# Patient Record
Sex: Male | Born: 1965 | Race: White | Hispanic: No | Marital: Married | Smoking: Never smoker
Health system: Southern US, Community
[De-identification: ages and names within clinical notes are randomized; demographics above are authoritative.]

## PROBLEM LIST (undated history)

## (undated) DIAGNOSIS — I5032 Chronic diastolic (congestive) heart failure: Secondary | ICD-10-CM

## (undated) DIAGNOSIS — E785 Hyperlipidemia, unspecified: Secondary | ICD-10-CM

## (undated) DIAGNOSIS — G629 Polyneuropathy, unspecified: Secondary | ICD-10-CM

## (undated) DIAGNOSIS — G4730 Sleep apnea, unspecified: Secondary | ICD-10-CM

## (undated) DIAGNOSIS — K7031 Alcoholic cirrhosis of liver with ascites: Principal | ICD-10-CM

## (undated) DIAGNOSIS — D649 Anemia, unspecified: Secondary | ICD-10-CM

## (undated) DIAGNOSIS — G4733 Obstructive sleep apnea (adult) (pediatric): Secondary | ICD-10-CM

## (undated) DIAGNOSIS — E119 Type 2 diabetes mellitus without complications: Secondary | ICD-10-CM

## (undated) DIAGNOSIS — T7840XA Allergy, unspecified, initial encounter: Secondary | ICD-10-CM

## (undated) DIAGNOSIS — D696 Thrombocytopenia, unspecified: Secondary | ICD-10-CM

## (undated) DIAGNOSIS — Z9989 Dependence on other enabling machines and devices: Secondary | ICD-10-CM

## (undated) DIAGNOSIS — N183 Chronic kidney disease, stage 3 (moderate): Secondary | ICD-10-CM

## (undated) DIAGNOSIS — L03116 Cellulitis of left lower limb: Secondary | ICD-10-CM

## (undated) DIAGNOSIS — F1020 Alcohol dependence, uncomplicated: Secondary | ICD-10-CM

## (undated) DIAGNOSIS — I10 Essential (primary) hypertension: Secondary | ICD-10-CM

## (undated) DIAGNOSIS — K219 Gastro-esophageal reflux disease without esophagitis: Secondary | ICD-10-CM

## (undated) HISTORY — DX: Thrombocytopenia, unspecified: D69.6

## (undated) HISTORY — DX: Allergy, unspecified, initial encounter: T78.40XA

## (undated) HISTORY — DX: Polyneuropathy, unspecified: G62.9

## (undated) HISTORY — DX: Dependence on other enabling machines and devices: Z99.89

## (undated) HISTORY — DX: Hyperlipidemia, unspecified: E78.5

## (undated) HISTORY — DX: Sleep apnea, unspecified: G47.30

## (undated) HISTORY — DX: Cellulitis of left lower limb: L03.116

## (undated) HISTORY — DX: Obstructive sleep apnea (adult) (pediatric): G47.33

## (undated) HISTORY — DX: Gastro-esophageal reflux disease without esophagitis: K21.9

## (undated) HISTORY — DX: Alcohol dependence, uncomplicated: F10.20

## (undated) HISTORY — DX: Chronic kidney disease, stage 3 (moderate): N18.3

## (undated) HISTORY — DX: Chronic diastolic (congestive) heart failure: I50.32

## (undated) HISTORY — DX: Essential (primary) hypertension: I10

## (undated) HISTORY — DX: Alcoholic cirrhosis of liver with ascites: K70.31

---

## 1995-06-28 HISTORY — PX: FINGER AMPUTATION: SHX636

## 1997-11-13 ENCOUNTER — Emergency Department (HOSPITAL_COMMUNITY): Admission: EM | Admit: 1997-11-13 | Discharge: 1997-11-13 | Payer: Self-pay | Admitting: Emergency Medicine

## 2003-08-26 ENCOUNTER — Encounter: Payer: Self-pay | Admitting: Internal Medicine

## 2004-08-18 ENCOUNTER — Ambulatory Visit: Payer: Self-pay | Admitting: Internal Medicine

## 2004-09-13 ENCOUNTER — Ambulatory Visit: Payer: Self-pay | Admitting: Internal Medicine

## 2004-12-14 ENCOUNTER — Ambulatory Visit: Payer: Self-pay | Admitting: Internal Medicine

## 2005-08-03 ENCOUNTER — Ambulatory Visit: Payer: Self-pay | Admitting: Internal Medicine

## 2006-01-16 ENCOUNTER — Ambulatory Visit: Payer: Self-pay | Admitting: Family Medicine

## 2006-02-20 ENCOUNTER — Ambulatory Visit: Payer: Self-pay | Admitting: Internal Medicine

## 2006-03-20 ENCOUNTER — Ambulatory Visit: Payer: Self-pay | Admitting: Internal Medicine

## 2006-06-22 ENCOUNTER — Ambulatory Visit: Payer: Self-pay | Admitting: Family Medicine

## 2006-07-19 ENCOUNTER — Ambulatory Visit: Payer: Self-pay | Admitting: Internal Medicine

## 2006-07-19 LAB — CONVERTED CEMR LAB
CO2: 30 meq/L (ref 19–32)
Calcium: 9.7 mg/dL (ref 8.4–10.5)
Chloride: 104 meq/L (ref 96–112)
Cholesterol: 272 mg/dL (ref 0–200)
Direct LDL: 174.4 mg/dL
GFR calc Af Amer: 87 mL/min
Glucose, Bld: 105 mg/dL — ABNORMAL HIGH (ref 70–99)
VLDL: 45 mg/dL — ABNORMAL HIGH (ref 0–40)

## 2006-08-06 ENCOUNTER — Emergency Department (HOSPITAL_COMMUNITY): Admission: EM | Admit: 2006-08-06 | Discharge: 2006-08-06 | Payer: Self-pay | Admitting: Emergency Medicine

## 2006-08-07 ENCOUNTER — Ambulatory Visit: Payer: Self-pay | Admitting: Internal Medicine

## 2006-09-19 ENCOUNTER — Ambulatory Visit: Payer: Self-pay | Admitting: Family Medicine

## 2006-10-03 ENCOUNTER — Ambulatory Visit: Payer: Self-pay | Admitting: Family Medicine

## 2006-10-12 ENCOUNTER — Ambulatory Visit: Payer: Self-pay | Admitting: Internal Medicine

## 2006-10-12 LAB — CONVERTED CEMR LAB
Albumin: 3.3 g/dL — ABNORMAL LOW (ref 3.5–5.2)
BUN: 11 mg/dL (ref 6–23)
Creatinine, Ser: 1.1 mg/dL (ref 0.4–1.5)
GFR calc Af Amer: 95 mL/min
GFR calc non Af Amer: 79 mL/min
Phosphorus: 3.2 mg/dL (ref 2.3–4.6)

## 2007-02-15 DIAGNOSIS — E1169 Type 2 diabetes mellitus with other specified complication: Secondary | ICD-10-CM | POA: Insufficient documentation

## 2007-02-15 DIAGNOSIS — J309 Allergic rhinitis, unspecified: Secondary | ICD-10-CM

## 2007-02-15 DIAGNOSIS — K219 Gastro-esophageal reflux disease without esophagitis: Secondary | ICD-10-CM | POA: Insufficient documentation

## 2007-02-15 DIAGNOSIS — E785 Hyperlipidemia, unspecified: Secondary | ICD-10-CM

## 2007-05-01 ENCOUNTER — Ambulatory Visit: Payer: Self-pay | Admitting: Internal Medicine

## 2007-05-01 DIAGNOSIS — F341 Dysthymic disorder: Secondary | ICD-10-CM

## 2007-05-01 DIAGNOSIS — L919 Hypertrophic disorder of the skin, unspecified: Secondary | ICD-10-CM

## 2007-05-01 DIAGNOSIS — L909 Atrophic disorder of skin, unspecified: Secondary | ICD-10-CM | POA: Insufficient documentation

## 2007-05-03 LAB — CONVERTED CEMR LAB
Albumin: 3.7 g/dL (ref 3.5–5.2)
Basophils Absolute: 0 10*3/uL (ref 0.0–0.1)
Cholesterol: 261 mg/dL (ref 0–200)
Eosinophils Absolute: 0.3 10*3/uL (ref 0.0–0.6)
GFR calc Af Amer: 95 mL/min
GFR calc non Af Amer: 79 mL/min
Glucose, Bld: 104 mg/dL — ABNORMAL HIGH (ref 70–99)
Hemoglobin: 15 g/dL (ref 13.0–17.0)
Lymphocytes Relative: 21.1 % (ref 12.0–46.0)
MCHC: 35.2 g/dL (ref 30.0–36.0)
MCV: 91.3 fL (ref 78.0–100.0)
Monocytes Absolute: 0.5 10*3/uL (ref 0.2–0.7)
Monocytes Relative: 8.9 % (ref 3.0–11.0)
Neutro Abs: 3.2 10*3/uL (ref 1.4–7.7)
Neutrophils Relative %: 64.1 % (ref 43.0–77.0)
Phosphorus: 3 mg/dL (ref 2.3–4.6)
Potassium: 5.1 meq/L (ref 3.5–5.1)
RDW: 12.1 % (ref 11.5–14.6)
Sodium: 138 meq/L (ref 135–145)
Total CHOL/HDL Ratio: 5.8

## 2007-07-31 ENCOUNTER — Ambulatory Visit: Payer: Self-pay | Admitting: Internal Medicine

## 2007-07-31 DIAGNOSIS — R740 Nonspecific elevation of levels of transaminase and lactic acid dehydrogenase [LDH]: Secondary | ICD-10-CM

## 2007-08-02 LAB — CONVERTED CEMR LAB
ALT: 94 units/L — ABNORMAL HIGH (ref 0–53)
AST: 57 units/L — ABNORMAL HIGH (ref 0–37)
BUN: 15 mg/dL (ref 6–23)
Bilirubin, Direct: 0.1 mg/dL (ref 0.0–0.3)
CO2: 27 meq/L (ref 19–32)
Creatinine, Ser: 1.3 mg/dL (ref 0.4–1.5)
GFR calc non Af Amer: 65 mL/min
HCV Ab: NEGATIVE
Hepatitis B Surface Ag: NEGATIVE
Phosphorus: 2.7 mg/dL (ref 2.3–4.6)
Potassium: 4.7 meq/L (ref 3.5–5.1)
Sodium: 137 meq/L (ref 135–145)
Total Bilirubin: 0.7 mg/dL (ref 0.3–1.2)

## 2007-10-22 ENCOUNTER — Ambulatory Visit: Payer: Self-pay | Admitting: Family Medicine

## 2008-04-09 ENCOUNTER — Ambulatory Visit: Payer: Self-pay | Admitting: Family Medicine

## 2008-04-09 DIAGNOSIS — F1010 Alcohol abuse, uncomplicated: Secondary | ICD-10-CM | POA: Insufficient documentation

## 2008-04-10 LAB — CONVERTED CEMR LAB
ALT: 98 units/L — ABNORMAL HIGH (ref 0–53)
AST: 61 units/L — ABNORMAL HIGH (ref 0–37)
Bilirubin, Direct: 0.2 mg/dL (ref 0.0–0.3)
CO2: 27 meq/L (ref 19–32)
Calcium: 9.3 mg/dL (ref 8.4–10.5)
Chloride: 104 meq/L (ref 96–112)
Glucose, Bld: 107 mg/dL — ABNORMAL HIGH (ref 70–99)
Sodium: 138 meq/L (ref 135–145)
Total Bilirubin: 0.8 mg/dL (ref 0.3–1.2)
Total Protein: 7.7 g/dL (ref 6.0–8.3)

## 2008-04-28 ENCOUNTER — Ambulatory Visit: Payer: Self-pay | Admitting: Internal Medicine

## 2008-07-24 ENCOUNTER — Telehealth: Payer: Self-pay | Admitting: Internal Medicine

## 2008-08-22 ENCOUNTER — Telehealth: Payer: Self-pay | Admitting: Internal Medicine

## 2008-08-29 ENCOUNTER — Ambulatory Visit: Payer: Self-pay | Admitting: Internal Medicine

## 2008-08-29 DIAGNOSIS — G479 Sleep disorder, unspecified: Secondary | ICD-10-CM | POA: Insufficient documentation

## 2008-09-15 ENCOUNTER — Telehealth: Payer: Self-pay | Admitting: Internal Medicine

## 2008-09-22 ENCOUNTER — Telehealth: Payer: Self-pay | Admitting: Internal Medicine

## 2008-09-22 ENCOUNTER — Ambulatory Visit: Payer: Self-pay | Admitting: Internal Medicine

## 2008-09-22 DIAGNOSIS — R600 Localized edema: Secondary | ICD-10-CM | POA: Insufficient documentation

## 2008-09-24 LAB — CONVERTED CEMR LAB
AST: 49 units/L — ABNORMAL HIGH (ref 0–37)
Albumin: 3.8 g/dL (ref 3.5–5.2)
Alkaline Phosphatase: 70 units/L (ref 39–117)
BUN: 13 mg/dL (ref 6–23)
Basophils Absolute: 0 10*3/uL (ref 0.0–0.1)
Bilirubin, Direct: 0.1 mg/dL (ref 0.0–0.3)
Calcium: 9.2 mg/dL (ref 8.4–10.5)
Creatinine, Ser: 1.2 mg/dL (ref 0.4–1.5)
Eosinophils Relative: 3.5 % (ref 0.0–5.0)
GFR calc non Af Amer: 70.56 mL/min (ref >60)
Glucose, Bld: 99 mg/dL (ref 70–99)
Lymphocytes Relative: 22.6 % (ref 12.0–46.0)
Lymphs Abs: 1.4 10*3/uL (ref 0.7–4.0)
Monocytes Relative: 8.1 % (ref 3.0–12.0)
Neutrophils Relative %: 65.4 % (ref 43.0–77.0)
Platelets: 122 10*3/uL — ABNORMAL LOW (ref 150.0–400.0)
RDW: 12.3 % (ref 11.5–14.6)
TSH: 1.28 microintl units/mL (ref 0.35–5.50)
Total Bilirubin: 0.7 mg/dL (ref 0.3–1.2)
WBC: 6.2 10*3/uL (ref 4.5–10.5)

## 2008-11-25 ENCOUNTER — Ambulatory Visit: Payer: Self-pay | Admitting: Internal Medicine

## 2008-11-25 DIAGNOSIS — M542 Cervicalgia: Secondary | ICD-10-CM

## 2008-11-25 DIAGNOSIS — F4389 Other reactions to severe stress: Secondary | ICD-10-CM | POA: Insufficient documentation

## 2008-11-25 DIAGNOSIS — F438 Other reactions to severe stress: Secondary | ICD-10-CM

## 2008-11-26 LAB — CONVERTED CEMR LAB
BUN: 9 mg/dL (ref 6–23)
CO2: 30 meq/L (ref 19–32)
Calcium: 9 mg/dL (ref 8.4–10.5)
Chloride: 106 meq/L (ref 96–112)

## 2009-05-26 ENCOUNTER — Encounter: Payer: Self-pay | Admitting: Internal Medicine

## 2009-05-27 ENCOUNTER — Ambulatory Visit: Payer: Self-pay | Admitting: Internal Medicine

## 2009-05-27 DIAGNOSIS — R079 Chest pain, unspecified: Secondary | ICD-10-CM

## 2009-05-27 DIAGNOSIS — G589 Mononeuropathy, unspecified: Secondary | ICD-10-CM | POA: Insufficient documentation

## 2009-05-27 LAB — CONVERTED CEMR LAB
Albumin: 3.9 g/dL (ref 3.5–5.2)
BUN: 11 mg/dL (ref 6–23)
Basophils Relative: 0.9 % (ref 0.0–3.0)
Calcium: 9.4 mg/dL (ref 8.4–10.5)
Chloride: 101 meq/L (ref 96–112)
Cholesterol: 194 mg/dL (ref 0–200)
Eosinophils Absolute: 0.2 10*3/uL (ref 0.0–0.7)
Hemoglobin: 15.6 g/dL (ref 13.0–17.0)
Lymphs Abs: 1.2 10*3/uL (ref 0.7–4.0)
MCHC: 34.5 g/dL (ref 30.0–36.0)
MCV: 92.2 fL (ref 78.0–100.0)
Monocytes Absolute: 0.4 10*3/uL (ref 0.1–1.0)
Neutro Abs: 4.2 10*3/uL (ref 1.4–7.7)
Potassium: 3.7 meq/L (ref 3.5–5.1)
Prolactin: 7.3 ng/mL
RBC: 4.9 M/uL (ref 4.22–5.81)
TSH: 1.1 microintl units/mL (ref 0.35–5.50)
Total Protein: 8.6 g/dL — ABNORMAL HIGH (ref 6.0–8.3)
Triglycerides: 108 mg/dL (ref 0.0–149.0)
VLDL: 21.6 mg/dL (ref 0.0–40.0)
Vitamin B-12: 303 pg/mL (ref 211–911)

## 2009-05-29 ENCOUNTER — Encounter: Admission: RE | Admit: 2009-05-29 | Discharge: 2009-05-29 | Payer: Self-pay | Admitting: Internal Medicine

## 2009-09-14 ENCOUNTER — Ambulatory Visit: Payer: Self-pay | Admitting: Internal Medicine

## 2009-10-02 ENCOUNTER — Ambulatory Visit: Payer: Self-pay | Admitting: Internal Medicine

## 2009-10-02 DIAGNOSIS — J019 Acute sinusitis, unspecified: Secondary | ICD-10-CM

## 2009-10-06 ENCOUNTER — Telehealth: Payer: Self-pay | Admitting: Internal Medicine

## 2009-10-13 ENCOUNTER — Encounter: Payer: Self-pay | Admitting: Internal Medicine

## 2010-03-24 ENCOUNTER — Ambulatory Visit: Payer: Self-pay | Admitting: Internal Medicine

## 2010-03-26 LAB — CONVERTED CEMR LAB
CO2: 32 meq/L (ref 19–32)
Chloride: 96 meq/L (ref 96–112)
Creatinine, Ser: 1.1 mg/dL (ref 0.4–1.5)
GFR calc non Af Amer: 79.12 mL/min (ref >60)
Potassium: 3.2 meq/L — ABNORMAL LOW (ref 3.5–5.1)
Sodium: 138 meq/L (ref 135–145)

## 2010-04-05 ENCOUNTER — Ambulatory Visit: Payer: Self-pay | Admitting: Internal Medicine

## 2010-04-06 LAB — CONVERTED CEMR LAB: Potassium: 3.4 meq/L — ABNORMAL LOW (ref 3.5–5.1)

## 2010-04-30 ENCOUNTER — Ambulatory Visit: Payer: Self-pay | Admitting: Internal Medicine

## 2010-05-03 LAB — CONVERTED CEMR LAB
BUN: 9 mg/dL (ref 6–23)
Chloride: 102 meq/L (ref 96–112)
GFR calc non Af Amer: 98.87 mL/min (ref >60)
Phosphorus: 3.2 mg/dL (ref 2.3–4.6)
Potassium: 3.6 meq/L (ref 3.5–5.1)

## 2010-07-27 NOTE — Assessment & Plan Note (Signed)
Summary: EAR PAIN/NT   Vital Signs:  Patient profile:   45 year old male Weight:      244 pounds Temp:     98.4 degrees F oral BP sitting:   122 / 80  (left arm) Cuff size:   large  Vitals Entered By: Mervin Hack CMA Duncan Dull) (October 02, 2009 1:09 PM) CC: right ear pain   History of Present Illness: Started with right ear ache yesterday upon awakening all day pounding yesterday Took advil all day  some easing last night Okay if he lied on that side Lots of popping and cracking hearing is off  Whole right side face is congested no fever slight chills and aches yesterday  No swimming Chronic sinus problems nose is constantly congested no sig PND or cough thick congestion  No history of pollen sensitivity but always stopped up  Allergies: 1)  Maxzide-25 (Triamterene-Hctz) 2)  Lisinopril (Lisinopril)  Past History:  Past medical, surgical, family and social histories (including risk factors) reviewed for relevance to current acute and chronic problems.  Past Medical History: Reviewed history from 04/09/2008 and no changes required. Allergic rhinitis GERD Hyperlipidemia Hypertension Heavy alcohol use, 6-12 beer daily  Family History: Reviewed history from 11/25/2008 and no changes required. Father: HTN, CAD (MI) Mother: Died CVA Siblings:  Mat. Aunt deceased, cancer- ? lung cancer ETOH Abuse - extended family No CAD or DM  Social History: Reviewed history from 04/09/2008 and no changes required. Marital Status: Married Children: 2 Occupation: Public affairs consultant Never Smoked Dips Alcohol use-heavy  Review of Systems       No vomiting or diarrhea  Physical Exam  General:  alert.  NAD Head:  no frontal or maxillary tenderness Ears:  L ear normal.   Right TM is opaque--mild injection along medial border Nose:  marked inflammation on both sides Mouth:  no erythema and no exudates.   Neck:  supple, no masses, and no cervical lymphadenopathy.     Lungs:  normal respiratory effort and normal breath sounds.     Impression & Recommendations:  Problem # 1:  SINUSITIS - ACUTE-NOS (ICD-461.9) Assessment New  with secondary OM will treat with augmentin continue the advil  His updated medication list for this problem includes:    Amoxicillin-pot Clavulanate 875-125 Mg Tabs (Amoxicillin-pot clavulanate) .Marland Kitchen... 1 tab by mouth two times a day after eating for sinus and ear infection  Complete Medication List: 1)  Lorazepam 0.5 Mg Tabs (Lorazepam) .... Take 1/2-1 tablet by mouth twice a day 2)  Metoprolol Tartrate 25 Mg Tabs (Metoprolol tartrate) .Marland Kitchen.. 1 two times a day 3)  Amlodipine Besylate 10 Mg Tabs (Amlodipine besylate) .Marland Kitchen.. 1 tab daily 4)  Doxazosin Mesylate 4 Mg Tabs (Doxazosin mesylate) .Marland Kitchen.. 1 tab daily. 5)  Furosemide 80 Mg Tabs (Furosemide) .... 1/2 - 1 daily as needed for leg swelling 6)  Gabapentin 300 Mg Caps (Gabapentin) .Marland Kitchen.. 1-2 caps at bedtime to help leg burning and sleep 7)  Amoxicillin-pot Clavulanate 875-125 Mg Tabs (Amoxicillin-pot clavulanate) .Marland Kitchen.. 1 tab by mouth two times a day after eating for sinus and ear infection  Patient Instructions: 1)  Please keep regular appt 2)  call if not improving by next week Prescriptions: AMOXICILLIN-POT CLAVULANATE 875-125 MG TABS (AMOXICILLIN-POT CLAVULANATE) 1 tab by mouth two times a day after eating for sinus and ear infection  #20 x 0   Entered and Authorized by:   Cindee Salt MD   Signed by:   Cindee Salt  MD on 10/02/2009   Method used:   Electronically to        [facility] [phone]* (retail)       [address]       [place], [place]  [address]       Ph: [phone]       Fax: [phone]   RxID:   [id number]   Current Allergies (reviewed today): MAXZIDE-25 (TRIAMTERENE-HCTZ) LISINOPRIL (LISINOPRIL)

## 2010-07-27 NOTE — Consult Note (Signed)
Summary: [facility]  [facility]   Imported By: Lanelle Bal 10/20/2009 09:05:15  _____________________________________________________________________  External Attachment:    Type:   Image     Comment:   External Document  Appended Document: [facility] serous OM conservative Rx with 1 month follow up

## 2010-07-27 NOTE — Assessment & Plan Note (Signed)
Summary: F/U DLO   Vital Signs:  Patient profile:   45 year old male Weight:      248 pounds BMI:     38.98 O2 Sat:      92 % on Room air Temp:     98 degrees F oral Pulse rate:   92 / minute Pulse rhythm:   regular BP sitting:   130 / 80  (left arm) Cuff size:   large  Vitals Entered By: Mervin Hack CMA Duncan Dull) (September 14, 2009 12:07 PM)  O2 Flow:  Room air CC: trouble sleeping and breathing   History of Present Illness: Still having swelling--more on left than right takes the fluid pill daiy but cut the 80mg  in half feels dehydrated--never sweats wears compression socks when he knows he will be on them all day often gets foot pain when he wears them for any extended time  Not sleeping well--initates fine  ~10PM then awake at 2AM and tosses and turns Has cut back on alcohol Gets fatigued after a couple of hours during the day  trying to exercise as much as his heel allows him Fine in AM Pain comes on after a couple of hours  Leg pain has not been that troubling so not taking the gabapentin regularly  spring allergies have been worsening yearly very stopped up now  feels it in his ears  Allergies: 1)  Maxzide-25 (Triamterene-Hctz) 2)  Lisinopril (Lisinopril)  Past History:  Past medical, surgical, family and social histories (including risk factors) reviewed for relevance to current acute and chronic problems.  Past Medical History: Reviewed history from 04/09/2008 and no changes required. Allergic rhinitis GERD Hyperlipidemia Hypertension Heavy alcohol use, 6-12 beer daily  Family History: Reviewed history from 11/25/2008 and no changes required. Father: HTN, CAD (MI) Mother: Died CVA Siblings:  Mat. Aunt deceased, cancer- ? lung cancer ETOH Abuse - extended family No CAD or DM  Social History: Reviewed history from 04/09/2008 and no changes required. 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   Head:  no sinus tenderness Ears:  R ear normal and L ear normal.   Nose:  mild pale congestion Mouth:  no erythema and no exudates.   Neck:  supple, no masses, no thyromegaly, no carotid bruits, and no cervical lymphadenopathy.   Lungs:  normal respiratory effort, normal breath sounds, no crackles, and no wheezes.   Heart:  normal rate, regular rhythm, no murmur, and no gallop.   Extremities:  3+ tense edema on left 2+ on right Psych:  normally interactive, good eye contact, not anxious appearing, and not depressed appearing.     Impression & Recommendations:  Problem # 1:  NEUROPATHY (ICD-355.9) Assessment Improved discussed that he should use the gabapentin regularly to see if it helps with sleep  Problem # 2:  EDEMA (ICD-782.3) Assessment: Deteriorated weight up some needs to change to salt substitute may need the full lasix daily  His updated medication list for this problem includes:    Furosemide 80 Mg Tabs (Furosemide) .Marland Kitchen... 1/2 - 1 daily as needed for leg swelling  Problem # 3:  ALLERGIC RHINITIS (ICD-477.9) Assessment: Unchanged discussed OTC meds  Complete Medication List: 1)  Lorazepam 0.5 Mg Tabs (Lorazepam) .... Take 1/2-1 tablet by mouth twice a day 2)  Metoprolol Tartrate 25 Mg Tabs (Metoprolol tartrate) .Marland Kitchen.. 1 two times a day 3)  Amlodipine Besylate 10  Mg Tabs (Amlodipine besylate) .Marland Kitchen.. 1 tab daily 4)  Doxazosin Mesylate 4 Mg Tabs (Doxazosin mesylate) .Marland Kitchen.. 1 tab daily. 5)  Furosemide 80 Mg Tabs (Furosemide) .... 1/2 - 1 daily as needed for leg swelling 6)  Gabapentin 300 Mg Caps (Gabapentin) .Marland Kitchen.. 1-2 caps at bedtime to help leg burning and sleep  Patient Instructions: 1)  Please schedule a follow-up appointment in 6 months .  2)  Please try the gabapentin regularly at night to help sleep 3)  Please get rid of regular salt and change to salt substitute (no-salt) 4)   You probably need the full furosemide daily 5)  Please try loratadine 10mg  1-2 daily or cetirizine 10mg  daily for allergy symptoms  Current Allergies (reviewed today): MAXZIDE-25 (TRIAMTERENE-HCTZ) LISINOPRIL (LISINOPRIL)

## 2010-07-27 NOTE — Progress Notes (Signed)
Summary: ear is not much better  Phone Note Call from Patient Call back at [phone]   Caller: Patient Call For: Colton Salt MD Summary of Call: Pt was seen on 4/8 for an ear infection.  He has been taking abx.  Still completly stopped up on right side, outer ear is hurting- pain is worse at night.   Constant ticking sound in ear.  He was told to call if not better.  Uses [facility] [address]. Initial call taken by: Lowella Petties CMA,  October 06, 2009 9:59 AM  Follow-up for Phone Call        already on pretty powerful medication  I think it would be best to have an ENT specialist take a look Follow-up by: Colton Salt MD,  October 06, 2009 12:09 PM  Additional Follow-up for Phone Call Additional follow up Details #1::        [facility] with Odie Sera. on 10/13/2009 at 10:20am. [name]  October 07, 2009 3:33 PM  Additional Follow-up by: [name],  October 07, 2009 3:33 PM

## 2010-07-27 NOTE — Assessment & Plan Note (Signed)
Summary: 8:00 APPT 6 MONTH FOLLOW UPR/BH   Vital Signs:  Patient profile:   45 year old male Weight:      248 pounds Temp:     98.4 degrees F oral Pulse rate:   76 / minute Pulse rhythm:   regular BP sitting:   148 / 90  (left arm) Cuff size:   large  Vitals Entered By: Mervin Hack CMA Duncan Dull) (March 24, 2010 8:12 AM) CC: 6 month follow-up, Depression   History of Present Illness: Doing okay "hanging in there"  Saw ENT --serous OM finally did get better  Still with leg swelling but seems some better Notes dark discoloration  Still has pain in toes gabapentin seemed to help him sleep for only  ~3 hours Only using 300mg  though Remus Loffler keeps him asleep but afraid to use it daily---uses wife's 10mg  occ  Doesn't generally check BP  Niece may check generally  ~140s/90 Having lots of stress now Occ gets symptoms  when wlaking---noted DOE if he walked too fast does okay at a lower rate  Mood has been better of late   Allergies: 1)  Maxzide-25 (Triamterene-Hctz) 2)  Lisinopril (Lisinopril)  Past History:  Past medical, surgical, family and social histories (including risk factors) reviewed for relevance to current acute and chronic problems.  Past Medical History: Allergic rhinitis GERD Hyperlipidemia Hypertension Neuropathy  Family History: Reviewed history from 11/25/2008 and no changes required. Father: HTN, CAD (MI) Mother: Died CVA Siblings:  Mat. Aunt deceased, cancer- ? lung cancer ETOH Abuse - extended family No CAD or DM  Social History: Reviewed history from 04/09/2008 and no changes required. 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 Persists weight up a few pounds  Physical Exam  General:  alert and normal appearance.   Neck:  supple, no masses, no  thyromegaly, no carotid bruits, and no cervical lymphadenopathy.   Lungs:  normal respiratory effort, no intercostal retractions, no accessory muscle use, and normal breath sounds.   Heart:  normal rate, regular rhythm, no murmur, and no gallop.   Abdomen:  soft and non-tender.   Msk:  no joint tenderness and no joint swelling.   Extremities:  thick calves without pitting Skin:  venous stasis changes to knees bilaterally   ~3x3cm spongy mass consistent with lipoma on upper medial left thigh Psych:  normally interactive, good eye contact, not anxious appearing, and not depressed appearing.     Impression & Recommendations:  Problem # 1:  NEUROPATHY (ICD-355.9) Assessment Unchanged ongoing asked him to increase the gabapentin to 2-3 at bedtime try a multivitamin again  Problem # 2:  EDEMA (ICD-782.3) Assessment: Unchanged  does okay with the diuretic daily will check labs  His updated medication list for this problem includes:    Furosemide 80 Mg Tabs (Furosemide) .Marland Kitchen... 1/2 - 1 daily as needed for leg swelling  Orders: TLB-Renal Function Panel (80069-RENAL) Venipuncture (25366)  Problem # 3:  HYPERTENSION (ICD-401.9) Assessment: Unchanged up some today generally acceptable levels no changes  His updated medication list for this problem includes:    Metoprolol Tartrate 25 Mg Tabs (Metoprolol tartrate) .Marland Kitchen... 1 two times a day    Amlodipine Besylate 10 Mg Tabs (Amlodipine besylate) .Marland Kitchen... 1 tab daily    Doxazosin Mesylate 4 Mg Tabs (Doxazosin mesylate) .Marland Kitchen... 1 tab  daily.    Furosemide 80 Mg Tabs (Furosemide) .Marland Kitchen... 1/2 - 1 daily as needed for leg swelling  BP today: 148/90 Prior BP: 122/80 (10/02/2009)  Labs Reviewed: K+: 3.7 (05/27/2009) Creat: : 1.1 (05/27/2009)   Chol: 194 (05/27/2009)   HDL: 49.20 (05/27/2009)   LDL: 123 (05/27/2009)   TG: 108.0 (05/27/2009)  Problem # 4:  DYSTHYMIA (ICD-300.4) Assessment: Improved doing better with mood  Complete Medication  List: 1)  Lorazepam 0.5 Mg Tabs (Lorazepam) .... Take 1/2-1 tablet by mouth twice a day 2)  Metoprolol Tartrate 25 Mg Tabs (Metoprolol tartrate) .Marland Kitchen.. 1 two times a day 3)  Amlodipine Besylate 10 Mg Tabs (Amlodipine besylate) .Marland Kitchen.. 1 tab daily 4)  Doxazosin Mesylate 4 Mg Tabs (Doxazosin mesylate) .Marland Kitchen.. 1 tab daily. 5)  Furosemide 80 Mg Tabs (Furosemide) .... 1/2 - 1 daily as needed for leg swelling 6)  Gabapentin 300 Mg Caps (Gabapentin) .... 2-3  caps at bedtime to help leg burning and sleep  Patient Instructions: 1)  Please increase to 2 or even 3 gabapentin at bedtime to help legs 2)  Please schedule a follow-up appointment in 6 months for physical  Current Allergies (reviewed today): MAXZIDE-25 (TRIAMTERENE-HCTZ) LISINOPRIL (LISINOPRIL)  Appended Document: 8:00 APPT 6 MONTH FOLLOW UPR/BH    Clinical Lists Changes  Orders: Added new Service order of Flu Vaccine 62yrs + [phone]) - Signed Added new Service order of Admin 1st Vaccine (60454) - Signed Added new Service order of Admin 1st Vaccine [facility]) [phone]) - Signed Observations: Added new observation of FLU VAX#1VIS: 01/19/10 version given March 24, 2010. (03/24/2010 8:54) Added new observation of FLU VAXLOT: [id number] (03/24/2010 8:54) Added new observation of FLU VAX EXP: 12/25/2010 (03/24/2010 8:54) Added new observation of FLU VAXBY: DeShannon Smith CMA (AAMA) (03/24/2010 8:54) Added new observation of FLU VAXRTE: IM (03/24/2010 8:54) Added new observation of FLU VAX DSE: 0.5 ml (03/24/2010 8:54) Added new observation of FLU VAXMFR: GlaxoSmith[employer] (03/24/2010 8:54) Added new observation of FLU VAX SITE: left deltoid (03/24/2010 8:54) Added new observation of FLU VAX: Fluvax 3+ (03/24/2010 8:54)       Influenza Vaccine    Vaccine Type: Fluvax 3+    Site: left deltoid    Mfr: GlaxoSmithKline    Dose: 0.5 ml    Route: IM    Given by: Mervin Hack CMA (AAMA)    Exp. Date: 12/25/2010    Lot #: [id number]     VIS given: 01/19/10 version given March 24, 2010.  Flu Vaccine Consent Questions    Do you have a history of severe allergic reactions to this vaccine? no    Any prior history of allergic reactions to egg and/or gelatin? no    Do you have a sensitivity to the preservative Thimersol? no    Do you have a past history of Guillan-Barre Syndrome? no    Do you currently have an acute febrile illness? no    Have you ever had a severe reaction to latex? no    Vaccine information given and explained to patient? yes

## 2010-08-05 ENCOUNTER — Encounter: Payer: Self-pay | Admitting: Internal Medicine

## 2010-09-21 ENCOUNTER — Other Ambulatory Visit: Payer: Self-pay | Admitting: Internal Medicine

## 2010-09-22 ENCOUNTER — Ambulatory Visit (INDEPENDENT_AMBULATORY_CARE_PROVIDER_SITE_OTHER): Payer: Self-pay | Admitting: Internal Medicine

## 2010-09-22 ENCOUNTER — Encounter: Payer: Self-pay | Admitting: Internal Medicine

## 2010-09-22 VITALS — BP 122/78 | HR 71 | Temp 98.6°F | Ht 67.5 in | Wt 255.0 lb

## 2010-09-22 DIAGNOSIS — E785 Hyperlipidemia, unspecified: Secondary | ICD-10-CM

## 2010-09-22 DIAGNOSIS — R609 Edema, unspecified: Secondary | ICD-10-CM

## 2010-09-22 DIAGNOSIS — K219 Gastro-esophageal reflux disease without esophagitis: Secondary | ICD-10-CM

## 2010-09-22 DIAGNOSIS — B356 Tinea cruris: Secondary | ICD-10-CM

## 2010-09-22 DIAGNOSIS — F341 Dysthymic disorder: Secondary | ICD-10-CM

## 2010-09-22 DIAGNOSIS — I10 Essential (primary) hypertension: Secondary | ICD-10-CM

## 2010-09-22 DIAGNOSIS — G589 Mononeuropathy, unspecified: Secondary | ICD-10-CM

## 2010-09-22 DIAGNOSIS — Z0000 Encounter for general adult medical examination without abnormal findings: Secondary | ICD-10-CM

## 2010-09-22 LAB — LDL CHOLESTEROL, DIRECT: Direct LDL: 161.6 mg/dL

## 2010-09-22 LAB — HEPATIC FUNCTION PANEL
ALT: 51 U/L (ref 0–53)
Bilirubin, Direct: 0.2 mg/dL (ref 0.0–0.3)
Total Bilirubin: 0.7 mg/dL (ref 0.3–1.2)

## 2010-09-22 LAB — CBC WITH DIFFERENTIAL/PLATELET
Basophils Relative: 0.5 % (ref 0.0–3.0)
Eosinophils Relative: 4.5 % (ref 0.0–5.0)
HCT: 43 % (ref 39.0–52.0)
Hemoglobin: 14.9 g/dL (ref 13.0–17.0)
Lymphs Abs: 1.2 10*3/uL (ref 0.7–4.0)
MCV: 92.7 fl (ref 78.0–100.0)
Monocytes Absolute: 0.5 10*3/uL (ref 0.1–1.0)
Neutro Abs: 4 10*3/uL (ref 1.4–7.7)
RBC: 4.65 Mil/uL (ref 4.22–5.81)
WBC: 5.9 10*3/uL (ref 4.5–10.5)

## 2010-09-22 LAB — BASIC METABOLIC PANEL
Chloride: 98 mEq/L (ref 96–112)
Creatinine, Ser: 1.1 mg/dL (ref 0.4–1.5)

## 2010-09-22 LAB — LIPID PANEL: Total CHOL/HDL Ratio: 5

## 2010-09-22 MED ORDER — KETOCONAZOLE-HYDROCORTISONE 2 & 1 % (CREAM) EX KIT: 1.0000 g | PACK | Freq: Two times a day (BID) | CUTANEOUS | Status: DC

## 2010-09-22 MED ORDER — AMLODIPINE BESYLATE 5 MG PO TABS: 5.0000 mg | ORAL_TABLET | Freq: Every day | ORAL | Status: DC

## 2010-09-22 NOTE — Patient Instructions (Addendum)
Okay to try melatonin at bedtime to help sleep. Make sure you don't have alcohol within 3-4 hours of trying to go to sleep. Please cut the amlodipine in half and only take 5mg . When you run out of the 10mg --start the new prescription Please take ranitidine nightly to prevent heartburn

## 2010-09-22 NOTE — Progress Notes (Signed)
Subjective:    Patient ID: Colton Porter, male    DOB: [birth year], 45 y.o.   MRN: [id number]  HPI DOing fair Still stays tired all the time--nothing has really changed Sleep is disjointed Has cut alcohol down to 4-6 beers daily Has tried to eat better--frustrated that weight is up  Leg swelling persists despite daily lasix Variable voiding No ulcers Has cut back on salt  Past Medical History  Diagnosis Date  . Allergy   . GERD (gastroesophageal reflux disease)   . Hyperlipidemia   . Hypertension   . Neuropathy     No past surgical history on file.  Family History  Problem Relation Age of Onset  . Stroke Mother   . Hypertension Father   . Heart disease Father   . Cancer Maternal Aunt     Lung    History   Social History  . Marital Status: Married    Spouse Name: N/A    Number of Children: 2  . Years of Education: N/A   Occupational History  . [employer]    Social History Main Topics  . Smoking status: Never Smoker   . Smokeless tobacco: Current User    Types: Chew  . Alcohol Use: Yes  . Drug Use: Not on file  . Sexually Active: Not on file   Other Topics Concern  . Not on file   Social History Narrative   DipsHeavy alcohol        Review of Systems  Constitutional: Positive for fatigue and unexpected weight change. Negative for appetite change.       Has been cutting down on the dip Weight up and he doesn't understand that  HENT: Positive for congestion, rhinorrhea, dental problem, postnasal drip and tinnitus. Negative for hearing loss.        Using loratadine prn  Rare tinnitus Uses ear protection in shop Needs some dental work  Eyes: Positive for itching. Negative for visual disturbance.       Watery eyes also No vision loss Just needs weak reading glasses  Respiratory: Positive for choking. Negative for shortness of breath.        Choked on candy in car last week Having sig pleuritic pain along right flank since then Some  little tickle cough  Cardiovascular: Positive for leg swelling. Negative for chest pain and palpitations.  Gastrointestinal: Negative for abdominal pain, constipation and blood in stool.       Chronic indigestion--takes tums most days--often at 3AM  Genitourinary: Positive for decreased urine volume and difficulty urinating. Negative for urgency.       Libido down Some ED--"scared" of viagra and those meds Variable urine output  Musculoskeletal: Positive for back pain and arthralgias.  Skin: Positive for rash.       No suspicious lesions Still with itching and rash in groin--just about the only place he sweats--does try powder. Lotrimin helped some  Neurological: Positive for numbness and headaches. Negative for dizziness, syncope and weakness.       Headaches now with the pollen Inconsistent with gabapentin--uses prn but doesn't help sleep. No sig burning pain now  Psychiatric/Behavioral: Positive for sleep disturbance and dysphoric mood. The patient is not nervous/anxious.        Occ down mood--relates some to ED Libido is down also No persistent depression Lorazepam didn't seem to help sleep in the past---did help panic attack about a year ago when he took 2       Objective:  Physical Exam  Constitutional: He appears well-developed and well-nourished. No distress.  HENT:  Right Ear: External ear normal.  Left Ear: External ear normal.  Mouth/Throat: Oropharynx is clear and moist. No oropharyngeal exudate.       TMs normal  Eyes: Conjunctivae and EOM are normal. Pupils are equal, round, and reactive to light. Right eye exhibits no discharge. Left eye exhibits no discharge.       Fundi benign  Neck: Normal range of motion. Neck supple. No thyromegaly present.  Cardiovascular: Normal rate, regular rhythm, normal heart sounds and intact distal pulses.  Exam reveals no gallop.   No murmur heard. Pulmonary/Chest: Effort normal and breath sounds normal. No respiratory distress. He  has no wheezes. He has no rales.  Abdominal: Soft. He exhibits no distension and no mass. There is no tenderness.  Musculoskeletal: Normal range of motion. He exhibits edema. He exhibits no tenderness.       Thick calves without pitting  Lymphadenopathy:    He has no cervical adenopathy.    He has axillary adenopathy.  Neurological: He is alert. No cranial nerve deficit.       Normal strength  Skin: Skin is warm. No erythema.       Venous stasis changes in left foot and both calves Hypertrophic skin area on upper left thigh--from rubbing of scrotum?  Psychiatric: He has a normal mood and affect. Judgment and thought content normal.          Assessment & Plan:

## 2010-09-23 ENCOUNTER — Telehealth: Payer: Self-pay | Admitting: *Deleted

## 2010-09-23 NOTE — Telephone Encounter (Signed)
Results put on phone tree

## 2010-09-23 NOTE — Telephone Encounter (Signed)
Message copied by Mervin Hack on Thu Sep 23, 2010  8:20 AM ------      Message from: Tillman Abide      Created: Wed Sep 22, 2010  9:06 PM       Please leave message on phone tree that his labs are fine      I accidentally signed without annotating and I couldn't get it back (I think it was his)            Chol moderately up with total of 234 and LDL or bad chol of 161      Blood count, blood sugar, liver, kidney and thyroid all normal            Convert this into a note for out records

## 2011-03-27 ENCOUNTER — Other Ambulatory Visit: Payer: Self-pay | Admitting: Internal Medicine

## 2011-03-29 ENCOUNTER — Ambulatory Visit: Payer: Self-pay | Admitting: Internal Medicine

## 2011-04-20 ENCOUNTER — Ambulatory Visit: Payer: Self-pay | Admitting: Internal Medicine

## 2011-04-26 ENCOUNTER — Encounter: Payer: Self-pay | Admitting: Internal Medicine

## 2011-04-26 ENCOUNTER — Ambulatory Visit (INDEPENDENT_AMBULATORY_CARE_PROVIDER_SITE_OTHER): Payer: Self-pay | Admitting: Internal Medicine

## 2011-04-26 DIAGNOSIS — I10 Essential (primary) hypertension: Secondary | ICD-10-CM

## 2011-04-26 DIAGNOSIS — G479 Sleep disorder, unspecified: Secondary | ICD-10-CM

## 2011-04-26 MED ORDER — KETOCONAZOLE-HYDROCORTISONE 2 & 1 % (CREAM) EX KIT: 1.0000 g | PACK | Freq: Two times a day (BID) | CUTANEOUS | Status: DC

## 2011-04-26 MED ORDER — ZOLPIDEM TARTRATE 10 MG PO TABS: 10.0000 mg | ORAL_TABLET | Freq: Every evening | ORAL | Status: AC | PRN

## 2011-04-26 NOTE — Assessment & Plan Note (Signed)
BP Readings from Last 3 Encounters:  04/26/11 130/80  09/22/10 122/78  03/24/10 148/90   Still okay Will try off the amlodipine Lasix prn only

## 2011-04-26 NOTE — Assessment & Plan Note (Signed)
Will give small dose of zolpidem

## 2011-04-26 NOTE — Progress Notes (Signed)
Subjective:    Patient ID: Colton Porter, male    DOB: 1966-01-31, 45 y.o.   MRN: [id number]  HPI Doing well in general  Groin rash got worse Actually more inflamed--cortisone cream OTC really helped Never tried the ketoconazole cream but did use lotrimin  Feels the edema is better on lower dose of amlodipine Still uses the furosemide--"it makes me feel lighter in the morning" Doesn't check BP No headaches except occ sinus pressure No chest pain Some DOE with walking---trying to increase how much he does  Current Outpatient Prescriptions on File Prior to Visit  Medication Sig Dispense Refill  . amLODipine (NORVASC) 5 MG tablet Take 1 tablet (5 mg total) by mouth daily.  30 tablet  11  . doxazosin (CARDURA) 4 MG tablet Take 4 mg by mouth daily.        . furosemide (LASIX) 80 MG tablet TAKE ONE TABLET BY MOUTH EVERY DAY AS NEEDED FOR LEG SWELLING  90 tablet  0  . gabapentin (NEURONTIN) 300 MG capsule Take 2-3 tablets at bedtime to help with leg burning and sleep       . LORazepam (ATIVAN) 0.5 MG tablet Take 1/2 to 1 tablet by mouth twice a day       . metoprolol (LOPRESSOR) 25 MG tablet Take 25 mg by mouth 2 (two) times daily.        . potassium chloride (KLOR-CON) 20 MEQ packet Take 20 mEq by mouth daily.        . ranitidine (ZANTAC) 150 MG tablet Take 150 mg by mouth at bedtime.          Allergies  Allergen Reactions  . Hydrochlorothiazide W/Triamterene     REACTION: dizzy, nausea  . Lisinopril     REACTION: cough, decreased libido    Past Medical History  Diagnosis Date  . Allergy   . GERD (gastroesophageal reflux disease)   . Hyperlipidemia   . Hypertension   . Neuropathy     No past surgical history on file.  Family History  Problem Relation Age of Onset  . Stroke Mother   . Hypertension Father   . Heart disease Father   . Cancer Maternal Aunt     Lung    History   Social History  . Marital Status: Married    Spouse Name: N/A    Number of Children:  2  . Years of Education: N/A   Occupational History  . [employer]    Social History Main Topics  . Smoking status: Never Smoker   . Smokeless tobacco: Current User    Types: Chew   Comment: has been trying to cut back  . Alcohol Use: Yes  . Drug Use: Not on file  . Sexually Active: Not on file   Other Topics Concern  . Not on file   Social History Narrative   DipsHeavy alcohol   Review of Systems Sweats less than in past in general Has cut down on beer Sleeps terrible still---melatonin didn't seem to work. Only thing that has helped is ambien    Objective:   Physical Exam  Constitutional: He appears well-developed and well-nourished. No distress.  Neck: Normal range of motion. No thyromegaly present.  Cardiovascular: Normal rate, regular rhythm and normal heart sounds.  Exam reveals no gallop.   No murmur heard. Pulmonary/Chest: Effort normal and breath sounds normal. No respiratory distress. He has no wheezes. He has no rales.  Musculoskeletal: He exhibits no edema and no  tenderness.  Lymphadenopathy:    He has no cervical adenopathy.  Psychiatric: He has a normal mood and affect. His behavior is normal. Judgment and thought content normal.          Assessment & Plan:

## 2011-04-26 NOTE — Patient Instructions (Signed)
Please stop the amlodipine Try to use the furosemide only if you have increased leg swelling Use the zolpidem Remus Loffler) sparingly

## 2011-05-29 ENCOUNTER — Other Ambulatory Visit: Payer: Self-pay | Admitting: Internal Medicine

## 2011-06-11 ENCOUNTER — Other Ambulatory Visit: Payer: Self-pay | Admitting: Internal Medicine

## 2011-10-14 ENCOUNTER — Other Ambulatory Visit: Payer: Self-pay | Admitting: Internal Medicine

## 2011-11-16 ENCOUNTER — Ambulatory Visit (INDEPENDENT_AMBULATORY_CARE_PROVIDER_SITE_OTHER): Payer: Self-pay | Admitting: Internal Medicine

## 2011-11-16 ENCOUNTER — Encounter: Payer: Self-pay | Admitting: Internal Medicine

## 2011-11-16 VITALS — BP 138/88 | HR 79 | Temp 98.4°F | Ht 67.0 in | Wt 248.0 lb

## 2011-11-16 DIAGNOSIS — Z6841 Body Mass Index (BMI) 40.0 and over, adult: Secondary | ICD-10-CM | POA: Insufficient documentation

## 2011-11-16 DIAGNOSIS — Z0000 Encounter for general adult medical examination without abnormal findings: Secondary | ICD-10-CM

## 2011-11-16 DIAGNOSIS — E785 Hyperlipidemia, unspecified: Secondary | ICD-10-CM

## 2011-11-16 DIAGNOSIS — E669 Obesity, unspecified: Secondary | ICD-10-CM

## 2011-11-16 DIAGNOSIS — I10 Essential (primary) hypertension: Secondary | ICD-10-CM

## 2011-11-16 LAB — BASIC METABOLIC PANEL
BUN: 8 mg/dL (ref 6–23)
CO2: 25 mEq/L (ref 19–32)
Calcium: 8.9 mg/dL (ref 8.4–10.5)
GFR: 91.05 mL/min (ref >60.00)
Glucose, Bld: 94 mg/dL (ref 70–99)

## 2011-11-16 LAB — CBC WITH DIFFERENTIAL/PLATELET
Basophils Absolute: 0 10*3/uL (ref 0.0–0.1)
Eosinophils Absolute: 0.1 10*3/uL (ref 0.0–0.7)
HCT: 42.4 % (ref 39.0–52.0)
Lymphocytes Relative: 23.6 % (ref 12.0–46.0)
Lymphs Abs: 1 10*3/uL (ref 0.7–4.0)
MCHC: 33.5 g/dL (ref 30.0–36.0)
Monocytes Relative: 8.7 % (ref 3.0–12.0)
Platelets: 66 10*3/uL — ABNORMAL LOW (ref 150.0–400.0)
RDW: 13 % (ref 11.5–14.6)

## 2011-11-16 LAB — TSH: TSH: 1.08 u[IU]/mL (ref 0.35–5.50)

## 2011-11-16 LAB — HEPATIC FUNCTION PANEL
Albumin: 3.5 g/dL (ref 3.5–5.2)
Alkaline Phosphatase: 79 U/L (ref 39–117)

## 2011-11-16 LAB — LIPID PANEL
HDL: 47.3 mg/dL (ref >39.00)
Triglycerides: 242 mg/dL — ABNORMAL HIGH (ref 0.0–149.0)

## 2011-11-16 MED ORDER — DOXAZOSIN MESYLATE 4 MG PO TABS: 4.0000 mg | ORAL_TABLET | Freq: Every day | ORAL | Status: DC

## 2011-11-16 NOTE — Assessment & Plan Note (Signed)
Will recheck Discussed lifestyle measures No meds now after discussion

## 2011-11-16 NOTE — Progress Notes (Signed)
Subjective:    Patient ID: Colton Porter, male    DOB: June 26, 1966, 46 y.o.   MRN: [id number]  HPI Here for physical Feels pretty good No new concerns  Doesn't check BP Feels well though  No regular exercise--does walk at times physically active at [facility] it has been sparse  Has cut back on beers---now about 6 per day Never drives then No black outs  Current Outpatient Prescriptions on File Prior to Visit  Medication Sig Dispense Refill  . metoprolol tartrate (LOPRESSOR) 25 MG tablet TAKE ONE TABLET BY MOUTH TWICE DAILY  60 tablet  11  . DISCONTD: doxazosin (CARDURA) 4 MG tablet TAKE 1 TABLET BY MOUTH EVERY DAY  30 tablet  2    Allergies  Allergen Reactions  . Hydrochlorothiazide W-Triamterene     REACTION: dizzy, nausea  . Lisinopril     REACTION: cough, decreased libido    Past Medical History  Diagnosis Date  . Allergy   . GERD (gastroesophageal reflux disease)   . Hyperlipidemia   . Hypertension   . Neuropathy     No past surgical history on file.  Family History  Problem Relation Age of Onset  . Stroke Mother   . Hypertension Father   . Heart disease Father   . Cancer Maternal Aunt     Lung    History   Social History  . Marital Status: Married    Spouse Name: N/A    Number of Children: 2  . Years of Education: N/A   Occupational History  . [employer]    Social History Main Topics  . Smoking status: Never Smoker   . Smokeless tobacco: Current User    Types: Chew   Comment: has been trying to cut back  . Alcohol Use: Yes  . Drug Use: Not on file  . Sexually Active: Not on file   Other Topics Concern  . Not on file   Social History Narrative   DipsHeavy alcohol   Review of Systems  Constitutional: Negative for fatigue and unexpected weight change.       Has cut back on dipping Wears seat belt  HENT: Negative for hearing loss, congestion, rhinorrhea, dental problem and tinnitus.        Doesn't go to dentist Does  wear hearing protection  Eyes: Negative for visual disturbance.       No diplopia or unilateral vision loss Needs reading glasses  Respiratory: Positive for cough. Negative for chest tightness and shortness of breath.        Tickle cough in throat---sore throat Pollen and maybe a cold Has restarted the loratadine  Cardiovascular: Negative for chest pain, palpitations and leg swelling.       Hasn't needed the furosemide  Gastrointestinal: Positive for anal bleeding. Negative for nausea, vomiting, abdominal pain, constipation and blood in stool.       Occ heartburn Occ red blood from hemorrhoid  Genitourinary: Positive for difficulty urinating. Negative for urgency.       Occ dribbling No sex--no problem (mostly)  Musculoskeletal: Positive for arthralgias. Negative for back pain and joint swelling.       Early finger knots Some variable pain  Skin: Positive for rash. Negative for color change.       Still has groin rash--uses OTC cortisone and it helps  Neurological: Positive for weakness and numbness. Negative for dizziness, syncope, light-headedness and headaches.       Numbness in forearm bilaterally---variable Pain by ulnar notches  Some weakness associated with the numbness  Hematological: Negative for adenopathy. Bruises/bleeds easily.  Psychiatric/Behavioral: Positive for sleep disturbance. Negative for dysphoric mood. The patient is not nervous/anxious.        Sleep is still variable---has avoided the zolpidem Is sleeping 8 hours now Occ sleep pressure in car--but better overall  Still has work and Sport and exercise psychologist Family stressors (son with DWI,etc)       Objective:   Physical Exam  Constitutional: He is oriented to person, place, and time. He appears well-developed and well-nourished. No distress.  HENT:  Head: Normocephalic and atraumatic.  Right Ear: External ear normal.  Left Ear: External ear normal.  Mouth/Throat: Oropharynx is clear and moist. No oropharyngeal  exudate.  Eyes: Conjunctivae and EOM are normal. Pupils are equal, round, and reactive to light.  Neck: Normal range of motion. Neck supple. No thyromegaly present.  Cardiovascular: Normal rate, regular rhythm, normal heart sounds and intact distal pulses.  Exam reveals no gallop.   No murmur heard. Pulmonary/Chest: Effort normal and breath sounds normal. No respiratory distress. He has no wheezes. He has no rales.  Abdominal: Soft. There is no tenderness.  Musculoskeletal: He exhibits no edema and no tenderness.  Lymphadenopathy:    He has no cervical adenopathy.  Neurological: He is alert and oriented to person, place, and time.  Skin: No rash noted. No erythema.  Psychiatric: He has a normal mood and affect. His behavior is normal.          Assessment & Plan:

## 2011-11-16 NOTE — Assessment & Plan Note (Signed)
Still not in shape Discussed stopping dipping Has cut back on beer Discussed fitness

## 2011-11-16 NOTE — Assessment & Plan Note (Signed)
Discussed better eating and exercise Handout given

## 2011-11-16 NOTE — Assessment & Plan Note (Signed)
BP Readings from Last 3 Encounters:  11/16/11 138/88  04/26/11 130/80  09/22/10 122/78   Reasonable control Check labs No changes

## 2011-11-17 ENCOUNTER — Encounter: Payer: Self-pay | Admitting: *Deleted

## 2011-11-25 ENCOUNTER — Other Ambulatory Visit: Payer: Self-pay | Admitting: Internal Medicine

## 2011-11-25 DIAGNOSIS — R7989 Other specified abnormal findings of blood chemistry: Secondary | ICD-10-CM

## 2011-12-01 ENCOUNTER — Other Ambulatory Visit (INDEPENDENT_AMBULATORY_CARE_PROVIDER_SITE_OTHER): Payer: Self-pay

## 2011-12-01 DIAGNOSIS — R7989 Other specified abnormal findings of blood chemistry: Secondary | ICD-10-CM

## 2011-12-01 LAB — CBC WITH DIFFERENTIAL/PLATELET
Basophils Relative: 0.9 % (ref 0.0–3.0)
Eosinophils Absolute: 0.2 10*3/uL (ref 0.0–0.7)
Eosinophils Relative: 3.9 % (ref 0.0–5.0)
Hemoglobin: 14.1 g/dL (ref 13.0–17.0)
Lymphocytes Relative: 25.8 % (ref 12.0–46.0)
MCHC: 34 g/dL (ref 30.0–36.0)
Monocytes Relative: 8.9 % (ref 3.0–12.0)
Neutro Abs: 2.9 10*3/uL (ref 1.4–7.7)
Neutrophils Relative %: 60.5 % (ref 43.0–77.0)
RBC: 4.42 Mil/uL (ref 4.22–5.81)
WBC: 4.9 10*3/uL (ref 4.5–10.5)

## 2011-12-15 ENCOUNTER — Other Ambulatory Visit: Payer: Self-pay | Admitting: Internal Medicine

## 2011-12-15 DIAGNOSIS — D696 Thrombocytopenia, unspecified: Secondary | ICD-10-CM

## 2011-12-15 HISTORY — DX: Thrombocytopenia, unspecified: D69.6

## 2011-12-19 ENCOUNTER — Telehealth: Payer: Self-pay | Admitting: Oncology

## 2011-12-19 NOTE — Telephone Encounter (Signed)
called pt lmovmto rtn call to scheduled appt with our office

## 2011-12-20 ENCOUNTER — Telehealth: Payer: Self-pay | Admitting: Oncology

## 2011-12-20 NOTE — Telephone Encounter (Signed)
called pt and scheduled appt for 07/03. faxed over letter to Dr. Alphonsus Sias with appt d/t

## 2011-12-21 ENCOUNTER — Telehealth: Payer: Self-pay | Admitting: Oncology

## 2011-12-21 NOTE — Telephone Encounter (Signed)
Referred by Dr. Tillman Abide Dx- Thrombocytopenia

## 2011-12-26 ENCOUNTER — Other Ambulatory Visit: Payer: Self-pay | Admitting: Oncology

## 2011-12-26 DIAGNOSIS — D696 Thrombocytopenia, unspecified: Secondary | ICD-10-CM

## 2011-12-28 ENCOUNTER — Other Ambulatory Visit (HOSPITAL_BASED_OUTPATIENT_CLINIC_OR_DEPARTMENT_OTHER): Payer: Self-pay | Admitting: Lab

## 2011-12-28 ENCOUNTER — Telehealth: Payer: Self-pay | Admitting: Oncology

## 2011-12-28 ENCOUNTER — Ambulatory Visit: Payer: Self-pay

## 2011-12-28 ENCOUNTER — Ambulatory Visit (HOSPITAL_BASED_OUTPATIENT_CLINIC_OR_DEPARTMENT_OTHER): Payer: Self-pay | Admitting: Oncology

## 2011-12-28 VITALS — BP 136/90 | HR 75 | Temp 97.8°F | Ht 67.0 in | Wt 246.9 lb

## 2011-12-28 DIAGNOSIS — D696 Thrombocytopenia, unspecified: Secondary | ICD-10-CM

## 2011-12-28 LAB — COMPREHENSIVE METABOLIC PANEL
ALT: 35 U/L (ref 0–53)
AST: 42 U/L — ABNORMAL HIGH (ref 0–37)
Albumin: 3.7 g/dL (ref 3.5–5.2)
Alkaline Phosphatase: 85 U/L (ref 39–117)
BUN: 8 mg/dL (ref 6–23)
Calcium: 9 mg/dL (ref 8.4–10.5)
Chloride: 104 mEq/L (ref 96–112)
Creatinine, Ser: 0.91 mg/dL (ref 0.50–1.35)
Potassium: 4.3 mEq/L (ref 3.5–5.3)

## 2011-12-28 NOTE — Progress Notes (Signed)
Note dictated

## 2011-12-28 NOTE — Telephone Encounter (Signed)
Gave pt appt for July 2013 U/S and MD visit , npo after midnight

## 2011-12-28 NOTE — Progress Notes (Signed)
CC:   Colton Schwalbe, MD  REASON FOR CONSULTATION:  Thrombocytopenia.  HISTORY OF PRESENT ILLNESS:  Colton Porter is a pleasant 46 year old gentleman currently of [place], [place].  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  This is a gentleman with significant past medical history of obesity, hyperlipidemia and hypertension.  He was noted to have had worsening thrombocytopenia recently.  His most recent CBC done at his primary care physician in June 2013 showed his platelet counts was 63,000.  His hemoglobins was 14, white cell count 4.9.  Previously his platelet counts were 66,000 four weeks ago.  A year ago they were 104. Four years ago they were 130,000.  He really had no abnormalities in his white cells or red cells noted before.  He had normal smear.  In the past he has been and continues to be a heavy alcohol user and he had been recommended multiple times to stop or reduce his drinking.  He has had an abdominal ultrasound in the past which showed slight splenomegaly and some mild fatty liver changes.  Clinically he reported some occasional epistaxis in the last week, but did not report any easy bruisability.  He did not report any hemoptysis.  He did not report any hematemesis.  He did not report any hematochezia.  He did not report any melena.  He continues to perform most activities of daily living without any hindrance or decline.  He had not had any changes in his activity level.  He had not had any energy decline.  He has continued to work full time.  REVIEW OF SYSTEMS:  He does not report any headaches, blurry vision, double vision.  He did not report any motor or sensory neuropathy.  He does not report any alteration in mental status.  He did not report any psychiatric issues, depression.  He did not report any fever, chills, sweats.  He did not report any cough, hemoptysis, hematemesis.  He  did not report any nausea or vomiting.  He did not report any abdominal pain.  No early satiety.  No change in his bowel habits.  No musculoskeletal complaints.  No arthralgias, myalgias.  No bleeding or clotting tendencies.  The rest of the review of systems is unremarkable.  PAST MEDICAL HISTORY:  Significant for hypertension, hyperlipidemia, obesity, GERD and neuropathy.  MEDICATION:  He is on Lopressor and Cardura.  ALLERGIES:  He is allergic to hydrochlorothiazide with triamterene and lisinopril.  SOCIAL HISTORY:  He is married.  He has 2 children.  He uses smokeless tobacco.  Drinks 12 beers a day.  FAMILY HISTORY:  His mother died of COPD complications.  She had lung cancer.  His father also had lung cancer, but had surgery alive at this point.  He has had coronary disease.  He has 3 siblings, none of whom have any bleeding disorders.  PHYSICAL EXAMINATION:  Alert, awake gentleman appeared in no active distress today.  Blood pressure 136/90, pulse is 70, respirations 20, weight 246 pounds.  Head is normocephalic, atraumatic.  Pupils equal, round, reactive to light.  Oral mucosa moist and pink.  Neck:  Supple without adenopathy.  Heart:  Regular rate and rhythm.  S1, S2.  Lungs: Clear to auscultation without rhonchi, wheeze or dullness to percussion. Abdomen:  Soft, nontender.  No hepatosplenomegaly.  Extremities:  No clubbing, cyanosis, or edema.  Neurologic:  Intact motor, sensory and deep tendon reflexes.  Skin:  No rashes or lesions.  LABORATORY DATA:  Hemoglobin 14, white cell count 4.2, platelet count 65,000.  This is essentially unchanged from 12/01/2011.  Differential is completely normal.  Peripheral smear was personally reviewed today and showed slightly enlarged platelets, but no platelet clumping that I could appreciate.  I could not see any red cell abnormalities.  I did not see any schistocytosis or red cell fragmentation.  I did not see any nucleated red  cells or spherocytes.  The white cells appeared normal. There is no evidence of any dysplasia.  I did not see any evidence of any immature white cells.  ASSESSMENT AND PLAN:  This is a pleasant 46 year old gentleman with the following issues. 1. Thrombocytopenia.  The differential diagnosis discussed today with     Colton Porter which would include medication related     thrombocytopenia versus a drug related such as alcohol.  I think     the most likely etiology in him would be alcohol related.  He has     had some liver changes and liver function abnormalities already.     He has been heavy drinker for quite a while now.  He did have an     ultrasound as mentioned in 2010 which showed mild a splenomegaly     and fatty infiltration of the liver indicating already alcohol     damage.  Other etiologies include autoimmune disorders,     myeloproliferative disorder that can cause splenomegaly which     should cause thrombocytosis rather thrombocytopenia.  Primary     splenic lymphoma would be extremely unlikely at this point given     his age.  Again I doubt there is a myelodysplastic syndrome,     leukemia or lymphoma at this point.  However for completeness, I     will repeat his comprehensive metabolic panel as well as an     abdominal ultrasound and will ask him to come back and visit after     these are completed.  I will give my final recommendation at that     time.  For the time being, it is safe to say that cutting down on     his alcohol as much as possible would be beneficial, not only for     his platelets but overall health, so will start with that     recommendation and proceed from there.  He does not need any     platelet transfusion.  He does not need any steroid trial at this     time. 2. History of alcohol consumption and alcohol abuse.  Again I     reiterated for him the importance to cut down as much as possible     as he might inflict more liver damage as well as more  hematological     problems down the line.    ______________________________ Benjiman Core, M.D. FNS/MEDQ  D:  12/28/2011  T:  12/28/2011  Job:  [id number]

## 2011-12-30 ENCOUNTER — Encounter: Payer: Self-pay | Admitting: Oncology

## 2011-12-30 NOTE — Progress Notes (Signed)
New patient , patient has no insurance, gave patient EPP application patient will not qualify for medicaid.

## 2012-01-03 ENCOUNTER — Ambulatory Visit (HOSPITAL_COMMUNITY)
Admission: RE | Admit: 2012-01-03 | Discharge: 2012-01-03 | Disposition: A | Discharge status: Home / Self Care | Payer: Self-pay | Source: Ambulatory Visit | Attending: Oncology | Admitting: Oncology

## 2012-01-03 DIAGNOSIS — K7689 Other specified diseases of liver: Secondary | ICD-10-CM | POA: Insufficient documentation

## 2012-01-03 DIAGNOSIS — R161 Splenomegaly, not elsewhere classified: Secondary | ICD-10-CM | POA: Insufficient documentation

## 2012-01-03 DIAGNOSIS — D696 Thrombocytopenia, unspecified: Secondary | ICD-10-CM | POA: Insufficient documentation

## 2012-01-13 ENCOUNTER — Ambulatory Visit: Payer: Self-pay | Admitting: Oncology

## 2012-01-19 ENCOUNTER — Telehealth: Payer: Self-pay | Admitting: Oncology

## 2012-01-19 NOTE — Telephone Encounter (Signed)
Returned pt's call re r/s 7/19 appt. lmonvm for pt re new appt for 8/2 @ 12 pm.

## 2012-01-27 ENCOUNTER — Ambulatory Visit (HOSPITAL_BASED_OUTPATIENT_CLINIC_OR_DEPARTMENT_OTHER): Payer: Self-pay | Admitting: Oncology

## 2012-01-27 ENCOUNTER — Telehealth: Payer: Self-pay | Admitting: Oncology

## 2012-01-27 VITALS — BP 135/84 | HR 85 | Temp 97.9°F | Resp 20 | Ht 67.0 in | Wt 244.1 lb

## 2012-01-27 DIAGNOSIS — D696 Thrombocytopenia, unspecified: Secondary | ICD-10-CM

## 2012-01-27 DIAGNOSIS — F1020 Alcohol dependence, uncomplicated: Secondary | ICD-10-CM

## 2012-01-27 DIAGNOSIS — K769 Liver disease, unspecified: Secondary | ICD-10-CM

## 2012-01-27 NOTE — Telephone Encounter (Signed)
Gave pt appt calendar appt for February 2014 lab and Md

## 2012-01-27 NOTE — Progress Notes (Signed)
Hematology and Oncology Follow Up Visit  HERSEL MCMEEN [id number] 07/14/1965 46 y.o. 01/27/2012 12:14 PM    Principle Diagnosis: 46 year old with thrombocytopenia. The etiology related to liver disease and alcohol consumption. Less likely related to a hematological disorder. Diagnosed in 12/2011.    Interim History:  Mr. Highfill presents today for a follow up visit. He was seen back on 7/3 for the evaluation of thrombocytopenia with platelet count of  65 K. Clinically he  did not report any hemoptysis. He did not report any hematemesis. He did not report any hematochezia. He did not report any  melena. He continues to perform most activities of daily living without any hindrance or decline. He had not had any changes in his activity  evel. He had not had any energy decline. He has continued to work full time. He still drinks close to 6 beers a day.   Medications: I have reviewed the patient's current medications. Current outpatient prescriptions:doxazosin (CARDURA) 4 MG tablet, Take 1 tablet (4 mg total) by mouth at bedtime., Disp: 90 tablet, Rfl: 3;  metoprolol tartrate (LOPRESSOR) 25 MG tablet, TAKE ONE TABLET BY MOUTH TWICE DAILY, Disp: 60 tablet, Rfl: 11  Allergies:  Allergies  Allergen Reactions  . Hydrochlorothiazide W-Triamterene     REACTION: dizzy, nausea  . Lisinopril     REACTION: cough, decreased libido    Past Medical History, Surgical history, Social history, and Family History were reviewed and updated.  Review of Systems: Constitutional:  Negative for fever, chills, night sweats, anorexia, weight loss, pain. Cardiovascular: no chest pain or dyspnea on exertion Respiratory: negative Neurological: negative Dermatological: negative ENT: negative Skin: Negative. Gastrointestinal: negative Genito-Urinary: negative Hematological and Lymphatic: negative Breast: negative Musculoskeletal: negative Remaining ROS negative. Physical Exam: Blood pressure 135/84, pulse  85, temperature 97.9 F (36.6 C), temperature source Oral, resp. rate 20, height 5\' 7"  (1.702 m), weight 244 lb 1.6 oz (110.723 kg). ECOG: 0 General appearance: alert Head: Normocephalic, without obvious abnormality, atraumatic Neck: no adenopathy, no carotid bruit, no JVD, supple, symmetrical, trachea midline and thyroid not enlarged, symmetric, no tenderness/mass/nodules Lymph nodes: Cervical, supraclavicular, and axillary nodes normal. Heart:regular rate and rhythm, S1, S2 normal, no murmur, click, rub or gallop Lung:chest clear, no wheezing, rales, normal symmetric air entry Abdomin: soft, non-tender, without masses or organomegaly EXT:no erythema, induration, or nodules   Lab Results: Lab Results  Component Value Date   WBC 4.2 12/28/2011   HGB 14.4 12/28/2011   HCT 41.9 12/28/2011   MCV 92.5 12/28/2011   PLT 65* 12/28/2011     Chemistry      Component Value Date/Time   NA 139 12/28/2011 1036   K 4.3 12/28/2011 1036   CL 104 12/28/2011 1036   CO2 23 12/28/2011 1036   BUN 8 12/28/2011 1036   CREATININE 0.91 12/28/2011 1036      Component Value Date/Time   CALCIUM 9.0 12/28/2011 1036   ALKPHOS 85 12/28/2011 1036   AST 42* 12/28/2011 1036   ALT 35 12/28/2011 1036   BILITOT 0.5 12/28/2011 1036       Radiological Studies: Clinical Data: Thrombocytopenia. Evaluate liver and splenic size.  COMPLETE ABDOMINAL ULTRASOUND  Comparison: 05/29/2009  Findings:  Gallbladder: Normal, without wall thickening, stone, or  pericholecystic fluid. Sonographic Murphy's sign was not elicited.  Common bile duct: Normal, 5 mm.  Liver: Mild hepatomegaly, 18.4 cm cranial caudal. Increased  echogenicity. Area of relative hypoechogenicity adjacent the  gallbladder measures 2.0 cm and is favored to  represent sparing.  IVC: Negative  Pancreas: Poorly visualized due to overlying bowel gas.  Spleen: Progressive splenomegaly. The spleen measures 14.3 x 5.8  x 16.9 cm; 740 ml splenic volume.  Right Kidney: 12.9 cm. No  hydronephrosis.  Left Kidney: 13.5 cm. No hydronephrosis.  Abdominal aorta: Nonaneurysmal without ascites.  IMPRESSION:  1. Progressive moderate splenomegaly.  2. Similar mild hepatomegaly.  3. Hepatic steatosis with sparing adjacent the gallbladder.    Impression and Plan: This is a pleasant 46 year old gentleman with the  following issues.  1. Thrombocytopenia. The differential diagnosis includealcohol related. He has  had some liver changes and liver function abnormalities already. His ultrasound as mentioned in 2010 which showed mild a splenomegaly, repeat U/S discussed today and continued to show hepatomegaly as well.  Other etiologies include autoimmune disorders, myeloproliferative primary splenic lymphoma would be extremely unlikely at this point given his age. I doubt there is a myelodysplastic syndrome, leukemia or lymphoma at this point. For now, we will continue observation. Follow up in 6 months.   2. History of alcohol consumption and alcohol abuse. I reiterated for him the importance to cut down as much as possible  as he might inflict more liver damage as well as more hematological problems down the line.    Eli Hose, MD 8/2/201312:14 PM

## 2012-03-30 ENCOUNTER — Telehealth: Payer: Self-pay | Admitting: Internal Medicine

## 2012-03-30 NOTE — Telephone Encounter (Signed)
Caller: [name]/Patient; Patient Name: Colton Porter; PCP: Tillman Abide [facility]); Best Callback Phone Number: [phone].  Pt. had BP taken at pharmacy:  162/100.  Pt.then took a second dose of metoprolol, as advised by his pharmacist,  and took BP 1 hour after; and it came down to 130/80.  Pt. had dizziness during the high BP reading, and numbness on the R side of pt.s face.  Within 1 1/2 hours, the numbness  And dizziness subsided.  Pt. calling to see what he should do if this happens again.  All symptoms have subsided since BP is 130/80.  Triaged per Hypertension, Diagnosed, and emergent sx. r/o per guidelines.  Disposition Home care for: Single elevated blood pressure reading and has questions.   Disposition upgraded to Nursing Judement:  See Provider within 1 week.  Appoinment scheduled for Wed. 04/04/12 at 08:00 with Dr. Alphonsus Sias.  Pt. given home care instructions.  When assessing risk factors for HTN, pt. noted to be drinking 6 drinks daily.  Pt. is also obese.  He expressed no desire in behavioral changes at this time.  CAN/db.

## 2012-03-31 NOTE — Telephone Encounter (Signed)
Please call him If he has recurrent symptoms, we will increase his regular dose of the metoprolol  Remind him that BP will go up after drinking alcohol He should try to limit to no more than 3-4 drinks per day

## 2012-04-02 NOTE — Telephone Encounter (Signed)
Left message on VM, pt has appt scheduled for Wednesday 04/04/12

## 2012-04-04 ENCOUNTER — Ambulatory Visit: Payer: Self-pay | Admitting: Internal Medicine

## 2012-05-18 ENCOUNTER — Ambulatory Visit: Payer: Self-pay | Admitting: Internal Medicine

## 2012-05-18 ENCOUNTER — Other Ambulatory Visit: Payer: Self-pay | Admitting: *Deleted

## 2012-05-18 DIAGNOSIS — Z0289 Encounter for other administrative examinations: Secondary | ICD-10-CM

## 2012-05-18 MED ORDER — METOPROLOL TARTRATE 25 MG PO TABS: ORAL_TABLET | ORAL | Status: DC

## 2012-07-11 ENCOUNTER — Ambulatory Visit (INDEPENDENT_AMBULATORY_CARE_PROVIDER_SITE_OTHER): Payer: Self-pay | Admitting: Internal Medicine

## 2012-07-11 ENCOUNTER — Encounter: Payer: Self-pay | Admitting: Internal Medicine

## 2012-07-11 VITALS — BP 128/80 | HR 77 | Temp 98.6°F | Wt 256.0 lb

## 2012-07-11 DIAGNOSIS — J019 Acute sinusitis, unspecified: Secondary | ICD-10-CM

## 2012-07-11 DIAGNOSIS — G4733 Obstructive sleep apnea (adult) (pediatric): Secondary | ICD-10-CM

## 2012-07-11 DIAGNOSIS — F1021 Alcohol dependence, in remission: Secondary | ICD-10-CM | POA: Insufficient documentation

## 2012-07-11 DIAGNOSIS — F1020 Alcohol dependence, uncomplicated: Secondary | ICD-10-CM

## 2012-07-11 HISTORY — DX: Alcohol dependence, uncomplicated: F10.20

## 2012-07-11 HISTORY — DX: Obstructive sleep apnea (adult) (pediatric): G47.33

## 2012-07-11 MED ORDER — FLUTICASONE PROPIONATE 50 MCG/ACT NA SUSP: 2.0000 | Freq: Every day | NASAL | Status: DC

## 2012-07-11 MED ORDER — METOPROLOL TARTRATE 25 MG PO TABS: ORAL_TABLET | ORAL | Status: DC

## 2012-07-11 MED ORDER — AMOXICILLIN 500 MG PO TABS: 1000.0000 mg | ORAL_TABLET | Freq: Two times a day (BID) | ORAL | Status: DC

## 2012-07-11 MED ORDER — DOXAZOSIN MESYLATE 4 MG PO TABS: 4.0000 mg | ORAL_TABLET | Freq: Every day | ORAL | Status: DC

## 2012-07-11 NOTE — Progress Notes (Signed)
Subjective:    Patient ID: Colton Porter, male    DOB: Nov 14, 1965, 47 y.o.   MRN: [id number]  HPI Here with sister  Has had problems with his right ear for a couple of months Frontal pressure Some nasal congestion--not bad now Toothache 2 weeks ago--increased pressure then. Took leftover vicodin and this helped Some degree of allergies Hasn't used OTC meds for fear of his BP response  Has been concerned about his BP Came here a couple of months ago and was told he needed appt---got him very upset Got checked at pharmacy (see phone note)  No fever Son with flu now No obvious PND Not much cough  Sister concerned about his liver Due to his drinking Wonders about getting liver tests rechecked  Ongoing depression Still grieving mom's death from 6 years ago Self medicates with alcohol  Ongoing painful edema in legs Some DOE Has sleep apnea----wife notices he stops breathing  Current Outpatient Prescriptions on File Prior to Visit  Medication Sig Dispense Refill  . doxazosin (CARDURA) 4 MG tablet Take 1 tablet (4 mg total) by mouth at bedtime.  90 tablet  3  . metoprolol tartrate (LOPRESSOR) 25 MG tablet TAKE ONE TABLET BY MOUTH TWICE DAILY  180 tablet  3    Allergies  Allergen Reactions  . Hydrochlorothiazide W-Triamterene     REACTION: dizzy, nausea  . Lisinopril     REACTION: cough, decreased libido    Past Medical History  Diagnosis Date  . Allergy   . GERD (gastroesophageal reflux disease)   . Hyperlipidemia   . Hypertension   . Neuropathy     No past surgical history on file.  Family History  Problem Relation Age of Onset  . Stroke Mother   . Hypertension Father   . Heart disease Father   . Cancer Maternal Aunt     Lung    History   Social History  . Marital Status: Married    Spouse Name: N/A    Number of Children: 2  . Years of Education: N/A   Occupational History  . [employer]    Social History Main Topics  . Smoking  status: Never Smoker   . Smokeless tobacco: Current User    Types: Chew     Comment: has been trying to cut back  . Alcohol Use: Yes  . Drug Use: Not on file  . Sexually Active: Not on file   Other Topics Concern  . Not on file   Social History Narrative   DipsHeavy alcohol   Review of Systems Occ blurry vision---feels it is pressure in frontal area. Does okay with reading glasses Strong FH of aortic aneurysm and heart problems    Objective:   Physical Exam  Constitutional: He appears well-developed. No distress.  HENT:  Mouth/Throat: Oropharynx is clear and moist. No oropharyngeal exudate.       TMs normal Moderate nasal inflammation esp on right. Some thick mucus there  Neck: Normal range of motion. Neck supple. No thyromegaly present.  Cardiovascular: Normal rate, regular rhythm, normal heart sounds and intact distal pulses.  Exam reveals no gallop.   No murmur heard. Pulmonary/Chest: Effort normal and breath sounds normal. No respiratory distress. He has no wheezes. He has no rales.  Abdominal: Soft. There is no tenderness.  Musculoskeletal:       2-3+ tense edema  Lymphadenopathy:    He has no cervical adenopathy.  Psychiatric: He has a normal mood and affect. His  behavior is normal.          Assessment & Plan:

## 2012-07-11 NOTE — Assessment & Plan Note (Signed)
Fairly classic history No money now for testing---will apply for reduced payment program.  Will refer for sleep eval when he is ready

## 2012-07-11 NOTE — Assessment & Plan Note (Signed)
He doesn't accept this as a big concern Recommended AA---he is not interested

## 2012-07-11 NOTE — Assessment & Plan Note (Signed)
Not clear if this is infection but has purulent material in right nose Will give amoxil in case Fluticasone Try loratadine

## 2012-07-11 NOTE — Patient Instructions (Signed)
You can also try loratadine (claritin)10mg  --- 1-2 daily

## 2012-07-31 ENCOUNTER — Ambulatory Visit: Payer: Self-pay | Admitting: Oncology

## 2012-08-02 ENCOUNTER — Other Ambulatory Visit (HOSPITAL_BASED_OUTPATIENT_CLINIC_OR_DEPARTMENT_OTHER): Payer: Self-pay | Admitting: Lab

## 2012-08-02 ENCOUNTER — Telehealth: Payer: Self-pay | Admitting: Oncology

## 2012-08-02 ENCOUNTER — Ambulatory Visit (HOSPITAL_BASED_OUTPATIENT_CLINIC_OR_DEPARTMENT_OTHER): Payer: Self-pay | Admitting: Oncology

## 2012-08-02 VITALS — BP 125/76 | HR 77 | Temp 97.2°F | Resp 22 | Ht 67.0 in | Wt 260.4 lb

## 2012-08-02 DIAGNOSIS — D696 Thrombocytopenia, unspecified: Secondary | ICD-10-CM

## 2012-08-02 DIAGNOSIS — K7689 Other specified diseases of liver: Secondary | ICD-10-CM

## 2012-08-02 DIAGNOSIS — F1010 Alcohol abuse, uncomplicated: Secondary | ICD-10-CM

## 2012-08-02 DIAGNOSIS — R160 Hepatomegaly, not elsewhere classified: Secondary | ICD-10-CM

## 2012-08-02 LAB — COMPREHENSIVE METABOLIC PANEL (CC13)
AST: 40 U/L — ABNORMAL HIGH (ref 5–34)
Albumin: 2.9 g/dL — ABNORMAL LOW (ref 3.5–5.0)
BUN: 6.3 mg/dL — ABNORMAL LOW (ref 7.0–26.0)
CO2: 24 mEq/L (ref 22–29)
Calcium: 8.7 mg/dL (ref 8.4–10.4)
Chloride: 105 mEq/L (ref 98–107)
Creatinine: 0.9 mg/dL (ref 0.7–1.3)
Glucose: 100 mg/dl — ABNORMAL HIGH (ref 70–99)
Potassium: 4.2 mEq/L (ref 3.5–5.1)

## 2012-08-02 LAB — CBC WITH DIFFERENTIAL/PLATELET
Basophils Absolute: 0 10*3/uL (ref 0.0–0.1)
Eosinophils Absolute: 0.1 10*3/uL (ref 0.0–0.5)
HCT: 40.3 % (ref 38.4–49.9)
HGB: 14.1 g/dL (ref 13.0–17.1)
MCH: 32.3 pg (ref 27.2–33.4)
MONO#: 0.3 10*3/uL (ref 0.1–0.9)
NEUT#: 1.9 10*3/uL (ref 1.5–6.5)
NEUT%: 56.5 % (ref 39.0–75.0)
RDW: 13.2 % (ref 11.0–14.6)
WBC: 3.3 10*3/uL — ABNORMAL LOW (ref 4.0–10.3)
lymph#: 1 10*3/uL (ref 0.9–3.3)

## 2012-08-02 NOTE — Progress Notes (Signed)
Hematology and Oncology Follow Up Visit  Colton Porter [id number] 1966-05-05 46 y.o. 08/02/2012 9:20 AM    Principle Diagnosis: 48 year old with thrombocytopenia. The etiology related to liver disease and alcohol consumption. Less likely related to a hematological disorder. Diagnosed in 12/2011.    Interim History:  Colton Porter presents today for a follow up visit. He was seen back on 12/2011 for the evaluation of thrombocytopenia with platelet count of 65 K. Clinically he  did not report any hemoptysis. He did not report any hematemesis. He did not report any hematochezia. He did not report any melena. He continues to perform most activities of daily living without any hindrance or decline. He had not had any changes in his activity evel. He had not had any energy decline. He has continued to work full time. He still drinks close to 6 beers a day. He reported some excessive bleeding after being cut at time.   Medications: I have reviewed the patient's current medications. Current outpatient prescriptions:amoxicillin (AMOXIL) 500 MG tablet, Take 2 tablets (1,000 mg total) by mouth 2 (two) times daily., Disp: 40 tablet, Rfl: 0;  doxazosin (CARDURA) 4 MG tablet, Take 1 tablet (4 mg total) by mouth at bedtime., Disp: 90 tablet, Rfl: 3;  fluticasone (FLONASE) 50 MCG/ACT nasal spray, Place 2 sprays into the nose daily. In each nostril, Disp: 16 g, Rfl: 12 metoprolol tartrate (LOPRESSOR) 25 MG tablet, TAKE ONE TABLET BY MOUTH TWICE DAILY, Disp: 180 tablet, Rfl: 3  Allergies:  Allergies  Allergen Reactions  . Hydrochlorothiazide W-Triamterene     REACTION: dizzy, nausea  . Lisinopril     REACTION: cough, decreased libido    Past Medical History, Surgical history, Social history, and Family History were reviewed and updated.  Review of Systems: Constitutional:  Negative for fever, chills, night sweats, anorexia, weight loss, pain. Cardiovascular: no chest pain or dyspnea on exertion Respiratory:  negative Neurological: negative Dermatological: negative ENT: negative Skin: Negative. Gastrointestinal: negative Genito-Urinary: negative Hematological and Lymphatic: negative Breast: negative Musculoskeletal: negative Remaining ROS negative. Physical Exam: Blood pressure 125/76, pulse 77, temperature 97.2 F (36.2 C), temperature source Oral, resp. rate 22, height 5\' 7"  (1.702 m), weight 260 lb 6.4 oz (118.117 kg). ECOG: 0 General appearance: alert Head: Normocephalic, without obvious abnormality, atraumatic Neck: no adenopathy, no carotid bruit, no JVD, supple, symmetrical, trachea midline and thyroid not enlarged, symmetric, no tenderness/mass/nodules Lymph nodes: Cervical, supraclavicular, and axillary nodes normal. Heart:regular rate and rhythm, S1, S2 normal, no murmur, click, rub or gallop Lung:chest clear, no wheezing, rales, normal symmetric air entry Abdomin: soft, non-tender, without masses or organomegaly EXT:no erythema, induration, or nodules   Lab Results: Lab Results  Component Value Date   WBC 3.3* 08/02/2012   HGB 14.1 08/02/2012   HCT 40.3 08/02/2012   MCV 92.3 08/02/2012   PLT 58* 08/02/2012     Chemistry      Component Value Date/Time   NA 139 12/28/2011 1036   K 4.3 12/28/2011 1036   CL 104 12/28/2011 1036   CO2 23 12/28/2011 1036   BUN 8 12/28/2011 1036   CREATININE 0.91 12/28/2011 1036      Component Value Date/Time   CALCIUM 9.0 12/28/2011 1036   ALKPHOS 85 12/28/2011 1036   AST 42* 12/28/2011 1036   ALT 35 12/28/2011 1036   BILITOT 0.5 12/28/2011 1036     U/S on 01/03/2012  IMPRESSION:  1. Progressive moderate splenomegaly.  2. Similar mild hepatomegaly.  3. Hepatic steatosis with  sparing adjacent the gallbladder.    Impression and Plan: This is a pleasant 47 year old gentleman with the  following issues.  1. Thrombocytopenia. The differential diagnosis includealcohol related. He has had some liver changes and liver function abnormalities already. His  ultrasound as mentioned in 2010 which showed mild a splenomegaly, repeat U/S  continued to show hepatomegaly as well.  Other etiologies include autoimmune disorders, myeloproliferative primary splenic lymphoma would be extremely unlikely at this point given his age.I doubt there is a myelodysplastic syndrome, leukemia or lymphoma at this point. For now, we will continue observation. Follow up in 6 months.   2. History of alcohol consumption and alcohol abuse. I reiterated for him the importance to cut down as much as possible  as he might inflict more liver damage as well as more hematological problems down the line.    [facility], MD 2/6/20149:20 AM

## 2012-08-02 NOTE — Telephone Encounter (Signed)
Gave pt appt for lab and MD August 2014 °

## 2012-08-11 ENCOUNTER — Other Ambulatory Visit: Payer: Self-pay

## 2012-10-09 ENCOUNTER — Ambulatory Visit: Payer: Self-pay | Admitting: Internal Medicine

## 2012-10-09 DIAGNOSIS — Z0289 Encounter for other administrative examinations: Secondary | ICD-10-CM

## 2012-11-23 ENCOUNTER — Other Ambulatory Visit: Payer: Self-pay | Admitting: Internal Medicine

## 2012-11-23 MED ORDER — DOXAZOSIN MESYLATE 4 MG PO TABS: 4.0000 mg | ORAL_TABLET | Freq: Every day | ORAL | Status: DC

## 2013-01-03 ENCOUNTER — Telehealth: Payer: Self-pay | Admitting: Oncology

## 2013-01-03 NOTE — Telephone Encounter (Signed)
, °

## 2013-01-30 ENCOUNTER — Other Ambulatory Visit: Payer: Self-pay | Admitting: Lab

## 2013-01-30 ENCOUNTER — Ambulatory Visit: Payer: Self-pay | Admitting: Oncology

## 2013-05-02 ENCOUNTER — Other Ambulatory Visit: Payer: Self-pay

## 2013-05-24 ENCOUNTER — Encounter: Payer: Self-pay | Admitting: Family Medicine

## 2013-05-24 ENCOUNTER — Ambulatory Visit: Payer: Self-pay | Admitting: Family Medicine

## 2013-05-24 ENCOUNTER — Ambulatory Visit (INDEPENDENT_AMBULATORY_CARE_PROVIDER_SITE_OTHER): Payer: BC Managed Care – PPO | Admitting: Family Medicine

## 2013-05-24 VITALS — BP 130/78 | HR 78 | Temp 98.4°F | Ht 67.0 in | Wt 266.2 lb

## 2013-05-24 DIAGNOSIS — I10 Essential (primary) hypertension: Secondary | ICD-10-CM

## 2013-05-24 DIAGNOSIS — F1020 Alcohol dependence, uncomplicated: Secondary | ICD-10-CM

## 2013-05-24 DIAGNOSIS — G4733 Obstructive sleep apnea (adult) (pediatric): Secondary | ICD-10-CM

## 2013-05-24 DIAGNOSIS — Z8249 Family history of ischemic heart disease and other diseases of the circulatory system: Secondary | ICD-10-CM

## 2013-05-24 DIAGNOSIS — R079 Chest pain, unspecified: Secondary | ICD-10-CM

## 2013-05-24 DIAGNOSIS — E669 Obesity, unspecified: Secondary | ICD-10-CM

## 2013-05-24 DIAGNOSIS — R609 Edema, unspecified: Secondary | ICD-10-CM

## 2013-05-24 DIAGNOSIS — R0602 Shortness of breath: Secondary | ICD-10-CM

## 2013-05-24 DIAGNOSIS — E785 Hyperlipidemia, unspecified: Secondary | ICD-10-CM

## 2013-05-24 LAB — HEPATIC FUNCTION PANEL
ALT: 26 U/L (ref 0–53)
AST: 42 U/L — ABNORMAL HIGH (ref 0–37)
Albumin: 3.4 g/dL — ABNORMAL LOW (ref 3.5–5.2)
Bilirubin, Direct: 0.3 mg/dL (ref 0.0–0.3)
Total Protein: 7.7 g/dL (ref 6.0–8.3)

## 2013-05-24 LAB — BASIC METABOLIC PANEL WITH GFR
BUN: 7 mg/dL (ref 6–23)
CO2: 28 meq/L (ref 19–32)
Calcium: 8.8 mg/dL (ref 8.4–10.5)
Chloride: 101 meq/L (ref 96–112)
Creat: 0.83 mg/dL (ref 0.50–1.35)
Glucose, Bld: 92 mg/dL (ref 70–99)
Potassium: 4 meq/L (ref 3.5–5.3)
Sodium: 136 meq/L (ref 135–145)

## 2013-05-24 NOTE — Patient Instructions (Signed)
REFERRAL: GO THE THE FRONT ROOM AT THE ENTRANCE OF OUR CLINIC, NEAR CHECK IN. ASK FOR LINDA SHE WILL HELP YOU SET UP YOUR REFERRAL. DATE: TIME:   SLEEP MEDICINE DOCTORS / SLEEP STUDY CARDIOLOGY REFERRAL GO TO THE LAB TODAY

## 2013-05-24 NOTE — Progress Notes (Signed)
Pre-visit discussion using our clinic review tool. No additional management support is needed unless otherwise documented below in the visit note.  

## 2013-05-24 NOTE — Progress Notes (Signed)
Date:  05/24/2013   Name:  Colton Porter   DOB:  24-Oct-1965   MRN:  [id number] Gender: male Age: 47 y.o.  Primary Physician:  Tillman Abide, MD   Chief Complaint: Leg Swelling   Subjective:   History of Present Illness:  Colton Porter is a 47 y.o. pleasant patient who presents with the following:  The patient comes in today for an acute office visit, but he is accompanied by his wife and his sister, and they are markedly concerned for some issues that have been ongoing now for months. They are concerned about him, and would like to have some of them evaluated as soon as possible.  The patient has a BMI of 41, and he has had multiple episodes witnessed by his wife of stopping breathing while he is laying down and in the middle of sleep. He has never had a sleep study before, but it appears as if this is been discussed with him before. The other night night - he stopped breathing  He does feel tired and poorly much of the time. He has multiple relatives who have sleep apnea.  He also has a parent who died from an abdominal aortic aneurysm, and he has multiple siblings and other family members who have aneurysms. To his knowledge he has never been checked for aneurysm, and it does not appear so upon my breakdown of the chart.  Edema: He has had some intermittent leg edema now for years. This does not bother him all that much, but his wife and sister are very worried about it. His wife has severe congestive heart failure, and has some questions whether or not he could potentially have heart failure. He does have multiple members of the family who have had cardiac disease, and he has a BMI of 41.  Has shortness of breath, chest pain. He also has complaints and there was very worried about him having chest pain shortness of breath intermittently now for 6 months. He is minimally able to walk and ambulate without having significant shortness of breath. He does also get intermittently  some chest pain as well. He has no active chest pain currently.  Larey Seat a couple of weeks ago.  His left shoulder is hurting somewhat  Cardiac risk factors: No tob No drugs. Never cocaine. Never amphetamines. 10-18 beers / day Father, aortic valve replacement Father, aortic aneurym Maternal grandparents, aneurysm Paternal GM, stroke, CAD Brother, 26 with MI  HTN BP Readings from Last 3 Encounters:  05/24/13 130/78  08/02/12 125/76  07/11/12 128/80  Compliant with meds  Lipids:    Component Value Date/Time   CHOL 206* 11/16/2011 0923   TRIG 242.0* 11/16/2011 0923   HDL 47.30 11/16/2011 0923   LDLDIRECT 115.5 11/16/2011 0923   VLDL 48.4* 11/16/2011 0923   CHOLHDL 4 11/16/2011 0923   non-fasting today  The patient is also a chronic alcoholic and has been drinking from to 12 beers a day up to case of beer a day for least the last several years. He has had some abnormalities in his LFTs previously. For my review the chart, it appears his AST has only been mildly elevated thus far. He started to drink excessively when his mother passed away.   Patient Active Problem List   Diagnosis Date Noted  . Acute sinusitis 07/11/2012  . Obstructive sleep apnea 07/11/2012  . Alcohol dependence 07/11/2012  . Thrombocytopenia 12/15/2011  . Obesity 11/16/2011  . Routine general medical examination at a  health care facility 09/22/2010  . NEUROPATHY 05/27/2009  . EDEMA 09/22/2008  . UNSPECIFIED SLEEP DISTURBANCE 08/29/2008  . HYPERLIPIDEMIA 02/15/2007  . HYPERTENSION 02/15/2007  . ALLERGIC RHINITIS 02/15/2007  . GERD 02/15/2007    Past Medical History  Diagnosis Date  . Allergy   . GERD (gastroesophageal reflux disease)   . Hyperlipidemia   . Hypertension   . Neuropathy     No past surgical history on file.  History   Social History  . Marital Status: Married    Spouse Name: N/A    Number of Children: 2  . Years of Education: N/A   Occupational History  . [employer]      no work lately   Social History Main Topics  . Smoking status: Never Smoker   . Smokeless tobacco: Current User    Types: Chew     Comment: has been trying to cut back  . Alcohol Use: Yes  . Drug Use: Not on file  . Sexual Activity: Not on file   Other Topics Concern  . Not on file   Social History Narrative   Dips   Heavy alcohol    Family History  Problem Relation Age of Onset  . Stroke Mother   . Hypertension Father   . Heart disease Father   . Cancer Maternal Aunt     Lung    Allergies  Allergen Reactions  . Hydrochlorothiazide W-Triamterene     REACTION: dizzy, nausea  . Lisinopril     REACTION: cough, decreased libido    Medication list has been reviewed and updated.  Review of Systems: Multiple problems are detailed above including chest pain, shortness of breath, edema, chronic alcoholism, gasping for breath during sleep, fatigue, dyspnea on exertion. No other neurological symptoms. The patient is not having active chest pain. Otherwise, the pertinent positives and negatives are listed above and in the HPI, otherwise a full review of systems has been reviewed and is negative unless noted positive.   Objective:   Physical Examination: BP 130/78  Pulse 78  Temp(Src) 98.4 F (36.9 C) (Oral)  Ht 5\' 7"  (1.702 m)  Wt 266 lb 4 oz (120.77 kg)  BMI 41.69 kg/m2  Ideal Body Weight: Weight in (lb) to have BMI = 25: 159.3   GEN: WDWN, NAD, Non-toxic, A & O x 3 HEENT: Atraumatic, Normocephalic. Neck supple. No masses, No LAD. Ears and Nose: No external deformity. CV: RRR, No M/G/R. No JVD. No thrill. No extra heart sounds. PULM: CTA B, no wheezes, crackles, rhonchi. No retractions. No resp. distress. No accessory muscle use. EXTR: No c/c/e NEURO Normal gait.  PSYCH: Normally interactive. Conversant. Not depressed or anxious appearing.  Calm demeanor.   Laboratory and Imaging Data: Results for orders placed in visit on 08/02/12  CBC WITH DIFFERENTIAL       Result Value Range   WBC 3.3 (*) 4.0 - 10.3 10e3/uL   NEUT# 1.9  1.5 - 6.5 10e3/uL   HGB 14.1  13.0 - 17.1 g/dL   HCT 16.1  09.6 - 04.5 %   Platelets 58 (*) 140 - 400 10e3/uL   MCV 92.3  79.3 - 98.0 fL   MCH 32.3  27.2 - 33.4 pg   MCHC 34.9  32.0 - 36.0 g/dL   RBC 4.09  8.11 - 9.14 10e6/uL   RDW 13.2  11.0 - 14.6 %   lymph# 1.0  0.9 - 3.3 10e3/uL   MONO# 0.3  0.1 - 0.9 10e3/uL  Eosinophils Absolute 0.1  0.0 - 0.5 10e3/uL   Basophils Absolute 0.0  0.0 - 0.1 10e3/uL   NEUT% 56.5  39.0 - 75.0 %   LYMPH% 30.0  14.0 - 49.0 %   MONO% 8.1  0.0 - 14.0 %   EOS% 4.3  0.0 - 7.0 %   BASO% 1.1  0.0 - 2.0 %  COMPREHENSIVE METABOLIC PANEL (CC13)      Result Value Range   Sodium 138  136 - 145 mEq/L   Potassium 4.2  3.5 - 5.1 mEq/L   Chloride 105  98 - 107 mEq/L   CO2 24  22 - 29 mEq/L   Glucose 100 (*) 70 - 99 mg/dl   BUN 6.3 (*) 7.0 - 21.3 mg/dL   Creatinine 0.9  0.7 - 1.3 mg/dL   Total Bilirubin 0.86  0.20 - 1.20 mg/dL   Alkaline Phosphatase 113  40 - 150 U/L   AST 40 (*) 5 - 34 U/L   ALT 29  0 - 55 U/L   Total Protein 7.8  6.4 - 8.3 g/dL   Albumin 2.9 (*) 3.5 - 5.0 g/dL   Calcium 8.7  8.4 - 57.8 mg/dL   thrombocytopenia is noted chronically followed by hematology oncology.  Assessment & Plan:    Shortness of breath - Plan: Ambulatory referral to Cardiology, Hepatic function panel, Basic metabolic panel, Brain natriuretic peptide, CANCELED: Brain natriuretic peptide Shortness of breath could represent underlying congestive heart failure, ischemia, or less likely pulmonary disease given that the patient has never had any sort of tobacco exposure. I am going to consult cardiology for their opinion on whether the patient needs a stress test, echocardiogram, abdominal aortic aneurysm screening, or all of the above. I appreciate their help in this case. I will check a BNP today.  Chest pain - Plan: Ambulatory referral to Cardiology, EKG 12-Lead, Hepatic function panel, Basic metabolic  panel, Brain natriuretic peptide  EKG: Normal sinus rhythm. Normal axis, normal R wave progression, No acute ST elevation or depression.  The patient's EKG appears normal, but as above potential ischemia as a source of chest pain is reasonable and they are in a patient with multiple cardiac risk factors.  Obstructive sleep apnea - Plan: Ambulatory referral to Pulmonology, Hepatic function panel, Basic metabolic panel, Brain natriuretic peptide History and body habitus is highly suggestive of sleep apnea. I appreciate sleep medicine input.  Edema - Plan: Ambulatory referral to Cardiology, Hepatic function panel, Basic metabolic panel, Brain natriuretic peptide, CANCELED: Basic metabolic panel, CANCELED: Hepatic function panel  Alcohol dependence - Plan: Hepatic function panel, Basic metabolic panel, Brain natriuretic peptide, CANCELED: Hepatic function panel The patient is pre-contemplative, and he has not in a place where he is willing to consider discontinuing alcohol.  Obesity, morbid.  HYPERTENSION, currently relatively stable  HYPERLIPIDEMIA, nonfasting.  Family history of abdominal aortic aneurysm  >50 minutes spent in face to face time with patient, >50% spent in counselling or coordination of care: Spent in discussing and evaluating these multiple conditions, discussing with the patient's wife and his sister and explaining to the best of my ability our plan of care and all disease states that could potentially be affected. Would also discussed the risk of not treating potential cardiac disease and sleep apnea.  Patient Instructions  REFERRAL: GO THE THE FRONT ROOM AT THE ENTRANCE OF OUR CLINIC, NEAR CHECK IN. ASK FOR LINDA SHE WILL HELP YOU SET UP YOUR REFERRAL. DATE: TIME:   SLEEP MEDICINE DOCTORS /  SLEEP STUDY CARDIOLOGY REFERRAL GO TO THE LAB TODAY   Orders Today:  Orders Placed This Encounter  Procedures  . Hepatic function panel  . Basic metabolic panel  . Brain  natriuretic peptide  . Ambulatory referral to Pulmonology  . Ambulatory referral to Cardiology  . EKG 12-Lead    New medications, updates to list, dose adjustments: Meds ordered this encounter  Medications  . fluticasone (FLONASE) 50 MCG/ACT nasal spray    Sig: Place 2 sprays into the nose as needed.    Signed,  Elpidio Galea. Aryiana Klinkner, MD, CAQ Sports Medicine  [facility] [address] [place] [address] Phone: [phone] Fax: [phone]  Updated Complete Medication List:   Medication List       This list is accurate as of: 05/24/13  5:41 PM.  Always use your most recent med list.               doxazosin 4 MG tablet  Commonly known as:  CARDURA  Take 1 tablet (4 mg total) by mouth at bedtime.     fluticasone 50 MCG/ACT nasal spray  Commonly known as:  FLONASE  Place 2 sprays into the nose as needed.     metoprolol tartrate 25 MG tablet  Commonly known as:  LOPRESSOR  TAKE ONE TABLET BY MOUTH TWICE DAILY

## 2013-05-25 LAB — BRAIN NATRIURETIC PEPTIDE: Brain Natriuretic Peptide: 66.6 pg/mL (ref 0.0–100.0)

## 2013-05-28 ENCOUNTER — Other Ambulatory Visit: Payer: Self-pay | Admitting: Internal Medicine

## 2013-05-28 ENCOUNTER — Encounter: Payer: Self-pay | Admitting: Internal Medicine

## 2013-05-29 NOTE — Telephone Encounter (Signed)
Pt request refill metoprolol to [facility]; Lyla Son said she will schedule CPX in Feb 2015. Pt advised refill done and transferred to schedule CPX.

## 2013-06-04 ENCOUNTER — Ambulatory Visit
Admission: RE | Admit: 2013-06-04 | Discharge: 2013-06-04 | Disposition: A | Discharge status: Home / Self Care | Payer: BC Managed Care – PPO | Source: Ambulatory Visit | Attending: Cardiology | Admitting: Cardiology

## 2013-06-04 ENCOUNTER — Encounter: Payer: Self-pay | Admitting: Cardiology

## 2013-06-04 ENCOUNTER — Encounter: Payer: Self-pay | Admitting: *Deleted

## 2013-06-04 ENCOUNTER — Ambulatory Visit (INDEPENDENT_AMBULATORY_CARE_PROVIDER_SITE_OTHER): Payer: BC Managed Care – PPO | Admitting: Cardiology

## 2013-06-04 ENCOUNTER — Other Ambulatory Visit: Payer: Self-pay | Admitting: Internal Medicine

## 2013-06-04 VITALS — BP 142/78 | HR 83 | Ht 67.0 in | Wt 267.0 lb

## 2013-06-04 DIAGNOSIS — R079 Chest pain, unspecified: Secondary | ICD-10-CM

## 2013-06-04 DIAGNOSIS — I10 Essential (primary) hypertension: Secondary | ICD-10-CM

## 2013-06-04 DIAGNOSIS — R0989 Other specified symptoms and signs involving the circulatory and respiratory systems: Secondary | ICD-10-CM

## 2013-06-04 DIAGNOSIS — I5031 Acute diastolic (congestive) heart failure: Secondary | ICD-10-CM

## 2013-06-04 LAB — BASIC METABOLIC PANEL
BUN: 7 mg/dL (ref 6–23)
CO2: 25 mEq/L (ref 19–32)
Calcium: 9 mg/dL (ref 8.4–10.5)
Chloride: 100 mEq/L (ref 96–112)
Creatinine, Ser: 0.9 mg/dL (ref 0.4–1.5)
GFR: 96.25 mL/min (ref >60.00)
Glucose, Bld: 121 mg/dL — ABNORMAL HIGH (ref 70–99)
Potassium: 3.5 mEq/L (ref 3.5–5.1)
Sodium: 133 mEq/L — ABNORMAL LOW (ref 135–145)

## 2013-06-04 LAB — CBC WITH DIFFERENTIAL/PLATELET
Basophils Absolute: 0 10*3/uL (ref 0.0–0.1)
Basophils Relative: 0.8 % (ref 0.0–3.0)
Eosinophils Absolute: 0.1 10*3/uL (ref 0.0–0.7)
Eosinophils Relative: 3 % (ref 0.0–5.0)
HCT: 40.8 % (ref 39.0–52.0)
Hemoglobin: 14.2 g/dL (ref 13.0–17.0)
Lymphocytes Relative: 23.4 % (ref 12.0–46.0)
Lymphs Abs: 0.9 10*3/uL (ref 0.7–4.0)
MCHC: 34.9 g/dL (ref 30.0–36.0)
MCV: 94.9 fl (ref 78.0–100.0)
Monocytes Absolute: 0.3 10*3/uL (ref 0.1–1.0)
Monocytes Relative: 9.5 % (ref 3.0–12.0)
Neutro Abs: 2.3 10*3/uL (ref 1.4–7.7)
Neutrophils Relative %: 63.3 % (ref 43.0–77.0)
Platelets: 55 10*3/uL — ABNORMAL LOW (ref 150.0–400.0)
RBC: 4.3 Mil/uL (ref 4.22–5.81)
RDW: 13.6 % (ref 11.5–14.6)
WBC: 3.6 10*3/uL — ABNORMAL LOW (ref 4.5–10.5)

## 2013-06-04 MED ORDER — METOPROLOL TARTRATE 50 MG PO TABS: 50.0000 mg | ORAL_TABLET | Freq: Two times a day (BID) | ORAL | Status: DC

## 2013-06-04 MED ORDER — FUROSEMIDE 40 MG PO TABS: 40.0000 mg | ORAL_TABLET | Freq: Every day | ORAL | Status: DC

## 2013-06-04 NOTE — Progress Notes (Signed)
Patient ID: THIERRY DOBOSZ, male   DOB: 08-22-1965, 47 y.o.   MRN: [id number]     Patient Name: CASON LUFFMAN Date of Encounter: 06/04/2013  Primary Care Provider:  Tillman Abide, MD Primary Cardiologist:  Tobias Alexander, M.D., [facility]  Problem List   Past Medical History  Diagnosis Date  . Allergy   . GERD (gastroesophageal reflux disease)   . Hyperlipidemia   . Hypertension   . Neuropathy    No past surgical history on file.  Allergies  Allergies  Allergen Reactions  . Hydrochlorothiazide W-Triamterene     REACTION: dizzy, nausea  . Lisinopril     REACTION: cough, decreased libido   HPI  47 year old male with history of hyperlipidemia, obesity, hypertension, significant drinking history who was recently seen by his PCP for complains of exertional SOB and chest pain. The patient describes DOE after walking 40-50 feet that is followed by chest tightness with radiation to his left arm. After he stops the symptoms resolve within 5 minutes. He denies resting chest pain.  The patient states that he also developed lower extremity edema. In the past he was prescribed Lasix with some improvement but was taken of it and he is not sure why. He has never smoked. He drinks 12-18 beers a day chronically and has known elevation of liver enzymes.  He also states that he snores and has significant sleep apnea at night. He is scheduled for a sleep study in January.  He denies any syncope.  His brother had myocardial infarction at age of 79. Multiple family members died of ruptured AAA. He has never been screened.  Home Medications  Prior to Admission medications   Medication Sig Start Date End Date Taking? Authorizing Provider  doxazosin (CARDURA) 4 MG tablet TAKE 1 TABLET (4 MG TOTAL) BY MOUTH AT BEDTIME. 06/04/13  Yes Karie Schwalbe, MD  fluticasone (FLONASE) 50 MCG/ACT nasal spray Place 2 sprays into the nose as needed. 07/11/12  Yes Karie Schwalbe, MD  metoprolol tartrate  (LOPRESSOR) 25 MG tablet TAKE ONE TABLET BY MOUTH TWICE DAILY 07/11/12  Yes Karie Schwalbe, MD    Family History  Family History  Problem Relation Age of Onset  . Stroke Mother   . Hypertension Father   . Heart disease Father   . Cancer Maternal Aunt     Lung    Social History  History   Social History  . Marital Status: Married    Spouse Name: N/A    Number of Children: 2  . Years of Education: N/A   Occupational History  . [employer]     no work lately   Social History Main Topics  . Smoking status: Never Smoker   . Smokeless tobacco: Current User    Types: Chew     Comment: has been trying to cut back  . Alcohol Use: Yes  . Drug Use: Not on file  . Sexual Activity: Not on file   Other Topics Concern  . Not on file   Social History Narrative   Dips   Heavy alcohol     Review of Systems, as per HPI, otherwise negative General:  No chills, fever, night sweats or weight changes.  Cardiovascular:  No chest pain, dyspnea on exertion, edema, orthopnea, palpitations, paroxysmal nocturnal dyspnea. Dermatological: No rash, lesions/masses Respiratory: No cough, dyspnea Urologic: No hematuria, dysuria Abdominal:   No nausea, vomiting, diarrhea, bright red blood per rectum, melena, or hematemesis Neurologic:  No visual  changes, wkns, changes in mental status. All other systems reviewed and are otherwise negative except as noted above.  Physical Exam  Blood pressure 142/78, pulse 83, height 5\' 7"  (1.702 m), weight 267 lb (121.11 kg).  General: Pleasant, NAD, obese Psych: Normal affect. Neuro: Alert and oriented X 3. Moves all extremities spontaneously. HEENT: Normal  Neck: Supple without bruits or JVD. Lungs:  Resp regular and unlabored, CTA. Heart: RRR no s3, s4, or murmurs. Abdomen: Soft, non-tender, non-distended, BS + x 4.  Extremities: No clubbing, cyanosis, non-pitting edema up to the knees. DP/PT/Radials 2+ and equal bilaterally.  Labs:  No  results found for this basename: CKTOTAL, CKMB, TROPONINI,  in the last 72 hours Lab Results  Component Value Date   WBC 3.3* 08/02/2012   HGB 14.1 08/02/2012   HCT 40.3 08/02/2012   MCV 92.3 08/02/2012   PLT 58* 08/02/2012   No results found for this basename: NA, K, CL, CO2, BUN, CREATININE, CALCIUM, LABALBU, PROT, BILITOT, ALKPHOS, ALT, AST, GLUCOSE,  in the last 168 hours Lab Results  Component Value Date   CHOL 206* 11/16/2011   HDL 47.30 11/16/2011   LDLCALC 123* 05/27/2009   TRIG 242.0* 11/16/2011   Accessory Clinical Findings  echocardiogram  ECG - normal sinus rhythm, 83 beats per minute, left posterior fascicular block, T-wave abnormalities with possible inferior ischemia. Abnormal EKG.   Assessment & Plan  A 47 year old male with  1. Typical exertional chest pain - abnormal ECG suggesting possible inferior ischemia, multiple risk factors, including obesity, HLP, HTN. He is considered a hight risk and we will schedule him for a left cardiac cath on Monday 06/10/13.  2. Heart failure - NYHA II-III, no echocardiogram, unknown etiology, based on his symptoms and abnormal ECG it might be ischemic, however considering significant etoh abuse (12-18 beers a day) a non-ischemic etio should be considered. We will schedule him for a right sided cath in addition to the left sided cath. We will start him on Lasix 40 mg po daily, he is advised to hold a day before his cath  3. Hypertension - intolerant to ACEI, currently on Metoprolol 25 mg po BID, we will increase to 50 mg PO BID  4. Hyperlipidemia - high TAG, possibly secondary to etoh abuse, elevated AST, we will consider Zetia, omega 3 acids   5. OSA - a sleep study scheduled for January 2015  6. Etoh abuse - conselled  7. Obesity - we will refer him to a cardiac rehab once he recovers from acute CHF  8. Family h/o rusptured AAA in multiple family members - we will oredr AAA [name]   9. Possible TOS - upper extremity numbness when  elevated, we will order UE arterial US at the next visit after the cath  Follow up in 2 weeks  Lars Masson, MD, [facility] 06/04/2013, 12:17 PM

## 2013-06-04 NOTE — Patient Instructions (Addendum)
Your physician has requested that you have a cardiac catheterization. Cardiac catheterization is used to diagnose and/or treat various heart conditions. Doctors may recommend this procedure for a number of different reasons. The most common reason is to evaluate chest pain. Chest pain can be a symptom of coronary artery disease (CAD), and cardiac catheterization can show whether plaque is narrowing or blocking your heart's arteries. This procedure is also used to evaluate the valves, as well as measure the blood flow and oxygen levels in different parts of your heart. For further information please visit [email or web address] Please follow instruction sheet, as given.  Your physician has requested that you have an abdominal aorta duplex. During this test, an ultrasound is used to evaluate the aorta. Allow 30 minutes for this exam. Do not eat after midnight the day before and avoid carbonated beverages  Your physician recommends that you schedule a follow-up appointment in: 2 WEEKS AFTER CATH  START LASIX 40 MG DAILY  START METOPROLOL 50 MG TWICE A DAY  HOLD LASIX 12/13 & 12/14

## 2013-06-05 ENCOUNTER — Ambulatory Visit: Payer: BC Managed Care – PPO

## 2013-06-05 DIAGNOSIS — I10 Essential (primary) hypertension: Secondary | ICD-10-CM

## 2013-06-05 DIAGNOSIS — R079 Chest pain, unspecified: Secondary | ICD-10-CM

## 2013-06-05 DIAGNOSIS — I5031 Acute diastolic (congestive) heart failure: Secondary | ICD-10-CM

## 2013-06-06 LAB — PROTIME-INR
INR: 1.4 (ref <1.50)
Prothrombin Time: 16.9 seconds — ABNORMAL HIGH (ref 11.6–15.2)

## 2013-06-10 ENCOUNTER — Encounter (HOSPITAL_COMMUNITY)
Admission: RE | Disposition: A | Discharge status: Home / Self Care | Payer: Self-pay | Source: Ambulatory Visit | Attending: Cardiovascular Disease

## 2013-06-10 ENCOUNTER — Ambulatory Visit (HOSPITAL_COMMUNITY)
Admission: RE | Admit: 2013-06-10 | Discharge: 2013-06-10 | Disposition: A | Discharge status: Home / Self Care | Payer: BC Managed Care – PPO | Source: Ambulatory Visit | Attending: Cardiovascular Disease | Admitting: Cardiovascular Disease

## 2013-06-10 DIAGNOSIS — G4733 Obstructive sleep apnea (adult) (pediatric): Secondary | ICD-10-CM | POA: Insufficient documentation

## 2013-06-10 DIAGNOSIS — I10 Essential (primary) hypertension: Secondary | ICD-10-CM | POA: Insufficient documentation

## 2013-06-10 DIAGNOSIS — R0602 Shortness of breath: Secondary | ICD-10-CM

## 2013-06-10 DIAGNOSIS — G589 Mononeuropathy, unspecified: Secondary | ICD-10-CM | POA: Insufficient documentation

## 2013-06-10 DIAGNOSIS — R9431 Abnormal electrocardiogram [ECG] [EKG]: Secondary | ICD-10-CM | POA: Insufficient documentation

## 2013-06-10 DIAGNOSIS — Z79899 Other long term (current) drug therapy: Secondary | ICD-10-CM | POA: Insufficient documentation

## 2013-06-10 DIAGNOSIS — F1010 Alcohol abuse, uncomplicated: Secondary | ICD-10-CM | POA: Insufficient documentation

## 2013-06-10 DIAGNOSIS — R079 Chest pain, unspecified: Secondary | ICD-10-CM | POA: Insufficient documentation

## 2013-06-10 DIAGNOSIS — E785 Hyperlipidemia, unspecified: Secondary | ICD-10-CM | POA: Insufficient documentation

## 2013-06-10 DIAGNOSIS — Z8249 Family history of ischemic heart disease and other diseases of the circulatory system: Secondary | ICD-10-CM | POA: Insufficient documentation

## 2013-06-10 DIAGNOSIS — K219 Gastro-esophageal reflux disease without esophagitis: Secondary | ICD-10-CM | POA: Insufficient documentation

## 2013-06-10 HISTORY — PX: LEFT AND RIGHT HEART CATHETERIZATION WITH CORONARY ANGIOGRAM: SHX5449

## 2013-06-10 LAB — POCT I-STAT 3, ART BLOOD GAS (G3+)
Acid-Base Excess: 4 mmol/L — ABNORMAL HIGH (ref 0.0–2.0)
Bicarbonate: 24.5 mEq/L — ABNORMAL HIGH (ref 20.0–24.0)
TCO2: 25 mmol/L (ref 0–100)
pO2, Arterial: 143 mmHg — ABNORMAL HIGH (ref 80.0–100.0)

## 2013-06-10 SURGERY — LEFT AND RIGHT HEART CATHETERIZATION WITH CORONARY ANGIOGRAM
Anesthesia: LOCAL

## 2013-06-10 MED ORDER — LIDOCAINE HCL (PF) 1 % IJ SOLN
INTRAMUSCULAR | Status: AC
  Filled 2013-06-10: qty 30

## 2013-06-10 MED ORDER — ASPIRIN 81 MG PO CHEW: 324.0000 mg | CHEWABLE_TABLET | ORAL | Status: DC

## 2013-06-10 MED ORDER — FENTANYL CITRATE 0.05 MG/ML IJ SOLN
INTRAMUSCULAR | Status: AC
  Filled 2013-06-10: qty 2

## 2013-06-10 MED ORDER — MIDAZOLAM HCL 2 MG/2ML IJ SOLN
INTRAMUSCULAR | Status: AC
  Filled 2013-06-10: qty 2

## 2013-06-10 MED ORDER — SODIUM CHLORIDE 0.9 % IV SOLN: 250.0000 mL | INTRAVENOUS | Status: DC | PRN

## 2013-06-10 MED ORDER — SODIUM CHLORIDE 0.9 % IV SOLN: INTRAVENOUS | Status: DC

## 2013-06-10 MED ORDER — VERAPAMIL HCL 2.5 MG/ML IV SOLN
INTRAVENOUS | Status: AC
  Filled 2013-06-10: qty 2

## 2013-06-10 MED ORDER — ONDANSETRON HCL 4 MG/2ML IJ SOLN: 4.0000 mg | Freq: Four times a day (QID) | INTRAMUSCULAR | Status: DC | PRN

## 2013-06-10 MED ORDER — NITROGLYCERIN 0.2 MG/ML ON CALL CATH LAB
INTRAVENOUS | Status: AC
  Filled 2013-06-10: qty 1

## 2013-06-10 MED ORDER — HEPARIN (PORCINE) IN NACL 2-0.9 UNIT/ML-% IJ SOLN
INTRAMUSCULAR | Status: AC
  Filled 2013-06-10: qty 1500

## 2013-06-10 MED ORDER — HEPARIN SODIUM (PORCINE) 1000 UNIT/ML IJ SOLN
INTRAMUSCULAR | Status: AC
  Filled 2013-06-10: qty 1

## 2013-06-10 MED ORDER — SODIUM CHLORIDE 0.9 % IJ SOLN: 3.0000 mL | Freq: Two times a day (BID) | INTRAMUSCULAR | Status: DC

## 2013-06-10 MED ORDER — SODIUM CHLORIDE 0.9 % IV SOLN: 1.0000 mL/kg/h | INTRAVENOUS | Status: DC

## 2013-06-10 MED ORDER — ACETAMINOPHEN 325 MG PO TABS: 650.0000 mg | ORAL_TABLET | ORAL | Status: DC | PRN

## 2013-06-10 MED ORDER — SODIUM CHLORIDE 0.9 % IJ SOLN: 3.0000 mL | INTRAMUSCULAR | Status: DC | PRN

## 2013-06-10 NOTE — Progress Notes (Signed)
Discharge instruction given per MD order.  Pt and CG able to verbalize understanding.  Pt to car via wheelchair. 

## 2013-06-10 NOTE — H&P (View-Only) (Signed)
Patient ID: Colton Porter, male   DOB: 07/18/1965, 47 y.o.   MRN: [id number]     Patient Name: Colton Porter Date of Encounter: 06/04/2013  Primary Care Provider:  Richard Letvak, MD Primary Cardiologist:  Ruari Duggan, M.D., FACC  Problem List   Past Medical History  Diagnosis Date  . Allergy   . GERD (gastroesophageal reflux disease)   . Hyperlipidemia   . Hypertension   . Neuropathy    No past surgical history on file.  Allergies  Allergies  Allergen Reactions  . Hydrochlorothiazide W-Triamterene     REACTION: dizzy, nausea  . Lisinopril     REACTION: cough, decreased libido   HPI  47-year-old male with history of hyperlipidemia, obesity, hypertension, significant drinking history who was recently seen by his PCP for complains of exertional SOB and chest pain. The patient describes DOE after walking 40-50 feet that is followed by chest tightness with radiation to his left arm. After he stops the symptoms resolve within 5 minutes. He denies resting chest pain.  The patient states that he also developed lower extremity edema. In the past he was prescribed Lasix with some improvement but was taken of it and he is not sure why. He has never smoked. He drinks 12-18 beers a day chronically and has known elevation of liver enzymes.  He also states that he snores and has significant sleep apnea at night. He is scheduled for a sleep study in January.  He denies any syncope.  His brother had myocardial infarction at age of 55. Multiple family members died of ruptured AAA. He has never been screened.  Home Medications  Prior to Admission medications   Medication Sig Start Date End Date Taking? Authorizing Provider  doxazosin (CARDURA) 4 MG tablet TAKE 1 TABLET (4 MG TOTAL) BY MOUTH AT BEDTIME. 06/04/13  Yes Richard I Letvak, MD  fluticasone (FLONASE) 50 MCG/ACT nasal spray Place 2 sprays into the nose as needed. 07/11/12  Yes Richard I Letvak, MD  metoprolol tartrate  (LOPRESSOR) 25 MG tablet TAKE ONE TABLET BY MOUTH TWICE DAILY 07/11/12  Yes Richard I Letvak, MD    Family History  Family History  Problem Relation Age of Onset  . Stroke Mother   . Hypertension Father   . Heart disease Father   . Cancer Maternal Aunt     Lung    Social History  History   Social History  . Marital Status: Married    Spouse Name: N/A    Number of Children: 2  . Years of Education: N/A   Occupational History  . [employer]     no work lately   Social History Main Topics  . Smoking status: Never Smoker   . Smokeless tobacco: Current User    Types: Chew     Comment: has been trying to cut back  . Alcohol Use: Yes  . Drug Use: Not on file  . Sexual Activity: Not on file   Other Topics Concern  . Not on file   Social History Narrative   Dips   Heavy alcohol     Review of Systems, as per HPI, otherwise negative General:  No chills, fever, night sweats or weight changes.  Cardiovascular:  No chest pain, dyspnea on exertion, edema, orthopnea, palpitations, paroxysmal nocturnal dyspnea. Dermatological: No rash, lesions/masses Respiratory: No cough, dyspnea Urologic: No hematuria, dysuria Abdominal:   No nausea, vomiting, diarrhea, bright red blood per rectum, melena, or hematemesis Neurologic:  No visual   changes, wkns, changes in mental status. All other systems reviewed and are otherwise negative except as noted above.  Physical Exam  Blood pressure 142/78, pulse 83, height 5' 7" (1.702 m), weight 267 lb (121.11 kg).  General: Pleasant, NAD, obese Psych: Normal affect. Neuro: Alert and oriented X 3. Moves all extremities spontaneously. HEENT: Normal  Neck: Supple without bruits or JVD. Lungs:  Resp regular and unlabored, CTA. Heart: RRR no s3, s4, or murmurs. Abdomen: Soft, non-tender, non-distended, BS + x 4.  Extremities: No clubbing, cyanosis, non-pitting edema up to the knees. DP/PT/Radials 2+ and equal bilaterally.  Labs:  No  results found for this basename: CKTOTAL, CKMB, TROPONINI,  in the last 72 hours Lab Results  Component Value Date   WBC 3.3* 08/02/2012   HGB 14.1 08/02/2012   HCT 40.3 08/02/2012   MCV 92.3 08/02/2012   PLT 58* 08/02/2012   No results found for this basename: NA, K, CL, CO2, BUN, CREATININE, CALCIUM, LABALBU, PROT, BILITOT, ALKPHOS, ALT, AST, GLUCOSE,  in the last 168 hours Lab Results  Component Value Date   CHOL 206* 11/16/2011   HDL 47.30 11/16/2011   LDLCALC 123* 05/27/2009   TRIG 242.0* 11/16/2011   Accessory Clinical Findings  echocardiogram  ECG - normal sinus rhythm, 83 beats per minute, left posterior fascicular block, T-wave abnormalities with possible inferior ischemia. Abnormal EKG.   Assessment & Plan  A 47 year old male with  1. Typical exertional chest pain - abnormal ECG suggesting possible inferior ischemia, multiple risk factors, including obesity, HLP, HTN. He is considered a hight risk and we will schedule him for a left cardiac cath on Monday 06/10/13.  2. Heart failure - NYHA II-III, no echocardiogram, unknown etiology, based on his symptoms and abnormal ECG it might be ischemic, however considering significant etoh abuse (12-18 beers a day) a non-ischemic etio should be considered. We will schedule him for a right sided cath in addition to the left sided cath. We will start him on Lasix 40 mg po daily, he is advised to hold a day before his cath  3. Hypertension - intolerant to ACEI, currently on Metoprolol 25 mg po BID, we will increase to 50 mg PO BID  4. Hyperlipidemia - high TAG, possibly secondary to etoh abuse, elevated AST, we will consider Zetia, omega 3 acids   5. OSA - a sleep study scheduled for January 2015  6. Etoh abuse - conselled  7. Obesity - we will refer him to a cardiac rehab once he recovers from acute CHF  8. Family h/o rusptured AAA in multiple family members - we will oredr AAA US   9. Possible TOS - upper extremity numbness when  elevated, we will order UE arterial US at the next visit after the cath  Follow up in 2 weeks  Jaxsun Ciampi, H, MD, FACC 06/04/2013, 12:17 PM     

## 2013-06-10 NOTE — Progress Notes (Signed)
Received form cath procedure alert and able to tolerate fluids.  Pt denies any discomfort at this time.

## 2013-06-10 NOTE — CV Procedure (Signed)
   Cardiac Catheterization Procedure Note  Name: Colton Porter MRN: [id number] DOB: 04-21-1966  Procedure: Right Heart Cath, Left Heart Cath, Selective Coronary Angiography, LV angiography  Indication: Shortness of breath, exertional chest pain. 47 year-old male with heavy Etoh presenting with above symptoms. Referred for cardiac cath with abnormal EKG and high pretest probablility.   Procedural Details: The right wrist was prepped, draped, and anesthetized with 1% lidocaine. Using the modified Seldinger technique a 5/6 French sheath was placed in the right radial artery. The right antecubital area was prepped and draped. A 5 Fr sheath was placed in the antecubital vein after changing out from an antecubital IV. A Swan-Ganz catheter was used for the right heart catheterization. Standard protocol was followed for recording of right heart pressures and sampling of oxygen saturations. Fick cardiac output was calculated. Standard Judkins catheters were used for selective coronary angiography and left ventriculography. There were no immediate procedural complications. The patient was transferred to the post catheterization recovery area for further monitoring.  Procedural Findings: Hemodynamics RA a wave 14, v wave 13, mean 13 RV 34/15 PA 37/24 mean 30 PCWP a wave 23 v wave 25, mean 22 LV 131/26 AO 131/79 mean 104  Oxygen saturations: PA not recorded AO 100  Cardiac Output (Fick)   Cardiac Index (Fick)    Coronary angiography: Coronary dominance: right  Left mainstem: Widely patent.   Left anterior descending (LAD): Reaches the LV apex. The LAD gives off 2 diagonals without significant disease.   Left circumflex (LCx): Patent. Intermediate and 2 OM branches have no significant stenosis.   Right coronary artery (RCA): Dominant vessel. Divides into PDA and PLA branches. No obstructive disease.  Left ventriculography: Left ventricular systolic function is normal, LVEF is estimated at  55-65%, there is no significant mitral regurgitation   Final Conclusions:   1. Normal LV function with elevated LVEDP 2. Normal coronary arteries 3. Elevated right heart pressures suspect secondary to diastolic dysfunction (transpulmonic gradient less than 10 mm Hg)  Recommendations: lifestyle modification, medical therapy for CHF  Tonny Bollman 06/10/2013, 2:19 PM

## 2013-06-10 NOTE — Interval H&P Note (Signed)
History and Physical Interval Note:  06/10/2013 1:10 PM  Colton Porter  has presented today for surgery, with the diagnosis of cp  The various methods of treatment have been discussed with the patient and family. After consideration of risks, benefits and other options for treatment, the patient has consented to  Procedure(s): LEFT AND RIGHT HEART CATHETERIZATION WITH CORONARY ANGIOGRAM (N/A) as a surgical intervention .  The patient's history has been reviewed, patient examined, no change in status, stable for surgery.  I have reviewed the patient's chart and labs.  Questions were answered to the patient's satisfaction.    Cath Lab Visit (complete for each Cath Lab visit)  Clinical Evaluation Leading to the Procedure:   ACS: no  Non-ACS:    Anginal Classification: CCS III  Anti-ischemic medical therapy: Minimal Therapy (1 class of medications)  Non-Invasive Test Results: No non-invasive testing performed  Prior CABG: No previous CABG       Tonny Bollman

## 2013-06-14 ENCOUNTER — Ambulatory Visit (HOSPITAL_COMMUNITY): Payer: BC Managed Care – PPO | Attending: Cardiology

## 2013-06-14 ENCOUNTER — Encounter: Payer: Self-pay | Admitting: Cardiology

## 2013-06-14 DIAGNOSIS — I2510 Atherosclerotic heart disease of native coronary artery without angina pectoris: Secondary | ICD-10-CM | POA: Insufficient documentation

## 2013-06-14 DIAGNOSIS — R0989 Other specified symptoms and signs involving the circulatory and respiratory systems: Secondary | ICD-10-CM | POA: Insufficient documentation

## 2013-06-14 DIAGNOSIS — I10 Essential (primary) hypertension: Secondary | ICD-10-CM | POA: Insufficient documentation

## 2013-06-14 DIAGNOSIS — E785 Hyperlipidemia, unspecified: Secondary | ICD-10-CM | POA: Insufficient documentation

## 2013-06-14 DIAGNOSIS — F1010 Alcohol abuse, uncomplicated: Secondary | ICD-10-CM | POA: Insufficient documentation

## 2013-06-14 DIAGNOSIS — Z8249 Family history of ischemic heart disease and other diseases of the circulatory system: Secondary | ICD-10-CM | POA: Insufficient documentation

## 2013-06-17 ENCOUNTER — Telehealth: Payer: Self-pay | Admitting: *Deleted

## 2013-06-17 NOTE — Telephone Encounter (Signed)
lmtcb for results, number provided

## 2013-06-25 ENCOUNTER — Other Ambulatory Visit: Payer: Self-pay

## 2013-06-25 ENCOUNTER — Encounter: Payer: Self-pay | Admitting: Cardiology

## 2013-06-25 ENCOUNTER — Ambulatory Visit (INDEPENDENT_AMBULATORY_CARE_PROVIDER_SITE_OTHER): Payer: BC Managed Care – PPO | Admitting: Cardiology

## 2013-06-25 VITALS — BP 117/77 | HR 70 | Ht 67.0 in | Wt 260.8 lb

## 2013-06-25 DIAGNOSIS — I10 Essential (primary) hypertension: Secondary | ICD-10-CM

## 2013-06-25 DIAGNOSIS — I5031 Acute diastolic (congestive) heart failure: Secondary | ICD-10-CM

## 2013-06-25 DIAGNOSIS — E785 Hyperlipidemia, unspecified: Secondary | ICD-10-CM

## 2013-06-25 LAB — COMPREHENSIVE METABOLIC PANEL
ALT: 35 U/L (ref 0–53)
AST: 46 U/L — ABNORMAL HIGH (ref 0–37)
Albumin: 3 g/dL — ABNORMAL LOW (ref 3.5–5.2)
Alkaline Phosphatase: 77 U/L (ref 39–117)
BUN: 5 mg/dL — ABNORMAL LOW (ref 6–23)
CO2: 29 mEq/L (ref 19–32)
Calcium: 8.5 mg/dL (ref 8.4–10.5)
Chloride: 103 mEq/L (ref 96–112)
Creatinine, Ser: 1 mg/dL (ref 0.4–1.5)
GFR: 86.2 mL/min (ref >60.00)
Glucose, Bld: 90 mg/dL (ref 70–99)
Potassium: 3.3 mEq/L — ABNORMAL LOW (ref 3.5–5.1)
Sodium: 139 mEq/L (ref 135–145)
Total Bilirubin: 0.7 mg/dL (ref 0.3–1.2)
Total Protein: 7.5 g/dL (ref 6.0–8.3)

## 2013-06-25 MED ORDER — POTASSIUM CHLORIDE ER 10 MEQ PO TBCR: 10.0000 meq | EXTENDED_RELEASE_TABLET | Freq: Every day | ORAL | Status: DC

## 2013-06-25 NOTE — Patient Instructions (Signed)
Lab Work today  ( cmp )   Your physician recommends that you schedule a follow-up appointment in: 2 weeks

## 2013-06-25 NOTE — Progress Notes (Signed)
Patient ID: Colton Porter, male   DOB: [birth year], 47 y.o.   MRN: [id number]     Patient Name: Colton Porter Date of Encounter: 06/25/2013  Primary Care Provider:  Tillman Abide, MD Primary Cardiologist:  Tobias Alexander, M.D., [facility]  Problem List   Past Medical History  Diagnosis Date  . Allergy   . GERD (gastroesophageal reflux disease)   . Hyperlipidemia   . Hypertension   . Neuropathy    No past surgical history on file.  Allergies  Allergies  Allergen Reactions  . Hydrochlorothiazide W-Triamterene     REACTION: dizzy, nausea  . Lisinopril     REACTION: cough, decreased libido   HPI  47 year old male with history of hyperlipidemia, obesity, hypertension, significant drinking history who was recently seen by his PCP for complains of exertional SOB and chest pain. The patient describes DOE after walking 40-50 feet that is followed by chest tightness with radiation to his left arm. After he stops the symptoms resolve within 5 minutes. He denies resting chest pain.  The patient states that he also developed lower extremity edema. In the past he was prescribed Lasix with some improvement but was taken of it and he is not sure why. He has never smoked. He drinks 12-18 beers a day chronically and has known elevation of liver enzymes.  He also states that he snores and has significant sleep apnea at night. He is scheduled for a sleep study in January.  He denies any syncope.  His brother had myocardial infarction at age of 80. Multiple family members died of ruptured AAA. He has never been screened.  This is a 2 week follow up. Significant improvement in LE edema, still feels SOB. He has had a weak of whole body pain, productive cough.   Home Medications  Prior to Admission medications   Medication Sig Start Date End Date Taking? Authorizing Provider  doxazosin (CARDURA) 4 MG tablet TAKE 1 TABLET (4 MG TOTAL) BY MOUTH AT BEDTIME. 06/04/13  Yes Karie Schwalbe, MD    fluticasone (FLONASE) 50 MCG/ACT nasal spray Place 2 sprays into the nose as needed. 07/11/12  Yes Karie Schwalbe, MD  metoprolol tartrate (LOPRESSOR) 25 MG tablet TAKE ONE TABLET BY MOUTH TWICE DAILY 07/11/12  Yes Karie Schwalbe, MD    Family History  Family History  Problem Relation Age of Onset  . Stroke Mother   . Hypertension Father   . Heart disease Father   . Cancer Maternal Aunt     Lung    Social History  History   Social History  . Marital Status: Married    Spouse Name: N/A    Number of Children: 2  . Years of Education: N/A   Occupational History  . [employer]     no work lately   Social History Main Topics  . Smoking status: Never Smoker   . Smokeless tobacco: Current User    Types: Chew     Comment: has been trying to cut back  . Alcohol Use: Yes  . Drug Use: Not on file  . Sexual Activity: Not on file   Other Topics Concern  . Not on file   Social History Narrative   Dips   Heavy alcohol     Review of Systems, as per HPI, otherwise negative General:  No chills, fever, night sweats or weight changes.  Cardiovascular:  No chest pain, dyspnea on exertion, edema, orthopnea, palpitations, paroxysmal nocturnal dyspnea. Dermatological: No  rash, lesions/masses Respiratory: No cough, dyspnea Urologic: No hematuria, dysuria Abdominal:   No nausea, vomiting, diarrhea, bright red blood per rectum, melena, or hematemesis Neurologic:  No visual changes, wkns, changes in mental status. All other systems reviewed and are otherwise negative except as noted above.  Physical Exam  Blood pressure 117/77, pulse 70, height 5\' 7"  (1.702 m), weight 260 lb 12.8 oz (118.298 kg).  General: Pleasant, NAD, obese Psych: Normal affect. Neuro: Alert and oriented X 3. Moves all extremities spontaneously. HEENT: Normal  Neck: Supple without bruits or JVD. Lungs:  Resp regular and unlabored, wheezes Heart: RRR no s3, s4, or murmurs. Abdomen: Soft, non-tender,  non-distended, BS + x 4.  Extremities: No clubbing, cyanosis, non-pitting edema at the ankles. DP/PT/Radials 2+ and equal bilaterally.  Labs:  No results found for this basename: CKTOTAL, CKMB, TROPONINI,  in the last 72 hours Lab Results  Component Value Date   WBC 3.6* 06/04/2013   HGB 14.2 06/04/2013   HCT 40.8 06/04/2013   MCV 94.9 06/04/2013   PLT 55.0* 06/04/2013   No results found for this basename: NA, K, CL, CO2, BUN, CREATININE, CALCIUM, LABALBU, PROT, BILITOT, ALKPHOS, ALT, AST, GLUCOSE,  in the last 168 hours Lab Results  Component Value Date   CHOL 206* 11/16/2011   HDL 47.30 11/16/2011   LDLCALC 123* 05/27/2009   TRIG 242.0* 11/16/2011   Accessory Clinical Findings  echocardiogram  ECG - normal sinus rhythm, 83 beats per minute, left posterior fascicular block, T-wave abnormalities with possible inferior ischemia. Abnormal EKG.  Left and right cardiac cath: 06/10/13 Final Conclusions:  1. Normal LV function with elevated LVEDP  2. Normal coronary arteries  3. Elevated right heart pressures suspect secondary to diastolic dysfunction (transpulmonic gradient less than 10 mm Hg)  Recommendations: lifestyle modification, medical therapy for CHF  Tonny Bollman  06/10/2013, 2:19 PM    Assessment & Plan  A 46 year old male with  1. Typical exertional chest pain - abnormal ECG suggesting possible inferior ischemia, multiple risk factors, including obesity, HLP, HTN. He was considered a hight risk and he underwent a cath that was normal. LVEF normal, but elevated LVEDP.  2. Chronic diastolic CHF - NYHA II-III, non-ischemic, preserved LVEF, elevated LVEDP.  Improvement in LE edema with Lasix but still SOB, however currently sick with URI.  We will continue Lasix 40 mg po daily, check CMP today.   3. Hypertension - controlled after increasing Metoprolol to 50 mg po bid, intolerant to ACEI,   4. Hyperlipidemia - high TAG, possibly secondary to etoh abuse, elevated AST, we  will consider Zetia, omega 3 acids   5. OSA - a sleep study scheduled for January 2015  6. Etoh abuse - counselled  7. Obesity - we will refer him to a cardiac rehab once he recovers from acute CHF  8. Family h/o ruptured AAA in multiple family members - [name] negative  9. Possible TOS - upper extremity numbness when elevated, we will order UE arterial US at the next visit   Follow up in 2 months  Lars Masson, MD, [facility] 06/25/2013, 8:17 AM

## 2013-06-25 NOTE — Addendum Note (Signed)
Addended by: Tonita Phoenix on: 06/25/2013 08:31 AM   Modules accepted: Orders

## 2013-06-26 ENCOUNTER — Telehealth: Payer: Self-pay

## 2013-06-26 MED ORDER — BENZONATATE 200 MG PO CAPS: 200.0000 mg | ORAL_CAPSULE | Freq: Three times a day (TID) | ORAL | Status: DC | PRN

## 2013-06-26 NOTE — Telephone Encounter (Signed)
Spoke with pt's sister and advised results

## 2013-06-26 NOTE — Telephone Encounter (Signed)
Okay to send Rx for benzonatate 200mg  tid prn #30 x 0

## 2013-06-26 NOTE — Telephone Encounter (Signed)
Ann pts sister said pt has prod cough with ? Color of phlegm; pt has coughed for 8 days, pt has pain in lt side near ribs; pt is not sure if cracked ribs or pulled muscle. Pt has chest congestion, some wheezing. Pt is not taking any cough med. No fever,SOB or CP. Pt is able to be up and moving around. Ann request cough med until 06/28/13 appt with Dr Ermalene Searing. [facility]. Ann request cb.

## 2013-06-28 ENCOUNTER — Encounter: Payer: Self-pay | Admitting: Family Medicine

## 2013-06-28 ENCOUNTER — Ambulatory Visit (INDEPENDENT_AMBULATORY_CARE_PROVIDER_SITE_OTHER): Payer: BC Managed Care – PPO | Admitting: Family Medicine

## 2013-06-28 VITALS — BP 114/70 | HR 80 | Temp 98.5°F | Ht 67.0 in | Wt 258.2 lb

## 2013-06-28 DIAGNOSIS — B9789 Other viral agents as the cause of diseases classified elsewhere: Principal | ICD-10-CM

## 2013-06-28 DIAGNOSIS — J069 Acute upper respiratory infection, unspecified: Secondary | ICD-10-CM | POA: Insufficient documentation

## 2013-06-28 MED ORDER — GUAIFENESIN-CODEINE 100-10 MG/5ML PO SYRP: 5.0000 mL | ORAL_SOLUTION | Freq: Every evening | ORAL | Status: DC | PRN

## 2013-06-28 NOTE — Patient Instructions (Addendum)
Rest, fluids. Can use mucinex DM during day... No decongestant. Can use cough suppressant at night as needed. Do not use with alcohol. Expect continued daily improvement over the next week. Call if new fever, shortness of breath.

## 2013-06-28 NOTE — Progress Notes (Signed)
   Subjective:    Patient ID: Colton Porter, male    DOB: 03-24-1966, 48 y.o.   MRN: [id number]  Cough This is a new problem. The current episode started 1 to 4 weeks ago. The problem has been gradually worsening. The cough is productive of sputum. Associated symptoms include ear congestion, ear pain, a fever, myalgias, nasal congestion and wheezing. Pertinent negatives include no heartburn, postnasal drip, sore throat or shortness of breath. Associated symptoms comments: Right ear pain off and on  initial fever 102-103 now resolved Streaks of blood in mucus. Risk factors: nonsmoker. Treatments tried: tessalon perles, advil. The treatment provided moderate relief. There is no history of asthma, bronchiectasis, bronchitis, COPD, emphysema, environmental allergies or pneumonia.    Left sided abdominal pain with coughing.Marland Kitchen Resolved now   Review of Systems  Constitutional: Positive for fever.  HENT: Positive for ear pain. Negative for postnasal drip and sore throat.   Respiratory: Positive for cough and wheezing. Negative for shortness of breath.   Gastrointestinal: Negative for heartburn.  Musculoskeletal: Positive for myalgias.  Allergic/Immunologic: Negative for environmental allergies.       Objective:   Physical Exam  Constitutional: Vital signs are normal. He appears well-developed and well-nourished.  Non-toxic appearance. He does not appear ill. No distress.  HENT:  Head: Normocephalic and atraumatic.  Right Ear: Hearing, tympanic membrane, external ear and ear canal normal. No tenderness. No foreign bodies. Tympanic membrane is not retracted and not bulging.  Left Ear: Hearing, tympanic membrane, external ear and ear canal normal. No tenderness. No foreign bodies. Tympanic membrane is not retracted and not bulging.  Nose: Nose normal. No mucosal edema or rhinorrhea. Right sinus exhibits no maxillary sinus tenderness and no frontal sinus tenderness. Left sinus exhibits no  maxillary sinus tenderness and no frontal sinus tenderness.  Mouth/Throat: Uvula is midline, oropharynx is clear and moist and mucous membranes are normal. Normal dentition. No dental caries. No oropharyngeal exudate or tonsillar abscesses.  Eyes: Conjunctivae, EOM and lids are normal. Pupils are equal, round, and reactive to light. Lids are everted and swept, no foreign bodies found.  Neck: Trachea normal, normal range of motion and phonation normal. Neck supple. Carotid bruit is not present. No mass and no thyromegaly present.  Cardiovascular: Normal rate, regular rhythm, S1 normal, S2 normal, normal heart sounds, intact distal pulses and normal pulses.  Exam reveals no gallop.   No murmur heard. Pulmonary/Chest: Effort normal and breath sounds normal. No respiratory distress. He has no wheezes. He has no rhonchi. He has no rales.  Abdominal: Soft. Normal appearance and bowel sounds are normal. There is no hepatosplenomegaly. There is no tenderness. There is no rebound, no guarding and no CVA tenderness. No hernia.  Neurological: He is alert. He has normal reflexes.  Skin: Skin is warm, dry and intact. No rash noted.  Psychiatric: He has a normal mood and affect. His speech is normal and behavior is normal. Judgment normal.          Assessment & Plan:

## 2013-06-28 NOTE — Progress Notes (Signed)
Pre-visit discussion using our clinic review tool. No additional management support is needed unless otherwise documented below in the visit note.  

## 2013-06-28 NOTE — Assessment & Plan Note (Signed)
May have been the flu, now resolving. Symptomatic care.

## 2013-07-04 ENCOUNTER — Telehealth: Payer: Self-pay | Admitting: Cardiology

## 2013-07-04 NOTE — Telephone Encounter (Signed)
Patient notified of test results - negative for abdominal aneurysm, per Dr. Meda Coffee. Patient verbalized understanding and denies current questions or concerns.

## 2013-07-04 NOTE — Telephone Encounter (Signed)
New Problem:  Pt states he is returning a call to the nurse. Pt would like a call back.

## 2013-07-04 NOTE — Progress Notes (Signed)
Quick Note:  Patient notified of test results - negative for abdominal aneurysm, per Dr. Meda Coffee. Patient verbalized understanding and denies current questions or concerns. ______

## 2013-07-05 ENCOUNTER — Ambulatory Visit (INDEPENDENT_AMBULATORY_CARE_PROVIDER_SITE_OTHER): Payer: BC Managed Care – PPO | Admitting: Pulmonary Disease

## 2013-07-05 ENCOUNTER — Encounter: Payer: Self-pay | Admitting: Pulmonary Disease

## 2013-07-05 VITALS — BP 138/84 | HR 69 | Temp 97.8°F | Ht 67.0 in | Wt 259.2 lb

## 2013-07-05 DIAGNOSIS — G4733 Obstructive sleep apnea (adult) (pediatric): Secondary | ICD-10-CM

## 2013-07-05 NOTE — Assessment & Plan Note (Signed)
The patient's history is very suggestive of obstructive sleep apnea. I have had a long discussion with him about the pathophysiology of sleep apnea, including its impact to his quality of life and cardiovascular health. I think he would benefit from a sleep study for diagnosis. The patient has great difficulty sleeping away from home, yet I am concerned about doing a home sleep test because of our inability to judge total sleep time. After a long discussion, the patient would like to try home sleep testing first, and if it is nondiagnostic, he is willing to have a formal sleep study with a sedative hypnotic onboard.

## 2013-07-05 NOTE — Progress Notes (Signed)
Subjective:    Patient ID: Colton Porter, male    DOB: [birth year], 48 y.o.   MRN: [id number]  HPI The patient is a 48 year old male who I've been asked to see for possible obstructive sleep apnea. He has had a long-standing issue with fragmented sleep, a lot of which is attributable to his history of alcoholism. He has since cut way back on his alcohol intake, and continues to have significant issues. He currently has been noted to have loud snoring, as well as witnessed apneas during the night. He also describes occasional gasping episodes. He has frequent awakenings during the night, and sometimes can be prolonged.  The patient is not rested in the mornings upon arising, and does note significant inappropriate daytime sleepiness with inactivity. He will frequently go home at midday to take a nap. He denies any issues in the evenings watching television or movies, but does have some sleep pressure driving longer distances. He tells me that his weight is a 40 pounds over the last 2 years, and his Epworth score today is 7.   Sleep Questionnaire What time do you typically go to bed?( Between what hours) 10-11pm 10-11pm at 1500 on 07/05/13 by Virl Cagey, CMA How long does it take you to fall asleep? 10-2mins 10-63mins at 1500 on 07/05/13 by Virl Cagey, CMA How many times during the night do you wake up? 3 3 at 1500 on 07/05/13 by Virl Cagey, CMA What time do you get out of bed to start your day? 0700 0700 at 1500 on 07/05/13 by Virl Cagey, CMA Do you drive or operate heavy machinery in your occupation? No No at 1500 on 07/05/13 by Virl Cagey, CMA How much has your weight changed (up or down) over the past two years? (In pounds) 40 lb (18.144 kg) 40 lb (18.144 kg) at 1500 on 07/05/13 by Virl Cagey, CMA Have you ever had a sleep study before? No No at 1500 on 07/05/13 by Virl Cagey, CMA Do you currently use CPAP? No No at 1500 on 07/05/13 by Virl Cagey, CMA Do you wear oxygen at any time? No No at 1500 on 07/05/13 by Virl Cagey, CMA   Review of Systems  Constitutional: Positive for appetite change and unexpected weight change. Negative for fever.  HENT: Positive for congestion. Negative for dental problem, ear pain, nosebleeds, postnasal drip, rhinorrhea, sinus pressure, sneezing, sore throat and trouble swallowing.   Eyes: Negative for redness and itching.  Respiratory: Positive for cough and shortness of breath. Negative for chest tightness and wheezing.   Cardiovascular: Negative for palpitations and leg swelling.  Gastrointestinal: Positive for abdominal pain. Negative for nausea and vomiting.  Genitourinary: Negative for dysuria.  Musculoskeletal: Positive for joint swelling.  Skin: Negative for rash.  Neurological: Negative for headaches.  Hematological: Does not bruise/bleed easily.  Psychiatric/Behavioral: Negative for dysphoric mood. The patient is not nervous/anxious.        Objective:   Physical Exam Constitutional:  Obese male, no acute distress  HENT:  Nares patent without discharge, septal deviation to the left with crusting.   Oropharynx without exudate, palate and uvula are thick and elongated.   Eyes:  Perrla, eomi, no scleral icterus  Neck:  No JVD, no TMG  Cardiovascular:  Normal rate, regular rhythm, no rubs or gallops.  No murmurs        Intact distal pulses  Pulmonary :  Normal breath sounds, no stridor or  respiratory distress   No rales, rhonchi, or wheezing  Abdominal:  Soft, nondistended, bowel sounds present.  No tenderness noted.   Musculoskeletal:  mild lower extremity edema noted.  Lymph Nodes:  No cervical lymphadenopathy noted  Skin:  No cyanosis noted  Neurologic:  Appears sleepy, but appropriate, moves all 4 extremities without obvious deficit.         Assessment & Plan:

## 2013-07-05 NOTE — Patient Instructions (Signed)
Will schedule for home sleep testing once you return from vacation.  Just call to let us know you are back and we can arrange.  Work on weight reduction.

## 2013-07-09 ENCOUNTER — Encounter: Payer: Self-pay | Admitting: *Deleted

## 2013-07-15 ENCOUNTER — Encounter: Payer: Self-pay | Admitting: Cardiology

## 2013-07-15 ENCOUNTER — Ambulatory Visit (INDEPENDENT_AMBULATORY_CARE_PROVIDER_SITE_OTHER): Payer: BC Managed Care – PPO | Admitting: Cardiology

## 2013-07-15 VITALS — BP 132/74 | HR 74 | Ht 67.0 in | Wt 257.0 lb

## 2013-07-15 DIAGNOSIS — I10 Essential (primary) hypertension: Secondary | ICD-10-CM

## 2013-07-15 DIAGNOSIS — I5031 Acute diastolic (congestive) heart failure: Secondary | ICD-10-CM

## 2013-07-15 LAB — BASIC METABOLIC PANEL
BUN: 6 mg/dL (ref 6–23)
CO2: 27 mEq/L (ref 19–32)
Calcium: 8.9 mg/dL (ref 8.4–10.5)
Chloride: 103 mEq/L (ref 96–112)
Creatinine, Ser: 0.8 mg/dL (ref 0.4–1.5)
GFR: 108.64 mL/min (ref >60.00)
Glucose, Bld: 109 mg/dL — ABNORMAL HIGH (ref 70–99)
Potassium: 3.8 mEq/L (ref 3.5–5.1)
Sodium: 137 mEq/L (ref 135–145)

## 2013-07-15 NOTE — Progress Notes (Signed)
Patient ID: Colton Porter, male   DOB: [birth year], 48 y.o.   MRN: [id number]     Patient Name: Colton Porter Date of Encounter: 07/15/2013  Primary Care Provider:  Viviana Simpler, MD Primary Cardiologist:  Ena Dawley, M.D., [facility]  Problem List   Past Medical History  Diagnosis Date  . Allergy   . GERD (gastroesophageal reflux disease)   . Hyperlipidemia   . Hypertension   . Neuropathy    No past surgical history on file.  Allergies  Allergies  Allergen Reactions  . Hydrochlorothiazide W-Triamterene     REACTION: dizzy, nausea  . Lisinopril     REACTION: cough, decreased libido   HPI  48 year old male with history of hyperlipidemia, obesity, hypertension, significant drinking history who was recently seen by his PCP for complains of exertional SOB and chest pain. The patient describes DOE after walking 40-50 feet that is followed by chest tightness with radiation to his left arm. After he stops the symptoms resolve within 5 minutes. He denies resting chest pain.  The patient states that he also developed lower extremity edema. In the past he was prescribed Lasix with some improvement but was taken of it and he is not sure why. He has never smoked. He drinks 12-18 beers a day chronically and has known elevation of liver enzymes.  He also states that he snores and has significant sleep apnea at night. He is scheduled for a sleep study in January.  He denies any syncope.  His brother had myocardial infarction at age of 30. Multiple family members died of ruptured AAA. He has never been screened.  This is a 1 month follow up. Significant improvement in LE edema and SOB, but feels fatigued, no energy. He states that he gained a lot of weight in the last few years and has no motivation to exercise.  Home Medications  Prior to Admission medications   Medication Sig Start Date End Date Taking? Authorizing Provider  doxazosin (CARDURA) 4 MG tablet TAKE 1 TABLET (4 MG TOTAL)  BY MOUTH AT BEDTIME. 06/04/13  Yes Venia Carbon, MD  fluticasone (FLONASE) 50 MCG/ACT nasal spray Place 2 sprays into the nose as needed. 07/11/12  Yes Venia Carbon, MD  metoprolol tartrate (LOPRESSOR) 25 MG tablet TAKE ONE TABLET BY MOUTH TWICE DAILY 07/11/12  Yes Venia Carbon, MD    Family History  Family History  Problem Relation Age of Onset  . Stroke Mother   . Hypertension Father   . Heart disease Father   . Lung cancer Maternal Aunt   . Emphysema Mother   . Emphysema Father     Social History  History   Social History  . Marital Status: Married    Spouse Name: N/A    Number of Children: 2  . Years of Education: N/A   Occupational History  . [employer]     no work lately   Social History Main Topics  . Smoking status: Never Smoker   . Smokeless tobacco: Current User    Types: Chew  . Alcohol Use: Yes     Comment: 6-10drinks daily  . Drug Use: No  . Sexual Activity: Not on file   Other Topics Concern  . Not on file   Social History Narrative   Dips   Heavy alcohol     Review of Systems, as per HPI, otherwise negative General:  No chills, fever, night sweats or weight changes.  Cardiovascular:  No chest  pain, dyspnea on exertion, edema, orthopnea, palpitations, paroxysmal nocturnal dyspnea. Dermatological: No rash, lesions/masses Respiratory: No cough, dyspnea Urologic: No hematuria, dysuria Abdominal:   No nausea, vomiting, diarrhea, bright red blood per rectum, melena, or hematemesis Neurologic:  No visual changes, wkns, changes in mental status. All other systems reviewed and are otherwise negative except as noted above.  Physical Exam  Blood pressure 132/74, pulse 74, height 5\' 7"  (1.702 m), weight 257 lb (116.574 kg).  General: Pleasant, NAD, obese Psych: Normal affect. Neuro: Alert and oriented X 3. Moves all extremities spontaneously. HEENT: Normal  Neck: Supple without bruits or JVD. Lungs:  Resp regular and unlabored,  wheezes Heart: RRR no s3, s4, or murmurs. Abdomen: Soft, non-tender, non-distended, BS + x 4.  Extremities: No clubbing, cyanosis, non-pitting edema at the ankles. DP/PT/Radials 2+ and equal bilaterally.  Labs:  No results found for this basename: CKTOTAL, CKMB, TROPONINI,  in the last 72 hours Lab Results  Component Value Date   WBC 3.6* 06/04/2013   HGB 14.2 06/04/2013   HCT 40.8 06/04/2013   MCV 94.9 06/04/2013   PLT 55.0* 06/04/2013   No results found for this basename: NA, K, CL, CO2, BUN, CREATININE, CALCIUM, LABALBU, PROT, BILITOT, ALKPHOS, ALT, AST, GLUCOSE,  in the last 168 hours Lab Results  Component Value Date   CHOL 206* 11/16/2011   HDL 47.30 11/16/2011   LDLCALC 123* 05/27/2009   TRIG 242.0* 11/16/2011   Accessory Clinical Findings  echocardiogram  ECG - normal sinus rhythm, 83 beats per minute, left posterior fascicular block, T-wave abnormalities with possible inferior ischemia. Abnormal EKG.  Left and right cardiac cath: 06/10/13 Final Conclusions:  1. Normal LV function with elevated LVEDP  2. Normal coronary arteries  3. Elevated right heart pressures suspect secondary to diastolic dysfunction (transpulmonic gradient less than 10 mm Hg)  Recommendations: lifestyle modification, medical therapy for CHF  Sherren Mocha  06/10/2013, 2:19 PM   Assessment & Plan  A 48 year old male with  1. Exertional chest pain - abnormal ECG suggesting possible inferior ischemia, multiple risk factors, including obesity, HLP, HTN. He was considered a hight risk and he underwent a cath that was normal. LVEF normal, but elevated LVEDP.  2. Chronic diastolic CHF - NYHA II-III, non-ischemic, preserved LVEF, elevated LVEDP.  Improvement in LE edema and SOB with Lasix. Now euvolemic. We will continue Lasix 40 mg po daily, check CMP today. Low K at the last visit.  3. Hypertension - controlled after increasing Metoprolol to 50 mg po bid, intolerant to ACEI,   4. Hyperlipidemia -  high TAG, possibly secondary to etoh abuse, elevated AST, we will consider Zetia, omega 3 acids   5. OSA - a sleep study scheduled for January 2015  6. Etoh abuse - counselled  7. Obesity - we will refer him to a cardiac rehab today.  72. Family h/o ruptured AAA in multiple family members - [name] negative  9. Possible TOS - upper extremity numbness when elevated, we will order UE arterial US at the next visit   Follow up in 3 months  Dorothy Spark, MD, [facility] 07/15/2013, 8:34 AM

## 2013-07-15 NOTE — Addendum Note (Signed)
Addended by: Nyoka Lint on: 07/15/2013 09:12 AM   Modules accepted: Orders

## 2013-07-15 NOTE — Patient Instructions (Signed)
Lab work today  Paramedic )    Your physician wants you to follow-up in: 3 months You will receive a reminder letter in the mail two months in advance. If you don't receive a letter, please call our office to schedule the follow-up appointment.    Cardiac Rehab will call you

## 2013-07-15 NOTE — Addendum Note (Signed)
Addended by: Karalyne Nusser on: 07/15/2013 09:12 AM   Modules accepted: Orders  

## 2013-07-22 ENCOUNTER — Encounter: Payer: Self-pay | Admitting: Gastroenterology

## 2013-08-16 ENCOUNTER — Ambulatory Visit (INDEPENDENT_AMBULATORY_CARE_PROVIDER_SITE_OTHER): Payer: BC Managed Care – PPO | Admitting: Gastroenterology

## 2013-08-16 ENCOUNTER — Encounter: Payer: Self-pay | Admitting: Gastroenterology

## 2013-08-16 VITALS — BP 132/80 | HR 80 | Ht 67.0 in | Wt 263.5 lb

## 2013-08-16 DIAGNOSIS — K625 Hemorrhage of anus and rectum: Secondary | ICD-10-CM

## 2013-08-16 MED ORDER — MOVIPREP 100 G PO SOLR: 1.0000 | Freq: Once | ORAL | Status: DC

## 2013-08-16 NOTE — Progress Notes (Signed)
HPI: This is a   very pleasant 48 year old man whom I am meeting for the first time today.  Sent here by his wife for rectal bleeding.  Every time he wipes, red blood.  Feels little 'tear' at his bottom. Has anal discomfort when sitting.  Has been going on for a year.  No trouble with constipation or diarrhea.  Has 2-3 BMs per day.  Never had colonoscopy.  No colon cancer in his family that he knows.  Review of other labs; imaging:  Drinks about 12 beers per day; doesn't consider himself an alcoholic.  He is not interested in discussing potential liver disease. I offered but he prefers to focus on his chief complaint which is rectal bleeding and anal discomfort.  7/13 [name] splenomegaly, hepatomegaly 2014 LFTs several draws; AST elevated, ALT normal, Alb slightly low, INR 1.4 2014 platelets low (50-70 range)   Review of systems: Pertinent positive and negative review of systems were noted in the above HPI section. Complete review of systems was performed and was otherwise normal.    Past Medical History  Diagnosis Date  . Allergy   . GERD (gastroesophageal reflux disease)   . Hyperlipidemia   . Hypertension   . Neuropathy     Past Surgical History  Procedure Laterality Date  . Hand surgery  1997    Current Outpatient Prescriptions  Medication Sig Dispense Refill  . benzonatate (TESSALON) 200 MG capsule Take 1 capsule (200 mg total) by mouth 3 (three) times daily as needed for cough.  30 capsule  0  . doxazosin (CARDURA) 4 MG tablet Take 4 mg by mouth at bedtime.      . fluticasone (FLONASE) 50 MCG/ACT nasal spray Place 2 sprays into the nose as needed.      Marland Kitchen guaiFENesin-codeine (ROBITUSSIN AC) 100-10 MG/5ML syrup Take 5-10 mLs by mouth at bedtime as needed for cough.  180 mL  0  . ibuprofen (ADVIL,MOTRIN) 200 MG tablet Take 400 mg by mouth every 6 (six) hours as needed for moderate pain.      . metoprolol (LOPRESSOR) 50 MG tablet Take 1 tablet (50 mg total) by mouth 2  (two) times daily.  60 tablet  6  . potassium chloride (K-DUR) 10 MEQ tablet Take 1 tablet (10 mEq total) by mouth daily.  30 tablet  6  . furosemide (LASIX) 40 MG tablet Take 1 tablet (40 mg total) by mouth daily.  30 tablet  6   No current facility-administered medications for this visit.    Allergies as of 08/16/2013 - Review Complete 08/16/2013  Allergen Reaction Noted  . Hydrochlorothiazide w-triamterene  08/07/2006  . Lisinopril  08/07/2006    Family History  Problem Relation Age of Onset  . Stroke Mother   . Hypertension Father   . Heart disease Father   . Lung cancer Maternal Aunt   . Emphysema Mother   . Emphysema Father     History   Social History  . Marital Status: Married    Spouse Name: N/A    Number of Children: 2  . Years of Education: N/A   Occupational History  . [employer]     no work lately   Social History Main Topics  . Smoking status: Never Smoker   . Smokeless tobacco: Current User    Types: Chew  . Alcohol Use: Yes     Comment: 6-10drinks daily  . Drug Use: No  . Sexual Activity: Not on file  Other Topics Concern  . Not on file   Social History Narrative   Dips   Heavy alcohol       Physical Exam: BP 132/80  Pulse 80  Ht 5\' 7"  (1.702 m)  Wt 263 lb 8 oz (119.523 kg)  BMI 41.26 kg/m2 Constitutional: generally well-appearing Psychiatric: alert and oriented x3 Eyes: extraocular movements intact Mouth: oral pharynx moist, no lesions Neck: supple no lymphadenopathy Cardiovascular: heart regular rate and rhythm Lungs: clear to auscultation bilaterally Abdomen: soft, nontender, nondistended, no obvious ascites, no peritoneal signs, normal bowel sounds Extremities: no lower extremity edema bilaterally Skin: no lesions on visible extremities  rectal examination: Small external anal hemorrhoids, internal examination palpated no masses, no clear fissure disease, no obvious rectal masses, stool is brown and not checked for  Hemoccult   Assessment and plan: 48 y.o. male with  intermittent rectal bleeding  He is not anemic based on lab tests within the past month or 2. I suspect his intermittent rectal bleeding is hemorrhoidal but I did recommend that we proceed with colonoscopy at his soonest convenience to exclude other causes such as neoplasm, inflammation. He drinks about 12 beers per day and I explained to him that most would consider that he is an alcoholic. He was not interested in discussing potential liver disease with me. I did explain to him that I think he drinks too much and that it is damaging his liver based on my review of his liver tests and that he may already have cirrhosis. I offered my professional systems on this but he declined. He really wants to just focus on the rectal bleeding. We will therefore set him up for colonoscopy.

## 2013-08-16 NOTE — Patient Instructions (Addendum)
You probably drink too much alcohol and your liver is most likely damaged because of this.  You need to cut back significantly or stop completely.  There is a very good chance that you will develop cirrhosis of the liver if you haven't already. If you are interested in any testing of your liver, please let Dr. Ardis Hughs know. You will be set up for a colonoscopy for rectal bleeding.

## 2013-08-19 ENCOUNTER — Encounter: Payer: Self-pay | Admitting: Gastroenterology

## 2013-08-19 DIAGNOSIS — G4733 Obstructive sleep apnea (adult) (pediatric): Secondary | ICD-10-CM

## 2013-08-21 ENCOUNTER — Encounter: Payer: Self-pay | Admitting: Pulmonary Disease

## 2013-08-21 ENCOUNTER — Telehealth: Payer: Self-pay | Admitting: Pulmonary Disease

## 2013-08-21 DIAGNOSIS — G4733 Obstructive sleep apnea (adult) (pediatric): Secondary | ICD-10-CM

## 2013-08-21 NOTE — Telephone Encounter (Signed)
Pt needs ov to review his recent sleep study 

## 2013-08-21 NOTE — Telephone Encounter (Signed)
Scheduled for appt with [facility] 08/23/13 at 1130

## 2013-08-23 ENCOUNTER — Ambulatory Visit (INDEPENDENT_AMBULATORY_CARE_PROVIDER_SITE_OTHER): Payer: BC Managed Care – PPO | Admitting: Pulmonary Disease

## 2013-08-23 ENCOUNTER — Encounter: Payer: Self-pay | Admitting: Pulmonary Disease

## 2013-08-23 VITALS — BP 130/78 | HR 79 | Temp 98.1°F | Ht 67.0 in | Wt 267.0 lb

## 2013-08-23 DIAGNOSIS — G4733 Obstructive sleep apnea (adult) (pediatric): Secondary | ICD-10-CM

## 2013-08-23 NOTE — Progress Notes (Signed)
   Subjective:    Patient ID: Colton Porter, male    DOB: 17-Jan-1966, 48 y.o.   MRN: [id number]  HPI The patient comes in today for followup of his recent home sleep test. He was found to have an AHI of 39 events per hour with oxygen desaturation as low as 78%. I have reviewed the study with him in detail, and answered all of his questions.   Review of Systems  Constitutional: Negative for fever and unexpected weight change.  HENT: Negative for congestion, dental problem, ear pain, nosebleeds, postnasal drip, rhinorrhea, sinus pressure, sneezing, sore throat and trouble swallowing.   Eyes: Negative for redness and itching.  Respiratory: Negative for cough, chest tightness, shortness of breath and wheezing.   Cardiovascular: Negative for palpitations and leg swelling.  Gastrointestinal: Negative for nausea and vomiting.  Genitourinary: Negative for dysuria.  Musculoskeletal: Negative for joint swelling.  Skin: Negative for rash.  Neurological: Negative for headaches.  Hematological: Does not bruise/bleed easily.  Psychiatric/Behavioral: Negative for dysphoric mood. The patient is not nervous/anxious.        Objective:   Physical Exam Obese male in no acute distress Nose without purulence or discharge noted Neck without lymphadenopathy or thyromegaly Lower extremities with mild edema, no cyanosis Alert and oriented, moves all 4 extremities.       Assessment & Plan:

## 2013-08-23 NOTE — Patient Instructions (Signed)
Will start on cpap with moderate pressure level.  Please call if having issues with tolerance. Work on weight loss followup with me in 8 weeks.

## 2013-08-23 NOTE — Assessment & Plan Note (Signed)
The patient has severe obstructive sleep apnea by his recent sleep study, and I have recommended a trial of CPAP while he is working on weight loss. The patient is agreeable to this approach. I will set the patient up on cpap at a moderate pressure level to allow for desensitization, and will troubleshoot the device over the next 4-6weeks if needed.  The pt is to call me if having issues with tolerance.  Will then optimize the pressure once patient is able to wear cpap on a consistent basis.

## 2013-08-25 HISTORY — PX: COLONOSCOPY: SHX174

## 2013-09-02 ENCOUNTER — Telehealth: Payer: Self-pay | Admitting: Pulmonary Disease

## 2013-09-02 NOTE — Telephone Encounter (Signed)
From Melissa: Tried to call you back but the Triage line just keeps ringing, probably because it's lunchtime. We have this pt's order and will call him today. Thanks    lmtcb x1

## 2013-09-02 NOTE — Telephone Encounter (Signed)
Order was sent [facility] 08/23/13.  i called melissa and she will call us back after she looks into this.

## 2013-09-02 NOTE — Telephone Encounter (Signed)
Pt returned call.  Colton Porter ° °

## 2013-09-02 NOTE — Telephone Encounter (Signed)
I called and made pt aware. He reports they did call him. Nothing further needed

## 2013-09-03 ENCOUNTER — Encounter: Payer: Self-pay | Admitting: Gastroenterology

## 2013-09-03 ENCOUNTER — Ambulatory Visit (AMBULATORY_SURGERY_CENTER): Payer: BC Managed Care – PPO | Admitting: Gastroenterology

## 2013-09-03 VITALS — BP 128/74 | HR 64 | Temp 97.3°F | Resp 19 | Ht 67.0 in | Wt 263.0 lb

## 2013-09-03 DIAGNOSIS — K644 Residual hemorrhoidal skin tags: Secondary | ICD-10-CM

## 2013-09-03 DIAGNOSIS — K625 Hemorrhage of anus and rectum: Secondary | ICD-10-CM

## 2013-09-03 DIAGNOSIS — D126 Benign neoplasm of colon, unspecified: Secondary | ICD-10-CM

## 2013-09-03 MED ORDER — SODIUM CHLORIDE 0.9 % IV SOLN: 500.0000 mL | INTRAVENOUS | Status: DC

## 2013-09-03 NOTE — Progress Notes (Signed)
A/ox3 pleased with MAC, report to Karen RN 

## 2013-09-03 NOTE — Patient Instructions (Signed)
YOU HAD AN ENDOSCOPIC PROCEDURE TODAY AT THE [facility]: Refer to the procedure report that was given to you for any specific questions about what was found during the examination.  If the procedure report does not answer your questions, please call your gastroenterologist to clarify.  If you requested that your care partner not be given the details of your procedure findings, then the procedure report has been included in a sealed envelope for you to review at your convenience later.  YOU SHOULD EXPECT: Some feelings of bloating in the abdomen. Passage of more gas than usual.  Walking can help get rid of the air that was put into your GI tract during the procedure and reduce the bloating. If you had a lower endoscopy (such as a colonoscopy or flexible sigmoidoscopy) you may notice spotting of blood in your stool or on the toilet paper. If you underwent a bowel prep for your procedure, then you may not have a normal bowel movement for a few days.  DIET: Your first meal following the procedure should be a light meal and then it is ok to progress to your normal diet.  A half-sandwich or bowl of soup is an example of a good first meal.  Heavy or fried foods are harder to digest and may make you feel nauseous or bloated.  Likewise meals heavy in dairy and vegetables can cause extra gas to form and this can also increase the bloating.  Drink plenty of fluids but you should avoid alcoholic beverages for 24 hours.  ACTIVITY: Your care partner should take you home directly after the procedure.  You should plan to take it easy, moving slowly for the rest of the day.  You can resume normal activity the day after the procedure however you should NOT DRIVE or use heavy machinery for 24 hours (because of the sedation medicines used during the test).    SYMPTOMS TO REPORT IMMEDIATELY: A gastroenterologist can be reached at any hour.  During normal business hours, 8:30 AM to 5:00 PM Monday through Friday,  call [phone].  After hours and on weekends, please call the GI answering service at [phone] who will take a message and have the physician on call contact you.   Following lower endoscopy (colonoscopy or flexible sigmoidoscopy):  Excessive amounts of blood in the stool  Significant tenderness or worsening of abdominal pains  Swelling of the abdomen that is new, acute  Fever of 100F or higher    FOLLOW UP: If any biopsies were taken you will be contacted by phone or by letter within the next 1-3 weeks.  Call your gastroenterologist if you have not heard about the biopsies in 3 weeks.  Our staff will call the home number listed on your records the next business day following your procedure to check on you and address any questions or concerns that you may have at that time regarding the information given to you following your procedure. This is a courtesy call and so if there is no answer at the home number and we have not heard from you through the emergency physician on call, we will assume that you have returned to your regular daily activities without incident.  SIGNATURES/CONFIDENTIALITY: You and/or your care partner have signed paperwork which will be entered into your electronic medical record.  These signatures attest to the fact that that the information above on your After Visit Summary has been reviewed and is understood.  Full responsibility of the confidentiality   of this discharge information lies with you and/or your care-partner.     

## 2013-09-03 NOTE — Op Note (Signed)
[place]  [address] [place], [address]   COLONOSCOPY PROCEDURE REPORT  PATIENT: Colton Porter, Colton Porter  MR#: [id number] BIRTHDATE: 12/19/1965 , 64  yrs. old GENDER: Male ENDOSCOPIST: Milus Banister, MD REFERRED AY:[id number] Silvio Pate, M.D. PROCEDURE DATE:  09/03/2013 PROCEDURE:   Colonoscopy with snare polypectomy First Screening Colonoscopy - Avg.  risk and is 50 yrs.  old or older - No.  Prior Negative Screening - Now for repeat screening. N/A  History of Adenoma - Now for follow-up colonoscopy & has been > or = to 3 yrs.  N/A  Polyps Removed Today? Yes. ASA CLASS:   Class II INDICATIONS:minor intermittent rectal bleeding. MEDICATIONS: MAC sedation, administered by CRNA and Propofol (Diprivan) 320 mg IV  DESCRIPTION OF PROCEDURE:   After the risks benefits and alternatives of the procedure were thoroughly explained, informed consent was obtained.  A digital rectal exam revealed no abnormalities of the rectum.   The [id number]  endoscope was introduced through the anus and advanced to the cecum, which was identified by both the appendix and ileocecal valve. No adverse events experienced.   The quality of the prep was excellent.  The instrument was then slowly withdrawn as the colon was fully examined.  COLON FINDINGS: Three polyps were found, removed and sent to pathology.  These were all sessile, 3-96mm across, located in cecum, sigmoid and rectum, removed with cold snare.  There were internal and external hemorrhoids.  The examination was otherwise normal. Retroflexed views revealed no abnormalities. The time to cecum=5 minutes 36 seconds.  Withdrawal time=9 minutes 50 seconds.  The scope was withdrawn and the procedure completed. COMPLICATIONS: There were no complications.  ENDOSCOPIC IMPRESSION: Three polyps were found, removed and sent to pathology. There were small internal and external hemorrhoids.  These are the likely source of your minor  intermittent rectal bleeding. The examination was otherwise normal.  RECOMMENDATIONS: If the polyp(s) removed today are proven to be adenomatous (pre-cancerous) polyps, you will need a repeat colonoscopy in 3-5 years.  Otherwise you should continue to follow colorectal cancer screening guidelines for "routine risk" patients with colonoscopy in 10 years.  You will receive a letter within 1-2 weeks with the results of your biopsy as well as final recommendations.  Please call my office if you have not received a letter after 3 weeks.   eSigned:  Milus Banister, MD 09/03/2013 10:17 AM

## 2013-09-03 NOTE — Progress Notes (Signed)
Called to room to assist during endoscopic procedure.  Patient ID and intended procedure confirmed with present staff. Received instructions for my participation in the procedure from the performing physician.  

## 2013-09-04 ENCOUNTER — Telehealth: Payer: Self-pay

## 2013-09-04 NOTE — Telephone Encounter (Signed)
  Follow up Call-  Call back number 09/03/2013  Post procedure Call Back phone  # [phone]  Permission to leave phone message Yes     Patient questions:  Do you have a fever, pain , or abdominal swelling? no Pain Score  0 *  Have you tolerated food without any problems? yes  Have you been able to return to your normal activities? yes  Do you have any questions about your discharge instructions: Diet   no Medications  no Follow up visit  no  Do you have questions or concerns about your Care? no  Actions: * If pain score is 4 or above: No action needed, pain <4.

## 2013-09-11 ENCOUNTER — Encounter: Payer: Self-pay | Admitting: Gastroenterology

## 2013-09-14 ENCOUNTER — Other Ambulatory Visit: Payer: Self-pay | Admitting: Internal Medicine

## 2013-10-14 ENCOUNTER — Other Ambulatory Visit: Payer: Self-pay | Admitting: Internal Medicine

## 2013-10-15 ENCOUNTER — Other Ambulatory Visit: Payer: Self-pay | Admitting: *Deleted

## 2013-10-17 ENCOUNTER — Ambulatory Visit (INDEPENDENT_AMBULATORY_CARE_PROVIDER_SITE_OTHER): Payer: BC Managed Care – PPO | Admitting: Cardiology

## 2013-10-17 ENCOUNTER — Other Ambulatory Visit: Payer: Self-pay | Admitting: Internal Medicine

## 2013-10-17 ENCOUNTER — Encounter: Payer: Self-pay | Admitting: Cardiology

## 2013-10-17 VITALS — BP 142/79 | HR 72 | Ht 67.0 in | Wt 262.0 lb

## 2013-10-17 DIAGNOSIS — E785 Hyperlipidemia, unspecified: Secondary | ICD-10-CM

## 2013-10-17 DIAGNOSIS — R609 Edema, unspecified: Secondary | ICD-10-CM

## 2013-10-17 DIAGNOSIS — I10 Essential (primary) hypertension: Secondary | ICD-10-CM

## 2013-10-17 DIAGNOSIS — I5031 Acute diastolic (congestive) heart failure: Secondary | ICD-10-CM

## 2013-10-17 LAB — HEMOGLOBIN A1C: Hgb A1c MFr Bld: 5.5 % (ref 4.6–6.5)

## 2013-10-17 MED ORDER — AMLODIPINE BESYLATE 5 MG PO TABS: 5.0000 mg | ORAL_TABLET | Freq: Every day | ORAL | Status: DC

## 2013-10-17 MED ORDER — PREGABALIN 50 MG PO CAPS: 50.0000 mg | ORAL_CAPSULE | Freq: Two times a day (BID) | ORAL | Status: DC

## 2013-10-17 NOTE — Progress Notes (Signed)
Patient ID: JI FELDNER, male   DOB: 1966/02/21, 48 y.o.   MRN: [id number]    Patient Name: Colton Porter Date of Encounter: 10/17/2013  Primary Care Provider:  Viviana Simpler, MD Primary Cardiologist:  Ena Dawley, M.D., [facility]  Problem List   Past Medical History  Diagnosis Date  . Allergy   . GERD (gastroesophageal reflux disease)   . Hyperlipidemia   . Hypertension   . Neuropathy    Past Surgical History  Procedure Laterality Date  . Hand surgery  1997    Allergies  Allergies  Allergen Reactions  . Hydrochlorothiazide W-Triamterene     REACTION: dizzy, nausea  . Lisinopril     REACTION: cough, decreased libido   HPI  48 year old male with history of hyperlipidemia, obesity, hypertension, significant drinking history who was recently seen by his PCP for complains of exertional SOB and chest pain. The patient describes DOE after walking 40-50 feet that is followed by chest tightness with radiation to his left arm. After he stops the symptoms resolve within 5 minutes. He denies resting chest pain.  The patient states that he also developed lower extremity edema. In the past he was prescribed Lasix with some improvement but was taken of it and he is not sure why. He has never smoked. He drinks 12-18 beers a day chronically and has known elevation of liver enzymes.  He also states that he snores and has significant sleep apnea at night. He is scheduled for a sleep study in January.  He denies any syncope.  His brother had myocardial infarction at age of 65. Multiple family members died of ruptured AAA. He has never been screened.  07/15/2013 - Significant improvement in LE edema and SOB, but feels fatigued, no energy. He states that he gained a lot of weight in the last few years and has no motivation to exercise.  10/17/2013 - LE edema stays the same, but he feels significant fatigue. SOB on and off, no chest pain. Complains of paresthesias.  Home  Medications  Prior to Admission medications   Medication Sig Start Date End Date Taking? Authorizing Provider  doxazosin (CARDURA) 4 MG tablet TAKE 1 TABLET (4 MG TOTAL) BY MOUTH AT BEDTIME. 06/04/13  Yes Venia Carbon, MD  fluticasone (FLONASE) 50 MCG/ACT nasal spray Place 2 sprays into the nose as needed. 07/11/12  Yes Venia Carbon, MD  metoprolol tartrate (LOPRESSOR) 25 MG tablet TAKE ONE TABLET BY MOUTH TWICE DAILY 07/11/12  Yes Venia Carbon, MD    Family History  Family History  Problem Relation Age of Onset  . Stroke Mother   . Hypertension Father   . Heart disease Father   . Lung cancer Maternal Aunt   . Emphysema Mother   . Emphysema Father     Social History  History   Social History  . Marital Status: Married    Spouse Name: N/A    Number of Children: 2  . Years of Education: N/A   Occupational History  . [employer]     no work lately   Social History Main Topics  . Smoking status: Never Smoker   . Smokeless tobacco: Current User    Types: Chew  . Alcohol Use: Yes     Comment: 6-10drinks daily  . Drug Use: No  . Sexual Activity: Not on file   Other Topics Concern  . Not on file   Social History Narrative   Dips   Heavy alcohol  Review of Systems, as per HPI, otherwise negative General:  No chills, fever, night sweats or weight changes.  Cardiovascular:  No chest pain, dyspnea on exertion, edema, orthopnea, palpitations, paroxysmal nocturnal dyspnea. Dermatological: No rash, lesions/masses Respiratory: No cough, dyspnea Urologic: No hematuria, dysuria Abdominal:   No nausea, vomiting, diarrhea, bright red blood per rectum, melena, or hematemesis Neurologic:  No visual changes, wkns, changes in mental status. All other systems reviewed and are otherwise negative except as noted above.  Physical Exam  Blood pressure 142/79, pulse 72, height 5\' 7"  (1.702 m), weight 262 lb (118.842 kg).  General: Pleasant, NAD, obese Psych:  Normal affect. Neuro: Alert and oriented X 3. Moves all extremities spontaneously. HEENT: Normal  Neck: Supple without bruits or JVD. Lungs:  Resp regular and unlabored, wheezes Heart: RRR no s3, s4, or murmurs. Abdomen: Soft, non-tender, non-distended, BS + x 4.  Extremities: No clubbing, cyanosis, non-pitting edema at the ankles. DP/PT/Radials 2+ and equal bilaterally.  Labs:  No results found for this basename: CKTOTAL, CKMB, TROPONINI,  in the last 72 hours Lab Results  Component Value Date   WBC 3.6* 06/04/2013   HGB 14.2 06/04/2013   HCT 40.8 06/04/2013   MCV 94.9 06/04/2013   PLT 55.0* 06/04/2013   No results found for this basename: NA, K, CL, CO2, BUN, CREATININE, CALCIUM, LABALBU, PROT, BILITOT, ALKPHOS, ALT, AST, GLUCOSE,  in the last 168 hours Lab Results  Component Value Date   CHOL 206* 11/16/2011   HDL 47.30 11/16/2011   LDLCALC 123* 05/27/2009   TRIG 242.0* 11/16/2011   Accessory Clinical Findings  echocardiogram  ECG - normal sinus rhythm, 83 beats per minute, left posterior fascicular block, T-wave abnormalities with possible inferior ischemia. Abnormal EKG.  Left and right cardiac cath: 06/10/13 Final Conclusions:  1. Normal LV function with elevated LVEDP  2. Normal coronary arteries  3. Elevated right heart pressures suspect secondary to diastolic dysfunction (transpulmonic gradient less than 10 mm Hg)  Recommendations: lifestyle modification, medical therapy for CHF  Sherren Mocha  06/10/2013, 2:19 PM   Assessment & Plan  A 48 year old male with  1. Exertional chest pain - abnormal ECG suggesting possible inferior ischemia, multiple risk factors, including obesity, HLP, HTN. He was considered a hight risk and he underwent a cath that was normal. LVEF normal, but elevated LVEDP.  2. Chronic diastolic CHF - NYHA II-III, non-ischemic, preserved LVEF, elevated LVEDP.  Improvement in LE edema and SOB with Lasix. Now euvolemic. We will continue Lasix 40 mg po  daily, stop metoprolol, start amlodipine 5 mg PO QD.   3. Hypertension - discontinue Metoprolol to 50 mg po bid (fatigue), start amlodipine 5 mg po daily, intolerant to ACEI,   4. Hyperlipidemia - high TAG, possibly secondary to etoh abuse, elevated AST, we will consider Zetia, omega 3 acids   5. OSA - started on CPAP in January 2015  6. Etoh abuse - counselled  7. Obesity - we will refer him to a cardiac rehab today.  37. Family h/o ruptured AAA in multiple family members - [name] negative  9. Possible TOS - upper extremity numbness when elevated, we will order UE arterial US at the next visit   10. Neuropathic pain - IGT - we will check HbA1c, prescribe Lyrica 50 mg po BID  Follow up in 2 months  Dorothy Spark, MD, [facility] 10/17/2013, 8:44 AM

## 2013-10-17 NOTE — Patient Instructions (Signed)
STOP TAKING METOPROLOL  START TAKING AMLODIPINE 5 MG DAILY  START TAKING LYRICA 50 MG 2 TIMES DAILY  Your physician recommends that you schedule a follow-up appointment in: [place]  Your physician recommends that you return for lab work in: TODAY (HEMOGLOBIN A1C)

## 2013-10-18 ENCOUNTER — Telehealth: Payer: Self-pay | Admitting: Internal Medicine

## 2013-10-18 ENCOUNTER — Encounter: Payer: Self-pay | Admitting: Pulmonary Disease

## 2013-10-18 ENCOUNTER — Ambulatory Visit (INDEPENDENT_AMBULATORY_CARE_PROVIDER_SITE_OTHER): Payer: BC Managed Care – PPO | Admitting: Pulmonary Disease

## 2013-10-18 VITALS — BP 140/80 | HR 73 | Temp 98.0°F | Ht 67.0 in | Wt 263.0 lb

## 2013-10-18 DIAGNOSIS — G4733 Obstructive sleep apnea (adult) (pediatric): Secondary | ICD-10-CM

## 2013-10-18 NOTE — Progress Notes (Signed)
   Subjective:    Patient ID: Colton Porter, male    DOB: 06-18-1966, 48 y.o.   MRN: [id number]  HPI The patient comes in today for followup of his known obstructive sleep apnea. He is wearing CPAP compliantly, but has been pulling the mask off during the night on some nights. He has issues with leaking on some nights, but it does well with others. His download shows excellent compliance, but he is having some breakthrough events. He has clearly seen a difference on the days that he wears the machine all night and day versus not. He feels this greatly helps his alertness.     Review of Systems  Constitutional: Negative for fever and unexpected weight change.  HENT: Negative for congestion, dental problem, ear pain, nosebleeds, postnasal drip, rhinorrhea, sinus pressure, sneezing, sore throat and trouble swallowing.   Eyes: Negative for redness and itching.  Respiratory: Negative for cough, chest tightness, shortness of breath and wheezing.   Cardiovascular: Negative for palpitations and leg swelling.  Gastrointestinal: Negative for nausea and vomiting.  Genitourinary: Negative for dysuria.  Musculoskeletal: Negative for joint swelling.  Skin: Negative for rash.  Neurological: Negative for headaches.  Hematological: Does not bruise/bleed easily.  Psychiatric/Behavioral: Negative for dysphoric mood. The patient is not nervous/anxious.        Objective:   Physical Exam Overweight male in no acute distress Nose without purulence or discharge noted No skin breakdown or pressure necrosis from the CPAP Neck without lymphadenopathy or thyromegaly Lower extremities without edema, no cyanosis Alert and oriented, moves all 4 extremities.       Assessment & Plan:

## 2013-10-18 NOTE — Patient Instructions (Signed)
Will have your machine put on auto setting.  Let us know if you do not do well with this. Keep working with current mask, but if you continue to have issues, can send you to the sleep center for a mask fitting. Work on weight loss followup with me again in 74mos.

## 2013-10-18 NOTE — Assessment & Plan Note (Signed)
The patient is doing well overall with CPAP by his download, but he is having issues with his mask fit at times and also pulling the mask off during the night. I suspect part of this is related to the mask fit, and part due to breakthrough events. I will have his CPAP machine changed over to the automatic setting, and I've asked him to call us if he continues to have issues with mask leaking so that we can arrange for a fitting at the sleep Center. Finally, I have encouraged him to work aggressively on weight loss.

## 2013-10-18 NOTE — Telephone Encounter (Signed)
Relevant patient education assigned to patient using Emmi. ° °

## 2013-10-21 ENCOUNTER — Telehealth (HOSPITAL_COMMUNITY): Payer: Self-pay | Admitting: Cardiac Rehabilitation

## 2013-10-21 ENCOUNTER — Telehealth: Payer: Self-pay | Admitting: Cardiology

## 2013-10-21 NOTE — Telephone Encounter (Signed)
Sister wants to be called back ASAP! He is to high strung and risk to just be monitoring his BP and HR. She said it is still high. Please advise.

## 2013-10-21 NOTE — Telephone Encounter (Signed)
Spoke with sister about Dr Thera Flake continued recommendations for pt to continue drinking plenty of fluids, stop drinking alcohol, log BP and HR for 1 week and call nurse with results. Also advised per Dr. Meda Coffee to avoid caffeine. Dr Meda Coffee did ok for pt to take Metoprolol 25 mg BID, even though pt as reported it makes him fatigued. Recommended per Dr Meda Coffee that this be a last resort.   Patients Sister did mention that pt still drinks alcohol daily. Patients sister did mention that patient has anxiety.  Pt verbalized understanding and pleased with follow up.

## 2013-10-21 NOTE — Telephone Encounter (Signed)
Multiple phone attempts and letter mailed to patient to enroll in cardiac rehab with no response.  Dr. Meda Coffee made aware

## 2013-10-21 NOTE — Telephone Encounter (Signed)
Spoke with pt with Dr  Meda Coffee recommendation to drink plenty of fluids and avoid caffeine. Continue monitoring HR and BP. Dr. Meda Coffee advised if start experiencing SOB or chest pain to go to ER.   Pt verbalized understanding.

## 2013-10-21 NOTE — Telephone Encounter (Signed)
Message copied by Nuala Alpha on Mon Oct 21, 2013 12:07 PM ------      Message from: Dorothy Spark      Created: Fri Oct 18, 2013  6:13 PM       No evidence of diabetes, would you let him know?      Thank you,      K ------

## 2013-10-21 NOTE — Telephone Encounter (Signed)
Spoke with sister about concern and advised her that Dr Meda Coffee is aware of her concerns. Dr Meda Coffee advised that she wants no medication changes at this time. For pt to continue with current treatment regimen, continue monitoring heart rate  and BP. Also Dr Meda Coffee advises pt to drink plenty of fluids and continue holding off on drinking too much caffeine.   Sister reports pt current HR-102 BP-160/90  Will follow up with Dr Meda Coffee

## 2013-10-21 NOTE — Telephone Encounter (Signed)
Called pt to inform him of his normal Hgb A 1C results per Dr Meda Coffee.   Pt states he tried calling the office this a.m to tell say he was experiencing a slight elevation in HR.   Pt states that he took HR- 99-108. No c/o SOB, or chest pain.   Told pt I will inform Dr Meda Coffee and call him back with her recommendations.

## 2013-10-21 NOTE — Telephone Encounter (Signed)
Patients sister called in, he is having heart palpitations. He started a new med on Friday. He also has a lot of aniexty. BP is high 180/80. Please call and advise.

## 2013-10-21 NOTE — Telephone Encounter (Signed)
Message copied by Nuala Alpha on Mon Oct 21, 2013 12:20 PM ------      Message from: Dorothy Spark      Created: Fri Oct 18, 2013  6:13 PM       No evidence of diabetes, would you let him know?      Thank you,      K ------

## 2013-10-25 ENCOUNTER — Telehealth: Payer: Self-pay | Admitting: Cardiology

## 2013-10-25 MED ORDER — CARVEDILOL 12.5 MG PO TABS: 12.5000 mg | ORAL_TABLET | Freq: Two times a day (BID) | ORAL | Status: DC

## 2013-10-25 NOTE — Telephone Encounter (Signed)
Left message for patient to call back  

## 2013-10-25 NOTE — Telephone Encounter (Signed)
New order per Dr Meda Coffee for pt to start Carvedilol 12.5 mg po BID and discontinue Amlodipine.  Tried calling pt and lmtcb. Contacted Sister Lelon Frohlich who initially called and notified her of this new med. Sister Lelon Frohlich verbalized understanding and pleased with the follow up.  Med sent to pharmacy. Sister aware of this and states she will inform brother of this.

## 2013-10-25 NOTE — Telephone Encounter (Signed)
Will forward this to Dr Meda Coffee for further review

## 2013-10-25 NOTE — Telephone Encounter (Signed)
Received call from Ethelda Chick, patient's sister, who called to report that patient's HR remains around 100; states patient was instructed to call and report per Dr. Meda Coffee. Sister reports patient's BP readings:  172/76, 147/87, 160/90, 156/92 HR ranges from 94 to 107 I advised patient's sister that I will review findings with Dr. Meda Coffee, who is in the office today, and will call her back.  Ann verbalized understanding and agreement.

## 2013-10-25 NOTE — Telephone Encounter (Signed)
°  Patient's sister called in BP readings and latest update. This morning BP was 156/92 & heart rate 100. Please call and advise.

## 2013-10-25 NOTE — Telephone Encounter (Signed)
Routing message to Winifred Olive, LPN, Dr. Francesca Oman primary nurse after discussion with her to do so

## 2013-11-01 ENCOUNTER — Encounter: Payer: Self-pay | Admitting: Internal Medicine

## 2013-11-01 ENCOUNTER — Ambulatory Visit (INDEPENDENT_AMBULATORY_CARE_PROVIDER_SITE_OTHER): Payer: BC Managed Care – PPO | Admitting: Internal Medicine

## 2013-11-01 VITALS — BP 120/70 | HR 80 | Temp 98.3°F | Ht 67.0 in | Wt 259.0 lb

## 2013-11-01 DIAGNOSIS — I10 Essential (primary) hypertension: Secondary | ICD-10-CM

## 2013-11-01 DIAGNOSIS — Z0000 Encounter for general adult medical examination without abnormal findings: Secondary | ICD-10-CM

## 2013-11-01 DIAGNOSIS — E785 Hyperlipidemia, unspecified: Secondary | ICD-10-CM

## 2013-11-01 DIAGNOSIS — G589 Mononeuropathy, unspecified: Secondary | ICD-10-CM

## 2013-11-01 DIAGNOSIS — I5032 Chronic diastolic (congestive) heart failure: Secondary | ICD-10-CM

## 2013-11-01 DIAGNOSIS — I5030 Unspecified diastolic (congestive) heart failure: Secondary | ICD-10-CM | POA: Insufficient documentation

## 2013-11-01 HISTORY — DX: Chronic diastolic (congestive) heart failure: I50.32

## 2013-11-01 LAB — COMPREHENSIVE METABOLIC PANEL
ALK PHOS: 96 U/L (ref 39–117)
ALT: 29 U/L (ref 0–53)
AST: 56 U/L — ABNORMAL HIGH (ref 0–37)
Albumin: 3.2 g/dL — ABNORMAL LOW (ref 3.5–5.2)
BILIRUBIN TOTAL: 1.5 mg/dL — AB (ref 0.2–1.2)
BUN: 8 mg/dL (ref 6–23)
CO2: 27 mEq/L (ref 19–32)
Calcium: 8.4 mg/dL (ref 8.4–10.5)
Chloride: 102 mEq/L (ref 96–112)
Creat: 0.77 mg/dL (ref 0.50–1.35)
Glucose, Bld: 84 mg/dL (ref 70–99)
POTASSIUM: 3.8 meq/L (ref 3.5–5.3)
SODIUM: 138 meq/L (ref 135–145)
TOTAL PROTEIN: 7.5 g/dL (ref 6.0–8.3)

## 2013-11-01 LAB — CBC WITH DIFFERENTIAL/PLATELET
Basophils Absolute: 0 10*3/uL (ref 0.0–0.1)
Basophils Relative: 1 % (ref 0–1)
EOS ABS: 0.2 10*3/uL (ref 0.0–0.7)
EOS PCT: 4 % (ref 0–5)
HCT: 39.1 % (ref 39.0–52.0)
HEMOGLOBIN: 13.9 g/dL (ref 13.0–17.0)
LYMPHS ABS: 1 10*3/uL (ref 0.7–4.0)
Lymphocytes Relative: 24 % (ref 12–46)
MCH: 33.3 pg (ref 26.0–34.0)
MCHC: 35.5 g/dL (ref 30.0–36.0)
MCV: 93.5 fL (ref 78.0–100.0)
MONOS PCT: 13 % — AB (ref 3–12)
Monocytes Absolute: 0.5 10*3/uL (ref 0.1–1.0)
Neutro Abs: 2.4 10*3/uL (ref 1.7–7.7)
Neutrophils Relative %: 58 % (ref 43–77)
Platelets: 55 10*3/uL — ABNORMAL LOW (ref 150–400)
RBC: 4.18 MIL/uL — ABNORMAL LOW (ref 4.22–5.81)
RDW: 14 % (ref 11.5–15.5)
WBC: 4.1 10*3/uL (ref 4.0–10.5)

## 2013-11-01 LAB — LIPID PANEL
CHOL/HDL RATIO: 5.5 ratio
CHOLESTEROL: 209 mg/dL — AB (ref 0–200)
HDL: 38 mg/dL — ABNORMAL LOW (ref >39)
LDL Cholesterol: 137 mg/dL — ABNORMAL HIGH (ref 0–99)
Triglycerides: 172 mg/dL — ABNORMAL HIGH (ref <150)
VLDL: 34 mg/dL (ref 0–40)

## 2013-11-01 NOTE — Assessment & Plan Note (Signed)
BP Readings from Last 3 Encounters:  11/01/13 120/70  10/18/13 140/80  10/17/13 142/79   Good control now

## 2013-11-01 NOTE — Assessment & Plan Note (Signed)
Doing better on beta blocker Will check labs Needs to work on weight loss, etc

## 2013-11-01 NOTE — Progress Notes (Signed)
Pre visit review using our clinic review tool, if applicable. No additional management support is needed unless otherwise documented below in the visit note. 

## 2013-11-01 NOTE — Patient Instructions (Addendum)
Please call 1-800 QUIT NOW for help to stop the chewing tobacco.  Exercise to Lose Weight Exercise and a healthy diet may help you lose weight. Your doctor may suggest specific exercises. EXERCISE IDEAS AND TIPS  Choose low-cost things you enjoy doing, such as walking, bicycling, or exercising to workout videos.  Take stairs instead of the elevator.  Walk during your lunch break.  Park your car further away from work or school.  Go to a gym or an exercise class.  Start with 5 to 10 minutes of exercise each day. Build up to 30 minutes of exercise 4 to 6 days a week.  Wear shoes with good support and comfortable clothes.  Stretch before and after working out.  Work out until you breathe harder and your heart beats faster.  Drink extra water when you exercise.  Do not do so much that you hurt yourself, feel dizzy, or get very short of breath. Exercises that burn about 150 calories:  Running 1  miles in 15 minutes.  Playing volleyball for 45 to 60 minutes.  Washing and waxing a car for 45 to 60 minutes.  Playing touch football for 45 minutes.  Walking 1  miles in 35 minutes.  Pushing a stroller 1  miles in 30 minutes.  Playing basketball for 30 minutes.  Raking leaves for 30 minutes.  Bicycling 5 miles in 30 minutes.  Walking 2 miles in 30 minutes.  Dancing for 30 minutes.  Shoveling snow for 15 minutes.  Swimming laps for 20 minutes.  Walking up stairs for 15 minutes.  Bicycling 4 miles in 15 minutes.  Gardening for 30 to 45 minutes.  Jumping rope for 15 minutes.  Washing windows or floors for 45 to 60 minutes. Document Released: 07/16/2010 Document Revised: 09/05/2011 Document Reviewed: 07/16/2010 [facility] Patient Information 2014 [place], [place]. DASH Diet The DASH diet stands for "Dietary Approaches to Stop Hypertension." It is a healthy eating plan that has been shown to reduce high blood pressure (hypertension) in as little as 14 days, while  also possibly providing other significant health benefits. These other health benefits include reducing the risk of breast cancer after menopause and reducing the risk of type 2 diabetes, heart disease, colon cancer, and stroke. Health benefits also include weight loss and slowing kidney failure in patients with chronic kidney disease.  DIET GUIDELINES  Limit salt (sodium). Your diet should contain less than 1500 mg of sodium daily.  Limit refined or processed carbohydrates. Your diet should include mostly whole grains. Desserts and added sugars should be used sparingly.  Include small amounts of heart-healthy fats. These types of fats include nuts, oils, and tub margarine. Limit saturated and trans fats. These fats have been shown to be harmful in the body. CHOOSING FOODS  The following food groups are based on a 2000 calorie diet. See your Registered Dietitian for individual calorie needs. Grains and Grain Products (6 to 8 servings daily)  Eat More Often: Whole-wheat bread, brown rice, whole-grain or wheat pasta, quinoa, popcorn without added fat or salt (air popped).  Eat Less Often: White bread, white pasta, white rice, cornbread. Vegetables (4 to 5 servings daily)  Eat More Often: Fresh, frozen, and canned vegetables. Vegetables may be raw, steamed, roasted, or grilled with a minimal amount of fat.  Eat Less Often/Avoid: Creamed or fried vegetables. Vegetables in a cheese sauce. Fruit (4 to 5 servings daily)  Eat More Often: All fresh, canned (in natural juice), or frozen fruits. Dried fruits without  added sugar. One hundred percent fruit juice ( cup [237 mL] daily).  Eat Less Often: Dried fruits with added sugar. Canned fruit in light or heavy syrup. [employer], Fish, and Poultry (2 servings or less daily. One serving is 3 to 4 oz [85-114 g]).  Eat More Often: Ninety percent or leaner ground beef, tenderloin, sirloin. Round cuts of beef, chicken breast, [place] breast. All fish.  Grill, bake, or broil your meat. Nothing should be fried.  Eat Less Often/Avoid: Fatty cuts of meat, [place], or chicken leg, thigh, or wing. Fried cuts of meat or fish. Dairy (2 to 3 servings)  Eat More Often: Low-fat or fat-free milk, low-fat plain or light yogurt, reduced-fat or part-skim cheese.  Eat Less Often/Avoid: Milk (whole, 2%).Whole milk yogurt. Full-fat cheeses. Nuts, Seeds, and Legumes (4 to 5 servings per week)  Eat More Often: All without added salt.  Eat Less Often/Avoid: Salted nuts and seeds, canned beans with added salt. Fats and Sweets (limited)  Eat More Often: Vegetable oils, tub margarines without trans fats, sugar-free gelatin. Mayonnaise and salad dressings.  Eat Less Often/Avoid: Coconut oils, palm oils, butter, stick margarine, cream, half and half, cookies, candy, pie. FOR MORE INFORMATION The Dash Diet Eating Plan: www.dashdiet.org Document Released: 06/02/2011 Document Revised: 09/05/2011 Document Reviewed: 06/02/2011 [facility] Patient Information 2014 [place], [place].

## 2013-11-01 NOTE — Assessment & Plan Note (Signed)
Has cut back on dip and alcohol Discussed stopping tobacco completely Lifestyle for exercise and eating

## 2013-11-01 NOTE — Progress Notes (Signed)
Subjective:    Patient ID: Colton Porter, male    DOB: [birth year], 48 y.o.   MRN: [id number]  HPI Here for physical Didn't really understand diastolic dysfunction Has vastly cut back on his drinking--- now only 3 days per week. (6 beers per day) Discussed fitness Has cut back on chewing tobacco--discussed stopping altogether  Hard to lose weight Did have heart racing on the amlodipine---changed to carvedilol 4 days ago Seems to be doing well with this  Still has leg pain lyrica does help Intermittent swelling still-- especially with prolonged standing  Really happy with the CPAP No daytime somnolence Now used to wearing the mask  Current Outpatient Prescriptions on File Prior to Visit  Medication Sig Dispense Refill  . carvedilol (COREG) 12.5 MG tablet Take 1 tablet (12.5 mg total) by mouth 2 (two) times daily.  180 tablet  3  . doxazosin (CARDURA) 4 MG tablet TAKE 1 TABLET (4 MG TOTAL) BY MOUTH AT BEDTIME.  90 tablet  0  . fluticasone (FLONASE) 50 MCG/ACT nasal spray Place 2 sprays into the nose as needed.      . furosemide (LASIX) 40 MG tablet Take 1 tablet (40 mg total) by mouth daily.  30 tablet  6  . potassium chloride (K-DUR) 10 MEQ tablet Take 1 tablet (10 mEq total) by mouth daily.  30 tablet  6  . pregabalin (LYRICA) 50 MG capsule Take 1 capsule (50 mg total) by mouth 2 (two) times daily.  90 capsule  3   No current facility-administered medications on file prior to visit.    Allergies  Allergen Reactions  . Hydrochlorothiazide W-Triamterene     REACTION: dizzy, nausea  . Lisinopril     REACTION: cough, decreased libido    Past Medical History  Diagnosis Date  . Allergy   . GERD (gastroesophageal reflux disease)   . Hyperlipidemia   . Hypertension   . Neuropathy     Past Surgical History  Procedure Laterality Date  . Hand surgery  1997    Family History  Problem Relation Age of Onset  . Stroke Mother   . Hypertension Father   . Heart disease  Father   . Lung cancer Maternal Aunt   . Emphysema Mother   . Emphysema Father      Review of Systems  Constitutional: Negative for fatigue and unexpected weight change.       Wears seat belt  HENT: Positive for hearing loss. Negative for dental problem and tinnitus.        Overdue for dentist  Eyes: Negative for visual disturbance.       No diplopia or unilateral vision loss  Respiratory: Positive for chest tightness and shortness of breath. Negative for cough.   Cardiovascular: Positive for chest pain and leg swelling. Negative for palpitations.       Some tightness across chest--?gas  Gastrointestinal: Negative for nausea, vomiting, abdominal pain, constipation and blood in stool.       No recent heartburn Has hemorrhoids--getting evaluated for Rx  Endocrine: Positive for heat intolerance. Negative for cold intolerance.  Genitourinary: Negative for urgency, frequency and difficulty urinating.       Ongoing ED  Musculoskeletal: Positive for arthralgias. Negative for back pain and joint swelling.  Skin: Negative for rash.       No suspicious lesions--long standing dark spot on left arm though (seborrheic keratosis)  Allergic/Immunologic: Positive for environmental allergies. Negative for immunocompromised state.       Uses  OTC loratadine with success  Neurological: Positive for dizziness, numbness and headaches. Negative for syncope, weakness and light-headedness.       Some sinus headaches Positional numbness in arms  Hematological: Negative for adenopathy. Bruises/bleeds easily.  Psychiatric/Behavioral: Negative for sleep disturbance and dysphoric mood. The patient is nervous/anxious.        Feels his anxiety is better on the new meds       Objective:   Physical Exam  Constitutional: He is oriented to person, place, and time. He appears well-developed and well-nourished. No distress.  HENT:  Head: Normocephalic and atraumatic.  Right Ear: External ear normal.  Left Ear:  External ear normal.  Mouth/Throat: Oropharynx is clear and moist. No oropharyngeal exudate.  Eyes: Conjunctivae and EOM are normal. Pupils are equal, round, and reactive to light.  Neck: Normal range of motion. Neck supple. No thyromegaly present.  Cardiovascular: Normal rate, regular rhythm, normal heart sounds and intact distal pulses.  Exam reveals no gallop.   No murmur heard. Pulmonary/Chest: Effort normal and breath sounds normal. No respiratory distress. He has no wheezes. He has no rales.  Abdominal: Soft. There is no tenderness.  Musculoskeletal: He exhibits edema. He exhibits no tenderness.  2+ non pitting calf and foot edema  Lymphadenopathy:    He has no cervical adenopathy.  Neurological: He is alert and oriented to person, place, and time.  Skin: No rash noted. No erythema.  Psychiatric: He has a normal mood and affect. His behavior is normal.          Assessment & Plan:

## 2013-11-01 NOTE — Assessment & Plan Note (Signed)
Cath clear Will hold off on meds

## 2013-11-01 NOTE — Assessment & Plan Note (Signed)
Legs better on lyrica

## 2013-11-02 LAB — TSH: TSH: 0.692 u[IU]/mL (ref 0.350–4.500)

## 2013-11-02 LAB — T4, FREE: FREE T4: 0.91 ng/dL (ref 0.80–1.80)

## 2013-12-23 ENCOUNTER — Encounter: Payer: Self-pay | Admitting: Cardiology

## 2013-12-23 ENCOUNTER — Ambulatory Visit (INDEPENDENT_AMBULATORY_CARE_PROVIDER_SITE_OTHER): Payer: BC Managed Care – PPO | Admitting: Cardiology

## 2013-12-23 VITALS — BP 144/86 | HR 80 | Ht 67.0 in | Wt 257.0 lb

## 2013-12-23 DIAGNOSIS — I10 Essential (primary) hypertension: Secondary | ICD-10-CM

## 2013-12-23 DIAGNOSIS — I5032 Chronic diastolic (congestive) heart failure: Secondary | ICD-10-CM

## 2013-12-23 DIAGNOSIS — E785 Hyperlipidemia, unspecified: Secondary | ICD-10-CM

## 2013-12-23 MED ORDER — CARVEDILOL 12.5 MG PO TABS: ORAL_TABLET | ORAL | Status: DC

## 2013-12-23 NOTE — Patient Instructions (Signed)
Your physician has recommended you make the following change in your medication:   START TAKING COREG (CARVEDILOL) 12.5 MG (TAKE 1 1/2 TABLETS TWICE DAILY)  STOP TAKING LYRICA  Your physician wants you to follow-up in: [place] will receive a reminder letter in the mail two months in advance. If you don't receive a letter, please call our office to schedule the follow-up appointment.

## 2013-12-23 NOTE — Progress Notes (Signed)
Patient ID: SHJON LIZARRAGA, male   DOB: October 04, 1965, 48 y.o.   MRN: [id number]    Patient Name: Colton Porter Date of Encounter: 12/23/2013  Primary Care Provider:  Viviana Simpler, MD Primary Cardiologist:  Ena Dawley, M.D., [facility]  Problem List   Past Medical History  Diagnosis Date  . Allergy   . GERD (gastroesophageal reflux disease)   . Hyperlipidemia   . Hypertension   . Neuropathy    Past Surgical History  Procedure Laterality Date  . Hand surgery  1997   Allergies  Allergies  Allergen Reactions  . Hydrochlorothiazide W-Triamterene     REACTION: dizzy, nausea  . Lisinopril     REACTION: cough, decreased libido   HPI  48 year old male with history of hyperlipidemia, obesity, hypertension, significant drinking history who was recently seen by his PCP for complains of exertional SOB and chest pain. The patient describes DOE after walking 40-50 feet that is followed by chest tightness with radiation to his left arm. After he stops the symptoms resolve within 5 minutes. He denies resting chest pain.  The patient states that he also developed lower extremity edema. In the past he was prescribed Lasix with some improvement but was taken of it and he is not sure why. He has never smoked. He drinks 12-18 beers a day chronically and has known elevation of liver enzymes.  He also states that he snores and has significant sleep apnea at night. He is scheduled for a sleep study in January.  He denies any syncope.  His brother had myocardial infarction at age of 15. Multiple family members died of ruptured AAA. He has never been screened.  07/15/2013 - Significant improvement in LE edema and SOB, but feels fatigued, no energy. He states that he gained a lot of weight in the last few years and has no motivation to exercise.  10/17/2013 - LE edema stays the same, but he feels significant fatigue. SOB on and off, no chest pain. Complains of paresthesias.  12/23/2013 - since the  last visit patient kept calling with multiple complaints mainly palpitations and fatigue. He is amlodipine was discontinued and he was started on carvedilol 12.5 mg twice a day. He states that his symptoms have improved however he still feels some fatigue. He is working full-time on for himself so he divides his work him into morning and late night shift.  Home Medications  Prior to Admission medications   Medication Sig Start Date End Date Taking? Authorizing Provider  doxazosin (CARDURA) 4 MG tablet TAKE 1 TABLET (4 MG TOTAL) BY MOUTH AT BEDTIME. 06/04/13  Yes Venia Carbon, MD  fluticasone (FLONASE) 50 MCG/ACT nasal spray Place 2 sprays into the nose as needed. 07/11/12  Yes Venia Carbon, MD  metoprolol tartrate (LOPRESSOR) 25 MG tablet TAKE ONE TABLET BY MOUTH TWICE DAILY 07/11/12  Yes Venia Carbon, MD    Family History  Family History  Problem Relation Age of Onset  . Stroke Mother   . Hypertension Father   . Heart disease Father   . Lung cancer Maternal Aunt   . Emphysema Mother   . Emphysema Father     Social History  History   Social History  . Marital Status: Married    Spouse Name: N/A    Number of Children: 2  . Years of Education: N/A   Occupational History  . [employer]     no work lately   Social History Main Topics  .  Smoking status: Never Smoker   . Smokeless tobacco: Current User    Types: Chew  . Alcohol Use: Yes     Comment: 6-10drinks daily  . Drug Use: No  . Sexual Activity: Not on file   Other Topics Concern  . Not on file   Social History Narrative         Review of Systems, as per HPI, otherwise negative General:  No chills, fever, night sweats or weight changes.  Cardiovascular:  No chest pain, dyspnea on exertion, edema, orthopnea, palpitations, paroxysmal nocturnal dyspnea. Dermatological: No rash, lesions/masses Respiratory: No cough, dyspnea Urologic: No hematuria, dysuria Abdominal:   No nausea, vomiting,  diarrhea, bright red blood per rectum, melena, or hematemesis Neurologic:  No visual changes, wkns, changes in mental status. All other systems reviewed and are otherwise negative except as noted above.  Physical Exam  There were no vitals taken for this visit.  General: Pleasant, NAD, obese Psych: Normal affect. Neuro: Alert and oriented X 3. Moves all extremities spontaneously. HEENT: Normal  Neck: Supple without bruits or JVD. Lungs:  Resp regular and unlabored, wheezes Heart: RRR no s3, s4, or murmurs. Abdomen: Soft, non-tender, non-distended, BS + x 4.  Extremities: No clubbing, cyanosis, non-pitting edema at the ankles. DP/PT/Radials 2+ and equal bilaterally.  Labs:  No results found for this basename: CKTOTAL, CKMB, TROPONINI,  in the last 72 hours Lab Results  Component Value Date   WBC 4.1 11/01/2013   HGB 13.9 11/01/2013   HCT 39.1 11/01/2013   MCV 93.5 11/01/2013   PLT 55* 11/01/2013   No results found for this basename: NA, K, CL, CO2, BUN, CREATININE, CALCIUM, LABALBU, PROT, BILITOT, ALKPHOS, ALT, AST, GLUCOSE,  in the last 168 hours Lab Results  Component Value Date   CHOL 209* 11/01/2013   HDL 38* 11/01/2013   LDLCALC 137* 11/01/2013   TRIG 172* 11/01/2013   Accessory Clinical Findings  Echocardiogram -   ECG - normal sinus rhythm, 83 beats per minute, left posterior fascicular block, T-wave abnormalities with possible inferior ischemia. Abnormal EKG.  Left and right cardiac cath: 06/10/13 Final Conclusions:  1. Normal LV function with elevated LVEDP  2. Normal coronary arteries  3. Elevated right heart pressures suspect secondary to diastolic dysfunction (transpulmonic gradient less than 10 mm Hg)  Recommendations: lifestyle modification, medical therapy for CHF  Sherren Mocha  06/10/2013, 2:19 PM     Assessment & Plan  A 48 year old male with  1. Exertional chest pain - abnormal ECG suggesting possible inferior ischemia, multiple risk factors, including  obesity, HLP, HTN. He was considered a hight risk and he underwent a cath that was normal. LVEF normal, but elevated LVEDP.  2. Chronic diastolic CHF - NYHA II non-ischemic, preserved LVEF, elevated LVEDP. The patient is able to work full time with breaks. His major problem was fatigue and shortness of breath. He discontinued metoprolol and started him on amlodipine. Since the last visit in our office, the patient kept calling palpitations. We discontinued amlodipine and started carvedilol 12.5 mg twice a day.His symptoms improved with Carvedilol, but he still feels shortness of breath and fatigue after working for about 4 hours. We will up titrate carvedilol to 18.75 twice a day  Improvement in LE edema and SOB with Lasix. Now euvolemic. We will continue Lasix 40 mg po daily, normal electrolytes and creatinine when checked last months.   3. Hypertension - we will increase carvedilol to 18.75 twice a day  4. Hyperlipidemia -  high TAG, possibly secondary to etoh abuse, elevated AST, we will consider Zetia, omega 3 acids   5. OSA - started on CPAP in January 2015  6. Etoh abuse - counselled  7. Obesity - we will refer him to a cardiac rehab today.  25. Family h/o ruptured AAA in multiple family members - [name] negative  9. Neuropathic pain - IGT - normal HbA1c, pain improved on its own.  Follow up in 6 months with comprehensive metabolic profile.  Dorothy Spark, MD, [facility] 12/23/2013, 7:53 AM

## 2014-02-05 ENCOUNTER — Other Ambulatory Visit: Payer: Self-pay

## 2014-02-05 MED ORDER — FUROSEMIDE 40 MG PO TABS: 40.0000 mg | ORAL_TABLET | Freq: Every day | ORAL | Status: DC

## 2014-02-07 ENCOUNTER — Telehealth: Payer: Self-pay

## 2014-02-07 NOTE — Telephone Encounter (Signed)
Pt request refill on furosemide; advised pt to contact [facility] appears Dr Meda Coffee approved refill on furosemide 02/05/14. Pt voiced understanding.

## 2014-03-22 ENCOUNTER — Other Ambulatory Visit: Payer: Self-pay | Admitting: Internal Medicine

## 2014-03-31 ENCOUNTER — Encounter (HOSPITAL_COMMUNITY): Payer: Self-pay | Admitting: Emergency Medicine

## 2014-03-31 ENCOUNTER — Emergency Department (HOSPITAL_COMMUNITY): Payer: BC Managed Care – PPO

## 2014-03-31 ENCOUNTER — Emergency Department (HOSPITAL_COMMUNITY)
Admission: EM | Admit: 2014-03-31 | Discharge: 2014-03-31 | Disposition: A | Discharge status: Home / Self Care | Payer: BC Managed Care – PPO | Attending: Emergency Medicine | Admitting: Emergency Medicine

## 2014-03-31 DIAGNOSIS — D696 Thrombocytopenia, unspecified: Secondary | ICD-10-CM | POA: Diagnosis not present

## 2014-03-31 DIAGNOSIS — Z8639 Personal history of other endocrine, nutritional and metabolic disease: Secondary | ICD-10-CM | POA: Insufficient documentation

## 2014-03-31 DIAGNOSIS — R011 Cardiac murmur, unspecified: Secondary | ICD-10-CM | POA: Diagnosis not present

## 2014-03-31 DIAGNOSIS — R110 Nausea: Secondary | ICD-10-CM | POA: Diagnosis not present

## 2014-03-31 DIAGNOSIS — Z791 Long term (current) use of non-steroidal anti-inflammatories (NSAID): Secondary | ICD-10-CM | POA: Diagnosis not present

## 2014-03-31 DIAGNOSIS — Z79899 Other long term (current) drug therapy: Secondary | ICD-10-CM | POA: Insufficient documentation

## 2014-03-31 DIAGNOSIS — I509 Heart failure, unspecified: Secondary | ICD-10-CM | POA: Diagnosis not present

## 2014-03-31 DIAGNOSIS — I10 Essential (primary) hypertension: Secondary | ICD-10-CM | POA: Diagnosis not present

## 2014-03-31 DIAGNOSIS — Z8719 Personal history of other diseases of the digestive system: Secondary | ICD-10-CM | POA: Insufficient documentation

## 2014-03-31 DIAGNOSIS — G629 Polyneuropathy, unspecified: Secondary | ICD-10-CM | POA: Diagnosis not present

## 2014-03-31 DIAGNOSIS — R079 Chest pain, unspecified: Secondary | ICD-10-CM | POA: Insufficient documentation

## 2014-03-31 LAB — CBC WITH DIFFERENTIAL/PLATELET
BASOS PCT: 1 % (ref 0–1)
Basophils Absolute: 0 10*3/uL (ref 0.0–0.1)
EOS ABS: 0.1 10*3/uL (ref 0.0–0.7)
Eosinophils Relative: 3 % (ref 0–5)
HCT: 39.6 % (ref 39.0–52.0)
Hemoglobin: 14.1 g/dL (ref 13.0–17.0)
LYMPHS ABS: 0.8 10*3/uL (ref 0.7–4.0)
Lymphocytes Relative: 25 % (ref 12–46)
MCH: 34.4 pg — AB (ref 26.0–34.0)
MCHC: 35.6 g/dL (ref 30.0–36.0)
MCV: 96.6 fL (ref 78.0–100.0)
Monocytes Absolute: 0.3 10*3/uL (ref 0.1–1.0)
Monocytes Relative: 10 % (ref 3–12)
NEUTROS ABS: 1.9 10*3/uL (ref 1.7–7.7)
NEUTROS PCT: 61 % (ref 43–77)
PLATELETS: 39 10*3/uL — AB (ref 150–400)
RBC: 4.1 MIL/uL — ABNORMAL LOW (ref 4.22–5.81)
RDW: 13.7 % (ref 11.5–15.5)
WBC: 3 10*3/uL — ABNORMAL LOW (ref 4.0–10.5)

## 2014-03-31 LAB — BASIC METABOLIC PANEL
Anion gap: 13 (ref 5–15)
BUN: 9 mg/dL (ref 6–23)
CHLORIDE: 103 meq/L (ref 96–112)
CO2: 24 mEq/L (ref 19–32)
Calcium: 8.7 mg/dL (ref 8.4–10.5)
Creatinine, Ser: 0.76 mg/dL (ref 0.50–1.35)
GFR calc non Af Amer: >90 mL/min (ref >90)
GLUCOSE: 129 mg/dL — AB (ref 70–99)
POTASSIUM: 3.8 meq/L (ref 3.7–5.3)
SODIUM: 140 meq/L (ref 137–147)

## 2014-03-31 LAB — I-STAT TROPONIN, ED: Troponin i, poc: 0.01 ng/mL (ref 0.00–0.08)

## 2014-03-31 MED ORDER — ASPIRIN 325 MG PO TABS
325.0000 mg | ORAL_TABLET | Freq: Every day | ORAL | Status: DC
  Administered 2014-03-31: 325 mg via ORAL
  Filled 2014-03-31: qty 1

## 2014-03-31 MED ORDER — OMEPRAZOLE 20 MG PO CPDR: 20.0000 mg | DELAYED_RELEASE_CAPSULE | Freq: Every day | ORAL | Status: DC

## 2014-03-31 MED ORDER — GI COCKTAIL ~~LOC~~
30.0000 mL | Freq: Once | ORAL | Status: AC
  Administered 2014-03-31: 30 mL via ORAL
  Filled 2014-03-31: qty 30

## 2014-03-31 NOTE — ED Provider Notes (Signed)
CSN: [id number]     Arrival date & time 03/31/14  1056 History   First MD Initiated Contact with Patient 03/31/14 1101     Chief Complaint  Patient presents with  . Chest Pain   Patient is a 48 y.o. male presenting with chest pain.  Chest Pain Associated symptoms: fatigue, nausea and shortness of breath   Associated symptoms: no abdominal pain, no cough, no dizziness, no fever, no headache, no palpitations, not vomiting and no weakness     Patient is a 48 y.o. Male who presents with chest pain.  Patient states that he has been having intermittent burning substernal chest pains x 1 week.  Patient states that initially this chest pain radiated to bilateral shoulder blades and down the left arm.  Since that time he has had no shoulder involvment it has just become a burning substernal pain. Initially pain was a 7/10 pain at this time it is a 2/10 pain.  He endorses mild shortness of breath, some nausea, with no evidence of vomiting.  He does have a history of alcohol abute and continues to drink 24 beers per week.  He is a non-smoker.  Patient does have a history of HTN, HL, Diastolic CHF, with a left and right heart cath performed in 12/14 which revealed normal CAD and normal EF.  Patient is followed by Dr. Meda Coffee.  Patient does have a family history of ruptured AAA and MIs with both grandfathers having MIs in their early 78.     Past Medical History  Diagnosis Date  . Allergy   . GERD (gastroesophageal reflux disease)   . Hyperlipidemia   . Hypertension   . Neuropathy    Past Surgical History  Procedure Laterality Date  . Hand surgery  1997   Family History  Problem Relation Age of Onset  . Stroke Mother   . Hypertension Father   . Heart disease Father   . Lung cancer Maternal Aunt   . Emphysema Mother   . Emphysema Father    History  Substance Use Topics  . Smoking status: Never Smoker   . Smokeless tobacco: Current User    Types: Chew  . Alcohol Use: Yes     Comment:  6-10drinks daily    Review of Systems  Constitutional: Positive for fatigue. Negative for fever and chills.  Respiratory: Positive for shortness of breath. Negative for cough, chest tightness and wheezing.   Cardiovascular: Positive for chest pain. Negative for palpitations and leg swelling.  Gastrointestinal: Positive for nausea. Negative for vomiting, abdominal pain, diarrhea and constipation.  Genitourinary: Negative for dysuria, urgency, frequency, hematuria and difficulty urinating.  Neurological: Negative for dizziness, syncope, weakness, light-headedness and headaches.  All other systems reviewed and are negative.     Allergies  Hydrochlorothiazide w-triamterene and Lisinopril  Home Medications   Prior to Admission medications   Medication Sig Start Date End Date Taking? Authorizing Provider  carvedilol (COREG) 12.5 MG tablet Take 18.75 mg by mouth 2 (two) times daily with a meal.   Yes Historical Provider, MD  doxazosin (CARDURA) 4 MG tablet Take 4 mg by mouth daily.   Yes Historical Provider, MD  furosemide (LASIX) 40 MG tablet Take 1 tablet (40 mg total) by mouth daily. 02/05/14  Yes Dorothy Spark, MD  ibuprofen (ADVIL,MOTRIN) 200 MG tablet Take 400 mg by mouth every 6 (six) hours as needed for moderate pain.   Yes Historical Provider, MD  potassium chloride (K-DUR) 10 MEQ tablet Take 1 tablet (  10 mEq total) by mouth daily. 06/25/13  Yes Dorothy Spark, MD  Tetrahydrozoline HCl (VISINE OP) Place 1 drop into both eyes daily.   Yes Historical Provider, MD   BP 130/74  Pulse 63  Temp(Src) 98.1 F (36.7 C)  Resp 19  SpO2 95% Physical Exam  Nursing note and vitals reviewed. Constitutional: He is oriented to person, place, and time. He appears well-developed and well-nourished. No distress.  HENT:  Head: Normocephalic and atraumatic.  Mouth/Throat: Oropharynx is clear and moist. No oropharyngeal exudate.  Eyes: Conjunctivae and EOM are normal. Pupils are equal,  round, and reactive to light. No scleral icterus.  Neck: Normal range of motion. Neck supple. No JVD present. No thyromegaly present.  Cardiovascular: Normal rate, regular rhythm and intact distal pulses.  Exam reveals no gallop and no friction rub.   Murmur (soft 1/6 systolic murmur) heard. Pulmonary/Chest: Effort normal and breath sounds normal. No respiratory distress. He has no wheezes. He has no rales. He exhibits no tenderness.  Abdominal: Soft. Bowel sounds are normal. He exhibits no distension and no mass. There is no tenderness. There is no rebound and no guarding.  Musculoskeletal: Normal range of motion.  Lymphadenopathy:    He has no cervical adenopathy.  Neurological: He is alert and oriented to person, place, and time. He has normal strength. No cranial nerve deficit or sensory deficit. Coordination normal.  Skin: Skin is warm and dry. He is not diaphoretic.  Psychiatric: He has a normal mood and affect. His behavior is normal. Judgment and thought content normal.    ED Course  Procedures (including critical care time) Labs Review Labs Reviewed  CBC WITH DIFFERENTIAL - Abnormal; Notable for the following:    WBC 3.0 (*)    RBC 4.10 (*)    MCH 34.4 (*)    Platelets 39 (*)    All other components within normal limits  BASIC METABOLIC PANEL - Abnormal; Notable for the following:    Glucose, Bld 129 (*)    All other components within normal limits  I-STAT TROPOININ, ED    Imaging Review No results found.   EKG Interpretation   Date/Time:  Monday March 31 2014 11:02:57 EDT Ventricular Rate:  72 PR Interval:  194 QRS Duration: 86 QT Interval:  404 QTC Calculation: 442 R Axis:   52 Text Interpretation:  Normal sinus rhythm Normal ECG Confirmed by DELOS   MD, DOUGLAS ([id number]) on 03/31/2014 2:24:52 PM      MDM   Final diagnoses:  Chest pain, unspecified chest pain type  Chronic congestive heart failure, unspecified congestive heart failure type  Essential  hypertension  Thrombocytopenia   Patient is a 48 y.o. Male who presents to the ED with chest pain and shortness of breath.  Physical exam reveals a non-toxic male in no apparent distress with normal vitals.  Exam unremarkable.  Patient treated here with GI cocktail and ASA with complete relief of symptoms.  History is only mildly suspicious for ACS.  CBC reveals chronic thrombocytopenia which is mildly decreased from baseline.  BMP unremarkable.  Istat troponin is negative.  CXR is negative for PNA, pneumothoracies, or mediasteinal widening.  Chart review reveals normal heart EF and CAD in 12/14.  Patient is completely asymptomatic at this time.  Will discharge the patient home with close follow-up with his cardiologist Dr. Meda Coffee.  Patient to return for worsening chest pain, shortness of breath or any other concerning symptoms. Patient is stable for discharge and states understanding and  agreement with the above plan.  Patient was discussed with Dr. Stark Jock who agrees with the above plan and discharge.  Will cover patient with PPI in the event that this is GERD.     Cherylann Parr, PA-C 03/31/14 1439

## 2014-03-31 NOTE — ED Notes (Signed)
Pt ambulated to restroom. 

## 2014-03-31 NOTE — ED Notes (Signed)
Pt presents to ED with chest pain x 5 days; sometimes radiates to left shoulder and left arm. Father had heart attack at 71. Pt states he always has weakness and numbness in his extremities and it is not different since pain started. Pt states some nausea today but denies vomiting, diarrhea. Has had some sweating, but mostly in hands and feet. Pt drinks approx 1 case of beer per week; no smoking hx - does chew tobacco. NAD noted.

## 2014-03-31 NOTE — Discharge Instructions (Signed)
Chest Pain (Nonspecific) °It is often hard to give a specific diagnosis for the cause of chest pain. There is always a chance that your pain could be related to something serious, such as a heart attack or a blood clot in the lungs. You need to follow up with your health care provider for further evaluation. °CAUSES  °· Heartburn. °· Pneumonia or bronchitis. °· Anxiety or stress. °· Inflammation around your heart (pericarditis) or lung (pleuritis or pleurisy). °· A blood clot in the lung. °· A collapsed lung (pneumothorax). It can develop suddenly on its own (spontaneous pneumothorax) or from trauma to the chest. °· Shingles infection (herpes zoster virus). °The chest wall is composed of bones, muscles, and cartilage. Any of these can be the source of the pain. °· The bones can be bruised by injury. °· The muscles or cartilage can be strained by coughing or overwork. °· The cartilage can be affected by inflammation and become sore (costochondritis). °DIAGNOSIS  °Lab tests or other studies may be needed to find the cause of your pain. Your health care provider may have you take a test called an ambulatory electrocardiogram (ECG). An ECG records your heartbeat patterns over a 24-hour period. You may also have other tests, such as: °· Transthoracic echocardiogram (TTE). During echocardiography, sound waves are used to evaluate how blood flows through your heart. °· Transesophageal echocardiogram (TEE). °· Cardiac monitoring. This allows your health care provider to monitor your heart rate and rhythm in real time. °· Holter monitor. This is a portable device that records your heartbeat and can help diagnose heart arrhythmias. It allows your health care provider to track your heart activity for several days, if needed. °· Stress tests by exercise or by giving medicine that makes the heart beat faster. °TREATMENT  °· Treatment depends on what may be causing your chest pain. Treatment may include: °¨ Acid blockers for  heartburn. °¨ Anti-inflammatory medicine. °¨ Pain medicine for inflammatory conditions. °¨ Antibiotics if an infection is present. °· You may be advised to change lifestyle habits. This includes stopping smoking and avoiding alcohol, caffeine, and chocolate. °· You may be advised to keep your head raised (elevated) when sleeping. This reduces the chance of acid going backward from your stomach into your esophagus. °Most of the time, nonspecific chest pain will improve within 2-3 days with rest and mild pain medicine.  °HOME CARE INSTRUCTIONS  °· If antibiotics were prescribed, take them as directed. Finish them even if you start to feel better. °· For the next few days, avoid physical activities that bring on chest pain. Continue physical activities as directed. °· Do not use any tobacco products, including cigarettes, chewing tobacco, or electronic cigarettes. °· Avoid drinking alcohol. °· Only take medicine as directed by your health care provider. °· Follow your health care provider's suggestions for further testing if your chest pain does not go away. °· Keep any follow-up appointments you made. If you do not go to an appointment, you could develop lasting (chronic) problems with pain. If there is any problem keeping an appointment, call to reschedule. °SEEK MEDICAL CARE IF:  °· Your chest pain does not go away, even after treatment. °· You have a rash with blisters on your chest. °· You have a fever. °SEEK IMMEDIATE MEDICAL CARE IF:  °· You have increased chest pain or pain that spreads to your arm, neck, jaw, back, or abdomen. °· You have shortness of breath. °· You have an increasing cough, or you cough   up blood. °· You have severe back or abdominal pain. °· You feel nauseous or vomit. °· You have severe weakness. °· You faint. °· You have chills. °This is an emergency. Do not wait to see if the pain will go away. Get medical help at once. Call your local emergency services (911 in [place] Do not drive  yourself to the hospital. °MAKE SURE YOU:  °· Understand these instructions. °· Will watch your condition. °· Will get help right away if you are not doing well or get worse. °Document Released: 03/23/2005 Document Revised: 06/18/2013 Document Reviewed: 01/17/2008 °[employer]® Patient Information ©2015 [employer]. This information is not intended to replace advice given to you by your health care provider. Make sure you discuss any questions you have with your health care provider. °Gastroesophageal Reflux Disease, Adult °Gastroesophageal reflux disease (GERD) happens when acid from your stomach flows up into the esophagus. When acid comes in contact with the esophagus, the acid causes soreness (inflammation) in the esophagus. Over time, GERD may create small holes (ulcers) in the lining of the esophagus. °CAUSES  °· Increased body weight. This puts pressure on the stomach, making acid rise from the stomach into the esophagus. °· Smoking. This increases acid production in the stomach. °· Drinking alcohol. This causes decreased pressure in the lower esophageal sphincter (valve or ring of muscle between the esophagus and stomach), allowing acid from the stomach into the esophagus. °· Late evening meals and a full stomach. This increases pressure and acid production in the stomach. °· A malformed lower esophageal sphincter. °Sometimes, no cause is found. °SYMPTOMS  °· Burning pain in the lower part of the mid-chest behind the breastbone and in the mid-stomach area. This may occur twice a week or more often. °· Trouble swallowing. °· Sore throat. °· Dry cough. °· Asthma-like symptoms including chest tightness, shortness of breath, or wheezing. °DIAGNOSIS  °Your caregiver may be able to diagnose GERD based on your symptoms. In some cases, X-rays and other tests may be done to check for complications or to check the condition of your stomach and esophagus. °TREATMENT  °Your caregiver may recommend over-the-counter or  prescription medicines to help decrease acid production. Ask your caregiver before starting or adding any new medicines.  °HOME CARE INSTRUCTIONS  °· Change the factors that you can control. Ask your caregiver for guidance concerning weight loss, quitting smoking, and alcohol consumption. °· Avoid foods and drinks that make your symptoms worse, such as: °¨ Caffeine or alcoholic drinks. °¨ Chocolate. °¨ Peppermint or mint flavorings. °¨ Garlic and onions. °¨ Spicy foods. °¨ Citrus fruits, such as oranges, lemons, or limes. °¨ Tomato-based foods such as sauce, chili, salsa, and pizza. °¨ Fried and fatty foods. °· Avoid lying down for the 3 hours prior to your bedtime or prior to taking a nap. °· Eat small, frequent meals instead of large meals. °· Wear loose-fitting clothing. Do not wear anything tight around your waist that causes pressure on your stomach. °· Raise the head of your bed 6 to 8 inches with wood blocks to help you sleep. Extra pillows will not help. °· Only take over-the-counter or prescription medicines for pain, discomfort, or fever as directed by your caregiver. °· Do not take aspirin, ibuprofen, or other nonsteroidal anti-inflammatory drugs (NSAIDs). °SEEK IMMEDIATE MEDICAL CARE IF:  °· You have pain in your arms, neck, jaw, teeth, or back. °· Your pain increases or changes in intensity or duration. °· You develop nausea, vomiting, or sweating (diaphoresis). °·   You develop shortness of breath, or you faint. °· Your vomit is green, yellow, black, or looks like coffee grounds or blood. °· Your stool is red, bloody, or black. °These symptoms could be signs of other problems, such as heart disease, gastric bleeding, or esophageal bleeding. °MAKE SURE YOU:  °· Understand these instructions. °· Will watch your condition. °· Will get help right away if you are not doing well or get worse. °Document Released: 03/23/2005 Document Revised: 09/05/2011 Document Reviewed: 12/31/2010 °[employer]® Patient  Information ©2015 [employer]. This information is not intended to replace advice given to you by your health care provider. Make sure you discuss any questions you have with your health care provider. ° °

## 2014-03-31 NOTE — ED Notes (Signed)
Pt sts central chest tightness x a few days. sts bloated feeling after he eats. Denies cough, fever, sts sometimes the pain radiated into her shoulder.

## 2014-03-31 NOTE — ED Provider Notes (Signed)
Medical screening examination/treatment/procedure(s) were performed by non-physician practitioner and as supervising physician I was immediately available for consultation/collaboration.   EKG Interpretation   Date/Time:  Monday March 31 2014 11:02:57 EDT Ventricular Rate:  72 PR Interval:  194 QRS Duration: 86 QT Interval:  404 QTC Calculation: 442 R Axis:   52 Text Interpretation:  Normal sinus rhythm Normal ECG Confirmed by Beau Fanny   MD, Miguel Medal ([id number]) on 03/31/2014 2:24:52 PM       Veryl Speak, MD 03/31/14 1643

## 2014-04-21 ENCOUNTER — Ambulatory Visit: Payer: BC Managed Care – PPO | Admitting: Pulmonary Disease

## 2014-04-30 ENCOUNTER — Other Ambulatory Visit: Payer: Self-pay | Admitting: Internal Medicine

## 2014-05-25 ENCOUNTER — Other Ambulatory Visit: Payer: Self-pay | Admitting: Internal Medicine

## 2014-05-27 ENCOUNTER — Ambulatory Visit: Payer: BC Managed Care – PPO | Admitting: Pulmonary Disease

## 2014-06-02 ENCOUNTER — Telehealth: Payer: Self-pay | Admitting: *Deleted

## 2014-06-02 ENCOUNTER — Other Ambulatory Visit: Payer: Self-pay | Admitting: *Deleted

## 2014-06-02 MED ORDER — POTASSIUM CHLORIDE ER 10 MEQ PO TBCR: 10.0000 meq | EXTENDED_RELEASE_TABLET | Freq: Every day | ORAL | Status: DC

## 2014-06-02 NOTE — Telephone Encounter (Signed)
Yes he should take it and we can refill it. KN

## 2014-06-02 NOTE — Telephone Encounter (Signed)
[facility] requests klor con refill for patient. It is on the snapshot, but not on the office visit notes. Should he still be taking this? Please advise. Thanks, MI

## 2014-06-05 ENCOUNTER — Encounter (HOSPITAL_COMMUNITY): Payer: Self-pay | Admitting: Cardiovascular Disease

## 2014-06-11 ENCOUNTER — Ambulatory Visit: Payer: BC Managed Care – PPO | Admitting: Pulmonary Disease

## 2014-06-30 ENCOUNTER — Other Ambulatory Visit: Payer: Self-pay | Admitting: Internal Medicine

## 2014-07-08 ENCOUNTER — Other Ambulatory Visit: Payer: Self-pay | Admitting: Cardiology

## 2014-07-09 ENCOUNTER — Ambulatory Visit: Payer: BC Managed Care – PPO | Admitting: Cardiology

## 2014-07-22 ENCOUNTER — Ambulatory Visit (INDEPENDENT_AMBULATORY_CARE_PROVIDER_SITE_OTHER): Payer: BLUE CROSS/BLUE SHIELD | Admitting: Cardiology

## 2014-07-22 ENCOUNTER — Encounter: Payer: Self-pay | Admitting: Cardiology

## 2014-07-22 VITALS — BP 120/60 | HR 78 | Ht 67.0 in | Wt 260.0 lb

## 2014-07-22 DIAGNOSIS — I5032 Chronic diastolic (congestive) heart failure: Secondary | ICD-10-CM

## 2014-07-22 DIAGNOSIS — M792 Neuralgia and neuritis, unspecified: Secondary | ICD-10-CM

## 2014-07-22 DIAGNOSIS — I10 Essential (primary) hypertension: Secondary | ICD-10-CM

## 2014-07-22 DIAGNOSIS — I5031 Acute diastolic (congestive) heart failure: Secondary | ICD-10-CM

## 2014-07-22 MED ORDER — POTASSIUM CHLORIDE CRYS ER 20 MEQ PO TBCR
20.0000 meq | EXTENDED_RELEASE_TABLET | Freq: Every day | ORAL | Status: DC
Start: 2014-07-22 — End: 2014-09-10

## 2014-07-22 MED ORDER — GABAPENTIN 300 MG PO CAPS: 300.0000 mg | ORAL_CAPSULE | Freq: Three times a day (TID) | ORAL | Status: DC

## 2014-07-22 MED ORDER — FUROSEMIDE 40 MG PO TABS: 40.0000 mg | ORAL_TABLET | Freq: Two times a day (BID) | ORAL | Status: DC

## 2014-07-22 NOTE — Progress Notes (Signed)
Patient ID: HAYDON DORRIS, male   DOB: 02/14/1966, 49 y.o.   MRN: [id number]    Patient Name: Colton Porter Date of Encounter: 07/22/2014  Primary Care Provider:  Viviana Simpler, MD Primary Cardiologist:  Ena Dawley, M.D., [facility]  Problem List   Past Medical History  Diagnosis Date  . Allergy   . GERD (gastroesophageal reflux disease)   . Hyperlipidemia   . Hypertension   . Neuropathy    Past Surgical History  Procedure Laterality Date  . Hand surgery  1997  . Left and right heart catheterization with coronary angiogram N/A 06/10/2013    Procedure: LEFT AND RIGHT HEART CATHETERIZATION WITH CORONARY ANGIOGRAM;  Surgeon: Blane Ohara, MD;  Location: [facility] CATH LAB;  Service: Cardiovascular;  Laterality: N/A;   Allergies  Allergies  Allergen Reactions  . Hydrochlorothiazide W-Triamterene Other (See Comments)    REACTION: dizzy, nausea  . Lisinopril Other (See Comments)    REACTION: cough, decreased libido   HPI  49 year old male with history of hyperlipidemia, obesity, hypertension, significant drinking history who was recently seen by his PCP for complains of exertional SOB and chest pain. The patient describes DOE after walking 40-50 feet that is followed by chest tightness with radiation to his left arm. After he stops the symptoms resolve within 5 minutes. He denies resting chest pain.  The patient states that he also developed lower extremity edema. In the past he was prescribed Lasix with some improvement but was taken of it and he is not sure why. He has never smoked. He drinks 12-18 beers a day chronically and has known elevation of liver enzymes.  He also states that he snores and has significant sleep apnea at night. He is scheduled for a sleep study in January.  He denies any syncope.  His brother had myocardial infarction at age of 35. Multiple family members died of ruptured AAA. He has never been screened.  07/15/2013 - Significant improvement in LE edema  and SOB, but feels fatigued, no energy. He states that he gained a lot of weight in the last few years and has no motivation to exercise.  10/17/2013 - LE edema stays the same, but he feels significant fatigue. SOB on and off, no chest pain. Complains of paresthesias.  12/23/2013 - since the last visit patient kept calling with multiple complaints mainly palpitations and fatigue. He is amlodipine was discontinued and he was started on carvedilol 12.5 mg twice a day. He states that his symptoms have improved however he still feels some fatigue. He is working full-time on for himself so he divides his work him into morning and late night shift.  07/22/2014 - the patient is coming after 6 months, he states that he has good days and bad days, he complains about tingling and pain in his both legs. He denies any chest pain or SOB at rest. He does have DOE on mild exertion. He feels out of energy and doesn't exercise because of his leg pain. He hasn't lost any weight. He continues to have LLE and feels that they are getting worse.  Home Medications  Prior to Admission medications   Medication Sig Start Date End Date Taking? Authorizing Provider  doxazosin (CARDURA) 4 MG tablet TAKE 1 TABLET (4 MG TOTAL) BY MOUTH AT BEDTIME. 06/04/13  Yes Venia Carbon, MD  fluticasone (FLONASE) 50 MCG/ACT nasal spray Place 2 sprays into the nose as needed. 07/11/12  Yes Venia Carbon, MD  metoprolol tartrate (LOPRESSOR) 25  MG tablet TAKE ONE TABLET BY MOUTH TWICE DAILY 07/11/12  Yes Venia Carbon, MD    Family History  Family History  Problem Relation Age of Onset  . Stroke Mother   . Hypertension Father   . Heart disease Father   . Lung cancer Maternal Aunt   . Emphysema Mother   . Emphysema Father   . Heart attack Neg Hx   . Stroke Paternal Grandmother     Social History  History   Social History  . Marital Status: Married    Spouse Name: N/A    Number of Children: 2  . Years of Education: N/A     Occupational History  . [employer]     no work lately   Social History Main Topics  . Smoking status: Never Smoker   . Smokeless tobacco: Current User    Types: Chew  . Alcohol Use: Yes     Comment: 6-10drinks daily  . Drug Use: No  . Sexual Activity: Not on file   Other Topics Concern  . Not on file   Social History Narrative         Review of Systems, as per HPI, otherwise negative General:  No chills, fever, night sweats or weight changes.  Cardiovascular:  No chest pain, dyspnea on exertion, edema, orthopnea, palpitations, paroxysmal nocturnal dyspnea. Dermatological: No rash, lesions/masses Respiratory: No cough, dyspnea Urologic: No hematuria, dysuria Abdominal:   No nausea, vomiting, diarrhea, bright red blood per rectum, melena, or hematemesis Neurologic:  No visual changes, wkns, changes in mental status. All other systems reviewed and are otherwise negative except as noted above.  Physical Exam  Blood pressure 120/60, pulse 78, height 5\' 7"  (1.702 m), weight 260 lb (117.935 kg).  General: Pleasant, NAD, obese Psych: Normal affect. Neuro: Alert and oriented X 3. Moves all extremities spontaneously. HEENT: Normal  Neck: Supple without bruits or JVD. Lungs:  Resp regular and unlabored, wheezes Heart: RRR no s3, s4, or murmurs. Abdomen: Soft, non-tender, non-distended, BS + x 4.  Extremities: No clubbing, cyanosis, non-pitting edema at the ankles. DP/PT/Radials 2+ and equal bilaterally.  Labs:  No results for input(s): CKTOTAL, CKMB, TROPONINI in the last 72 hours. Lab Results  Component Value Date   WBC 3.0* 03/31/2014   HGB 14.1 03/31/2014   HCT 39.6 03/31/2014   MCV 96.6 03/31/2014   PLT 39* 03/31/2014   No results for input(s): NA, K, CL, CO2, BUN, CREATININE, CALCIUM, PROT, BILITOT, ALKPHOS, ALT, AST, GLUCOSE in the last 168 hours.  Invalid input(s): LABALBU Lab Results  Component Value Date   CHOL 209* 11/01/2013   HDL 38* 11/01/2013    LDLCALC 137* 11/01/2013   TRIG 172* 11/01/2013   Accessory Clinical Findings  Echocardiogram -   ECG - normal sinus rhythm, 83 beats per minute, left posterior fascicular block, T-wave abnormalities with possible inferior ischemia. Abnormal EKG.  Left and right cardiac cath: 06/10/13 Final Conclusions:  1. Normal LV function with elevated LVEDP  2. Normal coronary arteries  3. Elevated right heart pressures suspect secondary to diastolic dysfunction (transpulmonic gradient less than 10 mm Hg)  Recommendations: lifestyle modification, medical therapy for CHF  Sherren Mocha  06/10/2013, 2:19 PM     Assessment & Plan  A 49 year old male with  1. Exertional chest pain - abnormal ECG suggesting possible inferior ischemia, multiple risk factors, including obesity, HLP, HTN. He was considered a hight risk and he underwent a cath that was normal. LVEF  normal, but elevated LVEDP.  2. Chronic diastolic CHF - NYHA II non-ischemic, preserved LVEF, elevated LVEDP. The patient is able to work full time with breaks. His major problem was fatigue and shortness of breath. He discontinued metoprolol and started him on amlodipine. Since the last visit in our office, the patient kept calling palpitations. We discontinued amlodipine and started carvedilol 12.5 mg twice a day.His symptoms improved with Carvedilol, but he still feels shortness of breath and fatigue after working for about 4 hours. We will up titrate carvedilol to 18.75 twice a day  We will increase Lasix to 40 mg po BID and KCl to 20 mEq PO daily.  3. Hypertension - we will increase carvedilol to 25 mg PO twice a day  4. Hyperlipidemia - high TAG, possibly secondary to etoh abuse, elevated AST, we will consider Zetia, omega 3 acids   5. OSA - started on CPAP in January 2015  6. Etoh abuse - counselled  7. Obesity - we will refer him to a cardiac rehab today.  86. Family h/o ruptured AAA in multiple family members - [name]  negative  9. Neuropathic pain - IGT - normal HbA1c, we will start gabapentin 300 mg po TID, good pulses.  Follow up in 3 months with comprehensive metabolic profile.  Dorothy Spark, MD, [facility] 07/22/2014, 9:58 AM

## 2014-07-22 NOTE — Patient Instructions (Signed)
Your physician has recommended you make the following change in your medication:   START TAKING GABAPENTIN 300 MG THREE TIMES DAILY  INCREASE YOUR LASIX TO 40 MG TWICE DAILY---TAKE ONE IN THE MORNING AND ONE IN THE EVENING  START TAKING POTASSIUM CHLORIDE 20 mEq ONCE DAILY    Your physician recommends that you return for lab work in: [place] [facility]   Your physician recommends that you schedule a follow-up appointment in: [place]

## 2014-07-28 DIAGNOSIS — K7030 Alcoholic cirrhosis of liver without ascites: Secondary | ICD-10-CM | POA: Insufficient documentation

## 2014-07-28 DIAGNOSIS — Z8719 Personal history of other diseases of the digestive system: Secondary | ICD-10-CM | POA: Insufficient documentation

## 2014-07-28 DIAGNOSIS — K7031 Alcoholic cirrhosis of liver with ascites: Secondary | ICD-10-CM

## 2014-07-28 HISTORY — DX: Alcoholic cirrhosis of liver with ascites: K70.31

## 2014-08-05 ENCOUNTER — Ambulatory Visit: Payer: Self-pay | Admitting: Cardiology

## 2014-08-10 ENCOUNTER — Other Ambulatory Visit: Payer: Self-pay | Admitting: Cardiology

## 2014-08-26 ENCOUNTER — Telehealth: Payer: Self-pay | Admitting: Cardiology

## 2014-08-26 ENCOUNTER — Telehealth: Payer: Self-pay | Admitting: Internal Medicine

## 2014-08-26 NOTE — Telephone Encounter (Signed)
Pts sister calling to inform Dr Meda Coffee that the pt has been extremely non-compliant with his health and is currently experiencing LEE and neuropathy pain.  Pts sister states she is unsure if the pt is being compliant with his diet, decreasing his etoh intake, and taking his meds as prescribed.  Pts sister states his sodium intake is not strict as well.  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  Pts sister states he was non-compliant with calling back cardiac rehab as ordered by Dr Meda Coffee.  Sister states he ignored multiple phone calls and letters sent by Cardiac rehab for setting up new pt orientation.  Sister states she got his PCP to work him in for an OV this week for further eval of LEE, and neuropathy pain.  Sister states the pt is in NOD, with no cp, sob, cough, wheezing, palpitations, dizziness, pre-syncopal or syncopal issues at this time.  Sister states that when the pt see's his PCP, she will further follow-up with our office on diagnosis.  Informed the sister that I will route this message to Dr Meda Coffee for further review and follow-up with any recommendations.  Advised the sister to have the pt take all meds prescribed, reduce alcohol intake, reduce sodium intake, be compliant with a healthy diet, drink water, weigh himself every morning, and take the gabapentin for the neuropathy pain.  Sister verbalized understanding and agrees with this plan.  Sister gracious for all the assistance provided.

## 2014-08-26 NOTE — Telephone Encounter (Signed)
I agree with everything that you have recommended and he should follow with cardiac rehab. Thank you, KN

## 2014-08-26 NOTE — Telephone Encounter (Signed)
ok for switch

## 2014-08-26 NOTE — Telephone Encounter (Signed)
That is okay with me 

## 2014-08-26 NOTE — Telephone Encounter (Signed)
Patient's sister,Ann,called and said her brother would like to switch from [facility] to [place].  Patient's legs are huge. Lelon Frohlich thinks it's from his CHF and since Dr.G.treats patient's father for CHF he'd like to switch to Dr.Gutierrez.

## 2014-08-26 NOTE — Telephone Encounter (Signed)
°  Pt's sister called in and is concerned. Pt is having lots of leg swelling and numbness in his toes. Needs advise on what needs to be done. Please call and advise.

## 2014-08-27 ENCOUNTER — Encounter: Payer: Self-pay | Admitting: Family Medicine

## 2014-08-27 ENCOUNTER — Telehealth: Payer: Self-pay | Admitting: Radiology

## 2014-08-27 ENCOUNTER — Ambulatory Visit (INDEPENDENT_AMBULATORY_CARE_PROVIDER_SITE_OTHER): Payer: BLUE CROSS/BLUE SHIELD | Admitting: Family Medicine

## 2014-08-27 VITALS — BP 136/82 | HR 72 | Temp 98.0°F | Wt 277.5 lb

## 2014-08-27 DIAGNOSIS — R609 Edema, unspecified: Secondary | ICD-10-CM

## 2014-08-27 DIAGNOSIS — F10288 Alcohol dependence with other alcohol-induced disorder: Secondary | ICD-10-CM

## 2014-08-27 DIAGNOSIS — R600 Localized edema: Secondary | ICD-10-CM

## 2014-08-27 DIAGNOSIS — I5032 Chronic diastolic (congestive) heart failure: Secondary | ICD-10-CM

## 2014-08-27 DIAGNOSIS — K7031 Alcoholic cirrhosis of liver with ascites: Secondary | ICD-10-CM

## 2014-08-27 DIAGNOSIS — D696 Thrombocytopenia, unspecified: Secondary | ICD-10-CM

## 2014-08-27 DIAGNOSIS — I10 Essential (primary) hypertension: Secondary | ICD-10-CM

## 2014-08-27 LAB — CBC WITH DIFFERENTIAL/PLATELET
BASOS ABS: 0 10*3/uL (ref 0.0–0.1)
Basophils Relative: 0.9 % (ref 0.0–3.0)
Eosinophils Absolute: 0.1 10*3/uL (ref 0.0–0.7)
Eosinophils Relative: 4 % (ref 0.0–5.0)
HCT: 36.3 % — ABNORMAL LOW (ref 39.0–52.0)
Hemoglobin: 12.7 g/dL — ABNORMAL LOW (ref 13.0–17.0)
LYMPHS PCT: 24 % (ref 12.0–46.0)
Lymphs Abs: 0.6 10*3/uL — ABNORMAL LOW (ref 0.7–4.0)
MCHC: 35 g/dL (ref 30.0–36.0)
MCV: 100.8 fl — ABNORMAL HIGH (ref 78.0–100.0)
MONOS PCT: 8 % (ref 3.0–12.0)
Monocytes Absolute: 0.2 10*3/uL (ref 0.1–1.0)
NEUTROS PCT: 63.1 % (ref 43.0–77.0)
Neutro Abs: 1.6 10*3/uL (ref 1.4–7.7)
Platelets: 33 10*3/uL — CL (ref 150.0–400.0)
RBC: 3.6 Mil/uL — AB (ref 4.22–5.81)
RDW: 14.5 % (ref 11.5–15.5)
WBC: 2.6 10*3/uL — ABNORMAL LOW (ref 4.0–10.5)

## 2014-08-27 LAB — COMPREHENSIVE METABOLIC PANEL
ALT: 25 U/L (ref 0–53)
AST: 60 U/L — ABNORMAL HIGH (ref 0–37)
Albumin: 2.3 g/dL — ABNORMAL LOW (ref 3.5–5.2)
Alkaline Phosphatase: 128 U/L — ABNORMAL HIGH (ref 39–117)
BUN: 10 mg/dL (ref 6–23)
CO2: 27 mEq/L (ref 19–32)
Calcium: 8.2 mg/dL — ABNORMAL LOW (ref 8.4–10.5)
Chloride: 102 mEq/L (ref 96–112)
Creatinine, Ser: 0.81 mg/dL (ref 0.40–1.50)
GFR: 108.12 mL/min (ref >60.00)
GLUCOSE: 92 mg/dL (ref 70–99)
POTASSIUM: 3.4 meq/L — AB (ref 3.5–5.1)
Sodium: 134 mEq/L — ABNORMAL LOW (ref 135–145)
Total Bilirubin: 2.9 mg/dL — ABNORMAL HIGH (ref 0.2–1.2)
Total Protein: 8.2 g/dL (ref 6.0–8.3)

## 2014-08-27 LAB — MICROALBUMIN / CREATININE URINE RATIO
Creatinine,U: 22.7 mg/dL
Microalb Creat Ratio: 3.1 mg/g (ref 0.0–30.0)

## 2014-08-27 LAB — TSH: TSH: 1.13 u[IU]/mL (ref 0.35–4.50)

## 2014-08-27 NOTE — Progress Notes (Signed)
Pre visit review using our clinic review tool, if applicable. No additional management support is needed unless otherwise documented below in the visit note. 

## 2014-08-27 NOTE — Telephone Encounter (Signed)
[facility] called a critical lab result, PLT count - 33,000. Results sent to Dr Loyce Dys discussed with Dr Lorelei Pont.

## 2014-08-27 NOTE — Progress Notes (Signed)
BP 136/82 mmHg  Pulse 72  Temp(Src) 98 F (36.7 C) (Oral)  Wt 277 lb 8 oz (125.873 kg)  SpO2 95%   CC: leg swelling  Subjective:    Patient ID: Colton Porter, male    DOB: 1966/05/17, 49 y.o.   MRN: [id number]  HPI: Colton Porter is a 49 y.o. male presenting on 08/27/2014 for Leg Swelling   Presents with sister Colton Porter. Prior patient of Dr Colton Porter presents to transfer care with concern for leg swelling. Chronic issue with leg swelling but acutely worse over last 4 days. This has led to increased pain in toes when walking. Recently lasix increased to 40mg  bid 06/2014 by cardiology. This has not helped. Has gained 17 lbs in last 1 month. Chronic dyspnea on exertion.   Denies fevers, cough. No significant palpitations.  Currently endorses 6 beers per day but sister and wife endorse he drinks more than this. Started drinking more after mother's death years ago.  He has dx of GERD, HLD, HTN and neuropathy as well as OSA on CPAP. He has chronic diastolic heart failure followed by Dr Colton Porter. He also has listed alcohol dependence and previously drank 12-18 beers a day but no diagnosis of liver disease. He has been followed by heme for thrombocytopenia but has not been seen in 2 years. Denies any active bleeding or abdominal pain. No confusion.   Catheterization 05/2013 showing normal LV function with elevated LVEDP, normal CA, elevated R heart pressures 2/2 diastolic dysfunction.   Saw GI Dr Colton Porter 07/2013 who warned him of alcoholic cirrhosis but pt continued drinking.  Last liver US 1.5 yrs ago: 7/13 [name] splenomegaly, hepatomegaly 2014 LFTs several draws; AST elevated, ALT normal, Alb slightly low, INR 1.4 2014 platelets low (50-70 range  Relevant past medical, surgical, family and social history reviewed and updated as indicated. Interim medical history since our last visit reviewed. Allergies and medications reviewed and updated. Current Outpatient Prescriptions on File Prior to Visit    Medication Sig  . carvedilol (COREG) 12.5 MG tablet Take 18.75 mg by mouth 2 (two) times daily with a meal.  . doxazosin (CARDURA) 4 MG tablet Take 4 mg by mouth daily.  . furosemide (LASIX) 40 MG tablet Take 1 tablet (40 mg total) by mouth 2 (two) times daily. Take one in the morning and one in the evening  . gabapentin (NEURONTIN) 300 MG capsule Take 1 capsule (300 mg total) by mouth 3 (three) times daily.  Marland Kitchen ibuprofen (ADVIL,MOTRIN) 200 MG tablet Take 400 mg by mouth every 6 (six) hours as needed for moderate pain.  Marland Kitchen omeprazole (PRILOSEC) 20 MG capsule Take 20 mg by mouth daily.  . potassium chloride SA (K-DUR,KLOR-CON) 20 MEQ tablet Take 1 tablet (20 mEq total) by mouth daily.  . Tetrahydrozoline HCl (VISINE OP) Place 1 drop into both eyes daily.   No current facility-administered medications on file prior to visit.    Review of Systems Per HPI unless specifically indicated above     Objective:    BP 136/82 mmHg  Pulse 72  Temp(Src) 98 F (36.7 C) (Oral)  Wt 277 lb 8 oz (125.873 kg)  SpO2 95%  Wt Readings from Last 3 Encounters:  08/27/14 277 lb 8 oz (125.873 kg)  07/22/14 260 lb (117.935 kg)  12/23/13 257 lb (116.574 kg)    Physical Exam  Constitutional: He appears well-developed and well-nourished. No distress.  HENT:  Mouth/Throat: Oropharynx is clear and moist. No oropharyngeal exudate.  Cardiovascular:  Normal rate, regular rhythm, normal heart sounds and intact distal pulses.   No murmur heard. Pulmonary/Chest: Effort normal and breath sounds normal. No respiratory distress. He has no wheezes. He has no rales.  Abdominal: Soft. Bowel sounds are normal. He exhibits distension. He exhibits no mass. There is hepatosplenomegaly. There is no tenderness. There is no rigidity, no rebound, no guarding and negative Murphy's sign.  Musculoskeletal: He exhibits edema.  1+ pitting, marked nonpitting pedal edema bilateral  Skin: Skin is warm and dry. No rash noted.  Nursing  note and vitals reviewed.      Assessment & Plan:   Problem List Items Addressed This Visit    Thrombocytopenia    Again concern for liver disease - recheck today.      Relevant Orders   TSH (Completed)   CBC with Differential/Platelet (Completed)   US Abdomen Complete   Pedal edema    ?related to ascites and low protein. Mildly pitting. Doubt coming from [facility].      Essential hypertension    bp stable on current regimen of cardura coreg and lasix.      Relevant Orders   Microalbumin / creatinine urine ratio (Completed)   Chronic diastolic heart failure    Doubt weight gain/leg swelling predominantly cardiac in nature. Lasix recently increased to 40mg  bid. Discussed possible addition of spironolactone.      Relevant Orders   TSH (Completed)   Alcoholic cirrhosis of liver with ascites - Primary    Previously elevated LFTs and thrombocytopenia with marked weight gain and worsening pedal edema over last several days. Concern for alcoholic cirrhosis with ascites. Will check labs and update abdominal ultrasound. Discussed importance of tapering off alcohol, discussed slowly decrease by 1-2 beers weekly. If labwork concerning for cirrhosis, will recommend <2gm sodium restricted diet, consider spironolactone and also refer back to GI.       Relevant Orders   Comprehensive metabolic panel (Completed)   CBC with Differential/Platelet (Completed)   Lactate Dehydrogenase (Completed)   US Abdomen Complete   Alcohol dependence    Large portio of visit spent discussing concern for liver damage from excessive drinking. Pt endorses 6 beers, sister thinks he drinks more. I advised he start tapering off all alcohol for benefit of his liver. Pt expressed understanding. See above.          Follow up plan: Return in about 1 month (around 09/27/2014), or if symptoms worsen or fail to improve, for follow up visit.

## 2014-08-27 NOTE — Patient Instructions (Addendum)
Slowly cut back and ultimately stop drinking.  I've worried about your liver function. I think this is not coming from heart.  Blood work today. Then we will contact you with plan. We will schedule abdominal ultrasound - pass by Marion's office for ultrasound.  Return as needed or in 1 month for follow up.

## 2014-08-27 NOTE — Telephone Encounter (Signed)
Noted. H/o chronic thrombocytopenia, last 36k. See results note.

## 2014-08-27 NOTE — Telephone Encounter (Signed)
[facility] called a critical lab result. PLT - 33,000

## 2014-08-28 ENCOUNTER — Other Ambulatory Visit: Payer: BLUE CROSS/BLUE SHIELD

## 2014-08-28 ENCOUNTER — Other Ambulatory Visit: Payer: Self-pay | Admitting: Family Medicine

## 2014-08-28 ENCOUNTER — Encounter: Payer: Self-pay | Admitting: Family Medicine

## 2014-08-28 DIAGNOSIS — K7031 Alcoholic cirrhosis of liver with ascites: Secondary | ICD-10-CM

## 2014-08-28 LAB — LACTATE DEHYDROGENASE: LDH: 211 U/L (ref 94–250)

## 2014-08-28 MED ORDER — ALPRAZOLAM 0.5 MG PO TABS: 0.5000 mg | ORAL_TABLET | Freq: Two times a day (BID) | ORAL | Status: DC | PRN

## 2014-08-28 MED ORDER — SPIRONOLACTONE 25 MG PO TABS: 25.0000 mg | ORAL_TABLET | Freq: Every day | ORAL | Status: DC

## 2014-08-28 NOTE — Assessment & Plan Note (Signed)
Large portio of visit spent discussing concern for liver damage from excessive drinking. Pt endorses 6 beers, sister thinks he drinks more. I advised he start tapering off all alcohol for benefit of his liver. Pt expressed understanding. See above.

## 2014-08-28 NOTE — Telephone Encounter (Signed)
Rx called in as directed.   

## 2014-08-28 NOTE — Assessment & Plan Note (Signed)
Again concern for liver disease - recheck today.

## 2014-08-28 NOTE — Assessment & Plan Note (Addendum)
Previously elevated LFTs and thrombocytopenia with marked weight gain and worsening pedal edema over last several days. Concern for alcoholic cirrhosis with ascites. Will check labs and update abdominal ultrasound. Discussed importance of tapering off alcohol, discussed slowly decrease by 1-2 beers weekly. If labwork concerning for cirrhosis, will recommend <2gm sodium restricted diet, consider spironolactone and also refer back to GI.

## 2014-08-28 NOTE — Assessment & Plan Note (Signed)
?  related to ascites and low protein. Mildly pitting. Doubt coming from [facility].

## 2014-08-28 NOTE — Telephone Encounter (Signed)
Called patient. plz phone in alprazolam.

## 2014-08-28 NOTE — Assessment & Plan Note (Signed)
bp stable on current regimen of cardura coreg and lasix.

## 2014-08-28 NOTE — Assessment & Plan Note (Signed)
Doubt weight gain/leg swelling predominantly cardiac in nature. Lasix recently increased to 40mg  bid. Discussed possible addition of spironolactone.

## 2014-08-29 ENCOUNTER — Telehealth: Payer: Self-pay

## 2014-08-29 NOTE — Telephone Encounter (Signed)
Pt left v/m requesting cb; pt wants to know if should continue taking furosemide since starting the spironolactone.Please advise.

## 2014-08-29 NOTE — Telephone Encounter (Signed)
Patient notified and lab appt scheduled.  

## 2014-08-29 NOTE — Telephone Encounter (Signed)
Yes lets continue lasix. Recommend come in mid next week for labwork.

## 2014-09-02 ENCOUNTER — Other Ambulatory Visit: Payer: Self-pay | Admitting: Family Medicine

## 2014-09-02 DIAGNOSIS — R799 Abnormal finding of blood chemistry, unspecified: Secondary | ICD-10-CM

## 2014-09-03 ENCOUNTER — Ambulatory Visit
Admission: RE | Admit: 2014-09-03 | Discharge: 2014-09-03 | Disposition: A | Discharge status: Home / Self Care | Payer: BLUE CROSS/BLUE SHIELD | Source: Ambulatory Visit | Attending: Family Medicine | Admitting: Family Medicine

## 2014-09-03 ENCOUNTER — Other Ambulatory Visit (INDEPENDENT_AMBULATORY_CARE_PROVIDER_SITE_OTHER): Payer: BLUE CROSS/BLUE SHIELD

## 2014-09-03 DIAGNOSIS — K7031 Alcoholic cirrhosis of liver with ascites: Secondary | ICD-10-CM

## 2014-09-03 DIAGNOSIS — R799 Abnormal finding of blood chemistry, unspecified: Secondary | ICD-10-CM

## 2014-09-03 DIAGNOSIS — D696 Thrombocytopenia, unspecified: Secondary | ICD-10-CM

## 2014-09-03 LAB — POTASSIUM: Potassium: 3.6 mEq/L (ref 3.5–5.1)

## 2014-09-04 ENCOUNTER — Telehealth: Payer: Self-pay | Admitting: Family Medicine

## 2014-09-04 ENCOUNTER — Other Ambulatory Visit: Payer: Self-pay | Admitting: Family Medicine

## 2014-09-04 DIAGNOSIS — I5031 Acute diastolic (congestive) heart failure: Secondary | ICD-10-CM

## 2014-09-04 DIAGNOSIS — K7031 Alcoholic cirrhosis of liver with ascites: Secondary | ICD-10-CM

## 2014-09-04 DIAGNOSIS — I10 Essential (primary) hypertension: Secondary | ICD-10-CM

## 2014-09-04 DIAGNOSIS — I5032 Chronic diastolic (congestive) heart failure: Secondary | ICD-10-CM

## 2014-09-04 MED ORDER — SPIRONOLACTONE 25 MG PO TABS: 25.0000 mg | ORAL_TABLET | Freq: Two times a day (BID) | ORAL | Status: DC

## 2014-09-04 MED ORDER — FUROSEMIDE 40 MG PO TABS
40.0000 mg | ORAL_TABLET | Freq: Every day | ORAL | Status: DC
Start: 2014-09-04 — End: 2014-11-29

## 2014-09-04 NOTE — Telephone Encounter (Signed)
Spoke with Ann

## 2014-09-04 NOTE — Telephone Encounter (Signed)
Please call patient's sister about test results.

## 2014-09-10 ENCOUNTER — Ambulatory Visit (INDEPENDENT_AMBULATORY_CARE_PROVIDER_SITE_OTHER): Payer: BLUE CROSS/BLUE SHIELD | Admitting: Family Medicine

## 2014-09-10 ENCOUNTER — Other Ambulatory Visit (INDEPENDENT_AMBULATORY_CARE_PROVIDER_SITE_OTHER): Payer: BLUE CROSS/BLUE SHIELD

## 2014-09-10 ENCOUNTER — Encounter: Payer: Self-pay | Admitting: Family Medicine

## 2014-09-10 ENCOUNTER — Encounter: Payer: Self-pay | Admitting: *Deleted

## 2014-09-10 VITALS — BP 132/78 | HR 80 | Temp 98.2°F | Wt 266.5 lb

## 2014-09-10 DIAGNOSIS — F10288 Alcohol dependence with other alcohol-induced disorder: Secondary | ICD-10-CM

## 2014-09-10 DIAGNOSIS — D619 Aplastic anemia, unspecified: Secondary | ICD-10-CM

## 2014-09-10 DIAGNOSIS — K7031 Alcoholic cirrhosis of liver with ascites: Secondary | ICD-10-CM

## 2014-09-10 DIAGNOSIS — R600 Localized edema: Secondary | ICD-10-CM

## 2014-09-10 DIAGNOSIS — D696 Thrombocytopenia, unspecified: Secondary | ICD-10-CM

## 2014-09-10 DIAGNOSIS — D61818 Other pancytopenia: Secondary | ICD-10-CM | POA: Insufficient documentation

## 2014-09-10 LAB — COMPREHENSIVE METABOLIC PANEL
ALT: 27 U/L (ref 0–53)
AST: 60 U/L — AB (ref 0–37)
Albumin: 2.2 g/dL — ABNORMAL LOW (ref 3.5–5.2)
Alkaline Phosphatase: 149 U/L — ABNORMAL HIGH (ref 39–117)
BUN: 8 mg/dL (ref 6–23)
CALCIUM: 8.5 mg/dL (ref 8.4–10.5)
CHLORIDE: 104 meq/L (ref 96–112)
CO2: 30 meq/L (ref 19–32)
Creatinine, Ser: 0.83 mg/dL (ref 0.40–1.50)
GFR: 105.1 mL/min (ref >60.00)
Glucose, Bld: 100 mg/dL — ABNORMAL HIGH (ref 70–99)
POTASSIUM: 4.2 meq/L (ref 3.5–5.1)
Sodium: 135 mEq/L (ref 135–145)
Total Bilirubin: 2.4 mg/dL — ABNORMAL HIGH (ref 0.2–1.2)
Total Protein: 7.9 g/dL (ref 6.0–8.3)

## 2014-09-10 LAB — BASIC METABOLIC PANEL
BUN: 9 mg/dL (ref 6–23)
CO2: 30 meq/L (ref 19–32)
Calcium: 8.6 mg/dL (ref 8.4–10.5)
Chloride: 104 mEq/L (ref 96–112)
Creatinine, Ser: 0.82 mg/dL (ref 0.40–1.50)
GFR: 106.58 mL/min (ref >60.00)
Glucose, Bld: 100 mg/dL — ABNORMAL HIGH (ref 70–99)
Potassium: 4.2 mEq/L (ref 3.5–5.1)
SODIUM: 135 meq/L (ref 135–145)

## 2014-09-10 MED ORDER — SPIRONOLACTONE 50 MG PO TABS: 50.0000 mg | ORAL_TABLET | Freq: Two times a day (BID) | ORAL | Status: DC

## 2014-09-10 NOTE — Assessment & Plan Note (Signed)
Denies easy bruising/bleeding.

## 2014-09-10 NOTE — Assessment & Plan Note (Signed)
Reviewed abd [name] results with patient and sister. Check CMP today. Slowly increase spironolactone to goal 50mg  bid and continue lasix 40mg  daily. Stop Kdur. Pass by Marion's office to schedule GI appointment.

## 2014-09-10 NOTE — Assessment & Plan Note (Signed)
Continue to increase spironolactone as tolerated. Continue lasix 40mg  daily.

## 2014-09-10 NOTE — Progress Notes (Signed)
Pre visit review using our clinic review tool, if applicable. No additional management support is needed unless otherwise documented below in the visit note. 

## 2014-09-10 NOTE — Patient Instructions (Addendum)
Pass by Colton Porter's office to schedule appointment with GI.  Slowly increase spironolactone. I want you to take 50mg  in am and 25mg  in pm for 1 week then increase to 50mg  twice daily. Stop potassium pill.  Labwork today then return in 1 week for repeat labwork. Return to see me in 1 month for follow up

## 2014-09-10 NOTE — Progress Notes (Signed)
BP 132/78 mmHg  Pulse 80  Temp(Src) 98.2 F (36.8 C) (Oral)  Wt 266 lb 8 oz (120.884 kg)   CC: f/u labs and ultrasound  Subjective:    Patient ID: Colton Porter, male    DOB: 10-08-1965, 49 y.o.   MRN: [id number]  HPI: Colton Porter is a 49 y.o. male presenting on 09/10/2014 for Follow-up   Presents with sister today.  Recently lost father who was also my patient. Not currently taking alprazolam.  Newly dx alcoholic cirrhosis with splenomegaly, thrombocytopenia (actually pancytopenia), and hypoalbuminemia. We recently started spironolactone and have been slowly increasing as tolerated. Due for labwork today.   Persistent dependent leg swelling that worsens as day goes on. Denies fevers/chils. Endorses abd discomfort described as indigestion/bloating. Compliant with omeprazole 20mg  daily.  Works [employer] - [place] working. Having trouble with leg swelling and pain.   Relevant past medical, surgical, family and social history reviewed and updated as indicated. Interim medical history since our last visit reviewed. Allergies and medications reviewed and updated. Current Outpatient Prescriptions on File Prior to Visit  Medication Sig  . carvedilol (COREG) 12.5 MG tablet Take 18.75 mg by mouth 2 (two) times daily with a meal.  . doxazosin (CARDURA) 4 MG tablet Take 4 mg by mouth daily.  . furosemide (LASIX) 40 MG tablet Take 1 tablet (40 mg total) by mouth daily.  Marland Kitchen gabapentin (NEURONTIN) 300 MG capsule Take 1 capsule (300 mg total) by mouth 3 (three) times daily.  Marland Kitchen ibuprofen (ADVIL,MOTRIN) 200 MG tablet Take 400 mg by mouth every 6 (six) hours as needed for moderate pain.  Marland Kitchen omeprazole (PRILOSEC) 20 MG capsule Take 20 mg by mouth daily.  Marland Kitchen ALPRAZolam (XANAX) 0.5 MG tablet Take 1 tablet (0.5 mg total) by mouth 2 (two) times daily as needed for anxiety. (Patient not taking: Reported on 09/10/2014)  . Tetrahydrozoline HCl (VISINE OP) Place 1 drop into both eyes daily.   No  current facility-administered medications on file prior to visit.    Review of Systems Per HPI unless specifically indicated above     Objective:    BP 132/78 mmHg  Pulse 80  Temp(Src) 98.2 F (36.8 C) (Oral)  Wt 266 lb 8 oz (120.884 kg)  Wt Readings from Last 3 Encounters:  09/10/14 266 lb 8 oz (120.884 kg)  08/27/14 277 lb 8 oz (125.873 kg)  07/22/14 260 lb (117.935 kg)    Physical Exam  Constitutional: He appears well-developed and well-nourished. No distress.  HENT:  Mouth/Throat: Oropharynx is clear and moist. No oropharyngeal exudate.  Cardiovascular: Normal rate, regular rhythm, normal heart sounds and intact distal pulses.   No murmur heard. Pulmonary/Chest: Effort normal and breath sounds normal. No respiratory distress. He has no wheezes. He has no rales.  Abdominal: Soft. Bowel sounds are normal. He exhibits distension. He exhibits no mass. There is hepatosplenomegaly (not markedly appreciated today due to body habitus). There is no tenderness. There is no rigidity, no rebound, no guarding and negative Murphy's sign.  obese  Musculoskeletal: He exhibits edema (tr pitting, marked nonpitting edema BLE).  Skin: Skin is warm and dry. No rash noted.  Nursing note and vitals reviewed.  Results for orders placed or performed in visit on 09/03/14  Potassium  Result Value Ref Range   Potassium 3.6 3.5 - 5.1 mEq/L      Assessment & Plan:   Problem List Items Addressed This Visit    Thrombocytopenia    Denies easy bruising/bleeding.  Pedal edema    Continue to increase spironolactone as tolerated. Continue lasix 40mg  daily.      Pancytopenia, acquired    Merits close monitoring.      Alcoholic cirrhosis of liver with ascites - Primary    Reviewed abd [name] results with patient and sister. Check CMP today. Slowly increase spironolactone to goal 50mg  bid and continue lasix 40mg  daily. Stop Kdur. Pass by [facility]'s office to schedule GI appointment.        Relevant Orders   Comprehensive metabolic panel   Alcohol dependence    Continues to slowly cut down on alcohol intake - down to 4 beers/day. Motivated to continue cutting down.          Follow up plan: Return in about 1 month (around 10/11/2014), or if symptoms worsen or fail to improve, for follow up visit.

## 2014-09-10 NOTE — Assessment & Plan Note (Signed)
Merits close monitoring. 

## 2014-09-10 NOTE — Assessment & Plan Note (Signed)
Continues to slowly cut down on alcohol intake - down to 4 beers/day. Motivated to continue cutting down.

## 2014-09-13 ENCOUNTER — Other Ambulatory Visit: Payer: Self-pay | Admitting: Family Medicine

## 2014-09-13 DIAGNOSIS — I10 Essential (primary) hypertension: Secondary | ICD-10-CM

## 2014-09-13 DIAGNOSIS — K7031 Alcoholic cirrhosis of liver with ascites: Secondary | ICD-10-CM

## 2014-09-13 DIAGNOSIS — D61818 Other pancytopenia: Secondary | ICD-10-CM

## 2014-09-16 ENCOUNTER — Encounter: Payer: Self-pay | Admitting: Physician Assistant

## 2014-09-16 ENCOUNTER — Ambulatory Visit (INDEPENDENT_AMBULATORY_CARE_PROVIDER_SITE_OTHER): Payer: BLUE CROSS/BLUE SHIELD | Admitting: Physician Assistant

## 2014-09-16 ENCOUNTER — Other Ambulatory Visit (INDEPENDENT_AMBULATORY_CARE_PROVIDER_SITE_OTHER): Payer: BLUE CROSS/BLUE SHIELD

## 2014-09-16 VITALS — BP 136/74 | HR 84 | Ht 66.75 in | Wt 263.0 lb

## 2014-09-16 DIAGNOSIS — K7030 Alcoholic cirrhosis of liver without ascites: Secondary | ICD-10-CM

## 2014-09-16 DIAGNOSIS — R161 Splenomegaly, not elsewhere classified: Secondary | ICD-10-CM

## 2014-09-16 DIAGNOSIS — R609 Edema, unspecified: Secondary | ICD-10-CM

## 2014-09-16 DIAGNOSIS — D696 Thrombocytopenia, unspecified: Secondary | ICD-10-CM

## 2014-09-16 LAB — HEPATITIS B SURFACE ANTIBODY,QUALITATIVE: Hep B S Ab: NEGATIVE

## 2014-09-16 LAB — HEPATITIS C ANTIBODY: HCV AB: NEGATIVE

## 2014-09-16 LAB — HEPATITIS B SURFACE ANTIGEN: Hepatitis B Surface Ag: NEGATIVE

## 2014-09-16 LAB — AMMONIA: Ammonia: 59 umol/L — ABNORMAL HIGH (ref 11–35)

## 2014-09-16 NOTE — Progress Notes (Signed)
I agree with the above note, plan. He needs to stop drinking.

## 2014-09-16 NOTE — Progress Notes (Signed)
Patient ID: Colton Porter, male   DOB: 09/11/1965, 49 y.o.   MRN: [id number]   Subjective:    Patient ID: Colton Porter, male    DOB: [birth year], 49 y.o.   MRN: [id number]  HPI Colton Porter is a 49 year old white male known to Dr. Ardis Hughs from colonoscopy done in March 2015. He had 3 small polyps removed at that time all of which were hyperplastic and he is slated for 10 year interval follow-up. He was also noted to have small internal Hemorrhoids. At this time he is being referred back by Dr. [name] for evaluation of new diagnosis of cirrhosis. Patient had an upper abdominal ultrasound done on 09/13/2014 revealing a cirrhotic liver and recanalized umbilical vein as well as splenomegaly was no ascites noted. Most recent labs from 09/10/2014 showed albumin of 2.7 alkaline phosphatase 149 AST of 60 MALT 27 platelet count of 33,000 and MCV of 100 , PT  pending. Patient had recently become establish with Dr. [name] and had gone in for evaluation of peripheral edema. He has been started on Aldactone 25 mg twice daily, and lasix 40 mg daily- and says that the fluid in his legs is significantly improved he is down about 15 pounds. Patient does have significant history of daily EtOH use/abuse. He says he can drink up to 20 beers per day. He was asked to cut down after his diagnosis and now is drinking 4-6 beers per day. He has been doing this for several years, his sister says heavy daily use over the past 62 or 9 years since there mother died. She is working but says he is significantly fatigued and is having a hard time standing due to discomfort in his legs.  Review of Systems Pertinent positive and negative review of systems were noted in the above HPI section.  All other review of systems was otherwise negative.  Outpatient Encounter Prescriptions as of 09/16/2014  Medication Sig  . ALPRAZolam (XANAX) 0.5 MG tablet Take 1 tablet (0.5 mg total) by mouth 2 (two) times daily as needed for anxiety.  .  carvedilol (COREG) 12.5 MG tablet Take 18.75 mg by mouth 2 (two) times daily with a meal.  . doxazosin (CARDURA) 4 MG tablet Take 4 mg by mouth daily.  . furosemide (LASIX) 40 MG tablet Take 1 tablet (40 mg total) by mouth daily.  Marland Kitchen gabapentin (NEURONTIN) 300 MG capsule Take 1 capsule (300 mg total) by mouth 3 (three) times daily.  Marland Kitchen ibuprofen (ADVIL,MOTRIN) 200 MG tablet Take 400 mg by mouth every 6 (six) hours as needed for moderate pain.  Marland Kitchen omeprazole (PRILOSEC) 20 MG capsule Take 20 mg by mouth daily.  Marland Kitchen spironolactone (ALDACTONE) 50 MG tablet Take 1 tablet (50 mg total) by mouth 2 (two) times daily.  . Tetrahydrozoline HCl (VISINE OP) Place 1 drop into both eyes daily.   Allergies  Allergen Reactions  . Hydrochlorothiazide W-Triamterene Other (See Comments)    REACTION: dizzy, nausea  . Lisinopril Other (See Comments)    REACTION: cough, decreased libido   Patient Active Problem List   Diagnosis Date Noted  . Pancytopenia, acquired 09/10/2014  . Alcoholic cirrhosis of liver with ascites 07/28/2014  . Chronic diastolic heart failure 87/86/7672  . Essential hypertension 06/04/2013  . OSA on CPAP 07/11/2012  . Alcohol dependence 07/11/2012  . Thrombocytopenia 12/15/2011  . Obesity 11/16/2011  . Routine general medical examination at a health care facility 09/22/2010  . NEUROPATHY 05/27/2009  . Pedal edema 09/22/2008  . HYPERLIPIDEMIA  02/15/2007  . ALLERGIC RHINITIS 02/15/2007  . GERD 02/15/2007   History   Social History  . Marital Status: Married    Spouse Name: N/A  . Number of Children: 2  . Years of Education: N/A   Occupational History  . [employer]     no work lately   Social History Main Topics  . Smoking status: Never Smoker   . Smokeless tobacco: Current User    Types: Chew  . Alcohol Use: 0.0 oz/week    0 Standard drinks or equivalent per week     Comment: 4-6 drinks daily  . Drug Use: No  . Sexual Activity: Not on file   Other Topics Concern   . Not on file   Social History Narrative        Colton Porter's family history includes Emphysema in his father and mother; Heart disease in his father; Hypertension in his father; Lung cancer in his maternal aunt; Stroke in his mother and paternal grandmother. There is no history of Heart attack.      Objective:    Filed Vitals:   09/16/14 0836  BP: 136/74  Pulse: 84    Physical Exam  well-developed obese white male in no acute distress accompanied by his sister . Blood pressure 136/74 pulse 84 height 5 foot 6 weight 263, BMI 42.4. HEENT; nontraumatic normocephalic EOMI PERRLA sclera anicteric, Supple ;no JVD, Cardiovascular; regular rate and rhythm with S1-S2 no murmur or gallop, Pulmonary ;clear bilaterally, Abdomen ;obese soft no appreciable fluid wave spleen is enlarged and palpable at least 3 for a rest below the left costal margin nontender liver nonpalpable, he does have some bluish discoloration around the umbilicus, Rectal; exam not done, Extremities ;2+ edema to the knees bilaterally, Positive palmar erythema, Neuropsych; mood and affect appropriate no asterixis       Assessment & Plan:   #1 49 yo WM alcoholic with new dx of decompensated Cirrhosis with fluid overload, elevated LFT's likely secondary to mild ETOH hepatitis, and splenomegaly Severe thrombocytopenia-plts  33,000.. This has been and issue over the past couple of years  ? All cirrhosis related. #2 Obesity  #3 HTN #4 OSA #5 hyperplastic polyps  Plan; Continue same dose of diuretics-lasix 40 mg daily, and Aldactone is to change to 50 mg BID Start 2 gm sodium diet Stop ETOH!! This was discussed at length- he needs to discontinue altogether now. Check AFP, PT ,Hepatitis serologies,ammonia Schedule for EGD to assess for varices- with Dr Lyndee Leo discussed in detail with pt and he is agreeable to proceed. I had an honest discussion with pt about advanced cirrhosis, significantly shortened life  span,especially with continued ETOH consumption, potential future need for liver transplantation, and need for substance abuse counseling. Will refer to behavioral Health to initiate counseling   Amy Genia Harold PA-C 09/16/2014   Cc: Ria Bush, MD

## 2014-09-16 NOTE — Patient Instructions (Addendum)
Please go to the basement level to have your labs drawn.  Try to stay on a 2 gram sodium diet. We have given you a number to Behavior Health to call for an appointment. Call [phone]. Dr Orion Crook office.   You have been scheduled for an endoscopy. Please follow written instructions given to you at your visit today. If you use inhalers (even only as needed), please bring them with you on the day of your procedure. Your physician has requested that you go to www.startemmi.com and enter the access code given to you at your visit today. This web site gives a general overview about your procedure. However, you should still follow specific instructions given to you by our office regarding your preparation for the procedure.  We made you an appointment to follow up with Dr. Ardis Hughs, 11-11-2014 at 10:30 am .

## 2014-09-17 ENCOUNTER — Other Ambulatory Visit: Payer: BLUE CROSS/BLUE SHIELD

## 2014-09-17 LAB — AFP TUMOR MARKER: AFP TUMOR MARKER: 7.5 ng/mL — AB (ref <6.1)

## 2014-09-25 ENCOUNTER — Other Ambulatory Visit: Payer: Self-pay

## 2014-09-25 MED ORDER — RIFAXIMIN 550 MG PO TABS: 550.0000 mg | ORAL_TABLET | Freq: Two times a day (BID) | ORAL | Status: DC

## 2014-09-26 HISTORY — PX: ESOPHAGOGASTRODUODENOSCOPY: SHX1529

## 2014-10-01 ENCOUNTER — Encounter: Payer: Self-pay | Admitting: Family Medicine

## 2014-10-01 ENCOUNTER — Ambulatory Visit (INDEPENDENT_AMBULATORY_CARE_PROVIDER_SITE_OTHER): Payer: BLUE CROSS/BLUE SHIELD | Admitting: Family Medicine

## 2014-10-01 ENCOUNTER — Encounter: Payer: Self-pay | Admitting: Gastroenterology

## 2014-10-01 VITALS — BP 126/68 | HR 80 | Temp 98.3°F | Wt 268.5 lb

## 2014-10-01 DIAGNOSIS — R600 Localized edema: Secondary | ICD-10-CM | POA: Diagnosis not present

## 2014-10-01 DIAGNOSIS — F10288 Alcohol dependence with other alcohol-induced disorder: Secondary | ICD-10-CM | POA: Diagnosis not present

## 2014-10-01 DIAGNOSIS — K648 Other hemorrhoids: Secondary | ICD-10-CM

## 2014-10-01 DIAGNOSIS — I10 Essential (primary) hypertension: Secondary | ICD-10-CM

## 2014-10-01 DIAGNOSIS — K7031 Alcoholic cirrhosis of liver with ascites: Secondary | ICD-10-CM | POA: Diagnosis not present

## 2014-10-01 DIAGNOSIS — N529 Male erectile dysfunction, unspecified: Secondary | ICD-10-CM

## 2014-10-01 LAB — BASIC METABOLIC PANEL
BUN: 8 mg/dL (ref 6–23)
CHLORIDE: 102 meq/L (ref 96–112)
CO2: 24 mEq/L (ref 19–32)
Calcium: 8.3 mg/dL — ABNORMAL LOW (ref 8.4–10.5)
Creatinine, Ser: 0.85 mg/dL (ref 0.40–1.50)
GFR: 102.23 mL/min (ref >60.00)
Glucose, Bld: 95 mg/dL (ref 70–99)
POTASSIUM: 3.5 meq/L (ref 3.5–5.1)
Sodium: 132 mEq/L — ABNORMAL LOW (ref 135–145)

## 2014-10-01 MED ORDER — HYDROCORTISONE 2.5 % RE CREA: 1.0000 "application " | TOPICAL_CREAM | Freq: Two times a day (BID) | RECTAL | Status: DC

## 2014-10-01 MED ORDER — SILDENAFIL CITRATE 100 MG PO TABS: 50.0000 mg | ORAL_TABLET | Freq: Every day | ORAL | Status: DC | PRN

## 2014-10-01 NOTE — Assessment & Plan Note (Signed)
Continue to encourage complete cessation.

## 2014-10-01 NOTE — Progress Notes (Signed)
BP 126/68 mmHg  Pulse 80  Temp(Src) 98.3 F (36.8 C) (Oral)  Wt 268 lb 8 oz (121.791 kg)   CC: 3 wk f/u visit  Subjective:    Patient ID: Colton Porter, male    DOB: 02/15/1966, 49 y.o.   MRN: [id number]  HPI: [name] is a 49 y.o. male presenting on 10/01/2014 for Follow-up   Recent dx alcoholic cirrhosis with splenomegaly, thrombocytopenia (actually pancytopenia), and hypoalbuminemia.   Saw GI - pending EGD to eval for varices. They also emphasized need to quit smoking, also discussed possible liver transplant with patient, referred to beh health for substance abuse counseling. Now on spironolactone 50mg  bid and lasix 40mg  daily. Started on xifaxan 550mg  bid.   Works [employer] - [employer] working. Having trouble with leg swelling and pain.   Feeling well yesterday and today.  Has been doing well with neuropathies even off gabapentin. EtOH - down to 0-3 beers/day.   Requests handicap placard for R heel bone spur/plantar fasciitis flare.   Increased salads and vegetables.   Hemorrhoid acting up - ongoing for last 2 years.  Trouble obtaining and keeping erection - requests trial of viagra.   Relevant past medical, surgical, family and social history reviewed and updated as indicated. Interim medical history since our last visit reviewed. Allergies and medications reviewed and updated. Current Outpatient Prescriptions on File Prior to Visit  Medication Sig  . carvedilol (COREG) 12.5 MG tablet Take 18.75 mg by mouth 2 (two) times daily with a meal.  . doxazosin (CARDURA) 4 MG tablet Take 4 mg by mouth daily.  . furosemide (LASIX) 40 MG tablet Take 1 tablet (40 mg total) by mouth daily.  Marland Kitchen ibuprofen (ADVIL,MOTRIN) 200 MG tablet Take 400 mg by mouth every 6 (six) hours as needed for moderate pain.  Marland Kitchen omeprazole (PRILOSEC) 20 MG capsule Take 20 mg by mouth daily.  . rifaximin (XIFAXAN) 550 MG TABS tablet Take 1 tablet (550 mg total) by mouth 2 (two) times daily.  Marland Kitchen  spironolactone (ALDACTONE) 50 MG tablet Take 1 tablet (50 mg total) by mouth 2 (two) times daily.  . Tetrahydrozoline HCl (VISINE OP) Place 1 drop into both eyes daily.  Marland Kitchen ALPRAZolam (XANAX) 0.5 MG tablet Take 1 tablet (0.5 mg total) by mouth 2 (two) times daily as needed for anxiety. (Patient not taking: Reported on 10/01/2014)  . gabapentin (NEURONTIN) 300 MG capsule Take 1 capsule (300 mg total) by mouth 3 (three) times daily. (Patient not taking: Reported on 10/01/2014)   No current facility-administered medications on file prior to visit.    Review of Systems Per HPI unless specifically indicated above     Objective:    BP 126/68 mmHg  Pulse 80  Temp(Src) 98.3 F (36.8 C) (Oral)  Wt 268 lb 8 oz (121.791 kg)  Wt Readings from Last 3 Encounters:  10/01/14 268 lb 8 oz (121.791 kg)  09/16/14 263 lb (119.296 kg)  09/10/14 266 lb 8 oz (120.884 kg)    Physical Exam  Constitutional: He appears well-developed and well-nourished. No distress.  HENT:  Mouth/Throat: Oropharynx is clear and moist. No oropharyngeal exudate.  Cardiovascular: Normal rate, regular rhythm, normal heart sounds and intact distal pulses.   No murmur heard. Pulmonary/Chest: Effort normal and breath sounds normal. No respiratory distress. He has no wheezes. He has no rales.  Genitourinary: Rectal exam shows no external hemorrhoid and no fissure.  No hemorrhoid noted.  Musculoskeletal: He exhibits edema (tr pedal edema, marked nonpitting  edema).  Skin: Skin is warm and dry. No rash noted.  Psychiatric: He has a normal mood and affect.  Nursing note and vitals reviewed.  Results for orders placed or performed in visit on 09/16/14  AFP tumor marker  Result Value Ref Range   AFP-Tumor Marker 7.5 (H) <6.1 ng/mL  Hepatitis B surface antibody  Result Value Ref Range   Hep B S Ab NEG NEGATIVE  Hepatitis B surface antigen  Result Value Ref Range   Hepatitis B Surface Ag NEGATIVE NEGATIVE  Hepatitis C antibody    Result Value Ref Range   HCV Ab NEGATIVE NEGATIVE  Ammonia  Result Value Ref Range   Ammonia 59 (H) 11 - 35 umol/L      Assessment & Plan:   Problem List Items Addressed This Visit    Pedal edema    Continue meds. Check Cr and K today.      Internal hemorrhoid    Exam normal today. Start anusol hc cream prn.      Relevant Medications   sildenafil (VIAGRA) tablet   Essential hypertension    Chronic, stable. Continue regimen.      Relevant Medications   sildenafil (VIAGRA) tablet   Erectile dysfunction    Trial viagra. Discussed common side effects.      Alcoholic cirrhosis of liver with ascites - Primary    Continue spironolactone and lasix at current doses. xifaxan recently started. F/u with GI upcoming EGD. Appreciate their care of patient.      Relevant Orders   Basic metabolic panel   Alcohol dependence    Continue to encourage complete cessation.          Follow up plan: Return in about 3 months (around 12/31/2014), or as needed, for follow up visit.

## 2014-10-01 NOTE — Progress Notes (Signed)
Pre visit review using our clinic review tool, if applicable. No additional management support is needed unless otherwise documented below in the visit note. 

## 2014-10-01 NOTE — Assessment & Plan Note (Signed)
Continue meds. Check Cr and K today.

## 2014-10-01 NOTE — Assessment & Plan Note (Signed)
Exam normal today. Start anusol hc cream prn.

## 2014-10-01 NOTE — Assessment & Plan Note (Signed)
Trial viagra. Discussed common side effects.

## 2014-10-01 NOTE — Assessment & Plan Note (Signed)
Chronic, stable. Continue regimen. 

## 2014-10-01 NOTE — Patient Instructions (Addendum)
You are doing well today.  Keep appointment with GI.  Ok to use gabapentin only as needed.  Blood work one more time.  Return as needed or in 3 months for follow up.  Good job with quitting alcohol - goal completely off alcohol.

## 2014-10-01 NOTE — Assessment & Plan Note (Signed)
Continue spironolactone and lasix at current doses. xifaxan recently started. F/u with GI upcoming EGD. Appreciate their care of patient.

## 2014-10-13 ENCOUNTER — Ambulatory Visit (AMBULATORY_SURGERY_CENTER): Payer: BLUE CROSS/BLUE SHIELD | Admitting: Gastroenterology

## 2014-10-13 ENCOUNTER — Encounter: Payer: Self-pay | Admitting: Gastroenterology

## 2014-10-13 VITALS — BP 126/95 | HR 66 | Temp 98.0°F | Resp 21

## 2014-10-13 DIAGNOSIS — K7031 Alcoholic cirrhosis of liver with ascites: Secondary | ICD-10-CM

## 2014-10-13 DIAGNOSIS — K3189 Other diseases of stomach and duodenum: Secondary | ICD-10-CM

## 2014-10-13 DIAGNOSIS — K766 Portal hypertension: Secondary | ICD-10-CM

## 2014-10-13 MED ORDER — SODIUM CHLORIDE 0.9 % IV SOLN: 500.0000 mL | INTRAVENOUS | Status: DC

## 2014-10-13 NOTE — Op Note (Signed)
[place]  [address]. [place], [id number]   ENDOSCOPY PROCEDURE REPORT  PATIENT: Colton Porter, Colton Porter  MR#: [id number] BIRTHDATE: [birth year] , 89  yrs. old GENDER: male ENDOSCOPIST: Milus Banister, MD PROCEDURE DATE:  10/13/2014 PROCEDURE:  EGD, screening ASA CLASS:     Class III INDICATIONS:  screening for varices. MEDICATIONS: Monitored anesthesia care and Propofol 200 mg IV TOPICAL ANESTHETIC: none  DESCRIPTION OF PROCEDURE: After the risks benefits and alternatives of the procedure were thoroughly explained, informed consent was obtained.  The LB [id number] endoscope was introduced through the mouth and advanced to the second portion of the duodenum , Without limitations.  The instrument was slowly withdrawn as the mucosa was fully examined.  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  The examination was otherwise normal; no esophageal or gastric varices. Retroflexed views revealed no abnormalities.     The scope was then withdrawn from the patient and the procedure completed.  COMPLICATIONS: There were no immediate complications.  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  The examination was otherwise normal; no esophageal or gastric varices   RECOMMENDATIONS: My office will get in touch to schedule return office visit in 6-8 weeks to discuss your cirrhosis, adjust diuretics if needed.  You will also have labs the day prior (cbc, cmet, inr).  Continue trying to cut back, stop drinking alcohol.   eSigned:  Milus Banister, MD 10/13/2014 9:20 AM   cc: Ria Bush, MD

## 2014-10-13 NOTE — Patient Instructions (Signed)
YOU HAD AN ENDOSCOPIC PROCEDURE TODAY AT [facility]:   Refer to the procedure report that was given to you for any specific questions about what was found during the examination.  If the procedure report does not answer your questions, please call your gastroenterologist to clarify.  If you requested that your care partner not be given the details of your procedure findings, then the procedure report has been included in a sealed envelope for you to review at your convenience later.  YOU SHOULD EXPECT: Some feelings of bloating in the abdomen. Passage of more gas than usual.  Walking can help get rid of the air that was put into your GI tract during the procedure and reduce the bloating. If you had a lower endoscopy (such as a colonoscopy or flexible sigmoidoscopy) you may notice spotting of blood in your stool or on the toilet paper. If you underwent a bowel prep for your procedure, you may not have a normal bowel movement for a few days.  Please Note:  You might notice some irritation and congestion in your nose or some drainage.  This is from the oxygen used during your procedure.  There is no need for concern and it should clear up in a day or so.  SYMPTOMS TO REPORT IMMEDIATELY:    Following upper endoscopy (EGD)  Vomiting of blood or coffee ground material  New chest pain or pain under the shoulder blades  Painful or persistently difficult swallowing  New shortness of breath  Fever of 100F or higher  Black, tarry-looking stools  For urgent or emergent issues, a gastroenterologist can be reached at any hour by calling [phone].   DIET: Your first meal following the procedure should be a small meal and then it is ok to progress to your normal diet. Heavy or fried foods are harder to digest and may make you feel nauseous or bloated.  Likewise, meals heavy in dairy and vegetables can increase bloating.  Drink plenty of fluids but you should avoid alcoholic beverages  for 24 hours.  ACTIVITY:  You should plan to take it easy for the rest of today and you should NOT DRIVE or use heavy machinery until tomorrow (because of the sedation medicines used during the test).    FOLLOW UP: Our staff will call the number listed on your records the next business day following your procedure to check on you and address any questions or concerns that you may have regarding the information given to you following your procedure. If we do not reach you, we will leave a message.  However, if you are feeling well and you are not experiencing any problems, there is no need to return our call.  We will assume that you have returned to your regular daily activities without incident.  If any biopsies were taken you will be contacted by phone or by letter within the next 1-3 weeks.  Please call us at [phone] if you have not heard about the biopsies in 3 weeks.    SIGNATURES/CONFIDENTIALITY: You and/or your care partner have signed paperwork which will be entered into your electronic medical record.  These signatures attest to the fact that that the information above on your After Visit Summary has been reviewed and is understood.  Full responsibility of the confidentiality of this discharge information lies with you and/or your care-partner.  See Dr. In office as scheduled by 3rd floor staff.

## 2014-10-13 NOTE — Progress Notes (Signed)
To recovery, report to Hodges, RN, VSS 

## 2014-10-14 ENCOUNTER — Telehealth: Payer: Self-pay | Admitting: *Deleted

## 2014-10-14 NOTE — Telephone Encounter (Signed)
Message left

## 2014-10-15 ENCOUNTER — Ambulatory Visit: Payer: BLUE CROSS/BLUE SHIELD | Admitting: Family Medicine

## 2014-10-17 ENCOUNTER — Other Ambulatory Visit: Payer: Self-pay

## 2014-10-17 DIAGNOSIS — K7460 Unspecified cirrhosis of liver: Secondary | ICD-10-CM

## 2014-10-21 NOTE — Progress Notes (Signed)
Patient ID: Colton Porter, male   DOB: 1966-04-28, 49 y.o.   MRN: [id number]    Patient Name: Colton Porter Date of Encounter: 10/22/2014  Primary Care Provider:  Ria Bush, MD Primary Cardiologist:  Ena Dawley, M.D., [facility]  Problem List   Past Medical History  Diagnosis Date  . Allergy   . GERD (gastroesophageal reflux disease)   . Hyperlipidemia   . Hypertension   . Neuropathy   . Thrombocytopenia 12/15/2011  . Chronic diastolic heart failure 11/26/7033  . Alcohol dependence 07/11/2012  . Alcoholic cirrhosis of liver with ascites 07/2014   Past Surgical History  Procedure Laterality Date  . Hand surgery  1997  . Left and right heart catheterization with coronary angiogram N/A 06/10/2013    Procedure: LEFT AND RIGHT HEART CATHETERIZATION WITH CORONARY ANGIOGRAM;  Surgeon: Blane Ohara, MD;  Location: [facility] CATH LAB;  Service: Cardiovascular;  Laterality: N/A;  . Colonoscopy  08/2013    hyperplastic polyps, hemorrhoids Ardis Hughs)   Allergies  Allergies  Allergen Reactions  . Hydrochlorothiazide W-Triamterene Other (See Comments)    REACTION: dizzy, nausea  . Lisinopril Other (See Comments)    REACTION: cough, decreased libido   CC: DOE, LE edema  HPI  49 year old male with history of hyperlipidemia, obesity, hypertension, significant drinking history who was recently seen by his PCP for complains of exertional SOB and chest pain. The patient describes DOE after walking 40-50 feet that is followed by chest tightness with radiation to his left arm. After he stops the symptoms resolve within 5 minutes. He denies resting chest pain.  The patient states that he also developed lower extremity edema. In the past he was prescribed Lasix with some improvement but was taken of it and he is not sure why. He has never smoked. He drinks 12-18 beers a day chronically and has known elevation of liver enzymes.  He also states that he snores and has significant sleep apnea at  night. He is scheduled for a sleep study in January.  He denies any syncope.  His brother had myocardial infarction at age of 107. Multiple family members died of ruptured AAA. He has never been screened.  07/15/2013 - Significant improvement in LE edema and SOB, but feels fatigued, no energy. He states that he gained a lot of weight in the last few years and has no motivation to exercise.  10/17/2013 - LE edema stays the same, but he feels significant fatigue. SOB on and off, no chest pain. Complains of paresthesias.  12/23/2013 - since the last visit patient kept calling with multiple complaints mainly palpitations and fatigue. He is amlodipine was discontinued and he was started on carvedilol 12.5 mg twice a day. He states that his symptoms have improved however he still feels some fatigue. He is working full-time on for himself so he divides his work him into morning and late night shift.  07/22/2014 - the patient is coming after 6 months, he states that he has good days and bad days, he complains about tingling and pain in his both legs. He denies any chest pain or SOB at rest. He does have DOE on mild exertion. He feels out of energy and doesn't exercise because of his leg pain. He hasn't lost any weight. He continues to have LLE and feels that they are getting worse.  10/22/2014 - the patient quit drinking alcohol and feels significantly better, he has lost 4 lbs within 1 week. 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 No orthopnea, PND, improved LE edema. No palpitations or syncope.  Home Medications  Prior to Admission medications   Medication Sig Start Date End Date Taking? Authorizing Provider  doxazosin (CARDURA) 4 MG tablet TAKE 1 TABLET (4 MG TOTAL) BY MOUTH AT BEDTIME. 06/04/13  Yes Venia Carbon, MD  fluticasone (FLONASE) 50 MCG/ACT nasal spray Place 2 sprays into the nose as needed. 07/11/12  Yes Venia Carbon, MD  metoprolol  tartrate (LOPRESSOR) 25 MG tablet TAKE ONE TABLET BY MOUTH TWICE DAILY 07/11/12  Yes Venia Carbon, MD    Family History  Family History  Problem Relation Age of Onset  . Stroke Mother   . Hypertension Father   . Heart disease Father   . Lung cancer Maternal Aunt   . Emphysema Mother   . Emphysema Father   . Heart attack Neg Hx   . Stroke Paternal Grandmother     Social History  History   Social History  . Marital Status: Married    Spouse Name: N/A  . Number of Children: 2  . Years of Education: N/A   Occupational History  . [employer]     no work lately   Social History Main Topics  . Smoking status: Never Smoker   . Smokeless tobacco: Current User    Types: Chew  . Alcohol Use: 0.0 oz/week    0 Standard drinks or equivalent per week     Comment: 4-6 drinks daily  . Drug Use: No  . Sexual Activity: Not on file   Other Topics Concern  . Not on file   Social History Narrative         Review of Systems, as per HPI, otherwise negative General:  No chills, fever, night sweats or weight changes.  Cardiovascular:  No chest pain, dyspnea on exertion, edema, orthopnea, palpitations, paroxysmal nocturnal dyspnea. Dermatological: No rash, lesions/masses Respiratory: No cough, dyspnea Urologic: No hematuria, dysuria Abdominal:   No nausea, vomiting, diarrhea, bright red blood per rectum, melena, or hematemesis Neurologic:  No visual changes, wkns, changes in mental status. All other systems reviewed and are otherwise negative except as noted above.  Physical Exam  Blood pressure 118/64, pulse 66, height 5' 6.75" (1.695 m), weight 268 lb (121.564 kg), SpO2 94 %.  General: Pleasant, NAD, obese Psych: Normal affect. Neuro: Alert and oriented X 3. Moves all extremities spontaneously. HEENT: Normal  Neck: Supple without bruits or JVD. Lungs:  Resp regular and unlabored, wheezes Heart: RRR no s3, s4, or murmurs. Abdomen: Soft, non-tender, non-distended,  BS + x 4.  Extremities: No clubbing, cyanosis, non-pitting edema at the ankles. DP/PT/Radials 2+ and equal bilaterally.  Labs:  No results for input(s): CKTOTAL, CKMB, TROPONINI in the last 72 hours. Lab Results  Component Value Date   WBC 2.6* 08/27/2014   HGB 12.7* 08/27/2014   HCT 36.3* 08/27/2014   MCV 100.8* 08/27/2014   PLT 33.0 Repeated and verified X2.* 08/27/2014   No results for input(s): NA, K, CL, CO2, BUN, CREATININE, CALCIUM, PROT, BILITOT, ALKPHOS, ALT, AST, GLUCOSE in the last 168 hours.  Invalid input(s): LABALBU Lab Results  Component Value Date   CHOL 209* 11/01/2013   HDL 38* 11/01/2013   LDLCALC 137* 11/01/2013   TRIG 172* 11/01/2013   Accessory Clinical Findings  Echocardiogram - none  ECG - normal sinus rhythm, 83 beats per minute, left posterior fascicular block, T-wave abnormalities with possible inferior ischemia. Abnormal EKG.  Left and  right cardiac cath: 06/10/13 Final Conclusions:  1. Normal LV function with elevated LVEDP  2. Normal coronary arteries  3. Elevated right heart pressures suspect secondary to diastolic dysfunction (transpulmonic gradient less than 10 mm Hg)  Recommendations: lifestyle modification, medical therapy for CHF  Sherren Mocha  06/10/2013, 2:19 PM   Assessment & Plan  A 49 year old male with  1. Exertional chest pain - abnormal ECG suggesting possible inferior ischemia, multiple risk factors, including obesity, HLP, HTN. He was considered a hight risk and he underwent a cath that was normal. LVEF normal, but elevated LVEDP.  2. Chronic diastolic CHF - NYHA II non-ischemic, preserved LVEF, elevated LVEDP. The patient is able to work full time with breaks. His major problem was fatigue and shortness of breath. He discontinued metoprolol and started him on amlodipine. Since the last visit in our office, the patient kept calling palpitations. We discontinued amlodipine and started carvedilol 12.5 mg twice a day.His  symptoms improved with Carvedilol, but he still feels shortness of breath and fatigue after working for about 4 hours.   He has lost weight but still higher than at the last visit, clear lungs, chronic LE edema, we will continue the same dose of lasix and spironolactone.   Encouraged to exercise, stay away from etoh, limit salt intake.   3. Hypertension - controlled   4. Hyperlipidemia - high TAG, possibly secondary to etoh abuse, elevated AST, we will consider Zetia, omega 3 acids   5. OSA - started on CPAP in January 2015  6. Etoh abuse - finally quit  7. Obesity - didn't go to cardiac rehab, advised on exercise.   83. Family h/o ruptured AAA in multiple family members - [name] negative  9. Neuropathic pain - IGT - normal HbA1c, we will start gabapentin 300 mg po TID, good pulses.  Follow up in 6 months with comprehensive metabolic profile.  Dorothy Spark, MD, [facility] 10/22/2014, 9:17 AM

## 2014-10-22 ENCOUNTER — Other Ambulatory Visit (INDEPENDENT_AMBULATORY_CARE_PROVIDER_SITE_OTHER): Payer: BLUE CROSS/BLUE SHIELD | Admitting: *Deleted

## 2014-10-22 ENCOUNTER — Ambulatory Visit (INDEPENDENT_AMBULATORY_CARE_PROVIDER_SITE_OTHER): Payer: BLUE CROSS/BLUE SHIELD | Admitting: Cardiology

## 2014-10-22 ENCOUNTER — Encounter: Payer: Self-pay | Admitting: Cardiology

## 2014-10-22 VITALS — BP 118/64 | HR 66 | Ht 66.75 in | Wt 268.0 lb

## 2014-10-22 DIAGNOSIS — I5031 Acute diastolic (congestive) heart failure: Secondary | ICD-10-CM

## 2014-10-22 DIAGNOSIS — I5032 Chronic diastolic (congestive) heart failure: Secondary | ICD-10-CM | POA: Diagnosis not present

## 2014-10-22 DIAGNOSIS — I10 Essential (primary) hypertension: Secondary | ICD-10-CM | POA: Diagnosis not present

## 2014-10-22 DIAGNOSIS — R0609 Other forms of dyspnea: Secondary | ICD-10-CM | POA: Diagnosis not present

## 2014-10-22 DIAGNOSIS — R0600 Dyspnea, unspecified: Secondary | ICD-10-CM

## 2014-10-22 LAB — BASIC METABOLIC PANEL
BUN: 12 mg/dL (ref 6–23)
CO2: 27 mEq/L (ref 19–32)
Calcium: 8.3 mg/dL — ABNORMAL LOW (ref 8.4–10.5)
Chloride: 104 mEq/L (ref 96–112)
Creatinine, Ser: 1.34 mg/dL (ref 0.40–1.50)
GFR: 60.44 mL/min (ref >60.00)
Glucose, Bld: 91 mg/dL (ref 70–99)
Potassium: 3.5 mEq/L (ref 3.5–5.1)
Sodium: 134 mEq/L — ABNORMAL LOW (ref 135–145)

## 2014-10-22 NOTE — Patient Instructions (Signed)
Your physician recommends that you continue on your current medications as directed. Please refer to the Current Medication list given to you today.     Your physician wants you to follow-up in: 6 MONTHS WITH DR NELSON You will receive a reminder letter in the mail two months in advance. If you don't receive a letter, please call our office to schedule the follow-up appointment.  

## 2014-11-06 ENCOUNTER — Encounter: Payer: Self-pay | Admitting: Family Medicine

## 2014-11-06 NOTE — Telephone Encounter (Signed)
Please see Mychart message from patient.  

## 2014-11-09 NOTE — Telephone Encounter (Signed)
Plz notify letter for jury duty prepared and ready (in chart).

## 2014-11-11 ENCOUNTER — Encounter: Payer: BC Managed Care – PPO | Admitting: Internal Medicine

## 2014-11-11 ENCOUNTER — Ambulatory Visit: Payer: BLUE CROSS/BLUE SHIELD | Admitting: Gastroenterology

## 2014-11-14 ENCOUNTER — Telehealth: Payer: Self-pay

## 2014-11-14 NOTE — Telephone Encounter (Signed)
Pt left v/m; pt is very sleepy and tired during the day; pt wonders if symptoms could be related to pts medications. pt works in [employer] working and uses Administrator, sports.pt request cb. Pt last seen 10/01/14 and has 3 month f/u appt 12/31/14.

## 2014-11-15 NOTE — Telephone Encounter (Signed)
He's not on any med that should cause fatigue.  If worsening issue, would probably suggest office eval - may need rpt labwork to eval for other causes of fatigue and recheck kidney function.

## 2014-11-17 NOTE — Telephone Encounter (Signed)
Appointment scheduled 11/18/14.

## 2014-11-18 ENCOUNTER — Telehealth: Payer: Self-pay | Admitting: *Deleted

## 2014-11-18 ENCOUNTER — Encounter: Payer: Self-pay | Admitting: Family Medicine

## 2014-11-18 ENCOUNTER — Ambulatory Visit (INDEPENDENT_AMBULATORY_CARE_PROVIDER_SITE_OTHER): Payer: BLUE CROSS/BLUE SHIELD | Admitting: Family Medicine

## 2014-11-18 VITALS — BP 122/62 | HR 71 | Temp 98.1°F | Wt 271.0 lb

## 2014-11-18 DIAGNOSIS — R1011 Right upper quadrant pain: Secondary | ICD-10-CM

## 2014-11-18 DIAGNOSIS — R5383 Other fatigue: Secondary | ICD-10-CM | POA: Insufficient documentation

## 2014-11-18 LAB — HEPATIC FUNCTION PANEL
ALBUMIN: 2.1 g/dL — AB (ref 3.5–5.2)
ALT: 24 U/L (ref 0–53)
AST: 51 U/L — ABNORMAL HIGH (ref 0–37)
Alkaline Phosphatase: 203 U/L — ABNORMAL HIGH (ref 39–117)
Bilirubin, Direct: 0.7 mg/dL — ABNORMAL HIGH (ref 0.0–0.3)
TOTAL PROTEIN: 7.2 g/dL (ref 6.0–8.3)
Total Bilirubin: 2 mg/dL — ABNORMAL HIGH (ref 0.2–1.2)

## 2014-11-18 LAB — VITAMIN D 25 HYDROXY (VIT D DEFICIENCY, FRACTURES): VITD: 29.34 ng/mL — ABNORMAL LOW (ref 30.00–100.00)

## 2014-11-18 LAB — BASIC METABOLIC PANEL
BUN: 9 mg/dL (ref 6–23)
CALCIUM: 8.2 mg/dL — AB (ref 8.4–10.5)
CO2: 28 meq/L (ref 19–32)
Chloride: 104 mEq/L (ref 96–112)
Creatinine, Ser: 0.92 mg/dL (ref 0.40–1.50)
GFR: 93.26 mL/min (ref >60.00)
Glucose, Bld: 91 mg/dL (ref 70–99)
POTASSIUM: 3.6 meq/L (ref 3.5–5.1)
Sodium: 134 mEq/L — ABNORMAL LOW (ref 135–145)

## 2014-11-18 LAB — CBC WITH DIFFERENTIAL/PLATELET
BASOS PCT: 0.9 % (ref 0.0–3.0)
Basophils Absolute: 0 10*3/uL (ref 0.0–0.1)
EOS PCT: 4.2 % (ref 0.0–5.0)
Eosinophils Absolute: 0.1 10*3/uL (ref 0.0–0.7)
HEMATOCRIT: 34.5 % — AB (ref 39.0–52.0)
HEMOGLOBIN: 12.1 g/dL — AB (ref 13.0–17.0)
LYMPHS ABS: 0.8 10*3/uL (ref 0.7–4.0)
Lymphocytes Relative: 30.5 % (ref 12.0–46.0)
MCHC: 34.9 g/dL (ref 30.0–36.0)
MCV: 100.7 fl — AB (ref 78.0–100.0)
Monocytes Absolute: 0.2 10*3/uL (ref 0.1–1.0)
Monocytes Relative: 8.1 % (ref 3.0–12.0)
NEUTROS PCT: 56.3 % (ref 43.0–77.0)
Neutro Abs: 1.4 10*3/uL (ref 1.4–7.7)
Platelets: 38 10*3/uL — CL (ref 150.0–400.0)
RBC: 3.42 Mil/uL — ABNORMAL LOW (ref 4.22–5.81)
RDW: 15.9 % — ABNORMAL HIGH (ref 11.5–15.5)
WBC: 2.6 10*3/uL — ABNORMAL LOW (ref 4.0–10.5)

## 2014-11-18 LAB — VITAMIN B12: Vitamin B-12: 734 pg/mL (ref 211–911)

## 2014-11-18 NOTE — Progress Notes (Signed)
Pre visit review using our clinic review tool, if applicable. No additional management support is needed unless otherwise documented below in the visit note. 

## 2014-11-18 NOTE — Telephone Encounter (Signed)
Critical lab- platelet count 38.0- Dr. Darnell Level verbally informed.

## 2014-11-18 NOTE — Assessment & Plan Note (Signed)
Worse since starting xifaxan. Check for reversible causes of fatigue today (CBC, B12, TSH, vit D, CMP). If unrevealing eval, rec discuss xifaxan with GI.

## 2014-11-18 NOTE — Progress Notes (Signed)
BP 122/62 mmHg  Pulse 71  Temp(Src) 98.1 F (36.7 C) (Oral)  Wt 271 lb (122.925 kg)  SpO2 95%   CC: fatigue  Subjective:    Patient ID: [name], male    DOB: April 02, 1966, 49 y.o.   MRN: [id number]  HPI: HELMUT HENNON is a 49 y.o. male presenting on 11/18/2014 for Fatigue   Known alcoholic cirrhosis with splenomegaly, thrombocytopenia (actually pancytopenia), and hypoalbuminemia. Endorses 1 mo h/o fatigue worse since starting xifaxan, and fatigue is listed as a possible side effect (12%). Describes daytime somnolence, fatigue. Some nausea and dizziness. 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 Known OSA and compliant with CPAP. Also endorses R sided pain.   Here for eval for reversible causes of fatigue.   Relevant past medical, surgical, family and social history reviewed and updated as indicated. Interim medical history since our last visit reviewed. Allergies and medications reviewed and updated. Current Outpatient Prescriptions on File Prior to Visit  Medication Sig  . ALPRAZolam (XANAX) 0.5 MG tablet Take 1 tablet (0.5 mg total) by mouth 2 (two) times daily as needed for anxiety.  . carvedilol (COREG) 12.5 MG tablet Take 18.75 mg by mouth 2 (two) times daily with a meal.  . doxazosin (CARDURA) 4 MG tablet Take 4 mg by mouth daily.  . furosemide (LASIX) 40 MG tablet Take 1 tablet (40 mg total) by mouth daily.  Marland Kitchen gabapentin (NEURONTIN) 300 MG capsule Take 1 capsule (300 mg total) by mouth 3 (three) times daily.  . hydrocortisone (ANUSOL-HC) 2.5 % rectal cream Place 1 application rectally 2 (two) times daily.  Marland Kitchen ibuprofen (ADVIL,MOTRIN) 200 MG tablet Take 400 mg by mouth every 6 (six) hours as needed for moderate pain.  Marland Kitchen omeprazole (PRILOSEC) 20 MG capsule Take 20 mg by mouth daily.  . rifaximin (XIFAXAN) 550 MG TABS tablet Take 1 tablet (550 mg total) by mouth 2 (two) times daily.  . sildenafil (VIAGRA) 100 MG tablet Take 0.5-1 tablets (50-100 mg total) by mouth  daily as needed for erectile dysfunction.  Marland Kitchen spironolactone (ALDACTONE) 50 MG tablet Take 1 tablet (50 mg total) by mouth 2 (two) times daily.  . Tetrahydrozoline HCl (VISINE OP) Place 1 drop into both eyes daily.   No current facility-administered medications on file prior to visit.    Review of Systems Per HPI unless specifically indicated above     Objective:    BP 122/62 mmHg  Pulse 71  Temp(Src) 98.1 F (36.7 C) (Oral)  Wt 271 lb (122.925 kg)  SpO2 95%  Wt Readings from Last 3 Encounters:  11/18/14 271 lb (122.925 kg)  10/22/14 268 lb (121.564 kg)  10/01/14 268 lb 8 oz (121.791 kg)    Physical Exam  Constitutional: He appears well-developed and well-nourished. No distress.  HENT:  Mouth/Throat: Oropharynx is clear and moist. No oropharyngeal exudate.  Cardiovascular: Normal rate, regular rhythm, normal heart sounds and intact distal pulses.   No murmur heard. Pulmonary/Chest: Effort normal and breath sounds normal. No respiratory distress. He has no wheezes. He has no rales.  Abdominal: Soft. Bowel sounds are normal. He exhibits distension. He exhibits no mass. There is no hepatosplenomegaly. There is no tenderness. There is no rebound, no guarding and no CVA tenderness.  Musculoskeletal: He exhibits no edema (nonpitting).  Skin: Skin is warm and dry. No rash noted.  Psychiatric: He has a normal mood and affect.  Nursing note and vitals reviewed.  Lab Results  Component Value Date  TSH 1.13 08/27/2014       Assessment & Plan:   Problem List Items Addressed This Visit    Other fatigue - Primary    Worse since starting xifaxan. Check for reversible causes of fatigue today (CBC, B12, TSH, vit D, CMP). If unrevealing eval, rec discuss xifaxan with GI.      Relevant Orders   Basic metabolic panel   Vitamin P18   Vit D  25 hydroxy (rtn osteoporosis monitoring)    Other Visit Diagnoses    Abdominal pain, right upper quadrant        Relevant Orders    CBC with  Differential/Platelet    Hepatic function panel        Follow up plan: Return if symptoms worsen or fail to improve.

## 2014-11-18 NOTE — Telephone Encounter (Signed)
Noted. Chronic.  

## 2014-11-18 NOTE — Patient Instructions (Signed)
Blood work today for reversible causes of fatigue. We will call you with results, but may be due to xifaxan.

## 2014-11-21 ENCOUNTER — Emergency Department (HOSPITAL_COMMUNITY): Payer: BLUE CROSS/BLUE SHIELD

## 2014-11-21 ENCOUNTER — Telehealth: Payer: Self-pay | Admitting: Family Medicine

## 2014-11-21 ENCOUNTER — Encounter (HOSPITAL_COMMUNITY): Payer: Self-pay | Admitting: *Deleted

## 2014-11-21 ENCOUNTER — Encounter: Payer: Self-pay | Admitting: Family Medicine

## 2014-11-21 ENCOUNTER — Ambulatory Visit: Payer: Self-pay | Admitting: Family Medicine

## 2014-11-21 ENCOUNTER — Encounter: Payer: Self-pay | Admitting: Primary Care

## 2014-11-21 ENCOUNTER — Ambulatory Visit (INDEPENDENT_AMBULATORY_CARE_PROVIDER_SITE_OTHER): Payer: BLUE CROSS/BLUE SHIELD | Admitting: Primary Care

## 2014-11-21 ENCOUNTER — Inpatient Hospital Stay (HOSPITAL_COMMUNITY)
Admission: EM | Admit: 2014-11-21 | Discharge: 2014-11-29 | DRG: 315 | Disposition: A | Discharge status: Home / Self Care | Payer: BLUE CROSS/BLUE SHIELD | Attending: Internal Medicine | Admitting: Internal Medicine

## 2014-11-21 VITALS — BP 96/58 | HR 88 | Temp 99.3°F

## 2014-11-21 DIAGNOSIS — G629 Polyneuropathy, unspecified: Secondary | ICD-10-CM | POA: Diagnosis present

## 2014-11-21 DIAGNOSIS — K219 Gastro-esophageal reflux disease without esophagitis: Secondary | ICD-10-CM | POA: Diagnosis present

## 2014-11-21 DIAGNOSIS — E869 Volume depletion, unspecified: Secondary | ICD-10-CM | POA: Diagnosis present

## 2014-11-21 DIAGNOSIS — I10 Essential (primary) hypertension: Secondary | ICD-10-CM | POA: Diagnosis present

## 2014-11-21 DIAGNOSIS — F1020 Alcohol dependence, uncomplicated: Secondary | ICD-10-CM | POA: Diagnosis present

## 2014-11-21 DIAGNOSIS — F17220 Nicotine dependence, chewing tobacco, uncomplicated: Secondary | ICD-10-CM | POA: Diagnosis present

## 2014-11-21 DIAGNOSIS — R509 Fever, unspecified: Secondary | ICD-10-CM

## 2014-11-21 DIAGNOSIS — K7290 Hepatic failure, unspecified without coma: Secondary | ICD-10-CM | POA: Diagnosis present

## 2014-11-21 DIAGNOSIS — D61818 Other pancytopenia: Secondary | ICD-10-CM | POA: Diagnosis present

## 2014-11-21 DIAGNOSIS — K7030 Alcoholic cirrhosis of liver without ascites: Secondary | ICD-10-CM | POA: Diagnosis present

## 2014-11-21 DIAGNOSIS — R161 Splenomegaly, not elsewhere classified: Secondary | ICD-10-CM | POA: Diagnosis present

## 2014-11-21 DIAGNOSIS — I5042 Chronic combined systolic (congestive) and diastolic (congestive) heart failure: Secondary | ICD-10-CM | POA: Diagnosis present

## 2014-11-21 DIAGNOSIS — I5032 Chronic diastolic (congestive) heart failure: Secondary | ICD-10-CM | POA: Diagnosis not present

## 2014-11-21 DIAGNOSIS — N390 Urinary tract infection, site not specified: Secondary | ICD-10-CM | POA: Diagnosis present

## 2014-11-21 DIAGNOSIS — Z888 Allergy status to other drugs, medicaments and biological substances status: Secondary | ICD-10-CM

## 2014-11-21 DIAGNOSIS — I9589 Other hypotension: Secondary | ICD-10-CM | POA: Diagnosis not present

## 2014-11-21 DIAGNOSIS — N179 Acute kidney failure, unspecified: Secondary | ICD-10-CM | POA: Diagnosis present

## 2014-11-21 DIAGNOSIS — K7682 Hepatic encephalopathy: Secondary | ICD-10-CM | POA: Diagnosis present

## 2014-11-21 DIAGNOSIS — D619 Aplastic anemia, unspecified: Secondary | ICD-10-CM | POA: Diagnosis not present

## 2014-11-21 DIAGNOSIS — R6510 Systemic inflammatory response syndrome (SIRS) of non-infectious origin without acute organ dysfunction: Secondary | ICD-10-CM | POA: Diagnosis present

## 2014-11-21 DIAGNOSIS — R5383 Other fatigue: Secondary | ICD-10-CM

## 2014-11-21 DIAGNOSIS — Z6841 Body Mass Index (BMI) 40.0 and over, adult: Secondary | ICD-10-CM

## 2014-11-21 DIAGNOSIS — A419 Sepsis, unspecified organism: Secondary | ICD-10-CM | POA: Insufficient documentation

## 2014-11-21 DIAGNOSIS — L03115 Cellulitis of right lower limb: Secondary | ICD-10-CM

## 2014-11-21 DIAGNOSIS — I509 Heart failure, unspecified: Secondary | ICD-10-CM | POA: Diagnosis not present

## 2014-11-21 DIAGNOSIS — I5030 Unspecified diastolic (congestive) heart failure: Secondary | ICD-10-CM | POA: Diagnosis present

## 2014-11-21 DIAGNOSIS — I959 Hypotension, unspecified: Secondary | ICD-10-CM | POA: Diagnosis present

## 2014-11-21 DIAGNOSIS — E876 Hypokalemia: Secondary | ICD-10-CM | POA: Diagnosis present

## 2014-11-21 DIAGNOSIS — Z8719 Personal history of other diseases of the digestive system: Secondary | ICD-10-CM | POA: Diagnosis present

## 2014-11-21 DIAGNOSIS — E871 Hypo-osmolality and hyponatremia: Secondary | ICD-10-CM | POA: Diagnosis present

## 2014-11-21 DIAGNOSIS — K7031 Alcoholic cirrhosis of liver with ascites: Secondary | ICD-10-CM | POA: Diagnosis present

## 2014-11-21 DIAGNOSIS — M791 Myalgia: Secondary | ICD-10-CM | POA: Diagnosis present

## 2014-11-21 DIAGNOSIS — L03119 Cellulitis of unspecified part of limb: Secondary | ICD-10-CM | POA: Diagnosis present

## 2014-11-21 LAB — CBC WITH DIFFERENTIAL/PLATELET
BAND NEUTROPHILS: 0 % (ref 0–10)
BASOS ABS: 0 10*3/uL (ref 0.0–0.1)
Basophils Relative: 0 % (ref 0–1)
Blasts: 0 %
Eosinophils Absolute: 0.1 10*3/uL (ref 0.0–0.7)
Eosinophils Relative: 1 % (ref 0–5)
HCT: 33.2 % — ABNORMAL LOW (ref 39.0–52.0)
HEMOGLOBIN: 11.9 g/dL — AB (ref 13.0–17.0)
LYMPHS ABS: 0.5 10*3/uL — AB (ref 0.7–4.0)
Lymphocytes Relative: 7 % — ABNORMAL LOW (ref 12–46)
MCH: 35.1 pg — AB (ref 26.0–34.0)
MCHC: 35.8 g/dL (ref 30.0–36.0)
MCV: 97.9 fL (ref 78.0–100.0)
MONO ABS: 0.6 10*3/uL (ref 0.1–1.0)
MYELOCYTES: 0 %
Metamyelocytes Relative: 0 %
Monocytes Relative: 9 % (ref 3–12)
NEUTROS ABS: 5.5 10*3/uL (ref 1.7–7.7)
Neutrophils Relative %: 83 % — ABNORMAL HIGH (ref 43–77)
PROMYELOCYTES ABS: 0 %
Platelets: 33 10*3/uL — ABNORMAL LOW (ref 150–400)
RBC: 3.39 MIL/uL — ABNORMAL LOW (ref 4.22–5.81)
RDW: 15.1 % (ref 11.5–15.5)
WBC: 6.7 10*3/uL (ref 4.0–10.5)
nRBC: 0 /100 WBC

## 2014-11-21 LAB — URINALYSIS, ROUTINE W REFLEX MICROSCOPIC
Glucose, UA: NEGATIVE mg/dL
Ketones, ur: 15 mg/dL — AB
NITRITE: POSITIVE — AB
PH: 5 (ref 5.0–8.0)
PROTEIN: NEGATIVE mg/dL
Specific Gravity, Urine: 1.016 (ref 1.005–1.030)
Urobilinogen, UA: 1 mg/dL (ref 0.0–1.0)

## 2014-11-21 LAB — URINE MICROSCOPIC-ADD ON

## 2014-11-21 LAB — COMPREHENSIVE METABOLIC PANEL
ALT: 25 U/L (ref 17–63)
AST: 55 U/L — AB (ref 15–41)
Albumin: 1.8 g/dL — ABNORMAL LOW (ref 3.5–5.0)
Alkaline Phosphatase: 104 U/L (ref 38–126)
Anion gap: 8 (ref 5–15)
BUN: 12 mg/dL (ref 6–20)
CHLORIDE: 102 mmol/L (ref 101–111)
CO2: 23 mmol/L (ref 22–32)
Calcium: 8 mg/dL — ABNORMAL LOW (ref 8.9–10.3)
Creatinine, Ser: 1.32 mg/dL — ABNORMAL HIGH (ref 0.61–1.24)
GFR calc Af Amer: >60 mL/min (ref >60)
Glucose, Bld: 102 mg/dL — ABNORMAL HIGH (ref 65–99)
POTASSIUM: 3.7 mmol/L (ref 3.5–5.1)
Sodium: 133 mmol/L — ABNORMAL LOW (ref 135–145)
Total Bilirubin: 3.9 mg/dL — ABNORMAL HIGH (ref 0.3–1.2)
Total Protein: 6.9 g/dL (ref 6.5–8.1)

## 2014-11-21 LAB — CREATININE, URINE, RANDOM: CREATININE, URINE: 132.08 mg/dL

## 2014-11-21 LAB — I-STAT CG4 LACTIC ACID, ED: Lactic Acid, Venous: 1.97 mmol/L (ref 0.5–2.0)

## 2014-11-21 LAB — SODIUM, URINE, RANDOM

## 2014-11-21 MED ORDER — IBUPROFEN 400 MG PO TABS
ORAL_TABLET | ORAL | Status: AC
  Filled 2014-11-21: qty 1

## 2014-11-21 MED ORDER — SODIUM CHLORIDE 0.9 % IV SOLN
INTRAVENOUS | Status: DC
  Administered 2014-11-21 – 2014-11-23 (×2): via INTRAVENOUS

## 2014-11-21 MED ORDER — PANTOPRAZOLE SODIUM 40 MG PO TBEC
40.0000 mg | DELAYED_RELEASE_TABLET | Freq: Every day | ORAL | Status: DC
  Administered 2014-11-22 – 2014-11-29 (×8): 40 mg via ORAL
  Filled 2014-11-21 (×8): qty 1

## 2014-11-21 MED ORDER — ALPRAZOLAM 0.5 MG PO TABS
0.5000 mg | ORAL_TABLET | Freq: Two times a day (BID) | ORAL | Status: DC | PRN
  Administered 2014-11-21 – 2014-11-29 (×6): 0.5 mg via ORAL
  Filled 2014-11-21 (×6): qty 1

## 2014-11-21 MED ORDER — SODIUM CHLORIDE 0.9 % IV BOLUS (SEPSIS)
1000.0000 mL | Freq: Once | INTRAVENOUS | Status: AC
  Administered 2014-11-21: 1000 mL via INTRAVENOUS

## 2014-11-21 MED ORDER — RIFAXIMIN 550 MG PO TABS
550.0000 mg | ORAL_TABLET | Freq: Two times a day (BID) | ORAL | Status: DC
  Administered 2014-11-21 – 2014-11-23 (×4): 550 mg via ORAL
  Filled 2014-11-21 (×7): qty 1

## 2014-11-21 MED ORDER — DEXTROSE 5 % IV SOLN
2.0000 g | INTRAVENOUS | Status: DC
  Administered 2014-11-21 – 2014-11-22 (×2): 2 g via INTRAVENOUS
  Filled 2014-11-21 (×3): qty 2

## 2014-11-21 MED ORDER — IBUPROFEN 400 MG PO TABS
400.0000 mg | ORAL_TABLET | Freq: Once | ORAL | Status: AC
  Administered 2014-11-21: 400 mg via ORAL

## 2014-11-21 MED ORDER — SODIUM CHLORIDE 0.9 % IJ SOLN
3.0000 mL | Freq: Two times a day (BID) | INTRAMUSCULAR | Status: DC
  Administered 2014-11-21 – 2014-11-26 (×7): 3 mL via INTRAVENOUS

## 2014-11-21 MED ORDER — VANCOMYCIN HCL 10 G IV SOLR
2000.0000 mg | Freq: Once | INTRAVENOUS | Status: AC
  Administered 2014-11-21: 2000 mg via INTRAVENOUS
  Filled 2014-11-21: qty 2000

## 2014-11-21 MED ORDER — VANCOMYCIN HCL IN DEXTROSE 1-5 GM/200ML-% IV SOLN
1000.0000 mg | Freq: Two times a day (BID) | INTRAVENOUS | Status: DC
  Administered 2014-11-22 – 2014-11-23 (×3): 1000 mg via INTRAVENOUS
  Filled 2014-11-21 (×4): qty 200

## 2014-11-21 MED ORDER — ONDANSETRON HCL 4 MG PO TABS: 4.0000 mg | ORAL_TABLET | Freq: Four times a day (QID) | ORAL | Status: DC | PRN

## 2014-11-21 MED ORDER — IBUPROFEN 200 MG PO TABS
400.0000 mg | ORAL_TABLET | Freq: Four times a day (QID) | ORAL | Status: DC | PRN
  Administered 2014-11-21 – 2014-11-22 (×3): 400 mg via ORAL
  Filled 2014-11-21 (×3): qty 2

## 2014-11-21 MED ORDER — VANCOMYCIN HCL IN DEXTROSE 1-5 GM/200ML-% IV SOLN: 1000.0000 mg | Freq: Once | INTRAVENOUS | Status: DC

## 2014-11-21 MED ORDER — HEPARIN SODIUM (PORCINE) 5000 UNIT/ML IJ SOLN
5000.0000 [IU] | Freq: Three times a day (TID) | INTRAMUSCULAR | Status: DC
  Administered 2014-11-21: 5000 [IU] via SUBCUTANEOUS
  Filled 2014-11-21 (×3): qty 1

## 2014-11-21 MED ORDER — ONDANSETRON HCL 4 MG/2ML IJ SOLN: 4.0000 mg | Freq: Four times a day (QID) | INTRAMUSCULAR | Status: DC | PRN

## 2014-11-21 MED ORDER — CEFTRIAXONE SODIUM IN DEXTROSE 20 MG/ML IV SOLN: 1.0000 g | INTRAVENOUS | Status: DC

## 2014-11-21 NOTE — Progress Notes (Signed)
ANTIBIOTIC CONSULT NOTE - INITIAL  Pharmacy Consult:  Vancomycin / Rocephin Indication:  Cellulitis + UTI   Allergies  Allergen Reactions  . Hydrochlorothiazide W-Triamterene Other (See Comments)    REACTION: dizzy, nausea  . Lisinopril Other (See Comments)    REACTION: cough, decreased libido    Patient Measurements: Height: 5\' 7"  (170.2 cm) Weight: 271 lb (122.925 kg) IBW/kg (Calculated) : 66.1  Vital Signs: Temp: 99.1 F (37.3 C) (05/27 1828) Temp Source: Rectal (05/27 1828) BP: 101/40 mmHg (05/27 1830) Pulse Rate: 81 (05/27 1830)  Labs:  Recent Labs  11/21/14 1430  WBC 6.7  HGB 11.9*  PLT 33*  CREATININE 1.32*   Estimated Creatinine Clearance: 86 mL/min (by C-G formula based on Cr of 1.32). No results for input(s): VANCOTROUGH, VANCOPEAK, VANCORANDOM, GENTTROUGH, GENTPEAK, GENTRANDOM, TOBRATROUGH, TOBRAPEAK, TOBRARND, AMIKACINPEAK, AMIKACINTROU, AMIKACIN in the last 72 hours.   Microbiology: No results found for this or any previous visit (from the past 720 hour(s)).  Medical History: Past Medical History  Diagnosis Date  . Allergy   . GERD (gastroesophageal reflux disease)   . Hyperlipidemia   . Hypertension   . Neuropathy   . Thrombocytopenia 12/15/2011  . Chronic diastolic heart failure 09/01/9022  . Alcohol dependence 07/11/2012  . Alcoholic cirrhosis of liver with ascites 07/2014  . OSA on CPAP 07/11/2012    HST 07/2013:  AHI 39/hr.        Assessment: 71 YOM presented with myalgias and rigors.  Pharmacy consulted to initiate vancomycin and ceftriaxone for LE cellulitis and UTI.  Noted patient with AKI.   Goal of Therapy:  Vancomycin trough level 15-20 mcg/ml   Plan:  - Vanc 2gm IV x 1, then 1gm IV Q12H - CTX 2gm IV Q24H - Monitor renal fxn, clinical course, vanc trough as indicated    Schneur Crowson D. Mina Marble, PharmD, BCPS Pager:  [phone] 11/21/2014, 8:01 PM

## 2014-11-21 NOTE — Telephone Encounter (Signed)
[facility]  Patient Name: Colton Porter  DOB: Jan 16, 1966    Initial Comment Caller states c/o flu symptoms   Nurse Assessment  Nurse: Wynetta Emery, RN, Baker Janus Date/Time Eilene Ghazi Time): 11/21/2014 9:54:04 AM  Confirm and document reason for call. If symptomatic, describe symptoms. ---[name] has body aches, joints hurt, headache cough, toes and fingers are numb and nauseated onset yesterday  Has the patient traveled out of the country within the last 30 days? ---No  Does the patient require triage? ---Yes  Related visit to physician within the last 2 weeks? ---No  Does the PT have any chronic conditions? (i.e. diabetes, asthma, etc.) ---No     Guidelines    Guideline Title Affirmed Question Affirmed Notes  Influenza - Seasonal [1] Probable influenza (fever) with no complications AND [0] NOT HIGH RISK (all triage questions negative)    Final Disposition User   See Physician within 4 Hours (or PCP triage) Wynetta Emery, RN, Baker Janus    Comments  11-21-2014 400pm Amy [place] c/o flu like s/sx

## 2014-11-21 NOTE — ED Notes (Addendum)
Pt reports he has been "hurting all the time and staying cold," pt reports he has cirrhosis and was started on a new medicine for about 1 month. C/o generalized swelling, headaches, and pain in joints and muscles but denies any chest pain or sob. Pt alert and oriented, nad.

## 2014-11-21 NOTE — Telephone Encounter (Signed)
[facility]  Patient Name: Colton Porter  Gender: Male  DOB: 1966/01/11   Age: 49 Y 2 M 7 D  Return Phone Number: [phone] (Primary)  Address:   City/State/Zip: [place]    Client [facility] - Client  Client Site [facility]  Contact Type Call  Call Type Triage / Clinical  Caller Name Suanne Marker  Relationship To Patient Spouse  Return Phone Number [phone] (Primary)  Chief Complaint POISON - swallowed foreign body, pills or other dangerous substance  Initial Comment Caller states her husband has flu like symptoms, he has some medication added to him, may have been taking to much, he also has been bit by something. Shaking and out of it.  [place] Not Listed transfered to office he is worse and not wanting towait till appointment time.   PreDisposition Call Doctor   Nurse Assessment  Nurse: Buck Mam, RN, Trish Date/Time Eilene Ghazi Time): 11/21/2014 12:16:13 PM  Confirm and document reason for call. If symptomatic, describe symptoms. ---Wife is calling for patient and caller states her husband has flu like symptoms, he has some medication added to him, may have been taking to much, he also has been bit by something. Shaking and out of it. headache body aches. Hard to walk. ziflaxin for liver siralactone. may have taken to much for over a month.  Has the patient traveled out of the country within the last 30 days? ---No  Does the patient require triage? ---Yes  Related visit to physician within the last 2 weeks? ---Yes   was there this week for blood work  Does the PT have any chronic conditions? (i.e. diabetes, asthma, etc.) ---Yes  List chronic conditions. ---cirrhosis     Guidelines      Guideline Title Affirmed Question Affirmed Notes Nurse Date/Time (Eastern Time)  Poisoning Patient sounds very sick or weak to the triager  Buck Mam,  Therapist, sports, Trish 11/21/2014 12:21:11 PM   Disp. Time Eilene Ghazi Time) Disposition Final User   11/21/2014 12:13:52 PM Send to Urgent Queue  Baruch Goldmann    11/21/2014 12:29:19 PM Go to ED Now Yes Buck Mam, RN, Particia Lather Understands: Yes  Disagree/Comply: Disagree  Disagree/Comply Reason: Disagree with instructions   Care Advice Given Per Guideline      GO TO ED NOW: You need to be seen in the Emergency Department. Go to the ER at ___________ [place] now. Drive carefully. CARE ADVICE given per Poisoning (Adult) guideline.   After Care Instructions Given     Call Event Type User Date / Time Description        Referrals  GO TO FACILITY REFUSED

## 2014-11-21 NOTE — Telephone Encounter (Signed)
Gilmore Laroche nurse with team health said pt called back and pt has been shaking and feeling worse and Gilmore Laroche advised pt should go to ED but pt refuses to go to ED but will come for appt sooner than 4 PM at [facility]. Spoke with pt;  Back and joints are hurting badly; pt was shaking last night but when got warm the shaking stopped and pt is not shaking now. No CP,SOB or dizziness. Pt scheduled to see Allie Bossier NP today at 1 pm. If condition changes or worsens prior to appt pt will go to UC>

## 2014-11-21 NOTE — Telephone Encounter (Signed)
See phone note when team health transferred call to [facility] and pt has appt today with Allie Bossier NP at [facility].

## 2014-11-21 NOTE — Telephone Encounter (Signed)
Pt has appt 11/21/14 at 4 PM with Dr Diona Browner.

## 2014-11-21 NOTE — ED Provider Notes (Signed)
CSN: [id number]     Arrival date & time 11/21/14  1417 History   First MD Initiated Contact with Patient 11/21/14 1549     Chief Complaint  Patient presents with  . Hypotension  . Fever     (Consider location/radiation/quality/duration/timing/severity/associated sxs/prior Treatment) HPI  Pt is a 49yo male with hx of HTN, neuropathy, thrombocytopenia, chronic diastolic heart failure, alcohol dependence, alcoholic cirrhosis of liver with ascites, sent to ED from his PCP for further evaluation of worsening myalgias, with associated fever and hypotension.  Per note from PCP, pt has been having myalgias and chills for several months but suddenly worsened last night around 7PM when family notes he became very fatigued and had some confusion.  Per pt, he was started on Xifaxan 1 month ago for his cirrhosis and states that is when his myalgias started.  Pt also reports bilateral leg edema for several years, uncontrolled despite being on 2 "fluid pills," denies CP or SOB. Denies cough, congestion, abdominal pain, n/v/d. Denies known sick contacts or recent travel. Pt does question whether he was bit by an insect about 1 week ago, noting a rash to his Right lower back that has been itching and painful.  Denies any other rashes. Denies known tick bites.    Past Medical History  Diagnosis Date  . Allergy   . GERD (gastroesophageal reflux disease)   . Hyperlipidemia   . Hypertension   . Neuropathy   . Thrombocytopenia 12/15/2011  . Chronic diastolic heart failure 07/02/6061  . Alcohol dependence 07/11/2012  . Alcoholic cirrhosis of liver with ascites 07/2014  . OSA on CPAP 07/11/2012    HST 07/2013:  AHI 39/hr.     Past Surgical History  Procedure Laterality Date  . Hand surgery  1997  . Left and right heart catheterization with coronary angiogram N/A 06/10/2013    Procedure: LEFT AND RIGHT HEART CATHETERIZATION WITH CORONARY ANGIOGRAM;  Surgeon: Blane Ohara, MD;  Location: [facility] CATH LAB;  Service:  Cardiovascular;  Laterality: N/A;  . Colonoscopy  08/2013    hyperplastic polyps, hemorrhoids Ardis Hughs)  . 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 Stroke Mother   . Hypertension Father   . Heart disease Father   . Lung cancer Maternal Aunt   . Emphysema Mother   . Emphysema Father   . Heart attack Neg Hx   . Stroke Paternal Grandmother    History  Substance Use Topics  . Smoking status: Never Smoker   . Smokeless tobacco: Current User    Types: Chew  . Alcohol Use: 0.0 oz/week    0 Standard drinks or equivalent per week     Comment: 4-6 drinks daily    Review of Systems  Constitutional: Positive for fever, chills and fatigue. Negative for diaphoresis, appetite change and unexpected weight change.  Respiratory: Negative for cough and shortness of breath.   Cardiovascular: Positive for leg swelling (bilateral, chronic). Negative for chest pain and palpitations.  Gastrointestinal: Negative for nausea, vomiting, abdominal pain and diarrhea.  Genitourinary: Positive for flank pain (Right flank).  Musculoskeletal: Positive for myalgias, back pain and arthralgias.  Skin: Positive for rash. Negative for wound.  All other systems reviewed and are negative.     Allergies  Hydrochlorothiazide w-triamterene and Lisinopril  Home Medications   Prior to Admission medications   Medication Sig Start Date End Date Taking? Authorizing Provider  ALPRAZolam Duanne Moron) 0.5  MG tablet Take 1 tablet (0.5 mg total) by mouth 2 (two) times daily as needed for anxiety. 08/28/14  Yes Ria Bush, MD  carvedilol (COREG) 12.5 MG tablet Take 18.75 mg by mouth 2 (two) times daily with a meal.   Yes Historical Provider, MD  doxazosin (CARDURA) 4 MG tablet Take 4 mg by mouth daily.   Yes Historical Provider, MD  furosemide (LASIX) 40 MG tablet Take 1 tablet (40 mg total) by mouth daily. 09/04/14  Yes Ria Bush, MD  gabapentin (NEURONTIN) 300 MG capsule Take 1 capsule (300 mg total) by mouth 3 (three) times daily. 07/22/14  Yes Dorothy Spark, MD  ibuprofen (ADVIL,MOTRIN) 200 MG tablet Take 400 mg by mouth every 6 (six) hours as needed for moderate pain.   Yes Historical Provider, MD  omeprazole (PRILOSEC) 20 MG capsule Take 20 mg by mouth daily. 06/30/14  Yes Historical Provider, MD  rifaximin (XIFAXAN) 550 MG TABS tablet Take 1 tablet (550 mg total) by mouth 2 (two) times daily. 09/25/14  Yes Inda Castle, MD  sildenafil (VIAGRA) 100 MG tablet Take 0.5-1 tablets (50-100 mg total) by mouth daily as needed for erectile dysfunction. 10/01/14  Yes Ria Bush, MD  spironolactone (ALDACTONE) 50 MG tablet Take 1 tablet (50 mg total) by mouth 2 (two) times daily. 09/10/14  Yes Ria Bush, MD  Tetrahydrozoline HCl (VISINE OP) Place 1 drop into both eyes daily.   Yes Historical Provider, MD   BP 107/35 mmHg  Pulse 77  Temp(Src) 98.4 F (36.9 C) (Oral)  Resp 25  Ht 5\' 7"  (1.702 m)  Wt 270 lb 15.1 oz (122.9 kg)  BMI 42.43 kg/m2  SpO2 93% Physical Exam  Constitutional: He is oriented to person, place, and time. He appears well-developed and well-nourished.  HENT:  Head: Normocephalic and atraumatic.  Eyes: Conjunctivae are normal. No scleral icterus.  Neck: Normal range of motion. Neck supple.  Cardiovascular: Normal rate, regular rhythm and normal heart sounds.   Pulmonary/Chest: Effort normal and breath sounds normal. No respiratory distress. He has no wheezes. He has no rales. He exhibits no tenderness.  Abdominal: Soft. Bowel sounds are normal. He exhibits no distension and no mass. There is no tenderness. There is no rebound and no guarding.  Musculoskeletal: Normal range of motion. He exhibits edema and tenderness.  2-3+ pitting edema bilaterally up to tibial tuberosity. No calf tenderness.  Neurological: He is alert and oriented to person, place, and time. No cranial nerve  deficit.  Alert to person, place, and time.   Skin: Skin is warm and dry. Rash noted. There is erythema.     4cm area of erythema and drying skin lesion. No induration or fluctuance. No bleeding or discharge. Mild tenderness.  Distribution of rash c/w healing shingles.   Psychiatric: He has a normal mood and affect. His behavior is normal.  Nursing note and vitals reviewed.   ED Course  Procedures (including critical care time) Labs Review Labs Reviewed  CBC WITH DIFFERENTIAL/PLATELET - Abnormal; Notable for the following:    RBC 3.39 (*)    Hemoglobin 11.9 (*)    HCT 33.2 (*)    MCH 35.1 (*)    Platelets 33 (*)    Neutrophils Relative % 83 (*)    Lymphocytes Relative 7 (*)    Lymphs Abs 0.5 (*)    All other components within normal limits  COMPREHENSIVE METABOLIC PANEL - Abnormal; Notable for the following:    Sodium 133 (*)  Glucose, Bld 102 (*)    Creatinine, Ser 1.32 (*)    Calcium 8.0 (*)    Albumin 1.8 (*)    AST 55 (*)    Total Bilirubin 3.9 (*)    All other components within normal limits  URINALYSIS, ROUTINE W REFLEX MICROSCOPIC (NOT AT [facility]) - Abnormal; Notable for the following:    Color, Urine ORANGE (*)    APPearance TURBID (*)    Hgb urine dipstick LARGE (*)    Bilirubin Urine SMALL (*)    Ketones, ur 15 (*)    Nitrite POSITIVE (*)    Leukocytes, UA SMALL (*)    All other components within normal limits  URINE MICROSCOPIC-ADD ON - Abnormal; Notable for the following:    Bacteria, UA MANY (*)    Casts HYALINE CASTS (*)    All other components within normal limits  URINE CULTURE  CULTURE, BLOOD (ROUTINE X 2)  CULTURE, BLOOD (ROUTINE X 2)  URINE CULTURE  SODIUM, URINE, RANDOM  CREATININE, URINE, RANDOM  CBC  COMPREHENSIVE METABOLIC PANEL  I-STAT CG4 LACTIC ACID, ED    Imaging Review Dg Chest 2 View  11/21/2014   CLINICAL DATA:  Body aches for 2 days with fever. Chronic dry cough and shortness of breath with sinus congestion.  EXAM: CHEST  2 VIEW   COMPARISON:  03/31/2014 and 06/04/2013.  FINDINGS: The heart size and mediastinal contours are normal. The lungs are clear. There is no pleural effusion or pneumothorax. No acute osseous findings are identified.  IMPRESSION: Stable chest.  No active cardiopulmonary process.   Electronically Signed   By: Richardean Sale M.D.   On: 11/21/2014 15:14     EKG Interpretation None      MDM   Final diagnoses:  UTI (lower urinary tract infection)    Pt is a 49yo male sent to ED by his PCP for further evaluation for sudden worsening of myalgias with associated new onset fever and hypotension.  Reports of pt being on a new medication, Xifaxan, for 1 month for cirrhosis.  However, pt also became fatigued and confused last night with fever, likely not related to new medication.  Will workup for sepsis as Temp 101.9, BP 96/58.  Pt's normal systolic BP is 951O-841Y.  Pt does have hx of diastolic CHF, will be cautious with IV fluids as pt already has pedal edema. 1L IV fluids started.  CXR: unremakable Labs: slight increased in Cr, otherwise c/w previous. WBC-6.7, Lactic acid WNL-1.97 Blood culture and urine culture sent  6:03 PM UA significant for UTI with positive nitrites, only obvious source of infection to account for pt's fever and hypotension at this time. Discussed pt with Dr. Ralene Bathe who also examined pt, recommends having pt admitted to medicine due to pt's PMH of cirrhosis and vitals.  BP is low for pt.  2nd Liter of NS ordered.  IV Rocephin also ordered.   Pt admitted to med-surg bed as he is stable at this time.  Admitting physician will be Dr. Tyrell Antonio.    Noland Fordyce, PA-C 11/21/14 2149  Quintella Reichert, MD 11/21/14 [phone]

## 2014-11-21 NOTE — Telephone Encounter (Signed)
Noted  

## 2014-11-21 NOTE — Progress Notes (Signed)
Pre visit review using our clinic review tool, if applicable. No additional management support is needed unless otherwise documented below in the visit note. 

## 2014-11-21 NOTE — H&P (Signed)
[facility] History and Physical  RIKI GEHRING JXB:[id number] DOB: 03/23/1966 DOA: 11/21/2014  Referring physician: Dr. Ayesha Rumpf PCP: Ria Bush, MD   Chief Complaint: myalgias and rigors  HPI: Colton Porter is a 49 y.o. male with past medical history of hypertension, neuropathy, thrombocytopenia and cirrhosis due to alcohol abuse, chronic systolic heart failure that comes to the ED after being referred by his PCP. The patient is a poor historian and is always giving vague answers, his wife who is at bedside is also given vague and confusing answers. He relates he saw his primary care doctor on Monday started him on rifaximin and went back on the day of admission due to myalgias and chills which has been progressively getting worse. At the PCPs office he had a as per patient fever and hypotension. He relates bilateral leg swelling, no sick contacts, no nausea no diarrhea.  In the ED: He was found to be mildly hypotensive got 2 L of normal saline and his blood pressure came up to 101/40, he was mildly hyponatremic with a new rising creatinine 1.3 (baseline creatinine less than 1), his lactic acid was 1.7, and his white count was 6.7 (previous blood cell count were always less than 4).  Review of Systems:  Constitutional:  No weight loss, night sweats. HEENT:  No headaches, Difficulty swallowing,Tooth/dental problems,Sore throat,  No sneezing, itching, ear ache, nasal congestion, post nasal drip,  Cardio-vascular:  No chest pain, Orthopnea, PND, swelling in lower extremities, anasarca, dizziness, palpitations  GI:  No heartburn, indigestion, abdominal pain, nausea, vomiting, diarrhea, change in bowel habits, loss of appetite  Resp:  No shortness of breath with exertion or at rest. No excess mucus, no productive cough, No non-productive cough, No coughing up of blood.No change in color of mucus.No wheezing.No chest wall deformity  Skin:  no rash or lesions.  GU:  no  dysuria, change in color of urine, no urgency or frequency. No flank pain.  Musculoskeletal:  No joint pain or swelling. No decreased range of motion. No back pain.  Psych:  No change in mood or affect. No depression or anxiety. No memory loss.   Past Medical History  Diagnosis Date  . Allergy   . GERD (gastroesophageal reflux disease)   . Hyperlipidemia   . Hypertension   . Neuropathy   . Thrombocytopenia 12/15/2011  . Chronic diastolic heart failure 06/30/863  . Alcohol dependence 07/11/2012  . Alcoholic cirrhosis of liver with ascites 07/2014  . OSA on CPAP 07/11/2012    HST 07/2013:  AHI 39/hr.     Past Surgical History  Procedure Laterality Date  . Hand surgery  1997  . Left and right heart catheterization with coronary angiogram N/A 06/10/2013    Procedure: LEFT AND RIGHT HEART CATHETERIZATION WITH CORONARY ANGIOGRAM;  Surgeon: Blane Ohara, MD;  Location: [facility] CATH LAB;  Service: Cardiovascular;  Laterality: N/A;  . Colonoscopy  08/2013    hyperplastic polyps, hemorrhoids Ardis Hughs)  . 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 His smokeless tobacco use includes Chew. He reports that he drinks alcohol. He reports that he does not use illicit drugs.  Allergies  Allergen Reactions  . Hydrochlorothiazide W-Triamterene Other (See Comments)    REACTION: dizzy, nausea  . Lisinopril Other (See Comments)    REACTION: cough, decreased libido    Family History  Problem Relation Age of Onset  . Stroke Mother   .  Hypertension Father   . Heart disease Father   . Lung cancer Maternal Aunt   . Emphysema Mother   . Emphysema Father   . Heart attack Neg Hx   . Stroke Paternal Grandmother      Prior to Admission medications   Medication Sig Start Date End Date Taking? Authorizing Provider  ALPRAZolam Duanne Moron) 0.5 MG tablet Take 1 tablet (0.5 mg total) by mouth 2 (two) times daily as needed  for anxiety. 08/28/14   Ria Bush, MD  carvedilol (COREG) 12.5 MG tablet Take 18.75 mg by mouth 2 (two) times daily with a meal.    Historical Provider, MD  doxazosin (CARDURA) 4 MG tablet Take 4 mg by mouth daily.    Historical Provider, MD  furosemide (LASIX) 40 MG tablet Take 1 tablet (40 mg total) by mouth daily. 09/04/14   Ria Bush, MD  gabapentin (NEURONTIN) 300 MG capsule Take 1 capsule (300 mg total) by mouth 3 (three) times daily. 07/22/14   Dorothy Spark, MD  hydrocortisone (ANUSOL-HC) 2.5 % rectal cream Place 1 application rectally 2 (two) times daily. 10/01/14   Ria Bush, MD  ibuprofen (ADVIL,MOTRIN) 200 MG tablet Take 400 mg by mouth every 6 (six) hours as needed for moderate pain.    Historical Provider, MD  omeprazole (PRILOSEC) 20 MG capsule Take 20 mg by mouth daily. 06/30/14   Historical Provider, MD  rifaximin (XIFAXAN) 550 MG TABS tablet Take 1 tablet (550 mg total) by mouth 2 (two) times daily. 09/25/14   Inda Castle, MD  sildenafil (VIAGRA) 100 MG tablet Take 0.5-1 tablets (50-100 mg total) by mouth daily as needed for erectile dysfunction. 10/01/14   Ria Bush, MD  spironolactone (ALDACTONE) 50 MG tablet Take 1 tablet (50 mg total) by mouth 2 (two) times daily. 09/10/14   Ria Bush, MD  Tetrahydrozoline HCl (VISINE OP) Place 1 drop into both eyes daily.    Historical Provider, MD   Physical Exam: Filed Vitals:   11/21/14 1800 11/21/14 1815 11/21/14 1828 11/21/14 1830  BP: 105/44 104/45  101/40  Pulse: 82 80  81  Temp:   99.1 F (37.3 C)   TempSrc:   Rectal   Resp:      Height:      Weight:      SpO2: 95% 94%  98%    Wt Readings from Last 3 Encounters:  11/21/14 122.925 kg (271 lb)  11/18/14 122.925 kg (271 lb)  10/22/14 121.564 kg (268 lb)    General:  Appears calm and comfortable Eyes: PERRL, normal lids, irises & conjunctiva ENT: grossly normal hearing, lips & tongue Neck: no LAD, masses or thyromegaly Cardiovascular: RRR,  no m/r/g. No LE edema. Telemetry: SR, no arrhythmias  Respiratory: CTA bilaterally, no w/r/r. Normal respiratory effort. Abdomen: soft, , mildly distended. Skin: erythema of the left foot, not tender to palpation but want to touch. Musculoskeletal: grossly normal tone BUE/BLE Psychiatric: grossly normal mood and affect, speech fluent and appropriate Neurologic: grossly non-focal.          Labs on Admission:  Basic Metabolic Panel:  Recent Labs Lab 11/18/14 1005 11/21/14 1430  NA 134* 133*  K 3.6 3.7  CL 104 102  CO2 28 23  GLUCOSE 91 102*  BUN 9 12  CREATININE 0.92 1.32*  CALCIUM 8.2* 8.0*   Liver Function Tests:  Recent Labs Lab 11/18/14 1005 11/21/14 1430  AST 51* 55*  ALT 24 25  ALKPHOS 203* 104  BILITOT 2.0* 3.9*  PROT 7.2 6.9  ALBUMIN 2.1* 1.8*   No results for input(s): LIPASE, AMYLASE in the last 168 hours. No results for input(s): AMMONIA in the last 168 hours. CBC:  Recent Labs Lab 11/18/14 1005 11/21/14 1430  WBC 2.6* 6.7  NEUTROABS 1.4 5.5  HGB 12.1* 11.9*  HCT 34.5* 33.2*  MCV 100.7* 97.9  PLT 38.0 Repeated and verified X2.* 33*   Cardiac Enzymes: No results for input(s): CKTOTAL, CKMB, CKMBINDEX, TROPONINI in the last 168 hours.  BNP (last 3 results) No results for input(s): BNP in the last 8760 hours.  ProBNP (last 3 results) No results for input(s): PROBNP in the last 8760 hours.  CBG: No results for input(s): GLUCAP in the last 168 hours.  Radiological Exams on Admission: Dg Chest 2 View  11/21/2014   CLINICAL DATA:  Body aches for 2 days with fever. Chronic dry cough and shortness of breath with sinus congestion.  EXAM: CHEST  2 VIEW  COMPARISON:  03/31/2014 and 06/04/2013.  FINDINGS: The heart size and mediastinal contours are normal. The lungs are clear. There is no pleural effusion or pneumothorax. No acute osseous findings are identified.  IMPRESSION: Stable chest.  No active cardiopulmonary process.   Electronically Signed    By: Richardean Sale M.D.   On: 11/21/2014 15:14    EKG: Independently reviewed. CXR  Assessment/Plan  Hypotension/SIRS: - Patient is a poor historian, I think he is unreliable when asked about his liver disease he relates "they say is due to alcohol I doubted". - Unclear etiology, usually has leukopenia and now is what blood cell count is 6, I am concerned about infectious etiology either  UTI, or Lower extremity cellulitis, the ED has already giving him 2 L of normal saline and will continue IV hydration. - We'll start him on vancomycin and continue Rocephin. The ED has already ordered blood cultures, will send for urine cultures. - There are no lower respiratory tract infections. Chest x-ray is clean. - Continue IV fluids.  Acute kidney injury: Most likely prerenal in etiology, will continue IV fluids placed a Foley strict I's and O's. Check urinary sodium.  Hyponatremia: Unclear etiology K urinary sodium and urinary creatinine. Continue IV hydration hold diuretics.  Essential hypertension: - Due to his low blood pressure will hold antihypertensive medications.  Chronic diastolic heart failure - Hold Lasix and spironolactone due to his hypotension. - Place a Foley monitor strict I's and O's. Radial 2 L of normal saline in the emergency room.  Alcoholic cirrhosis of liver with ascites - His AST and ALT are split 2:1.  Chronic Pancytopenia, acquired: - Likely due to alcohol abuse. Continue to monitor.  Normocytic anemia: Most likely due to alcohol abuse. He denies any melanotic stools. No overt signs of bleeding.   Code Status: full DVT Prophylaxis:heparin Family Communication: wife Disposition Plan: inpatient  Time spent: 24 min  Charlynne Cousins [facility] Pager [phone]

## 2014-11-21 NOTE — ED Notes (Addendum)
Pt was sent here by [place] for eval of hypotension and fever. Pt reports generalized body aches as well. Pt reports increased swelling in legs and is more tired than normal.

## 2014-11-21 NOTE — Progress Notes (Signed)
Subjective:    Patient ID: Colton Porter, male    DOB: December 15, 1965, 49 y.o.   MRN: [id number]  HPI  Colton Porter is a 49 year old male who presents today with a chief complaint of chills, headaches, and fatigue for several months. He reports joint and muscle aches chronically but worsened since last night at 7pm with pain to his shoulders, knees, abdomen.  He's been acting confused, fatigued and drowsy since last night at 7pm, and continues to feel drowsy with hypotension and low grade fever today. He did take an Ambien last night around 9pm.  He was evaluated on 5/24 by PCP for fatigue which was determined to possibly be from a new medication he's been taking, Xifaxan.  Muscle spasms (9%) and arthralgias (5%) are side effects of Xifaxan. BMP from 5/24 mostly unremarkable. Sodium 134, potassium 3.6. Platelets low, chronically low with improvement.  His presentation today has changed acutely from his presentation on Tuesday as he is is less interactive and more fatigued.  Review of Systems  Constitutional: Positive for fever and chills.  HENT: Negative for rhinorrhea.   Respiratory: Negative for cough and shortness of breath.   Cardiovascular: Negative for chest pain.  Gastrointestinal: Positive for abdominal pain.  Musculoskeletal: Positive for myalgias and arthralgias.  Skin: Positive for rash.  Neurological: Positive for dizziness, light-headedness and headaches.       Past Medical History  Diagnosis Date  . Allergy   . GERD (gastroesophageal reflux disease)   . Hyperlipidemia   . Hypertension   . Neuropathy   . Thrombocytopenia 12/15/2011  . Chronic diastolic heart failure 3/0/8657  . Alcohol dependence 07/11/2012  . Alcoholic cirrhosis of liver with ascites 07/2014  . OSA on CPAP 07/11/2012    HST 07/2013:  AHI 39/hr.      History   Social History  . Marital Status: Married    Spouse Name: N/A  . Number of Children: 2  . Years of Education: N/A   Occupational History    . [employer]     no work lately   Social History Main Topics  . Smoking status: Never Smoker   . Smokeless tobacco: Current User    Types: Chew  . Alcohol Use: 0.0 oz/week    0 Standard drinks or equivalent per week     Comment: 4-6 drinks daily  . Drug Use: No  . Sexual Activity: Not on file   Other Topics Concern  . Not on file   Social History Narrative        Past Surgical History  Procedure Laterality Date  . Hand surgery  1997  . Left and right heart catheterization with coronary angiogram N/A 06/10/2013    Procedure: LEFT AND RIGHT HEART CATHETERIZATION WITH CORONARY ANGIOGRAM;  Surgeon: Blane Ohara, MD;  Location: [facility] CATH LAB;  Service: Cardiovascular;  Laterality: N/A;  . Colonoscopy  08/2013    hyperplastic polyps, hemorrhoids Ardis Hughs)  . 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 Stroke Mother   . Hypertension Father   . Heart disease Father   . Lung cancer Maternal Aunt   . Emphysema Mother   . Emphysema Father   . Heart attack Neg Hx   . Stroke Paternal Grandmother     Allergies  Allergen Reactions  . Hydrochlorothiazide W-Triamterene Other (See Comments)    REACTION: dizzy, nausea  . Lisinopril  Other (See Comments)    REACTION: cough, decreased libido    Current Outpatient Prescriptions on File Prior to Visit  Medication Sig Dispense Refill  . ALPRAZolam (XANAX) 0.5 MG tablet Take 1 tablet (0.5 mg total) by mouth 2 (two) times daily as needed for anxiety. 30 tablet 0  . carvedilol (COREG) 12.5 MG tablet Take 18.75 mg by mouth 2 (two) times daily with a meal.    . doxazosin (CARDURA) 4 MG tablet Take 4 mg by mouth daily.    . furosemide (LASIX) 40 MG tablet Take 1 tablet (40 mg total) by mouth daily. 90 tablet 3  . gabapentin (NEURONTIN) 300 MG capsule Take 1 capsule (300 mg total) by mouth 3 (three) times daily. 120 capsule 3  .  hydrocortisone (ANUSOL-HC) 2.5 % rectal cream Place 1 application rectally 2 (two) times daily. 30 g 0  . ibuprofen (ADVIL,MOTRIN) 200 MG tablet Take 400 mg by mouth every 6 (six) hours as needed for moderate pain.    Marland Kitchen omeprazole (PRILOSEC) 20 MG capsule Take 20 mg by mouth daily.  3  . rifaximin (XIFAXAN) 550 MG TABS tablet Take 1 tablet (550 mg total) by mouth 2 (two) times daily. 60 tablet 3  . sildenafil (VIAGRA) 100 MG tablet Take 0.5-1 tablets (50-100 mg total) by mouth daily as needed for erectile dysfunction. 9 tablet 3  . spironolactone (ALDACTONE) 50 MG tablet Take 1 tablet (50 mg total) by mouth 2 (two) times daily. 60 tablet 3  . Tetrahydrozoline HCl (VISINE OP) Place 1 drop into both eyes daily.     No current facility-administered medications on file prior to visit.    BP 96/58 mmHg  Pulse 88  Temp(Src) 99.3 F (37.4 C) (Oral)  Wt   SpO2 91%    Objective:   Physical Exam  Constitutional: He has a sickly appearance. He appears ill.  Neck: Neck supple.  Cardiovascular: Normal rate and regular rhythm.   Pulmonary/Chest: Effort normal and breath sounds normal. He has no wheezes.  Abdominal: Soft. Bowel sounds are normal. There is tenderness.  Lymphadenopathy:    He has no cervical adenopathy.  Neurological: He is alert.  Believes it's Thursday. Knows his DOB, name, and current president.  Skin: Skin is warm. Rash noted.  Small rash noted to right lower posterior trunk. Itches, no pain. Do not suspect shingles. No blistering or crusting. Appears to be from insect bite.          Assessment & Plan:  Generalized fatigue:  Appears acutely ill. Significant change since evaluation Tuesday 5/24 per patient's PCP. Due to decrease in blood pressure, low grade fever, and presentation, he is to go to the emergency department at [place] immediately.  His family agrees and will take him now. Report called to Jinny Blossom, RN at [facility] ER.

## 2014-11-21 NOTE — ED Notes (Signed)
Patient given urinal, attempting to provide urine sample at bedside.

## 2014-11-21 NOTE — Patient Instructions (Signed)
Go to the emergency department. There's been an acute change in his status since Tuesday. His blood pressure is lower and he's running a low grade fever. Due to his medical history he may require IV fluids for re-hydration. It was nice meeting you!

## 2014-11-22 DIAGNOSIS — N390 Urinary tract infection, site not specified: Secondary | ICD-10-CM

## 2014-11-22 DIAGNOSIS — D619 Aplastic anemia, unspecified: Secondary | ICD-10-CM

## 2014-11-22 LAB — COMPREHENSIVE METABOLIC PANEL
ALK PHOS: 90 U/L (ref 38–126)
ALT: 23 U/L (ref 17–63)
AST: 47 U/L — AB (ref 15–41)
Albumin: 1.6 g/dL — ABNORMAL LOW (ref 3.5–5.0)
Anion gap: 5 (ref 5–15)
BUN: 15 mg/dL (ref 6–20)
CO2: 23 mmol/L (ref 22–32)
Calcium: 7.7 mg/dL — ABNORMAL LOW (ref 8.9–10.3)
Chloride: 106 mmol/L (ref 101–111)
Creatinine, Ser: 1.2 mg/dL (ref 0.61–1.24)
GFR calc Af Amer: >60 mL/min (ref >60)
GFR calc non Af Amer: >60 mL/min (ref >60)
GLUCOSE: 119 mg/dL — AB (ref 65–99)
POTASSIUM: 3.5 mmol/L (ref 3.5–5.1)
Sodium: 134 mmol/L — ABNORMAL LOW (ref 135–145)
Total Bilirubin: 2.6 mg/dL — ABNORMAL HIGH (ref 0.3–1.2)
Total Protein: 6.5 g/dL (ref 6.5–8.1)

## 2014-11-22 LAB — CBC
HCT: 30.1 % — ABNORMAL LOW (ref 39.0–52.0)
Hemoglobin: 10.8 g/dL — ABNORMAL LOW (ref 13.0–17.0)
MCH: 35 pg — AB (ref 26.0–34.0)
MCHC: 35.9 g/dL (ref 30.0–36.0)
MCV: 97.4 fL (ref 78.0–100.0)
Platelets: 30 10*3/uL — ABNORMAL LOW (ref 150–400)
RBC: 3.09 MIL/uL — ABNORMAL LOW (ref 4.22–5.81)
RDW: 15.2 % (ref 11.5–15.5)
WBC: 5.7 10*3/uL (ref 4.0–10.5)

## 2014-11-22 LAB — AMMONIA: AMMONIA: 46 umol/L — AB (ref 9–35)

## 2014-11-22 LAB — MRSA PCR SCREENING: MRSA by PCR: NEGATIVE

## 2014-11-22 LAB — CK: CK TOTAL: 70 U/L (ref 49–397)

## 2014-11-22 MED ORDER — PNEUMOCOCCAL VAC POLYVALENT 25 MCG/0.5ML IJ INJ
0.5000 mL | INJECTION | INTRAMUSCULAR | Status: DC
  Filled 2014-11-22: qty 0.5

## 2014-11-22 NOTE — Progress Notes (Signed)
RN consulted Pharmacist about pt Plt of 30. Recommended to hold AM hep SQ dose, encourage pt to ambulate and to consult rounding team during the day. RN will hold the 0600 hep SQ dose and alert day RN to update rounding/primary MDs.   Alessandra Grout, RN

## 2014-11-22 NOTE — Progress Notes (Signed)
[place] TEAM 1 - Stepdown/ICU TEAM Progress Note  Colton Porter [id number] DOB: December 05, 1965 DOA: 11/21/2014 PCP: Ria Bush, MD  Admit HPI / Brief Narrative: 49 y.o. male with history of hypertension, neuropathy, thrombocytopenia and cirrhosis due to alcohol abuse, and chronic systolic heart failure who came to the ED after being referred by his PCP.  His primary care doctor earlier in the week started him on rifaximin.  He went back to the PCP's office the day of admission due to myalgias and chills which had been progressively getting worse. At the PCPs office he had a fever and was hypotensive.   In the ED he was found to be mildly hypotensive got 2 L of normal saline and his blood pressure came up to 101/40, he was mildly hyponatremic with a new rising creatinine 1.3 (baseline creatinine less than 1), his lactic acid was 1.7, and his white count was 6.7.  HPI/Subjective: The patient is alert and interactive.  He is frustrated that he is in the hospital and feels that he should be at home.  He presently denies fevers chills nausea vomiting shortness of breath or abdominal pain.  He states his primary complaint has been "generalized aching and weakness" for many weeks now.  Assessment/Plan:  SIRS v/s Sepsis  UA is equivocal - follow culture - hemodynamically stabilizing with simple volume resuscitation - check CK to rule out rhabdomyolysis  Hypotension Blood pressure has improved with volume resuscitation  Acute kidney injury Creatinine is improving with volume resuscitation  Hyponatremia  Sodium is stable - follow trend   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 + thrombocytopenia / anemia Blood counts are stable - abdominal ultrasound March 2016 confirm cirrhotic architecture of liver with no focal hepatic abnormality as well as enlarged spleen - review of serial ultrasounds dating back to 2010 document progression from fatty liver to cirrhosis  with splenomegaly - the patient informs me he has been entirely free of alcohol for 36+ days  Chronic diastolic congestive heart failure No evidence of acute decompensation presently  Obesity - Body mass index is 42.43 kg/(m^2).  Code Status: FULL Family Communication: Spoke with wife and sister at bedside at length Disposition Plan: SDU  Consultants: none  Procedures: none  Antibiotics: Ceftriaxone 5/27 > Vanc 5/27 >  DVT prophylaxis: SQ heparin   Objective: Blood pressure 111/60, pulse 70, temperature 97.5 F (36.4 C), temperature source Oral, resp. rate 20, height 5\' 7"  (1.702 m), weight 122.9 kg (270 lb 15.1 oz), SpO2 98 %.  Intake/Output Summary (Last 24 hours) at 11/22/14 1014 Last data filed at 11/22/14 0400  Gross per 24 hour  Intake 2832.5 ml  Output    350 ml  Net 2482.5 ml   Exam: General: No acute respiratory distress Lungs: Clear to auscultation bilaterally without wheezes or crackles Cardiovascular: Regular rate and rhythm without murmur gallop or rub normal S1 and S2 Abdomen: Nontender, protuberant but stable in size per patient, soft, bowel sounds positive, no rebound, no ascites, no appreciable mass Extremities: No significant cyanosis, clubbing;  2+ chronic appearing edema bilateral lower extremities  Data Reviewed: Basic Metabolic Panel:  Recent Labs Lab 11/18/14 1005 11/21/14 1430 11/22/14 0246  NA 134* 133* 134*  K 3.6 3.7 3.5  CL 104 102 106  CO2 28 23 23   GLUCOSE 91 102* 119*  BUN 9 12 15   CREATININE 0.92 1.32* 1.20  CALCIUM 8.2* 8.0* 7.7*    CBC:  Recent Labs Lab 11/18/14 1005 11/21/14  1430 11/22/14 0246  WBC 2.6* 6.7 5.7  NEUTROABS 1.4 5.5  --   HGB 12.1* 11.9* 10.8*  HCT 34.5* 33.2* 30.1*  MCV 100.7* 97.9 97.4  PLT 38.0 Repeated and verified X2.* 33* 30*    Liver Function Tests:  Recent Labs Lab 11/18/14 1005 11/21/14 1430 11/22/14 0246  AST 51* 55* 47*  ALT 24 25 23   ALKPHOS 203* 104 90  BILITOT 2.0* 3.9*  2.6*  PROT 7.2 6.9 6.5  ALBUMIN 2.1* 1.8* 1.6*   No results for input(s): LIPASE, AMYLASE in the last 168 hours. No results for input(s): AMMONIA in the last 168 hours.  Coags: No results for input(s): INR in the last 168 hours.  Invalid input(s): PT No results for input(s): APTT in the last 168 hours.  Cardiac Enzymes: No results for input(s): CKTOTAL, CKMB, CKMBINDEX, TROPONINI in the last 168 hours.  CBG: No results for input(s): GLUCAP in the last 168 hours.  Recent Results (from the past 240 hour(s))  MRSA PCR Screening     Status: None   Collection Time: 11/21/14 10:32 PM  Result Value Ref Range Status   MRSA by PCR NEGATIVE NEGATIVE Final    Comment:        The GeneXpert MRSA Assay (FDA approved for NASAL specimens only), is one component of a comprehensive MRSA colonization surveillance program. It is not intended to diagnose MRSA infection nor to guide or monitor treatment for MRSA infections.      Studies:   Recent x-ray studies have been reviewed in detail by the Attending Physician  Scheduled Meds:  Scheduled Meds: . cefTRIAXone (ROCEPHIN)  IV  2 g Intravenous Q24H  . heparin  5,000 Units Subcutaneous 3 times per day  . pantoprazole  40 mg Oral Daily  . [START ON 11/23/2014] pneumococcal 23 valent vaccine  0.5 mL Intramuscular Tomorrow-1000  . rifaximin  550 mg Oral BID  . sodium chloride  3 mL Intravenous Q12H  . vancomycin  1,000 mg Intravenous Q12H    Time spent on care of this patient: 35 mins   MCCLUNG,JEFFREY T , MD   [facility] Office  [phone] Pager - Text Page per Shea Evans as per below:  On-Call/Text Page:      [email or web address]      password [email or web address]  If 7PM-7AM, please contact night-coverage [email or web address] Password [email or web address] 11/22/2014, 10:14 AM   LOS: 1 day

## 2014-11-23 LAB — CBC
HCT: 29.7 % — ABNORMAL LOW (ref 39.0–52.0)
Hemoglobin: 10.5 g/dL — ABNORMAL LOW (ref 13.0–17.0)
MCH: 34.7 pg — ABNORMAL HIGH (ref 26.0–34.0)
MCHC: 35.4 g/dL (ref 30.0–36.0)
MCV: 98 fL (ref 78.0–100.0)
PLATELETS: 30 10*3/uL — AB (ref 150–400)
RBC: 3.03 MIL/uL — AB (ref 4.22–5.81)
RDW: 15.4 % (ref 11.5–15.5)
WBC: 3.7 10*3/uL — AB (ref 4.0–10.5)

## 2014-11-23 LAB — COMPREHENSIVE METABOLIC PANEL
ALT: 23 U/L (ref 17–63)
ANION GAP: 3 — AB (ref 5–15)
AST: 46 U/L — ABNORMAL HIGH (ref 15–41)
Albumin: 1.6 g/dL — ABNORMAL LOW (ref 3.5–5.0)
Alkaline Phosphatase: 85 U/L (ref 38–126)
BUN: 16 mg/dL (ref 6–20)
CO2: 24 mmol/L (ref 22–32)
Calcium: 7.7 mg/dL — ABNORMAL LOW (ref 8.9–10.3)
Chloride: 106 mmol/L (ref 101–111)
Creatinine, Ser: 1.5 mg/dL — ABNORMAL HIGH (ref 0.61–1.24)
GFR, EST NON AFRICAN AMERICAN: 53 mL/min — AB (ref >60)
Glucose, Bld: 100 mg/dL — ABNORMAL HIGH (ref 65–99)
Potassium: 3.2 mmol/L — ABNORMAL LOW (ref 3.5–5.1)
Sodium: 133 mmol/L — ABNORMAL LOW (ref 135–145)
Total Bilirubin: 2.2 mg/dL — ABNORMAL HIGH (ref 0.3–1.2)
Total Protein: 6.2 g/dL — ABNORMAL LOW (ref 6.5–8.1)

## 2014-11-23 LAB — URINE CULTURE
CULTURE: NO GROWTH
Colony Count: 2000
Colony Count: NO GROWTH

## 2014-11-23 LAB — AMMONIA: Ammonia: 62 umol/L — ABNORMAL HIGH (ref 9–35)

## 2014-11-23 LAB — PROTIME-INR
INR: 1.77 — ABNORMAL HIGH (ref 0.00–1.49)
Prothrombin Time: 20.6 seconds — ABNORMAL HIGH (ref 11.6–15.2)

## 2014-11-23 LAB — HIV ANTIBODY (ROUTINE TESTING W REFLEX): HIV Screen 4th Generation wRfx: NONREACTIVE

## 2014-11-23 MED ORDER — LACTULOSE 10 GM/15ML PO SOLN
30.0000 g | Freq: Three times a day (TID) | ORAL | Status: DC
  Administered 2014-11-23 – 2014-11-25 (×6): 30 g via ORAL
  Filled 2014-11-23 (×8): qty 45

## 2014-11-23 MED ORDER — TRAMADOL HCL 50 MG PO TABS
50.0000 mg | ORAL_TABLET | Freq: Once | ORAL | Status: AC
Start: 2014-11-23 — End: 2014-11-23
  Administered 2014-11-23: 50 mg via ORAL
  Filled 2014-11-23: qty 1

## 2014-11-23 MED ORDER — PHYTONADIONE 5 MG PO TABS
10.0000 mg | ORAL_TABLET | Freq: Every day | ORAL | Status: DC
Start: 2014-11-23 — End: 2014-11-29
  Administered 2014-11-23 – 2014-11-29 (×7): 10 mg via ORAL
  Filled 2014-11-23 (×7): qty 2

## 2014-11-23 NOTE — Progress Notes (Signed)
[place] TEAM 1 - Stepdown/ICU TEAM Progress Note  ZAYLIN RUNCO [id number] DOB: Jul 24, 1965 DOA: 11/21/2014 PCP: Ria Bush, MD  Admit HPI / Brief Narrative: 49 y.o. male with history of hypertension, neuropathy, thrombocytopenia and cirrhosis due to alcohol abuse, and chronic systolic heart failure who came to the ED after being referred by his PCP.  His primary care doctor earlier in the week started him on rifaximin.  He went back to the PCP's office the day of admission due to myalgias and chills which had been progressively getting worse. At the PCPs office he had a fever and was hypotensive.   In the ED he was found to be mildly hypotensive got 2 L of normal saline and his blood pressure came up to 101/40, he was mildly hyponatremic with a rising creatinine 1.3 (baseline creatinine less than 1), his lactic acid was 1.7, and his white count was 6.7.  HPI/Subjective: I had an extensive/extended discussion with the patient and his wife at the bedside concerning his present clinical situation and his meld score of 20 as well as the implications of this.  The family has noted intermittent confusion throughout the day with a 3-4 hour period today just after his rifaximin dosing during which he was essentially in a stupor.  He denies chest pain nausea vomiting  Assessment/Plan:  SIRS v/s Sepsis  UA is equivocal - urine culture without significant growth - CK normal - blood cultures unrevealing - no clinical signs or x-ray signs to suggest pneumonia - discontinue antibiotics and follow clinically  Hypotension Blood pressure improved transiently with volume resuscitation but remains quite marginal at present - patient is third spacing resuscitated volume with recurring hypotension - we will not use diuretic at this time due to his renal injury but will discontinue volume resuscitation and follow  Acute kidney injury Creatinine is now climbing - we are in a tough position with  intravascular volume depletion but total body volume overload - this appears to primarily be in oncotic issue - slow IV fluid, avoid potential nephrotoxic toxins, and follow renal function in serial fashion   Hyponatremia  Sodium is stable - follow trend   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 + thrombocytopenia / anemia Patient remains pancytopenic - abdominal ultrasound March 2016 confirmed cirrhotic architecture of liver with no focal hepatic abnormality as well as enlarged spleen - review of serial ultrasounds dating back to 2010 document progression from fatty liver to cirrhosis with splenomegaly - the patient informs me he has been entirely free of alcohol for 36+ days - MELD score presently 20 indicating 50-75% 3 month mortality - I discussed the serious nature of his current illness with him and his wife in frank clear terms - I will dose the patient with vitamin K - I will change to lactulose and attempt to better control his hepatic encephalopathy - I will consult his GI team to see him in the next 24-48 hours  Chronic diastolic congestive heart failure Present volume overload appears to be oncotic/cirrhotic in nature and not due to CHF - follow  Obesity - Body mass index is 42.43 kg/(m^2).  Code Status: FULL Family Communication: Spoke with wife at bedside at length Disposition Plan: SDU  Consultants: none  Procedures: none  Antibiotics: Ceftriaxone 5/27 > 5/29 Vanc 5/27 > 5/29  DVT prophylaxis: SCDs  Objective: Blood pressure 107/54, pulse 64, temperature 97.8 F (36.6 C), temperature source Oral, resp. rate 18, height 5\' 7"  (1.702 m), weight  122.9 kg (270 lb 15.1 oz), SpO2 96 %.  Intake/Output Summary (Last 24 hours) at 11/23/14 1517 Last data filed at 11/23/14 1458  Gross per 24 hour  Intake   1446 ml  Output   1750 ml  Net   -304 ml   Exam: General: No acute respiratory distress - presently alert and conversant Lungs: Clear to  auscultation bilaterally without wheezes or crackles Cardiovascular: Regular rate and rhythm without murmur gallop or rub normal S1 and S2 Abdomen: Nontender, protuberant but stable in size, soft, bowel sounds positive, no rebound, no ascites, no appreciable mass Extremities: No significant cyanosis, clubbing;  3+ bilateral lower extremities  Data Reviewed: Basic Metabolic Panel:  Recent Labs Lab 11/18/14 1005 11/21/14 1430 11/22/14 0246 11/23/14 0339  NA 134* 133* 134* 133*  K 3.6 3.7 3.5 3.2*  CL 104 102 106 106  CO2 28 23 23 24   GLUCOSE 91 102* 119* 100*  BUN 9 12 15 16   CREATININE 0.92 1.32* 1.20 1.50*  CALCIUM 8.2* 8.0* 7.7* 7.7*    CBC:  Recent Labs Lab 11/18/14 1005 11/21/14 1430 11/22/14 0246 11/23/14 0339  WBC 2.6* 6.7 5.7 3.7*  NEUTROABS 1.4 5.5  --   --   HGB 12.1* 11.9* 10.8* 10.5*  HCT 34.5* 33.2* 30.1* 29.7*  MCV 100.7* 97.9 97.4 98.0  PLT 38.0 Repeated and verified X2.* 33* 30* 30*    Liver Function Tests:  Recent Labs Lab 11/18/14 1005 11/21/14 1430 11/22/14 0246 11/23/14 0339  AST 51* 55* 47* 46*  ALT 24 25 23 23   ALKPHOS 203* 104 90 85  BILITOT 2.0* 3.9* 2.6* 2.2*  PROT 7.2 6.9 6.5 6.2*  ALBUMIN 2.1* 1.8* 1.6* 1.6*    Recent Labs Lab 11/22/14 1140 11/23/14 0339  AMMONIA 46* 62*   Coags:  Recent Labs Lab 11/23/14 0339  INR 1.77*   Cardiac Enzymes:  Recent Labs Lab 11/22/14 1140  CKTOTAL 70    Recent Results (from the past 240 hour(s))  Blood culture (routine x 2)     Status: None (Preliminary result)   Collection Time: 11/21/14  2:30 PM  Result Value Ref Range Status   Specimen Description BLOOD ARM RIGHT  Final   Special Requests BOTTLES DRAWN AEROBIC ONLY 2CC  Final   Culture   Final           BLOOD CULTURE RECEIVED NO GROWTH TO DATE CULTURE WILL BE HELD FOR 5 DAYS BEFORE ISSUING A FINAL NEGATIVE REPORT Performed at [facility]    Report Status PENDING  Incomplete  Blood culture (routine x 2)      Status: None (Preliminary result)   Collection Time: 11/21/14  4:04 PM  Result Value Ref Range Status   Specimen Description BLOOD LEFT ARM  Final   Special Requests BOTTLES DRAWN AEROBIC AND ANAEROBIC 5CC  Final   Culture   Final           BLOOD CULTURE RECEIVED NO GROWTH TO DATE CULTURE WILL BE HELD FOR 5 DAYS BEFORE ISSUING A FINAL NEGATIVE REPORT Performed at [facility]    Report Status PENDING  Incomplete  Urine culture     Status: None   Collection Time: 11/21/14  4:55 PM  Result Value Ref Range Status   Specimen Description URINE, RANDOM  Final   Special Requests NONE  Final   Colony Count   Final    2,000 COLONIES/ML Performed at [facility]   Final  INSIGNIFICANT GROWTH Performed at [facility]    Report Status 11/23/2014 FINAL  Final  Urine culture     Status: None   Collection Time: 11/21/14  9:33 PM  Result Value Ref Range Status   Specimen Description URINE, RANDOM  Final   Special Requests NONE  Final   Colony Count NO GROWTH Performed at [facility]   Final   Culture NO GROWTH Performed at [facility]   Final   Report Status 11/23/2014 FINAL  Final  MRSA PCR Screening     Status: None   Collection Time: 11/21/14 10:32 PM  Result Value Ref Range Status   MRSA by PCR NEGATIVE NEGATIVE Final    Comment:        The GeneXpert MRSA Assay (FDA approved for NASAL specimens only), is one component of a comprehensive MRSA colonization surveillance program. It is not intended to diagnose MRSA infection nor to guide or monitor treatment for MRSA infections.      Studies:   Recent x-ray studies have been reviewed in detail by the Attending Physician  Scheduled Meds:  Scheduled Meds: . cefTRIAXone (ROCEPHIN)  IV  2 g Intravenous Q24H  . pantoprazole  40 mg Oral Daily  . pneumococcal 23 valent vaccine  0.5 mL Intramuscular Tomorrow-1000  . rifaximin  550 mg Oral BID  . sodium chloride  3 mL  Intravenous Q12H  . vancomycin  1,000 mg Intravenous Q12H    Time spent on care of this patient: 35 mins   MCCLUNG,JEFFREY T , MD   [facility] Office  [phone] Pager - Text Page per Shea Evans as per below:  On-Call/Text Page:      [email or web address]      password [email or web address]  If 7PM-7AM, please contact night-coverage [email or web address] Password [email or web address] 11/23/2014, 3:17 PM   LOS: 2 days

## 2014-11-24 LAB — COMPREHENSIVE METABOLIC PANEL
ALT: 22 U/L (ref 17–63)
AST: 47 U/L — ABNORMAL HIGH (ref 15–41)
Albumin: 1.7 g/dL — ABNORMAL LOW (ref 3.5–5.0)
Alkaline Phosphatase: 74 U/L (ref 38–126)
Anion gap: 5 (ref 5–15)
BUN: 15 mg/dL (ref 6–20)
CALCIUM: 8 mg/dL — AB (ref 8.9–10.3)
CO2: 23 mmol/L (ref 22–32)
Chloride: 107 mmol/L (ref 101–111)
Creatinine, Ser: 1.36 mg/dL — ABNORMAL HIGH (ref 0.61–1.24)
GFR calc Af Amer: >60 mL/min (ref >60)
GFR calc non Af Amer: >60 mL/min (ref >60)
Glucose, Bld: 90 mg/dL (ref 65–99)
POTASSIUM: 3.8 mmol/L (ref 3.5–5.1)
SODIUM: 135 mmol/L (ref 135–145)
Total Bilirubin: 2.4 mg/dL — ABNORMAL HIGH (ref 0.3–1.2)
Total Protein: 6.5 g/dL (ref 6.5–8.1)

## 2014-11-24 LAB — CBC
HCT: 30.1 % — ABNORMAL LOW (ref 39.0–52.0)
HEMOGLOBIN: 10.7 g/dL — AB (ref 13.0–17.0)
MCH: 34.7 pg — ABNORMAL HIGH (ref 26.0–34.0)
MCHC: 35.5 g/dL (ref 30.0–36.0)
MCV: 97.7 fL (ref 78.0–100.0)
Platelets: 39 10*3/uL — ABNORMAL LOW (ref 150–400)
RBC: 3.08 MIL/uL — ABNORMAL LOW (ref 4.22–5.81)
RDW: 15.3 % (ref 11.5–15.5)
WBC: 3.2 10*3/uL — ABNORMAL LOW (ref 4.0–10.5)

## 2014-11-24 LAB — PROTIME-INR
INR: 1.71 — ABNORMAL HIGH (ref 0.00–1.49)
PROTHROMBIN TIME: 20.1 s — AB (ref 11.6–15.2)

## 2014-11-24 LAB — AMMONIA: Ammonia: 40 umol/L — ABNORMAL HIGH (ref 9–35)

## 2014-11-24 NOTE — Progress Notes (Signed)
Utilization Review Completed.Donne Anon T5/30/2016

## 2014-11-24 NOTE — Progress Notes (Signed)
[facility] TEAM 1 - Stepdown/ICU TEAM Progress Note  Colton Porter LYY:[id number] DOB: 1965/11/05 DOA: 11/21/2014 PCP: Ria Bush, MD  Admit HPI / Brief Narrative: 49 y.o. male with history of hypertension, neuropathy, thrombocytopenia and cirrhosis due to alcohol abuse, and chronic systolic heart failure who came to the ED after being referred by his PCP.  His primary care doctor earlier in the week started him on rifaximin.  He went back to the PCP's office the day of admission due to myalgias and chills which had been progressively getting worse. At the PCPs office he had a fever and was hypotensive.   In the ED he was found to be mildly hypotensive got 2 L of normal saline and his blood pressure came up to 101/40, he was mildly hyponatremic with a rising creatinine 1.3 (baseline creatinine less than 1), his lactic acid was 1.7, and his white count was 6.7.  HPI/Subjective: The patient is much more alert today and reports not having any confusion over the last 12-14 hours.  He denies chest pain shortness of breath nausea or vomiting.  I have clarified to the patient that his meld score of 20 is actually reflective of a 20% 3 month mortality, and not in the 50% I quoted him yesterday when I was mistakenly looking at the post-TIPS data.  Assessment/Plan:  SIRS v/s Sepsis  UA is equivocal - urine culture without significant growth - CK normal - blood cultures unrevealing - no clinical signs or x-ray signs to suggest pneumonia - discontinue antibiotics and follow clinically - suspect his SIRS picture is due rather to intravascular volume depletion due to low oncotic pressure  Hypotension Blood pressure has improved and appears to be stabilizing at this time - patient has third spaced resuscitated volume - we will not use diuretic at this time due to his renal injury but have discontinued volume resuscitation - if blood pressure remains stable tomorrow we'll attempt to resume  diuretic  Acute kidney injury Creatinine appears to be stabilizing for now - we are in a tough position with intravascular volume depletion but total body volume overload - this appears to primarily be an oncotic issue - avoid potential nephrotoxic toxins, and follow renal function in serial fashion   Hyponatremia  Sodium has improved - follow trend  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 + thrombocytopenia / anemia Patient remains pancytopenic - abdominal ultrasound March 2016 confirmed cirrhotic architecture of liver with no focal hepatic abnormality as well as enlarged spleen - review of serial ultrasounds dating back to 2010 document progression from fatty liver to cirrhosis with splenomegaly - the patient informs me he has been entirely free of alcohol for 36+ days - MELD score presently 20 indicating 20% 3 month mortality - I discussed the serious nature of his current illness with him and his wife in frank clear terms again today - continue to dose with vitamin K but thus far no impact on INR - changed to lactulose to better control his hepatic encephalopathy and with concern from patient/family that it was directly contributing to confusion based upon peaking of his encephalopathy consistently 30 minutes within his dose - we will ask GI to check in on him tomorrow to assure there are no other acute measures we should be considering - I have counseled the patient and his wife extensively as to the absolute need for regular ongoing follow-up with his GI physician in the outpatient setting to monitor the progression of his  disease - he is anxious to become established with the transplant center but unfortunately at this time he is just over a month 3 of alcohol and I doubt he would be a candidate for referral though we will leave this answer to GI  Chronic diastolic congestive heart failure Present volume overload appears to be oncotic/cirrhotic in nature and not due to CHF -  follow  Obesity - Body mass index is 42.43 kg/(m^2).  Code Status: FULL Family Communication: Spoke with wife at bedside at length Disposition Plan: SDU  Consultants: none  Procedures: none  Antibiotics: Ceftriaxone 5/27 > 5/29 Vanc 5/27 > 5/29  DVT prophylaxis: SCDs  Objective: Blood pressure 115/53, pulse 72, temperature 98.1 F (36.7 C), temperature source Oral, resp. rate 19, height 5\' 7"  (1.702 m), weight 122.9 kg (270 lb 15.1 oz), SpO2 95 %.  Intake/Output Summary (Last 24 hours) at 11/24/14 1116 Last data filed at 11/24/14 0949  Gross per 24 hour  Intake    871 ml  Output    550 ml  Net    321 ml   Exam: General: No acute respiratory distress - more alert and conversant today  Lungs: Clear to auscultation bilaterally without wheezes / crackles Cardiovascular: Regular rate and rhythm without murmur gallop or rub normal S1 and S2 Abdomen: Nontender, protuberant but stable in size, soft, bowel sounds positive, no rebound, no ascites, no appreciable mass Extremities: No significant cyanosis, clubbing;  3+ bilateral lower extremity edema   Data Reviewed: Basic Metabolic Panel:  Recent Labs Lab 11/18/14 1005 11/21/14 1430 11/22/14 0246 11/23/14 0339 11/24/14 0245  NA 134* 133* 134* 133* 135  K 3.6 3.7 3.5 3.2* 3.8  CL 104 102 106 106 107  CO2 28 23 23 24 23   GLUCOSE 91 102* 119* 100* 90  BUN 9 12 15 16 15   CREATININE 0.92 1.32* 1.20 1.50* 1.36*  CALCIUM 8.2* 8.0* 7.7* 7.7* 8.0*    CBC:  Recent Labs Lab 11/18/14 1005 11/21/14 1430 11/22/14 0246 11/23/14 0339 11/24/14 0245  WBC 2.6* 6.7 5.7 3.7* 3.2*  NEUTROABS 1.4 5.5  --   --   --   HGB 12.1* 11.9* 10.8* 10.5* 10.7*  HCT 34.5* 33.2* 30.1* 29.7* 30.1*  MCV 100.7* 97.9 97.4 98.0 97.7  PLT 38.0 Repeated and verified X2.* 33* 30* 30* 39*    Liver Function Tests:  Recent Labs Lab 11/18/14 1005 11/21/14 1430 11/22/14 0246 11/23/14 0339 11/24/14 0245  AST 51* 55* 47* 46* 47*  ALT 24 25  23 23 22   ALKPHOS 203* 104 90 85 74  BILITOT 2.0* 3.9* 2.6* 2.2* 2.4*  PROT 7.2 6.9 6.5 6.2* 6.5  ALBUMIN 2.1* 1.8* 1.6* 1.6* 1.7*    Recent Labs Lab 11/22/14 1140 11/23/14 0339 11/24/14 0245  AMMONIA 46* 62* 40*   Coags:  Recent Labs Lab 11/23/14 0339 11/24/14 0245  INR 1.77* 1.71*   Cardiac Enzymes:  Recent Labs Lab 11/22/14 1140  CKTOTAL 70    Recent Results (from the past 240 hour(s))  Blood culture (routine x 2)     Status: None (Preliminary result)   Collection Time: 11/21/14  2:30 PM  Result Value Ref Range Status   Specimen Description BLOOD ARM RIGHT  Final   Special Requests BOTTLES DRAWN AEROBIC ONLY 2CC  Final   Culture   Final           BLOOD CULTURE RECEIVED NO GROWTH TO DATE CULTURE WILL BE HELD FOR 5 DAYS BEFORE ISSUING A FINAL  NEGATIVE REPORT Performed at [facility]    Report Status PENDING  Incomplete  Blood culture (routine x 2)     Status: None (Preliminary result)   Collection Time: 11/21/14  4:04 PM  Result Value Ref Range Status   Specimen Description BLOOD LEFT ARM  Final   Special Requests BOTTLES DRAWN AEROBIC AND ANAEROBIC 5CC  Final   Culture   Final           BLOOD CULTURE RECEIVED NO GROWTH TO DATE CULTURE WILL BE HELD FOR 5 DAYS BEFORE ISSUING A FINAL NEGATIVE REPORT Performed at [facility]    Report Status PENDING  Incomplete  Urine culture     Status: None   Collection Time: 11/21/14  4:55 PM  Result Value Ref Range Status   Specimen Description URINE, RANDOM  Final   Special Requests NONE  Final   Colony Count   Final    2,000 COLONIES/ML Performed at [facility]    Culture   Final    INSIGNIFICANT GROWTH Performed at [facility]    Report Status 11/23/2014 FINAL  Final  Urine culture     Status: None   Collection Time: 11/21/14  9:33 PM  Result Value Ref Range Status   Specimen Description URINE, RANDOM  Final   Special Requests NONE  Final   Colony Count NO GROWTH Performed  at [facility]   Final   Culture NO GROWTH Performed at [facility]   Final   Report Status 11/23/2014 FINAL  Final  MRSA PCR Screening     Status: None   Collection Time: 11/21/14 10:32 PM  Result Value Ref Range Status   MRSA by PCR NEGATIVE NEGATIVE Final    Comment:        The GeneXpert MRSA Assay (FDA approved for NASAL specimens only), is one component of a comprehensive MRSA colonization surveillance program. It is not intended to diagnose MRSA infection nor to guide or monitor treatment for MRSA infections.      Studies:   Recent x-ray studies have been reviewed in detail by the Attending Physician  Scheduled Meds:  Scheduled Meds: . lactulose  30 g Oral TID  . pantoprazole  40 mg Oral Daily  . phytonadione  10 mg Oral Daily  . pneumococcal 23 valent vaccine  0.5 mL Intramuscular Tomorrow-1000  . sodium chloride  3 mL Intravenous Q12H    Time spent on care of this patient: 35 mins   Reeshemah Nazaryan T , MD   [facility] Office  [phone] Pager - Text Page per Shea Evans as per below:  On-Call/Text Page:      [email or web address]      password [email or web address]  If 7PM-7AM, please contact night-coverage [email or web address] Password [email or web address] 11/24/2014, 11:16 AM   LOS: 3 days

## 2014-11-25 ENCOUNTER — Encounter: Payer: Self-pay | Admitting: Family Medicine

## 2014-11-25 DIAGNOSIS — I10 Essential (primary) hypertension: Secondary | ICD-10-CM

## 2014-11-25 DIAGNOSIS — K7031 Alcoholic cirrhosis of liver with ascites: Secondary | ICD-10-CM

## 2014-11-25 DIAGNOSIS — E871 Hypo-osmolality and hyponatremia: Secondary | ICD-10-CM

## 2014-11-25 DIAGNOSIS — I5032 Chronic diastolic (congestive) heart failure: Secondary | ICD-10-CM

## 2014-11-25 DIAGNOSIS — A419 Sepsis, unspecified organism: Secondary | ICD-10-CM

## 2014-11-25 DIAGNOSIS — I9589 Other hypotension: Secondary | ICD-10-CM

## 2014-11-25 DIAGNOSIS — N179 Acute kidney failure, unspecified: Secondary | ICD-10-CM | POA: Diagnosis present

## 2014-11-25 LAB — COMPREHENSIVE METABOLIC PANEL
ALK PHOS: 80 U/L (ref 38–126)
ALT: 24 U/L (ref 17–63)
AST: 52 U/L — AB (ref 15–41)
Albumin: 1.9 g/dL — ABNORMAL LOW (ref 3.5–5.0)
Anion gap: 7 (ref 5–15)
BILIRUBIN TOTAL: 3.1 mg/dL — AB (ref 0.3–1.2)
BUN: 15 mg/dL (ref 6–20)
CHLORIDE: 105 mmol/L (ref 101–111)
CO2: 22 mmol/L (ref 22–32)
CREATININE: 1.36 mg/dL — AB (ref 0.61–1.24)
Calcium: 8.3 mg/dL — ABNORMAL LOW (ref 8.9–10.3)
GFR calc Af Amer: >60 mL/min (ref >60)
Glucose, Bld: 80 mg/dL (ref 65–99)
Potassium: 3.9 mmol/L (ref 3.5–5.1)
Sodium: 134 mmol/L — ABNORMAL LOW (ref 135–145)
Total Protein: 7.1 g/dL (ref 6.5–8.1)

## 2014-11-25 LAB — CBC
HCT: 32.8 % — ABNORMAL LOW (ref 39.0–52.0)
HEMOGLOBIN: 11.3 g/dL — AB (ref 13.0–17.0)
MCH: 33.9 pg (ref 26.0–34.0)
MCHC: 34.5 g/dL (ref 30.0–36.0)
MCV: 98.5 fL (ref 78.0–100.0)
PLATELETS: 49 10*3/uL — AB (ref 150–400)
RBC: 3.33 MIL/uL — AB (ref 4.22–5.81)
RDW: 15.2 % (ref 11.5–15.5)
WBC: 3.6 10*3/uL — ABNORMAL LOW (ref 4.0–10.5)

## 2014-11-25 LAB — PROTIME-INR
INR: 1.57 — ABNORMAL HIGH (ref 0.00–1.49)
PROTHROMBIN TIME: 18.8 s — AB (ref 11.6–15.2)

## 2014-11-25 MED ORDER — LACTULOSE 10 GM/15ML PO SOLN
30.0000 g | Freq: Every day | ORAL | Status: DC
  Administered 2014-11-26 – 2014-11-27 (×2): 30 g via ORAL
  Filled 2014-11-25 (×2): qty 45

## 2014-11-25 MED ORDER — FUROSEMIDE 10 MG/ML IJ SOLN
40.0000 mg | Freq: Every day | INTRAMUSCULAR | Status: DC
  Administered 2014-11-25 – 2014-11-26 (×2): 40 mg via INTRAVENOUS
  Filled 2014-11-25 (×2): qty 4

## 2014-11-25 MED ORDER — FUROSEMIDE 10 MG/ML IJ SOLN: 40.0000 mg | Freq: Every day | INTRAMUSCULAR | Status: DC

## 2014-11-25 MED ORDER — LACTULOSE 10 GM/15ML PO SOLN
30.0000 g | Freq: Three times a day (TID) | ORAL | Status: DC
  Administered 2014-11-25: 30 g via ORAL
  Filled 2014-11-25 (×2): qty 45

## 2014-11-25 MED ORDER — FUROSEMIDE 10 MG/ML IJ SOLN: 20.0000 mg | Freq: Every day | INTRAMUSCULAR | Status: DC

## 2014-11-25 NOTE — Progress Notes (Signed)
Paged and spoke with Dr. Sherral Hammers r/t lactulose admin.  "Pt" states that he "no longer has to take lactulose bec his ammonia levels are fine."  Dr. Sherral Hammers states that nursing continue to admin lactulose.

## 2014-11-25 NOTE — Clinical Documentation Improvement (Signed)
Current chart reflects "Obesity - body mass index is 42.43."  Please document in your progress notes if the patient's obesity can be further specified since BMI is over 40.   Possible Clinical Conditions: -Morbid Obesity -Obesity as currently documented in chart only -Other (please specify) -Unable to determine at present.    Thank you, Mateo Flow, RN [phone] Clinical Documentation Specialist

## 2014-11-25 NOTE — Progress Notes (Signed)
[facility] TEAM 1 - Stepdown/ICU TEAM Progress Note  Colton Porter NLG:[id number] DOB: Sep 15, 1965 DOA: 11/21/2014 PCP: Ria Bush, MD  Admit HPI / Brief Narrative: Colton Porter is a 50 y.o. WM PMHx hypertension, neuropathy, thrombocytopenia and cirrhosis due to alcohol abuse, chronic systolic heart failure   Comes to the ED after being referred by his PCP. The patient is a poor historian and is always giving vague answers, his wife who is at bedside is also given vague and confusing answers. He relates he saw his primary care doctor on Monday started him on rifaximin and went back on the day of admission due to myalgias and chills which has been progressively getting worse. At the PCPs office he had a as per patient fever and hypotension. He relates bilateral leg swelling, no sick contacts, no nausea no diarrhea.  In the ED: He was found to be mildly hypotensive got 2 L of normal saline and his blood pressure came up to 101/40, he was mildly hyponatremic with a new rising creatinine 1.3 (baseline creatinine less than 1), his lactic acid was 1.7, and his white count was 6.7 (previous blood cell count were always less than 4).  HPI/Subjective: 5/31  A/O 4, per wife patient's cognition back to baseline. Patient complains of ongoing headache since admission, was not treated with Tylenol or NSAID secondary to liver failure and acute renal failure..  Assessment/Plan: SIRS v/s Sepsis  -UA is equivocal - urine culture without significant growth - CK normal - blood cultures unrevealing - no clinical signs or x-ray signs to suggest pneumonia - discontinue antibiotics and follow clinically  -Patient currently does not meet guidelines for SIRS/sepsis.  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 + thrombocytopenia / anemia -Patient remains pancytopenic - abdominal ultrasound March 2016 confirmed cirrhotic architecture of liver with no focal hepatic abnormality as well as enlarged spleen - review of serial ultrasounds dating back to 2010 document progression from fatty liver to cirrhosis with splenomegaly - the patient informs me he has been entirely free of alcohol for 36+ days - MELD score presently 20 indicating 20% 3 month mortality - I discussed the serious nature of his current illness with him and his wife in frank clear terms again today - continue to dose with vitamin K but thus far no impact on INR - Decrease lactulose 30 gm daily -Follow-up with GI as outpatient; may discuss establishing care with transplant center at that time.  Chronic diastolic congestive heart failure -Present volume appears to be oncotic/cirrhotic in nature and not due to CHF, however diagnosis of diastolic CHF was from cardiac catheterization 06/10/2013. Considering patient's alcoholism and significant volume overload obtain echocardiogram.  Obesity - Body mass index is 42.43 kg/(m^2).    Code Status: FULL Family Communication: no family present at time of exam Disposition Plan: Discharge in next 24-72 hours    Consultants: NA  Procedure/Significant Events:    Culture 5/27 blood right/left arm NGTD 5/27 urine 2 insignificant growth 5/27 MRSA by PCR negative  Antibiotics: Ceftriaxone 5/27 > 5/29 Vanc 5/27 > 5/29   DVT prophylaxis: SCD  Devices    LINES / TUBES:      Continuous Infusions:   Objective: VITAL SIGNS: Temp: 97.9 F (36.6 C) (05/31 2023) Temp Source: Oral (05/31 2023) BP: 128/65 mmHg (05/31 2023) Pulse Rate: 77 (05/31  2023) SPO2; FIO2:   Intake/Output Summary (Last 24 hours) at 11/25/14 2054 Last data filed at 11/25/14 2000  Gross per 24 hour  Intake   1218 ml  Output   1125 ml  Net     93 ml     Exam: General:   A/O 4, NAD, No acute respiratory distress Eyes: Positive headache, negative eye pain, double vision, negative retinal hemorrhage ENT: Negative Runny nose, negative ear pain, negative tinnitus, negative gingival bleeding, Neck:  Negative scars, masses, torticollis, lymphadenopathy, JVD Lungs: Clear to auscultation bilaterally without wheezes or crackles Cardiovascular: Regular rate and rhythm without murmur gallop or rub normal S1 and S2 Abdomen: negative abdominal pain, negative dysphagia, Nontender, distended (negative ascites),  bowel sounds positive, no rebound, no ascites, no appreciable mass Extremities: No significant cyanosis, clubbing, positive edema bilateral lower extremities 2-3+ to hips Psychiatric:  Negative depression, negative anxiety, negative fatigue, negative mania  Neurologic:  Cranial nerves II through XII intact, tongue/uvula midline, all extremities muscle strength 5/5, sensation intact negative dysarthria, negative expressive aphasia, negative receptive aphasia.     Data Reviewed: Basic Metabolic Panel:  Recent Labs Lab 11/21/14 1430 11/22/14 0246 11/23/14 0339 11/24/14 0245 11/25/14 0400  NA 133* 134* 133* 135 134*  K 3.7 3.5 3.2* 3.8 3.9  CL 102 106 106 107 105  CO2 23 23 24 23 22   GLUCOSE 102* 119* 100* 90 80  BUN 12 15 16 15 15   CREATININE 1.32* 1.20 1.50* 1.36* 1.36*  CALCIUM 8.0* 7.7* 7.7* 8.0* 8.3*   Liver Function Tests:  Recent Labs Lab 11/21/14 1430 11/22/14 0246 11/23/14 0339 11/24/14 0245 11/25/14 0400  AST 55* 47* 46* 47* 52*  ALT 25 23 23 22 24   ALKPHOS 104 90 85 74 80  BILITOT 3.9* 2.6* 2.2* 2.4* 3.1*  PROT 6.9 6.5 6.2* 6.5 7.1  ALBUMIN 1.8* 1.6* 1.6* 1.7* 1.9*   No results for input(s): LIPASE, AMYLASE in the last 168  hours.  Recent Labs Lab 11/22/14 1140 11/23/14 0339 11/24/14 0245  AMMONIA 46* 62* 40*   CBC:  Recent Labs Lab 11/21/14 1430 11/22/14 0246 11/23/14 0339 11/24/14 0245 11/25/14 0400  WBC 6.7 5.7 3.7* 3.2* 3.6*  NEUTROABS 5.5  --   --   --   --   HGB 11.9* 10.8* 10.5* 10.7* 11.3*  HCT 33.2* 30.1* 29.7* 30.1* 32.8*  MCV 97.9 97.4 98.0 97.7 98.5  PLT 33* 30* 30* 39* 49*   Cardiac Enzymes:  Recent Labs Lab 11/22/14 1140  CKTOTAL 70   BNP (last 3 results) No results for input(s): BNP in the last 8760 hours.  ProBNP (last 3 results) No results for input(s): PROBNP in the last 8760 hours.  CBG: No results for input(s): GLUCAP in the last 168 hours.  Recent Results (from the past 240 hour(s))  Blood culture (routine x 2)     Status: None (Preliminary result)   Collection Time: 11/21/14  2:30 PM  Result Value Ref Range Status   Specimen Description BLOOD ARM RIGHT  Final   Special Requests BOTTLES DRAWN AEROBIC ONLY 2CC  Final   Culture   Final           BLOOD CULTURE RECEIVED NO GROWTH TO DATE CULTURE WILL BE HELD FOR  5 DAYS BEFORE ISSUING A FINAL NEGATIVE REPORT Performed at [facility]    Report Status PENDING  Incomplete  Blood culture (routine x 2)     Status: None (Preliminary result)   Collection Time: 11/21/14  4:04 PM  Result Value Ref Range Status   Specimen Description BLOOD LEFT ARM  Final   Special Requests BOTTLES DRAWN AEROBIC AND ANAEROBIC 5CC  Final   Culture   Final           BLOOD CULTURE RECEIVED NO GROWTH TO DATE CULTURE WILL BE HELD FOR 5 DAYS BEFORE ISSUING A FINAL NEGATIVE REPORT Performed at [facility]    Report Status PENDING  Incomplete  Urine culture     Status: None   Collection Time: 11/21/14  4:55 PM  Result Value Ref Range Status   Specimen Description URINE, RANDOM  Final   Special Requests NONE  Final   Colony Count   Final    2,000 COLONIES/ML Performed at [facility]    Culture   Final     INSIGNIFICANT GROWTH Performed at [facility]    Report Status 11/23/2014 FINAL  Final  Urine culture     Status: None   Collection Time: 11/21/14  9:33 PM  Result Value Ref Range Status   Specimen Description URINE, RANDOM  Final   Special Requests NONE  Final   Colony Count NO GROWTH Performed at [facility]   Final   Culture NO GROWTH Performed at [facility]   Final   Report Status 11/23/2014 FINAL  Final  MRSA PCR Screening     Status: None   Collection Time: 11/21/14 10:32 PM  Result Value Ref Range Status   MRSA by PCR NEGATIVE NEGATIVE Final    Comment:        The GeneXpert MRSA Assay (FDA approved for NASAL specimens only), is one component of a comprehensive MRSA colonization surveillance program. It is not intended to diagnose MRSA infection nor to guide or monitor treatment for MRSA infections.      Studies:  Recent x-ray studies have been reviewed in detail by the Attending Physician  Scheduled Meds:  Scheduled Meds: . furosemide  40 mg Intravenous Daily  . [START ON 11/26/2014] lactulose  30 g Oral Daily  . pantoprazole  40 mg Oral Daily  . phytonadione  10 mg Oral Daily  . pneumococcal 23 valent vaccine  0.5 mL Intramuscular Tomorrow-1000  . sodium chloride  3 mL Intravenous Q12H    Time spent on care of this patient: 40 mins   WOODS, Geraldo Docker , MD  [facility] Office  [phone] Pager [phone]  On-Call/Text Page:      [email or web address]      password [email or web address]  If 7PM-7AM, please contact night-coverage [email or web address] Password [email or web address] 11/25/2014, 8:54 PM   LOS: 4 days   Care during the described time interval was provided by me .  I have reviewed this patient's available data, including medical history, events of note, physical examination, and all test results as part of my evaluation. I have personally reviewed and interpreted all radiology studies.   Dia Crawford, MD [phone] Pager

## 2014-11-26 ENCOUNTER — Ambulatory Visit (HOSPITAL_COMMUNITY): Payer: BLUE CROSS/BLUE SHIELD

## 2014-11-26 DIAGNOSIS — I509 Heart failure, unspecified: Secondary | ICD-10-CM

## 2014-11-26 LAB — COMPREHENSIVE METABOLIC PANEL
ALT: 22 U/L (ref 17–63)
AST: 49 U/L — AB (ref 15–41)
Albumin: 1.7 g/dL — ABNORMAL LOW (ref 3.5–5.0)
Alkaline Phosphatase: 69 U/L (ref 38–126)
Anion gap: 4 — ABNORMAL LOW (ref 5–15)
BUN: 12 mg/dL (ref 6–20)
CO2: 28 mmol/L (ref 22–32)
Calcium: 8.2 mg/dL — ABNORMAL LOW (ref 8.9–10.3)
Chloride: 102 mmol/L (ref 101–111)
Creatinine, Ser: 1.3 mg/dL — ABNORMAL HIGH (ref 0.61–1.24)
GFR calc non Af Amer: >60 mL/min (ref >60)
Glucose, Bld: 94 mg/dL (ref 65–99)
Potassium: 3.8 mmol/L (ref 3.5–5.1)
Sodium: 134 mmol/L — ABNORMAL LOW (ref 135–145)
TOTAL PROTEIN: 6.1 g/dL — AB (ref 6.5–8.1)
Total Bilirubin: 2.7 mg/dL — ABNORMAL HIGH (ref 0.3–1.2)

## 2014-11-26 LAB — CBC
HCT: 28.8 % — ABNORMAL LOW (ref 39.0–52.0)
HEMOGLOBIN: 10.2 g/dL — AB (ref 13.0–17.0)
MCH: 34.6 pg — ABNORMAL HIGH (ref 26.0–34.0)
MCHC: 35.4 g/dL (ref 30.0–36.0)
MCV: 97.6 fL (ref 78.0–100.0)
Platelets: 40 10*3/uL — ABNORMAL LOW (ref 150–400)
RBC: 2.95 MIL/uL — ABNORMAL LOW (ref 4.22–5.81)
RDW: 14.7 % (ref 11.5–15.5)
WBC: 3.4 10*3/uL — ABNORMAL LOW (ref 4.0–10.5)

## 2014-11-26 LAB — AMMONIA: Ammonia: 22 umol/L (ref 9–35)

## 2014-11-26 LAB — PROTIME-INR
INR: 1.6 — ABNORMAL HIGH (ref 0.00–1.49)
PROTHROMBIN TIME: 19.1 s — AB (ref 11.6–15.2)

## 2014-11-26 MED ORDER — SPIRONOLACTONE 50 MG PO TABS
50.0000 mg | ORAL_TABLET | Freq: Two times a day (BID) | ORAL | Status: DC
  Administered 2014-11-26 – 2014-11-29 (×6): 50 mg via ORAL
  Filled 2014-11-26 (×9): qty 1

## 2014-11-26 MED ORDER — FUROSEMIDE 10 MG/ML IJ SOLN
40.0000 mg | Freq: Two times a day (BID) | INTRAMUSCULAR | Status: DC
  Administered 2014-11-26 – 2014-11-29 (×6): 40 mg via INTRAVENOUS
  Filled 2014-11-26 (×8): qty 4

## 2014-11-26 MED ORDER — FUROSEMIDE 40 MG PO TABS: 40.0000 mg | ORAL_TABLET | Freq: Every day | ORAL | Status: DC

## 2014-11-26 NOTE — Progress Notes (Signed)
  Echocardiogram 2D Echocardiogram has been performed.  Jennette Dubin 11/26/2014, 1:37 PM

## 2014-11-26 NOTE — Progress Notes (Signed)
Cardiac monitor discontinued, CCMD notified.

## 2014-11-26 NOTE — Progress Notes (Signed)
[facility] TEAM 1 - Stepdown/ICU TEAM Progress Note  Colton Porter IEP:[id number] DOB: 1966-03-14 DOA: 11/21/2014 PCP: Ria Bush, MD  Admit HPI / Brief Narrative: 49 y.o. male with history of hypertension, neuropathy, thrombocytopenia and cirrhosis due to alcohol abuse, and chronic systolic heart failure who came to the ED after being referred by his PCP.  His primary care doctor earlier in the week started him on rifaximin.  He went back to the PCP's office the day of admission due to myalgias and chills which had been progressively getting worse. At the PCPs office he had a fever and was hypotensive.   In the ED he was found to be mildly hypotensive got 2 L of normal saline and his blood pressure came up to 101/40, he was mildly hyponatremic with a rising creatinine 1.3 (baseline creatinine less than 1), his lactic acid was 1.7, and his white count was 6.7.  HPI/Subjective: The patient is not confused today.  He denies chest pain shortness of breath fevers chills nausea vomiting or abdominal pain.  He continues to be bothered by lower extremity edema.  Assessment/Plan:  SIRS UA is equivocal - urine culture without significant growth - CK normal - blood cultures unrevealing - no clinical signs or x-ray signs to suggest pneumonia - discontinue antibiotics and follow clinically - suspect his SIRS picture is due rather to intravascular volume depletion due to low oncotic pressure  Hypotension Blood pressure appears to have stabilized at this time - patient has third spaced resuscitated volume - presently tolerating attempts at diuresis - continue same and follow  Acute kidney injury Creatinine appears to be stabilizing for now - we are in a tough position with intravascular volume depletion but total body volume overload - this appears to primarily be an oncotic issue - increase attempts at diuresis and follow renal function  Hyponatremia  Sodium has improved - follow trend - resume  Aldactone  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 + thrombocytopenia / anemia Patient remains pancytopenic - abdominal ultrasound March 2016 confirmed cirrhotic architecture of liver with no focal hepatic abnormality as well as enlarged spleen - review of serial ultrasounds dating back to 2010 document progression from fatty liver to cirrhosis with splenomegaly - the patient informs me he has been entirely free of alcohol for 36+ days - MELD score presently 20 indicating 20% 3 month mortality - I discussed the serious nature of his current illness with him and his wife in frank clear terms yet again today - continue to dose with vitamin K but thus far no impact on INR - changed to lactulose to better control his hepatic encephalopathy and with concern from patient/family that it was directly contributing to confusion based upon peaking of his encephalopathy consistently 30 minutes within his dose - agree given stability at this time that outpatient early GI follow-up is reasonable - I have counseled the patient and his wife extensively as to the absolute need for regular ongoing follow-up with his GI physician in the outpatient setting to monitor the progression of his disease - he is anxious to become established with the transplant center but unfortunately at this time he is just over a month off alcohol and I doubt he would be a candidate for referral though we will leave this answer to GI  Chronic diastolic congestive heart failure Present volume overload appears to be oncotic/cirrhotic in nature and not due to CHF - follow with diuresis as discussed above  Morbid Obesity -  Body mass index is 41.91 kg/(m^2).  Code Status: FULL Family Communication: Spoke with wife and son at bedside at length Disposition Plan: med bed - continue attempts to diurese - follow-up ammonia - follow-up renal function - possible discharge next 24-48 hours  Consultants: none  Procedures: 6/1 TTE  > EF 60-65% - no WMA   Antibiotics: Ceftriaxone 5/27 > 5/29 Vanc 5/27 > 5/29  DVT prophylaxis: SCDs  Objective: Blood pressure 113/63, pulse 77, temperature 98 F (36.7 C), temperature source Oral, resp. rate 18, height 5\' 7"  (1.702 m), weight 121.4 kg (267 lb 10.2 oz), SpO2 94 %.  Intake/Output Summary (Last 24 hours) at 11/26/14 1152 Last data filed at 11/26/14 0254  Gross per 24 hour  Intake   1813 ml  Output   1875 ml  Net    -62 ml   Exam: General: No acute respiratory distress - alert and conversant Lungs: Clear to auscultation bilaterally without wheezes / crackles Cardiovascular: Regular rate and rhythm without murmur gallop or rub  Abdomen: Nontender, protuberant but stable in size, soft, bowel sounds positive, no rebound, no ascites, no appreciable mass Extremities: No significant cyanosis, clubbing;  2+ bilateral lower extremity edema w/ 3+ pedal edema  Data Reviewed: Basic Metabolic Panel:  Recent Labs Lab 11/22/14 0246 11/23/14 0339 11/24/14 0245 11/25/14 0400 11/26/14 0515  NA 134* 133* 135 134* 134*  K 3.5 3.2* 3.8 3.9 3.8  CL 106 106 107 105 102  CO2 23 24 23 22 28   GLUCOSE 119* 100* 90 80 94  BUN 15 16 15 15 12   CREATININE 1.20 1.50* 1.36* 1.36* 1.30*  CALCIUM 7.7* 7.7* 8.0* 8.3* 8.2*    CBC:  Recent Labs Lab 11/21/14 1430 11/22/14 0246 11/23/14 0339 11/24/14 0245 11/25/14 0400 11/26/14 0515  WBC 6.7 5.7 3.7* 3.2* 3.6* 3.4*  NEUTROABS 5.5  --   --   --   --   --   HGB 11.9* 10.8* 10.5* 10.7* 11.3* 10.2*  HCT 33.2* 30.1* 29.7* 30.1* 32.8* 28.8*  MCV 97.9 97.4 98.0 97.7 98.5 97.6  PLT 33* 30* 30* 39* 49* 40*    Liver Function Tests:  Recent Labs Lab 11/22/14 0246 11/23/14 0339 11/24/14 0245 11/25/14 0400 11/26/14 0515  AST 47* 46* 47* 52* 49*  ALT 23 23 22 24 22   ALKPHOS 90 85 74 80 69  BILITOT 2.6* 2.2* 2.4* 3.1* 2.7*  PROT 6.5 6.2* 6.5 7.1 6.1*  ALBUMIN 1.6* 1.6* 1.7* 1.9* 1.7*    Recent Labs Lab 11/22/14 1140  11/23/14 0339 11/24/14 0245 11/26/14 0515  AMMONIA 46* 62* 40* 22   Coags:  Recent Labs Lab 11/23/14 0339 11/24/14 0245 11/25/14 0400 11/26/14 0515  INR 1.77* 1.71* 1.57* 1.60*   Cardiac Enzymes:  Recent Labs Lab 11/22/14 1140  CKTOTAL 70    Recent Results (from the past 240 hour(s))  Blood culture (routine x 2)     Status: None (Preliminary result)   Collection Time: 11/21/14  2:30 PM  Result Value Ref Range Status   Specimen Description BLOOD ARM RIGHT  Final   Special Requests BOTTLES DRAWN AEROBIC ONLY 2CC  Final   Culture   Final           BLOOD CULTURE RECEIVED NO GROWTH TO DATE CULTURE WILL BE HELD FOR 5 DAYS BEFORE ISSUING A FINAL NEGATIVE REPORT Performed at [facility]    Report Status PENDING  Incomplete  Blood culture (routine x 2)     Status: None (Preliminary  result)   Collection Time: 11/21/14  4:04 PM  Result Value Ref Range Status   Specimen Description BLOOD LEFT ARM  Final   Special Requests BOTTLES DRAWN AEROBIC AND ANAEROBIC 5CC  Final   Culture   Final           BLOOD CULTURE RECEIVED NO GROWTH TO DATE CULTURE WILL BE HELD FOR 5 DAYS BEFORE ISSUING A FINAL NEGATIVE REPORT Performed at [facility]    Report Status PENDING  Incomplete  Urine culture     Status: None   Collection Time: 11/21/14  4:55 PM  Result Value Ref Range Status   Specimen Description URINE, RANDOM  Final   Special Requests NONE  Final   Colony Count   Final    2,000 COLONIES/ML Performed at [facility]    Culture   Final    INSIGNIFICANT GROWTH Performed at [facility]    Report Status 11/23/2014 FINAL  Final  Urine culture     Status: None   Collection Time: 11/21/14  9:33 PM  Result Value Ref Range Status   Specimen Description URINE, RANDOM  Final   Special Requests NONE  Final   Colony Count NO GROWTH Performed at [facility]   Final   Culture NO GROWTH Performed at [facility]   Final   Report  Status 11/23/2014 FINAL  Final  MRSA PCR Screening     Status: None   Collection Time: 11/21/14 10:32 PM  Result Value Ref Range Status   MRSA by PCR NEGATIVE NEGATIVE Final    Comment:        The GeneXpert MRSA Assay (FDA approved for NASAL specimens only), is one component of a comprehensive MRSA colonization surveillance program. It is not intended to diagnose MRSA infection nor to guide or monitor treatment for MRSA infections.      Studies:   Recent x-ray studies have been reviewed in detail by the Attending Physician  Scheduled Meds:  Scheduled Meds: . furosemide  40 mg Intravenous Daily  . lactulose  30 g Oral Daily  . pantoprazole  40 mg Oral Daily  . phytonadione  10 mg Oral Daily  . pneumococcal 23 valent vaccine  0.5 mL Intramuscular Tomorrow-1000  . sodium chloride  3 mL Intravenous Q12H    Time spent on care of this patient: 35 mins   Tiffinie Caillier T , MD   [facility] Office  [phone] Pager - Text Page per Shea Evans as per below:  On-Call/Text Page:      [email or web address]      password [email or web address]  If 7PM-7AM, please contact night-coverage [email or web address] Password [email or web address] 11/26/2014, 11:52 AM   LOS: 5 days

## 2014-11-26 NOTE — Telephone Encounter (Signed)
See email. Pt has to go though his job to get copy of these forms to bring to [name] right?

## 2014-11-27 ENCOUNTER — Telehealth: Payer: Self-pay | Admitting: Family Medicine

## 2014-11-27 DIAGNOSIS — E876 Hypokalemia: Secondary | ICD-10-CM | POA: Diagnosis present

## 2014-11-27 DIAGNOSIS — R7989 Other specified abnormal findings of blood chemistry: Secondary | ICD-10-CM

## 2014-11-27 DIAGNOSIS — K7682 Hepatic encephalopathy: Secondary | ICD-10-CM | POA: Diagnosis present

## 2014-11-27 DIAGNOSIS — K7290 Hepatic failure, unspecified without coma: Secondary | ICD-10-CM | POA: Diagnosis present

## 2014-11-27 LAB — COMPREHENSIVE METABOLIC PANEL
ALT: 22 U/L (ref 17–63)
AST: 52 U/L — ABNORMAL HIGH (ref 15–41)
Albumin: 1.8 g/dL — ABNORMAL LOW (ref 3.5–5.0)
Alkaline Phosphatase: 90 U/L (ref 38–126)
Anion gap: 7 (ref 5–15)
BUN: 13 mg/dL (ref 6–20)
CO2: 31 mmol/L (ref 22–32)
Calcium: 8.5 mg/dL — ABNORMAL LOW (ref 8.9–10.3)
Chloride: 97 mmol/L — ABNORMAL LOW (ref 101–111)
Creatinine, Ser: 1.33 mg/dL — ABNORMAL HIGH (ref 0.61–1.24)
GFR calc non Af Amer: >60 mL/min (ref >60)
GLUCOSE: 84 mg/dL (ref 65–99)
Potassium: 3.4 mmol/L — ABNORMAL LOW (ref 3.5–5.1)
SODIUM: 135 mmol/L (ref 135–145)
Total Bilirubin: 2.5 mg/dL — ABNORMAL HIGH (ref 0.3–1.2)
Total Protein: 7 g/dL (ref 6.5–8.1)

## 2014-11-27 LAB — CBC
HEMATOCRIT: 31 % — AB (ref 39.0–52.0)
Hemoglobin: 10.9 g/dL — ABNORMAL LOW (ref 13.0–17.0)
MCH: 34.2 pg — ABNORMAL HIGH (ref 26.0–34.0)
MCHC: 35.2 g/dL (ref 30.0–36.0)
MCV: 97.2 fL (ref 78.0–100.0)
Platelets: 42 10*3/uL — ABNORMAL LOW (ref 150–400)
RBC: 3.19 MIL/uL — ABNORMAL LOW (ref 4.22–5.81)
RDW: 14.6 % (ref 11.5–15.5)
WBC: 3.5 10*3/uL — ABNORMAL LOW (ref 4.0–10.5)

## 2014-11-27 LAB — POTASSIUM: POTASSIUM: 4 mmol/L (ref 3.5–5.1)

## 2014-11-27 LAB — PROTIME-INR
INR: 1.7 — AB (ref 0.00–1.49)
Prothrombin Time: 20 seconds — ABNORMAL HIGH (ref 11.6–15.2)

## 2014-11-27 LAB — MAGNESIUM: Magnesium: 1.2 mg/dL — ABNORMAL LOW (ref 1.7–2.4)

## 2014-11-27 LAB — AMMONIA: Ammonia: 104 umol/L — ABNORMAL HIGH (ref 9–35)

## 2014-11-27 MED ORDER — LACTULOSE 10 GM/15ML PO SOLN
30.0000 g | Freq: Three times a day (TID) | ORAL | Status: DC
  Filled 2014-11-27 (×2): qty 45

## 2014-11-27 MED ORDER — LACTULOSE 10 GM/15ML PO SOLN
30.0000 g | Freq: Two times a day (BID) | ORAL | Status: DC
  Administered 2014-11-27 – 2014-11-29 (×4): 30 g via ORAL
  Filled 2014-11-27 (×5): qty 45

## 2014-11-27 MED ORDER — MAGNESIUM SULFATE 2 GM/50ML IV SOLN
2.0000 g | Freq: Once | INTRAVENOUS | Status: AC
  Administered 2014-11-27: 2 g via INTRAVENOUS
  Filled 2014-11-27: qty 50

## 2014-11-27 NOTE — Telephone Encounter (Signed)
Patient's sister called to find out if the handicapped placard can be changed from temporary to permanent.  Patient's at [facility] with liver failure.

## 2014-11-27 NOTE — Progress Notes (Addendum)
[facility] TEAM 1 - Stepdown/ICU TEAM Progress Note  Colton Porter [id number] DOB: May 19, 1966 DOA: 11/21/2014 PCP: Ria Bush, MD  Admit HPI / Brief Narrative: Colton Porter is a 49 y.o. WM PMHx hypertension, neuropathy, thrombocytopenia and cirrhosis due to alcohol abuse, chronic systolic heart failure   Comes to the ED after being referred by his PCP. The patient is a poor historian and is always giving vague answers, his wife who is at bedside is also given vague and confusing answers. He relates he saw his primary care doctor on Monday started him on rifaximin and went back on the day of admission due to myalgias and chills which has been progressively getting worse. At the PCPs office he had a as per patient fever and hypotension. He relates bilateral leg swelling, no sick contacts, no nausea no diarrhea.  In the ED: He was found to be mildly hypotensive got 2 L of normal saline and his blood pressure came up to 101/40, he was mildly hyponatremic with a new rising creatinine 1.3 (baseline creatinine less than 1), his lactic acid was 1.7, and his white count was 6.7 (previous blood cell count were always less than 4).  HPI/Subjective: 6/2  A/O 4, NAD, other than the patient across the hall Him up overnight.  Assessment/Plan: SIRS v/s Sepsis  -UA is equivocal - urine culture without significant growth - CK normal - blood cultures unrevealing - no clinical signs or x-ray signs to suggest pneumonia - discontinue antibiotics and follow clinically  -Patient currently does not meet guidelines for SIRS/sepsis.  Hypotension -Blood pressure stable -Continue gentle diuresis,Continue Lasix; 40 mg BID -Strict in and out; since admission + 1.5 L  Acute kidney injury (baseline Cr ~0.8)  -Creatinine appears to be stable. 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 + thrombocytopenia / anemia/ -Patient remains pancytopenic - abdominal ultrasound March 2016 confirmed cirrhotic architecture of liver with no focal hepatic abnormality as well as enlarged spleen - review of serial ultrasounds dating back to 2010 document progression from fatty liver to cirrhosis with splenomegaly - the patient informs me he has been entirely free of alcohol for 36+ days - MELD score presently 20 indicating 20% 3 month mortality - I discussed the serious nature of his current illness with him and his wife in frank clear terms again today - continue to dose with vitamin K but thus far no impact on INR -Awaiting PT/OT recommendation -Follow-up with GI as outpatient; may discuss establishing care with transplant center at that time.  Increased ammonia level -Ammonia has trended up to 102, Increase lactulose 30 gm BID  Chronic diastolic congestive heart failure -Present volume appears to be oncotic/cirrhotic in nature and not due to CHF, however diagnosis of diastolic CHF was from cardiac catheterization 06/10/2013. Considering patient's alcoholism and significant volume overload obtain echocardiogram. -Echocardiogram showed patient to have retained EF and only mildly dilated left/right atrium  Obesity - Body mass index is 42.43 kg/(m^2).     Code Status: FULL Family Communication: Spoke with wife over the phone.  Disposition Plan: Discharge in next 24-72 hours    Consultants: NA  Procedure/Significant Events: 6/1 echocardiogram;- LVEF=60% to 65%. -  Left/ Right atrium: mildly dilated.    Culture 5/27 blood right/left arm NGTD 5/27 urine 2 insignificant growth 5/27 MRSA by PCR negative  Antibiotics: Ceftriaxone 5/27 >  5/29 Vanc 5/27 > 5/29   DVT prophylaxis: SCD   Devices    LINES / TUBES:      Continuous Infusions:   Objective: VITAL SIGNS: Temp: 97.6 F (36.4 C) (06/02 1524) Temp Source: Oral (06/02 1524) BP: 119/63 mmHg (06/02 1524) Pulse Rate: 78 (06/02 1524) SPO2; FIO2:   Intake/Output Summary (Last 24 hours) at 11/27/14 1953 Last data filed at 11/27/14 1747  Gross per 24 hour  Intake   1060 ml  Output   3701 ml  Net  -2641 ml     Exam: General:   A/O 4, NAD, No acute respiratory distress Eyes: Positive headache, negative eye pain, double vision, negative retinal hemorrhage ENT: Negative Runny nose, negative ear pain, negative tinnitus, negative gingival bleeding, Neck:  Negative scars, masses, torticollis, lymphadenopathy, JVD Lungs: Clear to auscultation bilaterally without wheezes or crackles Cardiovascular: Regular rate and rhythm without murmur gallop or rub normal S1 and S2 Abdomen: negative abdominal pain, negative dysphagia, Nontender, distended (negative ascites),  bowel sounds positive, no rebound, no ascites, no appreciable mass Extremities: No significant cyanosis, clubbing, positive edema bilateral lower extremities 2-3+ to hips Psychiatric:  Negative depression, negative anxiety, negative fatigue, negative mania  Neurologic:  Cranial nerves II through XII intact, tongue/uvula midline, all extremities muscle strength 5/5, sensation intact negative dysarthria, negative expressive aphasia, negative receptive aphasia.     Data Reviewed: Basic Metabolic Panel:  Recent Labs Lab 11/23/14 0339 11/24/14 0245 11/25/14 0400 11/26/14 0515 11/27/14 0553 11/27/14 1534  NA 133* 135 134* 134* 135  --   K 3.2* 3.8 3.9 3.8 3.4* 4.0  CL 106 107 105 102 97*  --   CO2 24 23 22 28 31   --   GLUCOSE 100* 90 80 94 84  --   BUN 16 15 15 12 13   --   CREATININE 1.50* 1.36* 1.36* 1.30* 1.33*  --   CALCIUM 7.7* 8.0* 8.3* 8.2* 8.5*  --   MG  --   --   --   --   --  1.2*    Liver Function Tests:  Recent Labs Lab 11/23/14 0339 11/24/14 0245 11/25/14 0400 11/26/14 0515 11/27/14 0553  AST 46* 47* 52* 49* 52*  ALT 23 22 24 22 22   ALKPHOS 85 74 80 69 90  BILITOT 2.2* 2.4* 3.1* 2.7* 2.5*  PROT 6.2* 6.5 7.1 6.1* 7.0  ALBUMIN 1.6* 1.7* 1.9* 1.7* 1.8*   No results for input(s): LIPASE, AMYLASE in the last 168 hours.  Recent Labs Lab 11/22/14 1140 11/23/14 0339 11/24/14 0245 11/26/14 0515 11/27/14 0553  AMMONIA 46* 62* 40* 22 104*   CBC:  Recent Labs Lab 11/21/14 1430  11/23/14 0339 11/24/14 0245 11/25/14 0400 11/26/14 0515 11/27/14 0553  WBC 6.7  < > 3.7* 3.2* 3.6* 3.4* 3.5*  NEUTROABS 5.5  --   --   --   --   --   --   HGB 11.9*  < > 10.5* 10.7* 11.3* 10.2* 10.9*  HCT 33.2*  < > 29.7* 30.1* 32.8* 28.8* 31.0*  MCV 97.9  < > 98.0 97.7 98.5 97.6 97.2  PLT 33*  < > 30* 39* 49* 40* 42*  < > = values in this interval not displayed. Cardiac Enzymes:  Recent Labs Lab 11/22/14 1140  CKTOTAL 70   BNP (last  3 results) No results for input(s): BNP in the last 8760 hours.  ProBNP (last 3 results) No results for input(s): PROBNP in the last 8760 hours.  CBG: No results for input(s): GLUCAP in the last 168 hours.  Recent Results (from the past 240 hour(s))  Blood culture (routine x 2)     Status: None (Preliminary result)   Collection Time: 11/21/14  2:30 PM  Result Value Ref Range Status   Specimen Description BLOOD ARM RIGHT  Final   Special Requests BOTTLES DRAWN AEROBIC ONLY 2CC  Final   Culture   Final           BLOOD CULTURE RECEIVED NO GROWTH TO DATE CULTURE WILL BE HELD FOR 5 DAYS BEFORE ISSUING A FINAL NEGATIVE REPORT Performed at [facility]    Report Status PENDING  Incomplete  Blood culture (routine x 2)     Status: None (Preliminary result)   Collection Time: 11/21/14  4:04 PM  Result Value Ref Range Status   Specimen Description BLOOD LEFT ARM  Final   Special Requests BOTTLES DRAWN AEROBIC AND ANAEROBIC 5CC   Final   Culture   Final           BLOOD CULTURE RECEIVED NO GROWTH TO DATE CULTURE WILL BE HELD FOR 5 DAYS BEFORE ISSUING A FINAL NEGATIVE REPORT Performed at [facility]    Report Status PENDING  Incomplete  Urine culture     Status: None   Collection Time: 11/21/14  4:55 PM  Result Value Ref Range Status   Specimen Description URINE, RANDOM  Final   Special Requests NONE  Final   Colony Count   Final    2,000 COLONIES/ML Performed at [facility]    Culture   Final    INSIGNIFICANT GROWTH Performed at [facility]    Report Status 11/23/2014 FINAL  Final  Urine culture     Status: None   Collection Time: 11/21/14  9:33 PM  Result Value Ref Range Status   Specimen Description URINE, RANDOM  Final   Special Requests NONE  Final   Colony Count NO GROWTH Performed at [facility]   Final   Culture NO GROWTH Performed at [facility]   Final   Report Status 11/23/2014 FINAL  Final  MRSA PCR Screening     Status: None   Collection Time: 11/21/14 10:32 PM  Result Value Ref Range Status   MRSA by PCR NEGATIVE NEGATIVE Final    Comment:        The GeneXpert MRSA Assay (FDA approved for NASAL specimens only), is one component of a comprehensive MRSA colonization surveillance program. It is not intended to diagnose MRSA infection nor to guide or monitor treatment for MRSA infections.      Studies:  Recent x-ray studies have been reviewed in detail by the Attending Physician  Scheduled Meds:  Scheduled Meds: . furosemide  40 mg Intravenous Q12H  . lactulose  30 g Oral BID  . pantoprazole  40 mg Oral Daily  . phytonadione  10 mg Oral Daily  . pneumococcal 23 valent vaccine  0.5 mL Intramuscular Tomorrow-1000  . spironolactone  50 mg Oral BID    Time spent on care of this patient: 40 mins   WOODS, Geraldo Docker , MD  [facility] Office  [phone] Pager - [phone]  On-Call/Text Page:      [email or web address]       password [email or web address]  If 7PM-7AM, please  contact night-coverage [email or web address] Password [email or web address] 11/27/2014, 7:53 PM   LOS: 6 days   Care during the described time interval was provided by me .  I have reviewed this patient's available data, including medical history, events of note, physical examination, and all test results as part of my evaluation. I have personally reviewed and interpreted all radiology studies.   Dia Crawford, MD [phone] Pager

## 2014-11-27 NOTE — Telephone Encounter (Signed)
That is correct. If he doesn't have STD through his work, then he will need to just file with the government. Either way, he will be responsible for getting the forms to you and it isn't a quick process unfortunately with the government.

## 2014-11-27 NOTE — Telephone Encounter (Signed)
Ok to do this 

## 2014-11-27 NOTE — Telephone Encounter (Signed)
In your IN box

## 2014-11-27 NOTE — Evaluation (Signed)
Physical Therapy Evaluation Patient Details Name: Colton Porter MRN: [id number] DOB: 17-Jul-1965 Today's Date: 11/27/2014   History of Present Illness  Patient is a 49 yo male admitted 11/21/14 with myalgias, rigors. Patient with SIRS, total body fluid overload, hypotension.  PMH:  HTN, neuropathy, cirrhosis due to ETOH abuse, CHF, OSA with CPAP, Obesity  Clinical Impression  Patient is independent with all mobility and gait.  Good balance with gait.  No acute PT needs identified - PT will sign off.  Encouraged ambulation in hallway with family or nursing.    Follow Up Recommendations No PT follow up;Supervision - Intermittent    Equipment Recommendations  None recommended by PT    Recommendations for Other Services       Precautions / Restrictions Precautions Precautions: None Restrictions Weight Bearing Restrictions: No      Mobility  Bed Mobility Overal bed mobility: Independent                Transfers Overall transfer level: Independent Equipment used: None                Ambulation/Gait Ambulation/Gait assistance: Independent Ambulation Distance (Feet): 200 Feet Assistive device: None Gait Pattern/deviations: WFL(Within Functional Limits)   Gait velocity interpretation: at or above normal speed for age/gender General Gait Details: Patient with good gait pattern, speed, and balance  Stairs            Wheelchair Mobility    Modified Rankin (Stroke Patients Only)       Balance                 Single Leg Stance - Right Leg: 2 Single Leg Stance - Left Leg: 2     Rhomberg - Eyes Opened: 30 [place]berg - Eyes Closed: 30 High level balance activites: Direction changes;Turns;Sudden stops;Head turns (Stepping over obstacles) High Level Balance Comments: No loss of balance with high level balance activities             Pertinent Vitals/Pain Pain Assessment: 0-10 Pain Score: 4  Pain Location: Rt heel with weightbearing (heel  spur) Pain Descriptors / Indicators: Sore Pain Intervention(s): Monitored during session;Repositioned    Home Living Family/patient expects to be discharged to:: Private residence Living Arrangements: Spouse/significant other Available Help at Discharge: Family;Available 24 hours/day Type of Home: House Home Access: Stairs to enter Entrance Stairs-Rails: None Entrance Stairs-Number of Steps: 1 Home Layout: One level Home Equipment: None      Prior Function Level of Independence: Independent         Comments: Owns own Chartered certified accountant        Extremity/Trunk Assessment   Upper Extremity Assessment: Overall WFL for tasks assessed           Lower Extremity Assessment: Overall WFL for tasks assessed      Cervical / Trunk Assessment: Normal  Communication   Communication: No difficulties  Cognition Arousal/Alertness: Awake/alert Behavior During Therapy: WFL for tasks assessed/performed Overall Cognitive Status: Within Functional Limits for tasks assessed                      General Comments      Exercises        Assessment/Plan    PT Assessment Patent does not need any further PT services  PT Diagnosis Abnormality of gait   PT Problem List    PT Treatment Interventions     PT Goals (Current goals can be found in  the Care Plan section) Acute Rehab PT Goals PT Goal Formulation: All assessment and education complete, DC therapy    Frequency     Barriers to discharge        Co-evaluation               End of Session   Activity Tolerance: Patient tolerated treatment well Patient left: in bed;with call bell/phone within reach;with family/visitor present Nurse Communication: Mobility status (Encouraged ambulation in hallway)         Time: 1855-1906 PT Time Calculation (min) (ACUTE ONLY): 11 min   Charges:   PT Evaluation $Initial PT Evaluation Tier I: 1 Procedure     PT G CodesDespina Pole 2014/12/12, 8:18 PM Carita Pian. Sanjuana Kava, [place] Pager [phone]

## 2014-11-28 LAB — CBC
HCT: 30.4 % — ABNORMAL LOW (ref 39.0–52.0)
Hemoglobin: 10.8 g/dL — ABNORMAL LOW (ref 13.0–17.0)
MCH: 34.5 pg — AB (ref 26.0–34.0)
MCHC: 35.5 g/dL (ref 30.0–36.0)
MCV: 97.1 fL (ref 78.0–100.0)
Platelets: 47 10*3/uL — ABNORMAL LOW (ref 150–400)
RBC: 3.13 MIL/uL — AB (ref 4.22–5.81)
RDW: 14.5 % (ref 11.5–15.5)
WBC: 3.2 10*3/uL — AB (ref 4.0–10.5)

## 2014-11-28 LAB — COMPREHENSIVE METABOLIC PANEL
ALBUMIN: 1.8 g/dL — AB (ref 3.5–5.0)
ALK PHOS: 77 U/L (ref 38–126)
ALT: 21 U/L (ref 17–63)
AST: 49 U/L — AB (ref 15–41)
Anion gap: 8 (ref 5–15)
BUN: 9 mg/dL (ref 6–20)
CO2: 31 mmol/L (ref 22–32)
CREATININE: 1.35 mg/dL — AB (ref 0.61–1.24)
Calcium: 8.2 mg/dL — ABNORMAL LOW (ref 8.9–10.3)
Chloride: 96 mmol/L — ABNORMAL LOW (ref 101–111)
GFR calc Af Amer: >60 mL/min (ref >60)
GFR calc non Af Amer: >60 mL/min (ref >60)
GLUCOSE: 92 mg/dL (ref 65–99)
Potassium: 3.3 mmol/L — ABNORMAL LOW (ref 3.5–5.1)
Sodium: 135 mmol/L (ref 135–145)
TOTAL PROTEIN: 7 g/dL (ref 6.5–8.1)
Total Bilirubin: 2.2 mg/dL — ABNORMAL HIGH (ref 0.3–1.2)

## 2014-11-28 LAB — CULTURE, BLOOD (ROUTINE X 2)
Culture: NO GROWTH
Culture: NO GROWTH

## 2014-11-28 LAB — POTASSIUM
Potassium: 3.3 mmol/L — ABNORMAL LOW (ref 3.5–5.1)
Potassium: 3.9 mmol/L (ref 3.5–5.1)

## 2014-11-28 LAB — AMMONIA: Ammonia: 40 umol/L — ABNORMAL HIGH (ref 9–35)

## 2014-11-28 LAB — MAGNESIUM
MAGNESIUM: 1.5 mg/dL — AB (ref 1.7–2.4)
Magnesium: 1.5 mg/dL — ABNORMAL LOW (ref 1.7–2.4)
Magnesium: 1.5 mg/dL — ABNORMAL LOW (ref 1.7–2.4)

## 2014-11-28 MED ORDER — POTASSIUM CHLORIDE CRYS ER 20 MEQ PO TBCR
50.0000 meq | EXTENDED_RELEASE_TABLET | Freq: Once | ORAL | Status: AC
  Administered 2014-11-28: 50 meq via ORAL
  Filled 2014-11-28: qty 1

## 2014-11-28 MED ORDER — POTASSIUM CHLORIDE CRYS ER 20 MEQ PO TBCR
40.0000 meq | EXTENDED_RELEASE_TABLET | Freq: Every day | ORAL | Status: DC
  Filled 2014-11-28: qty 2

## 2014-11-28 MED ORDER — MAGNESIUM SULFATE 50 % IJ SOLN
3.0000 g | Freq: Once | INTRAVENOUS | Status: AC
  Administered 2014-11-28: 3 g via INTRAVENOUS
  Filled 2014-11-28: qty 6

## 2014-11-28 MED ORDER — MAGNESIUM OXIDE 400 (241.3 MG) MG PO TABS
600.0000 mg | ORAL_TABLET | Freq: Every day | ORAL | Status: DC
  Filled 2014-11-28: qty 1.5

## 2014-11-28 MED ORDER — MAGNESIUM OXIDE 400 (241.3 MG) MG PO TABS
600.0000 mg | ORAL_TABLET | Freq: Once | ORAL | Status: AC
  Administered 2014-11-28: 600 mg via ORAL
  Filled 2014-11-28: qty 1.5

## 2014-11-28 NOTE — Telephone Encounter (Signed)
Patient notified via Mychart and form placed up front for pick up.

## 2014-11-28 NOTE — Telephone Encounter (Signed)
Signed and in Kim's box. 

## 2014-11-28 NOTE — Progress Notes (Signed)
[name] - Stepdown/ICU TEAM Progress Note  Colton Porter JOA:[id number] DOB: [birth year] DOA: 11/21/2014 PCP: Ria Bush, MD  Admit HPI / Brief Narrative: Colton Porter is a 49 y.o. WM PMHx hypertension, neuropathy, thrombocytopenia and cirrhosis due to alcohol abuse, chronic systolic heart failure   Comes to the ED after being referred by his PCP. The patient is a poor historian and is always giving vague answers, his wife who is at bedside is also given vague and confusing answers. He relates he saw his primary care doctor on Monday started him on rifaximin and went back on the day of admission due to myalgias and chills which has been progressively getting worse. At the PCPs office he had a as per patient fever and hypotension. He relates bilateral leg swelling, no sick contacts, no nausea no diarrhea.  In the ED: He was found to be mildly hypotensive got 2 L of normal saline and his blood pressure came up to 101/40, he was mildly hyponatremic with a new rising creatinine 1.3 (baseline creatinine less than 1), his lactic acid was 1.7, and his white count was 6.7 (previous blood cell count were always less than 4).  HPI/Subjective: 6/3  A/O 4, NAD  Assessment/Plan: SIRS v/s Sepsis  -UA is equivocal - urine culture without significant growth - CK normal - blood cultures unrevealing - no clinical signs or x-ray signs to suggest pneumonia - discontinue antibiotics and follow clinically  -Patient currently does not meet guidelines for SIRS/sepsis.  Hypotension -Blood pressure stable -Continue gentle diuresis,Continue Lasix; 40 mg BID -Strict in and out; since admission + 1.4 L  Acute kidney injury (baseline Cr ~0.8)  -Creatinine appears to be stable. 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 + thrombocytopenia / anemia/ -Patient remains pancytopenic - abdominal ultrasound March 2016 confirmed cirrhotic architecture of liver with no focal hepatic abnormality as well as enlarged spleen - review of serial ultrasounds dating back to 2010 document progression from fatty liver to cirrhosis with splenomegaly - the patient informs me he has been entirely free of alcohol for 36+ days - MELD score presently 20 indicating 20% 3 month mortality - I discussed the serious nature of his current illness with him and his wife in frank clear terms again today - continue to dose with vitamin K but thus far no impact on INR -Awaiting PT/OT recommendation -Follow-up with GI as outpatient; may discuss establishing care with transplant center at that time.  Increased ammonia level -Ammonia has trended up to 102, Increase lactulose 30 gm BID  Chronic diastolic congestive heart failure -Present volume appears to be oncotic/cirrhotic in nature and not due to CHF, however diagnosis of diastolic CHF was from cardiac catheterization 06/10/2013. Considering patient's alcoholism and significant volume overload obtain echocardiogram. -Echocardiogram showed patient to have retained EF and only mildly dilated left/right atrium  Obesity - Body mass index is 42.43 kg/(m^2).     Code Status: FULL Family Communication: Spoke with wife and Daughter at Bedside  Disposition Plan: Discharge in Am    Consultants: NA  Procedure/Significant Events: 6/1 echocardiogram;- LVEF=60% to 65%. -  Left/ Right atrium: mildly dilated.    Culture 5/27 blood right/left arm NGTD 5/27 urine 2 insignificant growth 5/27 MRSA by PCR negative  Antibiotics: Ceftriaxone 5/27 > 5/29 Vanc 5/27 > 5/29   DVT  prophylaxis: SCD   Devices    LINES / TUBES:      Continuous Infusions:   Objective: VITAL SIGNS: Temp: 98.7 F (37.1 C) (06/03 1427) Temp Source: Oral (06/03 1427) BP: 124/52 mmHg (06/03 1427) Pulse Rate: 84 (06/03 1427) SPO2; FIO2:   Intake/Output Summary (Last 24 hours) at 11/28/14 1858 Last data filed at 11/28/14 1700  Gross per 24 hour  Intake   1250 ml  Output   2550 ml  Net  -1300 ml     Exam: General:   A/O 4, NAD, No acute respiratory distress Eyes: Positive headache, negative eye pain, double vision, negative retinal hemorrhage ENT: Negative Runny nose, negative ear pain, negative tinnitus, negative gingival bleeding, Neck:  Negative scars, masses, torticollis, lymphadenopathy, JVD Lungs: Clear to auscultation bilaterally without wheezes or crackles Cardiovascular: Regular rate and rhythm without murmur gallop or rub normal S1 and S2 Abdomen: negative abdominal pain, negative dysphagia, Nontender, distended (negative ascites),  bowel sounds positive, no rebound, no ascites, no appreciable mass Extremities: No significant cyanosis, clubbing, positive edema bilateral lower extremities 2-3+ to hips Psychiatric:  Negative depression, negative anxiety, negative fatigue, negative mania  Neurologic:  Cranial nerves II through XII intact, tongue/uvula midline, all extremities muscle strength 5/5, sensation intact negative dysarthria, negative expressive aphasia, negative receptive aphasia.     Data Reviewed: Basic Metabolic Panel:  Recent Labs Lab 11/24/14 0245 11/25/14 0400 11/26/14 0515 11/27/14 0553 11/27/14 1534 11/28/14 0522 11/28/14 1318  NA 135 134* 134* 135  --  135  --   K 3.8 3.9 3.8 3.4* 4.0 3.3* 3.3*  CL 107 105 102 97*  --  96*  --   CO2 23 22 28 31   --  31  --   GLUCOSE 90 80 94 84  --  92  --   BUN 15 15 12 13   --  9  --   CREATININE 1.36* 1.36* 1.30* 1.33*  --  1.35*  --   CALCIUM 8.0* 8.3* 8.2* 8.5*  --  8.2*  --   MG  --   --    --   --  1.2* 1.5* 1.5*   Liver Function Tests:  Recent Labs Lab 11/24/14 0245 11/25/14 0400 11/26/14 0515 11/27/14 0553 11/28/14 0522  AST 47* 52* 49* 52* 49*  ALT 22 24 22 22 21   ALKPHOS 74 80 69 90 77  BILITOT 2.4* 3.1* 2.7* 2.5* 2.2*  PROT 6.5 7.1 6.1* 7.0 7.0  ALBUMIN 1.7* 1.9* 1.7* 1.8* 1.8*   No results for input(s): LIPASE, AMYLASE in the last 168 hours.  Recent Labs Lab 11/23/14 0339 11/24/14 0245 11/26/14 0515 11/27/14 0553 11/28/14 0523  AMMONIA 62* 40* 22 104* 40*   CBC:  Recent Labs Lab 11/24/14 0245 11/25/14 0400 11/26/14 0515 11/27/14 0553 11/28/14 0522  WBC 3.2* 3.6* 3.4* 3.5* 3.2*  HGB 10.7* 11.3* 10.2* 10.9* 10.8*  HCT 30.1* 32.8* 28.8* 31.0* 30.4*  MCV 97.7 98.5 97.6 97.2 97.1  PLT 39* 49* 40* 42* 47*   Cardiac Enzymes:  Recent Labs Lab 11/22/14 1140  CKTOTAL 70   BNP (last 3 results) No results for input(s): BNP in the last 8760 hours.  ProBNP (last 3 results) No results for input(s): PROBNP in the last 8760 hours.  CBG:  No results for input(s): GLUCAP in the last 168 hours.  Recent Results (from the past 240 hour(s))  Blood culture (routine x 2)     Status: None   Collection Time: 11/21/14  2:30 PM  Result Value Ref Range Status   Specimen Description BLOOD ARM RIGHT  Final   Special Requests BOTTLES DRAWN AEROBIC ONLY 2CC  Final   Culture   Final    NO GROWTH 5 DAYS Performed at [facility]    Report Status 11/28/2014 FINAL  Final  Blood culture (routine x 2)     Status: None   Collection Time: 11/21/14  4:04 PM  Result Value Ref Range Status   Specimen Description BLOOD LEFT ARM  Final   Special Requests BOTTLES DRAWN AEROBIC AND ANAEROBIC 5CC  Final   Culture   Final    NO GROWTH 5 DAYS Performed at [facility]    Report Status 11/28/2014 FINAL  Final  Urine culture     Status: None   Collection Time: 11/21/14  4:55 PM  Result Value Ref Range Status   Specimen Description URINE, RANDOM  Final    Special Requests NONE  Final   Colony Count   Final    2,000 COLONIES/ML Performed at [facility]    Culture   Final    INSIGNIFICANT GROWTH Performed at [facility]    Report Status 11/23/2014 FINAL  Final  Urine culture     Status: None   Collection Time: 11/21/14  9:33 PM  Result Value Ref Range Status   Specimen Description URINE, RANDOM  Final   Special Requests NONE  Final   Colony Count NO GROWTH Performed at [facility]   Final   Culture NO GROWTH Performed at [facility]   Final   Report Status 11/23/2014 FINAL  Final  MRSA PCR Screening     Status: None   Collection Time: 11/21/14 10:32 PM  Result Value Ref Range Status   MRSA by PCR NEGATIVE NEGATIVE Final    Comment:        The GeneXpert MRSA Assay (FDA approved for NASAL specimens only), is one component of a comprehensive MRSA colonization surveillance program. It is not intended to diagnose MRSA infection nor to guide or monitor treatment for MRSA infections.      Studies:  Recent x-ray studies have been reviewed in detail by the Attending Physician  Scheduled Meds:  Scheduled Meds: . furosemide  40 mg Intravenous Q12H  . lactulose  30 g Oral BID  . pantoprazole  40 mg Oral Daily  . phytonadione  10 mg Oral Daily  . pneumococcal 23 valent vaccine  0.5 mL Intramuscular Tomorrow-1000  . spironolactone  50 mg Oral BID    Time spent on care of this patient: 40 mins   WOODS, Geraldo Docker , MD  [facility] Office  [phone] Pager - [phone]  On-Call/Text Page:      [email or web address]      password [email or web address]  If 7PM-7AM, please contact night-coverage [email or web address] Password [email or web address] 11/28/2014, 6:58 PM   LOS: 7 days   Care during the described time interval was provided by me .  I have reviewed this patient's available data, including medical history, events of note, physical examination, and all test results as part of my evaluation. I have personally  reviewed and interpreted all radiology studies.   Dia Crawford, MD [phone] Pager

## 2014-11-28 NOTE — Progress Notes (Signed)
OT Cancellation Note  Patient Details Name: Colton Porter MRN: [id number] DOB: Dec 20, 1965   Cancelled Treatment:    Reason Eval/Treat Not Completed: OT screened, no needs identified, will sign off.  Pt reports he is independent with ADLs and wife confirms.    Darlina Rumpf [place], OTR/L [phone]  11/28/2014, 2:15 PM

## 2014-11-29 LAB — CBC
HEMATOCRIT: 31.1 % — AB (ref 39.0–52.0)
Hemoglobin: 11 g/dL — ABNORMAL LOW (ref 13.0–17.0)
MCH: 34 pg (ref 26.0–34.0)
MCHC: 35.4 g/dL (ref 30.0–36.0)
MCV: 96 fL (ref 78.0–100.0)
PLATELETS: 54 10*3/uL — AB (ref 150–400)
RBC: 3.24 MIL/uL — ABNORMAL LOW (ref 4.22–5.81)
RDW: 14.3 % (ref 11.5–15.5)
WBC: 3.5 10*3/uL — ABNORMAL LOW (ref 4.0–10.5)

## 2014-11-29 LAB — MAGNESIUM
Magnesium: 1.8 mg/dL (ref 1.7–2.4)
Magnesium: 1.8 mg/dL (ref 1.7–2.4)

## 2014-11-29 LAB — COMPREHENSIVE METABOLIC PANEL
ALK PHOS: 79 U/L (ref 38–126)
ALT: 22 U/L (ref 17–63)
AST: 48 U/L — ABNORMAL HIGH (ref 15–41)
Albumin: 1.9 g/dL — ABNORMAL LOW (ref 3.5–5.0)
Anion gap: 8 (ref 5–15)
BUN: 10 mg/dL (ref 6–20)
CALCIUM: 8.2 mg/dL — AB (ref 8.9–10.3)
CHLORIDE: 95 mmol/L — AB (ref 101–111)
CO2: 32 mmol/L (ref 22–32)
CREATININE: 1.29 mg/dL — AB (ref 0.61–1.24)
GFR calc Af Amer: >60 mL/min (ref >60)
GFR calc non Af Amer: >60 mL/min (ref >60)
Glucose, Bld: 80 mg/dL (ref 65–99)
POTASSIUM: 3.1 mmol/L — AB (ref 3.5–5.1)
Sodium: 135 mmol/L (ref 135–145)
Total Bilirubin: 2.4 mg/dL — ABNORMAL HIGH (ref 0.3–1.2)
Total Protein: 7.3 g/dL (ref 6.5–8.1)

## 2014-11-29 LAB — POTASSIUM: POTASSIUM: 3.5 mmol/L (ref 3.5–5.1)

## 2014-11-29 LAB — AMMONIA: AMMONIA: 47 umol/L — AB (ref 9–35)

## 2014-11-29 MED ORDER — POTASSIUM CHLORIDE CRYS ER 20 MEQ PO TBCR
60.0000 meq | EXTENDED_RELEASE_TABLET | Freq: Every day | ORAL | Status: DC
  Administered 2014-11-29: 60 meq via ORAL
  Filled 2014-11-29: qty 3

## 2014-11-29 MED ORDER — MAGNESIUM OXIDE 400 (241.3 MG) MG PO TABS: 400.0000 mg | ORAL_TABLET | Freq: Two times a day (BID) | ORAL | Status: DC

## 2014-11-29 MED ORDER — LACTULOSE 10 GM/15ML PO SOLN
30.0000 g | Freq: Two times a day (BID) | ORAL | Status: DC
Start: 2014-11-29 — End: 2014-12-01

## 2014-11-29 MED ORDER — FUROSEMIDE 40 MG PO TABS: 40.0000 mg | ORAL_TABLET | Freq: Two times a day (BID) | ORAL | Status: DC

## 2014-11-29 MED ORDER — POTASSIUM CHLORIDE CRYS ER 20 MEQ PO TBCR: 40.0000 meq | EXTENDED_RELEASE_TABLET | Freq: Two times a day (BID) | ORAL | Status: DC

## 2014-11-29 MED ORDER — MAGNESIUM OXIDE 400 (241.3 MG) MG PO TABS
400.0000 mg | ORAL_TABLET | Freq: Two times a day (BID) | ORAL | Status: DC
  Administered 2014-11-29: 400 mg via ORAL
  Filled 2014-11-29 (×2): qty 1

## 2014-11-29 MED ORDER — PHYTONADIONE 5 MG PO TABS: 10.0000 mg | ORAL_TABLET | Freq: Every day | ORAL | Status: DC

## 2014-11-29 MED ORDER — POTASSIUM CHLORIDE CRYS ER 20 MEQ PO TBCR: 60.0000 meq | EXTENDED_RELEASE_TABLET | Freq: Every day | ORAL | Status: DC

## 2014-11-29 MED ORDER — POTASSIUM CHLORIDE 10 MEQ/100ML IV SOLN
10.0000 meq | INTRAVENOUS | Status: AC
  Administered 2014-11-29: 10 meq via INTRAVENOUS
  Filled 2014-11-29 (×4): qty 100

## 2014-11-29 NOTE — Discharge Summary (Signed)
Physician Discharge Summary  Colton Porter PFX:[id number] DOB: [birth year] DOA: 11/21/2014  PCP: Ria Bush, MD  Admit date: 11/21/2014 Discharge date: 11/29/2014  Time spent: 40 minutes  Recommendations for Outpatient Follow-up:   SIRS v/s Sepsis  -SIRS/sepsis physiology resolved  Hypotension -Blood pressure remains soft however patient asymptomatic  -Continue Lasix; 40 mg BID for his cirrhosis -Strict in and out; upon discharge patient -2.6L  -Follow-up with PCP within one week to titrate medication  Acute kidney injury (baseline Cr ~0.8)  -Creatinine appears to be stable. 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 + thrombocytopenia / anemia/ -Patient remains pancytopenic - abdominal ultrasound March 2016 confirmed cirrhotic architecture of liver with no focal hepatic abnormality as well as enlarged spleen - review of serial ultrasounds dating back to 2010 document progression from fatty liver to cirrhosis with splenomegaly - the patient informs me he has been entirely free of alcohol for 36+ days - MELD score presently 20 indicating 20% 3 month mortality - I discussed the serious nature of his current illness with him and his wife in frank clear terms again today - continue to dose with vitamin K but thus far no impact on INR -Follow-up with GI as outpatient; may discuss establishing care with transplant center at that time.  Increased ammonia level -Ammonia at discharge 47 umol/L  -Discharge on Lactulose 30 gm BID  Chronic diastolic congestive heart failure -Present volume appears to be oncotic/cirrhotic in nature and not due to CHF, however diagnosis of diastolic CHF was from cardiac  catheterization 06/10/2013.  -Echocardiogram showed patient to have retained EF and only mildly dilated left/right atrium    Discharge Diagnoses:  Active Problems:   Essential hypertension   Chronic diastolic heart failure   Alcoholic cirrhosis of liver with ascites   Pancytopenia, acquired   UTI (lower urinary tract infection)   Lower extremity cellulitis   Hypotension   SIRS (systemic inflammatory response syndrome)   Other specified hypotension   Acute kidney injury   Hyponatremia   Increased ammonia level   Hypomagnesemia   Hypokalemia   Discharge Condition: Stable  Diet recommendation: Heart healthy  Filed Weights   11/27/14 0445 11/28/14 0610 11/29/14 0606  Weight: 117.935 kg (260 lb) 115.622 kg (254 lb 14.4 oz) 113.445 kg (250 lb 1.6 oz)    History of present illness:  Colton Porter is a 49 y.o. WM PMHx hypertension, neuropathy, thrombocytopenia and cirrhosis due to alcohol abuse, chronic systolic heart failure   Comes to the ED after being referred by his PCP. The patient is a poor historian and is always giving vague answers, his wife who is at bedside is also given vague and confusing answers. He relates he saw his primary care doctor on Monday started him on rifaximin and went back on the day of admission due to myalgias and chills which has been progressively getting worse. At the PCPs office he had a as per patient fever and hypotension. He relates bilateral leg swelling, no sick contacts, no nausea no diarrhea.  In the ED: He was found to be mildly hypotensive got 2 L of normal saline and his blood pressure came up to 101/40, he was mildly hyponatremic with a new rising creatinine  1.3 (baseline creatinine less than 1), his lactic acid was 1.7, and his white count was 6.7 (previous blood cell count were always less than 4).  During this hospitalization patient was initially found to be neuropathic secondary to alcoholic cirrhosis of liver/liver failure. After  treating patients ammonia level patient's cognition improved to baseline. In addition patient's chronic diastolic CHF was confirmed by echocardiogram. Patient was started on a regimen of medication to control his ammonia level, and fluid buildup secondary to liver and heart failure. Patient and family were counseled extensively on need for close follow-up initially with his PCP to monitor electrolyte levels, as well as his ammonia level. Family and patient was counseled that he would need future paracentesis PRN with buildup of ascites.  Procedure/Significant Events: 6/1 echocardiogram;- LVEF=60% to 65%. - Left/ Right atrium: mildly dilated.    Culture 5/27 blood right/left arm NGTD 5/27 urine 2 insignificant growth 5/27 MRSA by PCR negative  Antibiotics: Ceftriaxone 5/27 > stopped 5/29 Vanc 5/27 > stopped 5/29   Discharge Exam: Filed Vitals:   11/28/14 2110 11/29/14 0606 11/29/14 1013 11/29/14 1358  BP: 112/55 114/52 135/72 108/58  Pulse: 81 78 83 77  Temp: 98.5 F (36.9 C) 97.8 F (36.6 C)  97.9 F (36.6 C)  TempSrc: Oral Oral  Oral  Resp: 18 18 18 20   Height:      Weight:  113.445 kg (250 lb 1.6 oz)    SpO2: 95% 98% 93% 93%    General: A/O 4, NAD, No acute respiratory distress Eyes: Positive headache, negative eye pain, double vision, negative retinal hemorrhage ENT: Negative Runny nose, negative ear pain, negative tinnitus, negative gingival bleeding, Neck: Negative scars, masses, torticollis, lymphadenopathy, JVD Lungs: Clear to auscultation bilaterally without wheezes or crackles Cardiovascular: Regular rate and rhythm without murmur gallop or rub normal S1 and S2 Abdomen: negative abdominal pain, negative dysphagia, Nontender, distended (negative ascites), bowel sounds positive, no rebound, no ascites, no appreciable mass Extremities: No significant cyanosis, clubbing, positive edema bilateral lower extremities 2-3+ to hips Psychiatric: Negative depression,  negative anxiety, negative fatigue, negative mania  Neurologic: Cranial nerves II through XII intact, tongue/uvula midline, all extremities muscle strength 5/5, sensation intact negative dysarthria, negative expressive aphasia, negative receptive aphasia.     Discharge Instructions     Medication List    ASK your doctor about these medications        ALPRAZolam 0.5 MG tablet  Commonly known as:  XANAX  Take 1 tablet (0.5 mg total) by mouth 2 (two) times daily as needed for anxiety.     carvedilol 12.5 MG tablet  Commonly known as:  COREG  Take 18.75 mg by mouth 2 (two) times daily with a meal.     doxazosin 4 MG tablet  Commonly known as:  CARDURA  Take 4 mg by mouth daily.     furosemide 40 MG tablet  Commonly known as:  LASIX  Take 1 tablet (40 mg total) by mouth daily.     gabapentin 300 MG capsule  Commonly known as:  NEURONTIN  Take 1 capsule (300 mg total) by mouth 3 (three) times daily.     ibuprofen 200 MG tablet  Commonly known as:  ADVIL,MOTRIN  Take 400 mg by mouth every 6 (six) hours as needed for moderate pain.     omeprazole 20 MG capsule  Commonly known as:  PRILOSEC  Take 20 mg by mouth daily.     rifaximin 550 MG Tabs tablet  Commonly known as:  XIFAXAN  Take 1 tablet (550 mg total) by mouth 2 (two) times daily.     sildenafil 100 MG tablet  Commonly known as:  VIAGRA  Take 0.5-1 tablets (50-100 mg total) by mouth daily as needed for erectile dysfunction.     spironolactone 50 MG tablet  Commonly known as:  ALDACTONE  Take 1 tablet (50 mg total) by mouth 2 (two) times daily.     VISINE OP  Place 1 drop into both eyes daily.       Allergies  Allergen Reactions  . Hydrochlorothiazide W-Triamterene Other (See Comments)    REACTION: dizzy, nausea  . Lisinopril Other (See Comments)    REACTION: cough, decreased libido       Follow-up Information    Follow up with Ria Bush, MD.   Specialty:  [facility]   Contact  information:   [place] [address] [phone]        The results of significant diagnostics from this hospitalization (including imaging, microbiology, ancillary and laboratory) are listed below for reference.    Significant Diagnostic Studies: Dg Chest 2 View  11/21/2014   CLINICAL DATA:  Body aches for 2 days with fever. Chronic dry cough and shortness of breath with sinus congestion.  EXAM: CHEST  2 VIEW  COMPARISON:  03/31/2014 and 06/04/2013.  FINDINGS: The heart size and mediastinal contours are normal. The lungs are clear. There is no pleural effusion or pneumothorax. No acute osseous findings are identified.  IMPRESSION: Stable chest.  No active cardiopulmonary process.   Electronically Signed   By: Richardean Sale M.D.   On: 11/21/2014 15:14    Microbiology: Recent Results (from the past 240 hour(s))  Blood culture (routine x 2)     Status: None   Collection Time: 11/21/14  2:30 PM  Result Value Ref Range Status   Specimen Description BLOOD ARM RIGHT  Final   Special Requests BOTTLES DRAWN AEROBIC ONLY 2CC  Final   Culture   Final    NO GROWTH 5 DAYS Performed at [facility]    Report Status 11/28/2014 FINAL  Final  Blood culture (routine x 2)     Status: None   Collection Time: 11/21/14  4:04 PM  Result Value Ref Range Status   Specimen Description BLOOD LEFT ARM  Final   Special Requests BOTTLES DRAWN AEROBIC AND ANAEROBIC 5CC  Final   Culture   Final    NO GROWTH 5 DAYS Performed at [facility]    Report Status 11/28/2014 FINAL  Final  Urine culture     Status: None   Collection Time: 11/21/14  4:55 PM  Result Value Ref Range Status   Specimen Description URINE, RANDOM  Final   Special Requests NONE  Final   Colony Count   Final    2,000 COLONIES/ML Performed at [facility]    Culture   Final    INSIGNIFICANT GROWTH Performed at [facility]    Report Status 11/23/2014 FINAL  Final  Urine  culture     Status: None   Collection Time: 11/21/14  9:33 PM  Result Value Ref Range Status   Specimen Description URINE, RANDOM  Final   Special Requests NONE  Final   Colony Count NO GROWTH Performed at [facility]   Final   Culture NO GROWTH Performed at [facility]   Final   Report Status 11/23/2014 FINAL  Final  MRSA PCR Screening  Status: None   Collection Time: 11/21/14 10:32 PM  Result Value Ref Range Status   MRSA by PCR NEGATIVE NEGATIVE Final    Comment:        The GeneXpert MRSA Assay (FDA approved for NASAL specimens only), is one component of a comprehensive MRSA colonization surveillance program. It is not intended to diagnose MRSA infection nor to guide or monitor treatment for MRSA infections.      Labs: Basic Metabolic Panel:  Recent Labs Lab 11/25/14 0400 11/26/14 0515 11/27/14 0553  11/28/14 0522 11/28/14 1318 11/28/14 1648 11/29/14 0352 11/29/14 1200  NA 134* 134* 135  --  135  --   --  135  --   K 3.9 3.8 3.4*  < > 3.3* 3.3* 3.9 3.1* 3.5  CL 105 102 97*  --  96*  --   --  95*  --   CO2 22 28 31   --  31  --   --  32  --   GLUCOSE 80 94 84  --  92  --   --  80  --   BUN 15 12 13   --  9  --   --  10  --   CREATININE 1.36* 1.30* 1.33*  --  1.35*  --   --  1.29*  --   CALCIUM 8.3* 8.2* 8.5*  --  8.2*  --   --  8.2*  --   MG  --   --   --   < > 1.5* 1.5* 1.5* 1.8 1.8  < > = values in this interval not displayed. Liver Function Tests:  Recent Labs Lab 11/25/14 0400 11/26/14 0515 11/27/14 0553 11/28/14 0522 11/29/14 0352  AST 52* 49* 52* 49* 48*  ALT 24 22 22 21 22   ALKPHOS 80 69 90 77 79  BILITOT 3.1* 2.7* 2.5* 2.2* 2.4*  PROT 7.1 6.1* 7.0 7.0 7.3  ALBUMIN 1.9* 1.7* 1.8* 1.8* 1.9*   No results for input(s): LIPASE, AMYLASE in the last 168 hours.  Recent Labs Lab 11/24/14 0245 11/26/14 0515 11/27/14 0553 11/28/14 0523 11/29/14 0352  AMMONIA 40* 22 104* 40* 47*   CBC:  Recent Labs Lab 11/25/14 0400  11/26/14 0515 11/27/14 0553 11/28/14 0522 11/29/14 0352  WBC 3.6* 3.4* 3.5* 3.2* 3.5*  HGB 11.3* 10.2* 10.9* 10.8* 11.0*  HCT 32.8* 28.8* 31.0* 30.4* 31.1*  MCV 98.5 97.6 97.2 97.1 96.0  PLT 49* 40* 42* 47* 54*   Cardiac Enzymes: No results for input(s): CKTOTAL, CKMB, CKMBINDEX, TROPONINI in the last 168 hours. BNP: BNP (last 3 results) No results for input(s): BNP in the last 8760 hours.  ProBNP (last 3 results) No results for input(s): PROBNP in the last 8760 hours.  CBG: No results for input(s): GLUCAP in the last 168 hours.     Signed:  Dia Crawford, MD [facility] [phone] pager

## 2014-11-29 NOTE — Progress Notes (Signed)
Patient alert oriented, no c/o pain or shortness of breath. D/C instruction explain and given to the patient, pt. Verbalized understanding. D/c patient per order.

## 2014-12-01 ENCOUNTER — Telehealth: Payer: Self-pay | Admitting: Family Medicine

## 2014-12-01 ENCOUNTER — Telehealth: Payer: Self-pay | Admitting: Gastroenterology

## 2014-12-01 MED ORDER — LACTULOSE 10 GM/15ML PO SOLN: 30.0000 g | Freq: Two times a day (BID) | ORAL | Status: DC

## 2014-12-01 NOTE — Telephone Encounter (Signed)
Pt called and stated that the PCP refilled the Lactulose and they will keep appt as scheduled

## 2014-12-01 NOTE — Addendum Note (Signed)
Addended by: Ria Bush on: 12/01/2014 02:08 PM   Modules accepted: Orders

## 2014-12-01 NOTE — Telephone Encounter (Signed)
plz schedule lab visit for tomorrow 

## 2014-12-01 NOTE — Telephone Encounter (Signed)
Can pt have a refill on lactulose?  Pt was discharged on 11/29/14 and has a follow up on 12/08/14 with Amy.  Should his appt be moved to this week?

## 2014-12-01 NOTE — Telephone Encounter (Signed)
Please see Mychart message.

## 2014-12-01 NOTE — Telephone Encounter (Signed)
plz call for hosp f/u visit Discharged on Saturday. Schedule f/u appt late this week if able Ensure he has f/u scheduled with GI.

## 2014-12-01 NOTE — Telephone Encounter (Signed)
Follow up scheduled. His appt with GI is in July. Does it need to be sooner?

## 2014-12-01 NOTE — Telephone Encounter (Signed)
No. Will see in f/u.

## 2014-12-02 ENCOUNTER — Ambulatory Visit: Payer: BLUE CROSS/BLUE SHIELD | Admitting: Family Medicine

## 2014-12-02 ENCOUNTER — Other Ambulatory Visit (INDEPENDENT_AMBULATORY_CARE_PROVIDER_SITE_OTHER): Payer: BLUE CROSS/BLUE SHIELD

## 2014-12-02 DIAGNOSIS — K7031 Alcoholic cirrhosis of liver with ascites: Secondary | ICD-10-CM | POA: Diagnosis not present

## 2014-12-02 LAB — BASIC METABOLIC PANEL
BUN: 14 mg/dL (ref 6–23)
CO2: 28 meq/L (ref 19–32)
CREATININE: 1.47 mg/dL (ref 0.40–1.50)
Calcium: 8.8 mg/dL (ref 8.4–10.5)
Chloride: 99 mEq/L (ref 96–112)
GFR: 54.29 mL/min — AB (ref >60.00)
Glucose, Bld: 95 mg/dL (ref 70–99)
POTASSIUM: 4.3 meq/L (ref 3.5–5.1)
SODIUM: 132 meq/L — AB (ref 135–145)

## 2014-12-02 LAB — AMMONIA: Ammonia: 46 umol/L (ref 16–53)

## 2014-12-02 LAB — MAGNESIUM: Magnesium: 1.6 mg/dL (ref 1.5–2.5)

## 2014-12-03 ENCOUNTER — Telehealth: Payer: Self-pay

## 2014-12-03 NOTE — Telephone Encounter (Signed)
Pt received call from [facility] but call was one min. Of silence. Pt wondered if was reminder about upcoming appt. Pt was advised has appt on 12/05/14 Fri at 12 noon with Dr Darnell Level and pt plans to keep appt.

## 2014-12-05 ENCOUNTER — Encounter: Payer: Self-pay | Admitting: Family Medicine

## 2014-12-05 ENCOUNTER — Ambulatory Visit (INDEPENDENT_AMBULATORY_CARE_PROVIDER_SITE_OTHER): Payer: BLUE CROSS/BLUE SHIELD | Admitting: Family Medicine

## 2014-12-05 VITALS — BP 124/74 | HR 92 | Temp 97.8°F | Wt 247.5 lb

## 2014-12-05 DIAGNOSIS — R600 Localized edema: Secondary | ICD-10-CM

## 2014-12-05 DIAGNOSIS — K7682 Hepatic encephalopathy: Secondary | ICD-10-CM

## 2014-12-05 DIAGNOSIS — N179 Acute kidney failure, unspecified: Secondary | ICD-10-CM

## 2014-12-05 DIAGNOSIS — K7031 Alcoholic cirrhosis of liver with ascites: Secondary | ICD-10-CM | POA: Diagnosis not present

## 2014-12-05 DIAGNOSIS — F1021 Alcohol dependence, in remission: Secondary | ICD-10-CM | POA: Diagnosis not present

## 2014-12-05 DIAGNOSIS — I10 Essential (primary) hypertension: Secondary | ICD-10-CM

## 2014-12-05 DIAGNOSIS — I5032 Chronic diastolic (congestive) heart failure: Secondary | ICD-10-CM | POA: Diagnosis not present

## 2014-12-05 DIAGNOSIS — K7290 Hepatic failure, unspecified without coma: Secondary | ICD-10-CM

## 2014-12-05 DIAGNOSIS — D61818 Other pancytopenia: Secondary | ICD-10-CM

## 2014-12-05 DIAGNOSIS — G4700 Insomnia, unspecified: Secondary | ICD-10-CM

## 2014-12-05 DIAGNOSIS — D619 Aplastic anemia, unspecified: Secondary | ICD-10-CM

## 2014-12-05 NOTE — Patient Instructions (Addendum)
Let's decrease lactulose to 17mL twice daily.  Decrease xanax use to only as needed for anxiety.  Let me know if worsening anxiety or stress for daily medication for mood.  Good to see you today, call us with questions. Keep appointment with GI.  Return in 1 month for follow up visit.

## 2014-12-05 NOTE — Progress Notes (Signed)
Pre visit review using our clinic review tool, if applicable. No additional management support is needed unless otherwise documented below in the visit note. 

## 2014-12-05 NOTE — Progress Notes (Signed)
BP 124/74 mmHg  Pulse 92  Temp(Src) 97.8 F (36.6 C) (Oral)  Wt 247 lb 8 oz (112.265 kg)   CC: hospital f/u visit  Subjective:    Patient ID: Colton Porter, male    DOB: [birth year], 49 y.o.   MRN: [id number]  HPI: Colton Porter is a 49 y.o. male presenting on 12/05/2014 for Follow-up   Samual presents today after hospitalization with sepsis/SIR. Aware of alcoholic cirrhosis diagnosis and guarded prognosis. Has f/u scheduled with GI next Monday. Wife brings FMLA forms to fill out so she can care for him and accompany him to office visits. He is applying for permanent disability 2/2 liver failure. Staying fatigued.  blcx NG UCx NG x2  Lactulose 30gm BID was started to keep ammonia levels controlled and prevent repeat hepatic encephalopathy. Having 5-6 loose stools per day. Compliant with meds including lasix and spironolactone, as well as potassium and magnesium. Interested in discussion of liver transplant.   Main concern today is persistent trouble sleeping - he has recently increased xanax use to nightly to help.   MELD score 23 today.   Admit date: 11/21/2014 Discharge date: 11/29/2014 F/u phone call: 12/01/2014  Discharge Diagnoses:  Active Problems:  Essential hypertension  Chronic diastolic heart failure  Alcoholic cirrhosis of liver with ascites  Pancytopenia, acquired  UTI (lower urinary tract infection)  Lower extremity cellulitis  Hypotension  SIRS (systemic inflammatory response syndrome)  Other specified hypotension  Acute kidney injury  Hyponatremia  Increased ammonia level  Hypomagnesemia  Hypokalemia  Relevant past medical, surgical, family and social history reviewed and updated as indicated. Interim medical history since our last visit reviewed. Allergies and medications reviewed and updated. Current Outpatient Prescriptions on File Prior to Visit  Medication Sig  . ALPRAZolam (XANAX) 0.5 MG tablet Take 1 tablet (0.5 mg total) by  mouth 2 (two) times daily as needed for anxiety.  . furosemide (LASIX) 40 MG tablet Take 1 tablet (40 mg total) by mouth 2 (two) times daily.  . magnesium oxide (MAG-OX) 400 (241.3 MG) MG tablet Take 1 tablet (400 mg total) by mouth 2 (two) times daily.  Marland Kitchen omeprazole (PRILOSEC) 20 MG capsule Take 20 mg by mouth daily.  . phytonadione (VITAMIN K) 5 MG tablet Take 2 tablets (10 mg total) by mouth daily.  . potassium chloride SA (K-DUR,KLOR-CON) 20 MEQ tablet Take 2 tablets (40 mEq total) by mouth 2 (two) times daily.  Marland Kitchen spironolactone (ALDACTONE) 50 MG tablet Take 1 tablet (50 mg total) by mouth 2 (two) times daily.  . Tetrahydrozoline HCl (VISINE OP) Place 1 drop into both eyes daily.  Marland Kitchen gabapentin (NEURONTIN) 300 MG capsule Take 1 capsule (300 mg total) by mouth 3 (three) times daily. (Patient not taking: Reported on 12/05/2014)  . ibuprofen (ADVIL,MOTRIN) 200 MG tablet Take 400 mg by mouth every 6 (six) hours as needed for moderate pain.  . sildenafil (VIAGRA) 100 MG tablet Take 0.5-1 tablets (50-100 mg total) by mouth daily as needed for erectile dysfunction. (Patient not taking: Reported on 12/05/2014)   No current facility-administered medications on file prior to visit.    Review of Systems Per HPI unless specifically indicated above     Objective:    BP 124/74 mmHg  Pulse 92  Temp(Src) 97.8 F (36.6 C) (Oral)  Wt 247 lb 8 oz (112.265 kg)  Wt Readings from Last 3 Encounters:  12/05/14 247 lb 8 oz (112.265 kg)  11/29/14 250 lb 1.6 oz (113.445 kg)  11/18/14 271 lb (122.925 kg)   Body mass index is 38.75 kg/(m^2).  Physical Exam  Constitutional: He appears well-developed and well-nourished. No distress.  Central obesity  HENT:  Mouth/Throat: Oropharynx is clear and moist. No oropharyngeal exudate.  Cardiovascular: Normal rate, regular rhythm, normal heart sounds and intact distal pulses.   No murmur heard. Pulmonary/Chest: Effort normal and breath sounds normal. No respiratory  distress. He has no wheezes. He has no rales.  Abdominal: Soft. Bowel sounds are normal. He exhibits no distension and no mass. There is no tenderness. There is no rebound and no guarding.  Mild distension without ascites  Musculoskeletal: He exhibits edema (nonpitting).  Nursing note and vitals reviewed.  Results for orders placed or performed in visit on 22/29/79  Basic metabolic panel  Result Value Ref Range   Sodium 132 (L) 135 - 145 mEq/L   Potassium 4.3 3.5 - 5.1 mEq/L   Chloride 99 96 - 112 mEq/L   CO2 28 19 - 32 mEq/L   Glucose, Bld 95 70 - 99 mg/dL   BUN 14 6 - 23 mg/dL   Creatinine, Ser 1.47 0.40 - 1.50 mg/dL   Calcium 8.8 8.4 - 10.5 mg/dL   GFR 54.29 (L) >60.00 mL/min  Magnesium  Result Value Ref Range   Magnesium 1.6 1.5 - 2.5 mg/dL  AMMONIA  Result Value Ref Range   Ammonia 46 16 - 53 umol/L      Assessment & Plan:   Problem List Items Addressed This Visit    Acute kidney injury    Latest Cr mildly impaired - will need to closely monitor this. Continue lasix and spironolactone.      Alcoholic cirrhosis of liver with ascites - Primary    Now off xifaxan - ?contributor to fatigue.  Lactulose started. Decrease to 20gm BID (59mL bid), pt and family aware of continued need to monitor mentation. Aware of serious nature of liver disease and endorses alcohol abstinence. Interested in discussion of liver transplant. STD not available. Pursuing permanent disability which I think is reasonable given MELD score and fatigue and liver function - discussed process, has already spoken with lawyer. Will appreciate GI input.      Chronic diastolic heart failure    Chronic, stable. Doubt currently active issue.      Essential hypertension    Some low blood pressures in hospital but today stable. S/p diuresis in hospital. Down 24 lbs      Hepatic encephalopathy    Now on lactulose. decrease to 20gm BID, monitor mentation.      History of alcohol dependence    Abstinent  for last 1+ month.      Hypomagnesemia    Continue magnesium oxide 400mg  BiD.      Insomnia    rec to decrease xanax use, discussed liver clearance. Last prescribed #30 on 09-03-2014 after father passed away. Discussed liver metabolism. Discussed liver metabolism of several sleeping aides, rec against any of these for now. Consider rozerem (first pass metabolism, urine clearance). Consider oxazepam if benzo required in the future. Discussed option of daily medication if anxiety/depression becoming an issue as this can affect ability to sleep.      Pancytopenia, acquired    Liver disease related. Continue to monitor.      Pedal edema    Multifactorial but predominantly liver disease related with hypoalbuminemia. Continue lasix and spironolactone.a          Follow up plan: Return in about 4 weeks (around 01/02/2015),  or as needed, for follow up visit.

## 2014-12-06 DIAGNOSIS — G4700 Insomnia, unspecified: Secondary | ICD-10-CM | POA: Insufficient documentation

## 2014-12-06 NOTE — Assessment & Plan Note (Addendum)
Now off xifaxan - ?contributor to fatigue.  Lactulose started. Decrease to 20gm BID (109mL bid), pt and family aware of continued need to monitor mentation. Aware of serious nature of liver disease and endorses alcohol abstinence. Interested in discussion of liver transplant. STD not available. Pursuing permanent disability which I think is reasonable given MELD score and fatigue and liver function - discussed process, has already spoken with lawyer. Will appreciate GI input.

## 2014-12-06 NOTE — Assessment & Plan Note (Signed)
Continue magnesium oxide 400mg  BiD.

## 2014-12-06 NOTE — Assessment & Plan Note (Signed)
Latest Cr mildly impaired - will need to closely monitor this. Continue lasix and spironolactone.

## 2014-12-06 NOTE — Assessment & Plan Note (Signed)
Abstinent for last 1+ month.

## 2014-12-06 NOTE — Assessment & Plan Note (Addendum)
rec to decrease xanax use, discussed liver clearance. Last prescribed #30 on 09/18/14 after father passed away. Discussed liver metabolism. Discussed liver metabolism of several sleeping aides, rec against any of these for now. Consider rozerem (first pass metabolism, urine clearance). Consider oxazepam if benzo required in the future. Discussed option of daily medication if anxiety/depression becoming an issue as this can affect ability to sleep.

## 2014-12-06 NOTE — Assessment & Plan Note (Signed)
Chronic, stable. Doubt currently active issue.

## 2014-12-06 NOTE — Assessment & Plan Note (Signed)
Some low blood pressures in hospital but today stable. S/p diuresis in hospital. Down 24 lbs

## 2014-12-06 NOTE — Assessment & Plan Note (Signed)
Multifactorial but predominantly liver disease related with hypoalbuminemia. Continue lasix and spironolactone.a

## 2014-12-06 NOTE — Assessment & Plan Note (Signed)
Liver disease related. Continue to monitor.

## 2014-12-06 NOTE — Assessment & Plan Note (Signed)
Now on lactulose. decrease to 20gm BID, monitor mentation.

## 2014-12-08 ENCOUNTER — Ambulatory Visit: Payer: BLUE CROSS/BLUE SHIELD | Admitting: Physician Assistant

## 2014-12-08 ENCOUNTER — Encounter: Payer: Self-pay | Admitting: Physician Assistant

## 2014-12-08 ENCOUNTER — Other Ambulatory Visit (INDEPENDENT_AMBULATORY_CARE_PROVIDER_SITE_OTHER): Payer: BLUE CROSS/BLUE SHIELD

## 2014-12-08 ENCOUNTER — Ambulatory Visit (INDEPENDENT_AMBULATORY_CARE_PROVIDER_SITE_OTHER): Payer: BLUE CROSS/BLUE SHIELD | Admitting: Physician Assistant

## 2014-12-08 VITALS — BP 130/62 | HR 84 | Ht 66.75 in | Wt 249.0 lb

## 2014-12-08 DIAGNOSIS — K7031 Alcoholic cirrhosis of liver with ascites: Secondary | ICD-10-CM

## 2014-12-08 DIAGNOSIS — R5383 Other fatigue: Secondary | ICD-10-CM

## 2014-12-08 DIAGNOSIS — K7290 Hepatic failure, unspecified without coma: Secondary | ICD-10-CM | POA: Diagnosis not present

## 2014-12-08 DIAGNOSIS — K7682 Hepatic encephalopathy: Secondary | ICD-10-CM

## 2014-12-08 LAB — BASIC METABOLIC PANEL
BUN: 22 mg/dL (ref 6–23)
CALCIUM: 9.6 mg/dL (ref 8.4–10.5)
CO2: 28 mEq/L (ref 19–32)
Chloride: 98 mEq/L (ref 96–112)
Creatinine, Ser: 1.68 mg/dL — ABNORMAL HIGH (ref 0.40–1.50)
GFR: 46.54 mL/min — AB (ref >60.00)
Glucose, Bld: 80 mg/dL (ref 70–99)
Potassium: 5.3 mEq/L — ABNORMAL HIGH (ref 3.5–5.1)
SODIUM: 128 meq/L — AB (ref 135–145)

## 2014-12-08 LAB — PROTIME-INR
INR: 1.5 ratio — ABNORMAL HIGH (ref 0.8–1.0)
Prothrombin Time: 16.6 s — ABNORMAL HIGH (ref 9.6–13.1)

## 2014-12-08 LAB — AMMONIA: AMMONIA: 34 umol/L (ref 11–35)

## 2014-12-08 NOTE — Patient Instructions (Signed)
Please go to the basement level to have your labs drawn.  Stay on a 2 gram sodium diet.   Stop Xanax, no sleeping medications. Continue Lasix 40 mg, take 2 tab each morning. Take Aldoctone 50 mg, take 2 tablets each morning.

## 2014-12-08 NOTE — Progress Notes (Signed)
Patient ID: Colton Porter, male   DOB: [birth year], 49 y.o.   MRN: [id number]   Subjective:    Patient ID: Colton Porter, male    DOB: [birth year], 49 y.o.   MRN: [id number]  HPI Colton Porter is a 49 year old white male known to Dr. Ardis Porter who was seen in our office last in March 2016 with a new diagnosis of cirrhosis. He had been referred by Dr. Danise Porter and had been started on Lasix and Aldactone. He had presented with peripheral edema and abdominal ultrasound was done revealing a cirrhotic liver splenomegaly noted that time no ascites. He had significant history of EtOH abuse over the past 10 years. He had been drinking at least 20 beers per day at the time of that office visit was down to 4-6 beers per day. He was advised to completely stop drinking any alcohol continued on diuretics. Had a hepatitis serologies were done and negative alpha-fetoprotein within normal limits he was scheduled for EGD with Dr. Ardis Porter to rule out varices this showed minimal portal gastropathy and no varices. He had a hospitalization 527 through 11/29/2014 at [facility] with confusion myalgias and questionable fever. Has also been noted to be hypotensive at the PCPs office. There was concerns for sepsis/servers however all infectious workup was negative. He was not seen by GI during his hospitalization. He did have an elevated ammonia level. He had been started on Xifaxan about a week prior to that hospitalization also for an elevated ammonia level and there was question whether he had reacted to the Xifaxan and this was discontinued. During his hospitalization he was treated with lactulose started, on lactulose. Issues during hospitalization were hypotension which corrected with volume acute kidney injury also corrected with volume no definite evidence for infection. He comes in today with his family for GI follow-up and they have multiple questions regarding his prognosis etc. He has also been unable to work due to ongoing  complaints of significant fatigue and is applying for disability. They're quite stressed about it because neither he or his wife are working at this time. His family is already asking about transplant possibilities as well. Patient's main complaint is significant fatigue. His family states that he does seem to be sleepy and lethargic at times. He had been using some Ambien on occasion and also Xanax on occasion. Apparently he had taken Ambien just prior to being hospitalized. He has not had any alcohol in about 6 weeks.  Review of Systems Pertinent positive and negative review of systems were noted in the above HPI section.  All other review of systems was otherwise negative.  Outpatient Encounter Prescriptions as of 12/08/2014  Medication Sig  . ALPRAZolam (XANAX) 0.5 MG tablet Take 1 tablet (0.5 mg total) by mouth 2 (two) times daily as needed for anxiety.  . furosemide (LASIX) 40 MG tablet Take 1 tablet (40 mg total) by mouth 2 (two) times daily.  Marland Kitchen ibuprofen (ADVIL,MOTRIN) 200 MG tablet Take 400 mg by mouth every 6 (six) hours as needed for moderate pain.  Marland Kitchen lactulose (CHRONULAC) 10 GM/15ML solution Take 30 mLs (20 g total) by mouth 2 (two) times daily.  . magnesium oxide (MAG-OX) 400 (241.3 MG) MG tablet Take 1 tablet (400 mg total) by mouth 2 (two) times daily.  Marland Kitchen omeprazole (PRILOSEC) 20 MG capsule Take 20 mg by mouth daily.  . phytonadione (VITAMIN K) 5 MG tablet Take 2 tablets (10 mg total) by mouth daily.  . potassium chloride SA (K-DUR,KLOR-CON) 20 MEQ  tablet Take 2 tablets (40 mEq total) by mouth 2 (two) times daily.  . sildenafil (VIAGRA) 100 MG tablet Take 0.5-1 tablets (50-100 mg total) by mouth daily as needed for erectile dysfunction.  Marland Kitchen spironolactone (ALDACTONE) 50 MG tablet Take 1 tablet (50 mg total) by mouth 2 (two) times daily.  . Tetrahydrozoline HCl (VISINE OP) Place 1 drop into both eyes daily.  . [DISCONTINUED] gabapentin (NEURONTIN) 300 MG capsule Take 1 capsule (300 mg  total) by mouth 3 (three) times daily.   No facility-administered encounter medications on file as of 12/08/2014.   Allergies  Allergen Reactions  . Hydrochlorothiazide W-Triamterene Other (See Comments)    REACTION: dizzy, nausea  . Lisinopril Other (See Comments)    REACTION: cough, decreased libido   Patient Active Problem List   Diagnosis Date Noted  . Insomnia 12/06/2014  . Hepatic encephalopathy 11/27/2014  . Hypomagnesemia   . Acute kidney injury   . Other fatigue 11/18/2014  . Internal hemorrhoid 10/01/2014  . Erectile dysfunction 10/01/2014  . Pancytopenia, acquired 09/10/2014  . Alcoholic cirrhosis of liver with ascites 07/28/2014  . Chronic diastolic heart failure 95/02/3266  . Essential hypertension 06/04/2013  . OSA on CPAP 07/11/2012  . History of alcohol dependence 07/11/2012  . Thrombocytopenia 12/15/2011  . Obesity, Class II, BMI 35-39.9, with comorbidity 11/16/2011  . Routine general medical examination at a health care facility 09/22/2010  . NEUROPATHY 05/27/2009  . Pedal edema 09/22/2008  . HYPERLIPIDEMIA 02/15/2007  . ALLERGIC RHINITIS 02/15/2007  . GERD 02/15/2007   History   Social History  . Marital Status: Married    Spouse Name: N/A  . Number of Children: 2  . Years of Education: N/A   Occupational History  . [employer]     no work lately   Social History Main Topics  . Smoking status: Never Smoker   . Smokeless tobacco: Current User    Types: Chew  . Alcohol Use: 0.0 oz/week    0 Standard drinks or equivalent per week     Comment: 4-6 drinks daily  . Drug Use: No  . Sexual Activity: No   Other Topics Concern  . Not on file   Social History Narrative        Colton Porter's family history includes Emphysema in his father and mother; Heart disease in his father; Hypertension in his father; Lung cancer in his maternal aunt; Stroke in his mother and paternal grandmother. There is no history of Heart attack.        Objective:    Filed Vitals:   12/08/14 1452  BP: 130/62  Pulse: 84    Physical Exam  well-developed white male in no acute distress, accompanied by his wife and sister blood pressure 130/62 pulse 84 height 5 foot 6 weight 249 down 15 pounds since last office visit March 2016. HEENT; nontraumatic normocephalic EOMI PERRLA sclera anicteric, Supple ;no JVD, Cardiovascular; regular rate and rhythm with S1-S2 no murmur or gallop, Pulmonary; clear bilaterally, Abdomen ;soft no appreciable fluid wave nontender no palpable mass or hepatosplenomegaly, Extremities; trace edema bilaterally to shins, Neuropsych; mood and affect appropriate no asterixis       Assessment & Plan:   #1 50 yo male with decompensated alcohol induced cirrhosis with peripheral edema , coagulopathy, and hepatic encephalopathy.  #2 recent admission with hypotension, AKI,? Sirs though no infection identified- pt confused on admit  C/w encephalopathy  #3 significant Fatigue- preventing pt from working - likely secondary to advanced cirrhosis #  4 OSA #5 hx CHF   Plan; long discussion with pt and family . MELD was 20 during hospital stay.  Agree with disability at this time  Advise that he needs to be abstinent from ETOH for 6 months prior to considering transplant eval, and should also have formal ETOH rehb/counselling  Leave off Xifaxan but not convinced this was cause of his sxs at time of admission  Continue Chronulac 30cc bid  Continue current doses of lasix 40 bid and aldactone 50 bid  Stop all Xanax and sleeping aids, avoid narcotics 2gm sodium diet   Check labs today   Will need regular GI follow up over next several months- appt with Dr Colton Porter in  3-4 weeks      Amy Genia Harold PA-C 12/08/2014   Cc: Ria Bush, MD

## 2014-12-08 NOTE — Progress Notes (Signed)
i agree with the above note, plan 

## 2014-12-11 ENCOUNTER — Other Ambulatory Visit: Payer: Self-pay

## 2014-12-11 ENCOUNTER — Telehealth: Payer: Self-pay | Admitting: Family Medicine

## 2014-12-11 DIAGNOSIS — K7460 Unspecified cirrhosis of liver: Secondary | ICD-10-CM

## 2014-12-11 NOTE — Telephone Encounter (Signed)
Wife's FMLA forms filled and in Kim's box.

## 2014-12-11 NOTE — Telephone Encounter (Signed)
Message left advising patient's wife and forms placed up front for pick up.

## 2014-12-15 ENCOUNTER — Telehealth: Payer: Self-pay | Admitting: Physician Assistant

## 2014-12-15 NOTE — Telephone Encounter (Signed)
Patient instructed.

## 2014-12-15 NOTE — Telephone Encounter (Signed)
Doc of the day This is a patient of Dr Ardis Hughs, last seen by Amy for a hospital follow up for hepatic encephalopathy and cirrhosis.  Patient reports going from 242# to 246#. He decreased his Lasix 40 mg BID and Aldactone 50 mg BID to one each every day. He admits to non-compliance with his diet over the weekend (2 gr Na diet). He feels bloated in the abdominal area. No extremity swelling. No SOB. Does he need to do anything different? He is scheduled for labs 12/18/14 to recheck kidney function and potassium.

## 2014-12-15 NOTE — Telephone Encounter (Signed)
Can increase Lasix to 40 mg twice a day while continuing Aldactone 50 mg daily.  He should weigh himself daily.  Call back in 3 days if weight continues to increase.

## 2014-12-18 ENCOUNTER — Other Ambulatory Visit (INDEPENDENT_AMBULATORY_CARE_PROVIDER_SITE_OTHER): Payer: BLUE CROSS/BLUE SHIELD

## 2014-12-18 DIAGNOSIS — K7460 Unspecified cirrhosis of liver: Secondary | ICD-10-CM

## 2014-12-18 LAB — BASIC METABOLIC PANEL
BUN: 17 mg/dL (ref 6–23)
CHLORIDE: 101 meq/L (ref 96–112)
CO2: 27 meq/L (ref 19–32)
CREATININE: 1.31 mg/dL (ref 0.40–1.50)
Calcium: 9 mg/dL (ref 8.4–10.5)
GFR: 62 mL/min (ref >60.00)
Glucose, Bld: 98 mg/dL (ref 70–99)
Potassium: 3.6 mEq/L (ref 3.5–5.1)
Sodium: 133 mEq/L — ABNORMAL LOW (ref 135–145)

## 2014-12-27 ENCOUNTER — Encounter (HOSPITAL_COMMUNITY): Payer: Self-pay | Admitting: *Deleted

## 2014-12-27 ENCOUNTER — Emergency Department (HOSPITAL_BASED_OUTPATIENT_CLINIC_OR_DEPARTMENT_OTHER)
Admit: 2014-12-27 | Discharge: 2014-12-27 | Disposition: A | Discharge status: Home / Self Care | Payer: BLUE CROSS/BLUE SHIELD

## 2014-12-27 ENCOUNTER — Emergency Department (HOSPITAL_COMMUNITY)
Admission: EM | Admit: 2014-12-27 | Discharge: 2014-12-27 | Disposition: A | Discharge status: Home / Self Care | Payer: BLUE CROSS/BLUE SHIELD | Attending: Emergency Medicine | Admitting: Emergency Medicine

## 2014-12-27 DIAGNOSIS — Z9981 Dependence on supplemental oxygen: Secondary | ICD-10-CM | POA: Insufficient documentation

## 2014-12-27 DIAGNOSIS — L03116 Cellulitis of left lower limb: Secondary | ICD-10-CM | POA: Insufficient documentation

## 2014-12-27 DIAGNOSIS — M79609 Pain in unspecified limb: Secondary | ICD-10-CM

## 2014-12-27 DIAGNOSIS — R51 Headache: Secondary | ICD-10-CM | POA: Diagnosis not present

## 2014-12-27 DIAGNOSIS — Z79899 Other long term (current) drug therapy: Secondary | ICD-10-CM | POA: Diagnosis not present

## 2014-12-27 DIAGNOSIS — Z862 Personal history of diseases of the blood and blood-forming organs and certain disorders involving the immune mechanism: Secondary | ICD-10-CM | POA: Diagnosis not present

## 2014-12-27 DIAGNOSIS — K219 Gastro-esophageal reflux disease without esophagitis: Secondary | ICD-10-CM | POA: Insufficient documentation

## 2014-12-27 DIAGNOSIS — G4733 Obstructive sleep apnea (adult) (pediatric): Secondary | ICD-10-CM | POA: Insufficient documentation

## 2014-12-27 DIAGNOSIS — E785 Hyperlipidemia, unspecified: Secondary | ICD-10-CM | POA: Diagnosis not present

## 2014-12-27 DIAGNOSIS — I10 Essential (primary) hypertension: Secondary | ICD-10-CM | POA: Diagnosis not present

## 2014-12-27 DIAGNOSIS — I5032 Chronic diastolic (congestive) heart failure: Secondary | ICD-10-CM | POA: Diagnosis not present

## 2014-12-27 DIAGNOSIS — M79605 Pain in left leg: Secondary | ICD-10-CM | POA: Diagnosis present

## 2014-12-27 LAB — COMPREHENSIVE METABOLIC PANEL
ALK PHOS: 143 U/L — AB (ref 38–126)
ALT: 31 U/L (ref 17–63)
ANION GAP: 8 (ref 5–15)
AST: 69 U/L — ABNORMAL HIGH (ref 15–41)
Albumin: 2.2 g/dL — ABNORMAL LOW (ref 3.5–5.0)
BUN: 15 mg/dL (ref 6–20)
CHLORIDE: 99 mmol/L — AB (ref 101–111)
CO2: 22 mmol/L (ref 22–32)
Calcium: 8.2 mg/dL — ABNORMAL LOW (ref 8.9–10.3)
Creatinine, Ser: 1.38 mg/dL — ABNORMAL HIGH (ref 0.61–1.24)
GFR calc Af Amer: >60 mL/min (ref >60)
GFR calc non Af Amer: 59 mL/min — ABNORMAL LOW (ref >60)
Glucose, Bld: 99 mg/dL (ref 65–99)
POTASSIUM: 4 mmol/L (ref 3.5–5.1)
Sodium: 129 mmol/L — ABNORMAL LOW (ref 135–145)
Total Bilirubin: 4.1 mg/dL — ABNORMAL HIGH (ref 0.3–1.2)
Total Protein: 7.8 g/dL (ref 6.5–8.1)

## 2014-12-27 LAB — CBC WITH DIFFERENTIAL/PLATELET
BASOS PCT: 0 % (ref 0–1)
Basophils Absolute: 0 10*3/uL (ref 0.0–0.1)
Eosinophils Absolute: 0.1 10*3/uL (ref 0.0–0.7)
Eosinophils Relative: 1 % (ref 0–5)
HCT: 32.7 % — ABNORMAL LOW (ref 39.0–52.0)
HEMOGLOBIN: 11.8 g/dL — AB (ref 13.0–17.0)
LYMPHS PCT: 4 % — AB (ref 12–46)
Lymphs Abs: 0.4 10*3/uL — ABNORMAL LOW (ref 0.7–4.0)
MCH: 34.6 pg — ABNORMAL HIGH (ref 26.0–34.0)
MCHC: 36.1 g/dL — AB (ref 30.0–36.0)
MCV: 95.9 fL (ref 78.0–100.0)
MONOS PCT: 8 % (ref 3–12)
Monocytes Absolute: 0.8 10*3/uL (ref 0.1–1.0)
NEUTROS PCT: 87 % — AB (ref 43–77)
Neutro Abs: 9.5 10*3/uL — ABNORMAL HIGH (ref 1.7–7.7)
Platelets: 44 10*3/uL — ABNORMAL LOW (ref 150–400)
RBC: 3.41 MIL/uL — ABNORMAL LOW (ref 4.22–5.81)
RDW: 15.3 % (ref 11.5–15.5)
WBC: 10.9 10*3/uL — AB (ref 4.0–10.5)

## 2014-12-27 LAB — BRAIN NATRIURETIC PEPTIDE: B Natriuretic Peptide: 192 pg/mL — ABNORMAL HIGH (ref 0.0–100.0)

## 2014-12-27 MED ORDER — RIZATRIPTAN BENZOATE 5 MG PO TABS: 5.0000 mg | ORAL_TABLET | ORAL | Status: DC | PRN

## 2014-12-27 MED ORDER — MORPHINE SULFATE 4 MG/ML IJ SOLN
6.0000 mg | Freq: Once | INTRAMUSCULAR | Status: AC
  Administered 2014-12-27: 6 mg via INTRAVENOUS
  Filled 2014-12-27: qty 2

## 2014-12-27 MED ORDER — CEFAZOLIN SODIUM 1-5 GM-% IV SOLN
1.0000 g | Freq: Once | INTRAVENOUS | Status: AC
  Administered 2014-12-27: 1 g via INTRAVENOUS
  Filled 2014-12-27: qty 50

## 2014-12-27 MED ORDER — CEPHALEXIN 500 MG PO CAPS: 500.0000 mg | ORAL_CAPSULE | Freq: Two times a day (BID) | ORAL | Status: DC

## 2014-12-27 MED ORDER — MORPHINE SULFATE 4 MG/ML IJ SOLN: 6.0000 mg | Freq: Once | INTRAMUSCULAR | Status: DC

## 2014-12-27 MED ORDER — SULFAMETHOXAZOLE-TRIMETHOPRIM 800-160 MG PO TABS: 1.0000 | ORAL_TABLET | Freq: Two times a day (BID) | ORAL | Status: DC

## 2014-12-27 MED ORDER — ONDANSETRON HCL 4 MG/2ML IJ SOLN: 4.0000 mg | Freq: Once | INTRAMUSCULAR | Status: DC

## 2014-12-27 NOTE — ED Notes (Addendum)
Pt's wife is expressing concerns about patient's care, labs that were drawn, and whether her husband will be sent home when he is not ready to go.  PA is aware and has discussed with family as well.  Wife is tearful in room at this time, texting family members, and requesting names of all people involved in pt's care.  This RN helped to answer questions as best as possible and asked if pt or wife would like provider to come back to room to discuss concerns.  Pt and family declined at this time.    Pt and family also made aware of decision to hold pain medicine at this time due to earlier "hallucinations" reported by wife.  Wife requested we look in to a medication known as "Maxalt".  PA made aware.

## 2014-12-27 NOTE — Discharge Instructions (Signed)
You have evidence of skin infection involving your left leg.  Please take antibiotics as prescribed for the full duration.  Follow up closely with your doctor for further care.  Return to ER if your condition worsen or if you have other concerns.  Cellulitis Cellulitis is an infection of the skin and the tissue beneath it. The infected area is usually red and tender. Cellulitis occurs most often in the arms and lower legs.  CAUSES  Cellulitis is caused by bacteria that enter the skin through cracks or cuts in the skin. The most common types of bacteria that cause cellulitis are staphylococci and streptococci. SIGNS AND SYMPTOMS   Redness and warmth.  Swelling.  Tenderness or pain.  Fever. DIAGNOSIS  Your health care provider can usually determine what is wrong based on a physical exam. Blood tests may also be done. TREATMENT  Treatment usually involves taking an antibiotic medicine. HOME CARE INSTRUCTIONS   Take your antibiotic medicine as directed by your health care provider. Finish the antibiotic even if you start to feel better.  Keep the infected arm or leg elevated to reduce swelling.  Apply a warm cloth to the affected area up to 4 times per day to relieve pain.  Take medicines only as directed by your health care provider.  Keep all follow-up visits as directed by your health care provider. SEEK MEDICAL CARE IF:   You notice red streaks coming from the infected area.  Your red area gets larger or turns dark in color.  Your bone or joint underneath the infected area becomes painful after the skin has healed.  Your infection returns in the same area or another area.  You notice a swollen bump in the infected area.  You develop new symptoms.  You have a fever. SEEK IMMEDIATE MEDICAL CARE IF:   You feel very sleepy.  You develop vomiting or diarrhea.  You have a general ill feeling (malaise) with muscle aches and pains. MAKE SURE YOU:   Understand these  instructions.  Will watch your condition.  Will get help right away if you are not doing well or get worse. Document Released: 03/23/2005 Document Revised: 10/28/2013 Document Reviewed: 08/29/2011 [facility] Patient Information 2015 [place], [place]. This information is not intended to replace advice given to you by your health care provider. Make sure you discuss any questions you have with your health care provider.

## 2014-12-27 NOTE — ED Provider Notes (Signed)
CSN: [id number]     Arrival date & time 12/27/14  1316 History   First MD Initiated Contact with Patient 12/27/14 1535     Chief Complaint  Patient presents with  . Leg Pain  . Headache     (Consider location/radiation/quality/duration/timing/severity/associated sxs/prior Treatment) HPI   49 year old male with history of alcohol-induced liver cirrhosis, chronic CHF, neuropathy, and hypertension presenting for evaluation of left leg pain. Patient complaining of pain to his left leg starting from the groin and radiates to his foot, onset earlier this morning. He first noticed pain when he was bending his leg. States pain is a sharp and throbbing sensation, persistent, worsening with movement. He also noticed that leg is more swollen than usual with redness and warmth to the lower leg. He weighed himself and noticed that he is 3 pounds heavier than yesterday. Now he also endorsed having chills. Endorse having a mild throbbing frontal headache along with pain. He denies any prior history of PE or DVT, no recent surgery, recent travel, chest pain, shortness of breath, hemoptysis, abdominal pain. Takes lactulose daily and does endorse regular loose stools. No complaint of fever. Patient has successfully quit alcohol use for more than 2 months.  Past Medical History  Diagnosis Date  . Allergy   . GERD (gastroesophageal reflux disease)   . Hyperlipidemia   . Hypertension   . Neuropathy   . Thrombocytopenia 12/15/2011  . Chronic diastolic heart failure 09/02/1015  . Alcohol dependence 07/11/2012  . Alcoholic cirrhosis of liver with ascites 07/2014  . OSA on CPAP 07/11/2012    HST 07/2013:  AHI 39/hr.     Past Surgical History  Procedure Laterality Date  . Hand surgery  1997  . Left and right heart catheterization with coronary angiogram N/A 06/10/2013    Procedure: LEFT AND RIGHT HEART CATHETERIZATION WITH CORONARY ANGIOGRAM;  Surgeon: Blane Ohara, MD;  Location: [facility] CATH LAB;  Service:  Cardiovascular;  Laterality: N/A;  . Colonoscopy  08/2013    hyperplastic polyps, hemorrhoids Ardis Hughs)  . 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 Stroke Mother   . Hypertension Father   . Heart disease Father   . Lung cancer Maternal Aunt   . Emphysema Mother   . Emphysema Father   . Heart attack Neg Hx   . Stroke Paternal Grandmother    History  Substance Use Topics  . Smoking status: Never Smoker   . Smokeless tobacco: Current User    Types: Chew  . Alcohol Use: 0.0 oz/week    0 Standard drinks or equivalent per week     Comment: 4-6 drinks daily - NO ETOH SINCE APRIL 2016    Review of Systems  All other systems reviewed and are negative.     Allergies  Hydrochlorothiazide w-triamterene and Lisinopril  Home Medications   Prior to Admission medications   Medication Sig Start Date End Date Taking? Authorizing Provider  ALPRAZolam Duanne Moron) 0.5 MG tablet Take 1 tablet (0.5 mg total) by mouth 2 (two) times daily as needed for anxiety. 08/28/14   Ria Bush, MD  furosemide (LASIX) 40 MG tablet Take 1 tablet (40 mg total) by mouth 2 (two) times daily. 11/29/14   Allie Bossier, MD  ibuprofen (ADVIL,MOTRIN) 200 MG tablet Take 400 mg by mouth every 6 (six) hours as needed for moderate pain.    Historical Provider, MD  lactulose ([place]) 10 GM/15ML  solution Take 30 mLs (20 g total) by mouth 2 (two) times daily. 12/05/14   Ria Bush, MD  magnesium oxide (MAG-OX) 400 (241.3 MG) MG tablet Take 1 tablet (400 mg total) by mouth 2 (two) times daily. 11/29/14   Allie Bossier, MD  omeprazole (PRILOSEC) 20 MG capsule Take 20 mg by mouth daily. 06/30/14   Historical Provider, MD  phytonadione (VITAMIN K) 5 MG tablet Take 2 tablets (10 mg total) by mouth daily. 11/29/14   Allie Bossier, MD  potassium chloride SA (K-DUR,KLOR-CON) 20 MEQ tablet Take 2 tablets (40 mEq total) by mouth 2 (two)  times daily. 11/29/14   Allie Bossier, MD  sildenafil (VIAGRA) 100 MG tablet Take 0.5-1 tablets (50-100 mg total) by mouth daily as needed for erectile dysfunction. 10/01/14   Ria Bush, MD  spironolactone (ALDACTONE) 50 MG tablet Take 1 tablet (50 mg total) by mouth 2 (two) times daily. 09/10/14   Ria Bush, MD  Tetrahydrozoline HCl (VISINE OP) Place 1 drop into both eyes daily.    Historical Provider, MD   BP 145/70 mmHg  Pulse 92  Temp(Src) 98.6 F (37 C) (Oral)  Resp 16  Ht 5\' 7"  (1.702 m)  Wt 251 lb (113.853 kg)  BMI 39.30 kg/m2  SpO2 97% Physical Exam  Constitutional: He is oriented to person, place, and time. He appears well-developed and well-nourished. No distress.  Ill-appearing Caucasian male, appears uncomfortable  HENT:  Head: Atraumatic.  Mouth/Throat: Oropharynx is clear and moist.  Eyes: Conjunctivae are normal.  Neck: Neck supple. No JVD present.  Cardiovascular: Normal rate and regular rhythm.   Pulmonary/Chest: Effort normal and breath sounds normal. He has no wheezes. He has no rales.  Abdominal: Soft. There is no tenderness.  Musculoskeletal: He exhibits tenderness (Left leg: Large lipoma noted to the medial groin that is mildly tender. Leg is mildly tender throughout with gentle palpation. Erythema noted to the anterior aspects of tib-fib region. 2+ pitting edema. Pedal pulses intact with brisk cap refill.).  Neurological: He is alert and oriented to person, place, and time.  Skin: No rash noted.  Psychiatric: He has a normal mood and affect.  Nursing note and vitals reviewed.   ED Course  Procedures (including critical care time)  Patient with history of CHF, liver cirrhosis here with worsening left leg pain and swelling. He has erythema noted to the anterior aspects of his left leg suggestive of cellulitis. Plan to obtain venous ULTRASOUND to rule out DVT, if negative, will treat for cellulitis. He is currently afebrile with stable normal vital  signs.  6:08 PM I'll also abnormal, it is at baseline as compared to prior labs. No significant leukocytosis. Vascular ultrasound study of the left lower extremity shows no evidence of DVT or SVT. Patient will be treated for cellulitis with Ancef via IV. Plan to discharge patient with Keflex and Bactrim. Patient is scheduled to see PCP in 5 days.  7:18 PM Pt's wife asked pt's ammonia level.  I explain that one was not obtain during this visit as pt is mentating appropriate and does not appears altered.  Pt answer questions appropriately.  Wife ask to if pt will be admitted.  At this time, pt is afebrile, VSS, labs are mostly at baseline.  He can tolerates PO, he has an appoint with his PCP in 4 days.  He is currently receiving ancef via IV and will be discharge with keflex/bactrim and close follow up.  Pt does not meet inpt  admission at this time.  He has evidence of mild renal insufficiency, has hx of it in the past.  He has CHF, i did give gentle hydration.  I encourage for pt to return to ER if condition worsen or if he has any other concerns.    Labs Review Labs Reviewed  CBC WITH DIFFERENTIAL/PLATELET - Abnormal; Notable for the following:    WBC 10.9 (*)    RBC 3.41 (*)    Hemoglobin 11.8 (*)    HCT 32.7 (*)    MCH 34.6 (*)    MCHC 36.1 (*)    Platelets 44 (*)    Neutrophils Relative % 87 (*)    Neutro Abs 9.5 (*)    Lymphocytes Relative 4 (*)    Lymphs Abs 0.4 (*)    All other components within normal limits  COMPREHENSIVE METABOLIC PANEL - Abnormal; Notable for the following:    Sodium 129 (*)    Chloride 99 (*)    Creatinine, Ser 1.38 (*)    Calcium 8.2 (*)    Albumin 2.2 (*)    AST 69 (*)    Alkaline Phosphatase 143 (*)    Total Bilirubin 4.1 (*)    GFR calc non Af Amer 59 (*)    All other components within normal limits  BRAIN NATRIURETIC PEPTIDE - Abnormal; Notable for the following:    B Natriuretic Peptide 192.0 (*)    All other components within normal limits     Imaging Review No results found.   Colton Porter, Colton Porter Male 1966-01-03 [id number]    Progress Notes by Iantha Fallen, RVS at 12/27/2014 4:42 PM    Author: Iantha Fallen, RVS Service: Vascular Lab Author Type: Cardiovascular Sonographer   Filed: 12/27/2014 4:42 PM Note Time: 12/27/2014 4:42 PM Status: Signed   Editor: Iantha Fallen, RVS (Cardiovascular Sonographer)     Expand All Collapse All   VASCULAR LAB PRELIMINARY PRELIMINARY PRELIMINARY PRELIMINARY  Left lower extremity venous Doppler completed.   Preliminary report: There is no obvious evidence of DVT or SVT in the left lower extremity.   KANADY, CANDACE, RVT 12/27/2014, 4:42 PM        EKG Interpretation None      MDM   Final diagnoses:  Cellulitis of left leg without foot    BP 123/56 mmHg  Pulse 99  Temp(Src) 98.6 F (37 C) (Oral)  Resp 16  Ht 5\' 7"  (1.702 m)  Wt 251 lb (113.853 kg)  BMI 39.30 kg/m2  SpO2 96%     Domenic Moras, PA-C 12/27/14 1921  Tanna Furry, MD 12/31/14 [phone]

## 2014-12-27 NOTE — ED Notes (Signed)
Danielle RN sts patient's wife found in the hallway earlier crying.  Pt's wife sts patient hallucinating his dead parents.  Will speak to provider about medication order.

## 2014-12-27 NOTE — Progress Notes (Signed)
VASCULAR LAB PRELIMINARY  PRELIMINARY  PRELIMINARY  PRELIMINARY  Left lower extremity venous Doppler completed.    Preliminary report:  There is no obvious evidence of DVT or SVT in the left lower extremity.    Nabila Albarracin, RVT 12/27/2014, 4:42 PM

## 2014-12-27 NOTE — ED Notes (Addendum)
Pt states L leg pain and headache.  Pain to medial thigh where he's had a lipoma for several years and pain to L lower leg.  2+ bil LE edema with redness to LLE.  Pt also c/o frontal headache he describes as sinus pressure.  Pt states he weighed himself and he's 3 lbs heavier than he was yesterday.

## 2014-12-28 ENCOUNTER — Other Ambulatory Visit: Payer: Self-pay | Admitting: Family Medicine

## 2014-12-30 ENCOUNTER — Encounter (HOSPITAL_COMMUNITY): Payer: Self-pay | Admitting: Emergency Medicine

## 2014-12-30 DIAGNOSIS — E785 Hyperlipidemia, unspecified: Secondary | ICD-10-CM | POA: Diagnosis present

## 2014-12-30 DIAGNOSIS — L03116 Cellulitis of left lower limb: Secondary | ICD-10-CM | POA: Diagnosis not present

## 2014-12-30 DIAGNOSIS — K7031 Alcoholic cirrhosis of liver with ascites: Secondary | ICD-10-CM | POA: Diagnosis present

## 2014-12-30 DIAGNOSIS — N182 Chronic kidney disease, stage 2 (mild): Secondary | ICD-10-CM | POA: Diagnosis present

## 2014-12-30 DIAGNOSIS — N179 Acute kidney failure, unspecified: Secondary | ICD-10-CM | POA: Diagnosis present

## 2014-12-30 DIAGNOSIS — Z8249 Family history of ischemic heart disease and other diseases of the circulatory system: Secondary | ICD-10-CM

## 2014-12-30 DIAGNOSIS — G629 Polyneuropathy, unspecified: Secondary | ICD-10-CM | POA: Diagnosis present

## 2014-12-30 DIAGNOSIS — Z6838 Body mass index (BMI) 38.0-38.9, adult: Secondary | ICD-10-CM

## 2014-12-30 DIAGNOSIS — I5032 Chronic diastolic (congestive) heart failure: Secondary | ICD-10-CM | POA: Diagnosis present

## 2014-12-30 DIAGNOSIS — F1021 Alcohol dependence, in remission: Secondary | ICD-10-CM | POA: Diagnosis present

## 2014-12-30 DIAGNOSIS — G4733 Obstructive sleep apnea (adult) (pediatric): Secondary | ICD-10-CM | POA: Diagnosis present

## 2014-12-30 DIAGNOSIS — Z888 Allergy status to other drugs, medicaments and biological substances status: Secondary | ICD-10-CM

## 2014-12-30 DIAGNOSIS — Z79899 Other long term (current) drug therapy: Secondary | ICD-10-CM

## 2014-12-30 DIAGNOSIS — D72819 Decreased white blood cell count, unspecified: Secondary | ICD-10-CM | POA: Diagnosis present

## 2014-12-30 DIAGNOSIS — K219 Gastro-esophageal reflux disease without esophagitis: Secondary | ICD-10-CM | POA: Diagnosis present

## 2014-12-30 DIAGNOSIS — A419 Sepsis, unspecified organism: Secondary | ICD-10-CM | POA: Diagnosis not present

## 2014-12-30 DIAGNOSIS — D696 Thrombocytopenia, unspecified: Secondary | ICD-10-CM | POA: Diagnosis present

## 2014-12-30 DIAGNOSIS — D649 Anemia, unspecified: Secondary | ICD-10-CM | POA: Diagnosis present

## 2014-12-30 DIAGNOSIS — F17220 Nicotine dependence, chewing tobacco, uncomplicated: Secondary | ICD-10-CM | POA: Diagnosis present

## 2014-12-30 DIAGNOSIS — Z886 Allergy status to analgesic agent status: Secondary | ICD-10-CM

## 2014-12-30 DIAGNOSIS — E669 Obesity, unspecified: Secondary | ICD-10-CM | POA: Diagnosis present

## 2014-12-30 DIAGNOSIS — J309 Allergic rhinitis, unspecified: Secondary | ICD-10-CM | POA: Diagnosis present

## 2014-12-30 DIAGNOSIS — I129 Hypertensive chronic kidney disease with stage 1 through stage 4 chronic kidney disease, or unspecified chronic kidney disease: Secondary | ICD-10-CM | POA: Diagnosis present

## 2014-12-30 DIAGNOSIS — Z86010 Personal history of colonic polyps: Secondary | ICD-10-CM

## 2014-12-30 LAB — CBC WITH DIFFERENTIAL/PLATELET
BASOS ABS: 0 10*3/uL (ref 0.0–0.1)
Basophils Relative: 1 % (ref 0–1)
Eosinophils Absolute: 0.3 10*3/uL (ref 0.0–0.7)
Eosinophils Relative: 5 % (ref 0–5)
HCT: 28.7 % — ABNORMAL LOW (ref 39.0–52.0)
HEMOGLOBIN: 10.4 g/dL — AB (ref 13.0–17.0)
Lymphocytes Relative: 22 % (ref 12–46)
Lymphs Abs: 1.1 10*3/uL (ref 0.7–4.0)
MCH: 34.3 pg — ABNORMAL HIGH (ref 26.0–34.0)
MCHC: 36.2 g/dL — ABNORMAL HIGH (ref 30.0–36.0)
MCV: 94.7 fL (ref 78.0–100.0)
Monocytes Absolute: 0.6 10*3/uL (ref 0.1–1.0)
Monocytes Relative: 11 % (ref 3–12)
NEUTROS ABS: 3.2 10*3/uL (ref 1.7–7.7)
Neutrophils Relative %: 62 % (ref 43–77)
PLATELETS: 52 10*3/uL — AB (ref 150–400)
RBC: 3.03 MIL/uL — ABNORMAL LOW (ref 4.22–5.81)
RDW: 15.5 % (ref 11.5–15.5)
WBC: 5.2 10*3/uL (ref 4.0–10.5)

## 2014-12-30 NOTE — ED Notes (Signed)
Pt. reports worsening left lower leg / left foot pain and swelling( cellulitis )  this week , seen here 12/27/2014 prescribed with Keflex and Septra DS with no improvement . Denies fever or chills.

## 2014-12-31 ENCOUNTER — Inpatient Hospital Stay (HOSPITAL_COMMUNITY)
Admission: EM | Admit: 2014-12-31 | Discharge: 2015-01-03 | DRG: 602 | Disposition: A | Discharge status: Home / Self Care | Payer: BLUE CROSS/BLUE SHIELD | Attending: Internal Medicine | Admitting: Internal Medicine

## 2014-12-31 ENCOUNTER — Ambulatory Visit: Payer: BLUE CROSS/BLUE SHIELD | Admitting: Family Medicine

## 2014-12-31 ENCOUNTER — Encounter (HOSPITAL_COMMUNITY): Payer: Self-pay

## 2014-12-31 DIAGNOSIS — D72819 Decreased white blood cell count, unspecified: Secondary | ICD-10-CM | POA: Diagnosis present

## 2014-12-31 DIAGNOSIS — N179 Acute kidney failure, unspecified: Secondary | ICD-10-CM | POA: Diagnosis present

## 2014-12-31 DIAGNOSIS — A419 Sepsis, unspecified organism: Secondary | ICD-10-CM

## 2014-12-31 DIAGNOSIS — Z8719 Personal history of other diseases of the digestive system: Secondary | ICD-10-CM | POA: Diagnosis present

## 2014-12-31 DIAGNOSIS — Z86010 Personal history of colonic polyps: Secondary | ICD-10-CM | POA: Diagnosis not present

## 2014-12-31 DIAGNOSIS — Z888 Allergy status to other drugs, medicaments and biological substances status: Secondary | ICD-10-CM | POA: Diagnosis not present

## 2014-12-31 DIAGNOSIS — F1021 Alcohol dependence, in remission: Secondary | ICD-10-CM | POA: Diagnosis present

## 2014-12-31 DIAGNOSIS — Z6838 Body mass index (BMI) 38.0-38.9, adult: Secondary | ICD-10-CM | POA: Diagnosis not present

## 2014-12-31 DIAGNOSIS — D696 Thrombocytopenia, unspecified: Secondary | ICD-10-CM | POA: Diagnosis present

## 2014-12-31 DIAGNOSIS — I5030 Unspecified diastolic (congestive) heart failure: Secondary | ICD-10-CM | POA: Diagnosis present

## 2014-12-31 DIAGNOSIS — D649 Anemia, unspecified: Secondary | ICD-10-CM | POA: Diagnosis present

## 2014-12-31 DIAGNOSIS — K7030 Alcoholic cirrhosis of liver without ascites: Secondary | ICD-10-CM | POA: Diagnosis present

## 2014-12-31 DIAGNOSIS — L03116 Cellulitis of left lower limb: Secondary | ICD-10-CM | POA: Diagnosis present

## 2014-12-31 DIAGNOSIS — E669 Obesity, unspecified: Secondary | ICD-10-CM | POA: Diagnosis present

## 2014-12-31 DIAGNOSIS — K7031 Alcoholic cirrhosis of liver with ascites: Secondary | ICD-10-CM

## 2014-12-31 DIAGNOSIS — N182 Chronic kidney disease, stage 2 (mild): Secondary | ICD-10-CM | POA: Diagnosis present

## 2014-12-31 DIAGNOSIS — Z886 Allergy status to analgesic agent status: Secondary | ICD-10-CM | POA: Diagnosis not present

## 2014-12-31 DIAGNOSIS — I10 Essential (primary) hypertension: Secondary | ICD-10-CM

## 2014-12-31 DIAGNOSIS — Z6841 Body Mass Index (BMI) 40.0 and over, adult: Secondary | ICD-10-CM

## 2014-12-31 DIAGNOSIS — I5032 Chronic diastolic (congestive) heart failure: Secondary | ICD-10-CM

## 2014-12-31 DIAGNOSIS — K219 Gastro-esophageal reflux disease without esophagitis: Secondary | ICD-10-CM | POA: Diagnosis present

## 2014-12-31 DIAGNOSIS — I129 Hypertensive chronic kidney disease with stage 1 through stage 4 chronic kidney disease, or unspecified chronic kidney disease: Secondary | ICD-10-CM | POA: Diagnosis present

## 2014-12-31 DIAGNOSIS — J309 Allergic rhinitis, unspecified: Secondary | ICD-10-CM | POA: Diagnosis present

## 2014-12-31 DIAGNOSIS — G629 Polyneuropathy, unspecified: Secondary | ICD-10-CM | POA: Diagnosis present

## 2014-12-31 DIAGNOSIS — E785 Hyperlipidemia, unspecified: Secondary | ICD-10-CM | POA: Diagnosis present

## 2014-12-31 DIAGNOSIS — F17220 Nicotine dependence, chewing tobacco, uncomplicated: Secondary | ICD-10-CM | POA: Diagnosis present

## 2014-12-31 DIAGNOSIS — G4733 Obstructive sleep apnea (adult) (pediatric): Secondary | ICD-10-CM | POA: Diagnosis present

## 2014-12-31 DIAGNOSIS — Z8249 Family history of ischemic heart disease and other diseases of the circulatory system: Secondary | ICD-10-CM | POA: Diagnosis not present

## 2014-12-31 DIAGNOSIS — Z79899 Other long term (current) drug therapy: Secondary | ICD-10-CM | POA: Diagnosis not present

## 2014-12-31 LAB — COMPREHENSIVE METABOLIC PANEL
ALK PHOS: 96 U/L (ref 38–126)
ALT: 23 U/L (ref 17–63)
AST: 51 U/L — ABNORMAL HIGH (ref 15–41)
Albumin: 2 g/dL — ABNORMAL LOW (ref 3.5–5.0)
Anion gap: 9 (ref 5–15)
BUN: 21 mg/dL — AB (ref 6–20)
CHLORIDE: 100 mmol/L — AB (ref 101–111)
CO2: 24 mmol/L (ref 22–32)
Calcium: 8.2 mg/dL — ABNORMAL LOW (ref 8.9–10.3)
Creatinine, Ser: 2.06 mg/dL — ABNORMAL HIGH (ref 0.61–1.24)
GFR calc Af Amer: 42 mL/min — ABNORMAL LOW (ref >60)
GFR calc non Af Amer: 36 mL/min — ABNORMAL LOW (ref >60)
GLUCOSE: 103 mg/dL — AB (ref 65–99)
Potassium: 3.3 mmol/L — ABNORMAL LOW (ref 3.5–5.1)
Sodium: 133 mmol/L — ABNORMAL LOW (ref 135–145)
Total Bilirubin: 2.7 mg/dL — ABNORMAL HIGH (ref 0.3–1.2)
Total Protein: 7.3 g/dL (ref 6.5–8.1)

## 2014-12-31 LAB — CBC
HCT: 26.3 % — ABNORMAL LOW (ref 39.0–52.0)
Hemoglobin: 9.4 g/dL — ABNORMAL LOW (ref 13.0–17.0)
MCH: 34.1 pg — AB (ref 26.0–34.0)
MCHC: 35.7 g/dL (ref 30.0–36.0)
MCV: 95.3 fL (ref 78.0–100.0)
Platelets: 49 10*3/uL — ABNORMAL LOW (ref 150–400)
RBC: 2.76 MIL/uL — ABNORMAL LOW (ref 4.22–5.81)
RDW: 15.6 % — ABNORMAL HIGH (ref 11.5–15.5)
WBC: 3.6 10*3/uL — ABNORMAL LOW (ref 4.0–10.5)

## 2014-12-31 LAB — BASIC METABOLIC PANEL
ANION GAP: 8 (ref 5–15)
BUN: 17 mg/dL (ref 6–20)
CHLORIDE: 103 mmol/L (ref 101–111)
CO2: 23 mmol/L (ref 22–32)
CREATININE: 1.81 mg/dL — AB (ref 0.61–1.24)
Calcium: 7.9 mg/dL — ABNORMAL LOW (ref 8.9–10.3)
GFR calc non Af Amer: 43 mL/min — ABNORMAL LOW (ref >60)
GFR, EST AFRICAN AMERICAN: 49 mL/min — AB (ref >60)
Glucose, Bld: 87 mg/dL (ref 65–99)
POTASSIUM: 3.1 mmol/L — AB (ref 3.5–5.1)
SODIUM: 134 mmol/L — AB (ref 135–145)

## 2014-12-31 MED ORDER — OXYCODONE HCL 5 MG PO TABS
5.0000 mg | ORAL_TABLET | ORAL | Status: DC | PRN
  Administered 2014-12-31 – 2015-01-01 (×3): 5 mg via ORAL
  Filled 2014-12-31 (×3): qty 1

## 2014-12-31 MED ORDER — VANCOMYCIN HCL IN DEXTROSE 1-5 GM/200ML-% IV SOLN: 1000.0000 mg | Freq: Once | INTRAVENOUS | Status: DC

## 2014-12-31 MED ORDER — SODIUM CHLORIDE 0.9 % IV SOLN: 250.0000 mL | INTRAVENOUS | Status: DC | PRN

## 2014-12-31 MED ORDER — VANCOMYCIN HCL 10 G IV SOLR
1250.0000 mg | INTRAVENOUS | Status: DC
  Administered 2015-01-01 – 2015-01-02 (×2): 1250 mg via INTRAVENOUS
  Filled 2014-12-31 (×2): qty 1250

## 2014-12-31 MED ORDER — ONDANSETRON HCL 4 MG/2ML IJ SOLN: 4.0000 mg | Freq: Four times a day (QID) | INTRAMUSCULAR | Status: DC | PRN

## 2014-12-31 MED ORDER — SODIUM CHLORIDE 0.9 % IJ SOLN: 3.0000 mL | INTRAMUSCULAR | Status: DC | PRN

## 2014-12-31 MED ORDER — ONDANSETRON HCL 4 MG PO TABS: 4.0000 mg | ORAL_TABLET | Freq: Four times a day (QID) | ORAL | Status: DC | PRN

## 2014-12-31 MED ORDER — LACTULOSE 10 GM/15ML PO SOLN: 20.0000 g | Freq: Two times a day (BID) | ORAL | Status: DC

## 2014-12-31 MED ORDER — SPIRONOLACTONE 50 MG PO TABS
50.0000 mg | ORAL_TABLET | Freq: Every day | ORAL | Status: DC
  Administered 2014-12-31: 50 mg via ORAL
  Filled 2014-12-31: qty 1

## 2014-12-31 MED ORDER — SODIUM CHLORIDE 0.9 % IJ SOLN
3.0000 mL | Freq: Two times a day (BID) | INTRAMUSCULAR | Status: DC
  Administered 2014-12-31 – 2015-01-02 (×5): 3 mL via INTRAVENOUS

## 2014-12-31 MED ORDER — FUROSEMIDE 40 MG PO TABS
40.0000 mg | ORAL_TABLET | Freq: Two times a day (BID) | ORAL | Status: DC
  Administered 2014-12-31: 40 mg via ORAL
  Filled 2014-12-31: qty 1

## 2014-12-31 MED ORDER — LACTULOSE 10 GM/15ML PO SOLN
30.0000 g | Freq: Two times a day (BID) | ORAL | Status: DC
  Administered 2014-12-31 – 2015-01-03 (×6): 30 g via ORAL
  Filled 2014-12-31 (×7): qty 45

## 2014-12-31 MED ORDER — VANCOMYCIN HCL 10 G IV SOLR
2500.0000 mg | Freq: Once | INTRAVENOUS | Status: AC
  Administered 2014-12-31: 2500 mg via INTRAVENOUS
  Filled 2014-12-31: qty 2500

## 2014-12-31 NOTE — ED Notes (Signed)
2 x RN attempts to start IV with no success; IV team consult placed

## 2014-12-31 NOTE — Progress Notes (Signed)
[facility] PROGRESS NOTE   Assessment/Plan: Left leg Cellulitis/SEPSIS: - started empirically on vanc 12/30/2014. Has defervesced, his leukocytosis now into the leukopenic range  AKI on chronic kidney disease stage II: - most likely prerenal, Lasix was held and Aldactone and he was started IV hydration. Baseline Creatinine 1.8-1.4. Will continue IV hydration.  Alcoholic cirrhosis of liver with ascites - No signs of withdrawal, resume spironolactone once euvolemic.  Leukopenia/thrombocytopenia/anemia: - due to ETOH abuse.  Chronic diastolic heart failure - Seems to be euvolemic.    Essential hypertension Pressure improved.    Obesity, Class II, BMI 35-39.9, with comorbidity     Code Status: full Family Communication: none  Disposition Plan: home in 24 hrs   Consultants:  none  Procedures:  none  Antibiotics:  vanc  HPI/Subjective: Relates his leg feel better  Objective: Filed Vitals:   12/31/14 0130 12/31/14 0200 12/31/14 0241 12/31/14 0452  BP: 123/59 127/58 142/56 103/35  Pulse: 78 77 82 76  Temp:   98.2 F (36.8 C) 98.4 F (36.9 C)  TempSrc:    Oral  Resp: 15 22 18 18   Height:      Weight:      SpO2: 99% 100% 100% 100%   No intake or output data in the 24 hours ending 12/31/14 1051 Filed Weights   12/30/14 2313  Weight: 113.399 kg (250 lb)    Exam:  General: Alert, awake, oriented x3, in no acute distress.  HEENT: No bruits, no goiter.  Heart: Regular rate and rhythm. Lungs: Good air movement, clear Abdomen: Soft, nontender, nondistended, positive bowel sounds.  Neuro: Grossly intact, nonfocal.   Data Reviewed: Basic Metabolic Panel:  Recent Labs Lab 12/27/14 1600 12/30/14 2318 12/31/14 0644  NA 129* 133* 134*  K 4.0 3.3* 3.1*  CL 99* 100* 103  CO2 22 24 23   GLUCOSE 99 103* 87  BUN 15 21* 17  CREATININE 1.38* 2.06* 1.81*  CALCIUM 8.2* 8.2* 7.9*   Liver Function Tests:  Recent Labs Lab 12/27/14 1600  12/30/14 2318  AST 69* 51*  ALT 31 23  ALKPHOS 143* 96  BILITOT 4.1* 2.7*  PROT 7.8 7.3  ALBUMIN 2.2* 2.0*   No results for input(s): LIPASE, AMYLASE in the last 168 hours. No results for input(s): AMMONIA in the last 168 hours. CBC:  Recent Labs Lab 12/27/14 1600 12/30/14 2318 12/31/14 0644  WBC 10.9* 5.2 3.6*  NEUTROABS 9.5* 3.2  --   HGB 11.8* 10.4* 9.4*  HCT 32.7* 28.7* 26.3*  MCV 95.9 94.7 95.3  PLT 44* 52* 49*   Cardiac Enzymes: No results for input(s): CKTOTAL, CKMB, CKMBINDEX, TROPONINI in the last 168 hours. BNP (last 3 results)  Recent Labs  12/27/14 1600  BNP 192.0*    ProBNP (last 3 results) No results for input(s): PROBNP in the last 8760 hours.  CBG: No results for input(s): GLUCAP in the last 168 hours.  No results found for this or any previous visit (from the past 240 hour(s)).   Studies: No results found.  Scheduled Meds: . furosemide  40 mg Oral BID  . lactulose  30 g Oral BID  . sodium chloride  3 mL Intravenous Q12H  . spironolactone  50 mg Oral Daily  . [START ON 01/01/2015] vancomycin  1,250 mg Intravenous Q24H   Continuous Infusions:   Time Spent: 25 min   Charlynne Cousins  [facility] Pager [phone]. If 7PM-7AM, please contact night-coverage at [email or web address], password [facility] 12/31/2014, 10:51 AM  LOS: 0 days

## 2014-12-31 NOTE — ED Notes (Signed)
MD at bedside. 

## 2014-12-31 NOTE — ED Provider Notes (Signed)
CSN: [id number]     Arrival date & time 12/30/14  2302 History  This chart was scribed for Merryl Hacker, MD by Randa Evens, ED Scribe. This patient was seen in room A01C/A01C and the patient's care was started at 12:16 AM.      Chief Complaint  Patient presents with  . Cellulitis   The history is provided by the patient. No language interpreter was used.   HPI Comments: Colton Porter is a 49 y.o. male who presents to the Emergency Department complaining of worsening cellulitis and leg swelling onset 4 days ago. Pt does report that the leg pain is improving. Pt also report chills and nausea. Pt was evaluated 4 days ago and was diagnosed with a cellulitis of the left leg. Pt states that he received IV antibiotics in the ED and prescribed Keflex and septra with no relief. Pt does report vomiting as soon as he got home 4 days ago.  Pt denies fever, or any other vomiting.   Of note, patient was seen and evaluated on Saturday. At that time had DVT study that was negative.   Past Medical History  Diagnosis Date  . Allergy   . GERD (gastroesophageal reflux disease)   . Hyperlipidemia   . Hypertension   . Neuropathy   . Thrombocytopenia 12/15/2011  . Chronic diastolic heart failure 4/0/9811  . Alcohol dependence 07/11/2012  . Alcoholic cirrhosis of liver with ascites 07/2014  . OSA on CPAP 07/11/2012    HST 07/2013:  AHI 39/hr.     Past Surgical History  Procedure Laterality Date  . Hand surgery  1997  . Left and right heart catheterization with coronary angiogram N/A 06/10/2013    Procedure: LEFT AND RIGHT HEART CATHETERIZATION WITH CORONARY ANGIOGRAM;  Surgeon: Blane Ohara, MD;  Location: [facility] CATH LAB;  Service: Cardiovascular;  Laterality: N/A;  . Colonoscopy  08/2013    hyperplastic polyps, hemorrhoids Ardis Hughs)  . 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 Stroke Mother   .  Hypertension Father   . Heart disease Father   . Lung cancer Maternal Aunt   . Emphysema Mother   . Emphysema Father   . Heart attack Neg Hx   . Stroke Paternal Grandmother    History  Substance Use Topics  . Smoking status: Never Smoker   . Smokeless tobacco: Current User    Types: Chew  . Alcohol Use: 0.0 oz/week    0 Standard drinks or equivalent per week     Comment: 4-6 drinks daily - NO ETOH SINCE APRIL 2016    Review of Systems  Constitutional: Positive for chills. Negative for fever.  Respiratory: Negative.  Negative for chest tightness and shortness of breath.   Cardiovascular: Negative.  Negative for chest pain.  Gastrointestinal: Positive for vomiting. Negative for abdominal pain.  Genitourinary: Negative.  Negative for dysuria.  Musculoskeletal: Negative for back pain.  Skin: Positive for color change. Negative for wound.  Neurological: Negative for headaches.  All other systems reviewed and are negative.     Allergies  Nsaids; Tylenol; Hydrochlorothiazide w-triamterene; and Lisinopril  Home Medications   Prior to Admission medications   Medication Sig Start Date End Date Taking? Authorizing Provider  ALPRAZolam Duanne Moron) 0.5 MG tablet Take 1 tablet (0.5 mg total) by mouth 2 (two) times daily as needed for anxiety. 08/28/14   Ria Bush, MD  cephALEXin [facility]) 500  MG capsule Take 1 capsule (500 mg total) by mouth 2 (two) times daily. 12/27/14   Domenic Moras, PA-C  furosemide (LASIX) 40 MG tablet Take 1 tablet (40 mg total) by mouth 2 (two) times daily. 11/29/14   Allie Bossier, MD  lactulose (CHRONULAC) 10 GM/15ML solution Take 30 g by mouth 2 (two) times daily.  12/05/14   Ria Bush, MD  magnesium oxide (MAG-OX) 400 (241.3 MG) MG tablet TAKE 1 TABLET (400 MG TOTAL) BY MOUTH 2 (TWO) TIMES DAILY. 12/30/14   Ria Bush, MD  MEPHYTON 5 MG tablet TAKE 2 TABLETS BY MOUTH EVERY DAY 12/30/14   Ria Bush, MD  omeprazole (PRILOSEC) 20 MG capsule Take 20 mg  by mouth daily. 06/30/14   Historical Provider, MD  potassium chloride SA (K-DUR,KLOR-CON) 20 MEQ tablet Take 2 tablets (40 mEq total) by mouth 2 (two) times daily. 11/29/14   Allie Bossier, MD  rizatriptan (MAXALT) 5 MG tablet Take 1 tablet (5 mg total) by mouth as needed for migraine. May repeat in 2 hours if needed 12/27/14   Domenic Moras, PA-C  spironolactone (ALDACTONE) 50 MG tablet Take 1 tablet (50 mg total) by mouth 2 (two) times daily. Patient taking differently: Take 50 mg by mouth daily.  09/10/14   Ria Bush, MD  sulfamethoxazole-trimethoprim (BACTRIM DS,SEPTRA DS) 800-160 MG per tablet Take 1 tablet by mouth 2 (two) times daily. 12/27/14 01/03/15  Domenic Moras, PA-C  Tetrahydrozoline HCl (VISINE OP) Place 1 drop into both eyes as needed (for dry eyes).     Historical Provider, MD   BP 137/63 mmHg  Pulse 86  Temp(Src) 98.4 F (36.9 C) (Oral)  Resp 18  Ht 5\' 7"  (1.702 m)  Wt 250 lb (113.399 kg)  BMI 39.15 kg/m2  SpO2 100%   Physical Exam  Constitutional: He is oriented to person, place, and time. No distress.  HENT:  Head: Normocephalic and atraumatic.  Cardiovascular: Normal rate, regular rhythm and normal heart sounds.   No murmur heard. Pulmonary/Chest: Effort normal and breath sounds normal. No respiratory distress. He has no wheezes.  Abdominal: Soft. Bowel sounds are normal. He exhibits distension. There is no tenderness. There is no rebound and no guarding.  Mildly distended but soft  Musculoskeletal:  3+ left lower extremity edema with erythema noted over anterior aspect of the lower leg, blanching erythema, blistering noted inferiorly, mild weeping, 1+ right lower extremity edema without overlying skin changes, pulses difficult to assess secondary to swelling  Neurological: He is alert and oriented to person, place, and time.  Skin: Skin is warm and dry.  Psychiatric: He has a normal mood and affect.  Nursing note and vitals reviewed.   ED Course  Procedures  (including critical care time) DIAGNOSTIC STUDIES: Oxygen Saturation is 96% on RA, adequate by my interpretation.    COORDINATION OF CARE: 12:29 AM-Discussed treatment plan with pt at bedside and pt agreed to plan.     Labs Review Labs Reviewed  CBC WITH DIFFERENTIAL/PLATELET - Abnormal; Notable for the following:    RBC 3.03 (*)    Hemoglobin 10.4 (*)    HCT 28.7 (*)    MCH 34.3 (*)    MCHC 36.2 (*)    Platelets 52 (*)    All other components within normal limits  COMPREHENSIVE METABOLIC PANEL - Abnormal; Notable for the following:    Sodium 133 (*)    Potassium 3.3 (*)    Chloride 100 (*)    Glucose, Bld 103 (*)  BUN 21 (*)    Creatinine, Ser 2.06 (*)    Calcium 8.2 (*)    Albumin 2.0 (*)    AST 51 (*)    Total Bilirubin 2.7 (*)    GFR calc non Af Amer 36 (*)    GFR calc Af Amer 42 (*)    All other components within normal limits  CULTURE, BLOOD (ROUTINE X 2)  CULTURE, BLOOD (ROUTINE X 2)    Imaging Review No results found.   EKG Interpretation None      MDM   Final diagnoses:  Cellulitis of left leg    Patient presents with worsening cellulitis of the left lower extremity. Nontoxic on exam. Afebrile.  Reports that his pain in his legs improved but the swelling and redness has worsened significantly. Reports that he is taking his antibiotics as directed. Lab work obtained from triage reviewed. Leukocytosis improved from Saturday and without a left shift. Patient does have evidence of acute on chronic kidney injury. Creatinine today is 2.0. Baseline is 1.4. Patient reports that he may not have been drinking as much as usual as he's had issues with volume overload in the past.  DVT study on Saturday was negative. Given worsening cellulitis, will obtain blood cultures and give IV vancomycin. Will admit to the hospitalist for further management. Patient is a Financial [employer] primary care patient.  I personally performed the services described in this documentation, which  was scribed in my presence. The recorded information has been reviewed and is accurate.      Merryl Hacker, MD 12/31/14 [phone]

## 2014-12-31 NOTE — Progress Notes (Signed)
ANTIBIOTIC CONSULT NOTE - INITIAL  Pharmacy Consult for vancomycin Indication: cellulitis  Allergies  Allergen Reactions  . Nsaids Other (See Comments)    cirrhosis  . Tylenol [Acetaminophen] Other (See Comments)    cirrhosis  . Hydrochlorothiazide W-Triamterene Other (See Comments)    REACTION: dizzy, nausea  . Lisinopril Other (See Comments)    REACTION: cough, decreased libido    Patient Measurements: Height: 5\' 7"  (170.2 cm) Weight: 250 lb (113.399 kg) IBW/kg (Calculated) : 66.1  Vital Signs: Temp: 98.2 F (36.8 C) (07/06 0241) Temp Source: Oral (07/05 2313) BP: 142/56 mmHg (07/06 0241) Pulse Rate: 82 (07/06 0241)  Labs:  Recent Labs  12/30/14 2318  WBC 5.2  HGB 10.4*  PLT 52*  CREATININE 2.06*   Estimated Creatinine Clearance: 52.7 mL/min (by C-G formula based on Cr of 2.06).  Medical History: Past Medical History  Diagnosis Date  . Allergy   . GERD (gastroesophageal reflux disease)   . Hyperlipidemia   . Hypertension   . Neuropathy   . Thrombocytopenia 12/15/2011  . Chronic diastolic heart failure 08/01/9561  . Alcohol dependence 07/11/2012  . Alcoholic cirrhosis of liver with ascites 07/2014  . OSA on CPAP 07/11/2012    HST 07/2013:  AHI 39/hr.      Medications:  Prescriptions prior to admission  Medication Sig Dispense Refill Last Dose  . cephALEXin (KEFLEX) 500 MG capsule Take 1 capsule (500 mg total) by mouth 2 (two) times daily. 40 capsule 0 12/30/2014 at 2045  . furosemide (LASIX) 40 MG tablet Take 1 tablet (40 mg total) by mouth 2 (two) times daily. 90 tablet 3 12/30/2014 at Unknown time  . lactulose (CHRONULAC) 10 GM/15ML solution Take 20 g by mouth 2 (two) times daily.    12/30/2014 at Unknown time  . magnesium oxide (MAG-OX) 400 (241.3 MG) MG tablet TAKE 1 TABLET (400 MG TOTAL) BY MOUTH 2 (TWO) TIMES DAILY. 60 tablet 3 12/30/2014 at Unknown time  . MEPHYTON 5 MG tablet TAKE 2 TABLETS BY MOUTH EVERY DAY 60 tablet 3 12/30/2014 at Unknown time  .  omeprazole (PRILOSEC) 20 MG capsule Take 20 mg by mouth daily.  3 12/30/2014 at Unknown time  . rizatriptan (MAXALT) 5 MG tablet Take 1 tablet (5 mg total) by mouth as needed for migraine. May repeat in 2 hours if needed 10 tablet 0 12/28/2014  . spironolactone (ALDACTONE) 50 MG tablet Take 1 tablet (50 mg total) by mouth 2 (two) times daily. (Patient taking differently: Take 50 mg by mouth daily. ) 60 tablet 3 12/30/2014 at Unknown time  . sulfamethoxazole-trimethoprim (BACTRIM DS,SEPTRA DS) 800-160 MG per tablet Take 1 tablet by mouth 2 (two) times daily. 14 tablet 0 12/30/2014 at 2045  . Tetrahydrozoline HCl (VISINE OP) Place 1 drop into both eyes as needed (for dry eyes).    over 30 days  . ALPRAZolam (XANAX) 0.5 MG tablet Take 1 tablet (0.5 mg total) by mouth 2 (two) times daily as needed for anxiety. (Patient not taking: Reported on 12/31/2014) 30 tablet 0 Not Taking at Unknown time  . potassium chloride SA (K-DUR,KLOR-CON) 20 MEQ tablet Take 2 tablets (40 mEq total) by mouth 2 (two) times daily. (Patient not taking: Reported on 12/31/2014) 60 tablet 0 Not Taking at Unknown time   Scheduled:  . furosemide  40 mg Oral BID  . lactulose  20 g Oral BID  . sodium chloride  3 mL Intravenous Q12H  . spironolactone  50 mg Oral Daily  . vancomycin  1,000 mg Intravenous Once    Assessment: 49yo male c/o LLE pain and swelling, had been Rx'd Keflex + Septra on 7/2 without improvement, to begin IV ABX for cellulitis.  Goal of Therapy:  Vancomycin trough level 10-15 mcg/ml  Plan:  ABX ordered but not given in ED; will give vancomycin 2500mg  IV x1 followed by 1250mg  IV Q24H and monitor CBC, Cx, levels prn.  Wynona Neat, PharmD, BCPS  12/31/2014,2:46 AM

## 2014-12-31 NOTE — ED Notes (Signed)
IV team at bedside 

## 2014-12-31 NOTE — H&P (Signed)
PCP:   Ria Bush, MD   Chief Complaint:  Left leg more swollen and red  HPI: 49 yo male h/o etoh cirrhosis (sober since October 10, 2014), chronic diastolic chf, osa comes in with worsening left leg swelling and redness.  No fevers.  Pt reports he came here sat, was diagnosed with cellulitis placed on oral keflex and bactrim, the leg has worsened since.  He had an ultrasound then also which was neg for dvt.  Pt denies any sob.  The redness is more and now starting to have some blistering, the leg is more swollen.  appetitie good.  Mentation normal.  Review of Systems:  Positive and negative as per HPI otherwise all other systems are negative  Past Medical History: Past Medical History  Diagnosis Date  . Allergy   . GERD (gastroesophageal reflux disease)   . Hyperlipidemia   . Hypertension   . Neuropathy   . Thrombocytopenia 12/15/2011  . Chronic diastolic heart failure 11/01/2618  . Alcohol dependence 07/11/2012  . Alcoholic cirrhosis of liver with ascites 07/2014  . OSA on CPAP 07/11/2012    HST 07/2013:  AHI 39/hr.     Past Surgical History  Procedure Laterality Date  . Hand surgery  1997  . Left and right heart catheterization with coronary angiogram N/Porter 06/10/2013    Procedure: LEFT AND RIGHT HEART CATHETERIZATION WITH CORONARY ANGIOGRAM;  Surgeon: Blane Ohara, MD;  Location: [facility] CATH LAB;  Service: Cardiovascular;  Laterality: N/Porter;  . Colonoscopy  08/2013    hyperplastic polyps, hemorrhoids Ardis Hughs)  . 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 Authorizing Provider  cephALEXin (KEFLEX) 500 MG capsule Take 1 capsule (500 mg total) by mouth 2 (two) times daily. 12/27/14  Yes Domenic Moras, PA-C  furosemide (LASIX) 40 MG tablet Take 1 tablet (40 mg total) by mouth 2 (two) times daily. 11/29/14  Yes Allie Bossier, MD  lactulose [facility]) 10  GM/15ML solution Take 20 g by mouth 2 (two) times daily.  12/05/14  Yes Ria Bush, MD  magnesium oxide (MAG-OX) 400 (241.3 MG) MG tablet TAKE 1 TABLET (400 MG TOTAL) BY MOUTH 2 (TWO) TIMES DAILY. 12/30/14  Yes Ria Bush, MD  MEPHYTON 5 MG tablet TAKE 2 TABLETS BY MOUTH EVERY DAY 12/30/14  Yes Ria Bush, MD  omeprazole (PRILOSEC) 20 MG capsule Take 20 mg by mouth daily. 06/30/14  Yes Historical Provider, MD  rizatriptan (MAXALT) 5 MG tablet Take 1 tablet (5 mg total) by mouth as needed for migraine. May repeat in 2 hours if needed 12/27/14  Yes Domenic Moras, PA-C  spironolactone (ALDACTONE) 50 MG tablet Take 1 tablet (50 mg total) by mouth 2 (two) times daily. Patient taking differently: Take 50 mg by mouth daily.  09/10/14  Yes Ria Bush, MD  sulfamethoxazole-trimethoprim (BACTRIM DS,SEPTRA DS) 800-160 MG per tablet Take 1 tablet by mouth 2 (two) times daily. 12/27/14 01/03/15 Yes Domenic Moras, PA-C  Tetrahydrozoline HCl (VISINE OP) Place 1 drop into both eyes as needed (for dry eyes).    Yes Historical Provider, MD  ALPRAZolam Duanne Moron) 0.5 MG tablet Take 1 tablet (0.5 mg total) by mouth 2 (two) times daily as needed for anxiety. Patient not taking: Reported on 12/31/2014 08/28/14   Ria Bush, MD  potassium chloride SA (K-DUR,KLOR-CON) 20 MEQ tablet Take 2 tablets (40 mEq total) by mouth 2 (two) times daily.  Patient not taking: Reported on 12/31/2014 11/29/14   Allie Bossier, MD    Allergies:   Allergies  Allergen Reactions  . Nsaids Other (See Comments)    cirrhosis  . Tylenol [Acetaminophen] Other (See Comments)    cirrhosis  . Hydrochlorothiazide W-Triamterene Other (See Comments)    REACTION: dizzy, nausea  . Lisinopril Other (See Comments)    REACTION: cough, decreased libido    Social History:  reports that he has never smoked. His smokeless tobacco use includes Chew. He reports that he drinks alcohol. He reports that he does not use illicit drugs.  Family  History: Family History  Problem Relation Age of Onset  . Stroke Mother   . Hypertension Father   . Heart disease Father   . Lung cancer Maternal Aunt   . Emphysema Mother   . Emphysema Father   . Heart attack Neg Hx   . Stroke Paternal Grandmother     Physical Exam: Filed Vitals:   12/30/14 2313 12/31/14 0024 12/31/14 0030 12/31/14 0100  BP: 111/67 131/60 137/63 123/55  Pulse: 89 86 86 79  Temp: 98.4 F (36.9 C)     TempSrc: Oral     Resp: 14 21 18 22   Height: 5\' 7"  (1.702 m)     Weight: 113.399 kg (250 lb)     SpO2: 96% 100% 100% 100%   General appearance: alert, cooperative and no distress Head: Normocephalic, without obvious abnormality, atraumatic Eyes: negative Nose: Nares normal. Septum midline. Mucosa normal. No drainage or sinus tenderness. Neck: no JVD and supple, symmetrical, trachea midline Lungs: clear to auscultation bilaterally Heart: regular rate and rhythm, S1, S2 normal, no murmur, click, rub or gallop Abdomen: soft, non-tender; bowel sounds normal; no masses,  no organomegaly Extremities: mild edema is rle significant swelling and eythema to lle c/w cellulitis Pulses: 2+ and symmetric Skin: Skin color, texture, turgor normal. No rashes or lesions   X as above noted to leg Neurologic: Grossly normal   Labs on Admission:   Recent Labs  12/30/14 2318  NA 133*  K 3.3*  CL 100*  CO2 24  GLUCOSE 103*  BUN 21*  CREATININE 2.06*  CALCIUM 8.2*    Recent Labs  12/30/14 2318  AST 51*  ALT 23  ALKPHOS 96  BILITOT 2.7*  PROT 7.3  ALBUMIN 2.0*    Recent Labs  12/30/14 2318  WBC 5.2  NEUTROABS 3.2  HGB 10.4*  HCT 28.7*  MCV 94.7  PLT 52*    Radiological Exams on Admission: No results found.  Case discussed with edp dr Dina Rich Old chart reviewed  Assessment/Plan  49 yo male h/o cirrhosis with lle cellulitis failed outpatient treatment with keflex/bactrim  Principal Problem:   Cellulitis-  Place on iv vancomycin.  Elevate leg.   Not septic.  US done on sat is prelim report for dvt, final report is still pending???  Will need to follow up on this final report.  Will not repeat at this time.  Active Problems:   Obesity, Class II, BMI 35-39.9, with comorbidity- noted   Essential hypertension- stable   Chronic diastolic heart failure-  stable   Alcoholic cirrhosis of liver with ascites- noted and compensated  Admit to medical bed.  Full code.  Master,Colton Porter 12/31/2014, 1:24 AM

## 2014-12-31 NOTE — ED Notes (Signed)
Admitting MD at bedside.

## 2015-01-01 LAB — BASIC METABOLIC PANEL
Anion gap: 5 (ref 5–15)
BUN: 18 mg/dL (ref 6–20)
CHLORIDE: 102 mmol/L (ref 101–111)
CO2: 26 mmol/L (ref 22–32)
Calcium: 8.5 mg/dL — ABNORMAL LOW (ref 8.9–10.3)
Creatinine, Ser: 1.86 mg/dL — ABNORMAL HIGH (ref 0.61–1.24)
GFR calc Af Amer: 48 mL/min — ABNORMAL LOW (ref >60)
GFR calc non Af Amer: 41 mL/min — ABNORMAL LOW (ref >60)
GLUCOSE: 92 mg/dL (ref 65–99)
Potassium: 3.9 mmol/L (ref 3.5–5.1)
Sodium: 133 mmol/L — ABNORMAL LOW (ref 135–145)

## 2015-01-01 MED ORDER — POTASSIUM CHLORIDE CRYS ER 20 MEQ PO TBCR
40.0000 meq | EXTENDED_RELEASE_TABLET | Freq: Two times a day (BID) | ORAL | Status: AC
  Administered 2015-01-01 (×2): 40 meq via ORAL
  Filled 2015-01-01 (×2): qty 2

## 2015-01-01 NOTE — Progress Notes (Signed)
[facility] PROGRESS NOTE   Assessment/Plan: Left leg Cellulitis/SEPSIS: - started empirically on vanc 12/30/2014. 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 Baseline Creatinine 1.8-1.4.  b-met is pending.  Alcoholic cirrhosis of liver with ascites - No signs of withdrawal, resume spironolactone once euvolemic.  Leukopenia/thrombocytopenia/anemia: - due to ETOH abuse.  Chronic diastolic heart failure - Seems to be euvolemic.  Essential hypertension Pressure improved.  Obesity, Class II, BMI 35-39.9, with comorbidity     Code Status: full Family Communication: none  Disposition Plan: home in am.   Consultants:  none  Procedures:  none  Antibiotics:  vanc  HPI/Subjective: Relates his leg feel better, leg less tender less erythema. Objective: Filed Vitals:   12/31/14 0452 12/31/14 1323 12/31/14 1940 01/01/15 0537  BP: 103/35 134/65 112/60 109/52  Pulse: 76 82 80 76  Temp: 98.4 F (36.9 C) 98.1 F (36.7 C) 99.1 F (37.3 C) 98.4 F (36.9 C)  TempSrc: Oral Oral Oral Oral  Resp: 18 18    Height:      Weight:    113.762 kg (250 lb 12.8 oz)  SpO2: 100% 99% 100% 94%    Intake/Output Summary (Last 24 hours) at 01/01/15 0948 Last data filed at 01/01/15 0352  Gross per 24 hour  Intake    650 ml  Output      0 ml  Net    650 ml   Filed Weights   12/30/14 2313 01/01/15 0537  Weight: 113.399 kg (250 lb) 113.762 kg (250 lb 12.8 oz)    Exam:  General: Alert, awake, oriented x3, in no acute distress.  HEENT: No bruits, no goiter.  Heart: Regular rate and rhythm. Lungs: Good air movement, clear Abdomen: Soft, nontender, nondistended, positive bowel sounds.    Data Reviewed: Basic Metabolic Panel:  Recent Labs Lab 12/27/14 1600 12/30/14 2318 12/31/14 0644  NA 129* 133* 134*  K 4.0 3.3* 3.1*  CL 99* 100* 103    CO2 $Re'22 24 23  'bqO$ GLUCOSE 99 103* 87  BUN 15 21* 17  CREATININE 1.38* 2.06* 1.81*  CALCIUM 8.2* 8.2* 7.9*   Liver Function Tests:  Recent Labs Lab 12/27/14 1600 12/30/14 2318  AST 69* 51*  ALT 31 23  ALKPHOS 143* 96  BILITOT 4.1* 2.7*  PROT 7.8 7.3  ALBUMIN 2.2* 2.0*   No results for input(s): LIPASE, AMYLASE in the last 168 hours. No results for input(s): AMMONIA in the last 168 hours. CBC:  Recent Labs Lab 12/27/14 1600 12/30/14 2318 12/31/14 0644  WBC 10.9* 5.2 3.6*  NEUTROABS 9.5* 3.2  --   HGB 11.8* 10.4* 9.4*  HCT 32.7* 28.7* 26.3*  MCV 95.9 94.7 95.3  PLT 44* 52* 49*   Cardiac Enzymes: No results for input(s): CKTOTAL, CKMB, CKMBINDEX, TROPONINI in the last 168 hours. BNP (last 3 results)  Recent Labs  12/27/14 1600  BNP 192.0*    ProBNP (last 3 results) No results for input(s): PROBNP in the last 8760 hours.  CBG: No results for input(s): GLUCAP in the last 168 hours.  No results found for this or any previous visit (from the past 240 hour(s)).   Studies: No results found.  Scheduled Meds: . lactulose  30 g Oral BID  . sodium chloride  3 mL Intravenous Q12H  . vancomycin  1,250 mg Intravenous Q24H   Continuous Infusions:   Time  Spent: 25 min   Charlynne Cousins  [facility] Pager [phone]. If 7PM-7AM, please contact night-coverage at [email or web address], password [facility] 01/01/2015, 9:48 AM  LOS: 1 day

## 2015-01-02 LAB — BASIC METABOLIC PANEL
Anion gap: 4 — ABNORMAL LOW (ref 5–15)
BUN: 17 mg/dL (ref 6–20)
CO2: 25 mmol/L (ref 22–32)
Calcium: 8.2 mg/dL — ABNORMAL LOW (ref 8.9–10.3)
Chloride: 103 mmol/L (ref 101–111)
Creatinine, Ser: 1.6 mg/dL — ABNORMAL HIGH (ref 0.61–1.24)
GFR, EST AFRICAN AMERICAN: 57 mL/min — AB (ref >60)
GFR, EST NON AFRICAN AMERICAN: 49 mL/min — AB (ref >60)
Glucose, Bld: 96 mg/dL (ref 65–99)
Potassium: 4.3 mmol/L (ref 3.5–5.1)
Sodium: 132 mmol/L — ABNORMAL LOW (ref 135–145)

## 2015-01-02 MED ORDER — SPIRONOLACTONE 50 MG PO TABS
50.0000 mg | ORAL_TABLET | Freq: Every day | ORAL | Status: DC
  Administered 2015-01-03: 50 mg via ORAL
  Filled 2015-01-02: qty 1

## 2015-01-02 MED ORDER — DOXYCYCLINE HYCLATE 100 MG PO TABS
100.0000 mg | ORAL_TABLET | Freq: Two times a day (BID) | ORAL | Status: DC
  Administered 2015-01-02 – 2015-01-03 (×3): 100 mg via ORAL
  Filled 2015-01-02 (×3): qty 1

## 2015-01-02 MED ORDER — FUROSEMIDE 40 MG PO TABS
40.0000 mg | ORAL_TABLET | Freq: Two times a day (BID) | ORAL | Status: DC
  Administered 2015-01-03: 40 mg via ORAL
  Filled 2015-01-02: qty 1

## 2015-01-02 NOTE — Progress Notes (Signed)
[facility] PROGRESS NOTE   Assessment/Plan: Left leg Cellulitis/SEPSIS: - started empirically on vanc 12/30/2014, de-esclate antibiotics treatment to doxy orally.  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 Creatinine at baseline1.8-1.4.   Alcoholic cirrhosis of liver with ascites - No signs of withdrawal, resume spironolactone in am.  Leukopenia/thrombocytopenia/anemia: - due to ETOH abuse.  Chronic diastolic heart failure - Seems to be euvolemic.  Essential hypertension Stable, monitor.  Obesity, Class II, BMI 35-39.9, with comorbidity     Code Status: full Family Communication: none  Disposition Plan: home in am.   Consultants:  none  Procedures:  none  Antibiotics:  vanc  HPI/Subjective: No complains wants to go home. Objective: Filed Vitals:   01/01/15 0537 01/01/15 1230 01/01/15 2047 01/02/15 0500  BP: 109/52 114/52 114/57 109/54  Pulse: 76 72 78 75  Temp: 98.4 F (36.9 C) 98.1 F (36.7 C) 98.4 F (36.9 C) 98.6 F (37 C)  TempSrc: Oral Oral Oral Oral  Resp:  18 16 18   Height:      Weight: 113.762 kg (250 lb 12.8 oz)   110.088 kg (242 lb 11.2 oz)  SpO2: 94% 100% 98% 100%    Intake/Output Summary (Last 24 hours) at 01/02/15 1023 Last data filed at 01/02/15 0843  Gross per 24 hour  Intake    880 ml  Output      0 ml  Net    880 ml   Filed Weights   12/30/14 2313 01/01/15 0537 01/02/15 0500  Weight: 113.399 kg (250 lb) 113.762 kg (250 lb 12.8 oz) 110.088 kg (242 lb 11.2 oz)    Exam:  General: Alert, awake, oriented x3, in no acute distress.  HEENT: No bruits, no goiter.  Heart: Regular rate and rhythm. Lungs: Good air movement, clear Abdomen: Soft, nontender, nondistended, positive bowel sounds.    Data Reviewed: Basic Metabolic Panel:  Recent Labs Lab 12/27/14 1600 12/30/14 2318 12/31/14 0644 01/01/15 1110  01/02/15 0454  NA 129* 133* 134* 133* 132*  K 4.0 3.3* 3.1* 3.9 4.3  CL 99* 100* 103 102 103  CO2 22 24 23 26 25   GLUCOSE 99 103* 87 92 96  BUN 15 21* 17 18 17   CREATININE 1.38* 2.06* 1.81* 1.86* 1.60*  CALCIUM 8.2* 8.2* 7.9* 8.5* 8.2*   Liver Function Tests:  Recent Labs Lab 12/27/14 1600 12/30/14 2318  AST 69* 51*  ALT 31 23  ALKPHOS 143* 96  BILITOT 4.1* 2.7*  PROT 7.8 7.3  ALBUMIN 2.2* 2.0*   No results for input(s): LIPASE, AMYLASE in the last 168 hours. No results for input(s): AMMONIA in the last 168 hours. CBC:  Recent Labs Lab 12/27/14 1600 12/30/14 2318 12/31/14 0644  WBC 10.9* 5.2 3.6*  NEUTROABS 9.5* 3.2  --   HGB 11.8* 10.4* 9.4*  HCT 32.7* 28.7* 26.3*  MCV 95.9 94.7 95.3  PLT 44* 52* 49*   Cardiac Enzymes: No results for input(s): CKTOTAL, CKMB, CKMBINDEX, TROPONINI in the last 168 hours. BNP (last 3 results)  Recent Labs  12/27/14 1600  BNP 192.0*    ProBNP (last 3 results) No results for input(s): PROBNP in the last 8760 hours.  CBG: No results for input(s): GLUCAP in the last 168 hours.  Recent Results (from the past 240 hour(s))  Blood culture (routine x 2)     Status: None (Preliminary result)   Collection Time:  12/31/14 12:51 AM  Result Value Ref Range Status   Specimen Description BLOOD RIGHT ANTECUBITAL  Final   Special Requests BOTTLES DRAWN AEROBIC AND ANAEROBIC 10CC EACH  Final   Culture NO GROWTH 1 DAY  Final   Report Status PENDING  Incomplete  Blood culture (routine x 2)     Status: None (Preliminary result)   Collection Time: 12/31/14 12:55 AM  Result Value Ref Range Status   Specimen Description BLOOD RIGHT HAND  Final   Special Requests BOTTLES DRAWN AEROBIC ONLY 10CC  Final   Culture NO GROWTH 1 DAY  Final   Report Status PENDING  Incomplete     Studies: No results found.  Scheduled Meds: . lactulose  30 g Oral BID  . sodium chloride  3 mL Intravenous Q12H  . vancomycin  1,250 mg Intravenous Q24H    Continuous Infusions:   Time Spent: 25 min   Colton Porter  [facility] Pager [phone]. If 7PM-7AM, please contact night-coverage at [email or web address], password [facility] 01/02/2015, 10:23 AM  LOS: 2 days

## 2015-01-03 MED ORDER — DOXYCYCLINE HYCLATE 100 MG PO TABS: 100.0000 mg | ORAL_TABLET | Freq: Two times a day (BID) | ORAL | Status: DC

## 2015-01-03 NOTE — Discharge Summary (Signed)
Physician Discharge Summary  Colton Porter LKG:[id number] DOB: 1965-11-04 DOA: 12/31/2014  PCP: Ria Bush, MD  Admit date: 12/31/2014 Discharge date: 01/03/2015  Time spent: 35 minutes  Recommendations for Outpatient Follow-up:  1. Follow up with PCP in 1 week  Discharge Diagnoses:  Principal Problem:   Cellulitis of left leg Active Problems:   Essential hypertension   Chronic diastolic heart failure   Alcoholic cirrhosis of liver with ascites   Obesity, Class II, BMI 35-39.9, with comorbidity   Sepsis   Discharge Condition: stable  Diet recommendation: low sodium  Filed Weights   12/30/14 2313 01/01/15 0537 01/02/15 0500  Weight: 113.399 kg (250 lb) 113.762 kg (250 lb 12.8 oz) 110.088 kg (242 lb 11.2 oz)    History of present illness:  49 yo male h/o etoh cirrhosis (sober since October 10, 2014), chronic diastolic chf, osa comes in with worsening left leg swelling and redness. No fevers. Pt reports he came here sat, was diagnosed with cellulitis placed on oral keflex and bactrim, the leg has worsened since. He had an ultrasound then also which was neg for dvt. Pt denies any sob. The redness is more and now starting to have some blistering, the leg is more swollen. appetitie good. Mentation normal.  Hospital Course:  Left leg Cellulitis/SEPSIS: - started empirically on vanc 12/30/2014. - became afebrile, change to doxy which he will cont as an outpatien.  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 Creatinine at baseline1.8-1.4.   Alcoholic cirrhosis of liver with ascites - No signs of withdrawal, resume spironolactone in am.  Leukopenia/thrombocytopenia/anemia: - due to ETOH abuse.  Chronic diastolic heart failure - Seems to be euvolemic.   Procedures:  none  Consultations:  none  Discharge Exam: Filed Vitals:   01/03/15 0516  BP: 101/55   Pulse: 72  Temp: 98.5 F (36.9 C)  Resp: 16    General: A&O x3 Cardiovascular: RRR Respiratory: good air movement CTA B/L  Discharge Instructions   Discharge Instructions    Diet - low sodium heart healthy    Complete by:  As directed      Increase activity slowly    Complete by:  As directed           Current Discharge Medication List    START taking these medications   Details  doxycycline (VIBRA-TABS) 100 MG tablet Take 1 tablet (100 mg total) by mouth every 12 (twelve) hours. Qty: 14 tablet, Refills: 0      CONTINUE these medications which have NOT CHANGED   Details  cephALEXin (KEFLEX) 500 MG capsule Take 1 capsule (500 mg total) by mouth 2 (two) times daily. Qty: 40 capsule, Refills: 0    furosemide (LASIX) 40 MG tablet Take 1 tablet (40 mg total) by mouth 2 (two) times daily. Qty: 90 tablet, Refills: 3    lactulose (CHRONULAC) 10 GM/15ML solution Take 30 g by mouth 2 (two) times daily.     magnesium oxide (MAG-OX) 400 (241.3 MG) MG tablet TAKE 1 TABLET (400 MG TOTAL) BY MOUTH 2 (TWO) TIMES DAILY. Qty: 60 tablet, Refills: 3    MEPHYTON 5 MG tablet TAKE 2 TABLETS BY MOUTH EVERY DAY Qty: 60 tablet, Refills: 3    omeprazole (PRILOSEC) 20 MG capsule Take 20 mg by mouth daily. Refills: 3    rizatriptan (MAXALT) 5 MG tablet Take 1 tablet (5 mg total)  by mouth as needed for migraine. May repeat in 2 hours if needed Qty: 10 tablet, Refills: 0    spironolactone (ALDACTONE) 50 MG tablet Take 1 tablet (50 mg total) by mouth 2 (two) times daily. Qty: 60 tablet, Refills: 3    Tetrahydrozoline HCl (VISINE OP) Place 1 drop into both eyes as needed (for dry eyes).     ALPRAZolam (XANAX) 0.5 MG tablet Take 1 tablet (0.5 mg total) by mouth 2 (two) times daily as needed for anxiety. Qty: 30 tablet, Refills: 0    potassium chloride SA (K-DUR,KLOR-CON) 20 MEQ tablet Take 2 tablets (40 mEq total) by mouth 2 (two) times daily. Qty: 60 tablet, Refills: 0      STOP  taking these medications     sulfamethoxazole-trimethoprim (BACTRIM DS,SEPTRA DS) 800-160 MG per tablet        Allergies  Allergen Reactions  . Nsaids Other (See Comments)    cirrhosis  . Tylenol [Acetaminophen] Other (See Comments)    cirrhosis  . Hydrochlorothiazide W-Triamterene Other (See Comments)    REACTION: dizzy, nausea  . Lisinopril Other (See Comments)    REACTION: cough, decreased libido      The results of significant diagnostics from this hospitalization (including imaging, microbiology, ancillary and laboratory) are listed below for reference.    Significant Diagnostic Studies: No results found.  Microbiology: Recent Results (from the past 240 hour(s))  Blood culture (routine x 2)     Status: None (Preliminary result)   Collection Time: 12/31/14 12:51 AM  Result Value Ref Range Status   Specimen Description BLOOD RIGHT ANTECUBITAL  Final   Special Requests BOTTLES DRAWN AEROBIC AND ANAEROBIC 10CC EACH  Final   Culture NO GROWTH 2 DAYS  Final   Report Status PENDING  Incomplete  Blood culture (routine x 2)     Status: None (Preliminary result)   Collection Time: 12/31/14 12:55 AM  Result Value Ref Range Status   Specimen Description BLOOD RIGHT HAND  Final   Special Requests BOTTLES DRAWN AEROBIC ONLY 10CC  Final   Culture NO GROWTH 2 DAYS  Final   Report Status PENDING  Incomplete     Labs: Basic Metabolic Panel:  Recent Labs Lab 12/27/14 1600 12/30/14 2318 12/31/14 0644 01/01/15 1110 01/02/15 0454  NA 129* 133* 134* 133* 132*  K 4.0 3.3* 3.1* 3.9 4.3  CL 99* 100* 103 102 103  CO2 22 24 23 26 25   GLUCOSE 99 103* 87 92 96  BUN 15 21* 17 18 17   CREATININE 1.38* 2.06* 1.81* 1.86* 1.60*  CALCIUM 8.2* 8.2* 7.9* 8.5* 8.2*   Liver Function Tests:  Recent Labs Lab 12/27/14 1600 12/30/14 2318  AST 69* 51*  ALT 31 23  ALKPHOS 143* 96  BILITOT 4.1* 2.7*  PROT 7.8 7.3  ALBUMIN 2.2* 2.0*   No results for input(s): LIPASE, AMYLASE in the  last 168 hours. No results for input(s): AMMONIA in the last 168 hours. CBC:  Recent Labs Lab 12/27/14 1600 12/30/14 2318 12/31/14 0644  WBC 10.9* 5.2 3.6*  NEUTROABS 9.5* 3.2  --   HGB 11.8* 10.4* 9.4*  HCT 32.7* 28.7* 26.3*  MCV 95.9 94.7 95.3  PLT 44* 52* 49*   Cardiac Enzymes: No results for input(s): CKTOTAL, CKMB, CKMBINDEX, TROPONINI in the last 168 hours. BNP: BNP (last 3 results)  Recent Labs  12/27/14 1600  BNP 192.0*    ProBNP (last 3 results) No results for input(s): PROBNP in the last 8760 hours.  CBG:  No results for input(s): GLUCAP in the last 168 hours.     Signed:  Charlynne Cousins  [facility] 01/03/2015, 10:19 AM

## 2015-01-05 ENCOUNTER — Telehealth: Payer: Self-pay | Admitting: Family Medicine

## 2015-01-05 LAB — CULTURE, BLOOD (ROUTINE X 2)
CULTURE: NO GROWTH
Culture: NO GROWTH

## 2015-01-06 NOTE — Telephone Encounter (Signed)
Patient discharged on 7/9 for cellulitis with sepsis.  plz call for hosp f/u phone call and schedule f/u appt in 1-2 week(s).  Thanks.

## 2015-01-07 ENCOUNTER — Ambulatory Visit (INDEPENDENT_AMBULATORY_CARE_PROVIDER_SITE_OTHER): Payer: BLUE CROSS/BLUE SHIELD | Admitting: Gastroenterology

## 2015-01-07 ENCOUNTER — Other Ambulatory Visit (INDEPENDENT_AMBULATORY_CARE_PROVIDER_SITE_OTHER): Payer: BLUE CROSS/BLUE SHIELD

## 2015-01-07 ENCOUNTER — Encounter: Payer: Self-pay | Admitting: Gastroenterology

## 2015-01-07 VITALS — BP 124/62 | HR 80 | Ht 66.75 in | Wt 251.0 lb

## 2015-01-07 DIAGNOSIS — K7469 Other cirrhosis of liver: Secondary | ICD-10-CM

## 2015-01-07 DIAGNOSIS — K7030 Alcoholic cirrhosis of liver without ascites: Secondary | ICD-10-CM

## 2015-01-07 LAB — COMPREHENSIVE METABOLIC PANEL
ALBUMIN: 2.1 g/dL — AB (ref 3.5–5.2)
ALT: 22 U/L (ref 0–53)
AST: 48 U/L — AB (ref 0–37)
Alkaline Phosphatase: 139 U/L — ABNORMAL HIGH (ref 39–117)
BILIRUBIN TOTAL: 2.1 mg/dL — AB (ref 0.2–1.2)
BUN: 15 mg/dL (ref 6–23)
CO2: 29 meq/L (ref 19–32)
CREATININE: 1.42 mg/dL (ref 0.40–1.50)
Calcium: 8.5 mg/dL (ref 8.4–10.5)
Chloride: 100 mEq/L (ref 96–112)
GFR: 56.48 mL/min — AB (ref >60.00)
Glucose, Bld: 126 mg/dL — ABNORMAL HIGH (ref 70–99)
POTASSIUM: 3.5 meq/L (ref 3.5–5.1)
Sodium: 136 mEq/L (ref 135–145)
TOTAL PROTEIN: 7.6 g/dL (ref 6.0–8.3)

## 2015-01-07 LAB — FERRITIN: Ferritin: 592.1 ng/mL — ABNORMAL HIGH (ref 22.0–322.0)

## 2015-01-07 LAB — IBC PANEL
Iron: 87 ug/dL (ref 42–165)
SATURATION RATIOS: 42 % (ref 20.0–50.0)
Transferrin: 148 mg/dL — ABNORMAL LOW (ref 212.0–360.0)

## 2015-01-07 LAB — HEPATITIS A ANTIBODY, TOTAL: Hep A Total Ab: NONREACTIVE

## 2015-01-07 MED ORDER — MAGNESIUM OXIDE 400 (241.3 MG) MG PO TABS: 400.0000 mg | ORAL_TABLET | Freq: Every day | ORAL | Status: DC

## 2015-01-07 NOTE — Patient Instructions (Addendum)
You have been given a separate informational sheet regarding your tobacco use, the importance of quitting and local resources to help you quit. You will have labs checked today in the basement lab.  Please head down after you check out with the front desk  (cmet, hepatitis A total A, total iron, ferritin, TIBC, ANA, AMA, anti smooth muscle antibody, alpha 1 antitrypsin, cerulloplasm) Referral to [facility] liver clinic for initial evaluation for liver transplant.  You will receive a call from [facility] with an appointment the phone number to call is [phone].  They are located at [address]  [address] Continue lactulose twice daily. Decrease your magnesium to once daily. Please return to see Dr. Ardis Hughs on 03/18/15 at 11 am.

## 2015-01-07 NOTE — Progress Notes (Signed)
Review of pertinent gastrointestinal problems: 1. Cirrhosis (likely etoh related from alcoholism, "as many as 20 beers per day"); diagnosed 08/2014 by [name].  INR 1.5, low platelets, + mild ascites; Hep B surface Ag neg, Hep B surface Ab neg, HCV Ab neg, HIV neg.  Imaging: [name] 3/16 showed cirrhosis, splenomegaly, no discrete liver lesions; no obvious ascites  AFP 08/2014 7.5 (slightly elevated)  EGD 09/2014 no esophageal or gastric varices, + mild portal gastropathy. 2. Routine risk for colon cancer: colonoscopy Dr. Ardis Hughs 08/2013 for minor rectal bleeding: 3 polyps (all HPs), hemorrhoids.  Recall colonoscopy at 10 year interval recommended.   HPI: This is a    very pleasant 49 year old man who is here with his wife today  Chief complaint is cirrhosis  Zero Etoh since April 18th.  He is committed to never resuming drinking. Down 13 pounds in 4 months.  Recently admitted with cellulitis, AKI  His biggest complaint is lower extremity swelling.  Past Medical History  Diagnosis Date  . Allergy   . GERD (gastroesophageal reflux disease)   . Hyperlipidemia   . Hypertension   . Neuropathy   . Thrombocytopenia 12/15/2011  . Chronic diastolic heart failure 10/30/9792  . Alcohol dependence 07/11/2012  . Alcoholic cirrhosis of liver with ascites 07/2014  . OSA on CPAP 07/11/2012    HST 07/2013:  AHI 39/hr.    . Cellulitis     leg    Past Surgical History  Procedure Laterality Date  . Hand surgery  1997  . Left and right heart catheterization with coronary angiogram N/A 06/10/2013    Procedure: LEFT AND RIGHT HEART CATHETERIZATION WITH CORONARY ANGIOGRAM;  Surgeon: Blane Ohara, MD;  Location: [facility] CATH LAB;  Service: Cardiovascular;  Laterality: N/A;  . Colonoscopy  08/2013    hyperplastic polyps, hemorrhoids Ardis Hughs)  . Esophagogastroduodenoscopy  09/2014    portal gastropathy without varices Ardis Hughs)    Current Outpatient Prescriptions  Medication Sig Dispense Refill  . doxycycline  (VIBRA-TABS) 100 MG tablet Take 1 tablet (100 mg total) by mouth every 12 (twelve) hours. 14 tablet 0  . furosemide (LASIX) 40 MG tablet Take 1 tablet (40 mg total) by mouth 2 (two) times daily. 90 tablet 3  . lactulose (CHRONULAC) 10 GM/15ML solution Take 30 g by mouth 2 (two) times daily.     . magnesium oxide (MAG-OX) 400 (241.3 MG) MG tablet TAKE 1 TABLET (400 MG TOTAL) BY MOUTH 2 (TWO) TIMES DAILY. 60 tablet 3  . MEPHYTON 5 MG tablet TAKE 2 TABLETS BY MOUTH EVERY DAY 60 tablet 3  . omeprazole (PRILOSEC) 20 MG capsule Take 20 mg by mouth daily.  3  . rizatriptan (MAXALT) 5 MG tablet Take 1 tablet (5 mg total) by mouth as needed for migraine. May repeat in 2 hours if needed 10 tablet 0  . spironolactone (ALDACTONE) 50 MG tablet Take 50 mg by mouth daily.    . Tetrahydrozoline HCl (VISINE OP) Place 1 drop into both eyes as needed (for dry eyes).      No current facility-administered medications for this visit.    Allergies as of 01/07/2015 - Review Complete 01/07/2015  Allergen Reaction Noted  . Nsaids Other (See Comments) 12/27/2014  . Tylenol [acetaminophen] Other (See Comments) 12/27/2014  . Hydrochlorothiazide w-triamterene Other (See Comments) 08/07/2006  . Lisinopril Other (See Comments) 08/07/2006    Family History  Problem Relation Age of Onset  . Stroke Mother   . Hypertension Father   . Heart disease Father   .  Lung cancer Maternal Aunt   . Emphysema Mother   . Emphysema Father   . Heart attack Neg Hx   . Stroke Paternal Grandmother     History   Social History  . Marital Status: Married    Spouse Name: N/A  . Number of Children: 2  . Years of Education: N/A   Occupational History  . [employer]     no work lately   Social History Main Topics  . Smoking status: Never Smoker   . Smokeless tobacco: Current User    Types: Chew  . Alcohol Use: 0.0 oz/week    0 Standard drinks or equivalent per week     Comment: 4-6 drinks daily - NO ETOH SINCE APRIL  2016  . Drug Use: No  . Sexual Activity: No   Other Topics Concern  . Not on file   Social History Narrative         Physical Exam: BP 124/62 mmHg  Pulse 80  Ht 5' 6.75" (1.695 m)  Wt 251 lb (113.853 kg)  BMI 39.63 kg/m2 Constitutional: generally well-appearing Psychiatric: alert and oriented x3 Abdomen: soft, nontender, nondistended, no obvious ascites, no peritoneal signs, normal bowel sounds 1-2+ pitting edema bilaterally with resolving cellulitis on left ankle  Assessment and plan: 49 y.o. male with alcohol related cirrhosis  She does have some mild disorientation at times sleep disturbance and so I do think he has mild encephalopathy. He will continue on lactulose twice daily. I recommended he cut back his magnesium oxide to one pill once daily since he is bothered by sometimes very loose stools at night. I do not want to cut back on his lactulose. His creatinine bumps to around 2 in this most recent hospitalization. Will recheck that today. He is bothered by lower extremity edema and not sure if this is just from his cirrhosis since he has no ascites on examination or ultrasound. He is on both  Aldactone and Lasix at relatively medium doses. I do not plan to adjust those just yet but may depending on what his kidney function is.  He needs further lab testing to exclude other potential causes of his cirrhosis, see the list below. He is also completely alcohol free for about 3 months now and we are going to work on referral to [facility] satellite clinic here in town for initial transplant evaluation. He'll get hepatitis A testing and we will begin immunization series for hepatitis A and B after seeing that result. He'll return to see me in 6-8 weeks and sooner if needed  Owens Loffler, MD [facility] 01/07/2015, 8:48 AM

## 2015-01-08 ENCOUNTER — Encounter: Payer: Self-pay | Admitting: Family Medicine

## 2015-01-08 ENCOUNTER — Ambulatory Visit (INDEPENDENT_AMBULATORY_CARE_PROVIDER_SITE_OTHER): Payer: BLUE CROSS/BLUE SHIELD | Admitting: Family Medicine

## 2015-01-08 VITALS — BP 110/70 | HR 84 | Temp 98.1°F | Wt 248.5 lb

## 2015-01-08 DIAGNOSIS — K7290 Hepatic failure, unspecified without coma: Secondary | ICD-10-CM | POA: Diagnosis not present

## 2015-01-08 DIAGNOSIS — R600 Localized edema: Secondary | ICD-10-CM

## 2015-01-08 DIAGNOSIS — L03116 Cellulitis of left lower limb: Secondary | ICD-10-CM | POA: Diagnosis not present

## 2015-01-08 DIAGNOSIS — N179 Acute kidney failure, unspecified: Secondary | ICD-10-CM

## 2015-01-08 DIAGNOSIS — R5383 Other fatigue: Secondary | ICD-10-CM

## 2015-01-08 DIAGNOSIS — K7682 Hepatic encephalopathy: Secondary | ICD-10-CM

## 2015-01-08 DIAGNOSIS — R140 Abdominal distension (gaseous): Secondary | ICD-10-CM

## 2015-01-08 DIAGNOSIS — K7031 Alcoholic cirrhosis of liver with ascites: Secondary | ICD-10-CM

## 2015-01-08 LAB — MITOCHONDRIAL ANTIBODIES: MITOCHONDRIAL M2 AB, IGG: 0.71 (ref <0.91)

## 2015-01-08 LAB — ANA: Anti Nuclear Antibody(ANA): NEGATIVE

## 2015-01-08 NOTE — Assessment & Plan Note (Signed)
Continue lactulose 20gm BID.

## 2015-01-08 NOTE — Progress Notes (Signed)
BP 110/70 mmHg  Pulse 84  Temp(Src) 98.1 F (36.7 C) (Oral)  Wt 248 lb 8 oz (112.719 kg)   CC: hosp f/u visit  Subjective:    Patient ID: Colton Porter, male    DOB: 01-08-1966, 49 y.o.   MRN: [id number]  HPI: Colton Porter is a 49 y.o. male presenting on 01/08/2015 for Follow-up   Recent hospitalization for L leg cellulitis. Treated initially with oral keflex and bactrim, but sxs worsened. [name] negative for DVT. Initially started on vanc, transitioned to oral doxy to complete 10d course. Also found to have acute kidney injury with Cr 1.8.   Also saw Dr Ardis Hughs yesterday, to be referred to tertiary center to discuss liver transplant.   Persistent pedal edema.   Admit date: 12/31/2014 Discharge date: 01/03/2015 F/u phone call: not performed  Recommendations for Outpatient Follow-up:  1. Follow up with PCP in 1 week  Discharge Diagnoses:  Principal Problem:  Cellulitis of left leg Active Problems:  Essential hypertension  Chronic diastolic heart failure  Alcoholic cirrhosis of liver with ascites  Obesity, Class II, BMI 35-39.9, with comorbidity  Sepsis  Relevant past medical, surgical, family and social history reviewed and updated as indicated. Interim medical history since our last visit reviewed. Allergies and medications reviewed and updated. Current Outpatient Prescriptions on File Prior to Visit  Medication Sig  . doxycycline (VIBRA-TABS) 100 MG tablet Take 1 tablet (100 mg total) by mouth every 12 (twelve) hours.  . furosemide (LASIX) 40 MG tablet Take 1 tablet (40 mg total) by mouth 2 (two) times daily.  Marland Kitchen lactulose (CHRONULAC) 10 GM/15ML solution Take 30 g by mouth 2 (two) times daily.   . magnesium oxide (MAG-OX) 400 (241.3 MG) MG tablet Take 1 tablet (400 mg total) by mouth daily.  . MEPHYTON 5 MG tablet TAKE 2 TABLETS BY MOUTH EVERY DAY  . omeprazole (PRILOSEC) 20 MG capsule Take 20 mg by mouth daily.  . rizatriptan (MAXALT) 5 MG tablet Take 1 tablet  (5 mg total) by mouth as needed for migraine. May repeat in 2 hours if needed  . spironolactone (ALDACTONE) 50 MG tablet Take 50 mg by mouth daily.  . Tetrahydrozoline HCl (VISINE OP) Place 1 drop into both eyes as needed (for dry eyes).    No current facility-administered medications on file prior to visit.    Review of Systems Per HPI unless specifically indicated above     Objective:    BP 110/70 mmHg  Pulse 84  Temp(Src) 98.1 F (36.7 C) (Oral)  Wt 248 lb 8 oz (112.719 kg)  Wt Readings from Last 3 Encounters:  01/08/15 248 lb 8 oz (112.719 kg)  01/07/15 251 lb (113.853 kg)  01/02/15 242 lb 11.2 oz (110.088 kg)    Physical Exam  Constitutional: He is oriented to person, place, and time. He appears well-developed and well-nourished. No distress.  HENT:  Mouth/Throat: Oropharynx is clear and moist. No oropharyngeal exudate.  Cardiovascular: Normal rate, regular rhythm, normal heart sounds and intact distal pulses.   No murmur heard. Pulmonary/Chest: Effort normal and breath sounds normal. No respiratory distress. He has no wheezes. He has no rales.  Musculoskeletal: He exhibits edema (marked nonpitting pedal edema).  Neurological: He is alert and oriented to person, place, and time.  Skin: Skin is warm and dry. No rash noted. There is erythema.  Erythema anterior L lower leg  Psychiatric: He has a normal mood and affect.  Nursing note and vitals reviewed.  Results for orders placed or performed in visit on 01/07/15  Hepatitis A antibody, total  Result Value Ref Range   Hep A Total Ab NON REACTIVE NON REACTIVE  ANA  Result Value Ref Range   Anit Nuclear Antibody(ANA) NEG NEGATIVE  Anti-smooth muscle antibody, IgG  Result Value Ref Range   Smooth Muscle Ab  <20 U  Alpha-1-antitrypsin  Result Value Ref Range   A-1 Antitrypsin, Ser    Mitochondrial antibodies  Result Value Ref Range   Mitochondrial M2 Ab, IgG  <0.91  Ceruloplasmin  Result Value Ref Range    Ceruloplasmin    IBC panel  Result Value Ref Range   Iron 87 42 - 165 ug/dL   Transferrin 148.0 (L) 212.0 - 360.0 mg/dL   Saturation Ratios 42.0 20.0 - 50.0 %  Ferritin  Result Value Ref Range   Ferritin 592.1 (H) 22.0 - 322.0 ng/mL  Comprehensive metabolic panel  Result Value Ref Range   Sodium 136 135 - 145 mEq/L   Potassium 3.5 3.5 - 5.1 mEq/L   Chloride 100 96 - 112 mEq/L   CO2 29 19 - 32 mEq/L   Glucose, Bld 126 (H) 70 - 99 mg/dL   BUN 15 6 - 23 mg/dL   Creatinine, Ser 1.42 0.40 - 1.50 mg/dL   Total Bilirubin 2.1 (H) 0.2 - 1.2 mg/dL   Alkaline Phosphatase 139 (H) 39 - 117 U/L   AST 48 (H) 0 - 37 U/L   ALT 22 0 - 53 U/L   Total Protein 7.6 6.0 - 8.3 g/dL   Albumin 2.1 (L) 3.5 - 5.2 g/dL   Calcium 8.5 8.4 - 10.5 mg/dL   GFR 56.48 (L) >60.00 mL/min      Assessment & Plan:  Return to work letter provided for wife.  Problem List Items Addressed This Visit    Acute kidney injury    Continues improving as of last Cr this week. Continue lasix 40mg  bid, spironolactone 50mg  once daily.      Alcoholic cirrhosis of liver with ascites    Has reached 90d sober period - has been referred to establish with transplant clinic.      Cellulitis of left leg - Primary    Slowly improving on doxy. Discussed anticipated course of resolution.      Gassiness    rec trial simethicone OTC.      Hepatic encephalopathy    Continue lactulose 20gm BID.      Hypomagnesemia    Mag ox decreased to 400mg  QDaily, may have improved diarrhea.      Other fatigue    Actually improving.      Pedal edema    Predominant concern for patient. Thought related to liver disease and hypoalbuminemia. Continue current lasix/spironolactone dose, consider returning to spironolactone 50mg  bid (but this was decreased during AKI in hospitalization)          Follow up plan: Return in about 6 weeks (around 02/19/2015), or as needed, for follow up visit.

## 2015-01-08 NOTE — Progress Notes (Signed)
Pre visit review using our clinic review tool, if applicable. No additional management support is needed unless otherwise documented below in the visit note. 

## 2015-01-08 NOTE — Patient Instructions (Addendum)
Finish doxycycline. Take gas X over the counter for symptomatic relief. Letter provided today. Return to see me in 6 weeks for follow up

## 2015-01-08 NOTE — Assessment & Plan Note (Signed)
Mag ox decreased to 400mg  QDaily, may have improved diarrhea.

## 2015-01-08 NOTE — Assessment & Plan Note (Signed)
Has reached 90d sober period - has been referred to establish with transplant clinic.

## 2015-01-08 NOTE — Telephone Encounter (Signed)
Follow up scheduled today (7/14).

## 2015-01-08 NOTE — Assessment & Plan Note (Addendum)
Predominant concern for patient. Thought related to liver disease and hypoalbuminemia. Continue current lasix/spironolactone dose, consider returning to spironolactone 50mg  bid (but this was decreased during AKI in hospitalization)

## 2015-01-08 NOTE — Assessment & Plan Note (Signed)
Continues improving as of last Cr this week. Continue lasix 40mg  bid, spironolactone 50mg  once daily.

## 2015-01-08 NOTE — Assessment & Plan Note (Signed)
Slowly improving on doxy. Discussed anticipated course of resolution.

## 2015-01-08 NOTE — Assessment & Plan Note (Addendum)
rec trial simethicone OTC.

## 2015-01-08 NOTE — Assessment & Plan Note (Signed)
Actually improving.

## 2015-01-09 LAB — CERULOPLASMIN: CERULOPLASMIN: 18 mg/dL (ref 18–36)

## 2015-01-09 LAB — ANTI-SMOOTH MUSCLE ANTIBODY, IGG: Smooth Muscle Ab: 14 U (ref <20)

## 2015-01-09 LAB — ALPHA-1-ANTITRYPSIN: A-1 Antitrypsin, Ser: 157 mg/dL (ref 83–199)

## 2015-01-12 ENCOUNTER — Other Ambulatory Visit: Payer: Self-pay

## 2015-01-12 DIAGNOSIS — K7030 Alcoholic cirrhosis of liver without ascites: Secondary | ICD-10-CM

## 2015-01-12 DIAGNOSIS — R945 Abnormal results of liver function studies: Secondary | ICD-10-CM

## 2015-01-12 DIAGNOSIS — R7989 Other specified abnormal findings of blood chemistry: Secondary | ICD-10-CM

## 2015-01-16 ENCOUNTER — Ambulatory Visit (INDEPENDENT_AMBULATORY_CARE_PROVIDER_SITE_OTHER): Payer: BLUE CROSS/BLUE SHIELD | Admitting: Gastroenterology

## 2015-01-16 DIAGNOSIS — Z23 Encounter for immunization: Secondary | ICD-10-CM

## 2015-01-16 DIAGNOSIS — K7030 Alcoholic cirrhosis of liver without ascites: Secondary | ICD-10-CM

## 2015-01-22 ENCOUNTER — Telehealth: Payer: Self-pay

## 2015-01-22 NOTE — Telephone Encounter (Signed)
Pt has appt with [facility] with Roosevelt Locks NP on 02/18/15 10:15 am  Pt has been notified

## 2015-01-23 ENCOUNTER — Ambulatory Visit (INDEPENDENT_AMBULATORY_CARE_PROVIDER_SITE_OTHER): Payer: BLUE CROSS/BLUE SHIELD | Admitting: Gastroenterology

## 2015-01-23 DIAGNOSIS — Z23 Encounter for immunization: Secondary | ICD-10-CM

## 2015-01-23 DIAGNOSIS — K7030 Alcoholic cirrhosis of liver without ascites: Secondary | ICD-10-CM

## 2015-02-02 ENCOUNTER — Other Ambulatory Visit: Payer: Self-pay | Admitting: Family Medicine

## 2015-02-06 ENCOUNTER — Ambulatory Visit (INDEPENDENT_AMBULATORY_CARE_PROVIDER_SITE_OTHER): Payer: BLUE CROSS/BLUE SHIELD | Admitting: Gastroenterology

## 2015-02-06 DIAGNOSIS — K7030 Alcoholic cirrhosis of liver without ascites: Secondary | ICD-10-CM

## 2015-02-06 DIAGNOSIS — Z23 Encounter for immunization: Secondary | ICD-10-CM

## 2015-02-10 ENCOUNTER — Ambulatory Visit: Payer: BLUE CROSS/BLUE SHIELD | Admitting: Gastroenterology

## 2015-02-19 ENCOUNTER — Ambulatory Visit (INDEPENDENT_AMBULATORY_CARE_PROVIDER_SITE_OTHER): Payer: BLUE CROSS/BLUE SHIELD | Admitting: Family Medicine

## 2015-02-19 ENCOUNTER — Encounter: Payer: Self-pay | Admitting: Family Medicine

## 2015-02-19 VITALS — BP 130/62 | HR 93 | Temp 98.2°F | Wt 247.0 lb

## 2015-02-19 DIAGNOSIS — D619 Aplastic anemia, unspecified: Secondary | ICD-10-CM

## 2015-02-19 DIAGNOSIS — R51 Headache: Secondary | ICD-10-CM

## 2015-02-19 DIAGNOSIS — F1021 Alcohol dependence, in remission: Secondary | ICD-10-CM | POA: Diagnosis not present

## 2015-02-19 DIAGNOSIS — I10 Essential (primary) hypertension: Secondary | ICD-10-CM

## 2015-02-19 DIAGNOSIS — R519 Headache, unspecified: Secondary | ICD-10-CM | POA: Insufficient documentation

## 2015-02-19 DIAGNOSIS — R600 Localized edema: Secondary | ICD-10-CM

## 2015-02-19 DIAGNOSIS — K7030 Alcoholic cirrhosis of liver without ascites: Secondary | ICD-10-CM | POA: Diagnosis not present

## 2015-02-19 DIAGNOSIS — K7290 Hepatic failure, unspecified without coma: Secondary | ICD-10-CM

## 2015-02-19 DIAGNOSIS — K7682 Hepatic encephalopathy: Secondary | ICD-10-CM

## 2015-02-19 DIAGNOSIS — D61818 Other pancytopenia: Secondary | ICD-10-CM

## 2015-02-19 MED ORDER — RIZATRIPTAN BENZOATE 5 MG PO TABS: 5.0000 mg | ORAL_TABLET | ORAL | Status: DC | PRN

## 2015-02-19 NOTE — Assessment & Plan Note (Signed)
Lactulose decreased to 30gm daily and pt tolerating well.

## 2015-02-19 NOTE — Progress Notes (Signed)
BP 130/62 mmHg  Pulse 93  Temp(Src) 98.2 F (36.8 C) (Oral)  Wt 247 lb (112.038 kg)  SpO2 96%   CC: 6 wk f/u visit  Subjective:    Patient ID: Colton Porter, male    DOB: October 11, 1965, 49 y.o.   MRN: [id number]  HPI: Colton Porter is a 49 y.o. male presenting on 02/19/2015 for Follow-up   Known alcoholic cirrhosis with splenomegaly, thrombocytopenia (actually pancytopenia), and hypoalbuminemia. OSA on CPAP. Since last seen here, saw Dr Ardis Hughs - mild encephalopathy due to mild disorientation - rec continue lactulose BID. Mag oxide decreased 2/2 diarrhea. Referred to [facility] to start transplant evaluation. Received Hep A/B series  Lab Results  Component Value Date   CREATININE 1.42 01/07/2015    Intermittent headaches with phonophobia but no nausea. No auras. Activity limiting headaches. Describes posterior dull ache with pressure. No neck stiffness. + sinus pressure with headaches. No recent headaches. Prescribed maxalt for headaches. This has helped. Told by liver clinic ok to take 2000 gm tylenol/day. Had labwork done yesterday.   Disability evaluation - approved for disability - start drawing 02/2015. Will be on disability for 2 yrs prior to receiving medicare.   Relevant past medical, surgical, family and social history reviewed and updated as indicated. Interim medical history since our last visit reviewed. Allergies and medications reviewed and updated. Current Outpatient Prescriptions on File Prior to Visit  Medication Sig  . furosemide (LASIX) 40 MG tablet Take 1 tablet (40 mg total) by mouth 2 (two) times daily.  Marland Kitchen lactulose (CHRONULAC) 10 GM/15ML solution Take 30 g by mouth daily.   . magnesium oxide (MAG-OX) 400 (241.3 MG) MG tablet Take 1 tablet (400 mg total) by mouth daily.  . MEPHYTON 5 MG tablet TAKE 2 TABLETS BY MOUTH EVERY DAY  . omeprazole (PRILOSEC) 20 MG capsule Take 20 mg by mouth daily.  . rizatriptan (MAXALT) 5 MG tablet Take 1 tablet (5 mg total) by mouth  as needed for migraine. May repeat in 2 hours if needed  . spironolactone (ALDACTONE) 50 MG tablet Take 50 mg by mouth daily.  . Tetrahydrozoline HCl (VISINE OP) Place 1 drop into both eyes as needed (for dry eyes).    No current facility-administered medications on file prior to visit.    Review of Systems Per HPI unless specifically indicated above     Objective:    BP 130/62 mmHg  Pulse 93  Temp(Src) 98.2 F (36.8 C) (Oral)  Wt 247 lb (112.038 kg)  SpO2 96%  Wt Readings from Last 3 Encounters:  02/19/15 247 lb (112.038 kg)  01/08/15 248 lb 8 oz (112.719 kg)  01/07/15 251 lb (113.853 kg)    Physical Exam  Constitutional: He appears well-developed and well-nourished. No distress.  HENT:  Mouth/Throat: Oropharynx is clear and moist. No oropharyngeal exudate.  Eyes: Conjunctivae and EOM are normal. Pupils are equal, round, and reactive to light. No scleral icterus.  Cardiovascular: Normal rate, regular rhythm and intact distal pulses.   Murmur (2/6 SEM at upper sternal border) heard. Pulmonary/Chest: Effort normal and breath sounds normal. No respiratory distress. He has no wheezes. He has no rales.  Musculoskeletal: He exhibits edema (nonpitting).  Skin: Skin is warm and dry. No rash noted.  Nursing note and vitals reviewed.  Results for orders placed or performed in visit on 01/07/15  Hepatitis A antibody, total  Result Value Ref Range   Hep A Total Ab NON REACTIVE NON REACTIVE  ANA  Result  Value Ref Range   Anit Nuclear Antibody(ANA) NEG NEGATIVE  Anti-smooth muscle antibody, IgG  Result Value Ref Range   Smooth Muscle Ab 14 <20 U  Alpha-1-antitrypsin  Result Value Ref Range   A-1 Antitrypsin, Ser 157 83 - 199 mg/dL  Mitochondrial antibodies  Result Value Ref Range   Mitochondrial M2 Ab, IgG 0.71 <0.91  Ceruloplasmin  Result Value Ref Range   Ceruloplasmin 18 18 - 36 mg/dL  IBC panel  Result Value Ref Range   Iron 87 42 - 165 ug/dL   Transferrin 148.0 (L)  212.0 - 360.0 mg/dL   Saturation Ratios 42.0 20.0 - 50.0 %  Ferritin  Result Value Ref Range   Ferritin 592.1 (H) 22.0 - 322.0 ng/mL  Comprehensive metabolic panel  Result Value Ref Range   Sodium 136 135 - 145 mEq/L   Potassium 3.5 3.5 - 5.1 mEq/L   Chloride 100 96 - 112 mEq/L   CO2 29 19 - 32 mEq/L   Glucose, Bld 126 (H) 70 - 99 mg/dL   BUN 15 6 - 23 mg/dL   Creatinine, Ser 1.42 0.40 - 1.50 mg/dL   Total Bilirubin 2.1 (H) 0.2 - 1.2 mg/dL   Alkaline Phosphatase 139 (H) 39 - 117 U/L   AST 48 (H) 0 - 37 U/L   ALT 22 0 - 53 U/L   Total Protein 7.6 6.0 - 8.3 g/dL   Albumin 2.1 (L) 3.5 - 5.2 g/dL   Calcium 8.5 8.4 - 10.5 mg/dL   GFR 56.48 (L) >60.00 mL/min      Assessment & Plan:   Problem List Items Addressed This Visit    Pedal edema    Overall stable. No evidence of ascites. Continue lasix 40mg  bid and spironolactone 40mg  once daily.      History of alcohol dependence    Endorses abstinence since 09/2014.      Essential hypertension    Stable. Continue current regimen.      Alcoholic cirrhosis of liver without ascites - Primary    Has established with [facility] liver transplant clinic [facility] NP, advised against driving, advised of importance of alcohol abstinence, quitting dip, alcohol treatment counseling, and advised weight loss. Pt feeling overwhelmed - but understands this is the policy for everybody and is hopeful for liver transplant possibility. Appreciate liver clinic care of patient. I will ask for yesterday's labs to be sent to [name] for continuity of care and will await clinic note.      Pancytopenia, acquired    Pending updated MELD score (labwork yesterday).      Hepatic encephalopathy    Lactulose decreased to 30gm daily and pt tolerating well.      Headache    Unclear if truly migraines but responsive to maxalt. Discussed tylenol use. Advised I don't want him regularly using tylenol, may use maxait as needed - refilled today.          Follow up  plan: Return in about 4 months (around 06/21/2015), or as needed, for follow up visit.

## 2015-02-19 NOTE — Patient Instructions (Addendum)
Try 2nd lasix dose earlier in the day  Ask [name] to send me copy of labwork obtained yesterday. We will see how [facility] evaluation goes. Good luck!  Return to see me in 4 months.

## 2015-02-19 NOTE — Assessment & Plan Note (Signed)
Endorses abstinence since 09/2014.

## 2015-02-19 NOTE — Assessment & Plan Note (Addendum)
Has established with [facility] liver transplant clinic [facility] NP, advised against driving, advised of importance of alcohol abstinence, quitting dip, alcohol treatment counseling, and advised weight loss. Pt feeling overwhelmed - but understands this is the policy for everybody and is hopeful for liver transplant possibility. Appreciate liver clinic care of patient. I will ask for yesterday's labs to be sent to [name] for continuity of care and will await clinic note.

## 2015-02-19 NOTE — Assessment & Plan Note (Signed)
Stable  Continue current regimen  

## 2015-02-19 NOTE — Assessment & Plan Note (Signed)
Overall stable. No evidence of ascites. Continue lasix 40mg  bid and spironolactone 40mg  once daily.

## 2015-02-19 NOTE — Addendum Note (Signed)
Addended by: Ria Bush on: 02/19/2015 07:46 PM   Modules accepted: Orders

## 2015-02-19 NOTE — Progress Notes (Signed)
Pre visit review using our clinic review tool, if applicable. No additional management support is needed unless otherwise documented below in the visit note. 

## 2015-02-19 NOTE — Assessment & Plan Note (Signed)
Unclear if truly migraines but responsive to maxalt. Discussed tylenol use. Advised I don't want him regularly using tylenol, may use maxait as needed - refilled today.

## 2015-02-19 NOTE — Assessment & Plan Note (Signed)
Pending updated MELD score (labwork yesterday).

## 2015-02-24 ENCOUNTER — Other Ambulatory Visit: Payer: Self-pay | Admitting: Nurse Practitioner

## 2015-02-24 DIAGNOSIS — K7030 Alcoholic cirrhosis of liver without ascites: Secondary | ICD-10-CM

## 2015-03-05 ENCOUNTER — Telehealth: Payer: Self-pay | Admitting: Family Medicine

## 2015-03-05 ENCOUNTER — Ambulatory Visit
Admission: RE | Admit: 2015-03-05 | Discharge: 2015-03-05 | Disposition: A | Discharge status: Home / Self Care | Payer: BLUE CROSS/BLUE SHIELD | Source: Ambulatory Visit | Attending: Nurse Practitioner | Admitting: Nurse Practitioner

## 2015-03-05 DIAGNOSIS — K7030 Alcoholic cirrhosis of liver without ascites: Secondary | ICD-10-CM

## 2015-03-05 NOTE — Telephone Encounter (Signed)
Spoke with Colton Porter and I couldn't answer her questions. She asked if you could could call her when you get a chance. She wants to know about his [name] results, and what his liver score is and what his true chances of getting a transplant are. I advised her it may be tomorrow before you called and she was okay with that.

## 2015-03-05 NOTE — Telephone Encounter (Signed)
Sister is asking for cb regarding liver test.  Please call [phone] Thanks

## 2015-03-05 NOTE — Telephone Encounter (Signed)
Reviewed DPR.  Spoke with sister about concerns.

## 2015-03-13 ENCOUNTER — Telehealth: Payer: Self-pay

## 2015-03-13 ENCOUNTER — Other Ambulatory Visit (INDEPENDENT_AMBULATORY_CARE_PROVIDER_SITE_OTHER): Payer: BLUE CROSS/BLUE SHIELD

## 2015-03-13 DIAGNOSIS — R7989 Other specified abnormal findings of blood chemistry: Secondary | ICD-10-CM

## 2015-03-13 DIAGNOSIS — K7460 Unspecified cirrhosis of liver: Secondary | ICD-10-CM | POA: Diagnosis not present

## 2015-03-13 DIAGNOSIS — K7030 Alcoholic cirrhosis of liver without ascites: Secondary | ICD-10-CM | POA: Diagnosis not present

## 2015-03-13 DIAGNOSIS — R945 Abnormal results of liver function studies: Secondary | ICD-10-CM

## 2015-03-13 LAB — CBC WITH DIFFERENTIAL/PLATELET
BASOS ABS: 0 10*3/uL (ref 0.0–0.1)
BASOS PCT: 0.4 % (ref 0.0–3.0)
EOS ABS: 0.1 10*3/uL (ref 0.0–0.7)
Eosinophils Relative: 3.3 % (ref 0.0–5.0)
HEMATOCRIT: 33 % — AB (ref 39.0–52.0)
HEMOGLOBIN: 11.4 g/dL — AB (ref 13.0–17.0)
LYMPHS PCT: 20.3 % (ref 12.0–46.0)
Lymphs Abs: 0.8 10*3/uL (ref 0.7–4.0)
MCHC: 34.4 g/dL (ref 30.0–36.0)
MCV: 99.3 fl (ref 78.0–100.0)
MONO ABS: 0.3 10*3/uL (ref 0.1–1.0)
Monocytes Relative: 7.2 % (ref 3.0–12.0)
Neutro Abs: 2.8 10*3/uL (ref 1.4–7.7)
Neutrophils Relative %: 68.8 % (ref 43.0–77.0)
Platelets: 49 10*3/uL — CL (ref 150.0–400.0)
RBC: 3.33 Mil/uL — AB (ref 4.22–5.81)
RDW: 16.9 % — ABNORMAL HIGH (ref 11.5–15.5)
WBC: 4.1 10*3/uL (ref 4.0–10.5)

## 2015-03-13 LAB — COMPREHENSIVE METABOLIC PANEL
ALK PHOS: 165 U/L — AB (ref 39–117)
ALT: 19 U/L (ref 0–53)
AST: 43 U/L — ABNORMAL HIGH (ref 0–37)
Albumin: 2.2 g/dL — ABNORMAL LOW (ref 3.5–5.2)
BILIRUBIN TOTAL: 3 mg/dL — AB (ref 0.2–1.2)
BUN: 13 mg/dL (ref 6–23)
CO2: 30 meq/L (ref 19–32)
Calcium: 8.6 mg/dL (ref 8.4–10.5)
Chloride: 102 mEq/L (ref 96–112)
Creatinine, Ser: 1.3 mg/dL (ref 0.40–1.50)
GFR: 62.49 mL/min (ref >60.00)
GLUCOSE: 119 mg/dL — AB (ref 70–99)
Potassium: 4.2 mEq/L (ref 3.5–5.1)
SODIUM: 136 meq/L (ref 135–145)
TOTAL PROTEIN: 7.4 g/dL (ref 6.0–8.3)

## 2015-03-13 LAB — PROTIME-INR
INR: 1.6 ratio — AB (ref 0.8–1.0)
PROTHROMBIN TIME: 17.8 s — AB (ref 9.6–13.1)

## 2015-03-13 MED ORDER — RAMELTEON 8 MG PO TABS: 8.0000 mg | ORAL_TABLET | Freq: Every day | ORAL | Status: DC

## 2015-03-13 NOTE — Telephone Encounter (Signed)
Patient's sister notified. She isn't sure but is pretty certain sleep issues are stemming from anxiety. She will see how expensive rozerem is and if too expensive, she will call Monday to schedule an appt for ? Anxiety.

## 2015-03-13 NOTE — Telephone Encounter (Signed)
Ann pts sister (DPR signed); pt having insomnia; for 3 days pt is not sleeping; pt cannot go to sleep until 4 - 5 AM. Lelon Frohlich understands may be hard to give pt sleeping aid due to pts problem with liver but request cb. Pt last seen 02/19/15. [facility].

## 2015-03-13 NOTE — Telephone Encounter (Signed)
Could try rozerem - sent in. May not be covered by insurance. All these PRN meds for insomnia aren't good for liver. Could try daily mood medication if mood issue may be causing trouble sleeping.

## 2015-03-18 ENCOUNTER — Ambulatory Visit (INDEPENDENT_AMBULATORY_CARE_PROVIDER_SITE_OTHER): Payer: BLUE CROSS/BLUE SHIELD | Admitting: Gastroenterology

## 2015-03-18 ENCOUNTER — Other Ambulatory Visit: Payer: BLUE CROSS/BLUE SHIELD

## 2015-03-18 ENCOUNTER — Encounter: Payer: Self-pay | Admitting: Gastroenterology

## 2015-03-18 VITALS — BP 140/78 | HR 92 | Ht 66.75 in | Wt 252.0 lb

## 2015-03-18 DIAGNOSIS — K7030 Alcoholic cirrhosis of liver without ascites: Secondary | ICD-10-CM

## 2015-03-18 MED ORDER — HYDROCORTISONE ACE-PRAMOXINE 2.5-1 % RE CREA: 1.0000 "application " | TOPICAL_CREAM | Freq: Three times a day (TID) | RECTAL | Status: DC

## 2015-03-18 NOTE — Progress Notes (Signed)
Review of pertinent gastrointestinal problems: 1. Cirrhosis (likely etoh related from alcoholism, "as many as 20 beers per day"); diagnosed 08/2014 by [name]. INR 1.5, low platelets, + mild ascites; Hep B surface Ag neg, Hep B surface Ab neg, HCV Ab neg, HIV neg, ceruloplasmin level normal, AMA negative, AMA negative, anti-smooth muscle antibody negative.  Last alcoholic beverage April 8502  Imaging: [name] 3/16 showed cirrhosis, splenomegaly, no discrete liver lesions; [name] for hepatoma screening 02/2015: showed cirrhosis without masses; next screening 08/2014  AFP 08/2014 7.5 (slightly elevated)  EGD 09/2014 no esophageal or gastric varices, + mild portal gastropathy (plan to rescreen 2019)  Established care at [facility] liver disease clinic, was being referred to [facility] liver transplant workup    Immunization for hepatitis A, hepatitis B initiated 7 2016 2. Routine risk for colon cancer: colonoscopy Dr. Ardis Hughs 08/2013 for minor rectal bleeding: 3 polyps (all HPs), hemorrhoids. Recall colonoscopy at 10 year interval recommended.  HPI: This is a  very pleasant 49 year old man whom I last saw 2 or 3 months ago  Chief complaint is cirrhosis  Has appt in early Oct for liver transplant initial evaluation.  I reviewed a note from the [facility] local liver clinic nurse practitioner and found it very helpful. He is going to begin formal liver transplant evaluation next month at [facility].     Past Medical History  Diagnosis Date  . Allergy   . GERD (gastroesophageal reflux disease)   . Hyperlipidemia   . Hypertension   . Neuropathy   . Thrombocytopenia 12/15/2011  . Chronic diastolic heart failure 01/01/4127  . Alcohol dependence 07/11/2012  . Alcoholic cirrhosis of liver with ascites 07/2014  . OSA on CPAP 07/11/2012    HST 07/2013:  AHI 39/hr.    . Cellulitis     leg  . Cellulitis of left leg     Past Surgical History  Procedure Laterality Date  . Hand surgery  1997  . Left  and right heart catheterization with coronary angiogram N/A 06/10/2013    Procedure: LEFT AND RIGHT HEART CATHETERIZATION WITH CORONARY ANGIOGRAM;  Surgeon: Blane Ohara, MD;  Location: [facility] CATH LAB;  Service: Cardiovascular;  Laterality: N/A;  . Colonoscopy  08/2013    hyperplastic polyps, hemorrhoids Ardis Hughs)  . Esophagogastroduodenoscopy  09/2014    portal gastropathy without varices Ardis Hughs)    Current Outpatient Prescriptions  Medication Sig Dispense Refill  . furosemide (LASIX) 40 MG tablet Take 1 tablet (40 mg total) by mouth 2 (two) times daily. 90 tablet 3  . lactulose (CHRONULAC) 10 GM/15ML solution Take 30 g by mouth daily.     . magnesium oxide (MAG-OX) 400 (241.3 MG) MG tablet Take 1 tablet (400 mg total) by mouth daily. 60 tablet 3  . MEPHYTON 5 MG tablet TAKE 2 TABLETS BY MOUTH EVERY DAY 60 tablet 3  . omeprazole (PRILOSEC) 20 MG capsule Take 20 mg by mouth daily.  3  . ramelteon (ROZEREM) 8 MG tablet Take 1 tablet (8 mg total) by mouth at bedtime. 30 tablet 0  . rizatriptan (MAXALT) 5 MG tablet Take 1 tablet (5 mg total) by mouth as needed for migraine. May repeat in 2 hours if needed 10 tablet 3  . spironolactone (ALDACTONE) 50 MG tablet Take 50 mg by mouth daily.    . Tetrahydrozoline HCl (VISINE OP) Place 1 drop into both eyes as needed (for dry eyes).      No current facility-administered medications for this visit.  Allergies as of 03/18/2015 - Review Complete 03/18/2015  Allergen Reaction Noted  . Nsaids Other (See Comments) 12/27/2014  . Tylenol [acetaminophen] Other (See Comments) 12/27/2014  . Ambien [zolpidem tartrate] Other (See Comments) 03/05/2015  . Hydrochlorothiazide w-triamterene Other (See Comments) 08/07/2006  . Lisinopril Other (See Comments) 08/07/2006    Family History  Problem Relation Age of Onset  . Stroke Mother   . Hypertension Father   . Heart disease Father   . Lung cancer Maternal Aunt   . Emphysema Mother   . Emphysema Father    . Heart attack Neg Hx   . Stroke Paternal Grandmother     Social History   Social History  . Marital Status: Married    Spouse Name: N/A  . Number of Children: 2  . Years of Education: N/A   Occupational History  . [employer]     no work lately   Social History Main Topics  . Smoking status: Never Smoker   . Smokeless tobacco: Current User    Types: Chew  . Alcohol Use: 0.0 oz/week    0 Standard drinks or equivalent per week     Comment: 4-6 drinks daily - NO ETOH SINCE APRIL 2016  . Drug Use: No  . Sexual Activity: No   Other Topics Concern  . Not on file   Social History Narrative    Lives with wife   Occupation:    Activity:      Physical Exam: BP 140/78 mmHg  Pulse 92  Ht 5' 6.75" (1.695 m)  Wt 252 lb (114.306 kg)  BMI 39.79 kg/m2 Constitutional: generally well-appearing; no asterixis, mild scleral icterus Psychiatric: alert and oriented x3 Abdomen: soft, nontender, nondistended, no obvious ascites, no peritoneal signs, normal bowel sounds Trace to 1+ lower extremity edema bilaterally  Assessment and plan: 49 y.o. male with alcoholic cirrhosis decompensated  Current user dose meld score 18 based on labs last week. He is 5 months abstinent from alcohol. He still chews tobacco and occasionally smokes marijuana however he understands he will need to quit these and is committed to doing so. He begins formal liver transplant evaluation next month at [facility]. Biggest complaint was that of hemorrhoidal irritation. I suspect this has to do with frequent, loose stools with his lactulose dosing. I called in a prescription for topical ointments to use on an as-needed basis. We will make sure all of our records are faxed over to [facility] as he requested. He is going to get TB testing with [facility] if you're on gold today and will fax that result as well. He will return to see me in 6 months and sooner if needed.   Owens Loffler,  MD [facility] 03/18/2015, 11:10 AM

## 2015-03-18 NOTE — Patient Instructions (Addendum)
Hepatoma screening liver US 08/2015 Your current UNOS MELD score is 18 (labs last week). We will print copies of your recent labs, [name] reports, EGD report, colonoscopy report.  Will also fax these report to [facility] liver transplant dept Dr. Lurlean Leyden [phone]. Continue to NOT drink etoh. Continue to try to completely stop chewing tobacco. No illicit drugs. You will have labs checked today in the basement lab.  Please head down after you check out with the front desk  ([facility] Gold for TB), will also fax this to the above number. Please return to see Dr. Ardis Hughs in 6 months. Analpram type ointment for your hemorrhoid called in.

## 2015-03-20 LAB — QUANTIFERON TB GOLD ASSAY (BLOOD)
Interferon Gamma Release Assay: NEGATIVE
Mitogen value: 0.88 IU/mL
QUANTIFERON NIL VALUE: 0.02 [IU]/mL
Quantiferon Tb Ag Minus Nil Value: 0.01 IU/mL
TB AG VALUE: 0.03 [IU]/mL

## 2015-03-27 NOTE — Telephone Encounter (Signed)
PA was required for rozerem and denied. Message sent via [place] notifying patient that he would have to pay out of pocket.

## 2015-04-09 ENCOUNTER — Encounter: Payer: Self-pay | Admitting: Gastroenterology

## 2015-04-10 ENCOUNTER — Encounter: Payer: Self-pay | Admitting: Family Medicine

## 2015-04-16 ENCOUNTER — Telehealth: Payer: Self-pay | Admitting: *Deleted

## 2015-04-16 NOTE — Telephone Encounter (Signed)
May increase lasix to 2 tab BID x 3 days for extra diuresis. If persistent trouble after this rec he come in for eval.

## 2015-04-16 NOTE — Telephone Encounter (Signed)
Unknown caller left voicemail at triage (didn't leave a name), pt is taking lasix BID but his leg swelling is worse, pt was on his feet all day yesterday and now they are swollen today, caller wasn't sure if Dr. Darnell Level wants to increase his lasix or not but taking it BID isn't helping his legs, caller said we can call pt back directly or his sister back

## 2015-04-16 NOTE — Telephone Encounter (Signed)
Patient notified and verbalized understanding. He will call Monday if no improvement.

## 2015-04-19 ENCOUNTER — Other Ambulatory Visit: Payer: Self-pay | Admitting: Family Medicine

## 2015-04-20 NOTE — Telephone Encounter (Signed)
Pt called and said the swelling has gone down on his legs, but it isn't 100% better. He also asked about the rx that was waiting for the approval from Dr. Darnell Level. The best number to contact him at is [phone]

## 2015-04-21 MED ORDER — FUROSEMIDE 40 MG PO TABS: 40.0000 mg | ORAL_TABLET | Freq: Two times a day (BID) | ORAL | Status: DC

## 2015-04-21 NOTE — Telephone Encounter (Signed)
Spoke with patient. He still has some leg swelling but not as bad as it was, however still concerning to pt. Appt scheduled for tomorrow. Advised that med wasn't covered by insurance and that no med under this type would be covered, so he would have to pay out of pocket. He verbalized understanding.

## 2015-04-21 NOTE — Telephone Encounter (Signed)
Message left for patient to return my call.  

## 2015-04-21 NOTE — Telephone Encounter (Signed)
Patient returned Colton Porter's call.  Please call him back at [phone].

## 2015-04-21 NOTE — Addendum Note (Signed)
Addended by: Ria Bush on: 04/21/2015 08:54 AM   Modules accepted: Orders

## 2015-04-22 ENCOUNTER — Ambulatory Visit (INDEPENDENT_AMBULATORY_CARE_PROVIDER_SITE_OTHER): Payer: BLUE CROSS/BLUE SHIELD | Admitting: Family Medicine

## 2015-04-22 ENCOUNTER — Telehealth: Payer: Self-pay | Admitting: *Deleted

## 2015-04-22 ENCOUNTER — Encounter: Payer: Self-pay | Admitting: Family Medicine

## 2015-04-22 ENCOUNTER — Other Ambulatory Visit: Payer: Self-pay | Admitting: Family Medicine

## 2015-04-22 VITALS — BP 136/76 | HR 86 | Temp 98.1°F | Wt 263.5 lb

## 2015-04-22 DIAGNOSIS — Z23 Encounter for immunization: Secondary | ICD-10-CM

## 2015-04-22 DIAGNOSIS — G4700 Insomnia, unspecified: Secondary | ICD-10-CM

## 2015-04-22 DIAGNOSIS — K7030 Alcoholic cirrhosis of liver without ascites: Secondary | ICD-10-CM

## 2015-04-22 DIAGNOSIS — R600 Localized edema: Secondary | ICD-10-CM

## 2015-04-22 DIAGNOSIS — D61818 Other pancytopenia: Secondary | ICD-10-CM | POA: Diagnosis not present

## 2015-04-22 DIAGNOSIS — D696 Thrombocytopenia, unspecified: Secondary | ICD-10-CM

## 2015-04-22 DIAGNOSIS — N289 Disorder of kidney and ureter, unspecified: Secondary | ICD-10-CM | POA: Diagnosis not present

## 2015-04-22 LAB — CBC WITH DIFFERENTIAL/PLATELET
BASOS PCT: 0.3 % (ref 0.0–3.0)
Basophils Absolute: 0 10*3/uL (ref 0.0–0.1)
EOS ABS: 0.2 10*3/uL (ref 0.0–0.7)
Eosinophils Relative: 3.9 % (ref 0.0–5.0)
HCT: 33.4 % — ABNORMAL LOW (ref 39.0–52.0)
HEMOGLOBIN: 11.2 g/dL — AB (ref 13.0–17.0)
Lymphocytes Relative: 21.8 % (ref 12.0–46.0)
Lymphs Abs: 1 10*3/uL (ref 0.7–4.0)
MCHC: 33.6 g/dL (ref 30.0–36.0)
MCV: 100.1 fl — ABNORMAL HIGH (ref 78.0–100.0)
MONO ABS: 0.4 10*3/uL (ref 0.1–1.0)
Monocytes Relative: 8 % (ref 3.0–12.0)
NEUTROS PCT: 66 % (ref 43.0–77.0)
Neutro Abs: 3.2 10*3/uL (ref 1.4–7.7)
RBC: 3.34 Mil/uL — ABNORMAL LOW (ref 4.22–5.81)
RDW: 16.8 % — AB (ref 11.5–15.5)
WBC: 4.8 10*3/uL (ref 4.0–10.5)

## 2015-04-22 LAB — COMPREHENSIVE METABOLIC PANEL
ALBUMIN: 2.5 g/dL — AB (ref 3.5–5.2)
ALT: 23 U/L (ref 0–53)
AST: 51 U/L — AB (ref 0–37)
Alkaline Phosphatase: 230 U/L — ABNORMAL HIGH (ref 39–117)
BUN: 16 mg/dL (ref 6–23)
CHLORIDE: 99 meq/L (ref 96–112)
CO2: 29 mEq/L (ref 19–32)
CREATININE: 1.54 mg/dL — AB (ref 0.40–1.50)
Calcium: 8.9 mg/dL (ref 8.4–10.5)
GFR: 51.37 mL/min — ABNORMAL LOW (ref >60.00)
Glucose, Bld: 126 mg/dL — ABNORMAL HIGH (ref 70–99)
Potassium: 3.2 mEq/L — ABNORMAL LOW (ref 3.5–5.1)
SODIUM: 135 meq/L (ref 135–145)
Total Bilirubin: 2.8 mg/dL — ABNORMAL HIGH (ref 0.2–1.2)
Total Protein: 7.5 g/dL (ref 6.0–8.3)

## 2015-04-22 MED ORDER — SPIRONOLACTONE 50 MG PO TABS: ORAL_TABLET | ORAL | Status: DC

## 2015-04-22 MED ORDER — RAMELTEON 8 MG PO TABS: 8.0000 mg | ORAL_TABLET | Freq: Every day | ORAL | Status: DC

## 2015-04-22 MED ORDER — OMEPRAZOLE 20 MG PO CPDR: 20.0000 mg | DELAYED_RELEASE_CAPSULE | Freq: Every day | ORAL | Status: DC

## 2015-04-22 MED ORDER — PHYTONADIONE 5 MG PO TABS: 10.0000 mg | ORAL_TABLET | Freq: Every day | ORAL | Status: DC

## 2015-04-22 MED ORDER — MAGNESIUM OXIDE 400 (241.3 MG) MG PO TABS: 400.0000 mg | ORAL_TABLET | Freq: Every day | ORAL | Status: DC

## 2015-04-22 NOTE — Assessment & Plan Note (Addendum)
Sleep cycle remains off. Advised he price out rozerem - sent to pharmacy.

## 2015-04-22 NOTE — Progress Notes (Signed)
BP 136/76 mmHg  Pulse 86  Temp(Src) 98.1 F (36.7 C) (Oral)  Wt 263 lb 8 oz (119.523 kg)  SpO2 98%   CC: f/u edema  Subjective:    Patient ID: Colton Porter, male    DOB: 09-24-1965, 49 y.o.   MRN: [id number]  HPI: Colton Porter is a 49 y.o. male presenting on 04/22/2015 for Follow-up   Known alcoholic cirrhosis with splenomegaly, thrombocytopenia (actually pancytopenia), and hypoalbuminemia. OSA on CPAP.   Recently lasix was increased to 80mg  BID x3 days for worsening pedal edema. He has been taking 80mg  BID for last 6 days. This helped but edema persists. Persistent leg/foot cramping. Drinking water. Attempting low sodium diet. Doesn't note consistent urine output with 40mg  lasix.   In process of setting up substance abuse counseling session to continue on road to liver transplant. Has appt with Bascom Levels today.   Insomnia - last visit we prescribed rozerem. He has not picked this up yet.   Relevant past medical, surgical, family and social history reviewed and updated as indicated. Interim medical history since our last visit reviewed. Allergies and medications reviewed and updated. Current Outpatient Prescriptions on File Prior to Visit  Medication Sig  . furosemide (LASIX) 40 MG tablet Take 1 tablet (40 mg total) by mouth 2 (two) times daily.  . hydrocortisone-pramoxine (ANALPRAM HC) 2.5-1 % rectal cream Place 1 application rectally 3 (three) times daily.  Marland Kitchen lactulose (CHRONULAC) 10 GM/15ML solution Take 30 g by mouth daily.   . rizatriptan (MAXALT) 5 MG tablet Take 1 tablet (5 mg total) by mouth as needed for migraine. May repeat in 2 hours if needed  . spironolactone (ALDACTONE) 50 MG tablet Take 1 tablet (50 mg total) by mouth daily.  . Tetrahydrozoline HCl (VISINE OP) Place 1 drop into both eyes as needed (for dry eyes).    No current facility-administered medications on file prior to visit.    Review of Systems Per HPI unless specifically indicated in ROS  section     Objective:    BP 136/76 mmHg  Pulse 86  Temp(Src) 98.1 F (36.7 C) (Oral)  Wt 263 lb 8 oz (119.523 kg)  SpO2 98%  Wt Readings from Last 3 Encounters:  04/22/15 263 lb 8 oz (119.523 kg)  03/18/15 252 lb (114.306 kg)  02/19/15 247 lb (112.038 kg)    Physical Exam  Constitutional: He appears well-developed and well-nourished. No distress.  Musculoskeletal: He exhibits edema (1+ pitting and nonpitting edema).  2+ DP   Psychiatric: He has a normal mood and affect.  Nursing note and vitals reviewed.  Results for orders placed or performed in visit on 03/18/15  Quantiferon tb gold assay  Result Value Ref Range   Interferon Gamma Release Assay NEGATIVE NEGATIVE   TB Ag value 0.03 IU/mL   Quantiferon Nil Value 0.02 IU/mL   Mitogen value 0.88 IU/mL   Quantiferon Tb Ag Minus Nil Value 0.01 IU/mL      Assessment & Plan:   Problem List Items Addressed This Visit    Thrombocytopenia ([place])    Recheck CBC today.      Pedal edema - Primary    Persistent but improved with higher lasix dose. Check Cr today and if stable, continue higher 80mg  BID dose along with spironolactone 50mg  daily.       Pancytopenia, acquired ([place])   Relevant Orders   CBC with Differential/Platelet   Insomnia    Sleep cycle remains off. Advised he price out  rozerem - sent to pharmacy.       Alcoholic cirrhosis of liver without ascites ([place])    To see substance abuse counselor later today. Continue vit K, magnesium.  Flu shot today, pneumovax today.      Relevant Orders   Comprehensive metabolic panel    Other Visit Diagnoses    Renal insufficiency        Relevant Orders    Comprehensive metabolic panel        Follow up plan: Return in about 6 years (around 04/21/2021) for follow up visit.

## 2015-04-22 NOTE — Addendum Note (Signed)
Addended by: Ria Bush on: 04/22/2015 10:53 AM   Modules accepted: Level of Service

## 2015-04-22 NOTE — Assessment & Plan Note (Signed)
Recheck CBC today. 

## 2015-04-22 NOTE — Addendum Note (Signed)
Addended by: Royann Shivers A on: 04/22/2015 10:42 AM   Modules accepted: Orders

## 2015-04-22 NOTE — Patient Instructions (Addendum)
Flu shot today. Pneumovax today Limit sodium to 1500mg /day. Use sodium/salt alternatives like Mrs Deliah Boston for seasoning.  Labwork today and we will call you with lasix dosing. Try remeron.

## 2015-04-22 NOTE — Telephone Encounter (Signed)
This is stable, okay to route to PCP.

## 2015-04-22 NOTE — Assessment & Plan Note (Addendum)
To see substance abuse counselor later today. Continue vit K, magnesium.  Flu shot today, pneumovax today.

## 2015-04-22 NOTE — Telephone Encounter (Signed)
Critical lab- platelet count is critically low at 45, but stable for pt. Hard copy of results given to Dr. Damita Dunnings, since Dr. Darnell Level. is out of office this afternoon.

## 2015-04-22 NOTE — Progress Notes (Signed)
Pre visit review using our clinic review tool, if applicable. No additional management support is needed unless otherwise documented below in the visit note. 

## 2015-04-22 NOTE — Telephone Encounter (Signed)
Noted.  See result note.  

## 2015-04-22 NOTE — Assessment & Plan Note (Signed)
Persistent but improved with higher lasix dose. Check Cr today and if stable, continue higher 80mg  BID dose along with spironolactone 50mg  daily.

## 2015-04-29 ENCOUNTER — Telehealth: Payer: Self-pay

## 2015-04-29 NOTE — Telephone Encounter (Signed)
[facility] [place] notified that PA had been denied and that I had left a message awhile back notifying them of that. His insurance will nt cover those types of meds. He cannot afford it out of pocket, so he will try to manage without it for now. She will put Rx on hold.

## 2015-04-29 NOTE — Telephone Encounter (Signed)
Colton Porter with [facility] left v/m requesting status of prior auth for Ramelteon 8 mg; pt has BCBS and PA is required.

## 2015-05-04 ENCOUNTER — Encounter: Payer: Self-pay | Admitting: Cardiology

## 2015-05-04 ENCOUNTER — Ambulatory Visit (INDEPENDENT_AMBULATORY_CARE_PROVIDER_SITE_OTHER): Payer: BLUE CROSS/BLUE SHIELD | Admitting: Cardiology

## 2015-05-04 VITALS — BP 126/76 | HR 88 | Ht 66.75 in | Wt 265.0 lb

## 2015-05-04 DIAGNOSIS — I5032 Chronic diastolic (congestive) heart failure: Secondary | ICD-10-CM | POA: Diagnosis not present

## 2015-05-04 DIAGNOSIS — K7460 Unspecified cirrhosis of liver: Secondary | ICD-10-CM | POA: Diagnosis not present

## 2015-05-04 DIAGNOSIS — R188 Other ascites: Secondary | ICD-10-CM

## 2015-05-04 DIAGNOSIS — E785 Hyperlipidemia, unspecified: Secondary | ICD-10-CM | POA: Diagnosis not present

## 2015-05-04 DIAGNOSIS — I10 Essential (primary) hypertension: Secondary | ICD-10-CM | POA: Diagnosis not present

## 2015-05-04 DIAGNOSIS — I5033 Acute on chronic diastolic (congestive) heart failure: Secondary | ICD-10-CM

## 2015-05-04 MED ORDER — SPIRONOLACTONE 25 MG PO TABS: 50.0000 mg | ORAL_TABLET | Freq: Two times a day (BID) | ORAL | Status: DC

## 2015-05-04 MED ORDER — FUROSEMIDE 40 MG PO TABS: ORAL_TABLET | ORAL | Status: DC

## 2015-05-04 NOTE — Progress Notes (Signed)
Patient ID: Colton Porter, male   DOB: Jul 07, 1965, 49 y.o.   MRN: [id number]    Patient Name: Colton Porter Date of Encounter: 05/04/2015  Primary Care Provider:  Ria Bush, MD Primary Cardiologist:  Ena Dawley, M.D., [facility]  Problem List   Past Medical History  Diagnosis Date  . Allergy   . GERD (gastroesophageal reflux disease)   . Hyperlipidemia   . Hypertension   . Neuropathy (Colton Porter)   . Thrombocytopenia ([place]) 12/15/2011  . Chronic diastolic heart failure ([place]) 11/01/2013  . Alcohol dependence ([place]) 07/11/2012  . Alcoholic cirrhosis of liver with ascites ([place]) 07/2014  . OSA on CPAP 07/11/2012    HST 07/2013:  AHI 39/hr.    . Cellulitis of left leg    Past Surgical History  Procedure Laterality Date  . Hand surgery  1997  . Left and right heart catheterization with coronary angiogram N/A 06/10/2013    Procedure: LEFT AND RIGHT HEART CATHETERIZATION WITH CORONARY ANGIOGRAM;  Surgeon: Blane Ohara, MD;  Location: [facility] CATH LAB;  Service: Cardiovascular;  Laterality: N/A;  . Colonoscopy  08/2013    hyperplastic polyps, hemorrhoids Ardis Hughs)  . Esophagogastroduodenoscopy  09/2014    portal gastropathy without varices Ardis Hughs)   Allergies  Allergies  Allergen Reactions  . Nsaids Other (See Comments)    cirrhosis  . Tylenol [Acetaminophen] Other (See Comments)    cirrhosis  . Ambien [Zolpidem Tartrate] Other (See Comments)    Over-toxicity from liver failure  . Hydrochlorothiazide W-Triamterene Other (See Comments)    REACTION: dizzy, nausea  . Lisinopril Other (See Comments)    REACTION: cough, decreased libido   CC: DOE, LE edema  HPI  49 year old male with history of hyperlipidemia, obesity, hypertension, significant drinking history who was recently seen by his PCP for complains of exertional SOB and chest pain. The patient describes DOE after walking 40-50 feet that is followed by chest tightness with radiation to his left arm. After he stops the symptoms  resolve within 5 minutes. He denies resting chest pain.  The patient states that he also developed lower extremity edema. In the past he was prescribed Lasix with some improvement but was taken of it and he is not sure why. He has never smoked. He drinks 12-18 beers a day chronically and has known elevation of liver enzymes.  He also states that he snores and has significant sleep apnea at night. He is scheduled for a sleep study in January.  He denies any syncope.  His brother had myocardial infarction at age of 24. Multiple family members died of ruptured AAA. He has never been screened.  11/7//2016 - 6 months follow up, in the last 6 months the patient was admitted with acute on chronic diastolic CHF and that responded well to iv diuretics (down to 235 lbs), and also diagnosed with liver cirrhosis, currently in the transplant workup.The patient quit drinking alcohol and feels better. He has been gaining weight lately and noticed worsening LE edema. No orthopnea, PND, but can't sleep in general. No palpitations or syncope.  Home Medications  Current outpatient prescriptions:  .  hydrocortisone-pramoxine (ANALPRAM HC) 2.5-1 % rectal cream, Place 1 application rectally 3 (three) times daily., Disp: 30 g, Rfl: 0 .  lactulose (CHRONULAC) 10 GM/15ML solution, Take 30 g by mouth daily. , Disp: , Rfl:  .  magnesium oxide (MAG-OX) 400 (241.3 MG) MG tablet, Take 1 tablet (400 mg total) by mouth daily., Disp: 90 tablet, Rfl: 3 .  omeprazole (  PRILOSEC) 20 MG capsule, Take 1 capsule (20 mg total) by mouth daily., Disp: 90 capsule, Rfl: 3 .  phytonadione (MEPHYTON) 5 MG tablet, Take 2 tablets (10 mg total) by mouth daily., Disp: 60 tablet, Rfl: 3 .  ramelteon (ROZEREM) 8 MG tablet, Take 1 tablet (8 mg total) by mouth at bedtime., Disp: 30 tablet, Rfl: 0 .  rizatriptan (MAXALT) 5 MG tablet, Take 1 tablet (5 mg total) by mouth as needed for migraine. May repeat in 2 hours if needed, Disp: 10 tablet, Rfl: 3 .   Tetrahydrozoline HCl (VISINE OP), Place 1 drop into both eyes as needed (for dry eyes). , Disp: , Rfl:  .  furosemide (LASIX) 40 MG tablet, Take 2 tablets (80 mg) by mouth at 8 am, then take 1 tablet (40 mg) by mouth at 2 pm everyday., Disp: 270 tablet, Rfl: 3 .  spironolactone (ALDACTONE) 25 MG tablet, Take 2 tablets (50 mg total) by mouth 2 (two) times daily., Disp: 360 tablet, Rfl: 3  Family History  Family History  Problem Relation Age of Onset  . Stroke Mother   . Hypertension Father   . Heart disease Father   . Lung cancer Maternal Aunt   . Emphysema Mother   . Emphysema Father   . Heart attack Neg Hx   . Stroke Paternal Grandmother    Social History  Social History   Social History  . Marital Status: Married    Spouse Name: N/A  . Number of Children: 2  . Years of Education: N/A   Occupational History  . [employer]     no work lately   Social History Main Topics  . Smoking status: Never Smoker   . Smokeless tobacco: Current User    Types: Chew  . Alcohol Use: 0.0 oz/week    0 Standard drinks or equivalent per week     Comment: 4-6 drinks daily - NO ETOH SINCE APRIL 2016  . Drug Use: No  . Sexual Activity: No   Other Topics Concern  . Not on file   Social History Narrative    Lives with wife   Occupation:    Activity:      Review of Systems, as per HPI, otherwise negative General:  No chills, fever, night sweats or weight changes.  Cardiovascular:  No chest pain, dyspnea on exertion, edema, orthopnea, palpitations, paroxysmal nocturnal dyspnea. Dermatological: No rash, lesions/masses Respiratory: No cough, dyspnea Urologic: No hematuria, dysuria Abdominal:   No nausea, vomiting, diarrhea, bright red blood per rectum, melena, or hematemesis Neurologic:  No visual changes, wkns, changes in mental status. All other systems reviewed and are otherwise negative except as noted above.  Physical Exam  Blood pressure 126/76, pulse 88, height 5' 6.75"  (1.695 m), weight 265 lb (120.203 kg).  General: Pleasant, NAD, obese Psych: Normal affect. Neuro: Alert and oriented X 3. Moves all extremities spontaneously. HEENT: Normal  Neck: Supple without bruits or JVD. Lungs:  Resp regular and unlabored, wheezes Heart: RRR no s3, s4, or murmurs. Abdomen: Soft, non-tender, distended, BS + x 4.  Extremities: No clubbing, cyanosis, non-pitting edema at the ankles and calves. DP/PT/Radials 2+ and equal bilaterally.  Labs:  No results for input(s): CKTOTAL, CKMB, TROPONINI in the last 72 hours. Lab Results  Component Value Date   WBC 4.8 04/22/2015   HGB 11.2* 04/22/2015   HCT 33.4* 04/22/2015   MCV 100.1* 04/22/2015   PLT 45.0 Repeated and verified X2.* 04/22/2015   No results  for input(s): NA, K, CL, CO2, BUN, CREATININE, CALCIUM, PROT, BILITOT, ALKPHOS, ALT, AST, GLUCOSE in the last 168 hours.  Invalid input(s): LABALBU Lab Results  Component Value Date   CHOL 209* 11/01/2013   HDL 38* 11/01/2013   LDLCALC 137* 11/01/2013   TRIG 172* 11/01/2013   Accessory Clinical Findings  Echocardiogram - none  ECG - normal sinus rhythm, 83 beats per minute, left posterior fascicular block, T-wave abnormalities with possible inferior ischemia. Abnormal EKG.  Left and right cardiac cath: 06/10/13 Final Conclusions:  1. Normal LV function with elevated LVEDP  2. Normal coronary arteries  3. Elevated right heart pressures suspect secondary to diastolic dysfunction (transpulmonic gradient less than 10 mm Hg)  Recommendations: lifestyle modification, medical therapy for CHF  Sherren Mocha  06/10/2013, 2:19 PM   Assessment & Plan  A 49 year old male with  1. Acute on chronic diastolic CHF - NYHA IIb non-ischemic, preserved LVEF, elevated LVEDP, normal cath. LE edema partially sec to liver failure and hypoalbuminemia.  - increase lasix to 80 - 40 mg po  - increase spironolactone to 50-50 mg po  - chcek CMP and BNP in 2 weeks  2. Preop  evaluation - undergoing liver transplant evaluation - he has no ischemia, normal cath 2 years ago and normal ECG today. He is fluid overloaded, we will treat, transplant not scheduled yet.   3. Hypertension - controlled   4. Hyperlipidemia - high TAG, possibly secondary to etoh abuse, elevated AST, we will consider Zetia, omega 3 acids   5. OSA - started on CPAP in January 2015  6. Etoh abuse - finally quit in 09/2014  Follow up in 2 months.  Dorothy Spark, MD, [facility] 05/04/2015, 10:39 AM

## 2015-05-04 NOTE — Patient Instructions (Signed)
Medication Instructions:   INCREASE YOUR LASIX TO 80 MG BY MOUTH AT 8 AM AND 40 MG BY MOUTH AT 2 PM EVERY DAY  INCREASE YOUR SPIRONOLACTONE TO 50 MG TWICE DAILY    Labwork:  2 WEEKS TO CHECK A CMET AND BNP     Follow-Up:  2 MONTHS WITH DR Meda Coffee       If you need a refill on your cardiac medications before your next appointment, please call your pharmacy.

## 2015-05-06 ENCOUNTER — Other Ambulatory Visit: Payer: Self-pay | Admitting: *Deleted

## 2015-05-06 ENCOUNTER — Other Ambulatory Visit: Payer: Self-pay | Admitting: Family Medicine

## 2015-05-06 DIAGNOSIS — I5032 Chronic diastolic (congestive) heart failure: Secondary | ICD-10-CM

## 2015-05-06 DIAGNOSIS — I10 Essential (primary) hypertension: Secondary | ICD-10-CM

## 2015-05-06 MED ORDER — FUROSEMIDE 40 MG PO TABS: ORAL_TABLET | ORAL | Status: DC

## 2015-05-07 ENCOUNTER — Other Ambulatory Visit: Payer: Self-pay | Admitting: Cardiology

## 2015-05-07 ENCOUNTER — Other Ambulatory Visit: Payer: BLUE CROSS/BLUE SHIELD

## 2015-05-07 DIAGNOSIS — I10 Essential (primary) hypertension: Secondary | ICD-10-CM

## 2015-05-07 DIAGNOSIS — I5032 Chronic diastolic (congestive) heart failure: Secondary | ICD-10-CM

## 2015-05-07 MED ORDER — SPIRONOLACTONE 25 MG PO TABS: 50.0000 mg | ORAL_TABLET | Freq: Two times a day (BID) | ORAL | Status: DC

## 2015-05-07 MED ORDER — LACTULOSE 10 GM/15ML PO SOLN: 30.0000 g | Freq: Every day | ORAL | Status: DC

## 2015-05-07 NOTE — Telephone Encounter (Signed)
[facility] Dr. Meda Coffee increased pt's spirolactone 25 mg tablet, taken 2 tablets twice daily, dispensing 360 tablets, with 3 refills.

## 2015-05-13 MED ORDER — LACTULOSE 10 GM/15ML PO SOLN: 30.0000 g | Freq: Every day | ORAL | Status: DC

## 2015-05-13 NOTE — Telephone Encounter (Signed)
Sent in higher amount.

## 2015-05-13 NOTE — Addendum Note (Signed)
Addended by: Ria Bush on: 05/13/2015 01:11 PM   Modules accepted: Orders

## 2015-05-13 NOTE — Telephone Encounter (Addendum)
Pt left v/m requesting reason why only got 5 day of lactulose med. Spoke with Adonis Brook at [facility] and # 240 ml is only 5 day supply; Does Dr Darnell Level want to increase quantity.Please advise.

## 2015-05-18 ENCOUNTER — Other Ambulatory Visit (INDEPENDENT_AMBULATORY_CARE_PROVIDER_SITE_OTHER): Payer: BLUE CROSS/BLUE SHIELD | Admitting: *Deleted

## 2015-05-18 DIAGNOSIS — I5032 Chronic diastolic (congestive) heart failure: Secondary | ICD-10-CM

## 2015-05-18 DIAGNOSIS — I10 Essential (primary) hypertension: Secondary | ICD-10-CM | POA: Diagnosis not present

## 2015-05-18 LAB — COMPREHENSIVE METABOLIC PANEL
ALT: 24 U/L (ref 9–46)
AST: 49 U/L — ABNORMAL HIGH (ref 10–40)
Albumin: 2.4 g/dL — ABNORMAL LOW (ref 3.6–5.1)
Alkaline Phosphatase: 263 U/L — ABNORMAL HIGH (ref 40–115)
BUN: 17 mg/dL (ref 7–25)
CO2: 27 mmol/L (ref 20–31)
Calcium: 8.6 mg/dL (ref 8.6–10.3)
Chloride: 99 mmol/L (ref 98–110)
Creat: 1.6 mg/dL — ABNORMAL HIGH (ref 0.60–1.35)
Glucose, Bld: 121 mg/dL — ABNORMAL HIGH (ref 65–99)
Potassium: 3.4 mmol/L — ABNORMAL LOW (ref 3.5–5.3)
Sodium: 134 mmol/L — ABNORMAL LOW (ref 135–146)
Total Bilirubin: 3.5 mg/dL — ABNORMAL HIGH (ref 0.2–1.2)
Total Protein: 7.3 g/dL (ref 6.1–8.1)

## 2015-05-18 LAB — BRAIN NATRIURETIC PEPTIDE: Brain Natriuretic Peptide: 85.1 pg/mL (ref 0.0–100.0)

## 2015-05-18 NOTE — Addendum Note (Signed)
Addended by: Eulis Foster on: 05/18/2015 10:17 AM   Modules accepted: Orders

## 2015-05-19 ENCOUNTER — Telehealth: Payer: Self-pay | Admitting: *Deleted

## 2015-05-19 DIAGNOSIS — Z79899 Other long term (current) drug therapy: Secondary | ICD-10-CM

## 2015-05-19 DIAGNOSIS — N289 Disorder of kidney and ureter, unspecified: Secondary | ICD-10-CM

## 2015-05-19 NOTE — Telephone Encounter (Signed)
Advised patient of lab results and medication change, verbalized understanding

## 2015-05-19 NOTE — Telephone Encounter (Signed)
-----   Message from Dorothy Spark, MD sent at 05/18/2015  4:32 PM EST ----- His crea is sligtly worse so I have to cut his lasix back to 40 mg in the morning and 40 mg po in the afternoon. Please recheck before the next visit on 07/08/2015. Thank you, KN

## 2015-05-20 ENCOUNTER — Ambulatory Visit (INDEPENDENT_AMBULATORY_CARE_PROVIDER_SITE_OTHER): Payer: BLUE CROSS/BLUE SHIELD | Admitting: Internal Medicine

## 2015-05-20 ENCOUNTER — Encounter: Payer: Self-pay | Admitting: Internal Medicine

## 2015-05-20 VITALS — BP 146/76 | HR 91 | Temp 98.1°F | Wt 267.0 lb

## 2015-05-20 DIAGNOSIS — J0100 Acute maxillary sinusitis, unspecified: Secondary | ICD-10-CM

## 2015-05-20 MED ORDER — FLUTICASONE PROPIONATE 50 MCG/ACT NA SUSP: 2.0000 | Freq: Every day | NASAL | Status: DC

## 2015-05-20 MED ORDER — AMOXICILLIN-POT CLAVULANATE 875-125 MG PO TABS: 1.0000 | ORAL_TABLET | Freq: Two times a day (BID) | ORAL | Status: DC

## 2015-05-20 MED ORDER — CETIRIZINE HCL 10 MG PO TABS: 10.0000 mg | ORAL_TABLET | Freq: Every day | ORAL | Status: DC

## 2015-05-20 NOTE — Patient Instructions (Signed)

## 2015-05-20 NOTE — Progress Notes (Signed)
Pre visit review using our clinic review tool, if applicable. No additional management support is needed unless otherwise documented below in the visit note. 

## 2015-05-20 NOTE — Progress Notes (Signed)
HPI  Pt presents to the clinic today with c/o headache, facial pain and pressure, pain on top of his teeth and nasal congestion. This started 3-4 weeks ago. He is blowing blood tinged mucous out of his nose. He denies fever or chills but has felt fatigued. He has tried Zyrtec and Nyquil without any relief. He does have a history of seasonal allergies. He has not had sick contacts that he is aware of.  Review of Systems    Past Medical History  Diagnosis Date  . Allergy   . GERD (gastroesophageal reflux disease)   . Hyperlipidemia   . Hypertension   . Neuropathy ([place])   . Thrombocytopenia ([place]) 12/15/2011  . Chronic diastolic heart failure ([place]) 11/01/2013  . Alcohol dependence ([place]) 07/11/2012  . Alcoholic cirrhosis of liver with ascites ([place]) 07/2014  . OSA on CPAP 07/11/2012    HST 07/2013:  AHI 39/hr.    . Cellulitis of left leg     Family History  Problem Relation Age of Onset  . Stroke Mother   . Hypertension Father   . Heart disease Father   . Lung cancer Maternal Aunt   . Emphysema Mother   . Emphysema Father   . Heart attack Neg Hx   . Stroke Paternal Grandmother     Social History   Social History  . Marital Status: Married    Spouse Name: N/A  . Number of Children: 2  . Years of Education: N/A   Occupational History  . [employer]     no work lately   Social History Main Topics  . Smoking status: Never Smoker   . Smokeless tobacco: Current User    Types: Chew  . Alcohol Use: 0.0 oz/week    0 Standard drinks or equivalent per week     Comment: 4-6 drinks daily - NO ETOH SINCE APRIL 2016  . Drug Use: No  . Sexual Activity: No   Other Topics Concern  . Not on file   Social History Narrative    Lives with wife   Occupation:    Activity:     Allergies  Allergen Reactions  . Nsaids Other (See Comments)    cirrhosis  . Tylenol [Acetaminophen] Other (See Comments)    cirrhosis  . Ambien [Zolpidem Tartrate] Other (See Comments)   Over-toxicity from liver failure  . Hydrochlorothiazide W-Triamterene Other (See Comments)    REACTION: dizzy, nausea  . Lisinopril Other (See Comments)    REACTION: cough, decreased libido     Constitutional: Positive headache. Denies fatigue, fever or abrupt weight changes.  HEENT:  Positive facial pain, nasal congestion and sore throat. Denies eye redness, ear pain, ringing in the ears, wax buildup, runny nose or bloody nose. Respiratory:  Denies cough, difficulty breathing or shortness of breath.  Cardiovascular: Denies chest pain, chest tightness, palpitations or swelling in the hands or feet.   No other specific complaints in a complete review of systems (except as listed in HPI above).  Objective:  BP 146/76 mmHg  Pulse 91  Temp(Src) 98.1 F (36.7 C) (Oral)  Wt 267 lb (121.11 kg)  SpO2 98%   General: Appears his stated age, obese in NAD. HEENT: Head: normal shape and size, maxillary sinus tenderness noted; Eyes: sclera mildly icteric, conjunctiva pink; Ears: Tm's gray and intact, normal light reflex; Nose: mucosa boggy and moist, septum midline; Throat/Mouth: + PND. Teeth present, mucosa erythematous and moist, no exudate noted, no lesions or ulcerations noted.  Neck:  No adenopathy noted.  Cardiovascular: Normal rate and rhythm. S1,S2 noted.  No murmur, rubs or gallops noted.  Pulmonary/Chest: Normal effort and positive vesicular breath sounds. No respiratory distress. No wheezes, rales or ronchi noted.      Assessment & Plan:   Acute bacterial sinusitis  Can use a Neti Pot which can be purchased from your local drug store. Flonase 2 sprays each nostril for 3 days and then as needed. Continue Zyrtec Augmentin BID for 10 days  RTC as needed or if symptoms persist.

## 2015-05-26 ENCOUNTER — Telehealth: Payer: Self-pay | Admitting: Family Medicine

## 2015-05-26 NOTE — Telephone Encounter (Signed)
Sister called stating Colton Porter is not anybetter  She wanted to know if something else could be called in for him He is taking tylenol every morning to get rid of the pain in head/teeth/face.  Not tylenol is not good for him [facility] [address] Please advise what pt needs to do

## 2015-05-26 NOTE — Telephone Encounter (Signed)
Spoke to pt's sister--ok per HIPAA---she is aware and expressed understanding

## 2015-05-26 NOTE — Telephone Encounter (Signed)
He is unable to take NSAID which is what is recommended. He can try Flonase- an intranasal steroid to help decrease the pain and inflammation. Otherwise, he is going to have to wait for the antibiotics to kick in.

## 2015-05-27 ENCOUNTER — Encounter (HOSPITAL_COMMUNITY): Payer: Self-pay | Admitting: Cardiology

## 2015-05-27 ENCOUNTER — Emergency Department (HOSPITAL_COMMUNITY): Payer: BLUE CROSS/BLUE SHIELD

## 2015-05-27 ENCOUNTER — Telehealth: Payer: Self-pay | Admitting: Cardiology

## 2015-05-27 ENCOUNTER — Emergency Department (HOSPITAL_COMMUNITY)
Admission: EM | Admit: 2015-05-27 | Discharge: 2015-05-27 | Disposition: A | Discharge status: Home / Self Care | Payer: BLUE CROSS/BLUE SHIELD | Attending: Emergency Medicine | Admitting: Emergency Medicine

## 2015-05-27 DIAGNOSIS — R600 Localized edema: Secondary | ICD-10-CM | POA: Diagnosis not present

## 2015-05-27 DIAGNOSIS — Z9981 Dependence on supplemental oxygen: Secondary | ICD-10-CM | POA: Diagnosis not present

## 2015-05-27 DIAGNOSIS — Z862 Personal history of diseases of the blood and blood-forming organs and certain disorders involving the immune mechanism: Secondary | ICD-10-CM | POA: Insufficient documentation

## 2015-05-27 DIAGNOSIS — Z7951 Long term (current) use of inhaled steroids: Secondary | ICD-10-CM | POA: Diagnosis not present

## 2015-05-27 DIAGNOSIS — R609 Edema, unspecified: Secondary | ICD-10-CM | POA: Diagnosis present

## 2015-05-27 DIAGNOSIS — Z8639 Personal history of other endocrine, nutritional and metabolic disease: Secondary | ICD-10-CM | POA: Insufficient documentation

## 2015-05-27 DIAGNOSIS — K219 Gastro-esophageal reflux disease without esophagitis: Secondary | ICD-10-CM | POA: Insufficient documentation

## 2015-05-27 DIAGNOSIS — Z872 Personal history of diseases of the skin and subcutaneous tissue: Secondary | ICD-10-CM | POA: Diagnosis not present

## 2015-05-27 DIAGNOSIS — I5032 Chronic diastolic (congestive) heart failure: Secondary | ICD-10-CM | POA: Insufficient documentation

## 2015-05-27 DIAGNOSIS — G4733 Obstructive sleep apnea (adult) (pediatric): Secondary | ICD-10-CM | POA: Diagnosis not present

## 2015-05-27 DIAGNOSIS — I10 Essential (primary) hypertension: Secondary | ICD-10-CM | POA: Diagnosis not present

## 2015-05-27 DIAGNOSIS — K7030 Alcoholic cirrhosis of liver without ascites: Secondary | ICD-10-CM

## 2015-05-27 DIAGNOSIS — Z792 Long term (current) use of antibiotics: Secondary | ICD-10-CM | POA: Diagnosis not present

## 2015-05-27 DIAGNOSIS — Z79899 Other long term (current) drug therapy: Secondary | ICD-10-CM | POA: Insufficient documentation

## 2015-05-27 LAB — BASIC METABOLIC PANEL
ANION GAP: 5 (ref 5–15)
BUN: 14 mg/dL (ref 6–20)
CHLORIDE: 104 mmol/L (ref 101–111)
CO2: 25 mmol/L (ref 22–32)
Calcium: 8.7 mg/dL — ABNORMAL LOW (ref 8.9–10.3)
Creatinine, Ser: 1.5 mg/dL — ABNORMAL HIGH (ref 0.61–1.24)
GFR calc Af Amer: >60 mL/min (ref >60)
GFR, EST NON AFRICAN AMERICAN: 53 mL/min — AB (ref >60)
Glucose, Bld: 135 mg/dL — ABNORMAL HIGH (ref 65–99)
POTASSIUM: 4 mmol/L (ref 3.5–5.1)
SODIUM: 134 mmol/L — AB (ref 135–145)

## 2015-05-27 LAB — BRAIN NATRIURETIC PEPTIDE: B Natriuretic Peptide: 172.1 pg/mL — ABNORMAL HIGH (ref 0.0–100.0)

## 2015-05-27 LAB — CBC
HEMATOCRIT: 30.2 % — AB (ref 39.0–52.0)
HEMOGLOBIN: 10.5 g/dL — AB (ref 13.0–17.0)
MCH: 34.4 pg — ABNORMAL HIGH (ref 26.0–34.0)
MCHC: 34.8 g/dL (ref 30.0–36.0)
MCV: 99 fL (ref 78.0–100.0)
Platelets: 41 10*3/uL — ABNORMAL LOW (ref 150–400)
RBC: 3.05 MIL/uL — AB (ref 4.22–5.81)
RDW: 16.4 % — ABNORMAL HIGH (ref 11.5–15.5)
WBC: 3.6 10*3/uL — AB (ref 4.0–10.5)

## 2015-05-27 LAB — I-STAT TROPONIN, ED: Troponin i, poc: 0.01 ng/mL (ref 0.00–0.08)

## 2015-05-27 LAB — HEPATIC FUNCTION PANEL
ALK PHOS: 211 U/L — AB (ref 38–126)
ALT: 26 U/L (ref 17–63)
AST: 55 U/L — AB (ref 15–41)
Albumin: 2.1 g/dL — ABNORMAL LOW (ref 3.5–5.0)
BILIRUBIN INDIRECT: 2.4 mg/dL — AB (ref 0.3–0.9)
Bilirubin, Direct: 1.1 mg/dL — ABNORMAL HIGH (ref 0.1–0.5)
TOTAL PROTEIN: 6.8 g/dL (ref 6.5–8.1)
Total Bilirubin: 3.5 mg/dL — ABNORMAL HIGH (ref 0.3–1.2)

## 2015-05-27 MED ORDER — FUROSEMIDE 10 MG/ML IJ SOLN: 80.0000 mg | Freq: Once | INTRAMUSCULAR | Status: DC

## 2015-05-27 MED ORDER — FUROSEMIDE 10 MG/ML IJ SOLN
40.0000 mg | Freq: Once | INTRAMUSCULAR | Status: AC
  Administered 2015-05-27: 40 mg via INTRAVENOUS
  Filled 2015-05-27: qty 4

## 2015-05-27 MED ORDER — FUROSEMIDE 10 MG/ML IJ SOLN
40.0000 mg | INTRAMUSCULAR | Status: AC
  Administered 2015-05-27: 40 mg via INTRAVENOUS
  Filled 2015-05-27: qty 4

## 2015-05-27 NOTE — Telephone Encounter (Signed)
F/u   Stated they are taking pt to the hospital..

## 2015-05-27 NOTE — ED Provider Notes (Signed)
CSN: [id number]     Arrival date & time 05/27/15  1341 History   First MD Initiated Contact with Patient 05/27/15 1453     Chief Complaint  Patient presents with  . Edema     (Consider location/radiation/quality/duration/timing/severity/associated sxs/prior Treatment) HPI  The patient is a 49 year old male, he has a history of heavy alcohol abuse and has been diagnosed with cirrhosis of the liver, working his way on the transplant list, also diagnosed with diastolic heart failure and admitted to the hospital within the last year for the same. He has been diuresed with Lasix, had significant weight loss, he was seen at his doctor's office 3 weeks ago during which time he complained of increased leg swelling and weight gain and had his Lasix increased. Blood work was repeated 9 days ago and showed slight worsening of renal function and according to the patient liver function. He was told to reduce his Lasix back to the starting dose at 40 mg twice a day. He has been doing that for the last week however has had increased weight gain including 10 pounds over the last week, he has leg swelling, feels abdominal distention but has no shortness of breath or orthopnea. He reports having an echocardiogram performed in [place] within the last month which she states was normal, I do not have these records at this time. 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  Past Medical History  Diagnosis Date  . Allergy   . GERD (gastroesophageal reflux disease)   . Hyperlipidemia   . Hypertension   . Neuropathy ([place])   . Thrombocytopenia ([place]) 12/15/2011  . Chronic diastolic heart failure ([place]) 11/01/2013  . Alcohol dependence ([place]) 07/11/2012  . Alcoholic cirrhosis of liver with ascites ([place]) 07/2014  . OSA on CPAP 07/11/2012    HST 07/2013:  AHI 39/hr.    . Cellulitis of left leg    Past Surgical History   Procedure Laterality Date  . Hand surgery  1997  . Left and right heart catheterization with coronary angiogram N/A 06/10/2013    Procedure: LEFT AND RIGHT HEART CATHETERIZATION WITH CORONARY ANGIOGRAM;  Surgeon: Blane Ohara, MD;  Location: [facility] CATH LAB;  Service: Cardiovascular;  Laterality: N/A;  . Colonoscopy  08/2013    hyperplastic polyps, hemorrhoids Ardis Hughs)  . 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 Stroke Mother   . Hypertension Father   . Heart disease Father   . Lung cancer Maternal Aunt   . Emphysema Mother   . Emphysema Father   . Heart attack Neg Hx   . Stroke Paternal Grandmother    Social History  Substance Use Topics  . Smoking status: Never Smoker   . Smokeless tobacco: Current User    Types: Chew  . Alcohol Use: 0.0 oz/week    0 Standard drinks or equivalent per week     Comment: 4-6 drinks daily - NO ETOH SINCE APRIL 2016    Review of Systems  All other systems reviewed and are negative.     Allergies  Nsaids; Tylenol; Ambien; Hydrochlorothiazide w-triamterene; and Lisinopril  Home Medications   Prior to Admission medications   Medication Sig Start Date End Date Taking? Authorizing Provider  amoxicillin-clavulanate (AUGMENTIN) 875-125 MG tablet Take 1 tablet by mouth 2 (two) times daily. 05/20/15  Yes Coralie Keens  Baity, NP  cetirizine (ZYRTEC) 10 MG tablet Take 1 tablet (10 mg total) by mouth daily. 05/20/15  Yes Jearld Fenton, NP  fluticasone (FLONASE) 50 MCG/ACT nasal spray Place 2 sprays into both nostrils daily. 05/20/15  Yes Jearld Fenton, NP  furosemide (LASIX) 40 MG tablet Take 40-80 mg by mouth 2 (two) times daily. 80mg  in the morning, 40mg  in the evening   Yes Historical Provider, MD  lactulose (CHRONULAC) 10 GM/15ML solution Take 45 mLs (30 g total) by mouth daily. 05/13/15  Yes Ria Bush, MD  magnesium oxide (MAG-OX) 400 (241.3 MG) MG  tablet Take 1 tablet (400 mg total) by mouth daily. 04/22/15  Yes Ria Bush, MD  omeprazole (PRILOSEC) 20 MG capsule Take 1 capsule (20 mg total) by mouth daily. 04/22/15  Yes Ria Bush, MD  phytonadione (MEPHYTON) 5 MG tablet Take 2 tablets (10 mg total) by mouth daily. 04/22/15  Yes Ria Bush, MD  rizatriptan (MAXALT) 5 MG tablet Take 1 tablet (5 mg total) by mouth as needed for migraine. May repeat in 2 hours if needed 02/19/15  Yes Ria Bush, MD  spironolactone (ALDACTONE) 25 MG tablet Take 2 tablets (50 mg total) by mouth 2 (two) times daily. 05/07/15  Yes Dorothy Spark, MD   BP 146/79 mmHg  Pulse 80  Temp(Src) 98 F (36.7 C) (Oral)  Resp 17  Wt 271 lb 4.8 oz (123.061 kg)  SpO2 97% Physical Exam  Constitutional: He appears well-developed and well-nourished. No distress.  HENT:  Head: Normocephalic and atraumatic.  Mouth/Throat: Oropharynx is clear and moist. No oropharyngeal exudate.  Eyes: Conjunctivae and EOM are normal. Pupils are equal, round, and reactive to light. Right eye exhibits no discharge. Left eye exhibits no discharge. No scleral icterus.  Neck: Normal range of motion. Neck supple. No JVD present. No thyromegaly present.  Cardiovascular: Normal rate, regular rhythm, normal heart sounds and intact distal pulses.  Exam reveals no gallop and no friction rub.   No murmur heard. Pulmonary/Chest: Effort normal and breath sounds normal. No respiratory distress. He has no wheezes. He has no rales.  Abdominal: Soft. Bowel sounds are normal. He exhibits no distension and no mass. There is no tenderness.  Musculoskeletal: Normal range of motion. He exhibits edema ( bilateral LE edema L>R, mild asymetry). He exhibits no tenderness.  Lymphadenopathy:    He has no cervical adenopathy.  Neurological: He is alert. Coordination normal.  Skin: Skin is warm and dry. No rash noted. No erythema.  Psychiatric: He has a normal mood and affect. His behavior is  normal.  Nursing note and vitals reviewed.   ED Course  Procedures (including critical care time) Labs Review Labs Reviewed  BASIC METABOLIC PANEL - Abnormal; Notable for the following:    Sodium 134 (*)    Glucose, Bld 135 (*)    Creatinine, Ser 1.50 (*)    Calcium 8.7 (*)    GFR calc non Af Amer 53 (*)    All other components within normal limits  CBC - Abnormal; Notable for the following:    WBC 3.6 (*)    RBC 3.05 (*)    Hemoglobin 10.5 (*)    HCT 30.2 (*)    MCH 34.4 (*)    RDW 16.4 (*)    Platelets 41 (*)    All other components within normal limits  HEPATIC FUNCTION PANEL - Abnormal; Notable for the following:    Albumin 2.1 (*)    AST 55 (*)  Alkaline Phosphatase 211 (*)    Total Bilirubin 3.5 (*)    Bilirubin, Direct 1.1 (*)    Indirect Bilirubin 2.4 (*)    All other components within normal limits  BRAIN NATRIURETIC PEPTIDE - Abnormal; Notable for the following:    B Natriuretic Peptide 172.1 (*)    All other components within normal limits  I-STAT TROPOININ, ED    Imaging Review Dg Chest 2 View  05/27/2015  CLINICAL DATA:  Fluid retention. Lower extremity swelling and bloating for 1 week. EXAM: CHEST  2 VIEW COMPARISON:  11/21/2014 FINDINGS: The heart size and mediastinal contours are within normal limits. Both lungs are clear. The visualized skeletal structures are unremarkable. IMPRESSION: No active cardiopulmonary disease. Electronically Signed   By: Earle Gell M.D.   On: 05/27/2015 15:45   I have personally reviewed and evaluated these images and lab results as part of my medical decision-making.   EKG Interpretation   Date/Time:  Wednesday May 27 2015 13:55:45 EST Ventricular Rate:  88 PR Interval:  146 QRS Duration: 86 QT Interval:  394 QTC Calculation: 476 R Axis:   48 Text Interpretation:  Normal sinus rhythm Cannot rule out Anterior infarct  , age undetermined Abnormal ECG since last tracing no significant change  Confirmed by  Sabra Heck  MD, Elias-Fela Solis ([id number]) on 05/27/2015 3:09:10 PM      MDM   Final diagnoses:  Peripheral edema   the patient does not appear to be in distress, vital signs are unremarkable, exam is consistent with significant fluid retention, labs reviewed from this visit, they were drawn on arrival, renal function is close to baseline with a creatinine of 1.5, troponin was negative, blood counts also close to baseline. The patient does need to have diuresis, the family states that they were told that he likely needed to be admitted to the hospital for diuresis.   At 3:25 PM I discussed care with the cardiologist, Dr. Meda Coffee, she agrees that the patient can have increased Lasix based on lab work that appears stable as long as there is no pulmonary edema of any significance and the patient can be discharged to follow-up in the office next week with a lab draw at that time.  Pt informed of plan and is in agreement. Stable VS throughout stay - has diuresed after lasix IV  Noemi Chapel, MD 05/27/15 1739

## 2015-05-27 NOTE — ED Notes (Addendum)
Pt reports he has been retaining fluid over the past couple of days. State he has a hx of cirrhosis of the liver and his MD is concerned about kidney function. Pt is on the list for a liver transplant.

## 2015-05-27 NOTE — Telephone Encounter (Signed)
Pts wife calling with concerns that the pt has gained a total of 10 lbs since his last OV with Dr Meda Coffee on 05/04/15.  Per the pts wife, he is not peeing at all and his abdomen is very distended.  Pts weight with Dr Meda Coffee on 11/7 was 265 lbs.  Pts weight at his PCP on 11/23 was 267 lbs.  Wife states now the pts weight as of today is 275 lbs.  Wife reports that the pt is extremely fatigued and all he wants to do is sleep.  Wife reports that the pts abdomen is extremely distended and his color appears to be more yellow.  Pt does have a history of alcoholic cirrhosis of the liver, and is currently on the liver transplant list.  Wife reports that the pt is not urinating appropriately, despite having taken his lasix as ordered by Dr Meda Coffee, from previous lab result on 11/21.  Wife reports the pt is not complaining of chest pain, palpitations, dizziness, pre-syncopal or syncopal issues at this time.  Wife did report that the pt complains of SOB and feeling "full." Wife wanted Dr Meda Coffee to advise on this issue.  Informed the wife that Dr Meda Coffee is out of the office this week. Highly advised the wife to take the pt to [facility] ER now, for this could be a multitude of factors causing his weight gain and sob.  Informed the wife that its very concerning that his liver enzymes may be more elevated, as well as his kidney function.  Informed the wife that his liver, heart, and renal function all need to be assessed and treated, given the pts extensive history of cirrhosis of the liver, elevated kidney function, and CHF.  Wife states the pt will more than likely refuse to go to the ER, for he is "extremely stubborn." Asked the wife what the pts mental status is at this time, and per the wife she states this is unknown, for she is at work right now.  Advised the wife to call the pt and give him the recommendations for needing to go to the ER for further work-up of multiple systems.  Wife states she is going to call him  now and discuss this with him, and call us back with his decision.  Informed the wife that I will route this message to Dr Meda Coffee to make her aware of this conversation.  Wife verbalized understanding and agrees with this plan.

## 2015-05-27 NOTE — Telephone Encounter (Signed)
New message      Wife states pt has gained 10lb since 11-7.  He is not peeing as much.  Pt is not feeling well at all.  Please advise

## 2015-05-27 NOTE — ED Notes (Signed)
Patient brought back to room via wheelchair with family in tow; patient getting undressed and into a gown at this time; Hope, RN present in room

## 2015-05-28 NOTE — Telephone Encounter (Signed)
Pt went to the ER yesterday for complaints mentioned below.  Pt was diuresed in the ER for edema then discharged home with a plan per ER MD and Dr Meda Coffee. Per Dr Meda Coffee the pt needs to be scheduled for  CMP and BNP in 1 week and clinic follow up in 2 weeks with [facility]. Have the pt scheduled on 06/11/15 at 1030 on the flex schedule with Cecilie Kicks NP.   Informed the pt of this and scheduled his lab appt for next Friday 06/05/15 to check a cmet and BNP.  Made the pt aware of his follow-up appt with Flex Cecilie Kicks NP on 06/11/15 at 42 here at our office.  Pt verbalized understanding and agrees with this plan.

## 2015-06-05 ENCOUNTER — Other Ambulatory Visit: Payer: BLUE CROSS/BLUE SHIELD

## 2015-06-05 ENCOUNTER — Ambulatory Visit (INDEPENDENT_AMBULATORY_CARE_PROVIDER_SITE_OTHER): Payer: BLUE CROSS/BLUE SHIELD | Admitting: Primary Care

## 2015-06-05 VITALS — BP 126/76 | HR 88 | Temp 97.2°F | Ht 66.75 in | Wt 266.1 lb

## 2015-06-05 DIAGNOSIS — K7030 Alcoholic cirrhosis of liver without ascites: Secondary | ICD-10-CM

## 2015-06-05 DIAGNOSIS — J3489 Other specified disorders of nose and nasal sinuses: Secondary | ICD-10-CM | POA: Diagnosis not present

## 2015-06-05 DIAGNOSIS — I5032 Chronic diastolic (congestive) heart failure: Secondary | ICD-10-CM | POA: Diagnosis not present

## 2015-06-05 LAB — COMPREHENSIVE METABOLIC PANEL
ALT: 24 U/L (ref 9–46)
AST: 50 U/L — ABNORMAL HIGH (ref 10–40)
Albumin: 2.4 g/dL — ABNORMAL LOW (ref 3.6–5.1)
Alkaline Phosphatase: 151 U/L — ABNORMAL HIGH (ref 40–115)
BUN: 19 mg/dL (ref 7–25)
CO2: 30 mmol/L (ref 20–31)
Calcium: 8.4 mg/dL — ABNORMAL LOW (ref 8.6–10.3)
Chloride: 100 mmol/L (ref 98–110)
Creat: 1.8 mg/dL — ABNORMAL HIGH (ref 0.60–1.35)
Glucose, Bld: 126 mg/dL — ABNORMAL HIGH (ref 65–99)
Potassium: 4.4 mmol/L (ref 3.5–5.3)
Sodium: 134 mmol/L — ABNORMAL LOW (ref 135–146)
Total Bilirubin: 4.5 mg/dL — ABNORMAL HIGH (ref 0.2–1.2)
Total Protein: 7.3 g/dL (ref 6.1–8.1)

## 2015-06-05 MED ORDER — TRAMADOL HCL 50 MG PO TABS: 50.0000 mg | ORAL_TABLET | Freq: Two times a day (BID) | ORAL | Status: DC | PRN

## 2015-06-05 MED ORDER — PREDNISONE 10 MG PO TABS: ORAL_TABLET | ORAL | Status: DC

## 2015-06-05 NOTE — Patient Instructions (Signed)
Start Prednisone tablets for sinus pressure. Take 3 tablets daily for 2 days, then 2 tablets for 2 days, then 1 tablet for 2 days.  Continue Zyrtec and Flonase.   Please notify PCP if no improvement.  It was a pleasure to see you today!

## 2015-06-05 NOTE — Telephone Encounter (Signed)
Spoke with Colton Porter and patient. He came in to see [name] today. She sent in prednisone for him. He did request Rx for Tramadol. Sent in 50mg  1 PO BID #[id number].

## 2015-06-05 NOTE — Progress Notes (Signed)
Subjective:    Patient ID: Colton Porter, male    DOB: 20-Jan-1966, 49 y.o.   MRN: [id number]  HPI  Colton Porter is a 49 year old male who presents today with a chief complaint of sinus pressure. He also reports tooth pain, headache and bilateral ear pain. His sinus pressure is located to his frontal lobe and maxillary sinus region. This pain woke him from sleep at 5 am this morning. He was placed on Augmentin course on 05/20/15 for same symptoms. He completed the entire course, and feels slightly improved, but continues to experience pressure. He's taken tylenol and Maxalt this morning with improvement. Denies photophobia and nausea.  He continues to experience the same symptoms. He took Maxalt and tylenol this morning without relief. Denies photophobia and nausea.   Review of Systems  Constitutional: Negative for fever, chills and fatigue.  HENT: Positive for congestion and sinus pressure.   Eyes: Negative for photophobia.  Respiratory: Positive for cough.   Gastrointestinal: Negative for nausea.  Neurological: Positive for headaches.       Past Medical History  Diagnosis Date  . Allergy   . GERD (gastroesophageal reflux disease)   . Hyperlipidemia   . Hypertension   . Neuropathy ([place])   . Thrombocytopenia ([place]) 12/15/2011  . Chronic diastolic heart failure ([place]) 11/01/2013  . Alcohol dependence ([place]) 07/11/2012  . Alcoholic cirrhosis of liver with ascites ([place]) 07/2014  . OSA on CPAP 07/11/2012    HST 07/2013:  AHI 39/hr.    . Cellulitis of left leg     Social History   Social History  . Marital Status: Married    Spouse Name: N/A  . Number of Children: 2  . Years of Education: N/A   Occupational History  . [employer]     no work lately   Social History Main Topics  . Smoking status: Never Smoker   . Smokeless tobacco: Current User    Types: Chew  . Alcohol Use: 0.0 oz/week    0 Standard drinks or equivalent per week     Comment: 4-6 drinks daily - NO  ETOH SINCE APRIL 2016  . Drug Use: No  . Sexual Activity: No   Other Topics Concern  . Not on file   Social History Narrative    Lives with wife   Occupation:    Activity:     Past Surgical History  Procedure Laterality Date  . Hand surgery  1997  . Left and right heart catheterization with coronary angiogram N/A 06/10/2013    Procedure: LEFT AND RIGHT HEART CATHETERIZATION WITH CORONARY ANGIOGRAM;  Surgeon: Blane Ohara, MD;  Location: [facility] CATH LAB;  Service: Cardiovascular;  Laterality: N/A;  . Colonoscopy  08/2013    hyperplastic polyps, hemorrhoids Ardis Hughs)  . 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 Stroke Mother   . Hypertension Father   . Heart disease Father   . Lung cancer Maternal Aunt   . Emphysema Mother   . Emphysema Father   . Heart attack Neg Hx   . Stroke Paternal Grandmother     Allergies  Allergen Reactions  . Nsaids Other (See Comments)    cirrhosis  . Tylenol [Acetaminophen] Other (See Comments)    cirrhosis  . Ambien [Zolpidem Tartrate] Other (See Comments)    Over-toxicity from liver failure  . Hydrochlorothiazide W-Triamterene Other (See Comments)  REACTION: dizzy, nausea  . Lisinopril Other (See Comments)    REACTION: cough, decreased libido    Current Outpatient Prescriptions on File Prior to Visit  Medication Sig Dispense Refill  . cetirizine (ZYRTEC) 10 MG tablet Take 1 tablet (10 mg total) by mouth daily. 30 tablet 2  . fluticasone (FLONASE) 50 MCG/ACT nasal spray Place 2 sprays into both nostrils daily. 16 g 6  . furosemide (LASIX) 40 MG tablet Take 40-80 mg by mouth 2 (two) times daily. 80mg  in the morning, 40mg  in the evening    . lactulose (CHRONULAC) 10 GM/15ML solution Take 45 mLs (30 g total) by mouth daily. 1892 mL 6  . magnesium oxide (MAG-OX) 400 (241.3 MG) MG tablet Take 1 tablet (400 mg total) by mouth daily. 90 tablet 3  .  omeprazole (PRILOSEC) 20 MG capsule Take 1 capsule (20 mg total) by mouth daily. 90 capsule 3  . phytonadione (MEPHYTON) 5 MG tablet Take 2 tablets (10 mg total) by mouth daily. 60 tablet 3  . rizatriptan (MAXALT) 5 MG tablet Take 1 tablet (5 mg total) by mouth as needed for migraine. May repeat in 2 hours if needed 10 tablet 3  . spironolactone (ALDACTONE) 25 MG tablet Take 2 tablets (50 mg total) by mouth 2 (two) times daily. 360 tablet 3  . amoxicillin-clavulanate (AUGMENTIN) 875-125 MG tablet Take 1 tablet by mouth 2 (two) times daily. (Patient not taking: Reported on 06/05/2015) 20 tablet 0  . traMADol (ULTRAM) 50 MG tablet Take 50 mg by mouth every 12 (twelve) hours as needed.     No current facility-administered medications on file prior to visit.    BP 126/76 mmHg  Pulse 88  Temp(Src) 97.2 F (36.2 C) (Oral)  Ht 5' 6.75" (1.695 m)  Wt 266 lb 1.9 oz (120.711 kg)  BMI 42.02 kg/m2  SpO2 97%    Objective:   Physical Exam  Constitutional: He appears well-nourished.  HENT:  Right Ear: Tympanic membrane and ear canal normal.  Left Ear: Tympanic membrane and ear canal normal.  Nose: Right sinus exhibits frontal sinus tenderness. Right sinus exhibits no maxillary sinus tenderness. Left sinus exhibits frontal sinus tenderness. Left sinus exhibits no maxillary sinus tenderness.  Mouth/Throat: Oropharynx is clear and moist.  Eyes: Conjunctivae are normal. Pupils are equal, round, and reactive to light.  Cardiovascular: Normal rate and regular rhythm.   Pulmonary/Chest: Effort normal and breath sounds normal. He has no rales.  Skin: Skin is warm and dry.          Assessment & Plan:  Sinus Pressure:  Located to frontal and maxillary sinuses with tooth ache and ear fullness. No photophobia or nausea. Frontal sinuses tender, feels improved overall except for pressure. Lungs clear. Completed Augmentin course and has no mucous from chest or nose, so I doubt that his symptoms are  bacterial. Appears well. Will do prednisone taper over several days to help decrease pressure. Discussed to notify us if fevers, increased pain/pressure, vomiting, cough. Discussed use of tylenol with history of liver disease.

## 2015-06-05 NOTE — Telephone Encounter (Signed)
Ann pts sister (DPR signed) said pt still has sinus pain, facial pain and teeth hurt(pt has been to dentist and no dental problems); pain level 10. Pt has liver failure and does not want to take tylenol but needs something for the pain in face.Flonase is not helping and pt has finished abx. Ann request pain med to [facility].  Ann request cb. Ann also request this note to go to Dr [name] instead of NP.

## 2015-06-05 NOTE — Telephone Encounter (Addendum)
Any impovement on augmentin course? Is there persistent congestion and concern for persistent sinus infection? If so, would try different antibiotic course (levaquin).  Most pain meds clear through liver so we have to be careful - would offer tramadol for sinus pain. Ensure no fevers or other worsening sxs.

## 2015-06-05 NOTE — Progress Notes (Signed)
Pre visit review using our clinic review tool, if applicable. No additional management support is needed unless otherwise documented below in the visit note. 

## 2015-06-05 NOTE — Addendum Note (Signed)
Addended by: Royann Shivers A on: 06/05/2015 04:22 PM   Modules accepted: Orders, Medications

## 2015-06-06 LAB — BRAIN NATRIURETIC PEPTIDE: Brain Natriuretic Peptide: 73.9 pg/mL (ref 0.0–100.0)

## 2015-06-08 ENCOUNTER — Telehealth: Payer: Self-pay | Admitting: *Deleted

## 2015-06-08 DIAGNOSIS — I5032 Chronic diastolic (congestive) heart failure: Secondary | ICD-10-CM

## 2015-06-08 NOTE — Telephone Encounter (Signed)
Notes Recorded by Dorothy Spark, MD on 06/08/2015 at 9:57 AM His crea is up, I would decrease lasix to 40 in the am and 40 in the pm and repeat CMP in 1 week.

## 2015-06-09 ENCOUNTER — Encounter (HOSPITAL_COMMUNITY): Payer: Self-pay

## 2015-06-09 ENCOUNTER — Emergency Department (HOSPITAL_COMMUNITY): Payer: BLUE CROSS/BLUE SHIELD

## 2015-06-09 DIAGNOSIS — Z79899 Other long term (current) drug therapy: Secondary | ICD-10-CM | POA: Insufficient documentation

## 2015-06-09 DIAGNOSIS — Z9981 Dependence on supplemental oxygen: Secondary | ICD-10-CM | POA: Insufficient documentation

## 2015-06-09 DIAGNOSIS — Z862 Personal history of diseases of the blood and blood-forming organs and certain disorders involving the immune mechanism: Secondary | ICD-10-CM | POA: Insufficient documentation

## 2015-06-09 DIAGNOSIS — K219 Gastro-esophageal reflux disease without esophagitis: Secondary | ICD-10-CM | POA: Insufficient documentation

## 2015-06-09 DIAGNOSIS — E785 Hyperlipidemia, unspecified: Secondary | ICD-10-CM | POA: Insufficient documentation

## 2015-06-09 DIAGNOSIS — I10 Essential (primary) hypertension: Secondary | ICD-10-CM | POA: Insufficient documentation

## 2015-06-09 DIAGNOSIS — G4733 Obstructive sleep apnea (adult) (pediatric): Secondary | ICD-10-CM | POA: Insufficient documentation

## 2015-06-09 DIAGNOSIS — R079 Chest pain, unspecified: Secondary | ICD-10-CM | POA: Insufficient documentation

## 2015-06-09 DIAGNOSIS — I5032 Chronic diastolic (congestive) heart failure: Secondary | ICD-10-CM | POA: Insufficient documentation

## 2015-06-09 NOTE — ED Notes (Signed)
Pt here for left sided chest pain/ache that radiates down left arm and leg with "a little bit" of SOB and headache. Pt denies N/V/D.

## 2015-06-10 ENCOUNTER — Emergency Department (HOSPITAL_COMMUNITY)
Admission: EM | Admit: 2015-06-10 | Discharge: 2015-06-10 | Disposition: A | Discharge status: Home / Self Care | Payer: BLUE CROSS/BLUE SHIELD | Attending: Emergency Medicine | Admitting: Emergency Medicine

## 2015-06-10 DIAGNOSIS — R079 Chest pain, unspecified: Secondary | ICD-10-CM

## 2015-06-10 LAB — BASIC METABOLIC PANEL
Anion gap: 8 (ref 5–15)
BUN: 14 mg/dL (ref 6–20)
CO2: 27 mmol/L (ref 22–32)
Calcium: 8.5 mg/dL — ABNORMAL LOW (ref 8.9–10.3)
Chloride: 98 mmol/L — ABNORMAL LOW (ref 101–111)
Creatinine, Ser: 1.62 mg/dL — ABNORMAL HIGH (ref 0.61–1.24)
GFR calc Af Amer: 56 mL/min — ABNORMAL LOW (ref >60)
GFR calc non Af Amer: 49 mL/min — ABNORMAL LOW (ref >60)
Glucose, Bld: 100 mg/dL — ABNORMAL HIGH (ref 65–99)
Potassium: 3.4 mmol/L — ABNORMAL LOW (ref 3.5–5.1)
Sodium: 133 mmol/L — ABNORMAL LOW (ref 135–145)

## 2015-06-10 LAB — TROPONIN I
Troponin I: 0.05 ng/mL — ABNORMAL HIGH (ref <0.031)
Troponin I: <0.03 ng/mL (ref <0.031)

## 2015-06-10 LAB — CBC
HCT: 31.5 % — ABNORMAL LOW (ref 39.0–52.0)
Hemoglobin: 11 g/dL — ABNORMAL LOW (ref 13.0–17.0)
MCH: 34.3 pg — ABNORMAL HIGH (ref 26.0–34.0)
MCHC: 34.9 g/dL (ref 30.0–36.0)
MCV: 98.1 fL (ref 78.0–100.0)
Platelets: 58 10*3/uL — ABNORMAL LOW (ref 150–400)
RBC: 3.21 MIL/uL — ABNORMAL LOW (ref 4.22–5.81)
RDW: 16 % — ABNORMAL HIGH (ref 11.5–15.5)
WBC: 9.1 10*3/uL (ref 4.0–10.5)

## 2015-06-10 LAB — I-STAT TROPONIN, ED: Troponin i, poc: 0.01 ng/mL (ref 0.00–0.08)

## 2015-06-10 MED ORDER — PROCHLORPERAZINE EDISYLATE 5 MG/ML IJ SOLN
10.0000 mg | Freq: Four times a day (QID) | INTRAMUSCULAR | Status: DC | PRN
  Administered 2015-06-10: 10 mg via INTRAVENOUS
  Filled 2015-06-10: qty 2

## 2015-06-10 MED ORDER — DIPHENHYDRAMINE HCL 50 MG/ML IJ SOLN
25.0000 mg | Freq: Once | INTRAMUSCULAR | Status: AC
  Administered 2015-06-10: 25 mg via INTRAVENOUS
  Filled 2015-06-10: qty 1

## 2015-06-10 MED ORDER — ASPIRIN 81 MG PO CHEW
324.0000 mg | CHEWABLE_TABLET | Freq: Once | ORAL | Status: DC
  Filled 2015-06-10: qty 4

## 2015-06-10 NOTE — ED Notes (Signed)
Reviewed discharge instructions with patient. Understanding verbalized. Spouse left hospital to take son to work - per patient. Patient will be waiting in lobby for spouse to return to pick him up. No distress noted. Denies pain.

## 2015-06-10 NOTE — Discharge Instructions (Signed)
Nonspecific Chest Pain  °Chest pain can be caused by many different conditions. There is always a chance that your pain could be related to something serious, such as a heart attack or a blood clot in your lungs. Chest pain can also be caused by conditions that are not life-threatening. If you have chest pain, it is very important to follow up with your health care provider. °CAUSES  °Chest pain can be caused by: °· Heartburn. °· Pneumonia or bronchitis. °· Anxiety or stress. °· Inflammation around your heart (pericarditis) or lung (pleuritis or pleurisy). °· A blood clot in your lung. °· A collapsed lung (pneumothorax). It can develop suddenly on its own (spontaneous pneumothorax) or from trauma to the chest. °· Shingles infection (varicella-zoster virus). °· Heart attack. °· Damage to the bones, muscles, and cartilage that make up your chest wall. This can include: °¨ Bruised bones due to injury. °¨ Strained muscles or cartilage due to frequent or repeated coughing or overwork. °¨ Fracture to one or more ribs. °¨ Sore cartilage due to inflammation (costochondritis). °RISK FACTORS  °Risk factors for chest pain may include: °· Activities that increase your risk for trauma or injury to your chest. °· Respiratory infections or conditions that cause frequent coughing. °· Medical conditions or overeating that can cause heartburn. °· Heart disease or family history of heart disease. °· Conditions or health behaviors that increase your risk of developing a blood clot. °· Having had chicken pox (varicella zoster). °SIGNS AND SYMPTOMS °Chest pain can feel like: °· Burning or tingling on the surface of your chest or deep in your chest. °· Crushing, pressure, aching, or squeezing pain. °· Dull or sharp pain that is worse when you move, cough, or take a deep breath. °· Pain that is also felt in your back, neck, shoulder, or arm, or pain that spreads to any of these areas. °Your chest pain may come and go, or it may stay  constant. °DIAGNOSIS °Lab tests or other studies may be needed to find the cause of your pain. Your health care provider may have you take a test called an ambulatory ECG (electrocardiogram). An ECG records your heartbeat patterns at the time the test is performed. You may also have other tests, such as: °· Transthoracic echocardiogram (TTE). During echocardiography, sound waves are used to create a picture of all of the heart structures and to look at how blood flows through your heart. °· Transesophageal echocardiogram (TEE). This is a more advanced imaging test that obtains images from inside your body. It allows your health care provider to see your heart in finer detail. °· Cardiac monitoring. This allows your health care provider to monitor your heart rate and rhythm in real time. °· Holter monitor. This is a portable device that records your heartbeat and can help to diagnose abnormal heartbeats. It allows your health care provider to track your heart activity for several days, if needed. °· Stress tests. These can be done through exercise or by taking medicine that makes your heart beat more quickly. °· Blood tests. °· Imaging tests. °TREATMENT  °Your treatment depends on what is causing your chest pain. Treatment may include: °· Medicines. These may include: °¨ Acid blockers for heartburn. °¨ Anti-inflammatory medicine. °¨ Pain medicine for inflammatory conditions. °¨ Antibiotic medicine, if an infection is present. °¨ Medicines to dissolve blood clots. °¨ Medicines to treat coronary artery disease. °· Supportive care for conditions that do not require medicines. This may include: °¨ Resting. °¨ Applying heat   or cold packs to injured areas. °¨ Limiting activities until pain decreases. °HOME CARE INSTRUCTIONS °· If you were prescribed an antibiotic medicine, finish it all even if you start to feel better. °· Avoid any activities that bring on chest pain. °· Do not use any tobacco products, including  cigarettes, chewing tobacco, or electronic cigarettes. If you need help quitting, ask your health care provider. °· Do not drink alcohol. °· Take medicines only as directed by your health care provider. °· Keep all follow-up visits as directed by your health care provider. This is important. This includes any further testing if your chest pain does not go away. °· If heartburn is the cause for your chest pain, you may be told to keep your head raised (elevated) while sleeping. This reduces the chance that acid will go from your stomach into your esophagus. °· Make lifestyle changes as directed by your health care provider. These may include: °¨ Getting regular exercise. Ask your health care provider to suggest some activities that are safe for you. °¨ Eating a heart-healthy diet. A registered dietitian can help you to learn healthy eating options. °¨ Maintaining a healthy weight. °¨ Managing diabetes, if necessary. °¨ Reducing stress. °SEEK MEDICAL CARE IF: °· Your chest pain does not go away after treatment. °· You have a rash with blisters on your chest. °· You have a fever. °SEEK IMMEDIATE MEDICAL CARE IF:  °· Your chest pain is worse. °· You have an increasing cough, or you cough up blood. °· You have severe abdominal pain. °· You have severe weakness. °· You faint. °· You have chills. °· You have sudden, unexplained chest discomfort. °· You have sudden, unexplained discomfort in your arms, back, neck, or jaw. °· You have shortness of breath at any time. °· You suddenly start to sweat, or your skin gets clammy. °· You feel nauseous or you vomit. °· You suddenly feel light-headed or dizzy. °· Your heart begins to beat quickly, or it feels like it is skipping beats. °These symptoms may represent a serious problem that is an emergency. Do not wait to see if the symptoms will go away. Get medical help right away. Call your local emergency services (911 in the [place] Do not drive yourself to the hospital. °  °This  information is not intended to replace advice given to you by your health care provider. Make sure you discuss any questions you have with your health care provider. °  °Document Released: 03/23/2005 Document Revised: 07/04/2014 Document Reviewed: 01/17/2014 °Elsevier Interactive Patient Education ©2016 [employer] ° °

## 2015-06-10 NOTE — ED Notes (Signed)
Dr. Wilson Singer at bedside at this time.

## 2015-06-11 ENCOUNTER — Encounter: Payer: Self-pay | Admitting: Physician Assistant

## 2015-06-11 ENCOUNTER — Ambulatory Visit (INDEPENDENT_AMBULATORY_CARE_PROVIDER_SITE_OTHER): Payer: BLUE CROSS/BLUE SHIELD | Admitting: Physician Assistant

## 2015-06-11 ENCOUNTER — Other Ambulatory Visit (INDEPENDENT_AMBULATORY_CARE_PROVIDER_SITE_OTHER): Payer: BLUE CROSS/BLUE SHIELD

## 2015-06-11 VITALS — BP 150/60 | HR 89 | Ht 66.5 in | Wt 264.0 lb

## 2015-06-11 DIAGNOSIS — I5032 Chronic diastolic (congestive) heart failure: Secondary | ICD-10-CM | POA: Diagnosis not present

## 2015-06-11 DIAGNOSIS — R0789 Other chest pain: Secondary | ICD-10-CM | POA: Diagnosis not present

## 2015-06-11 NOTE — Progress Notes (Signed)
Cardiology Office Note   Date:  06/11/2015   ID:  Colton Porter, DOB 1966-03-28, MRN [id number]  PCP:  Colton Bush, MD  Cardiologist:  Dr. Meda Porter   Chief Complaint  Patient presents with  . Follow-up    seen for Dr. Meda Porter, chronic diastolic heart failure  . Cirrhosis      History of Present Illness: Colton Porter is a 49 y.o. male who presents for post ED visit followup. He has PMH of hyperlipidemia, CKD stage III, obesity, OSA on CPAP since 06/2013, hypertension, chronic thrombocytopenia with platelet around 50, significant drinking history with subsequent alcoholic cirrhosis. He was recently seen by Dr. Meda Porter on 05/04/2015. He never smoked. He drinks 12-18 beers a day chronically and has no elevation of liver enzyme. He has family history of early CAD with his brother having first MI at age 46. He had left and right heart catheterization on 06/10/2013 which showed normal coronaries, elevated right heart pressure likely related to diastolic dysfunction.  He was admitted twice in June and July for sepsis and cellulitis of the leg. He was diagnosed with liver cirrhosis and currently under workup for liver transplant at [facility]. He says he is still not on the transplant list yet as he has to meet certain criteria and he is striving to meet them. He is still chewing tobacco, but has cut back. He has quit drinking since Apr 2016 and has been doing good to keep away from EtOH. He takes lactulose daily and has been dealing with diarrhea, but no recent AMS. The lower extremity edema was partially secondary to liver failure and hypoalbuminemia. On previous followup, his Lasix was increased to 80 in a.m. and 40 in p.m. Spironolactone was increased to 50 mg BID. However, his lasix was later cut back to 40mg  BID due to rising Cr. He was recently seen in the ED on 05/27/2015 for weight gain and LE swelling.  Lab was repeated with Cr 1.5 which is close to his baseline. ED physician discussed with  Dr. Meda Porter, who feels patient can have increased Lasix based on lab work. He was discharged to followup with cardiology as outpatient. He had a repeat BMET after discharge which showed his Cr was trending up to 1.8, he was again advised to cut lasix down to 40mg  BID. Prior to his followup, he presented back to the ED on 06/10/2015 with chest pain and aches radiating down the left arm. Initial trop was 0.05, but second trop was negative. Given negative cath in 2014 and atypical nature of chest discomfort, he was discharged.    He presents today for 2 week followup. His weight is doing well, average around 264-265. Back in May, his discharge weight from hospital is 230s. His LE edema is chronic and only trace amount. He does not have significant orthopnea or PND. He did have occasional L sided chest pain in the last few days for which he went to the ED on 12/14. But he says he has been lifting a lot of stuff on the left pain and the chest discomfort is noticeable when he raise his arm and pressing on the area.   Note: his sister was present today who is a Dr. Olin Porter patient. Her son was tested positive for genetic testing for arrhythmogenic right ventricular syndrome and wanted to know if it would benefit the patient to be tested as well. But patient denies any have passing out spells. He occasional has tachycardia with exertion, but no  dizziness.    Past Medical History  Diagnosis Date  . Allergy   . GERD (gastroesophageal reflux disease)   . Hyperlipidemia   . Hypertension   . Neuropathy ([place])   . Thrombocytopenia ([place]) 12/15/2011  . Chronic diastolic heart failure ([place]) 11/01/2013  . Alcohol dependence ([place]) 07/11/2012  . Alcoholic cirrhosis of liver with ascites ([place]) 07/2014  . OSA on CPAP 07/11/2012    HST 07/2013:  AHI 39/hr.    . Cellulitis of left leg     Past Surgical History  Procedure Laterality Date  . Hand surgery  1997  . Left and right heart catheterization with coronary angiogram  N/A 06/10/2013    Procedure: LEFT AND RIGHT HEART CATHETERIZATION WITH CORONARY ANGIOGRAM;  Surgeon: Colton Ohara, MD;  Location: [facility] CATH LAB;  Service: Cardiovascular;  Laterality: N/A;  . Colonoscopy  08/2013    hyperplastic polyps, hemorrhoids Ardis Hughs)  . 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 cetirizine (ZYRTEC) 10 MG tablet Take 1 tablet (10 mg total) by mouth daily. 30 tablet 2  . fluticasone (FLONASE) 50 MCG/ACT nasal spray Place 2 sprays into both nostrils daily. 16 g 6  . furosemide (LASIX) 40 MG tablet Take 1 tablet (40 mg total) by mouth 2 (two) times daily.    Marland Kitchen lactulose (CHRONULAC) 10 GM/15ML solution Take 45 mLs (30 g total) by mouth daily. 1892 mL 6  . magnesium oxide (MAG-OX) 400 (241.3 MG) MG tablet Take 1 tablet (400 mg total) by mouth daily. 90 tablet 3  . omeprazole (PRILOSEC) 20 MG capsule Take 1 capsule (20 mg total) by mouth daily. 90 capsule 3  . phytonadione (MEPHYTON) 5 MG tablet Take 2 tablets (10 mg total) by mouth daily. 60 tablet 3  . rizatriptan (MAXALT) 5 MG tablet Take 1 tablet (5 mg total) by mouth as needed for migraine. May repeat in 2 hours if needed 10 tablet 3  . spironolactone (ALDACTONE) 25 MG tablet Take 2 tablets (50 mg total) by mouth 2 (two) times daily. 360 tablet 3  . traMADol (ULTRAM) 50 MG tablet Take 1 tablet (50 mg total) by mouth every 12 (twelve) hours as needed. (Patient taking differently: Take 50 mg by mouth every 12 (twelve) hours as needed for moderate pain. ) 30 tablet 0   No current facility-administered medications for this visit.    Allergies:   Nsaids; Tylenol; Ambien; Hydrochlorothiazide w-triamterene; and Lisinopril    Social History:  The patient  reports that he has never smoked. His smokeless tobacco use includes Chew. He reports that he drinks alcohol. He reports that he uses illicit drugs.   Family  History:  The patient's family history includes Emphysema in his father and mother; Heart disease in his father; Hypertension in his father; Lung cancer in his maternal aunt; Stroke in his mother and paternal grandmother. There is no history of Heart attack.    ROS:  Please see the history of present illness.   Otherwise, review of systems are positive for L sided muscle ache. Trace edema.   All other systems are reviewed and negative.    PHYSICAL EXAM: VS:  BP 150/60 mmHg  Pulse 89  Ht 5' 6.5" (1.689 m)  Wt 264 lb (119.75 kg)  BMI 41.98 kg/m2 , BMI Body mass index is 41.98 kg/(m^2). GEN: Well nourished, well developed, in no acute distress HEENT: normal Neck: no JVD,  carotid bruits, or masses Cardiac: RRR; no murmurs, rubs, or gallops. +trace edema.  Respiratory:  clear to auscultation bilaterally, normal work of breathing GI: soft, nontender, nondistended, + BS MS: no deformity or atrophy Skin: warm and dry, no rash Neuro:  Strength and sensation are intact Psych: euthymic mood, full affect   EKG:  EKG is not ordered today as he had an EKG in the ED yesterday, still has TWI in lead III, unchanged compare to previous EKG   Recent Labs: 08/27/2014: TSH 1.13 12/02/2014: Magnesium 1.6 05/27/2015: B Natriuretic Peptide 172.1* 06/05/2015: ALT 24 06/10/2015: BUN 14; Creatinine, Ser 1.62*; Hemoglobin 11.0*; Platelets 58*; Potassium 3.4*; Sodium 133*    Lipid Panel    Component Value Date/Time   CHOL 209* 11/01/2013 1506   TRIG 172* 11/01/2013 1506   HDL 38* 11/01/2013 1506   CHOLHDL 5.5 11/01/2013 1506   VLDL 34 11/01/2013 1506   LDLCALC 137* 11/01/2013 1506   LDLDIRECT 115.5 11/16/2011 0923      Wt Readings from Last 3 Encounters:  06/11/15 264 lb (119.75 kg)  06/05/15 266 lb 1.9 oz (120.711 kg)  05/27/15 271 lb 4.8 oz (123.061 kg)      Other studies Reviewed: Additional studies/ records that were reviewed today include: Recent ED visit x 2, last cardiology note,  previous cath report. Review of the above records demonstrates: Normal coronaries on cath 2014, recently tried to increase lasix twice however had to go back to 40mg  BID due to worsening renal function.    ASSESSMENT AND PLAN:  1.  Chronic diastolic heart failure  - reportedly had normal echo at [facility] as pretransplant workup, will request record  - weight stable today. Trace edema in LE chronic, no JVD or rale on exam. Discussed HF prevention plan, instructed him to take additional 40mg  lasix if weight > 3 lbs overnight or 5 lbs in a single week. Otherwise, will continue current regimen  2. Left sided muscle ache, very atypical chest discomfort, worse with palpation and arm lifting, likely musculoskeletal: given negative cath in 2014, will not pursue further workup at this time. Reviewed EKG from yesterday with Dr. Angelena Form, no significant ischemic changes when compare to previous EKG noted  3. Hyperlipidemia: not on statin given cirhosis  4. CKD stage III: stable based on BMET yesterday, Cr increased with uptitration of lasix up to 80mg  in AM and 40mg  PM. Renal function improved after cutting lasix dose back down to 40mg  BID  5. Obesity  6. OSA on CPAP since 06/2013  7. Hypertension: BP 150/60, but previously 126/76. Will monitor for now  8. chronic thrombocytopenia with platelet around 50: stable  9. significant drinking history with subsequent alcoholic cirrhosis: undergoing workup for transplant at [facility], still not officially on the transplant list yet per patient. Wife handles most of the transplant correspondence. No EtOH since Apr 2016.    Current medicines are reviewed at length with the patient today.  The patient does not have concerns regarding medicines.  The following changes have been made:  no change  Labs/ tests ordered today include:  No orders of the defined types were placed in this encounter.     Disposition:   FU with Dr. Meda Porter in 2-3 months  Signed, Almyra Deforest  PA 06/11/2015 11:46 AM    [address], [place], [place]  [address] Phone: [phone]; Fax: [phone]

## 2015-06-11 NOTE — Addendum Note (Signed)
Addended by: Eulis Foster on: 06/11/2015 09:59 AM   Modules accepted: Orders

## 2015-06-11 NOTE — Patient Instructions (Addendum)
Medication Instructions:  Your physician recommends that you continue on your current medications as directed. Please refer to the Current Medication list given to you today.   Labwork: None ordered  Testing/Procedures: None ordered  Follow-Up: Your physician recommends that you schedule a follow-up appointment in: 2-3 months with Dr.Nelson   Any Other Special Instructions Will Be Listed Below (If Applicable). We will request your recent echo from [facility] in [place], [place]     If you need a refill on your cardiac medications before your next appointment, please call your pharmacy.  Heart Failure prevention plan:   1. Avoid salty diet 2. Limit total daily fluid intake to <2 Liter per day 3. Weigh yourself every morning, you can take additional dose of 40mg  lasix once if weight increase by more than 3 lbs overnight or 5 lbs in a single week   Continue to avoid alcohol and avoid marijuana. Followup with [facility] regarding timing of liver transplant

## 2015-06-15 ENCOUNTER — Encounter: Payer: Self-pay | Admitting: Family Medicine

## 2015-06-15 ENCOUNTER — Ambulatory Visit (INDEPENDENT_AMBULATORY_CARE_PROVIDER_SITE_OTHER): Payer: BLUE CROSS/BLUE SHIELD | Admitting: Family Medicine

## 2015-06-15 VITALS — BP 130/60 | HR 92 | Temp 98.1°F | Wt 267.2 lb

## 2015-06-15 DIAGNOSIS — F1021 Alcohol dependence, in remission: Secondary | ICD-10-CM

## 2015-06-15 DIAGNOSIS — R600 Localized edema: Secondary | ICD-10-CM | POA: Diagnosis not present

## 2015-06-15 DIAGNOSIS — I10 Essential (primary) hypertension: Secondary | ICD-10-CM

## 2015-06-15 DIAGNOSIS — G4733 Obstructive sleep apnea (adult) (pediatric): Secondary | ICD-10-CM | POA: Diagnosis not present

## 2015-06-15 DIAGNOSIS — J309 Allergic rhinitis, unspecified: Secondary | ICD-10-CM

## 2015-06-15 DIAGNOSIS — Z9989 Dependence on other enabling machines and devices: Secondary | ICD-10-CM

## 2015-06-15 DIAGNOSIS — K7030 Alcoholic cirrhosis of liver without ascites: Secondary | ICD-10-CM

## 2015-06-15 DIAGNOSIS — G4700 Insomnia, unspecified: Secondary | ICD-10-CM

## 2015-06-15 DIAGNOSIS — I5032 Chronic diastolic (congestive) heart failure: Secondary | ICD-10-CM

## 2015-06-15 MED ORDER — POTASSIUM CHLORIDE ER 10 MEQ PO TBCR: 10.0000 meq | EXTENDED_RELEASE_TABLET | Freq: Every day | ORAL | Status: DC

## 2015-06-15 NOTE — Assessment & Plan Note (Signed)
Considering going to substance abuse classes per counselor rec. Pt hesitant for this.

## 2015-06-15 NOTE — Assessment & Plan Note (Signed)
bp well controlled today.  

## 2015-06-15 NOTE — Assessment & Plan Note (Signed)
Continue current regimen. Continue f/u with transplant clinic.

## 2015-06-15 NOTE — Patient Instructions (Addendum)
Try out different strength compression stockings (prescrpition provided today). Use nasal saline after dust exposure, or take a shower when you come in from the workshop.  Start potassium 46mEq once daily. Continue other meds as up to now. Restart CPAP. May try melatonin 3-5mg  at night. Return to see me in 3-4 months for physical.

## 2015-06-15 NOTE — Progress Notes (Signed)
BP 130/60 mmHg  Pulse 92  Temp(Src) 98.1 F (36.7 C) (Oral)  Wt 267 lb 4 oz (121.224 kg)  SpO2 97%   CC: f/u visit  Subjective:    Patient ID: Colton Porter, male    DOB: 11/24/1965, 49 y.o.   MRN: [id number]  HPI: Colton Porter is a 49 y.o. male presenting on 06/15/2015 for Follow-up   Saw cardiology yesterday after eval at ER last week with chest pain.   Persistent sinus congestion after sinusitis - now on flonase and zyrtec. Treated with abx and steroid course.   Persistent pedal edema despite compression stockings. Elevation of legs at night time helps. Taking lasix 40mg  bid, plus spironolactone 50mg  bid. Doesn't notice regular diuresis despite this.   Liver cirrhosis - saw substance abuse counselor who recommended he go through 8-10 weeks of classes. Pt leary of this. Sees transplant center in [place].   Insomnia - persistent trouble. rozerem too expensive. Has not been using CPAP for OSA.   Relevant past medical, surgical, family and social history reviewed and updated as indicated. Interim medical history since our last visit reviewed. Allergies and medications reviewed and updated. Current Outpatient Prescriptions on File Prior to Visit  Medication Sig  . cetirizine (ZYRTEC) 10 MG tablet Take 1 tablet (10 mg total) by mouth daily.  . fluticasone (FLONASE) 50 MCG/ACT nasal spray Place 2 sprays into both nostrils daily.  . furosemide (LASIX) 40 MG tablet Take 1 tablet (40 mg total) by mouth 2 (two) times daily.  Marland Kitchen lactulose (CHRONULAC) 10 GM/15ML solution Take 45 mLs (30 g total) by mouth daily.  . magnesium oxide (MAG-OX) 400 (241.3 MG) MG tablet Take 1 tablet (400 mg total) by mouth daily.  Marland Kitchen omeprazole (PRILOSEC) 20 MG capsule Take 1 capsule (20 mg total) by mouth daily.  . phytonadione (MEPHYTON) 5 MG tablet Take 2 tablets (10 mg total) by mouth daily.  . rizatriptan (MAXALT) 5 MG tablet Take 1 tablet (5 mg total) by mouth as needed for migraine. May repeat in  2 hours if needed  . spironolactone (ALDACTONE) 25 MG tablet Take 2 tablets (50 mg total) by mouth 2 (two) times daily.  . traMADol (ULTRAM) 50 MG tablet Take 1 tablet (50 mg total) by mouth every 12 (twelve) hours as needed. (Patient taking differently: Take 50 mg by mouth every 12 (twelve) hours as needed for moderate pain. )   No current facility-administered medications on file prior to visit.    Review of Systems Per HPI unless specifically indicated in ROS section     Objective:    BP 130/60 mmHg  Pulse 92  Temp(Src) 98.1 F (36.7 C) (Oral)  Wt 267 lb 4 oz (121.224 kg)  SpO2 97%  Wt Readings from Last 3 Encounters:  06/15/15 267 lb 4 oz (121.224 kg)  06/11/15 264 lb (119.75 kg)  06/05/15 266 lb 1.9 oz (120.711 kg)    Physical Exam  Constitutional: He appears well-developed and well-nourished. No distress.  HENT:  Head: Normocephalic and atraumatic.  Nose: No mucosal edema or rhinorrhea.  Mouth/Throat: Oropharynx is clear and moist. No oropharyngeal exudate.  Dry nasal mucosa  Eyes: Conjunctivae are normal. Pupils are equal, round, and reactive to light.  Cardiovascular: Normal rate, regular rhythm and intact distal pulses.   Murmur heard. Pulmonary/Chest: Effort normal and breath sounds normal. No respiratory distress. He has no wheezes. He has no rales.  Musculoskeletal: He exhibits edema (tr pitting edema, more nonptiting edema).  Skin:  Skin is warm and dry. No rash noted.  Psychiatric: He has a normal mood and affect.  Nursing note and vitals reviewed.      Assessment & Plan:   Problem List Items Addressed This Visit    Pedal edema - Primary    Rx for compression stockings 20 or 30 mmHg provided to take to DME store. Continue lasix 40mg  bid + spironolactone 50mg  bid.      OSA on CPAP    Advised to restart CPAP. Per wife has not used for several months.      Insomnia    rec CPAP. May try melatonin 3-5mg  nightly.      History of alcohol dependence ([place])     Considering going to substance abuse classes per counselor rec. Pt hesitant for this.      Essential hypertension    bp well controlled today.      Chronic diastolic heart failure (HCC)    Seems euvolemic today. Appreciate cards care of patient.      Allergic rhinitis    Discussed continued flonase/zyrtec, add nasal saline and shower after allergen exposure.      Alcoholic cirrhosis of liver without ascites (HCC)    Continue current regimen. Continue f/u with transplant clinic.          Follow up plan: Return in about 4 months (around 10/14/2015), or as needed, for annual exam, prior fasting for blood work.

## 2015-06-15 NOTE — Assessment & Plan Note (Signed)
Seems euvolemic today. Appreciate cards care of patient.

## 2015-06-15 NOTE — Assessment & Plan Note (Addendum)
Advised to restart CPAP. Per wife has not used for several months.

## 2015-06-15 NOTE — Assessment & Plan Note (Signed)
rec CPAP. May try melatonin 3-5mg  nightly.

## 2015-06-15 NOTE — Assessment & Plan Note (Signed)
Discussed continued flonase/zyrtec, add nasal saline and shower after allergen exposure.

## 2015-06-15 NOTE — Assessment & Plan Note (Signed)
Rx for compression stockings 20 or 30 mmHg provided to take to DME store. Continue lasix 40mg  bid + spironolactone 50mg  bid.

## 2015-06-15 NOTE — Progress Notes (Signed)
Pre visit review using our clinic review tool, if applicable. No additional management support is needed unless otherwise documented below in the visit note. 

## 2015-06-18 ENCOUNTER — Encounter: Payer: Self-pay | Admitting: Family Medicine

## 2015-06-18 ENCOUNTER — Ambulatory Visit (INDEPENDENT_AMBULATORY_CARE_PROVIDER_SITE_OTHER): Payer: BLUE CROSS/BLUE SHIELD | Admitting: Family Medicine

## 2015-06-18 VITALS — BP 125/65 | HR 94 | Temp 98.3°F | Ht 66.5 in | Wt 268.8 lb

## 2015-06-18 DIAGNOSIS — K7030 Alcoholic cirrhosis of liver without ascites: Secondary | ICD-10-CM

## 2015-06-18 DIAGNOSIS — L03116 Cellulitis of left lower limb: Secondary | ICD-10-CM | POA: Diagnosis not present

## 2015-06-18 DIAGNOSIS — R600 Localized edema: Secondary | ICD-10-CM

## 2015-06-18 MED ORDER — CEFTRIAXONE SODIUM 1 G IJ SOLR
1.0000 g | Freq: Once | INTRAMUSCULAR | Status: AC
  Administered 2015-06-18: 1 g via INTRAMUSCULAR

## 2015-06-18 MED ORDER — DOXYCYCLINE HYCLATE 100 MG PO TABS: 100.0000 mg | ORAL_TABLET | Freq: Two times a day (BID) | ORAL | Status: DC

## 2015-06-18 NOTE — Assessment & Plan Note (Signed)
Continue current diuretics but hold compression hose until infection resolved.

## 2015-06-18 NOTE — Patient Instructions (Addendum)
Elevated legs above heart as able. Start doxycycline 100 mg twice daily. Call if not tolerating antibiotics, not keeping them down or fever. Schedule follow up appt  tommorow.

## 2015-06-18 NOTE — Assessment & Plan Note (Signed)
Treat with IM ceftriaxone 1 g today. Start doxy to cover for MRSA given frequent MD visits. Elevated legs, continue diuretics.  Close follow up tommorow, for possibly repeat ceftriaxone injection.

## 2015-06-18 NOTE — Progress Notes (Signed)
   Subjective:    Patient ID: Colton Porter, male    DOB: 06-17-1966, 49 y.o.   MRN: [id number]  HPI   49 year old male ptof Dr. Darnell Level with history of alcoholic cirrhosis, neuropathy, obesity, pancytopenia presents with burning in his left leg.  He is concerned he may have a cellulitis.   In last 2 days new onset redness, increase in swelling in left leg, burning in left anterior leg. Nausea, one episode vomiting in early. Has been able to drink liquids and food today.  No fever but chills.  Saw Dr.G last on 12/19: Persistent pedal edema despite compression stockings. Elevation of legs at night time helps. Taking lasix 40mg  bid, plus spironolactone 50mg  bid. Doesn't notice regular diuresis despite this.   He has gained 4 lbs in last few days. Wt Readings from Last 3 Encounters:  06/18/15 268 lb 12 oz (121.904 kg)  06/15/15 267 lb 4 oz (121.224 kg)  06/11/15 264 lb (119.75 kg)   Social History /Family History/Past Medical History reviewed and updated if needed.  Hx of cellulitis in left leg in 12/2014. Had 5 day hospital stay.  Review of Systems  Constitutional: Positive for fatigue. Negative for fever.  HENT: Negative for ear pain.   Eyes: Negative for pain.  Respiratory: Negative for cough and shortness of breath.   Cardiovascular: Negative for chest pain and leg swelling.  Gastrointestinal: Negative for abdominal pain.       Objective:   Physical Exam  Constitutional: Vital signs are normal. He appears well-developed and well-nourished.  Obesity, jaundiced, scleral icterus  HENT:  Head: Normocephalic.  Right Ear: Hearing normal.  Left Ear: Hearing normal.  Nose: Nose normal.  Mouth/Throat: Oropharynx is clear and moist and mucous membranes are normal.  Neck: Trachea normal. Carotid bruit is not present. No thyroid mass and no thyromegaly present.  Cardiovascular: Normal rate, regular rhythm and normal pulses.  Exam reveals no gallop, no distant heart sounds and no  friction rub.   No murmur heard. No peripheral edema  Pulmonary/Chest: Effort normal and breath sounds normal. No respiratory distress.  Abdominal: Normal appearance and bowel sounds are normal. He exhibits no fluid wave. There is no tenderness.  Central obesity  Skin: Skin is warm, dry and intact. No rash noted.     Erythema, warmth in left leg, bilateral pitting edema , left greater than right  Marked area of erythema with marker.  Psychiatric: He has a normal mood and affect. His speech is normal and behavior is normal. Thought content normal.          Assessment & Plan:

## 2015-06-18 NOTE — Progress Notes (Signed)
Pre visit review using our clinic review tool, if applicable. No additional management support is needed unless otherwise documented below in the visit note. 

## 2015-06-18 NOTE — Assessment & Plan Note (Signed)
No adjustment of antibiotics needed for liver issues.

## 2015-06-19 ENCOUNTER — Ambulatory Visit (INDEPENDENT_AMBULATORY_CARE_PROVIDER_SITE_OTHER): Payer: BLUE CROSS/BLUE SHIELD | Admitting: Family Medicine

## 2015-06-19 ENCOUNTER — Encounter: Payer: Self-pay | Admitting: Family Medicine

## 2015-06-19 VITALS — BP 128/76 | HR 99 | Temp 98.8°F | Wt 269.0 lb

## 2015-06-19 DIAGNOSIS — L03116 Cellulitis of left lower limb: Secondary | ICD-10-CM

## 2015-06-19 MED ORDER — CEFTRIAXONE SODIUM 1 G IJ SOLR
1.0000 g | Freq: Once | INTRAMUSCULAR | Status: AC
  Administered 2015-06-19: 1 g via INTRAMUSCULAR

## 2015-06-19 NOTE — Progress Notes (Signed)
   Subjective:    Patient ID: Colton Porter, male    DOB: 1965-09-11, 49 y.o.   MRN: [id number]  HPI    49 year old male pt with history of alcoholic cirrhosis presents for 1 day follow up  On left leg cellulitis.  Started on doxycycline yesterday and given ceftriaxone injection.  Today he reports   Slight change on left leg, improvement, decreased swelling , decreased pain.  No fever, N/V. Tolerating antibiotics well.  Review of Systems  Constitutional: Positive for fatigue. Negative for fever.  HENT: Negative for ear pain.   Eyes: Negative for pain.  Respiratory: Negative for cough and shortness of breath.   Cardiovascular: Negative for chest pain and leg swelling.  Gastrointestinal: Negative for abdominal pain.       Objective:   Physical Exam  Constitutional: Vital signs are normal. He appears well-developed and well-nourished.  Obesity, jaundiced, scleral icterus  HENT:  Head: Normocephalic.  Right Ear: Hearing normal.  Left Ear: Hearing normal.  Nose: Nose normal.  Mouth/Throat: Oropharynx is clear and moist and mucous membranes are normal.  Neck: Trachea normal. Carotid bruit is not present. No thyroid mass and no thyromegaly present.  Cardiovascular: Normal rate, regular rhythm and normal pulses.  Exam reveals no gallop, no distant heart sounds and no friction rub.   No murmur heard. Pulmonary/Chest: Effort normal and breath sounds normal. No respiratory distress.  Abdominal: Normal appearance and bowel sounds are normal. He exhibits no fluid wave. There is no tenderness.  Central obesity  Skin: Skin is warm, dry and intact. No rash noted.     Alight decreased in erythema,  Still warmth in left leg, bilateral pitting edema , left greater than right   No spread of erythema beyond area marked with marker.  Psychiatric: He has a normal mood and affect. His speech is normal and behavior is normal. Thought content normal.          Assessment & Plan:

## 2015-06-19 NOTE — Assessment & Plan Note (Signed)
Slight improvement.  Complete oral antibiotics. Will repeat IM rocephin 1 gram today. Pt to call if not continuing to improve.

## 2015-06-19 NOTE — Patient Instructions (Signed)
Continue oral antibiotics.  Elevated leg. Can apply cream to leg.

## 2015-06-23 NOTE — ED Provider Notes (Signed)
CSN: [id number]     Arrival date & time 06/09/15  2345 History   First MD Initiated Contact with Patient 06/10/15 0315     Chief Complaint  Patient presents with  . Chest Pain     (Consider location/radiation/quality/duration/timing/severity/associated sxs/prior Treatment) HPI   49 year old male with left-sided chest pain. Onset yesterday while at rest. Has been persistent since then. Patient states that the left side of his body hurts. L side of his chest, left upper extremity and left lower extremity. Mild headache. No shortness of breath. No diaphoresis. No nausea. Has not noticed any change in his symptoms with exertion. No unusual leg pain or swelling. No fevers or chills. No cough.  Past Medical History  Diagnosis Date  . Allergy   . GERD (gastroesophageal reflux disease)   . Hyperlipidemia   . Hypertension   . Neuropathy ([place])   . Thrombocytopenia ([place]) 12/15/2011  . Chronic diastolic heart failure ([place]) 11/01/2013  . Alcohol dependence ([place]) 07/11/2012  . Alcoholic cirrhosis of liver with ascites ([place]) 07/2014  . OSA on CPAP 07/11/2012    HST 07/2013:  AHI 39/hr.    . Cellulitis of left leg    Past Surgical History  Procedure Laterality Date  . Hand surgery  1997  . Left and right heart catheterization with coronary angiogram N/A 06/10/2013    Procedure: LEFT AND RIGHT HEART CATHETERIZATION WITH CORONARY ANGIOGRAM;  Surgeon: Blane Ohara, MD;  Location: [facility] CATH LAB;  Service: Cardiovascular;  Laterality: N/A;  . Colonoscopy  08/2013    hyperplastic polyps, hemorrhoids Ardis Hughs)  . 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 Stroke Mother   . Hypertension Father   . Heart disease Father   . Lung cancer Maternal Aunt   . Emphysema Mother   . Emphysema Father   . Heart attack Neg Hx   . Stroke Paternal Grandmother    Social History  Substance Use Topics  . Smoking status:  Never Smoker   . Smokeless tobacco: Current User    Types: Chew  . Alcohol Use: 0.0 oz/week    0 Standard drinks or equivalent per week     Comment: 4-6 drinks daily - NO ETOH SINCE APRIL 2016    Review of Systems  All systems reviewed and negative, other than as noted in HPI.   Allergies  Nsaids; Tylenol; Ambien; Hydrochlorothiazide w-triamterene; and Lisinopril  Home Medications   Prior to Admission medications   Medication Sig Start Date End Date Taking? Authorizing Provider  cetirizine (ZYRTEC) 10 MG tablet Take 1 tablet (10 mg total) by mouth daily. 05/20/15  Yes Jearld Fenton, NP  fluticasone (FLONASE) 50 MCG/ACT nasal spray Place 2 sprays into both nostrils daily. 05/20/15  Yes Jearld Fenton, NP  furosemide (LASIX) 40 MG tablet Take 1 tablet (40 mg total) by mouth 2 (two) times daily. 06/08/15  Yes Dorothy Spark, MD  lactulose (CHRONULAC) 10 GM/15ML solution Take 45 mLs (30 g total) by mouth daily. 05/13/15  Yes Ria Bush, MD  magnesium oxide (MAG-OX) 400 (241.3 MG) MG tablet Take 1 tablet (400 mg total) by mouth daily. 04/22/15  Yes Ria Bush, MD  omeprazole (PRILOSEC) 20 MG capsule Take 1 capsule (20 mg total) by mouth daily. 04/22/15  Yes Ria Bush, MD  phytonadione (MEPHYTON) 5 MG tablet Take 2 tablets (10 mg total) by mouth daily. 04/22/15  Yes Ria Bush, MD  rizatriptan (MAXALT) 5 MG tablet Take 1 tablet (5 mg total) by mouth as needed for migraine. May repeat in 2 hours if needed 02/19/15  Yes Ria Bush, MD  spironolactone (ALDACTONE) 25 MG tablet Take 2 tablets (50 mg total) by mouth 2 (two) times daily. 05/07/15  Yes Dorothy Spark, MD  traMADol (ULTRAM) 50 MG tablet Take 1 tablet (50 mg total) by mouth every 12 (twelve) hours as needed. Patient taking differently: Take 50 mg by mouth every 12 (twelve) hours as needed for moderate pain.  06/05/15  Yes Ria Bush, MD  doxycycline (VIBRA-TABS) 100 MG tablet Take 1 tablet  (100 mg total) by mouth 2 (two) times daily. 06/18/15   Jinny Sanders, MD  KLOR-CON M10 10 MEQ tablet  06/15/15   Historical Provider, MD  potassium chloride (K-DUR) 10 MEQ tablet Take 1 tablet (10 mEq total) by mouth daily. 06/15/15   Ria Bush, MD   BP 131/68 mmHg  Pulse 87  Temp(Src) 99.2 F (37.3 C) (Oral)  Resp 19  SpO2 90% Physical Exam  Constitutional: He appears well-developed and well-nourished. No distress.  HENT:  Head: Normocephalic and atraumatic.  Eyes: Conjunctivae are normal. Right eye exhibits no discharge. Left eye exhibits no discharge.  Neck: Neck supple.  Cardiovascular: Normal rate, regular rhythm and normal heart sounds.  Exam reveals no gallop and no friction rub.   No murmur heard. Pulmonary/Chest: Effort normal and breath sounds normal. No respiratory distress.  Abdominal: Soft. He exhibits no distension. There is no tenderness.  Musculoskeletal: He exhibits no edema or tenderness.  Neurological: He is alert.  Skin: Skin is warm and dry.  Psychiatric: He has a normal mood and affect. His behavior is normal. Thought content normal.  Nursing note and vitals reviewed.   ED Course  Procedures (including critical care time) Labs Review Labs Reviewed  BASIC METABOLIC PANEL - Abnormal; Notable for the following:    Sodium 133 (*)    Potassium 3.4 (*)    Chloride 98 (*)    Glucose, Bld 100 (*)    Creatinine, Ser 1.62 (*)    Calcium 8.5 (*)    GFR calc non Af Amer 49 (*)    GFR calc Af Amer 56 (*)    All other components within normal limits  CBC - Abnormal; Notable for the following:    RBC 3.21 (*)    Hemoglobin 11.0 (*)    HCT 31.5 (*)    MCH 34.3 (*)    RDW 16.0 (*)    Platelets 58 (*)    All other components within normal limits  TROPONIN I - Abnormal; Notable for the following:    Troponin I 0.05 (*)    All other components within normal limits  TROPONIN I  I-STAT TROPOININ, ED    Imaging Review No results found.   Dg Chest 2  View  06/10/2015  CLINICAL DATA:  Pain under the left breast, acute onset. Initial encounter. EXAM: CHEST  2 VIEW COMPARISON:  Chest radiograph performed 05/27/2015 FINDINGS: The lungs are well-aerated. Pulmonary vascularity is at the upper limits of normal. There is no evidence of focal opacification, pleural effusion or pneumothorax. The heart is normal in size; the mediastinal contour is within normal limits. No acute osseous abnormalities are seen. IMPRESSION: No acute cardiopulmonary process seen. Electronically Signed   By: Garald Balding M.D.   On: 06/10/2015 00:03   Dg Chest 2 View  05/27/2015  CLINICAL DATA:  Fluid retention. Lower  extremity swelling and bloating for 1 week. EXAM: CHEST  2 VIEW COMPARISON:  11/21/2014 FINDINGS: The heart size and mediastinal contours are within normal limits. Both lungs are clear. The visualized skeletal structures are unremarkable. IMPRESSION: No active cardiopulmonary disease. Electronically Signed   By: Earle Gell M.D.   On: 05/27/2015 15:45   I have personally reviewed and evaluated these images and lab results as part of my medical decision-making.   EKG Interpretation   Date/Time:  Tuesday June 09 2015 23:45:40 EST Ventricular Rate:  103 PR Interval:  146 QRS Duration: 84 QT Interval:  366 QTC Calculation: 479 R Axis:   62 Text Interpretation:  Sinus tachycardia T wave abnormality, consider  inferior ischemia Abnormal ECG Confirmed by Wilson Singer  MD, Kymoni Monday ([id number]) on  06/10/2015 3:15:33 AM      MDM   Final diagnoses:  Chest pain, unspecified chest pain type    49 year old male with chest pain. My impression is that this is atypical for ACS. Patient did have a minimally elevated troponin. Subsequent troponin 3 hours later was normal though. Thrombocytopenia but he Has a History of Alcohol Abuse and This Appears to Be Chronic.  Virgel Manifold, MD 06/23/15 [phone]

## 2015-07-06 ENCOUNTER — Other Ambulatory Visit: Payer: BLUE CROSS/BLUE SHIELD

## 2015-07-08 ENCOUNTER — Encounter: Payer: Self-pay | Admitting: Cardiology

## 2015-07-08 ENCOUNTER — Ambulatory Visit (INDEPENDENT_AMBULATORY_CARE_PROVIDER_SITE_OTHER): Payer: BLUE CROSS/BLUE SHIELD | Admitting: Cardiology

## 2015-07-08 ENCOUNTER — Ambulatory Visit: Payer: BLUE CROSS/BLUE SHIELD | Admitting: Cardiology

## 2015-07-08 VITALS — BP 128/56 | HR 90 | Ht 67.0 in | Wt 266.8 lb

## 2015-07-08 DIAGNOSIS — E785 Hyperlipidemia, unspecified: Secondary | ICD-10-CM

## 2015-07-08 DIAGNOSIS — K7031 Alcoholic cirrhosis of liver with ascites: Secondary | ICD-10-CM

## 2015-07-08 DIAGNOSIS — I5032 Chronic diastolic (congestive) heart failure: Secondary | ICD-10-CM | POA: Diagnosis not present

## 2015-07-08 DIAGNOSIS — I10 Essential (primary) hypertension: Secondary | ICD-10-CM

## 2015-07-08 NOTE — Progress Notes (Signed)
Patient ID: Colton Porter, male   DOB: [birth year], 50 y.o.   MRN: [id number]       Cardiology Office Note  Date:  07/08/2015   ID:  Colton Porter, DOB 1965/11/13, MRN [id number]  PCP:  Ria Bush, MD  Cardiologist:  Dr. Meda Coffee   Chief Complaint  Patient presents with  . Follow-up    hypertension      History of Present Illness: Colton Porter is a 50 y.o. male who presents for post ED visit followup. He has PMH of hyperlipidemia, CKD stage III, obesity, OSA on CPAP since 06/2013, hypertension, chronic thrombocytopenia with platelet around 50, significant drinking history with subsequent alcoholic cirrhosis. He was recently seen by Dr. Meda Coffee on 05/04/2015. He never smoked. He drinks 12-18 beers a day chronically and has no elevation of liver enzyme. He has family history of early CAD with his brother having first MI at age 63. He had left and right heart catheterization on 06/10/2013 which showed normal coronaries, elevated right heart pressure likely related to diastolic dysfunction.  He was admitted twice in June and July for sepsis and cellulitis of the leg. He was diagnosed with liver cirrhosis and currently under workup for liver transplant at [facility]. He says he is still not on the transplant list yet as he has to meet certain criteria and he is striving to meet them. He is still chewing tobacco, but has cut back. He has quit drinking since Apr 2016 and has been doing good to keep away from EtOH. He takes lactulose daily and has been dealing with diarrhea, but no recent AMS. The lower extremity edema was partially secondary to liver failure and hypoalbuminemia. On previous followup, his Lasix was increased to 80 in a.m. and 40 in p.m. Spironolactone was increased to 50 mg BID. However, his lasix was later cut back to 40mg  BID due to rising Cr. He was recently seen in the ED on 05/27/2015 for weight gain and LE swelling.  Lab was repeated with Cr 1.5 which is close to his baseline. ED  physician discussed with Dr. Meda Coffee, who feels patient can have increased Lasix based on lab work. He was discharged to followup with cardiology as outpatient. He had a repeat BMET after discharge which showed his Cr was trending up to 1.8, he was again advised to cut lasix down to 40mg  BID. Prior to his followup, he presented back to the ED on 06/10/2015 with chest pain and aches radiating down the left arm. Initial trop was 0.05, but second trop was negative. Given negative cath in 2014 and atypical nature of chest discomfort, he was discharged.    He presents today for 2 week followup. His weight is doing well, average around 264-265. Back in May, his discharge weight from hospital is 230s. His LE edema is chronic and only trace amount. He does not have significant orthopnea or PND. He did have occasional L sided chest pain in the last few days for which he went to the ED on 12/14. But he says he has been lifting a lot of stuff on the left pain and the chest discomfort is noticeable when he raise his arm and pressing on the area.   Note: his sister was present today who is a Dr. Olin Pia patient. Her son was tested positive for genetic testing for arrhythmogenic right ventricular syndrome and wanted to know if it would benefit the patient to be tested as well. But patient denies any have passing out  spells. He occasional has tachycardia with exertion, but no dizziness.   07/08/2015 - this is a 1 month follow-up, the patient is doing well from cardiac standpoints and has stable mild lower extremity edema that is worse at night, stable weight, his major concern is frustration from preliver transplant evaluation. He denies any palpitations or syncope. He has occasional paroxysmal nocturnal dyspnea but otherwise no orthopnea, no chest pain no significant dyspnea on exertion.   Past Medical History  Diagnosis Date  . Allergy   . GERD (gastroesophageal reflux disease)   . Hyperlipidemia   . Hypertension     . Neuropathy ([place])   . Thrombocytopenia ([place]) 12/15/2011  . Chronic diastolic heart failure ([place]) 11/01/2013  . Alcohol dependence ([place]) 07/11/2012  . Alcoholic cirrhosis of liver with ascites ([place]) 07/2014  . OSA on CPAP 07/11/2012    HST 07/2013:  AHI 39/hr.    . Cellulitis of left leg     Past Surgical History  Procedure Laterality Date  . Hand surgery  1997  . Left and right heart catheterization with coronary angiogram N/A 06/10/2013    Procedure: LEFT AND RIGHT HEART CATHETERIZATION WITH CORONARY ANGIOGRAM;  Surgeon: Blane Ohara, MD;  Location: [facility] CATH LAB;  Service: Cardiovascular;  Laterality: N/A;  . Colonoscopy  08/2013    hyperplastic polyps, hemorrhoids Ardis Hughs)  . 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 cetirizine (ZYRTEC) 10 MG tablet Take 1 tablet (10 mg total) by mouth daily. 30 tablet 2  . doxycycline (VIBRA-TABS) 100 MG tablet Take 1 tablet (100 mg total) by mouth 2 (two) times daily. 20 tablet 0  . fluticasone (FLONASE) 50 MCG/ACT nasal spray Place 2 sprays into both nostrils daily. 16 g 6  . furosemide (LASIX) 40 MG tablet Take 1 tablet (40 mg total) by mouth 2 (two) times daily.    Marland Kitchen lactulose (CHRONULAC) 10 GM/15ML solution Take 45 mLs (30 g total) by mouth daily. 1892 mL 6  . magnesium oxide (MAG-OX) 400 (241.3 MG) MG tablet Take 1 tablet (400 mg total) by mouth daily. 90 tablet 3  . omeprazole (PRILOSEC) 20 MG capsule Take 1 capsule (20 mg total) by mouth daily. 90 capsule 3  . phytonadione (MEPHYTON) 5 MG tablet Take 2 tablets (10 mg total) by mouth daily. 60 tablet 3  . potassium chloride (K-DUR) 10 MEQ tablet Take 1 tablet (10 mEq total) by mouth daily. 30 tablet 3  . rizatriptan (MAXALT) 5 MG tablet Take 5 mg by mouth as needed for migraine (may repeat in 2 hours if needed for migraine relief). May repeat in 2 hours if needed    .  spironolactone (ALDACTONE) 25 MG tablet Take 2 tablets (50 mg total) by mouth 2 (two) times daily. 360 tablet 3  . traMADol (ULTRAM) 50 MG tablet Take 50 mg by mouth every 12 (twelve) hours as needed for moderate pain.     No current facility-administered medications for this visit.    Allergies:   Nsaids; Tylenol; Ambien; Hydrochlorothiazide w-triamterene; and Lisinopril    Social History:  The patient  reports that he has never smoked. His smokeless tobacco use includes Chew. He reports that he drinks alcohol. He reports that he uses illicit drugs.   Family History:  The patient's family history includes Emphysema in his father and mother; Heart disease in his father; Hypertension in his father; Lung cancer in his  maternal aunt; Stroke in his mother and paternal grandmother. There is no history of Heart attack.    ROS:  Please see the history of present illness.   Otherwise, review of systems are positive for L sided muscle ache. Trace edema.   All other systems are reviewed and negative.    PHYSICAL EXAM: VS:  BP 128/56 mmHg  Pulse 90  Ht 5\' 7"  (1.702 m)  Wt 266 lb 12.8 oz (121.02 kg)  BMI 41.78 kg/m2 , BMI Body mass index is 41.78 kg/(m^2). GEN: Well nourished, well developed, in no acute distress, significant jaundice HEENT: normal Neck: no JVD, carotid bruits, or masses Cardiac: RRR; no murmurs, rubs, or gallops. Minimal edema. Bilaterally Respiratory:  clear to auscultation bilaterally, normal work of breathing GI: soft, nontender, distended, + BS MS: no deformity or atrophy Skin: warm and dry, no rash Neuro:  Strength and sensation are intact Psych: euthymic mood, full affect   EKG:  EKG is not ordered today as he had an EKG in the ED yesterday, still has TWI in lead III, unchanged compare to previous EKG   Recent Labs: 08/27/2014: TSH 1.13 12/02/2014: Magnesium 1.6 05/27/2015: B Natriuretic Peptide 172.1* 06/05/2015: ALT 24 06/10/2015: BUN 14; Creatinine, Ser 1.62*;  Hemoglobin 11.0*; Platelets 58*; Potassium 3.4*; Sodium 133*    Lipid Panel    Component Value Date/Time   CHOL 209* 11/01/2013 1506   TRIG 172* 11/01/2013 1506   HDL 38* 11/01/2013 1506   CHOLHDL 5.5 11/01/2013 1506   VLDL 34 11/01/2013 1506   LDLCALC 137* 11/01/2013 1506   LDLDIRECT 115.5 11/16/2011 0923      Wt Readings from Last 3 Encounters:  07/08/15 266 lb 12.8 oz (121.02 kg)  06/19/15 269 lb (122.018 kg)  06/18/15 268 lb 12 oz (121.904 kg)    TTE: 11/26/2014 - Left ventricle: The cavity size was normal. Wall thickness was normal. Systolic function was normal. The estimated ejection fraction was in the range of 60% to 65%. Wall motion was normal; there were no regional wall motion abnormalities. - Left atrium: The atrium was mildly dilated. - Right atrium: The atrium was mildly dilated.  Other studies Reviewed: Additional studies/ records that were reviewed today include: Recent ED visit x 2, last cardiology note, previous cath report. Review of the above records demonstrates: Normal coronaries on cath 2014, recently tried to increase lasix twice however had to go back to 40mg  BID due to worsening renal function.     ASSESSMENT AND PLAN:  1.  Chronic diastolic heart failure  - The patient has normal systolic and diastolic function, his fluid overload is most probably secondary to hypoalbuminemia associated with liver cirrhosis his volume status is stable and he appears euvolemic. We'll continue the same regimen with Lasix 40 mg twice a day and spironolactone 50 mg daily.  2. Left sided muscle ache, very atypical chest discomfort, worse with palpation and arm lifting, likely musculoskeletal: given negative cath in 2014, will not pursue further workup at this time. This has resolved since the last visit.   3. Hyperlipidemia: not on statin given cirhosis  4. CKD stage III: Stable we'll continue the same regimen with diuretics.   5. Obesity  6. OSA on CPAP since  06/2013  7. Hypertension: Controlled now.  8. Chronic thrombocytopenia with platelet around 50: stable  9. Significant drinking history with subsequent alcoholic cirrhosis: undergoing workup for transplant at [facility], still not officially on the transplant list yet per patient. Wife handles most of the  transplant correspondence. No EtOH since Apr 2016.   Follow-up in 3 months.   Signed, Dorothy Spark, MD  07/08/2015 11:15 AM    [address], [place], [place]  [address] Phone: [phone]; Fax: [phone]

## 2015-07-08 NOTE — Patient Instructions (Signed)
Your physician recommends that you continue on your current medications as directed. Please refer to the Current Medication list given to you today.    Your physician recommends that you schedule a follow-up appointment in: 3 MONTHS WITH DR NELSON  

## 2015-07-09 ENCOUNTER — Ambulatory Visit (INDEPENDENT_AMBULATORY_CARE_PROVIDER_SITE_OTHER): Payer: BLUE CROSS/BLUE SHIELD | Admitting: Family Medicine

## 2015-07-09 ENCOUNTER — Encounter: Payer: Self-pay | Admitting: Family Medicine

## 2015-07-09 VITALS — BP 130/66 | HR 96 | Temp 98.3°F | Wt 269.0 lb

## 2015-07-09 DIAGNOSIS — L03116 Cellulitis of left lower limb: Secondary | ICD-10-CM | POA: Diagnosis not present

## 2015-07-09 DIAGNOSIS — K7290 Hepatic failure, unspecified without coma: Secondary | ICD-10-CM

## 2015-07-09 DIAGNOSIS — F321 Major depressive disorder, single episode, moderate: Secondary | ICD-10-CM | POA: Insufficient documentation

## 2015-07-09 DIAGNOSIS — I5032 Chronic diastolic (congestive) heart failure: Secondary | ICD-10-CM | POA: Diagnosis not present

## 2015-07-09 DIAGNOSIS — K219 Gastro-esophageal reflux disease without esophagitis: Secondary | ICD-10-CM

## 2015-07-09 DIAGNOSIS — K7030 Alcoholic cirrhosis of liver without ascites: Secondary | ICD-10-CM | POA: Diagnosis not present

## 2015-07-09 DIAGNOSIS — R4589 Other symptoms and signs involving emotional state: Secondary | ICD-10-CM

## 2015-07-09 DIAGNOSIS — F329 Major depressive disorder, single episode, unspecified: Secondary | ICD-10-CM

## 2015-07-09 DIAGNOSIS — K7682 Hepatic encephalopathy: Secondary | ICD-10-CM

## 2015-07-09 LAB — COMPREHENSIVE METABOLIC PANEL
ALK PHOS: 221 U/L — AB (ref 39–117)
ALT: 30 U/L (ref 0–53)
AST: 53 U/L — AB (ref 0–37)
Albumin: 2.5 g/dL — ABNORMAL LOW (ref 3.5–5.2)
BILIRUBIN TOTAL: 4.4 mg/dL — AB (ref 0.2–1.2)
BUN: 19 mg/dL (ref 6–23)
CO2: 29 mEq/L (ref 19–32)
CREATININE: 1.98 mg/dL — AB (ref 0.40–1.50)
Calcium: 8.9 mg/dL (ref 8.4–10.5)
Chloride: 98 mEq/L (ref 96–112)
GFR: 38.4 mL/min — AB (ref >60.00)
GLUCOSE: 105 mg/dL — AB (ref 70–99)
Potassium: 4.8 mEq/L (ref 3.5–5.1)
SODIUM: 129 meq/L — AB (ref 135–145)
TOTAL PROTEIN: 7.3 g/dL (ref 6.0–8.3)

## 2015-07-09 LAB — CBC WITH DIFFERENTIAL/PLATELET
BASOS ABS: 0 10*3/uL (ref 0.0–0.1)
Basophils Relative: 0.4 % (ref 0.0–3.0)
EOS ABS: 0.2 10*3/uL (ref 0.0–0.7)
Eosinophils Relative: 2.6 % (ref 0.0–5.0)
HCT: 32.9 % — ABNORMAL LOW (ref 39.0–52.0)
Hemoglobin: 11.1 g/dL — ABNORMAL LOW (ref 13.0–17.0)
LYMPHS ABS: 0.8 10*3/uL (ref 0.7–4.0)
Lymphocytes Relative: 13.3 % (ref 12.0–46.0)
MCHC: 33.7 g/dL (ref 30.0–36.0)
MCV: 102.1 fl — ABNORMAL HIGH (ref 78.0–100.0)
MONO ABS: 0.6 10*3/uL (ref 0.1–1.0)
Monocytes Relative: 9.6 % (ref 3.0–12.0)
NEUTROS ABS: 4.3 10*3/uL (ref 1.4–7.7)
NEUTROS PCT: 74.1 % (ref 43.0–77.0)
RBC: 3.23 Mil/uL — AB (ref 4.22–5.81)
RDW: 16.7 % — ABNORMAL HIGH (ref 11.5–15.5)
WBC: 5.8 10*3/uL (ref 4.0–10.5)

## 2015-07-09 MED ORDER — CITALOPRAM HYDROBROMIDE 10 MG PO TABS: 5.0000 mg | ORAL_TABLET | Freq: Every day | ORAL | Status: DC

## 2015-07-09 MED ORDER — PANTOPRAZOLE SODIUM 20 MG PO TBEC: 20.0000 mg | DELAYED_RELEASE_TABLET | Freq: Every day | ORAL | Status: DC

## 2015-07-09 NOTE — Progress Notes (Addendum)
BP 130/66 mmHg  Pulse 96  Temp(Src) 98.3 F (36.8 C) (Oral)  Wt 269 lb (122.018 kg)   CC: f/u visit  Subjective:    Patient ID: Colton Porter, male    DOB: 05/04/1966, 50 y.o.   MRN: [id number]  HPI: Colton Porter is a 50 y.o. male presenting on 07/09/2015 for Follow-up   1 mo f/u visit - treated for LLE cellulitis last month with IM rocephin and doxy.   Requests ammonia level checked today - doesn't feel lactulose working as well as it prior was. Increased gassiness. Took a bit extra lactulose today which has helped. Goal is 3-4 stools/day. "A little loopy".   To start substance abuse counseling. Has f/u appt Feb 13th at [facility]. Gets down and depressed when he reads about liver disease on internet.  Relevant past medical, surgical, family and social history reviewed and updated as indicated. Interim medical history since our last visit reviewed. Allergies and medications reviewed and updated. Current Outpatient Prescriptions on File Prior to Visit  Medication Sig  . cetirizine (ZYRTEC) 10 MG tablet Take 1 tablet (10 mg total) by mouth daily.  . fluticasone (FLONASE) 50 MCG/ACT nasal spray Place 2 sprays into both nostrils daily.  . furosemide (LASIX) 40 MG tablet Take 1 tablet (40 mg total) by mouth 2 (two) times daily.  Marland Kitchen lactulose (CHRONULAC) 10 GM/15ML solution Take 45 mLs (30 g total) by mouth daily.  . magnesium oxide (MAG-OX) 400 (241.3 MG) MG tablet Take 1 tablet (400 mg total) by mouth daily.  . phytonadione (MEPHYTON) 5 MG tablet Take 2 tablets (10 mg total) by mouth daily.  . potassium chloride (K-DUR) 10 MEQ tablet Take 1 tablet (10 mEq total) by mouth daily.  . rizatriptan (MAXALT) 5 MG tablet Take 5 mg by mouth as needed for migraine (may repeat in 2 hours if needed for migraine relief). May repeat in 2 hours if needed  . spironolactone (ALDACTONE) 25 MG tablet Take 2 tablets (50 mg total) by mouth 2 (two) times daily.  . traMADol (ULTRAM) 50 MG tablet Take 50 mg  by mouth every 12 (twelve) hours as needed for moderate pain.   No current facility-administered medications on file prior to visit.    Review of Systems Per HPI unless specifically indicated in ROS section     Objective:    BP 130/66 mmHg  Pulse 96  Temp(Src) 98.3 F (36.8 C) (Oral)  Wt 269 lb (122.018 kg)  Wt Readings from Last 3 Encounters:  07/09/15 269 lb (122.018 kg)  07/08/15 266 lb 12.8 oz (121.02 kg)  06/19/15 269 lb (122.018 kg)   Body mass index is 42.12 kg/(m^2).  Physical Exam  Constitutional: He appears well-developed and well-nourished. No distress.  HENT:  Head: Normocephalic and atraumatic.  Mouth/Throat: Oropharynx is clear and moist.  Eyes: Conjunctivae and EOM are normal. Pupils are equal, round, and reactive to light. No scleral icterus.  Neck: Normal range of motion. Neck supple.  Cardiovascular: Normal rate, regular rhythm, normal heart sounds and intact distal pulses.   No murmur heard. Pulmonary/Chest: Effort normal and breath sounds normal. No respiratory distress. He has no wheezes. He has no rales.  Abdominal: Soft. Bowel sounds are normal. He exhibits no distension and no mass. There is no tenderness. There is no rebound and no guarding.  Musculoskeletal: He exhibits edema (tr).  Skin: Skin is warm and dry. No rash noted.  Psychiatric: He has a normal mood and affect.  Nursing  note and vitals reviewed.     Assessment & Plan:   Problem List Items Addressed This Visit    RESOLVED: Left leg cellulitis    This has resolved after treatment with doxy/ceftriaxone.      Hepatic encephalopathy (HCC)    Check ammonia.      GERD    Change omeprazole to pantoprazole due to possible interaction with celexa.      Relevant Medications   pantoprazole (PROTONIX) 20 MG tablet   Depressed mood    Endorses some depressed mood over liver disease. Interested in trial pharmacotherapy for this - will trial low dose celexa 5mg  daily. Reassess 1 mo       Chronic diastolic heart failure (HCC)    Stable period.       Alcoholic cirrhosis of liver without ascites (HCC) - Primary    Stable period. Continue current regimen and f/u with [facility]. Check labs today. Latest MELD score 18      Relevant Orders   Comprehensive metabolic panel   CBC with Differential/Platelet   AMMONIA       Follow up plan: Return if symptoms worsen or fail to improve, for follow up visit.

## 2015-07-09 NOTE — Assessment & Plan Note (Signed)
Stable period.  

## 2015-07-09 NOTE — Assessment & Plan Note (Signed)
Endorses some depressed mood over liver disease. Interested in trial pharmacotherapy for this - will trial low dose celexa 5mg  daily. Reassess 1 mo

## 2015-07-09 NOTE — Addendum Note (Signed)
Addended by: Ria Bush on: 07/09/2015 02:47 PM   Modules accepted: Level of Service

## 2015-07-09 NOTE — Assessment & Plan Note (Signed)
Change omeprazole to pantoprazole due to possible interaction with celexa.

## 2015-07-09 NOTE — Assessment & Plan Note (Signed)
This has resolved after treatment with doxy/ceftriaxone.

## 2015-07-09 NOTE — Assessment & Plan Note (Signed)
Check ammonia.

## 2015-07-09 NOTE — Progress Notes (Signed)
Pre visit review using our clinic review tool, if applicable. No additional management support is needed unless otherwise documented below in the visit note. 

## 2015-07-09 NOTE — Patient Instructions (Addendum)
I think you are doing well today.  Blood work today.  Try celexa 5mg  (1/2 tablet) daily for mood.  Stop omeprazole as it can interact with celexa. Start pantoprazole 20mg  once daily (sent to pharmacy). Keep next follow up appointment

## 2015-07-09 NOTE — Assessment & Plan Note (Addendum)
Stable period. Continue current regimen and f/u with [facility]. Check labs today. Latest MELD score 18

## 2015-07-10 LAB — AMMONIA: AMMONIA: 42 umol/L (ref 16–53)

## 2015-07-10 NOTE — Addendum Note (Signed)
Addended by: Freada Bergeron on: 07/10/2015 09:28 AM   Modules accepted: Orders

## 2015-07-13 ENCOUNTER — Other Ambulatory Visit: Payer: Self-pay | Admitting: Family Medicine

## 2015-07-13 DIAGNOSIS — I5032 Chronic diastolic (congestive) heart failure: Secondary | ICD-10-CM

## 2015-07-15 ENCOUNTER — Encounter: Payer: Self-pay | Admitting: Family Medicine

## 2015-07-16 MED ORDER — TRAZODONE HCL 50 MG PO TABS: 25.0000 mg | ORAL_TABLET | Freq: Every evening | ORAL | Status: DC | PRN

## 2015-07-24 ENCOUNTER — Telehealth: Payer: Self-pay

## 2015-07-24 MED ORDER — OMEPRAZOLE 40 MG PO CPDR: 40.0000 mg | DELAYED_RELEASE_CAPSULE | Freq: Every day | ORAL | Status: DC

## 2015-07-24 NOTE — Telephone Encounter (Signed)
Suanne Marker (DPR signed) left v/m; celexa was stopped and pt started taking trazodone; Suanne Marker wants to know if pt should take prilosec while taking trazodone. Pt is presently taking protonix but it is not as effective as prilosec and pt prefers to to take prilosec if will not interact with trazodone. Rhonda request cb.

## 2015-07-24 NOTE — Telephone Encounter (Signed)
Ok to switch back to prilosec - sent in.

## 2015-07-27 NOTE — Telephone Encounter (Signed)
Message left notifying patient's wife.

## 2015-08-05 ENCOUNTER — Telehealth: Payer: Self-pay

## 2015-08-05 DIAGNOSIS — I5032 Chronic diastolic (congestive) heart failure: Secondary | ICD-10-CM

## 2015-08-05 NOTE — Telephone Encounter (Signed)
Pt left /vm; since decreasing lasix pt having some wt gain(pt now weighs 3-5 lbs more and some swelling in legs;swelling goes down in legs at night while pt is sleeping. Presently taking lasix 40 mg once daily.  pt wants to know if should increase lasix. No SOB or CP. [facility]. Pt request cb on 08/06/15.

## 2015-08-06 NOTE — Telephone Encounter (Signed)
Pt notified of Dr. Synthia Innocent comment and verbalized understanding, pt will update [name] in the beginning of next week of the increase dose is helping or not

## 2015-08-06 NOTE — Telephone Encounter (Signed)
Let's increase lasix to 40mg  in am and 20mg  in early afternoon. Update [name] with effect. Ensure both doses cause increased urine output, if not may need to increase PM dose more.

## 2015-08-11 ENCOUNTER — Other Ambulatory Visit: Payer: Self-pay | Admitting: Internal Medicine

## 2015-08-11 ENCOUNTER — Other Ambulatory Visit: Payer: Self-pay | Admitting: Physician Assistant

## 2015-08-11 DIAGNOSIS — K7460 Unspecified cirrhosis of liver: Secondary | ICD-10-CM

## 2015-08-12 ENCOUNTER — Telehealth: Payer: Self-pay | Admitting: Family Medicine

## 2015-08-12 DIAGNOSIS — N289 Disorder of kidney and ureter, unspecified: Secondary | ICD-10-CM

## 2015-08-12 DIAGNOSIS — K7030 Alcoholic cirrhosis of liver without ascites: Secondary | ICD-10-CM

## 2015-08-12 NOTE — Telephone Encounter (Signed)
Pt called to let you know By added extra dosage of meds pt has lost 1 1/2 lbs since Friday

## 2015-08-13 ENCOUNTER — Ambulatory Visit: Payer: BLUE CROSS/BLUE SHIELD | Admitting: Cardiology

## 2015-08-16 NOTE — Addendum Note (Signed)
Addended by: Ria Bush on: 08/16/2015 11:01 PM   Modules accepted: Orders

## 2015-08-16 NOTE — Telephone Encounter (Addendum)
noted. Would continue current regimen. Would recommend come in this week to monitor kidneys on current regimen.

## 2015-08-17 NOTE — Telephone Encounter (Signed)
Patient notified.  Lab appointment scheduled for 2/21.

## 2015-08-18 ENCOUNTER — Other Ambulatory Visit (INDEPENDENT_AMBULATORY_CARE_PROVIDER_SITE_OTHER): Payer: BLUE CROSS/BLUE SHIELD

## 2015-08-18 DIAGNOSIS — K7030 Alcoholic cirrhosis of liver without ascites: Secondary | ICD-10-CM | POA: Diagnosis not present

## 2015-08-18 DIAGNOSIS — N289 Disorder of kidney and ureter, unspecified: Secondary | ICD-10-CM | POA: Diagnosis not present

## 2015-08-18 LAB — COMPREHENSIVE METABOLIC PANEL
ALBUMIN: 2.7 g/dL — AB (ref 3.5–5.2)
ALT: 26 U/L (ref 0–53)
AST: 49 U/L — AB (ref 0–37)
Alkaline Phosphatase: 174 U/L — ABNORMAL HIGH (ref 39–117)
BILIRUBIN TOTAL: 4 mg/dL — AB (ref 0.2–1.2)
BUN: 21 mg/dL (ref 6–23)
CHLORIDE: 104 meq/L (ref 96–112)
CO2: 25 meq/L (ref 19–32)
CREATININE: 1.59 mg/dL — AB (ref 0.40–1.50)
Calcium: 9.1 mg/dL (ref 8.4–10.5)
GFR: 49.45 mL/min — ABNORMAL LOW (ref >60.00)
Glucose, Bld: 97 mg/dL (ref 70–99)
Potassium: 4.9 mEq/L (ref 3.5–5.1)
SODIUM: 134 meq/L — AB (ref 135–145)
Total Protein: 7.4 g/dL (ref 6.0–8.3)

## 2015-08-20 ENCOUNTER — Ambulatory Visit
Admission: RE | Admit: 2015-08-20 | Discharge: 2015-08-20 | Disposition: A | Discharge status: Home / Self Care | Payer: BLUE CROSS/BLUE SHIELD | Source: Ambulatory Visit | Attending: Physician Assistant | Admitting: Physician Assistant

## 2015-08-20 DIAGNOSIS — K7460 Unspecified cirrhosis of liver: Secondary | ICD-10-CM

## 2015-09-05 ENCOUNTER — Other Ambulatory Visit: Payer: Self-pay | Admitting: Family Medicine

## 2015-09-05 DIAGNOSIS — I5032 Chronic diastolic (congestive) heart failure: Secondary | ICD-10-CM

## 2015-09-05 DIAGNOSIS — D696 Thrombocytopenia, unspecified: Secondary | ICD-10-CM

## 2015-09-05 DIAGNOSIS — E785 Hyperlipidemia, unspecified: Secondary | ICD-10-CM

## 2015-09-05 DIAGNOSIS — D61818 Other pancytopenia: Secondary | ICD-10-CM

## 2015-09-05 DIAGNOSIS — K7030 Alcoholic cirrhosis of liver without ascites: Secondary | ICD-10-CM

## 2015-09-07 ENCOUNTER — Other Ambulatory Visit (INDEPENDENT_AMBULATORY_CARE_PROVIDER_SITE_OTHER): Payer: BLUE CROSS/BLUE SHIELD

## 2015-09-07 DIAGNOSIS — E785 Hyperlipidemia, unspecified: Secondary | ICD-10-CM

## 2015-09-07 DIAGNOSIS — D696 Thrombocytopenia, unspecified: Secondary | ICD-10-CM | POA: Diagnosis not present

## 2015-09-07 DIAGNOSIS — K7030 Alcoholic cirrhosis of liver without ascites: Secondary | ICD-10-CM | POA: Diagnosis not present

## 2015-09-07 DIAGNOSIS — D61818 Other pancytopenia: Secondary | ICD-10-CM | POA: Diagnosis not present

## 2015-09-07 LAB — CBC WITH DIFFERENTIAL/PLATELET
BASOS ABS: 0 10*3/uL (ref 0.0–0.1)
Basophils Relative: 0.8 % (ref 0.0–3.0)
Eosinophils Absolute: 0.2 10*3/uL (ref 0.0–0.7)
Eosinophils Relative: 4.8 % (ref 0.0–5.0)
HCT: 29.3 % — ABNORMAL LOW (ref 39.0–52.0)
HEMOGLOBIN: 10.1 g/dL — AB (ref 13.0–17.0)
LYMPHS ABS: 0.8 10*3/uL (ref 0.7–4.0)
Lymphocytes Relative: 21.2 % (ref 12.0–46.0)
MCHC: 34.7 g/dL (ref 30.0–36.0)
MCV: 101.1 fl — AB (ref 78.0–100.0)
MONOS PCT: 7.9 % (ref 3.0–12.0)
Monocytes Absolute: 0.3 10*3/uL (ref 0.1–1.0)
NEUTROS PCT: 65.3 % (ref 43.0–77.0)
Neutro Abs: 2.4 10*3/uL (ref 1.4–7.7)
Platelets: 62 10*3/uL — ABNORMAL LOW (ref 150.0–400.0)
RBC: 2.89 Mil/uL — AB (ref 4.22–5.81)
RDW: 16.3 % — ABNORMAL HIGH (ref 11.5–15.5)
WBC: 3.7 10*3/uL — AB (ref 4.0–10.5)

## 2015-09-07 LAB — LIPID PANEL
CHOLESTEROL: 179 mg/dL (ref 0–200)
HDL: 37.1 mg/dL — AB (ref >39.00)
LDL Cholesterol: 116 mg/dL — ABNORMAL HIGH (ref 0–99)
NonHDL: 141.71
TRIGLYCERIDES: 130 mg/dL (ref 0.0–149.0)
Total CHOL/HDL Ratio: 5
VLDL: 26 mg/dL (ref 0.0–40.0)

## 2015-09-07 LAB — COMPREHENSIVE METABOLIC PANEL
ALBUMIN: 2.5 g/dL — AB (ref 3.5–5.2)
ALK PHOS: 169 U/L — AB (ref 39–117)
ALT: 29 U/L (ref 0–53)
AST: 51 U/L — ABNORMAL HIGH (ref 0–37)
BILIRUBIN TOTAL: 3.9 mg/dL — AB (ref 0.2–1.2)
BUN: 20 mg/dL (ref 6–23)
CALCIUM: 8.7 mg/dL (ref 8.4–10.5)
CO2: 24 mEq/L (ref 19–32)
Chloride: 104 mEq/L (ref 96–112)
Creatinine, Ser: 1.63 mg/dL — ABNORMAL HIGH (ref 0.40–1.50)
GFR: 48.04 mL/min — AB (ref >60.00)
GLUCOSE: 118 mg/dL — AB (ref 70–99)
POTASSIUM: 4.8 meq/L (ref 3.5–5.1)
Sodium: 133 mEq/L — ABNORMAL LOW (ref 135–145)
TOTAL PROTEIN: 7 g/dL (ref 6.0–8.3)

## 2015-09-07 LAB — PROTIME-INR
INR: 1.6 ratio — AB (ref 0.8–1.0)
Prothrombin Time: 17.1 s — ABNORMAL HIGH (ref 9.6–13.1)

## 2015-09-07 LAB — MAGNESIUM: MAGNESIUM: 1.8 mg/dL (ref 1.5–2.5)

## 2015-09-14 ENCOUNTER — Encounter: Payer: BLUE CROSS/BLUE SHIELD | Admitting: Family Medicine

## 2015-09-19 ENCOUNTER — Other Ambulatory Visit: Payer: Self-pay | Admitting: Family Medicine

## 2015-09-21 ENCOUNTER — Encounter: Payer: Self-pay | Admitting: Family Medicine

## 2015-09-21 ENCOUNTER — Ambulatory Visit (INDEPENDENT_AMBULATORY_CARE_PROVIDER_SITE_OTHER): Payer: BLUE CROSS/BLUE SHIELD | Admitting: Family Medicine

## 2015-09-21 VITALS — BP 124/68 | HR 76 | Temp 98.4°F | Wt 269.5 lb

## 2015-09-21 DIAGNOSIS — G4733 Obstructive sleep apnea (adult) (pediatric): Secondary | ICD-10-CM

## 2015-09-21 DIAGNOSIS — I10 Essential (primary) hypertension: Secondary | ICD-10-CM

## 2015-09-21 DIAGNOSIS — K7030 Alcoholic cirrhosis of liver without ascites: Secondary | ICD-10-CM

## 2015-09-21 DIAGNOSIS — E785 Hyperlipidemia, unspecified: Secondary | ICD-10-CM

## 2015-09-21 DIAGNOSIS — K7290 Hepatic failure, unspecified without coma: Secondary | ICD-10-CM

## 2015-09-21 DIAGNOSIS — D61818 Other pancytopenia: Secondary | ICD-10-CM

## 2015-09-21 DIAGNOSIS — R600 Localized edema: Secondary | ICD-10-CM | POA: Diagnosis not present

## 2015-09-21 DIAGNOSIS — Z6841 Body Mass Index (BMI) 40.0 and over, adult: Secondary | ICD-10-CM

## 2015-09-21 DIAGNOSIS — Z0000 Encounter for general adult medical examination without abnormal findings: Secondary | ICD-10-CM | POA: Diagnosis not present

## 2015-09-21 DIAGNOSIS — F1021 Alcohol dependence, in remission: Secondary | ICD-10-CM

## 2015-09-21 DIAGNOSIS — K7682 Hepatic encephalopathy: Secondary | ICD-10-CM

## 2015-09-21 DIAGNOSIS — I5032 Chronic diastolic (congestive) heart failure: Secondary | ICD-10-CM

## 2015-09-21 DIAGNOSIS — Z9989 Dependence on other enabling machines and devices: Secondary | ICD-10-CM

## 2015-09-21 DIAGNOSIS — D696 Thrombocytopenia, unspecified: Secondary | ICD-10-CM

## 2015-09-21 MED ORDER — POTASSIUM CHLORIDE ER 10 MEQ PO TBCR: 10.0000 meq | EXTENDED_RELEASE_TABLET | Freq: Every day | ORAL | Status: DC

## 2015-09-21 MED ORDER — PHYTONADIONE 5 MG PO TABS: 10.0000 mg | ORAL_TABLET | Freq: Every day | ORAL | Status: DC

## 2015-09-21 NOTE — Assessment & Plan Note (Signed)
Followed by cardiology 

## 2015-09-21 NOTE — Assessment & Plan Note (Signed)
Continue lasix and spironolactone

## 2015-09-21 NOTE — Assessment & Plan Note (Signed)
Reports compliance with CPAP.

## 2015-09-21 NOTE — Assessment & Plan Note (Signed)
Preventative protocols reviewed and updated unless pt declined. Discussed healthy diet and lifestyle.  

## 2015-09-21 NOTE — Patient Instructions (Addendum)
Decrease lactulose to 20gm daily (71mL).  Work on a low salt diet - look into salt alternatives (Mrs Deliah Boston).  Good to see you today. Call us with questions. Return in 3 months for follow up visit, prior for labs (nonfasting).   Health Maintenance, Male A healthy lifestyle and preventative care can promote health and wellness.  Maintain regular health, dental, and eye exams.  Eat a healthy diet. Foods like vegetables, fruits, whole grains, low-fat dairy products, and lean protein foods contain the nutrients you need and are low in calories. Decrease your intake of foods high in solid fats, added sugars, and salt. Get information about a proper diet from your health care provider, if necessary.  Regular physical exercise is one of the most important things you can do for your health. Most adults should get at least 150 minutes of moderate-intensity exercise (any activity that increases your heart rate and causes you to sweat) each week. In addition, most adults need muscle-strengthening exercises on 2 or more days a week.   Maintain a healthy weight. The body mass index (BMI) is a screening tool to identify possible weight problems. It provides an estimate of body fat based on height and weight. Your health care provider can find your BMI and can help you achieve or maintain a healthy weight. For males 20 years and older:  A BMI below 18.5 is considered underweight.  A BMI of 18.5 to 24.9 is normal.  A BMI of 25 to 29.9 is considered overweight.  A BMI of 30 and above is considered obese.  Maintain normal blood lipids and cholesterol by exercising and minimizing your intake of saturated fat. Eat a balanced diet with plenty of fruits and vegetables. Blood tests for lipids and cholesterol should begin at age 56 and be repeated every 5 years. If your lipid or cholesterol levels are high, you are over age 11, or you are at high risk for heart disease, you may need your cholesterol levels checked  more frequently.Ongoing high lipid and cholesterol levels should be treated with medicines if diet and exercise are not working.  If you smoke, find out from your health care provider how to quit. If you do not use tobacco, do not start.  Lung cancer screening is recommended for adults aged 21-80 years who are at high risk for developing lung cancer because of a history of smoking. A yearly low-dose CT scan of the lungs is recommended for people who have at least a 30-pack-year history of smoking and are current smokers or have quit within the past 15 years. A pack year of smoking is smoking an average of 1 pack of cigarettes a day for 1 year (for example, a 30-pack-year history of smoking could mean smoking 1 pack a day for 30 years or 2 packs a day for 15 years). Yearly screening should continue until the smoker has stopped smoking for at least 15 years. Yearly screening should be stopped for people who develop a health problem that would prevent them from having lung cancer treatment.  If you choose to drink alcohol, do not have more than 2 drinks per day. One drink is considered to be 12 oz (360 mL) of beer, 5 oz (150 mL) of wine, or 1.5 oz (45 mL) of liquor.  Avoid the use of street drugs. Do not share needles with anyone. Ask for help if you need support or instructions about stopping the use of drugs.  High blood pressure causes heart disease and  increases the risk of stroke. High blood pressure is more likely to develop in:  People who have blood pressure in the end of the normal range (100-139/85-89 mm Hg).  People who are overweight or obese.  People who are African American.  If you are 67-67 years of age, have your blood pressure checked every 3-5 years. If you are 65 years of age or older, have your blood pressure checked every year. You should have your blood pressure measured twice--once when you are at a hospital or clinic, and once when you are not at a hospital or clinic. Record  the average of the two measurements. To check your blood pressure when you are not at a hospital or clinic, you can use:  An automated blood pressure machine at a pharmacy.  A home blood pressure monitor.  If you are 80-63 years old, ask your health care provider if you should take aspirin to prevent heart disease.  Diabetes screening involves taking a blood sample to check your fasting blood sugar level. This should be done once every 3 years after age 51 if you are at a normal weight and without risk factors for diabetes. Testing should be considered at a younger age or be carried out more frequently if you are overweight and have at least 1 risk factor for diabetes.  Colorectal cancer can be detected and often prevented. Most routine colorectal cancer screening begins at the age of 22 and continues through age 94. However, your health care provider may recommend screening at an earlier age if you have risk factors for colon cancer. On a yearly basis, your health care provider may provide home test kits to check for hidden blood in the stool. A small camera at the end of a tube may be used to directly examine the colon (sigmoidoscopy or colonoscopy) to detect the earliest forms of colorectal cancer. Talk to your health care provider about this at age 15 when routine screening begins. A direct exam of the colon should be repeated every 5-10 years through age 34, unless early forms of precancerous polyps or small growths are found.  People who are at an increased risk for hepatitis B should be screened for this virus. You are considered at high risk for hepatitis B if:  You were born in a country where hepatitis B occurs often. Talk with your health care provider about which countries are considered high risk.  Your parents were born in a high-risk country and you have not received a shot to protect against hepatitis B (hepatitis B vaccine).  You have HIV or AIDS.  You use needles to inject  street drugs.  You live with, or have sex with, someone who has hepatitis B.  You are a man who has sex with other men (MSM).  You get hemodialysis treatment.  You take certain medicines for conditions like cancer, organ transplantation, and autoimmune conditions.  Hepatitis C blood testing is recommended for all people born from [birth year] through 1964 and any individual with known risk factors for hepatitis C.  Healthy men should no longer receive prostate-specific antigen (PSA) blood tests as part of routine cancer screening. Talk to your health care provider about prostate cancer screening.  Testicular cancer screening is not recommended for adolescents or adult males who have no symptoms. Screening includes self-exam, a health care provider exam, and other screening tests. Consult with your health care provider about any symptoms you have or any concerns you have about testicular cancer.  Practice safe sex. Use condoms and avoid high-risk sexual practices to reduce the spread of sexually transmitted infections (STIs).  You should be screened for STIs, including gonorrhea and chlamydia if:  You are sexually active and are younger than 24 years.  You are older than 24 years, and your health care provider tells you that you are at risk for this type of infection.  Your sexual activity has changed since you were last screened, and you are at an increased risk for chlamydia or gonorrhea. Ask your health care provider if you are at risk.  If you are at risk of being infected with HIV, it is recommended that you take a prescription medicine daily to prevent HIV infection. This is called pre-exposure prophylaxis (PrEP). You are considered at risk if:  You are a man who has sex with other men (MSM).  You are a heterosexual man who is sexually active with multiple partners.  You take drugs by injection.  You are sexually active with a partner who has HIV.  Talk with your health care provider  about whether you are at high risk of being infected with HIV. If you choose to begin PrEP, you should first be tested for HIV. You should then be tested every 3 months for as long as you are taking PrEP.  Use sunscreen. Apply sunscreen liberally and repeatedly throughout the day. You should seek shade when your shadow is shorter than you. Protect yourself by wearing long sleeves, pants, a wide-brimmed hat, and sunglasses year round whenever you are outdoors.  Tell your health care provider of new moles or changes in moles, especially if there is a change in shape or color. Also, tell your health care provider if a mole is larger than the size of a pencil eraser.  A one-time screening for abdominal aortic aneurysm (AAA) and surgical repair of large AAAs by ultrasound is recommended for men aged 23-75 years who are current or former smokers.  Stay current with your vaccines (immunizations).   This information is not intended to replace advice given to you by your health care provider. Make sure you discuss any questions you have with your health care provider.   Document Released: 12/10/2007 Document Revised: 07/04/2014 Document Reviewed: 11/08/2010 Elsevier Interactive Patient Education [employer].

## 2015-09-21 NOTE — Assessment & Plan Note (Signed)
Discussed healthy diet and lifestyle changes for sustainable weight loss. 

## 2015-09-21 NOTE — Assessment & Plan Note (Signed)
Last drink 09/2014.

## 2015-09-21 NOTE — Assessment & Plan Note (Signed)
Continues lactulose.  Endorses diarrhea 4-6 x/day therefore will decrease lactulose to 20gm daily.

## 2015-09-21 NOTE — Assessment & Plan Note (Signed)
Reviewed red flags to seek urgent care.

## 2015-09-21 NOTE — Assessment & Plan Note (Signed)
Reviewed labwork with patient.  MELD score 24.  Has 3 more weeks of substance abuse counseling sessions then will return to transplant center in [place].

## 2015-09-21 NOTE — Assessment & Plan Note (Signed)
Stable

## 2015-09-21 NOTE — Assessment & Plan Note (Signed)
Chronic, stable. Continue current regimen. 

## 2015-09-21 NOTE — Progress Notes (Signed)
Pre visit review using our clinic review tool, if applicable. No additional management support is needed unless otherwise documented below in the visit note. 

## 2015-09-21 NOTE — Progress Notes (Signed)
BP 124/68 mmHg  Pulse 76  Temp(Src) 98.4 F (36.9 C) (Oral)  Wt 269 lb 8 oz (122.244 kg)   CC: CPE  Subjective:    Patient ID: Colton Porter, male    DOB: 01/29/1966, 50 y.o.   MRN: [id number]  HPI: Colton Porter is a 50 y.o. male presenting on 09/21/2015 for Annual Exam   Currently completing substance abuse classes.  Occasional R sided abd discomfort over last 4-5 days.  Latest MELD 24. Stooling 4-6 x/day. Taking lactulose 30gm daily.  Compliant with CPAP for OSA.   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 No changing moles on skin.   Lives with wife Occupation: full disability after cirrhosis dx, some working  Activity: some yardwork Diet: good water, good fruit intake, lots of red meats  Relevant past medical, surgical, family and social history reviewed and updated as indicated. Interim medical history since our last visit reviewed. Allergies and medications reviewed and updated. Current Outpatient Prescriptions on File Prior to Visit  Medication Sig  . cetirizine (ZYRTEC) 10 MG tablet TAKE 1 TABLET BY MOUTH EVERY DAY  . fluticasone (FLONASE) 50 MCG/ACT nasal spray Place 2 sprays into both nostrils daily.  . furosemide (LASIX) 40 MG tablet Take 40mg  in am and 20mg  in PM  . magnesium oxide (MAG-OX) 400 (241.3 MG) MG tablet Take 1 tablet (400 mg total) by mouth daily.  Marland Kitchen omeprazole (PRILOSEC) 40 MG capsule Take 1 capsule (40 mg total) by mouth daily.  . rizatriptan (MAXALT) 5 MG tablet Take 5 mg by mouth as needed for migraine (may repeat in 2 hours if needed for migraine relief). May repeat in 2 hours if needed  . spironolactone (ALDACTONE) 25 MG tablet Take 2 tablets (50 mg total) by mouth 2 (two) times daily.  . traMADol  (ULTRAM) 50 MG tablet Take 50 mg by mouth every 12 (twelve) hours as needed for moderate pain.   No current facility-administered medications on file prior to visit.    Review of Systems  Constitutional: Negative for fever, chills, activity change, appetite change, fatigue and unexpected weight change.  HENT: Negative for hearing loss.   Eyes: Negative for visual disturbance.  Respiratory: Positive for shortness of breath. Negative for cough, chest tightness and wheezing.   Cardiovascular: Positive for chest pain and leg swelling. Negative for palpitations.  Gastrointestinal: Positive for abdominal pain and diarrhea. Negative for nausea, vomiting, constipation, blood in stool and abdominal distention.  Genitourinary: Negative for hematuria and difficulty urinating.  Musculoskeletal: Negative for myalgias, arthralgias and neck pain.  Skin: Negative for rash.  Neurological: Positive for headaches. Negative for dizziness, seizures and syncope.  Hematological: Negative for adenopathy. Bruises/bleeds easily.  Psychiatric/Behavioral: Negative for dysphoric mood. The patient is not nervous/anxious.    Per HPI unless specifically indicated in ROS section     Objective:    BP 124/68 mmHg  Pulse 76  Temp(Src) 98.4 F (36.9 C) (Oral)  Wt 269 lb 8 oz (122.244 kg)  Wt Readings from Last 3 Encounters:  09/21/15 269 lb 8 oz (122.244 kg)  07/09/15 269 lb (122.018 kg)  07/08/15 266 lb 12.8 oz (121.02 kg)   Body mass index is 42.2 kg/(m^2).  Physical Exam  Constitutional: He is oriented to person, place, and time. He appears well-developed and well-nourished. No distress.  HENT:  Head: Normocephalic and atraumatic.  Right Ear: Hearing, tympanic membrane, external ear and ear canal normal.  Left Ear: Hearing, tympanic membrane, external ear and ear canal normal.  Nose: Nose normal.  Mouth/Throat: Uvula is midline, oropharynx is clear and moist and mucous membranes are normal. No oropharyngeal  exudate, posterior oropharyngeal edema or posterior oropharyngeal erythema.  Eyes: Conjunctivae and EOM are normal. Pupils are equal, round, and reactive to light. Scleral icterus is present.  Neck: Normal range of motion. Neck supple. No thyromegaly present.  Cardiovascular: Normal rate, regular rhythm, normal heart sounds and intact distal pulses.   No murmur heard. Pulses:      Radial pulses are 2+ on the right side, and 2+ on the left side.  Pulmonary/Chest: Effort normal and breath sounds normal. No respiratory distress. He has no wheezes. He has no rales.  Abdominal: Soft. Normal appearance and bowel sounds are normal. He exhibits no distension, no fluid wave, no ascites (not appreciated) and no mass. There is no hepatosplenomegaly. There is no tenderness. There is no rigidity, no rebound, no guarding, no CVA tenderness and negative Murphy's sign.  obese  Musculoskeletal: Normal range of motion. He exhibits edema (1+ pitting bilaterally).  Lymphadenopathy:    He has no cervical adenopathy.  Neurological: He is alert and oriented to person, place, and time.  CN grossly intact, station and gait intact  Skin: Skin is warm and dry. No rash noted.  jaundiced  Psychiatric: He has a normal mood and affect. His behavior is normal. Judgment and thought content normal.  Nursing note and vitals reviewed.  Results for orders placed or performed in visit on 09/07/15  Lipid panel  Result Value Ref Range   Cholesterol 179 0 - 200 mg/dL   Triglycerides 130.0 0.0 - 149.0 mg/dL   HDL 37.10 (L) >39.00 mg/dL   VLDL 26.0 0.0 - 40.0 mg/dL   LDL Cholesterol 116 (H) 0 - 99 mg/dL   Total CHOL/HDL Ratio 5    NonHDL 141.71   Comprehensive metabolic panel  Result Value Ref Range   Sodium 133 (L) 135 - 145 mEq/L   Potassium 4.8 3.5 - 5.1 mEq/L   Chloride 104 96 - 112 mEq/L   CO2 24 19 - 32 mEq/L   Glucose, Bld 118 (H) 70 - 99 mg/dL   BUN 20 6 - 23 mg/dL   Creatinine, Ser 1.63 (H) 0.40 - 1.50 mg/dL    Total Bilirubin 3.9 (H) 0.2 - 1.2 mg/dL   Alkaline Phosphatase 169 (H) 39 - 117 U/L   AST 51 (H) 0 - 37 U/L   ALT 29 0 - 53 U/L   Total Protein 7.0 6.0 - 8.3 g/dL   Albumin 2.5 (L) 3.5 - 5.2 g/dL   Calcium 8.7 8.4 - 10.5 mg/dL   GFR 48.04 (L) >60.00 mL/min  CBC with Differential/Platelet  Result Value Ref Range   WBC 3.7 (L) 4.0 - 10.5 K/uL   RBC 2.89 (L) 4.22 - 5.81 Mil/uL   Hemoglobin 10.1 (L) 13.0 - 17.0 g/dL   HCT 29.3 (L) 39.0 - 52.0 %   MCV 101.1 (H) 78.0 - 100.0 fl   MCHC 34.7 30.0 - 36.0 g/dL   RDW 16.3 (H) 11.5 - 15.5 %   Platelets 62.0 (L) 150.0 - 400.0 K/uL   Neutrophils Relative % 65.3 43.0 - 77.0 %   Lymphocytes Relative 21.2 12.0 - 46.0 %   Monocytes Relative 7.9 3.0 - 12.0 %   Eosinophils Relative 4.8 0.0 -  5.0 %   Basophils Relative 0.8 0.0 - 3.0 %   Neutro Abs 2.4 1.4 - 7.7 K/uL   Lymphs Abs 0.8 0.7 - 4.0 K/uL   Monocytes Absolute 0.3 0.1 - 1.0 K/uL   Eosinophils Absolute 0.2 0.0 - 0.7 K/uL   Basophils Absolute 0.0 0.0 - 0.1 K/uL  Protime-INR  Result Value Ref Range   INR 1.6 (H) 0.8 - 1.0 ratio   Prothrombin Time 17.1 (H) 9.6 - 13.1 sec  Magnesium  Result Value Ref Range   Magnesium 1.8 1.5 - 2.5 mg/dL      Assessment & Plan:   Problem List Items Addressed This Visit    Thrombocytopenia ([place])    Stable.      Routine general medical examination at a health care facility - Primary    Preventative protocols reviewed and updated unless pt declined. Discussed healthy diet and lifestyle.       Pedal edema    Continue lasix and spironolactone      Pancytopenia, acquired ([place])    Reviewed red flags to seek urgent care.      OSA on CPAP    Reports compliance with CPAP      Morbid obesity with BMI of 40.0-44.9, adult (H[facility])    Discussed healthy diet and lifestyle changes for sustainable weight loss      HLD (hyperlipidemia)    Improving #s off meds. Continue to monitor.      History of alcohol dependence ([place])    Last drink 09/2014.       Hepatic encephalopathy ([facility])    Continues lactulose.  Endorses diarrhea 4-6 x/day therefore will decrease lactulose to 20gm daily.       Essential hypertension    Chronic, stable. Continue current regimen.      Chronic diastolic heart failure ([place])    Followed by cardiology.      Alcoholic cirrhosis of liver without ascites ([place])    Reviewed labwork with patient.  MELD score 24.  Has 3 more weeks of substance abuse counseling sessions then will return to transplant center in [place].          Follow up plan: Return in about 3 months (around 12/22/2015), or as needed, for follow up visit.

## 2015-09-21 NOTE — Assessment & Plan Note (Signed)
Improving #s off meds. Continue to monitor.

## 2015-10-13 ENCOUNTER — Telehealth: Payer: Self-pay | Admitting: Family Medicine

## 2015-10-13 ENCOUNTER — Encounter: Payer: Self-pay | Admitting: Family Medicine

## 2015-10-13 DIAGNOSIS — I5032 Chronic diastolic (congestive) heart failure: Secondary | ICD-10-CM

## 2015-10-13 MED ORDER — LACTULOSE 10 GM/15ML PO SOLN: 20.0000 g | Freq: Every day | ORAL | Status: DC

## 2015-10-13 MED ORDER — FUROSEMIDE 40 MG PO TABS: ORAL_TABLET | ORAL | Status: DC

## 2015-10-13 NOTE — Addendum Note (Signed)
Addended by: Royann Shivers A on: 10/13/2015 10:46 AM   Modules accepted: Orders

## 2015-10-14 ENCOUNTER — Telehealth: Payer: Self-pay | Admitting: Family Medicine

## 2015-10-14 ENCOUNTER — Ambulatory Visit: Payer: BLUE CROSS/BLUE SHIELD | Admitting: Primary Care

## 2015-10-14 NOTE — Telephone Encounter (Signed)
°  °  °     °    °  °  °   °   °   °   °   °   °   °   °   °  °     °     °     °    °    °   ° °  Patient Name: Colton Porter  DOB: 22-Nov-1965    Initial Comment Caller states her brother is having back pain, fever, chills, and he's on a Liver Transplant list.    Nurse Assessment  Nurse: Raphael Gibney, RN, Vanita Ingles Date/Time (Eastern Time): 10/14/2015 8:30:08 AM  Confirm and document reason for call. If symptomatic, describe symptoms. You must click the next button to save text entered. ---Caller states he has pain in his lower back. Temp 99.9 last night. Vomited x 1 last night that was green. Having flank pain on both sides. He is in the process of liver transplant list. Not urinating much. Having a lot of edema to his knees. Pain level 5-6 while laying down.  Has the patient traveled out of the country within the last 30 days? ---Not Applicable  Does the patient have any new or worsening symptoms? ---Yes  Will a triage be completed? ---Yes  Related visit to physician within the last 2 weeks? ---No  Does the PT have any chronic conditions? (i.e. diabetes, asthma, etc.) ---Yes  List chronic conditions. ---liver disease  Is this a behavioral health or substance abuse call? ---No     Guidelines    Guideline Title Affirmed Question Affirmed Notes  Flank Pain Vomiting    Final Disposition User   Go to ED Now (or PCP triage) Raphael Gibney, RN, [place]    Comments  Sister reports he has appt at 3:45 pm today but does not remember who it is with. Called back line and spoke to [place] and reported that pt is having flank pain with ER outcome (or PCP triage) and no appts are available within 4 hrs. Sister Lelon Frohlich wants to know about bringing in urine specimen prior to appt. Please call her back at [phone] and left her know about urine specimen.   Referrals  GO TO FACILITY REFUSED   Disagree/Comply: Comply

## 2015-10-14 NOTE — Telephone Encounter (Signed)
Noted. I would prefer to have a clean specimen obtained in our clinic later today during his appointment. Please have him drink water and not to urinate prior to his appointment today so we can obtain a sample.

## 2015-10-14 NOTE — Telephone Encounter (Signed)
Patient's sister,Ann, called to let Dr.Gutierrez know patient is being sent to [facility] to see the liver team.  They think he may be dehydrated.  Ann said the liver team should keep Dr.Gutierrez informed.

## 2015-10-14 NOTE — Telephone Encounter (Signed)
Pt has appt with Allie Bossier NP 10/14/15 at 3:45 pm. pts sister wants to know if should bring in urine specimen prior to appt; see TH note.

## 2015-10-14 NOTE — Telephone Encounter (Signed)
Pt notified as instructed and pt voiced understanding. 

## 2015-10-14 NOTE — Telephone Encounter (Signed)
PLEASE NOTE: All timestamps contained within this report are represented as [place] Standard Time. CONFIDENTIALTY NOTICE: This fax transmission is intended only for the addressee. It contains information that is legally privileged, confidential or otherwise protected from use or disclosure. If you are not the intended recipient, you are strictly prohibited from reviewing, disclosing, copying using or disseminating any of this information or taking any action in reliance on or regarding this information. If you have received this fax in error, please notify us immediately by telephone so that we can arrange for its return to [name]. Phone: [phone], Toll-Free: [phone], Fax: [phone] Page: 1 of 2 Call Id: [id number] [place] Patient Name: Colton Porter Gender: Male DOB: 08/03/1965 Age: 50 Y 71 D Return Phone Number: [phone] (Primary), [phone] (Secondary) Address: City/State/Zip: [place] - Client Client Site [place] Physician Ria Bush Contact Type Call Who Is Calling Patient / Member / Family / Caregiver Call Type Triage / Clinical Caller Name Lelon Frohlich Relationship To Patient Other relative Return Phone Number [phone] (Secondary) Chief Complaint Back Pain - General Reason for Call Symptomatic / Request for [place] states her brother is having back pain, fever, chills, and he's on a Liver Transplant list. Appointment Disposition EMR Appointment Attempted - Not Scheduled Info pasted into Epic Yes PreDisposition Call Doctor Translation No Nurse Assessment Nurse: Raphael Gibney, RN, Vanita Ingles Date/Time (Eastern Time): 10/14/2015 8:30:08 AM Confirm and document reason for call. If symptomatic, describe symptoms. You must click the next button to save text entered. ---Caller  states he has pain in his lower back. Temp 99.9 last night. Vomited x 1 last night that was green. Having flank pain on both sides. He is in the process of liver transplant list. Not urinating much. Having a lot of edema to his knees. Pain level 5-6 while laying down. Has the patient traveled out of the country within the last 30 days? ---Not Applicable Does the patient have any new or worsening symptoms? ---Yes Will a triage be completed? ---Yes Related visit to physician within the last 2 weeks? ---No Does the PT have any chronic conditions? (i.e. diabetes, asthma, etc.) ---Yes List chronic conditions. ---liver disease Is this a behavioral health or substance abuse call? ---No Guidelines Guideline Title Affirmed Question Affirmed Notes Nurse Date/Time Eilene Ghazi Time) Flank Pain Vomiting Raphael Gibney, RN, Vanita Ingles 10/14/2015 8:41:29 AM Disp. Time Eilene Ghazi Time) Disposition Final User PLEASE NOTE: All timestamps contained within this report are represented as [place] Standard Time. CONFIDENTIALTY NOTICE: This fax transmission is intended only for the addressee. It contains information that is legally privileged, confidential or otherwise protected from use or disclosure. If you are not the intended recipient, you are strictly prohibited from reviewing, disclosing, copying using or disseminating any of this information or taking any action in reliance on or regarding this information. If you have received this fax in error, please notify us immediately by telephone so that we can arrange for its return to [name]. Phone: [phone], Toll-Free: [phone], Fax: [phone] Page: 2 of 2 Call Id: [id number] 10/14/2015 8:51:07 AM Go to ED Now (or PCP triage) Yes Raphael Gibney, RN, Doreatha Lew Understands: Yes Disagree/Comply: Comply Care Advice Given Per Guideline GO TO ED NOW (OR PCP TRIAGE): DRIVING: Another adult should drive. CARE ADVICE given per Flank Pain (Adult) guideline. Comments User: Dannielle Burn, RN Date/Time Eilene Ghazi Time): 10/14/2015 8:55:52 AM  Sister reports he has appt at 3:45 pm today but does not remember who it is with. Called back line and spoke to Damar and reported that pt is having flank pain with ER outcome (or PCP triage) and no appts are available within 4 hrs. Sister Lelon Frohlich wants to know about bringing in urine specimen prior to appt. Please call her back at [phone] and left her know about urine specimen. Referrals GO TO FACILITY REFUSED

## 2015-10-14 NOTE — Telephone Encounter (Signed)
Noted. Thank.

## 2015-10-17 NOTE — Telephone Encounter (Addendum)
Received [facility] ER notice about pt seen for back pain and flu like sxs. Discharged home.  plz call for f/u ensure flu like sxs have resolved.

## 2015-10-20 NOTE — Telephone Encounter (Signed)
Left message to return call 

## 2015-10-20 NOTE — Telephone Encounter (Signed)
Message left for patient to return my call.  

## 2015-10-20 NOTE — Telephone Encounter (Signed)
Spoke with patient. He said he is feeling better from the flu stand point, but thinks he may be getting a sinus infection. He is going to give it a few days and see how he feels. If he feels bad towards the end of the week, he will schedule appt.

## 2015-10-22 ENCOUNTER — Ambulatory Visit (INDEPENDENT_AMBULATORY_CARE_PROVIDER_SITE_OTHER): Payer: BLUE CROSS/BLUE SHIELD | Admitting: Internal Medicine

## 2015-10-22 ENCOUNTER — Encounter: Payer: Self-pay | Admitting: Internal Medicine

## 2015-10-22 VITALS — BP 128/68 | HR 88 | Temp 98.2°F | Wt 274.0 lb

## 2015-10-22 DIAGNOSIS — N182 Chronic kidney disease, stage 2 (mild): Secondary | ICD-10-CM

## 2015-10-22 DIAGNOSIS — K7030 Alcoholic cirrhosis of liver without ascites: Secondary | ICD-10-CM | POA: Diagnosis not present

## 2015-10-22 DIAGNOSIS — R252 Cramp and spasm: Secondary | ICD-10-CM | POA: Diagnosis not present

## 2015-10-22 DIAGNOSIS — I5032 Chronic diastolic (congestive) heart failure: Secondary | ICD-10-CM | POA: Diagnosis not present

## 2015-10-22 DIAGNOSIS — J309 Allergic rhinitis, unspecified: Secondary | ICD-10-CM

## 2015-10-22 NOTE — Progress Notes (Signed)
HPI  Pt presents to the clinic today with c/o headache, facial pain and pressure, nasal congestion, ear fullness and teeth pain. This started 1 week ago. He is blowing clear mucous out of his nose. He has tried Ibuprofen with some relief. He does have a history of seasonal allergies, and occasionally takes Flonase and Claritin as needed, but not lately. He has not had sick contacts that he is aware of.  He also reports muscles cramps in his hands, calves and feet. This is has been intermittent over the last few months. It seems worse if he has been standing on his feet during the day. His symptoms seem worse at night. The pain keeps him up, and often he has to get up and walk to relieve the pain. He is on Lasix and Sprionolactone, but has been taking his Magnesium and Potassium supplement as prescribed. Labs from 08/2015 reviewed, normal potassium and magnesium.   He has concerns about significant weight gain. His wife reports he has gained 6 lbs over the last week. He denies chest tightness or shortness of breath. He has a history of diastolic CHF. He takes Lasix and Spironolactone, and reports it works better some days than others. He does not feel like he urinates enough. He does not weight daily, and he does not have any prn dose of his diuretic to take for excessive weight gain. He does try to consume a low salt diet. Echo from 11/2014 reviewed, EF 60-65%.  He also wants to discuss how he can get treatment for his alcoholic cirrhosis transferred from [place] to [facility]. He feels like they are making him jump through hoops in order to get on the transplant list. He is tired of driving back and forth to [place]. He reports he was referred there by Dr. Ardis Hughs.  Review of System    Past Medical History  Diagnosis Date  . Allergy   . GERD (gastroesophageal reflux disease)   . Hyperlipidemia   . Hypertension   . Neuropathy ([place])   . Thrombocytopenia ([place]) 12/15/2011  . Chronic diastolic heart  failure ([place]) 11/01/2013  . Alcohol dependence ([place]) 07/11/2012  . Alcoholic cirrhosis of liver with ascites ([place]) 07/2014  . OSA on CPAP 07/11/2012    HST 07/2013:  AHI 39/hr.    . Cellulitis of left leg     Family History  Problem Relation Age of Onset  . Stroke Mother   . Hypertension Father   . Heart disease Father   . Lung cancer Maternal Aunt   . Emphysema Mother   . Emphysema Father   . Heart attack Neg Hx   . Stroke Paternal Grandmother     Social History   Social History  . Marital Status: Married    Spouse Name: N/A  . Number of Children: 2  . Years of Education: N/A   Occupational History  . [employer]     no work lately   Social History Main Topics  . Smoking status: Never Smoker   . Smokeless tobacco: Current User    Types: Chew  . Alcohol Use: 0.0 oz/week    0 Standard drinks or equivalent per week     Comment: 4-6 drinks daily - NO ETOH SINCE APRIL 2016  . Drug Use: Yes     Comment: remote use of marijuana in the past, has since quit  . Sexual Activity: No   Other Topics Concern  . Not on file   Social History Narrative  Lives with wife   Occupation: full disability after cirrhosis dx, some working    Activity: some yardwork   Diet: good water, good fruit intake, lots of red meats    Allergies  Allergen Reactions  . Nsaids Other (See Comments)    cirrhosis  . Tylenol [Acetaminophen] Other (See Comments)    cirrhosis  . Ambien [Zolpidem Tartrate] Other (See Comments)    Over-toxicity from liver failure  . Hydrochlorothiazide W-Triamterene Other (See Comments)    REACTION: dizzy, nausea  . Lisinopril Other (See Comments)    REACTION: cough, decreased libido     Constitutional: Positive headache and weight gain. Denies fatigue, fever or abrupt weight changes.  HEENT:  Positive eye pain, facial pain, nasal congestion. Denies eye redness, ear pain, ringing in the ears, wax buildup, runny nose or sore throat. Respiratory:  Denies  cough, difficulty breathing or shortness of breath.  Cardiovascular: Pt reports swelling in his legs. Denies chest pain, chest tightness, palpitations or swelling in the hands.  MSK: Pt reports muscle cramps in hands. Denies joint pain or swelling.   No other specific complaints in a complete review of systems (except as listed in HPI above).  Objective:  BP 128/68 mmHg  Pulse 88  Temp(Src) 98.2 F (36.8 C) (Oral)  Wt 274 lb (124.286 kg)  SpO2 98%   General: Appears his stated age, obese in NAD. HEENT: Head: normal shape and size, no sinus tenderness noted; Eyes: sclera white, icteric, conjunctiva pink; Ears: Tm's gray and intact, normal light reflex; Nose: mucosa pink and moist, septum midline; Throat/Mouth: + PND. Teeth present, mucosa pink and moist, no exudate noted, no lesions or ulcerations noted.  Neck:  No adenopathy noted.  Cardiovascular: Normal rate and rhythm. S1,S2 noted.  No murmur, rubs or gallops noted. 2+ pitting edema BLE. Pulmonary/Chest: Normal effort and positive vesicular breath sounds. No respiratory distress. No wheezes, rales or ronchi noted.  MSK: Calves swollen but not tender, red or warm. No pain with palpaiton of the calves, no difficulty with gait. Strength 5/5 BLE. Neuro: Sensation intact to BLE.    Assessment & Plan:   Allergic Rhinitis  Can use a Neti Pot which can be purchased from your local drug store. Start taking your Flonase in the morning and Allegra at night S/S of sinus infection discussed  Muscle cramps:  He is drinking plenty of water His electrolytes were normal last check ? RLS- advised him to follow up with PCP to discuss this  CHF diastolic and CKD:  Echo reviewed Creatinine 1.63 Advised him to go ahead and take an extra dose of Lasix Consume a low salt diet Start daily weights Follow up with PCP- if extreme swelling persist  Alcoholic Cirrhosis of liver:  Does not appear to be encephalopathic He will continue Lactulose  as directed Advised him he would need to follow up with Dr. Ardis Hughs or PCP for new referral to [facility] hepatology   RTC as needed or if symptoms persist.

## 2015-10-22 NOTE — Patient Instructions (Signed)
Allergic Rhinitis Allergic rhinitis is when the mucous membranes in the nose respond to allergens. Allergens are particles in the air that cause your body to have an allergic reaction. This causes you to release allergic antibodies. Through a chain of events, these eventually cause you to release histamine into the blood stream. Although meant to protect the body, it is this release of histamine that causes your discomfort, such as frequent sneezing, congestion, and an itchy, runny nose.  CAUSES Seasonal allergic rhinitis (hay fever) is caused by pollen allergens that may come from grasses, trees, and weeds. Year-round allergic rhinitis (perennial allergic rhinitis) is caused by allergens such as house dust mites, pet dander, and mold spores. SYMPTOMS  Nasal stuffiness (congestion).  Itchy, runny nose with sneezing and tearing of the eyes. DIAGNOSIS Your health care provider can help you determine the allergen or allergens that trigger your symptoms. If you and your health care provider are unable to determine the allergen, skin or blood testing may be used. Your health care provider will diagnose your condition after taking your health history and performing a physical exam. Your health care provider may assess you for other related conditions, such as asthma, pink eye, or an ear infection. TREATMENT Allergic rhinitis does not have a cure, but it can be controlled by:  Medicines that block allergy symptoms. These may include allergy shots, nasal sprays, and oral antihistamines.  Avoiding the allergen. Hay fever may often be treated with antihistamines in pill or nasal spray forms. Antihistamines block the effects of histamine. There are over-the-counter medicines that may help with nasal congestion and swelling around the eyes. Check with your health care provider before taking or giving this medicine. If avoiding the allergen or the medicine prescribed do not work, there are many new medicines  your health care provider can prescribe. Stronger medicine may be used if initial measures are ineffective. Desensitizing injections can be used if medicine and avoidance does not work. Desensitization is when a patient is given ongoing shots until the body becomes less sensitive to the allergen. Make sure you follow up with your health care provider if problems continue. HOME CARE INSTRUCTIONS It is not possible to completely avoid allergens, but you can reduce your symptoms by taking steps to limit your exposure to them. It helps to know exactly what you are allergic to so that you can avoid your specific triggers. SEEK MEDICAL CARE IF:  You have a fever.  You develop a cough that does not stop easily (persistent).  You have shortness of breath.  You start wheezing.  Symptoms interfere with normal daily activities.   This information is not intended to replace advice given to you by your health care provider. Make sure you discuss any questions you have with your health care provider.   Document Released: 03/08/2001 Document Revised: 07/04/2014 Document Reviewed: 02/18/2013 Elsevier Interactive Patient Education 2016 Elsevier Inc.  

## 2015-10-22 NOTE — Progress Notes (Signed)
Pre visit review using our clinic review tool, if applicable. No additional management support is needed unless otherwise documented below in the visit note. 

## 2015-10-26 LAB — COMPREHENSIVE METABOLIC PANEL
ALK PHOS: 139 U/L
ALT: 46
AST: 69 U/L
Albumin: 2.5
BILIRUBIN TOTAL: 5.6 mg/dL
BUN: 27 mg/dL — AB (ref 4–21)
CREATININE: 1.94
Calcium: 9 mg/dL
GGT: 45 U/L (ref 18–76)
Glucose: 89
MAGNESIUM: 1.4
PROTEIN: 7.8
Potassium: 4.3 mmol/L
Sodium: 133

## 2015-10-26 LAB — AMYLASE: Amylase: 153

## 2015-10-26 LAB — AMMONIA: Ammonia: 33

## 2015-10-26 LAB — CBC
HGB: 10 g/dL
WBC: 5.5
platelet count: 40

## 2015-10-26 LAB — IRON,TIBC AND FERRITIN PANEL
Ferritin: 399
Iron: 229
TRANSFERRIN SATURATION: 86
TRANSFERRIN: 191

## 2015-10-26 LAB — THYROID PANEL
TSH: 0.912
Thyroxine (T4): 0.95

## 2015-10-26 LAB — POCT INR: INR: 1.8

## 2015-10-26 LAB — ALPHA FETO PROTEIN (PARALLEL TESTING): AFP MARKER: 8.1 ng/mL (ref ?–15.0)

## 2015-10-28 NOTE — Telephone Encounter (Signed)
Colton Porter with [facility] left v/m wanting to verify lactulose quantity of # 240 ml that was refilled on 10/13/15. Pt takes 30 ml daily.Please advise.

## 2015-10-29 MED ORDER — LACTULOSE 10 GM/15ML PO SOLN: 20.0000 g | Freq: Every day | ORAL | Status: DC

## 2015-10-29 NOTE — Telephone Encounter (Signed)
Sent in new quantity.  plz notify pt should be ready at pharmacy.

## 2015-10-29 NOTE — Addendum Note (Signed)
Addended by: Ria Bush on: 10/29/2015 02:22 PM   Modules accepted: Orders

## 2015-10-29 NOTE — Telephone Encounter (Signed)
Pt left v/m requesting cb with status of lactulose refill.

## 2015-11-03 ENCOUNTER — Ambulatory Visit (INDEPENDENT_AMBULATORY_CARE_PROVIDER_SITE_OTHER): Payer: BLUE CROSS/BLUE SHIELD | Admitting: Family Medicine

## 2015-11-03 ENCOUNTER — Encounter: Payer: Self-pay | Admitting: Family Medicine

## 2015-11-03 VITALS — BP 130/64 | HR 96 | Temp 98.1°F | Wt 275.5 lb

## 2015-11-03 DIAGNOSIS — K7682 Hepatic encephalopathy: Secondary | ICD-10-CM

## 2015-11-03 DIAGNOSIS — D61818 Other pancytopenia: Secondary | ICD-10-CM

## 2015-11-03 DIAGNOSIS — K7030 Alcoholic cirrhosis of liver without ascites: Secondary | ICD-10-CM | POA: Diagnosis not present

## 2015-11-03 DIAGNOSIS — D696 Thrombocytopenia, unspecified: Secondary | ICD-10-CM

## 2015-11-03 DIAGNOSIS — K7290 Hepatic failure, unspecified without coma: Secondary | ICD-10-CM

## 2015-11-03 DIAGNOSIS — F1021 Alcohol dependence, in remission: Secondary | ICD-10-CM

## 2015-11-03 DIAGNOSIS — N183 Chronic kidney disease, stage 3 unspecified: Secondary | ICD-10-CM

## 2015-11-03 DIAGNOSIS — R600 Localized edema: Secondary | ICD-10-CM

## 2015-11-03 NOTE — Progress Notes (Signed)
Pre visit review using our clinic review tool, if applicable. No additional management support is needed unless otherwise documented below in the visit note. 

## 2015-11-03 NOTE — Progress Notes (Signed)
BP 130/64 mmHg  Pulse 96  Temp(Src) 98.1 F (36.7 C) (Oral)  Wt 275 lb 8 oz (124.966 kg)   CC: f/u visit  Subjective:    Patient ID: Colton Porter, male    DOB: [birth year], 50 y.o.   MRN: [id number]  HPI: Colton Porter is a 50 y.o. male presenting on 11/03/2015 for Follow-up   Presents with sister today.  Seen 10/22/2015 by Rollene Fare with sinus congestion and worsening pedal edema - lasix increased x1. Ongoing sinus congestion stays stopped up. Using flonase in am and allergy pill in afternoons. Noticing more nose bleeds - recent plt function decreased to 40. INR 1.8.   Last saw [facility] liver transplant clinic on May 1st - met with financial department and dietician. Last saw provider April. Feels communication from [facility] is not as well as he'd like, requests referral to [facility] for second opinion on liver transplant.   Denies fevers/chills, abd pain, confusion. No AMS. Loose stools 4-5 times daily. Reports compliance with current meds.  Diagnosed with alcoholic liver disease 09/7423, abstinent from alcohol since then. Initially saw Dr Dalene Carrow GI, referred to [facility] transplant clinic. He did complete psychological evaluation as well as substance abuse counseling/classes.   Brings labwork from last week showing TBili jumped to 5.6, Cr 1.8, Alb 2.5, INR 1.8 correlating with MELD score of 34 (prior meld 20s)  Relevant past medical, surgical, family and social history reviewed and updated as indicated. Interim medical history since our last visit reviewed. Allergies and medications reviewed and updated. Current Outpatient Prescriptions on File Prior to Visit  Medication Sig  . fluticasone (FLONASE) 50 MCG/ACT nasal spray Place 2 sprays into both nostrils daily.  . furosemide (LASIX) 40 MG tablet Take '40mg'$  in am and '20mg'$  in PM  . lactulose (CHRONULAC) 10 GM/15ML solution Take 30 mLs (20 g total) by mouth daily.  Marland Kitchen omeprazole (PRILOSEC) 40 MG capsule Take 1  capsule (40 mg total) by mouth daily.  . phytonadione (MEPHYTON) 5 MG tablet Take 2 tablets (10 mg total) by mouth daily.  . potassium chloride (K-DUR) 10 MEQ tablet Take 1 tablet (10 mEq total) by mouth daily.  . rizatriptan (MAXALT) 5 MG tablet Take 5 mg by mouth as needed for migraine (may repeat in 2 hours if needed for migraine relief). May repeat in 2 hours if needed  . spironolactone (ALDACTONE) 25 MG tablet Take 2 tablets (50 mg total) by mouth 2 (two) times daily.  . traMADol (ULTRAM) 50 MG tablet Take 50 mg by mouth every 12 (twelve) hours as needed for moderate pain.  Marland Kitchen loratadine (CLARITIN) 10 MG tablet Take 10 mg by mouth daily. Reported on 11/03/2015   No current facility-administered medications on file prior to visit.   Past Medical History  Diagnosis Date  . Allergy   . GERD (gastroesophageal reflux disease)   . Hyperlipidemia   . Hypertension   . Neuropathy ([place])   . Thrombocytopenia ([place]) 12/15/2011  . Chronic diastolic heart failure ([place]) 11/01/2013  . Alcohol dependence ([place]) 07/11/2012  . Alcoholic cirrhosis of liver with ascites ([place]) 07/2014  . OSA on CPAP 07/11/2012    HST 07/2013:  AHI 39/hr.    . Cellulitis of left leg   . CKD (chronic kidney disease) stage 3, GFR 30-59 ml/min 11/04/2015   Past Surgical History  Procedure Laterality Date  . Hand surgery  1997  . Left and right heart catheterization with coronary angiogram N/A 06/10/2013  Procedure: LEFT AND RIGHT HEART CATHETERIZATION WITH CORONARY ANGIOGRAM;  Surgeon: Blane Ohara, MD;  Location[facility] CATH LAB;  Service: Cardiovascular;  Laterality: N/A;  . Colonoscopy  08/2013    hyperplastic polyps, hemorrhoids Ardis Hughs)  . 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 Stroke Mother   . Hypertension Father   . Heart disease Father   . Lung cancer Maternal Aunt   . Emphysema Mother   . Emphysema Father   . Heart  attack Neg Hx   . Stroke Paternal Grandmother     Social History  Substance Use Topics  . Smoking status: Never Smoker   . Smokeless tobacco: Current User    Types: Chew  . Alcohol Use: 0.0 oz/week    0 Standard drinks or equivalent per week     Comment: 4-6 drinks daily - NO ETOH SINCE APRIL 2016    Review of Systems Per HPI unless specifically indicated in ROS section     Objective:    BP 130/64 mmHg  Pulse 96  Temp(Src) 98.1 F (36.7 C) (Oral)  Wt 275 lb 8 oz (124.966 kg)  Wt Readings from Last 3 Encounters:  11/03/15 275 lb 8 oz (124.966 kg)  10/22/15 274 lb (124.286 kg)  09/21/15 269 lb 8 oz (122.244 kg)    Physical Exam  Constitutional: He appears well-developed and well-nourished. No distress.  HENT:  Mouth/Throat: Oropharynx is clear and moist. No oropharyngeal exudate.  Eyes: Scleral icterus is present.  Cardiovascular: Normal rate, regular rhythm, normal heart sounds and intact distal pulses.   No murmur heard. Pulmonary/Chest: Effort normal and breath sounds normal. No respiratory distress. He has no wheezes. He has no rales.  Abdominal: Soft. Bowel sounds are normal. He exhibits distension (mild). He exhibits no mass. There is no tenderness. There is no rebound and no guarding.  Musculoskeletal: He exhibits edema (2+ nonpitting edema).  Skin: Skin is warm and dry.  Marked jaundice  Nursing note and vitals reviewed.  Results for orders placed or performed in visit on 09/07/15  Lipid panel  Result Value Ref Range   Cholesterol 179 0 - 200 mg/dL   Triglycerides 130.0 0.0 - 149.0 mg/dL   HDL 37.10 (L) >39.00 mg/dL   VLDL 26.0 0.0 - 40.0 mg/dL   LDL Cholesterol 116 (H) 0 - 99 mg/dL   Total CHOL/HDL Ratio 5    NonHDL 141.71   Comprehensive metabolic panel  Result Value Ref Range   Sodium 133 (L) 135 - 145 mEq/L   Potassium 4.8 3.5 - 5.1 mEq/L   Chloride 104 96 - 112 mEq/L   CO2 24 19 - 32 mEq/L   Glucose, Bld 118 (H) 70 - 99 mg/dL   BUN 20 6 - 23 mg/dL    Creatinine, Ser 1.63 (H) 0.40 - 1.50 mg/dL   Total Bilirubin 3.9 (H) 0.2 - 1.2 mg/dL   Alkaline Phosphatase 169 (H) 39 - 117 U/L   AST 51 (H) 0 - 37 U/L   ALT 29 0 - 53 U/L   Total Protein 7.0 6.0 - 8.3 g/dL   Albumin 2.5 (L) 3.5 - 5.2 g/dL   Calcium 8.7 8.4 - 10.5 mg/dL   GFR 48.04 (L) >60.00 mL/min  CBC with Differential/Platelet  Result Value Ref Range   WBC 3.7 (L) 4.0 - 10.5 K/uL   RBC 2.89 (L) 4.22 - 5.81 Mil/uL   Hemoglobin 10.1 (  L) 13.0 - 17.0 g/dL   HCT 29.3 (L) 39.0 - 52.0 %   MCV 101.1 (H) 78.0 - 100.0 fl   MCHC 34.7 30.0 - 36.0 g/dL   RDW 16.3 (H) 11.5 - 15.5 %   Platelets 62.0 (L) 150.0 - 400.0 K/uL   Neutrophils Relative % 65.3 43.0 - 77.0 %   Lymphocytes Relative 21.2 12.0 - 46.0 %   Monocytes Relative 7.9 3.0 - 12.0 %   Eosinophils Relative 4.8 0.0 - 5.0 %   Basophils Relative 0.8 0.0 - 3.0 %   Neutro Abs 2.4 1.4 - 7.7 K/uL   Lymphs Abs 0.8 0.7 - 4.0 K/uL   Monocytes Absolute 0.3 0.1 - 1.0 K/uL   Eosinophils Absolute 0.2 0.0 - 0.7 K/uL   Basophils Absolute 0.0 0.0 - 0.1 K/uL  Protime-INR  Result Value Ref Range   INR 1.6 (H) 0.8 - 1.0 ratio   Prothrombin Time 17.1 (H) 9.6 - 13.1 sec  Magnesium  Result Value Ref Range   Magnesium 1.8 1.5 - 2.5 mg/dL      Assessment & Plan:   Problem List Items Addressed This Visit    Pedal edema    Continue lasix '40mg'$ /'20mg'$  bid, spironolactone '50mg'$  bid      Relevant Orders   Amb Referral to Hepatology   Thrombocytopenia ([place])   History of alcohol dependence ([place])    Abstinent since 09/2014.      Alcoholic cirrhosis of liver without ascites (H[facility]) - Primary    MELD score 24 --> 34 over the past month demonstrating marked progression of liver failure, unclear cause. Denies fever, abd pain. Asks for referral to academic center for second opinion (some family lives/works in [facility]) - will expedite referral and see where we can get patient seen sooner given progression of liver disease. Pt/sister agree with plan.       Relevant Orders   Amb Referral to Hepatology   Pancytopenia, acquired ([place])    WBC improved on recent labs to 5.5, Hgb stable at 10, plt dropped 50s --> 40s.       Relevant Orders   Amb Referral to Hepatology   Hepatic encephalopathy ([place])    No current signs of active encephalopathy      Hypomagnesemia    Increase mag ox to BID given low magnesium on recent labwork.      CKD (chronic kidney disease) stage 3, GFR 30-59 ml/min    Noted development of chronic kidney disease over the past 1 year.       Relevant Orders   Amb Referral to Hepatology       Follow up plan: Return in about 4 weeks (around 12/01/2015), or as needed, for follow up visit.  Ria Bush, MD

## 2015-11-03 NOTE — Patient Instructions (Addendum)
Liver disease has worsened.  Stop flonase if having nosebleeds.  Increase magnesium to 1 tablet twice daily.  Increase lasix to 1 tablet in am and 1/2 tablet at early afternoon.  We will call you tomorrow to schedule appointment with liver clinic at [place] or [place].  Return in 1 month for follow up. Call me with questions.

## 2015-11-04 ENCOUNTER — Encounter: Payer: Self-pay | Admitting: Family Medicine

## 2015-11-04 DIAGNOSIS — N183 Chronic kidney disease, stage 3 unspecified: Secondary | ICD-10-CM | POA: Insufficient documentation

## 2015-11-04 HISTORY — DX: Chronic kidney disease, stage 3 unspecified: N18.30

## 2015-11-04 NOTE — Assessment & Plan Note (Signed)
Abstinent since 09/2014. 

## 2015-11-04 NOTE — Assessment & Plan Note (Signed)
Increase mag ox to BID given low magnesium on recent labwork.

## 2015-11-04 NOTE — Assessment & Plan Note (Signed)
No current signs of active encephalopathy

## 2015-11-04 NOTE — Assessment & Plan Note (Signed)
WBC improved on recent labs to 5.5, Hgb stable at 10, plt dropped 50s --> 40s.

## 2015-11-04 NOTE — Assessment & Plan Note (Signed)
Noted development of chronic kidney disease over the past 1 year.

## 2015-11-04 NOTE — Assessment & Plan Note (Addendum)
MELD score 24 --> 34 over the past month demonstrating marked progression of liver failure, unclear cause. Denies fever, abd pain. Asks for referral to academic center for second opinion (some family lives/works in [place] [facility]) - will expedite referral and see where we can get patient seen sooner given progression of liver disease. Pt/sister agree with plan.

## 2015-11-04 NOTE — Assessment & Plan Note (Signed)
Continue lasix 40mg /20mg  bid, spironolactone 50mg  bid

## 2015-11-09 ENCOUNTER — Encounter: Payer: Self-pay | Admitting: Family Medicine

## 2015-11-10 ENCOUNTER — Telehealth: Payer: Self-pay | Admitting: Family Medicine

## 2015-11-10 NOTE — Telephone Encounter (Signed)
Erin,Liver Transplant,returned Kim's call.

## 2015-11-10 NOTE — Telephone Encounter (Signed)
E-mailed Erin.

## 2015-11-10 NOTE — Telephone Encounter (Signed)
Thanks for taking care of this.

## 2015-11-11 ENCOUNTER — Telehealth: Payer: Self-pay | Admitting: Family Medicine

## 2015-11-11 NOTE — Telephone Encounter (Signed)
fmla paperwork faxed over for pt spouse  IN dr Synthia Innocent IN BOX for review and signature

## 2015-11-13 ENCOUNTER — Other Ambulatory Visit: Payer: Self-pay | Admitting: Nurse Practitioner

## 2015-11-13 DIAGNOSIS — Z8719 Personal history of other diseases of the digestive system: Secondary | ICD-10-CM

## 2015-11-14 NOTE — Telephone Encounter (Signed)
FMLA filled and in Kim's box. Can we get an update on [facility] referral? If no upcoming appt I would like to touch base with [place] this week re: pt's progressing liver disease.  Cc:ing Maudie Mercury and [employer]

## 2015-11-16 ENCOUNTER — Ambulatory Visit (INDEPENDENT_AMBULATORY_CARE_PROVIDER_SITE_OTHER): Payer: BLUE CROSS/BLUE SHIELD | Admitting: Family Medicine

## 2015-11-16 ENCOUNTER — Telehealth: Payer: Self-pay | Admitting: Family Medicine

## 2015-11-16 ENCOUNTER — Encounter: Payer: Self-pay | Admitting: Family Medicine

## 2015-11-16 VITALS — BP 120/60 | HR 84 | Temp 98.2°F | Wt 284.2 lb

## 2015-11-16 DIAGNOSIS — R600 Localized edema: Secondary | ICD-10-CM | POA: Diagnosis not present

## 2015-11-16 DIAGNOSIS — K7030 Alcoholic cirrhosis of liver without ascites: Secondary | ICD-10-CM | POA: Diagnosis not present

## 2015-11-16 LAB — PROTIME-INR
INR: 1.7 ratio — ABNORMAL HIGH (ref 0.8–1.0)
Prothrombin Time: 18.4 s — ABNORMAL HIGH (ref 9.6–13.1)

## 2015-11-16 LAB — COMPREHENSIVE METABOLIC PANEL
ALBUMIN: 2.5 g/dL — AB (ref 3.5–5.2)
ALK PHOS: 181 U/L — AB (ref 39–117)
ALT: 32 U/L (ref 0–53)
AST: 50 U/L — AB (ref 0–37)
BILIRUBIN TOTAL: 4.2 mg/dL — AB (ref 0.2–1.2)
BUN: 23 mg/dL (ref 6–23)
CO2: 21 mEq/L (ref 19–32)
CREATININE: 1.67 mg/dL — AB (ref 0.40–1.50)
Calcium: 8.1 mg/dL — ABNORMAL LOW (ref 8.4–10.5)
Chloride: 103 mEq/L (ref 96–112)
GFR: 46.67 mL/min — ABNORMAL LOW (ref >60.00)
GLUCOSE: 118 mg/dL — AB (ref 70–99)
Potassium: 5.3 mEq/L — ABNORMAL HIGH (ref 3.5–5.1)
SODIUM: 127 meq/L — AB (ref 135–145)
TOTAL PROTEIN: 6.4 g/dL (ref 6.0–8.3)

## 2015-11-16 LAB — CK: CK TOTAL: 150 U/L (ref 7–232)

## 2015-11-16 NOTE — Telephone Encounter (Signed)
Completed paperwork (FMLA) given to Ramond Craver.

## 2015-11-16 NOTE — Patient Instructions (Addendum)
Labs today. Double up on lasix to 80mg  (4) in am and 40mg  (2) in pm for 3 days then update me with effect. After this we will decide on lasix (furosemide) dosing.   Continue spironolactone 50mg  twice daily.  Keep next appointment.

## 2015-11-16 NOTE — Progress Notes (Signed)
Pre visit review using our clinic review tool, if applicable. No additional management support is needed unless otherwise documented below in the visit note. 

## 2015-11-16 NOTE — Telephone Encounter (Signed)
plz schedule appt 12:45pm with me. Thanks.

## 2015-11-16 NOTE — Telephone Encounter (Signed)
Spoke to patient by telephone and was advised that can be at the office at 12:45.

## 2015-11-16 NOTE — Telephone Encounter (Signed)
I left early on Thursday and received vm from [place] at [facility]. Pt was seen by Dr. Marty Heck on Thursday May 18th in their [facility] office.

## 2015-11-16 NOTE — Telephone Encounter (Signed)
Faxed paperwork Rhonda aware Copy for file  Copy for scan Copy for pt

## 2015-11-16 NOTE — Assessment & Plan Note (Signed)
9lb weight gain over last 2 wks, endorses most over this past weekend after more active outdoors. No significant change in dyspnea and actually improved jaundice however. Will increase lasix to 80mg  /40mg  for next 3 days and pt will update me with effect. Check CMP, CPK, INR today to update meld score.

## 2015-11-16 NOTE — Progress Notes (Signed)
BP 120/60 mmHg  Pulse 84  Temp(Src) 98.2 F (36.8 C) (Oral)  Wt 284 lb 4 oz (128.935 kg)  SpO2 99%   CC: weight gain  Subjective:    Patient ID: Colton Porter, male    DOB: [birth year], 50 y.o.   MRN: [id number]  HPI: Colton Porter is a 50 y.o. male presenting on 11/16/2015 for Weight Gain   Over weekend noticing weight gain and worsening leg swelling. Some calf myalgias as well.   He was more active early this week - working in the yard on hot weather, increased water intake. May have overdone it  He has been regular with lasix 40/20, spironolactone 50mg  bid.   Overall stable dyspnea.   Saw Colton Colton Porter - told him he would have to start anew transplant evaluation - so wants to continue with Colton Porter (Colton Porter/Colton Porter).   Pending MRI with [facility] on Saturday. F/u appt with transplant clinic June 5th.   Relevant past medical, surgical, family and social history reviewed and updated as indicated. Interim medical history since our last visit reviewed. Allergies and medications reviewed and updated. Current Outpatient Prescriptions on File Prior to Visit  Medication Sig  . fluticasone (FLONASE) 50 MCG/ACT nasal spray Place 2 sprays into both nostrils daily.  . furosemide (LASIX) 40 MG tablet Take 40mg  in am and 20mg  in PM  . lactulose (CHRONULAC) 10 GM/15ML solution Take 30 mLs (20 g total) by mouth daily.  . magnesium oxide (MAG-OX) 400 (241.3 Mg) MG tablet Take 1 tablet (400 mg total) by mouth 2 (two) times daily.  Marland Kitchen omeprazole (PRILOSEC) 40 MG capsule Take 1 capsule (40 mg total) by mouth daily.  . phytonadione (MEPHYTON) 5 MG tablet Take 2 tablets (10 mg total) by mouth daily.  . potassium chloride (K-DUR) 10 MEQ tablet Take 1 tablet (10 mEq total) by mouth daily.  . rizatriptan (MAXALT) 5 MG tablet Take 5 mg by mouth as needed for migraine (may repeat in 2 hours if needed for migraine relief). May repeat in 2 hours if needed  . spironolactone (ALDACTONE) 25 MG tablet Take 2 tablets  (50 mg total) by mouth 2 (two) times daily.  . traMADol (ULTRAM) 50 MG tablet Take 50 mg by mouth every 12 (twelve) hours as needed for moderate pain.  Marland Kitchen loratadine (CLARITIN) 10 MG tablet Take 10 mg by mouth daily. Reported on 11/16/2015   No current facility-administered medications on file prior to visit.    Review of Systems Per HPI unless specifically indicated in ROS section     Objective:    BP 120/60 mmHg  Pulse 84  Temp(Src) 98.2 F (36.8 C) (Oral)  Wt 284 lb 4 oz (128.935 kg)  SpO2 99%  Wt Readings from Last 3 Encounters:  11/16/15 284 lb 4 oz (128.935 kg)  11/03/15 275 lb 8 oz (124.966 kg)  10/22/15 274 lb (124.286 kg)    Physical Exam  Constitutional: He appears well-developed and well-nourished. No distress.  HENT:  Head: Normocephalic and atraumatic.  Mouth/Throat: Oropharynx is clear and moist. No oropharyngeal exudate.  Cardiovascular: Normal rate, regular rhythm, normal heart sounds and intact distal pulses.   No murmur heard. Pulmonary/Chest: Effort normal and breath sounds normal. No respiratory distress. He has no wheezes. He has no rales.  Abdominal: Soft. Bowel sounds are normal. He exhibits distension. He exhibits no fluid wave and no mass. There is no hepatosplenomegaly. There is no tenderness. There is no rigidity, no rebound, no guarding, no CVA  tenderness and negative Murphy's sign.  Musculoskeletal: He exhibits edema (marked nonpitting with 1+ pitting to thighs).  Skin: Skin is warm and dry. No rash noted.  Mild jaundice but improved from last visit  Nursing note and vitals reviewed.  Results for orders placed or performed in visit on 09/07/15  Lipid panel  Result Value Ref Range   Cholesterol 179 0 - 200 mg/dL   Triglycerides 130.0 0.0 - 149.0 mg/dL   HDL 37.10 (L) >39.00 mg/dL   VLDL 26.0 0.0 - 40.0 mg/dL   LDL Cholesterol 116 (H) 0 - 99 mg/dL   Total CHOL/HDL Ratio 5    NonHDL 141.71   Comprehensive metabolic panel  Result Value Ref Range    Sodium 133 (L) 135 - 145 mEq/L   Potassium 4.8 3.5 - 5.1 mEq/L   Chloride 104 96 - 112 mEq/L   CO2 24 19 - 32 mEq/L   Glucose, Bld 118 (H) 70 - 99 mg/dL   BUN 20 6 - 23 mg/dL   Creatinine, Ser 1.63 (H) 0.40 - 1.50 mg/dL   Total Bilirubin 3.9 (H) 0.2 - 1.2 mg/dL   Alkaline Phosphatase 169 (H) 39 - 117 U/L   AST 51 (H) 0 - 37 U/L   ALT 29 0 - 53 U/L   Total Protein 7.0 6.0 - 8.3 g/dL   Albumin 2.5 (L) 3.5 - 5.2 g/dL   Calcium 8.7 8.4 - 10.5 mg/dL   GFR 48.04 (L) >60.00 mL/min  CBC with Differential/Platelet  Result Value Ref Range   WBC 3.7 (L) 4.0 - 10.5 K/uL   RBC 2.89 (L) 4.22 - 5.81 Mil/uL   Hemoglobin 10.1 (L) 13.0 - 17.0 g/dL   HCT 29.3 (L) 39.0 - 52.0 %   MCV 101.1 (H) 78.0 - 100.0 fl   MCHC 34.7 30.0 - 36.0 g/dL   RDW 16.3 (H) 11.5 - 15.5 %   Platelets 62.0 (L) 150.0 - 400.0 K/uL   Neutrophils Relative % 65.3 43.0 - 77.0 %   Lymphocytes Relative 21.2 12.0 - 46.0 %   Monocytes Relative 7.9 3.0 - 12.0 %   Eosinophils Relative 4.8 0.0 - 5.0 %   Basophils Relative 0.8 0.0 - 3.0 %   Neutro Abs 2.4 1.4 - 7.7 K/uL   Lymphs Abs 0.8 0.7 - 4.0 K/uL   Monocytes Absolute 0.3 0.1 - 1.0 K/uL   Eosinophils Absolute 0.2 0.0 - 0.7 K/uL   Basophils Absolute 0.0 0.0 - 0.1 K/uL  Protime-INR  Result Value Ref Range   INR 1.6 (H) 0.8 - 1.0 ratio   Prothrombin Time 17.1 (H) 9.6 - 13.1 sec  Magnesium  Result Value Ref Range   Magnesium 1.8 1.5 - 2.5 mg/dL      Assessment & Plan:   Problem List Items Addressed This Visit    Pedal edema    9lb weight gain over last 2 wks, endorses most over this past weekend after more active outdoors. No significant change in dyspnea and actually improved jaundice however. Will increase lasix to 80mg  /40mg  for next 3 days and pt will update me with effect. Check CMP, CPK, INR today to update meld score.       Alcoholic cirrhosis of liver without ascites ([place]) - Primary    Last visit noted marked progression in MELD score from 24->34. Update MELD  score with labs today.  Pending MRI and f/u with liver transplant clinic. Appreciate [facility] transplant team care of patient. Will forward OV and lab results  to [employer].       Relevant Orders   Comprehensive metabolic panel   CK   Protime-INR       Follow up plan: No Follow-up on file.  Ria Bush, MD

## 2015-11-16 NOTE — Telephone Encounter (Signed)
Appointment scheduled at 12:45.

## 2015-11-16 NOTE — Assessment & Plan Note (Signed)
Last visit noted marked progression in MELD score from 24->34. Update MELD score with labs today.  Pending MRI and f/u with liver transplant clinic. Appreciate [facility] transplant team care of patient. Will forward OV and lab results to [facility].

## 2015-11-16 NOTE — Telephone Encounter (Signed)
[facility] called - pt declined to go to the ED disposition - pt has gained weight, and has leg swelling, pt is in liver failure.  TH will be sending over report as well  Thanks

## 2015-11-16 NOTE — Telephone Encounter (Signed)
Patient Name: Colton Porter DOB: Sep 21, 1965 Initial Comment Caller states her brother is having liver failure, has gained weight, retaining fluid and short of breath, Nurse Assessment Nurse: Vallery Sa, RN, Tye [place] Date/Time (Eastern Time): 11/16/2015 8:46:14 AM Confirm and document reason for call. If symptomatic, describe symptoms. You must click the next button to save text entered. ---Caller states [name] developed shortness of breath yesterday. No blueness around. lips. Alert and responsive. he developed swelling of his lower legs several months ago that became worse yesterday. No chest pain. Has the patient traveled out of the country within the last 30 days? ---No Does the patient have any new or worsening symptoms? ---Yes Will a triage be completed? ---Yes Related visit to physician within the last 2 weeks? ---Yes Does the PT have any chronic conditions? (i.e. diabetes, asthma, etc.) ---Yes List chronic conditions. ---Liver Failure, CHF Is this a behavioral health or substance abuse call? ---No Guidelines Guideline Title Affirmed Question Affirmed Notes Breathing Difficulty Recent illness requiring prolonged bedrest (i.e., immobilization) Final Disposition User Go to ED Now Vallery Sa, RN, Tye [place] Comments Caller declined the Go to ER disposition and states that Dr. [name] advised them to call the office to have him seen there instead of going to the ER. Called the office backline and notified Melissa. Melissa states they will call them back. Ann notified. Referrals GO TO FACILITY REFUSED Disagree/Comply: Disagree Disagree/Comply Reason: Disagree with instructions

## 2015-11-19 ENCOUNTER — Other Ambulatory Visit: Payer: Self-pay | Admitting: Family Medicine

## 2015-11-19 DIAGNOSIS — I5032 Chronic diastolic (congestive) heart failure: Secondary | ICD-10-CM

## 2015-11-19 MED ORDER — FUROSEMIDE 80 MG PO TABS: 80.0000 mg | ORAL_TABLET | Freq: Every day | ORAL | Status: DC

## 2015-11-20 ENCOUNTER — Emergency Department (HOSPITAL_COMMUNITY): Payer: BLUE CROSS/BLUE SHIELD

## 2015-11-20 ENCOUNTER — Encounter (HOSPITAL_COMMUNITY): Payer: Self-pay | Admitting: Emergency Medicine

## 2015-11-20 ENCOUNTER — Telehealth: Payer: Self-pay | Admitting: Pulmonary Disease

## 2015-11-20 ENCOUNTER — Telehealth: Payer: Self-pay

## 2015-11-20 DIAGNOSIS — R188 Other ascites: Secondary | ICD-10-CM | POA: Diagnosis not present

## 2015-11-20 DIAGNOSIS — D696 Thrombocytopenia, unspecified: Secondary | ICD-10-CM | POA: Diagnosis not present

## 2015-11-20 DIAGNOSIS — N179 Acute kidney failure, unspecified: Secondary | ICD-10-CM | POA: Diagnosis not present

## 2015-11-20 DIAGNOSIS — N62 Hypertrophy of breast: Secondary | ICD-10-CM | POA: Diagnosis not present

## 2015-11-20 DIAGNOSIS — K7290 Hepatic failure, unspecified without coma: Secondary | ICD-10-CM | POA: Insufficient documentation

## 2015-11-20 DIAGNOSIS — F1020 Alcohol dependence, uncomplicated: Secondary | ICD-10-CM | POA: Insufficient documentation

## 2015-11-20 DIAGNOSIS — R0789 Other chest pain: Principal | ICD-10-CM | POA: Insufficient documentation

## 2015-11-20 DIAGNOSIS — E785 Hyperlipidemia, unspecified: Secondary | ICD-10-CM | POA: Insufficient documentation

## 2015-11-20 DIAGNOSIS — K7030 Alcoholic cirrhosis of liver without ascites: Secondary | ICD-10-CM | POA: Diagnosis not present

## 2015-11-20 DIAGNOSIS — I5032 Chronic diastolic (congestive) heart failure: Secondary | ICD-10-CM | POA: Diagnosis not present

## 2015-11-20 DIAGNOSIS — R739 Hyperglycemia, unspecified: Secondary | ICD-10-CM | POA: Diagnosis not present

## 2015-11-20 DIAGNOSIS — K7460 Unspecified cirrhosis of liver: Secondary | ICD-10-CM | POA: Diagnosis present

## 2015-11-20 DIAGNOSIS — I130 Hypertensive heart and chronic kidney disease with heart failure and stage 1 through stage 4 chronic kidney disease, or unspecified chronic kidney disease: Secondary | ICD-10-CM | POA: Insufficient documentation

## 2015-11-20 DIAGNOSIS — Z6841 Body Mass Index (BMI) 40.0 and over, adult: Secondary | ICD-10-CM | POA: Diagnosis not present

## 2015-11-20 DIAGNOSIS — G4733 Obstructive sleep apnea (adult) (pediatric): Secondary | ICD-10-CM | POA: Diagnosis not present

## 2015-11-20 DIAGNOSIS — N183 Chronic kidney disease, stage 3 (moderate): Secondary | ICD-10-CM | POA: Insufficient documentation

## 2015-11-20 DIAGNOSIS — K219 Gastro-esophageal reflux disease without esophagitis: Secondary | ICD-10-CM | POA: Insufficient documentation

## 2015-11-20 DIAGNOSIS — F40240 Claustrophobia: Secondary | ICD-10-CM | POA: Diagnosis not present

## 2015-11-20 LAB — CBC
HCT: 25.6 % — ABNORMAL LOW (ref 39.0–52.0)
Hemoglobin: 8.7 g/dL — ABNORMAL LOW (ref 13.0–17.0)
MCH: 33.9 pg (ref 26.0–34.0)
MCHC: 34 g/dL (ref 30.0–36.0)
MCV: 99.6 fL (ref 78.0–100.0)
PLATELETS: 44 10*3/uL — AB (ref 150–400)
RBC: 2.57 MIL/uL — ABNORMAL LOW (ref 4.22–5.81)
RDW: 15.5 % (ref 11.5–15.5)
WBC: 4.3 10*3/uL (ref 4.0–10.5)

## 2015-11-20 LAB — BASIC METABOLIC PANEL
Anion gap: 8 (ref 5–15)
BUN: 25 mg/dL — ABNORMAL HIGH (ref 6–20)
CALCIUM: 8.8 mg/dL — AB (ref 8.9–10.3)
CO2: 23 mmol/L (ref 22–32)
CREATININE: 2.12 mg/dL — AB (ref 0.61–1.24)
Chloride: 101 mmol/L (ref 101–111)
GFR calc non Af Amer: 35 mL/min — ABNORMAL LOW (ref >60)
GFR, EST AFRICAN AMERICAN: 40 mL/min — AB (ref >60)
GLUCOSE: 152 mg/dL — AB (ref 65–99)
Potassium: 4.4 mmol/L (ref 3.5–5.1)
Sodium: 132 mmol/L — ABNORMAL LOW (ref 135–145)

## 2015-11-20 LAB — I-STAT TROPONIN, ED: Troponin i, poc: 0.01 ng/mL (ref 0.00–0.08)

## 2015-11-20 MED ORDER — ONDANSETRON HCL 4 MG PO TABS: 4.0000 mg | ORAL_TABLET | Freq: Three times a day (TID) | ORAL | Status: DC | PRN

## 2015-11-20 MED ORDER — PROMETHAZINE HCL 25 MG PO TABS: 12.5000 mg | ORAL_TABLET | Freq: Two times a day (BID) | ORAL | Status: DC | PRN

## 2015-11-20 NOTE — ED Notes (Signed)
Pt here with CP starting this morning. Pt reports pain to left shoulder and diaphoresis. Pt is on the liver transplant list in [place] per wife. Pt denies cardiac history.

## 2015-11-20 NOTE — Telephone Encounter (Signed)
Received printed copy of e-mail that was sent to management @ 4:19pm today:  Subject: CPAP Therapy Message:  [facility] is requesting an updated/new script for my CPAP supplies.  Can you please have someone contact them with a new script?  I know it has been years since I have been there so if this is not approved can you please contact my PCP at [facility]?  I see him regularly.  His name is Dr. Ria Bush.  If you need to contact me, please contact my wife Esias Mctier at [phone] as I am battling encephalopathy and cannot remember anything.  She is on my HIPAA.  Thank you.    Pt last seen 4.24.15 by [facility], who is no longer in the office 1st available [place] is 7.31.17 w/ RA - would recommend sending request to PCP as RA would need to authorize sending of supplies before seeing pt E-mail sent to patient and note routed to pt's PCP

## 2015-11-20 NOTE — ED Notes (Signed)
EKG completed at 2248. Shown to Dr Dayna Barker.

## 2015-11-20 NOTE — Telephone Encounter (Signed)
Ann (DPR signed) left v/m that pt is in liver failure and this morning after eating breakfast pt became nauseated. Pt request med for nausea to [facility].pt last seen 11/16/15.

## 2015-11-20 NOTE — Telephone Encounter (Signed)
Phenergan sent in. 1/2-1 tab prn.

## 2015-11-21 ENCOUNTER — Observation Stay (HOSPITAL_COMMUNITY)
Admission: EM | Admit: 2015-11-21 | Discharge: 2015-11-23 | Disposition: A | Discharge status: Home / Self Care | Payer: BLUE CROSS/BLUE SHIELD | Attending: Internal Medicine | Admitting: Internal Medicine

## 2015-11-21 ENCOUNTER — Observation Stay (HOSPITAL_COMMUNITY): Payer: BLUE CROSS/BLUE SHIELD

## 2015-11-21 ENCOUNTER — Inpatient Hospital Stay
Admission: RE | Admit: 2015-11-21 | Discharge: 2015-11-21 | Disposition: A | Discharge status: Home / Self Care | Payer: BLUE CROSS/BLUE SHIELD | Source: Ambulatory Visit | Attending: Nurse Practitioner | Admitting: Nurse Practitioner

## 2015-11-21 ENCOUNTER — Encounter (HOSPITAL_COMMUNITY): Payer: Self-pay | Admitting: Internal Medicine

## 2015-11-21 DIAGNOSIS — K219 Gastro-esophageal reflux disease without esophagitis: Secondary | ICD-10-CM | POA: Diagnosis not present

## 2015-11-21 DIAGNOSIS — Z8719 Personal history of other diseases of the digestive system: Secondary | ICD-10-CM | POA: Diagnosis present

## 2015-11-21 DIAGNOSIS — K7030 Alcoholic cirrhosis of liver without ascites: Secondary | ICD-10-CM | POA: Diagnosis present

## 2015-11-21 DIAGNOSIS — F1021 Alcohol dependence, in remission: Secondary | ICD-10-CM

## 2015-11-21 DIAGNOSIS — R079 Chest pain, unspecified: Secondary | ICD-10-CM

## 2015-11-21 DIAGNOSIS — R0602 Shortness of breath: Secondary | ICD-10-CM

## 2015-11-21 DIAGNOSIS — D696 Thrombocytopenia, unspecified: Secondary | ICD-10-CM | POA: Diagnosis present

## 2015-11-21 DIAGNOSIS — R0789 Other chest pain: Secondary | ICD-10-CM | POA: Diagnosis not present

## 2015-11-21 DIAGNOSIS — D649 Anemia, unspecified: Secondary | ICD-10-CM

## 2015-11-21 DIAGNOSIS — I5030 Unspecified diastolic (congestive) heart failure: Secondary | ICD-10-CM | POA: Diagnosis present

## 2015-11-21 DIAGNOSIS — E785 Hyperlipidemia, unspecified: Secondary | ICD-10-CM | POA: Diagnosis present

## 2015-11-21 DIAGNOSIS — N62 Hypertrophy of breast: Secondary | ICD-10-CM | POA: Diagnosis not present

## 2015-11-21 DIAGNOSIS — I5032 Chronic diastolic (congestive) heart failure: Secondary | ICD-10-CM

## 2015-11-21 DIAGNOSIS — I10 Essential (primary) hypertension: Secondary | ICD-10-CM

## 2015-11-21 DIAGNOSIS — N183 Chronic kidney disease, stage 3 (moderate): Secondary | ICD-10-CM

## 2015-11-21 DIAGNOSIS — I209 Angina pectoris, unspecified: Secondary | ICD-10-CM

## 2015-11-21 DIAGNOSIS — Z6841 Body Mass Index (BMI) 40.0 and over, adult: Secondary | ICD-10-CM

## 2015-11-21 DIAGNOSIS — N179 Acute kidney failure, unspecified: Secondary | ICD-10-CM | POA: Diagnosis present

## 2015-11-21 DIAGNOSIS — E1169 Type 2 diabetes mellitus with other specified complication: Secondary | ICD-10-CM | POA: Diagnosis present

## 2015-11-21 DIAGNOSIS — K7460 Unspecified cirrhosis of liver: Secondary | ICD-10-CM

## 2015-11-21 DIAGNOSIS — G9340 Encephalopathy, unspecified: Secondary | ICD-10-CM | POA: Diagnosis present

## 2015-11-21 HISTORY — DX: Anemia, unspecified: D64.9

## 2015-11-21 LAB — CBC WITH DIFFERENTIAL/PLATELET
BASOS ABS: 0 10*3/uL (ref 0.0–0.1)
BASOS PCT: 1 %
Eosinophils Absolute: 0.1 10*3/uL (ref 0.0–0.7)
Eosinophils Relative: 3 %
HEMATOCRIT: 23.9 % — AB (ref 39.0–52.0)
Hemoglobin: 8.1 g/dL — ABNORMAL LOW (ref 13.0–17.0)
LYMPHS PCT: 30 %
Lymphs Abs: 1.2 10*3/uL (ref 0.7–4.0)
MCH: 33.8 pg (ref 26.0–34.0)
MCHC: 33.9 g/dL (ref 30.0–36.0)
MCV: 99.6 fL (ref 78.0–100.0)
MONO ABS: 0.4 10*3/uL (ref 0.1–1.0)
Monocytes Relative: 11 %
NEUTROS ABS: 2.1 10*3/uL (ref 1.7–7.7)
Neutrophils Relative %: 56 %
PLATELETS: 41 10*3/uL — AB (ref 150–400)
RBC: 2.4 MIL/uL — AB (ref 4.22–5.81)
RDW: 15.5 % (ref 11.5–15.5)
WBC: 3.8 10*3/uL — AB (ref 4.0–10.5)

## 2015-11-21 LAB — BASIC METABOLIC PANEL
ANION GAP: 10 (ref 5–15)
BUN: 29 mg/dL — ABNORMAL HIGH (ref 6–20)
CALCIUM: 8.9 mg/dL (ref 8.9–10.3)
CO2: 23 mmol/L (ref 22–32)
Chloride: 100 mmol/L — ABNORMAL LOW (ref 101–111)
Creatinine, Ser: 2.12 mg/dL — ABNORMAL HIGH (ref 0.61–1.24)
GFR, EST AFRICAN AMERICAN: 40 mL/min — AB (ref >60)
GFR, EST NON AFRICAN AMERICAN: 35 mL/min — AB (ref >60)
Glucose, Bld: 86 mg/dL (ref 65–99)
POTASSIUM: 4.8 mmol/L (ref 3.5–5.1)
Sodium: 133 mmol/L — ABNORMAL LOW (ref 135–145)

## 2015-11-21 LAB — TROPONIN I
TROPONIN I: 0.03 ng/mL (ref <0.031)
TROPONIN I: 0.03 ng/mL (ref <0.031)
Troponin I: 0.03 ng/mL (ref <0.031)

## 2015-11-21 LAB — AMMONIA: AMMONIA: 79 umol/L — AB (ref 9–35)

## 2015-11-21 LAB — PROTIME-INR
INR: 1.92 — ABNORMAL HIGH (ref 0.00–1.49)
PROTHROMBIN TIME: 21.9 s — AB (ref 11.6–15.2)

## 2015-11-21 LAB — GLUCOSE, CAPILLARY
GLUCOSE-CAPILLARY: 76 mg/dL (ref 65–99)
GLUCOSE-CAPILLARY: 194 mg/dL — AB (ref 65–99)

## 2015-11-21 LAB — BRAIN NATRIURETIC PEPTIDE: B Natriuretic Peptide: 60.3 pg/mL (ref 0.0–100.0)

## 2015-11-21 MED ORDER — MAGNESIUM OXIDE 400 (241.3 MG) MG PO TABS
400.0000 mg | ORAL_TABLET | Freq: Two times a day (BID) | ORAL | Status: DC
Start: 2015-11-21 — End: 2015-11-23
  Administered 2015-11-21 – 2015-11-23 (×5): 400 mg via ORAL
  Filled 2015-11-21 (×5): qty 1

## 2015-11-21 MED ORDER — RIFAXIMIN 550 MG PO TABS
550.0000 mg | ORAL_TABLET | Freq: Two times a day (BID) | ORAL | Status: DC
  Administered 2015-11-21 – 2015-11-23 (×5): 550 mg via ORAL
  Filled 2015-11-21 (×5): qty 1

## 2015-11-21 MED ORDER — ONDANSETRON HCL 4 MG/2ML IJ SOLN: 4.0000 mg | Freq: Four times a day (QID) | INTRAMUSCULAR | Status: DC | PRN

## 2015-11-21 MED ORDER — LORATADINE 10 MG PO TABS
10.0000 mg | ORAL_TABLET | Freq: Every day | ORAL | Status: DC
  Administered 2015-11-21 – 2015-11-23 (×3): 10 mg via ORAL
  Filled 2015-11-21 (×3): qty 1

## 2015-11-21 MED ORDER — SODIUM CHLORIDE 0.9 % IV SOLN: Freq: Once | INTRAVENOUS | Status: DC

## 2015-11-21 MED ORDER — PHYTONADIONE 5 MG PO TABS
10.0000 mg | ORAL_TABLET | Freq: Every day | ORAL | Status: DC
  Administered 2015-11-21 – 2015-11-23 (×3): 10 mg via ORAL
  Filled 2015-11-21 (×3): qty 2

## 2015-11-21 MED ORDER — LORAZEPAM 0.5 MG PO TABS
0.5000 mg | ORAL_TABLET | Freq: Once | ORAL | Status: DC | PRN
  Filled 2015-11-21: qty 1

## 2015-11-21 MED ORDER — PANTOPRAZOLE SODIUM 40 MG PO TBEC
40.0000 mg | DELAYED_RELEASE_TABLET | Freq: Every day | ORAL | Status: DC
  Administered 2015-11-21 – 2015-11-23 (×3): 40 mg via ORAL
  Filled 2015-11-21 (×3): qty 1

## 2015-11-21 MED ORDER — TRAZODONE HCL 50 MG PO TABS
25.0000 mg | ORAL_TABLET | Freq: Every evening | ORAL | Status: DC | PRN
  Administered 2015-11-22: 50 mg via ORAL
  Filled 2015-11-21: qty 1

## 2015-11-21 MED ORDER — SPIRONOLACTONE 50 MG PO TABS
50.0000 mg | ORAL_TABLET | Freq: Two times a day (BID) | ORAL | Status: DC
  Administered 2015-11-21 – 2015-11-23 (×5): 50 mg via ORAL
  Filled 2015-11-21 (×2): qty 1
  Filled 2015-11-21 (×2): qty 2
  Filled 2015-11-21: qty 1
  Filled 2015-11-21 (×2): qty 2
  Filled 2015-11-21 (×2): qty 1
  Filled 2015-11-21: qty 2

## 2015-11-21 MED ORDER — PROMETHAZINE HCL 12.5 MG PO TABS
6.2500 mg | ORAL_TABLET | Freq: Two times a day (BID) | ORAL | Status: DC | PRN
  Filled 2015-11-21: qty 1

## 2015-11-21 MED ORDER — NITROGLYCERIN 0.4 MG SL SUBL
0.4000 mg | SUBLINGUAL_TABLET | SUBLINGUAL | Status: DC | PRN
  Administered 2015-11-21: 0.4 mg via SUBLINGUAL
  Filled 2015-11-21 (×2): qty 1

## 2015-11-21 MED ORDER — LACTULOSE 10 GM/15ML PO SOLN
20.0000 g | Freq: Three times a day (TID) | ORAL | Status: DC
  Administered 2015-11-21 – 2015-11-23 (×6): 20 g via ORAL
  Filled 2015-11-21 (×6): qty 30

## 2015-11-21 MED ORDER — FUROSEMIDE 80 MG PO TABS
80.0000 mg | ORAL_TABLET | Freq: Every day | ORAL | Status: DC
  Administered 2015-11-21 – 2015-11-23 (×3): 80 mg via ORAL
  Filled 2015-11-21 (×3): qty 1

## 2015-11-21 MED ORDER — PROMETHAZINE HCL 25 MG PO TABS: 12.5000 mg | ORAL_TABLET | Freq: Two times a day (BID) | ORAL | Status: DC | PRN

## 2015-11-21 MED ORDER — LACTULOSE 10 GM/15ML PO SOLN: 20.0000 g | Freq: Every day | ORAL | Status: DC

## 2015-11-21 MED ORDER — ASPIRIN 81 MG PO CHEW
324.0000 mg | CHEWABLE_TABLET | Freq: Once | ORAL | Status: AC
Start: 2015-11-21 — End: 2015-11-21
  Administered 2015-11-21: 324 mg via ORAL
  Filled 2015-11-21: qty 4

## 2015-11-21 MED ORDER — GI COCKTAIL ~~LOC~~: 30.0000 mL | Freq: Four times a day (QID) | ORAL | Status: DC | PRN

## 2015-11-21 NOTE — Progress Notes (Signed)
Carryover received from Dr. Casilda Carls  Colton Porter is a 50 year old male w/ pmh of alcohol liver cirrhosis with ascites, CKD stage III, HTN, HLD, GERD; who presented with complaints of left-sided chest pain while gardening. Lab work reveals hbg drop 10-> 8.7, Cr increase 1.6->2.12.   Troponin negative 2.  CXR showing mild vascular congestion. Stool occult was reported to be negative  Admitting patient for chest pain rule out and further workup of new lab abnormalities.

## 2015-11-21 NOTE — ED Notes (Signed)
With patient's consent, left message with pt's liver transplant team - pt has not been placed on transplant list but is getting prepared to be placed on list. [phone]

## 2015-11-21 NOTE — H&P (Signed)
[facility] History and Physical  Colton Porter [id number] DOB: [birth year] DOA: 11/21/2015  Referring physician: Tamala Julian PCP: Ria Bush, MD   Chief Complaint: chest pain  HPI: Colton Porter is a 50 y.o. 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 Initial evaluation encouraging and patient admitted to complete rule out.  Information is obtained from the patient and the wife who is at the bedside. Patient reports intermittent left anterior chest pain most of yesterday. Describes the pain is sharp and throbbing that began while working in the garden. He reports the pain radiated to his left shoulder. Associated symptoms include headache nausea diaphoresis shortness of breath with exertion. He also reports he's had these symptoms before when exerting himself. Wife reports over the last 2 weeks she's had mild intermittent encephalopathy as well as increasing lower extremity edema. He was seen by PCP and transplant liaison NP who increased his Lasix and started him on Rifaximin respectively. Wife reports he hasn't had a chance to start Rifaximin. He also reports he's scheduled for an outpatient MRI of the abdomen today at 2:00 as ordered by transplant team. He denies fever chills visual disturbances syncope or near-syncope. Denies orthopnea. He denies abdominal pain dysuria hematuria frequency or urgency  In the emergency department he's afebrile hemodynamically stable and not hypoxic. At the time of admission he is chest pain-free   Review of Systems:  10 point review of systems complete and all systems are negative except as indicated in the history of present illness  Past Medical History  Diagnosis Date  . Allergy   . GERD  (gastroesophageal reflux disease)   . Hyperlipidemia   . Hypertension   . Neuropathy ([place])   . Thrombocytopenia ([place]) 12/15/2011  . Chronic diastolic heart failure ([place]) 11/01/2013  . Alcohol dependence ([place]) 07/11/2012  . Alcoholic cirrhosis of liver with ascites ([place]) 07/2014  . OSA on CPAP 07/11/2012    HST 07/2013:  AHI 39/hr.    . Cellulitis of left leg   . CKD (chronic kidney disease) stage 3, GFR 30-59 ml/min 11/04/2015  . Anemia    Past Surgical History  Procedure Laterality Date  . Hand surgery  1997  . Left and right heart catheterization with coronary angiogram N/A 06/10/2013    Procedure: LEFT AND RIGHT HEART CATHETERIZATION WITH CORONARY ANGIOGRAM;  Surgeon: Blane Ohara, MD;  Location: [facility] CATH LAB;  Service: Cardiovascular;  Laterality: N/A;  . Colonoscopy  08/2013    hyperplastic polyps, hemorrhoids Ardis Hughs)  . 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 He has quit using smokeless tobacco. His smokeless tobacco use included Chew. He reports that he uses illicit drugs. He reports that he does not drink alcohol. Lives at home with his wife he is on disability he's currently being worked up for liver transplant in [place]  . Nsaids Other (See Comments)    cirrhosis  . Tylenol [Acetaminophen] Other (See Comments)    cirrhosis  . Ambien [Zolpidem Tartrate] Other (See Comments)    Over-toxicity from liver failure  . Morphine And Related     Hallucinations.  . Hydrochlorothiazide W-Triamterene Other (See Comments)    REACTION: dizzy, nausea  .  Lisinopril Other (See Comments)    REACTION: cough, decreased libido    Family History  Problem Relation Age of Onset  . Stroke Mother   . Hypertension Father   . Heart disease Father   . Lung cancer Maternal Aunt   . Emphysema Mother   . Emphysema Father   . Heart attack Neg Hx   . Stroke Paternal  Grandmother      Prior to Admission medications   Medication Sig Start Date End Date Taking? Authorizing Provider  acetaminophen (TYLENOL) 500 MG tablet Take 500 mg by mouth every 6 (six) hours as needed for mild pain.   Yes Historical Provider, MD  furosemide (LASIX) 80 MG tablet Take 1 tablet (80 mg total) by mouth daily. 11/19/15  Yes Ria Bush, MD  lactulose (CHRONULAC) 10 GM/15ML solution Take 30 mLs (20 g total) by mouth daily. 10/29/15  Yes Ria Bush, MD  loratadine (CLARITIN) 10 MG tablet Take 10 mg by mouth daily. Reported on 11/16/2015   Yes Historical Provider, MD  magnesium oxide (MAG-OX) 400 (241.3 Mg) MG tablet Take 1 tablet (400 mg total) by mouth 2 (two) times daily. 11/03/15  Yes Ria Bush, MD  Multiple Vitamin (MULTIVITAMIN WITH MINERALS) TABS tablet Take 2 tablets by mouth every evening.   Yes Historical Provider, MD  omeprazole (PRILOSEC) 40 MG capsule Take 1 capsule (40 mg total) by mouth daily. 07/24/15  Yes Ria Bush, MD  phytonadione (MEPHYTON) 5 MG tablet Take 2 tablets (10 mg total) by mouth daily. 09/21/15  Yes Ria Bush, MD  promethazine (PHENERGAN) 25 MG tablet Take 0.5-1 tablets (12.5-25 mg total) by mouth 2 (two) times daily as needed for nausea or vomiting. 11/20/15  Yes Ria Bush, MD  rifaximin (XIFAXAN) 550 MG TABS tablet Take 550 mg by mouth 2 (two) times daily.   Yes Historical Provider, MD  rizatriptan (MAXALT) 5 MG tablet Take 5 mg by mouth as needed for migraine (may repeat in 2 hours if needed for migraine relief). May repeat in 2 hours if needed   Yes Historical Provider, MD  spironolactone (ALDACTONE) 25 MG tablet Take 2 tablets (50 mg total) by mouth 2 (two) times daily. 05/07/15  Yes Dorothy Spark, MD  traZODone (DESYREL) 50 MG tablet Take 25-50 mg by mouth at bedtime as needed for sleep.   Yes Historical Provider, MD   Physical Exam: Filed Vitals:   11/21/15 0400 11/21/15 0500 11/21/15 0600 11/21/15 0747  BP:  97/48 104/60 103/55 129/60  Pulse: 83 81 80 79  Temp:    98.4 F (36.9 C)  TempSrc:    Oral  Resp: 17 16 17 18   Height:    5\' 7"  (1.702 m)  Weight:    123.106 kg (271 lb 6.4 oz)  SpO2: 95% 100% 99% 100%    Wt Readings from Last 3 Encounters:  11/21/15 123.106 kg (271 lb 6.4 oz)  11/16/15 128.935 kg (284 lb 4 oz)  11/03/15 124.966 kg (275 lb 8 oz)    General:  Appears Somewhat lethargic jaundiced but comfortable Eyes: PERRL, normal lids, +scleral icterus ENT: grossly normal hearing, lips & tongue is membranes of his mouth are slightly pale somewhat dry Neck: no LAD, masses or thyromegaly Cardiovascular: RRR, +murmur. 2+ LE edema especially feet, up to knees. Non-tender. Increased breast tissue Respiratory: CTA bilaterally, no w/r/r. Normal respiratory effort. Abdomen: soft, ntnd Skin: no rash or induration seen on limited exam Musculoskeletal: grossly normal tone BUE/BLE, joints without swelling/erythema Psychiatric: grossly normal  mood and affect, speech fluent and appropriate Neurologic: grossly non-focal. Lightly lethargic but arouses to verbal stimuli responds to questions and commands appropriately oriented to self and place and time is somewhat slow to respond           Labs on Admission:  Basic Metabolic Panel:  Recent Labs Lab 11/16/15 1341 11/20/15 2204 11/21/15 0545  NA 127* 132* 133*  K 5.3* 4.4 4.8  CL 103 101 100*  CO2 21 23 23   GLUCOSE 118* 152* 86  BUN 23 25* 29*  CREATININE 1.67* 2.12* 2.12*  CALCIUM 8.1* 8.8* 8.9   Liver Function Tests:  Recent Labs Lab 11/16/15 1341  AST 50*  ALT 32  ALKPHOS 181*  BILITOT 4.2*  PROT 6.4  ALBUMIN 2.5*   No results for input(s): LIPASE, AMYLASE in the last 168 hours. No results for input(s): AMMONIA in the last 168 hours. CBC:  Recent Labs Lab 11/20/15 2204 11/21/15 0545  WBC 4.3 3.8*  NEUTROABS  --  2.1  HGB 8.7* 8.1*  HCT 25.6* 23.9*  MCV 99.6 99.6  PLT 44* 41*   Cardiac Enzymes:  Recent  Labs Lab 11/16/15 1341 11/21/15 0220 11/21/15 0545  CKTOTAL 150  --   --   TROPONINI  --  0.03 0.03    BNP (last 3 results)  Recent Labs  05/18/15 1017 05/27/15 1503 06/05/15 1411  BNP 85.1 172.1* 73.9    ProBNP (last 3 results) No results for input(s): PROBNP in the last 8760 hours.  CBG: No results for input(s): GLUCAP in the last 168 hours.  Radiological Exams on Admission: Dg Chest 2 View  11/20/2015  CLINICAL DATA:  Acute onset of left-sided chest pain and shortness of breath. Sluggishness. Initial encounter. EXAM: CHEST  2 VIEW COMPARISON:  Chest radiograph performed 06/09/2015 FINDINGS: The lungs are well-aerated. Mild vascular congestion is noted. Minimal bibasilar atelectasis is seen. There is no evidence of pleural effusion or pneumothorax. The heart is normal in size; the mediastinal contour is within normal limits. No acute osseous abnormalities are seen. IMPRESSION: Minimal bibasilar atelectasis noted.  Mild vascular congestion seen. Electronically Signed   By: Garald Balding M.D.   On: 11/20/2015 23:50    EKG: Independently reviewed  Assessment/Plan Principal Problem:   Chest pain Active Problems:   HLD (hyperlipidemia)   GERD   Morbid obesity with BMI of 40.0-44.9, adult (HCC)   Thrombocytopenia (HCC)   History of alcohol dependence ([place])   Essential hypertension   Chronic diastolic heart failure (HCC)   Alcoholic cirrhosis of liver without ascites (HCC)   Acute renal failure superimposed on stage 3 chronic kidney disease (HCC)   Hyperglycemia   Encephalopathy  #1. Chest pain. Some atypical and typical features. Heart score 5. History of chronic diastolic heart failure, hyperlipidemia, hypertension. Initial troponins negative 2. EKG pending. Chest x-ray without daily abnormalities mild vascular congestion. May be related to musculoskeletal strain after a day of gardening. Patient is scheduled to see cardiology in 2 days for stress echo in preparation  for liver transplant. -Admit to telemetry -Continue troponin cycle -Serial EKGs -Obtain lipid panel -Supportive therapy -Discharge includes afternoon once he rules out to avoid interruption in preliver transplant workup that is scheduled  #2. Acute renal failure superimposed on stage III chronic kidney disease. Creatinine on admission 2.2. Increase from 1.6 several days ago. Chart review indicates Lasix increased due to gradual worsening of lower extremity edema. -Monitor urine output -hold nephrotoxins as able -Monitor closely  #3. Chronic  diastolic heart failure. See #1. Echo done last year revealed an EF of 55%.  -Continue home Lasix -Monitor intake and output -Daily weights -Continue home meds -Patient has an appointment in 2 days for a stress echo to be done by cardiologist in [place]  #4. Encephalopathy. 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 Alcohol cirrhosis of liver without ascites. Recent bilirubin 4.2. Platelets 42 which is close to his baseline. Other labs reflect liver disease trending up slightly. Chart review indicates most recent meld score 28. Currently in the middle of preliver transplant workup. Scheduled for MRI outpatient 2:00 this afternoon. Has appointment for stress echo in 2 days. -Continue rifaximin and spironolactone -Obtain ammonia level -We will go ahead and get MRI of the abdomen as he will likely miss his appointment in the outpatient setting  #6. Thrombocytopenia. Platelets are 41. Chart review indicates this is close to his baseline. no Signs symptoms of active bleeding. INR 1.9 -Monitor  7. Hypertension. Control in the emergency department. Tends to be somewhat on the soft side. Home medications include Lasix. Spironolactone. - continue home meds -monitor    Code Status: full DVT Prophylaxis: Family  Communication: wife at bedside Disposition Plan: home hopefully today  Time spent: 68 minutes  [facility]

## 2015-11-21 NOTE — ED Provider Notes (Addendum)
CSN: [id number]     Arrival date & time 11/20/15  2243 History  By signing my name below, I, Stephania Fragmin, attest that this documentation has been prepared under the direction and in the presence of Varney Biles, MD. Electronically Signed: Stephania Fragmin, ED Scribe. 11/21/2015. 3:29 AM.    Chief Complaint  Patient presents with  . Chest Pain   The history is provided by the patient and a relative. No language interpreter was used.   HPI Comments: Colton Porter is a 50 y.o. male with a history of HLD, GERD, alcoholic cirrhosis of liver with ascites due to alcohol dependence, CKD stage 3, who presents to the Emergency Department complaining of sharp, throbbing, left-sided chest pain that began tonight while working in his garden, an exertional activity. He also complains of an associated headache, nausea, clamminess, and aching left shoulder pain. He denies any known trigger of his left shoulder pain; he denies any recent trauma, injury, or heavy lifting. Patient states he experiences the same symptoms, occurring about once a month, triggered only when he does something exertional. His wife states patient sees Dr. Adonis Huguenin at the [facility]. Patient's wife reports a history of cirrhosis due to EtOH consumption; she states he is not currently on a liver transplant list but is getting prepared to be placed on the list. She reports he is currently abstinent from EtOH consumption. She states patient has also been prescribed rifaximin for encephalopathy in the past week. Patient denies a history of smoking or cocaine use. He also denies a personal history of MI or DM. He reports he had a prior history of hypertension that is controlled, and has not had to take hypertension medications for the past year. He states his mother had a history of CHF, and his father had a history of emphysema. He denies numbness, tingling, melena, or hematochezia. Patient states he has recently lost 8 lbs of fluid after taking Lasix.  Patient reports a history of prior rectal bleeding but attributed these to hemorrhoids.  Per chart review, a colonoscopy on 09/03/13 showed that "Three polyps were found, removed and sent to pathology. There were small internal and external hemorrhoids. These are the likely source of... minor intermittent rectal bleeding." An endoscopy done on 10/13/14 showed that "There was mild pangastric Portal Gastropathy changes.The examination was otherwise normal; no esophageal or gastric varices."   Past Medical History  Diagnosis Date  . Allergy   . GERD (gastroesophageal reflux disease)   . Hyperlipidemia   . Hypertension   . Neuropathy ([place])   . Thrombocytopenia ([place]) 12/15/2011  . Chronic diastolic heart failure ([place]) 11/01/2013  . Alcohol dependence ([place]) 07/11/2012  . Alcoholic cirrhosis of liver with ascites ([place]) 07/2014  . OSA on CPAP 07/11/2012    HST 07/2013:  AHI 39/hr.    . Cellulitis of left leg   . CKD (chronic kidney disease) stage 3, GFR 30-59 ml/min 11/04/2015   Past Surgical History  Procedure Laterality Date  . Hand surgery  1997  . Left and right heart catheterization with coronary angiogram N/A 06/10/2013    Procedure: LEFT AND RIGHT HEART CATHETERIZATION WITH CORONARY ANGIOGRAM;  Surgeon: Blane Ohara, MD;  Location: [facility] CATH LAB;  Service: Cardiovascular;  Laterality: N/A;  . Colonoscopy  08/2013    hyperplastic polyps, hemorrhoids Ardis Hughs)  . 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 Stroke Mother   .  Hypertension Father   . Heart disease Father   . Lung cancer Maternal Aunt   . Emphysema Mother   . Emphysema Father   . Heart attack Neg Hx   . Stroke Paternal Grandmother    Social History  Substance Use Topics  . Smoking status: Never Smoker   . Smokeless tobacco: Former Systems developer    Types: Chew  . Alcohol Use: No     Comment: 4-6 drinks daily - NO ETOH SINCE APRIL 2016     Review of Systems  Cardiovascular: Positive for chest pain.  Gastrointestinal: Positive for nausea. Negative for blood in stool.  Musculoskeletal: Positive for arthralgias (left shoulder pain).  Neurological: Positive for headaches. Negative for numbness.  All other systems reviewed and are negative.   Allergies  Nsaids; Tylenol; Ambien; Morphine and related; Hydrochlorothiazide w-triamterene; and Lisinopril  Home Medications   Prior to Admission medications   Medication Sig Start Date End Date Taking? Authorizing Provider  acetaminophen (TYLENOL) 500 MG tablet Take 500 mg by mouth every 6 (six) hours as needed for mild pain.   Yes Historical Provider, MD  furosemide (LASIX) 80 MG tablet Take 1 tablet (80 mg total) by mouth daily. 11/19/15  Yes Ria Bush, MD  lactulose (CHRONULAC) 10 GM/15ML solution Take 30 mLs (20 g total) by mouth daily. 10/29/15  Yes Ria Bush, MD  loratadine (CLARITIN) 10 MG tablet Take 10 mg by mouth daily. Reported on 11/16/2015   Yes Historical Provider, MD  magnesium oxide (MAG-OX) 400 (241.3 Mg) MG tablet Take 1 tablet (400 mg total) by mouth 2 (two) times daily. 11/03/15  Yes Ria Bush, MD  Multiple Vitamin (MULTIVITAMIN WITH MINERALS) TABS tablet Take 2 tablets by mouth every evening.   Yes Historical Provider, MD  omeprazole (PRILOSEC) 40 MG capsule Take 1 capsule (40 mg total) by mouth daily. 07/24/15  Yes Ria Bush, MD  phytonadione (MEPHYTON) 5 MG tablet Take 2 tablets (10 mg total) by mouth daily. 09/21/15  Yes Ria Bush, MD  promethazine (PHENERGAN) 25 MG tablet Take 0.5-1 tablets (12.5-25 mg total) by mouth 2 (two) times daily as needed for nausea or vomiting. 11/20/15  Yes Ria Bush, MD  rifaximin (XIFAXAN) 550 MG TABS tablet Take 550 mg by mouth 2 (two) times daily.   Yes Historical Provider, MD  rizatriptan (MAXALT) 5 MG tablet Take 5 mg by mouth as needed for migraine (may repeat in 2 hours if needed for migraine  relief). May repeat in 2 hours if needed   Yes Historical Provider, MD  spironolactone (ALDACTONE) 25 MG tablet Take 2 tablets (50 mg total) by mouth 2 (two) times daily. 05/07/15  Yes Dorothy Spark, MD  traZODone (DESYREL) 50 MG tablet Take 25-50 mg by mouth at bedtime as needed for sleep.   Yes Historical Provider, MD   BP 102/54 mmHg  Pulse 80  Temp(Src) 98.4 F (36.9 C) (Oral)  Resp 21  SpO2 98% Physical Exam  Constitutional: He is oriented to person, place, and time. He appears well-developed and well-nourished. No distress.  HENT:  Head: Normocephalic and atraumatic.  Eyes: Conjunctivae and EOM are normal.  Neck: Neck supple. JVD present. No tracheal deviation present.  Cardiovascular: Normal rate.   Murmur heard. RRR, with a systolic murmur. Jugular vein is distended.  Pulmonary/Chest: Effort normal. No respiratory distress.  No appreciable chest wall tenderness.   Abdominal: Soft. There is no tenderness.  Abdomen is soft. No CVA tenderness to palpation.   Genitourinary:  No melena  on rectal exam. No large hemorrhoids.   Musculoskeletal: Normal range of motion.  Neurological: He is alert and oriented to person, place, and time.  Skin: Skin is warm and dry.  Psychiatric: He has a normal mood and affect. His behavior is normal.  Nursing note and vitals reviewed.   ED Course  Procedures (including critical care time)  DIAGNOSTIC STUDIES: Oxygen Saturation is 100% on RA, normal by my interpretation.    COORDINATION OF CARE: 1:54 AM - Discussed treatment plan with pt at bedside which includes administration of NTG. Pt verbalized understanding and agreed to plan.   3:28 AM - Chest pain-free post NTG. Ready for admission.  Labs Review Labs Reviewed  BASIC METABOLIC PANEL - Abnormal; Notable for the following:    Sodium 132 (*)    Glucose, Bld 152 (*)    BUN 25 (*)    Creatinine, Ser 2.12 (*)    Calcium 8.8 (*)    GFR calc non Af Amer 35 (*)    GFR calc Af Amer  40 (*)    All other components within normal limits  CBC - Abnormal; Notable for the following:    RBC 2.57 (*)    Hemoglobin 8.7 (*)    HCT 25.6 (*)    Platelets 44 (*)    All other components within normal limits  PROTIME-INR - Abnormal; Notable for the following:    Prothrombin Time 21.9 (*)    INR 1.92 (*)    All other components within normal limits  TROPONIN I  OCCULT BLOOD X 1 CARD TO LAB, STOOL  I-STAT TROPOININ, ED    Imaging Review Dg Chest 2 View  11/20/2015  CLINICAL DATA:  Acute onset of left-sided chest pain and shortness of breath. Sluggishness. Initial encounter. EXAM: CHEST  2 VIEW COMPARISON:  Chest radiograph performed 06/09/2015 FINDINGS: The lungs are well-aerated. Mild vascular congestion is noted. Minimal bibasilar atelectasis is seen. There is no evidence of pleural effusion or pneumothorax. The heart is normal in size; the mediastinal contour is within normal limits. No acute osseous abnormalities are seen. IMPRESSION: Minimal bibasilar atelectasis noted.  Mild vascular congestion seen. Electronically Signed   By: Garald Balding M.D.   On: 11/20/2015 23:50   I have personally reviewed and evaluated these images and lab results as part of my medical decision-making.   MDM   Final diagnoses:  Angina pectoris ([place])    Pt comes in with chest pain that is concerning of nature. He has HTN, HL, obesity. Also has liver cirrhosis. No cardiac hx, but the chest pain is concerning. We will get trop. EKG is reassuring. We will admit for ACS r/o.  Chest pain free post nitro.    Varney Biles, MD 11/21/15 [place], MD 11/21/15 [phone]

## 2015-11-22 ENCOUNTER — Observation Stay (HOSPITAL_COMMUNITY): Payer: BLUE CROSS/BLUE SHIELD

## 2015-11-22 ENCOUNTER — Observation Stay (HOSPITAL_BASED_OUTPATIENT_CLINIC_OR_DEPARTMENT_OTHER): Payer: BLUE CROSS/BLUE SHIELD

## 2015-11-22 ENCOUNTER — Other Ambulatory Visit: Payer: Self-pay

## 2015-11-22 DIAGNOSIS — R072 Precordial pain: Secondary | ICD-10-CM

## 2015-11-22 DIAGNOSIS — K7030 Alcoholic cirrhosis of liver without ascites: Secondary | ICD-10-CM

## 2015-11-22 DIAGNOSIS — R0602 Shortness of breath: Secondary | ICD-10-CM | POA: Diagnosis not present

## 2015-11-22 DIAGNOSIS — R0789 Other chest pain: Secondary | ICD-10-CM | POA: Diagnosis not present

## 2015-11-22 DIAGNOSIS — N183 Chronic kidney disease, stage 3 (moderate): Secondary | ICD-10-CM

## 2015-11-22 DIAGNOSIS — E785 Hyperlipidemia, unspecified: Secondary | ICD-10-CM | POA: Diagnosis not present

## 2015-11-22 DIAGNOSIS — N179 Acute kidney failure, unspecified: Secondary | ICD-10-CM | POA: Diagnosis not present

## 2015-11-22 DIAGNOSIS — K219 Gastro-esophageal reflux disease without esophagitis: Secondary | ICD-10-CM | POA: Diagnosis not present

## 2015-11-22 LAB — CBC
HEMATOCRIT: 24.3 % — AB (ref 39.0–52.0)
HEMOGLOBIN: 8.2 g/dL — AB (ref 13.0–17.0)
MCH: 33.9 pg (ref 26.0–34.0)
MCHC: 33.7 g/dL (ref 30.0–36.0)
MCV: 100.4 fL — ABNORMAL HIGH (ref 78.0–100.0)
PLATELETS: 35 10*3/uL — AB (ref 150–400)
RBC: 2.42 MIL/uL — ABNORMAL LOW (ref 4.22–5.81)
RDW: 15.4 % (ref 11.5–15.5)
WBC: 3.4 10*3/uL — ABNORMAL LOW (ref 4.0–10.5)

## 2015-11-22 LAB — GLUCOSE, CAPILLARY: Glucose-Capillary: 83 mg/dL (ref 65–99)

## 2015-11-22 LAB — D-DIMER, QUANTITATIVE (NOT AT ARMC): D DIMER QUANT: 5.33 ug{FEU}/mL — AB (ref 0.00–0.50)

## 2015-11-22 LAB — OCCULT BLOOD X 1 CARD TO LAB, STOOL: Fecal Occult Bld: POSITIVE — AB

## 2015-11-22 MED ORDER — TRAMADOL HCL 50 MG PO TABS
50.0000 mg | ORAL_TABLET | Freq: Four times a day (QID) | ORAL | Status: DC | PRN
  Administered 2015-11-22: 50 mg via ORAL
  Filled 2015-11-22: qty 1

## 2015-11-22 MED ORDER — TECHNETIUM TO 99M ALBUMIN AGGREGATED
4.2000 | Freq: Once | INTRAVENOUS | Status: AC | PRN
  Administered 2015-11-22: 4 via INTRAVENOUS

## 2015-11-22 MED ORDER — MORPHINE SULFATE (PF) 2 MG/ML IV SOLN: 1.0000 mg | Freq: Four times a day (QID) | INTRAVENOUS | Status: DC | PRN

## 2015-11-22 MED ORDER — TECHNETIUM TC 99M DIETHYLENETRIAME-PENTAACETIC ACID: 30.1000 | Freq: Once | INTRAVENOUS | Status: DC | PRN

## 2015-11-22 NOTE — Progress Notes (Signed)
[facility] PROGRESS NOTE  Colton Porter [id number] DOB: 22-Dec-1965 DOA: 11/21/2015   PCP: Ria Bush, MD     Assessment/Plan: Principal Problem:   Chest pain Active Problems:   HLD (hyperlipidemia)   GERD   Morbid obesity with BMI of 40.0-44.9, adult (HCC)   Thrombocytopenia (HCC)   History of alcohol dependence ([place])   Essential hypertension   Chronic diastolic heart failure (HCC)   Alcoholic cirrhosis of liver without ascites (HCC)   Acute renal failure superimposed on stage 3 chronic kidney disease (HCC)   Hyperglycemia   Encephalopathy   Gynecomastia   50 y.o. 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 Patient seen by PCP on 5/22 for worsening leg swelling and weight gain. PCP increased diuretics. Patient is followed by [facility] ([name]/Dr Coralyn Pear) and is getting ready to be placed on the transplant list.. F/u appt with transplant clinic June 5th  Assessment and plan  #1. Chest pain. Some atypical and typical features. Heart score 5. History of chronic diastolic heart failure, hyperlipidemia, hypertension. Cardiac enzymes negative. EKG normal sinus rhythm. Chest x-ray without daily abnormalities mild vascular congestion. May be related to musculoskeletal strain after a day of gardening. Patient is scheduled to see cardiology in 2 days for stress echo in preparation for liver transplant. Telemetry uneventful Recent lipid panel showed LDL 116, triglycerides 130, total cholesterol 179 2-D echo to rule out wall motion abnormalities, d-dimer to rule out possibility of PE/DVT Not a candidate for aspirin given low platelets  #2. Acute renal failure superimposed on stage III chronic kidney disease. Creatinine on  admission 2.2. Increase from 1.6 several days ago due to increase in diuretics. Chart review indicates Lasix increased due to gradual worsening of lower extremity edema. -Monitor urine output -hold nephrotoxins as able -Monitor closely  #3. Chronic diastolic heart failure. See #1. Echo done last year revealed an EF of 55%.  -Continue Lasix 80 mg a day, follow renal function, continue Aldactone -Patient has an appointment in 2 days for a stress echo to be done by cardiologist in [place]   #4. Encephalopathy. 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 - rifaxamin -Continue other home medications  5. Alcohol cirrhosis of liver without ascites. Recent bilirubin 4.2. Platelets 42 which is close to his baseline. Other labs reflect liver disease trending up slightly. Chart review indicates most recent meld score 28. Currently in the middle of preliver transplant workup. Scheduled for MRI outpatient ,attempted but failed to complete this yesterday ,therefore need to schedule outpt MRI . Has appointment for stress echo in 2 days. -Continue rifaximin and lactulose  MRI of the abdomen which the patient was scheduled to have in the outpatient setting as a part of his pretransplant workup   #6. Thrombocytopenia. Platelets are 35. Chart review indicates this is close to his baseline. no Signs symptoms of active bleeding. INR 1.9 -Monitor  7. Hypertension. Control in the emergency department. Tends to be somewhat on the soft side. Home medications include Lasix. Spironolactone. - continue home meds -monitor    DVT prophylaxsis SCDs  Code Status:  Full code    Family Communication: Discussed in detail with the patient, all imaging  results, lab results explained to the patient   Disposition Plan: Anticipate discharge tomorrow       Consultants:  None  Procedures: None  Antibiotics: Anti-infectives    Start     Dose/Rate Route Frequency Ordered Stop   11/21/15 1000  rifaximin (XIFAXAN) tablet 550 mg     550 mg Oral 2 times daily 11/21/15 0715           HPI/Subjective: Chest pain free this morning , wife by the bedside   Objective: Filed Vitals:   11/21/15 1229 11/21/15 2021 11/22/15 0027 11/22/15 0500  BP: 114/39 125/55 112/42 121/47  Pulse: 81 81 79 80  Temp: 98.1 F (36.7 C) 98.4 F (36.9 C) 98.4 F (36.9 C) 98.2 F (36.8 C)  TempSrc: Oral Oral Oral Oral  Resp: 18 18 18 18   Height:      Weight:    120.112 kg (264 lb 12.8 oz)  SpO2: 100% 100% 100% 100%    Intake/Output Summary (Last 24 hours) at 11/22/15 0842 Last data filed at 11/22/15 0200  Gross per 24 hour  Intake    560 ml  Output   2000 ml  Net  -1440 ml    Exam:  Examination:  General exam: Appears calm and comfortable  Respiratory system: Clear to auscultation. Respiratory effort normal. Cardiovascular system: S1 & S2 heard, RRR. No JVD, murmurs, rubs, gallops or clicks. No pedal edema. Gastrointestinal system: Abdomen is nondistended, soft and nontender. No organomegaly or masses felt. Normal bowel sounds heard. Central nervous system: Alert and oriented. No focal neurological deficits. Extremities: Symmetric 5 x 5 power. Skin: No rashes, lesions or ulcers Psychiatry: Judgement and insight appear normal. Mood & affect appropriate.     Data Reviewed: I have personally reviewed following labs and imaging studies  Micro Results No results found for this or any previous visit (from the past 240 hour(s)).  Radiology Reports Dg Chest 2 View  11/20/2015  CLINICAL DATA:  Acute onset of left-sided chest pain and shortness of breath. Sluggishness. Initial encounter. EXAM: CHEST  2 VIEW COMPARISON:  Chest radiograph performed 06/09/2015 FINDINGS: The lungs are well-aerated. Mild vascular congestion is noted. Minimal bibasilar  atelectasis is seen. There is no evidence of pleural effusion or pneumothorax. The heart is normal in size; the mediastinal contour is within normal limits. No acute osseous abnormalities are seen. IMPRESSION: Minimal bibasilar atelectasis noted.  Mild vascular congestion seen. Electronically Signed   By: Garald Balding M.D.   On: 11/20/2015 23:50     CBC  Recent Labs Lab 11/20/15 2204 11/21/15 0545 11/22/15 0504  WBC 4.3 3.8* 3.4*  HGB 8.7* 8.1* 8.2*  HCT 25.6* 23.9* 24.3*  PLT 44* 41* 35*  MCV 99.6 99.6 100.4*  MCH 33.9 33.8 33.9  MCHC 34.0 33.9 33.7  RDW 15.5 15.5 15.4  LYMPHSABS  --  1.2  --   MONOABS  --  0.4  --   EOSABS  --  0.1  --   BASOSABS  --  0.0  --     Chemistries   Recent Labs Lab 11/16/15 1341 11/20/15 2204 11/21/15 0545  NA 127* 132* 133*  K 5.3* 4.4 4.8  CL 103 101 100*  CO2 21 23 23   GLUCOSE 118* 152* 86  BUN 23 25* 29*  CREATININE 1.67* 2.12* 2.12*  CALCIUM 8.1* 8.8* 8.9  AST 50*  --   --   ALT 32  --   --   ALKPHOS 181*  --   --  BILITOT 4.2*  --   --    ------------------------------------------------------------------------------------------------------------------ estimated creatinine clearance is 52.3 mL/min (by C-G formula based on Cr of 2.12). ------------------------------------------------------------------------------------------------------------------ No results for input(s): HGBA1C in the last 72 hours. ------------------------------------------------------------------------------------------------------------------ No results for input(s): CHOL, HDL, LDLCALC, TRIG, CHOLHDL, LDLDIRECT in the last 72 hours. ------------------------------------------------------------------------------------------------------------------ No results for input(s): TSH, T4TOTAL, T3FREE, THYROIDAB in the last 72 hours.  Invalid input(s):  FREET3 ------------------------------------------------------------------------------------------------------------------ No results for input(s): VITAMINB12, FOLATE, FERRITIN, TIBC, IRON, RETICCTPCT in the last 72 hours.  Coagulation profile  Recent Labs Lab 11/16/15 1341 11/21/15 0220  INR 1.7* 1.92*    No results for input(s): DDIMER in the last 72 hours.  Cardiac Enzymes  Recent Labs Lab 11/21/15 0545 11/21/15 0803 11/21/15 1225  TROPONINI 0.03 0.03 <0.03   ------------------------------------------------------------------------------------------------------------------ Invalid input(s): POCBNP   CBG:  Recent Labs Lab 11/21/15 1223 11/21/15 1621 11/22/15 0612  GLUCAP 76 194* 83       Studies: Dg Chest 2 View  11/20/2015  CLINICAL DATA:  Acute onset of left-sided chest pain and shortness of breath. Sluggishness. Initial encounter. EXAM: CHEST  2 VIEW COMPARISON:  Chest radiograph performed 06/09/2015 FINDINGS: The lungs are well-aerated. Mild vascular congestion is noted. Minimal bibasilar atelectasis is seen. There is no evidence of pleural effusion or pneumothorax. The heart is normal in size; the mediastinal contour is within normal limits. No acute osseous abnormalities are seen. IMPRESSION: Minimal bibasilar atelectasis noted.  Mild vascular congestion seen. Electronically Signed   By: Garald Balding M.D.   On: 11/20/2015 23:50      Lab Results  Component Value Date   HGBA1C 5.5 10/17/2013   Lab Results  Component Value Date   MICROALBUR <0.7 08/27/2014   LDLCALC 116* 09/07/2015   CREATININE 2.12* 11/21/2015       Scheduled Meds: . furosemide  80 mg Oral Daily  . lactulose  20 g Oral TID  . loratadine  10 mg Oral Daily  . magnesium oxide  400 mg Oral BID  . pantoprazole  40 mg Oral Daily  . phytonadione  10 mg Oral Daily  . rifaximin  550 mg Oral BID  . spironolactone  50 mg Oral BID   Continuous Infusions:    LOS: 1 day    Time  spent: >30 MINS    [facility]  [facility] Pager [phone]. If 7PM-7AM, please contact night-coverage at [email or web address], password [email or web address] 11/22/2015, 8:42 AM  LOS: 1 day

## 2015-11-22 NOTE — Progress Notes (Signed)
Pt uses c-pap at home but needs a new mask, may need order to use in the hospital, thanks

## 2015-11-22 NOTE — Progress Notes (Signed)
*  PRELIMINARY RESULTS* Vascular Ultrasound Lower extremity venous duplex has been completed.  Preliminary findings: No evidence of DVT or baker's cyst.   Landry Mellow, RDMS, RVT  11/22/2015, 11:34 AM

## 2015-11-23 DIAGNOSIS — K7030 Alcoholic cirrhosis of liver without ascites: Secondary | ICD-10-CM | POA: Diagnosis not present

## 2015-11-23 DIAGNOSIS — R072 Precordial pain: Secondary | ICD-10-CM | POA: Diagnosis not present

## 2015-11-23 DIAGNOSIS — N183 Chronic kidney disease, stage 3 (moderate): Secondary | ICD-10-CM | POA: Diagnosis not present

## 2015-11-23 DIAGNOSIS — N179 Acute kidney failure, unspecified: Secondary | ICD-10-CM | POA: Diagnosis not present

## 2015-11-23 LAB — CBC
HCT: 23.8 % — ABNORMAL LOW (ref 39.0–52.0)
HEMOGLOBIN: 8 g/dL — AB (ref 13.0–17.0)
MCH: 33.6 pg (ref 26.0–34.0)
MCHC: 33.6 g/dL (ref 30.0–36.0)
MCV: 100 fL (ref 78.0–100.0)
PLATELETS: 35 10*3/uL — AB (ref 150–400)
RBC: 2.38 MIL/uL — AB (ref 4.22–5.81)
RDW: 15.4 % (ref 11.5–15.5)
WBC: 4 10*3/uL (ref 4.0–10.5)

## 2015-11-23 LAB — COMPREHENSIVE METABOLIC PANEL
ALBUMIN: 2 g/dL — AB (ref 3.5–5.0)
ALK PHOS: 141 U/L — AB (ref 38–126)
ALT: 29 U/L (ref 17–63)
AST: 50 U/L — AB (ref 15–41)
Anion gap: 6 (ref 5–15)
BUN: 23 mg/dL — ABNORMAL HIGH (ref 6–20)
CALCIUM: 8.8 mg/dL — AB (ref 8.9–10.3)
CHLORIDE: 99 mmol/L — AB (ref 101–111)
CO2: 27 mmol/L (ref 22–32)
CREATININE: 1.86 mg/dL — AB (ref 0.61–1.24)
GFR calc non Af Amer: 41 mL/min — ABNORMAL LOW (ref >60)
GFR, EST AFRICAN AMERICAN: 47 mL/min — AB (ref >60)
GLUCOSE: 110 mg/dL — AB (ref 65–99)
Potassium: 4.4 mmol/L (ref 3.5–5.1)
SODIUM: 132 mmol/L — AB (ref 135–145)
Total Bilirubin: 3.2 mg/dL — ABNORMAL HIGH (ref 0.3–1.2)
Total Protein: 5.9 g/dL — ABNORMAL LOW (ref 6.5–8.1)

## 2015-11-23 LAB — AMMONIA: Ammonia: 66 umol/L — ABNORMAL HIGH (ref 9–35)

## 2015-11-23 MED ORDER — LACTULOSE 10 GM/15ML PO SOLN: 20.0000 g | Freq: Two times a day (BID) | ORAL | Status: DC

## 2015-11-23 MED ORDER — LACTULOSE 10 GM/15ML PO SOLN: 20.0000 g | Freq: Three times a day (TID) | ORAL | Status: DC

## 2015-11-23 MED ORDER — SPIRONOLACTONE 25 MG PO TABS: 25.0000 mg | ORAL_TABLET | Freq: Every day | ORAL | Status: DC

## 2015-11-23 MED ORDER — TRAMADOL HCL 50 MG PO TABS: 50.0000 mg | ORAL_TABLET | Freq: Four times a day (QID) | ORAL | Status: DC | PRN

## 2015-11-23 NOTE — Care Management Note (Signed)
Case Management Note  Patient Details  Name: Colton Porter MRN: [id number] Date of Birth: [birth year]  Subjective/Objective:                    Action/Plan: Pt discharging home with self care. No further needs per CM.   Expected Discharge Date:                  Expected Discharge Plan:  Home/Self Care  In-House Referral:     Discharge planning Services     Post Acute Care Choice:    Choice offered to:     DME Arranged:    DME Agency:     HH Arranged:    [place] Agency:     Status of Service:  Completed, signed off  Medicare Important Message Given:    Date Medicare IM Given:    Medicare IM give by:    Date Additional Medicare IM Given:    Additional Medicare Important Message give by:     If discussed at [place] of Stay Meetings, dates discussed:    Additional Comments:  Pollie Friar, RN 11/23/2015, 9:23 AM

## 2015-11-23 NOTE — Discharge Summary (Addendum)
Physician Discharge Summary  Colton Porter MRN: [id number] DOB/AGE: Jul 06, 1965 50 y.o.  PCP: Ria Bush, MD   Admit date: 11/21/2015 Discharge date: 11/23/2015  Discharge Diagnoses:     Principal Problem:   Chest pain Active Problems:   HLD (hyperlipidemia)   GERD   Morbid obesity with BMI of 40.0-44.9, adult (HCC)   Thrombocytopenia (HCC)   History of alcohol dependence ([place])   Essential hypertension   Chronic diastolic heart failure (HCC)   Alcoholic cirrhosis of liver without ascites (HCC)   Acute renal failure superimposed on stage 3 chronic kidney disease (HCC)   Hyperglycemia   Encephalopathy   Gynecomastia    Follow-up recommendations Follow-up with PCP in 3-5 days , including all  additional recommended appointments as below Follow-up CBC, CMP in 3-5 days Patient patient to complete his outpatient MRI and stress test scheduled for this week       Current Discharge Medication List    START taking these medications   Details  traMADol (ULTRAM) 50 MG tablet Take 1 tablet (50 mg total) by mouth every 6 (six) hours as needed for moderate pain. Qty: 30 tablet, Refills: 0      CONTINUE these medications which have CHANGED   Details  lactulose (CHRONULAC) 10 GM/15ML solution Take 30 mLs (20 g total) by mouth 3 (three) times daily. Qty: 946 mL, Refills: 0      CONTINUE these medications which have NOT CHANGED   Details  acetaminophen (TYLENOL) 500 MG tablet Take 500 mg by mouth every 6 (six) hours as needed for mild pain.    furosemide (LASIX) 80 MG tablet Take 1 tablet (80 mg total) by mouth daily. Qty: 30 tablet, Refills: 6   Associated Diagnoses: Chronic diastolic heart failure (HCC)    loratadine (CLARITIN) 10 MG tablet Take 10 mg by mouth daily. Reported on 11/16/2015    magnesium oxide (MAG-OX) 400 (241.3 Mg) MG tablet Take 1 tablet (400 mg total) by mouth 2 (two) times daily.    Multiple Vitamin (MULTIVITAMIN WITH MINERALS) TABS tablet Take  2 tablets by mouth every evening.    omeprazole (PRILOSEC) 40 MG capsule Take 1 capsule (40 mg total) by mouth daily. Qty: 90 capsule, Refills: 1    phytonadione (MEPHYTON) 5 MG tablet Take 2 tablets (10 mg total) by mouth daily. Qty: 60 tablet, Refills: 3    promethazine (PHENERGAN) 25 MG tablet Take 0.5-1 tablets (12.5-25 mg total) by mouth 2 (two) times daily as needed for nausea or vomiting. Qty: 20 tablet, Refills: 0    rifaximin (XIFAXAN) 550 MG TABS tablet Take 550 mg by mouth 2 (two) times daily.    rizatriptan (MAXALT) 5 MG tablet Take 5 mg by mouth as needed for migraine (may repeat in 2 hours if needed for migraine relief). May repeat in 2 hours if needed    spironolactone (ALDACTONE) 25 MG tablet 25 mg daily. Qty: 360 tablet, Refills: 3   Associated Diagnoses: Chronic diastolic heart failure ([place]); Essential hypertension    traZODone (DESYREL) 50 MG tablet Take 25-50 mg by mouth at bedtime as needed for sleep.         Discharge Condition: Stable   Discharge Instructions Get Medicines reviewed and adjusted: Please take all your medications with you for your next visit with your Primary MD  Please request your Primary MD to go over all hospital tests and procedure/radiological results at the follow up, please ask your Primary MD to get all Hospital records sent to his/her  office.  If you experience worsening of your admission symptoms, develop shortness of breath, life threatening emergency, suicidal or homicidal thoughts you must seek medical attention immediately by calling 911 or calling your MD immediately if symptoms less severe.  You must read complete instructions/literature along with all the possible adverse reactions/side effects for all the Medicines you take and that have been prescribed to you. Take any new Medicines after you have completely understood and accpet all the possible adverse reactions/side effects.   Do not drive when taking Pain  medications.   Do not take more than prescribed Pain, Sleep and Anxiety Medications  Special Instructions: If you have smoked or chewed Tobacco in the last 2 yrs please stop smoking, stop any regular Alcohol and or any Recreational drug use.  Wear Seat belts while driving.  Please note  You were cared for by a hospitalist during your hospital stay. Once you are discharged, your primary care physician will handle any further medical issues. Please note that NO REFILLS for any discharge medications will be authorized once you are discharged, as it is imperative that you return to your primary care physician (or establish a relationship with a primary care physician if you do not have one) for your aftercare needs so that they can reassess your need for medications and monitor your lab values.     Allergies  Allergen Reactions  . Nsaids Other (See Comments)    cirrhosis  . Tylenol [Acetaminophen] Other (See Comments)    cirrhosis  . Ambien [Zolpidem Tartrate] Other (See Comments)    Over-toxicity from liver failure  . Morphine And Related     Hallucinations.  . Hydrochlorothiazide W-Triamterene Other (See Comments)    REACTION: dizzy, nausea  . Lisinopril Other (See Comments)    REACTION: cough, decreased libido      Disposition: Home with wife   Consults:  None     Significant Diagnostic Studies:  Dg Chest 2 View  11/22/2015  CLINICAL DATA:  Shortness of breath and left chest pain EXAM: CHEST  2 VIEW COMPARISON:  11/20/2015 chest radiograph. FINDINGS: Stable cardiomediastinal silhouette with normal heart size. No pneumothorax. No pleural effusion. Lungs appear clear, with no acute consolidative airspace disease and no pulmonary edema. IMPRESSION: No active cardiopulmonary disease. Electronically Signed   By: Ilona Sorrel M.D.   On: 11/22/2015 13:23   Dg Chest 2 View  11/20/2015  CLINICAL DATA:  Acute onset of left-sided chest pain and shortness of breath. Sluggishness.  Initial encounter. EXAM: CHEST  2 VIEW COMPARISON:  Chest radiograph performed 06/09/2015 FINDINGS: The lungs are well-aerated. Mild vascular congestion is noted. Minimal bibasilar atelectasis is seen. There is no evidence of pleural effusion or pneumothorax. The heart is normal in size; the mediastinal contour is within normal limits. No acute osseous abnormalities are seen. IMPRESSION: Minimal bibasilar atelectasis noted.  Mild vascular congestion seen. Electronically Signed   By: Garald Balding M.D.   On: 11/20/2015 23:50   Nm Pulmonary Perf And Vent  11/22/2015  CLINICAL DATA:  Chest pain.  Cirrhosis.  Chronic kidney disease. EXAM: NUCLEAR MEDICINE VENTILATION - PERFUSION LUNG SCAN TECHNIQUE: Ventilation images were obtained in multiple projections using inhaled aerosol Tc-45mDTPA. Perfusion images were obtained in multiple projections after intravenous injection of Tc-917mAA. RADIOPHARMACEUTICALS:  30.1 mCi Technetium-9938mPA aerosol inhalation and 4.2 mCi Technetium-80m82m IV COMPARISON:  Chest radiograph 11/20/2015 FINDINGS: Ventilation: No focal ventilation defect. Perfusion: No wedge shaped peripheral perfusion defects to suggest acute pulmonary embolism.  IMPRESSION: Normal exam.  No evidence of pulmonary embolism. Electronically Signed   By: Abigail Miyamoto M.D.   On: 11/22/2015 13:04    2-D echo pending    Filed Weights   11/21/15 0747 11/22/15 0500 11/23/15 0506  Weight: 123.106 kg (271 lb 6.4 oz) 120.112 kg (264 lb 12.8 oz) 118.842 kg (262 lb)     Microbiology: No results found for this or any previous visit (from the past 240 hour(s)).     Blood Culture    Component Value Date/Time   SDES BLOOD RIGHT HAND 12/31/2014 0055   SPECREQUEST BOTTLES DRAWN AEROBIC ONLY 10CC 12/31/2014 0055   CULT NO GROWTH 5 DAYS 12/31/2014 0055   REPTSTATUS 01/05/2015 FINAL 12/31/2014 0055      Labs: Results for orders placed or performed during the hospital encounter of 11/21/15 (from the  past 48 hour(s))  Troponin I (q 6hr x 3)     Status: None   Collection Time: 11/21/15  8:03 AM  Result Value Ref Range   Troponin I 0.03 <0.031 ng/mL    Comment:        NO INDICATION OF MYOCARDIAL INJURY.   Ammonia     Status: Abnormal   Collection Time: 11/21/15  8:04 AM  Result Value Ref Range   Ammonia 79 (H) 9 - 35 umol/L  Glucose, capillary     Status: None   Collection Time: 11/21/15 12:23 PM  Result Value Ref Range   Glucose-Capillary 76 65 - 99 mg/dL  Troponin I (q 6hr x 3)     Status: None   Collection Time: 11/21/15 12:25 PM  Result Value Ref Range   Troponin I <0.03 <0.031 ng/mL    Comment:        NO INDICATION OF MYOCARDIAL INJURY.   Glucose, capillary     Status: Abnormal   Collection Time: 11/21/15  4:21 PM  Result Value Ref Range   Glucose-Capillary 194 (H) 65 - 99 mg/dL  CBC     Status: Abnormal   Collection Time: 11/22/15  5:04 AM  Result Value Ref Range   WBC 3.4 (L) 4.0 - 10.5 K/uL   RBC 2.42 (L) 4.22 - 5.81 MIL/uL   Hemoglobin 8.2 (L) 13.0 - 17.0 g/dL   HCT 24.3 (L) 39.0 - 52.0 %   MCV 100.4 (H) 78.0 - 100.0 fL   MCH 33.9 26.0 - 34.0 pg   MCHC 33.7 30.0 - 36.0 g/dL   RDW 15.4 11.5 - 15.5 %   Platelets 35 (L) 150 - 400 K/uL    Comment: CONSISTENT WITH PREVIOUS RESULT  Glucose, capillary     Status: None   Collection Time: 11/22/15  6:12 AM  Result Value Ref Range   Glucose-Capillary 83 65 - 99 mg/dL  Occult blood card to lab, stool RN will collect     Status: Abnormal   Collection Time: 11/22/15  8:29 AM  Result Value Ref Range   Fecal Occult Bld POSITIVE (A) NEGATIVE  D-dimer, quantitative (not at [facility])     Status: Abnormal   Collection Time: 11/22/15  8:49 AM  Result Value Ref Range   D-Dimer, Quant 5.33 (H) 0.00 - 0.50 ug/mL-FEU    Comment: (NOTE) At the manufacturer cut-off of 0.50 ug/mL FEU, this assay has been documented to exclude PE with a sensitivity and negative predictive value of 97 to 99%.  At this time, this assay has not been  approved by the FDA to exclude DVT/VTE. Results should be correlated  with clinical presentation.   CBC     Status: Abnormal   Collection Time: 11/23/15  4:00 AM  Result Value Ref Range   WBC 4.0 4.0 - 10.5 K/uL   RBC 2.38 (L) 4.22 - 5.81 MIL/uL   Hemoglobin 8.0 (L) 13.0 - 17.0 g/dL   HCT 23.8 (L) 39.0 - 52.0 %   MCV 100.0 78.0 - 100.0 fL   MCH 33.6 26.0 - 34.0 pg   MCHC 33.6 30.0 - 36.0 g/dL   RDW 15.4 11.5 - 15.5 %   Platelets 35 (L) 150 - 400 K/uL    Comment: CONSISTENT WITH PREVIOUS RESULT  Comprehensive metabolic panel     Status: Abnormal   Collection Time: 11/23/15  4:00 AM  Result Value Ref Range   Sodium 132 (L) 135 - 145 mmol/L   Potassium 4.4 3.5 - 5.1 mmol/L   Chloride 99 (L) 101 - 111 mmol/L   CO2 27 22 - 32 mmol/L   Glucose, Bld 110 (H) 65 - 99 mg/dL   BUN 23 (H) 6 - 20 mg/dL   Creatinine, Ser 1.86 (H) 0.61 - 1.24 mg/dL   Calcium 8.8 (L) 8.9 - 10.3 mg/dL   Total Protein 5.9 (L) 6.5 - 8.1 g/dL   Albumin 2.0 (L) 3.5 - 5.0 g/dL   AST 50 (H) 15 - 41 U/L   ALT 29 17 - 63 U/L   Alkaline Phosphatase 141 (H) 38 - 126 U/L   Total Bilirubin 3.2 (H) 0.3 - 1.2 mg/dL   GFR calc non Af Amer 41 (L) >60 mL/min   GFR calc Af Amer 47 (L) >60 mL/min    Comment: (NOTE) The eGFR has been calculated using the CKD EPI equation. This calculation has not been validated in all clinical situations. eGFR's persistently <60 mL/min signify possible Chronic Kidney Disease.    Anion gap 6 5 - 15  Ammonia     Status: Abnormal   Collection Time: 11/23/15  4:00 AM  Result Value Ref Range   Ammonia 66 (H) 9 - 35 umol/L     Lipid Panel     Component Value Date/Time   CHOL 179 09/07/2015 1000   TRIG 130.0 09/07/2015 1000   HDL 37.10* 09/07/2015 1000   CHOLHDL 5 09/07/2015 1000   VLDL 26.0 09/07/2015 1000   LDLCALC 116* 09/07/2015 1000   LDLDIRECT 115.5 11/16/2011 0923     Lab Results  Component Value Date   HGBA1C 5.5 10/17/2013     Lab Results  Component Value Date    MICROALBUR <0.7 08/27/2014   LDLCALC 116* 09/07/2015   CREATININE 1.86* 11/23/2015     50 y.o. male with a past medical history that includes hyperlipidemia, GERD, alcohol cirrhosis of the liver with ascites due to alcohol dependence in the process of preop testing for transplant list, chronic kidney disease stage III presents to the emergency department with chief complaint chest pain,left-sided chest pain that began tonight while working in his garden, an exertional activity. He also complains of an associated headache, nausea, clamminess, and aching left shoulder pain. He denies any known trigger of his left shoulder pain; he denies any recent trauma, injury, or heavy lifting . Patient seen by PCP on 5/22 for worsening leg swelling and weight gain. PCP increased diuretics. Patient is followed by [facility] ([name]/Dr Coralyn Pear) and is getting ready to be placed on the transplant list.. F/u appt with transplant clinic June 5th  Assessment and plan  #1. Atypical chest pain . Heart  score 5. History of chronic diastolic heart failure, hyperlipidemia, hypertension. Cardiac enzymes negative. EKG normal sinus rhythm. Chest x-ray without daily abnormalities mild vascular congestion. May be related to musculoskeletal strain after a day of gardening. Patient is scheduled to see cardiology in 2 days for stress echo in preparation for liver transplant. Telemetry uneventful Recent lipid panel showed LDL 116, triglycerides 130, total cholesterol 179 2-D echo to rule out wall motion abnormalities ordered but not completed, d-dimer significantly elevated but VQ scan and venous Doppler of the lower extremities was negative Not a candidate for aspirin given low platelets  #2. Acute renal failure superimposed on stage III chronic kidney disease. Creatinine on admission 2.2. Increase from 1.6 several days ago due to increase in diuretics. Chart review indicates Lasix increased due to gradual worsening of lower extremity edema.  Dose of Aldactone reduced    #3. Chronic diastolic heart failure. See #1. Echo done last year revealed an EF of 55%.  -Continue Lasix 80 mg a day, follow renal function, continue Aldactone at a lower dose -Patient has an appointment in 2 days for a stress echo to be done by cardiologist in [place]   #4. Hepatic Encephalopathy. 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 - rifaxamin    5. Alcohol cirrhosis of liver without ascites. Recent bilirubin 4.2. Platelets 42 which is close to his baseline. Other labs reflect liver disease trending up slightly. Chart review indicates most recent meld score 28. Currently in the middle of preliver transplant workup. Scheduled for MRI outpatient ,attempted but failed to complete this yesterday ,therefore need to schedule outpt MRI . Has appointment for stress echo in 2 days. -Continue rifaximin and lactulose MRI of the abdomen which the patient was scheduled to have in the outpatient setting as a part of his pretransplant workup. Attempted in patient but the patient was not able to complete secondary to claustrophobia   #6. Thrombocytopenia. Platelets are 35. Chart review indicates this is close to his baseline. no Signs symptoms of active bleeding. INR 1.9 -Monitor  7. Hypertension. Control in the emergency department. Tends to be somewhat on the soft side. Home medications include Lasix. Spironolactone. - continue home meds      Discharge Exam:    Blood pressure 108/56, pulse 82, temperature 98.1 F (36.7 C), temperature source Oral, resp. rate 21, height 5' 7" (1.702 m), weight 118.842 kg (262 lb), SpO2 98 %.  Eyes: Conjunctivae and EOM are normal.  Neck: Neck supple. JVD present. No tracheal deviation present.  Cardiovascular: Normal rate.  Murmur heard. RRR, with a systolic murmur. Jugular vein  is distended.  Pulmonary/Chest: Effort normal. No respiratory distress.  No appreciable chest wall tenderness.  Abdominal: Soft. There is no tenderness.  Abdomen is soft. No CVA tenderness to palpation.  Genitourinary:  No melena on rectal exam. No large hemorrhoids.  Musculoskeletal: Normal range of motion.  Neurological: He is alert and oriented to person, place, and time.  Skin: Skin is warm and dry.     Follow-up Information    Follow up with Ria Bush, MD. Schedule an appointment as soon as possible for a visit in 3 days.   Specialty:  Family Medicine   Why:  Hospital follow-up, also reschedule outpatient MRI, please complete your outpatient stress test   Contact information:   [place]  [phone]       Signed: Reyne Dumas 11/23/2015, 7:47 AM        Time spent >45 mins

## 2015-11-23 NOTE — Progress Notes (Signed)
Patient is discharge to home accompanied by patient's spouse and staff volunteer via wheelchair. Prescriptions and discharge instructions given . Patient verbalizes understanding. All personal belongings given. Telemetry box and IV removed prior to discharge and site in good condition.

## 2015-11-24 ENCOUNTER — Other Ambulatory Visit: Payer: Self-pay | Admitting: Nurse Practitioner

## 2015-11-24 ENCOUNTER — Encounter: Payer: Self-pay | Admitting: Family Medicine

## 2015-11-24 DIAGNOSIS — K7469 Other cirrhosis of liver: Secondary | ICD-10-CM

## 2015-11-24 NOTE — Telephone Encounter (Addendum)
Rx written and in Kim's box along with latest sleep study.

## 2015-11-24 NOTE — Telephone Encounter (Signed)
Order and sleep study faxed to [facility].  

## 2015-11-25 ENCOUNTER — Encounter: Payer: Self-pay | Admitting: *Deleted

## 2015-11-25 ENCOUNTER — Telehealth: Payer: Self-pay | Admitting: *Deleted

## 2015-11-25 NOTE — Telephone Encounter (Signed)
Noted. Thanks.

## 2015-11-25 NOTE — Telephone Encounter (Signed)
Spoke with patient after hospital stay. He said he is feeling better than he was. He said the hospital thinks his CP was possibly caused by the spironolactone. He has had quite a few appts scheduled with the liver center in [place] and here since discharge. They are saying he may be put on transplant list next week. He has appt scheduled at the end of June and will come in sooner if necessary.

## 2015-11-26 ENCOUNTER — Ambulatory Visit
Admission: RE | Admit: 2015-11-26 | Discharge: 2015-11-26 | Disposition: A | Discharge status: Home / Self Care | Payer: BLUE CROSS/BLUE SHIELD | Source: Ambulatory Visit | Attending: Nurse Practitioner | Admitting: Nurse Practitioner

## 2015-11-26 DIAGNOSIS — K7469 Other cirrhosis of liver: Secondary | ICD-10-CM

## 2015-11-26 MED ORDER — IOPAMIDOL (ISOVUE-300) INJECTION 61%
80.0000 mL | Freq: Once | INTRAVENOUS | Status: AC | PRN
  Administered 2015-11-26: 80 mL via INTRAVENOUS

## 2015-11-26 MED ORDER — IOHEXOL 300 MG/ML  SOLN
30.0000 mL | Freq: Once | INTRAMUSCULAR | Status: AC | PRN
  Administered 2015-11-26: 30 mL via ORAL

## 2015-11-30 ENCOUNTER — Ambulatory Visit: Payer: BLUE CROSS/BLUE SHIELD | Admitting: Pulmonary Disease

## 2015-12-04 ENCOUNTER — Encounter: Payer: Self-pay | Admitting: Family Medicine

## 2015-12-04 ENCOUNTER — Ambulatory Visit (INDEPENDENT_AMBULATORY_CARE_PROVIDER_SITE_OTHER): Payer: BLUE CROSS/BLUE SHIELD | Admitting: *Deleted

## 2015-12-04 DIAGNOSIS — Z23 Encounter for immunization: Secondary | ICD-10-CM

## 2015-12-04 DIAGNOSIS — K766 Portal hypertension: Secondary | ICD-10-CM

## 2015-12-04 DIAGNOSIS — K3189 Other diseases of stomach and duodenum: Secondary | ICD-10-CM | POA: Insufficient documentation

## 2015-12-16 ENCOUNTER — Other Ambulatory Visit: Payer: Self-pay | Admitting: Family Medicine

## 2015-12-16 DIAGNOSIS — K7030 Alcoholic cirrhosis of liver without ascites: Secondary | ICD-10-CM

## 2015-12-17 ENCOUNTER — Telehealth: Payer: Self-pay | Admitting: *Deleted

## 2015-12-17 ENCOUNTER — Other Ambulatory Visit (INDEPENDENT_AMBULATORY_CARE_PROVIDER_SITE_OTHER): Payer: BLUE CROSS/BLUE SHIELD

## 2015-12-17 DIAGNOSIS — K7030 Alcoholic cirrhosis of liver without ascites: Secondary | ICD-10-CM

## 2015-12-17 LAB — COMPREHENSIVE METABOLIC PANEL
ALK PHOS: 142 U/L — AB (ref 39–117)
ALT: 24 U/L (ref 0–53)
AST: 40 U/L — AB (ref 0–37)
Albumin: 2.3 g/dL — ABNORMAL LOW (ref 3.5–5.2)
BUN: 17 mg/dL (ref 6–23)
CO2: 28 mEq/L (ref 19–32)
Calcium: 9.1 mg/dL (ref 8.4–10.5)
Chloride: 105 mEq/L (ref 96–112)
Creatinine, Ser: 1.71 mg/dL — ABNORMAL HIGH (ref 0.40–1.50)
GFR: 45.4 mL/min — AB (ref >60.00)
GLUCOSE: 102 mg/dL — AB (ref 70–99)
POTASSIUM: 4.6 meq/L (ref 3.5–5.1)
SODIUM: 135 meq/L (ref 135–145)
TOTAL PROTEIN: 6.3 g/dL (ref 6.0–8.3)
Total Bilirubin: 2.8 mg/dL — ABNORMAL HIGH (ref 0.2–1.2)

## 2015-12-17 LAB — CBC WITH DIFFERENTIAL/PLATELET
Basophils Absolute: 0 10*3/uL (ref 0.0–0.1)
Basophils Relative: 1.1 % (ref 0.0–3.0)
EOS PCT: 3 % (ref 0.0–5.0)
Eosinophils Absolute: 0.1 10*3/uL (ref 0.0–0.7)
LYMPHS ABS: 0.9 10*3/uL (ref 0.7–4.0)
Lymphocytes Relative: 22.1 % (ref 12.0–46.0)
MCHC: 34.2 g/dL (ref 30.0–36.0)
MCV: 100.8 fl — AB (ref 78.0–100.0)
MONOS PCT: 8.9 % (ref 3.0–12.0)
Monocytes Absolute: 0.3 10*3/uL (ref 0.1–1.0)
NEUTROS PCT: 64.9 % (ref 43.0–77.0)
Neutro Abs: 2.5 10*3/uL (ref 1.4–7.7)
Platelets: 50 10*3/uL — ABNORMAL LOW (ref 150.0–400.0)
RDW: 15.3 % (ref 11.5–15.5)
WBC: 3.9 10*3/uL — ABNORMAL LOW (ref 4.0–10.5)

## 2015-12-17 LAB — PROTIME-INR
INR: 1.7 ratio — ABNORMAL HIGH (ref 0.8–1.0)
Prothrombin Time: 18.1 s — ABNORMAL HIGH (ref 9.6–13.1)

## 2015-12-17 NOTE — Telephone Encounter (Signed)
yes

## 2015-12-17 NOTE — Telephone Encounter (Signed)
Received call from [facility] lab with critical labs. Platelet Ct 50, HgB 8.2, Hct 24.1, RBC 2.39. These labs are consistent with results of labs over the past few months. Results given to Dr. Silvio Pate.

## 2015-12-17 NOTE — Telephone Encounter (Signed)
Results discussed with Dr. Silvio Pate, since they are consistent with previous I was advised ok to route to Dr. Darnell Level.

## 2015-12-21 ENCOUNTER — Encounter: Payer: Self-pay | Admitting: Family Medicine

## 2015-12-21 DIAGNOSIS — I5032 Chronic diastolic (congestive) heart failure: Secondary | ICD-10-CM

## 2015-12-22 ENCOUNTER — Ambulatory Visit: Payer: BLUE CROSS/BLUE SHIELD | Admitting: Family Medicine

## 2015-12-26 HISTORY — PX: LIVER TRANSPLANTATION: SUR1389

## 2015-12-28 ENCOUNTER — Encounter: Payer: Self-pay | Admitting: Family Medicine

## 2015-12-28 ENCOUNTER — Ambulatory Visit (INDEPENDENT_AMBULATORY_CARE_PROVIDER_SITE_OTHER): Payer: BLUE CROSS/BLUE SHIELD | Admitting: Family Medicine

## 2015-12-28 VITALS — BP 138/62 | HR 88 | Temp 98.5°F | Wt 282.5 lb

## 2015-12-28 DIAGNOSIS — Z6841 Body Mass Index (BMI) 40.0 and over, adult: Secondary | ICD-10-CM

## 2015-12-28 DIAGNOSIS — N183 Chronic kidney disease, stage 3 unspecified: Secondary | ICD-10-CM

## 2015-12-28 DIAGNOSIS — K7030 Alcoholic cirrhosis of liver without ascites: Secondary | ICD-10-CM | POA: Diagnosis not present

## 2015-12-28 DIAGNOSIS — D61818 Other pancytopenia: Secondary | ICD-10-CM

## 2015-12-28 DIAGNOSIS — I5030 Unspecified diastolic (congestive) heart failure: Secondary | ICD-10-CM | POA: Diagnosis not present

## 2015-12-28 DIAGNOSIS — R600 Localized edema: Secondary | ICD-10-CM

## 2015-12-28 MED ORDER — METOLAZONE 2.5 MG PO TABS: 2.5000 mg | ORAL_TABLET | ORAL | Status: DC

## 2015-12-28 NOTE — Patient Instructions (Signed)
Decrease lasix back to 80mg  once daily. Add on zaroxolyn one pill today and update me with effect. If improved urinary output, may continue Monday Wednesday Friday as needed.

## 2015-12-28 NOTE — Assessment & Plan Note (Signed)
Anticipate mild exacerbation of HFpEF - treat with continued lasix 80mg , spironolactone 25mg , add zaroxolyn 2.5mg  MWF PRN. Update with effect later this week. If not effective, consider change from lasix to torsemide. Pt/wife agree with plan. I have requested they send me today's labs from [place] (standing orders from liver transplant clinic) Encouraged he avoid outdoor heat.

## 2015-12-28 NOTE — Progress Notes (Signed)
Pre visit review using our clinic review tool, if applicable. No additional management support is needed unless otherwise documented below in the visit note. 

## 2015-12-28 NOTE — Progress Notes (Signed)
BP 138/62 mmHg  Pulse 88  Temp(Src) 98.5 F (36.9 C) (Oral)  Wt 282 lb 8 oz (128.141 kg)  SpO2 98%   CC: f/u visit  Subjective:    Patient ID: Colton Porter, male    DOB: [birth year], 50 y.o.   MRN: [id number]  HPI: Colton Porter is a 50 y.o. male presenting on 12/28/2015 for Follow-up and Fluid retention   Known alcoholic cirrhosis now on transplant list. MELD score ranging from 20s-34, latest 22. Also with known CKD, HFpEF (latest echo 11/2014).  Not feeling well over the last week. Retaining fluid, dry cough with wheezing present, dyspnea, orthopnea. Bilateral calf pain with cramping, no burning or tingling but they feel wet. No fevers, headaches, palpitations.   Currently taking 80mg  twice daily (doulbed up over last few days) as well as spironolactone 25mg  once daily.   Relevant past medical, surgical, family and social history reviewed and updated as indicated. Interim medical history since our last visit reviewed. Allergies and medications reviewed and updated. Current Outpatient Prescriptions on File Prior to Visit  Medication Sig  . acetaminophen (TYLENOL) 500 MG tablet Take 500 mg by mouth every 6 (six) hours as needed for mild pain.  . furosemide (LASIX) 80 MG tablet Take 1 tablet (80 mg total) by mouth daily.  Marland Kitchen lactulose (CHRONULAC) 10 GM/15ML solution Take 30 mLs (20 g total) by mouth 3 (three) times daily.  Marland Kitchen loratadine (CLARITIN) 10 MG tablet Take 10 mg by mouth daily. Reported on 11/16/2015  . magnesium oxide (MAG-OX) 400 (241.3 Mg) MG tablet Take 1 tablet (400 mg total) by mouth 2 (two) times daily.  . Multiple Vitamin (MULTIVITAMIN WITH MINERALS) TABS tablet Take 2 tablets by mouth every evening.  Marland Kitchen omeprazole (PRILOSEC) 40 MG capsule Take 1 capsule (40 mg total) by mouth daily.  . phytonadione (MEPHYTON) 5 MG tablet Take 2 tablets (10 mg total) by mouth daily.  . promethazine (PHENERGAN) 25 MG tablet Take 0.5-1 tablets (12.5-25 mg total) by mouth 2 (two) times  daily as needed for nausea or vomiting.  . rifaximin (XIFAXAN) 550 MG TABS tablet Take 550 mg by mouth 2 (two) times daily.  . rizatriptan (MAXALT) 5 MG tablet Take 5 mg by mouth as needed for migraine (may repeat in 2 hours if needed for migraine relief). May repeat in 2 hours if needed  . spironolactone (ALDACTONE) 25 MG tablet Take 1 tablet (25 mg total) by mouth daily.  . traMADol (ULTRAM) 50 MG tablet Take 1 tablet (50 mg total) by mouth every 6 (six) hours as needed for moderate pain.  . traZODone (DESYREL) 50 MG tablet Take 25-50 mg by mouth at bedtime as needed for sleep.   No current facility-administered medications on file prior to visit.    Review of Systems Per HPI unless specifically indicated in ROS section     Objective:    BP 138/62 mmHg  Pulse 88  Temp(Src) 98.5 F (36.9 C) (Oral)  Wt 282 lb 8 oz (128.141 kg)  SpO2 98%  Wt Readings from Last 3 Encounters:  12/28/15 282 lb 8 oz (128.141 kg)  11/23/15 262 lb (118.842 kg)  11/16/15 284 lb 4 oz (128.935 kg)    Physical Exam  Constitutional: He appears well-developed and well-nourished. No distress.  HENT:  Mouth/Throat: Oropharynx is clear and moist. No oropharyngeal exudate.  Eyes: EOM are normal. Pupils are equal, round, and reactive to light.  Neck: Normal range of motion. Neck supple.  Cardiovascular: Normal  rate, regular rhythm and intact distal pulses.   Murmur (2/6 SEM) heard. Pulmonary/Chest: Effort normal and breath sounds normal. No respiratory distress. He has no wheezes. He has no rales.  No significant wheezing or crackles appreciated  Abdominal: Soft. Bowel sounds are normal. He exhibits distension and fluid wave. He exhibits no mass. There is no tenderness. There is no rebound and no guarding.  Musculoskeletal: He exhibits edema (1+ pitting with nonpitting edema).  Skin: Skin is warm and dry.  Jaundiced without singificant icterus  Psychiatric: He has a normal mood and affect.  Nursing note and  vitals reviewed.  Results for orders placed or performed in visit on 12/17/15  Comprehensive metabolic panel  Result Value Ref Range   Sodium 135 135 - 145 mEq/L   Potassium 4.6 3.5 - 5.1 mEq/L   Chloride 105 96 - 112 mEq/L   CO2 28 19 - 32 mEq/L   Glucose, Bld 102 (H) 70 - 99 mg/dL   BUN 17 6 - 23 mg/dL   Creatinine, Ser 1.71 (H) 0.40 - 1.50 mg/dL   Total Bilirubin 2.8 (H) 0.2 - 1.2 mg/dL   Alkaline Phosphatase 142 (H) 39 - 117 U/L   AST 40 (H) 0 - 37 U/L   ALT 24 0 - 53 U/L   Total Protein 6.3 6.0 - 8.3 g/dL   Albumin 2.3 (L) 3.5 - 5.2 g/dL   Calcium 9.1 8.4 - 10.5 mg/dL   GFR 45.40 (L) >60.00 mL/min  CBC with Differential/Platelet  Result Value Ref Range   WBC 3.9 (L) 4.0 - 10.5 K/uL   RBC 2.39 Repeated and verified X2. (L) 4.22 - 5.81 Mil/uL   Hemoglobin 8.2 Repeated and verified X2. (L) 13.0 - 17.0 g/dL   HCT 24.1 Repeated and verified X2. (L) 39.0 - 52.0 %   MCV 100.8 (H) 78.0 - 100.0 fl   MCHC 34.2 30.0 - 36.0 g/dL   RDW 15.3 11.5 - 15.5 %   Platelets 50.0 Repeated and verified X2. (L) 150.0 - 400.0 K/uL   Neutrophils Relative % 64.9 43.0 - 77.0 %   Lymphocytes Relative 22.1 12.0 - 46.0 %   Monocytes Relative 8.9 3.0 - 12.0 %   Eosinophils Relative 3.0 0.0 - 5.0 %   Basophils Relative 1.1 0.0 - 3.0 %   Neutro Abs 2.5 1.4 - 7.7 K/uL   Lymphs Abs 0.9 0.7 - 4.0 K/uL   Monocytes Absolute 0.3 0.1 - 1.0 K/uL   Eosinophils Absolute 0.1 0.0 - 0.7 K/uL   Basophils Absolute 0.0 0.0 - 0.1 K/uL  Protime-INR  Result Value Ref Range   INR 1.7 (H) 0.8 - 1.0 ratio   Prothrombin Time 18.1 (H) 9.6 - 13.1 sec      Assessment & Plan:   Problem List Items Addressed This Visit    Pedal edema   Morbid obesity with BMI of 40.0-44.9, adult ([place])   (HFpEF) heart failure with preserved ejection fraction (HCC) - Primary    Anticipate mild exacerbation of HFpEF - treat with continued lasix 80mg , spironolactone 25mg , add zaroxolyn 2.5mg  MWF PRN. Update with effect later this week. If not  effective, consider change from lasix to torsemide. Pt/wife agree with plan. I have requested they send me today's labs from [place] (standing orders from liver transplant clinic) Encouraged he avoid outdoor heat.       Relevant Medications   metolazone (ZAROXOLYN) 2.5 MG tablet   Alcoholic cirrhosis of liver without ascites (HCC)   Pancytopenia, acquired ([place])  CKD (chronic kidney disease) stage 3, GFR 30-59 ml/min       Follow up plan: Return in about 4 weeks (around 01/25/2016) for follow up visit.  Ria Bush, MD

## 2015-12-30 ENCOUNTER — Other Ambulatory Visit: Payer: Self-pay | Admitting: Family Medicine

## 2015-12-30 DIAGNOSIS — I5032 Chronic diastolic (congestive) heart failure: Secondary | ICD-10-CM

## 2015-12-30 NOTE — Telephone Encounter (Signed)
Please see Mychart message.

## 2016-01-01 ENCOUNTER — Telehealth: Payer: Self-pay | Admitting: *Deleted

## 2016-01-01 NOTE — Telephone Encounter (Signed)
Colton Porter, patient's sister came in and wanted to let you know that patient is in-patient at [facility]. He is scheduled for a paracentesis later today and is on IV lasix. He has had 2 transfusions due to Hgb dropping. His MELD score is now a 31. They had a liver match for him, but once it was harvested, it was not a good liver. They have moved him up on the transplant list. She just wanted to make you aware.

## 2016-01-04 ENCOUNTER — Telehealth: Payer: Self-pay | Admitting: Family Medicine

## 2016-01-04 NOTE — Telephone Encounter (Signed)
Pt sister called in to let us know that pt has a new liver.  Pt is doing well.  No cb needed

## 2016-01-04 NOTE — Telephone Encounter (Signed)
Great to hear!

## 2016-01-06 ENCOUNTER — Telehealth: Payer: Self-pay | Admitting: Gastroenterology

## 2016-01-07 NOTE — Telephone Encounter (Signed)
Pt has had a liver transplant and wife states that he had the annual injection.  They will get a copy and forward to our office for review

## 2016-01-28 ENCOUNTER — Ambulatory Visit: Payer: BLUE CROSS/BLUE SHIELD | Admitting: Family Medicine

## 2016-02-11 ENCOUNTER — Encounter: Payer: Self-pay | Admitting: Family Medicine

## 2016-03-15 ENCOUNTER — Encounter: Payer: Self-pay | Admitting: Family Medicine

## 2016-03-15 ENCOUNTER — Ambulatory Visit (INDEPENDENT_AMBULATORY_CARE_PROVIDER_SITE_OTHER): Payer: BLUE CROSS/BLUE SHIELD | Admitting: Family Medicine

## 2016-03-15 VITALS — BP 140/90 | HR 95 | Temp 98.5°F | Wt 258.5 lb

## 2016-03-15 DIAGNOSIS — N183 Chronic kidney disease, stage 3 unspecified: Secondary | ICD-10-CM

## 2016-03-15 DIAGNOSIS — Z6841 Body Mass Index (BMI) 40.0 and over, adult: Secondary | ICD-10-CM

## 2016-03-15 DIAGNOSIS — R739 Hyperglycemia, unspecified: Secondary | ICD-10-CM

## 2016-03-15 DIAGNOSIS — R339 Retention of urine, unspecified: Secondary | ICD-10-CM

## 2016-03-15 DIAGNOSIS — K7030 Alcoholic cirrhosis of liver without ascites: Secondary | ICD-10-CM

## 2016-03-15 DIAGNOSIS — Z944 Liver transplant status: Secondary | ICD-10-CM

## 2016-03-15 NOTE — Assessment & Plan Note (Signed)
Latest Cr 1.58. Was recommended renal referral after liver transplant given persistently elevated kidney function. Will place referral. Holding diuretics for now per liver transplant clinic.

## 2016-03-15 NOTE — Assessment & Plan Note (Addendum)
Followed closely by [facility] liver transplant team. On prograf, prednisone, and mycophenolate. Also in bactrim for PCP ppx and valganciclovir for EBV ppx. Appreciate their care.

## 2016-03-15 NOTE — Patient Instructions (Addendum)
We will refer you to nephrology in [place].  You are doing well today Return in 2 months with labs through [facility] prior.

## 2016-03-15 NOTE — Assessment & Plan Note (Signed)
Completed liver transplant 12/2015.

## 2016-03-15 NOTE — Assessment & Plan Note (Signed)
Discussed regular exercise routine to maintain weight control.

## 2016-03-15 NOTE — Addendum Note (Signed)
Addended by: Ria Bush on: 03/15/2016 09:52 AM   Modules accepted: Orders

## 2016-03-15 NOTE — Assessment & Plan Note (Signed)
Check A1c prior to next visit. He is not checking sugars at home.

## 2016-03-15 NOTE — Progress Notes (Signed)
BP 140/90   Pulse 95   Temp 98.5 F (36.9 C) (Oral)   Wt 258 lb 8 oz (117.3 kg)   BMI 40.49 kg/m    CC: f/u liver transplant Subjective:    Patient ID: Colton Porter, male    DOB: 1966/05/24, 50 y.o.   MRN: [id number]  HPI: Colton Porter is a 50 y.o. male presenting on 03/15/2016 for Follow-up   S/p liver transplantation 12/2015 (Drs Levi/Zamor at [facility]). Notes reviewed. Post op course complicated by AKI and volume overload. Latest Cr 1.6. For this reason, diuretics were held. Currently on prograf 3mg  bid, myfortic 720 bid, and prednisone 10mg  daily. Takes bactrim DS MWF preventatively. Labs weekly at [facility].  States he's been asked to see ?nephrologist/urologist.  Trying to walk 1/2 mi daily.   Intermittent pedal edema but overall improved. Minimal dyspnea.  Records they brought were reviewed.   Relevant past medical, surgical, family and social history reviewed and updated as indicated. Interim medical history since our last visit reviewed. Allergies and medications reviewed and updated. Current Outpatient Prescriptions on File Prior to Visit  Medication Sig  . magnesium oxide (MAG-OX) 400 (241.3 Mg) MG tablet Take 1 tablet (400 mg total) by mouth 2 (two) times daily.  Marland Kitchen omeprazole (PRILOSEC) 40 MG capsule Take 1 capsule (40 mg total) by mouth daily. (Patient taking differently: Take 40 mg by mouth 2 (two) times daily. )  . acetaminophen (TYLENOL) 500 MG tablet Take 500 mg by mouth every 6 (six) hours as needed for mild pain.  . Multiple Vitamin (MULTIVITAMIN WITH MINERALS) TABS tablet Take 2 tablets by mouth every evening.   No current facility-administered medications on file prior to visit.    Past Medical History:  Diagnosis Date  . Alcohol dependence ([place]) 07/11/2012  . Alcoholic cirrhosis of liver with ascites ([place]) 07/2014   s./p transplant 12/2015  . Allergy   . Anemia   . Cellulitis of left leg   . Chronic diastolic  heart failure ([place]) 11/01/2013  . CKD (chronic kidney disease) stage 3, GFR 30-59 ml/min 11/04/2015  . GERD (gastroesophageal reflux disease)   . Hyperlipidemia   . Hypertension   . Neuropathy ([place])   . OSA on CPAP 07/11/2012   HST 07/2013:  AHI 39/hr.    . Thrombocytopenia ([place]) 12/15/2011    Past Surgical History:  Procedure Laterality Date  . COLONOSCOPY  08/2013   hyperplastic polyps, hemorrhoids Ardis Hughs)  . 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 HAND SURGERY  1997  . LEFT AND RIGHT HEART CATHETERIZATION WITH CORONARY ANGIOGRAM N/A 06/10/2013   Procedure: LEFT AND RIGHT HEART CATHETERIZATION WITH CORONARY ANGIOGRAM;  Surgeon: Blane Ohara, MD;  Location: [facility] CATH LAB;  Service: Cardiovascular;  Laterality: N/A;  . LIVER TRANSPLANTATION  0000000   alcoholic cirrhosis (Levi/Zamor at [facility])    Review of Systems Per HPI unless specifically indicated in ROS section     Objective:    BP 140/90   Pulse 95   Temp 98.5 F (36.9 C) (Oral)   Wt 258 lb 8 oz (117.3 kg)   BMI 40.49 kg/m   Wt Readings from Last 3 Encounters:  03/15/16 258 lb 8 oz (117.3 kg)  12/28/15 282 lb 8 oz (128.1 kg)  11/23/15 262 lb (118.8 kg)    Physical Exam  Constitutional: He appears well-developed and well-nourished. No distress.  HENT:  Mouth/Throat: Oropharynx is clear and moist. No  oropharyngeal exudate.  Neck: Normal range of motion. Neck supple.  Cardiovascular: Normal rate, regular rhythm, normal heart sounds and intact distal pulses.   No murmur heard. Pulmonary/Chest: Effort normal and breath sounds normal. No respiratory distress. He has no wheezes. He has no rales.  Abdominal: Soft. Bowel sounds are normal. He exhibits no distension and no mass. There is no tenderness. There is no rebound and no guarding.  L shaped incision present on abdomen  Musculoskeletal: He exhibits no edema.  Lymphadenopathy:    He has no cervical adenopathy.  Skin: Skin is  warm and dry. No rash noted.  Psychiatric: He has a normal mood and affect.  Nursing note and vitals reviewed.  Results for orders placed or performed in visit on 12/17/15  Comprehensive metabolic panel  Result Value Ref Range   Sodium 135 135 - 145 mEq/L   Potassium 4.6 3.5 - 5.1 mEq/L   Chloride 105 96 - 112 mEq/L   CO2 28 19 - 32 mEq/L   Glucose, Bld 102 (H) 70 - 99 mg/dL   BUN 17 6 - 23 mg/dL   Creatinine, Ser 1.71 (H) 0.40 - 1.50 mg/dL   Total Bilirubin 2.8 (H) 0.2 - 1.2 mg/dL   Alkaline Phosphatase 142 (H) 39 - 117 U/L   AST 40 (H) 0 - 37 U/L   ALT 24 0 - 53 U/L   Total Protein 6.3 6.0 - 8.3 g/dL   Albumin 2.3 (L) 3.5 - 5.2 g/dL   Calcium 9.1 8.4 - 10.5 mg/dL   GFR 45.40 (L) >60.00 mL/min  CBC with Differential/Platelet  Result Value Ref Range   WBC 3.9 (L) 4.0 - 10.5 K/uL   RBC 2.39 Repeated and verified X2. (L) 4.22 - 5.81 Mil/uL   Hemoglobin 8.2 Repeated and verified X2. (L) 13.0 - 17.0 g/dL   HCT 24.1 Repeated and verified X2. (L) 39.0 - 52.0 %   MCV 100.8 (H) 78.0 - 100.0 fl   MCHC 34.2 30.0 - 36.0 g/dL   RDW 15.3 11.5 - 15.5 %   Platelets 50.0 Repeated and verified X2. (L) 150.0 - 400.0 K/uL   Neutrophils Relative % 64.9 43.0 - 77.0 %   Lymphocytes Relative 22.1 12.0 - 46.0 %   Monocytes Relative 8.9 3.0 - 12.0 %   Eosinophils Relative 3.0 0.0 - 5.0 %   Basophils Relative 1.1 0.0 - 3.0 %   Neutro Abs 2.5 1.4 - 7.7 K/uL   Lymphs Abs 0.9 0.7 - 4.0 K/uL   Monocytes Absolute 0.3 0.1 - 1.0 K/uL   Eosinophils Absolute 0.1 0.0 - 0.7 K/uL   Basophils Absolute 0.0 0.0 - 0.1 K/uL  Protime-INR  Result Value Ref Range   INR 1.7 (H) 0.8 - 1.0 ratio   Prothrombin Time 18.1 (H) 9.6 - 13.1 sec      Assessment & Plan:   Problem List Items Addressed This Visit    Alcoholic cirrhosis of liver without ascites ([place])    Completed liver transplant 12/2015.       CKD (chronic kidney disease) stage 3, GFR 30-59 ml/min    Latest Cr 1.58. Was recommended renal referral after  liver transplant given persistently elevated kidney function. Will place referral. Holding diuretics for now per liver transplant clinic.       Hyperglycemia    Check A1c prior to next visit. He is not checking sugars at home.      Morbid obesity with BMI of 40.0-44.9, adult [facility])    Discussed  regular exercise routine to maintain weight control.      Status post liver transplant ([place]) - Primary    Followed closely by [facility] liver transplant team. On prograf, prednisone, and mycophenolate. Also in bactrim for PCP ppx and valganciclovir for EBV ppx. Appreciate their care.       Relevant Medications   tacrolimus (PROGRAF) 1 MG capsule   mycophenolate (MYFORTIC) 360 MG TBEC EC tablet   predniSONE (DELTASONE) 5 MG tablet    Other Visit Diagnoses   None.      Follow up plan: Return in about 2 months (around 05/15/2016) for follow up visit.  Ria Bush, MD

## 2016-03-16 NOTE — Telephone Encounter (Signed)
plz provide letter for pt's wife she was with him at appt yesterday. Thanks. Will cancel nephrology referral and place urology referral.

## 2016-03-21 ENCOUNTER — Encounter: Payer: Self-pay | Admitting: *Deleted

## 2016-03-22 ENCOUNTER — Encounter: Payer: Self-pay | Admitting: Family Medicine

## 2016-03-22 NOTE — Telephone Encounter (Signed)
See Mychart message. They want to proceed with uro referral.

## 2016-03-22 NOTE — Telephone Encounter (Signed)
Please see Mychart message.

## 2016-03-28 LAB — BASIC METABOLIC PANEL
BUN: 23 mg/dL — AB (ref 4–21)
Creatinine: 1.6 mg/dL — AB (ref <=1.3)
GLUCOSE: 95 mg/dL
Potassium: 4.8 mmol/L (ref 3.4–5.3)
SODIUM: 138 mmol/L (ref 137–147)

## 2016-03-28 LAB — CBC AND DIFFERENTIAL
Hemoglobin: 10.6 g/dL — AB (ref 13.5–17.5)
PLATELETS: 136 10*3/uL — AB (ref 150–399)
WBC: 4.7 10*3/mL

## 2016-03-28 LAB — HEPATIC FUNCTION PANEL
ALK PHOS: 59 U/L (ref 25–125)
ALT: 12 U/L (ref 10–40)
AST: 13 U/L — AB (ref 14–40)
Bilirubin, Total: 0.6 mg/dL

## 2016-03-30 ENCOUNTER — Encounter: Payer: Self-pay | Admitting: Family Medicine

## 2016-04-28 ENCOUNTER — Encounter: Payer: Self-pay | Admitting: Family Medicine

## 2016-04-28 DIAGNOSIS — K7030 Alcoholic cirrhosis of liver without ascites: Secondary | ICD-10-CM

## 2016-04-28 DIAGNOSIS — Z944 Liver transplant status: Secondary | ICD-10-CM

## 2016-04-28 DIAGNOSIS — N183 Chronic kidney disease, stage 3 unspecified: Secondary | ICD-10-CM

## 2016-04-28 DIAGNOSIS — I5030 Unspecified diastolic (congestive) heart failure: Secondary | ICD-10-CM

## 2016-05-02 NOTE — Telephone Encounter (Signed)
Nephrology referral placed

## 2016-05-03 ENCOUNTER — Encounter: Payer: Self-pay | Admitting: Family Medicine

## 2016-05-03 ENCOUNTER — Telehealth: Payer: Self-pay | Admitting: Family Medicine

## 2016-05-03 NOTE — Telephone Encounter (Signed)
Noted  

## 2016-05-03 NOTE — Telephone Encounter (Signed)
[facility]  Patient Name: Colton Porter  DOB: 03/24/1966    Initial Comment Caller states c/o elevated blood pressure, 169/103. He is a liver transplant patient.    Nurse Assessment  Nurse: Wayne Sever, RN, Tillie Rung Date/Time (Eastern Time): 05/03/2016 9:48:44 AM  Confirm and document reason for call. If symptomatic, describe symptoms. You must click the next button to save text entered. ---Caller states his BP is elevated. He is a liver transplant patient. 169/106 was his reading this morning and he checked it again it was 165/103. Caller states he is feeling fine.  Has the patient traveled out of the country within the last 30 days? ---Not Applicable  Does the patient have any new or worsening symptoms? ---Yes  Will a triage be completed? ---Yes  Related visit to physician within the last 2 weeks? ---No  Does the PT have any chronic conditions? (i.e. diabetes, asthma, etc.) ---Yes  List chronic conditions. ---Liver Transplant, HTN  Is this a behavioral health or substance abuse call? ---No     Guidelines    Guideline Title Affirmed Question Affirmed Notes  High Blood Pressure BP ? 160/100    Final Disposition User   See PCP When Office is Open (within 3 days) Wayne Sever, RN, Tillie Rung    Comments  Scheduled with Alma Friendly at 1000am with Alma Friendly   Referrals  REFERRED TO PCP OFFICE   Disagree/Comply: Leta Baptist

## 2016-05-03 NOTE — Telephone Encounter (Signed)
Pt has appt with Allie Bossier NP on 05/04/16 at 10 AM.

## 2016-05-04 ENCOUNTER — Ambulatory Visit (INDEPENDENT_AMBULATORY_CARE_PROVIDER_SITE_OTHER): Payer: BLUE CROSS/BLUE SHIELD | Admitting: Primary Care

## 2016-05-04 ENCOUNTER — Encounter: Payer: Self-pay | Admitting: Primary Care

## 2016-05-04 VITALS — BP 162/96 | HR 88 | Temp 98.2°F | Wt 264.0 lb

## 2016-05-04 DIAGNOSIS — I10 Essential (primary) hypertension: Secondary | ICD-10-CM | POA: Diagnosis not present

## 2016-05-04 MED ORDER — LOSARTAN POTASSIUM 50 MG PO TABS: 50.0000 mg | ORAL_TABLET | Freq: Every day | ORAL | 0 refills | Status: DC

## 2016-05-04 NOTE — Progress Notes (Signed)
Pre visit review using our clinic review tool, if applicable. No additional management support is needed unless otherwise documented below in the visit note. 

## 2016-05-04 NOTE — Progress Notes (Signed)
Subjective:    Patient ID: Colton Porter, male    DOB: 19-Jul-1965, 50 y.o.   MRN: [id number]  HPI  Mr. Many is a 50 year old male with a history of hypertension, CKD stage III, and  liver transplant who presents today with a chief complaint of hypertension. He underwent liver transplant in July 2017 and was following with [facility] liver transplant team.  He is currently managed on Amlodipine 10 mg, doxazosin (BPH) 8 mg. Historically his blood pressure ranges between 120-130/60-70's prior to his transplant. His blood pressure has been above goal since his transplant in July 2017. He's been checking his BP at home with ranges of 140-160/90's. He does experience headaches and fatigue, and has gained some weight as he is not as active. He's also noticed shortness of breath with normal activities for the past 1 month. He denies cough, increased lower extremity edema.   Review of Systems  Eyes: Negative for visual disturbance.  Respiratory: Positive for shortness of breath. Negative for cough.   Cardiovascular: Negative for chest pain and leg swelling.  Gastrointestinal: Negative for abdominal pain.  Neurological: Negative for dizziness and weakness.       Past Medical History:  Diagnosis Date  . Alcohol dependence ([place]) 07/11/2012  . Alcoholic cirrhosis of liver with ascites ([place]) 07/2014   s./p transplant 12/2015  . Allergy   . Anemia   . Cellulitis of left leg   . Chronic diastolic heart failure ([place]) 11/01/2013  . CKD (chronic kidney disease) stage 3, GFR 30-59 ml/min 11/04/2015  . GERD (gastroesophageal reflux disease)   . Hyperlipidemia   . Hypertension   . Neuropathy ([place])   . OSA on CPAP 07/11/2012   HST 07/2013:  AHI 39/hr.    . Thrombocytopenia ([place]) 12/15/2011     Social History   Social History  . Marital status: Married    Spouse name: N/A  . Number of children: 2  . Years of education: N/A   Occupational History  . [employer]     no work lately   Social  History Main Topics  . Smoking status: Never Smoker  . Smokeless tobacco: Former Systems developer    Types: Chew  . Alcohol use No     Comment: 4-6 drinks daily - NO ETOH SINCE APRIL 2016  . Drug use:      Comment: remote use of marijuana in the past, has since quit  . Sexual activity: No   Other Topics Concern  . Not on file   Social History Narrative    Lives with wife   Occupation: full disability after cirrhosis dx, some working    Activity: some yardwork   Diet: good water, good fruit intake, lots of red meats    Past Surgical History:  Procedure Laterality Date  . COLONOSCOPY  08/2013   hyperplastic polyps, hemorrhoids Ardis Hughs)  . 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 HAND SURGERY  1997  . LEFT AND RIGHT HEART CATHETERIZATION WITH CORONARY ANGIOGRAM N/A 06/10/2013   Procedure: LEFT AND RIGHT HEART CATHETERIZATION WITH CORONARY ANGIOGRAM;  Surgeon: Blane Ohara, MD;  Location: [facility] CATH LAB;  Service: Cardiovascular;  Laterality: N/A;  . LIVER TRANSPLANTATION  0000000   alcoholic cirrhosis ([facility]i/Zamor at [facility])    Family History  Problem Relation Age of Onset  . Stroke Mother   . Hypertension Father   . Heart disease Father   . Lung cancer Maternal Aunt   .  Emphysema Mother   . Emphysema Father   . Heart attack Neg Hx   . Stroke Paternal Grandmother     Allergies  Allergen Reactions  . Nsaids Other (See Comments)    cirrhosis  . Tylenol [Acetaminophen] Other (See Comments)    cirrhosis  . Ambien [Zolpidem Tartrate] Other (See Comments)    Over-toxicity from liver failure  . Morphine And Related     Hallucinations.  . Hydrochlorothiazide W-Triamterene Other (See Comments)    REACTION: dizzy, nausea  . Lisinopril Other (See Comments)    REACTION: cough, decreased libido    Current Outpatient Prescriptions on File Prior to Visit  Medication Sig Dispense Refill  . acetaminophen (TYLENOL) 500 MG tablet Take 500 mg by  mouth every 6 (six) hours as needed for mild pain.    Marland Kitchen amLODipine (NORVASC) 10 MG tablet Take 10 mg by mouth daily.    Marland Kitchen aspirin 81 MG chewable tablet Chew 81 mg by mouth daily.    Marland Kitchen docusate sodium (COLACE) 100 MG capsule Take 100 mg by mouth daily as needed for mild constipation.    . magnesium oxide (MAG-OX) 400 (241.3 Mg) MG tablet Take 1 tablet (400 mg total) by mouth 2 (two) times daily.    . Melatonin 3 MG TABS Take 1 tablet by mouth at bedtime.    . Multiple Vitamin (MULTIVITAMIN WITH MINERALS) TABS tablet Take 2 tablets by mouth every evening.    . mycophenolate (MYFORTIC) 360 MG TBEC EC tablet Take 720 mg by mouth 2 (two) times daily.    Marland Kitchen omeprazole (PRILOSEC) 40 MG capsule Take 1 capsule (40 mg total) by mouth daily. (Patient taking differently: Take 40 mg by mouth 2 (two) times daily. ) 90 capsule 1  . predniSONE (DELTASONE) 5 MG tablet Take 10 mg by mouth daily with breakfast.    . sulfamethoxazole-trimethoprim (BACTRIM,SEPTRA) 400-80 MG tablet Take 1 tablet by mouth 3 (three) times a week. M,W,F    . tacrolimus (PROGRAF) 1 MG capsule Take 1 mg by mouth as directed. 3 capsules every AM and 2 capsules every PM    . tamsulosin (FLOMAX) 0.4 MG CAPS capsule Take 0.4 mg by mouth daily.    . valGANciclovir (VALCYTE) 450 MG tablet Take 450 mg by mouth daily.     No current facility-administered medications on file prior to visit.     BP (!) 162/96   Pulse 88   Temp 98.2 F (36.8 C) (Oral)   Wt 264 lb (119.7 kg)   SpO2 98%   BMI 41.35 kg/m    Objective:   Physical Exam  Constitutional: He appears well-nourished.  Cardiovascular: Normal rate and regular rhythm.   Mild lower extremity edema, non pitting.  Pulmonary/Chest: Effort normal and breath sounds normal.  Abdominal: Soft. Bowel sounds are normal. There is no tenderness.  Skin: Skin is warm and dry.          Assessment & Plan:

## 2016-05-04 NOTE — Assessment & Plan Note (Signed)
BP above goal over last 1-2 months. He will absolutely need treatment given history. He is at max dose of Amlodipine and doxazosin. Given CKD will initiate Losartan 50 mg for control of BP and renal protection. He is following with Urology now, will see nephrologist in December. He will continue to monitor his BP and we will call for readings in 1-2 weeks. BMP in 1 week.

## 2016-05-04 NOTE — Patient Instructions (Signed)
Your blood pressure is too high and needs to be treated.  Start Losartan 50 mg. Take 1 tablet by mouth once daily.   Schedule a lab only appointment next week for recheck of your kidney function and electrolytes.   Continue to monitor your blood pressure. We will call you for your readings in 1-2 weeks.  Work to reduce salty/fried/fatty foods in your diet. Take a look at the information below.  It was a pleasure meeting you!  DASH Eating Plan DASH stands for "Dietary Approaches to Stop Hypertension." The DASH eating plan is a healthy eating plan that has been shown to reduce high blood pressure (hypertension). Additional health benefits may include reducing the risk of type 2 diabetes mellitus, heart disease, and stroke. The DASH eating plan may also help with weight loss. WHAT DO I NEED TO KNOW ABOUT THE DASH EATING PLAN? For the DASH eating plan, you will follow these general guidelines:  Choose foods with a percent daily value for sodium of less than 5% (as listed on the food label).  Use salt-free seasonings or herbs instead of table salt or sea salt.  Check with your health care provider or pharmacist before using salt substitutes.  Eat lower-sodium products, often labeled as "lower sodium" or "no salt added."  Eat fresh foods.  Eat more vegetables, fruits, and low-fat dairy products.  Choose whole grains. Look for the word "whole" as the first word in the ingredient list.  Choose fish and skinless chicken or [place] more often than red meat. Limit fish, poultry, and meat to 6 oz (170 g) each day.  Limit sweets, desserts, sugars, and sugary drinks.  Choose heart-healthy fats.  Limit cheese to 1 oz (28 g) per day.  Eat more home-cooked food and less restaurant, buffet, and fast food.  Limit fried foods.  Cook foods using methods other than frying.  Limit canned vegetables. If you do use them, rinse them well to decrease the sodium.  When eating at a restaurant, ask  that your food be prepared with less salt, or no salt if possible. WHAT FOODS CAN I EAT? Seek help from a dietitian for individual calorie needs. Grains Whole grain or whole wheat bread. Brown rice. Whole grain or whole wheat pasta. Quinoa, bulgur, and whole grain cereals. Low-sodium cereals. Corn or whole wheat flour tortillas. Whole grain cornbread. Whole grain crackers. Low-sodium crackers. Vegetables Fresh or frozen vegetables (raw, steamed, roasted, or grilled). Low-sodium or reduced-sodium tomato and vegetable juices. Low-sodium or reduced-sodium tomato sauce and paste. Low-sodium or reduced-sodium canned vegetables.  Fruits All fresh, canned (in natural juice), or frozen fruits. Meat and Other Protein Products Ground beef (85% or leaner), grass-fed beef, or beef trimmed of fat. Skinless chicken or [place]. Ground chicken or [place]. Pork trimmed of fat. All fish and seafood. Eggs. Dried beans, peas, or lentils. Unsalted nuts and seeds. Unsalted canned beans. Dairy Low-fat dairy products, such as skim or 1% milk, 2% or reduced-fat cheeses, low-fat ricotta or cottage cheese, or plain low-fat yogurt. Low-sodium or reduced-sodium cheeses. Fats and Oils Tub margarines without trans fats. Light or reduced-fat mayonnaise and salad dressings (reduced sodium). Avocado. Safflower, olive, or canola oils. Natural peanut or almond butter. Other Unsalted popcorn and pretzels. The items listed above may not be a complete list of recommended foods or beverages. Contact your dietitian for more options. WHAT FOODS ARE NOT RECOMMENDED? Grains White bread. White pasta. White rice. Refined cornbread. Bagels and croissants. Crackers that contain trans fat. Vegetables Creamed  or fried vegetables. Vegetables in a cheese sauce. Regular canned vegetables. Regular canned tomato sauce and paste. Regular tomato and vegetable juices. Fruits Dried fruits. Canned fruit in light or heavy syrup. Fruit juice. Meat and  Other Protein Products Fatty cuts of meat. Ribs, chicken wings, bacon, sausage, bologna, salami, chitterlings, fatback, hot dogs, bratwurst, and packaged luncheon meats. Salted nuts and seeds. Canned beans with salt. Dairy Whole or 2% milk, cream, half-and-half, and cream cheese. Whole-fat or sweetened yogurt. Full-fat cheeses or blue cheese. Nondairy creamers and whipped toppings. Processed cheese, cheese spreads, or cheese curds. Condiments Onion and garlic salt, seasoned salt, table salt, and sea salt. Canned and packaged gravies. Worcestershire sauce. Tartar sauce. Barbecue sauce. Teriyaki sauce. Soy sauce, including reduced sodium. Steak sauce. Fish sauce. Oyster sauce. Cocktail sauce. Horseradish. Ketchup and mustard. Meat flavorings and tenderizers. Bouillon cubes. Hot sauce. Tabasco sauce. Marinades. Taco seasonings. Relishes. Fats and Oils Butter, stick margarine, lard, shortening, ghee, and bacon fat. Coconut, palm kernel, or palm oils. Regular salad dressings. Other Pickles and olives. Salted popcorn and pretzels. The items listed above may not be a complete list of foods and beverages to avoid. Contact your dietitian for more information. WHERE CAN I FIND MORE INFORMATION? National Heart, Lung, and Blood Institute: travelstabloid.com   This information is not intended to replace advice given to you by your health care provider. Make sure you discuss any questions you have with your health care provider.   Document Released: 06/02/2011 Document Revised: 07/04/2014 Document Reviewed: 04/17/2013 Elsevier Interactive Patient Education [employer].

## 2016-05-12 ENCOUNTER — Other Ambulatory Visit (INDEPENDENT_AMBULATORY_CARE_PROVIDER_SITE_OTHER): Payer: BLUE CROSS/BLUE SHIELD

## 2016-05-12 DIAGNOSIS — I10 Essential (primary) hypertension: Secondary | ICD-10-CM | POA: Diagnosis not present

## 2016-05-12 LAB — BASIC METABOLIC PANEL
BUN: 22 mg/dL (ref 6–23)
CALCIUM: 9.2 mg/dL (ref 8.4–10.5)
CO2: 25 mEq/L (ref 19–32)
Chloride: 105 mEq/L (ref 96–112)
Creatinine, Ser: 1.74 mg/dL — ABNORMAL HIGH (ref 0.40–1.50)
GFR: 44.43 mL/min — AB (ref >60.00)
GLUCOSE: 127 mg/dL — AB (ref 70–99)
Potassium: 5 mEq/L (ref 3.5–5.1)
Sodium: 137 mEq/L (ref 135–145)

## 2016-05-17 ENCOUNTER — Ambulatory Visit (INDEPENDENT_AMBULATORY_CARE_PROVIDER_SITE_OTHER): Payer: BLUE CROSS/BLUE SHIELD | Admitting: Family Medicine

## 2016-05-17 VITALS — BP 128/70 | HR 88 | Wt 277.0 lb

## 2016-05-17 DIAGNOSIS — Z944 Liver transplant status: Secondary | ICD-10-CM

## 2016-05-17 DIAGNOSIS — N183 Chronic kidney disease, stage 3 unspecified: Secondary | ICD-10-CM

## 2016-05-17 DIAGNOSIS — R4589 Other symptoms and signs involving emotional state: Secondary | ICD-10-CM

## 2016-05-17 DIAGNOSIS — R739 Hyperglycemia, unspecified: Secondary | ICD-10-CM | POA: Diagnosis not present

## 2016-05-17 DIAGNOSIS — G4733 Obstructive sleep apnea (adult) (pediatric): Secondary | ICD-10-CM

## 2016-05-17 DIAGNOSIS — Z9989 Dependence on other enabling machines and devices: Secondary | ICD-10-CM

## 2016-05-17 DIAGNOSIS — R5383 Other fatigue: Secondary | ICD-10-CM

## 2016-05-17 DIAGNOSIS — I10 Essential (primary) hypertension: Secondary | ICD-10-CM

## 2016-05-17 DIAGNOSIS — I5030 Unspecified diastolic (congestive) heart failure: Secondary | ICD-10-CM

## 2016-05-17 DIAGNOSIS — Z6841 Body Mass Index (BMI) 40.0 and over, adult: Secondary | ICD-10-CM

## 2016-05-17 DIAGNOSIS — F329 Major depressive disorder, single episode, unspecified: Secondary | ICD-10-CM | POA: Diagnosis not present

## 2016-05-17 LAB — CBC WITH DIFFERENTIAL/PLATELET
BASOS ABS: 0 10*3/uL (ref 0.0–0.1)
Basophils Relative: 0.7 % (ref 0.0–3.0)
EOS ABS: 0.1 10*3/uL (ref 0.0–0.7)
EOS PCT: 2 % (ref 0.0–5.0)
HCT: 29.7 % — ABNORMAL LOW (ref 39.0–52.0)
HEMOGLOBIN: 9.7 g/dL — AB (ref 13.0–17.0)
Lymphocytes Relative: 22.6 % (ref 12.0–46.0)
Lymphs Abs: 0.6 10*3/uL — ABNORMAL LOW (ref 0.7–4.0)
MCHC: 32.6 g/dL (ref 30.0–36.0)
MCV: 79.9 fl (ref 78.0–100.0)
MONO ABS: 0.2 10*3/uL (ref 0.1–1.0)
Monocytes Relative: 7.4 % (ref 3.0–12.0)
Neutro Abs: 1.9 10*3/uL (ref 1.4–7.7)
Neutrophils Relative %: 67.3 % (ref 43.0–77.0)
Platelets: 122 10*3/uL — ABNORMAL LOW (ref 150.0–400.0)
RBC: 3.71 Mil/uL — AB (ref 4.22–5.81)
RDW: 14.8 % (ref 11.5–15.5)
WBC: 2.8 10*3/uL — AB (ref 4.0–10.5)

## 2016-05-17 LAB — HEMOGLOBIN A1C: HEMOGLOBIN A1C: 6.1 % (ref 4.6–6.5)

## 2016-05-17 LAB — COMPREHENSIVE METABOLIC PANEL
ALBUMIN: 3.7 g/dL (ref 3.5–5.2)
ALK PHOS: 61 U/L (ref 39–117)
ALT: 11 U/L (ref 0–53)
AST: 9 U/L (ref 0–37)
BILIRUBIN TOTAL: 0.4 mg/dL (ref 0.2–1.2)
BUN: 22 mg/dL (ref 6–23)
CALCIUM: 8.9 mg/dL (ref 8.4–10.5)
CHLORIDE: 107 meq/L (ref 96–112)
CO2: 26 mEq/L (ref 19–32)
CREATININE: 1.54 mg/dL — AB (ref 0.40–1.50)
GFR: 51.14 mL/min — ABNORMAL LOW (ref >60.00)
Glucose, Bld: 121 mg/dL — ABNORMAL HIGH (ref 70–99)
Potassium: 4.8 mEq/L (ref 3.5–5.1)
Sodium: 139 mEq/L (ref 135–145)
Total Protein: 6.4 g/dL (ref 6.0–8.3)

## 2016-05-17 NOTE — Assessment & Plan Note (Signed)
Seems euvolemic today despite weight gain noted. Check EKG today.

## 2016-05-17 NOTE — Assessment & Plan Note (Addendum)
New - ?untreated OSA related vs seasonal affective disorder. Will give CPAP 1 wk and monitor for improvement, if no better, consider starting antidepressant. Pt agrees with plan.

## 2016-05-17 NOTE — Patient Instructions (Addendum)
Continue nightly CPAP. If no better on this, let me know to consider mood medicine.  Labs today.  EKG today Return to see cardiology for check up.

## 2016-05-17 NOTE — Assessment & Plan Note (Addendum)
Weight gain noted, possibly attributable to prednisone. Discussed healthy diet and lifestyle changes to affect sustainable weight loss.

## 2016-05-17 NOTE — Assessment & Plan Note (Signed)
Improved readings with increase in losartan to 100mg  daily (by nephrology). Update Cr today.

## 2016-05-17 NOTE — Assessment & Plan Note (Signed)
Update Cr with recent increased ARB dose. Has now established with nephrologist Dr Mills Koller

## 2016-05-17 NOTE — Assessment & Plan Note (Signed)
On prednisone. Check A1c.

## 2016-05-17 NOTE — Assessment & Plan Note (Signed)
Overall feeling better last few nights since CPAP started - encouraged compliance with this.

## 2016-05-17 NOTE — Progress Notes (Signed)
Pre visit review using our clinic review tool, if applicable. No additional management support is needed unless otherwise documented below in the visit note. 

## 2016-05-17 NOTE — Progress Notes (Addendum)
BP 128/70   Pulse 88   Wt 277 lb (125.6 kg)   SpO2 93%   BMI 43.38 kg/m    CC: f/u visit Subjective:    Patient ID: Colton Porter, male    DOB: [birth year], 50 y.o.   MRN: [id number]  HPI: Colton Porter is a 50 y.o. male presenting on 05/17/2016 for Follow-up   S/p liver transplant 12/2015 at [facility]. BP elevated recently - saw [name] earlier this month and losartan 50mg  daily was started. Renal function remained stable on recheck BMP 1 wk later. Losartan was increased to 100mg  daily by nephrologist (Dr Melida Gimenez). Released from transplant team in [place], continues seeing [name].   Saw urology and nephrology.    Feeling fatigued, depressed. Marked weight gain over last few months. CPAP over last 2 nights did help. Dyspnea improved with CPAP. Palpitations with exertion as well as chest tightness. No significant pedal edema. No coughing or wheezing. No fevers.   Relevant past medical, surgical, family and social history reviewed and updated as indicated. Interim medical history since our last visit reviewed. Allergies and medications reviewed and updated. Current Outpatient Prescriptions on File Prior to Visit  Medication Sig  . acetaminophen (TYLENOL) 500 MG tablet Take 500 mg by mouth every 6 (six) hours as needed for mild pain.  Marland Kitchen amLODipine (NORVASC) 10 MG tablet Take 10 mg by mouth daily.  Marland Kitchen aspirin 81 MG chewable tablet Chew 81 mg by mouth daily.  . cetirizine (ZYRTEC) 10 MG tablet Take 10 mg by mouth.  . docusate sodium (COLACE) 100 MG capsule Take 100 mg by mouth daily as needed for mild constipation.  Marland Kitchen doxazosin (CARDURA) 8 MG tablet   . magnesium oxide (MAG-OX) 400 (241.3 Mg) MG tablet Take 1 tablet (400 mg total) by mouth 2 (two) times daily.  . Melatonin 3 MG TABS Take 1 tablet by mouth at bedtime.  . Multiple Vitamin (MULTIVITAMIN WITH MINERALS) TABS tablet Take 2 tablets by mouth every evening.  . mycophenolate (MYFORTIC) 360 MG TBEC EC tablet  Take 720 mg by mouth 2 (two) times daily.  Marland Kitchen omeprazole (PRILOSEC) 40 MG capsule Take 1 capsule (40 mg total) by mouth daily. (Patient taking differently: Take 40 mg by mouth 2 (two) times daily. )  . predniSONE (DELTASONE) 5 MG tablet Take 10 mg by mouth daily with breakfast.  . sulfamethoxazole-trimethoprim (BACTRIM,SEPTRA) 400-80 MG tablet Take 1 tablet by mouth 3 (three) times a week. M,W,F  . tacrolimus (PROGRAF) 1 MG capsule Take 1 mg by mouth as directed. 3 capsules every AM and 2 capsules every PM  . tamsulosin (FLOMAX) 0.4 MG CAPS capsule Take 0.4 mg by mouth daily.  . valGANciclovir (VALCYTE) 450 MG tablet Take 450 mg by mouth daily.   No current facility-administered medications on file prior to visit.     Review of Systems Per HPI unless specifically indicated in ROS section     Objective:    BP 128/70   Pulse 88   Wt 277 lb (125.6 kg)   SpO2 93%   BMI 43.38 kg/m   Wt Readings from Last 3 Encounters:  05/17/16 277 lb (125.6 kg)  05/04/16 264 lb (119.7 kg)  03/15/16 258 lb 8 oz (117.3 kg)    Physical Exam  Constitutional: He appears well-developed and well-nourished. No distress.  HENT:  Mouth/Throat: Oropharynx is clear and moist. No oropharyngeal exudate.  Eyes: Conjunctivae are normal. Pupils are equal, round, and reactive to light.  Cardiovascular: Normal rate, normal heart sounds and intact distal pulses.   No murmur heard. Pulmonary/Chest: Effort normal and breath sounds normal. No respiratory distress. He has no wheezes. He has no rales.  Musculoskeletal: He exhibits no edema.  Skin: Skin is warm and dry. No rash noted.  Nursing note and vitals reviewed.  Results for orders placed or performed in visit on [id number]  Basic metabolic panel  Result Value Ref Range   Sodium 137 135 - 145 mEq/L   Potassium 5.0 3.5 - 5.1 mEq/L   Chloride 105 96 - 112 mEq/L   CO2 25 19 - 32 mEq/L   Glucose, Bld 127 (H) 70 - 99 mg/dL   BUN 22 6 - 23 mg/dL   Creatinine, Ser  1.74 (H) 0.40 - 1.50 mg/dL   Calcium 9.2 8.4 - 10.5 mg/dL   GFR 44.43 (L) >60.00 mL/min   Lab Results  Component Value Date   HGBA1C 5.5 10/17/2013    EKG - NSR rate 90s, normal axis, intervals, no acute ST/T changes, good R wave progression    Assessment & Plan:   Problem List Items Addressed This Visit    (HFpEF) heart failure with preserved ejection fraction ([place])    Seems euvolemic today despite weight gain noted. Check EKG today.       Relevant Medications   losartan (COZAAR) 100 MG tablet   Other Relevant Orders   EKG 12-Lead   CKD (chronic kidney disease) stage 3, GFR 30-59 ml/min    Update Cr with recent increased ARB dose. Has now established with nephrologist Dr Mills Koller      Relevant Orders   Comprehensive metabolic panel   CBC with Differential/Platelet   Depressed mood - Primary    New - ?untreated OSA related vs seasonal affective disorder. Will give CPAP 1 wk and monitor for improvement, if no better, consider starting antidepressant. Pt agrees with plan.      Essential hypertension    Improved readings with increase in losartan to 100mg  daily (by nephrology). Update Cr today.      Relevant Medications   losartan (COZAAR) 100 MG tablet   Hyperglycemia    On prednisone. Check A1c.      Relevant Orders   Hemoglobin A1c   Morbid obesity with BMI of 40.0-44.9, adult (HCC)    Weight gain noted, possibly attributable to prednisone. Discussed healthy diet and lifestyle changes to affect sustainable weight loss.      OSA on CPAP    Overall feeling better last few nights since CPAP started - encouraged compliance with this.      Other fatigue   Status post liver transplant ([place])       Follow up plan: Return in about 4 months (around 09/14/2016) for follow up visit.  Ria Bush, MD

## 2016-05-21 ENCOUNTER — Other Ambulatory Visit: Payer: Self-pay | Admitting: Family Medicine

## 2016-05-21 DIAGNOSIS — D649 Anemia, unspecified: Secondary | ICD-10-CM

## 2016-05-31 ENCOUNTER — Encounter: Payer: Self-pay | Admitting: Family Medicine

## 2016-05-31 NOTE — Telephone Encounter (Signed)
Per patient's wife, Thornton Papas said he didn't need to be seen by her earlier, but rather seen by cards. He has appt scheduled there for next week.

## 2016-05-31 NOTE — Telephone Encounter (Signed)
plz have pt schedule expedited f/u with [name] Drazek.

## 2016-05-31 NOTE — Telephone Encounter (Addendum)
plz fax mychart message and latest labs attn [name] as fyi and to ensure we're all on same page.  Will defer f/u timing to liver clinic.

## 2016-06-01 ENCOUNTER — Other Ambulatory Visit (INDEPENDENT_AMBULATORY_CARE_PROVIDER_SITE_OTHER): Payer: BLUE CROSS/BLUE SHIELD

## 2016-06-01 DIAGNOSIS — D649 Anemia, unspecified: Secondary | ICD-10-CM | POA: Diagnosis not present

## 2016-06-01 LAB — FECAL OCCULT BLOOD, IMMUNOCHEMICAL: Fecal Occult Bld: POSITIVE — AB

## 2016-06-01 NOTE — Telephone Encounter (Signed)
Results and mychart message faxed to [facility].

## 2016-06-02 ENCOUNTER — Other Ambulatory Visit: Payer: Self-pay | Admitting: Family Medicine

## 2016-06-02 ENCOUNTER — Encounter: Payer: Self-pay | Admitting: *Deleted

## 2016-06-02 ENCOUNTER — Telehealth: Payer: Self-pay | Admitting: *Deleted

## 2016-06-02 DIAGNOSIS — R195 Other fecal abnormalities: Secondary | ICD-10-CM

## 2016-06-02 NOTE — Telephone Encounter (Signed)
Just FYI- I spoke with Colton Porter about his labs that I faxed over. She says that his anemia is probably due to his immunosuppression. She also said that nephrology had no concerns currently. They are currently working on a renal sparing protocol but he has to be 6-9 months post transplant. She also suggested that he go back to regular GI due to +stool cards for possible colonoscopy or flex-sig. Dr. Damita Dunnings placed referral.

## 2016-06-08 ENCOUNTER — Ambulatory Visit (INDEPENDENT_AMBULATORY_CARE_PROVIDER_SITE_OTHER): Payer: BLUE CROSS/BLUE SHIELD | Admitting: Cardiology

## 2016-06-08 ENCOUNTER — Encounter: Payer: Self-pay | Admitting: Cardiology

## 2016-06-08 VITALS — BP 126/72 | HR 88 | Ht 67.0 in | Wt 258.0 lb

## 2016-06-08 DIAGNOSIS — E784 Other hyperlipidemia: Secondary | ICD-10-CM | POA: Diagnosis not present

## 2016-06-08 DIAGNOSIS — N289 Disorder of kidney and ureter, unspecified: Secondary | ICD-10-CM

## 2016-06-08 DIAGNOSIS — K7460 Unspecified cirrhosis of liver: Secondary | ICD-10-CM

## 2016-06-08 DIAGNOSIS — E7849 Other hyperlipidemia: Secondary | ICD-10-CM

## 2016-06-08 DIAGNOSIS — I5033 Acute on chronic diastolic (congestive) heart failure: Secondary | ICD-10-CM

## 2016-06-08 DIAGNOSIS — R0609 Other forms of dyspnea: Secondary | ICD-10-CM

## 2016-06-08 DIAGNOSIS — R0600 Dyspnea, unspecified: Secondary | ICD-10-CM

## 2016-06-08 DIAGNOSIS — I10 Essential (primary) hypertension: Secondary | ICD-10-CM | POA: Diagnosis not present

## 2016-06-08 DIAGNOSIS — E782 Mixed hyperlipidemia: Secondary | ICD-10-CM

## 2016-06-08 DIAGNOSIS — Z79899 Other long term (current) drug therapy: Secondary | ICD-10-CM | POA: Diagnosis not present

## 2016-06-08 DIAGNOSIS — R188 Other ascites: Secondary | ICD-10-CM

## 2016-06-08 LAB — BRAIN NATRIURETIC PEPTIDE: Brain Natriuretic Peptide: 20.3 pg/mL (ref <100)

## 2016-06-08 LAB — COMPREHENSIVE METABOLIC PANEL
ALT: 13 U/L (ref 9–46)
AST: 13 U/L (ref 10–40)
Albumin: 4.2 g/dL (ref 3.6–5.1)
Alkaline Phosphatase: 64 U/L (ref 40–115)
BUN: 23 mg/dL (ref 7–25)
CO2: 22 mmol/L (ref 20–31)
Calcium: 8.8 mg/dL (ref 8.6–10.3)
Chloride: 107 mmol/L (ref 98–110)
Creat: 1.51 mg/dL — ABNORMAL HIGH (ref 0.60–1.35)
Glucose, Bld: 125 mg/dL — ABNORMAL HIGH (ref 65–99)
Potassium: 4.9 mmol/L (ref 3.5–5.3)
Sodium: 139 mmol/L (ref 135–146)
Total Bilirubin: 0.6 mg/dL (ref 0.2–1.2)
Total Protein: 6.7 g/dL (ref 6.1–8.1)

## 2016-06-08 MED ORDER — SPIRONOLACTONE 25 MG PO TABS: 25.0000 mg | ORAL_TABLET | Freq: Every day | ORAL | 3 refills | Status: DC

## 2016-06-08 MED ORDER — FUROSEMIDE 40 MG PO TABS: 40.0000 mg | ORAL_TABLET | Freq: Two times a day (BID) | ORAL | 3 refills | Status: DC

## 2016-06-08 NOTE — Patient Instructions (Signed)
Medication Instructions:   START TAKING LASIX 40 MG BY MOUTH TWICE DAILY---TAKE ONE DOSE AT 8 AM AND THE SECOND DOSE 2 PM   START TAKING SPIRONOLACTONE 25 MG ONCE DAILY   Labwork:  TODAY--CMET AND BNP    Testing/Procedures:  Your physician has requested that you have an echocardiogram. Echocardiography is a painless test that uses sound waves to create images of your heart. It provides your doctor with information about the size and shape of your heart and how well your heart's chambers and valves are working. This procedure takes approximately one hour. There are no restrictions for this procedure.    Follow-Up:  1 WEEK WITH AN EXTENDER IN OUR OFFICE      If you need a refill on your cardiac medications before your next appointment, please call your pharmacy.

## 2016-06-08 NOTE — Progress Notes (Signed)
Patient ID: Colton Porter, male   DOB: 1965-12-07, 50 y.o.   MRN: [id number]       Cardiology Office Note  Date:  06/09/2016   ID:  Colton Porter, DOB August 08, 1965, MRN [id number]  PCP:  Ria Bush, MD  Cardiologist:  Dr. Meda Coffee   No chief complaint on file.  History of Present Illness: Colton Porter is a 50 y.o. male who presents for post ED visit followup. He has PMH of hyperlipidemia, CKD stage III, obesity, OSA on CPAP since 06/2013, hypertension, chronic thrombocytopenia with platelet around 50, significant drinking history with subsequent alcoholic cirrhosis. He was recently seen by Dr. Meda Coffee on 05/04/2015. He never smoked. He drinks 12-18 beers a day chronically and has no elevation of liver enzyme. He has family history of early CAD with his brother having first MI at age 63. He had left and right heart catheterization on 06/10/2013 which showed normal coronaries, elevated right heart pressure likely related to diastolic dysfunction.  06/08/2016 - the patient underwent liver transplant in July 2017 and is being followed by [facility] liver transplant team. Shortly after transplant he has lost 40 lbs but then gained 45 back. He states that he feels SOB just walking to the bathroom so he eliminated all the activities. He complains of LE edema, but denies orthopnea or PND. He sleeps with a CPAP. He was previously taking Lasix but that was discontinued because of elevated Crea. He denies CP, no fever, chills. No palpitations or syncope. He is on Tacrolimus and Prednisone, Mycophenolate.   Past Medical History:  Diagnosis Date  . Alcohol dependence ([place]) 07/11/2012  . Alcoholic cirrhosis of liver with ascites ([place]) 07/2014   s./p transplant 12/2015  . Allergy   . Anemia   . Cellulitis of left leg   . Chronic diastolic heart failure ([place]) 11/01/2013  . CKD (chronic kidney disease) stage 3, GFR 30-59 ml/min 11/04/2015  . GERD (gastroesophageal reflux disease)   . Hyperlipidemia   .  Hypertension   . Neuropathy ([place])   . OSA on CPAP 07/11/2012   HST 07/2013:  AHI 39/hr.    . Thrombocytopenia ([place]) 12/15/2011    Past Surgical History:  Procedure Laterality Date  . COLONOSCOPY  08/2013   hyperplastic polyps, hemorrhoids Ardis Hughs)  . 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 HAND SURGERY  1997  . LEFT AND RIGHT HEART CATHETERIZATION WITH CORONARY ANGIOGRAM N/A 06/10/2013   Procedure: LEFT AND RIGHT HEART CATHETERIZATION WITH CORONARY ANGIOGRAM;  Surgeon: Blane Ohara, MD;  Location: [facility] CATH LAB;  Service: Cardiovascular;  Laterality: N/A;  . LIVER TRANSPLANTATION  0000000   alcoholic cirrhosis ([facility] at [facility])     Current Outpatient Prescriptions  Medication Sig Dispense Refill  . acetaminophen (TYLENOL) 500 MG tablet Take 500 mg by mouth every 6 (six) hours as needed for mild pain.    Marland Kitchen amLODipine (NORVASC) 10 MG tablet Take 10 mg by mouth daily.    Marland Kitchen aspirin 81 MG chewable tablet Chew 81 mg by mouth daily.    . cetirizine (ZYRTEC) 10 MG tablet Take 10 mg by mouth.    . doxazosin (CARDURA) 8 MG tablet   6  . losartan (COZAAR) 50 MG tablet Take 50 mg by mouth 2 (two) times daily at 10 AM and 5 PM.  0  . magnesium oxide (MAG-OX) 400 (241.3 Mg) MG tablet Take 1 tablet (400 mg total) by mouth 2 (two) times daily.    Marland Kitchen  Melatonin 3 MG TABS Take 1 tablet by mouth at bedtime.    . Multiple Vitamin (MULTIVITAMIN WITH MINERALS) TABS tablet Take 2 tablets by mouth every evening.    . mycophenolate (MYFORTIC) 360 MG TBEC EC tablet Take 720 mg by mouth 2 (two) times daily.    Marland Kitchen omeprazole (PRILOSEC) 20 MG capsule Take 20 mg by mouth 2 (two) times daily.  11  . predniSONE (DELTASONE) 5 MG tablet Take 10 mg by mouth daily with breakfast.    . sulfamethoxazole-trimethoprim (BACTRIM,SEPTRA) 400-80 MG tablet Take 1 tablet by mouth 3 (three) times a week. M,W,F    . tacrolimus (PROGRAF) 1 MG capsule Take 1 mg by mouth as directed. 3  capsules every AM and 2 capsules every PM    . tamsulosin (FLOMAX) 0.4 MG CAPS capsule Take 0.4 mg by mouth daily.    . valGANciclovir (VALCYTE) 450 MG tablet Take 450 mg by mouth daily.    . furosemide (LASIX) 40 MG tablet Take 1 tablet (40 mg total) by mouth 2 (two) times daily. 180 tablet 3  . spironolactone (ALDACTONE) 25 MG tablet Take 1 tablet (25 mg total) by mouth daily. 90 tablet 3   No current facility-administered medications for this visit.     Allergies:   Nsaids; Tylenol [acetaminophen]; Ambien [zolpidem tartrate]; Morphine and related; Hydrochlorothiazide w-triamterene; and Lisinopril    Social History:  The patient  reports that he has never smoked. He has quit using smokeless tobacco. His smokeless tobacco use included Chew. He reports that he uses drugs. He reports that he does not drink alcohol.   Family History:  The patient's family history includes Emphysema in his father and mother; Heart disease in his father; Hypertension in his father; Lung cancer in his maternal aunt; Stroke in his mother and paternal grandmother.    ROS:  Please see the history of present illness.   Otherwise, review of systems are positive for L sided muscle ache. Trace edema.   All other systems are reviewed and negative.    PHYSICAL EXAM: VS:  BP 126/72   Pulse 88   Ht 5\' 7"  (1.702 m)   Wt 258 lb (117 kg)   BMI 40.41 kg/m  , BMI Body mass index is 40.41 kg/m. GEN: Well nourished, well developed, in no acute distress, significant jaundice  HEENT: normal  Neck: no JVD, carotid bruits, or masses Cardiac: RRR; no murmurs, rubs, or gallops. Mild edema. Bilaterally Respiratory:  clear to auscultation bilaterally, normal work of breathing GI: soft, nontender, distended, + BS MS: no deformity or atrophy  Skin: warm and dry, no rash Neuro:  Strength and sensation are intact Psych: euthymic mood, full affect  EKG:  EKG is not ordered today as he had an EKG in the ED yesterday, still has TWI  in lead III, unchanged compare to previous EKG  Recent Labs: 10/26/2015: Magnesium 1.4; TSH 0.912 05/17/2016: Hemoglobin 9.7; Platelets 122.0 06/08/2016: ALT 13; Brain Natriuretic Peptide 20.3; BUN 23; Creat 1.51; Potassium 4.9; Sodium 139    Lipid Panel    Component Value Date/Time   CHOL 179 09/07/2015 1000   TRIG 130.0 09/07/2015 1000   HDL 37.10 (L) 09/07/2015 1000   CHOLHDL 5 09/07/2015 1000   VLDL 26.0 09/07/2015 1000   LDLCALC 116 (H) 09/07/2015 1000   LDLDIRECT 115.5 11/16/2011 0923      Wt Readings from Last 3 Encounters:  06/08/16 258 lb (117 kg)  05/17/16 277 lb (125.6 kg)  05/04/16 264 lb (  119.7 kg)    TTE: 11/26/2014 - Left ventricle: The cavity size was normal. Wall thickness was normal. Systolic function was normal. The estimated ejection fraction was in the range of 60% to 65%. Wall motion was normal; there were no regional wall motion abnormalities. - Left atrium: The atrium was mildly dilated. - Right atrium: The atrium was mildly dilated.  Other studies Reviewed: Additional studies/ records that were reviewed today include: Recent ED visit x 2, last cardiology note, previous cath report. Review of the above records demonstrates: Normal coronaries on cath 2014, recently tried to increase lasix twice however had to go back to 40mg  BID due to worsening renal function.     ASSESSMENT AND PLAN:  1.  DOE - the patient has h/o chronic diastolic CHF, today he has mild LE edema, but his lungs are clear, he also denies orthopnea, PND.  - today's BNP is 20 - I will start lasix 40 mg po BID and spironolactone 25 mg po daily. - Follow up in 1-2 weeks, if no improvement consider a stress test to rule out ischemia, however he had a normal left cardiac cath in 2014. - This might be multifactorial sec to deconditioning post transplant and significant weight gain (note that his weight was recorded as 258 however it was 285 lbs. - we will repeat echocardiogram  2.  Hypertension - controlled.  3. Hyperlipidemia: not on statin given recent liver transplant  4. CKD stage III: Crea 1.8-->1.5--> 1.38   5. Obesity - it is essential that he looses weight  6. OSA on CPAP since 06/2013  7. Significant drinking history with subsequent alcoholic cirrhosis: s/p transplant at [facility], on Prednisone, mycophenolate and tacrolimus.  Follow-up in 1-2 weeks.   Signed, Ena Dawley, MD  06/09/2016 12:11 AM    [address], [place], [place]  [address] Phone: [phone]; Fax: [phone]

## 2016-06-09 ENCOUNTER — Other Ambulatory Visit: Payer: Self-pay

## 2016-06-09 ENCOUNTER — Ambulatory Visit (HOSPITAL_COMMUNITY): Payer: BLUE CROSS/BLUE SHIELD | Attending: Cardiology

## 2016-06-09 DIAGNOSIS — I5033 Acute on chronic diastolic (congestive) heart failure: Secondary | ICD-10-CM | POA: Diagnosis not present

## 2016-06-09 DIAGNOSIS — I517 Cardiomegaly: Secondary | ICD-10-CM | POA: Diagnosis not present

## 2016-06-09 DIAGNOSIS — R9439 Abnormal result of other cardiovascular function study: Secondary | ICD-10-CM | POA: Diagnosis not present

## 2016-06-09 DIAGNOSIS — I10 Essential (primary) hypertension: Secondary | ICD-10-CM | POA: Diagnosis not present

## 2016-06-10 ENCOUNTER — Ambulatory Visit (INDEPENDENT_AMBULATORY_CARE_PROVIDER_SITE_OTHER)
Admission: RE | Admit: 2016-06-10 | Discharge: 2016-06-10 | Disposition: A | Discharge status: Home / Self Care | Payer: BLUE CROSS/BLUE SHIELD | Source: Ambulatory Visit | Attending: Gastroenterology | Admitting: Gastroenterology

## 2016-06-10 ENCOUNTER — Ambulatory Visit (INDEPENDENT_AMBULATORY_CARE_PROVIDER_SITE_OTHER): Payer: BLUE CROSS/BLUE SHIELD | Admitting: Gastroenterology

## 2016-06-10 ENCOUNTER — Encounter: Payer: Self-pay | Admitting: Gastroenterology

## 2016-06-10 ENCOUNTER — Other Ambulatory Visit (INDEPENDENT_AMBULATORY_CARE_PROVIDER_SITE_OTHER): Payer: BLUE CROSS/BLUE SHIELD

## 2016-06-10 VITALS — BP 100/60 | HR 96 | Ht 66.5 in | Wt 283.4 lb

## 2016-06-10 DIAGNOSIS — R0609 Other forms of dyspnea: Secondary | ICD-10-CM

## 2016-06-10 DIAGNOSIS — R0602 Shortness of breath: Secondary | ICD-10-CM | POA: Diagnosis not present

## 2016-06-10 DIAGNOSIS — R188 Other ascites: Secondary | ICD-10-CM

## 2016-06-10 LAB — CBC WITH DIFFERENTIAL/PLATELET
BASOS ABS: 0 10*3/uL (ref 0.0–0.1)
BASOS PCT: 0.5 % (ref 0.0–3.0)
EOS ABS: 0 10*3/uL (ref 0.0–0.7)
Eosinophils Relative: 0.9 % (ref 0.0–5.0)
HCT: 32.2 % — ABNORMAL LOW (ref 39.0–52.0)
Hemoglobin: 10.8 g/dL — ABNORMAL LOW (ref 13.0–17.0)
LYMPHS ABS: 0.5 10*3/uL — AB (ref 0.7–4.0)
Lymphocytes Relative: 20.5 % (ref 12.0–46.0)
MCHC: 33.3 g/dL (ref 30.0–36.0)
MCV: 79.7 fl (ref 78.0–100.0)
MONO ABS: 0.2 10*3/uL (ref 0.1–1.0)
Monocytes Relative: 9.3 % (ref 3.0–12.0)
NEUTROS ABS: 1.7 10*3/uL (ref 1.4–7.7)
NEUTROS PCT: 68.8 % (ref 43.0–77.0)
PLATELETS: 128 10*3/uL — AB (ref 150.0–400.0)
RBC: 4.04 Mil/uL — ABNORMAL LOW (ref 4.22–5.81)
RDW: 16.6 % — AB (ref 11.5–15.5)
WBC: 2.5 10*3/uL — ABNORMAL LOW (ref 4.0–10.5)

## 2016-06-10 NOTE — Patient Instructions (Addendum)
PA and lateral CXR for SOB, DOE.   Head down to the basement level for chest xray when you leave here today.   Abdominal US to check for ascites.    You have been scheduled for an abdominal ultrasound at [facility] (1st floor of hospital) on 06/16/16 at 8 am. Please arrive 15 minutes prior to your appointment for registration. Make certain not to have anything to eat or drink 6 hours prior to your appointment. Should you need to reschedule your appointment, please contact radiology at [phone]. This test typically takes about 30 minutes to perform.  You will have labs checked today in the basement lab.  Please head down after you check out with the front desk  (cbc).  Pending the above tests may refer to pulmonary.  Will probably await final cardiac testing (possible stress test in near future).

## 2016-06-10 NOTE — Progress Notes (Signed)
Review of pertinent gastrointestinal problems: 1. 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 INR 1.5, low platelets, + mild ascites; Hep B surface Ag neg, Hep B surface Ab neg, HCV Ab neg, HIV neg, ceruloplasmin level normal, AMA negative, AMA negative, anti-smooth muscle antibody negative.  Last alcoholic beverage April [id number]  Imaging: [name] 3/16 showed cirrhosis, splenomegaly, no discrete liver lesions; [name] for hepatoma screening 02/2015: showed cirrhosis without masses; next screening 08/2014  AFP 08/2014 7.5 (slightly elevated)  EGD 09/2014 no esophageal or gastric varices, + mild portal gastropathy (plan to rescreen 2019)  Established care at [facility] liver disease clinic, was being referred to [facility] liver transplant workup  (Dr. Patsy [place])  Immunization for hepatitis A, hepatitis B initiated 7 2016  LIVER TRANSPLANT AT [facility] 12/2015 2. Routine risk for colon cancer: colonoscopy Dr. Ardis Hughs 08/2013 for minor rectal bleeding: 3 polyps (all HPs), hemorrhoids. Recall colonoscopy at 10 year interval recommended.   HPI: This is a    very pleasant 50 year old man whom I last saw about a year and a half ago  He underwent orthotopic liver transplant July 2017 at [facility] he has gained 40-50 pounds since his liver transplant  Chief complaint is shortness of breath Hemoccult-positive stool  Liver tranpslant in 12/2015;  Echo yesterday; was told it looks great.  Normal LVEF  Has bleeding from hemorrhoids; for a long time.  He sees blood every day with BM.  Has been gaining weight 40-50 pounds since the liver transplant.  Has been complaining of SOB, was found to have anemia. FOBT + stools.  ROS: complete GI ROS as described in HPI.  Constitutional:  No unintentional weight loss   Past Medical History:  Diagnosis Date  . Alcohol dependence ([place]) 07/11/2012  . Alcoholic cirrhosis of liver with ascites ([place])  07/2014   s./p transplant 12/2015  . Allergy   . Anemia   . Cellulitis of left leg   . Chronic diastolic heart failure ([place]) 11/01/2013  . CKD (chronic kidney disease) stage 3, GFR 30-59 ml/min 11/04/2015  . GERD (gastroesophageal reflux disease)   . Hyperlipidemia   . Hypertension   . Neuropathy ([place])   . OSA on CPAP 07/11/2012   HST 07/2013:  AHI 39/hr.    . Thrombocytopenia ([place]) 12/15/2011    Past Surgical History:  Procedure Laterality Date  . COLONOSCOPY  08/2013   hyperplastic polyps, hemorrhoids Ardis Hughs)  . 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 HAND SURGERY  1997  . LEFT AND RIGHT HEART CATHETERIZATION WITH CORONARY ANGIOGRAM N/A 06/10/2013   Procedure: LEFT AND RIGHT HEART CATHETERIZATION WITH CORONARY ANGIOGRAM;  Surgeon: Blane Ohara, MD;  Location: [facility] CATH LAB;  Service: Cardiovascular;  Laterality: N/A;  . LIVER TRANSPLANTATION  0000000   alcoholic cirrhosis ([facility] at [facility])    Current Outpatient Prescriptions  Medication Sig Dispense Refill  . acetaminophen (TYLENOL) 500 MG tablet Take 500 mg by mouth every 6 (six) hours as needed for mild pain.    Marland Kitchen amLODipine (NORVASC) 10 MG tablet Take 10 mg by mouth daily.    Marland Kitchen aspirin 81 MG chewable tablet Chew 81 mg by mouth daily.    . cetirizine (ZYRTEC) 10 MG tablet Take 10 mg by mouth.    . doxazosin (CARDURA) 8 MG tablet   6  . furosemide (LASIX) 40 MG tablet Take 1 tablet (40 mg total) by mouth 2 (two)  times daily. 180 tablet 3  . losartan (COZAAR) 50 MG tablet Take 50 mg by mouth 2 (two) times daily at 10 AM and 5 PM.  0  . magnesium oxide (MAG-OX) 400 (241.3 Mg) MG tablet Take 1 tablet (400 mg total) by mouth 2 (two) times daily.    . Melatonin 3 MG TABS Take 1 tablet by mouth at bedtime.    . Multiple Vitamin (MULTIVITAMIN WITH MINERALS) TABS tablet Take 2 tablets by mouth every evening.    . mycophenolate (MYFORTIC) 360 MG TBEC EC tablet Take 720 mg by mouth 2 (two)  times daily.    Marland Kitchen omeprazole (PRILOSEC) 20 MG capsule Take 20 mg by mouth 2 (two) times daily.  11  . predniSONE (DELTASONE) 5 MG tablet Take 10 mg by mouth daily with breakfast.    . spironolactone (ALDACTONE) 25 MG tablet Take 1 tablet (25 mg total) by mouth daily. 90 tablet 3  . sulfamethoxazole-trimethoprim (BACTRIM,SEPTRA) 400-80 MG tablet Take 1 tablet by mouth 3 (three) times a week. M,W,F    . tacrolimus (PROGRAF) 1 MG capsule Take 1 mg by mouth as directed. 3 capsules every AM and 2 capsules every PM    . tamsulosin (FLOMAX) 0.4 MG CAPS capsule Take 0.4 mg by mouth daily.    . valGANciclovir (VALCYTE) 450 MG tablet Take 450 mg by mouth daily.     No current facility-administered medications for this visit.     Allergies as of 06/10/2016 - Review Complete 06/10/2016  Allergen Reaction Noted  . Nsaids Other (See Comments) 12/27/2014  . Tylenol [acetaminophen] Other (See Comments) 12/27/2014  . Ambien [zolpidem tartrate] Other (See Comments) 03/05/2015  . Morphine and related  11/20/2015  . Hydrochlorothiazide w-triamterene Other (See Comments) 08/07/2006  . Lisinopril Other (See Comments) 08/07/2006    Family History  Problem Relation Age of Onset  . Stroke Mother   . Emphysema Mother   . Hypertension Father   . Heart disease Father   . Emphysema Father   . Lung cancer Maternal Aunt   . Stroke Paternal Grandmother   . Heart attack Neg Hx     Social History   Social History  . Marital status: Married    Spouse name: N/A  . Number of children: 2  . Years of education: N/A   Occupational History  . [employer]     no work lately   Social History Main Topics  . Smoking status: Never Smoker  . Smokeless tobacco: Former Systems developer    Types: Chew  . Alcohol use No     Comment: 4-6 drinks daily - NO ETOH SINCE APRIL 2016  . Drug use:      Comment: remote use of marijuana in the past, has since quit  . Sexual activity: No   Other Topics Concern  . Not on file    Social History Narrative    Lives with wife   Occupation: full disability after cirrhosis dx, some working    Activity: some yardwork   Diet: good water, good fruit intake, lots of red meats     Physical Exam: BP 100/60 (BP Location: Left Arm, Patient Position: Sitting, Cuff Size: Normal)   Pulse 96   Ht 5' 6.5" (1.689 m) Comment: height measured without shoes  Wt 283 lb 6 oz (128.5 kg)   BMI 45.05 kg/m  Constitutional: generally well-appearing Psychiatric: alert and oriented x3 Abdomen: soft, nontender, nondistended, no obvious ascites, no peritoneal signs, normal bowel sounds No  peripheral edema noted in lower extremities Rectal exam showed no obvious external anal hemorrhoids, some small piles externally, no distal rectal masses, stool is brown and not checked for Hemoccult  Assessment and plan: 50 y.o. male with Hemoccult-positive stools, dyspnea on exertion, shortness of breath, mild anemia  First I do not think that his anemia with a hemoglobin around 10 is responsible for his significant shortness of breath and dyspnea on exertion.  He has Hemoccult-positive stools but I don't think this needs to be evaluated right now with endoscopic procedures since he had a colonoscopy in 2016 an upper endoscopy year before. He does have some small amount of rectal bleeding with bowel movements and these I suspect are from small internal hemorrhoids which were noted on previous colonoscopy. He is undergoing cardiac workup and according to office note he might be having another cardiac stress test in the near future. I'm going to order a PA and lateral chest x-ray for shortness of breath. He has a protuberant abdomen I don't Bacillus think has ascites going to check to be certain with abdominal ultrasound. He will also get a repeat set of labs including a CBC. If the cardiac workup and all the other workup are negative for cause of his shortness of breath that I think he will need a formal  pulmonary evaluation.   Owens Loffler, MD [facility] 06/10/2016, 2:15 PM

## 2016-06-14 ENCOUNTER — Ambulatory Visit (INDEPENDENT_AMBULATORY_CARE_PROVIDER_SITE_OTHER): Payer: BLUE CROSS/BLUE SHIELD | Admitting: Cardiology

## 2016-06-14 ENCOUNTER — Telehealth: Payer: Self-pay | Admitting: *Deleted

## 2016-06-14 ENCOUNTER — Encounter: Payer: Self-pay | Admitting: Cardiology

## 2016-06-14 VITALS — BP 124/80 | HR 80 | Ht 66.5 in | Wt 285.0 lb

## 2016-06-14 DIAGNOSIS — R0602 Shortness of breath: Secondary | ICD-10-CM | POA: Diagnosis not present

## 2016-06-14 DIAGNOSIS — Z5181 Encounter for therapeutic drug level monitoring: Secondary | ICD-10-CM | POA: Diagnosis not present

## 2016-06-14 LAB — BASIC METABOLIC PANEL
BUN: 33 mg/dL — ABNORMAL HIGH (ref 7–25)
CO2: 24 mmol/L (ref 20–31)
Calcium: 9.2 mg/dL (ref 8.6–10.3)
Chloride: 103 mmol/L (ref 98–110)
Creat: 2.06 mg/dL — ABNORMAL HIGH (ref 0.60–1.35)
GLUCOSE: 122 mg/dL — AB (ref 65–99)
POTASSIUM: 4.3 mmol/L (ref 3.5–5.3)
SODIUM: 139 mmol/L (ref 135–146)

## 2016-06-14 LAB — CBC WITH DIFFERENTIAL/PLATELET
BASOS ABS: 33 {cells}/uL (ref 0–200)
Basophils Relative: 3 %
EOS ABS: 44 {cells}/uL (ref 15–500)
Eosinophils Relative: 4 %
HEMATOCRIT: 32.5 % — AB (ref 38.5–50.0)
HEMOGLOBIN: 10.5 g/dL — AB (ref 13.2–17.1)
LYMPHS ABS: 495 {cells}/uL — AB (ref 850–3900)
Lymphocytes Relative: 45 %
MCH: 26.1 pg — AB (ref 27.0–33.0)
MCHC: 32.3 g/dL (ref 32.0–36.0)
MCV: 80.8 fL (ref 80.0–100.0)
MONO ABS: 187 {cells}/uL — AB (ref 200–950)
MPV: 11.1 fL (ref 7.5–12.5)
Monocytes Relative: 17 %
NEUTROS PCT: 31 %
Neutro Abs: 341 cells/uL — CL (ref 1500–7800)
Platelets: 118 10*3/uL — ABNORMAL LOW (ref 140–400)
RBC: 4.02 MIL/uL — ABNORMAL LOW (ref 4.20–5.80)
RDW: 16.5 % — ABNORMAL HIGH (ref 11.0–15.0)
WBC: 1.1 10*3/uL — ABNORMAL LOW (ref 3.8–10.8)

## 2016-06-14 NOTE — Progress Notes (Signed)
06/14/2016 Colton Porter   [birth year]  [id number]  Primary Physician Colton Bush, MD Primary Cardiologist: Dr. Meda Porter  Reason for Visit/CC: f/u for Exertional Dyspnea  HPI:  Colton Porter is a 50 y.o. male, followed by Dr. Meda Porter, who presents to clinic for f/u.  He has PMH of hyperlipidemia, CKD stage III, obesity, OSA on CPAP since 06/2013, hypertension, chronic diastolic dysfunction, chronic thrombocytopenia with platelet around 50, significant drinking history with subsequent alcoholic cirrhosis. He underwent liver transplant in July 2017 and is being followed by [facility] liver [facility] team . He has family history of early CAD with his brother having first MI at age 41. He had left and right heart catheterization on 06/10/2013 which showed normal coronaries, elevated right heart pressure likely related to diastolic dysfunction.  He presents back to clinic for 1 week f/u. He was recently seen by Dr. Meda Porter on 06/08/16 and complained of new DOE and LEE. Also with abdominal distention but no orthopnea or PND.  Dr. Meda Porter started him on 40 mg of Lasix BID and 25 mg of spironolactone daily. He was ordered to get a repeat 2D echo. This was performed on 06/09/16 and demonstrated normal LVEF at 55-60% with grade 2DD. No wall motion abnormalities. No significant valvular dysfunction nor evidence of pericardial effusion. PA pressure could not be adequately estimated. He also had basic labs including a BNP that was normal at 20. CMP showed a SCr of 1.51 mg/dL. CBC also showed anemia with Hgb of 10.8. On 12/6 he had a positive FOBT. He is being followed by GI.    He notes no significant improvement. He still has DOE, fatigue, dizziness and palpitations. No energy. He does not feel like doing anything. He has increased abdominal girth. ? Ascities. He is scheduled for an abdominal US in 2 days to assess. He also had a recent CXR which showed signs of chronic bronchitis. He also notes intermitted pain  between his shoulder blades. No anterior CP. VSS. BP is 124/80. Pulse rate is 80 bpm.   Current Meds  Medication Sig  . acetaminophen (TYLENOL) 500 MG tablet Take 500 mg by mouth every 6 (six) hours as needed for mild pain.  Marland Kitchen amLODipine (NORVASC) 10 MG tablet Take 10 mg by mouth daily.  Marland Kitchen aspirin 81 MG chewable tablet Chew 81 mg by mouth daily.  . cetirizine (ZYRTEC) 10 MG tablet Take 10 mg by mouth.  . doxazosin (CARDURA) 8 MG tablet   . furosemide (LASIX) 40 MG tablet Take 1 tablet (40 mg total) by mouth 2 (two) times daily.  Marland Kitchen losartan (COZAAR) 50 MG tablet Take 50 mg by mouth 2 (two) times daily at 10 AM and 5 PM.  . magnesium oxide (MAG-OX) 400 (241.3 Mg) MG tablet Take 1 tablet (400 mg total) by mouth 2 (two) times daily.  . Melatonin 3 MG TABS Take 1 tablet by mouth at bedtime.  . Multiple Vitamin (MULTIVITAMIN WITH MINERALS) TABS tablet Take 2 tablets by mouth every evening.  . mycophenolate (MYFORTIC) 360 MG TBEC EC tablet Take 720 mg by mouth 2 (two) times daily.  Marland Kitchen omeprazole (PRILOSEC) 20 MG capsule Take 20 mg by mouth 2 (two) times daily.  . predniSONE (DELTASONE) 5 MG tablet Take 10 mg by mouth daily with breakfast.  . spironolactone (ALDACTONE) 25 MG tablet Take 1 tablet (25 mg total) by mouth daily.  Marland Kitchen sulfamethoxazole-trimethoprim (BACTRIM,SEPTRA) 400-80 MG tablet Take 1 tablet by mouth 3 (three) times a week. M,W,F  .  tacrolimus (PROGRAF) 1 MG capsule Take 1 mg by mouth as directed. 3 capsules every AM and 2 capsules every PM  . tamsulosin (FLOMAX) 0.4 MG CAPS capsule Take 0.4 mg by mouth daily.  . valGANciclovir (VALCYTE) 450 MG tablet Take 450 mg by mouth daily.   Allergies  Allergen Reactions  . Nsaids Other (See Comments)    cirrhosis  . Tylenol [Acetaminophen] Other (See Comments)    cirrhosis  . Ambien [Zolpidem Tartrate] Other (See Comments)    Over-toxicity from liver failure  . Morphine And Related     Hallucinations.  . Hydrochlorothiazide W-Triamterene  Other (See Comments)    REACTION: dizzy, nausea  . Lisinopril Other (See Comments)    REACTION: cough, decreased libido   Past Medical History:  Diagnosis Date  . Alcohol dependence ([place]) 07/11/2012  . Alcoholic cirrhosis of liver with ascites ([place]) 07/2014   s./p transplant 12/2015  . Allergy   . Anemia   . Cellulitis of left leg   . Chronic diastolic heart failure ([place]) 11/01/2013  . CKD (chronic kidney disease) stage 3, GFR 30-59 ml/min 11/04/2015  . GERD (gastroesophageal reflux disease)   . Hyperlipidemia   . Hypertension   . Neuropathy ([place])   . OSA on CPAP 07/11/2012   HST 07/2013:  AHI 39/hr.    . Thrombocytopenia ([place]) 12/15/2011   Family History  Problem Relation Age of Onset  . Stroke Mother   . Emphysema Mother   . Hypertension Father   . Heart disease Father   . Emphysema Father   . Lung cancer Maternal Aunt   . Stroke Paternal Grandmother   . Heart attack Neg Hx    Past Surgical History:  Procedure Laterality Date  . COLONOSCOPY  08/2013   hyperplastic polyps, hemorrhoids Colton Porter)  . 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 HAND SURGERY  1997  . LEFT AND RIGHT HEART CATHETERIZATION WITH CORONARY ANGIOGRAM N/A 06/10/2013   Procedure: LEFT AND RIGHT HEART CATHETERIZATION WITH CORONARY ANGIOGRAM;  Surgeon: Colton Ohara, MD;  Location: [facility] CATH LAB;  Service: Cardiovascular;  Laterality: N/A;  . LIVER TRANSPLANTATION  0000000   alcoholic cirrhosis ([facility])   Social History   Social History  . Marital status: Married    Spouse name: N/A  . Number of children: 2  . Years of education: N/A   Occupational History  . [employer]     no work lately   Social History Main Topics  . Smoking status: Never Smoker  . Smokeless tobacco: Former Systems developer    Types: Chew  . Alcohol use No     Comment: 4-6 drinks daily - NO ETOH SINCE APRIL 2016  . Drug use:      Comment: remote use of marijuana in the past,  has since quit  . Sexual activity: No   Other Topics Concern  . Not on file   Social History Narrative    Lives with wife   Occupation: full disability after cirrhosis dx, some working    Activity: some yardwork   Diet: good water, good fruit intake, lots of red meats     Review of Systems: General: negative for chills, fever, night sweats or weight changes.  Cardiovascular: negative for chest pain, dyspnea on exertion, edema, orthopnea, palpitations, paroxysmal nocturnal dyspnea or shortness of breath Dermatological: negative for rash Respiratory: negative for cough or wheezing Urologic: negative for hematuria Abdominal: negative for nausea, vomiting, diarrhea, bright red  blood per rectum, melena, or hematemesis Neurologic: negative for visual changes, syncope, or dizziness All other systems reviewed and are otherwise negative except as noted above.   Physical Exam:  Blood pressure 124/80, pulse 80, height 5' 6.5" (1.689 m), weight 285 lb (129.3 kg).  General appearance: alert, cooperative, no distress and moderately obese Neck: no carotid bruit and no JVD Lungs: clear to auscultation bilaterally Heart: regular rate and rhythm, S1, S2 normal, no murmur, click, rub or gallop Extremities: extremities normal, atraumatic, no cyanosis or edema Pulses: 2+ and symmetric Skin: Skin color, texture, turgor normal. No rashes or lesions Neurologic: Grossly normal  EKG not performed   ASSESSMENT AND PLAN:    1. DOE: recent echo 06/09/16 showed normal LV systolic function and normal wall motion with EF of 55-60%. No pericardial effusion nor notable valvular dysfunction.  Grade 2DD was noted. BNP normal at 20. No improvement with diuretics. We will check a repeat BMP today to ensure renal function and K are ok. He was noted recently to have anemia with a Hgb of 10.8 and + FOBT earlier this month. I feel his symptoms are more consistent with symptomatic anemia. He still has DOE, fatigue,  dizziness and palpitations. No energy. He does not feel like doing anything. He was told by his GI MD, Dr. Ardis Porter, on Friday to take a multivitamin with Fe. We will recheck a CBC today to ensure no further drop in Hgb. Given his other risk factors and mid scapular pain, we will also order a Lexiscan NST to r/o coronary ischemia. F/u in 1-2 weeks.    Lyda Jester PA-C 06/14/2016 8:34 AM

## 2016-06-14 NOTE — Patient Instructions (Signed)
Medication Instructions:  Your physician recommends that you continue on your current medications as directed. Please refer to the Current Medication list given to you today.   Labwork: BMET/CBCd today  Testing/Procedures: Your physician has requested that you have a lexiscan myoview. For further information please visit [email or web address]. Please follow instruction sheet, as given.    Follow-Up: Your physician recommends that you schedule a follow-up appointment in 2 weeks with Thamas Jaegers       If you need a refill on your cardiac medications before your next appointment, please call your pharmacy.

## 2016-06-15 NOTE — Telephone Encounter (Signed)
Result reviewed by provider on yesterday.

## 2016-06-16 ENCOUNTER — Telehealth: Payer: Self-pay | Admitting: *Deleted

## 2016-06-16 ENCOUNTER — Ambulatory Visit (HOSPITAL_COMMUNITY)
Admission: RE | Admit: 2016-06-16 | Discharge: 2016-06-16 | Disposition: A | Discharge status: Home / Self Care | Payer: BLUE CROSS/BLUE SHIELD | Source: Ambulatory Visit | Attending: Gastroenterology | Admitting: Gastroenterology

## 2016-06-16 ENCOUNTER — Encounter: Payer: Self-pay | Admitting: Family Medicine

## 2016-06-16 DIAGNOSIS — K769 Liver disease, unspecified: Secondary | ICD-10-CM | POA: Insufficient documentation

## 2016-06-16 DIAGNOSIS — R188 Other ascites: Secondary | ICD-10-CM | POA: Diagnosis not present

## 2016-06-16 DIAGNOSIS — R0602 Shortness of breath: Secondary | ICD-10-CM | POA: Insufficient documentation

## 2016-06-16 DIAGNOSIS — R7989 Other specified abnormal findings of blood chemistry: Secondary | ICD-10-CM

## 2016-06-16 DIAGNOSIS — R0609 Other forms of dyspnea: Secondary | ICD-10-CM | POA: Diagnosis not present

## 2016-06-16 DIAGNOSIS — R161 Splenomegaly, not elsewhere classified: Secondary | ICD-10-CM | POA: Diagnosis not present

## 2016-06-16 MED ORDER — HYDROCORTISONE ACETATE 25 MG RE SUPP: 25.0000 mg | Freq: Two times a day (BID) | RECTAL | 1 refills | Status: DC | PRN

## 2016-06-16 NOTE — Telephone Encounter (Signed)
-----   Message from Consuelo Pandy, [place] sent at 06/15/2016  3:57 PM EST ----- Original results reported wrong. His Scr is actually increased compared to his last BMP 1 week ago. Have him stop spironolactone. Repeat BMP in 7-10 days.

## 2016-06-16 NOTE — Telephone Encounter (Signed)
Sent in

## 2016-06-16 NOTE — Telephone Encounter (Signed)
plz call patient to triage and f/u - sounds like fissure vs hemorrhoid. How much bleeding noticed? If he thinks due to hemorrhoid, would offer steroid suppositories.  If ongoing pain, would offer tramadol - to take 1/2 tab to start.  Ensure good fiber and water to keep stools soft.  If significant bleed and ongoing pain, rec eval in office.

## 2016-06-16 NOTE — Telephone Encounter (Signed)
Spoke with patient. He felt certain it was hemorrhoids. BRB with wiping. He said Dr. Ardis Hughs told him no external ones but possible internal. He said he would like the suppositories and would like to have the tramadol on hand, but doesn't know if he will need much of it. I called in tramadol 50mg  1/2-1 BID PRN #[id number]. Please send in suppositories. Thanks!

## 2016-06-16 NOTE — Telephone Encounter (Signed)
Pt aware of his lab results. He will d/c Spironolactone and repeat bmp 06/24/16. Orders in EPIC.

## 2016-06-21 ENCOUNTER — Telehealth (HOSPITAL_COMMUNITY): Payer: Self-pay | Admitting: *Deleted

## 2016-06-21 NOTE — Telephone Encounter (Signed)
Patient given detailed instructions per Myocardial Perfusion Study Information Sheet for the test on 06/22/16 at 1230. Patient notified to arrive 15 minutes early and that it is imperative to arrive on time for appointment to keep from having the test rescheduled.  If you need to cancel or reschedule your appointment, please call the office within 24 hours of your appointment. Failure to do so may result in a cancellation of your appointment, and a $50 no show fee. Patient verbalized understanding.Shell Blanchette, Ranae Palms

## 2016-06-22 ENCOUNTER — Ambulatory Visit (HOSPITAL_COMMUNITY): Payer: BLUE CROSS/BLUE SHIELD | Attending: Internal Medicine

## 2016-06-22 DIAGNOSIS — Z5181 Encounter for therapeutic drug level monitoring: Secondary | ICD-10-CM | POA: Diagnosis not present

## 2016-06-22 DIAGNOSIS — R0602 Shortness of breath: Secondary | ICD-10-CM

## 2016-06-22 MED ORDER — REGADENOSON 0.4 MG/5ML IV SOLN
0.4000 mg | Freq: Once | INTRAVENOUS | Status: AC
  Administered 2016-06-22: 0.4 mg via INTRAVENOUS

## 2016-06-22 MED ORDER — TECHNETIUM TC 99M TETROFOSMIN IV KIT
31.7000 | PACK | Freq: Once | INTRAVENOUS | Status: AC | PRN
  Administered 2016-06-22: 31.7 via INTRAVENOUS
  Filled 2016-06-22: qty 32

## 2016-06-23 ENCOUNTER — Other Ambulatory Visit: Payer: BLUE CROSS/BLUE SHIELD

## 2016-06-23 ENCOUNTER — Ambulatory Visit (HOSPITAL_COMMUNITY): Payer: BLUE CROSS/BLUE SHIELD

## 2016-06-23 LAB — PULMONARY FUNCTION TEST

## 2016-06-24 ENCOUNTER — Other Ambulatory Visit: Payer: BLUE CROSS/BLUE SHIELD

## 2016-06-30 ENCOUNTER — Ambulatory Visit: Payer: BLUE CROSS/BLUE SHIELD | Admitting: Cardiology

## 2016-07-06 ENCOUNTER — Encounter (HOSPITAL_COMMUNITY): Payer: Self-pay

## 2016-07-06 LAB — MYOCARDIAL PERFUSION IMAGING
CSEPPHR: 117 {beats}/min
LHR: 0.33
LV dias vol: 103 mL (ref 62–150)
LV sys vol: 37 mL
Rest HR: 93 {beats}/min
SSS: 9

## 2016-07-18 ENCOUNTER — Encounter: Payer: Self-pay | Admitting: Family Medicine

## 2016-07-18 DIAGNOSIS — Z9989 Dependence on other enabling machines and devices: Principal | ICD-10-CM

## 2016-07-18 DIAGNOSIS — G4733 Obstructive sleep apnea (adult) (pediatric): Secondary | ICD-10-CM

## 2016-07-18 DIAGNOSIS — K7030 Alcoholic cirrhosis of liver without ascites: Secondary | ICD-10-CM

## 2016-07-19 MED ORDER — ESCITALOPRAM OXALATE 10 MG PO TABS: 10.0000 mg | ORAL_TABLET | Freq: Every day | ORAL | 3 refills | Status: DC

## 2016-07-24 NOTE — Addendum Note (Signed)
Addended by: Ria Bush on: 07/24/2016 01:27 PM   Modules accepted: Orders

## 2016-07-26 ENCOUNTER — Other Ambulatory Visit (INDEPENDENT_AMBULATORY_CARE_PROVIDER_SITE_OTHER): Payer: BLUE CROSS/BLUE SHIELD

## 2016-07-26 DIAGNOSIS — K7030 Alcoholic cirrhosis of liver without ascites: Secondary | ICD-10-CM | POA: Diagnosis not present

## 2016-07-26 DIAGNOSIS — R7989 Other specified abnormal findings of blood chemistry: Secondary | ICD-10-CM | POA: Diagnosis not present

## 2016-07-26 LAB — CBC WITH DIFFERENTIAL/PLATELET
BASOS PCT: 1.2 % (ref 0.0–3.0)
Basophils Absolute: 0.1 10*3/uL (ref 0.0–0.1)
EOS PCT: 2 % (ref 0.0–5.0)
Eosinophils Absolute: 0.1 10*3/uL (ref 0.0–0.7)
HCT: 32.1 % — ABNORMAL LOW (ref 39.0–52.0)
HEMOGLOBIN: 10.6 g/dL — AB (ref 13.0–17.0)
LYMPHS PCT: 23.5 % (ref 12.0–46.0)
Lymphs Abs: 1.3 10*3/uL (ref 0.7–4.0)
MCHC: 32.9 g/dL (ref 30.0–36.0)
MCV: 81.8 fl (ref 78.0–100.0)
MONO ABS: 0.4 10*3/uL (ref 0.1–1.0)
Monocytes Relative: 7.7 % (ref 3.0–12.0)
NEUTROS ABS: 3.6 10*3/uL (ref 1.4–7.7)
Neutrophils Relative %: 65.6 % (ref 43.0–77.0)
PLATELETS: 122 10*3/uL — AB (ref 150.0–400.0)
RBC: 3.92 Mil/uL — ABNORMAL LOW (ref 4.22–5.81)
RDW: 17.8 % — AB (ref 11.5–15.5)
WBC: 5.4 10*3/uL (ref 4.0–10.5)

## 2016-07-26 LAB — COMPREHENSIVE METABOLIC PANEL
ALBUMIN: 4.1 g/dL (ref 3.5–5.2)
ALT: 11 U/L (ref 0–53)
AST: 14 U/L (ref 0–37)
Alkaline Phosphatase: 71 U/L (ref 39–117)
BUN: 24 mg/dL — ABNORMAL HIGH (ref 6–23)
CALCIUM: 9.1 mg/dL (ref 8.4–10.5)
CHLORIDE: 106 meq/L (ref 96–112)
CO2: 28 meq/L (ref 19–32)
Creatinine, Ser: 1.67 mg/dL — ABNORMAL HIGH (ref 0.40–1.50)
GFR: 46.54 mL/min — AB (ref >60.00)
Glucose, Bld: 111 mg/dL — ABNORMAL HIGH (ref 70–99)
POTASSIUM: 4.5 meq/L (ref 3.5–5.1)
Sodium: 140 mEq/L (ref 135–145)
Total Bilirubin: 0.5 mg/dL (ref 0.2–1.2)
Total Protein: 7 g/dL (ref 6.0–8.3)

## 2016-07-26 NOTE — Addendum Note (Signed)
Addended by: Ellamae Sia on: 07/26/2016 09:43 AM   Modules accepted: Orders

## 2016-07-29 ENCOUNTER — Encounter: Payer: Self-pay | Admitting: Family Medicine

## 2016-07-29 ENCOUNTER — Ambulatory Visit (INDEPENDENT_AMBULATORY_CARE_PROVIDER_SITE_OTHER): Payer: BLUE CROSS/BLUE SHIELD | Admitting: Family Medicine

## 2016-07-29 VITALS — BP 126/78 | HR 94 | Temp 98.3°F | Resp 16 | Ht 66.0 in | Wt 286.0 lb

## 2016-07-29 DIAGNOSIS — N183 Chronic kidney disease, stage 3 unspecified: Secondary | ICD-10-CM

## 2016-07-29 DIAGNOSIS — G4733 Obstructive sleep apnea (adult) (pediatric): Secondary | ICD-10-CM

## 2016-07-29 DIAGNOSIS — I10 Essential (primary) hypertension: Secondary | ICD-10-CM

## 2016-07-29 DIAGNOSIS — Z6841 Body Mass Index (BMI) 40.0 and over, adult: Secondary | ICD-10-CM

## 2016-07-29 DIAGNOSIS — F321 Major depressive disorder, single episode, moderate: Secondary | ICD-10-CM

## 2016-07-29 DIAGNOSIS — D61818 Other pancytopenia: Secondary | ICD-10-CM

## 2016-07-29 DIAGNOSIS — Z9989 Dependence on other enabling machines and devices: Secondary | ICD-10-CM

## 2016-07-29 NOTE — Assessment & Plan Note (Signed)
Reviewed labs - latest Cr stable to slightly improved at 1.67

## 2016-07-29 NOTE — Assessment & Plan Note (Signed)
Improving with addition of lexapro 10mg  daily - continue.

## 2016-07-29 NOTE — Assessment & Plan Note (Signed)
Severe leukopenia that has improved after hospitalization and neupogen. Leukopenia attributed to immunosuppressants. Stable anemia, thrombocytopenia.

## 2016-07-29 NOTE — Assessment & Plan Note (Addendum)
No longer able to tolerate CPAP machine.  Upcoming appt with pulm.

## 2016-07-29 NOTE — Progress Notes (Signed)
Pre-visit discussion using our clinic review tool. No additional management support is needed unless otherwise documented below in the visit note.  

## 2016-07-29 NOTE — Assessment & Plan Note (Signed)
Congratulated on healthy diet and lifestyle changes, even though these changes haven't necessarily translated to weight loss. Encouraged sustaining healthy changes to date.

## 2016-07-29 NOTE — Assessment & Plan Note (Signed)
Losartan was decreased to 50mg  daily due to dizziness with BID dosing. Pt endorses some home readings that have been elevated, however this may vary based on cuff used. Advised he compare home automatic cuff to manual one niece has - and buy new cuff if discordance. If accurate and BP remaining elevated, to update me to add another medication to BP regimen.

## 2016-07-29 NOTE — Progress Notes (Signed)
BP 126/78   Pulse 94   Temp 98.3 F (36.8 C) (Oral)   Resp 16   Ht 5\' 6"  (1.676 m)   Wt 286 lb (129.7 kg)   SpO2 97%   BMI 46.16 kg/m    CC: 2 mo f/u visit Subjective:    Patient ID: Colton Porter, male    DOB: 01-07-1966, 51 y.o.   MRN: [id number]  HPI: Colton Porter is a 51 y.o. male presenting on 07/29/2016 for Follow-up (kidney function, depression,lab work)   See recent phone note and records for details. Recent hospitalization at [facility] 12/28-30/2018 for leukopenia thought related to immunosuppressants. Given neupogen. Losartan 100mg  was causing dizziness, this was decreased to 50mg  daily. Home BP running 165/100. Fluctuations in blood pressures noted based on what cuff is used. Wonders if home cuff readings are inaccurately high.   Started walking daily 2 wks ago - eating better, cutting out salt.   Ongoing mood trouble - lexapro started earlier this month. Improvement already noted - more motivated to do daily walks.   OSA - has been unable to tolerate CPAP.   Relevant past medical, surgical, family and social history reviewed and updated as indicated. Interim medical history since our last visit reviewed. Allergies and medications reviewed and updated. Current Outpatient Prescriptions on File Prior to Visit  Medication Sig  . amLODipine (NORVASC) 10 MG tablet Take 10 mg by mouth daily.  Marland Kitchen aspirin 81 MG chewable tablet Chew 81 mg by mouth daily.  . cetirizine (ZYRTEC) 10 MG tablet Take 10 mg by mouth.  . doxazosin (CARDURA) 8 MG tablet Take 8 mg by mouth at bedtime.   Marland Kitchen escitalopram (LEXAPRO) 10 MG tablet Take 1 tablet (10 mg total) by mouth daily.  . furosemide (LASIX) 40 MG tablet Take 1 tablet (40 mg total) by mouth 2 (two) times daily.  . hydrocortisone (ANUSOL-HC) 25 MG suppository Place 1 suppository (25 mg total) rectally 2 (two) times daily as needed for hemorrhoids or itching.  . losartan (COZAAR) 50 MG tablet Take 50 mg by mouth daily.   . magnesium oxide  (MAG-OX) 400 (241.3 Mg) MG tablet Take 1 tablet (400 mg total) by mouth 2 (two) times daily.  . Melatonin 3 MG TABS Take 1 tablet by mouth at bedtime.  . Multiple Vitamin (MULTIVITAMIN WITH MINERALS) TABS tablet Take 2 tablets by mouth every evening.  . mycophenolate (MYFORTIC) 360 MG TBEC EC tablet Take 720 mg by mouth 2 (two) times daily.  Marland Kitchen omeprazole (PRILOSEC) 20 MG capsule Take 20 mg by mouth 2 (two) times daily.  . predniSONE (DELTASONE) 5 MG tablet Take 10 mg by mouth daily with breakfast.  . sulfamethoxazole-trimethoprim (BACTRIM,SEPTRA) 400-80 MG tablet Take 1 tablet by mouth 3 (three) times a week. M,W,F  . tacrolimus (PROGRAF) 1 MG capsule Take 1 mg by mouth as directed. 3 capsules every AM and 2 capsules every PM  . tamsulosin (FLOMAX) 0.4 MG CAPS capsule Take 0.4 mg by mouth daily.  . valGANciclovir (VALCYTE) 450 MG tablet Take 450 mg by mouth daily.  Marland Kitchen acetaminophen (TYLENOL) 500 MG tablet Take 500 mg by mouth every 6 (six) hours as needed for mild pain.   No current facility-administered medications on file prior to visit.     Review of Systems Per HPI unless specifically indicated in ROS section     Objective:    BP 126/78   Pulse 94   Temp 98.3 F (36.8 C) (Oral)   Resp 16  Ht 5\' 6"  (1.676 m)   Wt 286 lb (129.7 kg)   SpO2 97%   BMI 46.16 kg/m   Wt Readings from Last 3 Encounters:  07/29/16 286 lb (129.7 kg)  06/22/16 287 lb (130.2 kg)  06/14/16 285 lb (129.3 kg)    Physical Exam  Constitutional: He appears well-developed and well-nourished. No distress.  Cardiovascular: Normal rate, regular rhythm, normal heart sounds and intact distal pulses.   No murmur heard. Pulmonary/Chest: Effort normal and breath sounds normal. No respiratory distress. He has no wheezes. He has no rales.  Musculoskeletal: He exhibits edema (nonpitting).  Psychiatric: He has a normal mood and affect.  Brighter affect  Nursing note and vitals reviewed.  Results for orders placed  or performed in visit on 07/26/16  CBC with Differential/Platelet  Result Value Ref Range   WBC 5.4 4.0 - 10.5 K/uL   RBC 3.92 (L) 4.22 - 5.81 Mil/uL   Hemoglobin 10.6 (L) 13.0 - 17.0 g/dL   HCT 32.1 (L) 39.0 - 52.0 %   MCV 81.8 78.0 - 100.0 fl   MCHC 32.9 30.0 - 36.0 g/dL   RDW 17.8 (H) 11.5 - 15.5 %   Platelets 122.0 (L) 150.0 - 400.0 K/uL   Neutrophils Relative % 65.6 43.0 - 77.0 %   Lymphocytes Relative 23.5 12.0 - 46.0 %   Monocytes Relative 7.7 3.0 - 12.0 %   Eosinophils Relative 2.0 0.0 - 5.0 %   Basophils Relative 1.2 0.0 - 3.0 %   Neutro Abs 3.6 1.4 - 7.7 K/uL   Lymphs Abs 1.3 0.7 - 4.0 K/uL   Monocytes Absolute 0.4 0.1 - 1.0 K/uL   Eosinophils Absolute 0.1 0.0 - 0.7 K/uL   Basophils Absolute 0.1 0.0 - 0.1 K/uL  Comprehensive metabolic panel  Result Value Ref Range   Sodium 140 135 - 145 mEq/L   Potassium 4.5 3.5 - 5.1 mEq/L   Chloride 106 96 - 112 mEq/L   CO2 28 19 - 32 mEq/L   Glucose, Bld 111 (H) 70 - 99 mg/dL   BUN 24 (H) 6 - 23 mg/dL   Creatinine, Ser 1.67 (H) 0.40 - 1.50 mg/dL   Total Bilirubin 0.5 0.2 - 1.2 mg/dL   Alkaline Phosphatase 71 39 - 117 U/L   AST 14 0 - 37 U/L   ALT 11 0 - 53 U/L   Total Protein 7.0 6.0 - 8.3 g/dL   Albumin 4.1 3.5 - 5.2 g/dL   Calcium 9.1 8.4 - 10.5 mg/dL   GFR 46.54 (L) >60.00 mL/min      Assessment & Plan:   Problem List Items Addressed This Visit    CKD (chronic kidney disease) stage 3, GFR 30-59 ml/min    Reviewed labs - latest Cr stable to slightly improved at 1.67      Depression, major, single episode, moderate (HCC) - Primary    Improving with addition of lexapro 10mg  daily - continue.      Essential hypertension    Losartan was decreased to 50mg  daily due to dizziness with BID dosing. Pt endorses some home readings that have been elevated, however this may vary based on cuff used. Advised he compare home automatic cuff to manual one niece has - and buy new cuff if discordance. If accurate and BP remaining  elevated, to update me to add another medication to BP regimen.       Morbid obesity with BMI of 40.0-44.9, adult ([place])    Congratulated on healthy diet  and lifestyle changes, even though these changes haven't necessarily translated to weight loss. Encouraged sustaining healthy changes to date.       OSA on CPAP    No longer able to tolerate CPAP machine.  Upcoming appt with pulm.       Pancytopenia, acquired ([place])    Severe leukopenia that has improved after hospitalization and neupogen. Leukopenia attributed to immunosuppressants. Stable anemia, thrombocytopenia.           Follow up plan: Return in about 2 months (around 09/26/2016) for annual exam, prior fasting for blood work.  Ria Bush, MD

## 2016-07-29 NOTE — Patient Instructions (Addendum)
Good to see you today. I think you are doing well. Continue lexapro 10mg  daily.  Check your machine against niece's blood pressure cuff. Let us know if accurate and staying elevated. Return in 61months for physical

## 2016-08-19 ENCOUNTER — Encounter: Payer: Self-pay | Admitting: Pulmonary Disease

## 2016-08-19 ENCOUNTER — Ambulatory Visit (INDEPENDENT_AMBULATORY_CARE_PROVIDER_SITE_OTHER): Payer: BLUE CROSS/BLUE SHIELD | Admitting: Pulmonary Disease

## 2016-08-19 VITALS — BP 152/94 | HR 80 | Ht 66.0 in | Wt 293.0 lb

## 2016-08-19 DIAGNOSIS — Z9989 Dependence on other enabling machines and devices: Secondary | ICD-10-CM | POA: Diagnosis not present

## 2016-08-19 DIAGNOSIS — G4733 Obstructive sleep apnea (adult) (pediatric): Secondary | ICD-10-CM | POA: Diagnosis not present

## 2016-08-19 NOTE — Progress Notes (Signed)
Subjective:    Patient ID: Colton Porter, male    DOB: 18-Sep-1965, 51 y.o.   MRN: [id number]  HPI  Chief Complaint  Patient presents with  . Advice Only    former [place] pt being treated for OSA- c/o difficulty wearing cpap at night.  DME: [facility].     [name] says he hasn't worn his sleep machine in 3 months because the pressure is too high right now and it takes his breath away.  He says that he is not sure if his grandchildren have messed with the machine.  He hasn't been to see Clark yet to see if it is working.  Since his liver transplant he has gained all of the weight back that he lost prior.  He feels really fatigued during the day.  He feels really terrible during the daytime.  He is trying ot exercise more but he has been napping during the daytime.  No sleepy while driving.  He has has some forgetfulness in the afternoon.  He has been sleeping on the couch lately. He says he only has 1 caffeinated beverage per day, it is not in the evenings. He does not have a television on or lights on in the room where he sleeps. There are no animals or children disturbing while sleeping.  Epworth Sleepiness Scale today is 11.   Past Medical History:  Diagnosis Date  . Alcohol dependence ([place]) 07/11/2012  . Alcoholic cirrhosis of liver with ascites ([place]) 07/2014   s./p transplant 12/2015  . Allergy   . Anemia   . Cellulitis of left leg   . Chronic diastolic heart failure ([place]) 11/01/2013  . CKD (chronic kidney disease) stage 3, GFR 30-59 ml/min 11/04/2015  . GERD (gastroesophageal reflux disease)   . Hyperlipidemia   . Hypertension   . Neuropathy ([place])   . OSA on CPAP 07/11/2012   HST 07/2013:  AHI 39/hr.    . Thrombocytopenia ([place]) 12/15/2011     Family History  Problem Relation Age of Onset  . Stroke Mother   . Emphysema Mother   . Hypertension Father   . Heart disease Father   . Emphysema Father   . Lung cancer Maternal Aunt   . Stroke Paternal Grandmother   . Heart  attack Neg Hx      Social History   Social History  . Marital status: Married    Spouse name: N/A  . Number of children: 2  . Years of education: N/A   Occupational History  . [employer]     no work lately   Social History Main Topics  . Smoking status: Never Smoker  . Smokeless tobacco: Former Systems developer    Types: Chew  . Alcohol use No     Comment: 4-6 drinks daily - NO ETOH SINCE APRIL 2016  . Drug use: Yes     Comment: remote use of marijuana in the past, has since quit  . Sexual activity: No   Other Topics Concern  . Not on file   Social History Narrative    Lives with wife   Occupation: full disability after cirrhosis dx, some working    Activity: some yardwork   Diet: good water, good fruit intake, lots of red meats     Allergies  Allergen Reactions  . Nsaids Other (See Comments)    cirrhosis  . Ambien [Zolpidem Tartrate] Other (See Comments)    Over-toxicity from liver failure  . Morphine And Related  Hallucinations.  . Hydrochlorothiazide W-Triamterene Other (See Comments)    REACTION: dizzy, nausea  . Lisinopril Other (See Comments)    REACTION: cough, decreased libido     Outpatient Medications Prior to Visit  Medication Sig Dispense Refill  . acetaminophen (TYLENOL) 500 MG tablet Take 500 mg by mouth every 6 (six) hours as needed for mild pain.    Marland Kitchen amLODipine (NORVASC) 10 MG tablet Take 10 mg by mouth daily.    Marland Kitchen aspirin 81 MG chewable tablet Chew 81 mg by mouth daily.    . cetirizine (ZYRTEC) 10 MG tablet Take 10 mg by mouth daily.     Marland Kitchen doxazosin (CARDURA) 8 MG tablet Take 8 mg by mouth at bedtime.   6  . escitalopram (LEXAPRO) 10 MG tablet Take 1 tablet (10 mg total) by mouth daily. 30 tablet 3  . hydrocortisone (ANUSOL-HC) 25 MG suppository Place 1 suppository (25 mg total) rectally 2 (two) times daily as needed for hemorrhoids or itching. 12 suppository 1  . losartan (COZAAR) 50 MG tablet Take 50 mg by mouth daily.   0  . magnesium  oxide (MAG-OX) 400 (241.3 Mg) MG tablet Take 1 tablet (400 mg total) by mouth 2 (two) times daily.    . Melatonin 3 MG TABS Take 1 tablet by mouth at bedtime.    . Multiple Vitamin (MULTIVITAMIN WITH MINERALS) TABS tablet Take 2 tablets by mouth every evening.    . mycophenolate (MYFORTIC) 360 MG TBEC EC tablet Take 720 mg by mouth 2 (two) times daily.    Marland Kitchen omeprazole (PRILOSEC) 20 MG capsule Take 20 mg by mouth 2 (two) times daily.  11  . predniSONE (DELTASONE) 5 MG tablet Take 10 mg by mouth daily with breakfast.    . sulfamethoxazole-trimethoprim (BACTRIM,SEPTRA) 400-80 MG tablet Take 1 tablet by mouth 3 (three) times a week. M,W,F    . tacrolimus (PROGRAF) 1 MG capsule Take 1 mg by mouth as directed. 2 capsules every AM and 1 capsule every PM    . tamsulosin (FLOMAX) 0.4 MG CAPS capsule Take 0.4 mg by mouth daily.    . furosemide (LASIX) 40 MG tablet Take 1 tablet (40 mg total) by mouth 2 (two) times daily. (Patient not taking: Reported on 08/19/2016) 180 tablet 3  . valGANciclovir (VALCYTE) 450 MG tablet Take 450 mg by mouth daily.     No facility-administered medications prior to visit.      Review of Systems  Constitutional: Negative for fever and unexpected weight change.  HENT: Negative for congestion, dental problem, ear pain, nosebleeds, postnasal drip, rhinorrhea, sinus pressure, sneezing, sore throat and trouble swallowing.   Eyes: Negative for redness and itching.  Respiratory: Positive for choking and shortness of breath. Negative for cough, chest tightness and wheezing.   Cardiovascular: Negative for palpitations and leg swelling.  Gastrointestinal: Negative for nausea and vomiting.  Genitourinary: Negative for dysuria.  Musculoskeletal: Negative for joint swelling.  Skin: Negative for rash.  Neurological: Negative for headaches.  Hematological: Does not bruise/bleed easily.  Psychiatric/Behavioral: Negative for dysphoric mood. The patient is not nervous/anxious.          Objective:   Physical Exam  Vitals:   08/19/16 0924  BP: (!) 152/94  Pulse: 80  SpO2: 99%  Weight: 293 lb (132.9 kg)  Height: 5\' 6"  (1.676 m)   Gen: obese, no acute distress HENT: NCAT, OP clear, neck supple without masses Eyes: PERRL, EOMi Lymph: no cervical lymphadenopathy PULM: CTA B CV: RRR, no  mgr, no JVD GI: BS+, soft, nontender, no hsm Derm: no rash or skin breakdown MSK: normal bulk and tone Neuro: A&Ox4, CN II-XII intact, strength 5/5 in all 4 extremities Psyche: normal mood and affect  Records from his visit in 2015 with a sleep physician reviewed where CPAP was started with an auto titrating device.  2015 sleep study showed an AHI of 39 events per hour with O2 sats ration is low 78%     Assessment & Plan:  OSA on CPAP He notes worsening symptoms of sleep apnea lately with increasing fatigue and daytime somnolence. He says his sleep quality has been terrible. He has not been using CPAP because he says the pressure is feeling too high from the machine.  Clearly, his 30-40 pound weight gain in the last several months is making his obstructive sleep apnea worse.  He is very reluctant to go for a CPAP titration study despite my recommendation.  Plan: No driving while sleepy Educated on the importance of weight loss He is going to have his machine inspected by the company that supplies it to make sure it's working okay As can start using CPAP during the daytime for desensitization and then he is going to use it for the next couple weeks If no improvement in symptoms after these measures then he will call me so I can arrange for a CPAP titration study Follow-up 3 months Sleep hygiene reviewed today    Current Outpatient Prescriptions:  .  acetaminophen (TYLENOL) 500 MG tablet, Take 500 mg by mouth every 6 (six) hours as needed for mild pain., Disp: , Rfl:  .  amLODipine (NORVASC) 10 MG tablet, Take 10 mg by mouth daily., Disp: , Rfl:  .  aspirin 81 MG chewable  tablet, Chew 81 mg by mouth daily., Disp: , Rfl:  .  cetirizine (ZYRTEC) 10 MG tablet, Take 10 mg by mouth daily. , Disp: , Rfl:  .  doxazosin (CARDURA) 8 MG tablet, Take 8 mg by mouth at bedtime. , Disp: , Rfl: 6 .  escitalopram (LEXAPRO) 10 MG tablet, Take 1 tablet (10 mg total) by mouth daily., Disp: 30 tablet, Rfl: 3 .  hydrocortisone (ANUSOL-HC) 25 MG suppository, Place 1 suppository (25 mg total) rectally 2 (two) times daily as needed for hemorrhoids or itching., Disp: 12 suppository, Rfl: 1 .  losartan (COZAAR) 50 MG tablet, Take 50 mg by mouth daily. , Disp: , Rfl: 0 .  magnesium oxide (MAG-OX) 400 (241.3 Mg) MG tablet, Take 1 tablet (400 mg total) by mouth 2 (two) times daily., Disp: , Rfl:  .  Melatonin 3 MG TABS, Take 1 tablet by mouth at bedtime., Disp: , Rfl:  .  Multiple Vitamin (MULTIVITAMIN WITH MINERALS) TABS tablet, Take 2 tablets by mouth every evening., Disp: , Rfl:  .  mycophenolate (MYFORTIC) 360 MG TBEC EC tablet, Take 720 mg by mouth 2 (two) times daily., Disp: , Rfl:  .  omeprazole (PRILOSEC) 20 MG capsule, Take 20 mg by mouth 2 (two) times daily., Disp: , Rfl: 11 .  predniSONE (DELTASONE) 5 MG tablet, Take 10 mg by mouth daily with breakfast., Disp: , Rfl:  .  sulfamethoxazole-trimethoprim (BACTRIM,SEPTRA) 400-80 MG tablet, Take 1 tablet by mouth 3 (three) times a week. M,W,F, Disp: , Rfl:  .  tacrolimus (PROGRAF) 1 MG capsule, Take 1 mg by mouth as directed. 2 capsules every AM and 1 capsule every PM, Disp: , Rfl:  .  tamsulosin (FLOMAX) 0.4 MG CAPS capsule, Take  0.4 mg by mouth daily., Disp: , Rfl:

## 2016-08-19 NOTE — Assessment & Plan Note (Signed)
He notes worsening symptoms of sleep apnea lately with increasing fatigue and daytime somnolence. He says his sleep quality has been terrible. He has not been using CPAP because he says the pressure is feeling too high from the machine.  Clearly, his 30-40 pound weight gain in the last several months is making his obstructive sleep apnea worse.  He is very reluctant to go for a CPAP titration study despite my recommendation.  Plan: No driving while sleepy Educated on the importance of weight loss He is going to have his machine inspected by the company that supplies it to make sure it's working okay As can start using CPAP during the daytime for desensitization and then he is going to use it for the next couple weeks If no improvement in symptoms after these measures then he will call me so I can arrange for a CPAP titration study Follow-up 3 months Sleep hygiene reviewed today

## 2016-08-19 NOTE — Patient Instructions (Signed)
Contact [facility] to go over your machine and make sure it is working okay Use CPAP during the daytime while awake to get used to how it feels Try sleeping with it for the next couple weeks and let me know how that goes If you're still having more trouble with fatigue during the daytime and I will arrange for a CPAP titration study Lose weight We'll see you back in 3-4 months or sooner if needed

## 2016-09-12 ENCOUNTER — Encounter: Payer: Self-pay | Admitting: Family Medicine

## 2016-09-12 NOTE — Telephone Encounter (Signed)
plz call to schedule appt for Tues.

## 2016-09-13 ENCOUNTER — Encounter: Payer: Self-pay | Admitting: Internal Medicine

## 2016-09-13 ENCOUNTER — Ambulatory Visit (INDEPENDENT_AMBULATORY_CARE_PROVIDER_SITE_OTHER): Payer: BLUE CROSS/BLUE SHIELD | Admitting: Internal Medicine

## 2016-09-13 VITALS — BP 124/64 | HR 82 | Temp 98.0°F | Wt 296.0 lb

## 2016-09-13 DIAGNOSIS — J302 Other seasonal allergic rhinitis: Secondary | ICD-10-CM | POA: Diagnosis not present

## 2016-09-13 MED ORDER — HYDROCODONE-HOMATROPINE 5-1.5 MG/5ML PO SYRP: 5.0000 mL | ORAL_SOLUTION | Freq: Three times a day (TID) | ORAL | 0 refills | Status: DC | PRN

## 2016-09-13 MED ORDER — FLUTICASONE PROPIONATE 50 MCG/ACT NA SUSP: 2.0000 | Freq: Every day | NASAL | 6 refills | Status: DC

## 2016-09-13 NOTE — Progress Notes (Signed)
HPI  Pt presents to the clinic today with c/o headache, runny nose, ear fullness and cough. This started 2-3 days ago. He is blowing clear mucous out of his nose. He denies ear pain or decreased hearing. The cough is productive of clear mucous. He denies fever, chills or body aches. He has tried Tylenol with some relief. He does have a history of allergies, and is a transplant pt. He has had sick contacts diagnosed with URI. His immunizations are UTD.  Review of Systems        Past Medical History:  Diagnosis Date  . Alcohol dependence ([place]) 07/11/2012  . Alcoholic cirrhosis of liver with ascites ([place]) 07/2014   s./p transplant 12/2015  . Allergy   . Anemia   . Cellulitis of left leg   . Chronic diastolic heart failure ([place]) 11/01/2013  . CKD (chronic kidney disease) stage 3, GFR 30-59 ml/min 11/04/2015  . GERD (gastroesophageal reflux disease)   . Hyperlipidemia   . Hypertension   . Neuropathy ([place])   . OSA on CPAP 07/11/2012   HST 07/2013:  AHI 39/hr.    . Thrombocytopenia ([place]) 12/15/2011    Family History  Problem Relation Age of Onset  . Stroke Mother   . Emphysema Mother   . Hypertension Father   . Heart disease Father   . Emphysema Father   . Lung cancer Maternal Aunt   . Stroke Paternal Grandmother   . Heart attack Neg Hx     Social History   Social History  . Marital status: Married    Spouse name: N/A  . Number of children: 2  . Years of education: N/A   Occupational History  . [employer]     no work lately   Social History Main Topics  . Smoking status: Never Smoker  . Smokeless tobacco: Former Systems developer    Types: Chew  . Alcohol use No     Comment: 4-6 drinks daily - NO ETOH SINCE APRIL 2016  . Drug use: Yes     Comment: remote use of marijuana in the past, has since quit  . Sexual activity: No   Other Topics Concern  . Not on file   Social History Narrative    Lives with wife   Occupation: full disability after cirrhosis dx, some working     Activity: some yardwork   Diet: good water, good fruit intake, lots of red meats    Allergies  Allergen Reactions  . Nsaids Other (See Comments)    cirrhosis  . Ambien [Zolpidem Tartrate] Other (See Comments)    Over-toxicity from liver failure  . Morphine And Related     Hallucinations.  . Hydrochlorothiazide W-Triamterene Other (See Comments)    REACTION: dizzy, nausea  . Lisinopril Other (See Comments)    REACTION: cough, decreased libido     Constitutional: Positive headache. Denies fatigue, fever or abrupt weight changes.  HEENT:  Positive ear fullness, runny nose. Denies eye redness, eye pain, pressure behind the eyes, facial pain, nasal congestion, ear pain, ringing in the ears, wax buildup, or sore throat. Respiratory: Positive cough. Denies difficulty breathing or shortness of breath.  Cardiovascular: Denies chest pain, chest tightness, palpitations or swelling in the hands or feet.   No other specific complaints in a complete review of systems (except as listed in HPI above).  Objective:  BP 124/64   Pulse 82   Temp 98 F (36.7 C) (Oral)   Wt 296 lb (134.3 kg)  SpO2 97%   BMI 47.78 kg/m   Wt Readings from Last 3 Encounters:  08/19/16 293 lb (132.9 kg)  07/29/16 286 lb (129.7 kg)  06/22/16 287 lb (130.2 kg)     General: Appears his stated age, in NAD. HEENT: Head: normal shape and size, no sinus tenderness noted; Ears: Tm's gray and intact, normal light reflex; Nose: mucosa pink and moist, septum midline; Throat/Mouth: + PND. Teeth present, mucosa pink and moist, no exudate noted, no lesions or ulcerations noted.  Neck: No cervical lymphadenopathy.  Cardiovascular: Normal rate and rhythm. S1,S2 noted.  No murmur, rubs or gallops noted.  Pulmonary/Chest: Normal effort and positive vesicular breath sounds. No respiratory distress. No wheezes, rales or ronchi noted.       Assessment & Plan:   Allergic Rhinitis:  Get some rest and drink plenty of  water Start Zyrtec OTC eRx for Flonase sent to pharmacy Rx for Hycodan cough syrup Return precautions discussed  RTC as needed or if symptoms persist.   [name], NP

## 2016-09-13 NOTE — Telephone Encounter (Signed)
Appt scheduled for this AM with RB.

## 2016-09-13 NOTE — Patient Instructions (Signed)
Allergic Rhinitis Allergic rhinitis is when the mucous membranes in the nose respond to allergens. Allergens are particles in the air that cause your body to have an allergic reaction. This causes you to release allergic antibodies. Through a chain of events, these eventually cause you to release histamine into the blood stream. Although meant to protect the body, it is this release of histamine that causes your discomfort, such as frequent sneezing, congestion, and an itchy, runny nose. What are the causes? Seasonal allergic rhinitis (hay fever) is caused by pollen allergens that may come from grasses, trees, and weeds. Year-round allergic rhinitis (perennial allergic rhinitis) is caused by allergens such as house dust mites, pet dander, and mold spores. What are the signs or symptoms?  Nasal stuffiness (congestion).  Itchy, runny nose with sneezing and tearing of the eyes. How is this diagnosed? Your health care provider can help you determine the allergen or allergens that trigger your symptoms. If you and your health care provider are unable to determine the allergen, skin or blood testing may be used. Your health care provider will diagnose your condition after taking your health history and performing a physical exam. Your health care provider may assess you for other related conditions, such as asthma, pink eye, or an ear infection. How is this treated? Allergic rhinitis does not have a cure, but it can be controlled by:  Medicines that block allergy symptoms. These may include allergy shots, nasal sprays, and oral antihistamines.  Avoiding the allergen. Hay fever may often be treated with antihistamines in pill or nasal spray forms. Antihistamines block the effects of histamine. There are over-the-counter medicines that may help with nasal congestion and swelling around the eyes. Check with your health care provider before taking or giving this medicine. If avoiding the allergen or the  medicine prescribed do not work, there are many new medicines your health care provider can prescribe. Stronger medicine may be used if initial measures are ineffective. Desensitizing injections can be used if medicine and avoidance does not work. Desensitization is when a patient is given ongoing shots until the body becomes less sensitive to the allergen. Make sure you follow up with your health care provider if problems continue. Follow these instructions at home: It is not possible to completely avoid allergens, but you can reduce your symptoms by taking steps to limit your exposure to them. It helps to know exactly what you are allergic to so that you can avoid your specific triggers. Contact a health care provider if:  You have a fever.  You develop a cough that does not stop easily (persistent).  You have shortness of breath.  You start wheezing.  Symptoms interfere with normal daily activities. This information is not intended to replace advice given to you by your health care provider. Make sure you discuss any questions you have with your health care provider. Document Released: 03/08/2001 Document Revised: 02/12/2016 Document Reviewed: 02/18/2013 Elsevier Interactive Patient Education  2017 [employer] Inc.  

## 2016-10-02 ENCOUNTER — Other Ambulatory Visit: Payer: Self-pay | Admitting: Family Medicine

## 2016-10-02 DIAGNOSIS — K7030 Alcoholic cirrhosis of liver without ascites: Secondary | ICD-10-CM

## 2016-10-02 DIAGNOSIS — N183 Chronic kidney disease, stage 3 unspecified: Secondary | ICD-10-CM

## 2016-10-02 DIAGNOSIS — E559 Vitamin D deficiency, unspecified: Secondary | ICD-10-CM

## 2016-10-02 DIAGNOSIS — Z125 Encounter for screening for malignant neoplasm of prostate: Secondary | ICD-10-CM

## 2016-10-02 DIAGNOSIS — Z944 Liver transplant status: Secondary | ICD-10-CM

## 2016-10-02 DIAGNOSIS — R739 Hyperglycemia, unspecified: Secondary | ICD-10-CM

## 2016-10-02 DIAGNOSIS — E7849 Other hyperlipidemia: Secondary | ICD-10-CM

## 2016-10-02 DIAGNOSIS — D61818 Other pancytopenia: Secondary | ICD-10-CM

## 2016-10-03 ENCOUNTER — Other Ambulatory Visit (INDEPENDENT_AMBULATORY_CARE_PROVIDER_SITE_OTHER): Payer: BLUE CROSS/BLUE SHIELD

## 2016-10-03 DIAGNOSIS — R739 Hyperglycemia, unspecified: Secondary | ICD-10-CM

## 2016-10-03 DIAGNOSIS — D61818 Other pancytopenia: Secondary | ICD-10-CM

## 2016-10-03 DIAGNOSIS — Z125 Encounter for screening for malignant neoplasm of prostate: Secondary | ICD-10-CM | POA: Diagnosis not present

## 2016-10-03 DIAGNOSIS — E784 Other hyperlipidemia: Secondary | ICD-10-CM

## 2016-10-03 DIAGNOSIS — N183 Chronic kidney disease, stage 3 unspecified: Secondary | ICD-10-CM

## 2016-10-03 DIAGNOSIS — K7030 Alcoholic cirrhosis of liver without ascites: Secondary | ICD-10-CM | POA: Diagnosis not present

## 2016-10-03 DIAGNOSIS — E7849 Other hyperlipidemia: Secondary | ICD-10-CM

## 2016-10-03 DIAGNOSIS — E559 Vitamin D deficiency, unspecified: Secondary | ICD-10-CM | POA: Diagnosis not present

## 2016-10-03 LAB — TSH: TSH: 1.47 u[IU]/mL (ref 0.35–4.50)

## 2016-10-03 LAB — CBC WITH DIFFERENTIAL/PLATELET
BASOS ABS: 0 10*3/uL (ref 0.0–0.1)
Basophils Relative: 0.5 % (ref 0.0–3.0)
EOS ABS: 0.1 10*3/uL (ref 0.0–0.7)
Eosinophils Relative: 2.7 % (ref 0.0–5.0)
HCT: 32.9 % — ABNORMAL LOW (ref 39.0–52.0)
Hemoglobin: 11 g/dL — ABNORMAL LOW (ref 13.0–17.0)
LYMPHS PCT: 21.6 % (ref 12.0–46.0)
Lymphs Abs: 1.1 10*3/uL (ref 0.7–4.0)
MCHC: 33.5 g/dL (ref 30.0–36.0)
MCV: 83.7 fl (ref 78.0–100.0)
MONO ABS: 0.4 10*3/uL (ref 0.1–1.0)
Monocytes Relative: 9 % (ref 3.0–12.0)
NEUTROS PCT: 66.2 % (ref 43.0–77.0)
Neutro Abs: 3.3 10*3/uL (ref 1.4–7.7)
PLATELETS: 128 10*3/uL — AB (ref 150.0–400.0)
RBC: 3.94 Mil/uL — AB (ref 4.22–5.81)
RDW: 17.4 % — ABNORMAL HIGH (ref 11.5–15.5)
WBC: 5 10*3/uL (ref 4.0–10.5)

## 2016-10-03 LAB — LIPID PANEL
CHOLESTEROL: 113 mg/dL (ref 0–200)
HDL: 26.4 mg/dL — ABNORMAL LOW (ref >39.00)
NonHDL: 86.11
Total CHOL/HDL Ratio: 4
Triglycerides: 318 mg/dL — ABNORMAL HIGH (ref 0.0–149.0)
VLDL: 63.6 mg/dL — ABNORMAL HIGH (ref 0.0–40.0)

## 2016-10-03 LAB — COMPREHENSIVE METABOLIC PANEL
ALT: 12 U/L (ref 0–53)
AST: 11 U/L (ref 0–37)
Albumin: 3.8 g/dL (ref 3.5–5.2)
Alkaline Phosphatase: 69 U/L (ref 39–117)
BUN: 20 mg/dL (ref 6–23)
CO2: 28 meq/L (ref 19–32)
Calcium: 8.9 mg/dL (ref 8.4–10.5)
Chloride: 108 mEq/L (ref 96–112)
Creatinine, Ser: 1.37 mg/dL (ref 0.40–1.50)
GFR: 58.45 mL/min — AB (ref >60.00)
GLUCOSE: 134 mg/dL — AB (ref 70–99)
POTASSIUM: 4.8 meq/L (ref 3.5–5.1)
SODIUM: 141 meq/L (ref 135–145)
Total Bilirubin: 0.5 mg/dL (ref 0.2–1.2)
Total Protein: 6.4 g/dL (ref 6.0–8.3)

## 2016-10-03 LAB — VITAMIN D 25 HYDROXY (VIT D DEFICIENCY, FRACTURES): VITD: 25.54 ng/mL — ABNORMAL LOW (ref 30.00–100.00)

## 2016-10-03 LAB — MAGNESIUM: MAGNESIUM: 1.6 mg/dL (ref 1.5–2.5)

## 2016-10-03 LAB — LDL CHOLESTEROL, DIRECT: LDL DIRECT: 55 mg/dL

## 2016-10-03 LAB — PSA: PSA: 2.07 ng/mL (ref 0.10–4.00)

## 2016-10-03 LAB — HEMOGLOBIN A1C: Hgb A1c MFr Bld: 5.8 % (ref 4.6–6.5)

## 2016-10-05 ENCOUNTER — Ambulatory Visit (INDEPENDENT_AMBULATORY_CARE_PROVIDER_SITE_OTHER): Payer: BLUE CROSS/BLUE SHIELD | Admitting: Family Medicine

## 2016-10-05 ENCOUNTER — Encounter: Payer: Self-pay | Admitting: Family Medicine

## 2016-10-05 VITALS — BP 140/82 | HR 88 | Temp 97.8°F | Ht 67.0 in | Wt 299.5 lb

## 2016-10-05 DIAGNOSIS — G4733 Obstructive sleep apnea (adult) (pediatric): Secondary | ICD-10-CM

## 2016-10-05 DIAGNOSIS — F321 Major depressive disorder, single episode, moderate: Secondary | ICD-10-CM

## 2016-10-05 DIAGNOSIS — R351 Nocturia: Secondary | ICD-10-CM

## 2016-10-05 DIAGNOSIS — N183 Chronic kidney disease, stage 3 unspecified: Secondary | ICD-10-CM

## 2016-10-05 DIAGNOSIS — R600 Localized edema: Secondary | ICD-10-CM

## 2016-10-05 DIAGNOSIS — Z944 Liver transplant status: Secondary | ICD-10-CM

## 2016-10-05 DIAGNOSIS — D696 Thrombocytopenia, unspecified: Secondary | ICD-10-CM

## 2016-10-05 DIAGNOSIS — Z0000 Encounter for general adult medical examination without abnormal findings: Secondary | ICD-10-CM | POA: Diagnosis not present

## 2016-10-05 DIAGNOSIS — I5030 Unspecified diastolic (congestive) heart failure: Secondary | ICD-10-CM | POA: Diagnosis not present

## 2016-10-05 DIAGNOSIS — E785 Hyperlipidemia, unspecified: Secondary | ICD-10-CM | POA: Diagnosis not present

## 2016-10-05 DIAGNOSIS — R7303 Prediabetes: Secondary | ICD-10-CM

## 2016-10-05 DIAGNOSIS — N400 Enlarged prostate without lower urinary tract symptoms: Secondary | ICD-10-CM | POA: Insufficient documentation

## 2016-10-05 DIAGNOSIS — Z6841 Body Mass Index (BMI) 40.0 and over, adult: Secondary | ICD-10-CM

## 2016-10-05 DIAGNOSIS — N401 Enlarged prostate with lower urinary tract symptoms: Secondary | ICD-10-CM

## 2016-10-05 DIAGNOSIS — Z9989 Dependence on other enabling machines and devices: Secondary | ICD-10-CM

## 2016-10-05 NOTE — Patient Instructions (Addendum)
Schedule eye exam. Good to see you today.  Return as needed or in 6 months for follow up visit  Health Maintenance, Male A healthy lifestyle and preventive care is important for your health and wellness. Ask your health care provider about what schedule of regular examinations is right for you. What should I know about weight and diet?  Eat a Healthy Diet  Eat plenty of vegetables, fruits, whole grains, low-fat dairy products, and lean protein.  Do not eat a lot of foods high in solid fats, added sugars, or salt. Maintain a Healthy Weight  Regular exercise can help you achieve or maintain a healthy weight. You should:  Do at least 150 minutes of exercise each week. The exercise should increase your heart rate and make you sweat (moderate-intensity exercise).  Do strength-training exercises at least twice a week. Watch Your Levels of Cholesterol and Blood Lipids  Have your blood tested for lipids and cholesterol every 5 years starting at 50 years of age. If you are at high risk for heart disease, you should start having your blood tested when you are 50 years old. You may need to have your cholesterol levels checked more often if:  Your lipid or cholesterol levels are high.  You are older than 50 years of age.  You are at high risk for heart disease. What should I know about cancer screening? Many types of cancers can be detected early and may often be prevented. Lung Cancer  You should be screened every year for lung cancer if:  You are a current smoker who has smoked for at least 30 years.  You are a former smoker who has quit within the past 15 years.  Talk to your health care provider about your screening options, when you should start screening, and how often you should be screened. Colorectal Cancer  Routine colorectal cancer screening usually begins at 50 years of age and should be repeated every 5-10 years until you are 50 years old. You may need to be screened more  often if early forms of precancerous polyps or small growths are found. Your health care provider may recommend screening at an earlier age if you have risk factors for colon cancer.  Your health care provider may recommend using home test kits to check for hidden blood in the stool.  A small camera at the end of a tube can be used to examine your colon (sigmoidoscopy or colonoscopy). This checks for the earliest forms of colorectal cancer. Prostate and Testicular Cancer  Depending on your age and overall health, your health care provider may do certain tests to screen for prostate and testicular cancer.  Talk to your health care provider about any symptoms or concerns you have about testicular or prostate cancer. Skin Cancer  Check your skin from head to toe regularly.  Tell your health care provider about any new moles or changes in moles, especially if:  There is a change in a mole's size, shape, or color.  You have a mole that is larger than a pencil eraser.  Always use sunscreen. Apply sunscreen liberally and repeat throughout the day.  Protect yourself by wearing long sleeves, pants, a wide-brimmed hat, and sunglasses when outside. What should I know about heart disease, diabetes, and high blood pressure?  If you are 39-26 years of age, have your blood pressure checked every 3-5 years. If you are 65 years of age or older, have your blood pressure checked every year. You should have  your blood pressure measured twice-once when you are at a hospital or clinic, and once when you are not at a hospital or clinic. Record the average of the two measurements. To check your blood pressure when you are not at a hospital or clinic, you can use:  An automated blood pressure machine at a pharmacy.  A home blood pressure monitor.  Talk to your health care provider about your target blood pressure.  If you are between 10-74 years old, ask your health care provider if you should take aspirin  to prevent heart disease.  Have regular diabetes screenings by checking your fasting blood sugar level.  If you are at a normal weight and have a low risk for diabetes, have this test once every three years after the age of 61.  If you are overweight and have a high risk for diabetes, consider being tested at a younger age or more often.  A one-time screening for abdominal aortic aneurysm (AAA) by ultrasound is recommended for men aged 46-75 years who are current or former smokers. What should I know about preventing infection? Hepatitis B  If you have a higher risk for hepatitis B, you should be screened for this virus. Talk with your health care provider to find out if you are at risk for hepatitis B infection. Hepatitis C  Blood testing is recommended for:  Everyone born from [birth year] through 1964.  Anyone with known risk factors for hepatitis C. Sexually Transmitted Diseases (STDs)  You should be screened each year for STDs including gonorrhea and chlamydia if:  You are sexually active and are younger than 50 years of age.  You are older than 50 years of age and your health care provider tells you that you are at risk for this type of infection.  Your sexual activity has changed since you were last screened and you are at an increased risk for chlamydia or gonorrhea. Ask your health care provider if you are at risk.  Talk with your health care provider about whether you are at high risk of being infected with HIV. Your health care provider may recommend a prescription medicine to help prevent HIV infection. What else can I do?  Schedule regular health, dental, and eye exams.  Stay current with your vaccines (immunizations).  Do not use any tobacco products, such as cigarettes, chewing tobacco, and e-cigarettes. If you need help quitting, ask your health care provider.  Limit alcohol intake to no more than 2 drinks per day. One drink equals 12 ounces of beer, 5 ounces of wine, or  1 ounces of hard liquor.  Do not use street drugs.  Do not share needles.  Ask your health care provider for help if you need support or information about quitting drugs.  Tell your health care provider if you often feel depressed.  Tell your health care provider if you have ever been abused or do not feel safe at home. This information is not intended to replace advice given to you by your health care provider. Make sure you discuss any questions you have with your health care provider. Document Released: 12/10/2007 Document Revised: 02/10/2016 Document Reviewed: 03/17/2015 Elsevier Interactive Patient Education  2017 [employer].

## 2016-10-05 NOTE — Assessment & Plan Note (Signed)
Continues BID magnesium.

## 2016-10-05 NOTE — Progress Notes (Signed)
BP 140/82   Pulse 88   Temp 97.8 F (36.6 C) (Oral)   Ht 5\' 7"  (1.702 m)   Wt 299 lb 8 oz (135.9 kg)   BMI 46.91 kg/m    CC: CPE Subjective:    Patient ID: Colton Porter, male    DOB: March 24, 1966, 51 y.o.   MRN: [id number]  HPI: Colton Porter is a 51 y.o. 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 OSA - trouble tolerating CPAP, saw pulm who recommended daytime use for desensitization and [facility] eval for malfunctioning unit (hasn't done this yet). CKD followed by nephrology. Stress test 06/2016 was low risk for ischemia with normal EF 64%.   Just weaned off prednisone (last 10 days).  Appetite ok.  Continues walking during the day.  Weight gain has returned.  Sleeps on couch - but no orthopnea.   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 No changing moles on skin.  Non smoker. Quit chewing tobacco 10/10/2015. Alcohol - abstained since 10/10/2014.  Wants to quit sugar this 4/15.   Lives with wife Occupation: full disability after cirrhosis dx, some working  Activity: some yardwork Diet: good water, good fruit intake, lots of red meats  Relevant past medical, surgical, family and social history reviewed and updated as indicated. Interim medical history since our last visit reviewed. Allergies and medications reviewed and updated. Outpatient Medications Prior to Visit  Medication Sig Dispense Refill  . acetaminophen (TYLENOL) 500 MG tablet Take 500 mg by mouth every 6 (six) hours as needed for mild pain.    Marland Kitchen amLODipine (NORVASC) 10 MG tablet Take 10 mg by mouth  daily.    Marland Kitchen aspirin 81 MG chewable tablet Chew 81 mg by mouth daily.    . cetirizine (ZYRTEC) 10 MG tablet Take 10 mg by mouth daily.     Marland Kitchen doxazosin (CARDURA) 8 MG tablet Take 8 mg by mouth at bedtime.   6  . escitalopram (LEXAPRO) 10 MG tablet Take 1 tablet (10 mg total) by mouth daily. 30 tablet 3  . fluticasone (FLONASE) 50 MCG/ACT nasal spray Place 2 sprays into both nostrils daily. 16 g 6  . hydrocortisone (ANUSOL-HC) 25 MG suppository Place 1 suppository (25 mg total) rectally 2 (two) times daily as needed for hemorrhoids or itching. 12 suppository 1  . losartan (COZAAR) 50 MG tablet Take 50 mg by mouth daily.   0  . magnesium oxide (MAG-OX) 400 (241.3 Mg) MG tablet Take 1 tablet (400 mg total) by mouth 2 (two) times daily.    . Multiple Vitamin (MULTIVITAMIN WITH MINERALS) TABS tablet Take 2 tablets by mouth every evening.    Marland Kitchen omeprazole (PRILOSEC) 20 MG capsule Take 20 mg by mouth 2 (two) times daily.  11  . sulfamethoxazole-trimethoprim (BACTRIM,SEPTRA) 400-80 MG tablet Take 1 tablet by mouth 3 (three) times a week. M,W,F    . tacrolimus (PROGRAF) 1 MG capsule Take 2 mg by mouth 2 (two) times daily.     . tamsulosin (FLOMAX) 0.4 MG CAPS capsule Take 0.8 mg by mouth daily.     . Melatonin 3 MG TABS Take 1 tablet by mouth at bedtime.    Marland Kitchen  HYDROcodone-homatropine (HYCODAN) 5-1.5 MG/5ML syrup Take 5 mLs by mouth every 8 (eight) hours as needed for cough. 75 mL 0  . mycophenolate (MYFORTIC) 360 MG TBEC EC tablet Take 720 mg by mouth 2 (two) times daily.    . predniSONE (DELTASONE) 5 MG tablet Take 10 mg by mouth daily with breakfast.     No facility-administered medications prior to visit.      Per HPI unless specifically indicated in ROS section below Review of Systems  Constitutional: Negative for activity change, appetite change, chills, fatigue, fever and unexpected weight change.  HENT: Negative for hearing loss.   Eyes: Negative for visual disturbance.  Respiratory: Positive  for cough (mild) and shortness of breath (mild). Negative for chest tightness and wheezing.   Cardiovascular: Positive for leg swelling. Negative for chest pain and palpitations.  Gastrointestinal: Positive for abdominal distention. Negative for abdominal pain, blood in stool, constipation, diarrhea, nausea and vomiting.  Genitourinary: Negative for difficulty urinating and hematuria.  Musculoskeletal: Negative for arthralgias, myalgias and neck pain.  Skin: Negative for rash.  Neurological: Positive for dizziness (occasional). Negative for seizures, syncope and headaches.  Hematological: Negative for adenopathy. Bruises/bleeds easily.  Psychiatric/Behavioral: Negative for dysphoric mood. The patient is not nervous/anxious.       Objective:    BP 140/82   Pulse 88   Temp 97.8 F (36.6 C) (Oral)   Ht 5\' 7"  (1.702 m)   Wt 299 lb 8 oz (135.9 kg)   BMI 46.91 kg/m   Wt Readings from Last 3 Encounters:  10/05/16 299 lb 8 oz (135.9 kg)  09/13/16 296 lb (134.3 kg)  08/19/16 293 lb (132.9 kg)    Physical Exam  Constitutional: He is oriented to person, place, and time. He appears well-developed and well-nourished. No distress.  HENT:  Head: Normocephalic and atraumatic.  Right Ear: Hearing, tympanic membrane, external ear and ear canal normal.  Left Ear: Hearing, tympanic membrane, external ear and ear canal normal.  Nose: Nose normal.  Mouth/Throat: Uvula is midline, oropharynx is clear and moist and mucous membranes are normal. No oropharyngeal exudate, posterior oropharyngeal edema or posterior oropharyngeal erythema.  Eyes: Conjunctivae and EOM are normal. Pupils are equal, round, and reactive to light. No scleral icterus.  Neck: Normal range of motion. Neck supple. No thyromegaly present.  Cardiovascular: Normal rate, regular rhythm, normal heart sounds and intact distal pulses.   No murmur heard. Pulses:      Radial pulses are 2+ on the right side, and 2+ on the left side.    Pulmonary/Chest: Effort normal and breath sounds normal. No respiratory distress. He has no wheezes. He has no rales.  Abdominal: Soft. Normal appearance and bowel sounds are normal. He exhibits distension. He exhibits no ascites and no mass. There is no tenderness. There is no rebound and no guarding.  Musculoskeletal: Normal range of motion. He exhibits no edema.  Lymphadenopathy:    He has no cervical adenopathy.  Neurological: He is alert and oriented to person, place, and time.  CN grossly intact, station and gait intact  Skin: Skin is warm and dry. No rash noted.  Psychiatric: He has a normal mood and affect. His behavior is normal. Judgment and thought content normal.  Nursing note and vitals reviewed.  Results for orders placed or performed in visit on 10/03/16  Lipid panel  Result Value Ref Range   Cholesterol 113 0 - 200 mg/dL   Triglycerides 318.0 (H) 0.0 - 149.0 mg/dL   HDL 26.40 (  L) >39.00 mg/dL   VLDL 63.6 (H) 0.0 - 40.0 mg/dL   Total CHOL/HDL Ratio 4    NonHDL 86.11   Comprehensive metabolic panel  Result Value Ref Range   Sodium 141 135 - 145 mEq/L   Potassium 4.8 3.5 - 5.1 mEq/L   Chloride 108 96 - 112 mEq/L   CO2 28 19 - 32 mEq/L   Glucose, Bld 134 (H) 70 - 99 mg/dL   BUN 20 6 - 23 mg/dL   Creatinine, Ser 1.37 0.40 - 1.50 mg/dL   Total Bilirubin 0.5 0.2 - 1.2 mg/dL   Alkaline Phosphatase 69 39 - 117 U/L   AST 11 0 - 37 U/L   ALT 12 0 - 53 U/L   Total Protein 6.4 6.0 - 8.3 g/dL   Albumin 3.8 3.5 - 5.2 g/dL   Calcium 8.9 8.4 - 10.5 mg/dL   GFR 58.45 (L) >60.00 mL/min  TSH  Result Value Ref Range   TSH 1.47 0.35 - 4.50 uIU/mL  Hemoglobin A1c  Result Value Ref Range   Hgb A1c MFr Bld 5.8 4.6 - 6.5 %  CBC with Differential/Platelet  Result Value Ref Range   WBC 5.0 4.0 - 10.5 K/uL   RBC 3.94 (L) 4.22 - 5.81 Mil/uL   Hemoglobin 11.0 (L) 13.0 - 17.0 g/dL   HCT 32.9 (L) 39.0 - 52.0 %   MCV 83.7 78.0 - 100.0 fl   MCHC 33.5 30.0 - 36.0 g/dL   RDW 17.4 (H)  11.5 - 15.5 %   Platelets 128.0 (L) 150.0 - 400.0 K/uL   Neutrophils Relative % 66.2 43.0 - 77.0 %   Lymphocytes Relative 21.6 12.0 - 46.0 %   Monocytes Relative 9.0 3.0 - 12.0 %   Eosinophils Relative 2.7 0.0 - 5.0 %   Basophils Relative 0.5 0.0 - 3.0 %   Neutro Abs 3.3 1.4 - 7.7 K/uL   Lymphs Abs 1.1 0.7 - 4.0 K/uL   Monocytes Absolute 0.4 0.1 - 1.0 K/uL   Eosinophils Absolute 0.1 0.0 - 0.7 K/uL   Basophils Absolute 0.0 0.0 - 0.1 K/uL  PSA  Result Value Ref Range   PSA 2.07 0.10 - 4.00 ng/mL  VITAMIN D 25 Hydroxy (Vit-D Deficiency, Fractures)  Result Value Ref Range   VITD 25.54 (L) 30.00 - 100.00 ng/mL  Magnesium  Result Value Ref Range   Magnesium 1.6 1.5 - 2.5 mg/dL  LDL cholesterol, direct  Result Value Ref Range   Direct LDL 55.0 mg/dL      Assessment & Plan:   Problem List Items Addressed This Visit    (HFpEF) heart failure with preserved ejection fraction ([place])    Echo 05/2016 with grade 2 DD, but normal EF and normal right heart. Will continue to monitor. Discussed importance of OSA treatment.       BPH (benign prostatic hyperplasia)    s/p urological evaluation - pt states told did not need f/u with urology.  Recent PSA stable.  Will continue to monitor here. Continue flomax and cardura. Discussed timing of administration.       CKD (chronic kidney disease) stage 3, GFR 30-59 ml/min    Improved readings recently. We will request last renal note to review.       Depression, major, single episode, moderate (HCC)    Chronic, stable on lexapro 10mg  daily. Continue.       Dyslipidemia    High trig, low HDL.  Reviewed healthy diet and lifestyle changes to affect improved  lipid control. Encouraged increased fatty fish in diet - he will check with [name] about adding fish oil supplement.       Hypomagnesemia    Continues BID magnesium.      Morbid obesity with BMI of 40.0-44.9, adult ([place])    Reviewed healthy diet and lifestyle changes to affect sustainable  weight loss.       OSA on CPAP    Advised he check with [facility] to eval CPAP machine to r/o malfunction, discussed daytime CPAP use. Reviewed complications of untreated OSA.       Pedal edema    Chronic, stable. Anticipate related to obesity and liver disease s/p transplant. Recent cards eval with normal EF. Echo from 05/2016 - normal right heart function, unable to assess pulm pressures.       Prediabetes    Stable - anticipate improvement now off prednisone.       Routine general medical examination at a health care facility - Primary    Preventative protocols reviewed and updated unless pt declined. Discussed healthy diet and lifestyle.       Status post liver transplant ([place])    Now only on prograf and ppx bactrim MWF. Prednisone stopped last week.       Thrombocytopenia (HCC)    Chronic, stable.           Follow up plan: Return in about 6 months (around 04/06/2017) for follow up visit.  Ria Bush, MD

## 2016-10-05 NOTE — Assessment & Plan Note (Signed)
Advised he check with [facility] to eval CPAP machine to r/o malfunction, discussed daytime CPAP use. Reviewed complications of untreated OSA.

## 2016-10-05 NOTE — Assessment & Plan Note (Signed)
Stable - anticipate improvement now off prednisone.

## 2016-10-05 NOTE — Assessment & Plan Note (Signed)
Preventative protocols reviewed and updated unless pt declined. Discussed healthy diet and lifestyle.  

## 2016-10-05 NOTE — Progress Notes (Signed)
Pre visit review using our clinic review tool, if applicable. No additional management support is needed unless otherwise documented below in the visit note. 

## 2016-10-05 NOTE — Assessment & Plan Note (Addendum)
Now only on prograf and ppx bactrim MWF. Prednisone stopped last week.

## 2016-10-05 NOTE — Assessment & Plan Note (Signed)
s/p urological evaluation - pt states told did not need f/u with urology.  Recent PSA stable.  Will continue to monitor here. Continue flomax and cardura. Discussed timing of administration.

## 2016-10-05 NOTE — Assessment & Plan Note (Signed)
Chronic, stable 

## 2016-10-05 NOTE — Assessment & Plan Note (Signed)
Improved readings recently. We will request last renal note to review.

## 2016-10-05 NOTE — Assessment & Plan Note (Signed)
Echo 05/2016 with grade 2 DD, but normal EF and normal right heart. Will continue to monitor. Discussed importance of OSA treatment.

## 2016-10-05 NOTE — Assessment & Plan Note (Addendum)
Chronic, stable. Anticipate related to obesity and liver disease s/p transplant. Recent cards eval with normal EF. Echo from 05/2016 - normal right heart function, unable to assess pulm pressures.

## 2016-10-05 NOTE — Assessment & Plan Note (Signed)
Reviewed healthy diet and lifestyle changes to affect sustainable weight loss.  

## 2016-10-05 NOTE — Assessment & Plan Note (Signed)
Chronic, stable on lexapro 10mg  daily. Continue.

## 2016-10-05 NOTE — Assessment & Plan Note (Signed)
High trig, low HDL.  Reviewed healthy diet and lifestyle changes to affect improved lipid control. Encouraged increased fatty fish in diet - he will check with [name] about adding fish oil supplement.

## 2016-10-07 ENCOUNTER — Telehealth: Payer: Self-pay | Admitting: Family Medicine

## 2016-10-07 NOTE — Telephone Encounter (Signed)
Received records from [facility] forwarded 4 pages to [facility].

## 2016-10-20 ENCOUNTER — Encounter: Payer: Self-pay | Admitting: Family Medicine

## 2016-10-22 MED ORDER — FISH OIL 1000 MG PO CAPS: 1.0000 | ORAL_CAPSULE | Freq: Every day | ORAL | 0 refills | Status: DC

## 2016-11-18 ENCOUNTER — Encounter: Payer: Self-pay | Admitting: Pulmonary Disease

## 2016-11-18 ENCOUNTER — Ambulatory Visit (INDEPENDENT_AMBULATORY_CARE_PROVIDER_SITE_OTHER): Payer: BLUE CROSS/BLUE SHIELD | Admitting: Pulmonary Disease

## 2016-11-18 DIAGNOSIS — G4733 Obstructive sleep apnea (adult) (pediatric): Secondary | ICD-10-CM

## 2016-11-18 DIAGNOSIS — R0609 Other forms of dyspnea: Secondary | ICD-10-CM

## 2016-11-18 DIAGNOSIS — Z9989 Dependence on other enabling machines and devices: Secondary | ICD-10-CM | POA: Diagnosis not present

## 2016-11-18 DIAGNOSIS — R0600 Dyspnea, unspecified: Secondary | ICD-10-CM | POA: Insufficient documentation

## 2016-11-18 NOTE — Assessment & Plan Note (Signed)
His compliance report on auto titrating CPAP 5-27 m of water shows that the 95th percentile pressure is 15.87 m water in his compliance is 100%. He reports feeling significant improvement in his fatigue since starting back on CPAP. At this time I see no reason to repeat a CPAP titration study.

## 2016-11-18 NOTE — Progress Notes (Signed)
Subjective:    Patient ID: Colton Porter, male    DOB: 04/20/1966, 51 y.o.   MRN: [id number]  Former patient of Dr. Gwenette Greet who has sleep apnea.  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  He has CHF, CKD.   He quit dipping tobacco in 2017.    HPI  Chief Complaint  Patient presents with  . Follow-up    pt doing well on cpap, notes difficulty with mask- states mask loosens throughout the night.  Also notes sob with exertion.     He has been using CPAP again and he says that he is much more energetic in the mornings now.  However by the end of the day he has a lot of leg swelling and fatigue.  This worsens as the day goes on.  He has been on the medicine for 2-3 weeks for diuresis.  He has not seen much improvement.    He has been told that he has "some heart issues, I don't know how bad".  He has been seen by cardiology but he hasn't been there.    He was told by his GI doctor that he had COPD.  He doesn't have chest congestion, wheezing or cough.    Past Medical History:  Diagnosis Date  . Alcohol dependence ([place]) 07/11/2012  . Alcoholic cirrhosis of liver with ascites ([place]) 07/2014   s./p transplant 12/2015  . Allergy   . Anemia   . Cellulitis of left leg   . Chronic diastolic heart failure ([place]) 11/01/2013  . CKD (chronic kidney disease) stage 3, GFR 30-59 ml/min 11/04/2015  . GERD (gastroesophageal reflux disease)   . Hyperlipidemia   . Hypertension   . Neuropathy   . OSA on CPAP 07/11/2012   HST 07/2013:  AHI 39/hr.    . Thrombocytopenia ([place]) 12/15/2011     Review of Systems  Constitutional: Positive for fatigue. Negative for chills and fever.  HENT: Negative for postnasal drip, rhinorrhea and sinus pain.   Respiratory: Positive for shortness of breath. Negative for cough and wheezing.   Cardiovascular: Positive for leg swelling. Negative for chest pain.       Objective:   Physical Exam  Vitals:   11/18/16 0926  BP: 134/70  Pulse: 77  SpO2:  97%  Weight: 297 lb (134.7 kg)  Height: 5\' 7"  (1.702 m)   Gen: obese, chronically ill appearing HENT: OP clear, TM's clear, neck supple PULM: CTA B, normal percussion CV: RRR, no mgr, massive leg edema GI: BS+, soft, nontender Derm: no cyanosis or rash Psyche: normal mood and affect  Records from cardiology in December 2017 reviewed were he was treated for CHF.  A 2017 echocardiogram showed poor visualization of the right ventricle and could not estimate PA pressure, LVEF 09%, diastolic dysfunction noted     Assessment & Plan:  Dyspnea He has dyspnea which is multifactorial at best. He is obese and deconditioned which are clearly contributing. However, his volume overloaded on exam today so I worry that his diastolic heart failure is most significantly contributing to this.  I have contacted the cardiology clinic and his cardiologist requesting that they see him within the next 1-2 weeks.  We will request records of your PFT from [facility] this morning.  Continue diuretic regimen as ordered.  OSA on CPAP His compliance report on auto titrating CPAP 5-27 m of water shows that the 95th percentile pressure is 15.87 m water in his compliance is 100%.  He reports feeling significant improvement in his fatigue since starting back on CPAP. At this time I see no reason to repeat a CPAP titration study.    Current Outpatient Prescriptions:  .  acetaminophen (TYLENOL) 500 MG tablet, Take 500 mg by mouth every 6 (six) hours as needed for mild pain., Disp: , Rfl:  .  amLODipine (NORVASC) 10 MG tablet, Take 10 mg by mouth daily., Disp: , Rfl:  .  aspirin 81 MG chewable tablet, Chew 81 mg by mouth daily., Disp: , Rfl:  .  cetirizine (ZYRTEC) 10 MG tablet, Take 10 mg by mouth daily. , Disp: , Rfl:  .  cholecalciferol (VITAMIN D) 1000 units tablet, Take 1,000 Units by mouth daily., Disp: , Rfl:  .  doxazosin (CARDURA) 8 MG tablet, Take 8 mg by mouth at bedtime. , Disp: , Rfl: 6 .  escitalopram  (LEXAPRO) 10 MG tablet, Take 1 tablet (10 mg total) by mouth daily., Disp: 30 tablet, Rfl: 3 .  fluticasone (FLONASE) 50 MCG/ACT nasal spray, Place 2 sprays into both nostrils daily. (Patient taking differently: Place 2 sprays into both nostrils daily as needed. ), Disp: 16 g, Rfl: 6 .  losartan (COZAAR) 50 MG tablet, Take 50 mg by mouth daily. , Disp: , Rfl: 0 .  magnesium oxide (MAG-OX) 400 (241.3 Mg) MG tablet, Take 1 tablet (400 mg total) by mouth 2 (two) times daily., Disp: , Rfl:  .  Multiple Vitamin (MULTIVITAMIN WITH MINERALS) TABS tablet, Take 2 tablets by mouth every evening., Disp: , Rfl:  .  omeprazole (PRILOSEC) 20 MG capsule, Take 20 mg by mouth 2 (two) times daily., Disp: , Rfl: 11 .  sulfamethoxazole-trimethoprim (BACTRIM,SEPTRA) 400-80 MG tablet, Take 1 tablet by mouth 3 (three) times a week. M,W,F, Disp: , Rfl:  .  tacrolimus (PROGRAF) 1 MG capsule, Take 2 mg by mouth 2 (two) times daily. , Disp: , Rfl:  .  tamsulosin (FLOMAX) 0.4 MG CAPS capsule, Take 0.8 mg by mouth daily. , Disp: , Rfl:  .  Omega-3 Fatty Acids (FISH OIL) 1000 MG CAPS, Take 1 capsule (1,000 mg total) by mouth daily. (Patient not taking: Reported on 11/18/2016), Disp: , Rfl: 0

## 2016-11-18 NOTE — Addendum Note (Signed)
Addended by: Len Blalock on: 11/18/2016 10:04 AM   Modules accepted: Orders

## 2016-11-18 NOTE — Patient Instructions (Signed)
I contacted Dr. Meda Coffee today and asked her to see you in 1-2 weeks. If you have not heard from them by the middle of next week please call me so we can help her June appointment Keep using CPAP as you are doing We will see you back in one year or sooner if needed

## 2016-11-18 NOTE — Assessment & Plan Note (Addendum)
He has dyspnea which is multifactorial at best. He is obese and deconditioned which are clearly contributing. However, his volume overloaded on exam today so I worry that his diastolic heart failure is most significantly contributing to this.  I have contacted the cardiology clinic and his cardiologist requesting that they see him within the next 1-2 weeks.  We will request records of your PFT from [facility] this morning.  Continue diuretic regimen as ordered.

## 2016-12-04 ENCOUNTER — Other Ambulatory Visit: Payer: Self-pay | Admitting: Family Medicine

## 2016-12-05 NOTE — Telephone Encounter (Signed)
AEX 10/05/16-note states con't Lexapro.  6 mo f/u as recommended, sch 04/06/2017. Last fill 07/19/16 #30 3R

## 2016-12-06 ENCOUNTER — Telehealth: Payer: Self-pay | Admitting: Family Medicine

## 2016-12-06 NOTE — Telephone Encounter (Signed)
fmla paperwork in Dr Darnell Level IN Box For review and signature

## 2016-12-09 NOTE — Telephone Encounter (Signed)
Forms filled and in my out box. 

## 2016-12-12 NOTE — Telephone Encounter (Signed)
Paperwork faxed Copy for file Copy for pt' Copy for scann Rhonda aware

## 2016-12-13 ENCOUNTER — Telehealth: Payer: Self-pay

## 2016-12-13 DIAGNOSIS — G4733 Obstructive sleep apnea (adult) (pediatric): Secondary | ICD-10-CM

## 2016-12-13 NOTE — Telephone Encounter (Signed)
-----   Message from Juanito Doom, MD sent at 12/09/2016  5:57 PM EDT ----- A, His ONO on CPAP showed that he needs 2L O2 bleed in through his CPAP.  Please Rx and let him know. Thanks, B

## 2016-12-13 NOTE — Telephone Encounter (Signed)
lmtcb X1 for pt to make aware of ONO results/recs.  Will order O2 after speaking to pt.

## 2016-12-14 NOTE — Telephone Encounter (Signed)
Order has been faxed to [facility] to for 2L O2 bled into cpap. Pt is aware and voiced his understanding. Nothing further needed.

## 2016-12-19 ENCOUNTER — Telehealth: Payer: Self-pay | Admitting: Cardiology

## 2016-12-19 ENCOUNTER — Telehealth: Payer: Self-pay

## 2016-12-19 MED ORDER — AMLODIPINE BESYLATE 10 MG PO TABS: 10.0000 mg | ORAL_TABLET | Freq: Every day | ORAL | 0 refills | Status: DC

## 2016-12-19 NOTE — Telephone Encounter (Signed)
Refill for 30 days has been [facility] to [facility] [address].  I informed Mrs. Barney that he needs to make a follow up appointment for further refills. She will call back later today to schedule his follow up appointment.

## 2016-12-19 NOTE — Telephone Encounter (Signed)
New message    Pt wife is calling stating that pt has been out of medication since Friday. She is asking for a call.   *STAT* If patient is at the pharmacy, call can be transferred to refill team.   1. Which medications need to be refilled? (please list name of each medication and dose if known) amlodipine 10 mg  2. Which pharmacy/location (including street and city if local pharmacy) is medication to be sent to? [facility] [address]  3. Do they need a 30 day or 90 day supply? 30 day

## 2016-12-19 NOTE — Telephone Encounter (Signed)
Pt has been getting amlodipine from liver transplant doctor in [place] but was advised by transplant dr to see cardiologist for further refills. Pt contacted heart doctor and was given one refill and is supposed to make a f/u appt with card. Pt wonders if he should see Dr Darnell Level for f/u since he is the primary care doctor. Pt request cb with Dr Synthia Innocent opinion of who pt should f/u with; Dr Darnell Level or cardiology. Pt is aware will not get cb until 12/20/16. Pt said the amlodipine has been refilled so he is fine until cb from Dr Synthia Innocent office.Please advise. Last annual 10/05/16.

## 2016-12-20 NOTE — Telephone Encounter (Signed)
Looks like was due for cards f/u - would start there for now.

## 2016-12-21 ENCOUNTER — Telehealth: Payer: Self-pay | Admitting: Cardiology

## 2016-12-21 MED ORDER — AMLODIPINE BESYLATE 10 MG PO TABS: 10.0000 mg | ORAL_TABLET | Freq: Every day | ORAL | 0 refills | Status: DC

## 2016-12-21 NOTE — Telephone Encounter (Signed)
Lm on pts vm and advised per Dr Darnell Level.

## 2016-12-21 NOTE — Telephone Encounter (Signed)
°  New Prob   *STAT* If patient is at the pharmacy, call can be transferred to refill team.   1. Which medications need to be refilled? (please list name of each medication and dose if known) amLODipine (NORVASC) 10 MG tablet  2. Which pharmacy/location (including street and city if local pharmacy) is medication to be sent to? [facility] [address]  3. Do they need a 30 day or 90 day supply? [place]

## 2016-12-21 NOTE — Telephone Encounter (Signed)
Medication refill sent to pharmacy  

## 2016-12-22 ENCOUNTER — Ambulatory Visit (INDEPENDENT_AMBULATORY_CARE_PROVIDER_SITE_OTHER): Payer: BLUE CROSS/BLUE SHIELD | Admitting: Cardiology

## 2016-12-22 ENCOUNTER — Encounter: Payer: Self-pay | Admitting: Cardiology

## 2016-12-22 VITALS — BP 130/68 | HR 68 | Ht 67.0 in | Wt 294.0 lb

## 2016-12-22 DIAGNOSIS — I5022 Chronic systolic (congestive) heart failure: Secondary | ICD-10-CM

## 2016-12-22 DIAGNOSIS — R0789 Other chest pain: Secondary | ICD-10-CM | POA: Diagnosis not present

## 2016-12-22 MED ORDER — AMLODIPINE BESYLATE 10 MG PO TABS: 10.0000 mg | ORAL_TABLET | Freq: Every day | ORAL | 3 refills | Status: DC

## 2016-12-22 MED ORDER — AMLODIPINE BESYLATE 10 MG PO TABS: 10.0000 mg | ORAL_TABLET | Freq: Every day | ORAL | 6 refills | Status: DC

## 2016-12-22 NOTE — Patient Instructions (Signed)
Medication Instructions:   Your physician recommends that you continue on your current medications as directed. Please refer to the Current Medication list given to you today.    If you need a refill on your cardiac medications before your next appointment, please call your pharmacy.  Labwork: NONE ORDERED  TODAY    Testing/Procedures: NONE ORDERED  TODAY    Follow-Up: Your physician wants you to follow-up in:  IN  6  MONTHS WITH DR NELSON  You will receive a reminder letter in the mail two months in advance. If you don't receive a letter, please call our office to schedule the follow-up appointment.      Any Other Special Instructions Will Be Listed Below (If Applicable).                                                                                                                                                   

## 2016-12-22 NOTE — Progress Notes (Signed)
12/22/2016 Colton Porter   [birth year]  [id number]  Primary Physician Colton Bush, MD Primary Cardiologist: Colton Porter  Reason for Visit/CC: 6 month f/u for chronic diastolic HF and HTN.  HPI:  Colton Porter is a 51 y.o. male  followed by Colton Porter, who presents to clinic for f/u. He is in need of medication refills. He is out of amlodipine for HTN.  He has PMH of hyperlipidemia,CKD stage III,obesity, OSA on CPAP since 06/2013, hypertension, chronic diastolic dysfunction, chronic thrombocytopenia with platelet around 50, significant drinking history with subsequent alcoholic cirrhosis. He underwent liver transplant in July 2017 and is being followed by [facility] liver transplant team . He has family history of early CAD with his brother having first MI at age 87. He had left and right heart catheterization on 06/10/2013 which showed normal coronaries, elevated right heart pressure likely related to diastolic dysfunction.  He was evaluated 6 months ago for dyspnea.  2D echo 05/2016 showed normal LVEF at 55-60% with grade 2DD. No wall motion abnormalities. No significant valvular dysfunction nor evidence of pericardial effusion. PA pressure could not be adequately estimated. He also had basic labs including a BNP that was normal at 20. NST was negative for ischemia. He was sent to pulmonology for PFTs. He has been followed by Colton Porter. His CPAP was adjusted.   He presents back to clinic today for 6 month f/u and is also in need of refills of his amlodipine. He has done well. Breathing improved, however he still has mild exertional dyspnea, which he thinks is related to his weight. He is close to 300 lb, which he reports is the heaviest he has been.  He is happy that he has lost 6 lb. He is compliant with CPAP. Compliant with meds. Tolerating them well w/o side effects. BP is well controlled at 130/68. EKG shows NSR. No ischemia.     Current Meds  Medication Sig  . acetaminophen (TYLENOL)  500 MG tablet Take 500 mg by mouth every 6 (six) hours as needed for mild pain.  Marland Kitchen amLODipine (NORVASC) 10 MG tablet Take 1 tablet (10 mg total) by mouth daily.  . ASPIR-LOW 81 MG EC tablet Take 81 mg by mouth daily.  . cetirizine (ZYRTEC) 10 MG tablet Take 10 mg by mouth daily.   . cholecalciferol (VITAMIN D) 1000 units tablet Take 1,000 Units by mouth daily.  Marland Kitchen doxazosin (CARDURA) 8 MG tablet Take 8 mg by mouth at bedtime.   Marland Kitchen escitalopram (LEXAPRO) 10 MG tablet TAKE 1 TABLET BY MOUTH EVERY DAY  . fluticasone (FLONASE) 50 MCG/ACT nasal spray Place 2 sprays into both nostrils daily. (Patient taking differently: Place 2 sprays into both nostrils daily as needed. )  . furosemide (LASIX) 20 MG tablet Take 40 mg by mouth daily.  Marland Kitchen losartan (COZAAR) 50 MG tablet Take 50 mg by mouth daily.   . magnesium oxide (MAG-OX) 400 (241.3 Mg) MG tablet Take 1 tablet (400 mg total) by mouth 2 (two) times daily.  . Multiple Vitamin (MULTIVITAMIN WITH MINERALS) TABS tablet Take 2 tablets by mouth every evening.  . Omega-3 Fatty Acids (FISH OIL) 1000 MG CAPS Take 1 capsule (1,000 mg total) by mouth daily.  Marland Kitchen omeprazole (PRILOSEC) 20 MG capsule Take 20 mg by mouth 2 (two) times daily.  Marland Kitchen sulfamethoxazole-trimethoprim (BACTRIM,SEPTRA) 400-80 MG tablet Take 1 tablet by mouth 3 (three) times a week. M,W,F  . tacrolimus (PROGRAF) 1 MG capsule Take 2 mg by mouth 2 (  two) times daily.   . tamsulosin (FLOMAX) 0.4 MG CAPS capsule Take 0.8 mg by mouth daily.    Allergies  Allergen Reactions  . Nsaids Other (See Comments)    cirrhosis  . Ambien [Zolpidem Tartrate] Other (See Comments)    Over-toxicity from liver failure  . Morphine And Related     Hallucinations.  . Hydrochlorothiazide W-Triamterene Other (See Comments)    REACTION: dizzy, nausea  . Lisinopril Other (See Comments)    REACTION: cough, decreased libido   Past Medical History:  Diagnosis Date  . Alcohol dependence ([place]) 07/11/2012  . Alcoholic  cirrhosis of liver with ascites ([place]) 07/2014   s./p transplant 12/2015  . Allergy   . Anemia   . Cellulitis of left leg   . Chronic diastolic heart failure ([place]) 11/01/2013  . CKD (chronic kidney disease) stage 3, GFR 30-59 ml/min 11/04/2015  . GERD (gastroesophageal reflux disease)   . Hyperlipidemia   . Hypertension   . Neuropathy   . OSA on CPAP 07/11/2012   HST 07/2013:  AHI 39/hr.    . Thrombocytopenia ([place]) 12/15/2011   Family History  Problem Relation Age of Onset  . Stroke Mother   . Emphysema Mother   . Hypertension Father   . Heart disease Father   . Emphysema Father   . Lung cancer Maternal Aunt   . Stroke Paternal Grandmother   . Heart attack Neg Hx    Past Surgical History:  Procedure Laterality Date  . COLONOSCOPY  08/2013   hyperplastic polyps, hemorrhoids Colton Porter)  . 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 HAND SURGERY  1997  . LEFT AND RIGHT HEART CATHETERIZATION WITH CORONARY ANGIOGRAM N/A 06/10/2013   Procedure: LEFT AND RIGHT HEART CATHETERIZATION WITH CORONARY ANGIOGRAM;  Surgeon: Colton Ohara, MD;  Location: [facility] CATH LAB;  Service: Cardiovascular;  Laterality: N/A;  . LIVER TRANSPLANTATION  65/4650   alcoholic cirrhosis (Colton Porter/Colton Porter at [facility])   Social History   Social History  . Marital status: Married    Spouse name: N/A  . Number of children: 2  . Years of education: N/A   Occupational History  . [employer]     no work lately   Social History Main Topics  . Smoking status: Never Smoker  . Smokeless tobacco: Former Systems developer    Types: Chew  . Alcohol use No     Comment: 4-6 drinks daily - NO ETOH SINCE APRIL 2016  . Drug use: Yes     Comment: remote use of marijuana in the past, has since quit  . Sexual activity: No   Other Topics Concern  . Not on file   Social History Narrative    Lives with wife   Occupation: full disability after cirrhosis dx, some working    Activity: some yardwork    Diet: good water, good fruit intake, lots of red meats     Review of Systems: General: negative for chills, fever, night sweats or weight changes.  Cardiovascular: negative for chest pain, dyspnea on exertion, edema, orthopnea, palpitations, paroxysmal nocturnal dyspnea or shortness of breath Dermatological: negative for rash Respiratory: negative for cough or wheezing Urologic: negative for hematuria Abdominal: negative for nausea, vomiting, diarrhea, bright red blood per rectum, melena, or hematemesis Neurologic: negative for visual changes, syncope, or dizziness All other systems reviewed and are otherwise negative except as noted above.   Physical Exam:  Blood pressure 130/68, pulse 68, height 5\' 7"  (1.702 m),  weight 294 lb (133.4 kg).  General appearance: alert, cooperative, no distress and morbidly obese Neck: no carotid bruit and no JVD Lungs: clear to auscultation bilaterally Heart: regular rate and rhythm, S1, S2 normal, no murmur, click, rub or gallop Extremities: trace LEE Pulses: 2+ and symmetric Skin: Skin color, texture, turgor normal. No rashes or lesions Neurologic: Grossly normal  EKG NSR, 77 bpm no ischemia  -- personally reviewed   ASSESSMENT AND PLAN:   1. Chronic Diastolic HF: trace bilateral LEE on exam (? From amlodipine), no significant dyspnea. His PCP added back lasix, 20 mg. He has f/u BMP scheduled for next week. BP is stable.   2. HTN: controlled on current regimen: amlodipine, Losartan and Lasix. BMP scheduled next week at PCP office. He is tolerating meds well w/o side effects. We will refill amlodipine Rx.   3. H/o Liver Transplant: followed by specialist in [place].   4. OSA: compliant with CPAP nightly. Followed by pulmonology.   5. CKD: followed by nephrology.   6. Obesity: pt recently loss 6 lb and knows that he needs to continue to work on weight loss.   7. DLD: not on statin given h/o liver transplant.   Follow-Up with Colton Porter in 6  months.   Colton Porter Ladoris Gene, MHS [facility] 12/22/2016 8:41 AM

## 2016-12-23 ENCOUNTER — Telehealth: Payer: Self-pay | Admitting: Pulmonary Disease

## 2016-12-23 NOTE — Telephone Encounter (Signed)
Wife states that she contacted [facility] because her husband had yet to receive his CPAP supplies and she was advised per [facility] that they have not received the Itemized Rx order form back from our office. Wife states that this was faxed to our office about 1 month ago and [facility] will not give supplies until they get this back. Wife states that she was told the deadline for this form to be sent back is January 06, 2017.   Form needs to be faxed to [phone] ATTN: Dream Team  Will need to contact [facility] to get more information.

## 2016-12-23 NOTE — Telephone Encounter (Signed)
Will hold in triage to follow up Monday morning.

## 2016-12-23 NOTE — Telephone Encounter (Signed)
Called and spoke with Corene Cornea from [facility] and he stated that the pts insurance requires more information and they have faxed over a form to be completed by Tiffany Kocher has refaxed this form to BQ attention and I have called the pt and lmomtcb for the pt.

## 2016-12-26 NOTE — Telephone Encounter (Signed)
Spoke with Corene Cornea due to checking Ashley's cubby and not seeing the form. Corene Cornea states it was a CMN form he faxed over.  Rodena Piety did you receive this fax?

## 2016-12-27 NOTE — Telephone Encounter (Signed)
No I don't have any forms at all for this patient

## 2017-01-05 NOTE — Telephone Encounter (Signed)
This form was in Dr. Anastasia Pall look at stuff I pulled it out an placed it in the CMN folder for hiim to sign

## 2017-01-06 NOTE — Telephone Encounter (Signed)
According to [place], [facility] see below Sheran Lawless, Sandre Kitty        According to our resupply team, they have received everything they need and left a message on patient's voicemail on 12/27/16. They will try him again.  Thanks!

## 2017-01-16 NOTE — Telephone Encounter (Signed)
This form has now been signed by Dr. Lake Bells and it has been faxed to [facility] today

## 2017-01-18 ENCOUNTER — Encounter: Payer: Self-pay | Admitting: Family Medicine

## 2017-01-18 NOTE — Telephone Encounter (Signed)
Pt with dysuria and back pain. plz call to schedule OV.

## 2017-01-19 ENCOUNTER — Encounter: Payer: Self-pay | Admitting: Family Medicine

## 2017-01-19 ENCOUNTER — Telehealth: Payer: Self-pay

## 2017-01-19 ENCOUNTER — Ambulatory Visit (INDEPENDENT_AMBULATORY_CARE_PROVIDER_SITE_OTHER): Payer: BLUE CROSS/BLUE SHIELD | Admitting: Family Medicine

## 2017-01-19 ENCOUNTER — Ambulatory Visit (INDEPENDENT_AMBULATORY_CARE_PROVIDER_SITE_OTHER)
Admission: RE | Admit: 2017-01-19 | Discharge: 2017-01-19 | Disposition: A | Discharge status: Home / Self Care | Payer: BLUE CROSS/BLUE SHIELD | Source: Ambulatory Visit | Attending: Family Medicine | Admitting: Family Medicine

## 2017-01-19 DIAGNOSIS — M545 Low back pain, unspecified: Secondary | ICD-10-CM | POA: Insufficient documentation

## 2017-01-19 DIAGNOSIS — R300 Dysuria: Secondary | ICD-10-CM | POA: Insufficient documentation

## 2017-01-19 LAB — POC URINALSYSI DIPSTICK (AUTOMATED)
BILIRUBIN UA: NEGATIVE
Blood, UA: NEGATIVE
GLUCOSE UA: NEGATIVE
KETONES UA: NEGATIVE
Leukocytes, UA: NEGATIVE
Nitrite, UA: NEGATIVE
PROTEIN UA: NEGATIVE
Spec Grav, UA: 1.025 (ref 1.010–1.025)
Urobilinogen, UA: 0.2 E.U./dL
pH, UA: 6.5 (ref 5.0–8.0)

## 2017-01-19 MED ORDER — CYCLOBENZAPRINE HCL 5 MG PO TABS: 5.0000 mg | ORAL_TABLET | Freq: Every evening | ORAL | 1 refills | Status: DC | PRN

## 2017-01-19 NOTE — Progress Notes (Signed)
Subjective:   Patient ID: Colton Porter, male    DOB: 1965-12-02, 51 y.o.   MRN: [id number]  Colton Porter is a pleasant 51 y.o. year old male pt of Dr. Darnell Level, new to me, with complicated medical history who presents to clinic today with Back Pain  on 01/19/2017  HPI:  5 days of low back pain along with some dysuria and increased urinary frequency.  3 days ago, he bent over to pick up a piece of weed and now cannot walk without low back pain.  Has been using tylenol and heating pad with mild relief of symptoms.  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  On prograf. Can take tylenol but avoids NSAIDs.  Dysuria has resolved by back pain, localized more to right lower back, is a 10/10 at times.  Most painful when he sits or stands and first starts to walk.  Does feel that it is radiating down his leg with certain positions.  No LE weakness.   Lab Results  Component Value Date   ALT 12 10/03/2016   AST 11 10/03/2016   GGT 45 10/26/2015   ALKPHOS 69 10/03/2016   BILITOT 0.5 10/03/2016   Lab Results  Component Value Date   CREATININE 1.37 10/03/2016   Current Outpatient Prescriptions on File Prior to Visit  Medication Sig Dispense Refill  . acetaminophen (TYLENOL) 500 MG tablet Take 500 mg by mouth every 6 (six) hours as needed for mild pain.    Marland Kitchen amLODipine (NORVASC) 10 MG tablet Take 1 tablet (10 mg total) by mouth daily. 90 tablet 3  . ASPIR-LOW 81 MG EC tablet Take 81 mg by mouth daily.  11  . cetirizine (ZYRTEC) 10 MG tablet Take 10 mg by mouth daily.     . cholecalciferol (VITAMIN D) 1000 units tablet Take 1,000 Units by mouth daily.    Marland Kitchen doxazosin (CARDURA) 8 MG tablet Take 8 mg by mouth at bedtime.   6  . escitalopram (LEXAPRO) 10 MG tablet TAKE 1 TABLET BY MOUTH EVERY DAY 30 tablet 6  . fluticasone (FLONASE) 50 MCG/ACT nasal spray Place 2 sprays into both nostrils daily. (Patient taking differently: Place 2 sprays into both nostrils daily as needed. ) 16 g  6  . furosemide (LASIX) 20 MG tablet Take 40 mg by mouth daily.  3  . losartan (COZAAR) 50 MG tablet Take 50 mg by mouth daily.   0  . magnesium oxide (MAG-OX) 400 (241.3 Mg) MG tablet Take 1 tablet (400 mg total) by mouth 2 (two) times daily.    . Multiple Vitamin (MULTIVITAMIN WITH MINERALS) TABS tablet Take 2 tablets by mouth every evening.    . Omega-3 Fatty Acids (FISH OIL) 1000 MG CAPS Take 1 capsule (1,000 mg total) by mouth daily.  0  . omeprazole (PRILOSEC) 20 MG capsule Take 20 mg by mouth 2 (two) times daily.  11  . sulfamethoxazole-trimethoprim (BACTRIM,SEPTRA) 400-80 MG tablet Take 1 tablet by mouth 3 (three) times a week. M,W,F    . tacrolimus (PROGRAF) 1 MG capsule Take 2 mg by mouth 2 (two) times daily.     . tamsulosin (FLOMAX) 0.4 MG CAPS capsule Take 0.8 mg by mouth daily.      No current facility-administered medications on file prior to visit.     Allergies  Allergen Reactions  . Nsaids Other (See Comments)    cirrhosis  . Ambien [Zolpidem Tartrate] Other (See Comments)    Over-toxicity from  liver failure  . Morphine And Related     Hallucinations.  . Hydrochlorothiazide W-Triamterene Other (See Comments)    REACTION: dizzy, nausea  . Lisinopril Other (See Comments)    REACTION: cough, decreased libido    Past Medical History:  Diagnosis Date  . Alcohol dependence ([place]) 07/11/2012  . Alcoholic cirrhosis of liver with ascites ([place]) 07/2014   s./p transplant 12/2015  . Allergy   . Anemia   . Cellulitis of left leg   . Chronic diastolic heart failure ([place]) 11/01/2013  . CKD (chronic kidney disease) stage 3, GFR 30-59 ml/min 11/04/2015  . GERD (gastroesophageal reflux disease)   . Hyperlipidemia   . Hypertension   . Neuropathy   . OSA on CPAP 07/11/2012   HST 07/2013:  AHI 39/hr.    . Thrombocytopenia ([place]) 12/15/2011    Past Surgical History:  Procedure Laterality Date  . COLONOSCOPY  08/2013   hyperplastic polyps, hemorrhoids Ardis Hughs)  .  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 HAND SURGERY  1997  . LEFT AND RIGHT HEART CATHETERIZATION WITH CORONARY ANGIOGRAM N/A 06/10/2013   Procedure: LEFT AND RIGHT HEART CATHETERIZATION WITH CORONARY ANGIOGRAM;  Surgeon: Blane Ohara, MD;  Location: [facility] CATH LAB;  Service: Cardiovascular;  Laterality: N/A;  . LIVER TRANSPLANTATION  25/3664   alcoholic cirrhosis ([facility])    Family History  Problem Relation Age of Onset  . Stroke Mother   . Emphysema Mother   . Hypertension Father   . Heart disease Father   . Emphysema Father   . Lung cancer Maternal Aunt   . Stroke Paternal Grandmother   . Heart attack Neg Hx     Social History   Social History  . Marital status: Married    Spouse name: N/A  . Number of children: 2  . Years of education: N/A   Occupational History  . [employer]     no work lately   Social History Main Topics  . Smoking status: Never Smoker  . Smokeless tobacco: Former Systems developer    Types: Chew  . Alcohol use No     Comment: 4-6 drinks daily - NO ETOH SINCE APRIL 2016  . Drug use: Yes     Comment: remote use of marijuana in the past, has since quit  . Sexual activity: No   Other Topics Concern  . Not on file   Social History Narrative    Lives with wife   Occupation: full disability after cirrhosis dx, some working    Activity: some yardwork   Diet: good water, good fruit intake, lots of red meats   The PMH, PSH, Social History, Family History, Medications, and allergies have been reviewed in [facility], and have been updated if relevant.   Review of Systems  Constitutional: Negative.   Genitourinary: Negative for difficulty urinating, dysuria, enuresis, flank pain, frequency, genital sores, hematuria, penile pain and testicular pain.  Musculoskeletal: Positive for back pain. Negative for gait problem, joint swelling, myalgias, neck pain and neck stiffness.  Neurological: Negative for  tremors, weakness and numbness.  All other systems reviewed and are negative.      Objective:    BP (!) 156/80   Pulse 90   Ht 5\' 7"  (1.702 m)   Wt 290 lb (131.5 kg)   SpO2 98%   BMI 45.42 kg/m    Physical Exam  Constitutional: He is oriented to person, place, and time. He appears well-developed and  well-nourished. No distress.  HENT:  Head: Normocephalic and atraumatic.  Eyes: Conjunctivae are normal.  Cardiovascular: Normal rate.   Pulmonary/Chest: Effort normal.  Abdominal: Soft.  Musculoskeletal:       Lumbar back: He exhibits spasm. He exhibits no bony tenderness.  Pos SLR right, neg SLR left Ataxic gait  Neurological: He is alert and oriented to person, place, and time. No cranial nerve deficit.  Skin: Skin is warm and dry. He is not diaphoretic.  Psychiatric: He has a normal mood and affect. His behavior is normal. Judgment and thought content normal.  Nursing note and vitals reviewed.         Assessment & Plan:   Low back pain, unspecified back pain laterality, unspecified chronicity, with sciatica presence unspecified No Follow-up on file.

## 2017-01-19 NOTE — Telephone Encounter (Signed)
Great.  Thanks for the update.  Let's continue with flexeril, heat and tylenol like we discussed today.  Keep [name] updated.

## 2017-01-19 NOTE — Telephone Encounter (Signed)
Pt left v/m; pt was seen earlier today about back problems and pt was given flexeril;and pt checked with liver transplant team and they [facility] any pain medication. Pt wants to know if Dr Deborra Medina wants to prescribe any other meds with this information. Pt request cb.

## 2017-01-19 NOTE — Assessment & Plan Note (Signed)
New- most consistent with lumbar spasm but given severity of pain with radiculopathy in a transplant patient, will proceed with xray today. eRx sent for flexeril but he will check with his liver doctor before starting this. The patient indicates understanding of these issues and agrees with the plan.

## 2017-01-19 NOTE — Patient Instructions (Signed)
Great to meet you.  I will call you with your xray results.  Okay to continue heating pad, tylenol and I have sent in flexeril to take 5 mg nightly as needed-- PLEASE CHECK WITH YOUR LIVER DOCTOR IF IT IS OKAY TO TAKE FLEXERIL FIRST.   Keep me updated.

## 2017-01-19 NOTE — Assessment & Plan Note (Addendum)
UA neg and symptoms resolved - unlikely related to current back pain. He will keep [name] updated if these symptoms re occur.

## 2017-01-23 NOTE — Telephone Encounter (Signed)
Left message for Colton Porter to continue with flexeril, heat and tylenol as discussed at his office visit per Dr. Deborra Medina.  I asked that he also keep [name] updated.

## 2017-02-06 ENCOUNTER — Encounter: Payer: Self-pay | Admitting: Internal Medicine

## 2017-02-06 ENCOUNTER — Ambulatory Visit (INDEPENDENT_AMBULATORY_CARE_PROVIDER_SITE_OTHER): Payer: BLUE CROSS/BLUE SHIELD | Admitting: Internal Medicine

## 2017-02-06 VITALS — BP 132/78 | HR 82 | Temp 98.0°F | Wt 287.0 lb

## 2017-02-06 DIAGNOSIS — D692 Other nonthrombocytopenic purpura: Secondary | ICD-10-CM | POA: Diagnosis not present

## 2017-02-06 DIAGNOSIS — L249 Irritant contact dermatitis, unspecified cause: Secondary | ICD-10-CM | POA: Diagnosis not present

## 2017-02-06 MED ORDER — TRIAMCINOLONE ACETONIDE 0.1 % EX CREA: 1.0000 "application " | TOPICAL_CREAM | Freq: Two times a day (BID) | CUTANEOUS | 0 refills | Status: DC

## 2017-02-06 NOTE — Patient Instructions (Signed)
Contact Dermatitis Dermatitis is redness, soreness, and swelling (inflammation) of the skin. Contact dermatitis is a reaction to certain substances that touch the skin. You either touched something that irritated your skin, or you have allergies to something you touched. Follow these instructions at home: Skin Care  Moisturize your skin as needed.  Apply cool compresses to the affected areas.  Try taking a bath with: ? Epsom salts. Follow the instructions on the package. You can get these at a pharmacy or grocery store. ? Baking soda. Pour a small amount into the bath as told by your doctor. ? Colloidal oatmeal. Follow the instructions on the package. You can get this at a pharmacy or grocery store.  Try applying baking soda paste to your skin. Stir water into baking soda until it looks like paste.  Do not scratch your skin.  Bathe less often.  Bathe in lukewarm water. Avoid using hot water. Medicines  Take or apply over-the-counter and prescription medicines only as told by your doctor.  If you were prescribed an antibiotic medicine, take or apply your antibiotic as told by your doctor. Do not stop taking the antibiotic even if your condition starts to get better. General instructions  Keep all follow-up visits as told by your doctor. This is important.  Avoid the substance that caused your reaction. If you do not know what caused it, keep a journal to try to track what caused it. Write down: ? What you eat. ? What cosmetic products you use. ? What you drink. ? What you wear in the affected area. This includes jewelry.  If you were given a bandage (dressing), take care of it as told by your doctor. This includes when to change and remove it. Contact a doctor if:  You do not get better with treatment.  Your condition gets worse.  You have signs of infection such as: ? Swelling. ? Tenderness. ? Redness. ? Soreness. ? Warmth.  You have a fever.  You have new  symptoms. Get help right away if:  You have a very bad headache.  You have neck pain.  Your neck is stiff.  You throw up (vomit).  You feel very sleepy.  You see red streaks coming from the affected area.  Your bone or joint underneath the affected area becomes painful after the skin has healed.  The affected area turns darker.  You have trouble breathing. This information is not intended to replace advice given to you by your health care provider. Make sure you discuss any questions you have with your health care provider. Document Released: 04/10/2009 Document Revised: 11/19/2015 Document Reviewed: 10/29/2014 Elsevier Interactive Patient Education  2018 Elsevier Inc.  

## 2017-02-06 NOTE — Progress Notes (Signed)
Subjective:    Patient ID: Colton Porter, male    DOB: 1966-06-10, 51 y.o.   MRN: [id number]  HPI  Pt presents to the clinic today with c/o a rash. He noticed this first on the top of his left foot about 1-2 weeks ago. He reports within the last 3-4 days, the rash has spread to his right ankle. He reports the rash burns but does not itch. He feels like the rash is warm to touch. He has not noticed any drainage from the area. He has tried steroid cream and antifungal cream OTC without any relief. He denies coming in contact with anything that he may be allergic to.   Review of Systems  Past Medical History:  Diagnosis Date  . Alcohol dependence ([place]) 07/11/2012  . Alcoholic cirrhosis of liver with ascites ([place]) 07/2014   s./p transplant 12/2015  . Allergy   . Anemia   . Cellulitis of left leg   . Chronic diastolic heart failure ([place]) 11/01/2013  . CKD (chronic kidney disease) stage 3, GFR 30-59 ml/min 11/04/2015  . GERD (gastroesophageal reflux disease)   . Hyperlipidemia   . Hypertension   . Neuropathy   . OSA on CPAP 07/11/2012   HST 07/2013:  AHI 39/hr.    . Thrombocytopenia ([place]) 12/15/2011    Current Outpatient Prescriptions  Medication Sig Dispense Refill  . acetaminophen (TYLENOL) 500 MG tablet Take 500 mg by mouth every 6 (six) hours as needed for mild pain.    Marland Kitchen amLODipine (NORVASC) 10 MG tablet Take 1 tablet (10 mg total) by mouth daily. 90 tablet 3  . ASPIR-LOW 81 MG EC tablet Take 81 mg by mouth daily.  11  . cetirizine (ZYRTEC) 10 MG tablet Take 10 mg by mouth daily.     . cholecalciferol (VITAMIN D) 1000 units tablet Take 1,000 Units by mouth daily.    Marland Kitchen doxazosin (CARDURA) 8 MG tablet Take 8 mg by mouth at bedtime.   6  . escitalopram (LEXAPRO) 10 MG tablet TAKE 1 TABLET BY MOUTH EVERY DAY 30 tablet 6  . furosemide (LASIX) 20 MG tablet Take 40 mg by mouth daily.  3  . losartan (COZAAR) 50 MG tablet Take 50 mg by mouth daily.   0  . Multiple Vitamin (MULTIVITAMIN  WITH MINERALS) TABS tablet Take 2 tablets by mouth every evening.    . tacrolimus (PROGRAF) 1 MG capsule Take 2 mg by mouth 2 (two) times daily.     . tamsulosin (FLOMAX) 0.4 MG CAPS capsule Take 0.8 mg by mouth daily.      No current facility-administered medications for this visit.     Allergies  Allergen Reactions  . Nsaids Other (See Comments)    cirrhosis  . Ambien [Zolpidem Tartrate] Other (See Comments)    Over-toxicity from liver failure  . Morphine And Related     Hallucinations.  . Hydrochlorothiazide W-Triamterene Other (See Comments)    REACTION: dizzy, nausea  . Lisinopril Other (See Comments)    REACTION: cough, decreased libido    Family History  Problem Relation Age of Onset  . Stroke Mother   . Emphysema Mother   . Hypertension Father   . Heart disease Father   . Emphysema Father   . Lung cancer Maternal Aunt   . Stroke Paternal Grandmother   . Heart attack Neg Hx     Social History   Social History  . Marital status: Married    Spouse name: N/A  .  Number of children: 2  . Years of education: N/A   Occupational History  . [employer]     no work lately   Social History Main Topics  . Smoking status: Never Smoker  . Smokeless tobacco: Former Systems developer    Types: Chew  . Alcohol use No     Comment: 4-6 drinks daily - NO ETOH SINCE APRIL 2016  . Drug use: Yes     Comment: remote use of marijuana in the past, has since quit  . Sexual activity: No   Other Topics Concern  . Not on file   Social History Narrative    Lives with wife   Occupation: full disability after cirrhosis dx, some working    Activity: some yardwork   Diet: good water, good fruit intake, lots of red meats     Constitutional: Denies fever, malaise, fatigue, headache or abrupt weight changes.  Skin: Pt reports rash. Denies ulcercations.    No other specific complaints in a complete review of systems (except as listed in HPI above).     Objective:   Physical  Exam  BP 132/78   Pulse 82   Temp 98 F (36.7 C) (Oral)   Wt 287 lb (130.2 kg)   SpO2 96%   BMI 44.95 kg/m  Wt Readings from Last 3 Encounters:  02/06/17 287 lb (130.2 kg)  01/19/17 290 lb (131.5 kg)  12/22/16 294 lb (133.4 kg)    General: Appears his stated age, obese in NAD. Skin: 3 cm x 2 cm group maculopapular rash noted on medial right ankle. He has similar lesion just below the 1st metatarsal on the left foot. He has 3 cm x 1.5 cm of purpura noted just below the 3rd, 4th and 5th metatarsal on his left foot. Cardiovascular: Pedal pulse 2+ bilaterally. Trace BLE edema.   BMET    Component Value Date/Time   NA 141 10/03/2016 0817   NA 138 03/28/2016   NA 133 10/26/2015   NA 138 08/02/2012 0839   K 4.8 10/03/2016 0817   K 4.3 10/26/2015   K 4.2 08/02/2012 0839   CL 108 10/03/2016 0817   CL 105 08/02/2012 0839   CO2 28 10/03/2016 0817   CO2 24 08/02/2012 0839   GLUCOSE 134 (H) 10/03/2016 0817   GLUCOSE 100 (H) 08/02/2012 0839   BUN 20 10/03/2016 0817   BUN 23 (A) 03/28/2016   BUN 6.3 (L) 08/02/2012 0839   CREATININE 1.37 10/03/2016 0817   CREATININE 2.06 (H) 06/14/2016 0856   CREATININE 0.9 08/02/2012 0839   CALCIUM 8.9 10/03/2016 0817   CALCIUM 9.0 10/26/2015   CALCIUM 8.7 08/02/2012 0839   GFRNONAA 41 (L) 11/23/2015 0400   GFRAA 47 (L) 11/23/2015 0400    Lipid Panel     Component Value Date/Time   CHOL 113 10/03/2016 0817   TRIG 318.0 (H) 10/03/2016 0817   HDL 26.40 (L) 10/03/2016 0817   CHOLHDL 4 10/03/2016 0817   VLDL 63.6 (H) 10/03/2016 0817   LDLCALC 116 (H) 09/07/2015 1000    CBC    Component Value Date/Time   WBC 5.0 10/03/2016 0817   RBC 3.94 (L) 10/03/2016 0817   HGB 11.0 (L) 10/03/2016 0817   HGB 10.0 10/26/2015   HCT 32.9 (L) 10/03/2016 0817   HCT 40.3 08/02/2012 0839   PLT 128.0 (L) 10/03/2016 0817   PLT 58 (L) 08/02/2012 0839   MCV 83.7 10/03/2016 0817   MCV 92.3 08/02/2012 0839   MCH  26.1 (L) 06/14/2016 0856   MCHC 33.5  10/03/2016 0817   RDW 17.4 (H) 10/03/2016 0817   RDW 13.2 08/02/2012 0839   LYMPHSABS 1.1 10/03/2016 0817   LYMPHSABS 1.0 08/02/2012 0839   MONOABS 0.4 10/03/2016 0817   MONOABS 0.3 08/02/2012 0839   EOSABS 0.1 10/03/2016 0817   EOSABS 0.1 08/02/2012 0839   BASOSABS 0.0 10/03/2016 0817   BASOSABS 0.0 08/02/2012 0839    Hgb A1C Lab Results  Component Value Date   HGBA1C 5.8 10/03/2016            Assessment & Plan:   Contact Dermatitis:  eRx for Triamcinolone Cream BID until resolved  Purpura:  It has not enlarged or spread Offered to check labs (given his history of immunosuppression) CBC with diff, CMET and PTT- he wants to hold off for now  Advised him to follow up with PCP in 1 week if rash does not improve BAITY, REGINA, NP

## 2017-02-08 ENCOUNTER — Ambulatory Visit (INDEPENDENT_AMBULATORY_CARE_PROVIDER_SITE_OTHER): Payer: BLUE CROSS/BLUE SHIELD | Admitting: Family Medicine

## 2017-02-08 ENCOUNTER — Telehealth: Payer: Self-pay | Admitting: Family Medicine

## 2017-02-08 ENCOUNTER — Encounter: Payer: Self-pay | Admitting: Family Medicine

## 2017-02-08 VITALS — BP 110/66 | HR 86 | Temp 98.4°F | Ht 67.0 in | Wt 289.5 lb

## 2017-02-08 DIAGNOSIS — D696 Thrombocytopenia, unspecified: Secondary | ICD-10-CM | POA: Diagnosis not present

## 2017-02-08 DIAGNOSIS — R21 Rash and other nonspecific skin eruption: Secondary | ICD-10-CM | POA: Diagnosis not present

## 2017-02-08 DIAGNOSIS — I10 Essential (primary) hypertension: Secondary | ICD-10-CM | POA: Diagnosis not present

## 2017-02-08 LAB — CBC WITH DIFFERENTIAL/PLATELET
BASOS ABS: 0 10*3/uL (ref 0.0–0.1)
BASOS PCT: 0.5 % (ref 0.0–3.0)
EOS ABS: 0.1 10*3/uL (ref 0.0–0.7)
EOS PCT: 1.7 % (ref 0.0–5.0)
HCT: 32.6 % — ABNORMAL LOW (ref 39.0–52.0)
Hemoglobin: 10.7 g/dL — ABNORMAL LOW (ref 13.0–17.0)
LYMPHS PCT: 15 % (ref 12.0–46.0)
Lymphs Abs: 0.9 10*3/uL (ref 0.7–4.0)
MCHC: 32.7 g/dL (ref 30.0–36.0)
MCV: 85.5 fl (ref 78.0–100.0)
MONO ABS: 0.4 10*3/uL (ref 0.1–1.0)
Monocytes Relative: 6.3 % (ref 3.0–12.0)
NEUTROS PCT: 76.5 % (ref 43.0–77.0)
Neutro Abs: 4.7 10*3/uL (ref 1.4–7.7)
PLATELETS: 127 10*3/uL — AB (ref 150.0–400.0)
RBC: 3.82 Mil/uL — AB (ref 4.22–5.81)
RDW: 17 % — ABNORMAL HIGH (ref 11.5–15.5)
WBC: 6.1 10*3/uL (ref 4.0–10.5)

## 2017-02-08 LAB — COMPREHENSIVE METABOLIC PANEL
ALT: 12 U/L (ref 0–53)
AST: 10 U/L (ref 0–37)
Albumin: 3.7 g/dL (ref 3.5–5.2)
Alkaline Phosphatase: 72 U/L (ref 39–117)
BUN: 18 mg/dL (ref 6–23)
CO2: 29 mEq/L (ref 19–32)
Calcium: 8.8 mg/dL (ref 8.4–10.5)
Chloride: 105 mEq/L (ref 96–112)
Creatinine, Ser: 1.52 mg/dL — ABNORMAL HIGH (ref 0.40–1.50)
GFR: 51.77 mL/min — ABNORMAL LOW (ref >60.00)
Glucose, Bld: 128 mg/dL — ABNORMAL HIGH (ref 70–99)
Potassium: 4.4 mEq/L (ref 3.5–5.1)
Sodium: 139 mEq/L (ref 135–145)
Total Bilirubin: 0.6 mg/dL (ref 0.2–1.2)
Total Protein: 6.6 g/dL (ref 6.0–8.3)

## 2017-02-08 MED ORDER — KETOCONAZOLE 2 % EX CREA: 1.0000 "application " | TOPICAL_CREAM | Freq: Every day | CUTANEOUS | 0 refills | Status: DC

## 2017-02-08 NOTE — Patient Instructions (Signed)
Labs today  Stop the trimcinolone cream  Keep the rash areas clean ,dry , and cool as much as you can  Use the ketoconazole cream as directed daily   (this is for a fungal rash)   If symptoms worsen let us know asap  Update [name] Friday with how you are doing by phone   Keep your appt with Dr Darnell Level on Monday

## 2017-02-08 NOTE — Telephone Encounter (Signed)
[facility]  Patient Name: Colton Porter  DOB: 07-May-1966    Initial Comment callers husband was prescribed cream for dermatitis issue on his right leg and toes on the left foot . States its spreading and getting worse. He is a liver transplant patient . When he was took off bactrium is when all this started happening . States it painful , swollen and looks like a rash    Nurse Assessment  Nurse: Markus Daft, RN, Sherre Poot Date/Time (Eastern Time): 02/08/2017 9:15:01 AM  Confirm and document reason for call. If symptomatic, describe symptoms. ---Callers husband was prescribed cream for dermatitis issue on his right leg (towards the ankle, but moving up the calf), and also on toes on the left foot and spreading there as well. Seen on Monday by MD. -- He is a liver transplant patient 01/04/2016. When he was took off bactrim is when all this started happening. He was taking it to protect the liver. He c/o that area is red, painful, swollen and looks like a rash.  Does the patient have any new or worsening symptoms? ---Yes  Will a triage be completed? ---Yes  Related visit to physician within the last 2 weeks? ---Yes  Does the PT have any chronic conditions? (i.e. diabetes, asthma, etc.) ---Yes  List chronic conditions. ---Liver Transplant pt; h/o pneumonia - not currently; Kidney failure  Is this a behavioral health or substance abuse call? ---No     Guidelines    Guideline Title Affirmed Question Affirmed Notes  Wound Infection [1] Skin around the wound has become red AND [2] larger than 2 inches (5 cm)    Final Disposition User   See Physician within 4 Hours (or PCP triage) Markus Daft, RN, [place]    Comments  Dr. Ria Bush and [name] NP do not have any available appts today. Appt made with Dr. Glori Bickers at 2 pm (by their office staff).   Referrals  REFERRED TO PCP OFFICE   Disagree/Comply: Comply

## 2017-02-08 NOTE — Telephone Encounter (Signed)
Pt has appt 02/08/17 at 2 pm with Dr Glori Bickers.

## 2017-02-08 NOTE — Progress Notes (Signed)
Subjective:    Patient ID: Colton Porter, male    DOB: 01/13/1966, 51 y.o.   MRN: [id number]  HPI Here for rash he was seen for by NP [facility] on 8/13 - tx with steroid cream  (triamcinolone) He had rash on R ankle and L foot and purpura on L foot also    He has hx of liver transplant  Had ttp in 2013  BP: 110/66  Pulse Rate: 86  Temp: 98.4 F (36.9 C)   Wt Readings from Last 3 Encounters:  02/08/17 289 lb 8 oz (131.3 kg)  02/06/17 287 lb (130.2 kg)  01/19/17 290 lb (131.5 kg)   Worse since he was seen  Sometimes it burns   Now it has moved to the top of his L foot and also anterior leg  It itches and occ burns  Worse when warm   He is on prograf  He was on bactrim for a year for prophylaxis and recently stopped it (2 wk ago)    He has done great since the transplant   He did go to the beach for a week (got home Saturday)-- but symptoms started before he went to the beach  Did not go out much  [place]ed [place] the shore [place] a pool brief   Never had athlete's foot or fungal problems   No known exposure to poison ivy but cannot rule it out  He always wears long pants and shoes with mowing  Uses sun block   Patient Active Problem List   Diagnosis Date Noted  . Rash and nonspecific skin eruption 02/08/2017  . Low back pain 01/19/2017  . Dysuria 01/19/2017  . Dyspnea 11/18/2016  . BPH (benign prostatic hyperplasia) 10/05/2016  . Status post liver transplant ([place]) 03/15/2016  . Portal hypertensive gastropathy ([place]) 12/04/2015  . Prediabetes 11/21/2015  . Gynecomastia   . CKD (chronic kidney disease) stage 3, GFR 30-59 ml/min 11/04/2015  . Depression, major, single episode, moderate ([place]) 07/09/2015  . Headache 02/19/2015  . Gassiness 01/08/2015  . Insomnia 12/06/2014  . Hepatic encephalopathy ([place]) 11/27/2014  . Hypomagnesemia   . Other fatigue 11/18/2014  . Internal hemorrhoid 10/01/2014  . Erectile dysfunction 10/01/2014  . Alcoholic cirrhosis of liver without  ascites ([place]) 07/28/2014  . (HFpEF) heart failure with preserved ejection fraction ([place]) 11/01/2013  . Essential hypertension 06/04/2013  . OSA on CPAP 07/11/2012  . History of alcohol dependence ([place]) 07/11/2012  . Thrombocytopenia ([place]) 12/15/2011  . Morbid obesity with BMI of 40.0-44.9, adult ([place]) 11/16/2011  . Routine general medical examination at a health care facility 09/22/2010  . NEUROPATHY 05/27/2009  . Pedal edema 09/22/2008  . Dyslipidemia 02/15/2007  . Allergic rhinitis 02/15/2007  . GERD 02/15/2007   Past Medical History:  Diagnosis Date  . Alcohol dependence ([place]) 07/11/2012  . Alcoholic cirrhosis of liver with ascites ([place]) 07/2014   s./p transplant 12/2015  . Allergy   . Anemia   . Cellulitis of left leg   . Chronic diastolic heart failure ([place]) 11/01/2013  . CKD (chronic kidney disease) stage 3, GFR 30-59 ml/min 11/04/2015  . GERD (gastroesophageal reflux disease)   . Hyperlipidemia   . Hypertension   . Neuropathy   . OSA on CPAP 07/11/2012   HST 07/2013:  AHI 39/hr.    . Thrombocytopenia ([place]) 12/15/2011   Past Surgical History:  Procedure Laterality Date  . COLONOSCOPY  08/2013   hyperplastic polyps, hemorrhoids Ardis Hughs)  . ESOPHAGOGASTRODUODENOSCOPY  09/2014  portal gastropathy without varices Ardis Hughs)  . HAND SURGERY  1997  . LEFT AND RIGHT HEART CATHETERIZATION WITH CORONARY ANGIOGRAM N/A 06/10/2013   Procedure: LEFT AND RIGHT HEART CATHETERIZATION WITH CORONARY ANGIOGRAM;  Surgeon: Blane Ohara, MD;  Location: [facility] CATH LAB;  Service: Cardiovascular;  Laterality: N/A;  . LIVER TRANSPLANTATION  78/2956   alcoholic cirrhosis ([facility])   Social History  Substance Use Topics  . Smoking status: Never Smoker  . Smokeless tobacco: Former Systems developer    Types: Chew  . Alcohol use No     Comment: 4-6 drinks daily - NO ETOH SINCE APRIL 2016   Family History  Problem Relation Age of Onset  . Stroke Mother   . Emphysema Mother   . Hypertension Father     . Heart disease Father   . Emphysema Father   . Lung cancer Maternal Aunt   . Stroke Paternal Grandmother   . Heart attack Neg Hx    Allergies  Allergen Reactions  . Nsaids Other (See Comments)    cirrhosis  . Ambien [Zolpidem Tartrate] Other (See Comments)    Over-toxicity from liver failure  . Morphine And Related     Hallucinations.  . Hydrochlorothiazide W-Triamterene Other (See Comments)    REACTION: dizzy, nausea  . Lisinopril Other (See Comments)    REACTION: cough, decreased libido   Current Outpatient Prescriptions on File Prior to Visit  Medication Sig Dispense Refill  . acetaminophen (TYLENOL) 500 MG tablet Take 500 mg by mouth every 6 (six) hours as needed for mild pain.    Marland Kitchen amLODipine (NORVASC) 10 MG tablet Take 1 tablet (10 mg total) by mouth daily. 90 tablet 3  . ASPIR-LOW 81 MG EC tablet Take 81 mg by mouth daily.  11  . cetirizine (ZYRTEC) 10 MG tablet Take 10 mg by mouth daily.     . cholecalciferol (VITAMIN D) 1000 units tablet Take 1,000 Units by mouth daily.    Marland Kitchen doxazosin (CARDURA) 8 MG tablet Take 8 mg by mouth at bedtime.   6  . escitalopram (LEXAPRO) 10 MG tablet TAKE 1 TABLET BY MOUTH EVERY DAY 30 tablet 6  . furosemide (LASIX) 20 MG tablet Take 40 mg by mouth daily.  3  . losartan (COZAAR) 50 MG tablet Take 50 mg by mouth daily.   0  . Multiple Vitamin (MULTIVITAMIN WITH MINERALS) TABS tablet Take 2 tablets by mouth every evening.    . tacrolimus (PROGRAF) 1 MG capsule Take 2 mg by mouth 2 (two) times daily.     . tamsulosin (FLOMAX) 0.4 MG CAPS capsule Take 0.8 mg by mouth daily.     Marland Kitchen triamcinolone cream (KENALOG) 0.1 % Apply 1 application topically 2 (two) times daily. 30 g 0   No current facility-administered medications on file prior to visit.     Review of Systems Review of Systems  Constitutional: Negative for fever, appetite change, fatigue and unexpected weight change.  Eyes: Negative for pain and visual disturbance.  Respiratory:  Negative for cough and shortness of breath.   Cardiovascular: Negative for cp or palpitations    Gastrointestinal: Negative for nausea, diarrhea and constipation.  Genitourinary: Negative for urgency and frequency.  Skin: Negative for pallor and pos for rash (that itches and burns) Neurological: Negative for weakness, light-headedness, numbness and headaches.  Hematological: Negative for adenopathy. Does not bruise/bleed easily.  Psychiatric/Behavioral: Negative for dysphoric mood. The patient is not nervous/anxious.         Objective:  Physical Exam  Constitutional: He appears well-developed and well-nourished. No distress.  Obese and well appearing   HENT:  Head: Normocephalic and atraumatic.  Mouth/Throat: Oropharynx is clear and moist.  Eyes: Pupils are equal, round, and reactive to light. Conjunctivae and EOM are normal. Right eye exhibits no discharge. Left eye exhibits no discharge. No scleral icterus.  Neck: Normal range of motion. Neck supple.  Cardiovascular: Normal rate and regular rhythm.   Pulmonary/Chest: Effort normal and breath sounds normal.  Musculoskeletal: He exhibits no edema.  Lymphadenopathy:    He has no cervical adenopathy.  Neurological: He is alert.  Skin: Skin is warm and dry. Rash noted.  Scattered areas of rash on R ankle and L foot/ant toes and ant shin area  Raised / red/ sharp scalloped margins with slt central clearing in some areas  Bumpy in texture (without vesicle or plaque formation) No excoriation  No pustures  No warmth   Feet are dry with scale but no cracking Very slight maceration between 4th and 5th toes    Psychiatric: He has a normal mood and affect.  Cheerful and talkative           Assessment & Plan:   Problem List Items Addressed This Visit      Cardiovascular and Mediastinum   Essential hypertension - Primary    bp in fair control at this time  BP Readings from Last 1 Encounters:  02/08/17 110/66   No changes  needed Disc lifstyle change with low sodium diet and exercise  Lab today      Relevant Orders   CBC with Differential/Platelet (Completed)   Comprehensive metabolic panel (Completed)     Musculoskeletal and Integument   Rash and nonspecific skin eruption    Raised erythematous rash on R ankle /L foot and ant L shin Has scalloped and sharp borders and bumpy texture with slt internal clearing in some areas = resembling fungal infection  Px ketoconazole cream 2% to use daily  Urged to keep area very clean and dry    If no improvement in several days alert [name]  He is immunocompromised after liver transplant - watching closely for worsening or any signs of cellulitis F/u with pcp Monday as planned (earlier if needed)  Lab today incl cmet and cbc          Other   Thrombocytopenia ([place])    In a liver transplant pt with rash on LEs  Cbc today       Relevant Orders   CBC with Differential/Platelet (Completed)

## 2017-02-09 ENCOUNTER — Other Ambulatory Visit: Payer: Self-pay | Admitting: Cardiology

## 2017-02-09 NOTE — Assessment & Plan Note (Addendum)
Raised erythematous rash on R ankle /L foot and ant L shin Has scalloped and sharp borders and bumpy texture with slt internal clearing in some areas = resembling fungal infection  Px ketoconazole cream 2% to use daily  Urged to keep area very clean and dry    If no improvement in several days alert [name]  He is immunocompromised after liver transplant - watching closely for worsening or any signs of cellulitis F/u with pcp Monday as planned (earlier if needed)  Lab today incl cmet and cbc

## 2017-02-09 NOTE — Assessment & Plan Note (Signed)
bp in fair control at this time  BP Readings from Last 1 Encounters:  02/08/17 110/66   No changes needed Disc lifstyle change with low sodium diet and exercise  Lab today

## 2017-02-09 NOTE — Assessment & Plan Note (Signed)
In a liver transplant pt with rash on LEs  Cbc today

## 2017-02-10 ENCOUNTER — Telehealth: Payer: Self-pay

## 2017-02-10 NOTE — Telephone Encounter (Signed)
Thank you :)

## 2017-02-10 NOTE — Telephone Encounter (Signed)
Great! So glad to hear it  Will cc to pcp

## 2017-02-10 NOTE — Telephone Encounter (Signed)
Pt left v/m; pt was seen 02/08/17. rash is better; pt thinks everything is going to be fine and pt has f/u with Dr Darnell Level on 02/13/17. FYI to Dr Glori Bickers.

## 2017-02-12 ENCOUNTER — Other Ambulatory Visit: Payer: Self-pay | Admitting: Cardiology

## 2017-02-13 ENCOUNTER — Ambulatory Visit: Payer: BLUE CROSS/BLUE SHIELD | Admitting: Family Medicine

## 2017-02-13 NOTE — Telephone Encounter (Signed)
Medication Detail    Disp Refills Start End   amLODipine (NORVASC) 10 MG tablet 90 tablet 3 12/22/2016    Sig - Route: Take 1 tablet (10 mg total) by mouth daily. - Oral   Sent to pharmacy as: amLODipine (NORVASC) 10 MG tablet   E-Prescribing Status: Receipt confirmed by pharmacy (12/22/2016 9:11 AM EDT)   Pharmacy   [id number] - [place], [place] - [address]

## 2017-02-20 ENCOUNTER — Encounter: Payer: Self-pay | Admitting: Family Medicine

## 2017-02-20 ENCOUNTER — Ambulatory Visit (INDEPENDENT_AMBULATORY_CARE_PROVIDER_SITE_OTHER): Payer: BLUE CROSS/BLUE SHIELD | Admitting: Family Medicine

## 2017-02-20 VITALS — BP 118/62 | HR 83 | Temp 98.3°F | Wt 288.5 lb

## 2017-02-20 DIAGNOSIS — S80812A Abrasion, left lower leg, initial encounter: Secondary | ICD-10-CM | POA: Diagnosis not present

## 2017-02-20 NOTE — Progress Notes (Signed)
BP 118/62   Pulse 83   Temp 98.3 F (36.8 C) (Oral)   Wt 288 lb 8 oz (130.9 kg)   SpO2 95%   BMI 45.19 kg/m    CC: leg laceration from rosebush Subjective:    Patient ID: Colton Porter, male    DOB: Jan 24, 1966, 51 y.o.   MRN: [id number]  HPI: DAYVION SANS is a 51 y.o. male presenting on 02/20/2017 for Laceration (from a rose bush)   2d ago while mowing at home he got scraped by [place]. Noticed mild bleeding but several hours later noticed leg weeping from same spot. Treated with bandage. No streaking redness or fevers.   Rash seen earlier in the month - significantly improved with ketoconazole treatment.  Relevant past medical, surgical, family and social history reviewed and updated as indicated. Interim medical history since our last visit reviewed. Allergies and medications reviewed and updated. Outpatient Medications Prior to Visit  Medication Sig Dispense Refill  . acetaminophen (TYLENOL) 500 MG tablet Take 500 mg by mouth every 6 (six) hours as needed for mild pain.    Marland Kitchen amLODipine (NORVASC) 10 MG tablet Take 1 tablet (10 mg total) by mouth daily. 90 tablet 3  . ASPIR-LOW 81 MG EC tablet Take 81 mg by mouth daily.  11  . cetirizine (ZYRTEC) 10 MG tablet Take 10 mg by mouth daily.     . cholecalciferol (VITAMIN D) 1000 units tablet Take 1,000 Units by mouth daily.    Marland Kitchen doxazosin (CARDURA) 8 MG tablet Take 8 mg by mouth at bedtime.   6  . escitalopram (LEXAPRO) 10 MG tablet TAKE 1 TABLET BY MOUTH EVERY DAY 30 tablet 6  . furosemide (LASIX) 20 MG tablet Take 40 mg by mouth daily.  3  . ketoconazole (NIZORAL) 2 % cream Apply 1 application topically daily. 30 g 0  . losartan (COZAAR) 50 MG tablet Take 50 mg by mouth daily.   0  . Multiple Vitamin (MULTIVITAMIN WITH MINERALS) TABS tablet Take 2 tablets by mouth every evening.    . tacrolimus (PROGRAF) 1 MG capsule Take 2 mg by mouth 2 (two) times daily.     . tamsulosin (FLOMAX) 0.4 MG CAPS capsule Take 0.8 mg by mouth  daily.     Marland Kitchen triamcinolone cream (KENALOG) 0.1 % Apply 1 application topically 2 (two) times daily. 30 g 0   No facility-administered medications prior to visit.      Per HPI unless specifically indicated in ROS section below Review of Systems     Objective:    BP 118/62   Pulse 83   Temp 98.3 F (36.8 C) (Oral)   Wt 288 lb 8 oz (130.9 kg)   SpO2 95%   BMI 45.19 kg/m   Wt Readings from Last 3 Encounters:  02/20/17 288 lb 8 oz (130.9 kg)  02/08/17 289 lb 8 oz (131.3 kg)  02/06/17 287 lb (130.2 kg)    Physical Exam  Constitutional: He appears well-developed and well-nourished. No distress.  Musculoskeletal: He exhibits edema (chronic 1+ pedal).  Skin: Skin is warm and dry. No rash noted. No erythema.  Superficial small cut anterior left leg without surrounding erythema, some weeping present  Nursing note and vitals reviewed.  Results for orders placed or performed in visit on 02/08/17  CBC with Differential/Platelet  Result Value Ref Range   WBC 6.1 4.0 - 10.5 K/uL   RBC 3.82 (L) 4.22 - 5.81 Mil/uL   Hemoglobin 10.7 (L) 13.0 -  17.0 g/dL   HCT 32.6 (L) 39.0 - 52.0 %   MCV 85.5 78.0 - 100.0 fl   MCHC 32.7 30.0 - 36.0 g/dL   RDW 17.0 (H) 11.5 - 15.5 %   Platelets 127.0 (L) 150.0 - 400.0 K/uL   Neutrophils Relative % 76.5 43.0 - 77.0 %   Lymphocytes Relative 15.0 12.0 - 46.0 %   Monocytes Relative 6.3 3.0 - 12.0 %   Eosinophils Relative 1.7 0.0 - 5.0 %   Basophils Relative 0.5 0.0 - 3.0 %   Neutro Abs 4.7 1.4 - 7.7 K/uL   Lymphs Abs 0.9 0.7 - 4.0 K/uL   Monocytes Absolute 0.4 0.1 - 1.0 K/uL   Eosinophils Absolute 0.1 0.0 - 0.7 K/uL   Basophils Absolute 0.0 0.0 - 0.1 K/uL  Comprehensive metabolic panel  Result Value Ref Range   Sodium 139 135 - 145 mEq/L   Potassium 4.4 3.5 - 5.1 mEq/L   Chloride 105 96 - 112 mEq/L   CO2 29 19 - 32 mEq/L   Glucose, Bld 128 (H) 70 - 99 mg/dL   BUN 18 6 - 23 mg/dL   Creatinine, Ser 1.52 (H) 0.40 - 1.50 mg/dL   Total Bilirubin 0.6  0.2 - 1.2 mg/dL   Alkaline Phosphatase 72 39 - 117 U/L   AST 10 0 - 37 U/L   ALT 12 0 - 53 U/L   Total Protein 6.6 6.0 - 8.3 g/dL   Albumin 3.7 3.5 - 5.2 g/dL   Calcium 8.8 8.4 - 10.5 mg/dL   GFR 51.77 (L) >60.00 mL/min      Assessment & Plan:   Problem List Items Addressed This Visit    Abrasion of left leg, initial encounter - Primary    No infection present. Wound complicated by liver history and chronic pedal edema. Supportive care reviewed. Wound dressed with abx ointment and bandate. rec abx ointment and bandage until healed. He is planning on getting compression stockings to use - this should also help.           Follow up plan: No Follow-up on file.  Ria Bush, MD

## 2017-02-20 NOTE — Assessment & Plan Note (Addendum)
No infection present. Wound complicated by liver history and chronic pedal edema. Supportive care reviewed. Wound dressed with abx ointment and bandate. rec abx ointment and bandage until healed. He is planning on getting compression stockings to use - this should also help.

## 2017-02-28 ENCOUNTER — Other Ambulatory Visit: Payer: Self-pay | Admitting: *Deleted

## 2017-02-28 MED ORDER — AMLODIPINE BESYLATE 10 MG PO TABS: 10.0000 mg | ORAL_TABLET | Freq: Every day | ORAL | 3 refills | Status: DC

## 2017-03-02 DIAGNOSIS — B259 Cytomegaloviral disease, unspecified: Secondary | ICD-10-CM | POA: Diagnosis not present

## 2017-03-02 DIAGNOSIS — Z4823 Encounter for aftercare following liver transplant: Secondary | ICD-10-CM | POA: Diagnosis not present

## 2017-03-02 DIAGNOSIS — Z944 Liver transplant status: Secondary | ICD-10-CM | POA: Diagnosis not present

## 2017-03-02 LAB — HEPATIC FUNCTION PANEL
ALT: 10 (ref 10–40)
AST: 11 — AB (ref 14–40)
Alkaline Phosphatase: 85 (ref 25–125)
Bilirubin, Total: 0.4

## 2017-03-02 LAB — BASIC METABOLIC PANEL
BUN: 15 (ref 4–21)
CREATININE: 1.4 — AB (ref 0.6–1.3)
GLUCOSE: 138
POTASSIUM: 4.2 (ref 3.4–5.3)
Sodium: 142 (ref 137–147)

## 2017-03-02 LAB — CBC AND DIFFERENTIAL
Hemoglobin: 10.4 — AB (ref 13.5–17.5)
Platelets: 131 — AB (ref 150–399)
WBC: 5.2

## 2017-03-03 ENCOUNTER — Telehealth: Payer: Self-pay

## 2017-03-03 NOTE — Telephone Encounter (Signed)
[employer] left v/m that pt wanted escitalopram to be transferred to [facility]; I spoke with pt and he spoke with his wife and she is going to ck with ins and then call back and let me know whether to [facility] to [facility]

## 2017-04-03 DIAGNOSIS — N183 Chronic kidney disease, stage 3 (moderate): Secondary | ICD-10-CM | POA: Diagnosis not present

## 2017-04-06 ENCOUNTER — Ambulatory Visit (INDEPENDENT_AMBULATORY_CARE_PROVIDER_SITE_OTHER): Payer: Medicare HMO | Admitting: Family Medicine

## 2017-04-06 ENCOUNTER — Encounter: Payer: Self-pay | Admitting: Family Medicine

## 2017-04-06 VITALS — BP 120/70 | HR 80 | Temp 98.0°F | Wt 292.0 lb

## 2017-04-06 DIAGNOSIS — I5030 Unspecified diastolic (congestive) heart failure: Secondary | ICD-10-CM

## 2017-04-06 DIAGNOSIS — I10 Essential (primary) hypertension: Secondary | ICD-10-CM | POA: Diagnosis not present

## 2017-04-06 DIAGNOSIS — Z944 Liver transplant status: Secondary | ICD-10-CM | POA: Diagnosis not present

## 2017-04-06 DIAGNOSIS — Z23 Encounter for immunization: Secondary | ICD-10-CM | POA: Diagnosis not present

## 2017-04-06 DIAGNOSIS — N183 Chronic kidney disease, stage 3 unspecified: Secondary | ICD-10-CM

## 2017-04-06 DIAGNOSIS — N5319 Other ejaculatory dysfunction: Secondary | ICD-10-CM

## 2017-04-06 DIAGNOSIS — Z6841 Body Mass Index (BMI) 40.0 and over, adult: Secondary | ICD-10-CM | POA: Diagnosis not present

## 2017-04-06 DIAGNOSIS — F321 Major depressive disorder, single episode, moderate: Secondary | ICD-10-CM

## 2017-04-06 MED ORDER — LOSARTAN POTASSIUM 50 MG PO TABS: 50.0000 mg | ORAL_TABLET | Freq: Every day | ORAL | 3 refills | Status: DC

## 2017-04-06 MED ORDER — AMLODIPINE BESYLATE 10 MG PO TABS: 10.0000 mg | ORAL_TABLET | Freq: Every day | ORAL | 3 refills | Status: DC

## 2017-04-06 MED ORDER — TAMSULOSIN HCL 0.4 MG PO CAPS: 0.8000 mg | ORAL_CAPSULE | Freq: Every day | ORAL | 3 refills | Status: DC

## 2017-04-06 MED ORDER — ASPIR-LOW 81 MG PO TBEC: 81.0000 mg | DELAYED_RELEASE_TABLET | Freq: Every day | ORAL | 3 refills | Status: DC

## 2017-04-06 MED ORDER — DOXAZOSIN MESYLATE 8 MG PO TABS: 8.0000 mg | ORAL_TABLET | Freq: Every day | ORAL | 3 refills | Status: DC

## 2017-04-06 MED ORDER — OMEPRAZOLE 40 MG PO CPDR: 40.0000 mg | DELAYED_RELEASE_CAPSULE | Freq: Every day | ORAL | 3 refills | Status: DC

## 2017-04-06 MED ORDER — FUROSEMIDE 40 MG PO TABS
40.0000 mg | ORAL_TABLET | Freq: Every day | ORAL | 3 refills | Status: DC
Start: 2017-04-06 — End: 2018-06-11

## 2017-04-06 MED ORDER — ESCITALOPRAM OXALATE 10 MG PO TABS: 10.0000 mg | ORAL_TABLET | Freq: Every day | ORAL | 3 refills | Status: DC

## 2017-04-06 NOTE — Addendum Note (Signed)
Addended by: [name] on: 70/14/1030 12:18 PM   Modules accepted: Orders

## 2017-04-06 NOTE — Patient Instructions (Addendum)
Flu shot today.  If interested, check with pharmacy about new 2 shot shingles series (shingrix).  Medicines refilled. Increase walking routine.  I will await labs from kidney doctor and liver doctor.  Return as needed or in 6 months for physical

## 2017-04-06 NOTE — Assessment & Plan Note (Addendum)
Continue f/u with cards. Seems euvolemic today.

## 2017-04-06 NOTE — Assessment & Plan Note (Signed)
Appreciate nephrology care - planned f/u with Dr Posey Pronto later this month

## 2017-04-06 NOTE — Assessment & Plan Note (Signed)
Endorses dry ejaculation, ongoing for years (prior to lexapro). Will review at future visit.

## 2017-04-06 NOTE — Assessment & Plan Note (Signed)
Continue lexapro 10mg  daily.

## 2017-04-06 NOTE — Assessment & Plan Note (Signed)
Encouraged increased walking routine.

## 2017-04-06 NOTE — Assessment & Plan Note (Signed)
Chronic, stable. Continue current regimen. 

## 2017-04-06 NOTE — Progress Notes (Signed)
BP 120/70 (BP Location: Left Arm, Patient Position: Sitting, Cuff Size: Large)   Pulse 80   Temp 98 F (36.7 C) (Oral)   Wt 292 lb (132.5 kg)   SpO2 94%   BMI 45.73 kg/m    CC: 6 mo f/u visit Subjective:    Patient ID: Colton Porter, male    DOB: 10-12-1965, 51 y.o.   MRN: [id number]  HPI: Colton Porter is a 51 y.o. male presenting on 04/06/2017 for 6 mo follow-up (Pt has new insurance and pharmacy. Needs new 90 day rxs for all meds sent to [facility] order)   Requests meds refilled to [place].   S/p liver transplant 12/2015. Spacing out to every year. Liver clinic is asking PCP to manage other medications for now.  Sees cardiology for chronic diastolic CHF.  Saw nephrology Dr Posey Pronto 01/2017 - planned f/u later this month. Had lasb done Monday.   New compression stockings.   Relevant past medical, surgical, family and social history reviewed and updated as indicated. Interim medical history since our last visit reviewed. Allergies and medications reviewed and updated. Outpatient Medications Prior to Visit  Medication Sig Dispense Refill  . acetaminophen (TYLENOL) 500 MG tablet Take 500 mg by mouth every 6 (six) hours as needed for mild pain.    . Multiple Vitamin (MULTIVITAMIN WITH MINERALS) TABS tablet Take 2 tablets by mouth every evening.    . tacrolimus (PROGRAF) 1 MG capsule Take 2 mg by mouth 2 (two) times daily.     Marland Kitchen amLODipine (NORVASC) 10 MG tablet Take 1 tablet (10 mg total) by mouth daily. 90 tablet 3  . ASPIR-LOW 81 MG EC tablet Take 81 mg by mouth daily.  11  . cetirizine (ZYRTEC) 10 MG tablet Take 10 mg by mouth daily.     . cholecalciferol (VITAMIN D) 1000 units tablet Take 1,000 Units by mouth daily.    Marland Kitchen doxazosin (CARDURA) 8 MG tablet Take 8 mg by mouth at bedtime.   6  . escitalopram (LEXAPRO) 10 MG tablet TAKE 1 TABLET BY MOUTH EVERY DAY 30 tablet 6  . furosemide (LASIX) 20 MG tablet Take 40 mg by mouth daily.  3  . ketoconazole (NIZORAL) 2 %  cream Apply 1 application topically daily. 30 g 0  . losartan (COZAAR) 50 MG tablet Take 50 mg by mouth daily.   0  . tamsulosin (FLOMAX) 0.4 MG CAPS capsule Take 0.8 mg by mouth daily.     Marland Kitchen triamcinolone cream (KENALOG) 0.1 % Apply 1 application topically 2 (two) times daily. 30 g 0   No facility-administered medications prior to visit.      Per HPI unless specifically indicated in ROS section below Review of Systems     Objective:    BP 120/70 (BP Location: Left Arm, Patient Position: Sitting, Cuff Size: Large)   Pulse 80   Temp 98 F (36.7 C) (Oral)   Wt 292 lb (132.5 kg)   SpO2 94%   BMI 45.73 kg/m   Wt Readings from Last 3 Encounters:  04/06/17 292 lb (132.5 kg)  02/20/17 288 lb 8 oz (130.9 kg)  02/08/17 289 lb 8 oz (131.3 kg)    Physical Exam  Constitutional: He appears well-developed and well-nourished. No distress.  HENT:  Mouth/Throat: Oropharynx is clear and moist. No oropharyngeal exudate.  Eyes: Pupils are equal, round, and reactive to light. Conjunctivae and EOM are normal.  Cardiovascular: Normal rate, regular rhythm, normal heart sounds and intact  distal pulses.   No murmur heard. Pulmonary/Chest: Effort normal and breath sounds normal. No respiratory distress. He has no wheezes. He has no rales.  Musculoskeletal: He exhibits edema (nonpitting).  Compression stockings in place  Skin: Skin is warm and dry. No rash noted.  Psychiatric: He has a normal mood and affect.  Nursing note and vitals reviewed.  Results for orders placed or performed in visit on 02/08/17  CBC with Differential/Platelet  Result Value Ref Range   WBC 6.1 4.0 - 10.5 K/uL   RBC 3.82 (L) 4.22 - 5.81 Mil/uL   Hemoglobin 10.7 (L) 13.0 - 17.0 g/dL   HCT 32.6 (L) 39.0 - 52.0 %   MCV 85.5 78.0 - 100.0 fl   MCHC 32.7 30.0 - 36.0 g/dL   RDW 17.0 (H) 11.5 - 15.5 %   Platelets 127.0 (L) 150.0 - 400.0 K/uL   Neutrophils Relative % 76.5 43.0 - 77.0 %   Lymphocytes Relative 15.0 12.0 - 46.0 %    Monocytes Relative 6.3 3.0 - 12.0 %   Eosinophils Relative 1.7 0.0 - 5.0 %   Basophils Relative 0.5 0.0 - 3.0 %   Neutro Abs 4.7 1.4 - 7.7 K/uL   Lymphs Abs 0.9 0.7 - 4.0 K/uL   Monocytes Absolute 0.4 0.1 - 1.0 K/uL   Eosinophils Absolute 0.1 0.0 - 0.7 K/uL   Basophils Absolute 0.0 0.0 - 0.1 K/uL  Comprehensive metabolic panel  Result Value Ref Range   Sodium 139 135 - 145 mEq/L   Potassium 4.4 3.5 - 5.1 mEq/L   Chloride 105 96 - 112 mEq/L   CO2 29 19 - 32 mEq/L   Glucose, Bld 128 (H) 70 - 99 mg/dL   BUN 18 6 - 23 mg/dL   Creatinine, Ser 1.52 (H) 0.40 - 1.50 mg/dL   Total Bilirubin 0.6 0.2 - 1.2 mg/dL   Alkaline Phosphatase 72 39 - 117 U/L   AST 10 0 - 37 U/L   ALT 12 0 - 53 U/L   Total Protein 6.6 6.0 - 8.3 g/dL   Albumin 3.7 3.5 - 5.2 g/dL   Calcium 8.8 8.4 - 10.5 mg/dL   GFR 51.77 (L) >60.00 mL/min      Assessment & Plan:   Problem List Items Addressed This Visit    (HFpEF) heart failure with preserved ejection fraction ([place])    Continue f/u with cards. Seems euvolemic today.       Relevant Medications   doxazosin (CARDURA) 8 MG tablet   amLODipine (NORVASC) 10 MG tablet   furosemide (LASIX) 40 MG tablet   losartan (COZAAR) 50 MG tablet   ASPIR-LOW 81 MG EC tablet   CKD (chronic kidney disease) stage 3, GFR 30-59 ml/min ([place])    Appreciate nephrology care - planned f/u with Dr Posey Pronto later this month      Depression, major, single episode, moderate ([place])    Continue lexapro 10mg  daily.       Relevant Medications   escitalopram (LEXAPRO) 10 MG tablet   Disorder of ejaculation    Endorses dry ejaculation, ongoing for years (prior to lexapro). Will review at future visit.       Essential hypertension    Chronic, stable. Continue current regimen.       Relevant Medications   doxazosin (CARDURA) 8 MG tablet   amLODipine (NORVASC) 10 MG tablet   furosemide (LASIX) 40 MG tablet   losartan (COZAAR) 50 MG tablet   ASPIR-LOW 81 MG EC tablet  Morbid obesity  with BMI of 40.0-44.9, adult ([place])    Encouraged increased walking routine.       Status post liver transplant [facility]) - Primary    Doing well - has been cleared to return yearly to liver center They will continue to manage prograf          Follow up plan: Return in about 6 months (around 10/05/2017) for annual exam, prior fasting for blood work.  Ria Bush, MD

## 2017-04-06 NOTE — Assessment & Plan Note (Addendum)
Doing well - has been cleared to return yearly to liver center They will continue to manage prograf

## 2017-04-12 ENCOUNTER — Encounter: Payer: Self-pay | Admitting: Sports Medicine

## 2017-04-12 ENCOUNTER — Ambulatory Visit (INDEPENDENT_AMBULATORY_CARE_PROVIDER_SITE_OTHER): Payer: Medicare HMO | Admitting: Sports Medicine

## 2017-04-12 ENCOUNTER — Ambulatory Visit (INDEPENDENT_AMBULATORY_CARE_PROVIDER_SITE_OTHER): Payer: Medicare HMO

## 2017-04-12 VITALS — BP 128/68 | HR 90 | Ht 66.0 in | Wt 286.4 lb

## 2017-04-12 DIAGNOSIS — N183 Chronic kidney disease, stage 3 unspecified: Secondary | ICD-10-CM

## 2017-04-12 DIAGNOSIS — M79671 Pain in right foot: Secondary | ICD-10-CM

## 2017-04-12 DIAGNOSIS — M7989 Other specified soft tissue disorders: Secondary | ICD-10-CM | POA: Insufficient documentation

## 2017-04-12 DIAGNOSIS — Z944 Liver transplant status: Secondary | ICD-10-CM | POA: Diagnosis not present

## 2017-04-12 MED ORDER — METHYLPREDNISOLONE ACETATE 80 MG/ML IJ SUSP
80.0000 mg | Freq: Once | INTRAMUSCULAR | Status: AC
  Administered 2017-04-12: 80 mg via INTRAMUSCULAR

## 2017-04-12 MED ORDER — COLCHICINE 0.6 MG PO CAPS: 1.0000 | ORAL_CAPSULE | Freq: Every day | ORAL | 0 refills | Status: DC

## 2017-04-12 MED ORDER — KETOROLAC TROMETHAMINE 60 MG/2ML IM SOLN
60.0000 mg | Freq: Once | INTRAMUSCULAR | Status: AC
  Administered 2017-04-12: 30 mg via INTRAMUSCULAR

## 2017-04-12 MED ORDER — METHYLPREDNISOLONE 4 MG PO TBPK: ORAL_TABLET | ORAL | 0 refills | Status: DC

## 2017-04-12 NOTE — Progress Notes (Signed)
OFFICE VISIT NOTE Colton Porter, [facility]  Colton Porter - 51 y.o. male MRN [id number]  Date of birth: 1966-02-28  Visit Date: 04/12/2017  PCP: Ria Bush, MD   Referred by: Ria Bush, MD  Thalia Bloodgood PT, LAT ATC acting as scribe for Dr. Paulla Fore.  SUBJECTIVE:   Chief Complaint  Patient presents with  . New Patient (Initial Visit)    R foot pain   HPI: As below and per problem based documentation when appropriate.  Colton Porter is a new pt presenting today w/ c/o R foot pain.  He states that his R foot pain started a couple days ago and got much worse yesterday to the point that he wasn't able to sleep.  Pt is currently in a W/C as weight bearing is painful and more irritating to his symptoms.  Pt states that he has been experiencing intermittent foot pain alternating between the R and L foot since his liver transplant about a year ago.  He states that the pain began along the R lateral foot and is now involving the entire R foot.  He recalls no specific MOI.  Pt states the R foot pain is a sharp, throbbing pain and rates it as a 10/10.  He reports having taken 2 Tylenol around 1 am this morning.  He notes both pain and N/T in his R foot.  He also notes some shooting pain into his R lower leg/calf.  He states that he feels the majority of his pain at  the MTP joints in his R foot.     Review of Systems  Constitutional: Negative for chills, fever and weight loss.  HENT: Negative.   Eyes: Negative.   Respiratory: Positive for shortness of breath. Negative for cough and wheezing.   Cardiovascular: Negative for chest pain.  Gastrointestinal: Negative for abdominal pain, heartburn and nausea.  Genitourinary: Negative.   Musculoskeletal: Positive for joint pain. Negative for falls.  Neurological: Positive for tingling. Negative for dizziness and headaches.  Endo/Heme/Allergies: Bruises/bleeds  easily.  Psychiatric/Behavioral: Negative for depression. The patient is not nervous/anxious and does not have insomnia.     Otherwise per HPI.  HISTORY & PERTINENT PRIOR DATA:  No specialty comments available. He reports that he has never smoked. He has quit using smokeless tobacco. His smokeless tobacco use included Chew.   Recent Labs  05/17/16 0954 10/03/16 0817  HGBA1C 6.1 5.8   Allergies reviewed per EMR Prior to Admission medications   Medication Sig Start Date End Date Taking? Authorizing Provider  acetaminophen (TYLENOL) 500 MG tablet Take 500 mg by mouth every 6 (six) hours as needed for mild pain.   Yes [provider]  amLODipine (NORVASC) 10 MG tablet Take 1 tablet (10 mg total) by mouth daily. 04/06/17  Yes Ria Bush, MD  ASPIR-LOW 81 MG EC tablet Take 1 tablet (81 mg total) by mouth daily. 04/06/17  Yes Ria Bush, MD  Cholecalciferol (VITAMIN D3) 2000 units TABS Take 1 tablet by mouth at bedtime.   Yes [provider]  docusate sodium (COLACE) 100 MG capsule Take 100 mg by mouth 2 (two) times daily as needed for mild constipation.   Yes [provider]  doxazosin (CARDURA) 8 MG tablet Take 1 tablet (8 mg total) by mouth at bedtime. 04/06/17  Yes Ria Bush, MD  escitalopram (LEXAPRO) 10 MG tablet Take 1 tablet (10 mg total) by mouth daily. 04/06/17  Yes Ria Bush, MD  furosemide (LASIX) 40 MG tablet Take 1 tablet (40 mg total) by mouth daily. 04/06/17  Yes Ria Bush, MD  loratadine (CLARITIN) 10 MG tablet Take 10 mg by mouth daily.   Yes [provider]  losartan (COZAAR) 50 MG tablet Take 1 tablet (50 mg total) by mouth daily. 04/06/17  Yes Ria Bush, MD  Multiple Vitamin (MULTIVITAMIN WITH MINERALS) TABS tablet Take 2 tablets by mouth every evening.   Yes [provider]  Omega-3 Fatty Acids (FISH OIL PO) Take 1 tablet by mouth at bedtime.   Yes [provider]  omeprazole  (PRILOSEC) 40 MG capsule Take 1 capsule (40 mg total) by mouth daily. 04/06/17  Yes Ria Bush, MD  tacrolimus (PROGRAF) 1 MG capsule Take 2 mg by mouth 2 (two) times daily.    Yes [provider]  tamsulosin (FLOMAX) 0.4 MG CAPS capsule Take 2 capsules (0.8 mg total) by mouth daily. 04/06/17  Yes Ria Bush, MD  Colchicine (MITIGARE) 0.6 MG CAPS Take 1 capsule by mouth daily. 04/12/17   Gerda Diss, DO  methylPREDNISolone (MEDROL DOSEPAK) 4 MG TBPK tablet Take by mouth as directed. Take 6 tablets on the first day prescribed then as directed. 04/12/17   Gerda Diss, DO   Patient Active Problem List   Diagnosis Date Noted  . Foot swelling 04/12/2017  . Disorder of ejaculation 04/06/2017  . Abrasion of left leg, initial encounter 02/20/2017  . Rash and nonspecific skin eruption 02/08/2017  . Low back pain 01/19/2017  . Dysuria 01/19/2017  . Dyspnea 11/18/2016  . BPH (benign prostatic hyperplasia) 10/05/2016  . Status post liver transplant ([place]) 03/15/2016  . Portal hypertensive gastropathy ([place]) 12/04/2015  . Prediabetes 11/21/2015  . Gynecomastia   . CKD (chronic kidney disease) stage 3, GFR 30-59 ml/min (HCC) 11/04/2015  . Depression, major, single episode, moderate ([place]) 07/09/2015  . Headache 02/19/2015  . Gassiness 01/08/2015  . Insomnia 12/06/2014  . Hepatic encephalopathy ([place]) 11/27/2014  . Hypomagnesemia   . Other fatigue 11/18/2014  . Internal hemorrhoid 10/01/2014  . Erectile dysfunction 10/01/2014  . Alcoholic cirrhosis of liver without ascites ([place]) 07/28/2014  . (HFpEF) heart failure with preserved ejection fraction ([place]) 11/01/2013  . Essential hypertension 06/04/2013  . OSA on CPAP 07/11/2012  . History of alcohol dependence ([place]) 07/11/2012  . Thrombocytopenia ([place]) 12/15/2011  . Morbid obesity with BMI of 40.0-44.9, adult ([place]) 11/16/2011  . Routine general medical examination at a health care facility 09/22/2010  . NEUROPATHY  05/27/2009  . Pedal edema 09/22/2008  . Dyslipidemia 02/15/2007  . Allergic rhinitis 02/15/2007  . GERD 02/15/2007   Past Medical History:  Diagnosis Date  . Alcohol dependence ([place]) 07/11/2012  . Alcoholic cirrhosis of liver with ascites ([place]) 07/2014   s./p transplant 12/2015  . Allergy   . Anemia   . Cellulitis of left leg   . Chronic diastolic heart failure ([place]) 11/01/2013  . CKD (chronic kidney disease) stage 3, GFR 30-59 ml/min (HCC) 11/04/2015  . GERD (gastroesophageal reflux disease)   . Hyperlipidemia   . Hypertension   . Neuropathy   . OSA on CPAP 07/11/2012   HST 07/2013:  AHI 39/hr.    . Thrombocytopenia ([place]) 12/15/2011   Family History  Problem Relation Age of Onset  . Stroke Mother   . Emphysema Mother   . Hypertension Father   . Heart disease Father   . Emphysema Father   . Lung cancer Maternal Aunt   .  Stroke Paternal Grandmother   . Heart attack Neg Hx    Past Surgical History:  Procedure Laterality Date  . COLONOSCOPY  08/2013   hyperplastic polyps, hemorrhoids Ardis Hughs)  . 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 HAND SURGERY  1997  . LEFT AND RIGHT HEART CATHETERIZATION WITH CORONARY ANGIOGRAM N/A 06/10/2013   Procedure: LEFT AND RIGHT HEART CATHETERIZATION WITH CORONARY ANGIOGRAM;  Surgeon: Blane Ohara, MD;  Location: [facility] CATH LAB;  Service: Cardiovascular;  Laterality: N/A;  . LIVER TRANSPLANTATION  63/0160   alcoholic cirrhosis ([facility] at [facility])   Social History   Occupational History  . [employer]     no work lately   Social History Main Topics  . Smoking status: Never Smoker  . Smokeless tobacco: Former Systems developer    Types: Chew  . Alcohol use No     Comment: 4-6 drinks daily - NO ETOH SINCE APRIL 2016  . Drug use: Yes     Comment: remote use of marijuana in the past, has since quit  . Sexual activity: No    OBJECTIVE:  VS:  HT:5\' 6"  (167.6 cm)   WT:286 lb 6.4 oz (129.9 kg)   BMI:46.25    BP:128/68  HR:90bpm  TEMP: ( )  RESP:96 % EXAM: Findings:  WDWN, NAD, Non-toxic appearing Alert & appropriately interactive Not depressed or anxious appearing No increased work of breathing. Pupils are equal. EOM intact without nystagmus No clubbing or cyanosis of the extremities appreciated No significant rashes/lesions/ulcerations overlying the examined area. DP & PT pulses 2+/4.  No significant pretibial edema. Sensation intact to light touch in lower extremities.  Right foot: Overall well aligned.  He has significant swelling of the right lateral aspect of the foot.  He has focal swelling and slight erythema over the lateral midfoot.  He has marked pain with any type of forefoot abduction.    RADIOLOGY: DG Foot Complete Right CLINICAL DATA:  Pain for 2 days  EXAM: RIGHT FOOT COMPLETE - 3+ VIEW  COMPARISON:  None.  FINDINGS: Frontal, oblique, and lateral views were obtained. There is mild generalized soft tissue swelling. No fracture or dislocation. There is narrowing of all PIP and DIP joints. No erosive change. There is a spur arising from the inferior calcaneus.  IMPRESSION: Soft tissue swelling. No fracture or dislocation. Narrowing multiple distal joints. Inferior calcaneal spur.  Electronically Signed   By: Lowella Grip III M.D.   On: 04/12/2017 11:46  ASSESSMENT & PLAN:      ICD-10-CM   1. Right foot pain M79.671 DG Foot Complete Right    ketorolac (TORADOL) injection 60 mg    methylPREDNISolone acetate (DEPO-MEDROL) injection 80 mg  2. Status post liver transplant ([place]) Z94.4   3. CKD (chronic kidney disease) stage 3, GFR 30-59 ml/min (HCC) N18.3   4. Foot swelling M79.89    ================================================================= Status post liver transplant ([place]) Immunosuppression likely related to increased risk of gout.  Foot swelling Symptoms are consistent with acute gouty flare.  Will start on acute treatment and  will need blood work rechecked in 4 weeks.  CKD (chronic kidney disease) stage 3, GFR 30-59 ml/min Systemic corticosteroids and low-dose Toradol provided today.  Avoid NSAIDs if possible and follow-up with blood work in 4 weeks.  No notes on file ================================================================= Patient Instructions  I suspect this is a gout flare up.  We will need to see you backin 4 weeks and check some blood work at that  time  Gout Gout is painful swelling that can occur in some of your joints. Gout is a type of arthritis. This condition is caused by having too much uric acid in your body. Uric acid is a chemical that forms when your body breaks down substances called purines. Purines are important for building body proteins. When your body has too much uric acid, sharp crystals can form and build up inside your joints. This causes pain and swelling. Gout attacks can happen quickly and be very painful (acute gout). Over time, the attacks can affect more joints and become more frequent (chronic gout). Gout can also cause uric acid to build up under your skin and inside your kidneys. What are the causes? This condition is caused by too much uric acid in your blood. This can occur because:  Your kidneys do not remove enough uric acid from your blood. This is the most common cause.  Your body makes too much uric acid. This can occur with some cancers and cancer treatments. It can also occur if your body is breaking down too many red blood cells (hemolytic anemia).  You eat too many foods that are high in purines. These foods include organ meats and some seafood. Alcohol, especially beer, is also high in purines.  A gout attack may be triggered by trauma or stress. What increases the risk? This condition is more likely to develop in people who:  Have a family history of gout.  Are male and middle-aged.  Are male and have gone through menopause.  Are obese.  Frequently  drink alcohol, especially beer.  Are dehydrated.  Lose weight too quickly.  Have an organ transplant.  Have lead poisoning.  Take certain medicines, including aspirin, cyclosporine, diuretics, levodopa, and niacin.  Have kidney disease or psoriasis.  What are the signs or symptoms? An attack of acute gout happens quickly. It usually occurs in just one joint. The most common place is the big toe. Attacks often start at night. Other joints that may be affected include joints of the feet, ankle, knee, fingers, wrist, or elbow. Symptoms may include:  Severe pain.  Warmth.  Swelling.  Stiffness.  Tenderness. The affected joint may be very painful to touch.  Shiny, red, or purple skin.  Chills and fever.  Chronic gout may cause symptoms more frequently. More joints may be involved. You may also have white or yellow lumps (tophi) on your hands or feet or in other areas near your joints. How is this diagnosed? This condition is diagnosed based on your symptoms, medical history, and physical exam. You may have tests, such as:  Blood tests to measure uric acid levels.  Removal of joint fluid with a needle (aspiration) to look for uric acid crystals.  X-rays to look for joint damage.  How is this treated? Treatment for this condition has two phases: treating an acute attack and preventing future attacks. Acute gout treatment may include medicines to reduce pain and swelling, including:  NSAIDs.  Steroids. These are strong anti-inflammatory medicines that can be taken by mouth (orally) or injected into a joint.  Colchicine. This medicine relieves pain and swelling when it is taken soon after an attack. It can be given orally or through an IV tube.  Preventive treatment may include:  Daily use of smaller doses of NSAIDs or colchicine.  Use of a medicine that reduces uric acid levels in your blood.  Changes to your diet. You may need to see a specialist about healthy  eating  (dietitian).  Follow these instructions at home: During a Gout Attack  If directed, apply ice to the affected area: ? Put ice in a plastic bag. ? Place a towel between your skin and the bag. ? Leave the ice on for 20 minutes, 2-3 times a day.  Rest the joint as much as possible. If the affected joint is in your leg, you may be given crutches to use.  Raise (elevate) the affected joint above the level of your heart as often as possible.  Drink enough fluids to keep your urine clear or pale yellow.  Take over-the-counter and prescription medicines only as told by your health care provider.  Do not drive or operate heavy machinery while taking prescription pain medicine.  Follow instructions from your health care provider about eating or drinking restrictions.  Return to your normal activities as told by your health care provider. Ask your health care provider what activities are safe for you. Avoiding Future Gout Attacks  Follow a low-purine diet as told by your dietitian or health care provider. Avoid foods and drinks that are high in purines, including liver, kidney, anchovies, asparagus, herring, mushrooms, mussels, and beer.  Limit alcohol intake to no more than 1 drink a day for nonpregnant women and 2 drinks a day for men. One drink equals 12 oz of beer, 5 oz of wine, or 1 oz of hard liquor.  Maintain a healthy weight or lose weight if you are overweight. If you want to lose weight, talk with your health care provider. It is important that you do not lose weight too quickly.  Start or maintain an exercise program as told by your health care provider.  Drink enough fluids to keep your urine clear or pale yellow.  Take over-the-counter and prescription medicines only as told by your health care provider.  Keep all follow-up visits as told by your health care provider. This is important. Contact a health care provider if:  You have another gout attack.  You continue to  have symptoms of a gout attack after10 days of treatment.  You have side effects from your medicines.  You have chills or a fever.  You have burning pain when you urinate.  You have pain in your lower back or belly. Get help right away if:  You have severe or uncontrolled pain.  You cannot urinate. This information is not intended to replace advice given to you by your health care provider. Make sure you discuss any questions you have with your health care provider. Document Released: 06/10/2000 Document Revised: 11/19/2015 Document Reviewed: 03/26/2015 Elsevier Interactive Patient Education  2017 [place] are compounds that affect the level of uric acid in your body. A low-purine diet is a diet that is low in purines. Eating a low-purine diet can prevent the level of uric acid in your body from getting too high and causing gout or kidney stones or both. What do I need to know about this diet?  Choose low-purine foods. Examples of low-purine foods are listed in the next section.  Drink plenty of fluids, especially water. Fluids can help remove uric acid from your body. Try to drink 8-16 cups (1.9-3.8 L) a day.  Limit foods high in fat, especially saturated fat, as fat makes it harder for the body to get rid of uric acid. Foods high in saturated fat include pizza, cheese, ice cream, whole milk, fried foods, and gravies. Choose foods that are lower in  fat and lean sources of protein. Use olive oil when cooking as it contains healthy fats that are not high in saturated fat.  Limit alcohol. Alcohol interferes with the elimination of uric acid from your body. If you are having a gout attack, avoid all alcohol.  Keep in mind that different people's bodies react differently to different foods. You will probably learn over time which foods do or do not affect you. If you discover that a food tends to cause your gout to flare up, avoid eating that food. You can  more freely enjoy foods that do not cause problems. If you have any questions about a food item, talk to your dietitian or health care provider. Which foods are low, moderate, and high in purines? The following is a list of foods that are low, moderate, and high in purines. You can eat any amount of the foods that are low in purines. You may be able to have small amounts of foods that are moderate in purines. Ask your health care provider how much of a food moderate in purines you can have. Avoid foods high in purines. Grains  Foods low in purines: Enriched white bread, pasta, rice, cake, cornbread, popcorn.  Foods moderate in purines: Whole-grain breads and cereals, wheat germ, bran, oatmeal. Uncooked oatmeal. Dry wheat bran or wheat germ.  Foods high in purines: Pancakes, [place] toast, biscuits, muffins. Vegetables  Foods low in purines: All vegetables, except those that are moderate in purines.  Foods moderate in purines: Asparagus, cauliflower, spinach, mushrooms, green peas. Fruits  All fruits are low in purines. Meats and other Protein Foods  Foods low in purines: Eggs, nuts, peanut butter.  Foods moderate in purines: 80-90% lean beef, lamb, veal, pork, poultry, fish, eggs, peanut butter, nuts. Crab, lobster, oysters, and shrimp. Cooked dried beans, peas, and lentils.  Foods high in purines: Anchovies, sardines, herring, mussels, tuna, codfish, scallops, trout, and haddock. Berniece Salines. Organ meats (such as liver or kidney). Tripe. Game meat. Goose. Sweetbreads. Dairy  All dairy foods are low in purines. Low-fat and fat-free dairy products are best because they are low in saturated fat. Beverages  Drinks low in purines: Water, carbonated beverages, tea, coffee, cocoa.  Drinks moderate in purines: Soft drinks and other drinks sweetened with high-fructose corn syrup. Juices. To find whether a food or drink is sweetened with high-fructose corn syrup, look at the ingredients  list.  Drinks high in purines: Alcoholic beverages (such as beer). Condiments  Foods low in purines: Salt, herbs, olives, pickles, relishes, vinegar.  Foods moderate in purines: Butter, margarine, oils, mayonnaise. Fats and Oils  Foods low in purines: All types, except gravies and sauces made with meat.  Foods high in purines: Gravies and sauces made with meat. Other Foods  Foods low in purines: Sugars, sweets, gelatin. Cake. Soups made without meat.  Foods moderate in purines: Meat-based or fish-based soups, broths, or bouillons. Foods and drinks sweetened with high-fructose corn syrup.  Foods high in purines: High-fat desserts (such as ice cream, cookies, cakes, pies, doughnuts, and chocolate). Contact your dietitian for more information on foods that are not listed here. This information is not intended to replace advice given to you by your health care provider. Make sure you discuss any questions you have with your health care provider. Document Released: 10/08/2010 Document Revised: 11/19/2015 Document Reviewed: 05/20/2013 Elsevier Interactive Patient Education  2017 [employer].    =================================================================   Follow-up: Return in about 4 weeks (around 05/10/2017).   CMA/ATC  served as Education administrator during this visit. History, Physical, and Plan performed by medical provider. Documentation and orders reviewed and attested to.      Teresa Coombs, [facility] Physician

## 2017-04-12 NOTE — Patient Instructions (Signed)
I suspect this is a gout flare up.  We will need to see you backin 4 weeks and check some blood work at that time  Gout Gout is painful swelling that can occur in some of your joints. Gout is a type of arthritis. This condition is caused by having too much uric acid in your body. Uric acid is a chemical that forms when your body breaks down substances called purines. Purines are important for building body proteins. When your body has too much uric acid, sharp crystals can form and build up inside your joints. This causes pain and swelling. Gout attacks can happen quickly and be very painful (acute gout). Over time, the attacks can affect more joints and become more frequent (chronic gout). Gout can also cause uric acid to build up under your skin and inside your kidneys. What are the causes? This condition is caused by too much uric acid in your blood. This can occur because:  Your kidneys do not remove enough uric acid from your blood. This is the most common cause.  Your body makes too much uric acid. This can occur with some cancers and cancer treatments. It can also occur if your body is breaking down too many red blood cells (hemolytic anemia).  You eat too many foods that are high in purines. These foods include organ meats and some seafood. Alcohol, especially beer, is also high in purines.  A gout attack may be triggered by trauma or stress. What increases the risk? This condition is more likely to develop in people who:  Have a family history of gout.  Are male and middle-aged.  Are male and have gone through menopause.  Are obese.  Frequently drink alcohol, especially beer.  Are dehydrated.  Lose weight too quickly.  Have an organ transplant.  Have lead poisoning.  Take certain medicines, including aspirin, cyclosporine, diuretics, levodopa, and niacin.  Have kidney disease or psoriasis.  What are the signs or symptoms? An attack of acute gout happens quickly. It  usually occurs in just one joint. The most common place is the big toe. Attacks often start at night. Other joints that may be affected include joints of the feet, ankle, knee, fingers, wrist, or elbow. Symptoms may include:  Severe pain.  Warmth.  Swelling.  Stiffness.  Tenderness. The affected joint may be very painful to touch.  Shiny, red, or purple skin.  Chills and fever.  Chronic gout may cause symptoms more frequently. More joints may be involved. You may also have white or yellow lumps (tophi) on your hands or feet or in other areas near your joints. How is this diagnosed? This condition is diagnosed based on your symptoms, medical history, and physical exam. You may have tests, such as:  Blood tests to measure uric acid levels.  Removal of joint fluid with a needle (aspiration) to look for uric acid crystals.  X-rays to look for joint damage.  How is this treated? Treatment for this condition has two phases: treating an acute attack and preventing future attacks. Acute gout treatment may include medicines to reduce pain and swelling, including:  NSAIDs.  Steroids. These are strong anti-inflammatory medicines that can be taken by mouth (orally) or injected into a joint.  Colchicine. This medicine relieves pain and swelling when it is taken soon after an attack. It can be given orally or through an IV tube.  Preventive treatment may include:  Daily use of smaller doses of NSAIDs or colchicine.  Use  of a medicine that reduces uric acid levels in your blood.  Changes to your diet. You may need to see a specialist about healthy eating (dietitian).  Follow these instructions at home: During a Gout Attack  If directed, apply ice to the affected area: ? Put ice in a plastic bag. ? Place a towel between your skin and the bag. ? Leave the ice on for 20 minutes, 2-3 times a day.  Rest the joint as much as possible. If the affected joint is in your leg, you may be  given crutches to use.  Raise (elevate) the affected joint above the level of your heart as often as possible.  Drink enough fluids to keep your urine clear or pale yellow.  Take over-the-counter and prescription medicines only as told by your health care provider.  Do not drive or operate heavy machinery while taking prescription pain medicine.  Follow instructions from your health care provider about eating or drinking restrictions.  Return to your normal activities as told by your health care provider. Ask your health care provider what activities are safe for you. Avoiding Future Gout Attacks  Follow a low-purine diet as told by your dietitian or health care provider. Avoid foods and drinks that are high in purines, including liver, kidney, anchovies, asparagus, herring, mushrooms, mussels, and beer.  Limit alcohol intake to no more than 1 drink a day for nonpregnant women and 2 drinks a day for men. One drink equals 12 oz of beer, 5 oz of wine, or 1 oz of hard liquor.  Maintain a healthy weight or lose weight if you are overweight. If you want to lose weight, talk with your health care provider. It is important that you do not lose weight too quickly.  Start or maintain an exercise program as told by your health care provider.  Drink enough fluids to keep your urine clear or pale yellow.  Take over-the-counter and prescription medicines only as told by your health care provider.  Keep all follow-up visits as told by your health care provider. This is important. Contact a health care provider if:  You have another gout attack.  You continue to have symptoms of a gout attack after10 days of treatment.  You have side effects from your medicines.  You have chills or a fever.  You have burning pain when you urinate.  You have pain in your lower back or belly. Get help right away if:  You have severe or uncontrolled pain.  You cannot urinate. This information is not  intended to replace advice given to you by your health care provider. Make sure you discuss any questions you have with your health care provider. Document Released: 06/10/2000 Document Revised: 11/19/2015 Document Reviewed: 03/26/2015 Elsevier Interactive Patient Education  2017 [place] are compounds that affect the level of uric acid in your body. A low-purine diet is a diet that is low in purines. Eating a low-purine diet can prevent the level of uric acid in your body from getting too high and causing gout or kidney stones or both. What do I need to know about this diet?  Choose low-purine foods. Examples of low-purine foods are listed in the next section.  Drink plenty of fluids, especially water. Fluids can help remove uric acid from your body. Try to drink 8-16 cups (1.9-3.8 L) a day.  Limit foods high in fat, especially saturated fat, as fat makes it harder for the body to get rid  of uric acid. Foods high in saturated fat include pizza, cheese, ice cream, whole milk, fried foods, and gravies. Choose foods that are lower in fat and lean sources of protein. Use olive oil when cooking as it contains healthy fats that are not high in saturated fat.  Limit alcohol. Alcohol interferes with the elimination of uric acid from your body. If you are having a gout attack, avoid all alcohol.  Keep in mind that different people's bodies react differently to different foods. You will probably learn over time which foods do or do not affect you. If you discover that a food tends to cause your gout to flare up, avoid eating that food. You can more freely enjoy foods that do not cause problems. If you have any questions about a food item, talk to your dietitian or health care provider. Which foods are low, moderate, and high in purines? The following is a list of foods that are low, moderate, and high in purines. You can eat any amount of the foods that are low in purines. You  may be able to have small amounts of foods that are moderate in purines. Ask your health care provider how much of a food moderate in purines you can have. Avoid foods high in purines. Grains  Foods low in purines: Enriched white bread, pasta, rice, cake, cornbread, popcorn.  Foods moderate in purines: Whole-grain breads and cereals, wheat germ, bran, oatmeal. Uncooked oatmeal. Dry wheat bran or wheat germ.  Foods high in purines: Pancakes, [place] toast, biscuits, muffins. Vegetables  Foods low in purines: All vegetables, except those that are moderate in purines.  Foods moderate in purines: Asparagus, cauliflower, spinach, mushrooms, green peas. Fruits  All fruits are low in purines. Meats and other Protein Foods  Foods low in purines: Eggs, nuts, peanut butter.  Foods moderate in purines: 80-90% lean beef, lamb, veal, pork, poultry, fish, eggs, peanut butter, nuts. Crab, lobster, oysters, and shrimp. Cooked dried beans, peas, and lentils.  Foods high in purines: Anchovies, sardines, herring, mussels, tuna, codfish, scallops, trout, and haddock. Berniece Salines. Organ meats (such as liver or kidney). Tripe. Game meat. Goose. Sweetbreads. Dairy  All dairy foods are low in purines. Low-fat and fat-free dairy products are best because they are low in saturated fat. Beverages  Drinks low in purines: Water, carbonated beverages, tea, coffee, cocoa.  Drinks moderate in purines: Soft drinks and other drinks sweetened with high-fructose corn syrup. Juices. To find whether a food or drink is sweetened with high-fructose corn syrup, look at the ingredients list.  Drinks high in purines: Alcoholic beverages (such as beer). Condiments  Foods low in purines: Salt, herbs, olives, pickles, relishes, vinegar.  Foods moderate in purines: Butter, margarine, oils, mayonnaise. Fats and Oils  Foods low in purines: All types, except gravies and sauces made with meat.  Foods high in purines: Gravies and  sauces made with meat. Other Foods  Foods low in purines: Sugars, sweets, gelatin. Cake. Soups made without meat.  Foods moderate in purines: Meat-based or fish-based soups, broths, or bouillons. Foods and drinks sweetened with high-fructose corn syrup.  Foods high in purines: High-fat desserts (such as ice cream, cookies, cakes, pies, doughnuts, and chocolate). Contact your dietitian for more information on foods that are not listed here. This information is not intended to replace advice given to you by your health care provider. Make sure you discuss any questions you have with your health care provider. Document Released: 10/08/2010 Document Revised: 11/19/2015 Document Reviewed: 05/20/2013  Chartered certified accountant Patient Education  [employer].

## 2017-04-14 DIAGNOSIS — Z944 Liver transplant status: Secondary | ICD-10-CM | POA: Diagnosis not present

## 2017-04-14 DIAGNOSIS — Z4823 Encounter for aftercare following liver transplant: Secondary | ICD-10-CM | POA: Diagnosis not present

## 2017-04-14 DIAGNOSIS — B259 Cytomegaloviral disease, unspecified: Secondary | ICD-10-CM | POA: Diagnosis not present

## 2017-04-14 NOTE — Assessment & Plan Note (Signed)
Systemic corticosteroids and low-dose Toradol provided today.  Avoid NSAIDs if possible and follow-up with blood work in 4 weeks.

## 2017-04-14 NOTE — Assessment & Plan Note (Signed)
Symptoms are consistent with acute gouty flare.  Will start on acute treatment and will need blood work rechecked in 4 weeks.

## 2017-04-14 NOTE — Assessment & Plan Note (Signed)
Immunosuppression likely related to increased risk of gout.

## 2017-04-19 ENCOUNTER — Telehealth: Payer: Self-pay | Admitting: Family Medicine

## 2017-04-19 MED ORDER — COLCHICINE 0.6 MG PO CAPS: 1.0000 | ORAL_CAPSULE | Freq: Every day | ORAL | 0 refills | Status: DC | PRN

## 2017-04-19 NOTE — Telephone Encounter (Signed)
Seen at wife's appt. Gout significant improved since shots and medrol dosepack, however after completing oral steroid sxs recurring R lateral foot.  He did not receive colchicine samples from [facility] - will send colchicine course to use PRN.  Lab Results  Component Value Date   CREATININE 1.4 (A) 03/02/2017

## 2017-04-21 ENCOUNTER — Telehealth: Payer: Self-pay

## 2017-04-21 DIAGNOSIS — I129 Hypertensive chronic kidney disease with stage 1 through stage 4 chronic kidney disease, or unspecified chronic kidney disease: Secondary | ICD-10-CM | POA: Diagnosis not present

## 2017-04-21 DIAGNOSIS — E669 Obesity, unspecified: Secondary | ICD-10-CM | POA: Diagnosis not present

## 2017-04-21 DIAGNOSIS — N183 Chronic kidney disease, stage 3 (moderate): Secondary | ICD-10-CM | POA: Diagnosis not present

## 2017-04-21 DIAGNOSIS — D631 Anemia in chronic kidney disease: Secondary | ICD-10-CM | POA: Diagnosis not present

## 2017-04-21 DIAGNOSIS — M109 Gout, unspecified: Secondary | ICD-10-CM | POA: Diagnosis not present

## 2017-04-21 DIAGNOSIS — N2581 Secondary hyperparathyroidism of renal origin: Secondary | ICD-10-CM | POA: Diagnosis not present

## 2017-04-21 NOTE — Telephone Encounter (Signed)
Refill addressed 04/06/17.

## 2017-04-21 NOTE — Telephone Encounter (Addendum)
Started PA for tamsulosin [place], Rx#:  [id number]] and doxazosin [place], Rx#:  [id number], ref #:  [id number]].  Had to do on the phone with [facility]. Representative says no PA is needed for tamsulosin. Suggested we wait until PA for doxazosin is approved/denied before filling tamsulosin. Decision can take 24-72 hrs.

## 2017-04-25 NOTE — Telephone Encounter (Signed)
Received faxed PA approval for doxazosin good until 04/23/2018. Spoke with [facility] order, confirmed both doxazosin and tamsulosin were shipped out this morning.

## 2017-04-26 ENCOUNTER — Other Ambulatory Visit: Payer: Self-pay | Admitting: Family Medicine

## 2017-05-03 DIAGNOSIS — B259 Cytomegaloviral disease, unspecified: Secondary | ICD-10-CM | POA: Diagnosis not present

## 2017-05-03 DIAGNOSIS — Z4823 Encounter for aftercare following liver transplant: Secondary | ICD-10-CM | POA: Diagnosis not present

## 2017-05-03 DIAGNOSIS — Z944 Liver transplant status: Secondary | ICD-10-CM | POA: Diagnosis not present

## 2017-05-10 ENCOUNTER — Ambulatory Visit: Payer: Medicare HMO | Admitting: Sports Medicine

## 2017-05-18 ENCOUNTER — Encounter (HOSPITAL_COMMUNITY): Payer: Self-pay

## 2017-05-18 ENCOUNTER — Other Ambulatory Visit: Payer: Self-pay

## 2017-05-18 DIAGNOSIS — D696 Thrombocytopenia, unspecified: Secondary | ICD-10-CM | POA: Diagnosis not present

## 2017-05-18 DIAGNOSIS — D649 Anemia, unspecified: Secondary | ICD-10-CM | POA: Insufficient documentation

## 2017-05-18 DIAGNOSIS — N183 Chronic kidney disease, stage 3 (moderate): Secondary | ICD-10-CM | POA: Insufficient documentation

## 2017-05-18 DIAGNOSIS — Z7982 Long term (current) use of aspirin: Secondary | ICD-10-CM | POA: Insufficient documentation

## 2017-05-18 DIAGNOSIS — I509 Heart failure, unspecified: Secondary | ICD-10-CM | POA: Insufficient documentation

## 2017-05-18 DIAGNOSIS — E790 Hyperuricemia without signs of inflammatory arthritis and tophaceous disease: Secondary | ICD-10-CM | POA: Diagnosis not present

## 2017-05-18 DIAGNOSIS — M79672 Pain in left foot: Secondary | ICD-10-CM | POA: Diagnosis present

## 2017-05-18 DIAGNOSIS — Z940 Kidney transplant status: Secondary | ICD-10-CM | POA: Insufficient documentation

## 2017-05-18 DIAGNOSIS — Z79899 Other long term (current) drug therapy: Secondary | ICD-10-CM | POA: Diagnosis not present

## 2017-05-18 DIAGNOSIS — N179 Acute kidney failure, unspecified: Secondary | ICD-10-CM | POA: Diagnosis not present

## 2017-05-18 DIAGNOSIS — M109 Gout, unspecified: Secondary | ICD-10-CM | POA: Diagnosis not present

## 2017-05-18 DIAGNOSIS — I130 Hypertensive heart and chronic kidney disease with heart failure and stage 1 through stage 4 chronic kidney disease, or unspecified chronic kidney disease: Secondary | ICD-10-CM | POA: Diagnosis not present

## 2017-05-18 DIAGNOSIS — M10072 Idiopathic gout, left ankle and foot: Secondary | ICD-10-CM | POA: Diagnosis not present

## 2017-05-18 NOTE — ED Triage Notes (Signed)
Patient arrives to Ed via POV; pt c/o of having gout in left foot; pt states recent gout in right foot and pain feels the same; pt states pain at 10/10 on arrival. Pt states he is able to hobble on foot but not walk to long or far; pt a&ox 4 on arrival.-Monique,RN

## 2017-05-19 ENCOUNTER — Emergency Department (HOSPITAL_COMMUNITY)
Admission: EM | Admit: 2017-05-19 | Discharge: 2017-05-19 | Disposition: A | Discharge status: Home / Self Care | Payer: Medicare HMO | Attending: Emergency Medicine | Admitting: Emergency Medicine

## 2017-05-19 DIAGNOSIS — N289 Disorder of kidney and ureter, unspecified: Secondary | ICD-10-CM

## 2017-05-19 DIAGNOSIS — D649 Anemia, unspecified: Secondary | ICD-10-CM

## 2017-05-19 DIAGNOSIS — M109 Gout, unspecified: Secondary | ICD-10-CM

## 2017-05-19 DIAGNOSIS — E790 Hyperuricemia without signs of inflammatory arthritis and tophaceous disease: Secondary | ICD-10-CM

## 2017-05-19 DIAGNOSIS — D696 Thrombocytopenia, unspecified: Secondary | ICD-10-CM

## 2017-05-19 LAB — BASIC METABOLIC PANEL
ANION GAP: 8 (ref 5–15)
BUN: 22 mg/dL — ABNORMAL HIGH (ref 6–20)
CALCIUM: 8.7 mg/dL — AB (ref 8.9–10.3)
CO2: 27 mmol/L (ref 22–32)
CREATININE: 1.71 mg/dL — AB (ref 0.61–1.24)
Chloride: 103 mmol/L (ref 101–111)
GFR, EST AFRICAN AMERICAN: 52 mL/min — AB (ref >60)
GFR, EST NON AFRICAN AMERICAN: 45 mL/min — AB (ref >60)
Glucose, Bld: 198 mg/dL — ABNORMAL HIGH (ref 65–99)
Potassium: 4.1 mmol/L (ref 3.5–5.1)
Sodium: 138 mmol/L (ref 135–145)

## 2017-05-19 LAB — CBC WITH DIFFERENTIAL/PLATELET
BASOS ABS: 0 10*3/uL (ref 0.0–0.1)
BASOS PCT: 0 %
EOS ABS: 0 10*3/uL (ref 0.0–0.7)
Eosinophils Relative: 1 %
HCT: 33.5 % — ABNORMAL LOW (ref 39.0–52.0)
HEMOGLOBIN: 10.6 g/dL — AB (ref 13.0–17.0)
Lymphocytes Relative: 14 %
Lymphs Abs: 1.1 10*3/uL (ref 0.7–4.0)
MCH: 27.4 pg (ref 26.0–34.0)
MCHC: 31.6 g/dL (ref 30.0–36.0)
MCV: 86.6 fL (ref 78.0–100.0)
MONOS PCT: 6 %
Monocytes Absolute: 0.5 10*3/uL (ref 0.1–1.0)
NEUTROS PCT: 79 %
Neutro Abs: 6.3 10*3/uL (ref 1.7–7.7)
Platelets: 122 10*3/uL — ABNORMAL LOW (ref 150–400)
RBC: 3.87 MIL/uL — ABNORMAL LOW (ref 4.22–5.81)
RDW: 15.3 % (ref 11.5–15.5)
WBC: 7.9 10*3/uL (ref 4.0–10.5)

## 2017-05-19 LAB — URIC ACID: URIC ACID, SERUM: 10.4 mg/dL — AB (ref 4.4–7.6)

## 2017-05-19 MED ORDER — OXYCODONE HCL 5 MG PO TABS
5.0000 mg | ORAL_TABLET | Freq: Once | ORAL | Status: AC
  Administered 2017-05-19: 5 mg via ORAL
  Filled 2017-05-19: qty 1

## 2017-05-19 MED ORDER — PREDNISONE 50 MG PO TABS: 50.0000 mg | ORAL_TABLET | Freq: Every day | ORAL | 0 refills | Status: DC

## 2017-05-19 MED ORDER — OXYCODONE HCL 5 MG PO TABS
5.0000 mg | ORAL_TABLET | ORAL | 0 refills | Status: DC | PRN
Start: 2017-05-19 — End: 2019-07-23

## 2017-05-19 MED ORDER — PREDNISONE 20 MG PO TABS
60.0000 mg | ORAL_TABLET | Freq: Once | ORAL | Status: AC
  Administered 2017-05-19: 60 mg via ORAL
  Filled 2017-05-19: qty 3

## 2017-05-19 NOTE — ED Provider Notes (Signed)
[facility] EMERGENCY DEPARTMENT Provider Note   CSN: [id number] Arrival date & time: 05/18/17  2234     History   Chief Complaint Left foot pain   HPI Colton Porter is a 51 y.o. male.  The history is provided by the patient.  He had onset this morning of severe pain in his left foot with some radiation up into his leg.  He rates pain at 10/10.  Is worse with any type of pressure.  He is not able to ambulate.  He did have a similar episode about 6 weeks ago involving his right foot which was diagnosed as gout.  He was given a prescription for U [place], but it was too expensive and he was given a prescription for allopurinol 100 mg which he has been taking.  Today, he has not done anything for his pain.  He has a history of chronic kidney disease and is status post liver transplant.  Past Medical History:  Diagnosis Date  . Alcohol dependence ([place]) 07/11/2012  . Alcoholic cirrhosis of liver with ascites ([place]) 07/2014   s./p transplant 12/2015  . Allergy   . Anemia   . Cellulitis of left leg   . Chronic diastolic heart failure ([place]) 11/01/2013  . CKD (chronic kidney disease) stage 3, GFR 30-59 ml/min (HCC) 11/04/2015  . GERD (gastroesophageal reflux disease)   . Hyperlipidemia   . Hypertension   . Neuropathy   . OSA on CPAP 07/11/2012   HST 07/2013:  AHI 39/hr.    . Thrombocytopenia ([place]) 12/15/2011    Patient Active Problem List   Diagnosis Date Noted  . Foot swelling 04/12/2017  . Disorder of ejaculation 04/06/2017  . Abrasion of left leg, initial encounter 02/20/2017  . Rash and nonspecific skin eruption 02/08/2017  . Low back pain 01/19/2017  . Dysuria 01/19/2017  . Dyspnea 11/18/2016  . BPH (benign prostatic hyperplasia) 10/05/2016  . Status post liver transplant ([place]) 03/15/2016  . Portal hypertensive gastropathy ([place]) 12/04/2015  . Prediabetes 11/21/2015  . Gynecomastia   . CKD (chronic kidney disease) stage 3, GFR 30-59 ml/min (HCC) 11/04/2015    . Depression, major, single episode, moderate ([place]) 07/09/2015  . Headache 02/19/2015  . Gassiness 01/08/2015  . Insomnia 12/06/2014  . Hepatic encephalopathy ([place]) 11/27/2014  . Hypomagnesemia   . Other fatigue 11/18/2014  . Internal hemorrhoid 10/01/2014  . Erectile dysfunction 10/01/2014  . Alcoholic cirrhosis of liver without ascites ([place]) 07/28/2014  . (HFpEF) heart failure with preserved ejection fraction ([place]) 11/01/2013  . Essential hypertension 06/04/2013  . OSA on CPAP 07/11/2012  . History of alcohol dependence ([place]) 07/11/2012  . Thrombocytopenia ([place]) 12/15/2011  . Morbid obesity with BMI of 40.0-44.9, adult ([employer]) 11/16/2011  . Routine general medical examination at a health care facility 09/22/2010  . NEUROPATHY 05/27/2009  . Pedal edema 09/22/2008  . Dyslipidemia 02/15/2007  . Allergic rhinitis 02/15/2007  . GERD 02/15/2007    Past Surgical History:  Procedure Laterality Date  . COLONOSCOPY  08/2013   hyperplastic polyps, hemorrhoids Ardis Hughs)  . 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 HAND SURGERY  1997  . LEFT AND RIGHT HEART CATHETERIZATION WITH CORONARY ANGIOGRAM N/A 06/10/2013   Procedure: LEFT AND RIGHT HEART CATHETERIZATION WITH CORONARY ANGIOGRAM;  Surgeon: Blane Ohara, MD;  Location: [facility] CATH LAB;  Service: Cardiovascular;  Laterality: N/A;  . LIVER TRANSPLANTATION  75/1700   alcoholic cirrhosis ([facility] at [facility])  Home Medications    Prior to Admission medications   Medication Sig Start Date End Date Taking? Authorizing Provider  acetaminophen (TYLENOL) 500 MG tablet Take 500 mg by mouth every 6 (six) hours as needed for mild pain.    [provider]  amLODipine (NORVASC) 10 MG tablet Take 1 tablet (10 mg total) by mouth daily. 04/06/17   Ria Bush, MD  ASPIR-LOW 81 MG EC tablet Take 1 tablet (81 mg total) by mouth daily. 04/06/17   Ria Bush, MD  Cholecalciferol  (VITAMIN D3) 2000 units TABS Take 1 tablet by mouth at bedtime.    [provider]  Colchicine (MITIGARE) 0.6 MG CAPS Take 1 capsule by mouth daily as needed. 04/19/17   Ria Bush, MD  docusate sodium (COLACE) 100 MG capsule Take 100 mg by mouth 2 (two) times daily as needed for mild constipation.    [provider]  doxazosin (CARDURA) 8 MG tablet Take 1 tablet (8 mg total) by mouth at bedtime. 04/06/17   Ria Bush, MD  escitalopram (LEXAPRO) 10 MG tablet Take 1 tablet (10 mg total) by mouth daily. 04/06/17   Ria Bush, MD  febuxostat (ULORIC) 40 MG tablet Take 1 tablet (40 mg total) by mouth daily. 04/26/17   Ria Bush, MD  furosemide (LASIX) 40 MG tablet Take 1 tablet (40 mg total) by mouth daily. 04/06/17   Ria Bush, MD  loratadine (CLARITIN) 10 MG tablet Take 10 mg by mouth daily.    [provider]  losartan (COZAAR) 50 MG tablet Take 1 tablet (50 mg total) by mouth daily. 04/06/17   Ria Bush, MD  methylPREDNISolone (MEDROL DOSEPAK) 4 MG TBPK tablet Take by mouth as directed. Take 6 tablets on the first day prescribed then as directed. 04/12/17   Gerda Diss, DO  Multiple Vitamin (MULTIVITAMIN WITH MINERALS) TABS tablet Take 2 tablets by mouth every evening.    [provider]  Omega-3 Fatty Acids (FISH OIL PO) Take 1 tablet by mouth at bedtime.    [provider]  omeprazole (PRILOSEC) 40 MG capsule Take 1 capsule (40 mg total) by mouth daily. 04/06/17   Ria Bush, MD  tacrolimus (PROGRAF) 1 MG capsule Take 2 mg by mouth 2 (two) times daily.     [provider]  tamsulosin (FLOMAX) 0.4 MG CAPS capsule Take 2 capsules (0.8 mg total) by mouth daily. 04/06/17   Ria Bush, MD    Family History Family History  Problem Relation Age of Onset  . Stroke Mother   . Emphysema Mother   . Hypertension Father   . Heart disease Father   . Emphysema Father   . Lung cancer  Maternal Aunt   . Stroke Paternal Grandmother   . Heart attack Neg Hx     Social History Social History   Tobacco Use  . Smoking status: Never Smoker  . Smokeless tobacco: Former Systems developer    Types: Chew  Substance Use Topics  . Alcohol use: No    Alcohol/week: 0.0 oz    Comment: 4-6 drinks daily - NO ETOH SINCE APRIL 2016  . Drug use: Yes    Comment: remote use of marijuana in the past, has since quit     Allergies   Ambien [zolpidem tartrate]; Morphine and related; Hydrochlorothiazide w-triamterene; and Lisinopril   Review of Systems Review of Systems  All other systems reviewed and are negative.    Physical Exam Updated Vital Signs BP (!) 152/83 (BP Location: Left Arm)  Pulse 94   Temp 98.9 F (37.2 C) (Oral)   Resp 20   SpO2 100%   Physical Exam  Nursing note and vitals reviewed.  51 year old male, resting comfortably and in no acute distress. Vital signs are significant for hypertension. Oxygen saturation is 100%, which is normal. Head is normocephalic and atraumatic. PERRLA, EOMI. Oropharynx is clear. Neck is nontender and supple without adenopathy or JVD. Back is nontender and there is no CVA tenderness. Lungs are clear without rales, wheezes, or rhonchi. Chest is nontender. Heart has regular rate and rhythm without murmur. Abdomen is soft, flat, nontender without masses or hepatosplenomegaly and peristalsis is normoactive. Extremities have 2+ edema with moderate venous stasis changes present bilaterally.  Left foot has mild to moderate swelling with mild erythema and diffuse tenderness.  Overall picture is consistent with gout. Skin is warm and dry without rash. Neurologic: Mental status is normal, cranial nerves are intact, there are no motor or sensory deficits.  ED Treatments / Results  Labs (all labs ordered are listed, but only abnormal results are displayed) Labs Reviewed  BASIC METABOLIC PANEL - Abnormal; Notable for the following components:       Result Value   Glucose, Bld 198 (*)    BUN 22 (*)    Creatinine, Ser 1.71 (*)    Calcium 8.7 (*)    GFR calc non Af Amer 45 (*)    GFR calc Af Amer 52 (*)    All other components within normal limits  CBC WITH DIFFERENTIAL/PLATELET - Abnormal; Notable for the following components:   RBC 3.87 (*)    Hemoglobin 10.6 (*)    HCT 33.5 (*)    Platelets 122 (*)    All other components within normal limits  URIC ACID - Abnormal; Notable for the following components:   Uric Acid, Serum 10.4 (*)    All other components within normal limits    Procedures Procedures (including critical care time)  Medications Ordered in ED Medications  oxyCODONE (Oxy IR/ROXICODONE) immediate release tablet 5 mg (not administered)  oxyCODONE (Oxy IR/ROXICODONE) immediate release tablet 5 mg (5 mg Oral Given 05/19/17 0157)  predniSONE (DELTASONE) tablet 60 mg (60 mg Oral Given 05/19/17 0157)     Initial Impression / Assessment and Plan / ED Course  I have reviewed the triage vital signs and the nursing notes.  Pertinent labs & imaging results that were available during my care of the patient were reviewed by me and considered in my medical decision making (see chart for details).  Acute gouty arthritis of the left foot.  Old records are reviewed confirming office visit for gout on October 17.  Patient states that he had significant improvement in pain within 6 hours of getting a steroid injection in his office.  He was also given an injection of ketorolac and discharged with prescriptions for colchicine and methylprednisolone.  I cannot find any uric acid level in our system.  Because he has had a second attack within 6 weeks, I feel he will need to be on a higher dose of allopurinol.  Will check uric acid level today.  He is given a dose of prednisone and oxycodone.  Uric acid level is significantly elevated.  He had very slight relief of pain with initial dose of oxycodone, and this is repeated.  Laboratory  workup does show slight worsening of renal function.  This will need to be followed as an outpatient.  Anemia and thrombocytopenia are felt to  be secondary to his known history of cirrhosis.  NSAIDs are avoided because of renal insufficiency.  He is discharged with prescriptions for oxycodone and prednisone.  He is to follow-up with his PCP in 2 weeks, at which time consideration needs to be given to increasing his allopurinol dose to 300 mg a day.  Final Clinical Impressions(s) / ED Diagnoses   Final diagnoses:  Acute gout of left foot, unspecified cause  Hyperuricemia  Renal insufficiency  Normochromic normocytic anemia  [facility])    ED Discharge Orders        Ordered    oxyCODONE (ROXICODONE) 5 MG immediate release tablet  Every 4 hours PRN     05/19/17 0312    predniSONE (DELTASONE) 50 MG tablet  Daily     06/19/48 7530       Delora Fuel, MD 11/04/00 [phone]

## 2017-05-19 NOTE — Discharge Instructions (Signed)
Talk with your doctor about increasing your dose of allopurinol to 300 mg a day - the 100 mg a day dose was not sufficient to lower your uric acid level or to prevent an attack. You have to wait until the gout attack has resolved to change the allopurinol dose.

## 2017-05-22 ENCOUNTER — Ambulatory Visit: Payer: Medicare HMO | Admitting: Family Medicine

## 2017-05-22 ENCOUNTER — Encounter: Payer: Self-pay | Admitting: Family Medicine

## 2017-05-22 VITALS — BP 132/70 | HR 77 | Temp 97.8°F | Wt 289.0 lb

## 2017-05-22 DIAGNOSIS — N183 Chronic kidney disease, stage 3 unspecified: Secondary | ICD-10-CM

## 2017-05-22 DIAGNOSIS — M109 Gout, unspecified: Secondary | ICD-10-CM | POA: Diagnosis not present

## 2017-05-22 DIAGNOSIS — M1A9XX0 Chronic gout, unspecified, without tophus (tophi): Secondary | ICD-10-CM | POA: Insufficient documentation

## 2017-05-22 DIAGNOSIS — Z944 Liver transplant status: Secondary | ICD-10-CM

## 2017-05-22 MED ORDER — ALLOPURINOL 100 MG PO TABS: 100.0000 mg | ORAL_TABLET | Freq: Every day | ORAL | 3 refills | Status: DC

## 2017-05-22 MED ORDER — DICLOFENAC SODIUM 1 % TD GEL: 1.0000 "application " | Freq: Three times a day (TID) | TRANSDERMAL | 1 refills | Status: DC

## 2017-05-22 MED ORDER — PREDNISONE 20 MG PO TABS: ORAL_TABLET | ORAL | 0 refills | Status: DC

## 2017-05-22 NOTE — Patient Instructions (Addendum)
For gout flare - extend prednisone taper.  Try voltaren gel to foot as needed up to three times daily.  Keep leg elevated. In 2 weeks assuming gout flare has resolved, increase allopurinol to 200mg  daily.  Return in 1 month for lab visit only. Low-Purine Diet Purines are compounds that affect the level of uric acid in your body. A low-purine diet is a diet that is low in purines. Eating a low-purine diet can prevent the level of uric acid in your body from getting too high and causing gout or kidney stones or both. What do I need to know about this diet?  Choose low-purine foods. Examples of low-purine foods are listed in the next section.  Drink plenty of fluids, especially water. Fluids can help remove uric acid from your body. Try to drink 8-16 cups (1.9-3.8 L) a day.  Limit foods high in fat, especially saturated fat, as fat makes it harder for the body to get rid of uric acid. Foods high in saturated fat include pizza, cheese, ice cream, whole milk, fried foods, and gravies. Choose foods that are lower in fat and lean sources of protein. Use olive oil when cooking as it contains healthy fats that are not high in saturated fat.  Limit alcohol. Alcohol interferes with the elimination of uric acid from your body. If you are having a gout attack, avoid all alcohol.  Keep in mind that different people's bodies react differently to different foods. You will probably learn over time which foods do or do not affect you. If you discover that a food tends to cause your gout to flare up, avoid eating that food. You can more freely enjoy foods that do not cause problems. If you have any questions about a food item, talk to your dietitian or health care provider. Which foods are low, moderate, and high in purines? The following is a list of foods that are low, moderate, and high in purines. You can eat any amount of the foods that are low in purines. You may be able to have small amounts of foods that are  moderate in purines. Ask your health care provider how much of a food moderate in purines you can have. Avoid foods high in purines. Grains  Foods low in purines: Enriched white bread, pasta, rice, cake, cornbread, popcorn.  Foods moderate in purines: Whole-grain breads and cereals, wheat germ, bran, oatmeal. Uncooked oatmeal. Dry wheat bran or wheat germ.  Foods high in purines: Pancakes, [place] toast, biscuits, muffins. Vegetables  Foods low in purines: All vegetables, except those that are moderate in purines.  Foods moderate in purines: Asparagus, cauliflower, spinach, mushrooms, green peas. Fruits  All fruits are low in purines. Meats and other Protein Foods  Foods low in purines: Eggs, nuts, peanut butter.  Foods moderate in purines: 80-90% lean beef, lamb, veal, pork, poultry, fish, eggs, peanut butter, nuts. Crab, lobster, oysters, and shrimp. Cooked dried beans, peas, and lentils.  Foods high in purines: Anchovies, sardines, herring, mussels, tuna, codfish, scallops, trout, and haddock. Berniece Salines. Organ meats (such as liver or kidney). Tripe. Game meat. Goose. Sweetbreads. Dairy  All dairy foods are low in purines. Low-fat and fat-free dairy products are best because they are low in saturated fat. Beverages  Drinks low in purines: Water, carbonated beverages, tea, coffee, cocoa.  Drinks moderate in purines: Soft drinks and other drinks sweetened with high-fructose corn syrup. Juices. To find whether a food or drink is sweetened with high-fructose corn syrup, look at  the ingredients list.  Drinks high in purines: Alcoholic beverages (such as beer). Condiments  Foods low in purines: Salt, herbs, olives, pickles, relishes, vinegar.  Foods moderate in purines: Butter, margarine, oils, mayonnaise. Fats and Oils  Foods low in purines: All types, except gravies and sauces made with meat.  Foods high in purines: Gravies and sauces made with meat. Other Foods  Foods low in  purines: Sugars, sweets, gelatin. Cake. Soups made without meat.  Foods moderate in purines: Meat-based or fish-based soups, broths, or bouillons. Foods and drinks sweetened with high-fructose corn syrup.  Foods high in purines: High-fat desserts (such as ice cream, cookies, cakes, pies, doughnuts, and chocolate). Contact your dietitian for more information on foods that are not listed here. This information is not intended to replace advice given to you by your health care provider. Make sure you discuss any questions you have with your health care provider. Document Released: 10/08/2010 Document Revised: 11/19/2015 Document Reviewed: 05/20/2013 Elsevier Interactive Patient Education  2017 [employer].

## 2017-05-22 NOTE — Assessment & Plan Note (Signed)
ER records reviewed. Story consistent with gout flare, urate 10.4. Treat with prolonged prednisone taper, voltaren topically.  Will need higher allopurinol dose - suggested increase to 200mg  daily in 2 wks assuming acute flare has resolved.  I asked him to return in 4 wks for lab visit only to recheck urate.  Pt agrees with plan.

## 2017-05-22 NOTE — Progress Notes (Signed)
BP 132/70 (BP Location: Left Arm, Patient Position: Sitting, Cuff Size: Large)   Pulse 77   Temp 97.8 F (36.6 C) (Oral)   Wt 289 lb (131.1 kg)   SpO2 95%   BMI 46.65 kg/m    CC: ER visit for gout Subjective:    Patient ID: Colton Porter, male    DOB: [birth year], 51 y.o.   MRN: [id number]  HPI: Colton Porter is a 51 y.o. male presenting on 05/22/2017 for ER follow-up (Visited [facility] ER 05/20/47 due to gout in left foot. Second flare in last 6 wks. Wants to discuss allopurinol. Was told by ER doctor strength is not enough to prevent flares)   Here for ER f/u - seen 05/19/2017 for acute gout flare of L foot despite allopurinol 100mg  daily. Some swelling of ankle without redness or warmth. Denies inciting trauma/injury. ER records reviewed. Treated with oxycodone and prednisone 50mg  daily. Last prednisone dose tomorrow. Seen at [place] location 10/17 with R foot gout flare.  Lab Results  Component Value Date   LABURIC 10.4 (H) 05/19/2017   Lab Results  Component Value Date   CREATININE 1.71 (H) 05/19/2017     Past Medical History:  Diagnosis Date  . Alcohol dependence ([place]) 07/11/2012  . Alcoholic cirrhosis of liver with ascites ([place]) 07/2014   s./p transplant 12/2015  . Allergy   . Anemia   . Cellulitis of left leg   . Chronic diastolic heart failure ([place]) 11/01/2013  . CKD (chronic kidney disease) stage 3, GFR 30-59 ml/min (HCC) 11/04/2015  . GERD (gastroesophageal reflux disease)   . Hyperlipidemia   . Hypertension   . Neuropathy   . OSA on CPAP 07/11/2012   HST 07/2013:  AHI 39/hr.    . Thrombocytopenia ([place]) 12/15/2011    Relevant past medical, surgical, family and social history reviewed and updated as indicated. Interim medical history since our last visit reviewed. Allergies and medications reviewed and updated. Outpatient Medications Prior to Visit  Medication Sig Dispense Refill  . acetaminophen (TYLENOL) 500 MG tablet Take 500 mg by mouth every 6 (six)  hours as needed for mild pain.    Marland Kitchen amLODipine (NORVASC) 10 MG tablet Take 1 tablet (10 mg total) by mouth daily. 90 tablet 3  . ASPIR-LOW 81 MG EC tablet Take 1 tablet (81 mg total) by mouth daily. 90 tablet 3  . Cholecalciferol (VITAMIN D3) 2000 units TABS Take 1 tablet by mouth at bedtime.    . docusate sodium (COLACE) 100 MG capsule Take 100 mg by mouth 2 (two) times daily as needed for mild constipation.    Marland Kitchen doxazosin (CARDURA) 8 MG tablet Take 1 tablet (8 mg total) by mouth at bedtime. 90 tablet 3  . escitalopram (LEXAPRO) 10 MG tablet Take 1 tablet (10 mg total) by mouth daily. 90 tablet 3  . furosemide (LASIX) 40 MG tablet Take 1 tablet (40 mg total) by mouth daily. 90 tablet 3  . loratadine (CLARITIN) 10 MG tablet Take 10 mg by mouth daily.    Marland Kitchen losartan (COZAAR) 50 MG tablet Take 1 tablet (50 mg total) by mouth daily. 90 tablet 3  . Multiple Vitamin (MULTIVITAMIN WITH MINERALS) TABS tablet Take 2 tablets by mouth every evening.    . Omega-3 Fatty Acids (FISH OIL PO) Take 1 tablet by mouth at bedtime.    Marland Kitchen omeprazole (PRILOSEC) 40 MG capsule Take 1 capsule (40 mg total) by mouth daily. 90 capsule 3  . oxyCODONE (  ROXICODONE) 5 MG immediate release tablet Take 1-2 tablets (5-10 mg total) by mouth every 4 (four) hours as needed for severe pain. 30 tablet 0  . tacrolimus (PROGRAF) 1 MG capsule Take 1-2 mg by mouth 2 (two) times daily. 2 mg in the morning and 1 mg in the evening    . tamsulosin (FLOMAX) 0.4 MG CAPS capsule Take 2 capsules (0.8 mg total) by mouth daily. 180 capsule 3  . allopurinol (ZYLOPRIM) 100 MG tablet Take 100 mg by mouth daily.    . predniSONE (DELTASONE) 50 MG tablet Take 1 tablet (50 mg total) by mouth daily. 5 tablet 0   No facility-administered medications prior to visit.      Per HPI unless specifically indicated in ROS section below Review of Systems     Objective:    BP 132/70 (BP Location: Left Arm, Patient Position: Sitting, Cuff Size: Large)   Pulse  77   Temp 97.8 F (36.6 C) (Oral)   Wt 289 lb (131.1 kg)   SpO2 95%   BMI 46.65 kg/m   Wt Readings from Last 3 Encounters:  05/22/17 289 lb (131.1 kg)  04/12/17 286 lb 6.4 oz (129.9 kg)  04/06/17 292 lb (132.5 kg)    Physical Exam  Constitutional: He appears well-developed and well-nourished. No distress.  Musculoskeletal: He exhibits no edema.  Marked tenderness to palpation L lateral midfoot  No ligament laxity, no pain at ankle ligaments or malleoli 2+ DP on left  Skin: Skin is warm and dry. No rash noted. No erythema.  No erythema or warmth  Nursing note and vitals reviewed.  Results for orders placed or performed during the hospital encounter of 54/27/06  Basic metabolic panel  Result Value Ref Range   Sodium 138 135 - 145 mmol/L   Potassium 4.1 3.5 - 5.1 mmol/L   Chloride 103 101 - 111 mmol/L   CO2 27 22 - 32 mmol/L   Glucose, Bld 198 (H) 65 - 99 mg/dL   BUN 22 (H) 6 - 20 mg/dL   Creatinine, Ser 1.71 (H) 0.61 - 1.24 mg/dL   Calcium 8.7 (L) 8.9 - 10.3 mg/dL   GFR calc non Af Amer 45 (L) >60 mL/min   GFR calc Af Amer 52 (L) >60 mL/min   Anion gap 8 5 - 15  CBC with Differential  Result Value Ref Range   WBC 7.9 4.0 - 10.5 K/uL   RBC 3.87 (L) 4.22 - 5.81 MIL/uL   Hemoglobin 10.6 (L) 13.0 - 17.0 g/dL   HCT 33.5 (L) 39.0 - 52.0 %   MCV 86.6 78.0 - 100.0 fL   MCH 27.4 26.0 - 34.0 pg   MCHC 31.6 30.0 - 36.0 g/dL   RDW 15.3 11.5 - 15.5 %   Platelets 122 (L) 150 - 400 K/uL   Neutrophils Relative % 79 %   Neutro Abs 6.3 1.7 - 7.7 K/uL   Lymphocytes Relative 14 %   Lymphs Abs 1.1 0.7 - 4.0 K/uL   Monocytes Relative 6 %   Monocytes Absolute 0.5 0.1 - 1.0 K/uL   Eosinophils Relative 1 %   Eosinophils Absolute 0.0 0.0 - 0.7 K/uL   Basophils Relative 0 %   Basophils Absolute 0.0 0.0 - 0.1 K/uL  Uric acid  Result Value Ref Range   Uric Acid, Serum 10.4 (H) 4.4 - 7.6 mg/dL      Assessment & Plan:   Problem List Items Addressed This Visit    Acute gout of  left  foot - Primary    ER records reviewed. Story consistent with gout flare, urate 10.4. Treat with prolonged prednisone taper, voltaren topically.  Will need higher allopurinol dose - suggested increase to 200mg  daily in 2 wks assuming acute flare has resolved.  I asked him to return in 4 wks for lab visit only to recheck urate.  Pt agrees with plan.       Relevant Medications   allopurinol (ZYLOPRIM) 100 MG tablet   predniSONE (DELTASONE) 20 MG tablet   Other Relevant Orders   Uric acid   CKD (chronic kidney disease) stage 3, GFR 30-59 ml/min (HCC)   Relevant Orders   Renal function panel   Status post liver transplant ([place])   Relevant Medications   predniSONE (DELTASONE) 20 MG tablet       Follow up plan: No Follow-up on file.  Ria Bush, MD

## 2017-05-23 ENCOUNTER — Ambulatory Visit: Payer: Medicare HMO | Admitting: Family Medicine

## 2017-06-02 ENCOUNTER — Telehealth: Payer: Self-pay

## 2017-06-02 NOTE — Telephone Encounter (Signed)
Started PA for Diclofenac sod 1% gel, key:  [id number]. Received msg:  Unable to process transaction. Please resubmit.

## 2017-06-06 ENCOUNTER — Ambulatory Visit: Payer: Medicare HMO | Admitting: Family Medicine

## 2017-06-07 ENCOUNTER — Encounter: Payer: Self-pay | Admitting: Family Medicine

## 2017-06-07 ENCOUNTER — Ambulatory Visit: Payer: Medicare HMO | Admitting: Family Medicine

## 2017-06-07 VITALS — BP 124/62 | HR 102 | Temp 98.2°F

## 2017-06-07 DIAGNOSIS — Z944 Liver transplant status: Secondary | ICD-10-CM

## 2017-06-07 DIAGNOSIS — N183 Chronic kidney disease, stage 3 unspecified: Secondary | ICD-10-CM

## 2017-06-07 DIAGNOSIS — R05 Cough: Secondary | ICD-10-CM | POA: Diagnosis not present

## 2017-06-07 DIAGNOSIS — M10071 Idiopathic gout, right ankle and foot: Secondary | ICD-10-CM

## 2017-06-07 DIAGNOSIS — I5030 Unspecified diastolic (congestive) heart failure: Secondary | ICD-10-CM

## 2017-06-07 DIAGNOSIS — K7030 Alcoholic cirrhosis of liver without ascites: Secondary | ICD-10-CM | POA: Diagnosis not present

## 2017-06-07 DIAGNOSIS — R059 Cough, unspecified: Secondary | ICD-10-CM | POA: Insufficient documentation

## 2017-06-07 MED ORDER — PREDNISONE 20 MG PO TABS: ORAL_TABLET | ORAL | 0 refills | Status: DC

## 2017-06-07 MED ORDER — METHYLPREDNISOLONE ACETATE 80 MG/ML IJ SUSP
80.0000 mg | Freq: Once | INTRAMUSCULAR | Status: AC
  Administered 2017-06-07: 80 mg via INTRAMUSCULAR

## 2017-06-07 NOTE — Assessment & Plan Note (Signed)
Limits medication options to treat gout. Rpt labs pending later this month.

## 2017-06-07 NOTE — Assessment & Plan Note (Addendum)
New - but seems euvolemic today so doubt CHF exacerbation. Lungs clear. No fever. Anticipate viral respiratory infection. Supportive care recommended. Red flags to update for further eval/treatment reviewed.

## 2017-06-07 NOTE — Patient Instructions (Addendum)
Shot of steroid today then prednisone taper orally - sent to pharmacy.  Back down on allopurinol to 100mg  dose until gout flare has resolved.  Once acute gout flare has resolved, let's retry allopurinol 200mg  dose.  May continue voltaren gel to painful foot.

## 2017-06-07 NOTE — Assessment & Plan Note (Signed)
Anticipate recurrent gout flare - made worse by recently increasing allopurinol to 200mg  while in active flare. Treat with depo medrol 80mg  IM today and start 10d prednisone taper.  Reviewed avoidance of purine rich foods.  Off colchicine at this time.

## 2017-06-07 NOTE — Progress Notes (Signed)
BP 124/62 (BP Location: Left Arm, Patient Position: Sitting, Cuff Size: Large)   Pulse (!) 102   Temp 98.2 F (36.8 C) (Oral)   SpO2 95%    CC: gout Subjective:    Patient ID: Colton Porter, male    DOB: 1965-12-30, 51 y.o.   MRN: [id number]  HPI: Colton Porter is a 51 y.o. male presenting on 06/07/2017 for Gout (bilateral feet, right worse. Started 06/05/17.) and Cough (productive. Started 12/101/18. Has not taken anything. Not sure what to take since transplant.)   See prior note for details. Seen at ER 05/19/2017 with acute L foot gout flare despite allopurinol 100mg  daily. Treated with oxycodone and prendisone 50mg  daily x 5 days which was prolonged. Topical voltaren gel.   3d h/o L dorsal foot pain and swelling, yesterday spreading to R foot. Without redness of warmth of foot. No increased purine rich foods recently. 2 days ago he increased allopurinol to 200mg  dose.   2d h/o cough, chest > head congestion worse when laying flat in bed. Malaise, fatigue, chills. No sick contacts  Voiding well with lasix 40mg  daily.   Relevant past medical, surgical, family and social history reviewed and updated as indicated. Interim medical history since our last visit reviewed. Allergies and medications reviewed and updated. Outpatient Medications Prior to Visit  Medication Sig Dispense Refill  . acetaminophen (TYLENOL) 500 MG tablet Take 500 mg by mouth every 6 (six) hours as needed for mild pain.    Marland Kitchen allopurinol (ZYLOPRIM) 100 MG tablet Take 1 tablet (100 mg total) by mouth daily. Increase to 200mg  daily in 2 weeks 60 tablet 3  . amLODipine (NORVASC) 10 MG tablet Take 1 tablet (10 mg total) by mouth daily. 90 tablet 3  . ASPIR-LOW 81 MG EC tablet Take 1 tablet (81 mg total) by mouth daily. 90 tablet 3  . Cholecalciferol (VITAMIN D3) 2000 units TABS Take 1 tablet by mouth at bedtime.    . diclofenac sodium (VOLTAREN) 1 % GEL Apply 1 application topically 3 (three) times daily. 1 Tube 1   . docusate sodium (COLACE) 100 MG capsule Take 100 mg by mouth 2 (two) times daily as needed for mild constipation.    Marland Kitchen doxazosin (CARDURA) 8 MG tablet Take 1 tablet (8 mg total) by mouth at bedtime. 90 tablet 3  . escitalopram (LEXAPRO) 10 MG tablet Take 1 tablet (10 mg total) by mouth daily. 90 tablet 3  . furosemide (LASIX) 40 MG tablet Take 1 tablet (40 mg total) by mouth daily. 90 tablet 3  . loratadine (CLARITIN) 10 MG tablet Take 10 mg by mouth daily.    Marland Kitchen losartan (COZAAR) 50 MG tablet Take 1 tablet (50 mg total) by mouth daily. 90 tablet 3  . Multiple Vitamin (MULTIVITAMIN WITH MINERALS) TABS tablet Take 2 tablets by mouth every evening.    . Omega-3 Fatty Acids (FISH OIL PO) Take 1 tablet by mouth at bedtime.    Marland Kitchen omeprazole (PRILOSEC) 40 MG capsule Take 1 capsule (40 mg total) by mouth daily. 90 capsule 3  . oxyCODONE (ROXICODONE) 5 MG immediate release tablet Take 1-2 tablets (5-10 mg total) by mouth every 4 (four) hours as needed for severe pain. 30 tablet 0  . tacrolimus (PROGRAF) 1 MG capsule Take 1-2 mg by mouth 2 (two) times daily. 2 mg in the morning and 1 mg in the evening    . tamsulosin (FLOMAX) 0.4 MG CAPS capsule Take 2 capsules (0.8 mg total) by  mouth daily. 180 capsule 3  . predniSONE (DELTASONE) 20 MG tablet Take two tablets daily for 4 days followed by one tablet daily for 4 days 12 tablet 0   No facility-administered medications prior to visit.      Per HPI unless specifically indicated in ROS section below Review of Systems     Objective:    BP 124/62 (BP Location: Left Arm, Patient Position: Sitting, Cuff Size: Large)   Pulse (!) 102   Temp 98.2 F (36.8 C) (Oral)   SpO2 95%   Wt Readings from Last 3 Encounters:  05/22/17 289 lb (131.1 kg)  04/12/17 286 lb 6.4 oz (129.9 kg)  04/06/17 292 lb (132.5 kg)    Physical Exam  Constitutional: He appears well-developed and well-nourished. No distress.  HENT:  Head: Normocephalic and atraumatic.    Mouth/Throat: Oropharynx is clear and moist. No oropharyngeal exudate.  Eyes: Conjunctivae and EOM are normal. Pupils are equal, round, and reactive to light. No scleral icterus.  Neck: Normal range of motion. Neck supple.  Cardiovascular: Normal rate, regular rhythm, normal heart sounds and intact distal pulses.  No murmur heard. Pulmonary/Chest: Effort normal and breath sounds normal. No respiratory distress. He has no wheezes. He has no rales.  Musculoskeletal: He exhibits edema (trace pedal, nonpitting at R>L feet).  2+ DP bilaterally Point tender to palpation R>L dorsal foot without significant erythema or warmth.   Lymphadenopathy:    He has no cervical adenopathy.  Skin: Skin is warm and dry. No rash noted. No erythema.  Psychiatric: He has a normal mood and affect.  Nursing note and vitals reviewed.  Results for orders placed or performed during the hospital encounter of 67/12/45  Basic metabolic panel  Result Value Ref Range   Sodium 138 135 - 145 mmol/L   Potassium 4.1 3.5 - 5.1 mmol/L   Chloride 103 101 - 111 mmol/L   CO2 27 22 - 32 mmol/L   Glucose, Bld 198 (H) 65 - 99 mg/dL   BUN 22 (H) 6 - 20 mg/dL   Creatinine, Ser 1.71 (H) 0.61 - 1.24 mg/dL   Calcium 8.7 (L) 8.9 - 10.3 mg/dL   GFR calc non Af Amer 45 (L) >60 mL/min   GFR calc Af Amer 52 (L) >60 mL/min   Anion gap 8 5 - 15  CBC with Differential  Result Value Ref Range   WBC 7.9 4.0 - 10.5 K/uL   RBC 3.87 (L) 4.22 - 5.81 MIL/uL   Hemoglobin 10.6 (L) 13.0 - 17.0 g/dL   HCT 33.5 (L) 39.0 - 52.0 %   MCV 86.6 78.0 - 100.0 fL   MCH 27.4 26.0 - 34.0 pg   MCHC 31.6 30.0 - 36.0 g/dL   RDW 15.3 11.5 - 15.5 %   Platelets 122 (L) 150 - 400 K/uL   Neutrophils Relative % 79 %   Neutro Abs 6.3 1.7 - 7.7 K/uL   Lymphocytes Relative 14 %   Lymphs Abs 1.1 0.7 - 4.0 K/uL   Monocytes Relative 6 %   Monocytes Absolute 0.5 0.1 - 1.0 K/uL   Eosinophils Relative 1 %   Eosinophils Absolute 0.0 0.0 - 0.7 K/uL   Basophils  Relative 0 %   Basophils Absolute 0.0 0.0 - 0.1 K/uL  Uric acid  Result Value Ref Range   Uric Acid, Serum 10.4 (H) 4.4 - 7.6 mg/dL      Assessment & Plan:   Problem List Items Addressed This Visit    (  HFpEF) heart failure with preserved ejection fraction (HCC)    Seems euvolemic today.       Acute gout - Primary    Anticipate recurrent gout flare - made worse by recently increasing allopurinol to 200mg  while in active flare. Treat with depo medrol 80mg  IM today and start 10d prednisone taper.  Reviewed avoidance of purine rich foods.  Off colchicine at this time.       Relevant Medications   methylPREDNISolone acetate (DEPO-MEDROL) injection 80 mg (Completed)   Alcoholic cirrhosis of liver without ascites (HCC)   CKD (chronic kidney disease) stage 3, GFR 30-59 ml/min (HCC)    Limits medication options to treat gout. Rpt labs pending later this month.       Cough    New - but seems euvolemic today so doubt CHF exacerbation. Lungs clear. No fever. Anticipate viral respiratory infection. Supportive care recommended. Red flags to update for further eval/treatment reviewed.       Status post liver transplant ([place])   Relevant Medications   predniSONE (DELTASONE) 20 MG tablet   methylPREDNISolone acetate (DEPO-MEDROL) injection 80 mg (Completed)       Follow up plan: No Follow-up on file.  Ria Bush, MD

## 2017-06-07 NOTE — Assessment & Plan Note (Signed)
Seems euvolemic today.  

## 2017-06-09 DIAGNOSIS — Z944 Liver transplant status: Secondary | ICD-10-CM | POA: Diagnosis not present

## 2017-06-09 DIAGNOSIS — Z4823 Encounter for aftercare following liver transplant: Secondary | ICD-10-CM | POA: Diagnosis not present

## 2017-06-09 DIAGNOSIS — B259 Cytomegaloviral disease, unspecified: Secondary | ICD-10-CM | POA: Diagnosis not present

## 2017-06-15 NOTE — Telephone Encounter (Signed)
Spoke with Jemma at [facility] to do PA via phone.  Says the rx is non-formulary so she will fax PA to be completed by Dr. Darnell Level and faxed back to [facility] for determination.

## 2017-06-16 ENCOUNTER — Ambulatory Visit: Payer: Self-pay | Admitting: *Deleted

## 2017-06-16 ENCOUNTER — Telehealth: Payer: Self-pay | Admitting: Family Medicine

## 2017-06-16 NOTE — Telephone Encounter (Signed)
Pt has appt 06/21/17 with Dr Darnell Level.

## 2017-06-16 NOTE — Telephone Encounter (Signed)
Elliot Cousin RN added to note;Pt called back as instructed by triage nurse to report no worsening of symptoms, "feel the same." States "I actually can see better close up now." No vision loss, Floaters continue as previously noted. Has appt Wednesday with Dr. [name].To clarify, pt. states he has been drinking "at least" a gallon of water due to increased thirst. Agreeable to be seen Wednesday 06/21/17.

## 2017-06-16 NOTE — Telephone Encounter (Signed)
Spoke with pt, confirms he is drinking water. Denies flashing lights, floaters or vision loss. Says he can see close up but vision is blurry about 10 ft in front of him and further.

## 2017-06-16 NOTE — Telephone Encounter (Signed)
Pt called back as instructed by triage nurse to report no worsening of symptoms, "feel the same." Has appt Wednesday with Dr. [name].To clarify, pt. states he has been drinking "at least" a gallon of water. Agreeable to be seen Wednesday 06/21/17.

## 2017-06-16 NOTE — Telephone Encounter (Signed)
Pt called back as instructed by triage nurse to report no worsening of symptoms, "feel the same." States "I actually can see close up better now." No vision loss, Floaters continue as previously noted. Has appt Wednesday with Dr. [name].To clarify, pt. states he has been drinking "at least" a gallon of water. Agreeable to be seen Wednesday 06/21/17.

## 2017-06-16 NOTE — Telephone Encounter (Signed)
plz call patient- has been drinking a gallon of what - water?  Blurry vision x 5 days - any flashing lights or floaters?  Any vision loss?  If floaters, would recommend referral to eye doctor - let me know and I will place referral.

## 2017-06-16 NOTE — Telephone Encounter (Signed)
Has been drinking at least a gallon to gallon and a half everyday for the last 5 days. Blurred vision started about the same time.  Feeling tired, no energy. No pain except the usual pain in the back. Appointment made for next week. Home care advice given to patient.   Answer Assessment - Initial Assessment Questions 1. DESCRIPTION: "What is the vision loss like? Describe it for me." (e.g., complete vision loss, blurred vision, double vision, floaters, etc.)     Blurred vision, floaters in the best 2. LOCATION: "One or both eyes?" If one, ask: "Which eye?"     both 3. SEVERITY: "Can you see anything?" If so, ask: "What can you see?" (e.g., fine print)     "Can see 5 or 6 feet in front of me"  4. ONSET: "When did this begin?" "Did it start suddenly or has this been gradual?"     Sunday or Monday, suddenly 5. PATTERN: "Does this come and go, or has it been constant since it started?"     constant 6. PAIN: "Is there any pain in your eye(s)?"  (Scale 1-10; or mild, moderate, severe)     No pain 7. CONTACTS-GLASSES: "Do you wear contacts or glasses?"     Reader glasses 8. CAUSE: "What do you think is causing this visual problem?"     Not sure 9. OTHER SYMPTOMS: "Do you have any other symptoms?" (e.g., confusion, headache, arm or leg weakness, speech problems)     no 10. PREGNANCY: "Is there any chance you are pregnant?" "When was your last menstrual period?"       n/a  Protocols used: [place]

## 2017-06-21 ENCOUNTER — Encounter: Payer: Self-pay | Admitting: Family Medicine

## 2017-06-21 ENCOUNTER — Other Ambulatory Visit (INDEPENDENT_AMBULATORY_CARE_PROVIDER_SITE_OTHER): Payer: Medicare HMO

## 2017-06-21 ENCOUNTER — Ambulatory Visit: Payer: Medicare HMO | Admitting: Family Medicine

## 2017-06-21 VITALS — BP 122/72 | HR 97 | Temp 98.6°F | Wt 270.5 lb

## 2017-06-21 DIAGNOSIS — I10 Essential (primary) hypertension: Secondary | ICD-10-CM | POA: Diagnosis not present

## 2017-06-21 DIAGNOSIS — E1165 Type 2 diabetes mellitus with hyperglycemia: Secondary | ICD-10-CM

## 2017-06-21 DIAGNOSIS — N183 Chronic kidney disease, stage 3 unspecified: Secondary | ICD-10-CM

## 2017-06-21 DIAGNOSIS — I5030 Unspecified diastolic (congestive) heart failure: Secondary | ICD-10-CM

## 2017-06-21 DIAGNOSIS — M109 Gout, unspecified: Secondary | ICD-10-CM

## 2017-06-21 DIAGNOSIS — Z944 Liver transplant status: Secondary | ICD-10-CM

## 2017-06-21 DIAGNOSIS — Z6841 Body Mass Index (BMI) 40.0 and over, adult: Secondary | ICD-10-CM | POA: Diagnosis not present

## 2017-06-21 DIAGNOSIS — IMO0001 Reserved for inherently not codable concepts without codable children: Secondary | ICD-10-CM

## 2017-06-21 LAB — RENAL FUNCTION PANEL
Albumin: 4.1 g/dL (ref 3.5–5.2)
BUN: 25 mg/dL — ABNORMAL HIGH (ref 6–23)
CALCIUM: 9.4 mg/dL (ref 8.4–10.5)
CHLORIDE: 96 meq/L (ref 96–112)
CO2: 27 meq/L (ref 19–32)
Creatinine, Ser: 1.72 mg/dL — ABNORMAL HIGH (ref 0.40–1.50)
GFR: 44.82 mL/min — AB (ref >60.00)
Glucose, Bld: 448 mg/dL — ABNORMAL HIGH (ref 70–99)
POTASSIUM: 4.4 meq/L (ref 3.5–5.1)
Phosphorus: 3.4 mg/dL (ref 2.3–4.6)
Sodium: 132 mEq/L — ABNORMAL LOW (ref 135–145)

## 2017-06-21 LAB — URIC ACID: Uric Acid, Serum: 7.1 mg/dL (ref 4.0–7.8)

## 2017-06-21 MED ORDER — GLIMEPIRIDE 1 MG PO TABS: 1.0000 mg | ORAL_TABLET | Freq: Every day | ORAL | 1 refills | Status: DC

## 2017-06-21 MED ORDER — VOLTAREN 1 % TD GEL: 2.0000 g | Freq: Three times a day (TID) | TRANSDERMAL | 1 refills | Status: DC

## 2017-06-21 NOTE — Progress Notes (Signed)
BP 122/72 (BP Location: Left Arm, Patient Position: Sitting, Cuff Size: Large)   Pulse 97   Temp 98.6 F (37 C) (Oral)   Wt 270 lb 8 oz (122.7 kg)   SpO2 95%   BMI 43.66 kg/m    CC: not feeling well Subjective:    Patient ID: Colton Porter, male    DOB: June 24, 1966, 51 y.o.   MRN: [id number]  HPI: Colton Porter is a 51 y.o. male presenting on 06/21/2017 for Polydipsia (Started 06/12/17. Also, c/o fatigue. Has had some vomiting and diarrhea); Blurred Vision; and Discuss medicaiton (Wants to discuss allopurinol)   1+ wk h/o polydipsia, fatigue, blurry vision and vomiting/diarrhea. He has also had diarrhea, body aches, chills. He has also had floaters in vision. No photopsia. Latest eye exam in summer was WNL per pt.  Recent labs at [facility] showed sugar 300s.   Insurance did not cover generic diclofenac gel, request brand voltaren - sent in. Recent prednisone course for gout - completed course Friday.   Relevant past medical, surgical, family and social history reviewed and updated as indicated. Interim medical history since our last visit reviewed. Allergies and medications reviewed and updated. Outpatient Medications Prior to Visit  Medication Sig Dispense Refill  . acetaminophen (TYLENOL) 500 MG tablet Take 500 mg by mouth every 6 (six) hours as needed for mild pain.    Marland Kitchen allopurinol (ZYLOPRIM) 100 MG tablet Take 1 tablet (100 mg total) by mouth daily. Increase to 200mg  daily in 2 weeks 60 tablet 3  . amLODipine (NORVASC) 10 MG tablet Take 1 tablet (10 mg total) by mouth daily. 90 tablet 3  . ASPIR-LOW 81 MG EC tablet Take 1 tablet (81 mg total) by mouth daily. 90 tablet 3  . Cholecalciferol (VITAMIN D3) 2000 units TABS Take 1 tablet by mouth at bedtime.    . docusate sodium (COLACE) 100 MG capsule Take 100 mg by mouth 2 (two) times daily as needed for mild constipation.    Marland Kitchen doxazosin (CARDURA) 8 MG tablet Take 1 tablet (8 mg total) by mouth at bedtime. 90 tablet 3  .  escitalopram (LEXAPRO) 10 MG tablet Take 1 tablet (10 mg total) by mouth daily. 90 tablet 3  . furosemide (LASIX) 40 MG tablet Take 1 tablet (40 mg total) by mouth daily. 90 tablet 3  . loratadine (CLARITIN) 10 MG tablet Take 10 mg by mouth daily.    Marland Kitchen losartan (COZAAR) 50 MG tablet Take 1 tablet (50 mg total) by mouth daily. 90 tablet 3  . Multiple Vitamin (MULTIVITAMIN WITH MINERALS) TABS tablet Take 2 tablets by mouth every evening.    . Omega-3 Fatty Acids (FISH OIL PO) Take 1 tablet by mouth at bedtime.    Marland Kitchen omeprazole (PRILOSEC) 40 MG capsule Take 1 capsule (40 mg total) by mouth daily. 90 capsule 3  . oxyCODONE (ROXICODONE) 5 MG immediate release tablet Take 1-2 tablets (5-10 mg total) by mouth every 4 (four) hours as needed for severe pain. 30 tablet 0  . tacrolimus (PROGRAF) 1 MG capsule Take 1-2 mg by mouth 2 (two) times daily. 2 mg in the morning and 1 mg in the evening    . tamsulosin (FLOMAX) 0.4 MG CAPS capsule Take 2 capsules (0.8 mg total) by mouth daily. 180 capsule 3  . diclofenac sodium (VOLTAREN) 1 % GEL Apply 1 application topically 3 (three) times daily. 1 Tube 1  . predniSONE (DELTASONE) 20 MG tablet Take two tablets daily for 5 days  followed by one tablet daily for 5 days 15 tablet 0   No facility-administered medications prior to visit.      Per HPI unless specifically indicated in ROS section below Review of Systems     Objective:    BP 122/72 (BP Location: Left Arm, Patient Position: Sitting, Cuff Size: Large)   Pulse 97   Temp 98.6 F (37 C) (Oral)   Wt 270 lb 8 oz (122.7 kg)   SpO2 95%   BMI 43.66 kg/m   Wt Readings from Last 3 Encounters:  06/21/17 270 lb 8 oz (122.7 kg)  05/22/17 289 lb (131.1 kg)  04/12/17 286 lb 6.4 oz (129.9 kg)    Physical Exam  Constitutional: He appears well-developed and well-nourished. No distress.  HENT:  Mouth/Throat: Oropharynx is clear and moist. No oropharyngeal exudate.  Eyes: Conjunctivae are normal. Pupils are  equal, round, and reactive to light.  Cardiovascular: Normal rate, regular rhythm, normal heart sounds and intact distal pulses.  No murmur heard. Pulmonary/Chest: Effort normal and breath sounds normal. No respiratory distress. He has no wheezes. He has no rales.  Musculoskeletal: He exhibits no edema.  Skin: Skin is warm and dry. No rash noted.  Psychiatric: He has a normal mood and affect.  Nursing note and vitals reviewed.  Results for orders placed or performed in visit on 06/21/17  Renal function panel  Result Value Ref Range   Sodium 132 (L) 135 - 145 mEq/L   Potassium 4.4 3.5 - 5.1 mEq/L   Chloride 96 96 - 112 mEq/L   CO2 27 19 - 32 mEq/L   Calcium 9.4 8.4 - 10.5 mg/dL   Albumin 4.1 3.5 - 5.2 g/dL   BUN 25 (H) 6 - 23 mg/dL   Creatinine, Ser 1.72 (H) 0.40 - 1.50 mg/dL   Glucose, Bld 448 (H) 70 - 99 mg/dL   Phosphorus 3.4 2.3 - 4.6 mg/dL   GFR 44.82 (L) >60.00 mL/min  Uric acid  Result Value Ref Range   Uric Acid, Serum 7.1 4.0 - 7.8 mg/dL   Lab Results  Component Value Date   HGBA1C 5.8 10/03/2016       Assessment & Plan:  Over 25 minutes were spent face-to-face with the patient during this encounter and >50% of that time was spent on counseling and coordination of care  Problem List Items Addressed This Visit    (HFpEF) heart failure with preserved ejection fraction ([place])    Also limits diabetic meds.       Acute gout    Current flare improved with prednisone course.  Increase allopurinol to 200mg  daily.  Start voltaren gel topically preventatively while we increase allopurinol      CKD (chronic kidney disease) stage 3, GFR 30-59 ml/min (HCC)    Renal function limits antihyperglycemic medications.       Essential hypertension    Chronic, stable. Continue current regimen.       Morbid obesity with BMI of 40.0-44.9, adult (HCC)   Relevant Medications   glimepiride (AMARYL) 1 MG tablet   Status post liver transplant ([place])   Uncontrolled diabetes mellitus  type 2 without complications ([place]) - Primary    New diagnosis - anticipate progression to frank type 2 diabetes with recent sugars 300 and 400s. Anticipate recent steroid courses for gout flares contributing, he has completed latest steroid course. Discussed pathophysiology of diabetes as well as treatment options. Kidney function likely won't tolerate metformin so will start sulfonylurea, advised of hypoglycemia risk and  to take with food. Will likely ultimately need insulin to reach goal control levels.  I've also asked him to check with insurance for preferred glucometer brand, but in interim to start checking sugars with wife's meter, keep log, and bring in to next visit. Will see if we can add A1c to blood in lab.  Discussed risks of hyperglycemia and red flags to seek ER care.  Given blurry vision, I did recommend avoid driving until sugar control has improved.  Reviewed basics of diabetic diet, increased water, avoiding carbs and all sweetened beverages at this time. RTC 1 wk f/u visit.       Relevant Medications   glimepiride (AMARYL) 1 MG tablet       Follow up plan: Return in about 1 week (around 06/28/2017) for follow up visit.  Ria Bush, MD

## 2017-06-21 NOTE — Patient Instructions (Addendum)
Increase water intake. Back off sprite and sweetened beverages. Watch added sugars and back off carbs until sugar control improves.  You are developing diabetes leading to your symptoms.  Start amaryl 1mg  daily with breakfast. This will increase insulin production from the pancreas.  Check which brand insurance prefers and then let me know to send in new glucose meter. If you can, start checking sugars a few times a day - either fasting before a meal or 2 hours after a meal. Keep log of sugars and bring in next time.  Return in next week for follow up visit.  We will eventually need to start insulin.   Type 2 Diabetes Mellitus, Diagnosis, Adult Type 2 diabetes (type 2 diabetes mellitus) is a long-term (chronic) disease. It may be caused by one or both of these problems:  Your body does not make enough of a hormone called insulin.  Your body does not react in a normal way to insulin that it makes.  Insulin lets sugars (glucose) go into cells in the body. This gives you energy. If you have type 2 diabetes, sugars cannot get into cells. This causes high blood sugar (hyperglycemia). Your doctor will set treatment goals for you. Generally, you should have these blood sugar levels:  Before meals (preprandial): 80-130 mg/dL (4.4-7.2 mmol/L).  After meals (postprandial): below 180 mg/dL (10 mmol/L).  A1c (hemoglobin A1c) level: less than 7%.  Follow these instructions at home: Questions to Ask Your Doctor  You may want to ask these questions:  Do I need to meet with a diabetes educator?  Where can I find a support group for people with diabetes?  What equipment will I need to care for myself at home?  What diabetes medicines do I need? When should I take them?  How often do I need to check my blood sugar?  What number can I call if I have questions?  When is my next doctor's visit?  General instructions  Take over-the-counter and prescription medicines only as told by your  doctor.  Keep all follow-up visits as told by your doctor. This is important. Contact a doctor if:  Your blood sugar is at or above 240 mg/dL (13.3 mmol/L) for 2 days in a row.  You have been sick or have had a fever for 2 days or more and you are not getting better.  You have any of these problems for more than 6 hours: ? You cannot eat or drink. ? You feel sick to your stomach (nauseous). ? You throw up (vomit). ? You have watery poop (diarrhea). Get help right away if:  Your blood sugar is lower than 54 mg/dL (3 mmol/L).  You get confused.  You have trouble: ? Thinking clearly. ? Breathing.  You have moderate or large ketone levels in your pee (urine). This information is not intended to replace advice given to you by your health care provider. Make sure you discuss any questions you have with your health care provider. Document Released: 03/22/2008 Document Revised: 11/19/2015 Document Reviewed: 07/17/2015 Elsevier Interactive Patient Education  [employer].

## 2017-06-22 NOTE — Telephone Encounter (Signed)
See triage note from 12/21. Pt F/U

## 2017-06-22 NOTE — Assessment & Plan Note (Signed)
Chronic, stable. Continue current regimen. 

## 2017-06-22 NOTE — Assessment & Plan Note (Addendum)
New diagnosis - anticipate progression to frank type 2 diabetes with recent sugars 300 and 400s. Anticipate recent steroid courses for gout flares contributing, he has completed latest steroid course. Discussed pathophysiology of diabetes as well as treatment options. Kidney function likely won't tolerate metformin so will start sulfonylurea, advised of hypoglycemia risk and to take with food. Will likely ultimately need insulin to reach goal control levels.  I've also asked him to check with insurance for preferred glucometer brand, but in interim to start checking sugars with wife's meter, keep log, and bring in to next visit. Will see if we can add A1c to blood in lab.  Discussed risks of hyperglycemia and red flags to seek ER care.  Given blurry vision, I did recommend avoid driving until sugar control has improved.  Reviewed basics of diabetic diet, increased water, avoiding carbs and all sweetened beverages at this time. RTC 1 wk f/u visit.

## 2017-06-22 NOTE — Assessment & Plan Note (Signed)
Renal function limits antihyperglycemic medications.

## 2017-06-22 NOTE — Assessment & Plan Note (Signed)
Also limits diabetic meds.

## 2017-06-22 NOTE — Assessment & Plan Note (Addendum)
Current flare improved with prednisone course.  Increase allopurinol to 200mg  daily.  Start voltaren gel topically preventatively while we increase allopurinol

## 2017-06-28 ENCOUNTER — Ambulatory Visit: Payer: Medicare HMO | Admitting: Family Medicine

## 2017-06-28 ENCOUNTER — Telehealth: Payer: Self-pay | Admitting: Family Medicine

## 2017-06-28 ENCOUNTER — Encounter: Payer: Self-pay | Admitting: Family Medicine

## 2017-06-28 VITALS — BP 116/70 | HR 85 | Temp 98.2°F | Wt 275.5 lb

## 2017-06-28 DIAGNOSIS — M109 Gout, unspecified: Secondary | ICD-10-CM

## 2017-06-28 DIAGNOSIS — N183 Chronic kidney disease, stage 3 unspecified: Secondary | ICD-10-CM

## 2017-06-28 DIAGNOSIS — Z944 Liver transplant status: Secondary | ICD-10-CM

## 2017-06-28 DIAGNOSIS — E1165 Type 2 diabetes mellitus with hyperglycemia: Secondary | ICD-10-CM

## 2017-06-28 DIAGNOSIS — I5030 Unspecified diastolic (congestive) heart failure: Secondary | ICD-10-CM

## 2017-06-28 DIAGNOSIS — IMO0001 Reserved for inherently not codable concepts without codable children: Secondary | ICD-10-CM

## 2017-06-28 DIAGNOSIS — Z6841 Body Mass Index (BMI) 40.0 and over, adult: Secondary | ICD-10-CM | POA: Diagnosis not present

## 2017-06-28 LAB — BASIC METABOLIC PANEL
BUN: 26 mg/dL — AB (ref 6–23)
CALCIUM: 9.1 mg/dL (ref 8.4–10.5)
CO2: 28 meq/L (ref 19–32)
CREATININE: 1.66 mg/dL — AB (ref 0.40–1.50)
Chloride: 101 mEq/L (ref 96–112)
GFR: 46.69 mL/min — AB (ref >60.00)
Glucose, Bld: 214 mg/dL — ABNORMAL HIGH (ref 70–99)
Potassium: 4.5 mEq/L (ref 3.5–5.1)
SODIUM: 137 meq/L (ref 135–145)

## 2017-06-28 LAB — HEMOGLOBIN A1C: Hgb A1c MFr Bld: 11.5 % — ABNORMAL HIGH (ref 4.6–6.5)

## 2017-06-28 LAB — GLUCOSE, POCT (MANUAL RESULT ENTRY): POC Glucose: 260 mg/dl — AB (ref 70–99)

## 2017-06-28 MED ORDER — ALLOPURINOL 100 MG PO TABS: 200.0000 mg | ORAL_TABLET | Freq: Every day | ORAL | 3 refills | Status: DC

## 2017-06-28 MED ORDER — INSULIN GLARGINE 100 UNIT/ML SOLOSTAR PEN: 15.0000 [IU] | PEN_INJECTOR | Freq: Every day | SUBCUTANEOUS | 3 refills | Status: DC

## 2017-06-28 NOTE — Progress Notes (Signed)
BP 116/70 (BP Location: Right Arm, Patient Position: Sitting, Cuff Size: Large)   Pulse 85   Temp 98.2 F (36.8 C) (Oral)   Wt 275 lb 8 oz (125 kg)   SpO2 97%   BMI 44.47 kg/m    CC: 1 wk f/u visit Subjective:    Patient ID: Colton Porter, male    DOB: Jul 28, 1965, 52 y.o.   MRN: [id number]  HPI: Colton Porter is a 52 y.o. male presenting on 06/28/2017 for Follow-up (1week ) and Diabetes   Here with sister today.  See prior note for details.  New dx last week uncontrolled DM with sugars 300-400s in setting of recent prednisone courses for gout flares. We started amaryl 1mg  daily. He's checked sugars only a few times this past week - staying 300s.  Vision is slowly improving. Continues drinking plenty of water.  Has questions about diabetic diet.   Relevant past medical, surgical, family and social history reviewed and updated as indicated. Interim medical history since our last visit reviewed. Allergies and medications reviewed and updated. Outpatient Medications Prior to Visit  Medication Sig Dispense Refill  . acetaminophen (TYLENOL) 500 MG tablet Take 500 mg by mouth every 6 (six) hours as needed for mild pain.    Marland Kitchen amLODipine (NORVASC) 10 MG tablet Take 1 tablet (10 mg total) by mouth daily. 90 tablet 3  . ASPIR-LOW 81 MG EC tablet Take 1 tablet (81 mg total) by mouth daily. 90 tablet 3  . Cholecalciferol (VITAMIN D3) 2000 units TABS Take 1 tablet by mouth at bedtime.    . docusate sodium (COLACE) 100 MG capsule Take 100 mg by mouth 2 (two) times daily as needed for mild constipation.    Marland Kitchen doxazosin (CARDURA) 8 MG tablet Take 1 tablet (8 mg total) by mouth at bedtime. 90 tablet 3  . escitalopram (LEXAPRO) 10 MG tablet Take 1 tablet (10 mg total) by mouth daily. 90 tablet 3  . furosemide (LASIX) 40 MG tablet Take 1 tablet (40 mg total) by mouth daily. 90 tablet 3  . glimepiride (AMARYL) 1 MG tablet Take 1 tablet (1 mg total) by mouth daily with breakfast. 90 tablet 1  .  loratadine (CLARITIN) 10 MG tablet Take 10 mg by mouth daily.    Marland Kitchen losartan (COZAAR) 50 MG tablet Take 1 tablet (50 mg total) by mouth daily. 90 tablet 3  . Multiple Vitamin (MULTIVITAMIN WITH MINERALS) TABS tablet Take 2 tablets by mouth every evening.    . Omega-3 Fatty Acids (FISH OIL PO) Take 1 tablet by mouth at bedtime.    Marland Kitchen omeprazole (PRILOSEC) 40 MG capsule Take 1 capsule (40 mg total) by mouth daily. 90 capsule 3  . oxyCODONE (ROXICODONE) 5 MG immediate release tablet Take 1-2 tablets (5-10 mg total) by mouth every 4 (four) hours as needed for severe pain. 30 tablet 0  . tacrolimus (PROGRAF) 1 MG capsule Take 1-2 mg by mouth 2 (two) times daily. 2 mg in the morning and 1 mg in the evening    . tamsulosin (FLOMAX) 0.4 MG CAPS capsule Take 2 capsules (0.8 mg total) by mouth daily. 180 capsule 3  . VOLTAREN 1 % GEL Apply 2 g topically 3 (three) times daily. 100 g 1  . allopurinol (ZYLOPRIM) 100 MG tablet Take 1 tablet (100 mg total) by mouth daily. Increase to 200mg  daily in 2 weeks 60 tablet 3   No facility-administered medications prior to visit.      Per  HPI unless specifically indicated in ROS section below Review of Systems     Objective:    BP 116/70 (BP Location: Right Arm, Patient Position: Sitting, Cuff Size: Large)   Pulse 85   Temp 98.2 F (36.8 C) (Oral)   Wt 275 lb 8 oz (125 kg)   SpO2 97%   BMI 44.47 kg/m   Wt Readings from Last 3 Encounters:  06/28/17 275 lb 8 oz (125 kg)  06/21/17 270 lb 8 oz (122.7 kg)  05/22/17 289 lb (131.1 kg)    Physical Exam  Constitutional: He appears well-developed and well-nourished. No distress.  HENT:  Mouth/Throat: Oropharynx is clear and moist. No oropharyngeal exudate.  Cardiovascular: Normal rate, regular rhythm, normal heart sounds and intact distal pulses.  No murmur heard. Pulmonary/Chest: Effort normal and breath sounds normal. No respiratory distress. He has no wheezes. He has no rales.  Musculoskeletal: He exhibits  no edema.  Psychiatric: He has a normal mood and affect.  Nursing note and vitals reviewed.  Results for orders placed or performed in visit on 68/34/19  Basic metabolic panel  Result Value Ref Range   Sodium 137 135 - 145 mEq/L   Potassium 4.5 3.5 - 5.1 mEq/L   Chloride 101 96 - 112 mEq/L   CO2 28 19 - 32 mEq/L   Glucose, Bld 214 (H) 70 - 99 mg/dL   BUN 26 (H) 6 - 23 mg/dL   Creatinine, Ser 1.66 (H) 0.40 - 1.50 mg/dL   Calcium 9.1 8.4 - 10.5 mg/dL   GFR 46.69 (L) >60.00 mL/min  Hemoglobin A1c  Result Value Ref Range   Hgb A1c MFr Bld 11.5 (H) 4.6 - 6.5 %  POCT glucose (manual entry)  Result Value Ref Range   POC Glucose 260 (A) 70 - 99 mg/dl       Assessment & Plan:   Problem List Items Addressed This Visit    (HFpEF) heart failure with preserved ejection fraction (HCC)   Acute gout   CKD (chronic kidney disease) stage 3, GFR 30-59 ml/min (HCC)   Morbid obesity with BMI of 40.0-44.9, adult (HCC)   Relevant Medications   Insulin Glargine (LANTUS SOLOSTAR) 100 UNIT/ML Solostar Pen   Status post liver transplant ([place])   Uncontrolled diabetes mellitus type 2 without complications ([place]) - Primary    New diagnosis.  Answered questions regarding diet.  Waiting on preferred brand for glucose meter and strips.  Comorbidities limit oral medications. Will start lantus 15 units daily, reviewed insulin administration. States wife could administer shots as she is insulin dependent diabetic.  Refer for diabetes education. RTC 2 wks f/u visit with sugar log.       Relevant Medications   Insulin Glargine (LANTUS SOLOSTAR) 100 UNIT/ML Solostar Pen   Other Relevant Orders   Basic metabolic panel (Completed)   Hemoglobin A1c (Completed)   Ambulatory referral to diabetic education   POCT glucose (manual entry) (Completed)       Follow up plan: Return in about 2 weeks (around 07/12/2017) for follow up visit.  Ria Bush, MD

## 2017-06-28 NOTE — Telephone Encounter (Signed)
Copied from [place] [id number]. Topic: Quick Communication - See Telephone Encounter >> Jun 28, 2017  2:26 PM Ether Griffins B wrote: CRM for notification. See Telephone encounter for:  [facility] request on [facility]26 for a diabetic meter for the pt. Insurance will cover the accu check smart meter or accu check guide. Pharmacy hasnt heard back from office 06/28/17.

## 2017-06-28 NOTE — Patient Instructions (Addendum)
We will refer you to diabetes education classes at [facility] (see [place] [place]).  Start lantus 15 units at bedtime.  Return in 2 weeks for follow up visit.  Look into low glycemic index foods.  cbg today. Labs today.

## 2017-06-29 MED ORDER — INSULIN PEN NEEDLE 31G X 5 MM MISC: 3 refills | Status: DC

## 2017-06-29 MED ORDER — LANCETS ULTRA THIN 30G MISC: 11 refills | Status: DC

## 2017-06-29 MED ORDER — ACCU-CHEK GUIDE W/DEVICE KIT: 1.0000 [IU] | PACK | 0 refills | Status: AC

## 2017-06-29 MED ORDER — GLUCOSE BLOOD VI STRP: ORAL_STRIP | 11 refills | Status: DC

## 2017-06-29 NOTE — Telephone Encounter (Signed)
[facility] calling back again today and states pt did not receive the lancets or the needles to inject the insulin. Need these called in to the pharmacy.

## 2017-06-29 NOTE — Telephone Encounter (Signed)
Spoke with [facility] [address], says they received rxs for Accu-Chek device kit and strips.  However, pt needs rx for pen needles for Lantus and lancets.  Both come in 100 ct boxes.

## 2017-06-29 NOTE — Assessment & Plan Note (Signed)
New diagnosis.  Answered questions regarding diet.  Waiting on preferred brand for glucose meter and strips.  Comorbidities limit oral medications. Will start lantus 15 units daily, reviewed insulin administration. States wife could administer shots as she is insulin dependent diabetic.  Refer for diabetes education. RTC 2 wks f/u visit with sugar log.

## 2017-06-29 NOTE — Telephone Encounter (Signed)
Spoke with [facility] [place] inquiring about initial requests for lancets and pen needles.  Says the requests were faxed.  Confirmed both rxs were received and they will get filled for pt.

## 2017-06-29 NOTE — Telephone Encounter (Signed)
Sent in. plz check with [facility] - how exactly did they place this request? No records of ever receiving diabetic meter request, pt states he did not hear back from them either. However they're doing it please stop as we aren't getting the request when done this way. Was this electronic request or was this through [facility] and I never got message?

## 2017-06-29 NOTE — Telephone Encounter (Signed)
plz check how they sent in initial accuchek request as I never received.  I have sent in pen needles and lancets.

## 2017-07-11 ENCOUNTER — Ambulatory Visit: Payer: Medicare HMO | Admitting: Adult Health

## 2017-07-11 ENCOUNTER — Encounter: Payer: Self-pay | Admitting: Adult Health

## 2017-07-11 VITALS — BP 140/78 | HR 70 | Ht 67.0 in | Wt 277.4 lb

## 2017-07-11 DIAGNOSIS — G4733 Obstructive sleep apnea (adult) (pediatric): Secondary | ICD-10-CM

## 2017-07-11 DIAGNOSIS — Z6841 Body Mass Index (BMI) 40.0 and over, adult: Secondary | ICD-10-CM

## 2017-07-11 DIAGNOSIS — Z9989 Dependence on other enabling machines and devices: Secondary | ICD-10-CM

## 2017-07-11 NOTE — Assessment & Plan Note (Signed)
Wt loss  

## 2017-07-11 NOTE — Progress Notes (Signed)
Reviewed, agree 

## 2017-07-11 NOTE — Progress Notes (Signed)
$'@Patient'B$  ID: Colton Porter, male    DOB: [birth year], 52 y.o.   MRN: [id number]  Chief Complaint  Patient presents with  . Follow-up    OSA     Referring provider: Ria Bush, MD  HPI: 52 year old male former smoker followed for obstructive sleep apnea on CPAP. Status post liver transplant July 8657 for alcoholic cirrhosis. Past medical history positive for congestive heart failure and chronic kidney disease   TEST  HST 07/2013:  AHI 39/hr.     07/11/2017 Follow up : OSA  Patient presents for a follow-up for sleep apnea.  Patient is on CPAP with 2 l/m O2 at bedtime.  Patient says he feels rested with no significant daytime sleepiness.  he wears his machine each night for about 8 hours.  Download shows excellent compliance with average usage at 9 hours.  Patient is on CPAP AutoSet 5-20 cm H2O.  AHI is 2.1.  Minimal leaks.[place] and DME require CPAP titration study to approve oxygen. Pt just had sleep study 2015 that showed severe sleep apnea with AHI 39.  He does not wish to have a repeat sleep study.   Allergies  Allergen Reactions  . Ambien [Zolpidem Tartrate] Other (See Comments)    Over-toxicity from liver failure  . Morphine And Related     Hallucinations.  . Hydrochlorothiazide W-Triamterene Other (See Comments)    REACTION: dizzy, nausea  . Lisinopril Other (See Comments)    REACTION: cough, decreased libido    Immunization History  Administered Date(s) Administered  . Hep A / Hep B 01/16/2015, 01/23/2015, 02/06/2015  . Hepatitis B, adult 12/04/2015  . Influenza Whole 03/24/2010  . Influenza,inj,Quad PF,6+ Mos 04/22/2015, 04/06/2017  . Pneumococcal Polysaccharide-23 04/22/2015  . Td 05/01/2007  . Tdap 12/14/2015    Past Medical History:  Diagnosis Date  . Alcohol dependence ([place]) 07/11/2012  . Alcoholic cirrhosis of liver with ascites ([place]) 07/2014   s./p transplant 12/2015  . Allergy   . Anemia   . Cellulitis of left leg   . Chronic  diastolic heart failure ([place]) 11/01/2013  . CKD (chronic kidney disease) stage 3, GFR 30-59 ml/min (HCC) 11/04/2015  . GERD (gastroesophageal reflux disease)   . Hyperlipidemia   . Hypertension   . Neuropathy   . OSA on CPAP 07/11/2012   HST 07/2013:  AHI 39/hr.    . Thrombocytopenia ([place]) 12/15/2011    Tobacco History: Social History   Tobacco Use  Smoking Status Never Smoker  Smokeless Tobacco Former Systems developer  . Types: Chew   Counseling given: Not Answered   Outpatient Encounter Medications as of 07/11/2017  Medication Sig  . acetaminophen (TYLENOL) 500 MG tablet Take 500 mg by mouth every 6 (six) hours as needed for mild pain.  Marland Kitchen allopurinol (ZYLOPRIM) 100 MG tablet Take 2 tablets (200 mg total) by mouth daily.  Marland Kitchen amLODipine (NORVASC) 10 MG tablet Take 1 tablet (10 mg total) by mouth daily.  . ASPIR-LOW 81 MG EC tablet Take 1 tablet (81 mg total) by mouth daily.  . Blood Glucose Monitoring Suppl (ACCU-CHEK GUIDE) w/Device KIT 1 Units by Does not apply route as directed.  . Cholecalciferol (VITAMIN D3) 2000 units TABS Take 1 tablet by mouth at bedtime.  . docusate sodium (COLACE) 100 MG capsule Take 100 mg by mouth 2 (two) times daily as needed for mild constipation.  Marland Kitchen doxazosin (CARDURA) 8 MG tablet Take 1 tablet (8 mg total) by mouth at bedtime.  Marland Kitchen escitalopram (LEXAPRO) 10  MG tablet Take 1 tablet (10 mg total) by mouth daily.  . furosemide (LASIX) 40 MG tablet Take 1 tablet (40 mg total) by mouth daily.  Marland Kitchen glimepiride (AMARYL) 1 MG tablet Take 1 tablet (1 mg total) by mouth daily with breakfast.  . glucose blood (ACCU-CHEK GUIDE) test strip Check sugars three times daily and as needed when feeling ill E11.65, insulin use  . Insulin Glargine (LANTUS SOLOSTAR) 100 UNIT/ML Solostar Pen Inject 15 Units into the skin daily at 10 pm.  . Insulin Pen Needle (B-D UF III MINI PEN NEEDLES) 31G X 5 MM MISC Use to inject insulin daily  . LANCETS ULTRA THIN 30G MISC Check sugars three times daily  and as needed when feeling ill E11.65, insulin use  . loratadine (CLARITIN) 10 MG tablet Take 10 mg by mouth daily.  Marland Kitchen losartan (COZAAR) 50 MG tablet Take 1 tablet (50 mg total) by mouth daily.  . Multiple Vitamin (MULTIVITAMIN WITH MINERALS) TABS tablet Take 2 tablets by mouth every evening.  . Omega-3 Fatty Acids (FISH OIL PO) Take 1 tablet by mouth at bedtime.  Marland Kitchen omeprazole (PRILOSEC) 40 MG capsule Take 1 capsule (40 mg total) by mouth daily.  . tacrolimus (PROGRAF) 1 MG capsule Take 1-2 mg by mouth 2 (two) times daily. 2 mg in the morning and 1 mg in the evening  . tamsulosin (FLOMAX) 0.4 MG CAPS capsule Take 2 capsules (0.8 mg total) by mouth daily.  Marland Kitchen oxyCODONE (ROXICODONE) 5 MG immediate release tablet Take 1-2 tablets (5-10 mg total) by mouth every 4 (four) hours as needed for severe pain. (Patient not taking: Reported on 07/11/2017)  . VOLTAREN 1 % GEL Apply 2 g topically 3 (three) times daily. (Patient not taking: Reported on 07/11/2017)   No facility-administered encounter medications on file as of 07/11/2017.      Review of Systems  Constitutional:   No  weight loss, night sweats,  Fevers, chills, fatigue, or  lassitude.  HEENT:   No headaches,  Difficulty swallowing,  Tooth/dental problems, or  Sore throat,                No sneezing, itching, ear ache, nasal congestion, post nasal drip,   CV:  No chest pain,  Orthopnea, PND, swelling in lower extremities, anasarca, dizziness, palpitations, syncope.   GI  No heartburn, indigestion, abdominal pain, nausea, vomiting, diarrhea, change in bowel habits, loss of appetite, bloody stools.   Resp: No shortness of breath with exertion or at rest.  No excess mucus, no productive cough,  No non-productive cough,  No coughing up of blood.  No change in color of mucus.  No wheezing.  No chest wall deformity  Skin: no rash or lesions.  GU: no dysuria, change in color of urine, no urgency or frequency.  No flank pain, no hematuria   MS:  No  joint pain or swelling.  No decreased range of motion.  No back pain.    Physical Exam  BP 140/78 (BP Location: Left Arm, Cuff Size: Normal)   Pulse 70   Ht '5\' 7"'$  (1.702 m)   Wt 277 lb 6.4 oz (125.8 kg)   SpO2 98%   BMI 43.45 kg/m   GEN: A/Ox3; pleasant , NAD, Obese    HEENT:  [place]/AT,  EACs-clear, TMs-wnl, NOSE-clear, THROAT-clear, no lesions, no postnasal drip or exudate noted.  Class 2-3 MP airway   NECK:  Supple w/ fair ROM; no JVD; normal carotid impulses w/o bruits; no thyromegaly  or nodules palpated; no lymphadenopathy.    RESP  Clear  P & A; w/o, wheezes/ rales/ or rhonchi. no accessory muscle use, no dullness to percussion  CARD:  RRR, no m/r/g, no peripheral edema, pulses intact, no cyanosis or clubbing.  GI:   Soft & nt; nml bowel sounds; no organomegaly or masses detected.   Musco: Warm bil, no deformities or joint swelling noted.   Neuro: alert, no focal deficits noted.    Skin: Warm, no lesions or rashes    Lab Results:  CBC  BMET  BNP  ProBNP No results found for: PROBNP  Imaging: No results found.   Assessment & Plan:   OSA on CPAP Compensated on CPAP with oxygen Overnight oximetry test on room air with CPAP to see if oxygen is still required.  Patient says if it shows significant hypoxemia on CPAP will consider a CPAP titration study per requirements of his insurance company  Plan  Patient Instructions  Continue on CPAP Keep up the good work Work on [employer] Do not drive a sleepy Set up overnight oximetry test on CPAP on room air Follow-up with Dr. Lake Bells in 1 year and as needed    Morbid obesity with BMI of 40.0-44.9, adult ([place]) Wt loss      Rexene Edison, NP 07/11/2017

## 2017-07-11 NOTE — Patient Instructions (Signed)
Continue on CPAP Keep up the good work Work on [employer] Do not drive a sleepy Set up overnight oximetry test on CPAP on room air Follow-up with Dr. Lake Bells in 1 year and as needed

## 2017-07-11 NOTE — Assessment & Plan Note (Signed)
Compensated on CPAP with oxygen Overnight oximetry test on room air with CPAP to see if oxygen is still required.  Patient says if it shows significant hypoxemia on CPAP will consider a CPAP titration study per requirements of his insurance company  Plan  Patient Instructions  Continue on CPAP Keep up the good work Work on [employer] Do not drive a sleepy Set up overnight oximetry test on CPAP on room air Follow-up with Dr. Lake Bells in 1 year and as needed

## 2017-07-12 ENCOUNTER — Ambulatory Visit: Payer: Medicare HMO | Admitting: Family Medicine

## 2017-07-12 ENCOUNTER — Encounter: Payer: Self-pay | Admitting: Family Medicine

## 2017-07-12 VITALS — BP 120/60 | HR 80 | Temp 98.1°F | Wt 280.0 lb

## 2017-07-12 DIAGNOSIS — Z944 Liver transplant status: Secondary | ICD-10-CM

## 2017-07-12 DIAGNOSIS — I5030 Unspecified diastolic (congestive) heart failure: Secondary | ICD-10-CM | POA: Diagnosis not present

## 2017-07-12 DIAGNOSIS — H539 Unspecified visual disturbance: Secondary | ICD-10-CM

## 2017-07-12 DIAGNOSIS — Z6841 Body Mass Index (BMI) 40.0 and over, adult: Secondary | ICD-10-CM | POA: Diagnosis not present

## 2017-07-12 DIAGNOSIS — I10 Essential (primary) hypertension: Secondary | ICD-10-CM | POA: Diagnosis not present

## 2017-07-12 DIAGNOSIS — E1165 Type 2 diabetes mellitus with hyperglycemia: Secondary | ICD-10-CM | POA: Diagnosis not present

## 2017-07-12 DIAGNOSIS — N183 Chronic kidney disease, stage 3 unspecified: Secondary | ICD-10-CM

## 2017-07-12 DIAGNOSIS — IMO0001 Reserved for inherently not codable concepts without codable children: Secondary | ICD-10-CM

## 2017-07-12 NOTE — Assessment & Plan Note (Addendum)
New diagnosis last month.  Significant improvement based on recall cbgs.  Continue current regimen of amaryl with breakfast, lantus 15u at night time.  Will refer to diabetes education at [facility].  Ongoing visual disturbance despite improvement in sugar control - will refer to ophtho.

## 2017-07-12 NOTE — Patient Instructions (Addendum)
See Colton Porter today for referral to eye doctor and diabetes classes.  Try to fit some routine walking in. Return in 3 weeks for labs and 1 month for office visit.

## 2017-07-12 NOTE — Assessment & Plan Note (Signed)
Encouraged he incorporate regular walking into routine.

## 2017-07-12 NOTE — Progress Notes (Signed)
BP 120/60 (BP Location: Left Arm, Patient Position: Sitting, Cuff Size: Large)   Pulse 80   Temp 98.1 F (36.7 C) (Oral)   Wt 280 lb (127 kg)   SpO2 94%   BMI 43.85 kg/m    CC: DM f/u visit 2 wks Subjective:    Patient ID: Colton Porter, male    DOB: 1966-01-01, 52 y.o.   MRN: [id number]  HPI: Colton Porter is a 52 y.o. male presenting on 07/12/2017 for Diabetes (2 wk follow-up. BS yesterday morning, 126. Still c/o blurred vision)   No regular exercise. Some slips with diabetic diet.  DM - does regularly check sugars QOD fasting in the mornings: 120-130s. Compliant with antihyperglycemic regimen which includes: amaryl 41m daily, lantus 15u at bedtime. Denies low sugars or hypoglycemic symptoms. Denies paresthesias. Ongoing blurry vision, some floaters, no photopsia, some halos around lights at night, no cloudy vision, no transient vision loss. Having headaches as well. Last diabetic eye exam DUE - scheduled 08/04/2017. Pneumovax: 03/2015. Prevnar: not due. Glucometer brand: accu-chek. DSME: referred.  Lab Results  Component Value Date   HGBA1C 11.5 (H) 06/28/2017   Diabetic Foot Exam - Simple   No data filed     Lab Results  Component Value Date   MICROALBUR <0.7 08/27/2014     Relevant past medical, surgical, family and social history reviewed and updated as indicated. Interim medical history since our last visit reviewed. Allergies and medications reviewed and updated. Outpatient Medications Prior to Visit  Medication Sig Dispense Refill  . acetaminophen (TYLENOL) 500 MG tablet Take 500 mg by mouth every 6 (six) hours as needed for mild pain.    .Marland Kitchenallopurinol (ZYLOPRIM) 100 MG tablet Take 2 tablets (200 mg total) by mouth daily. 180 tablet 3  . amLODipine (NORVASC) 10 MG tablet Take 1 tablet (10 mg total) by mouth daily. 90 tablet 3  . ASPIR-LOW 81 MG EC tablet Take 1 tablet (81 mg total) by mouth daily. 90 tablet 3  . Blood Glucose Monitoring Suppl (ACCU-CHEK GUIDE)  w/Device KIT 1 Units by Does not apply route as directed. 1 kit 0  . Cholecalciferol (VITAMIN D3) 2000 units TABS Take 1 tablet by mouth at bedtime.    . docusate sodium (COLACE) 100 MG capsule Take 100 mg by mouth 2 (two) times daily as needed for mild constipation.    .Marland Kitchendoxazosin (CARDURA) 8 MG tablet Take 1 tablet (8 mg total) by mouth at bedtime. 90 tablet 3  . escitalopram (LEXAPRO) 10 MG tablet Take 1 tablet (10 mg total) by mouth daily. 90 tablet 3  . furosemide (LASIX) 40 MG tablet Take 1 tablet (40 mg total) by mouth daily. 90 tablet 3  . glimepiride (AMARYL) 1 MG tablet Take 1 tablet (1 mg total) by mouth daily with breakfast. 90 tablet 1  . glucose blood (ACCU-CHEK GUIDE) test strip Check sugars three times daily and as needed when feeling ill E11.65, insulin use 100 each 11  . Insulin Glargine (LANTUS SOLOSTAR) 100 UNIT/ML Solostar Pen Inject 15 Units into the skin daily at 10 pm. 2 pen 3  . Insulin Pen Needle (B-D UF III MINI PEN NEEDLES) 31G X 5 MM MISC Use to inject insulin daily 100 each 3  . LANCETS ULTRA THIN 30G MISC Check sugars three times daily and as needed when feeling ill E11.65, insulin use 100 each 11  . loratadine (CLARITIN) 10 MG tablet Take 10 mg by mouth daily.    .Marland Kitchen  losartan (COZAAR) 50 MG tablet Take 1 tablet (50 mg total) by mouth daily. 90 tablet 3  . Multiple Vitamin (MULTIVITAMIN WITH MINERALS) TABS tablet Take 2 tablets by mouth every evening.    . Omega-3 Fatty Acids (FISH OIL PO) Take 1 tablet by mouth at bedtime.    Marland Kitchen omeprazole (PRILOSEC) 40 MG capsule Take 1 capsule (40 mg total) by mouth daily. 90 capsule 3  . oxyCODONE (ROXICODONE) 5 MG immediate release tablet Take 1-2 tablets (5-10 mg total) by mouth every 4 (four) hours as needed for severe pain. 30 tablet 0  . tacrolimus (PROGRAF) 1 MG capsule Take 1-2 mg by mouth 2 (two) times daily. 2 mg in the morning and 1 mg in the evening    . tamsulosin (FLOMAX) 0.4 MG CAPS capsule Take 2 capsules (0.8 mg  total) by mouth daily. 180 capsule 3  . VOLTAREN 1 % GEL Apply 2 g topically 3 (three) times daily. 100 g 1   No facility-administered medications prior to visit.      Per HPI unless specifically indicated in ROS section below Review of Systems     Objective:    BP 120/60 (BP Location: Left Arm, Patient Position: Sitting, Cuff Size: Large)   Pulse 80   Temp 98.1 F (36.7 C) (Oral)   Wt 280 lb (127 kg)   SpO2 94%   BMI 43.85 kg/m   Wt Readings from Last 3 Encounters:  07/12/17 280 lb (127 kg)  07/11/17 277 lb 6.4 oz (125.8 kg)  06/28/17 275 lb 8 oz (125 kg)    Physical Exam  Constitutional: He appears well-developed and well-nourished. No distress.  HENT:  Head: Normocephalic and atraumatic.  Right Ear: External ear normal.  Left Ear: External ear normal.  Nose: Nose normal.  Mouth/Throat: Oropharynx is clear and moist. No oropharyngeal exudate.  Eyes: Conjunctivae and EOM are normal. Pupils are equal, round, and reactive to light. No scleral icterus.  Neck: Normal range of motion. Neck supple.  Cardiovascular: Normal rate, regular rhythm, normal heart sounds and intact distal pulses.  No murmur heard. Pulmonary/Chest: Effort normal and breath sounds normal. No respiratory distress. He has no wheezes. He has no rales.  Musculoskeletal: He exhibits no edema.  See HPI for foot exam if done  Lymphadenopathy:    He has no cervical adenopathy.  Skin: Skin is warm and dry. No rash noted.  Psychiatric: He has a normal mood and affect.  Nursing note and vitals reviewed.  Results for orders placed or performed in visit on 02/72/53  Basic metabolic panel  Result Value Ref Range   Sodium 137 135 - 145 mEq/L   Potassium 4.5 3.5 - 5.1 mEq/L   Chloride 101 96 - 112 mEq/L   CO2 28 19 - 32 mEq/L   Glucose, Bld 214 (H) 70 - 99 mg/dL   BUN 26 (H) 6 - 23 mg/dL   Creatinine, Ser 1.66 (H) 0.40 - 1.50 mg/dL   Calcium 9.1 8.4 - 10.5 mg/dL   GFR 46.69 (L) >60.00 mL/min  Hemoglobin  A1c  Result Value Ref Range   Hgb A1c MFr Bld 11.5 (H) 4.6 - 6.5 %  POCT glucose (manual entry)  Result Value Ref Range   POC Glucose 260 (A) 70 - 99 mg/dl      Assessment & Plan:   Problem List Items Addressed This Visit    (HFpEF) heart failure with preserved ejection fraction (HCC)   CKD (chronic kidney disease) stage 3, GFR  30-59 ml/min ([place])   Essential hypertension   Morbid obesity with BMI of 40.0-44.9, adult ([place])    Encouraged he incorporate regular walking into routine.       Status post liver transplant ([place])   Uncontrolled diabetes mellitus type 2 without complications ([place]) - Primary    New diagnosis last month.  Significant improvement based on recall cbgs.  Continue current regimen of amaryl with breakfast, lantus 15u at night time.  Will refer to diabetes education at [facility].  Ongoing visual disturbance despite improvement in sugar control - will refer to ophtho.       Relevant Orders   Ambulatory referral to diabetic education   Ambulatory referral to Ophthalmology    Other Visit Diagnoses    Visual disturbance       Relevant Orders   Ambulatory referral to Ophthalmology       Follow up plan: Return in about 4 weeks (around 08/09/2017) for follow up visit.  Ria Bush, MD

## 2017-07-13 DIAGNOSIS — E119 Type 2 diabetes mellitus without complications: Secondary | ICD-10-CM | POA: Diagnosis not present

## 2017-07-13 LAB — HM DIABETES EYE EXAM

## 2017-07-14 ENCOUNTER — Encounter: Payer: Self-pay | Admitting: Family Medicine

## 2017-07-14 DIAGNOSIS — Z4823 Encounter for aftercare following liver transplant: Secondary | ICD-10-CM | POA: Diagnosis not present

## 2017-07-14 DIAGNOSIS — B259 Cytomegaloviral disease, unspecified: Secondary | ICD-10-CM | POA: Diagnosis not present

## 2017-07-14 DIAGNOSIS — Z944 Liver transplant status: Secondary | ICD-10-CM | POA: Diagnosis not present

## 2017-07-16 DIAGNOSIS — J449 Chronic obstructive pulmonary disease, unspecified: Secondary | ICD-10-CM | POA: Diagnosis not present

## 2017-07-16 DIAGNOSIS — R0902 Hypoxemia: Secondary | ICD-10-CM | POA: Diagnosis not present

## 2017-07-18 ENCOUNTER — Telehealth: Payer: Self-pay | Admitting: Adult Health

## 2017-07-18 DIAGNOSIS — G4733 Obstructive sleep apnea (adult) (pediatric): Secondary | ICD-10-CM

## 2017-07-18 DIAGNOSIS — Z9989 Dependence on other enabling machines and devices: Principal | ICD-10-CM

## 2017-07-18 NOTE — Telephone Encounter (Signed)
Pt is calling back [phone]

## 2017-07-18 NOTE — Telephone Encounter (Signed)
Spoke with pt, aware of results/recs.  Pt verified that ono was on cpap only with no O2.  Order placed to d/c nocturnal O2.  Nothing further needed.

## 2017-07-18 NOTE — Telephone Encounter (Signed)
Pt seen by TP 0.99.83 to recertify for nocturnal O2 ONO on room air on CPAP ordered  Results received and reviewed by TP: study shows no significant desats on CPAP alone.  Would like to ensure that he did indeed disconnect his O2 from his CPAP for this test.  If so, may d/c O2 at bedtime.  LMOM TCB x1 to discuss with patient ONO sent for scan

## 2017-07-19 ENCOUNTER — Encounter: Payer: Self-pay | Admitting: Family Medicine

## 2017-07-25 ENCOUNTER — Ambulatory Visit (INDEPENDENT_AMBULATORY_CARE_PROVIDER_SITE_OTHER): Payer: Medicare HMO | Admitting: Family Medicine

## 2017-07-25 ENCOUNTER — Encounter: Payer: Self-pay | Admitting: Family Medicine

## 2017-07-25 VITALS — BP 122/60 | HR 89 | Temp 98.9°F | Wt 279.0 lb

## 2017-07-25 DIAGNOSIS — J069 Acute upper respiratory infection, unspecified: Secondary | ICD-10-CM

## 2017-07-25 DIAGNOSIS — B9789 Other viral agents as the cause of diseases classified elsewhere: Secondary | ICD-10-CM

## 2017-07-25 LAB — POC INFLUENZA A&B (BINAX/QUICKVUE)
INFLUENZA A, POC: NEGATIVE
Influenza B, POC: NEGATIVE

## 2017-07-25 NOTE — Patient Instructions (Addendum)
Flu swab today negative You have a viral upper respiratory infection. Antibiotics are not needed for this.  Viral infections usually take 7-10 days to resolve.  The cough can last a few weeks to go away. May take plain guaifenesin with plenty of water to mobilize mucous. May take tylenol for discomfort.  Push fluids and plenty of rest. Please return if you are not improving as expected, or if you have high fevers (>101.5) or difficulty swallowing or worsening productive cough. Call clinic with questions.  Good to see you today. I hope you start feeling better soon.

## 2017-07-25 NOTE — Progress Notes (Signed)
BP 122/60 (BP Location: Right Arm, Patient Position: Sitting, Cuff Size: Large)   Pulse 89   Temp 98.9 F (37.2 C) (Oral)   Wt 279 lb (126.6 kg)   SpO2 95%   BMI 43.70 kg/m    CC: sinus congestion Subjective:    Patient ID: Colton Porter, male    DOB: 12-15-1965, 52 y.o.   MRN: [id number]  HPI: Colton Porter is a 52 y.o. male presenting on 07/25/2017 for Sinus Problem (head aches, body aches, congestion,chills, took Tylenol but no relief.  started 07/23/17 with yesterday being the worst of it.)   2d h/o HA, body aches, congestion, cough, feverish with chills. Some nausea. Head > chest congestion. PNdrainage. This morning felt a bit better.   No dyspnea, sore throat, wheezing.  Treating with tylenol without improvement - last dose was 24 hrs ago.  Wife sick as well.  No h/o asthma. Non smoker He did receive flu shot this year.   Relevant past medical, surgical, family and social history reviewed and updated as indicated. Interim medical history since our last visit reviewed. Allergies and medications reviewed and updated. Outpatient Medications Prior to Visit  Medication Sig Dispense Refill  . acetaminophen (TYLENOL) 500 MG tablet Take 500 mg by mouth every 6 (six) hours as needed for mild pain.    Marland Kitchen allopurinol (ZYLOPRIM) 100 MG tablet Take 2 tablets (200 mg total) by mouth daily. 180 tablet 3  . amLODipine (NORVASC) 10 MG tablet Take 1 tablet (10 mg total) by mouth daily. 90 tablet 3  . ASPIR-LOW 81 MG EC tablet Take 1 tablet (81 mg total) by mouth daily. 90 tablet 3  . Blood Glucose Monitoring Suppl (ACCU-CHEK GUIDE) w/Device KIT 1 Units by Does not apply route as directed. 1 kit 0  . Cholecalciferol (VITAMIN D3) 2000 units TABS Take 1 tablet by mouth at bedtime.    . docusate sodium (COLACE) 100 MG capsule Take 100 mg by mouth 2 (two) times daily as needed for mild constipation.    Marland Kitchen doxazosin (CARDURA) 8 MG tablet Take 1 tablet (8 mg total) by mouth at bedtime. 90  tablet 3  . escitalopram (LEXAPRO) 10 MG tablet Take 1 tablet (10 mg total) by mouth daily. 90 tablet 3  . furosemide (LASIX) 40 MG tablet Take 1 tablet (40 mg total) by mouth daily. 90 tablet 3  . glimepiride (AMARYL) 1 MG tablet Take 1 tablet (1 mg total) by mouth daily with breakfast. 90 tablet 1  . glucose blood (ACCU-CHEK GUIDE) test strip Check sugars three times daily and as needed when feeling ill E11.65, insulin use 100 each 11  . Insulin Glargine (LANTUS SOLOSTAR) 100 UNIT/ML Solostar Pen Inject 15 Units into the skin daily at 10 pm. 2 pen 3  . Insulin Pen Needle (B-D UF III MINI PEN NEEDLES) 31G X 5 MM MISC Use to inject insulin daily 100 each 3  . LANCETS ULTRA THIN 30G MISC Check sugars three times daily and as needed when feeling ill E11.65, insulin use 100 each 11  . loratadine (CLARITIN) 10 MG tablet Take 10 mg by mouth daily.    Marland Kitchen losartan (COZAAR) 50 MG tablet Take 1 tablet (50 mg total) by mouth daily. 90 tablet 3  . Multiple Vitamin (MULTIVITAMIN WITH MINERALS) TABS tablet Take 2 tablets by mouth every evening.    . Omega-3 Fatty Acids (FISH OIL PO) Take 1 tablet by mouth at bedtime.    Marland Kitchen omeprazole (PRILOSEC) 40 MG  capsule Take 1 capsule (40 mg total) by mouth daily. 90 capsule 3  . oxyCODONE (ROXICODONE) 5 MG immediate release tablet Take 1-2 tablets (5-10 mg total) by mouth every 4 (four) hours as needed for severe pain. 30 tablet 0  . tacrolimus (PROGRAF) 1 MG capsule Take 1-2 mg by mouth 2 (two) times daily. 2 mg in the morning and 1 mg in the evening    . tamsulosin (FLOMAX) 0.4 MG CAPS capsule Take 2 capsules (0.8 mg total) by mouth daily. 180 capsule 3  . VOLTAREN 1 % GEL Apply 2 g topically 3 (three) times daily. 100 g 1   No facility-administered medications prior to visit.      Per HPI unless specifically indicated in ROS section below Review of Systems     Objective:    BP 122/60 (BP Location: Right Arm, Patient Position: Sitting, Cuff Size: Large)   Pulse  89   Temp 98.9 F (37.2 C) (Oral)   Wt 279 lb (126.6 kg)   SpO2 95%   BMI 43.70 kg/m   Wt Readings from Last 3 Encounters:  07/25/17 279 lb (126.6 kg)  07/12/17 280 lb (127 kg)  07/11/17 277 lb 6.4 oz (125.8 kg)    Physical Exam  Constitutional: He appears well-developed and well-nourished. No distress.  HENT:  Head: Normocephalic and atraumatic.  Right Ear: Hearing, tympanic membrane, external ear and ear canal normal.  Left Ear: Hearing, tympanic membrane, external ear and ear canal normal.  Nose: Mucosal edema (mild without erythema) and rhinorrhea present. Right sinus exhibits frontal sinus tenderness (mild). Right sinus exhibits no maxillary sinus tenderness. Left sinus exhibits frontal sinus tenderness (mild). Left sinus exhibits no maxillary sinus tenderness.  Mouth/Throat: Uvula is midline, oropharynx is clear and moist and mucous membranes are normal. No oropharyngeal exudate, posterior oropharyngeal edema, posterior oropharyngeal erythema or tonsillar abscesses.  Eyes: Conjunctivae and EOM are normal. Pupils are equal, round, and reactive to light. No scleral icterus.  Neck: Normal range of motion. Neck supple.  Cardiovascular: Normal rate, regular rhythm, normal heart sounds and intact distal pulses.  No murmur heard. Pulmonary/Chest: Effort normal and breath sounds normal. No respiratory distress. He has no wheezes. He has no rales.  Lymphadenopathy:    He has no cervical adenopathy.  Skin: Skin is warm and dry. No rash noted.  Nursing note and vitals reviewed.  Results for orders placed or performed in visit on 07/25/17  POC Influenza A&B(BINAX/QUICKVUE)  Result Value Ref Range   Influenza A, POC Negative Negative   Influenza B, POC Negative Negative   Lab Results  Component Value Date   HGBA1C 11.5 (H) 06/28/2017       Assessment & Plan:   Problem List Items Addressed This Visit    Viral URI with cough - Primary    Flu swab negative. Anticipate viral given  short duration. Supportive care and red flags reviewed.  Update if not improving over time.  High risk for complications due to comorbidities.       Relevant Orders   POC Influenza A&B(BINAX/QUICKVUE) (Completed)       Follow up plan: Return if symptoms worsen or fail to improve.  Ria Bush, MD

## 2017-07-25 NOTE — Assessment & Plan Note (Addendum)
Flu swab negative. Anticipate viral given short duration. Supportive care and red flags reviewed.  Update if not improving over time.  High risk for complications due to comorbidities.

## 2017-07-27 ENCOUNTER — Ambulatory Visit: Payer: Medicare HMO | Admitting: Registered"

## 2017-07-31 DIAGNOSIS — G4733 Obstructive sleep apnea (adult) (pediatric): Secondary | ICD-10-CM | POA: Diagnosis not present

## 2017-08-01 ENCOUNTER — Other Ambulatory Visit: Payer: Self-pay | Admitting: Family Medicine

## 2017-08-01 DIAGNOSIS — IMO0001 Reserved for inherently not codable concepts without codable children: Secondary | ICD-10-CM

## 2017-08-01 DIAGNOSIS — N183 Chronic kidney disease, stage 3 unspecified: Secondary | ICD-10-CM

## 2017-08-01 DIAGNOSIS — E1165 Type 2 diabetes mellitus with hyperglycemia: Secondary | ICD-10-CM

## 2017-08-02 ENCOUNTER — Other Ambulatory Visit (INDEPENDENT_AMBULATORY_CARE_PROVIDER_SITE_OTHER): Payer: Medicare HMO

## 2017-08-02 DIAGNOSIS — N183 Chronic kidney disease, stage 3 unspecified: Secondary | ICD-10-CM

## 2017-08-02 DIAGNOSIS — E1165 Type 2 diabetes mellitus with hyperglycemia: Secondary | ICD-10-CM

## 2017-08-02 DIAGNOSIS — IMO0001 Reserved for inherently not codable concepts without codable children: Secondary | ICD-10-CM

## 2017-08-02 LAB — RENAL FUNCTION PANEL
ALBUMIN: 3.8 g/dL (ref 3.5–5.2)
BUN: 16 mg/dL (ref 6–23)
CALCIUM: 8.7 mg/dL (ref 8.4–10.5)
CHLORIDE: 107 meq/L (ref 96–112)
CO2: 26 meq/L (ref 19–32)
Creatinine, Ser: 1.42 mg/dL (ref 0.40–1.50)
GFR: 55.89 mL/min — ABNORMAL LOW (ref >60.00)
Glucose, Bld: 126 mg/dL — ABNORMAL HIGH (ref 70–99)
Phosphorus: 2.6 mg/dL (ref 2.3–4.6)
Potassium: 4 mEq/L (ref 3.5–5.1)
Sodium: 141 mEq/L (ref 135–145)

## 2017-08-04 LAB — FRUCTOSAMINE: FRUCTOSAMINE: 232 umol/L (ref 190–270)

## 2017-08-09 ENCOUNTER — Ambulatory Visit: Payer: Medicare HMO | Admitting: Family Medicine

## 2017-08-09 ENCOUNTER — Encounter: Payer: Self-pay | Admitting: Family Medicine

## 2017-08-09 VITALS — BP 120/72 | HR 80 | Temp 98.1°F | Wt 278.0 lb

## 2017-08-09 DIAGNOSIS — N183 Chronic kidney disease, stage 3 unspecified: Secondary | ICD-10-CM

## 2017-08-09 DIAGNOSIS — E1165 Type 2 diabetes mellitus with hyperglycemia: Secondary | ICD-10-CM | POA: Diagnosis not present

## 2017-08-09 DIAGNOSIS — N5319 Other ejaculatory dysfunction: Secondary | ICD-10-CM | POA: Diagnosis not present

## 2017-08-09 DIAGNOSIS — IMO0001 Reserved for inherently not codable concepts without codable children: Secondary | ICD-10-CM

## 2017-08-09 NOTE — Assessment & Plan Note (Signed)
Chronic issue. Anticipate med related (lexapro, flomax or cardura could contribute). Will continue to monitor.

## 2017-08-09 NOTE — Assessment & Plan Note (Signed)
Recent dx - DSME schedule for next month. Improved readings based on recall cbg's and recent fructosamine. Continue lantus, amaryl.  Reassess with labs at next visit.

## 2017-08-09 NOTE — Assessment & Plan Note (Signed)
Improved on recent check 

## 2017-08-09 NOTE — Progress Notes (Signed)
BP 120/72 (BP Location: Left Arm, Patient Position: Sitting, Cuff Size: Large)   Pulse 80   Temp 98.1 F (36.7 C) (Oral)   Wt 278 lb (126.1 kg)   SpO2 94%   BMI 43.54 kg/m    CC: DM f/u Subjective:    Patient ID: Colton Porter, male    DOB: 10/21/1965, 52 y.o.   MRN: [id number]  HPI: Colton Porter is a 52 y.o. male presenting on 08/09/2017 for Diabetes (Here for 4 wk follow-up.)   DM - does not regularly check sugars, yesterday fasting 124. Compliant with antihyperglycemic regimen which includes: amaryl 38m daily, lantus 15u daily. Denies low sugars or hypoglycemic symptoms. Denies paresthesias. Last diabetic eye exam 1/219. Pneumovax: 03/2015. Prevnar: not due. Glucometer brand: Accu-chek. DSME: referred to [facility] last month, appt scheduled next month March.  Lab Results  Component Value Date   HGBA1C 11.5 (H) 06/28/2017  Fructosamine 232 equivalent A1c 6% (08/02/2017) Diabetic Foot Exam - Simple   No data filed     Lab Results  Component Value Date   MICROALBUR <0.7 08/27/2014     Ongoing trouble with dry orgasm ongoing for years. Discussed possible etiology of cardura, flomax or lexapro. However this was present prior to starting these medications. Some ED present as well ongoing for years - started since antihypertensives.   Relevant past medical, surgical, family and social history reviewed and updated as indicated. Interim medical history since our last visit reviewed. Allergies and medications reviewed and updated. Outpatient Medications Prior to Visit  Medication Sig Dispense Refill  . acetaminophen (TYLENOL) 500 MG tablet Take 500 mg by mouth every 6 (six) hours as needed for mild pain.    .Marland Kitchenallopurinol (ZYLOPRIM) 100 MG tablet Take 2 tablets (200 mg total) by mouth daily. 180 tablet 3  . amLODipine (NORVASC) 10 MG tablet Take 1 tablet (10 mg total) by mouth daily. 90 tablet 3  . ASPIR-LOW 81 MG EC tablet Take 1 tablet (81 mg total) by mouth daily. 90 tablet 3  .  Blood Glucose Monitoring Suppl (ACCU-CHEK GUIDE) w/Device KIT 1 Units by Does not apply route as directed. 1 kit 0  . Cholecalciferol (VITAMIN D3) 2000 units TABS Take 1 tablet by mouth at bedtime.    . docusate sodium (COLACE) 100 MG capsule Take 100 mg by mouth 2 (two) times daily as needed for mild constipation.    .Marland Kitchendoxazosin (CARDURA) 8 MG tablet Take 1 tablet (8 mg total) by mouth at bedtime. 90 tablet 3  . escitalopram (LEXAPRO) 10 MG tablet Take 1 tablet (10 mg total) by mouth daily. 90 tablet 3  . furosemide (LASIX) 40 MG tablet Take 1 tablet (40 mg total) by mouth daily. 90 tablet 3  . glimepiride (AMARYL) 1 MG tablet Take 1 tablet (1 mg total) by mouth daily with breakfast. 90 tablet 1  . glucose blood (ACCU-CHEK GUIDE) test strip Check sugars three times daily and as needed when feeling ill E11.65, insulin use 100 each 11  . Insulin Glargine (LANTUS SOLOSTAR) 100 UNIT/ML Solostar Pen Inject 15 Units into the skin daily at 10 pm. 2 pen 3  . Insulin Pen Needle (B-D UF III MINI PEN NEEDLES) 31G X 5 MM MISC Use to inject insulin daily 100 each 3  . LANCETS ULTRA THIN 30G MISC Check sugars three times daily and as needed when feeling ill E11.65, insulin use 100 each 11  . loratadine (CLARITIN) 10 MG tablet Take 10 mg by mouth  daily.    . losartan (COZAAR) 50 MG tablet Take 1 tablet (50 mg total) by mouth daily. 90 tablet 3  . Multiple Vitamin (MULTIVITAMIN WITH MINERALS) TABS tablet Take 2 tablets by mouth every evening.    . Omega-3 Fatty Acids (FISH OIL PO) Take 1 tablet by mouth at bedtime.    Marland Kitchen omeprazole (PRILOSEC) 40 MG capsule Take 1 capsule (40 mg total) by mouth daily. 90 capsule 3  . oxyCODONE (ROXICODONE) 5 MG immediate release tablet Take 1-2 tablets (5-10 mg total) by mouth every 4 (four) hours as needed for severe pain. 30 tablet 0  . tacrolimus (PROGRAF) 1 MG capsule Take 1-2 mg by mouth 2 (two) times daily. 2 mg in the morning and 1 mg in the evening    . tamsulosin (FLOMAX)  0.4 MG CAPS capsule Take 2 capsules (0.8 mg total) by mouth daily. 180 capsule 3  . VOLTAREN 1 % GEL Apply 2 g topically 3 (three) times daily. 100 g 1   No facility-administered medications prior to visit.      Per HPI unless specifically indicated in ROS section below Review of Systems     Objective:    BP 120/72 (BP Location: Left Arm, Patient Position: Sitting, Cuff Size: Large)   Pulse 80   Temp 98.1 F (36.7 C) (Oral)   Wt 278 lb (126.1 kg)   SpO2 94%   BMI 43.54 kg/m   Wt Readings from Last 3 Encounters:  08/09/17 278 lb (126.1 kg)  07/25/17 279 lb (126.6 kg)  07/12/17 280 lb (127 kg)    Physical Exam  Constitutional: He appears well-developed and well-nourished. No distress.  HENT:  Head: Normocephalic and atraumatic.  Right Ear: External ear normal.  Left Ear: External ear normal.  Nose: Nose normal.  Mouth/Throat: Oropharynx is clear and moist. No oropharyngeal exudate.  Eyes: Conjunctivae and EOM are normal. Pupils are equal, round, and reactive to light. No scleral icterus.  Neck: Normal range of motion. Neck supple.  Cardiovascular: Normal rate, regular rhythm, normal heart sounds and intact distal pulses.  No murmur heard. Pulmonary/Chest: Effort normal and breath sounds normal. No respiratory distress. He has no wheezes. He has no rales.  Musculoskeletal: He exhibits no edema.  See HPI for foot exam if done --> deferred as pt has compression stockings on today - declines  Lymphadenopathy:    He has no cervical adenopathy.  Skin: Skin is warm and dry. No rash noted.  Psychiatric: He has a normal mood and affect.  Nursing note and vitals reviewed.  Results for orders placed or performed in visit on 08/02/17  Renal function panel  Result Value Ref Range   Sodium 141 135 - 145 mEq/L   Potassium 4.0 3.5 - 5.1 mEq/L   Chloride 107 96 - 112 mEq/L   CO2 26 19 - 32 mEq/L   Calcium 8.7 8.4 - 10.5 mg/dL   Albumin 3.8 3.5 - 5.2 g/dL   BUN 16 6 - 23 mg/dL    Creatinine, Ser 1.42 0.40 - 1.50 mg/dL   Glucose, Bld 126 (H) 70 - 99 mg/dL   Phosphorus 2.6 2.3 - 4.6 mg/dL   GFR 55.89 (L) >60.00 mL/min  Fructosamine  Result Value Ref Range   Fructosamine 232 190 - 270 umol/L   Lab Results  Component Value Date   WBC 7.9 05/19/2017   HGB 10.6 (L) 05/19/2017   HCT 33.5 (L) 05/19/2017   MCV 86.6 05/19/2017   PLT 122 (L)  05/19/2017    Lab Results  Component Value Date   LABURIC 7.1 06/21/2017    Lab Results  Component Value Date   PSA 2.07 10/03/2016       Assessment & Plan:   Problem List Items Addressed This Visit    Anejaculation    Chronic issue. Anticipate med related (lexapro, flomax or cardura could contribute). Will continue to monitor.       CKD (chronic kidney disease) stage 3, GFR 30-59 ml/min (HCC)    Improved on recent check.       Uncontrolled diabetes mellitus type 2 without complications ([place]) - Primary    Recent dx - DSME schedule for next month. Improved readings based on recall cbg's and recent fructosamine. Continue lantus, amaryl.  Reassess with labs at next visit.           No orders of the defined types were placed in this encounter.  No orders of the defined types were placed in this encounter.   Follow up plan: Return if symptoms worsen or fail to improve.  Ria Bush, MD

## 2017-08-09 NOTE — Patient Instructions (Addendum)
Take glimepiride always with a meal, ideally with breakfast.  Sugars seem ok. We will recheck labs next visit.  Keep follow up scheduled in April.

## 2017-08-10 ENCOUNTER — Ambulatory Visit: Payer: Medicare HMO | Admitting: Registered"

## 2017-08-10 DIAGNOSIS — B259 Cytomegaloviral disease, unspecified: Secondary | ICD-10-CM | POA: Diagnosis not present

## 2017-08-10 DIAGNOSIS — Z4823 Encounter for aftercare following liver transplant: Secondary | ICD-10-CM | POA: Diagnosis not present

## 2017-08-10 DIAGNOSIS — Z944 Liver transplant status: Secondary | ICD-10-CM | POA: Diagnosis not present

## 2017-08-18 ENCOUNTER — Ambulatory Visit: Payer: Medicare HMO | Admitting: Family Medicine

## 2017-08-18 ENCOUNTER — Encounter: Payer: Self-pay | Admitting: Family Medicine

## 2017-08-18 ENCOUNTER — Other Ambulatory Visit: Payer: Self-pay

## 2017-08-18 ENCOUNTER — Ambulatory Visit: Payer: Self-pay | Admitting: *Deleted

## 2017-08-18 VITALS — BP 122/68 | HR 101 | Temp 98.4°F | Ht 67.0 in

## 2017-08-18 DIAGNOSIS — M79672 Pain in left foot: Secondary | ICD-10-CM

## 2017-08-18 DIAGNOSIS — M109 Gout, unspecified: Secondary | ICD-10-CM | POA: Diagnosis not present

## 2017-08-18 MED ORDER — METHYLPREDNISOLONE ACETATE 80 MG/ML IJ SUSP
80.0000 mg | Freq: Once | INTRAMUSCULAR | Status: AC
  Administered 2017-08-18: 80 mg via INTRAMUSCULAR

## 2017-08-18 MED ORDER — PREDNISONE 20 MG PO TABS: ORAL_TABLET | ORAL | 0 refills | Status: DC

## 2017-08-18 NOTE — Telephone Encounter (Signed)
Colton Porter was seen by Dr. Diona Browner today at 10:45 am.  He states he never got a call back about if he should go ahead and take his allopurinol this morning so he did go ahead and take it around 9:00 am this morning.

## 2017-08-18 NOTE — Patient Instructions (Addendum)
Complete prednisone taper.  Do not stop alloputinol, but if not taking do not restart until flare over. Ice heel and continue working on low purine diet. If not improving as expected follow up with PCP.

## 2017-08-18 NOTE — Progress Notes (Signed)
   Subjective:    Patient ID: Colton Porter, male    DOB: [birth year], 52 y.o.   MRN: [id number]  HPI   52 year old male pt of Dr. Darnell Level with history of gout, DM, cirrhosis and HFpEF, neuropathy, CKD and alcohol dependence  presents for new onset pain in  left foot at heel, radiates up back of leg started yesterday.   Pain is very sensitive to touch, mild redness and swelling.  He is on allopurinol 200 mg daily. Trying to work on low purine diet.  Uric acid at last check 7.1 down form 10.4.     Last flare was 05/2018   Review of Systems  Constitutional: Negative for fatigue and fever.  HENT: Negative for ear pain.   Eyes: Negative for pain.  Respiratory: Negative for cough and shortness of breath.   Cardiovascular: Negative for chest pain, palpitations and leg swelling.  Gastrointestinal: Negative for abdominal pain.  Genitourinary: Negative for dysuria.  Musculoskeletal: Negative for arthralgias.  Neurological: Negative for syncope, light-headedness and headaches.  Psychiatric/Behavioral: Negative for dysphoric mood.       Objective:   Physical Exam  Constitutional: Vital signs are normal. He appears well-developed and well-nourished.  obesity  HENT:  Head: Normocephalic.  Right Ear: Hearing normal.  Left Ear: Hearing normal.  Nose: Nose normal.  Mouth/Throat: Oropharynx is clear and moist and mucous membranes are normal.  Neck: Trachea normal. Carotid bruit is not present. No thyroid mass and no thyromegaly present.  Cardiovascular: Normal rate, regular rhythm and normal pulses. Exam reveals no gallop, no distant heart sounds and no friction rub.  No murmur heard. No peripheral edema  Pulmonary/Chest: Effort normal and breath sounds normal. No respiratory distress.  Musculoskeletal:  Severe ttp over entire heel as well as in achilles tendon insertion, warm and slightly red. Pt cannot put heel on floor, antalgic gait.  Skin: Skin is warm, dry and intact. No rash noted.    Psychiatric: He has a normal mood and affect. His speech is normal and behavior is normal. Thought content normal.          Assessment & Plan:

## 2017-08-18 NOTE — Assessment & Plan Note (Signed)
Most consistent with acute gout flare, although unusual location and achilles tendonitis possible.  Pain has  Severity more typically seen with gout and pt is high risk for flares.

## 2017-08-18 NOTE — Telephone Encounter (Signed)
He is wanting to know if he should take the allopurinol this morning because he is having a flare of gout in his left heel.  I have routed a note to the flow coordinator asking them to let him know.   Reason for Disposition . Caller has URGENT medication question about med that PCP prescribed and triager unable to answer question  Answer Assessment - Initial Assessment Questions 1. SYMPTOMS: "Do you have any symptoms?"     I have gout and my left heel has been hurting since yesterday.   Should I take the allopurinol was told if I was having a flare of gout not to take it.   I'm a transplant pt and take a lot of pills all at once every morning.   I will need to get my pill bottle out and identify which ones are the allopurinol.    You have an appt with Dr. Diona Browner today at 10:45.  I will send a note to her nurse pool asking her if you should take the medication or not.    2. SEVERITY: If symptoms are present, ask "Are they mild, moderate or severe?"     My left heel is really hurting from the gout.  Protocols used: MEDICATION QUESTION CALL-A-AH

## 2017-08-18 NOTE — Addendum Note (Signed)
Addended by: Carter Kitten on: 08/18/2017 11:43 AM   Modules accepted: Orders

## 2017-08-22 ENCOUNTER — Telehealth: Payer: Self-pay | Admitting: Family Medicine

## 2017-08-22 NOTE — Telephone Encounter (Signed)
opened in error

## 2017-09-01 DIAGNOSIS — B259 Cytomegaloviral disease, unspecified: Secondary | ICD-10-CM | POA: Diagnosis not present

## 2017-09-01 DIAGNOSIS — Z944 Liver transplant status: Secondary | ICD-10-CM | POA: Diagnosis not present

## 2017-09-01 DIAGNOSIS — Z4823 Encounter for aftercare following liver transplant: Secondary | ICD-10-CM | POA: Diagnosis not present

## 2017-09-11 ENCOUNTER — Encounter: Payer: Medicare HMO | Attending: Family Medicine | Admitting: Registered"

## 2017-09-11 ENCOUNTER — Encounter: Payer: Self-pay | Admitting: Registered"

## 2017-09-11 DIAGNOSIS — Z713 Dietary counseling and surveillance: Secondary | ICD-10-CM | POA: Insufficient documentation

## 2017-09-11 DIAGNOSIS — E1165 Type 2 diabetes mellitus with hyperglycemia: Secondary | ICD-10-CM | POA: Diagnosis not present

## 2017-09-11 DIAGNOSIS — IMO0001 Reserved for inherently not codable concepts without codable children: Secondary | ICD-10-CM

## 2017-09-11 NOTE — Progress Notes (Signed)
Diabetes Self-Management Education  Visit Type: First/Initial  Appt. Start Time: 1100 Appt. End Time: 1220  09/11/2017  [name], identified by name and date of birth, is a 52 y.o. male with a diagnosis of Diabetes: Type 2.   ASSESSMENT Patient is here for new diabetes education. Patients states he was first diagnosed in December and the reason he went to the doctor is because his vision had drastically changed and found out that his blood sugar was 400 mg/dL and it took 3 weeks for his vision to normalize. Patient states his goal to get sugar under control with present dose of insulin, doesn't want to take more than 15 units. Patient is familiar with diabetes because his wife has it and has to take 90 units of insulin.  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 Patient states his activity is limited due to pain in feet. Patient believes pain may be partially due to gout, also stands during the day sometimes on hard floors.  Patient states he gets adequate sleep using CPAP.  Patient states he likes to grow a vegetable garden. Patient states he enjoys potatoes.  Diabetes Self-Management Education - 09/11/17 1106      Visit Information   Visit Type  First/Initial      Initial Visit   Diabetes Type  Type 2    Are you currently following a meal plan?  No    Are you taking your medications as prescribed?  Yes    Date Diagnosed  06/21/2017      Health Coping   How would you rate your overall health?  Good      Psychosocial Assessment   Patient Belief/Attitude about Diabetes  Other (comment) ok    How often do you need to have someone help you when you read instructions, pamphlets, or other written materials from your doctor or pharmacy?  1 - Never    What is the last grade level you completed in school?  12      Complications   Last HgB A1C per patient/outside source  11.5 %    How often do you check your blood sugar?  1-2 times/day    Fasting Blood glucose range (mg/dL)   70-129    Postprandial Blood glucose range (mg/dL)  70-129;130-179    Number of hypoglycemic episodes per month  0    Number of hyperglycemic episodes per week  0    Have you had a dilated eye exam in the past 12 months?  Yes    Have you had a dental exam in the past 12 months?  Yes    Are you checking your feet?  Yes    How many days per week are you checking your feet?  7      Dietary Intake   Breakfast  eggs, sausage OR bacon,egg, cheese sandwich    Snack (morning)  none    Lunch  none    Snack (afternoon)  carrots & ranch    Investment banker, corporate or baked meat, potatoes,     Snack (evening)  popcyles sugar-free sometimes OR potato chips (while watching TV)    Beverage(s)  mostly water, G2 gatorade      Exercise   Exercise Type  Light (walking / raking leaves)    How many days per week to you exercise?  5    How many minutes per day do you exercise?  30    Total minutes per week of exercise  150  Patient Education   Previous Diabetes Education  No    Nutrition management   Role of diet in the treatment of diabetes and the relationship between the three main macronutrients and blood glucose level;Carbohydrate counting    Physical activity and exercise   Role of exercise on diabetes management, blood pressure control and cardiac health.    Monitoring  Identified appropriate SMBG and/or A1C goals.;Taught/discussed recording of test results and interpretation of SMBG.    Acute complications  Taught treatment of hypoglycemia - the 15 rule.      Individualized Goals (developed by patient)   Nutrition  General guidelines for healthy choices and portions discussed    Monitoring   test my blood glucose as discussed      Outcomes   Expected Outcomes  Demonstrated interest in learning. Expect positive outcomes    Future DMSE  PRN    Program Status  Completed     Individualized Plan for Diabetes Self-Management Training:   Learning Objective:  Patient will have a greater understanding  of diabetes self-management. Patient education plan is to attend individual and/or group sessions per assessed needs and concerns.   Patient Instructions  Plan:  Aim for 4-5 Carb Choices per meal (60-75 grams)   Aim for 0-2 Carb Choices per snack if hungry (0-30 grams) Include protein with your meals and snacks Aim for 3 balanced meals per day, with not to heavy of a supper meal and not too late Consider increasing your activity level daily as tolerated Consider checking blood sugar at alternate times per day as directed by MD Continue taking medication as directed by MD Link to Living Well With Diabetes online book: www.ada-ksw.com/diabetes   Expected Outcomes:  Demonstrated interest in learning. Expect positive outcomes  Education material provided: Living Well with Diabetes, A1C conversion sheet and Snack sheet, Nutrition Care Manual low-purine/purine-restricted nutrition therapy for gout  If problems or questions, patient to contact team via:  Phone  Future DSME appointment: PRN

## 2017-09-11 NOTE — Patient Instructions (Signed)
Plan:  Aim for 4-5 Carb Choices per meal (60-75 grams)   Aim for 0-2 Carb Choices per snack if hungry (0-30 grams) Include protein with your meals and snacks Aim for 3 balanced meals per day, with not to heavy of a supper meal and not too late Consider increasing your activity level daily as tolerated Consider checking blood sugar at alternate times per day as directed by MD Continue taking medication as directed by MD Link to Living Well With Diabetes online book: www.ada-ksw.com/diabetes

## 2017-09-20 ENCOUNTER — Other Ambulatory Visit: Payer: Self-pay | Admitting: Family Medicine

## 2017-10-04 DIAGNOSIS — Z4823 Encounter for aftercare following liver transplant: Secondary | ICD-10-CM | POA: Diagnosis not present

## 2017-10-04 DIAGNOSIS — B259 Cytomegaloviral disease, unspecified: Secondary | ICD-10-CM | POA: Diagnosis not present

## 2017-10-04 DIAGNOSIS — Z944 Liver transplant status: Secondary | ICD-10-CM | POA: Diagnosis not present

## 2017-10-05 ENCOUNTER — Other Ambulatory Visit: Payer: Self-pay | Admitting: Family Medicine

## 2017-10-05 DIAGNOSIS — R351 Nocturia: Secondary | ICD-10-CM

## 2017-10-05 DIAGNOSIS — E785 Hyperlipidemia, unspecified: Secondary | ICD-10-CM

## 2017-10-05 DIAGNOSIS — K7030 Alcoholic cirrhosis of liver without ascites: Secondary | ICD-10-CM

## 2017-10-05 DIAGNOSIS — N401 Enlarged prostate with lower urinary tract symptoms: Secondary | ICD-10-CM

## 2017-10-05 DIAGNOSIS — E1165 Type 2 diabetes mellitus with hyperglycemia: Secondary | ICD-10-CM

## 2017-10-05 DIAGNOSIS — IMO0001 Reserved for inherently not codable concepts without codable children: Secondary | ICD-10-CM

## 2017-10-05 DIAGNOSIS — N183 Chronic kidney disease, stage 3 unspecified: Secondary | ICD-10-CM

## 2017-10-06 ENCOUNTER — Ambulatory Visit (INDEPENDENT_AMBULATORY_CARE_PROVIDER_SITE_OTHER): Payer: Medicare HMO

## 2017-10-06 ENCOUNTER — Ambulatory Visit: Payer: Medicare HMO

## 2017-10-06 VITALS — BP 114/72 | HR 85 | Temp 97.8°F | Ht 66.5 in | Wt 275.2 lb

## 2017-10-06 DIAGNOSIS — N401 Enlarged prostate with lower urinary tract symptoms: Secondary | ICD-10-CM | POA: Diagnosis not present

## 2017-10-06 DIAGNOSIS — R351 Nocturia: Secondary | ICD-10-CM | POA: Diagnosis not present

## 2017-10-06 DIAGNOSIS — K7030 Alcoholic cirrhosis of liver without ascites: Secondary | ICD-10-CM

## 2017-10-06 DIAGNOSIS — IMO0001 Reserved for inherently not codable concepts without codable children: Secondary | ICD-10-CM

## 2017-10-06 DIAGNOSIS — E1165 Type 2 diabetes mellitus with hyperglycemia: Secondary | ICD-10-CM | POA: Diagnosis not present

## 2017-10-06 DIAGNOSIS — E785 Hyperlipidemia, unspecified: Secondary | ICD-10-CM | POA: Diagnosis not present

## 2017-10-06 DIAGNOSIS — N183 Chronic kidney disease, stage 3 unspecified: Secondary | ICD-10-CM

## 2017-10-06 DIAGNOSIS — Z0000 Encounter for general adult medical examination without abnormal findings: Secondary | ICD-10-CM | POA: Diagnosis not present

## 2017-10-06 LAB — COMPREHENSIVE METABOLIC PANEL
ALT: 13 U/L (ref 0–53)
AST: 10 U/L (ref 0–37)
Albumin: 4.2 g/dL (ref 3.5–5.2)
Alkaline Phosphatase: 75 U/L (ref 39–117)
BUN: 21 mg/dL (ref 6–23)
CHLORIDE: 106 meq/L (ref 96–112)
CO2: 28 meq/L (ref 19–32)
Calcium: 9.2 mg/dL (ref 8.4–10.5)
Creatinine, Ser: 1.46 mg/dL (ref 0.40–1.50)
GFR: 54.09 mL/min — ABNORMAL LOW (ref >60.00)
Glucose, Bld: 116 mg/dL — ABNORMAL HIGH (ref 70–99)
POTASSIUM: 4.5 meq/L (ref 3.5–5.1)
SODIUM: 141 meq/L (ref 135–145)
Total Bilirubin: 0.7 mg/dL (ref 0.2–1.2)
Total Protein: 6.9 g/dL (ref 6.0–8.3)

## 2017-10-06 LAB — LIPID PANEL
CHOL/HDL RATIO: 4
Cholesterol: 111 mg/dL (ref 0–200)
HDL: 27.7 mg/dL — ABNORMAL LOW (ref >39.00)
LDL CALC: 44 mg/dL (ref 0–99)
NONHDL: 83.2
Triglycerides: 194 mg/dL — ABNORMAL HIGH (ref 0.0–149.0)
VLDL: 38.8 mg/dL (ref 0.0–40.0)

## 2017-10-06 LAB — CBC WITH DIFFERENTIAL/PLATELET
BASOS ABS: 0 10*3/uL (ref 0.0–0.1)
BASOS PCT: 0.6 % (ref 0.0–3.0)
Eosinophils Absolute: 0.1 10*3/uL (ref 0.0–0.7)
Eosinophils Relative: 1 % (ref 0.0–5.0)
HCT: 34.1 % — ABNORMAL LOW (ref 39.0–52.0)
Hemoglobin: 11.4 g/dL — ABNORMAL LOW (ref 13.0–17.0)
LYMPHS ABS: 1.2 10*3/uL (ref 0.7–4.0)
Lymphocytes Relative: 19.2 % (ref 12.0–46.0)
MCHC: 33.5 g/dL (ref 30.0–36.0)
MCV: 85.8 fl (ref 78.0–100.0)
MONO ABS: 0.3 10*3/uL (ref 0.1–1.0)
Monocytes Relative: 5.2 % (ref 3.0–12.0)
NEUTROS ABS: 4.7 10*3/uL (ref 1.4–7.7)
NEUTROS PCT: 74 % (ref 43.0–77.0)
PLATELETS: 131 10*3/uL — AB (ref 150.0–400.0)
RBC: 3.97 Mil/uL — ABNORMAL LOW (ref 4.22–5.81)
RDW: 15.9 % — AB (ref 11.5–15.5)
WBC: 6.3 10*3/uL (ref 4.0–10.5)

## 2017-10-06 LAB — PSA: PSA: 2.66 ng/mL (ref 0.10–4.00)

## 2017-10-06 LAB — HEMOGLOBIN A1C: Hgb A1c MFr Bld: 6.4 % (ref 4.6–6.5)

## 2017-10-06 LAB — VITAMIN D 25 HYDROXY (VIT D DEFICIENCY, FRACTURES): VITD: 36.42 ng/mL (ref 30.00–100.00)

## 2017-10-06 NOTE — Progress Notes (Signed)
I reviewed health advisor's note, was available for consultation, and agree with documentation and plan.  

## 2017-10-06 NOTE — Progress Notes (Signed)
PCP notes:   Health maintenance:  Foot exam - PCP please address at next appt A1C - completed  Abnormal screenings:   Hearing - failed  Hearing Screening   125Hz  250Hz  500Hz  1000Hz  2000Hz  3000Hz  4000Hz  6000Hz  8000Hz   Right ear:   40 40 40  0    Left ear:   40 40 40  40     Patient concerns:   Left heel pain - chronic, throbbing, 9/10, weight-bearing on foot increases pain.  Testosterone - pt has requested to have lab drawn at next appt  Nurse concerns:  None  Next PCP appt:   10/12/17 @ 0930

## 2017-10-06 NOTE — Progress Notes (Signed)
Subjective:   Colton Porter is a 52 y.o. male who presents for an Initial Medicare Annual Wellness Visit.  Review of Systems  N/A Cardiac Risk Factors include: male gender;diabetes mellitus;obesity (BMI >30kg/m2)    Objective:    Today's Vitals   10/06/17 1014  BP: 114/72  Pulse: 85  Temp: 97.8 F (36.6 C)  TempSrc: Oral  SpO2: 97%  Weight: 275 lb 4 oz (124.9 kg)  Height: 5' 6.5" (1.689 m)  PainSc: 0-No pain   Body mass index is 43.76 kg/m.  Advanced Directives 10/06/2017 09/11/2017 11/20/2015 06/09/2015 12/31/2014 12/30/2014 11/21/2014  Does Patient Have a Medical Advance Directive? No No No No No No No  Would patient like information on creating a medical advance directive? Yes (MAU/Ambulatory/Procedural Areas - Information given) No - Patient declined No - patient declined information No - patient declined information No - patient declined information - -  Pre-existing out of facility DNR order (yellow form or pink MOST form) - - - - - - -    Current Medications (verified) Outpatient Encounter Medications as of 10/06/2017  Medication Sig  . acetaminophen (TYLENOL) 500 MG tablet Take 500 mg by mouth every 6 (six) hours as needed for mild pain.  Marland Kitchen allopurinol (ZYLOPRIM) 100 MG tablet Take 2 tablets (200 mg total) by mouth daily.  Marland Kitchen amLODipine (NORVASC) 10 MG tablet Take 1 tablet (10 mg total) by mouth daily.  . ASPIR-LOW 81 MG EC tablet Take 1 tablet (81 mg total) by mouth daily.  . Blood Glucose Monitoring Suppl (ACCU-CHEK GUIDE) w/Device KIT 1 Units by Does not apply route as directed.  . Cholecalciferol (VITAMIN D3) 2000 units TABS Take 1 tablet by mouth at bedtime.  . docusate sodium (COLACE) 100 MG capsule Take 100 mg by mouth 2 (two) times daily as needed for mild constipation.  Marland Kitchen doxazosin (CARDURA) 8 MG tablet Take 1 tablet (8 mg total) by mouth at bedtime.  Marland Kitchen escitalopram (LEXAPRO) 10 MG tablet Take 1 tablet (10 mg total) by mouth daily.  . furosemide (LASIX) 40 MG  tablet Take 1 tablet (40 mg total) by mouth daily.  Marland Kitchen glimepiride (AMARYL) 1 MG tablet TAKE 1 TABLET (1 MG TOTAL) BY MOUTH DAILY WITH BREAKFAST.  Marland Kitchen glucose blood (ACCU-CHEK GUIDE) test strip Check sugars three times daily and as needed when feeling ill E11.65, insulin use  . Insulin Glargine (LANTUS SOLOSTAR) 100 UNIT/ML Solostar Pen Inject 15 Units into the skin daily at 10 pm.  . Insulin Pen Needle (B-D UF III MINI PEN NEEDLES) 31G X 5 MM MISC Use to inject insulin daily  . LANCETS ULTRA THIN 30G MISC Check sugars three times daily and as needed when feeling ill E11.65, insulin use  . loratadine (CLARITIN) 10 MG tablet Take 10 mg by mouth daily.  Marland Kitchen losartan (COZAAR) 50 MG tablet Take 1 tablet (50 mg total) by mouth daily.  . Multiple Vitamin (MULTIVITAMIN WITH MINERALS) TABS tablet Take 2 tablets by mouth every evening.  . Omega-3 Fatty Acids (FISH OIL PO) Take 1 tablet by mouth at bedtime.  Marland Kitchen omeprazole (PRILOSEC) 40 MG capsule Take 1 capsule (40 mg total) by mouth daily.  Marland Kitchen oxyCODONE (ROXICODONE) 5 MG immediate release tablet Take 1-2 tablets (5-10 mg total) by mouth every 4 (four) hours as needed for severe pain.  . predniSONE (DELTASONE) 20 MG tablet Take two tablets daily for 5 days followed by one tablet daily for 5 days  . tacrolimus (PROGRAF) 1 MG capsule Take  1-2 mg by mouth 2 (two) times daily. 2 mg in the morning and 1 mg in the evening  . tamsulosin (FLOMAX) 0.4 MG CAPS capsule Take 2 capsules (0.8 mg total) by mouth daily.  . VOLTAREN 1 % GEL Apply 2 g topically 3 (three) times daily.   No facility-administered encounter medications on file as of 10/06/2017.     Allergies (verified) Ambien [zolpidem tartrate]; Morphine and related; Hydrochlorothiazide w-triamterene; and Lisinopril   History: Past Medical History:  Diagnosis Date  . Alcohol dependence ([place]) 07/11/2012  . Alcoholic cirrhosis of liver with ascites ([place]) 07/2014   s./p transplant 12/2015  . Allergy   . Anemia     . Cellulitis of left leg   . Chronic diastolic heart failure ([place]) 11/01/2013  . CKD (chronic kidney disease) stage 3, GFR 30-59 ml/min ([facility]) 11/04/2015  . GERD (gastroesophageal reflux disease)   . Hyperlipidemia   . Hypertension   . Neuropathy   . OSA on CPAP 07/11/2012   HST 07/2013:  AHI 39/hr.    . Thrombocytopenia ([place]) 12/15/2011   Past Surgical History:  Procedure Laterality Date  . COLONOSCOPY  08/2013   hyperplastic polyps, hemorrhoids Ardis Hughs)  . 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 FINGER AMPUTATION  1997   left 4th finger - radial arm saw  . LEFT AND RIGHT HEART CATHETERIZATION WITH CORONARY ANGIOGRAM N/A 06/10/2013   Procedure: LEFT AND RIGHT HEART CATHETERIZATION WITH CORONARY ANGIOGRAM;  Surgeon: Blane Ohara, MD;  Location: [facility] CATH LAB;  Service: Cardiovascular;  Laterality: N/A;  . LIVER TRANSPLANTATION  [id number]/0962   alcoholic cirrhosis ([facility] at [facility])   Family History  Problem Relation Age of Onset  . Stroke Mother   . Emphysema Mother   . Hypertension Father   . Heart disease Father   . Emphysema Father   . Lung cancer Maternal Aunt   . Stroke Paternal Grandmother   . Heart attack Neg Hx    Social History   Socioeconomic History  . Marital status: Married    Spouse name: Not on file  . Number of children: 2  . Years of education: Not on file  . Highest education level: Not on file  Occupational History  . Occupation: Investment banker, corporate    Comment: no work lately  Social Needs  . Financial resource strain: Not on file  . Food insecurity:    Worry: Not on file    Inability: Not on file  . Transportation needs:    Medical: Not on file    Non-medical: Not on file  Tobacco Use  . Smoking status: Never Smoker  . Smokeless tobacco: Former Systems developer    Types: Chew  Substance and Sexual Activity  . Alcohol use: No    Alcohol/week: 0.0 oz    Comment: 4-6 drinks daily - NO ETOH SINCE APRIL 2016  . Drug  use: Yes    Comment: remote use of marijuana in the past, has since quit  . Sexual activity: Never  Lifestyle  . Physical activity:    Days per week: Not on file    Minutes per session: Not on file  . Stress: Not on file  Relationships  . Social connections:    Talks on phone: Not on file    Gets together: Not on file    Attends religious service: Not on file    Active member of club or organization: Not on file    Attends meetings of  clubs or organizations: Not on file    Relationship status: Not on file  Other Topics Concern  . Not on file  Social History Narrative    Lives with wife   Occupation: full disability after cirrhosis dx, some working    Activity: some yardwork   Diet: good water, good fruit intake, lots of red meats   Tobacco Counseling Counseling given: No   Clinical Intake:  Pre-visit preparation completed: Yes  Pain : No/denies pain Pain Score: 0-No pain     Nutritional Status: BMI > 30  Obese Nutritional Risks: None Diabetes: Yes CBG done?: No Did pt. bring in CBG monitor from home?: No  How often do you need to have someone help you when you read instructions, pamphlets, or other written materials from your doctor or pharmacy?: 1 - Never What is the last grade level you completed in school?: 12th grade  Interpreter Needed?: No  Comments: pt lives with spouse Information entered by :: LPinson, LPN  Activities of Daily Living In your present state of health, do you have any difficulty performing the following activities: 10/06/2017  Hearing? N  Vision? N  Difficulty concentrating or making decisions? Y  Walking or climbing stairs? Y  Comment shortness of breath  Dressing or bathing? N  Doing errands, shopping? N  Preparing Food and eating ? N  Using the Toilet? N  In the past six months, have you accidently leaked urine? N  Do you have problems with loss of bowel control? N  Managing your Medications? N  Managing your Finances? N    Housekeeping or managing your Housekeeping? N  Some recent data might be hidden     Immunizations and Health Maintenance Immunization History  Administered Date(s) Administered  . Hep A / Hep B 01/16/2015, 01/23/2015, 02/06/2015  . Hepatitis B, adult 12/04/2015  . Influenza Whole 03/24/2010  . Influenza,inj,Quad PF,6+ Mos 04/22/2015, 04/06/2017  . Pneumococcal Polysaccharide-23 04/22/2015  . Td 05/01/2007  . Tdap 12/14/2015   There are no preventive care reminders to display for this patient.  Patient Care Team: Ria Bush, MD as PCP - General (Family Medicine)  Indicate any recent Medical Services you may have received from other than [facility] providers in the past year (date may be approximate).    Assessment:   This is a routine wellness examination for Colton Porter.  Hearing/Vision screen  Hearing Screening   '125Hz'$  '250Hz'$  '500Hz'$  '1000Hz'$  '2000Hz'$  '3000Hz'$  '4000Hz'$  '6000Hz'$  '8000Hz'$   Right ear:   40 40 40  0    Left ear:   40 40 40  40    Vision Screening Comments: Last vision exam in Jan 2019 with Dr. Sandra Cockayne  Dietary issues and exercise activities discussed: Current Exercise Habits: The patient does not participate in regular exercise at present, Exercise limited by: None identified  Goals    . Increase physical activity     When tolerated, I will attempt to walk for 30 minutes up to 7 days per week.       Depression Screen PHQ 2/9 Scores 10/06/2017 09/11/2017  PHQ - 2 Score 0 0  PHQ- 9 Score 0 -    Fall Risk Fall Risk  10/06/2017 09/11/2017  Falls in the past year? No No   Cognitive Function: MMSE - Mini Mental State Exam 10/06/2017  Orientation to time 5  Orientation to Place 5  Registration 3  Attention/ Calculation 0  Recall 3  Language- name 2 objects 0  Language- repeat 1  Language- follow  3 step command 3  Language- read & follow direction 0  Write a sentence 0  Copy design 0  Total score 20     PLEASE NOTE: A Mini-Cog screen was completed. Maximum score is  20. A value of 0 denotes this part of [place] MMSE was not completed or the patient failed this part of the Mini-Cog screening.   Mini-Cog Screening Orientation to Time - Max 5 pts Orientation to Place - Max 5 pts Registration - Max 3 pts Recall - Max 3 pts Language Repeat - Max 1 pts Language Follow 3 Step Command - Max 3 pts     Screening Tests Health Maintenance  Topic Date Due  . FOOT EXAM  10/12/2017 (Originally 09/13/1975)  . INFLUENZA VACCINE  01/25/2018  . HEMOGLOBIN A1C  04/07/2018  . OPHTHALMOLOGY EXAM  07/13/2018  . PNEUMOCOCCAL POLYSACCHARIDE VACCINE (2) 04/21/2020  . COLONOSCOPY  09/04/2023  . DTaP/Tdap/Td (2 - Td) 12/13/2025  . TETANUS/TDAP  12/13/2025  . HIV Screening  Completed      Plan:     I have personally reviewed, addressed, and noted the following in the patient's chart:  A. Medical and social history B. Use of alcohol, tobacco or illicit drugs  C. Current medications and supplements D. Functional ability and status E.  Nutritional status F.  Physical activity G. Advance directives H. List of other physicians I.  Hospitalizations, surgeries, and ER visits in previous 12 months J.  [place] to include hearing, vision, cognitive, depression L. Referrals and appointments - none  In addition, I have reviewed and discussed with patient certain preventive protocols, quality metrics, and best practice recommendations. A written personalized care plan for preventive services as well as general preventive health recommendations were provided to patient.  See attached scanned questionnaire for additional information.   Signed,   Lindell Noe, MHA, BS, LPN Health Coach

## 2017-10-06 NOTE — Patient Instructions (Signed)
Colton Porter , Thank you for taking time to come for your Medicare Wellness Visit. I appreciate your ongoing commitment to your health goals. Please review the following plan we discussed and let me know if I can assist you in the future.   These are the goals we discussed: Goals    . Increase physical activity     When tolerated, I will attempt to walk for 30 minutes up to 7 days per week.        This is a list of the screening recommended for you and due dates:  Health Maintenance  Topic Date Due  . Complete foot exam   10/12/2017*  . Flu Shot  01/25/2018  . Hemoglobin A1C  04/07/2018  . Eye exam for diabetics  07/13/2018  . Pneumococcal vaccine (2) 04/21/2020  . Colon Cancer Screening  09/04/2023  . DTaP/Tdap/Td vaccine (2 - Td) 12/13/2025  . Tetanus Vaccine  12/13/2025  . HIV Screening  Completed  *Topic was postponed. The date shown is not the original due date.   Preventive Care for Adults  A healthy lifestyle and preventive care can promote health and wellness. Preventive health guidelines for adults include the following key practices.  . A routine yearly physical is a good way to check with your health care provider about your health and preventive screening. It is a chance to share any concerns and updates on your health and to receive a thorough exam.  . Visit your dentist for a routine exam and preventive care every 6 months. Brush your teeth twice a day and floss once a day. Good oral hygiene prevents tooth decay and gum disease.  . The frequency of eye exams is based on your age, health, family medical history, use  of contact lenses, and other factors. Follow your health care provider's recommendations for frequency of eye exams.  . Eat a healthy diet. Foods like vegetables, fruits, whole grains, low-fat dairy products, and lean protein foods contain the nutrients you need without too many calories. Decrease your intake of foods high in solid fats, added sugars,  and salt. Eat the right amount of calories for you. Get information about a proper diet from your health care provider, if necessary.  . Regular physical exercise is one of the most important things you can do for your health. Most adults should get at least 150 minutes of moderate-intensity exercise (any activity that increases your heart rate and causes you to sweat) each week. In addition, most adults need muscle-strengthening exercises on 2 or more days a week.  Silver Sneakers may be a benefit available to you. To determine eligibility, you may visit the website: www.silversneakers.com or contact program at 279-652-1318 Mon-Fri between 8AM-8PM.   . Maintain a healthy weight. The body mass index (BMI) is a screening tool to identify possible weight problems. It provides an estimate of body fat based on height and weight. Your health care provider can find your BMI and can help you achieve or maintain a healthy weight.   For adults 20 years and older: ? A BMI below 18.5 is considered underweight. ? A BMI of 18.5 to 24.9 is normal. ? A BMI of 25 to 29.9 is considered overweight. ? A BMI of 30 and above is considered obese.   . Maintain normal blood lipids and cholesterol levels by exercising and minimizing your intake of saturated fat. Eat a balanced diet with plenty of fruit and vegetables. Blood tests for lipids and cholesterol should begin  at age 68 and be repeated every 5 years. If your lipid or cholesterol levels are high, you are over 50, or you are at high risk for heart disease, you may need your cholesterol levels checked more frequently. Ongoing high lipid and cholesterol levels should be treated with medicines if diet and exercise are not working.  . If you smoke, find out from your health care provider how to quit. If you do not use tobacco, please do not start.  . If you choose to drink alcohol, please do not consume more than 2 drinks per day. One drink is considered to be 12  ounces (355 mL) of beer, 5 ounces (148 mL) of wine, or 1.5 ounces (44 mL) of liquor.  . If you are 38-66 years old, ask your health care provider if you should take aspirin to prevent strokes.  . Use sunscreen. Apply sunscreen liberally and repeatedly throughout the day. You should seek shade when your shadow is shorter than you. Protect yourself by wearing long sleeves, pants, a wide-brimmed hat, and sunglasses year round, whenever you are outdoors.  . Once a month, do a whole body skin exam, using a mirror to look at the skin on your back. Tell your health care provider of new moles, moles that have irregular borders, moles that are larger than a pencil eraser, or moles that have changed in shape or color.

## 2017-10-12 ENCOUNTER — Encounter: Payer: Self-pay | Admitting: Family Medicine

## 2017-10-12 ENCOUNTER — Ambulatory Visit (INDEPENDENT_AMBULATORY_CARE_PROVIDER_SITE_OTHER): Payer: Medicare HMO | Admitting: Family Medicine

## 2017-10-12 VITALS — BP 122/70 | HR 76 | Temp 97.7°F | Ht 67.0 in | Wt 275.0 lb

## 2017-10-12 DIAGNOSIS — M1A0790 Idiopathic chronic gout, unspecified ankle and foot, without tophus (tophi): Secondary | ICD-10-CM

## 2017-10-12 DIAGNOSIS — F321 Major depressive disorder, single episode, moderate: Secondary | ICD-10-CM | POA: Diagnosis not present

## 2017-10-12 DIAGNOSIS — K219 Gastro-esophageal reflux disease without esophagitis: Secondary | ICD-10-CM | POA: Diagnosis not present

## 2017-10-12 DIAGNOSIS — E1169 Type 2 diabetes mellitus with other specified complication: Secondary | ICD-10-CM

## 2017-10-12 DIAGNOSIS — Z6841 Body Mass Index (BMI) 40.0 and over, adult: Secondary | ICD-10-CM

## 2017-10-12 DIAGNOSIS — Z0000 Encounter for general adult medical examination without abnormal findings: Secondary | ICD-10-CM

## 2017-10-12 DIAGNOSIS — E1122 Type 2 diabetes mellitus with diabetic chronic kidney disease: Secondary | ICD-10-CM | POA: Diagnosis not present

## 2017-10-12 DIAGNOSIS — R351 Nocturia: Secondary | ICD-10-CM

## 2017-10-12 DIAGNOSIS — I10 Essential (primary) hypertension: Secondary | ICD-10-CM | POA: Diagnosis not present

## 2017-10-12 DIAGNOSIS — G4733 Obstructive sleep apnea (adult) (pediatric): Secondary | ICD-10-CM

## 2017-10-12 DIAGNOSIS — Z944 Liver transplant status: Secondary | ICD-10-CM

## 2017-10-12 DIAGNOSIS — I5032 Chronic diastolic (congestive) heart failure: Secondary | ICD-10-CM

## 2017-10-12 DIAGNOSIS — Z794 Long term (current) use of insulin: Secondary | ICD-10-CM | POA: Diagnosis not present

## 2017-10-12 DIAGNOSIS — N183 Chronic kidney disease, stage 3 unspecified: Secondary | ICD-10-CM

## 2017-10-12 DIAGNOSIS — R5383 Other fatigue: Secondary | ICD-10-CM

## 2017-10-12 DIAGNOSIS — Z9989 Dependence on other enabling machines and devices: Secondary | ICD-10-CM

## 2017-10-12 DIAGNOSIS — F1021 Alcohol dependence, in remission: Secondary | ICD-10-CM

## 2017-10-12 DIAGNOSIS — D696 Thrombocytopenia, unspecified: Secondary | ICD-10-CM | POA: Diagnosis not present

## 2017-10-12 DIAGNOSIS — N401 Enlarged prostate with lower urinary tract symptoms: Secondary | ICD-10-CM

## 2017-10-12 DIAGNOSIS — E785 Hyperlipidemia, unspecified: Secondary | ICD-10-CM

## 2017-10-12 DIAGNOSIS — M79672 Pain in left foot: Secondary | ICD-10-CM

## 2017-10-12 NOTE — Assessment & Plan Note (Signed)
Preventative protocols reviewed and updated unless pt declined. Discussed healthy diet and lifestyle.  

## 2017-10-12 NOTE — Progress Notes (Signed)
BP 122/70 (BP Location: Left Arm, Patient Position: Sitting, Cuff Size: Large)   Pulse 76   Temp 97.7 F (36.5 C) (Oral)   Ht _0  (1.702 m)   Wt 275 lb (124.7 kg)   SpO2 95%   BMI 43.07 kg/m    CC: CPE Subjective:    Patient ID: Colton Porter, male    DOB: 11-02-1965, 52 y.o.   MRN: [id number]  HPI: FERRIS FIELDEN is a 52 y.o. male presenting on 10/12/2017 for Medicare Wellness (Pt 2. Pt needs labs done for testosterone checked.)   Saw Lesia last week for medicare wellness visit. Note reviewed.   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  Acute gout flare 07/2017 saw Dr Diona Browner. Treated with prednisone taper. Persistent L heel discomfort.  Recent DM dx with significant changes and improvement in diabetic control. Completed DSME. Compliant with amaryl 76m daily and lantus 10u daily.  Lab Results  Component Value Date   HGBA1C 6.4 10/06/2017  [facility] [place] [place] [facility] [place]  Ongoing chronic dyspnea. Ongoing fatigue, depression stable on lexapro, endorses decreased libido.   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 No changing moles on skin.  Non smoker. Quit chewing tobacco 10/10/2015.  Alcohol - abstinent since 10/10/2014.   Lives with wife Occupation: full disability after cirrhosis dx, some working  Activity: some yardwork Diet: good water, good fruit intake  Relevant past medical, surgical, family and social history reviewed and updated as indicated. Interim medical history since our last visit reviewed. Allergies and medications reviewed and updated. Outpatient Medications Prior to Visit  Medication Sig Dispense  Refill  . acetaminophen (TYLENOL) 500 MG tablet Take 500 mg by mouth every 6 (six) hours as needed for mild pain.    .Marland Kitchenallopurinol (ZYLOPRIM) 100 MG tablet Take 2 tablets (200 mg total) by mouth daily. 180 tablet 3  . amLODipine (NORVASC) 10 MG tablet Take 1 tablet (10 mg total) by mouth daily. 90 tablet 3  . ASPIR-LOW 81 MG EC tablet Take 1 tablet (81 mg total) by mouth daily. 90 tablet 3  . Blood Glucose Monitoring Suppl (ACCU-CHEK GUIDE) w/Device KIT 1 Units by Does not apply route as directed. 1 kit 0  . Cholecalciferol (VITAMIN D3) 2000 units TABS Take 1 tablet by mouth at bedtime.    . docusate sodium (COLACE) 100 MG capsule Take 100 mg by mouth 2 (two) times daily as needed for mild constipation.    .Marland Kitchendoxazosin (CARDURA) 8 MG tablet Take 1 tablet (8 mg total) by mouth at bedtime. 90 tablet 3  . escitalopram (LEXAPRO) 10 MG tablet Take 1 tablet (10 mg total) by mouth daily. 90 tablet 3  . furosemide (LASIX) 40 MG tablet Take 1 tablet (40 mg total) by mouth daily. 90 tablet 3  . glimepiride (AMARYL) 1 MG tablet TAKE 1 TABLET (1 MG TOTAL) BY MOUTH DAILY WITH BREAKFAST. 90 tablet 1  . glucose blood (ACCU-CHEK GUIDE) test strip Check sugars three times daily and as needed when feeling ill E11.65, insulin use 100 each 11  . Insulin Glargine (LANTUS SOLOSTAR) 100 UNIT/ML Solostar Pen Inject 15 Units into the skin daily at 10 pm. 2 pen 3  . Insulin Pen Needle (B-D UF III MINI  PEN NEEDLES) 31G X 5 MM MISC Use to inject insulin daily 100 each 3  . LANCETS ULTRA THIN 30G MISC Check sugars three times daily and as needed when feeling ill E11.65, insulin use 100 each 11  . loratadine (CLARITIN) 10 MG tablet Take 10 mg by mouth daily.    Marland Kitchen losartan (COZAAR) 50 MG tablet Take 1 tablet (50 mg total) by mouth daily. 90 tablet 3  . Multiple Vitamin (MULTIVITAMIN WITH MINERALS) TABS tablet Take 2 tablets by mouth every evening.    . Omega-3 Fatty Acids (FISH OIL PO) Take 1 tablet by mouth at bedtime.    Marland Kitchen  omeprazole (PRILOSEC) 40 MG capsule Take 1 capsule (40 mg total) by mouth daily. 90 capsule 3  . oxyCODONE (ROXICODONE) 5 MG immediate release tablet Take 1-2 tablets (5-10 mg total) by mouth every 4 (four) hours as needed for severe pain. 30 tablet 0  . predniSONE (DELTASONE) 20 MG tablet Take two tablets daily for 5 days followed by one tablet daily for 5 days 15 tablet 0  . tacrolimus (PROGRAF) 1 MG capsule Take 1-2 mg by mouth 2 (two) times daily. 2 mg in the morning and 1 mg in the evening    . tamsulosin (FLOMAX) 0.4 MG CAPS capsule Take 2 capsules (0.8 mg total) by mouth daily. 180 capsule 3  . VOLTAREN 1 % GEL Apply 2 g topically 3 (three) times daily. 100 g 1   No facility-administered medications prior to visit.      Per HPI unless specifically indicated in ROS section below Review of Systems  Constitutional: Negative for activity change, appetite change, chills, fatigue, fever and unexpected weight change.  HENT: Negative for hearing loss.   Eyes: Negative for visual disturbance.  Respiratory: Positive for shortness of breath. Negative for cough, chest tightness and wheezing.   Cardiovascular: Negative for chest pain, palpitations and leg swelling.  Gastrointestinal: Positive for constipation (mild). Negative for abdominal distention, abdominal pain, blood in stool, diarrhea, nausea and vomiting.  Genitourinary: Negative for difficulty urinating and hematuria.  Musculoskeletal: Negative for arthralgias, myalgias and neck pain.  Skin: Negative for rash.  Neurological: Positive for dizziness. Negative for seizures, syncope and headaches.  Hematological: Negative for adenopathy. Bruises/bleeds easily.  Psychiatric/Behavioral: Negative for dysphoric mood. The patient is not nervous/anxious.       Objective:    BP 122/70 (BP Location: Left Arm, Patient Position: Sitting, Cuff Size: Large)   Pulse 76   Temp 97.7 F (36.5 C) (Oral)   Ht _0  (1.702 m)   Wt 275 lb (124.7 kg)    SpO2 95%   BMI 43.07 kg/m   Wt Readings from Last 3 Encounters:  10/12/17 275 lb (124.7 kg)  10/06/17 275 lb 4 oz (124.9 kg)  08/09/17 278 lb (126.1 kg)    Physical Exam  Constitutional: He is oriented to person, place, and time. He appears well-developed and well-nourished. No distress.  HENT:  Head: Normocephalic and atraumatic.  Right Ear: Hearing, tympanic membrane, external ear and ear canal normal.  Left Ear: Hearing, tympanic membrane, external ear and ear canal normal.  Nose: Nose normal.  Mouth/Throat: Uvula is midline, oropharynx is clear and moist and mucous membranes are normal. No oropharyngeal exudate, posterior oropharyngeal edema or posterior oropharyngeal erythema.  Eyes: Pupils are equal, round, and reactive to light. Conjunctivae and EOM are normal. No scleral icterus.  Neck: Normal range of motion. Neck supple.  Cardiovascular: Normal rate, regular rhythm, normal heart sounds and  intact distal pulses.  No murmur heard. Pulses:      Radial pulses are 2+ on the right side, and 2+ on the left side.  Pulmonary/Chest: Effort normal and breath sounds normal. No respiratory distress. He has no wheezes. He has no rales.  Abdominal: Soft. Bowel sounds are normal. He exhibits no distension and no mass. There is no tenderness. There is no rebound and no guarding.  Genitourinary: Rectum normal and prostate normal. Rectal exam shows no external hemorrhoid, no internal hemorrhoid, no fissure, no mass, no tenderness and anal tone normal. Prostate is not enlarged (15gm) and not tender.  Musculoskeletal: Normal range of motion. He exhibits no edema.  L heel mildly tender to palpation just below insertion of achilles without significant retrocalcaneal edema No erythema or warmth of L heel R heel WNL  Lymphadenopathy:    He has no cervical adenopathy.  Neurological: He is alert and oriented to person, place, and time.  CN grossly intact, station and gait intact  Skin: Skin is warm  and dry. No rash noted.  Psychiatric: He has a normal mood and affect. His behavior is normal. Judgment and thought content normal.  Nursing note and vitals reviewed.    Diabetic Foot Exam - Simple   Simple Foot Form Diabetic Foot exam was performed with the following findings:  Yes 10/12/2017 10:07 AM  Visual Inspection No deformities, no ulcerations, no other skin breakdown bilaterally:  Yes Sensation Testing Intact to touch and monofilament testing bilaterally:  Yes Pulse Check Posterior Tibialis and Dorsalis pulse intact bilaterally:  Yes Comments     Results for orders placed or performed in visit on 10/06/17  VITAMIN D 25 Hydroxy (Vit-D Deficiency, Fractures)  Result Value Ref Range   VITD 36.42 30.00 - 100.00 ng/mL  CBC with Differential/Platelet  Result Value Ref Range   WBC 6.3 4.0 - 10.5 K/uL   RBC 3.97 (L) 4.22 - 5.81 Mil/uL   Hemoglobin 11.4 (L) 13.0 - 17.0 g/dL   HCT 34.1 (L) 39.0 - 52.0 %   MCV 85.8 78.0 - 100.0 fl   MCHC 33.5 30.0 - 36.0 g/dL   RDW 15.9 (H) 11.5 - 15.5 %   Platelets 131.0 (L) 150.0 - 400.0 K/uL   Neutrophils Relative % 74.0 43.0 - 77.0 %   Lymphocytes Relative 19.2 12.0 - 46.0 %   Monocytes Relative 5.2 3.0 - 12.0 %   Eosinophils Relative 1.0 0.0 - 5.0 %   Basophils Relative 0.6 0.0 - 3.0 %   Neutro Abs 4.7 1.4 - 7.7 K/uL   Lymphs Abs 1.2 0.7 - 4.0 K/uL   Monocytes Absolute 0.3 0.1 - 1.0 K/uL   Eosinophils Absolute 0.1 0.0 - 0.7 K/uL   Basophils Absolute 0.0 0.0 - 0.1 K/uL  PSA  Result Value Ref Range   PSA 2.66 0.10 - 4.00 ng/mL  Hemoglobin A1c  Result Value Ref Range   Hgb A1c MFr Bld 6.4 4.6 - 6.5 %  Lipid panel  Result Value Ref Range   Cholesterol 111 0 - 200 mg/dL   Triglycerides 194.0 (H) 0.0 - 149.0 mg/dL   HDL 27.70 (L) >39.00 mg/dL   VLDL 38.8 0.0 - 40.0 mg/dL   LDL Cholesterol 44 0 - 99 mg/dL   Total CHOL/HDL Ratio 4    NonHDL 83.20   Comprehensive metabolic panel  Result Value Ref Range   Sodium 141 135 - 145 mEq/L     Potassium 4.5 3.5 - 5.1 mEq/L   Chloride 106  96 - 112 mEq/L   CO2 28 19 - 32 mEq/L   Glucose, Bld 116 (H) 70 - 99 mg/dL   BUN 21 6 - 23 mg/dL   Creatinine, Ser 1.46 0.40 - 1.50 mg/dL   Total Bilirubin 0.7 0.2 - 1.2 mg/dL   Alkaline Phosphatase 75 39 - 117 U/L   AST 10 0 - 37 U/L   ALT 13 0 - 53 U/L   Total Protein 6.9 6.0 - 8.3 g/dL   Albumin 4.2 3.5 - 5.2 g/dL   Calcium 9.2 8.4 - 10.5 mg/dL   GFR 54.09 (L) >60.00 mL/min   Lab Results  Component Value Date   LABURIC 7.1 06/21/2017       Assessment & Plan:   Problem List Items Addressed This Visit    (HFpEF) heart failure with preserved ejection fraction (HCC)    Seems euvolemic. Continue lasix 71m daily.       BPH (benign prostatic hyperplasia)    Stable PSA/DRE. Continue cardura, flomax.       Chronic gout    Current heel pain not consistent with ongoing gout flare - continue allopurinol 2029mdaily.       CKD (chronic kidney disease) stage 3, GFR 30-59 ml/min (HCC)    Chronic, stable. Followed by nephrology.       Depression, major, single episode, moderate (HCC)    Chronic, stable. Continue lexapro 1015maily.      Dyslipidemia    Chronic predominant high trig, low HDL, off statin. LDL 44. Continue 1 fish oil tablet daily.  The ASCVD Risk score (GoMikey Bussing Jr., et al., 2013) failed to calculate for the following reasons:   The valid total cholesterol range is 130 to 320 mg/dL       Essential hypertension    Chronic, stable. Continue current regimen.       GERD    Continues omeprazole 48m45mily.       History of alcohol dependence (HCC)[place] Abstinent since 09/2014.      Morbid obesity with BMI of 40.0-44.9, adult (HCC)[place] Encouraged ongoing regular exercise routine for goal sustainable weight loss.       OSA on CPAP    Continues CPAP, followed by pulmonology. Sees yearly (last 06/2017).       Other fatigue    With dyspnea, decreased libido, depression managed with lexapro. Discussed testosterone  replacement and risks entailed. In h/o liver transplant, prior to undergoing work-up would first want to see if he would be a testosterone replacement candidate - he will check with liver transplant team and if approved, will check T. If low, discussed likely referral to endo for further eval/management. Pt agrees with plan.       Pain of left heel    Ongoing discomfort at insertion of achilles on left. I suggested he start with gel heel cushion inserts and update [place] effect. No ongoing signs of gout flare. Not consistent with retrocalcaneal bursitis or with achilles tendinopathy.       Routine general medical examination at a health care facility - Primary    Preventative protocols reviewed and updated unless pt declined. Discussed healthy diet and lifestyle.       Status post liver transplant (HCC)[place] (HCC)    Chronic, stable. Continue to monitor.       Type 2 diabetes mellitus with other specified complication (HCC)[place] Congratulated on marked improvement. Continue current regimen. Completed DSME.  Foot exam today.           No orders of the defined types were placed in this encounter.  No orders of the defined types were placed in this encounter.   Follow up plan: Return in about 3 months (around 01/11/2018) for follow up visit.  Ria Bush, MD

## 2017-10-12 NOTE — Patient Instructions (Addendum)
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 Testosterone has not been checked yet. Try heel cushion gel inserts to place into shoes for further support. Let us know if ongoing pain.  Congratulations on sugar control!  Return as needed or in 3-4 months for follow up visit.   Health Maintenance, Male A healthy lifestyle and preventive care is important for your health and wellness. Ask your health care provider about what schedule of regular examinations is right for you. What should I know about weight and diet? Eat a Healthy Diet  Eat plenty of vegetables, fruits, whole grains, low-fat dairy products, and lean protein.  Do not eat a lot of foods high in solid fats, added sugars, or salt.  Maintain a Healthy Weight Regular exercise can help you achieve or maintain a healthy weight. You should:  Do at least 150 minutes of exercise each week. The exercise should increase your heart rate and make you sweat (moderate-intensity exercise).  Do strength-training exercises at least twice a week.  Watch Your Levels of Cholesterol and Blood Lipids  Have your blood tested for lipids and cholesterol every 5 years starting at 51 years of age. If you are at high risk for heart disease, you should start having your blood tested when you are 51 years old. You may need to have your cholesterol levels checked more often if: ? Your lipid or cholesterol levels are high. ? You are older than 51 years of age. ? You are at high risk for heart disease.  What should I know about cancer screening? Many types of cancers can be detected early and may often be prevented. Lung Cancer  You should be screened every year for lung cancer if: ? You are a current smoker who has smoked for at least 30 years. ? You are a former smoker who has quit within the past 15 years.  Talk to your health care provider about your screening options, when you  should start screening, and how often you should be screened.  Colorectal Cancer  Routine colorectal cancer screening usually begins at 51 years of age and should be repeated every 5-10 years until you are 51 years old. You may need to be screened more often if early forms of precancerous polyps or small growths are found. Your health care provider may recommend screening at an earlier age if you have risk factors for colon cancer.  Your health care provider may recommend using home test kits to check for hidden blood in the stool.  A small camera at the end of a tube can be used to examine your colon (sigmoidoscopy or colonoscopy). This checks for the earliest forms of colorectal cancer.  Prostate and Testicular Cancer  Depending on your age and overall health, your health care provider may do certain tests to screen for prostate and testicular cancer.  Talk to your health care provider about any symptoms or concerns you have about testicular or prostate cancer.  Skin Cancer  Check your skin from head to toe regularly.  Tell your health care provider about any new moles or changes in moles, especially if: ? There is a change in a mole's size, shape, or color. ? You have a mole that is larger than a pencil eraser.  Always use sunscreen. Apply sunscreen liberally and repeat throughout the day.  Protect yourself by wearing long sleeves, pants, a wide-brimmed hat, and sunglasses when outside.  What should  I know about heart disease, diabetes, and high blood pressure?  If you are 48-71 years of age, have your blood pressure checked every 3-5 years. If you are 35 years of age or older, have your blood pressure checked every year. You should have your blood pressure measured twice-once when you are at a hospital or clinic, and once when you are not at a hospital or clinic. Record the average of the two measurements. To check your blood pressure when you are not at a hospital or clinic, you  can use: ? An automated blood pressure machine at a pharmacy. ? A home blood pressure monitor.  Talk to your health care provider about your target blood pressure.  If you are between 60-12 years old, ask your health care provider if you should take aspirin to prevent heart disease.  Have regular diabetes screenings by checking your fasting blood sugar level. ? If you are at a normal weight and have a low risk for diabetes, have this test once every three years after the age of 21. ? If you are overweight and have a high risk for diabetes, consider being tested at a younger age or more often.  A one-time screening for abdominal aortic aneurysm (AAA) by ultrasound is recommended for men aged 22-75 years who are current or former smokers. What should I know about preventing infection? Hepatitis B If you have a higher risk for hepatitis B, you should be screened for this virus. Talk with your health care provider to find out if you are at risk for hepatitis B infection. Hepatitis C Blood testing is recommended for:  Everyone born from [birth year] through 1964.  Anyone with known risk factors for hepatitis C.  Sexually Transmitted Diseases (STDs)  You should be screened each year for STDs including gonorrhea and chlamydia if: ? You are sexually active and are younger than 51 years of age. ? You are older than 51 years of age and your health care provider tells you that you are at risk for this type of infection. ? Your sexual activity has changed since you were last screened and you are at an increased risk for chlamydia or gonorrhea. Ask your health care provider if you are at risk.  Talk with your health care provider about whether you are at high risk of being infected with HIV. Your health care provider may recommend a prescription medicine to help prevent HIV infection.  What else can I do?  Schedule regular health, dental, and eye exams.  Stay current with your vaccines  (immunizations).  Do not use any tobacco products, such as cigarettes, chewing tobacco, and e-cigarettes. If you need help quitting, ask your health care provider.  Limit alcohol intake to no more than 2 drinks per day. One drink equals 12 ounces of beer, 5 ounces of wine, or 1 ounces of hard liquor.  Do not use street drugs.  Do not share needles.  Ask your health care provider for help if you need support or information about quitting drugs.  Tell your health care provider if you often feel depressed.  Tell your health care provider if you have ever been abused or do not feel safe at home. This information is not intended to replace advice given to you by your health care provider. Make sure you discuss any questions you have with your health care provider. Document Released: 12/10/2007 Document Revised: 02/10/2016 Document Reviewed: 03/17/2015 Elsevier Interactive Patient Education  [employer].

## 2017-10-14 DIAGNOSIS — G8929 Other chronic pain: Secondary | ICD-10-CM | POA: Insufficient documentation

## 2017-10-14 DIAGNOSIS — M79672 Pain in left foot: Secondary | ICD-10-CM

## 2017-10-14 NOTE — Assessment & Plan Note (Signed)
Chronic, stable. Continue to monitor.  

## 2017-10-14 NOTE — Assessment & Plan Note (Signed)
Chronic, stable. Continue lexapro 10mg  daily.

## 2017-10-14 NOTE — Assessment & Plan Note (Signed)
Abstinent since 09/2014.

## 2017-10-14 NOTE — Assessment & Plan Note (Addendum)
Ongoing discomfort at insertion of achilles on left. I suggested he start with gel heel cushion inserts and update [name] with effect. No ongoing signs of gout flare. Not consistent with retrocalcaneal bursitis or with achilles tendinopathy.

## 2017-10-14 NOTE — Assessment & Plan Note (Signed)
Continues omeprazole 40mg daily.  

## 2017-10-14 NOTE — Assessment & Plan Note (Signed)
Congratulated on marked improvement. Continue current regimen. Completed DSME. Foot exam today.

## 2017-10-14 NOTE — Assessment & Plan Note (Addendum)
Stable PSA/DRE. Continue cardura, flomax.

## 2017-10-14 NOTE — Assessment & Plan Note (Signed)
Continues CPAP, followed by pulmonology. Sees yearly (last 06/2017).

## 2017-10-14 NOTE — Assessment & Plan Note (Addendum)
Chronic predominant high trig, low HDL, off statin. LDL 44. Continue 1 fish oil tablet daily.  The ASCVD Risk score Mikey Bussing DC Jr., et al., 2013) failed to calculate for the following reasons:   The valid total cholesterol range is 130 to 320 mg/dL

## 2017-10-14 NOTE — Assessment & Plan Note (Signed)
Current heel pain not consistent with ongoing gout flare - continue allopurinol 200mg  daily.

## 2017-10-14 NOTE — Assessment & Plan Note (Signed)
Seems euvolemic. Continue lasix 40mg  daily.

## 2017-10-14 NOTE — Assessment & Plan Note (Signed)
Chronic, stable. Continue current regimen. 

## 2017-10-14 NOTE — Assessment & Plan Note (Signed)
Encouraged ongoing regular exercise routine for goal sustainable weight loss.

## 2017-10-14 NOTE — Assessment & Plan Note (Signed)
With dyspnea, decreased libido, depression managed with lexapro. Discussed testosterone replacement and risks entailed. In h/o liver transplant, prior to undergoing work-up would first want to see if he would be a testosterone replacement candidate - he will check with liver transplant team and if approved, will check T. If low, discussed likely referral to endo for further eval/management. Pt agrees with plan.

## 2017-10-14 NOTE — Assessment & Plan Note (Signed)
Chronic, stable  Followed by nephrology

## 2017-10-23 ENCOUNTER — Encounter: Payer: Self-pay | Admitting: Family Medicine

## 2017-10-23 ENCOUNTER — Ambulatory Visit: Payer: Medicare HMO | Admitting: Family Medicine

## 2017-10-23 VITALS — BP 134/86 | HR 74 | Temp 97.8°F | Ht 67.0 in | Wt 274.5 lb

## 2017-10-23 DIAGNOSIS — J0110 Acute frontal sinusitis, unspecified: Secondary | ICD-10-CM | POA: Diagnosis not present

## 2017-10-23 DIAGNOSIS — J019 Acute sinusitis, unspecified: Secondary | ICD-10-CM | POA: Insufficient documentation

## 2017-10-23 MED ORDER — AMOXICILLIN-POT CLAVULANATE 875-125 MG PO TABS: 1.0000 | ORAL_TABLET | Freq: Two times a day (BID) | ORAL | 0 refills | Status: DC

## 2017-10-23 NOTE — Assessment & Plan Note (Signed)
10 d of sinus pain/ pressure after uri  Cover with augmentin  Fluids/rest/mucinex flonase may help  Disc symptomatic care - see instructions on AVS  Update if not starting to improve in a week or if worsening

## 2017-10-23 NOTE — Patient Instructions (Signed)
Take augmentin for sinus infection  Drink lots of water  Try plain mucinex over the counter to loosen congestion in your head  Also flonase nasal spray over the counter daily 2-3 weeks will help some of the swelling in your sinuses   Update if not starting to improve in a week or if worsening

## 2017-10-23 NOTE — Progress Notes (Signed)
Subjective:    Patient ID: Colton Porter, male    DOB: 03-03-1966, 52 y.o.   MRN: [id number]  HPI  Here for congestion/sinus symptoms   Symptoms for 9-10 days  Congestion and headache Teeth and ear pain  Symptoms come and go   Both ears hurt- this am woke him up  Goes from side to side =worse on L side today  Whole face hurts   Temp: 97.8 F (36.6 C)  No fever at home   Cannot get much nasal drainage out   Takes tylenol for pain   Drinks fluids   Patient Active Problem List   Diagnosis Date Noted  . Acute sinusitis 10/23/2017  . Pain of left heel 10/14/2017  . Anejaculation 08/09/2017  . Chronic gout 05/22/2017  . Foot swelling 04/12/2017  . Disorder of ejaculation 04/06/2017  . Dyspnea 11/18/2016  . BPH (benign prostatic hyperplasia) 10/05/2016  . Status post liver transplant ([place]) 03/15/2016  . Portal hypertensive gastropathy ([place]) 12/04/2015  . Type 2 diabetes mellitus with other specified complication ([place]) 40/98/1191  . Gynecomastia   . CKD (chronic kidney disease) stage 3, GFR 30-59 ml/min (HCC) 11/04/2015  . Depression, major, single episode, moderate ([place]) 07/09/2015  . Headache 02/19/2015  . Insomnia 12/06/2014  . Hypomagnesemia   . Other fatigue 11/18/2014  . Internal hemorrhoid 10/01/2014  . Erectile dysfunction 10/01/2014  . Alcoholic cirrhosis of liver without ascites ([place]) 07/28/2014  . (HFpEF) heart failure with preserved ejection fraction ([place]) 11/01/2013  . Essential hypertension 06/04/2013  . OSA on CPAP 07/11/2012  . History of alcohol dependence ([place]) 07/11/2012  . Thrombocytopenia ([place]) 12/15/2011  . Morbid obesity with BMI of 40.0-44.9, adult ([place]) 11/16/2011  . Routine general medical examination at a health care facility 09/22/2010  . NEUROPATHY 05/27/2009  . Pedal edema 09/22/2008  . Dyslipidemia 02/15/2007  . Allergic rhinitis 02/15/2007  . GERD 02/15/2007   Past Medical History:  Diagnosis Date  . Alcohol dependence ([place])  07/11/2012  . Alcoholic cirrhosis of liver with ascites ([place]) 07/2014   s./p transplant 12/2015  . Allergy   . Anemia   . Cellulitis of left leg   . Chronic diastolic heart failure ([place]) 11/01/2013  . CKD (chronic kidney disease) stage 3, GFR 30-59 ml/min (HCC) 11/04/2015  . GERD (gastroesophageal reflux disease)   . Hyperlipidemia   . Hypertension   . Neuropathy   . OSA on CPAP 07/11/2012   HST 07/2013:  AHI 39/hr.    . Thrombocytopenia ([place]) 12/15/2011   Past Surgical History:  Procedure Laterality Date  . COLONOSCOPY  08/2013   hyperplastic polyps, hemorrhoids Ardis [facility])  . 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 FINGER AMPUTATION  1997   left 4th finger - radial arm saw  . LEFT AND RIGHT HEART CATHETERIZATION WITH CORONARY ANGIOGRAM N/A 06/10/2013   Procedure: LEFT AND RIGHT HEART CATHETERIZATION WITH CORONARY ANGIOGRAM;  Surgeon: Blane Ohara, MD;  Location: [facility] CATH LAB;  Service: Cardiovascular;  Laterality: N/A;  . LIVER TRANSPLANTATION  [id number]   alcoholic cirrhosis ([facility])   Social History   Tobacco Use  . Smoking status: Never Smoker  . Smokeless tobacco: Former Systems developer    Types: Chew  Substance Use Topics  . Alcohol use: No    Alcohol/week: 0.0 oz    Comment: 4-6 drinks daily - NO ETOH SINCE APRIL 2016  . Drug use: Yes    Comment: remote use of marijuana in the  past, has since quit   Family History  Problem Relation Age of Onset  . Stroke Mother   . Emphysema Mother   . Hypertension Father   . Heart disease Father   . Emphysema Father   . Lung cancer Maternal Aunt   . Stroke Paternal Grandmother   . Heart attack Neg Hx    Allergies  Allergen Reactions  . Tolmetin Rash    cirrhosis  . Ambien [Zolpidem Tartrate] Other (See Comments)    Over-toxicity from liver failure  . Morphine And Related     Hallucinations.  . Hydrochlorothiazide W-Triamterene Other (See Comments)    REACTION: dizzy, nausea  .  Lisinopril Other (See Comments)    REACTION: cough, decreased libido   Current Outpatient Medications on File Prior to Visit  Medication Sig Dispense Refill  . acetaminophen (TYLENOL) 500 MG tablet Take 500 mg by mouth every 6 (six) hours as needed for mild pain.    Marland Kitchen allopurinol (ZYLOPRIM) 100 MG tablet Take 2 tablets (200 mg total) by mouth daily. 180 tablet 3  . amLODipine (NORVASC) 10 MG tablet Take 1 tablet (10 mg total) by mouth daily. 90 tablet 3  . ASPIR-LOW 81 MG EC tablet Take 1 tablet (81 mg total) by mouth daily. 90 tablet 3  . Blood Glucose Monitoring Suppl (ACCU-CHEK GUIDE) w/Device KIT 1 Units by Does not apply route as directed. 1 kit 0  . Cholecalciferol (VITAMIN D3) 2000 units TABS Take 1 tablet by mouth at bedtime.    . docusate sodium (COLACE) 100 MG capsule Take 100 mg by mouth 2 (two) times daily as needed for mild constipation.    Marland Kitchen doxazosin (CARDURA) 8 MG tablet Take 1 tablet (8 mg total) by mouth at bedtime. 90 tablet 3  . escitalopram (LEXAPRO) 10 MG tablet Take 1 tablet (10 mg total) by mouth daily. 90 tablet 3  . furosemide (LASIX) 40 MG tablet Take 1 tablet (40 mg total) by mouth daily. 90 tablet 3  . glimepiride (AMARYL) 1 MG tablet TAKE 1 TABLET (1 MG TOTAL) BY MOUTH DAILY WITH BREAKFAST. 90 tablet 1  . glucose blood (ACCU-CHEK GUIDE) test strip Check sugars three times daily and as needed when feeling ill E11.65, insulin use 100 each 11  . Insulin Glargine (LANTUS SOLOSTAR) 100 UNIT/ML Solostar Pen Inject 15 Units into the skin daily at 10 pm. 2 pen 3  . Insulin Pen Needle (B-D UF III MINI PEN NEEDLES) 31G X 5 MM MISC Use to inject insulin daily 100 each 3  . LANCETS ULTRA THIN 30G MISC Check sugars three times daily and as needed when feeling ill E11.65, insulin use 100 each 11  . loratadine (CLARITIN) 10 MG tablet Take 10 mg by mouth daily.    Marland Kitchen losartan (COZAAR) 50 MG tablet Take 1 tablet (50 mg total) by mouth daily. 90 tablet 3  . Multiple Vitamin  (MULTIVITAMIN WITH MINERALS) TABS tablet Take 2 tablets by mouth every evening.    . Omega-3 Fatty Acids (FISH OIL PO) Take 1 tablet by mouth at bedtime.    Marland Kitchen omeprazole (PRILOSEC) 40 MG capsule Take 1 capsule (40 mg total) by mouth daily. 90 capsule 3  . oxyCODONE (ROXICODONE) 5 MG immediate release tablet Take 1-2 tablets (5-10 mg total) by mouth every 4 (four) hours as needed for severe pain. 30 tablet 0  . tacrolimus (PROGRAF) 1 MG capsule Take 1-2 mg by mouth 2 (two) times daily. 2 mg in the morning and 1  mg in the evening    . tamsulosin (FLOMAX) 0.4 MG CAPS capsule Take 2 capsules (0.8 mg total) by mouth daily. 180 capsule 3  . VOLTAREN 1 % GEL Apply 2 g topically 3 (three) times daily. 100 g 1   No current facility-administered medications on file prior to visit.     Review of Systems  Constitutional: Positive for appetite change. Negative for fatigue and fever.  HENT: Positive for congestion, ear pain, postnasal drip, rhinorrhea, sinus pressure and sore throat. Negative for nosebleeds.   Eyes: Negative for pain, redness and itching.  Respiratory: Negative for cough, shortness of breath and wheezing.   Cardiovascular: Negative for chest pain.  Gastrointestinal: Negative for abdominal pain, diarrhea, nausea and vomiting.  Endocrine: Negative for polyuria.  Genitourinary: Negative for dysuria, frequency and urgency.  Musculoskeletal: Negative for arthralgias and myalgias.  Allergic/Immunologic: Negative for immunocompromised state.  Neurological: Positive for headaches. Negative for dizziness, tremors, syncope, weakness and numbness.  Hematological: Negative for adenopathy. Does not bruise/bleed easily.  Psychiatric/Behavioral: Negative for dysphoric mood. The patient is not nervous/anxious.        Objective:   Physical Exam  Constitutional: He appears well-developed and well-nourished. No distress.  obese and well appearing   HENT:  Head: Normocephalic and atraumatic.  Right  Ear: External ear normal.  Left Ear: External ear normal.  Mouth/Throat: Oropharynx is clear and moist. No oropharyngeal exudate.  Nares are injected and congested  Bilateral frontal and ethmoid sinus tenderness  Post nasal drip   Eyes: Pupils are equal, round, and reactive to light. Conjunctivae and EOM are normal. Right eye exhibits no discharge. Left eye exhibits no discharge.  Neck: Normal range of motion. Neck supple.  Cardiovascular: Normal rate and regular rhythm.  Pulmonary/Chest: Effort normal and breath sounds normal. No respiratory distress. He has no wheezes. He has no rales. He exhibits no tenderness.  Good air exch No rales or rhonchi   Musculoskeletal: Normal range of motion.  Lymphadenopathy:    He has no cervical adenopathy.  Neurological: He is alert. No cranial nerve deficit.  Skin: Skin is warm and dry. No rash noted. No erythema.  Psychiatric: He has a normal mood and affect.          Assessment & Plan:   Problem List Items Addressed This Visit      Respiratory   Acute sinusitis - Primary    10 d of sinus pain/ pressure after uri  Cover with augmentin  Fluids/rest/mucinex flonase may help  Disc symptomatic care - see instructions on AVS  Update if not starting to improve in a week or if worsening        Relevant Medications   amoxicillin-clavulanate (AUGMENTIN) 875-125 MG tablet

## 2017-11-01 DIAGNOSIS — G4733 Obstructive sleep apnea (adult) (pediatric): Secondary | ICD-10-CM | POA: Diagnosis not present

## 2017-11-08 DIAGNOSIS — Z944 Liver transplant status: Secondary | ICD-10-CM | POA: Diagnosis not present

## 2017-11-08 DIAGNOSIS — Z4823 Encounter for aftercare following liver transplant: Secondary | ICD-10-CM | POA: Diagnosis not present

## 2017-12-04 DIAGNOSIS — Z4823 Encounter for aftercare following liver transplant: Secondary | ICD-10-CM | POA: Diagnosis not present

## 2017-12-04 DIAGNOSIS — Z944 Liver transplant status: Secondary | ICD-10-CM | POA: Diagnosis not present

## 2017-12-29 ENCOUNTER — Ambulatory Visit: Payer: Medicare HMO | Admitting: Family Medicine

## 2017-12-29 ENCOUNTER — Encounter: Payer: Self-pay | Admitting: Family Medicine

## 2017-12-29 VITALS — BP 126/62 | HR 84 | Temp 98.2°F | Ht 67.0 in | Wt 284.5 lb

## 2017-12-29 DIAGNOSIS — H6521 Chronic serous otitis media, right ear: Secondary | ICD-10-CM | POA: Insufficient documentation

## 2017-12-29 DIAGNOSIS — J0100 Acute maxillary sinusitis, unspecified: Secondary | ICD-10-CM | POA: Diagnosis not present

## 2017-12-29 DIAGNOSIS — H6501 Acute serous otitis media, right ear: Secondary | ICD-10-CM | POA: Diagnosis not present

## 2017-12-29 MED ORDER — AMOXICILLIN-POT CLAVULANATE 875-125 MG PO TABS
1.0000 | ORAL_TABLET | Freq: Two times a day (BID) | ORAL | 0 refills | Status: DC
Start: 2017-12-29 — End: 2018-02-05

## 2017-12-29 NOTE — Assessment & Plan Note (Signed)
Treat with 10d augmentin course. Update if not improved with treatment.

## 2017-12-29 NOTE — Progress Notes (Signed)
BP 126/62 (BP Location: Left Arm, Patient Position: Sitting, Cuff Size: Large)   Pulse 84   Temp 98.2 F (36.8 C) (Oral)   Ht _0  (1.702 m)   Wt 284 lb 8 oz (129 kg)   SpO2 93%   BMI 44.56 kg/m    CC: R earache Subjective:    Patient ID: Colton Porter, male    DOB: 1965-11-08, 52 y.o.   MRN: [id number]  HPI: Colton Porter is a 52 y.o. male presenting on 12/29/2017 for Ear Pain (C/o right ear being stopped up for last 2 wks. Then had sharp pain start last night radiating into head and around to posterior neck. Says head pain was so bad, could not wear CPAP last night. )   Several week h/o R muffled hearing, then last night started having worsening pounding pain of ear, radiation to eyes, neck, tooth. Trouble sleeping last night. Chills last night. Some congestion and R facial pressure pain. ST this morning. Tried peroxide into ear.   No fevers, cough or wheezing.  No sick contacts at home.   Relevant past medical, surgical, family and social history reviewed and updated as indicated. Interim medical history since our last visit reviewed. Allergies and medications reviewed and updated. Outpatient Medications Prior to Visit  Medication Sig Dispense Refill  . acetaminophen (TYLENOL) 500 MG tablet Take 500 mg by mouth every 6 (six) hours as needed for mild pain.    Marland Kitchen allopurinol (ZYLOPRIM) 100 MG tablet Take 2 tablets (200 mg total) by mouth daily. 180 tablet 3  . amLODipine (NORVASC) 10 MG tablet Take 1 tablet (10 mg total) by mouth daily. 90 tablet 3  . ASPIR-LOW 81 MG EC tablet Take 1 tablet (81 mg total) by mouth daily. 90 tablet 3  . Blood Glucose Monitoring Suppl (ACCU-CHEK GUIDE) w/Device KIT 1 Units by Does not apply route as directed. 1 kit 0  . Cholecalciferol (VITAMIN D3) 2000 units TABS Take 1 tablet by mouth at bedtime.    . docusate sodium (COLACE) 100 MG capsule Take 100 mg by mouth 2 (two) times daily as needed for mild constipation.    Marland Kitchen doxazosin (CARDURA) 8 MG  tablet Take 1 tablet (8 mg total) by mouth at bedtime. 90 tablet 3  . escitalopram (LEXAPRO) 10 MG tablet Take 1 tablet (10 mg total) by mouth daily. 90 tablet 3  . furosemide (LASIX) 40 MG tablet Take 1 tablet (40 mg total) by mouth daily. 90 tablet 3  . glimepiride (AMARYL) 1 MG tablet TAKE 1 TABLET (1 MG TOTAL) BY MOUTH DAILY WITH BREAKFAST. 90 tablet 1  . glucose blood (ACCU-CHEK GUIDE) test strip Check sugars three times daily and as needed when feeling ill E11.65, insulin use 100 each 11  . Insulin Glargine (LANTUS SOLOSTAR) 100 UNIT/ML Solostar Pen Inject 15 Units into the skin daily at 10 pm. 2 pen 3  . Insulin Pen Needle (B-D UF III MINI PEN NEEDLES) 31G X 5 MM MISC Use to inject insulin daily 100 each 3  . LANCETS ULTRA THIN 30G MISC Check sugars three times daily and as needed when feeling ill E11.65, insulin use 100 each 11  . loratadine (CLARITIN) 10 MG tablet Take 10 mg by mouth daily.    Marland Kitchen losartan (COZAAR) 50 MG tablet Take 1 tablet (50 mg total) by mouth daily. 90 tablet 3  . Multiple Vitamin (MULTIVITAMIN WITH MINERALS) TABS tablet Take 2 tablets by mouth every evening.    Marland Kitchen  Omega-3 Fatty Acids (FISH OIL PO) Take 1 tablet by mouth at bedtime.    Marland Kitchen omeprazole (PRILOSEC) 40 MG capsule Take 1 capsule (40 mg total) by mouth daily. 90 capsule 3  . oxyCODONE (ROXICODONE) 5 MG immediate release tablet Take 1-2 tablets (5-10 mg total) by mouth every 4 (four) hours as needed for severe pain. 30 tablet 0  . tacrolimus (PROGRAF) 1 MG capsule Take 1-2 mg by mouth 2 (two) times daily. 2 mg in the morning and 1 mg in the evening    . tamsulosin (FLOMAX) 0.4 MG CAPS capsule Take 2 capsules (0.8 mg total) by mouth daily. 180 capsule 3  . VOLTAREN 1 % GEL Apply 2 g topically 3 (three) times daily. 100 g 1  . amoxicillin-clavulanate (AUGMENTIN) 875-125 MG tablet Take 1 tablet by mouth 2 (two) times daily. 14 tablet 0   No facility-administered medications prior to visit.      Per HPI unless  specifically indicated in ROS section below Review of Systems     Objective:    BP 126/62 (BP Location: Left Arm, Patient Position: Sitting, Cuff Size: Large)   Pulse 84   Temp 98.2 F (36.8 C) (Oral)   Ht _0  (1.702 m)   Wt 284 lb 8 oz (129 kg)   SpO2 93%   BMI 44.56 kg/m   Wt Readings from Last 3 Encounters:  12/29/17 284 lb 8 oz (129 kg)  10/23/17 274 lb 8 oz (124.5 kg)  10/12/17 275 lb (124.7 kg)    Physical Exam  Constitutional: He appears well-developed and well-nourished. No distress.  HENT:  Head: Normocephalic and atraumatic.  Right Ear: Hearing, external ear and ear canal normal.  Left Ear: Hearing, external ear and ear canal normal.  Nose: Mucosal edema (L>R nasal mucosal congestion) present. No rhinorrhea. Right sinus exhibits maxillary sinus tenderness. Right sinus exhibits no frontal sinus tenderness. Left sinus exhibits no maxillary sinus tenderness and no frontal sinus tenderness.  Mouth/Throat: Uvula is midline, oropharynx is clear and moist and mucous membranes are normal. No oropharyngeal exudate, posterior oropharyngeal edema, posterior oropharyngeal erythema or tonsillar abscesses.  Retracted TMs bilaterally, fluid behind R TM superiorly No erythema of TMs  Eyes: Pupils are equal, round, and reactive to light. Conjunctivae and EOM are normal. No scleral icterus.  Neck: Normal range of motion. Neck supple.  Lymphadenopathy:    He has no cervical adenopathy.  Skin: Skin is warm and dry. No rash noted.  Nursing note and vitals reviewed.     Assessment & Plan:   Problem List Items Addressed This Visit    Acute sinusitis - Primary    Treat with 10d augmentin course. Update if not improved with treatment.       Relevant Medications   amoxicillin-clavulanate (AUGMENTIN) 875-125 MG tablet   Acute serous otitis media of right ear    Augmentin course. Update if not improving for ENT referral. Pt agrees with plan.      Relevant Medications    amoxicillin-clavulanate (AUGMENTIN) 875-125 MG tablet       Meds ordered this encounter  Medications  . amoxicillin-clavulanate (AUGMENTIN) 875-125 MG tablet    Sig: Take 1 tablet by mouth 2 (two) times daily.    Dispense:  20 tablet    Refill:  0   No orders of the defined types were placed in this encounter.   Follow up plan: Return if symptoms worsen or fail to improve.  Ria Bush, MD

## 2017-12-29 NOTE — Assessment & Plan Note (Signed)
Augmentin course. Update if not improving for ENT referral. Pt agrees with plan.

## 2017-12-29 NOTE — Patient Instructions (Signed)
I do think you have sinusitis with fluid in the right ear.  Treat with augmentin course. Let us know if not improving with treatment for referral to ENT.

## 2018-01-02 DIAGNOSIS — D899 Disorder involving the immune mechanism, unspecified: Secondary | ICD-10-CM | POA: Diagnosis not present

## 2018-01-02 DIAGNOSIS — N189 Chronic kidney disease, unspecified: Secondary | ICD-10-CM | POA: Diagnosis not present

## 2018-01-05 ENCOUNTER — Ambulatory Visit: Payer: Self-pay | Admitting: *Deleted

## 2018-01-05 DIAGNOSIS — H6501 Acute serous otitis media, right ear: Secondary | ICD-10-CM

## 2018-01-05 NOTE — Addendum Note (Signed)
Addended by: Ria Bush on: 01/05/2018 02:03 PM   Modules accepted: Orders

## 2018-01-05 NOTE — Telephone Encounter (Signed)
ENT Appt made and patient notified. 

## 2018-01-05 NOTE — Telephone Encounter (Signed)
ENT referral placed.

## 2018-01-05 NOTE — Telephone Encounter (Signed)
Pt "was told to call with update, he was put on abx last week", last Friday. Pt states it is still pretty bad, - ear stopped up all the time and ear pain at night.  Pt was told by provider a referral to ent may be needed. He is taking oxycodone for the pain that he had and has helped some. Now about out of this medicine. Tylenol does not help. He is ready to be recommended to an ENT. No fever, just the ear pain. Pt was not triaged. Will route to  [facility] at [facility]. Pt requesting a call back.

## 2018-01-08 DIAGNOSIS — H6531 Chronic mucoid otitis media, right ear: Secondary | ICD-10-CM | POA: Diagnosis not present

## 2018-01-10 DIAGNOSIS — N183 Chronic kidney disease, stage 3 (moderate): Secondary | ICD-10-CM | POA: Diagnosis not present

## 2018-01-10 DIAGNOSIS — N2581 Secondary hyperparathyroidism of renal origin: Secondary | ICD-10-CM | POA: Diagnosis not present

## 2018-01-10 DIAGNOSIS — D631 Anemia in chronic kidney disease: Secondary | ICD-10-CM | POA: Diagnosis not present

## 2018-01-10 DIAGNOSIS — I129 Hypertensive chronic kidney disease with stage 1 through stage 4 chronic kidney disease, or unspecified chronic kidney disease: Secondary | ICD-10-CM | POA: Diagnosis not present

## 2018-01-10 DIAGNOSIS — N189 Chronic kidney disease, unspecified: Secondary | ICD-10-CM | POA: Diagnosis not present

## 2018-01-19 DIAGNOSIS — H6521 Chronic serous otitis media, right ear: Secondary | ICD-10-CM | POA: Diagnosis not present

## 2018-01-22 ENCOUNTER — Telehealth: Payer: Self-pay | Admitting: Family Medicine

## 2018-01-22 ENCOUNTER — Other Ambulatory Visit: Payer: Self-pay | Admitting: Family Medicine

## 2018-01-22 NOTE — Telephone Encounter (Signed)
Copied from [place] [id number]. Topic: Quick Communication - Rx Refill/Question >> Jan 22, 2018 10:31 AM Judyann Munson wrote: Medication: tamsulosin (FLOMAX) 0.4 MG CAPS capsule  Has the patient contacted their pharmacy? No   Preferred Pharmacy (with phone number or street name): [facility], [place] [phone] (Phone) [phone] (Fax)      Agent: Please be advised that RX refills may take up to 3 business days. We ask that you follow-up with your pharmacy.

## 2018-01-23 NOTE — Telephone Encounter (Signed)
Refill sent in

## 2018-02-02 DIAGNOSIS — G4733 Obstructive sleep apnea (adult) (pediatric): Secondary | ICD-10-CM | POA: Diagnosis not present

## 2018-02-03 ENCOUNTER — Encounter: Payer: Self-pay | Admitting: Family Medicine

## 2018-02-05 ENCOUNTER — Encounter: Payer: Self-pay | Admitting: Family Medicine

## 2018-02-05 ENCOUNTER — Ambulatory Visit: Payer: Medicare HMO | Admitting: Family Medicine

## 2018-02-05 VITALS — BP 120/70 | HR 76 | Temp 98.0°F | Ht 63.0 in | Wt 283.5 lb

## 2018-02-05 DIAGNOSIS — E1169 Type 2 diabetes mellitus with other specified complication: Secondary | ICD-10-CM | POA: Diagnosis not present

## 2018-02-05 DIAGNOSIS — G8929 Other chronic pain: Secondary | ICD-10-CM | POA: Diagnosis not present

## 2018-02-05 DIAGNOSIS — H6192 Disorder of left external ear, unspecified: Secondary | ICD-10-CM | POA: Diagnosis not present

## 2018-02-05 DIAGNOSIS — Z944 Liver transplant status: Secondary | ICD-10-CM | POA: Diagnosis not present

## 2018-02-05 DIAGNOSIS — Z6841 Body Mass Index (BMI) 40.0 and over, adult: Secondary | ICD-10-CM

## 2018-02-05 DIAGNOSIS — Z794 Long term (current) use of insulin: Secondary | ICD-10-CM | POA: Diagnosis not present

## 2018-02-05 DIAGNOSIS — M79672 Pain in left foot: Secondary | ICD-10-CM | POA: Diagnosis not present

## 2018-02-05 DIAGNOSIS — H6521 Chronic serous otitis media, right ear: Secondary | ICD-10-CM | POA: Diagnosis not present

## 2018-02-05 DIAGNOSIS — Z4823 Encounter for aftercare following liver transplant: Secondary | ICD-10-CM | POA: Diagnosis not present

## 2018-02-05 LAB — POCT GLYCOSYLATED HEMOGLOBIN (HGB A1C): Hemoglobin A1C: 6.4 % — AB (ref 4.0–5.6)

## 2018-02-05 NOTE — Patient Instructions (Addendum)
You are doing well today Watch diabetic diet A1c today.  Return in 4 months for f/u visit We can refer you to podiatry if heel pain no better with exercises provided today (plantar fasciitis vs heel spur)

## 2018-02-05 NOTE — Assessment & Plan Note (Signed)
Poorly healing wound L external ear for months.  rec daily abx ointment and if not healing in 1-2 wks update for derm referral.

## 2018-02-05 NOTE — Assessment & Plan Note (Signed)
Pt states this is 70% better, still with intermittent muffled hearing. TM has healed well.

## 2018-02-05 NOTE — Assessment & Plan Note (Signed)
Chronic, stable. Continue current medications. Update A1c. Weight gain noted.

## 2018-02-05 NOTE — Progress Notes (Signed)
BP 120/70 (BP Location: Left Arm, Patient Position: Sitting, Cuff Size: Large)   Pulse 76   Temp 98 F (36.7 C) (Oral)   Ht '5\' 3"'$  (1.6 m)   Wt 283 lb 8 oz (128.6 kg)   SpO2 95%   BMI 50.22 kg/m    CC: 4 mo f/u visit Subjective:    Patient ID: Colton Porter, male    DOB: 19-May-1966, 52 y.o.   MRN: [id number]  HPI: Colton Porter is a 52 y.o. male presenting on 02/05/2018 for 3-4 mo follow up   Recent chronic R serous otitis s/p ENT eval with phenol myringotomy by Dr Lucia Gaskins. Persistent intermittent R hearing loss.   CKD established with Dr Posey Pronto @ [facility].  DM - does not regularly check sugars. Compliant with antihyperglycemic regimen which includes: amaryl '1mg'$  (takes with dinner), lantus '15mg'$  daily. Denies low sugars or hypoglycemic symptoms. Denies paresthesias. Last diabetic eye exam 06/2017. Pneumovax: 2016. Prevnar: not due. Glucometer brand: accucheck. DSME: completed. Lab Results  Component Value Date   HGBA1C 6.4 (A) 02/05/2018   Diabetic Foot Exam - Simple   Simple Foot Form Diabetic Foot exam was performed with the following findings:  Yes 02/05/2018  8:21 AM  Visual Inspection No deformities, no ulcerations, no other skin breakdown bilaterally:  Yes Sensation Testing Intact to touch and monofilament testing bilaterally:  Yes Pulse Check Posterior Tibialis and Dorsalis pulse intact bilaterally:  Yes Comments    Lab Results  Component Value Date   MICROALBUR <0.7 08/27/2014     Relevant past medical, surgical, family and social history reviewed and updated as indicated. Interim medical history since our last visit reviewed. Allergies and medications reviewed and updated. Outpatient Medications Prior to Visit  Medication Sig Dispense Refill  . acetaminophen (TYLENOL) 500 MG tablet Take 500 mg by mouth every 6 (six) hours as needed for mild pain.    Marland Kitchen allopurinol (ZYLOPRIM) 100 MG tablet Take 2 tablets (200 mg total) by mouth daily. 180 tablet 3  . amLODipine  (NORVASC) 10 MG tablet Take 1 tablet (10 mg total) by mouth daily. 90 tablet 3  . ASPIR-LOW 81 MG EC tablet Take 1 tablet (81 mg total) by mouth daily. 90 tablet 3  . Blood Glucose Monitoring Suppl (ACCU-CHEK GUIDE) w/Device KIT 1 Units by Does not apply route as directed. 1 kit 0  . Cholecalciferol (VITAMIN D3) 2000 units TABS Take 1 tablet by mouth at bedtime.    . docusate sodium (COLACE) 100 MG capsule Take 100 mg by mouth 2 (two) times daily as needed for mild constipation.    Marland Kitchen doxazosin (CARDURA) 8 MG tablet Take 1 tablet (8 mg total) by mouth at bedtime. 90 tablet 3  . escitalopram (LEXAPRO) 10 MG tablet Take 1 tablet (10 mg total) by mouth daily. 90 tablet 3  . furosemide (LASIX) 40 MG tablet Take 1 tablet (40 mg total) by mouth daily. 90 tablet 3  . glimepiride (AMARYL) 1 MG tablet TAKE 1 TABLET (1 MG TOTAL) BY MOUTH DAILY WITH BREAKFAST. 90 tablet 1  . glucose blood (ACCU-CHEK GUIDE) test strip Check sugars three times daily and as needed when feeling ill E11.65, insulin use 100 each 11  . Insulin Glargine (LANTUS SOLOSTAR) 100 UNIT/ML Solostar Pen Inject 15 Units into the skin daily at 10 pm. 2 pen 3  . Insulin Pen Needle (B-D UF III MINI PEN NEEDLES) 31G X 5 MM MISC Use to inject insulin daily 100 each 3  .  LANCETS ULTRA THIN 30G MISC Check sugars three times daily and as needed when feeling ill E11.65, insulin use 100 each 11  . loratadine (CLARITIN) 10 MG tablet Take 10 mg by mouth daily.    Marland Kitchen losartan (COZAAR) 50 MG tablet Take 1 tablet (50 mg total) by mouth daily. 90 tablet 3  . Multiple Vitamin (MULTIVITAMIN WITH MINERALS) TABS tablet Take 2 tablets by mouth every evening.    . Omega-3 Fatty Acids (FISH OIL PO) Take 1 tablet by mouth at bedtime.    Marland Kitchen omeprazole (PRILOSEC) 40 MG capsule Take 1 capsule (40 mg total) by mouth daily. 90 capsule 3  . oxyCODONE (ROXICODONE) 5 MG immediate release tablet Take 1-2 tablets (5-10 mg total) by mouth every 4 (four) hours as needed for severe  pain. 30 tablet 0  . tacrolimus (PROGRAF) 1 MG capsule Take 1-2 mg by mouth 2 (two) times daily. 2 mg in the morning and 1 mg in the evening    . tamsulosin (FLOMAX) 0.4 MG CAPS capsule TAKE 2 CAPSULES (0.8 MG TOTAL) BY MOUTH DAILY. 180 capsule 3  . VOLTAREN 1 % GEL Apply 2 g topically 3 (three) times daily. 100 g 1  . amoxicillin-clavulanate (AUGMENTIN) 875-125 MG tablet Take 1 tablet by mouth 2 (two) times daily. 20 tablet 0   No facility-administered medications prior to visit.      Per HPI unless specifically indicated in ROS section below Review of Systems     Objective:    BP 120/70 (BP Location: Left Arm, Patient Position: Sitting, Cuff Size: Large)   Pulse 76   Temp 98 F (36.7 C) (Oral)   Ht '5\' 3"'$  (1.6 m)   Wt 283 lb 8 oz (128.6 kg)   SpO2 95%   BMI 50.22 kg/m   Wt Readings from Last 3 Encounters:  02/05/18 283 lb 8 oz (128.6 kg)  12/29/17 284 lb 8 oz (129 kg)  10/23/17 274 lb 8 oz (124.5 kg)    Physical Exam  Constitutional: He appears well-developed and well-nourished. No distress.  HENT:  Head: Normocephalic and atraumatic.  Right Ear: Ear canal normal.  Left Ear: External ear and ear canal normal.  Ears:  Nose: Nose normal.  Mouth/Throat: Oropharynx is clear and moist. No oropharyngeal exudate.  R TM perforation healed L external ear with poorly healing ulcer  Eyes: Pupils are equal, round, and reactive to light. Conjunctivae and EOM are normal. No scleral icterus.  Neck: Normal range of motion. Neck supple.  Cardiovascular: Normal rate, regular rhythm, normal heart sounds and intact distal pulses.  No murmur heard. Pulmonary/Chest: Effort normal and breath sounds normal. No respiratory distress. He has no wheezes. He has no rales.  Musculoskeletal: He exhibits no edema.  See HPI for foot exam if done Tender to palpation proximal sole at L heel No pain with calcaneal squeeze No pain at achilles insertion or tendon  Lymphadenopathy:    He has no  cervical adenopathy.  Skin: Skin is warm and dry. No rash noted.  Psychiatric: He has a normal mood and affect.  Nursing note and vitals reviewed.  Results for orders placed or performed in visit on 02/05/18  POCT glycosylated hemoglobin (Hb A1C)  Result Value Ref Range   Hemoglobin A1C 6.4 (A) 4.0 - 5.6 %   HbA1c POC (<> result, manual entry)     HbA1c, POC (prediabetic range)     HbA1c, POC (controlled diabetic range)        Assessment &  Plan:   Problem List Items Addressed This Visit    Type 2 diabetes mellitus with other specified complication ([place]) - Primary    Chronic, stable. Continue current medications. Update A1c. Weight gain noted.       Relevant Orders   POCT glycosylated hemoglobin (Hb A1C) (Completed)   Morbid obesity with BMI of 50.0-59.9, adult ([place])    Reviewed weight gain noted. Encouraged weight sustainable loss through healthy diet and lifestyle changes.       Earlobe lesion, left    Poorly healing wound L external ear for months.  rec daily abx ointment and if not healing in 1-2 wks update for derm referral.       Chronic serous otitis media of right ear    Pt states this is 70% better, still with intermittent muffled hearing. TM has healed well.       Chronic heel pain, left    Plantar fasciitis vs heel spur. Plantar fasciitis exercises provided today. Update if not improving for podiatry referral           No orders of the defined types were placed in this encounter.  Orders Placed This Encounter  Procedures  . POCT glycosylated hemoglobin (Hb A1C)    Follow up plan: Return in about 4 months (around 06/07/2018), or if symptoms worsen or fail to improve, for follow up visit.  Ria Bush, MD

## 2018-02-05 NOTE — Assessment & Plan Note (Signed)
Reviewed weight gain noted. Encouraged weight sustainable loss through healthy diet and lifestyle changes.

## 2018-02-05 NOTE — Assessment & Plan Note (Signed)
Plantar fasciitis vs heel spur. Plantar fasciitis exercises provided today. Update if not improving for podiatry referral

## 2018-02-20 ENCOUNTER — Encounter: Payer: Self-pay | Admitting: Family Medicine

## 2018-02-20 ENCOUNTER — Ambulatory Visit (INDEPENDENT_AMBULATORY_CARE_PROVIDER_SITE_OTHER): Payer: Medicare HMO | Admitting: Family Medicine

## 2018-02-20 VITALS — BP 138/76 | HR 74 | Temp 98.0°F | Ht 66.5 in | Wt 286.0 lb

## 2018-02-20 DIAGNOSIS — R519 Headache, unspecified: Secondary | ICD-10-CM

## 2018-02-20 DIAGNOSIS — R51 Headache: Secondary | ICD-10-CM

## 2018-02-20 LAB — C-REACTIVE PROTEIN: CRP: 2.5 mg/dL (ref 0.5–20.0)

## 2018-02-20 LAB — SEDIMENTATION RATE: SED RATE: 53 mm/h — AB (ref 0–20)

## 2018-02-20 MED ORDER — RIZATRIPTAN BENZOATE 10 MG PO TABS: 10.0000 mg | ORAL_TABLET | ORAL | 0 refills | Status: DC | PRN

## 2018-02-20 NOTE — Patient Instructions (Signed)
I wonder if you are having a migraine variant   We will check some labs to rule out another condition called temporal arteritis   Drink water - (64 oz fluid a day -mostly water)  Gradually cut caffeine (1 cup max per day)  Try the maxalt to see if this breaks the headache cycle   If it does not help-please let us know so we can try something else   Take care of yourself   Also get to the dentist if you need to

## 2018-02-20 NOTE — Assessment & Plan Note (Signed)
Reassuring exam and no findings for acute sinusitis  Disc poss of migraine (sounds like he has had in the past and resp to maxalt) Less likely temporal arteritis Check ESR and CRP today  Trial of maxalt 10 mg  Also good hydration/cut back caffeine and sugar Headache prevention lifestyle changes discussed  Handout given  Enc f/u with dentist also for ongoing dental problems Will alert PCP

## 2018-02-20 NOTE — Progress Notes (Signed)
Subjective:    Patient ID: Colton Porter, male    DOB: 01-Apr-1966, 52 y.o.   MRN: [id number]  HPI Here for sinus headache and congestion    He was tx for acute sinusitis 7/5 by Dr Darnell Level his PCP With 10 d of augmentin   He did get better- but had to see ENT for effusion- did "poke hole" in ear with relief (no tube)- put him on 10 more days of abx (augmentin again)   Now he started symptoms 5 d ago  It does not feel like a cold  R upper teeth hurt- radiates into forehead  Not getting much out of nose-no congestion at all   R neck and shoulder also hurt  No change in vision at all   No pnd No ST  No fever   Throbs at times /other times constant  Has taken tylenol a few times/ helped   Remote hx of migraines Family hx also   Wt Readings from Last 3 Encounters:  02/20/18 286 lb (129.7 kg)  02/05/18 283 lb 8 oz (128.6 kg)  12/29/17 284 lb 8 oz (129 kg)   45.47 kg/m   Trying to loose weight  Active -but has heel pain/ cannot walk more  No eating well however (is diabetic)  Drinks 2-3 cups of coffee in am  About 48 oz of water a day     BP Readings from Last 3 Encounters:  02/20/18 138/76  02/05/18 120/70  12/29/17 126/62   Pulse Readings from Last 3 Encounters:  02/20/18 74  02/05/18 76  12/29/17 84    Has dental problems Sensitive teeth R side that may need to be pulled   Patient Active Problem List   Diagnosis Date Noted  . Earlobe lesion, left 02/05/2018  . Chronic serous otitis media of right ear 12/29/2017  . Acute sinusitis 10/23/2017  . Chronic heel pain, left 10/14/2017  . Anejaculation 08/09/2017  . Chronic gout 05/22/2017  . Foot swelling 04/12/2017  . Disorder of ejaculation 04/06/2017  . Dyspnea 11/18/2016  . BPH (benign prostatic hyperplasia) 10/05/2016  . Status post liver transplant ([place]) 03/15/2016  . Portal hypertensive gastropathy ([place]) 12/04/2015  . Type 2 diabetes mellitus with other specified complication ([place]) 17/49/4496  .  Gynecomastia   . CKD (chronic kidney disease) stage 3, GFR 30-59 ml/min ([facility]) 11/04/2015  . Depression, major, single episode, moderate ([place]) 07/09/2015  . Headache 02/19/2015  . Insomnia 12/06/2014  . Hypomagnesemia   . Other fatigue 11/18/2014  . Internal hemorrhoid 10/01/2014  . Erectile dysfunction 10/01/2014  . Alcoholic cirrhosis of liver without ascites ([place]) 07/28/2014  . (HFpEF) heart failure with preserved ejection fraction ([place]) 11/01/2013  . Essential hypertension 06/04/2013  . OSA on CPAP 07/11/2012  . History of alcohol dependence ([place]) 07/11/2012  . Thrombocytopenia ([place]) 12/15/2011  . Morbid obesity with BMI of 50.0-59.9, adult ([place]) 11/16/2011  . Routine general medical examination at a health care facility 09/22/2010  . NEUROPATHY 05/27/2009  . Pedal edema 09/22/2008  . Dyslipidemia 02/15/2007  . Allergic rhinitis 02/15/2007  . GERD 02/15/2007   Past Medical History:  Diagnosis Date  . Alcohol dependence ([place]) 07/11/2012  . Alcoholic cirrhosis of liver with ascites ([place]) 07/2014   s./p transplant 12/2015  . Allergy   . Anemia   . Cellulitis of left leg   . Chronic diastolic heart failure ([place]) 11/01/2013  . CKD (chronic kidney disease) stage 3, GFR 30-59 ml/min ([facility]) 11/04/2015  . GERD (gastroesophageal  reflux disease)   . Hyperlipidemia   . Hypertension   . Neuropathy   . OSA on CPAP 07/11/2012   HST 07/2013:  AHI 39/hr.    . Thrombocytopenia ([place]) 12/15/2011   Past Surgical History:  Procedure Laterality Date  . COLONOSCOPY  08/2013   hyperplastic polyps, hemorrhoids Ardis Hughs)  . 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 FINGER AMPUTATION  1997   left 4th finger - radial arm saw  . LEFT AND RIGHT HEART CATHETERIZATION WITH CORONARY ANGIOGRAM N/A 06/10/2013   Procedure: LEFT AND RIGHT HEART CATHETERIZATION WITH CORONARY ANGIOGRAM;  Surgeon: Blane Ohara, MD;  Location: [facility] CATH LAB;  Service: Cardiovascular;   Laterality: N/A;  . LIVER TRANSPLANTATION  17/6160   alcoholic cirrhosis ([facility] at [facility])   Social History   Tobacco Use  . Smoking status: Never Smoker  . Smokeless tobacco: Former Systems developer    Types: Chew  Substance Use Topics  . Alcohol use: No    Alcohol/week: 0.0 standard drinks    Comment: 4-6 drinks daily - NO ETOH SINCE APRIL 2016  . Drug use: Yes    Comment: remote use of marijuana in the past, has since quit   Family History  Problem Relation Age of Onset  . Stroke Mother   . Emphysema Mother   . Hypertension Father   . Heart disease Father   . Emphysema Father   . Lung cancer Maternal Aunt   . Stroke Paternal Grandmother   . Heart attack Neg Hx    Allergies  Allergen Reactions  . Tolmetin Rash    cirrhosis  . Ambien [Zolpidem Tartrate] Other (See Comments)    Over-toxicity from liver failure  . Morphine And Related     Hallucinations.  . Hydrochlorothiazide W-Triamterene Other (See Comments)    REACTION: dizzy, nausea  . Lisinopril Other (See Comments)    REACTION: cough, decreased libido   Current Outpatient Medications on File Prior to Visit  Medication Sig Dispense Refill  . acetaminophen (TYLENOL) 500 MG tablet Take 500 mg by mouth every 6 (six) hours as needed for mild pain.    Marland Kitchen allopurinol (ZYLOPRIM) 100 MG tablet Take 2 tablets (200 mg total) by mouth daily. 180 tablet 3  . amLODipine (NORVASC) 10 MG tablet Take 1 tablet (10 mg total) by mouth daily. 90 tablet 3  . ASPIR-LOW 81 MG EC tablet Take 1 tablet (81 mg total) by mouth daily. 90 tablet 3  . Blood Glucose Monitoring Suppl (ACCU-CHEK GUIDE) w/Device KIT 1 Units by Does not apply route as directed. 1 kit 0  . Cholecalciferol (VITAMIN D3) 2000 units TABS Take 1 tablet by mouth at bedtime.    . docusate sodium (COLACE) 100 MG capsule Take 100 mg by mouth 2 (two) times daily as needed for mild constipation.    Marland Kitchen doxazosin (CARDURA) 8 MG tablet Take 1 tablet (8 mg total) by mouth at bedtime. 90  tablet 3  . escitalopram (LEXAPRO) 10 MG tablet Take 1 tablet (10 mg total) by mouth daily. 90 tablet 3  . furosemide (LASIX) 40 MG tablet Take 1 tablet (40 mg total) by mouth daily. 90 tablet 3  . glimepiride (AMARYL) 1 MG tablet TAKE 1 TABLET (1 MG TOTAL) BY MOUTH DAILY WITH BREAKFAST. 90 tablet 1  . glucose blood (ACCU-CHEK GUIDE) test strip Check sugars three times daily and as needed when feeling ill E11.65, insulin use 100 each 11  . Insulin Glargine (LANTUS SOLOSTAR) 100  UNIT/ML Solostar Pen Inject 15 Units into the skin daily at 10 pm. 2 pen 3  . Insulin Pen Needle (B-D UF III MINI PEN NEEDLES) 31G X 5 MM MISC Use to inject insulin daily 100 each 3  . LANCETS ULTRA THIN 30G MISC Check sugars three times daily and as needed when feeling ill E11.65, insulin use 100 each 11  . loratadine (CLARITIN) 10 MG tablet Take 10 mg by mouth daily.    Marland Kitchen losartan (COZAAR) 50 MG tablet Take 1 tablet (50 mg total) by mouth daily. 90 tablet 3  . Multiple Vitamin (MULTIVITAMIN WITH MINERALS) TABS tablet Take 2 tablets by mouth every evening.    . Omega-3 Fatty Acids (FISH OIL PO) Take 1 tablet by mouth at bedtime.    Marland Kitchen omeprazole (PRILOSEC) 40 MG capsule Take 1 capsule (40 mg total) by mouth daily. 90 capsule 3  . oxyCODONE (ROXICODONE) 5 MG immediate release tablet Take 1-2 tablets (5-10 mg total) by mouth every 4 (four) hours as needed for severe pain. 30 tablet 0  . tacrolimus (PROGRAF) 1 MG capsule Take 1-2 mg by mouth 2 (two) times daily. 2 mg in the morning and 1 mg in the evening    . tamsulosin (FLOMAX) 0.4 MG CAPS capsule TAKE 2 CAPSULES (0.8 MG TOTAL) BY MOUTH DAILY. 180 capsule 3  . VOLTAREN 1 % GEL Apply 2 g topically 3 (three) times daily. 100 g 1   No current facility-administered medications on file prior to visit.     Review of Systems  Constitutional: Negative for activity change, appetite change, fatigue, fever and unexpected weight change.  HENT: Negative for congestion, rhinorrhea,  sore throat and trouble swallowing.   Eyes: Negative for pain, redness, itching and visual disturbance.  Respiratory: Negative for cough, chest tightness, shortness of breath and wheezing.   Cardiovascular: Negative for chest pain and palpitations.  Gastrointestinal: Negative for abdominal pain, blood in stool, constipation, diarrhea and nausea.  Endocrine: Negative for cold intolerance, heat intolerance, polydipsia and polyuria.  Genitourinary: Negative for difficulty urinating, dysuria, frequency and urgency.  Musculoskeletal: Negative for arthralgias, joint swelling and myalgias.  Skin: Negative for pallor and rash.  Neurological: Positive for headaches. Negative for dizziness, tremors, seizures, syncope, facial asymmetry, speech difficulty, weakness, light-headedness and numbness.  Hematological: Negative for adenopathy. Does not bruise/bleed easily.  Psychiatric/Behavioral: Negative for decreased concentration and dysphoric mood. The patient is not nervous/anxious.        Mood is ok        Objective:   Physical Exam  Constitutional: He is oriented to person, place, and time. He appears well-developed and well-nourished. No distress.  obese and well appearing   HENT:  Head: Normocephalic and atraumatic.  Right Ear: External ear normal.  Left Ear: External ear normal.  Nose: Nose normal.  Mouth/Throat: Oropharynx is clear and moist. No oropharyngeal exudate.  No sinus tenderness Mild right temporal tenderness No TMJ tenderness  Eyes: Pupils are equal, round, and reactive to light. Conjunctivae and EOM are normal. Right eye exhibits no discharge. Left eye exhibits no discharge. No scleral icterus.  No nystagmus  Neck: Normal range of motion and full passive range of motion without pain. Neck supple. No JVD present. Carotid bruit is not present. No neck rigidity. No tracheal deviation present. No thyromegaly present.  Cardiovascular: Normal rate, regular rhythm and normal heart  sounds.  No murmur heard. Pulmonary/Chest: Effort normal and breath sounds normal. No respiratory distress. He has no wheezes. He has  no rales.  Abdominal: Soft. Bowel sounds are normal.  Musculoskeletal: He exhibits no edema or tenderness.  Lymphadenopathy:    He has no cervical adenopathy.  Neurological: He is alert and oriented to person, place, and time. He has normal strength and normal reflexes. He displays no atrophy, no tremor and normal reflexes. No cranial nerve deficit or sensory deficit. He exhibits normal muscle tone. He displays a negative Romberg sign. Coordination and gait normal.  No focal cerebellar signs   Skin: Skin is warm and dry. Capillary refill takes less than 2 seconds. No rash noted. No erythema. No pallor.  Psychiatric: He has a normal mood and affect. His behavior is normal. Thought content normal. His mood appears not anxious.          Assessment & Plan:   Problem List Items Addressed This Visit      Other   Headache - Primary    Reassuring exam and no findings for acute sinusitis  Disc poss of migraine (sounds like he has had in the past and resp to maxalt) Less likely temporal arteritis Check ESR and CRP today  Trial of maxalt 10 mg  Also good hydration/cut back caffeine and sugar Headache prevention lifestyle changes discussed  Handout given  Enc f/u with dentist also for ongoing dental problems Will alert PCP        Relevant Medications   rizatriptan (MAXALT) 10 MG tablet   Other Relevant Orders   Sedimentation Rate (Completed)   C-reactive protein (Completed)

## 2018-02-21 ENCOUNTER — Telehealth: Payer: Self-pay | Admitting: Family Medicine

## 2018-02-21 MED ORDER — AMOXICILLIN-POT CLAVULANATE 875-125 MG PO TABS: 1.0000 | ORAL_TABLET | Freq: Two times a day (BID) | ORAL | 0 refills | Status: DC

## 2018-02-21 NOTE — Telephone Encounter (Signed)
Spoke with pt relaying Dr. G's message. Pt verbalizes understanding.  

## 2018-02-21 NOTE — Telephone Encounter (Signed)
Spoke with pt to confirm he received rx. Says he did but it is not helping HA.  Says he feels like the sinus infection is back.  In addition to HA, c/o ear pain, sinus congestion, face pain and teeth pain.

## 2018-02-21 NOTE — Telephone Encounter (Signed)
Touched base with Dr Darene Lamer. Let's treat with extended 2 wk augmentin course sent to pharmacy. Call us after he finishes treatment with update on how he's doing. Let us know sooner if worsening.

## 2018-02-21 NOTE — Telephone Encounter (Signed)
Copied from [place] [id number]. Topic: Quick Communication - See Telephone Encounter >> Feb 21, 2018 12:27 PM Neva Seat wrote: rizatriptan (MAXALT) 10 MG tablet

## 2018-02-28 MED ORDER — DOXYCYCLINE HYCLATE 100 MG PO TABS: 100.0000 mg | ORAL_TABLET | Freq: Two times a day (BID) | ORAL | 0 refills | Status: DC

## 2018-02-28 NOTE — Telephone Encounter (Signed)
Pt calling to advise he is NOT getting any better.  Pt has headache, ear ache, teeth hurting, and all this on the right side.

## 2018-02-28 NOTE — Telephone Encounter (Signed)
Stop augmentin. Start doxycycline. Schedule f/u appt in office - can he come in Friday?

## 2018-02-28 NOTE — Telephone Encounter (Signed)
Spoke with pt relaying instructions per Dr. Darnell Level.  Pt states he can only do a morning appt on Fri, 03/02/18.    Per Dr. Darnell Level, the 10:15 same day slot can be used to schedule pt.  Dr. Darnell Level asks that one of his open slots be changed to a same day.  Will you schedule this f/u OV?

## 2018-02-28 NOTE — Addendum Note (Signed)
Addended by: Ria Bush on: 02/28/2018 02:56 PM   Modules accepted: Orders

## 2018-02-28 NOTE — Telephone Encounter (Signed)
Noted  

## 2018-03-01 NOTE — Telephone Encounter (Signed)
Pt is scheduled for Fri, 03/02/18 at 10:15 AM and is aware of appt.

## 2018-03-02 ENCOUNTER — Ambulatory Visit (INDEPENDENT_AMBULATORY_CARE_PROVIDER_SITE_OTHER): Payer: Medicare HMO | Admitting: Family Medicine

## 2018-03-02 ENCOUNTER — Encounter: Payer: Self-pay | Admitting: Family Medicine

## 2018-03-02 VITALS — BP 122/74 | HR 88 | Temp 98.7°F | Ht 65.5 in | Wt 286.8 lb

## 2018-03-02 DIAGNOSIS — J0191 Acute recurrent sinusitis, unspecified: Secondary | ICD-10-CM

## 2018-03-02 NOTE — Progress Notes (Signed)
BP 122/74 (BP Location: Left Arm, Patient Position: Sitting, Cuff Size: Large)   Pulse 88   Temp 98.7 F (37.1 C) (Oral)   Ht 5' 5.5" (1.664 m)   Wt 286 lb 12 oz (130.1 kg)   SpO2 93%   BMI 46.99 kg/m    CC: HA Subjective:    Patient ID: Colton Porter, male    DOB: 07-11-1965, 52 y.o.   MRN: [id number]  HPI: Colton Porter is a 52 y.o. male presenting on 03/02/2018 for Headache (Here for f/u. Pt states HA is better since starting doxycycline. Has not had to take any Tylenol today. Says right ear is feeling stopped up and painful but not as much today. Told by ENT he may have to have tubes placed. )   See prior note and phone notes for details.  He had see Dr Glori Bickers with R sided headache, ESR was mildly elevated. Thought migraine related - no improvement with maxalt.  Treated for presumed sinusitis with 2 wk augmentin course started 02/21/2018 - no improvement after 7d treatment so this was changed to doxycycline antibiotic on Wednesday 02/28/2018 - he is actually starting to feel better.   Ongoing R facial pain - entire face with pain described as constant pounding ache and pressure. Throbbing pain of eye. No vision changes. Mild tinnitus - chronic. Intermittent congestion worse at night. No fevers/chills, no productive mucous. No ST, PNdrainage. Mild cough at night.   Follows with ENT chronic serous otitis - told may need ear tubes.   Relevant past medical, surgical, family and social history reviewed and updated as indicated. Interim medical history since our last visit reviewed. Allergies and medications reviewed and updated. Outpatient Medications Prior to Visit  Medication Sig Dispense Refill  . acetaminophen (TYLENOL) 500 MG tablet Take 500 mg by mouth every 6 (six) hours as needed for mild pain.    Marland Kitchen allopurinol (ZYLOPRIM) 100 MG tablet Take 2 tablets (200 mg total) by mouth daily. 180 tablet 3  . amLODipine (NORVASC) 10 MG tablet Take 1 tablet (10 mg total) by mouth daily.  90 tablet 3  . ASPIR-LOW 81 MG EC tablet Take 1 tablet (81 mg total) by mouth daily. 90 tablet 3  . Blood Glucose Monitoring Suppl (ACCU-CHEK GUIDE) w/Device KIT 1 Units by Does not apply route as directed. 1 kit 0  . Cholecalciferol (VITAMIN D3) 2000 units TABS Take 1 tablet by mouth at bedtime.    . docusate sodium (COLACE) 100 MG capsule Take 100 mg by mouth 2 (two) times daily as needed for mild constipation.    Marland Kitchen doxazosin (CARDURA) 8 MG tablet Take 1 tablet (8 mg total) by mouth at bedtime. 90 tablet 3  . doxycycline (VIBRA-TABS) 100 MG tablet Take 1 tablet (100 mg total) by mouth 2 (two) times daily. 20 tablet 0  . escitalopram (LEXAPRO) 10 MG tablet Take 1 tablet (10 mg total) by mouth daily. 90 tablet 3  . furosemide (LASIX) 40 MG tablet Take 1 tablet (40 mg total) by mouth daily. 90 tablet 3  . glimepiride (AMARYL) 1 MG tablet TAKE 1 TABLET (1 MG TOTAL) BY MOUTH DAILY WITH BREAKFAST. 90 tablet 1  . glucose blood (ACCU-CHEK GUIDE) test strip Check sugars three times daily and as needed when feeling ill E11.65, insulin use 100 each 11  . Insulin Glargine (LANTUS SOLOSTAR) 100 UNIT/ML Solostar Pen Inject 15 Units into the skin daily at 10 pm. 2 pen 3  . Insulin Pen Needle (  B-D UF III MINI PEN NEEDLES) 31G X 5 MM MISC Use to inject insulin daily 100 each 3  . LANCETS ULTRA THIN 30G MISC Check sugars three times daily and as needed when feeling ill E11.65, insulin use 100 each 11  . loratadine (CLARITIN) 10 MG tablet Take 10 mg by mouth daily.    Marland Kitchen losartan (COZAAR) 50 MG tablet Take 1 tablet (50 mg total) by mouth daily. 90 tablet 3  . Multiple Vitamin (MULTIVITAMIN WITH MINERALS) TABS tablet Take 2 tablets by mouth every evening.    . Omega-3 Fatty Acids (FISH OIL PO) Take 1 tablet by mouth at bedtime.    Marland Kitchen omeprazole (PRILOSEC) 40 MG capsule Take 1 capsule (40 mg total) by mouth daily. 90 capsule 3  . oxyCODONE (ROXICODONE) 5 MG immediate release tablet Take 1-2 tablets (5-10 mg total) by  mouth every 4 (four) hours as needed for severe pain. 30 tablet 0  . rizatriptan (MAXALT) 10 MG tablet Take 1 tablet (10 mg total) by mouth as needed for migraine. May repeat in 2 hours if needed 10 tablet 0  . tacrolimus (PROGRAF) 1 MG capsule Take 1-2 mg by mouth 2 (two) times daily. 2 mg in the morning and 1 mg in the evening    . tamsulosin (FLOMAX) 0.4 MG CAPS capsule TAKE 2 CAPSULES (0.8 MG TOTAL) BY MOUTH DAILY. 180 capsule 3  . VOLTAREN 1 % GEL Apply 2 g topically 3 (three) times daily. 100 g 1  . amoxicillin-clavulanate (AUGMENTIN) 875-125 MG tablet Take 1 tablet by mouth 2 (two) times daily for 14 days. 28 tablet 0   No facility-administered medications prior to visit.      Per HPI unless specifically indicated in ROS section below Review of Systems     Objective:    BP 122/74 (BP Location: Left Arm, Patient Position: Sitting, Cuff Size: Large)   Pulse 88   Temp 98.7 F (37.1 C) (Oral)   Ht 5' 5.5" (1.664 m)   Wt 286 lb 12 oz (130.1 kg)   SpO2 93%   BMI 46.99 kg/m   Wt Readings from Last 3 Encounters:  03/02/18 286 lb 12 oz (130.1 kg)  02/20/18 286 lb (129.7 kg)  02/05/18 283 lb 8 oz (128.6 kg)    Physical Exam  Constitutional: He appears well-developed and well-nourished. No distress.  HENT:  Head: Normocephalic and atraumatic.  Right Ear: Hearing, tympanic membrane, external ear and ear canal normal.  Left Ear: Hearing, tympanic membrane, external ear and ear canal normal.  Nose: Nose normal. No mucosal edema or rhinorrhea. Right sinus exhibits no maxillary sinus tenderness and no frontal sinus tenderness. Left sinus exhibits no maxillary sinus tenderness and no frontal sinus tenderness.  Mouth/Throat: Uvula is midline, oropharynx is clear and moist and mucous membranes are normal. No oropharyngeal exudate, posterior oropharyngeal edema, posterior oropharyngeal erythema or tonsillar abscesses.  No tenderness or nodularity of temporal arteries bilaterally No  reproducible pain FROM at TMJs Nasal mucosal congestion L nare. Mild discomfort to palpation of R lower molar Poor dentition  Eyes: Pupils are equal, round, and reactive to light. Conjunctivae and EOM are normal. No scleral icterus.  Neck: Normal range of motion. Neck supple.  Lymphadenopathy:    He has no cervical adenopathy.  Skin: Skin is warm and dry. No rash noted.  Nursing note and vitals reviewed.  Results for orders placed or performed in visit on 02/20/18  Sedimentation Rate  Result Value Ref Range   Sed  Rate 53 (H) 0 - 20 mm/hr  C-reactive protein  Result Value Ref Range   CRP 2.5 0.5 - 20.0 mg/dL   Lab Results  Component Value Date   CREATININE 1.46 10/06/2017   BUN 21 10/06/2017   NA 141 10/06/2017   K 4.5 10/06/2017   CL 106 10/06/2017   CO2 28 10/06/2017    Lab Results  Component Value Date   HGBA1C 6.4 (A) 02/05/2018       Assessment & Plan:   Problem List Items Addressed This Visit    Acute sinusitis - Primary    Anticipate recurrent sinusitis presenting as R facial pain, not responding to augmentin, doing better on doxycycline. rec finish course. Suggested return to ENT and dentist given known dental disease for further evaluation if persistent pain after treatment. Pt agrees with plan.  Not consistent with [facility].           No orders of the defined types were placed in this encounter.  No orders of the defined types were placed in this encounter.   Follow up plan: No follow-ups on file.  Ria Bush, MD

## 2018-03-02 NOTE — Patient Instructions (Addendum)
I'm glad doxycycline seems to be working. If no improvement, next step may be return to see ENT and dentist.  Possible sinus infection.

## 2018-03-02 NOTE — Assessment & Plan Note (Addendum)
Anticipate recurrent sinusitis presenting as R facial pain, not responding to augmentin, doing better on doxycycline. rec finish course. Suggested return to ENT and dentist given known dental disease for further evaluation if persistent pain after treatment. Pt agrees with plan.  Not consistent with [facility].

## 2018-03-07 ENCOUNTER — Other Ambulatory Visit: Payer: Self-pay | Admitting: Family Medicine

## 2018-03-21 ENCOUNTER — Other Ambulatory Visit: Payer: Self-pay | Admitting: Family Medicine

## 2018-04-05 DIAGNOSIS — Z4823 Encounter for aftercare following liver transplant: Secondary | ICD-10-CM | POA: Diagnosis not present

## 2018-04-05 DIAGNOSIS — Z944 Liver transplant status: Secondary | ICD-10-CM | POA: Diagnosis not present

## 2018-04-11 ENCOUNTER — Other Ambulatory Visit: Payer: Self-pay | Admitting: Family Medicine

## 2018-04-23 ENCOUNTER — Encounter: Payer: Self-pay | Admitting: Family Medicine

## 2018-04-23 ENCOUNTER — Ambulatory Visit (INDEPENDENT_AMBULATORY_CARE_PROVIDER_SITE_OTHER): Payer: Medicare HMO | Admitting: Family Medicine

## 2018-04-23 VITALS — BP 128/78 | HR 92 | Temp 98.5°F | Ht 65.5 in | Wt 287.5 lb

## 2018-04-23 DIAGNOSIS — M79672 Pain in left foot: Secondary | ICD-10-CM | POA: Diagnosis not present

## 2018-04-23 DIAGNOSIS — J0110 Acute frontal sinusitis, unspecified: Secondary | ICD-10-CM | POA: Diagnosis not present

## 2018-04-23 DIAGNOSIS — H6192 Disorder of left external ear, unspecified: Secondary | ICD-10-CM

## 2018-04-23 DIAGNOSIS — G8929 Other chronic pain: Secondary | ICD-10-CM | POA: Diagnosis not present

## 2018-04-23 DIAGNOSIS — Z23 Encounter for immunization: Secondary | ICD-10-CM | POA: Diagnosis not present

## 2018-04-23 MED ORDER — DOXYCYCLINE HYCLATE 100 MG PO TABS: 100.0000 mg | ORAL_TABLET | Freq: Two times a day (BID) | ORAL | 0 refills | Status: DC

## 2018-04-23 MED ORDER — INSULIN GLARGINE 100 UNIT/ML SOLOSTAR PEN: 15.0000 [IU] | PEN_INJECTOR | Freq: Every day | SUBCUTANEOUS | 3 refills | Status: DC

## 2018-04-23 NOTE — Assessment & Plan Note (Signed)
Known heel spur on xray 03/2017. rec heel gel inserts. If not improving, he will let me know for podiatry referral.

## 2018-04-23 NOTE — Progress Notes (Signed)
BP 128/78 (BP Location: Left Arm, Patient Position: Sitting, Cuff Size: Large)   Pulse 92   Temp 98.5 F (36.9 C) (Oral)   Ht 5' 5.5" (1.664 m)   Wt 287 lb 8 oz (130.4 kg)   SpO2 95%   BMI 47.11 kg/m    CC: sinusitis? Subjective:    Patient ID: Colton Porter, male    DOB: [birth year], 52 y.o.   MRN: [id number]  HPI: Colton Porter is a 52 y.o. male presenting on 04/23/2018 for Sinus Problem (C/o sinus pressure, bilateral ear pressure and teeth pain. Hurts to wear CPAP due to facial pain. Tried Tylenol Sinus, not helpful. )   1+ wk h/o L facial pain pressure, pain, earache, headache, ST. Trouble sleeping 2/2 sinus pressure. Trouble tolerating CPAP mask due to congestion/pressure pain. Blowing nose with clear thick phlegm.   No fevers/chills, cough.  Treating with tylenol and tylenol sinus headache. He also started flonase nasal spray.  Grand children sick recently.   Last sinusitis treated with 2wk augmentin course 02/2018, changed to doxycycline after initially failing to improve.   Follows with ENT chronic serous otitis - told may need ear tubes.   Poorly healing lesion L pinna.   Ongoing R heel pain for months. Thought plantar fasciitis vs heel spur - no improvement despite foot exercises. xrays 03/2017 - inferior calcaneal spur. Known gout managed with allopurinol daily.   Relevant past medical, surgical, family and social history reviewed and updated as indicated. Interim medical history since our last visit reviewed. Allergies and medications reviewed and updated. Outpatient Medications Prior to Visit  Medication Sig Dispense Refill  . acetaminophen (TYLENOL) 500 MG tablet Take 500 mg by mouth every 6 (six) hours as needed for mild pain.    Marland Kitchen allopurinol (ZYLOPRIM) 100 MG tablet Take 2 tablets (200 mg total) by mouth daily. 180 tablet 3  . amLODipine (NORVASC) 10 MG tablet Take 1 tablet (10 mg total) by mouth daily. 90 tablet 3  . ASPIR-LOW 81 MG EC tablet Take 1  tablet (81 mg total) by mouth daily. 90 tablet 3  . Blood Glucose Monitoring Suppl (ACCU-CHEK GUIDE) w/Device KIT 1 Units by Does not apply route as directed. 1 kit 0  . Cholecalciferol (VITAMIN D3) 2000 units TABS Take 1 tablet by mouth at bedtime.    . docusate sodium (COLACE) 100 MG capsule Take 100 mg by mouth 2 (two) times daily as needed for mild constipation.    Marland Kitchen doxazosin (CARDURA) 8 MG tablet TAKE 1 TABLET (8 MG TOTAL) BY MOUTH AT BEDTIME. 90 tablet 2  . escitalopram (LEXAPRO) 10 MG tablet TAKE 1 TABLET (10 MG TOTAL) BY MOUTH DAILY. 90 tablet 1  . furosemide (LASIX) 40 MG tablet Take 1 tablet (40 mg total) by mouth daily. 90 tablet 3  . glimepiride (AMARYL) 1 MG tablet TAKE 1 TABLET (1 MG TOTAL) BY MOUTH DAILY WITH BREAKFAST. 90 tablet 1  . glucose blood (ACCU-CHEK GUIDE) test strip Check sugars three times daily and as needed when feeling ill E11.65, insulin use 100 each 11  . Insulin Pen Needle (B-D UF III MINI PEN NEEDLES) 31G X 5 MM MISC Use to inject insulin daily 100 each 3  . LANCETS ULTRA THIN 30G MISC Check sugars three times daily and as needed when feeling ill E11.65, insulin use 100 each 11  . loratadine (CLARITIN) 10 MG tablet Take 10 mg by mouth daily.    Marland Kitchen losartan (COZAAR) 50 MG tablet TAKE  1 TABLET EVERY DAY 90 tablet 1  . Multiple Vitamin (MULTIVITAMIN WITH MINERALS) TABS tablet Take 2 tablets by mouth every evening.    . Omega-3 Fatty Acids (FISH OIL PO) Take 1 tablet by mouth at bedtime.    Marland Kitchen omeprazole (PRILOSEC) 40 MG capsule Take 1 capsule (40 mg total) by mouth daily. 90 capsule 3  . oxyCODONE (ROXICODONE) 5 MG immediate release tablet Take 1-2 tablets (5-10 mg total) by mouth every 4 (four) hours as needed for severe pain. 30 tablet 0  . rizatriptan (MAXALT) 10 MG tablet Take 1 tablet (10 mg total) by mouth as needed for migraine. May repeat in 2 hours if needed 10 tablet 0  . tacrolimus (PROGRAF) 1 MG capsule Take 1-2 mg by mouth 2 (two) times daily. 2 mg in the  morning and 1 mg in the evening    . tamsulosin (FLOMAX) 0.4 MG CAPS capsule TAKE 2 CAPSULES (0.8 MG TOTAL) BY MOUTH DAILY. 180 capsule 3  . VOLTAREN 1 % GEL Apply 2 g topically 3 (three) times daily. 100 g 1  . doxycycline (VIBRA-TABS) 100 MG tablet Take 1 tablet (100 mg total) by mouth 2 (two) times daily. 20 tablet 0  . Insulin Glargine (LANTUS SOLOSTAR) 100 UNIT/ML Solostar Pen Inject 15 Units into the skin daily at 10 pm. 2 pen 3   No facility-administered medications prior to visit.      Per HPI unless specifically indicated in ROS section below Review of Systems     Objective:    BP 128/78 (BP Location: Left Arm, Patient Position: Sitting, Cuff Size: Large)   Pulse 92   Temp 98.5 F (36.9 C) (Oral)   Ht 5' 5.5" (1.664 m)   Wt 287 lb 8 oz (130.4 kg)   SpO2 95%   BMI 47.11 kg/m   Wt Readings from Last 3 Encounters:  04/23/18 287 lb 8 oz (130.4 kg)  03/02/18 286 lb 12 oz (130.1 kg)  02/20/18 286 lb (129.7 kg)    Physical Exam  Constitutional: He appears well-developed and well-nourished. No distress.  HENT:  Head: Normocephalic and atraumatic.  Right Ear: Hearing, tympanic membrane, external ear and ear canal normal.  Left Ear: Hearing, tympanic membrane and ear canal normal.  Nose: No mucosal edema or rhinorrhea. Right sinus exhibits no maxillary sinus tenderness and no frontal sinus tenderness. Left sinus exhibits frontal sinus tenderness. Left sinus exhibits no maxillary sinus tenderness.  Mouth/Throat: Uvula is midline, oropharynx is clear and moist and mucous membranes are normal. No oropharyngeal exudate, posterior oropharyngeal edema, posterior oropharyngeal erythema or tonsillar abscesses.  Poorly healing growth at L external ear Nasal mucous deep nare on left  Eyes: Pupils are equal, round, and reactive to light. Conjunctivae and EOM are normal. No scleral icterus.  Neck: Normal range of motion. Neck supple.  Cardiovascular: Normal rate, regular rhythm, normal  heart sounds and intact distal pulses.  No murmur heard. Pulmonary/Chest: Effort normal and breath sounds normal. No respiratory distress. He has no wheezes. He has no rales.  Lymphadenopathy:    He has no cervical adenopathy.  Skin: Skin is warm and dry. No rash noted.  Nursing note and vitals reviewed.  DG Foot Complete Right CLINICAL DATA:  Pain for 2 days  EXAM: RIGHT FOOT COMPLETE - 3+ VIEW  COMPARISON:  None.  FINDINGS: Frontal, oblique, and lateral views were obtained. There is mild generalized soft tissue swelling. No fracture or dislocation. There is narrowing of all PIP and DIP joints.  No erosive change. There is a spur arising from the inferior calcaneus.  IMPRESSION: Soft tissue swelling. No fracture or dislocation. Narrowing multiple distal joints. Inferior calcaneal spur.  Electronically Signed   By: Lowella Grip III M.D.   On: 04/12/2017 11:46      Assessment & Plan:   Problem List Items Addressed This Visit    Earlobe lesion, left    Ongoing over 6+ months.  Will refer to dermatology for further evaluation r/o BCC.       Relevant Orders   Ambulatory referral to Dermatology   Chronic heel pain, left    Known heel spur on xray 03/2017. rec heel gel inserts. If not improving, he will let me know for podiatry referral.       Acute sinusitis - Primary    Return of sinusitis after treatment 8 and 02/2018. Symptoms did resolve throughout September and October. Previously augmentin ineffective, doxycycline did resolve infection - will treat with another 10d doxy course. Update if not improving - would recommend return to ENT for further evaluation.       Relevant Medications   doxycycline (VIBRA-TABS) 100 MG tablet    Other Visit Diagnoses    Need for influenza vaccination       Relevant Orders   Flu Vaccine QUAD 36+ mos IM (Completed)       Meds ordered this encounter  Medications  . Insulin Glargine (LANTUS SOLOSTAR) 100 UNIT/ML Solostar Pen      Sig: Inject 15 Units into the skin daily at 10 pm. QS 3 mo    Dispense:  5 pen    Refill:  3  . doxycycline (VIBRA-TABS) 100 MG tablet    Sig: Take 1 tablet (100 mg total) by mouth 2 (two) times daily.    Dispense:  20 tablet    Refill:  0   Orders Placed This Encounter  Procedures  . Flu Vaccine QUAD 36+ mos IM  . Ambulatory referral to Dermatology    Referral Priority:   Routine    Referral Type:   Consultation    Referral Reason:   Specialty Services Required    Requested Specialty:   Dermatology    Number of Visits Requested:   1    Follow up plan: Return if symptoms worsen or fail to improve.  Ria Bush, MD

## 2018-04-23 NOTE — Assessment & Plan Note (Addendum)
Ongoing over 6+ months.  Will refer to dermatology for further evaluation r/o BCC.

## 2018-04-23 NOTE — Patient Instructions (Addendum)
We will refer you to dermatology for left ear growth.  You have return of sinus infection - treat with doxycycline 10 day course  Stay well hydrated. May use plain mucinex with water to mobilize mucous.  Use heel lifts for shoes.  Sinusitis, Adult Sinusitis is soreness and inflammation of your sinuses. Sinuses are hollow spaces in the bones around your face. They are located:  Around your eyes.  In the middle of your forehead.  Behind your nose.  In your cheekbones.  Your sinuses and nasal passages are lined with a stringy fluid (mucus). Mucus normally drains out of your sinuses. When your nasal tissues get inflamed or swollen, the mucus can get trapped or blocked so air cannot flow through your sinuses. This lets bacteria, viruses, and funguses grow, and that leads to infection. Follow these instructions at home: Medicines  Take, use, or apply over-the-counter and prescription medicines only as told by your doctor. These may include nasal sprays.  If you were prescribed an antibiotic medicine, take it as told by your doctor. Do not stop taking the antibiotic even if you start to feel better. Hydrate and Humidify  Drink enough water to keep your pee (urine) clear or pale yellow.  Use a cool mist humidifier to keep the humidity level in your home above 50%.  Breathe in steam for 10-15 minutes, 3-4 times a day or as told by your doctor. You can do this in the bathroom while a hot shower is running.  Try not to spend time in cool or dry air. Rest  Rest as much as possible.  Sleep with your head raised (elevated).  Make sure to get enough sleep each night. General instructions  Put a warm, moist washcloth on your face 3-4 times a day or as told by your doctor. This will help with discomfort.  Wash your hands often with soap and water. If there is no soap and water, use hand sanitizer.  Do not smoke. Avoid being around people who are smoking (secondhand smoke).  Keep all  follow-up visits as told by your doctor. This is important. Contact a doctor if:  You have a fever.  Your symptoms get worse.  Your symptoms do not get better within 10 days. Get help right away if:  You have a very bad headache.  You cannot stop throwing up (vomiting).  You have pain or swelling around your face or eyes.  You have trouble seeing.  You feel confused.  Your neck is stiff.  You have trouble breathing. This information is not intended to replace advice given to you by your health care provider. Make sure you discuss any questions you have with your health care provider. Document Released: 11/30/2007 Document Revised: 02/07/2016 Document Reviewed: 04/08/2015 Elsevier Interactive Patient Education  [employer].

## 2018-04-23 NOTE — Assessment & Plan Note (Addendum)
Return of sinusitis after treatment 8 and 02/2018. Symptoms did resolve throughout September and October. Previously augmentin ineffective, doxycycline did resolve infection - will treat with another 10d doxy course. Update if not improving - would recommend return to ENT for further evaluation.

## 2018-05-01 ENCOUNTER — Telehealth: Payer: Self-pay | Admitting: *Deleted

## 2018-05-01 ENCOUNTER — Other Ambulatory Visit: Payer: Self-pay | Admitting: Otolaryngology

## 2018-05-01 ENCOUNTER — Other Ambulatory Visit: Payer: Self-pay | Admitting: Family Medicine

## 2018-05-01 ENCOUNTER — Ambulatory Visit
Admission: RE | Admit: 2018-05-01 | Discharge: 2018-05-01 | Disposition: A | Discharge status: Home / Self Care | Payer: Medicare HMO | Source: Ambulatory Visit | Attending: Otolaryngology | Admitting: Otolaryngology

## 2018-05-01 DIAGNOSIS — J322 Chronic ethmoidal sinusitis: Secondary | ICD-10-CM

## 2018-05-01 DIAGNOSIS — J320 Chronic maxillary sinusitis: Secondary | ICD-10-CM

## 2018-05-01 DIAGNOSIS — J0111 Acute recurrent frontal sinusitis: Secondary | ICD-10-CM

## 2018-05-01 DIAGNOSIS — J323 Chronic sphenoidal sinusitis: Secondary | ICD-10-CM | POA: Diagnosis not present

## 2018-05-01 NOTE — Telephone Encounter (Signed)
Spoke to pt who states that he has not improved since his 10/28 OV. He has one day remaining on his abx, but feels as though he is getting worse. Advised pt that medication stays in his system days after completion, but he is wanting to know if there is anything additional he can do, in the meantime, to feel better. pls advise

## 2018-05-01 NOTE — Telephone Encounter (Signed)
Appt made and patient notified 

## 2018-05-01 NOTE — Telephone Encounter (Signed)
Recommend we get him back in with ENT for poorly resolving sinus infection - referral placed.

## 2018-05-02 NOTE — Telephone Encounter (Signed)
Pt last seen 02/05/18 for 3 - 4 mth FU; next appt 10/18/18 for annual. Refilled omeprazole 40 mg # 90 x [place] per protocol.

## 2018-05-07 ENCOUNTER — Other Ambulatory Visit: Payer: Self-pay | Admitting: Family Medicine

## 2018-05-08 DIAGNOSIS — J320 Chronic maxillary sinusitis: Secondary | ICD-10-CM | POA: Diagnosis not present

## 2018-05-08 DIAGNOSIS — C44219 Basal cell carcinoma of skin of left ear and external auricular canal: Secondary | ICD-10-CM | POA: Diagnosis not present

## 2018-06-05 DIAGNOSIS — Z944 Liver transplant status: Secondary | ICD-10-CM | POA: Diagnosis not present

## 2018-06-05 DIAGNOSIS — Z4823 Encounter for aftercare following liver transplant: Secondary | ICD-10-CM | POA: Diagnosis not present

## 2018-06-08 ENCOUNTER — Other Ambulatory Visit: Payer: Self-pay | Admitting: Family Medicine

## 2018-06-08 ENCOUNTER — Telehealth: Payer: Self-pay

## 2018-06-08 DIAGNOSIS — E785 Hyperlipidemia, unspecified: Secondary | ICD-10-CM

## 2018-06-08 NOTE — Telephone Encounter (Signed)
Please address statin therapy with patient per [facility]/[employer] gap due to DM. Thank you

## 2018-06-14 ENCOUNTER — Other Ambulatory Visit: Payer: Self-pay | Admitting: Family Medicine

## 2018-06-16 NOTE — Telephone Encounter (Addendum)
Don't recommend he be on statin at this time due to liver transplant history.  Do you know what I have to do to document this?

## 2018-06-18 NOTE — Telephone Encounter (Signed)
I will check with [facility] to see if anything can be dome to remove the gap or not. Thank you

## 2018-06-22 ENCOUNTER — Telehealth: Payer: Self-pay

## 2018-06-22 NOTE — Telephone Encounter (Signed)
Pt said N&V&diarrhea started on 06/20/18 in AM; last vomited 06/20/18 at night. Now pt has watery diarrhea; last diarrhea was one hr ago. Pt has had diarrhea for 3 days. No fever. Some abd soreness but pt thinks that is from vomiting. Pt can keep liquids down. Pt took 1/2 bottle of pepto bismol on 06/21/18. Pt said he feels OK today except for frequent diarrhea stools and does not want to schedule appt. Pt wants to know what he can take to get rid of diarrhea. [facility].

## 2018-06-22 NOTE — Telephone Encounter (Signed)
Spoke with Ginger with [facility] and liver transplant will not close the gap but it is ok and just have to do what is right for the patient. Thank you. Nothing to document different at this time.

## 2018-06-22 NOTE — Telephone Encounter (Signed)
Spoke with pt asking about blood in the stool or fever. Denies either. Says he feels a little better today. I relayed Dr. Synthia Innocent instructions and message. Pt verbalizes understanding. Fyi to Dr. Darnell Level.

## 2018-06-22 NOTE — Telephone Encounter (Signed)
Ensure no blood in stool or fever. If not, could try imodium OTC 1-2 tabs/day.  Let us know if no better into next week, seek urgent care over weekend if worsening in interim.

## 2018-06-25 ENCOUNTER — Other Ambulatory Visit: Payer: Self-pay | Admitting: Family Medicine

## 2018-07-03 DIAGNOSIS — L821 Other seborrheic keratosis: Secondary | ICD-10-CM | POA: Diagnosis not present

## 2018-07-03 DIAGNOSIS — Z08 Encounter for follow-up examination after completed treatment for malignant neoplasm: Secondary | ICD-10-CM | POA: Diagnosis not present

## 2018-07-03 DIAGNOSIS — Z85828 Personal history of other malignant neoplasm of skin: Secondary | ICD-10-CM | POA: Diagnosis not present

## 2018-07-28 ENCOUNTER — Other Ambulatory Visit: Payer: Self-pay | Admitting: Family Medicine

## 2018-08-06 DIAGNOSIS — Z944 Liver transplant status: Secondary | ICD-10-CM | POA: Diagnosis not present

## 2018-08-13 DIAGNOSIS — G4733 Obstructive sleep apnea (adult) (pediatric): Secondary | ICD-10-CM | POA: Diagnosis not present

## 2018-08-17 ENCOUNTER — Telehealth: Payer: Self-pay

## 2018-08-17 NOTE — Telephone Encounter (Signed)
Submitted PA for doxazosin (Cardura) 8 mg tab, key:  [id number], Rx #:  [id number]. Received message stating, available without authorization.

## 2018-09-03 DIAGNOSIS — N183 Chronic kidney disease, stage 3 (moderate): Secondary | ICD-10-CM | POA: Diagnosis not present

## 2018-09-03 DIAGNOSIS — N189 Chronic kidney disease, unspecified: Secondary | ICD-10-CM | POA: Diagnosis not present

## 2018-09-03 DIAGNOSIS — N2581 Secondary hyperparathyroidism of renal origin: Secondary | ICD-10-CM | POA: Diagnosis not present

## 2018-09-12 DIAGNOSIS — N183 Chronic kidney disease, stage 3 (moderate): Secondary | ICD-10-CM | POA: Diagnosis not present

## 2018-09-12 DIAGNOSIS — M109 Gout, unspecified: Secondary | ICD-10-CM | POA: Diagnosis not present

## 2018-09-12 DIAGNOSIS — I129 Hypertensive chronic kidney disease with stage 1 through stage 4 chronic kidney disease, or unspecified chronic kidney disease: Secondary | ICD-10-CM | POA: Diagnosis not present

## 2018-09-12 DIAGNOSIS — D631 Anemia in chronic kidney disease: Secondary | ICD-10-CM | POA: Diagnosis not present

## 2018-09-12 DIAGNOSIS — N2581 Secondary hyperparathyroidism of renal origin: Secondary | ICD-10-CM | POA: Diagnosis not present

## 2018-09-14 NOTE — Telephone Encounter (Signed)
Insurance is questioning cardura/flomax combo. Would call patient - probably need to try off flomax as cardura should help prostate symptoms (he's been taking one in am and one in pm). If worsening prostate symptoms, to let us know to restart flomax.

## 2018-09-14 NOTE — Telephone Encounter (Signed)
Spoke with pt relaying Dr. G's message.  Pt verbalizes understanding and will let Dr. G know how it goes.  

## 2018-09-22 ENCOUNTER — Other Ambulatory Visit: Payer: Self-pay | Admitting: Family Medicine

## 2018-10-01 ENCOUNTER — Other Ambulatory Visit: Payer: Self-pay | Admitting: Family Medicine

## 2018-10-05 DIAGNOSIS — Z944 Liver transplant status: Secondary | ICD-10-CM | POA: Diagnosis not present

## 2018-10-09 ENCOUNTER — Telehealth: Payer: Self-pay | Admitting: Family Medicine

## 2018-10-09 MED ORDER — DOXAZOSIN MESYLATE 8 MG PO TABS: 8.0000 mg | ORAL_TABLET | Freq: Every day | ORAL | 0 refills | Status: DC

## 2018-10-09 NOTE — Telephone Encounter (Signed)
Best number [phone] Pt called stating he is having problems getting his doxazosin refilled  [place]  Please advise when called in  Pt has enough for this week and next

## 2018-10-09 NOTE — Telephone Encounter (Signed)
E-scribed refill to [facility] with pt notifying him refill was sent.  Expresses his thanks.

## 2018-10-15 ENCOUNTER — Other Ambulatory Visit (INDEPENDENT_AMBULATORY_CARE_PROVIDER_SITE_OTHER): Payer: Medicare HMO

## 2018-10-15 ENCOUNTER — Other Ambulatory Visit: Payer: Self-pay | Admitting: Family Medicine

## 2018-10-15 ENCOUNTER — Ambulatory Visit (INDEPENDENT_AMBULATORY_CARE_PROVIDER_SITE_OTHER): Payer: Medicare HMO

## 2018-10-15 ENCOUNTER — Other Ambulatory Visit: Payer: Self-pay

## 2018-10-15 DIAGNOSIS — E1169 Type 2 diabetes mellitus with other specified complication: Secondary | ICD-10-CM

## 2018-10-15 DIAGNOSIS — N183 Chronic kidney disease, stage 3 unspecified: Secondary | ICD-10-CM

## 2018-10-15 DIAGNOSIS — E785 Hyperlipidemia, unspecified: Secondary | ICD-10-CM

## 2018-10-15 DIAGNOSIS — Z794 Long term (current) use of insulin: Principal | ICD-10-CM

## 2018-10-15 DIAGNOSIS — M1A0790 Idiopathic chronic gout, unspecified ankle and foot, without tophus (tophi): Secondary | ICD-10-CM

## 2018-10-15 DIAGNOSIS — Z125 Encounter for screening for malignant neoplasm of prostate: Secondary | ICD-10-CM

## 2018-10-15 DIAGNOSIS — D696 Thrombocytopenia, unspecified: Secondary | ICD-10-CM | POA: Diagnosis not present

## 2018-10-15 DIAGNOSIS — K7030 Alcoholic cirrhosis of liver without ascites: Secondary | ICD-10-CM

## 2018-10-15 DIAGNOSIS — Z0000 Encounter for general adult medical examination without abnormal findings: Secondary | ICD-10-CM | POA: Diagnosis not present

## 2018-10-15 LAB — HEMOGLOBIN A1C: Hgb A1c MFr Bld: 7.8 % — ABNORMAL HIGH (ref 4.6–6.5)

## 2018-10-15 LAB — COMPREHENSIVE METABOLIC PANEL
ALT: 25 U/L (ref 0–53)
AST: 16 U/L (ref 0–37)
Albumin: 4.1 g/dL (ref 3.5–5.2)
Alkaline Phosphatase: 92 U/L (ref 39–117)
BUN: 17 mg/dL (ref 6–23)
CO2: 28 mEq/L (ref 19–32)
Calcium: 8.8 mg/dL (ref 8.4–10.5)
Chloride: 102 mEq/L (ref 96–112)
Creatinine, Ser: 1.21 mg/dL (ref 0.40–1.50)
GFR: 62.95 mL/min (ref >60.00)
Glucose, Bld: 184 mg/dL — ABNORMAL HIGH (ref 70–99)
Potassium: 4.3 mEq/L (ref 3.5–5.1)
Sodium: 138 mEq/L (ref 135–145)
Total Bilirubin: 0.6 mg/dL (ref 0.2–1.2)
Total Protein: 6.9 g/dL (ref 6.0–8.3)

## 2018-10-15 LAB — PSA, MEDICARE: PSA: 3.99 ng/ml (ref 0.10–4.00)

## 2018-10-15 LAB — CBC WITH DIFFERENTIAL/PLATELET
Basophils Absolute: 0 10*3/uL (ref 0.0–0.1)
Basophils Relative: 0.4 % (ref 0.0–3.0)
Eosinophils Absolute: 0.1 10*3/uL (ref 0.0–0.7)
Eosinophils Relative: 2.1 % (ref 0.0–5.0)
HCT: 36.3 % — ABNORMAL LOW (ref 39.0–52.0)
Hemoglobin: 12.2 g/dL — ABNORMAL LOW (ref 13.0–17.0)
Lymphocytes Relative: 19.2 % (ref 12.0–46.0)
Lymphs Abs: 1.2 10*3/uL (ref 0.7–4.0)
MCHC: 33.5 g/dL (ref 30.0–36.0)
MCV: 84.4 fl (ref 78.0–100.0)
Monocytes Absolute: 0.3 10*3/uL (ref 0.1–1.0)
Monocytes Relative: 4.7 % (ref 3.0–12.0)
Neutro Abs: 4.5 10*3/uL (ref 1.4–7.7)
Neutrophils Relative %: 73.6 % (ref 43.0–77.0)
Platelets: 131 10*3/uL — ABNORMAL LOW (ref 150.0–400.0)
RBC: 4.3 Mil/uL (ref 4.22–5.81)
RDW: 16.4 % — ABNORMAL HIGH (ref 11.5–15.5)
WBC: 6.1 10*3/uL (ref 4.0–10.5)

## 2018-10-15 LAB — URIC ACID: Uric Acid, Serum: 7.5 mg/dL (ref 4.0–7.8)

## 2018-10-15 LAB — LIPID PANEL
Cholesterol: 92 mg/dL (ref 0–200)
HDL: 26.1 mg/dL — ABNORMAL LOW (ref >39.00)
NonHDL: 65.61
Total CHOL/HDL Ratio: 4
Triglycerides: 298 mg/dL — ABNORMAL HIGH (ref 0.0–149.0)
VLDL: 59.6 mg/dL — ABNORMAL HIGH (ref 0.0–40.0)

## 2018-10-15 LAB — LDL CHOLESTEROL, DIRECT: Direct LDL: 43 mg/dL

## 2018-10-15 LAB — MAGNESIUM: Magnesium: 1.5 mg/dL (ref 1.5–2.5)

## 2018-10-15 LAB — VITAMIN D 25 HYDROXY (VIT D DEFICIENCY, FRACTURES): VITD: 41.68 ng/mL (ref 30.00–100.00)

## 2018-10-15 NOTE — Progress Notes (Signed)
Subjective:   Colton Porter is a 53 y.o. male who presents for Medicare Annual/Subsequent preventive examination.  Review of Systems:  N/A Cardiac Risk Factors include: male gender;obesity (BMI >30kg/m2);diabetes mellitus     Objective:    Vitals: There were no vitals taken for this visit.  There is no height or weight on file to calculate BMI.  Advanced Directives 10/15/2018 10/06/2017 09/11/2017 11/20/2015 06/09/2015 12/31/2014 12/30/2014  Does Patient Have a Medical Advance Directive? _0  No No  Would patient like information on creating a medical advance directive? No - Patient declined Yes (MAU/Ambulatory/Procedural Areas - Information given) No - Patient declined No - patient declined information No - patient declined information No - patient declined information -  Pre-existing out of facility DNR order (yellow form or pink MOST form) - - - - - - -    Tobacco Social History   Tobacco Use  Smoking Status Never Smoker  Smokeless Tobacco Former Systems developer  . Types: Chew     Counseling given: No   Clinical Intake:  Pre-visit preparation completed: Yes  Pain : No/denies pain     Nutritional Status: BMI > 30  Obese Nutritional Risks: None Diabetes: Yes CBG done?: No Did pt. bring in CBG monitor from home?: No  How often do you need to have someone help you when you read instructions, pamphlets, or other written materials from your doctor or pharmacy?: 1 - Never What is the last grade level you completed in school?: 12th grade  Interpreter Needed?: No  Comments: pt lives with spouse Information entered by :: LPinson, LPN  Past Medical History:  Diagnosis Date  . Alcohol dependence ([facility]) 07/11/2012  . Alcoholic cirrhosis of liver with ascites ([facility]) 07/2014   s./p transplant 12/2015  . Allergy   . Anemia   . Cellulitis of left leg   . Chronic diastolic heart failure ([place]) 11/01/2013  . CKD (chronic kidney disease) stage 3, GFR 30-59 ml/min ([facility]) 11/04/2015  .  GERD (gastroesophageal reflux disease)   . Hyperlipidemia   . Hypertension   . Neuropathy   . OSA on CPAP 07/11/2012   HST 07/2013:  AHI 39/hr.    . Thrombocytopenia ([facility]) 12/15/2011   Past Surgical History:  Procedure Laterality Date  . COLONOSCOPY  08/2013   hyperplastic polyps, hemorrhoids Ardis Hughs)  . 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 FINGER AMPUTATION  1997   left 4th finger - radial arm saw  . LEFT AND RIGHT HEART CATHETERIZATION WITH CORONARY ANGIOGRAM N/A 06/10/2013   Procedure: LEFT AND RIGHT HEART CATHETERIZATION WITH CORONARY ANGIOGRAM;  Surgeon: Blane Ohara, MD;  Location: [facility] CATH LAB;  Service: Cardiovascular;  Laterality: N/A;  . LIVER TRANSPLANTATION  31/5176   alcoholic cirrhosis ([facility] at [facility])   Family History  Problem Relation Age of Onset  . Stroke Mother   . Emphysema Mother   . Hypertension Father   . Heart disease Father   . Emphysema Father   . Lung cancer Maternal Aunt   . Stroke Paternal Grandmother   . Heart attack Neg Hx    Social History   Socioeconomic History  . Marital status: Married    Spouse name: Not on file  . Number of children: 2  . Years of education: Not on file  . Highest education level: Not on file  Occupational History  . Occupation: Investment banker, corporate    Comment: no work lately  Social Needs  .  Financial resource strain: Not on file  . Food insecurity:    Worry: Not on file    Inability: Not on file  . Transportation needs:    Medical: Not on file    Non-medical: Not on file  Tobacco Use  . Smoking status: Never Smoker  . Smokeless tobacco: Former Systems developer    Types: Chew  Substance and Sexual Activity  . Alcohol use: No    Alcohol/week: 0.0 standard drinks    Comment: 4-6 drinks daily - NO ETOH SINCE APRIL 2016  . Drug use: Yes    Comment: remote use of marijuana in the past, has since quit  . Sexual activity: Never  Lifestyle  . Physical activity:    Days  per week: Not on file    Minutes per session: Not on file  . Stress: Not on file  Relationships  . Social connections:    Talks on phone: Not on file    Gets together: Not on file    Attends religious service: Not on file    Active member of club or organization: Not on file    Attends meetings of clubs or organizations: Not on file    Relationship status: Not on file  Other Topics Concern  . Not on file  Social History Narrative    Lives with wife   Occupation: full disability after cirrhosis dx, some working    Activity: some yardwork   Diet: good water, good fruit intake, lots of red meats    Outpatient Encounter Medications as of 10/15/2018  Medication Sig  . acetaminophen (TYLENOL) 500 MG tablet Take 500 mg by mouth every 6 (six) hours as needed for mild pain.  Marland Kitchen allopurinol (ZYLOPRIM) 100 MG tablet TAKE 2 TABLETS ONE TIME DAILY (Patient taking differently: Take 300 mg by mouth daily. )  . amLODipine (NORVASC) 10 MG tablet TAKE 1 TABLET (10 MG TOTAL) BY MOUTH DAILY.  Marland Kitchen ASPIR-LOW 81 MG EC tablet Take 1 tablet (81 mg total) by mouth daily.  . Blood Glucose Monitoring Suppl (ACCU-CHEK GUIDE) w/Device KIT 1 Units by Does not apply route as directed.  . Cholecalciferol (VITAMIN D3) 2000 units TABS Take 1 tablet by mouth at bedtime.  . docusate sodium (COLACE) 100 MG capsule Take 100 mg by mouth 2 (two) times daily as needed for mild constipation.  Marland Kitchen doxazosin (CARDURA) 8 MG tablet Take 1 tablet (8 mg total) by mouth at bedtime.  Marland Kitchen doxycycline (VIBRA-TABS) 100 MG tablet Take 1 tablet (100 mg total) by mouth 2 (two) times daily.  Marland Kitchen escitalopram (LEXAPRO) 10 MG tablet TAKE 1 TABLET EVERY DAY  . furosemide (LASIX) 40 MG tablet TAKE 1 TABLET (40 MG TOTAL) BY MOUTH DAILY.  Marland Kitchen glimepiride (AMARYL) 1 MG tablet TAKE 1 TABLET (1 MG TOTAL) BY MOUTH DAILY WITH BREAKFAST.  Marland Kitchen glucose blood (ACCU-CHEK GUIDE) test strip Check sugars three times daily and as needed when feeling ill E11.65, insulin use   . Insulin Glargine (LANTUS SOLOSTAR) 100 UNIT/ML Solostar Pen Inject 15 Units into the skin daily at 10 pm. QS 3 mo  . Insulin Pen Needle 31G X 5 MM MISC USE TO INJECT INSULIN DAILY  . LANCETS ULTRA THIN 30G MISC Check sugars three times daily and as needed when feeling ill E11.65, insulin use  . loratadine (CLARITIN) 10 MG tablet Take 10 mg by mouth daily.  Marland Kitchen losartan (COZAAR) 50 MG tablet TAKE 1 TABLET EVERY DAY  . Multiple Vitamin (MULTIVITAMIN WITH MINERALS) TABS tablet  Take 2 tablets by mouth every evening.  . Omega-3 Fatty Acids (FISH OIL PO) Take 1 tablet by mouth at bedtime.  Marland Kitchen omeprazole (PRILOSEC) 40 MG capsule TAKE 1 CAPSULE (40 MG TOTAL) BY MOUTH DAILY.  Marland Kitchen oxyCODONE (ROXICODONE) 5 MG immediate release tablet Take 1-2 tablets (5-10 mg total) by mouth every 4 (four) hours as needed for severe pain.  . rizatriptan (MAXALT) 10 MG tablet Take 1 tablet (10 mg total) by mouth as needed for migraine. May repeat in 2 hours if needed  . tacrolimus (PROGRAF) 1 MG capsule Take 1-2 mg by mouth 2 (two) times daily. 2 mg in the morning and 1 mg in the evening  . tamsulosin (FLOMAX) 0.4 MG CAPS capsule TAKE 2 CAPSULES (0.8 MG TOTAL) BY MOUTH DAILY.  . VOLTAREN 1 % GEL Apply 2 g topically 3 (three) times daily.   No facility-administered encounter medications on file as of 10/15/2018.     Activities of Daily Living In your present state of health, do you have any difficulty performing the following activities: 10/15/2018  Hearing? N  Vision? N  Difficulty concentrating or making decisions? Y  Walking or climbing stairs? Y  Dressing or bathing? N  Doing errands, shopping? N  Preparing Food and eating ? N  Using the Toilet? N  In the past six months, have you accidently leaked urine? N  Do you have problems with loss of bowel control? N  Managing your Medications? N  Managing your Finances? N  Housekeeping or managing your Housekeeping? N  Some recent data might be hidden    Patient Care  Team: Ria Bush, MD as PCP - General (Family Medicine)   Assessment:   This is a routine wellness examination for [place].  Exercise Activities and Dietary recommendations Current Exercise Habits: The patient does not participate in regular exercise at present, Exercise limited by: None identified  Goals    . Patient Stated     Starting 10/15/18, I will continue to take medications as prescribed.        Fall Risk Fall Risk  10/15/2018 10/06/2017 09/11/2017  Falls in the past year? 0 No No    Depression Screen PHQ 2/9 Scores 10/15/2018 10/06/2017 09/11/2017  PHQ - 2 Score 0 0 0  PHQ- 9 Score 0 0 -    Cognitive Function MMSE - Mini Mental State Exam 10/15/2018 10/06/2017  Orientation to time 5 5  Orientation to Place 5 5  Registration 3 3  Attention/ Calculation 0 0  Recall 3 3  Language- name 2 objects 0 0  Language- repeat 1 1  Language- follow 3 step command 0 3  Language- read & follow direction 0 0  Write a sentence 0 0  Copy design 0 0  Total score 17 20     PLEASE NOTE: A Mini-Cog screen was completed. Maximum score is 20. A value of 0 denotes this part of Folstein MMSE was not completed or the patient failed this part of the Mini-Cog screening.   Mini-Cog Screening Orientation to Time - Max 5 pts Orientation to Place - Max 5 pts Registration - Max 3 pts Recall - Max 3 pts Language Repeat - Max 1 pts Language Follow 3 Step Command - Max 3 pts     Immunization History  Administered Date(s) Administered  . Hep A / Hep B 01/16/2015, 01/23/2015, 02/06/2015  . Hepatitis B, adult 12/04/2015  . Influenza Whole 03/24/2010  . Influenza,inj,Quad PF,6+ Mos 04/22/2015, 04/06/2017, 04/23/2018  .  Pneumococcal Polysaccharide-23 04/22/2015  . Td 05/01/2007  . Tdap 12/14/2015    Screening Tests Health Maintenance  Topic Date Due  . HEMOGLOBIN A1C  08/08/2018  . OPHTHALMOLOGY EXAM  06/27/2019 (Originally 07/13/2018)  . INFLUENZA VACCINE  01/26/2019  . FOOT EXAM   02/06/2019  . COLONOSCOPY  09/04/2023  . DTaP/Tdap/Td (2 - Td) 12/13/2025  . TETANUS/TDAP  12/13/2025  . PNEUMOCOCCAL POLYSACCHARIDE VACCINE AGE 80-64 HIGH RISK  Completed  . HIV Screening  Completed       Plan:     I have personally reviewed, addressed, and noted the following in the patient's chart:  A. Medical and social history B. Use of alcohol, tobacco or illicit drugs  C. Current medications and supplements D. Functional ability and status E.  Nutritional status F.  Physical activity G. Advance directives H. List of other physicians I.  Hospitalizations, surgeries, and ER visits in previous 12 months J.  Vitals (unless it is a telemedicine encounter) K. Screenings to include hearing, vision, cognitive, depression L. Referrals and appointments   In addition, I have reviewed and discussed with patient certain preventive protocols, quality metrics, and best practice recommendations. A written personalized care plan for preventive services and recommendations were provided to patient.  With patient's permission, we connected on 10/15/18 at  8:30 AM EDT by a video enabled telemedicine application. Two patient identifiers were used to ensure the encounter occurred with the correct person. .   Patient was in home and writer was in office.   Signed,   Lindell Noe, MHA, BS, LPN Health Coach

## 2018-10-15 NOTE — Progress Notes (Signed)
PCP notes:   Health maintenance:  A1C - completed Eye exam - addressed  Abnormal screenings:   None  Patient concerns:   Patient reports issues with short-term memory and with loss of interest in activities since transplant.   Nurse concerns:  None  Next PCP appt:   10/18/18 @ 0830

## 2018-10-15 NOTE — Patient Instructions (Signed)
Colton Porter , Thank you for taking time to come for your Medicare Wellness Visit. I appreciate your ongoing commitment to your health goals. Please review the following plan we discussed and let me know if I can assist you in the future.   These are the goals we discussed: Goals    . Patient Stated     Starting 10/15/18, I will continue to take medications as prescribed.        This is a list of the screening recommended for you and due dates:  Health Maintenance  Topic Date Due  . Eye exam for diabetics  06/27/2019*  . Flu Shot  01/26/2019  . Complete foot exam   02/06/2019  . Hemoglobin A1C  04/16/2019  . Colon Cancer Screening  09/04/2023  . DTaP/Tdap/Td vaccine (2 - Td) 12/13/2025  . Tetanus Vaccine  12/13/2025  . Pneumococcal vaccine  Completed  . HIV Screening  Completed  *Topic was postponed. The date shown is not the original due date.   Preventive Care for Adults  A healthy lifestyle and preventive care can promote health and wellness. Preventive health guidelines for adults include the following key practices.  . A routine yearly physical is a good way to check with your health care provider about your health and preventive screening. It is a chance to share any concerns and updates on your health and to receive a thorough exam.  . Visit your dentist for a routine exam and preventive care every 6 months. Brush your teeth twice a day and floss once a day. Good oral hygiene prevents tooth decay and gum disease.  . The frequency of eye exams is based on your age, health, family medical history, use  of contact lenses, and other factors. Follow your health care provider's recommendations for frequency of eye exams.  . Eat a healthy diet. Foods like vegetables, fruits, whole grains, low-fat dairy products, and lean protein foods contain the nutrients you need without too many calories. Decrease your intake of foods high in solid fats, added sugars, and salt. Eat the right  amount of calories for you. Get information about a proper diet from your health care provider, if necessary.  . Regular physical exercise is one of the most important things you can do for your health. Most adults should get at least 150 minutes of moderate-intensity exercise (any activity that increases your heart rate and causes you to sweat) each week. In addition, most adults need muscle-strengthening exercises on 2 or more days a week.  Silver Sneakers may be a benefit available to you. To determine eligibility, you may visit the website: www.silversneakers.com or contact program at 480-229-6520 Mon-Fri between 8AM-8PM.   . Maintain a healthy weight. The body mass index (BMI) is a screening tool to identify possible weight problems. It provides an estimate of body fat based on height and weight. Your health care provider can find your BMI and can help you achieve or maintain a healthy weight.   For adults 20 years and older: ? A BMI below 18.5 is considered underweight. ? A BMI of 18.5 to 24.9 is normal. ? A BMI of 25 to 29.9 is considered overweight. ? A BMI of 30 and above is considered obese.   . Maintain normal blood lipids and cholesterol levels by exercising and minimizing your intake of saturated fat. Eat a balanced diet with plenty of fruit and vegetables. Blood tests for lipids and cholesterol should begin at age 26 and be repeated every  5 years. If your lipid or cholesterol levels are high, you are over 50, or you are at high risk for heart disease, you may need your cholesterol levels checked more frequently. Ongoing high lipid and cholesterol levels should be treated with medicines if diet and exercise are not working.  . If you smoke, find out from your health care provider how to quit. If you do not use tobacco, please do not start.  . If you choose to drink alcohol, please do not consume more than 2 drinks per day. One drink is considered to be 12 ounces (355 mL) of beer, 5  ounces (148 mL) of wine, or 1.5 ounces (44 mL) of liquor.  . If you are 79-34 years old, ask your health care provider if you should take aspirin to prevent strokes.  . Use sunscreen. Apply sunscreen liberally and repeatedly throughout the day. You should seek shade when your shadow is shorter than you. Protect yourself by wearing long sleeves, pants, a wide-brimmed hat, and sunglasses year round, whenever you are outdoors.  . Once a month, do a whole body skin exam, using a mirror to look at the skin on your back. Tell your health care provider of new moles, moles that have irregular borders, moles that are larger than a pencil eraser, or moles that have changed in shape or color.

## 2018-10-16 ENCOUNTER — Ambulatory Visit: Payer: Medicare HMO

## 2018-10-16 NOTE — Progress Notes (Signed)
I reviewed health advisor's note, was available for consultation, and agree with documentation and plan.  

## 2018-10-18 ENCOUNTER — Encounter: Payer: Self-pay | Admitting: Family Medicine

## 2018-10-18 ENCOUNTER — Ambulatory Visit (INDEPENDENT_AMBULATORY_CARE_PROVIDER_SITE_OTHER): Payer: Medicare HMO | Admitting: Family Medicine

## 2018-10-18 VITALS — BP 114/74 | HR 92 | Ht 65.5 in

## 2018-10-18 DIAGNOSIS — Z944 Liver transplant status: Secondary | ICD-10-CM | POA: Diagnosis not present

## 2018-10-18 DIAGNOSIS — N401 Enlarged prostate with lower urinary tract symptoms: Secondary | ICD-10-CM

## 2018-10-18 DIAGNOSIS — Z6841 Body Mass Index (BMI) 40.0 and over, adult: Secondary | ICD-10-CM | POA: Diagnosis not present

## 2018-10-18 DIAGNOSIS — I10 Essential (primary) hypertension: Secondary | ICD-10-CM | POA: Diagnosis not present

## 2018-10-18 DIAGNOSIS — F321 Major depressive disorder, single episode, moderate: Secondary | ICD-10-CM | POA: Diagnosis not present

## 2018-10-18 DIAGNOSIS — N183 Chronic kidney disease, stage 3 unspecified: Secondary | ICD-10-CM

## 2018-10-18 DIAGNOSIS — E1169 Type 2 diabetes mellitus with other specified complication: Secondary | ICD-10-CM

## 2018-10-18 DIAGNOSIS — I5032 Chronic diastolic (congestive) heart failure: Secondary | ICD-10-CM

## 2018-10-18 DIAGNOSIS — K7030 Alcoholic cirrhosis of liver without ascites: Secondary | ICD-10-CM

## 2018-10-18 DIAGNOSIS — Z794 Long term (current) use of insulin: Secondary | ICD-10-CM

## 2018-10-18 DIAGNOSIS — E785 Hyperlipidemia, unspecified: Secondary | ICD-10-CM | POA: Diagnosis not present

## 2018-10-18 DIAGNOSIS — J302 Other seasonal allergic rhinitis: Secondary | ICD-10-CM

## 2018-10-18 DIAGNOSIS — D696 Thrombocytopenia, unspecified: Secondary | ICD-10-CM | POA: Diagnosis not present

## 2018-10-18 DIAGNOSIS — M1A0790 Idiopathic chronic gout, unspecified ankle and foot, without tophus (tophi): Secondary | ICD-10-CM

## 2018-10-18 DIAGNOSIS — E1122 Type 2 diabetes mellitus with diabetic chronic kidney disease: Secondary | ICD-10-CM | POA: Diagnosis not present

## 2018-10-18 DIAGNOSIS — R5383 Other fatigue: Secondary | ICD-10-CM

## 2018-10-18 DIAGNOSIS — R351 Nocturia: Secondary | ICD-10-CM

## 2018-10-18 NOTE — Assessment & Plan Note (Signed)
Anticipate contributing to headache - rec continue flonase, start nasal saline irrigation PRN.

## 2018-10-18 NOTE — Assessment & Plan Note (Addendum)
Has not been checking BP - advised to monitor to ensure staying well controlled. Will ask my CMA to call later when he's at home for vitals check.

## 2018-10-18 NOTE — Progress Notes (Signed)
Virtual visit completed through [employer]. Due to national recommendations of social distancing due to [place] 19, a virtual visit is felt to be most appropriate for this patient at this time.   Patient location: in his car Provider location: [place] at [facility], office If any vitals were documented, they were collected by patient at home unless specified below.    BP 114/74 (BP Location: Left Arm, Patient Position: Sitting, Cuff Size: Large)   Pulse 92   Ht 5' 5.5" (1.664 m)   BMI 47.11 kg/m    CC: AMW f/u visit Subjective:    Patient ID: Colton Porter, male    DOB: 1966-06-05, 53 y.o.   MRN: [id number]  HPI: Colton Porter is a 53 y.o. male presenting on 10/18/2018 for Follow-up    Saw Katha Cabal Monday for medicare wellness visit. Note reviewed. Concerned with short term memory and anhedonia - attributes to low energy. Denies significant depression. Fatigue. Continues lexapro 86m daily.   Not checking BP or sugar recently.  Liver transplant 77/8295for alcoholic cirrhosis. Last liver clinic visit was 12/2017.  DM - compliant with amaryl 144mdaily and lantus 15u daily. Not checking sugars.   Saw renal 07/2018 - weight 290lbs. Allopurinol increased to 30056mue to elevated urate levels. Kidneys have been doing well.   All teeth were removed 05/2018 - no sinus trouble since then.   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 No changing moles on skin. Non smoker. Quit chewing tobacco 10/10/2015.  Alcohol - abstinent since 10/10/2014.   Lives with wife Occupation: full disability after cirrhosis dx, some working  Activity: some yardwork Diet: good water, good fruit intake     Relevant past  medical, surgical, family and social history reviewed and updated as indicated. Interim medical history since our last visit reviewed. Allergies and medications reviewed and updated. Outpatient Medications Prior to Visit  Medication Sig Dispense Refill  . acetaminophen (TYLENOL) 500 MG tablet Take 500 mg by mouth every 6 (six) hours as needed for mild pain.    . aMarland Kitchenlopurinol (ZYLOPRIM) 300 MG tablet Take 1 tablet by mouth daily.    . aMarland KitchenLODipine (NORVASC) 10 MG tablet TAKE 1 TABLET (10 MG TOTAL) BY MOUTH DAILY. 90 tablet 1  . ASPIR-LOW 81 MG EC tablet Take 1 tablet (81 mg total) by mouth daily. 90 tablet 3  . Blood Glucose Monitoring Suppl (ACCU-CHEK GUIDE) w/Device KIT 1 Units by Does not apply route as directed. 1 kit 0  . Cholecalciferol (VITAMIN D3) 2000 units TABS Take 1 tablet by mouth at bedtime.    . docusate sodium (COLACE) 100 MG capsule Take 100 mg by mouth 2 (two) times daily as needed for mild constipation.    . dMarland Kitchenxazosin (CARDURA) 8 MG tablet Take 1 tablet (8 mg total) by mouth at bedtime. 90 tablet 0  . escitalopram (LEXAPRO) 10 MG tablet TAKE 1 TABLET EVERY DAY 90 tablet 0  . furosemide (LASIX) 40 MG tablet TAKE 1 TABLET (40 MG TOTAL) BY MOUTH DAILY. 90 tablet 1  . glimepiride (AMARYL) 1 MG tablet TAKE 1 TABLET (1 MG TOTAL) BY MOUTH DAILY WITH BREAKFAST. 90 tablet 1  . glucose blood (ACCU-CHEK GUIDE) test strip Check sugars three times daily and as needed when feeling ill  E11.65, insulin use 100 each 11  . Insulin Glargine (LANTUS SOLOSTAR) 100 UNIT/ML Solostar Pen Inject 15 Units into the skin daily at 10 pm. QS 3 mo 5 pen 3  . Insulin Pen Needle 31G X 5 MM MISC USE TO INJECT INSULIN DAILY 100 each 3  . LANCETS ULTRA THIN 30G MISC Check sugars three times daily and as needed when feeling ill E11.65, insulin use 100 each 11  . loratadine (CLARITIN) 10 MG tablet Take 10 mg by mouth daily.    Marland Kitchen losartan (COZAAR) 50 MG tablet TAKE 1 TABLET EVERY DAY 90 tablet 1  . Multiple Vitamin  (MULTIVITAMIN WITH MINERALS) TABS tablet Take 2 tablets by mouth every evening.    . Omega-3 Fatty Acids (FISH OIL PO) Take 1 tablet by mouth at bedtime.    Marland Kitchen omeprazole (PRILOSEC) 40 MG capsule TAKE 1 CAPSULE (40 MG TOTAL) BY MOUTH DAILY. 90 capsule 1  . oxyCODONE (ROXICODONE) 5 MG immediate release tablet Take 1-2 tablets (5-10 mg total) by mouth every 4 (four) hours as needed for severe pain. 30 tablet 0  . rizatriptan (MAXALT) 10 MG tablet Take 1 tablet (10 mg total) by mouth as needed for migraine. May repeat in 2 hours if needed 10 tablet 0  . tacrolimus (PROGRAF) 1 MG capsule Take 1-2 mg by mouth 2 (two) times daily. 2 mg in the morning and 1 mg in the evening    . VOLTAREN 1 % GEL Apply 2 g topically 3 (three) times daily. 100 g 1  . tamsulosin (FLOMAX) 0.4 MG CAPS capsule TAKE 2 CAPSULES (0.8 MG TOTAL) BY MOUTH DAILY. (Patient not taking: Reported on 10/18/2018) 180 capsule 3  . allopurinol (ZYLOPRIM) 100 MG tablet TAKE 2 TABLETS ONE TIME DAILY (Patient taking differently: Take 300 mg by mouth daily. ) 180 tablet 1  . doxycycline (VIBRA-TABS) 100 MG tablet Take 1 tablet (100 mg total) by mouth 2 (two) times daily. 20 tablet 0   No facility-administered medications prior to visit.      Per HPI unless specifically indicated in ROS section below Review of Systems Objective:    BP 114/74 (BP Location: Left Arm, Patient Position: Sitting, Cuff Size: Large)   Pulse 92   Ht 5' 5.5" (1.664 m)   BMI 47.11 kg/m   Wt Readings from Last 3 Encounters:  04/23/18 287 lb 8 oz (130.4 kg)  03/02/18 286 lb 12 oz (130.1 kg)  02/20/18 286 lb (129.7 kg)     Physical exam: Gen: alert, NAD, not ill appearing Pulm: speaks in complete sentences without increased work of breathing Psych: normal mood, normal thought content      Results for orders placed or performed in visit on 10/15/18  VITAMIN D 25 Hydroxy (Vit-D Deficiency, Fractures)  Result Value Ref Range   VITD 41.68 30.00 - 100.00 ng/mL   Magnesium  Result Value Ref Range   Magnesium 1.5 1.5 - 2.5 mg/dL  PSA, Medicare  Result Value Ref Range   PSA 3.99 0.10 - 4.00 ng/ml  CBC with Differential/Platelet  Result Value Ref Range   WBC 6.1 4.0 - 10.5 K/uL   RBC 4.30 4.22 - 5.81 Mil/uL   Hemoglobin 12.2 (L) 13.0 - 17.0 g/dL   HCT 36.3 (L) 39.0 - 52.0 %   MCV 84.4 78.0 - 100.0 fl   MCHC 33.5 30.0 - 36.0 g/dL   RDW 16.4 (H) 11.5 - 15.5 %   Platelets 131.0 (L) 150.0 - 400.0 K/uL   Neutrophils  Relative % 73.6 43.0 - 77.0 %   Lymphocytes Relative 19.2 12.0 - 46.0 %   Monocytes Relative 4.7 3.0 - 12.0 %   Eosinophils Relative 2.1 0.0 - 5.0 %   Basophils Relative 0.4 0.0 - 3.0 %   Neutro Abs 4.5 1.4 - 7.7 K/uL   Lymphs Abs 1.2 0.7 - 4.0 K/uL   Monocytes Absolute 0.3 0.1 - 1.0 K/uL   Eosinophils Absolute 0.1 0.0 - 0.7 K/uL   Basophils Absolute 0.0 0.0 - 0.1 K/uL  Uric acid  Result Value Ref Range   Uric Acid, Serum 7.5 4.0 - 7.8 mg/dL  Hemoglobin A1c  Result Value Ref Range   Hgb A1c MFr Bld 7.8 (H) 4.6 - 6.5 %  Comprehensive metabolic panel  Result Value Ref Range   Sodium 138 135 - 145 mEq/L   Potassium 4.3 3.5 - 5.1 mEq/L   Chloride 102 96 - 112 mEq/L   CO2 28 19 - 32 mEq/L   Glucose, Bld 184 (H) 70 - 99 mg/dL   BUN 17 6 - 23 mg/dL   Creatinine, Ser 1.21 0.40 - 1.50 mg/dL   Total Bilirubin 0.6 0.2 - 1.2 mg/dL   Alkaline Phosphatase 92 39 - 117 U/L   AST 16 0 - 37 U/L   ALT 25 0 - 53 U/L   Total Protein 6.9 6.0 - 8.3 g/dL   Albumin 4.1 3.5 - 5.2 g/dL   Calcium 8.8 8.4 - 10.5 mg/dL   GFR 62.95 >60.00 mL/min  Lipid panel  Result Value Ref Range   Cholesterol 92 0 - 200 mg/dL   Triglycerides 298.0 (H) 0.0 - 149.0 mg/dL   HDL 26.10 (L) >39.00 mg/dL   VLDL 59.6 (H) 0.0 - 40.0 mg/dL   Total CHOL/HDL Ratio 4    NonHDL 65.61   LDL cholesterol, direct  Result Value Ref Range   Direct LDL 43.0 mg/dL   Depression screen [facility] 2/9 10/15/2018 10/06/2017 09/11/2017  Decreased Interest 0 0 0  Down, Depressed, Hopeless  0 0 0  PHQ - 2 Score 0 0 0  Altered sleeping 0 0 -  Tired, decreased energy 0 0 -  Change in appetite 0 0 -  Feeling bad or failure about yourself  0 0 -  Trouble concentrating 0 0 -  Moving slowly or fidgety/restless 0 0 -  Suicidal thoughts 0 0 -  PHQ-9 Score 0 0 -  Difficult doing work/chores Not difficult at all Not difficult at all -  Some recent data might be hidden    Assessment & Plan:   Problem List Items Addressed This Visit    Type 2 diabetes mellitus with other specified complication (HCC)    Chronic, deteriorated. Has not been checking sugars. Anticipate related to weight gain. RTC 3-4 mo DM f/u      Thrombocytopenia (HCC)    Chronic, stable.       Status post liver transplant ([place])   Other fatigue    Ongoing ?mood related. Will continue lexapro for now.       Morbid obesity with BMI of 50.0-59.9, adult ([place])   Essential hypertension    Has not been checking BP - advised to monitor to ensure staying well controlled. Will ask my CMA to call later when he's at home for vitals check.       Dyslipidemia    Chronic high trig, low HDL. Not on statin after liver transplant. He continues fish oil. Consider statin if trig remain elevated. The  ASCVD Risk score Mikey Bussing DC Jr., et al., 2013) failed to calculate for the following reasons:   The valid total cholesterol range is 130 to 320 mg/dL       Depression, major, single episode, moderate (HCC)    Chronic, endorses overall stable mood - will continue lexapro 32m daily.       CKD (chronic kidney disease) stage 3, GFR 30-59 ml/min (HCC)    Improved readings recently. Sees nephrology Dr PPosey Pronto      Chronic gout   BPH (benign prostatic hyperplasia)    Noted increasing trend in PSA, reviewed with patient - will recheck at f/u visit along with DRE.       Allergic rhinitis    Anticipate contributing to headache - rec continue flonase, start nasal saline irrigation PRN.       Alcoholic cirrhosis of liver without  ascites ([facility] - Primary    S/p liver transplant.      (HFpEF) heart failure with preserved ejection fraction (HCC)       No orders of the defined types were placed in this encounter.  No orders of the defined types were placed in this encounter.   Follow up plan: Return in about 4 months (around 02/17/2019) for follow up visit.  JRia Bush MD

## 2018-10-18 NOTE — Assessment & Plan Note (Signed)
S/p liver transplant. 

## 2018-10-18 NOTE — Assessment & Plan Note (Signed)
Ongoing ?mood related. Will continue lexapro for now.

## 2018-10-18 NOTE — Assessment & Plan Note (Signed)
Chronic, endorses overall stable mood - will continue lexapro 10mg  daily.

## 2018-10-18 NOTE — Assessment & Plan Note (Addendum)
Improved readings recently. Sees nephrology Dr Posey Pronto.

## 2018-10-18 NOTE — Assessment & Plan Note (Signed)
Chronic high trig, low HDL. Not on statin after liver transplant. He continues fish oil. Consider statin if trig remain elevated. The ASCVD Risk score Mikey Bussing DC Jr., et al., 2013) failed to calculate for the following reasons:   The valid total cholesterol range is 130 to 320 mg/dL

## 2018-10-18 NOTE — Assessment & Plan Note (Signed)
Chronic, stable 

## 2018-10-18 NOTE — Assessment & Plan Note (Signed)
Chronic, deteriorated. Has not been checking sugars. Anticipate related to weight gain. RTC 3-4 mo DM f/u

## 2018-10-18 NOTE — Assessment & Plan Note (Signed)
Noted increasing trend in PSA, reviewed with patient - will recheck at f/u visit along with DRE.

## 2018-11-01 ENCOUNTER — Other Ambulatory Visit: Payer: Self-pay | Admitting: Family Medicine

## 2018-11-02 ENCOUNTER — Other Ambulatory Visit: Payer: Self-pay | Admitting: Family Medicine

## 2018-11-07 ENCOUNTER — Other Ambulatory Visit: Payer: Self-pay | Admitting: Family Medicine

## 2018-12-03 ENCOUNTER — Other Ambulatory Visit: Payer: Self-pay | Admitting: Family Medicine

## 2018-12-07 DIAGNOSIS — Z944 Liver transplant status: Secondary | ICD-10-CM | POA: Diagnosis not present

## 2018-12-11 DIAGNOSIS — G4733 Obstructive sleep apnea (adult) (pediatric): Secondary | ICD-10-CM | POA: Diagnosis not present

## 2018-12-12 ENCOUNTER — Other Ambulatory Visit: Payer: Self-pay | Admitting: Family Medicine

## 2018-12-26 ENCOUNTER — Other Ambulatory Visit: Payer: Self-pay | Admitting: Family Medicine

## 2019-01-08 DIAGNOSIS — Z944 Liver transplant status: Secondary | ICD-10-CM | POA: Diagnosis not present

## 2019-01-08 DIAGNOSIS — N189 Chronic kidney disease, unspecified: Secondary | ICD-10-CM | POA: Diagnosis not present

## 2019-01-08 DIAGNOSIS — D899 Disorder involving the immune mechanism, unspecified: Secondary | ICD-10-CM | POA: Diagnosis not present

## 2019-01-10 ENCOUNTER — Other Ambulatory Visit: Payer: Self-pay | Admitting: Family Medicine

## 2019-01-18 ENCOUNTER — Other Ambulatory Visit: Payer: Self-pay

## 2019-01-18 ENCOUNTER — Encounter: Payer: Self-pay | Admitting: Family Medicine

## 2019-01-18 ENCOUNTER — Ambulatory Visit (INDEPENDENT_AMBULATORY_CARE_PROVIDER_SITE_OTHER): Payer: Medicare HMO | Admitting: Family Medicine

## 2019-01-18 VITALS — BP 130/72 | HR 81 | Temp 98.3°F | Ht 65.5 in | Wt 284.2 lb

## 2019-01-18 DIAGNOSIS — E1169 Type 2 diabetes mellitus with other specified complication: Secondary | ICD-10-CM

## 2019-01-18 DIAGNOSIS — I10 Essential (primary) hypertension: Secondary | ICD-10-CM

## 2019-01-18 DIAGNOSIS — Z794 Long term (current) use of insulin: Secondary | ICD-10-CM

## 2019-01-18 DIAGNOSIS — Z944 Liver transplant status: Secondary | ICD-10-CM | POA: Diagnosis not present

## 2019-01-18 LAB — POCT GLYCOSYLATED HEMOGLOBIN (HGB A1C): Hemoglobin A1C: 9.7 % — AB (ref 4.0–5.6)

## 2019-01-18 MED ORDER — GLIMEPIRIDE 2 MG PO TABS: 2.0000 mg | ORAL_TABLET | Freq: Every day | ORAL | 1 refills | Status: DC

## 2019-01-18 NOTE — Assessment & Plan Note (Signed)
Chronic, deteriorated. Increase amaryl to 2mg  daily. Discussed additional weekly GLP1 RA - he will price out at pharmacy and let me know insurance preferred med. No fmhx thyroid cancer. He will also renew efforts at following diabetic diet.

## 2019-01-18 NOTE — Assessment & Plan Note (Signed)
Chronic, stable. Continue current regimen. 

## 2019-01-18 NOTE — Progress Notes (Signed)
This visit was conducted in person.  BP 130/72   Pulse 81   Temp 98.3 F (36.8 C)   Ht 5' 5.5" (1.664 m)   Wt 284 lb 3 oz (128.9 kg)   BMI 46.57 kg/m    CC: DM f/u visit Subjective:    Patient ID: Colton Porter, male    DOB: September 03, 1965, 53 y.o.   MRN: [id number]  HPI: Colton Porter is a 53 y.o. male presenting on 01/18/2019 for Diabetes (follow up)   DM - does not regularly check sugars. Compliant with antihyperglycemic regimen which includes: amaryl 30m daily, lantus 15u daily. Denies hypoglycemic symptoms. Chronic R>L hand paresthesias. Last diabetic eye exam DUE. Pneumovax: 2016. Prevnar: not due. Glucometer brand: accu-chek. DSME: 08/2017. No know fmhx thyroid cancer Lab Results  Component Value Date   HGBA1C 9.7 (A) 01/18/2019   Diabetic Foot Exam - Simple   Simple Foot Form Diabetic Foot exam was performed with the following findings: Yes 01/18/2019  8:44 AM  Visual Inspection No deformities, no ulcerations, no other skin breakdown bilaterally: Yes Sensation Testing Intact to touch and monofilament testing bilaterally: Yes Pulse Check Posterior Tibialis and Dorsalis pulse intact bilaterally: Yes Comments Wears compression stockings    Lab Results  Component Value Date   MICROALBUR <0.7 08/27/2014     Liver disease - saw Dr ZCoralyn Pearlast week - good report. Prograf dose decreased.     Relevant past medical, surgical, family and social history reviewed and updated as indicated. Interim medical history since our last visit reviewed. Allergies and medications reviewed and updated. Outpatient Medications Prior to Visit  Medication Sig Dispense Refill  . acetaminophen (TYLENOL) 500 MG tablet Take 500 mg by mouth every 6 (six) hours as needed for mild pain.    .Marland Kitchenallopurinol (ZYLOPRIM) 300 MG tablet Take 1 tablet by mouth daily.    .Marland KitchenamLODipine (NORVASC) 10 MG tablet TAKE 1 TABLET EVERY DAY 90 tablet 1  . ASPIR-LOW 81 MG EC tablet Take 1 tablet (81 mg total) by  mouth daily. 90 tablet 3  . Blood Glucose Monitoring Suppl (ACCU-CHEK GUIDE) w/Device KIT 1 Units by Does not apply route as directed. 1 kit 0  . Cholecalciferol (VITAMIN D3) 2000 units TABS Take 1 tablet by mouth at bedtime.    . docusate sodium (COLACE) 100 MG capsule Take 100 mg by mouth 2 (two) times daily as needed for mild constipation.    .Marland Kitchendoxazosin (CARDURA) 8 MG tablet TAKE 1 TABLET BY MOUTH AT BEDTIME. 90 tablet 3  . escitalopram (LEXAPRO) 10 MG tablet TAKE 1 TABLET EVERY DAY 90 tablet 0  . furosemide (LASIX) 40 MG tablet TAKE 1 TABLET EVERY DAY 90 tablet 1  . glucose blood (ACCU-CHEK GUIDE) test strip Check sugars three times daily and as needed when feeling ill E11.65, insulin use 100 each 11  . Insulin Glargine (LANTUS SOLOSTAR) 100 UNIT/ML Solostar Pen Inject 15 Units into the skin daily at 10 pm. QS 3 mo 5 pen 3  . Insulin Pen Needle 31G X 5 MM MISC USE TO INJECT INSULIN DAILY 100 each 3  . LANCETS ULTRA THIN 30G MISC Check sugars three times daily and as needed when feeling ill E11.65, insulin use 100 each 11  . loratadine (CLARITIN) 10 MG tablet Take 10 mg by mouth daily.    .Marland Kitchenlosartan (COZAAR) 50 MG tablet TAKE 1 TABLET EVERY DAY 90 tablet 1  . Multiple Vitamin (MULTIVITAMIN WITH MINERALS) TABS tablet Take 2  tablets by mouth every evening.    . Omega-3 Fatty Acids (FISH OIL PO) Take 1 tablet by mouth at bedtime.    Marland Kitchen omeprazole (PRILOSEC) 40 MG capsule TAKE 1 CAPSULE EVERY DAY 90 capsule 1  . oxyCODONE (ROXICODONE) 5 MG immediate release tablet Take 1-2 tablets (5-10 mg total) by mouth every 4 (four) hours as needed for severe pain. 30 tablet 0  . rizatriptan (MAXALT) 10 MG tablet Take 1 tablet (10 mg total) by mouth as needed for migraine. May repeat in 2 hours if needed 10 tablet 0  . tacrolimus (PROGRAF) 1 MG capsule Take 1 mg by mouth 2 (two) times daily.    Marland Kitchen glimepiride (AMARYL) 1 MG tablet TAKE 1 TABLET (1 MG TOTAL) BY MOUTH DAILY WITH BREAKFAST. 90 tablet 0  .  tacrolimus (PROGRAF) 1 MG capsule Take 1-2 mg by mouth 2 (two) times daily. 2 mg in the morning and 1 mg in the evening    . tamsulosin (FLOMAX) 0.4 MG CAPS capsule TAKE 2 CAPSULES (0.8 MG TOTAL) BY MOUTH DAILY. (Patient not taking: Reported on 10/18/2018) 180 capsule 3  . VOLTAREN 1 % GEL Apply 2 g topically 3 (three) times daily. 100 g 1   No facility-administered medications prior to visit.      Per HPI unless specifically indicated in ROS section below Review of Systems Objective:    BP 130/72   Pulse 81   Temp 98.3 F (36.8 C)   Ht 5' 5.5" (1.664 m)   Wt 284 lb 3 oz (128.9 kg)   BMI 46.57 kg/m   Wt Readings from Last 3 Encounters:  01/18/19 284 lb 3 oz (128.9 kg)  04/23/18 287 lb 8 oz (130.4 kg)  03/02/18 286 lb 12 oz (130.1 kg)    Physical Exam Vitals signs and nursing note reviewed.  Constitutional:      General: He is not in acute distress.    Appearance: He is well-developed.  HENT:     Head: Normocephalic and atraumatic.     Right Ear: External ear normal.     Left Ear: External ear normal.     Nose: Nose normal.     Mouth/Throat:     Pharynx: No oropharyngeal exudate.  Eyes:     General: No scleral icterus.    Conjunctiva/sclera: Conjunctivae normal.     Pupils: Pupils are equal, round, and reactive to light.  Neck:     Musculoskeletal: Normal range of motion and neck supple.  Cardiovascular:     Rate and Rhythm: Normal rate and regular rhythm.     Heart sounds: Normal heart sounds. No murmur.  Pulmonary:     Effort: Pulmonary effort is normal. No respiratory distress.     Breath sounds: Normal breath sounds. No wheezing or rales.  Musculoskeletal:     Comments: See HPI for foot exam if done  Lymphadenopathy:     Cervical: No cervical adenopathy.  Skin:    General: Skin is warm and dry.     Findings: No rash.       Results for orders placed or performed in visit on 01/18/19  POCT glycosylated hemoglobin (Hb A1C)  Result Value Ref Range    Hemoglobin A1C 9.7 (A) 4.0 - 5.6 %   HbA1c POC (<> result, manual entry)     HbA1c, POC (prediabetic range)     HbA1c, POC (controlled diabetic range)     Assessment & Plan:   Problem List Items Addressed This Visit  Type 2 diabetes mellitus with other specified complication (HCC) - Primary    Chronic, deteriorated. Increase amaryl to 79m daily. Discussed additional weekly GLP1 RA - he will price out at pharmacy and let me know insurance preferred med. No fmhx thyroid cancer. He will also renew efforts at following diabetic diet.       Relevant Medications   glimepiride (AMARYL) 2 MG tablet   Other Relevant Orders   POCT glycosylated hemoglobin (Hb A1C) (Completed)   Status post liver transplant ([place]   Relevant Medications   tacrolimus (PROGRAF) 1 MG capsule   Essential hypertension    Chronic, stable. Continue current regimen.           Meds ordered this encounter  Medications  . glimepiride (AMARYL) 2 MG tablet    Sig: Take 1 tablet (2 mg total) by mouth daily with breakfast.    Dispense:  90 tablet    Refill:  1   Orders Placed This Encounter  Procedures  . POCT glycosylated hemoglobin (Hb A1C)    Patient Instructions  Sugars are higher - increase glimepiride to 2 mg nightly with dinner.  Price out trulicity or ozempic - check with your pharmacy which would be preferred.  Schedule eye exam as you're due.  Return in 3 months for follow up visit.    Follow up plan: Return in about 3 months (around 04/20/2019) for follow up visit.  JRia Bush MD

## 2019-01-18 NOTE — Patient Instructions (Addendum)
Sugars are higher - increase glimepiride to 2 mg nightly with dinner.  Price out trulicity or ozempic - check with your pharmacy which would be preferred.  Schedule eye exam as you're due.  Return in 3 months for follow up visit.

## 2019-01-24 ENCOUNTER — Encounter: Payer: Self-pay | Admitting: Family Medicine

## 2019-01-25 MED ORDER — OZEMPIC (0.25 OR 0.5 MG/DOSE) 2 MG/1.5ML ~~LOC~~ SOPN: PEN_INJECTOR | SUBCUTANEOUS | 1 refills | Status: DC

## 2019-01-25 NOTE — Telephone Encounter (Signed)
Pt's wife has called back asking if Dr Darnell Level has responded to the MyChart note.

## 2019-02-06 IMAGING — CR DG SINUSES COMPLETE 3+V
3 series · 3 of 3 positions shown · non-contrast
Comparison: None.

CLINICAL DATA: 52-year-old male with chronic maxillary and ethmoid
sinusitis. Headaches. Initial encounter.

EXAM:
PARANASAL SINUSES - COMPLETE 3 + VIEW

[[person_name] *]
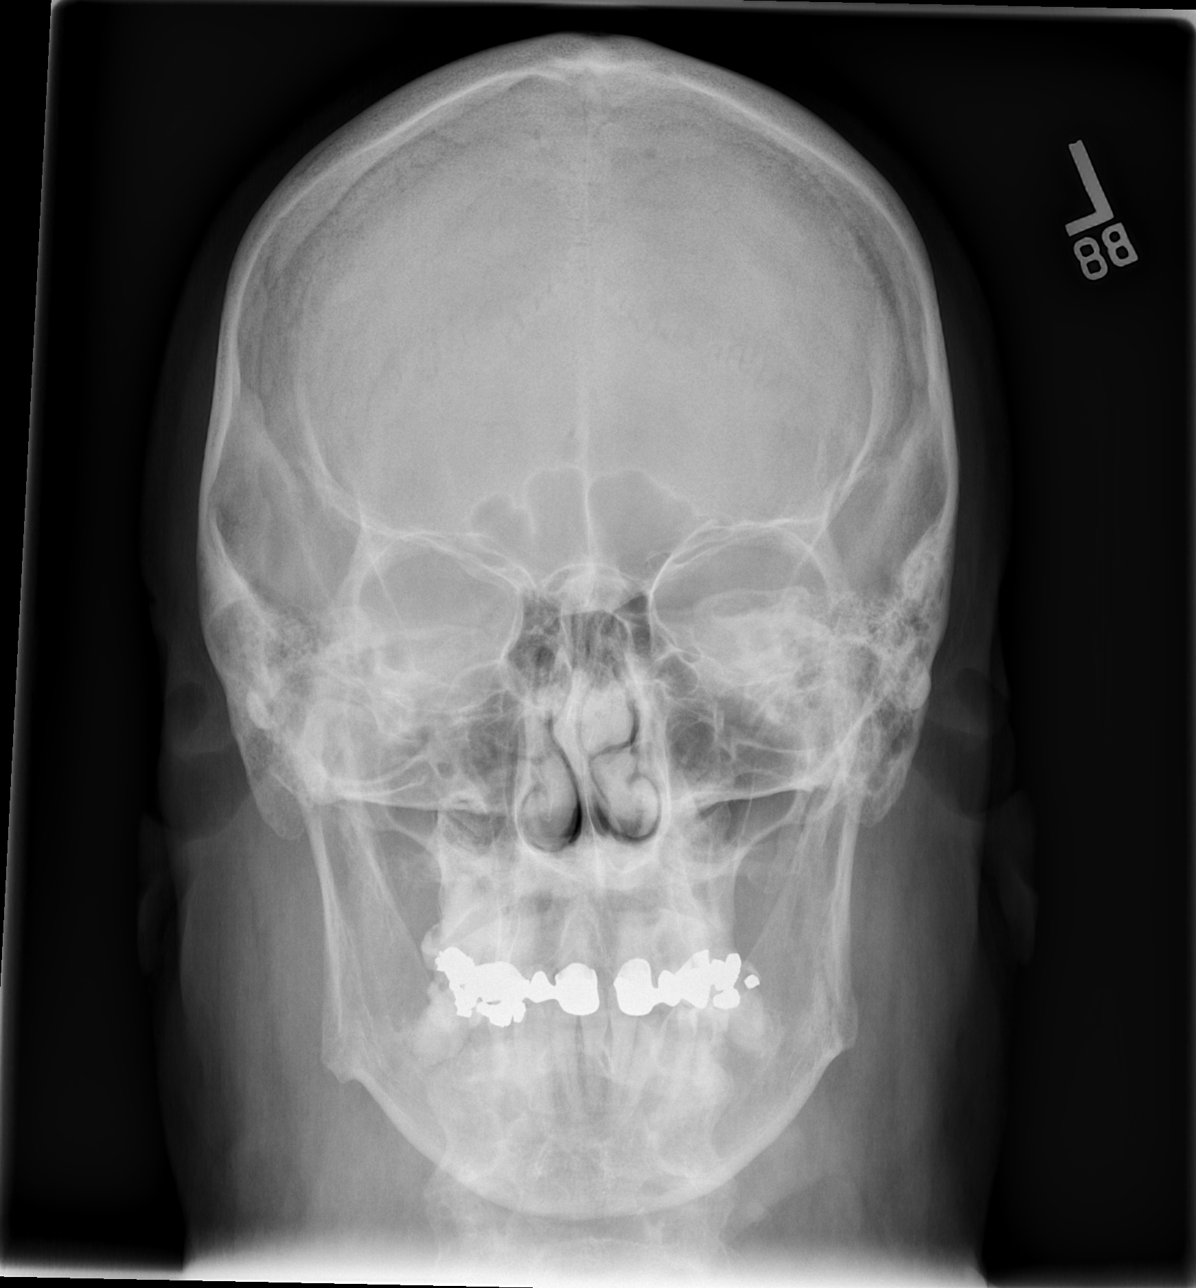

[w waters *]
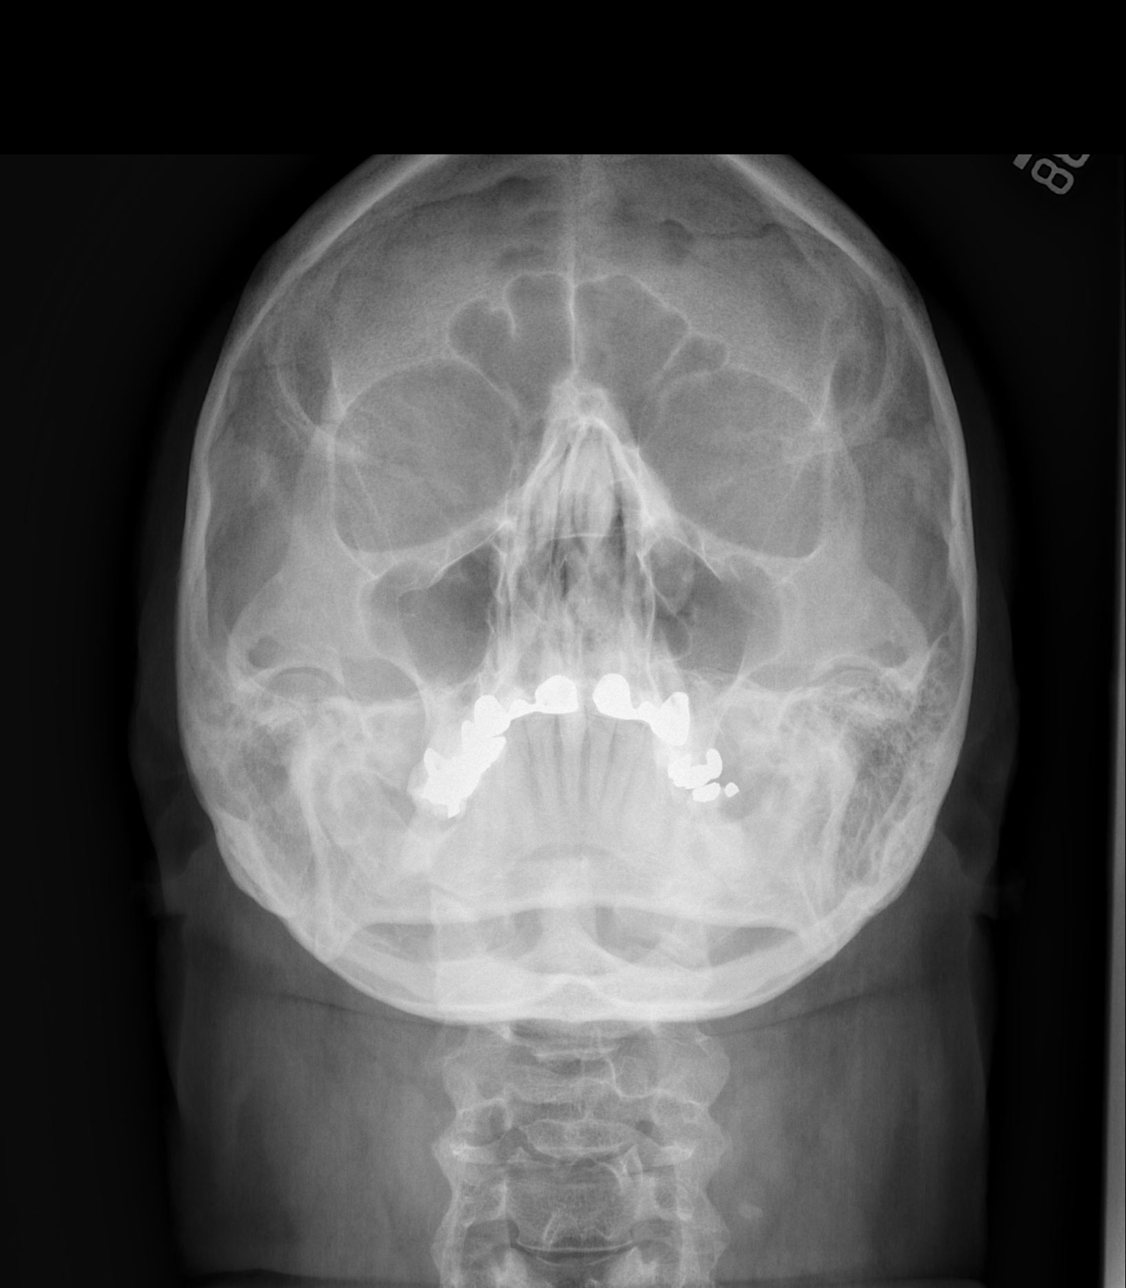

[w skull lat *]
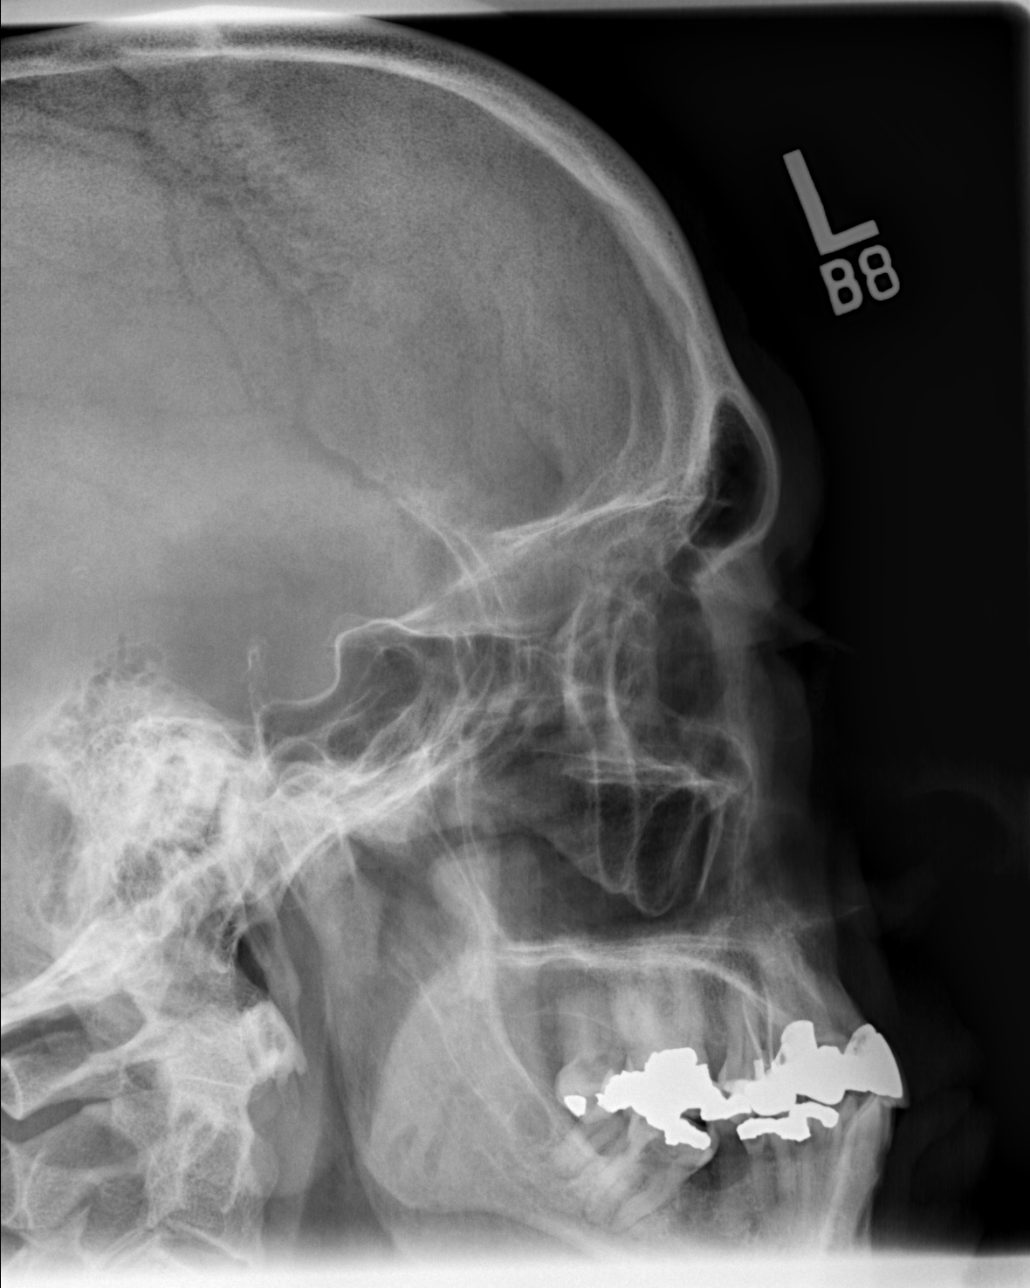

[3 of 3 positions shown; findings below may reference images not displayed]

FINDINGS: Mild right maxillary sinus mucosal thickening. Remainder of
paranasal sinuses appear clear by plain film exam.
IMPRESSION: Mild right maxillary sinus mucosal thickening.

## 2019-02-13 ENCOUNTER — Other Ambulatory Visit: Payer: Self-pay | Admitting: Family Medicine

## 2019-02-20 ENCOUNTER — Other Ambulatory Visit: Payer: Self-pay | Admitting: Family Medicine

## 2019-02-21 NOTE — Telephone Encounter (Signed)
Last filled on 7/31 for 1 pen with 1 refill. I could not figure out if he was due for a refill. Please review

## 2019-03-11 ENCOUNTER — Other Ambulatory Visit: Payer: Self-pay | Admitting: Family Medicine

## 2019-03-11 DIAGNOSIS — Z944 Liver transplant status: Secondary | ICD-10-CM | POA: Diagnosis not present

## 2019-03-14 ENCOUNTER — Other Ambulatory Visit: Payer: Self-pay | Admitting: Family Medicine

## 2019-03-26 DIAGNOSIS — G4733 Obstructive sleep apnea (adult) (pediatric): Secondary | ICD-10-CM | POA: Diagnosis not present

## 2019-04-11 ENCOUNTER — Other Ambulatory Visit: Payer: Self-pay | Admitting: Family Medicine

## 2019-04-16 ENCOUNTER — Other Ambulatory Visit: Payer: Self-pay | Admitting: Family Medicine

## 2019-04-23 ENCOUNTER — Other Ambulatory Visit: Payer: Self-pay

## 2019-04-23 ENCOUNTER — Ambulatory Visit (INDEPENDENT_AMBULATORY_CARE_PROVIDER_SITE_OTHER): Payer: Medicare HMO | Admitting: Family Medicine

## 2019-04-23 ENCOUNTER — Encounter: Payer: Self-pay | Admitting: Family Medicine

## 2019-04-23 VITALS — BP 134/78 | HR 82 | Temp 97.9°F | Ht 65.5 in | Wt 282.6 lb

## 2019-04-23 DIAGNOSIS — Z23 Encounter for immunization: Secondary | ICD-10-CM

## 2019-04-23 DIAGNOSIS — E1169 Type 2 diabetes mellitus with other specified complication: Secondary | ICD-10-CM | POA: Diagnosis not present

## 2019-04-23 DIAGNOSIS — Z794 Long term (current) use of insulin: Secondary | ICD-10-CM | POA: Diagnosis not present

## 2019-04-23 LAB — POCT GLYCOSYLATED HEMOGLOBIN (HGB A1C): Hemoglobin A1C: 7.7 % — AB (ref 4.0–5.6)

## 2019-04-23 MED ORDER — FLUTICASONE PROPIONATE 50 MCG/ACT NA SUSP: 2.0000 | Freq: Every day | NASAL | 6 refills | Status: DC

## 2019-04-23 MED ORDER — RIZATRIPTAN BENZOATE 10 MG PO TABS: 10.0000 mg | ORAL_TABLET | ORAL | 3 refills | Status: DC | PRN

## 2019-04-23 NOTE — Assessment & Plan Note (Signed)
Chronic, improved. Congratulated on healthy changes. He did not tolerate ozempic. Encouraged he schedule eye exam.

## 2019-04-23 NOTE — Patient Instructions (Addendum)
Flu shot today Schedule eye exam as you're due.  You are doing well today. A1c was much better! Goal will be <7%.  Return as needed or in 3 months for diabetes follow up visit.

## 2019-04-23 NOTE — Progress Notes (Signed)
This visit was conducted in person.  BP 134/78 (BP Location: Left Arm, Patient Position: Sitting, Cuff Size: Large)   Pulse 82   Temp 97.9 F (36.6 C) (Temporal)   Ht 5' 5.5" (1.664 m)   Wt 282 lb 9 oz (128.2 kg)   SpO2 95%   BMI 46.31 kg/m    CC: 3 mo f/u visit Subjective:    Patient ID: Colton Porter, male    DOB: December 05, 1965, 53 y.o.   MRN: [id number]  HPI: Colton Porter is a 53 y.o. male presenting on 04/23/2019 for Follow-up (Here for 3 mo f/u.)   Wife's coworker tested positive for Covid. Her test result is pending.   DM - does note regularly check sugars. Compliant with antihyperglycemic regimen which includes: ozempic 0.'5mg'$  weekly (*new), amaryl '2mg'$  daily, lantus 15u daily. He stopped ozempic due to nausea and diarrhea. He stopped G2 gatorade and changed to powerade zero's. Stays active throughout the day, no regular walking due to heel pain (bone spur). Denies low sugars or hypoglycemic symptoms. +R hand paresthesias. Last diabetic eye exam DUE (last 06/2017). Pneumovax: 2016. Prevnar: not due yet. Glucometer brand: accuchek. DSME: 08/2017. No fmhx thyroid cancer.  Lab Results  Component Value Date   HGBA1C 7.7 (A) 04/23/2019   Diabetic Foot Exam - Simple   No data filed     Lab Results  Component Value Date   MICROALBUR <0.7 08/27/2014    Migraines - maxalt refilled. No recent migraines.      Relevant past medical, surgical, family and social history reviewed and updated as indicated. Interim medical history since our last visit reviewed. Allergies and medications reviewed and updated. Outpatient Medications Prior to Visit  Medication Sig Dispense Refill  . acetaminophen (TYLENOL) 500 MG tablet Take 500 mg by mouth every 6 (six) hours as needed for mild pain.    Marland Kitchen allopurinol (ZYLOPRIM) 300 MG tablet Take 1 tablet by mouth daily.    Marland Kitchen amLODipine (NORVASC) 10 MG tablet TAKE 1 TABLET EVERY DAY 90 tablet 2  . ASPIR-LOW 81 MG EC tablet Take 1 tablet (81 mg  total) by mouth daily. 90 tablet 3  . Blood Glucose Monitoring Suppl (ACCU-CHEK GUIDE) w/Device KIT 1 Units by Does not apply route as directed. 1 kit 0  . Cholecalciferol (VITAMIN D3) 2000 units TABS Take 1 tablet by mouth at bedtime.    . docusate sodium (COLACE) 100 MG capsule Take 100 mg by mouth 2 (two) times daily as needed for mild constipation.    Marland Kitchen doxazosin (CARDURA) 8 MG tablet TAKE 1 TABLET BY MOUTH AT BEDTIME. 90 tablet 3  . escitalopram (LEXAPRO) 10 MG tablet TAKE 1 TABLET EVERY DAY 90 tablet 1  . furosemide (LASIX) 40 MG tablet TAKE 1 TABLET EVERY DAY 90 tablet 1  . glimepiride (AMARYL) 2 MG tablet Take 1 tablet (2 mg total) by mouth daily with breakfast. 90 tablet 1  . glucose blood (ACCU-CHEK GUIDE) test strip Check sugars three times daily and as needed when feeling ill E11.65, insulin use 100 each 11  . Insulin Glargine (LANTUS SOLOSTAR) 100 UNIT/ML Solostar Pen Inject 15 Units into the skin daily at 10 pm. QS 3 mo 5 pen 3  . Insulin Pen Needle 31G X 5 MM MISC USE TO INJECT INSULIN DAILY 100 each 3  . LANCETS ULTRA THIN 30G MISC Check sugars three times daily and as needed when feeling ill E11.65, insulin use 100 each 11  . loratadine (CLARITIN) 10  MG tablet Take 10 mg by mouth daily.    Marland Kitchen losartan (COZAAR) 50 MG tablet TAKE 1 TABLET EVERY DAY 90 tablet 1  . Multiple Vitamin (MULTIVITAMIN WITH MINERALS) TABS tablet Take 2 tablets by mouth every evening.    . Omega-3 Fatty Acids (FISH OIL PO) Take 1 tablet by mouth at bedtime.    Marland Kitchen omeprazole (PRILOSEC) 40 MG capsule TAKE 1 CAPSULE EVERY DAY 90 capsule 1  . oxyCODONE (ROXICODONE) 5 MG immediate release tablet Take 1-2 tablets (5-10 mg total) by mouth every 4 (four) hours as needed for severe pain. 30 tablet 0  . tacrolimus (PROGRAF) 1 MG capsule Take 1 mg by mouth 2 (two) times daily.    . rizatriptan (MAXALT) 10 MG tablet Take 1 tablet (10 mg total) by mouth as needed for migraine. May repeat in 2 hours if needed 10 tablet 0    No facility-administered medications prior to visit.      Per HPI unless specifically indicated in ROS section below Review of Systems Objective:    BP 134/78 (BP Location: Left Arm, Patient Position: Sitting, Cuff Size: Large)   Pulse 82   Temp 97.9 F (36.6 C) (Temporal)   Ht 5' 5.5" (1.664 m)   Wt 282 lb 9 oz (128.2 kg)   SpO2 95%   BMI 46.31 kg/m   Wt Readings from Last 3 Encounters:  04/23/19 282 lb 9 oz (128.2 kg)  01/18/19 284 lb 3 oz (128.9 kg)  04/23/18 287 lb 8 oz (130.4 kg)    Physical Exam Vitals signs and nursing note reviewed.  Constitutional:      General: He is not in acute distress.    Appearance: Normal appearance. He is well-developed. He is obese. He is not ill-appearing.  HENT:     Head: Normocephalic and atraumatic.     Right Ear: External ear normal.     Left Ear: External ear normal.     Nose: Nose normal.     Mouth/Throat:     Mouth: Mucous membranes are moist.     Pharynx: Oropharynx is clear. No posterior oropharyngeal erythema.  Eyes:     General: No scleral icterus.    Conjunctiva/sclera: Conjunctivae normal.     Pupils: Pupils are equal, round, and reactive to light.  Neck:     Musculoskeletal: Normal range of motion and neck supple.  Cardiovascular:     Rate and Rhythm: Normal rate and regular rhythm.     Pulses: Normal pulses.     Heart sounds: Normal heart sounds. No murmur.  Pulmonary:     Effort: Pulmonary effort is normal. No respiratory distress.     Breath sounds: Normal breath sounds. No wheezing, rhonchi or rales.  Musculoskeletal:     Right lower leg: No edema.     Left lower leg: No edema.     Comments:  See HPI for foot exam if done Compression stockings in place  Lymphadenopathy:     Cervical: No cervical adenopathy.  Skin:    General: Skin is warm and dry.     Findings: No rash.  Neurological:     Mental Status: He is alert.  Psychiatric:        Mood and Affect: Mood normal.        Behavior: Behavior normal.        Results for orders placed or performed in visit on 04/23/19  POCT glycosylated hemoglobin (Hb A1C)  Result Value Ref Range   Hemoglobin A1C 7.7 (  A) 4.0 - 5.6 %   HbA1c POC (<> result, manual entry)     HbA1c, POC (prediabetic range)     HbA1c, POC (controlled diabetic range)     Assessment & Plan:   Problem List Items Addressed This Visit    Type 2 diabetes mellitus with other specified complication ([place]) - Primary    Chronic, improved. Congratulated on healthy changes. He did not tolerate ozempic. Encouraged he schedule eye exam.       Relevant Orders   POCT glycosylated hemoglobin (Hb A1C) (Completed)    Other Visit Diagnoses    Need for influenza vaccination       Relevant Orders   Flu Vaccine QUAD 36+ mos IM (Completed)       Meds ordered this encounter  Medications  . rizatriptan (MAXALT) 10 MG tablet    Sig: Take 1 tablet (10 mg total) by mouth as needed for migraine. May repeat in 2 hours if needed    Dispense:  10 tablet    Refill:  3  . fluticasone (FLONASE) 50 MCG/ACT nasal spray    Sig: Place 2 sprays into both nostrils daily.    Dispense:  16 g    Refill:  6   Orders Placed This Encounter  Procedures  . Flu Vaccine QUAD 36+ mos IM  . POCT glycosylated hemoglobin (Hb A1C)    Follow up plan: Return in about 3 months (around 07/24/2019) for follow up visit.  Ria Bush, MD

## 2019-05-10 ENCOUNTER — Telehealth: Payer: Self-pay

## 2019-05-10 DIAGNOSIS — Z944 Liver transplant status: Secondary | ICD-10-CM | POA: Diagnosis not present

## 2019-05-10 NOTE — Telephone Encounter (Signed)
Ok would consider low dose statin like pravastatin if ok by liver transplant clinic. Would have him check with them at their next OV and if ok by them I will prescribe.

## 2019-05-10 NOTE — Telephone Encounter (Signed)
Colton Porter from [facility] called to let us know they spoke with patient to review his diabetic management and noticed patient is not on statin and would like to notify provider to review with patient in the future. Patient expressed interest in discussing this. FYI to PCP

## 2019-05-13 ENCOUNTER — Encounter: Payer: Self-pay | Admitting: Family Medicine

## 2019-05-13 DIAGNOSIS — E1122 Type 2 diabetes mellitus with diabetic chronic kidney disease: Secondary | ICD-10-CM | POA: Diagnosis not present

## 2019-05-13 DIAGNOSIS — N183 Chronic kidney disease, stage 3 unspecified: Secondary | ICD-10-CM | POA: Diagnosis not present

## 2019-05-13 DIAGNOSIS — D631 Anemia in chronic kidney disease: Secondary | ICD-10-CM | POA: Diagnosis not present

## 2019-05-13 DIAGNOSIS — M109 Gout, unspecified: Secondary | ICD-10-CM | POA: Diagnosis not present

## 2019-05-13 DIAGNOSIS — I129 Hypertensive chronic kidney disease with stage 1 through stage 4 chronic kidney disease, or unspecified chronic kidney disease: Secondary | ICD-10-CM | POA: Diagnosis not present

## 2019-05-13 DIAGNOSIS — N2581 Secondary hyperparathyroidism of renal origin: Secondary | ICD-10-CM | POA: Diagnosis not present

## 2019-05-13 MED ORDER — LOSARTAN POTASSIUM 50 MG PO TABS: 50.0000 mg | ORAL_TABLET | Freq: Every day | ORAL | 1 refills | Status: DC

## 2019-05-13 NOTE — Telephone Encounter (Signed)
E-scribed refill to [facility].  Notified pt via [place].

## 2019-05-13 NOTE — Telephone Encounter (Signed)
Left message on vm per dpr relaying Dr. G's message.  

## 2019-05-15 NOTE — Telephone Encounter (Signed)
Left message on vm per dpr relaying Dr. G's message.  

## 2019-05-24 ENCOUNTER — Other Ambulatory Visit: Payer: Self-pay | Admitting: Family Medicine

## 2019-05-30 ENCOUNTER — Other Ambulatory Visit: Payer: Self-pay | Admitting: Family Medicine

## 2019-06-19 MED ORDER — LOVASTATIN 20 MG PO TABS: 20.0000 mg | ORAL_TABLET | ORAL | 3 refills | Status: DC

## 2019-06-19 NOTE — Telephone Encounter (Signed)
Again received notice from pharmacy about cholesterol medication for patient. I'd like to start him on once weekly lovastatin. Let us know how he does with this, to touch base with liver transplant clinic about med next time he sees them.

## 2019-06-19 NOTE — Telephone Encounter (Addendum)
Spoke with pt relaying Dr. Synthia Innocent message.  Pt verbalizes understanding.  However, pt requests Dr. Synthia Innocent message be sent to him via [place] so it will remind him to contact liver transplant clinic.   Sent message.

## 2019-06-19 NOTE — Addendum Note (Signed)
Addended by: Ria Bush on: 06/19/2019 09:14 AM   Modules accepted: Orders

## 2019-06-28 DIAGNOSIS — U071 COVID-19: Secondary | ICD-10-CM

## 2019-06-28 DIAGNOSIS — J1282 Pneumonia due to coronavirus disease 2019: Secondary | ICD-10-CM

## 2019-06-28 HISTORY — DX: Pneumonia due to coronavirus disease 2019: J12.82

## 2019-06-28 HISTORY — DX: COVID-19: U07.1

## 2019-07-03 DIAGNOSIS — G4733 Obstructive sleep apnea (adult) (pediatric): Secondary | ICD-10-CM | POA: Diagnosis not present

## 2019-07-09 DIAGNOSIS — Z944 Liver transplant status: Secondary | ICD-10-CM | POA: Diagnosis not present

## 2019-07-12 ENCOUNTER — Telehealth: Payer: Self-pay | Admitting: *Deleted

## 2019-07-12 ENCOUNTER — Other Ambulatory Visit: Payer: Self-pay

## 2019-07-12 ENCOUNTER — Emergency Department
Admission: EM | Admit: 2019-07-12 | Discharge: 2019-07-12 | Disposition: A | Discharge status: Home / Self Care | Payer: Medicare HMO | Attending: Emergency Medicine | Admitting: Emergency Medicine

## 2019-07-12 DIAGNOSIS — R509 Fever, unspecified: Secondary | ICD-10-CM | POA: Diagnosis not present

## 2019-07-12 DIAGNOSIS — Z5321 Procedure and treatment not carried out due to patient leaving prior to being seen by health care provider: Secondary | ICD-10-CM | POA: Diagnosis not present

## 2019-07-12 DIAGNOSIS — R519 Headache, unspecified: Secondary | ICD-10-CM | POA: Diagnosis not present

## 2019-07-12 DIAGNOSIS — R0902 Hypoxemia: Secondary | ICD-10-CM | POA: Diagnosis not present

## 2019-07-12 LAB — COMPREHENSIVE METABOLIC PANEL
ALT: 39 U/L (ref 0–44)
AST: 27 U/L (ref 15–41)
Albumin: 4.2 g/dL (ref 3.5–5.0)
Alkaline Phosphatase: 88 U/L (ref 38–126)
Anion gap: 9 (ref 5–15)
BUN: 23 mg/dL — ABNORMAL HIGH (ref 6–20)
CO2: 26 mmol/L (ref 22–32)
Calcium: 8.8 mg/dL — ABNORMAL LOW (ref 8.9–10.3)
Chloride: 98 mmol/L (ref 98–111)
Creatinine, Ser: 1.96 mg/dL — ABNORMAL HIGH (ref 0.61–1.24)
GFR calc Af Amer: 44 mL/min — ABNORMAL LOW (ref >60)
GFR calc non Af Amer: 38 mL/min — ABNORMAL LOW (ref >60)
Glucose, Bld: 332 mg/dL — ABNORMAL HIGH (ref 70–99)
Potassium: 4.1 mmol/L (ref 3.5–5.1)
Sodium: 133 mmol/L — ABNORMAL LOW (ref 135–145)
Total Bilirubin: 1.3 mg/dL — ABNORMAL HIGH (ref 0.3–1.2)
Total Protein: 7.9 g/dL (ref 6.5–8.1)

## 2019-07-12 LAB — CBC
HCT: 39.4 % (ref 39.0–52.0)
Hemoglobin: 13 g/dL (ref 13.0–17.0)
MCH: 28.3 pg (ref 26.0–34.0)
MCHC: 33 g/dL (ref 30.0–36.0)
MCV: 85.8 fL (ref 80.0–100.0)
Platelets: 151 10*3/uL (ref 150–400)
RBC: 4.59 MIL/uL (ref 4.22–5.81)
RDW: 14 % (ref 11.5–15.5)
WBC: 6.9 10*3/uL (ref 4.0–10.5)
nRBC: 0 % (ref 0.0–0.2)

## 2019-07-12 NOTE — Telephone Encounter (Signed)
Patient called back stating that he has a fever 99.8, body aches, no energy, headache and head congestion. Patient stated that he feels that he may have a sinus infection. Patient stated that he started with symptoms Wednesday night and seems to be getting worse. Patient denies any difficulty breathing or SOB.  After speaking with Dr. [name] he recommended that patient be tested for covid.  Called  [facility] testing line at [phone] and was on hold for over 20 minutes, so I hung up.  Advised patient that he does need to be tested and he stated that he will go to [facility] to be tested and will call back if he has a problem with getting tested there.  Advised patient that  Dr. [name] stated that it is okay for him to take a small amount of advil or tylenol for his symtoms and push fluids and he verbalized understanding. . Patient is to monitor himself and if he gets worse over the weekend he should go to an Urgent Care or ER.

## 2019-07-12 NOTE — Telephone Encounter (Signed)
Agree with above. Do recommend testing ASAP.

## 2019-07-12 NOTE — Telephone Encounter (Signed)
Patient's wife Suanne Marker left a voicemail stating that her husband has covid symptoms. Suanne Marker stated that her husband is a liver transplant patient. Suanne Marker stated that he is having chills, fever, headache and body aches. Mrs. Toves stated that she is wanting to know where she can take him for a rapid covid test? Called Mrs. Maggart back and it went straight to voicemail. Left a voicemail for her to call the office back.

## 2019-07-12 NOTE — ED Notes (Signed)
Patient to stat desk in no acute distress stating that he is going to leave. Patient encouraged to stay and given an update on wait time. Patient states that he is going to stay at this time.

## 2019-07-12 NOTE — ED Triage Notes (Addendum)
States Wednesday started having sinus pain, states not feeling well for the past couple days, states [facility] was seen [facility] who said his [facility] was low, took care of grandson whose mom was +covid this week. Pt reports he feels much better at this time and his wife made the decision for him to come to the ER to be checked. Pt is noted to be a liver transplant patient

## 2019-07-15 ENCOUNTER — Emergency Department (HOSPITAL_COMMUNITY)
Admission: EM | Admit: 2019-07-15 | Discharge: 2019-07-15 | Discharge status: Against Medical Advice | Payer: Medicare HMO | Source: Home / Self Care | Attending: Emergency Medicine | Admitting: Emergency Medicine

## 2019-07-15 ENCOUNTER — Encounter (HOSPITAL_COMMUNITY): Payer: Self-pay | Admitting: *Deleted

## 2019-07-15 ENCOUNTER — Other Ambulatory Visit: Payer: Self-pay

## 2019-07-15 ENCOUNTER — Emergency Department (HOSPITAL_COMMUNITY): Payer: Medicare HMO

## 2019-07-15 ENCOUNTER — Telehealth: Payer: Self-pay

## 2019-07-15 DIAGNOSIS — W19XXXA Unspecified fall, initial encounter: Secondary | ICD-10-CM | POA: Diagnosis not present

## 2019-07-15 DIAGNOSIS — E119 Type 2 diabetes mellitus without complications: Secondary | ICD-10-CM | POA: Diagnosis not present

## 2019-07-15 DIAGNOSIS — E1140 Type 2 diabetes mellitus with diabetic neuropathy, unspecified: Secondary | ICD-10-CM | POA: Diagnosis present

## 2019-07-15 DIAGNOSIS — Z944 Liver transplant status: Secondary | ICD-10-CM | POA: Diagnosis not present

## 2019-07-15 DIAGNOSIS — Z794 Long term (current) use of insulin: Secondary | ICD-10-CM | POA: Diagnosis not present

## 2019-07-15 DIAGNOSIS — U071 COVID-19: Secondary | ICD-10-CM | POA: Diagnosis present

## 2019-07-15 DIAGNOSIS — N400 Enlarged prostate without lower urinary tract symptoms: Secondary | ICD-10-CM | POA: Diagnosis present

## 2019-07-15 DIAGNOSIS — J1282 Pneumonia due to coronavirus disease 2019: Secondary | ICD-10-CM | POA: Diagnosis present

## 2019-07-15 DIAGNOSIS — D61818 Other pancytopenia: Secondary | ICD-10-CM | POA: Diagnosis present

## 2019-07-15 DIAGNOSIS — R509 Fever, unspecified: Secondary | ICD-10-CM | POA: Diagnosis not present

## 2019-07-15 DIAGNOSIS — N1832 Chronic kidney disease, stage 3b: Secondary | ICD-10-CM | POA: Diagnosis present

## 2019-07-15 DIAGNOSIS — Z825 Family history of asthma and other chronic lower respiratory diseases: Secondary | ICD-10-CM | POA: Diagnosis not present

## 2019-07-15 DIAGNOSIS — Z89022 Acquired absence of left finger(s): Secondary | ICD-10-CM | POA: Diagnosis not present

## 2019-07-15 DIAGNOSIS — Z6841 Body Mass Index (BMI) 40.0 and over, adult: Secondary | ICD-10-CM | POA: Diagnosis not present

## 2019-07-15 DIAGNOSIS — Z888 Allergy status to other drugs, medicaments and biological substances status: Secondary | ICD-10-CM | POA: Diagnosis not present

## 2019-07-15 DIAGNOSIS — S2232XA Fracture of one rib, left side, initial encounter for closed fracture: Secondary | ICD-10-CM | POA: Diagnosis present

## 2019-07-15 DIAGNOSIS — Z885 Allergy status to narcotic agent status: Secondary | ICD-10-CM | POA: Diagnosis not present

## 2019-07-15 DIAGNOSIS — Z801 Family history of malignant neoplasm of trachea, bronchus and lung: Secondary | ICD-10-CM | POA: Diagnosis not present

## 2019-07-15 DIAGNOSIS — N183 Chronic kidney disease, stage 3 unspecified: Secondary | ICD-10-CM | POA: Insufficient documentation

## 2019-07-15 DIAGNOSIS — J9601 Acute respiratory failure with hypoxia: Secondary | ICD-10-CM | POA: Diagnosis present

## 2019-07-15 DIAGNOSIS — I130 Hypertensive heart and chronic kidney disease with heart failure and stage 1 through stage 4 chronic kidney disease, or unspecified chronic kidney disease: Secondary | ICD-10-CM | POA: Diagnosis present

## 2019-07-15 DIAGNOSIS — Z823 Family history of stroke: Secondary | ICD-10-CM | POA: Diagnosis not present

## 2019-07-15 DIAGNOSIS — R05 Cough: Secondary | ICD-10-CM | POA: Insufficient documentation

## 2019-07-15 DIAGNOSIS — E871 Hypo-osmolality and hyponatremia: Secondary | ICD-10-CM | POA: Diagnosis present

## 2019-07-15 DIAGNOSIS — I5032 Chronic diastolic (congestive) heart failure: Secondary | ICD-10-CM | POA: Diagnosis present

## 2019-07-15 DIAGNOSIS — E785 Hyperlipidemia, unspecified: Secondary | ICD-10-CM | POA: Diagnosis present

## 2019-07-15 DIAGNOSIS — S2231XA Fracture of one rib, right side, initial encounter for closed fracture: Secondary | ICD-10-CM | POA: Diagnosis not present

## 2019-07-15 DIAGNOSIS — I10 Essential (primary) hypertension: Secondary | ICD-10-CM | POA: Diagnosis not present

## 2019-07-15 DIAGNOSIS — R569 Unspecified convulsions: Secondary | ICD-10-CM | POA: Diagnosis not present

## 2019-07-15 DIAGNOSIS — R52 Pain, unspecified: Secondary | ICD-10-CM

## 2019-07-15 DIAGNOSIS — I959 Hypotension, unspecified: Secondary | ICD-10-CM | POA: Diagnosis not present

## 2019-07-15 DIAGNOSIS — E1122 Type 2 diabetes mellitus with diabetic chronic kidney disease: Secondary | ICD-10-CM | POA: Diagnosis present

## 2019-07-15 DIAGNOSIS — M549 Dorsalgia, unspecified: Secondary | ICD-10-CM | POA: Diagnosis not present

## 2019-07-15 DIAGNOSIS — K219 Gastro-esophageal reflux disease without esophagitis: Secondary | ICD-10-CM | POA: Diagnosis present

## 2019-07-15 DIAGNOSIS — D84821 Immunodeficiency due to drugs: Secondary | ICD-10-CM | POA: Diagnosis present

## 2019-07-15 DIAGNOSIS — S2232XD Fracture of one rib, left side, subsequent encounter for fracture with routine healing: Secondary | ICD-10-CM | POA: Diagnosis not present

## 2019-07-15 DIAGNOSIS — W1830XA Fall on same level, unspecified, initial encounter: Secondary | ICD-10-CM | POA: Diagnosis present

## 2019-07-15 DIAGNOSIS — R5383 Other fatigue: Secondary | ICD-10-CM

## 2019-07-15 DIAGNOSIS — Z79899 Other long term (current) drug therapy: Secondary | ICD-10-CM | POA: Insufficient documentation

## 2019-07-15 DIAGNOSIS — G4733 Obstructive sleep apnea (adult) (pediatric): Secondary | ICD-10-CM | POA: Diagnosis present

## 2019-07-15 DIAGNOSIS — R109 Unspecified abdominal pain: Secondary | ICD-10-CM | POA: Diagnosis not present

## 2019-07-15 DIAGNOSIS — R0602 Shortness of breath: Secondary | ICD-10-CM | POA: Diagnosis not present

## 2019-07-15 DIAGNOSIS — R0902 Hypoxemia: Secondary | ICD-10-CM | POA: Diagnosis not present

## 2019-07-15 HISTORY — DX: Type 2 diabetes mellitus without complications: E11.9

## 2019-07-15 LAB — URINALYSIS, ROUTINE W REFLEX MICROSCOPIC
Bilirubin Urine: NEGATIVE
Glucose, UA: NEGATIVE mg/dL
Hgb urine dipstick: NEGATIVE
Ketones, ur: NEGATIVE mg/dL
Leukocytes,Ua: NEGATIVE
Nitrite: NEGATIVE
Protein, ur: 30 mg/dL — AB
Specific Gravity, Urine: 1.017 (ref 1.005–1.030)
pH: 5 (ref 5.0–8.0)

## 2019-07-15 LAB — CBC WITH DIFFERENTIAL/PLATELET
Abs Immature Granulocytes: 0.02 10*3/uL (ref 0.00–0.07)
Basophils Absolute: 0 10*3/uL (ref 0.0–0.1)
Basophils Relative: 0 %
Eosinophils Absolute: 0 10*3/uL (ref 0.0–0.5)
Eosinophils Relative: 0 %
HCT: 40.1 % (ref 39.0–52.0)
Hemoglobin: 13.4 g/dL (ref 13.0–17.0)
Immature Granulocytes: 1 %
Lymphocytes Relative: 20 %
Lymphs Abs: 0.8 10*3/uL (ref 0.7–4.0)
MCH: 28.7 pg (ref 26.0–34.0)
MCHC: 33.4 g/dL (ref 30.0–36.0)
MCV: 85.9 fL (ref 80.0–100.0)
Monocytes Absolute: 0.4 10*3/uL (ref 0.1–1.0)
Monocytes Relative: 10 %
Neutro Abs: 2.8 10*3/uL (ref 1.7–7.7)
Neutrophils Relative %: 69 %
Platelets: 121 10*3/uL — ABNORMAL LOW (ref 150–400)
RBC: 4.67 MIL/uL (ref 4.22–5.81)
RDW: 14.2 % (ref 11.5–15.5)
WBC: 4 10*3/uL (ref 4.0–10.5)
nRBC: 0 % (ref 0.0–0.2)

## 2019-07-15 LAB — RESPIRATORY PANEL BY RT PCR (FLU A&B, COVID)
Influenza A by PCR: NEGATIVE
Influenza B by PCR: NEGATIVE
SARS Coronavirus 2 by RT PCR: POSITIVE — AB

## 2019-07-15 LAB — COMPREHENSIVE METABOLIC PANEL
ALT: 45 U/L — ABNORMAL HIGH (ref 0–44)
AST: 35 U/L (ref 15–41)
Albumin: 3.5 g/dL (ref 3.5–5.0)
Alkaline Phosphatase: 72 U/L (ref 38–126)
Anion gap: 12 (ref 5–15)
BUN: 24 mg/dL — ABNORMAL HIGH (ref 6–20)
CO2: 24 mmol/L (ref 22–32)
Calcium: 9 mg/dL (ref 8.9–10.3)
Chloride: 99 mmol/L (ref 98–111)
Creatinine, Ser: 1.84 mg/dL — ABNORMAL HIGH (ref 0.61–1.24)
GFR calc Af Amer: 48 mL/min — ABNORMAL LOW (ref >60)
GFR calc non Af Amer: 41 mL/min — ABNORMAL LOW (ref >60)
Glucose, Bld: 206 mg/dL — ABNORMAL HIGH (ref 70–99)
Potassium: 4 mmol/L (ref 3.5–5.1)
Sodium: 135 mmol/L (ref 135–145)
Total Bilirubin: 1.1 mg/dL (ref 0.3–1.2)
Total Protein: 7.5 g/dL (ref 6.5–8.1)

## 2019-07-15 LAB — PROTIME-INR
INR: 1 (ref 0.8–1.2)
Prothrombin Time: 12.7 seconds (ref 11.4–15.2)

## 2019-07-15 LAB — LACTIC ACID, PLASMA: Lactic Acid, Venous: 1.1 mmol/L (ref 0.5–1.9)

## 2019-07-15 NOTE — Telephone Encounter (Signed)
See other note. Patient is at ER now

## 2019-07-15 NOTE — Telephone Encounter (Signed)
Noted. Will await ER evaluation.  

## 2019-07-15 NOTE — ED Triage Notes (Signed)
C/o generalized bodyaches onset fri. States he went to [facility] on fri. Had blood work rapid covid  Was neg. C/o feeling very tired and sleeping all weekend. Patient is a liver transplant 2017

## 2019-07-15 NOTE — ED Notes (Signed)
Pt no answer for repeat vital signs.

## 2019-07-15 NOTE — Discharge Instructions (Addendum)
Make sure you keep drinking plenty of fluids. I want you to hold your lasix for a few days. You can restart it again on Saturday 07/20/19. Your chest x-ray looks fine. Your COVID testing is still pending. I was hoping to have had the result by now but aren't going to make you wait in the ER any longer just for that result. It should be back later tonight or tomorrow at the latest. You can check on my-chart for the results. You need to continue to quarantine in the mean time.

## 2019-07-15 NOTE — ED Notes (Signed)
Pt verbalized understanding of d/c instructions, follow up care, s/s requiring return to ed and need for isolation until covid results return.

## 2019-07-15 NOTE — Telephone Encounter (Signed)
I spoke with pt; pt is pulling up to ED now. FYI to Dr Darnell Level.

## 2019-07-15 NOTE — Telephone Encounter (Signed)
[facility] Night - Client TELEPHONE ADVICE RECORD [facility] Patient Name: Colton Porter Gender: Male DOB: [birth year] Age: 54 Y 9 M 29 D Return Phone Number: [phone] (Primary), [phone] (Secondary) Address[place] [address] Client [place] Night - Client Client Site [facility] Physician Ria Bush - MD Contact Type Call Who Is Calling Patient / Member / Family / Caregiver Call Type Triage / Clinical Caller Name Suanne Marker Relationship To Patient Spouse Return Phone Number [phone] (Primary) Chief Complaint BREATHING - shortness of breath or sounds breathless Reason for Call Symptomatic / Request for [place] states her husband was sent to [facility] by ambulance for covid sx due to blood oxygen being in low 80s. He has diarrhea, fever, chills. Translation No Nurse Assessment Nurse: Vallery Sa, RN, Cathy Date/Time (Eastern Time): 07/15/2019 8:10:38 AM Confirm and document reason for call. If symptomatic, describe symptoms. ---Suanne Marker states that Adryen was transported to [facility] ER 3 days ago with possible COVID concerns (shortness of breath, diarrhea, fever). He left the ER without being seen. No severe breathing difficulty. No chest pain. He states he developed a headache, body aches, diarrhea, chills and fever 3-4 days ago. He passed urine about 2 hours ago. Alert and responsive. Has the patient had close contact with a person known or suspected to have the novel coronavirus illness OR traveled / lives in area with major community spread (including international travel) in the last 14 days from the onset of symptoms? * If Asymptomatic, screen for exposure and travel within the last 14 days. ---Yes Does the patient have any new or worsening symptoms? ---Yes Will a triage be completed? ---Yes Related visit to  physician within the last 2 weeks? ---Yes Does the PT have any chronic conditions? (i.e. diabetes, asthma, this includes High risk factors for pregnancy, etc.) ---Yes List chronic conditions. ---Diabetes, Liver transplant 2017, High Blood Pressure Is this a behavioral health or substance abuse call? ---No PLEASE NOTE: All timestamps contained within this report are represented as [place] Standard Time. CONFIDENTIALTY NOTICE: This fax transmission is intended only for the addressee. It contains information that is legally privileged, confidential or otherwise protected from use or disclosure. If you are not the intended recipient, you are strictly prohibited from reviewing, disclosing, copying using or disseminating any of this information or taking any action in reliance on or regarding this information. If you have received this fax in error, please notify us immediately by telephone so that we can arrange for its return to [name]. Phone: [phone], Toll-Free: [phone], Fax: [phone] Page: 2 of 2 Call Id: [id number] Guidelines Guideline Title Affirmed Question Affirmed Notes Nurse Date/Time Eilene Ghazi Time) Coronavirus (COVID-19) - Diagnosed or Suspected MILD difficulty breathing (e.g., minimal/no SOB at rest, SOB with walking, pulse <100) [place], RN, [facility] 07/15/2019 8:26:14 AM Disp. Time Eilene Ghazi Time) Disposition Final User 07/15/2019 8:29:23 AM Go to ED Now (or PCP triage) Yes Vallery Sa, RN, Rosey [facility] Disagree/Comply Comply Caller Understands Yes PreDisposition Call Doctor Care Advice Given Per Guideline GO TO ED NOW (OR PCP TRIAGE): * IF NO PCP (PRIMARY CARE PROVIDER) SECOND-LEVEL TRIAGE: You need to be seen within the next hour. Go to the [facility] at _____________ [place] as soon as you can. CARE ADVICE given per CORONAVIRUS (COVID-19) - DIAGNOSED OR SUSPECTED (Adult) guideline. Referrals [facility] - ED

## 2019-07-15 NOTE — Telephone Encounter (Signed)
Left message on vm per dpr asking pt to call back.  Need an update on his sxs and if he had COVID test done.

## 2019-07-15 NOTE — Telephone Encounter (Signed)
plz call for update. Did he get Covid tested? How are symptoms now?

## 2019-07-16 ENCOUNTER — Telehealth (HOSPITAL_COMMUNITY): Payer: Self-pay

## 2019-07-16 ENCOUNTER — Other Ambulatory Visit: Payer: Self-pay | Admitting: Nurse Practitioner

## 2019-07-16 DIAGNOSIS — U071 COVID-19: Secondary | ICD-10-CM

## 2019-07-16 DIAGNOSIS — E1169 Type 2 diabetes mellitus with other specified complication: Secondary | ICD-10-CM

## 2019-07-16 NOTE — Progress Notes (Signed)
  I connected by phone with Colton Porter on 07/16/2019 at 9:00 AM to discuss the potential use of an new treatment for mild to moderate COVID-19 viral infection in non-hospitalized patients.  This patient is a 54 y.o. male that meets the FDA criteria for Emergency Use Authorization of bamlanivimab or casirivimab\imdevimab.  Has a (+) direct SARS-CoV-2 viral test result  Has mild or moderate COVID-19   Is ? 54 years of age and weighs ? 40 kg  Is NOT hospitalized due to COVID-19  Is NOT requiring oxygen therapy or requiring an increase in baseline oxygen flow rate due to COVID-19  Is within 10 days of symptom onset  Has at least one of the high risk factor(s) for progression to severe COVID-19 and/or hospitalization as defined in EUA.  Specific high risk criteria : Diabetes Patient Active Problem List   Diagnosis Date Noted  . Earlobe lesion, left 02/05/2018  . Chronic heel pain, left 10/14/2017  . Anejaculation 08/09/2017  . Chronic gout 05/22/2017  . Foot swelling 04/12/2017  . Disorder of ejaculation 04/06/2017  . Dyspnea 11/18/2016  . BPH (benign prostatic hyperplasia) 10/05/2016  . Status post liver transplant ([place]) 03/15/2016  . Portal hypertensive gastropathy ([place]) 12/04/2015  . Type 2 diabetes mellitus with other specified complication ([place]) [id number]  . Gynecomastia   . CKD (chronic kidney disease) stage 3, GFR 30-59 ml/min ([facility]) 11/04/2015  . Depression, major, single episode, moderate ([place]) 07/09/2015  . Headache 02/19/2015  . Insomnia 12/06/2014  . Hypomagnesemia   . Other fatigue 11/18/2014  . Internal hemorrhoid 10/01/2014  . Erectile dysfunction 10/01/2014  . Alcoholic cirrhosis of liver without ascites ([place]) 07/28/2014  . (HFpEF) heart failure with preserved ejection fraction ([place]) 11/01/2013  . Essential hypertension 06/04/2013  . OSA on CPAP 07/11/2012  . History of alcohol dependence ([place]) 07/11/2012  . Thrombocytopenia ([place]) 12/15/2011  . Morbid  obesity with BMI of 50.0-59.9, adult ([place]) 11/16/2011  . Routine general medical examination at a health care facility 09/22/2010  . NEUROPATHY 05/27/2009  . Pedal edema 09/22/2008  . Dyslipidemia 02/15/2007  . Allergic rhinitis 02/15/2007  . GERD 02/15/2007     I have spoken and communicated the following to the patient or parent/caregiver:  1. FDA has authorized the emergency use of bamlanivimab and casirivimab\imdevimab for the treatment of mild to moderate COVID-19 in adults and pediatric patients with positive results of direct SARS-CoV-2 viral testing who are 43 years of age and older weighing at least 40 kg, and who are at high risk for progressing to severe COVID-19 and/or hospitalization.  2. The significant known and potential risks and benefits of bamlanivimab and casirivimab\imdevimab, and the extent to which such potential risks and benefits are unknown.  3. Information on available alternative treatments and the risks and benefits of those alternatives, including clinical trials.  4. Patients treated with bamlanivimab and casirivimab\imdevimab should continue to self-isolate and use infection control measures (e.g., wear mask, isolate, social distance, avoid sharing personal items, clean and disinfect "high touch" surfaces, and frequent handwashing) according to CDC guidelines.   5. The patient or parent/caregiver has the option to accept or refuse bamlanivimab or casirivimab\imdevimab .  After reviewing this information with the patient, The patient agreed to proceed with receiving the bamlanimivab infusion and will be provided a copy of the Fact sheet prior to receiving the infusion.Fenton Foy 07/16/2019 9:00 AM

## 2019-07-16 NOTE — ED Provider Notes (Signed)
[place] EMERGENCY DEPARTMENT Provider Note   CSN: [id number] Arrival date & time: 07/15/19  1109     History Chief Complaint  Patient presents with  . Generalized Body Aches    Colton Porter is a 54 y.o. male.  HPI   54 year old male with generalized body aches.  Onset Friday.  Persistent since then.  He reports that he was tested for Covid at that time but the result was still pending.  Continues to feel very fatigued.  Sleeping a lot.  Generalized body aches.  Occasional cough.  No dyspnea.  No appetite.  Is been trying to stay hydrated with water and Powerade though.  No vomiting or diarrhea.  No specific urinary complaints.  He has a history of a liver transplant few years ago.  He reports compliance with his medications.  Past Medical History:  Diagnosis Date  . Alcohol dependence ([place]) 07/11/2012  . Alcoholic cirrhosis of liver with ascites ([place]) 07/2014   s./p transplant 12/2015  . Allergy   . Anemia   . Cellulitis of left leg   . Chronic diastolic heart failure ([place]) 11/01/2013  . CKD (chronic kidney disease) stage 3, GFR 30-59 ml/min 11/04/2015  . Diabetes mellitus without complication ([place])   . GERD (gastroesophageal reflux disease)   . Hyperlipidemia   . Hypertension   . Neuropathy   . OSA on CPAP 07/11/2012   HST 07/2013:  AHI 39/hr.    . Thrombocytopenia ([place]) 12/15/2011    Patient Active Problem List   Diagnosis Date Noted  . [place]obe lesion, left 02/05/2018  . Chronic heel pain, left 10/14/2017  . Anejaculation 08/09/2017  . Chronic gout 05/22/2017  . Foot swelling 04/12/2017  . Disorder of ejaculation 04/06/2017  . Dyspnea 11/18/2016  . BPH (benign prostatic hyperplasia) 10/05/2016  . Status post liver transplant ([address]) 03/15/2016  . Portal hypertensive gastropathy ([place]) 12/04/2015  . Type 2 diabetes mellitus with other specified complication ([place]) 03/50/0938  . Gynecomastia   . CKD (chronic kidney disease) stage 3, GFR 30-59 ml/min  (HCC) 11/04/2015  . Depression, major, single episode, moderate ([place]) 07/09/2015  . Headache 02/19/2015  . Insomnia 12/06/2014  . Hypomagnesemia   . Other fatigue 11/18/2014  . Internal hemorrhoid 10/01/2014  . Erectile dysfunction 10/01/2014  . Alcoholic cirrhosis of liver without ascites ([place]) 07/28/2014  . (HFpEF) heart failure with preserved ejection fraction ([place]) 11/01/2013  . Essential hypertension 06/04/2013  . OSA on CPAP 07/11/2012  . History of alcohol dependence ([place]) 07/11/2012  . Thrombocytopenia ([place]) 12/15/2011  . Morbid obesity with BMI of 50.0-59.9, adult ([place]) 11/16/2011  . Routine general medical examination at a health care facility 09/22/2010  . NEUROPATHY 05/27/2009  . Pedal edema 09/22/2008  . Dyslipidemia 02/15/2007  . Allergic rhinitis 02/15/2007  . GERD 02/15/2007    Past Surgical History:  Procedure Laterality Date  . COLONOSCOPY  08/2013   hyperplastic polyps, hemorrhoids Ardis Hughs)  . 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 FINGER AMPUTATION  1997   left 4th finger - radial arm saw  . LEFT AND RIGHT HEART CATHETERIZATION WITH CORONARY ANGIOGRAM N/A 06/10/2013   Procedure: LEFT AND RIGHT HEART CATHETERIZATION WITH CORONARY ANGIOGRAM;  Surgeon: Blane Ohara, MD;  Location: [facility] C[facility] LAB;  Service: Cardiovascular;  Laterality: N/A;  . LIVER TRANSPLANTATION  18/2993   alcoholic cirrhosis ([facility] at [facility])       Family History  Problem Relation Age of Onset  . Stroke  Mother   . Emphysema Mother   . Hypertension Father   . Heart disease Father   . Emphysema Father   . Lung cancer Maternal Aunt   . Stroke Paternal Grandmother   . Heart attack Neg Hx     Social History   Tobacco Use  . Smoking status: Never Smoker  . Smokeless tobacco: Former Systems developer    Types: Chew  Substance Use Topics  . Alcohol use: No    Alcohol/week: 0.0 standard drinks    Comment: 4-6 drinks daily - NO ETOH SINCE APRIL  2016  . Drug use: Yes    Comment: remote use of marijuana in the past, has since quit    Home Medications Prior to Admission medications   Medication Sig Start Date End Date Taking? Authorizing Provider  acetaminophen (TYLENOL) 500 MG tablet Take 500 mg by mouth every 6 (six) hours as needed for mild pain.   Yes [provider]  allopurinol (ZYLOPRIM) 300 MG tablet Take 3 tablets by mouth daily.  09/18/18  Yes [provider]  amLODipine (NORVASC) 10 MG tablet TAKE 1 TABLET EVERY DAY 03/14/19  Yes Ria Bush, MD  ASPIR-LOW 81 MG EC tablet Take 1 tablet (81 mg total) by mouth daily. 04/06/17  Yes Ria Bush, MD  Blood Glucose Monitoring Suppl (ACCU-CHEK GUIDE) w/Device KIT 1 Units by Does not apply route as directed. 06/29/17  Yes Ria Bush, MD  Cholecalciferol (VITAMIN D3) 2000 units TABS Take 1 tablet by mouth at bedtime.   Yes [provider]  docusate sodium (COLACE) 100 MG capsule Take 100 mg by mouth at bedtime.    Yes [provider]  doxazosin (CARDURA) 8 MG tablet TAKE 1 TABLET BY MOUTH AT BEDTIME. 01/11/19  Yes Ria Bush, MD  escitalopram (LEXAPRO) 10 MG tablet TAKE 1 TABLET EVERY DAY Patient taking differently: Take 10 mg by mouth at bedtime.  04/17/19  Yes Ria Bush, MD  fluticasone [facility]) 50 MCG/ACT nasal spray Place 2 sprays into both nostrils daily. 04/23/19  Yes Ria Bush, MD  furosemide (LASIX) 40 MG tablet TAKE 1 TABLET EVERY DAY 04/12/19  Yes Ria Bush, MD  glimepiride (AMARYL) 2 MG tablet Take 1 tablet (2 mg total) by mouth daily with breakfast. 01/18/19  Yes Ria Bush, MD  glucose blood (ACCU-CHEK GUIDE) test strip Check sugars three times daily and as needed when feeling ill E11.65, insulin use 06/29/17  Yes Ria Bush, MD  Insulin Pen Needle 31G X 5 MM MISC USE TO INJECT INSULIN DAILY 07/30/18  Yes Ria Bush, MD  LANCETS ULTRA THIN 30G MISC Check sugars three times  daily and as needed when feeling ill E11.65, insulin use 06/29/17  Yes Ria Bush, MD  LANTUS SOLOSTAR 100 UNIT/ML Solostar Pen INJECT 15 UNITS INTO THE SKIN DAILY AT 10 PM Patient taking differently: Inject 10 Units into the skin at bedtime.  05/30/19  Yes Ria Bush, MD  loratadine (CLARITIN) 10 MG tablet Take 10 mg by mouth daily.   Yes [provider]  losartan (COZAAR) 50 MG tablet Take 1 tablet (50 mg total) by mouth daily. 05/13/19  Yes Ria Bush, MD  lovastatin (MEVACOR) 20 MG tablet Take 1 tablet (20 mg total) by mouth once a week. 06/19/19  Yes Ria Bush, MD  Multiple Vitamins-Minerals ([employer] SPECTRAVITE PO) Take 1 tablet by mouth at bedtime.   Yes [provider]  Omega-3 Fatty Acids (FISH OIL PO) Take 1 tablet by mouth at bedtime.   Yes  [provider]  omeprazole (PRILOSEC) 40 MG capsule TAKE 1 CAPSULE EVERY DAY Patient taking differently: Take 40 mg by mouth daily.  05/25/19  Yes Ria Bush, MD  tacrolimus (PROGRAF) 1 MG capsule Take 1-2 mg by mouth See admin instructions. Take two tablets by mouth in the morning, then take 1 tablet by mouth in the evening   Yes [provider]  oxyCODONE (ROXICODONE) 5 MG immediate release tablet Take 1-2 tablets (5-10 mg total) by mouth every 4 (four) hours as needed for severe pain. Patient not taking: Reported on 02/04/9146 82/95/62   Delora Fuel, MD  rizatriptan (MAXALT) 10 MG tablet Take 1 tablet (10 mg total) by mouth as needed for migraine. May repeat in 2 hours if needed Patient not taking: Reported on 07/15/2019 04/23/19   Ria Bush, MD    Allergies    Tolmetin, Ambien [zolpidem tartrate], Morphine and related, Hydrochlorothiazide w-triamterene, and Lisinopril  Review of Systems   Review of Systems All systems reviewed and negative, other than as noted in HPI.  Physical Exam Updated Vital Signs BP 139/76 (BP Location: Right Arm)   Pulse (!) 104   Temp 99.8  F (37.7 C) (Oral)   Resp 18   Ht 5' 6.5" (1.689 m)   Wt 127 kg   SpO2 97%   BMI 44.52 kg/m   Physical Exam Vitals and nursing note reviewed.  Constitutional:      General: He is not in acute distress.    Appearance: He is well-developed.     Comments: Laying in bed.  Appears tired, but not toxic.  HENT:     Head: Normocephalic and atraumatic.  Eyes:     General:        Right eye: No discharge.        Left eye: No discharge.     Conjunctiva/sclera: Conjunctivae normal.  Cardiovascular:     Rate and Rhythm: Normal rate and regular rhythm.     Heart sounds: Normal heart sounds. No murmur. No friction rub. No gallop.   Pulmonary:     Effort: Pulmonary effort is normal. No respiratory distress.     Breath sounds: Normal breath sounds.  Abdominal:     General: There is no distension.     Palpations: Abdomen is soft.     Tenderness: There is no abdominal tenderness.  Musculoskeletal:        General: No tenderness.     Cervical back: Neck supple.  Skin:    General: Skin is warm and dry.  Neurological:     Mental Status: He is alert.  Psychiatric:        Behavior: Behavior normal.        Thought Content: Thought content normal.     ED Results / Procedures / Treatments   Labs (all labs ordered are listed, but only abnormal results are displayed) Labs Reviewed  RESPIRATORY PANEL BY RT PCR (FLU A&B, COVID) - Abnormal; Notable for the following components:      Result Value   SARS Coronavirus 2 by RT PCR POSITIVE (*)    All other components within normal limits  COMPREHENSIVE METABOLIC PANEL - Abnormal; Notable for the following components:   Glucose, Bld 206 (*)    BUN 24 (*)    Creatinine, Ser 1.84 (*)    ALT 45 (*)    GFR calc non Af Amer 41 (*)    GFR calc Af Amer 48 (*)    All other components within normal  limits  CBC WITH DIFFERENTIAL/PLATELET - Abnormal; Notable for the following components:   Platelets 121 (*)    All other components within normal limits   URINALYSIS, ROUTINE W REFLEX MICROSCOPIC - Abnormal; Notable for the following components:   Color, Urine AMBER (*)    APPearance HAZY (*)    Protein, ur 30 (*)    Bacteria, UA RARE (*)    All other components within normal limits  LACTIC ACID, PLASMA  PROTIME-INR    EKG EKG Interpretation  Date/Time:  Monday July 15 2019 12:04:11 EST Ventricular Rate:  95 PR Interval:  158 QRS Duration: 76 QT Interval:  324 QTC Calculation: 407 R Axis:   64 Text Interpretation: Normal sinus rhythm Normal ECG Confirmed by Virgel Manifold [phone]) on 07/15/2019 4:03:40 PM   Radiology DG Chest 2 View  Result Date: 07/15/2019 CLINICAL DATA:  Generalized body aches. EXAM: CHEST - 2 VIEW COMPARISON:  June 10, 2016. FINDINGS: The heart size and mediastinal contours are within normal limits. Both lungs are clear. The visualized skeletal structures are unremarkable. IMPRESSION: No active cardiopulmonary disease. Electronically Signed   By: Marijo Conception M.D.   On: 07/15/2019 12:57    Procedures Procedures (including critical care time)  Medications Ordered in ED Medications - No data to display  ED Course  I have reviewed the triage vital signs and the nursing notes.  Pertinent labs & imaging results that were available during my care of the patient were reviewed by me and considered in my medical decision making (see chart for details).    MDM Rules/Calculators/A&P  54 year old male with generalized body aches and anorexia.  I suspect he probably has Covid.  History of liver transplant.  He does not appear toxic though.  Afebrile.  Hemodynamically stable.  Abdomen benign.  Renal function noted.  He is advised to push fluids.  I want him to hold his furosemide for a few days.  Return precautions were discussed.  Outpatient follow-up otherwise.  He is to continue to quarantine until Covid results come back.  Colton Porter was evaluated in Emergency Department on 07/16/2019 for the  symptoms described in the history of present illness. He was evaluated in the context of the global COVID-19 pandemic, which necessitated consideration that the patient might be at risk for infection with the SARS-CoV-2 virus that causes COVID-19. Institutional protocols and algorithms that pertain to the evaluation of patients at risk for COVID-19 are in a state of rapid change based on information released by regulatory bodies including the CDC and federal and state organizations. These policies and algorithms were followed during the patient's care in the ED.  Final Clinical Impression(s) / ED Diagnoses Final diagnoses:  Other fatigue  Body aches    Rx / DC Orders ED Discharge Orders    None       Virgel Manifold, MD 07/16/19 1622

## 2019-07-17 ENCOUNTER — Emergency Department (HOSPITAL_COMMUNITY): Payer: Medicare HMO

## 2019-07-17 ENCOUNTER — Other Ambulatory Visit: Payer: Self-pay

## 2019-07-17 ENCOUNTER — Emergency Department (HOSPITAL_COMMUNITY)
Admission: EM | Admit: 2019-07-17 | Discharge: 2019-07-17 | Disposition: A | Discharge status: Home / Self Care | Payer: Medicare HMO | Source: Home / Self Care | Attending: Emergency Medicine | Admitting: Emergency Medicine

## 2019-07-17 ENCOUNTER — Telehealth: Payer: Self-pay

## 2019-07-17 DIAGNOSIS — E1140 Type 2 diabetes mellitus with diabetic neuropathy, unspecified: Secondary | ICD-10-CM | POA: Insufficient documentation

## 2019-07-17 DIAGNOSIS — R05 Cough: Secondary | ICD-10-CM | POA: Insufficient documentation

## 2019-07-17 DIAGNOSIS — R42 Dizziness and giddiness: Secondary | ICD-10-CM | POA: Insufficient documentation

## 2019-07-17 DIAGNOSIS — S2232XA Fracture of one rib, left side, initial encounter for closed fracture: Secondary | ICD-10-CM

## 2019-07-17 DIAGNOSIS — I5032 Chronic diastolic (congestive) heart failure: Secondary | ICD-10-CM | POA: Insufficient documentation

## 2019-07-17 DIAGNOSIS — E1122 Type 2 diabetes mellitus with diabetic chronic kidney disease: Secondary | ICD-10-CM | POA: Insufficient documentation

## 2019-07-17 DIAGNOSIS — M549 Dorsalgia, unspecified: Secondary | ICD-10-CM

## 2019-07-17 DIAGNOSIS — Y998 Other external cause status: Secondary | ICD-10-CM | POA: Insufficient documentation

## 2019-07-17 DIAGNOSIS — Z940 Kidney transplant status: Secondary | ICD-10-CM | POA: Insufficient documentation

## 2019-07-17 DIAGNOSIS — I130 Hypertensive heart and chronic kidney disease with heart failure and stage 1 through stage 4 chronic kidney disease, or unspecified chronic kidney disease: Secondary | ICD-10-CM | POA: Insufficient documentation

## 2019-07-17 DIAGNOSIS — Y929 Unspecified place or not applicable: Secondary | ICD-10-CM | POA: Insufficient documentation

## 2019-07-17 DIAGNOSIS — Z87891 Personal history of nicotine dependence: Secondary | ICD-10-CM | POA: Insufficient documentation

## 2019-07-17 DIAGNOSIS — Z79899 Other long term (current) drug therapy: Secondary | ICD-10-CM | POA: Insufficient documentation

## 2019-07-17 DIAGNOSIS — W19XXXA Unspecified fall, initial encounter: Secondary | ICD-10-CM | POA: Insufficient documentation

## 2019-07-17 DIAGNOSIS — R55 Syncope and collapse: Secondary | ICD-10-CM | POA: Insufficient documentation

## 2019-07-17 DIAGNOSIS — R5383 Other fatigue: Secondary | ICD-10-CM | POA: Insufficient documentation

## 2019-07-17 DIAGNOSIS — U071 COVID-19: Secondary | ICD-10-CM

## 2019-07-17 DIAGNOSIS — Z7982 Long term (current) use of aspirin: Secondary | ICD-10-CM | POA: Insufficient documentation

## 2019-07-17 DIAGNOSIS — R531 Weakness: Secondary | ICD-10-CM | POA: Insufficient documentation

## 2019-07-17 DIAGNOSIS — Y9389 Activity, other specified: Secondary | ICD-10-CM | POA: Insufficient documentation

## 2019-07-17 DIAGNOSIS — Z794 Long term (current) use of insulin: Secondary | ICD-10-CM | POA: Insufficient documentation

## 2019-07-17 DIAGNOSIS — N183 Chronic kidney disease, stage 3 unspecified: Secondary | ICD-10-CM | POA: Insufficient documentation

## 2019-07-17 LAB — COMPREHENSIVE METABOLIC PANEL
ALT: 43 U/L (ref 0–44)
AST: 42 U/L — ABNORMAL HIGH (ref 15–41)
Albumin: 3.3 g/dL — ABNORMAL LOW (ref 3.5–5.0)
Alkaline Phosphatase: 62 U/L (ref 38–126)
Anion gap: 14 (ref 5–15)
BUN: 23 mg/dL — ABNORMAL HIGH (ref 6–20)
CO2: 19 mmol/L — ABNORMAL LOW (ref 22–32)
Calcium: 8.6 mg/dL — ABNORMAL LOW (ref 8.9–10.3)
Chloride: 99 mmol/L (ref 98–111)
Creatinine, Ser: 1.72 mg/dL — ABNORMAL HIGH (ref 0.61–1.24)
GFR calc Af Amer: 52 mL/min — ABNORMAL LOW (ref >60)
GFR calc non Af Amer: 45 mL/min — ABNORMAL LOW (ref >60)
Glucose, Bld: 188 mg/dL — ABNORMAL HIGH (ref 70–99)
Potassium: 3.9 mmol/L (ref 3.5–5.1)
Sodium: 132 mmol/L — ABNORMAL LOW (ref 135–145)
Total Bilirubin: 1 mg/dL (ref 0.3–1.2)
Total Protein: 7.1 g/dL (ref 6.5–8.1)

## 2019-07-17 LAB — CBC WITH DIFFERENTIAL/PLATELET
Abs Immature Granulocytes: 0.01 10*3/uL (ref 0.00–0.07)
Basophils Absolute: 0 10*3/uL (ref 0.0–0.1)
Basophils Relative: 0 %
Eosinophils Absolute: 0 10*3/uL (ref 0.0–0.5)
Eosinophils Relative: 0 %
HCT: 38.6 % — ABNORMAL LOW (ref 39.0–52.0)
Hemoglobin: 12.9 g/dL — ABNORMAL LOW (ref 13.0–17.0)
Immature Granulocytes: 0 %
Lymphocytes Relative: 20 %
Lymphs Abs: 0.6 10*3/uL — ABNORMAL LOW (ref 0.7–4.0)
MCH: 28.5 pg (ref 26.0–34.0)
MCHC: 33.4 g/dL (ref 30.0–36.0)
MCV: 85.2 fL (ref 80.0–100.0)
Monocytes Absolute: 0.2 10*3/uL (ref 0.1–1.0)
Monocytes Relative: 8 %
Neutro Abs: 2 10*3/uL (ref 1.7–7.7)
Neutrophils Relative %: 72 %
Platelets: 94 10*3/uL — ABNORMAL LOW (ref 150–400)
RBC: 4.53 MIL/uL (ref 4.22–5.81)
RDW: 13.8 % (ref 11.5–15.5)
WBC: 2.9 10*3/uL — ABNORMAL LOW (ref 4.0–10.5)
nRBC: 0 % (ref 0.0–0.2)

## 2019-07-17 MED ORDER — OXYCODONE-ACETAMINOPHEN 5-325 MG PO TABS: 1.0000 | ORAL_TABLET | ORAL | 0 refills | Status: DC | PRN

## 2019-07-17 MED ORDER — OXYCODONE-ACETAMINOPHEN 5-325 MG PO TABS
1.0000 | ORAL_TABLET | Freq: Once | ORAL | Status: AC
  Administered 2019-07-17: 16:00:00 1 via ORAL
  Filled 2019-07-17: qty 1

## 2019-07-17 MED ORDER — FENTANYL CITRATE (PF) 100 MCG/2ML IJ SOLN
50.0000 ug | Freq: Once | INTRAMUSCULAR | Status: AC
  Administered 2019-07-17: 12:00:00 50 ug via INTRAVENOUS
  Filled 2019-07-17: qty 2

## 2019-07-17 MED ORDER — FENTANYL CITRATE (PF) 100 MCG/2ML IJ SOLN
50.0000 ug | Freq: Once | INTRAMUSCULAR | Status: AC
  Administered 2019-07-17: 50 ug via INTRAVENOUS
  Filled 2019-07-17: qty 2

## 2019-07-17 NOTE — Discharge Instructions (Addendum)
Please call your primary doctor to schedule close virtual visit.  Please go to your previously scheduled antibody infusion.  Please take pain medicine as prescribed.  Note this can make you drowsy and should not be taken while driving bring him machinery.  Please follow isolation precautions as discussed.  If you have any worsening in your breathing, any episode of passing out, please return immediately to the emergency department for reassessment.

## 2019-07-17 NOTE — Telephone Encounter (Signed)
pts wife DPR signed said that pt was in ED on 07/15/19 and tested + for covid. Pt did not have good night last night;pt had dry cough consistently, T at 1:30 AM was 100.5. pt got up  Was dizzy and pt lost consciousness briefly and fell; today pt has soreness and pain in lower rt back from the fall. Pt has constant H/A. Pt is not drinking a lot and only ate small amt of mashed potatoes last night. Pt has dry mouth,dizzy, pt not sure what color urine is. Pt not voiding a normal qty of urine; amt of urine is much smaller than usual. Pt has had diarrhea since 07/14/19.pt is weak and fatigued, SOB upon exertion and nauseated but not vomiting. Mrs Oiler said because + covid and possible dehydration and pt is high risk due to pt had liver transplant. Pt is scheduled for IV transfusion (BAM) at [facility] on 07/18/19. Mrs Proffer said was told this is a trial thing for people at high risk with covid. Pt and Mrs Gravett want to know if this infusion is safe and should pt have the BAM IV infusion or not. Mrs Alworth request cb about infusion. Due to pt continuing with fever, lost consciousness last night, having back pain from fall and possible dehydration symptoms and with pt feeling really bad and SOB upon any exertion pt is going back to [facility] ED for eval. pts wife request that I call [facility] to let them know pt is coming via car and should be at ED 11:00-11:15 AM this morning. I spoke with [facility] triage nurse at [facility] ED and gave above info and she voiced understanding. Dr [name] out of office until 07/19/19; will send note to Dr [name] as FYI(Dr Damita Dunnings out of office today) and to Dr Silvio Pate who is in the office this morning for review and possible answer to [facility] IV infusion.

## 2019-07-17 NOTE — Telephone Encounter (Signed)
I agree with the ER evaluation given his symptoms.  Let them know that the studies on the [facility] are quite positive--so I would recommend he get that if possible.

## 2019-07-17 NOTE — ED Provider Notes (Signed)
[facility] EMERGENCY DEPARTMENT Provider Note   CSN: [id number] Arrival date & time: 07/17/19  1105     History Chief Complaint  Patient presents with  . covid + symptoms  . Fall  . Liver Transplant    Colton Porter is a 54 y.o. male.  Presents to the ER with ongoing complaints of Covid symptoms as well as fall, syncopal episode.  Patient has had symptoms since last Friday, primary symptoms have been generalized weakness, fatigue, nausea.  Some shortness of breath with dry cough.  No change in shortness of breath today.  Feels dehydrated, poor appetite but has been tolerating water.  Has had some body aches as well.  Dry heaves but no vomiting.  No abdominal pain.  Last night got up and felt lightheaded, syncopized and fell to the floor.  He denies hitting his head.  Landed on his left lower back.  Has been having constant pain since that time.  Has been able to ambulate since.  Liver transplant in 2017  HPI     Past Medical History:  Diagnosis Date  . Alcohol dependence ([place]) 07/11/2012  . Alcoholic cirrhosis of liver with ascites ([place]) 07/2014   s./p transplant 12/2015  . Allergy   . Anemia   . Cellulitis of left leg   . Chronic diastolic heart failure ([place]) 11/01/2013  . CKD (chronic kidney disease) stage 3, GFR 30-59 ml/min 11/04/2015  . Diabetes mellitus without complication ([place])   . GERD (gastroesophageal reflux disease)   . Hyperlipidemia   . Hypertension   . Neuropathy   . OSA on CPAP 07/11/2012   HST 07/2013:  AHI 39/hr.    . Thrombocytopenia ([place]) 12/15/2011    Patient Active Problem List   Diagnosis Date Noted  . [place]obe lesion, left 02/05/2018  . Chronic heel pain, left 10/14/2017  . Anejaculation 08/09/2017  . Chronic gout 05/22/2017  . Foot swelling 04/12/2017  . Disorder of ejaculation 04/06/2017  . Dyspnea 11/18/2016  . BPH (benign prostatic hyperplasia) 10/05/2016  . Status post liver transplant ([place]) 03/15/2016  . Portal  hypertensive gastropathy ([place]) 12/04/2015  . Type 2 diabetes mellitus with other specified complication ([place]) 61/44/3154  . Gynecomastia   . CKD (chronic kidney disease) stage 3, GFR 30-59 ml/min (H[facility]) 11/04/2015  . Depression, major, single episode, moderate ([place]) 07/09/2015  . Headache 02/19/2015  . Insomnia 12/06/2014  . Hypomagnesemia   . Other fatigue 11/18/2014  . Internal hemorrhoid 10/01/2014  . Erectile dysfunction 10/01/2014  . Alcoholic cirrhosis of liver without ascites ([place]) 07/28/2014  . (HFpEF) heart failure with preserved ejection fraction ([place]) 11/01/2013  . Essential hypertension 06/04/2013  . OSA on CPAP 07/11/2012  . History of alcohol dependence ([place]) 07/11/2012  . Thrombocytopenia ([place]) 12/15/2011  . Morbid obesity with BMI of 50.0-59.9, adult ([place]) 11/16/2011  . Routine general medical examination at a health care facility 09/22/2010  . NEUROPATHY 05/27/2009  . Pedal edema 09/22/2008  . Dyslipidemia 02/15/2007  . Allergic rhinitis 02/15/2007  . GERD 02/15/2007    Past Surgical History:  Procedure Laterality Date  . COLONOSCOPY  08/2013   hyperplastic polyps, hemorrhoids Ardis Hughs)  . 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 FINGER AMPUTATION  1997   left 4th finger - radial arm saw  . LEFT AND RIGHT HEART CATHETERIZATION WITH CORONARY ANGIOGRAM N/A 06/10/2013   Procedure: LEFT AND RIGHT HEART CATHETERIZATION WITH CORONARY ANGIOGRAM;  Surgeon: Blane Ohara, MD;  Location: [facility]  CATH LAB;  Service: Cardiovascular;  Laterality: N/A;  . LIVER TRANSPLANTATION  50/0938   alcoholic cirrhosis ([facility] at [facility])       Family History  Problem Relation Age of Onset  . Stroke Mother   . Emphysema Mother   . Hypertension Father   . Heart disease Father   . Emphysema Father   . Lung cancer Maternal Aunt   . Stroke Paternal Grandmother   . Heart attack Neg Hx     Social History   Tobacco Use  . Smoking status:  Never Smoker  . Smokeless tobacco: Former Systems developer    Types: Chew  Substance Use Topics  . Alcohol use: No    Alcohol/week: 0.0 standard drinks    Comment: 4-6 drinks daily - NO ETOH SINCE APRIL 2016  . Drug use: Yes    Comment: remote use of marijuana in the past, has since quit    Home Medications Prior to Admission medications   Medication Sig Start Date End Date Taking? Authorizing Provider  acetaminophen (TYLENOL) 500 MG tablet Take 500 mg by mouth every 6 (six) hours as needed for mild pain.    [provider]  allopurinol (ZYLOPRIM) 300 MG tablet Take 3 tablets by mouth daily.  09/18/18   [provider]  amLODipine (NORVASC) 10 MG tablet TAKE 1 TABLET EVERY DAY 03/14/19   Ria Bush, MD  ASPIR-LOW 81 MG EC tablet Take 1 tablet (81 mg total) by mouth daily. 04/06/17   Ria Bush, MD  Blood Glucose Monitoring Suppl (ACCU-CHEK GUIDE) w/Device KIT 1 Units by Does not apply route as directed. 06/29/17   Ria Bush, MD  Cholecalciferol (VITAMIN D3) 2000 units TABS Take 1 tablet by mouth at bedtime.    [provider]  docusate sodium (COLACE) 100 MG capsule Take 100 mg by mouth at bedtime.     [provider]  doxazosin (CARDURA) 8 MG tablet TAKE 1 TABLET BY MOUTH AT BEDTIME. 01/11/19   Ria Bush, MD  escitalopram (LEXAPRO) 10 MG tablet TAKE 1 TABLET EVERY DAY Patient taking differently: Take 10 mg by mouth at bedtime.  04/17/19   Ria Bush, MD  fluticasone [facility]) 50 MCG/ACT nasal spray Place 2 sprays into both nostrils daily. 04/23/19   Ria Bush, MD  furosemide (LASIX) 40 MG tablet TAKE 1 TABLET EVERY DAY 04/12/19   Ria Bush, MD  glimepiride (AMARYL) 2 MG tablet Take 1 tablet (2 mg total) by mouth daily with breakfast. 01/18/19   Ria Bush, MD  glucose blood (ACCU-CHEK GUIDE) test strip Check sugars three times daily and as needed when feeling ill E11.65, insulin use 06/29/17   Ria Bush,  MD  Insulin Pen Needle 31G X 5 MM MISC USE TO INJECT INSULIN DAILY 07/30/18   Ria Bush, MD  LANCETS ULTRA THIN 30G MISC Check sugars three times daily and as needed when feeling ill E11.65, insulin use 06/29/17   Ria Bush, MD  LANTUS SOLOSTAR 100 UNIT/ML Solostar Pen INJECT 15 UNITS INTO THE SKIN DAILY AT 10 PM Patient taking differently: Inject 10 Units into the skin at bedtime.  05/30/19   Ria Bush, MD  loratadine (CLARITIN) 10 MG tablet Take 10 mg by mouth daily.    [provider]  losartan (COZAAR) 50 MG tablet Take 1 tablet (50 mg total) by mouth daily. 05/13/19   Ria Bush, MD  lovastatin (MEVACOR) 20 MG tablet Take 1 tablet (20 mg total) by mouth once a week. 06/19/19   [name],  Garlon Hatchet, MD  Multiple Vitamins-Minerals ([employer] SPECTRAVITE PO) Take 1 tablet by mouth at bedtime.    [provider]  Omega-3 Fatty Acids (FISH OIL PO) Take 1 tablet by mouth at bedtime.    [provider]  omeprazole (PRILOSEC) 40 MG capsule TAKE 1 CAPSULE EVERY DAY Patient taking differently: Take 40 mg by mouth daily.  05/25/19   Ria Bush, MD  oxyCODONE (ROXICODONE) 5 MG immediate release tablet Take 1-2 tablets (5-10 mg total) by mouth every 4 (four) hours as needed for severe pain. Patient not taking: Reported on 0/17/7939 03/00/92   Delora Fuel, MD  oxyCODONE-acetaminophen (PERCOCET) 5-325 MG tablet Take 1 tablet by mouth every 4 (four) hours as needed for up to 3 days for severe pain. 07/17/19 07/20/19  Lucrezia Starch, MD  rizatriptan (MAXALT) 10 MG tablet Take 1 tablet (10 mg total) by mouth as needed for migraine. May repeat in 2 hours if needed Patient not taking: Reported on 07/15/2019 04/23/19   Ria Bush, MD  tacrolimus (PROGRAF) 1 MG capsule Take 1-2 mg by mouth See admin instructions. Take two tablets by mouth in the morning, then take 1 tablet by mouth in the evening    [provider]    Allergies    Tolmetin,  Ambien [zolpidem tartrate], Morphine and related, Hydrochlorothiazide w-triamterene, and Lisinopril  Review of Systems   Review of Systems  Constitutional: Positive for chills, fatigue and fever.  HENT: Negative for ear pain and sore throat.   Eyes: Negative for pain and visual disturbance.  Respiratory: Negative for cough and shortness of breath.   Cardiovascular: Negative for chest pain and palpitations.  Gastrointestinal: Positive for abdominal pain. Negative for vomiting.  Genitourinary: Negative for dysuria and hematuria.  Musculoskeletal: Positive for back pain. Negative for arthralgias.  Skin: Negative for color change and rash.  Neurological: Negative for seizures and syncope.  All other systems reviewed and are negative.   Physical Exam Updated Vital Signs BP 102/63 (BP Location: Right Arm)   Pulse 76   Temp 99 F (37.2 C) (Oral)   Resp 16   SpO2 97%   Physical Exam Vitals and nursing note reviewed.  Constitutional:      Appearance: He is well-developed.  HENT:     Head: Normocephalic and atraumatic.  Eyes:     Conjunctiva/sclera: Conjunctivae normal.  Cardiovascular:     Rate and Rhythm: Normal rate and regular rhythm.     Heart sounds: No murmur.  Pulmonary:     Effort: Pulmonary effort is normal. No respiratory distress.     Breath sounds: Normal breath sounds.  Abdominal:     Palpations: Abdomen is soft.     Tenderness: There is no abdominal tenderness.     Comments: TTP left flank  Musculoskeletal:        General: Normal range of motion.     Cervical back: Neck supple.     Comments: MSK: TTP over L spine, left lumbar region, left flank No TTP throughout all four extremities  Skin:    General: Skin is warm and dry.     Capillary Refill: Capillary refill takes less than 2 seconds.  Neurological:     General: No focal deficit present.     Mental Status: He is alert and oriented to person, place, and time.     ED Results / Procedures / Treatments    Labs (all labs ordered are listed, but only abnormal results are displayed) Labs Reviewed  COMPREHENSIVE METABOLIC  PANEL - Abnormal; Notable for the following components:      Result Value   Sodium 132 (*)    CO2 19 (*)    Glucose, Bld 188 (*)    BUN 23 (*)    Creatinine, Ser 1.72 (*)    Calcium 8.6 (*)    Albumin 3.3 (*)    AST 42 (*)    GFR calc non Af Amer 45 (*)    GFR calc Af Amer 52 (*)    All other components within normal limits  CBC WITH DIFFERENTIAL/PLATELET - Abnormal; Notable for the following components:   WBC 2.9 (*)    Hemoglobin 12.9 (*)    HCT 38.6 (*)    Platelets 94 (*)    Lymphs Abs 0.6 (*)    All other components within normal limits  CBC WITH DIFFERENTIAL/PLATELET  URINALYSIS, ROUTINE W REFLEX MICROSCOPIC    EKG EKG Interpretation  Date/Time:  Wednesday July 17 2019 11:25:23 EST Ventricular Rate:  83 PR Interval:    QRS Duration: 90 QT Interval:  341 QTC Calculation: 401 R Axis:   61 Text Interpretation: Sinus rhythm Confirmed by Madalyn Rob [phone]) on 07/17/2019 11:43:14 AM   Radiology CT ABDOMEN PELVIS WO CONTRAST  Result Date: 07/17/2019 CLINICAL DATA:  Recent fall with left flank pain, initial encounter EXAM: CT ABDOMEN AND PELVIS WITHOUT CONTRAST TECHNIQUE: Multidetector CT imaging of the abdomen and pelvis was performed following the standard protocol without IV contrast. COMPARISON:  None. FINDINGS: Lower chest: Lung bases demonstrate patchy ground-glass opacities particularly in the right lower lobe consistent with the given clinical history of COVID-19 positivity. Hepatobiliary: Fatty infiltration of the liver is noted. The gallbladder has been surgically removed. Pancreas: Unremarkable. No pancreatic ductal dilatation or surrounding inflammatory changes. Spleen: Normal in size without focal abnormality. Adrenals/Urinary Tract: Adrenal glands are within normal limits. Kidneys are well visualized bilaterally. No renal calculi or  urinary tract obstructive changes are seen. The bladder is decompressed. Stomach/Bowel: Colon is well visualized and decompressed. The appendix is within normal limits. No small bowel abnormality is noted. The stomach is unremarkable. Vascular/Lymphatic: Aortic atherosclerosis. No enlarged abdominal or pelvic lymph nodes. Reproductive: Prostate is unremarkable. Other: No free fluid is noted. Some fat containing anterior abdominal wall hernias are seen to the right of the midline in area of previous surgery. Musculoskeletal: Mildly displaced fracture of the left eleventh rib is noted centrally. No other definitive rib fractures are seen. No compression deformities are noted. Degenerative changes of the lower thoracic spine are seen. IMPRESSION: Eleventh rib fracture posteriorly near the costovertebral junction without complicating factors. Patchy ground-glass opacities particularly in the right lower lobe consistent with the given clinical history of COVID-19 positivity. No other focal abnormality is seen. Electronically Signed   By: Inez Catalina M.D.   On: 07/17/2019 13:52   CT L-SPINE NO CHARGE  Result Date: 07/17/2019 CLINICAL DATA:  Left flank pain.  History of liver transplant. EXAM: CT LUMBAR SPINE WITHOUT CONTRAST TECHNIQUE: Multidetector CT imaging of the lumbar spine was performed without intravenous contrast administration. Multiplanar CT image reconstructions were also generated. COMPARISON:  None. FINDINGS: Segmentation: Normal Alignment: Normal Vertebrae: Negative for lumbar fracture or mass. Paraspinal and other soft tissues: Negative for paraspinous mass or adenopathy. Disc levels: L1-2: Mild degenerative change.  Negative for stenosis L2-3: Mild disc and mild facet degeneration.  Negative for stenosis L3-4: Mild disc and mild facet degeneration.  Negative for stenosis L4-5: Mild disc bulging and mild facet degeneration. Negative for  stenosis L5-S1: Negative IMPRESSION: Mild lumbar degenerative  change. No acute abnormality. Negative for neural impingement or stenosis. Electronically Signed   By: Franchot Gallo M.D.   On: 07/17/2019 13:51   DG Chest Portable 1 View  Result Date: 07/17/2019 CLINICAL DATA:  Fever and shortness of breath.  COVID positive. EXAM: PORTABLE CHEST 1 VIEW COMPARISON:  Chest x-ray dated July 15, 2019. FINDINGS: The heart size and mediastinal contours are within normal limits. Normal pulmonary vascularity. Slightly increased markings at the right lung base. No pleural effusion or pneumothorax. No acute osseous abnormality. IMPRESSION: Slightly increased markings at the right lung base may reflect pneumonia. Electronically Signed   By: Titus Dubin M.D.   On: 07/17/2019 12:22    Procedures Procedures (including critical care time)  Medications Ordered in ED Medications  fentaNYL (SUBLIMAZE) injection 50 mcg (50 mcg Intravenous Given 07/17/19 1148)  fentaNYL (SUBLIMAZE) injection 50 mcg (50 mcg Intravenous Given 07/17/19 1510)  oxyCODONE-acetaminophen (PERCOCET/ROXICET) 5-325 MG per tablet 1 tablet (1 tablet Oral Given 07/17/19 1547)    ED Course  I have reviewed the triage vital signs and the nursing notes.  Pertinent labs & imaging results that were available during my care of the patient were reviewed by me and considered in my medical decision making (see chart for details).    MDM Rules/Calculators/A&P                      54 year old male history of liver transplant on Prograf presents to ER after syncopal episode, flank pain and shortness of breath in setting of known COVID-19.  Here patient was overall well-appearing, stable vital signs.  I did note some focal tenderness on his left flank.  Given reported fall, obtain trauma work-up, concerning for single rib fracture, nondisplaced, no other complications.  Regarding his COVID-19, CXR with some subtle infiltrates consistent with COVID-19 pneumonia.  Despite the rib fracture and Covid, patient  remained well-appearing, he did well on ambulation trial, no desaturations.  Provided incentive spirometer.  Patient already has outpatient antibiotic infusion set up for tomorrow.  Recommend close virtual recheck with primary doctor.  At this time I believe he is appropriate for discharge and outpatient management.  Emphasized need to return though for any worsening symptoms.     After the discussed management above, the patient was determined to be safe for discharge.  The patient was in agreement with this plan and all questions regarding their care were answered.  ED return precautions were discussed and the patient will return to the ED with any significant worsening of condition.   CANNAN BEECK was evaluated in Emergency Department on 07/17/2019 for the symptoms described in the history of present illness. He was evaluated in the context of the global COVID-19 pandemic, which necessitated consideration that the patient might be at risk for infection with the SARS-CoV-2 virus that causes COVID-19. Institutional protocols and algorithms that pertain to the evaluation of patients at risk for COVID-19 are in a state of rapid change based on information released by regulatory bodies including the CDC and federal and state organizations. These policies and algorithms were followed during the patient's care in the ED.   Final Clinical Impression(s) / ED Diagnoses Final diagnoses:  Back pain  COVID-19  Closed fracture of one rib of left side, initial encounter    Rx / DC Orders ED Discharge Orders         Ordered    oxyCODONE-acetaminophen (PERCOCET) 5-325 MG  tablet  Every 4 hours PRN     07/17/19 1539           Lucrezia Starch, MD 07/17/19 1600

## 2019-07-17 NOTE — ED Notes (Signed)
Pt ambulated in room while on pulse ox. Pt became SOB but maintained SpO2 between 93-95% RA

## 2019-07-17 NOTE — Telephone Encounter (Signed)
pts wife notified as instructed and voiced understanding. Mrs Domenick is in car outside ED waiting on update on pt who is in the back part of the ED now.

## 2019-07-18 ENCOUNTER — Inpatient Hospital Stay (HOSPITAL_COMMUNITY)
Admission: EM | Admit: 2019-07-18 | Discharge: 2019-07-23 | DRG: 177 | Disposition: A | Discharge status: Home / Self Care | Payer: Medicare HMO | Attending: Internal Medicine | Admitting: Internal Medicine

## 2019-07-18 ENCOUNTER — Encounter (HOSPITAL_COMMUNITY): Payer: Self-pay | Admitting: Emergency Medicine

## 2019-07-18 ENCOUNTER — Emergency Department (HOSPITAL_COMMUNITY): Payer: Medicare HMO

## 2019-07-18 ENCOUNTER — Ambulatory Visit (HOSPITAL_COMMUNITY): Payer: Medicare HMO

## 2019-07-18 DIAGNOSIS — Z801 Family history of malignant neoplasm of trachea, bronchus and lung: Secondary | ICD-10-CM | POA: Diagnosis not present

## 2019-07-18 DIAGNOSIS — E785 Hyperlipidemia, unspecified: Secondary | ICD-10-CM | POA: Diagnosis present

## 2019-07-18 DIAGNOSIS — S2232XA Fracture of one rib, left side, initial encounter for closed fracture: Secondary | ICD-10-CM | POA: Diagnosis present

## 2019-07-18 DIAGNOSIS — D84821 Immunodeficiency due to drugs: Secondary | ICD-10-CM | POA: Diagnosis present

## 2019-07-18 DIAGNOSIS — G4733 Obstructive sleep apnea (adult) (pediatric): Secondary | ICD-10-CM | POA: Diagnosis present

## 2019-07-18 DIAGNOSIS — W1830XA Fall on same level, unspecified, initial encounter: Secondary | ICD-10-CM | POA: Diagnosis present

## 2019-07-18 DIAGNOSIS — J9601 Acute respiratory failure with hypoxia: Secondary | ICD-10-CM | POA: Diagnosis present

## 2019-07-18 DIAGNOSIS — I130 Hypertensive heart and chronic kidney disease with heart failure and stage 1 through stage 4 chronic kidney disease, or unspecified chronic kidney disease: Secondary | ICD-10-CM | POA: Diagnosis present

## 2019-07-18 DIAGNOSIS — E1122 Type 2 diabetes mellitus with diabetic chronic kidney disease: Secondary | ICD-10-CM | POA: Diagnosis present

## 2019-07-18 DIAGNOSIS — Z89022 Acquired absence of left finger(s): Secondary | ICD-10-CM | POA: Diagnosis not present

## 2019-07-18 DIAGNOSIS — Z823 Family history of stroke: Secondary | ICD-10-CM | POA: Diagnosis not present

## 2019-07-18 DIAGNOSIS — Z885 Allergy status to narcotic agent status: Secondary | ICD-10-CM

## 2019-07-18 DIAGNOSIS — J1282 Pneumonia due to coronavirus disease 2019: Secondary | ICD-10-CM | POA: Diagnosis present

## 2019-07-18 DIAGNOSIS — Z944 Liver transplant status: Secondary | ICD-10-CM | POA: Diagnosis not present

## 2019-07-18 DIAGNOSIS — N400 Enlarged prostate without lower urinary tract symptoms: Secondary | ICD-10-CM | POA: Diagnosis present

## 2019-07-18 DIAGNOSIS — Z888 Allergy status to other drugs, medicaments and biological substances status: Secondary | ICD-10-CM | POA: Diagnosis not present

## 2019-07-18 DIAGNOSIS — Z825 Family history of asthma and other chronic lower respiratory diseases: Secondary | ICD-10-CM

## 2019-07-18 DIAGNOSIS — N1832 Chronic kidney disease, stage 3b: Secondary | ICD-10-CM | POA: Diagnosis present

## 2019-07-18 DIAGNOSIS — S2232XD Fracture of one rib, left side, subsequent encounter for fracture with routine healing: Secondary | ICD-10-CM

## 2019-07-18 DIAGNOSIS — I5032 Chronic diastolic (congestive) heart failure: Secondary | ICD-10-CM | POA: Diagnosis present

## 2019-07-18 DIAGNOSIS — Z79899 Other long term (current) drug therapy: Secondary | ICD-10-CM

## 2019-07-18 DIAGNOSIS — Z8249 Family history of ischemic heart disease and other diseases of the circulatory system: Secondary | ICD-10-CM

## 2019-07-18 DIAGNOSIS — U071 COVID-19: Principal | ICD-10-CM

## 2019-07-18 DIAGNOSIS — R0902 Hypoxemia: Secondary | ICD-10-CM | POA: Diagnosis not present

## 2019-07-18 DIAGNOSIS — E1140 Type 2 diabetes mellitus with diabetic neuropathy, unspecified: Secondary | ICD-10-CM | POA: Diagnosis present

## 2019-07-18 DIAGNOSIS — D61818 Other pancytopenia: Secondary | ICD-10-CM | POA: Diagnosis present

## 2019-07-18 DIAGNOSIS — Z794 Long term (current) use of insulin: Secondary | ICD-10-CM

## 2019-07-18 DIAGNOSIS — Z6841 Body Mass Index (BMI) 40.0 and over, adult: Secondary | ICD-10-CM | POA: Diagnosis not present

## 2019-07-18 DIAGNOSIS — E871 Hypo-osmolality and hyponatremia: Secondary | ICD-10-CM | POA: Diagnosis present

## 2019-07-18 DIAGNOSIS — E119 Type 2 diabetes mellitus without complications: Secondary | ICD-10-CM | POA: Diagnosis not present

## 2019-07-18 DIAGNOSIS — M1A9XX0 Chronic gout, unspecified, without tophus (tophi): Secondary | ICD-10-CM | POA: Diagnosis present

## 2019-07-18 DIAGNOSIS — K219 Gastro-esophageal reflux disease without esophagitis: Secondary | ICD-10-CM | POA: Diagnosis present

## 2019-07-18 DIAGNOSIS — Z87891 Personal history of nicotine dependence: Secondary | ICD-10-CM

## 2019-07-18 DIAGNOSIS — I10 Essential (primary) hypertension: Secondary | ICD-10-CM | POA: Diagnosis not present

## 2019-07-18 DIAGNOSIS — Z79891 Long term (current) use of opiate analgesic: Secondary | ICD-10-CM

## 2019-07-18 LAB — CBC WITH DIFFERENTIAL/PLATELET
Abs Immature Granulocytes: 0.01 10*3/uL (ref 0.00–0.07)
Basophils Absolute: 0 10*3/uL (ref 0.0–0.1)
Basophils Relative: 0 %
Eosinophils Absolute: 0 10*3/uL (ref 0.0–0.5)
Eosinophils Relative: 0 %
HCT: 36 % — ABNORMAL LOW (ref 39.0–52.0)
Hemoglobin: 11.9 g/dL — ABNORMAL LOW (ref 13.0–17.0)
Immature Granulocytes: 0 %
Lymphocytes Relative: 19 %
Lymphs Abs: 0.5 10*3/uL — ABNORMAL LOW (ref 0.7–4.0)
MCH: 28.3 pg (ref 26.0–34.0)
MCHC: 33.1 g/dL (ref 30.0–36.0)
MCV: 85.7 fL (ref 80.0–100.0)
Monocytes Absolute: 0.2 10*3/uL (ref 0.1–1.0)
Monocytes Relative: 6 %
Neutro Abs: 2.1 10*3/uL (ref 1.7–7.7)
Neutrophils Relative %: 75 %
Platelets: 93 10*3/uL — ABNORMAL LOW (ref 150–400)
RBC: 4.2 MIL/uL — ABNORMAL LOW (ref 4.22–5.81)
RDW: 13.9 % (ref 11.5–15.5)
WBC: 2.8 10*3/uL — ABNORMAL LOW (ref 4.0–10.5)
nRBC: 0 % (ref 0.0–0.2)

## 2019-07-18 LAB — HIV ANTIBODY (ROUTINE TESTING W REFLEX): HIV Screen 4th Generation wRfx: NONREACTIVE

## 2019-07-18 LAB — GLUCOSE, CAPILLARY
Glucose-Capillary: 176 mg/dL — ABNORMAL HIGH (ref 70–99)
Glucose-Capillary: 183 mg/dL — ABNORMAL HIGH (ref 70–99)

## 2019-07-18 LAB — COMPREHENSIVE METABOLIC PANEL
ALT: 43 U/L (ref 0–44)
AST: 40 U/L (ref 15–41)
Albumin: 3.2 g/dL — ABNORMAL LOW (ref 3.5–5.0)
Alkaline Phosphatase: 56 U/L (ref 38–126)
Anion gap: 12 (ref 5–15)
BUN: 29 mg/dL — ABNORMAL HIGH (ref 6–20)
CO2: 20 mmol/L — ABNORMAL LOW (ref 22–32)
Calcium: 8.1 mg/dL — ABNORMAL LOW (ref 8.9–10.3)
Chloride: 101 mmol/L (ref 98–111)
Creatinine, Ser: 1.75 mg/dL — ABNORMAL HIGH (ref 0.61–1.24)
GFR calc Af Amer: 51 mL/min — ABNORMAL LOW (ref >60)
GFR calc non Af Amer: 44 mL/min — ABNORMAL LOW (ref >60)
Glucose, Bld: 183 mg/dL — ABNORMAL HIGH (ref 70–99)
Potassium: 3.7 mmol/L (ref 3.5–5.1)
Sodium: 133 mmol/L — ABNORMAL LOW (ref 135–145)
Total Bilirubin: 0.6 mg/dL (ref 0.3–1.2)
Total Protein: 7.1 g/dL (ref 6.5–8.1)

## 2019-07-18 LAB — CBG MONITORING, ED: Glucose-Capillary: 185 mg/dL — ABNORMAL HIGH (ref 70–99)

## 2019-07-18 LAB — PROCALCITONIN: Procalcitonin: <0.1 ng/mL

## 2019-07-18 LAB — D-DIMER, QUANTITATIVE: D-Dimer, Quant: 0.9 ug/mL-FEU — ABNORMAL HIGH (ref 0.00–0.50)

## 2019-07-18 LAB — ABO/RH: ABO/RH(D): O NEG

## 2019-07-18 LAB — TRIGLYCERIDES: Triglycerides: 128 mg/dL (ref <150)

## 2019-07-18 LAB — HEMOGLOBIN A1C
Hgb A1c MFr Bld: 10.1 % — ABNORMAL HIGH (ref 4.8–5.6)
Mean Plasma Glucose: 243.17 mg/dL

## 2019-07-18 LAB — C-REACTIVE PROTEIN: CRP: 8.2 mg/dL — ABNORMAL HIGH (ref <1.0)

## 2019-07-18 LAB — LACTATE DEHYDROGENASE: LDH: 182 U/L (ref 98–192)

## 2019-07-18 LAB — FIBRINOGEN: Fibrinogen: 536 mg/dL — ABNORMAL HIGH (ref 210–475)

## 2019-07-18 LAB — TSH: TSH: 0.716 u[IU]/mL (ref 0.350–4.500)

## 2019-07-18 LAB — TROPONIN I (HIGH SENSITIVITY): Troponin I (High Sensitivity): 11 ng/L (ref <18)

## 2019-07-18 LAB — LACTIC ACID, PLASMA: Lactic Acid, Venous: 1.1 mmol/L (ref 0.5–1.9)

## 2019-07-18 LAB — FERRITIN: Ferritin: 290 ng/mL (ref 24–336)

## 2019-07-18 MED ORDER — SODIUM CHLORIDE 0.9 % IV SOLN
200.0000 mg | Freq: Once | INTRAVENOUS | Status: AC
  Administered 2019-07-18: 200 mg via INTRAVENOUS
  Filled 2019-07-18: qty 200

## 2019-07-18 MED ORDER — TACROLIMUS 1 MG PO CAPS
1.0000 mg | ORAL_CAPSULE | Freq: Every day | ORAL | Status: DC
  Administered 2019-07-19 – 2019-07-22 (×4): 1 mg via ORAL
  Filled 2019-07-18 (×6): qty 1

## 2019-07-18 MED ORDER — SODIUM CHLORIDE 0.9 % IV BOLUS
1000.0000 mL | Freq: Once | INTRAVENOUS | Status: AC
  Administered 2019-07-18: 1000 mL via INTRAVENOUS

## 2019-07-18 MED ORDER — POLYETHYLENE GLYCOL 3350 17 G PO PACK: 17.0000 g | PACK | Freq: Every day | ORAL | Status: DC | PRN

## 2019-07-18 MED ORDER — ESCITALOPRAM OXALATE 10 MG PO TABS
10.0000 mg | ORAL_TABLET | Freq: Every day | ORAL | Status: DC
  Administered 2019-07-18 – 2019-07-22 (×5): 10 mg via ORAL
  Filled 2019-07-18 (×5): qty 1

## 2019-07-18 MED ORDER — DEXAMETHASONE SODIUM PHOSPHATE 10 MG/ML IJ SOLN
6.0000 mg | INTRAMUSCULAR | Status: DC
  Administered 2019-07-18 – 2019-07-22 (×5): 6 mg via INTRAVENOUS
  Filled 2019-07-18 (×5): qty 1

## 2019-07-18 MED ORDER — PANTOPRAZOLE SODIUM 40 MG PO TBEC
80.0000 mg | DELAYED_RELEASE_TABLET | Freq: Every day | ORAL | Status: DC
  Administered 2019-07-19 – 2019-07-23 (×5): 80 mg via ORAL
  Filled 2019-07-18 (×5): qty 2

## 2019-07-18 MED ORDER — ENOXAPARIN SODIUM 60 MG/0.6ML ~~LOC~~ SOLN
60.0000 mg | SUBCUTANEOUS | Status: DC
  Administered 2019-07-18: 60 mg via SUBCUTANEOUS
  Filled 2019-07-18 (×2): qty 0.6

## 2019-07-18 MED ORDER — TACROLIMUS 1 MG PO CAPS
2.0000 mg | ORAL_CAPSULE | Freq: Every day | ORAL | Status: DC
  Administered 2019-07-19 – 2019-07-23 (×5): 2 mg via ORAL
  Filled 2019-07-18 (×5): qty 2

## 2019-07-18 MED ORDER — ACETAMINOPHEN 650 MG RE SUPP: 650.0000 mg | Freq: Four times a day (QID) | RECTAL | Status: DC | PRN

## 2019-07-18 MED ORDER — FENTANYL CITRATE (PF) 100 MCG/2ML IJ SOLN
50.0000 ug | Freq: Once | INTRAMUSCULAR | Status: AC
  Administered 2019-07-18: 50 ug via INTRAVENOUS
  Filled 2019-07-18: qty 2

## 2019-07-18 MED ORDER — SODIUM CHLORIDE 0.9 % IV SOLN: 100.0000 mg | Freq: Every day | INTRAVENOUS | Status: DC

## 2019-07-18 MED ORDER — LIDOCAINE 5 % EX PTCH
1.0000 | MEDICATED_PATCH | CUTANEOUS | Status: DC
  Administered 2019-07-18 – 2019-07-22 (×5): 1 via TRANSDERMAL
  Filled 2019-07-18 (×6): qty 1

## 2019-07-18 MED ORDER — GUAIFENESIN-DM 100-10 MG/5ML PO SYRP: 10.0000 mL | ORAL_SOLUTION | ORAL | Status: DC | PRN

## 2019-07-18 MED ORDER — SODIUM CHLORIDE 0.9 % IV SOLN: INTRAVENOUS | Status: DC

## 2019-07-18 MED ORDER — ACETAMINOPHEN 325 MG PO TABS: 650.0000 mg | ORAL_TABLET | Freq: Four times a day (QID) | ORAL | Status: DC | PRN

## 2019-07-18 MED ORDER — INSULIN GLARGINE 100 UNIT/ML ~~LOC~~ SOLN
10.0000 [IU] | Freq: Every day | SUBCUTANEOUS | Status: DC
  Administered 2019-07-18: 10 [IU] via SUBCUTANEOUS
  Filled 2019-07-18 (×3): qty 0.1

## 2019-07-18 MED ORDER — IPRATROPIUM-ALBUTEROL 20-100 MCG/ACT IN AERS
1.0000 | INHALATION_SPRAY | Freq: Four times a day (QID) | RESPIRATORY_TRACT | Status: DC
  Administered 2019-07-18 – 2019-07-23 (×15): 1 via RESPIRATORY_TRACT
  Filled 2019-07-18 (×2): qty 4

## 2019-07-18 MED ORDER — INSULIN ASPART 100 UNIT/ML ~~LOC~~ SOLN
0.0000 [IU] | Freq: Three times a day (TID) | SUBCUTANEOUS | Status: DC
  Administered 2019-07-18: 2 [IU] via SUBCUTANEOUS
  Administered 2019-07-19: 5 [IU] via SUBCUTANEOUS
  Administered 2019-07-19 (×2): 3 [IU] via SUBCUTANEOUS
  Administered 2019-07-20: 7 [IU] via SUBCUTANEOUS
  Administered 2019-07-20: 9 [IU] via SUBCUTANEOUS
  Administered 2019-07-20 – 2019-07-21 (×2): 7 [IU] via SUBCUTANEOUS
  Administered 2019-07-21 (×2): 5 [IU] via SUBCUTANEOUS
  Administered 2019-07-22: 3 [IU] via SUBCUTANEOUS
  Administered 2019-07-22: 5 [IU] via SUBCUTANEOUS
  Administered 2019-07-22 – 2019-07-23 (×2): 7 [IU] via SUBCUTANEOUS
  Administered 2019-07-23: 3 [IU] via SUBCUTANEOUS
  Filled 2019-07-18: qty 0.09

## 2019-07-18 MED ORDER — LORATADINE 10 MG PO TABS
10.0000 mg | ORAL_TABLET | Freq: Every day | ORAL | Status: DC
  Administered 2019-07-19 – 2019-07-23 (×5): 10 mg via ORAL
  Filled 2019-07-18 (×5): qty 1

## 2019-07-18 MED ORDER — OXYCODONE HCL 5 MG PO TABS
5.0000 mg | ORAL_TABLET | Freq: Four times a day (QID) | ORAL | Status: DC | PRN
  Administered 2019-07-18 – 2019-07-21 (×4): 5 mg via ORAL
  Filled 2019-07-18 (×5): qty 1

## 2019-07-18 MED ORDER — SODIUM CHLORIDE 0.9 % IV SOLN
100.0000 mg | Freq: Every day | INTRAVENOUS | Status: AC
  Administered 2019-07-19 – 2019-07-22 (×4): 100 mg via INTRAVENOUS
  Filled 2019-07-18 (×4): qty 20

## 2019-07-18 MED ORDER — ASPIRIN EC 81 MG PO TBEC
81.0000 mg | DELAYED_RELEASE_TABLET | Freq: Every day | ORAL | Status: DC
  Administered 2019-07-19 – 2019-07-23 (×5): 81 mg via ORAL
  Filled 2019-07-18 (×4): qty 1

## 2019-07-18 MED ORDER — ASCORBIC ACID 500 MG PO TABS
500.0000 mg | ORAL_TABLET | Freq: Every day | ORAL | Status: DC
  Administered 2019-07-19 – 2019-07-23 (×5): 500 mg via ORAL
  Filled 2019-07-18 (×5): qty 1

## 2019-07-18 MED ORDER — INSULIN ASPART 100 UNIT/ML ~~LOC~~ SOLN
0.0000 [IU] | Freq: Every day | SUBCUTANEOUS | Status: DC
  Administered 2019-07-19 – 2019-07-20 (×2): 3 [IU] via SUBCUTANEOUS
  Administered 2019-07-22: 2 [IU] via SUBCUTANEOUS
  Filled 2019-07-18: qty 0.05

## 2019-07-18 MED ORDER — SODIUM CHLORIDE 0.9 % IV SOLN: 200.0000 mg | Freq: Once | INTRAVENOUS | Status: DC

## 2019-07-18 MED ORDER — ZINC SULFATE 220 (50 ZN) MG PO CAPS
220.0000 mg | ORAL_CAPSULE | Freq: Every day | ORAL | Status: DC
  Administered 2019-07-19 – 2019-07-23 (×5): 220 mg via ORAL
  Filled 2019-07-18 (×5): qty 1

## 2019-07-18 NOTE — Telephone Encounter (Signed)
Agree with below. Seen at ER with posterior rib fracture after fall.  Back at ER with new onset seizure. Unsure if he had monoclonal Ab infusion.  plz call wife later today for update.  If discharged home, recommend virtual visit early next week for f/u.

## 2019-07-18 NOTE — H&P (Signed)
History and Physical    Colton Porter HBZ:[id number] DOB: 1965/10/10 DOA: 07/18/2019  PCP: Ria Bush, MD  Patient coming from: Home  I have personally briefly reviewed patient's old medical records in [place]  Chief Complaint: Shortness of breath, fatigue, weakness  HPI: Colton Porter is a 54 y.o. male with medical history significant of essential hypertension, hyperlipidemia, GERD, anemia, thrombocytopenia, chronic diastolic congestive heart failure, type 2 diabetes mellitus, alcoholic cirrhosis status post liver transplant, CKD stage III, gout, BPH 3 presented to the ED with progressive shortness of breath, weakness, fatigue.  Was seen in the ED yesterday after a presyncopal episode and discharged home with diagnosis of rib fracture.  Now representing with progressive shortness of breath and now hypoxic.  Initial concern of possible seizure activity, although no loss of bowel/bladder function or tongue biting.  Also patient endorses poor oral intake.  No other specific complaints at this time.  ED Course: Temperature 100.5, HR 105, BP 90/48, SPO2 87% on room air.  WBC count 2.8, hemoglobin 11.9, platelet 93, sodium 133, potassium 3.7, chloride 101, CO2 20, BUN 29, creatinine 1.75, glucose 183.  Procalcitonin less than 0.10, CRP 8.2, ferritin 290, LDH 182, D-dimer 0.90, fibrinogen 536.  Troponin XI.  CT head with no mass/hemorrhage, opacified right medial mastoid air cells.  Chest x-ray with hazy perihilar opacification.  Given his hypoxia, mild hypotension and elevated temperature, ED physician referred for admission.  Review of Systems: As per HPI otherwise 10 point review of systems negative.    Past Medical History:  Diagnosis Date  . Alcohol dependence ([place]) 07/11/2012  . Alcoholic cirrhosis of liver with ascites ([place]) 07/2014   s./p transplant 12/2015  . Allergy   . Anemia   . Cellulitis of left leg   . Chronic diastolic heart failure ([place]) 11/01/2013  . CKD  (chronic kidney disease) stage 3, GFR 30-59 ml/min 11/04/2015  . Diabetes mellitus without complication ([place])   . GERD (gastroesophageal reflux disease)   . Hyperlipidemia   . Hypertension   . Neuropathy   . OSA on CPAP 07/11/2012   HST 07/2013:  AHI 39/hr.    . Thrombocytopenia ([place]) 12/15/2011    Past Surgical History:  Procedure Laterality Date  . COLONOSCOPY  08/2013   hyperplastic polyps, hemorrhoids Ardis Hughs)  . 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 FINGER AMPUTATION  1997   left 4th finger - radial arm saw  . LEFT AND RIGHT HEART CATHETERIZATION WITH CORONARY ANGIOGRAM N/A 06/10/2013   Procedure: LEFT AND RIGHT HEART CATHETERIZATION WITH CORONARY ANGIOGRAM;  Surgeon: Blane Ohara, MD;  Location: [facility] CATH LAB;  Service: Cardiovascular;  Laterality: N/A;  . LIVER TRANSPLANTATION  03/1750   alcoholic cirrhosis ([facility] at [facility])     reports that he has never smoked. He has quit using smokeless tobacco.  His smokeless tobacco use included chew. He reports previous drug use. He reports that he does not drink alcohol.  Allergies  Allergen Reactions  . Tolmetin Rash    cirrhosis  . Ambien [Zolpidem Tartrate] Other (See Comments)    Over-toxicity from liver failure  . Morphine And Related     Hallucinations.  . Hydrochlorothiazide W-Triamterene Other (See Comments)    REACTION: dizzy, nausea  . Lisinopril Other (See Comments)    REACTION: cough, decreased libido    Family History  Problem Relation Age of Onset  . Stroke Mother   . Emphysema Mother   . Hypertension Father   .  Heart disease Father   . Emphysema Father   . Lung cancer Maternal Aunt   . Stroke Paternal Grandmother   . Heart attack Neg Hx     Family history reviewed and not pertinent   Prior to Admission medications   Medication Sig Start Date End Date Taking? Authorizing Provider  acetaminophen (TYLENOL) 500 MG tablet Take 500 mg by mouth every 6 (six)  hours as needed for mild pain.   Yes [provider]  allopurinol (ZYLOPRIM) 300 MG tablet Take 900 mg by mouth daily.  09/18/18  Yes [provider]  amLODipine (NORVASC) 10 MG tablet TAKE 1 TABLET EVERY DAY Patient taking differently: Take 10 mg by mouth daily.  03/14/19  Yes Ria Bush, MD  ASPIR-LOW 81 MG EC tablet Take 1 tablet (81 mg total) by mouth daily. 04/06/17  Yes Ria Bush, MD  Cholecalciferol (VITAMIN D3) 2000 units TABS Take 1 tablet by mouth at bedtime.   Yes [provider]  docusate sodium (COLACE) 100 MG capsule Take 100 mg by mouth at bedtime.    Yes [provider]  doxazosin (CARDURA) 8 MG tablet TAKE 1 TABLET BY MOUTH AT BEDTIME. Patient taking differently: Take 8 mg by mouth at bedtime.  01/11/19  Yes Ria Bush, MD  escitalopram (LEXAPRO) 10 MG tablet TAKE 1 TABLET EVERY DAY Patient taking differently: Take 10 mg by mouth at bedtime.  04/17/19  Yes Ria Bush, MD  fluticasone [facility]) 50 MCG/ACT nasal spray Place 2 sprays into both nostrils daily. 04/23/19  Yes Ria Bush, MD  furosemide (LASIX) 40 MG tablet TAKE 1 TABLET EVERY DAY Patient taking differently: Take 40 mg by mouth daily.  04/12/19  Yes Ria Bush, MD  glimepiride (AMARYL) 2 MG tablet Take 1 tablet (2 mg total) by mouth daily with breakfast. 01/18/19  Yes Ria Bush, MD  LANTUS SOLOSTAR 100 UNIT/ML Solostar Pen INJECT 15 UNITS INTO THE SKIN DAILY AT 10 PM Patient taking differently: Inject 10 Units into the skin at bedtime.  05/30/19  Yes Ria Bush, MD  loratadine (CLARITIN) 10 MG tablet Take 10 mg by mouth daily.   Yes [provider]  losartan (COZAAR) 50 MG tablet Take 1 tablet (50 mg total) by mouth daily. 05/13/19  Yes Ria Bush, MD  lovastatin (MEVACOR) 20 MG tablet Take 1 tablet (20 mg total) by mouth once a week. 06/19/19  Yes Ria Bush, MD  Multiple Vitamins-Minerals ([employer] SPECTRAVITE  PO) Take 1 tablet by mouth at bedtime.   Yes [provider]  Omega-3 Fatty Acids (FISH OIL PO) Take 1 tablet by mouth at bedtime.   Yes [provider]  omeprazole (PRILOSEC) 40 MG capsule TAKE 1 CAPSULE EVERY DAY Patient taking differently: Take 40 mg by mouth daily.  05/25/19  Yes Ria Bush, MD  oxyCODONE-acetaminophen (PERCOCET) 5-325 MG tablet Take 1 tablet by mouth every 4 (four) hours as needed for up to 3 days for severe pain. 07/17/19 07/20/19 Yes Dykstra, Ellwood Dense, MD  tacrolimus (PROGRAF) 1 MG capsule Take 1-2 mg by mouth See admin instructions. Take 2 mg by mouth in the morning, then take 1 mg  by mouth in the evening   Yes [provider]  Blood Glucose Monitoring Suppl (ACCU-CHEK GUIDE) w/Device KIT 1 Units by Does not apply route as directed. 06/29/17   Ria Bush, MD  glucose blood (ACCU-CHEK GUIDE) test strip Check sugars three times daily and as needed when feeling ill E11.65, insulin use 06/29/17  Ria Bush, MD  Insulin Pen Needle 31G X 5 MM MISC USE TO INJECT INSULIN DAILY 07/30/18   Ria Bush, MD  LANCETS ULTRA THIN 30G MISC Check sugars three times daily and as needed when feeling ill E11.65, insulin use 06/29/17   Ria Bush, MD  oxyCODONE (ROXICODONE) 5 MG immediate release tablet Take 1-2 tablets (5-10 mg total) by mouth every 4 (four) hours as needed for severe pain. Patient not taking: Reported on 09/20/7122 58/09/98   Delora Fuel, MD  rizatriptan (MAXALT) 10 MG tablet Take 1 tablet (10 mg total) by mouth as needed for migraine. May repeat in 2 hours if needed Patient not taking: Reported on 07/15/2019 04/23/19   Ria Bush, MD    Physical Exam: Vitals:   07/18/19 1230 07/18/19 1300 07/18/19 1335 07/18/19 1400  BP: 102/61 132/78 109/65 111/69  Pulse: 77 87 88 72  Resp: _0 Temp:      TempSrc:      SpO2: 97% 95% 96% 96%  Weight:      Height:        Constitutional: NAD, calm, comfortable Eyes:  PERRL, lids and conjunctivae normal ENMT: Mucous membranes are dry. Posterior pharynx clear of any exudate or lesions.Normal dentition.  Neck: normal, supple, no masses, no thyromegaly Respiratory: Mild wheezing bilaterally, breath sounds slightly decreased bilateral bases, normal respiratory effort, oxygenating 96% on 2 L nasal cannula. Cardiovascular: Regular rate and rhythm, no murmurs / rubs / gallops. No extremity edema. 2+ pedal pulses. No carotid bruits.  Abdomen: no tenderness, no masses palpated. No hepatosplenomegaly. Bowel sounds positive.  Musculoskeletal: no clubbing / cyanosis. No joint deformity upper and lower extremities. Good ROM, no contractures. Normal muscle tone.  Skin: no rashes, lesions, ulcers. No induration Neurologic: CN 2-12 grossly intact. Sensation intact, DTR normal. Strength 5/5 in all 4.  Psychiatric: Normal judgment and insight. Alert and oriented x 3. Normal mood.    Labs on Admission: I have personally reviewed following labs and imaging studies  CBC: Recent Labs  Lab 07/12/19 1844 07/15/19 1211 07/17/19 1406 07/18/19 1137  WBC 6.9 4.0 2.9* 2.8*  NEUTROABS  --  2.8 2.0 2.1  HGB 13.0 13.4 12.9* 11.9*  HCT 39.4 40.1 38.6* 36.0*  MCV 85.8 85.9 85.2 85.7  PLT 151 121* 94* 93*   Basic Metabolic Panel: Recent Labs  Lab 07/12/19 1844 07/15/19 1211 07/17/19 1133 07/18/19 1137  NA 133* 135 132* 133*  K 4.1 4.0 3.9 3.7  CL 98 99 99 101  CO2 26 24 19* 20*  GLUCOSE 332* 206* 188* 183*  BUN 23* 24* 23* 29*  CREATININE 1.96* 1.84* 1.72* 1.75*  CALCIUM 8.8* 9.0 8.6* 8.1*   GFR: Estimated Creatinine Clearance: 62.2 mL/min (A) (by C-G formula based on SCr of 1.75 mg/dL (H)). Liver Function Tests: Recent Labs  Lab 07/12/19 1844 07/15/19 1211 07/17/19 1133 07/18/19 1137  AST 27 35 42* 40  ALT 39 45* 43 43  ALKPHOS 88 72 62 56  BILITOT 1.3* 1.1 1.0 0.6  PROT 7.9 7.5 7.1 7.1  ALBUMIN 4.2 3.5 3.3* 3.2*   No results for input(s): LIPASE,  AMYLASE in the last 168 hours. No results for input(s): AMMONIA in the last 168 hours. Coagulation Profile: Recent Labs  Lab 07/15/19 1211  INR 1.0   Cardiac Enzymes: No results for input(s): CKTOTAL, CKMB, CKMBINDEX, TROPONINI in the last 168 hours. BNP (last 3 results) No results for input(s): PROBNP in the last 8760 hours. HbA1C: No  results for input(s): HGBA1C in the last 72 hours. CBG: No results for input(s): GLUCAP in the last 168 hours. Lipid Profile: Recent Labs    07/18/19 1137  TRIG 128   Thyroid Function Tests: No results for input(s): TSH, T4TOTAL, FREET4, T3FREE, THYROIDAB in the last 72 hours. Anemia Panel: Recent Labs    07/18/19 1137  FERRITIN 290   Urine analysis:    Component Value Date/Time   COLORURINE AMBER (A) 07/15/2019 1910   APPEARANCEUR HAZY (A) 07/15/2019 1910   LABSPEC 1.017 07/15/2019 1910   PHURINE 5.0 07/15/2019 1910   GLUCOSEU NEGATIVE 07/15/2019 1910   HGBUR NEGATIVE 07/15/2019 1910   BILIRUBINUR NEGATIVE 07/15/2019 1910   BILIRUBINUR NEG 01/19/2017 1059   KETONESUR NEGATIVE 07/15/2019 1910   PROTEINUR 30 (A) 07/15/2019 1910   UROBILINOGEN 0.2 01/19/2017 1059   UROBILINOGEN 1.0 11/21/2014 1655   NITRITE NEGATIVE 07/15/2019 1910   LEUKOCYTESUR NEGATIVE 07/15/2019 1910    Radiological Exams on Admission: CT ABDOMEN PELVIS WO CONTRAST  Result Date: 07/17/2019 CLINICAL DATA:  Recent fall with left flank pain, initial encounter EXAM: CT ABDOMEN AND PELVIS WITHOUT CONTRAST TECHNIQUE: Multidetector CT imaging of the abdomen and pelvis was performed following the standard protocol without IV contrast. COMPARISON:  None. FINDINGS: Lower chest: Lung bases demonstrate patchy ground-glass opacities particularly in the right lower lobe consistent with the given clinical history of COVID-19 positivity. Hepatobiliary: Fatty infiltration of the liver is noted. The gallbladder has been surgically removed. Pancreas: Unremarkable. No pancreatic  ductal dilatation or surrounding inflammatory changes. Spleen: Normal in size without focal abnormality. Adrenals/Urinary Tract: Adrenal glands are within normal limits. Kidneys are well visualized bilaterally. No renal calculi or urinary tract obstructive changes are seen. The bladder is decompressed. Stomach/Bowel: Colon is well visualized and decompressed. The appendix is within normal limits. No small bowel abnormality is noted. The stomach is unremarkable. Vascular/Lymphatic: Aortic atherosclerosis. No enlarged abdominal or pelvic lymph nodes. Reproductive: Prostate is unremarkable. Other: No free fluid is noted. Some fat containing anterior abdominal wall hernias are seen to the right of the midline in area of previous surgery. Musculoskeletal: Mildly displaced fracture of the left eleventh rib is noted centrally. No other definitive rib fractures are seen. No compression deformities are noted. Degenerative changes of the lower thoracic spine are seen. IMPRESSION: Eleventh rib fracture posteriorly near the costovertebral junction without complicating factors. Patchy ground-glass opacities particularly in the right lower lobe consistent with the given clinical history of COVID-19 positivity. No other focal abnormality is seen. Electronically Signed   By: Inez Catalina M.D.   On: 07/17/2019 13:52   CT Head Wo Contrast  Result Date: 07/18/2019 CLINICAL DATA:  Seizure.  COVID-19 positive EXAM: CT HEAD WITHOUT CONTRAST TECHNIQUE: Contiguous axial images were obtained from the base of the skull through the vertex without intravenous contrast. COMPARISON:  None. FINDINGS: Brain: Ventricles and sulci are normal in size and configuration. There is no intracranial mass, hemorrhage, extra-axial fluid collection, or midline shift. Brain parenchyma appears unremarkable. No evident acute infarct. Vascular: No hyperdense vessel. No appreciable vascular calcification. Skull: The bony calvarium appears intact.  Sinuses/Orbits: There is mucosal thickening in several ethmoid air cells. There is mild rightward deviation of the nasal septum. There is a concha bullosa on the left, an anatomic variant. Orbits appear symmetric bilaterally. Other: Mastoid air cells on the left are clear. There is opacification of multiple mastoid air cells on the right. IMPRESSION: Brain parenchyma appears unremarkable.  No mass or hemorrhage. Opacification of multiple  right-sided mastoid air cells. There is mucosal thickening in several ethmoid air cells. Deviated nasal septum noted. Electronically Signed   By: Lowella Grip III M.D.   On: 07/18/2019 13:08   CT L-SPINE NO CHARGE  Result Date: 07/17/2019 CLINICAL DATA:  Left flank pain.  History of liver transplant. EXAM: CT LUMBAR SPINE WITHOUT CONTRAST TECHNIQUE: Multidetector CT imaging of the lumbar spine was performed without intravenous contrast administration. Multiplanar CT image reconstructions were also generated. COMPARISON:  None. FINDINGS: Segmentation: Normal Alignment: Normal Vertebrae: Negative for lumbar fracture or mass. Paraspinal and other soft tissues: Negative for paraspinous mass or adenopathy. Disc levels: L1-2: Mild degenerative change.  Negative for stenosis L2-3: Mild disc and mild facet degeneration.  Negative for stenosis L3-4: Mild disc and mild facet degeneration.  Negative for stenosis L4-5: Mild disc bulging and mild facet degeneration. Negative for stenosis L5-S1: Negative IMPRESSION: Mild lumbar degenerative change. No acute abnormality. Negative for neural impingement or stenosis. Electronically Signed   By: Franchot Gallo M.D.   On: 07/17/2019 13:51   DG Chest Port 1 View  Result Date: 07/18/2019 CLINICAL DATA:  Seizure witnessed by family. COVID positive. Rib fracture yesterday. EXAM: PORTABLE CHEST 1 VIEW COMPARISON:  07/17/2019 FINDINGS: Lungs are adequately inflated and demonstrate subtle stable hazy perihilar opacification which may be due to  edema or infection. No effusion. Mild stable cardiomegaly. Remainder the exam is unchanged. IMPRESSION: Subtle stable hazy perihilar opacification which may be due to mild vascular congestion or infection. Mild stable cardiomegaly. Electronically Signed   By: Marin Olp M.D.   On: 07/18/2019 12:57   DG Chest Portable 1 View  Result Date: 07/17/2019 CLINICAL DATA:  Fever and shortness of breath.  COVID positive. EXAM: PORTABLE CHEST 1 VIEW COMPARISON:  Chest x-ray dated July 15, 2019. FINDINGS: The heart size and mediastinal contours are within normal limits. Normal pulmonary vascularity. Slightly increased markings at the right lung base. No pleural effusion or pneumothorax. No acute osseous abnormality. IMPRESSION: Slightly increased markings at the right lung base may reflect pneumonia. Electronically Signed   By: Titus Dubin M.D.   On: 07/17/2019 12:22    EKG: Independently reviewed.   Assessment/Plan Active Problems:   Acute respiratory failure with hypoxia (HCC)   Pneumonia due to COVID-19 virus   Acute respiratory failure with hypoxia Acute hypoxic respiratory failure secondary to acute Covid-19 viral pneumonia during the ongoing Covid 19 Pandemic - POA Patient diagnosed with Covid-19/SARS-CoV-2 on 07/15/2019.  Has had progressive shortness of breath.  Now hypoxic satting 98% on room air.  Elevated inflammatory markers. --remdesivir, plan 5-day course --Decadron 6 mg IV daily --Continue supplemental oxygen, titrate to maintain SPO2 greater than 92% --Continue supportive care with vitamin C, zinc, Tylenol --Follow CBC, CMP, D-dimer, ferritin, and CRP daily --Continue airborne/contact isolation precautions  Hypotension in the setting of Hx essential hypertension BP 90/48 on presentation.  On amlodipine, Cardura, furosemide, losartan outpatient.  BP improved to 109/65 with IV fluid bolus. --Hold home antihypertensives --Continue monitor blood pressure closely, will restart when  appropriate --NS at 80m/hr  Hyponatremia Sodium level 133 on admission.  Etiology likely secondary to hypovolemic hyponatremia in the setting of poor oral intake over the preceding days.  Received 1 L IV fluid bolus of normal saline in ED. --Check urine osmolality, urine sodium --Gentle IV fluid hydration over the next 24 hours --Repeat sodium level in a.m.  Hyperlipidemia: Hold home statin for now  GERD: Continue PPI  Pancytopenia --Stable --Continue to monitor CBC  daily  Chronic diastolic congestive heart failure TTE 06/09/2016 with LVEF 55-60% and grade 2 diastolic dysfunction. --Holding home furosemide secondary to hypotension on presentation  Type 2 diabetes mellitus Hemoglobin A1c 7.7 04/23/2019; fairly well controlled. --Continue home Lantus 10 units subcutaneously daily --Insulin/scale for further coverage  History of EtOH cirrhosis status post liver transplant --Continue home tacrolimus 2 mg qAM, 1 mg qPM --Check tacrolimus level  CKD stage III Creatinine baseline 1.2-1.6.  Creatinine on presentation 1.75. --Continue with IV fluid hydration --Avoid nephrotoxins, renally dose all medications --Follow renal function daily  BPH: Holding home Cardura secondary to mild hypotension on presentation  OSA: We will avoid CPAP given positive Covid infection  DVT prophylaxis: Lovenox Code Status: Full code Family Communication: Discussed with patient extensively at bedside Disposition Plan: Anticipate discharge home once titrated off of supplemental oxygen Consults called: None Admission status: Inpatient, telemetry, transfer to [facility]   Severity of Illness: The appropriate patient status for this patient is INPATIENT. Inpatient status is judged to be reasonable and necessary in order to provide the required intensity of service to ensure the patient's safety. The patient's presenting symptoms, physical exam findings, and initial radiographic and laboratory data in the  context of their chronic comorbidities is felt to place them at high risk for further clinical deterioration. Furthermore, it is not anticipated that the patient will be medically stable for discharge from the hospital within 2 midnights of admission. The following factors support the patient status of inpatient.   " The patient's presenting symptoms include dyspnea, weakness, fatigue " The worrisome physical exam findings include elevated temperature, elevated heart rate, hypotension, hypoxia " The initial radiographic and laboratory data are worrisome because of perihilar opacification on chest x-ray, elevated inflammatory markers " The chronic co-morbidities include immunosuppressed on tacrolimus secondary to liver transplant, hypertension, GERD, pancytopenia, chronic diastolic congestive heart failure, type 2 diabetes mellitus, OSA.   * I certify that at the point of admission it is my clinical judgment that the patient will require inpatient hospital care spanning beyond 2 midnights from the point of admission due to high intensity of service, high risk for further deterioration and high frequency of surveillance required.*    Eric J [place] DO [facility] Available via Epic secure chat 7am-7pm After these hours, please refer to coverage provider listed on [email or web address] 07/18/2019, 2:16 PM

## 2019-07-18 NOTE — ED Provider Notes (Signed)
[name] Provider Note   CSN: [id number] Arrival date & time: 07/18/19  1053     History Chief Complaint  Patient presents with  . Seizures  . Shortness of Breath    Colton Porter is a 54 y.o. male.  Past medical history liver failure s/p liver transplant on Prograf.  Presents to ER with ongoing symptoms of COVID-19 as well as seizure episode.  Patient was in ER yesterday after having ongoing Covid symptoms as well as fall and syncopal episode.  Work-up at that time was concerning for single nondisplaced left-sided rib fracture, vital signs and oxygen levels were stable and pain controlled and he was discharged home with plan for close return precautions.  Patient states this morning he feels relatively the same, except still having hard time with pain in his left lower chest wall.  This morning while going to the bathroom he felt lightheaded and then passed out.  States his wife is concerned that he had seizure activity, total duration lasted less than 2 minutes, no report of postictal period.  No bladder or bowel incontinence, patient denies any focal weakness, no numbness, no vision changes.  HPI     Past Medical History:  Diagnosis Date  . Alcohol dependence ([place]) 07/11/2012  . Alcoholic cirrhosis of liver with ascites ([place]) 07/2014   s./p transplant 12/2015  . Allergy   . Anemia   . Cellulitis of left leg   . Chronic diastolic heart failure ([place]) 11/01/2013  . CKD (chronic kidney disease) stage 3, GFR 30-59 ml/min 11/04/2015  . Diabetes mellitus without complication ([place])   . GERD (gastroesophageal reflux disease)   . Hyperlipidemia   . Hypertension   . Neuropathy   . OSA on CPAP 07/11/2012   HST 07/2013:  AHI 39/hr.    . Thrombocytopenia ([place]) 12/15/2011    Patient Active Problem List   Diagnosis Date Noted  . Acute respiratory failure with hypoxia ([place]) 07/18/2019  . Pneumonia due to COVID-19 virus 07/18/2019  . [place] lesion, left  02/05/2018  . Chronic heel pain, left 10/14/2017  . Anejaculation 08/09/2017  . Chronic gout 05/22/2017  . Foot swelling 04/12/2017  . Disorder of ejaculation 04/06/2017  . Dyspnea 11/18/2016  . BPH (benign prostatic hyperplasia) 10/05/2016  . Status post liver transplant ([place]) 03/15/2016  . Portal hypertensive gastropathy ([place]) 12/04/2015  . Type 2 diabetes mellitus with other specified complication ([place]) 38/03/1750  . Gynecomastia   . CKD (chronic kidney disease) stage 3, GFR 30-59 ml/min (HCC) 11/04/2015  . Depression, major, single episode, moderate ([place]) 07/09/2015  . Headache 02/19/2015  . Insomnia 12/06/2014  . Hypomagnesemia   . Other fatigue 11/18/2014  . Internal hemorrhoid 10/01/2014  . Erectile dysfunction 10/01/2014  . Alcoholic cirrhosis of liver without ascites ([place]) 07/28/2014  . (HFpEF) heart failure with preserved ejection fraction ([place]) 11/01/2013  . Essential hypertension 06/04/2013  . OSA on CPAP 07/11/2012  . History of alcohol dependence ([place]) 07/11/2012  . Thrombocytopenia ([place]) 12/15/2011  . Morbid obesity with BMI of 50.0-59.9, adult ([place]) 11/16/2011  . Routine general medical examination at a health care facility 09/22/2010  . NEUROPATHY 05/27/2009  . Pedal edema 09/22/2008  . Dyslipidemia 02/15/2007  . Allergic rhinitis 02/15/2007  . GERD 02/15/2007    Past Surgical History:  Procedure Laterality Date  . COLONOSCOPY  08/2013   hyperplastic polyps, hemorrhoids Ardis Hughs)  . 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 [place]  left 4th finger - radial arm saw  . LEFT AND RIGHT HEART CATHETERIZATION WITH CORONARY ANGIOGRAM N/A 06/10/2013   Procedure: LEFT AND RIGHT HEART CATHETERIZATION WITH CORONARY ANGIOGRAM;  Surgeon: Blane Ohara, MD;  Location: [facility] CATH LAB;  Service: Cardiovascular;  Laterality: N/A;  . LIVER TRANSPLANTATION  63/8756   alcoholic cirrhosis ([facility] at [facility])        Family History  Problem Relation Age of Onset  . Stroke Mother   . Emphysema Mother   . Hypertension Father   . Heart disease Father   . Emphysema Father   . Lung cancer Maternal Aunt   . Stroke Paternal Grandmother   . Heart attack Neg Hx     Social History   Tobacco Use  . Smoking status: Never Smoker  . Smokeless tobacco: Former Systems developer    Types: Chew  Substance Use Topics  . Alcohol use: No    Alcohol/week: 0.0 standard drinks    Comment: 4-6 drinks daily - NO ETOH SINCE APRIL 2016  . Drug use: Not Currently    Comment: remote use of marijuana in the past, has since quit    Home Medications Prior to Admission medications   Medication Sig Start Date End Date Taking? Authorizing Provider  acetaminophen (TYLENOL) 500 MG tablet Take 500 mg by mouth every 6 (six) hours as needed for mild pain.   Yes [provider]  allopurinol (ZYLOPRIM) 300 MG tablet Take 900 mg by mouth daily.  09/18/18  Yes [provider]  amLODipine (NORVASC) 10 MG tablet TAKE 1 TABLET EVERY DAY Patient taking differently: Take 10 mg by mouth daily.  03/14/19  Yes Ria Bush, MD  ASPIR-LOW 81 MG EC tablet Take 1 tablet (81 mg total) by mouth daily. 04/06/17  Yes Ria Bush, MD  Cholecalciferol (VITAMIN D3) 2000 units TABS Take 1 tablet by mouth at bedtime.   Yes [provider]  docusate sodium (COLACE) 100 MG capsule Take 100 mg by mouth at bedtime.    Yes [provider]  doxazosin (CARDURA) 8 MG tablet TAKE 1 TABLET BY MOUTH AT BEDTIME. Patient taking differently: Take 8 mg by mouth at bedtime.  01/11/19  Yes Ria Bush, MD  escitalopram (LEXAPRO) 10 MG tablet TAKE 1 TABLET EVERY DAY Patient taking differently: Take 10 mg by mouth at bedtime.  04/17/19  Yes Ria Bush, MD  fluticasone [facility]) 50 MCG/ACT nasal spray Place 2 sprays into both nostrils daily. 04/23/19  Yes Ria Bush, MD  furosemide (LASIX) 40 MG tablet TAKE 1  TABLET EVERY DAY Patient taking differently: Take 40 mg by mouth daily.  04/12/19  Yes Ria Bush, MD  glimepiride (AMARYL) 2 MG tablet Take 1 tablet (2 mg total) by mouth daily with breakfast. 01/18/19  Yes Ria Bush, MD  LANTUS SOLOSTAR 100 UNIT/ML Solostar Pen INJECT 15 UNITS INTO THE SKIN DAILY AT 10 PM Patient taking differently: Inject 10 Units into the skin at bedtime.  05/30/19  Yes Ria Bush, MD  loratadine (CLARITIN) 10 MG tablet Take 10 mg by mouth daily.   Yes [provider]  losartan (COZAAR) 50 MG tablet Take 1 tablet (50 mg total) by mouth daily. 05/13/19  Yes Ria Bush, MD  lovastatin (MEVACOR) 20 MG tablet Take 1 tablet (20 mg total) by mouth once a week. 06/19/19  Yes Ria Bush, MD  Multiple Vitamins-Minerals ([employer] SPECTRAVITE PO) Take 1 tablet by mouth at bedtime.   Yes [provider]  Omega-3 Fatty Acids (Ambia  OIL PO) Take 1 tablet by mouth at bedtime.   Yes [provider]  omeprazole (PRILOSEC) 40 MG capsule TAKE 1 CAPSULE EVERY DAY Patient taking differently: Take 40 mg by mouth daily.  05/25/19  Yes Ria Bush, MD  oxyCODONE-acetaminophen (PERCOCET) 5-325 MG tablet Take 1 tablet by mouth every 4 (four) hours as needed for up to 3 days for severe pain. 07/17/19 07/20/19 Yes Javanni Maring, Ellwood Dense, MD  tacrolimus (PROGRAF) 1 MG capsule Take 1-2 mg by mouth See admin instructions. Take 2 mg by mouth in the morning, then take 1 mg  by mouth in the evening   Yes [provider]  Blood Glucose Monitoring Suppl (ACCU-CHEK GUIDE) w/Device KIT 1 Units by Does not apply route as directed. 06/29/17   Ria Bush, MD  glucose blood (ACCU-CHEK GUIDE) test strip Check sugars three times daily and as needed when feeling ill E11.65, insulin use 06/29/17   Ria Bush, MD  Insulin Pen Needle 31G X 5 MM MISC USE TO INJECT INSULIN DAILY 07/30/18   Ria Bush, MD  LANCETS ULTRA THIN 30G MISC Check sugars  three times daily and as needed when feeling ill E11.65, insulin use 06/29/17   Ria Bush, MD  oxyCODONE (ROXICODONE) 5 MG immediate release tablet Take 1-2 tablets (5-10 mg total) by mouth every 4 (four) hours as needed for severe pain. Patient not taking: Reported on 0/34/7425 95/63/87   Delora Fuel, MD  rizatriptan (MAXALT) 10 MG tablet Take 1 tablet (10 mg total) by mouth as needed for migraine. May repeat in 2 hours if needed Patient not taking: Reported on 07/15/2019 04/23/19   Ria Bush, MD    Allergies    Tolmetin, Ambien [zolpidem tartrate], Morphine and related, Hydrochlorothiazide w-triamterene, and Lisinopril  Review of Systems   Review of Systems  Constitutional: Positive for chills, fatigue and fever.  HENT: Negative for ear pain and sore throat.   Eyes: Negative for pain and visual disturbance.  Respiratory: Positive for cough and shortness of breath.   Cardiovascular: Positive for chest pain. Negative for palpitations.  Gastrointestinal: Negative for abdominal pain and vomiting.  Genitourinary: Negative for dysuria and hematuria.  Musculoskeletal: Negative for arthralgias and back pain.  Skin: Negative for color change and rash.  Neurological: Positive for seizures and syncope.  All other systems reviewed and are negative.   Physical Exam Updated Vital Signs BP 122/71   Pulse 76   Temp 98.2 F (36.8 C) (Oral)   Resp 18   Ht '5\' 6"'$  (1.676 m)   Wt 127 kg   SpO2 92%   BMI 45.19 kg/m   Physical Exam Vitals and nursing note reviewed.  Constitutional:      Appearance: He is well-developed.  HENT:     Head: Normocephalic and atraumatic.  Eyes:     Conjunctiva/sclera: Conjunctivae normal.  Cardiovascular:     Rate and Rhythm: Normal rate and regular rhythm.     Heart sounds: No murmur.  Pulmonary:     Comments: Mild tachypnea, no respiratory distress, somewhat coarse breath sounds bilaterally, there is tenderness to palpation over his left lower  lateral chest wall Chest:     Chest wall: Tenderness present. No mass.  Abdominal:     Palpations: Abdomen is soft.     Tenderness: There is no abdominal tenderness.  Musculoskeletal:     Cervical back: Neck supple.  Skin:    General: Skin is warm and dry.  Neurological:     General: No focal  deficit present.     Mental Status: He is alert. He is disoriented.     Cranial Nerves: No cranial nerve deficit.     Motor: No weakness.     ED Results / Procedures / Treatments   Labs (all labs ordered are listed, but only abnormal results are displayed) Labs Reviewed  CBC WITH DIFFERENTIAL/PLATELET - Abnormal; Notable for the following components:      Result Value   WBC 2.8 (*)    RBC 4.20 (*)    Hemoglobin 11.9 (*)    HCT 36.0 (*)    Platelets 93 (*)    Lymphs Abs 0.5 (*)    All other components within normal limits  COMPREHENSIVE METABOLIC PANEL - Abnormal; Notable for the following components:   Sodium 133 (*)    CO2 20 (*)    Glucose, Bld 183 (*)    BUN 29 (*)    Creatinine, Ser 1.75 (*)    Calcium 8.1 (*)    Albumin 3.2 (*)    GFR calc non Af Amer 44 (*)    GFR calc Af Amer 51 (*)    All other components within normal limits  D-DIMER, QUANTITATIVE (NOT AT [facility]) - Abnormal; Notable for the following components:   D-Dimer, Quant 0.90 (*)    All other components within normal limits  FIBRINOGEN - Abnormal; Notable for the following components:   Fibrinogen 536 (*)    All other components within normal limits  C-REACTIVE PROTEIN - Abnormal; Notable for the following components:   CRP 8.2 (*)    All other components within normal limits  CBG MONITORING, ED - Abnormal; Notable for the following components:   Glucose-Capillary 185 (*)    All other components within normal limits  CULTURE, BLOOD (ROUTINE X 2)  CULTURE, BLOOD (ROUTINE X 2)  PROCALCITONIN  LACTATE DEHYDROGENASE  FERRITIN  TRIGLYCERIDES  LACTIC ACID, PLASMA  HIV ANTIBODY (ROUTINE TESTING W REFLEX)  TSH   SODIUM, URINE, RANDOM  OSMOLALITY, URINE  HEMOGLOBIN A1C  INFLUENZA PANEL BY PCR (TYPE A & B)  CBC WITH DIFFERENTIAL/PLATELET  COMPREHENSIVE METABOLIC PANEL  C-REACTIVE PROTEIN  D-DIMER, QUANTITATIVE (NOT AT [facility])  FERRITIN  TACROLIMUS LEVEL  ABO/RH  TROPONIN I (HIGH SENSITIVITY)    EKG EKG Interpretation  Date/Time:  Thursday July 18 2019 11:32:39 EST Ventricular Rate:  83 PR Interval:    QRS Duration: 93 QT Interval:  366 QTC Calculation: 430 R Axis:   57 Text Interpretation: Sinus rhythm Low voltage, precordial leads Confirmed by Madalyn Rob [id number]) on 07/18/2019 12:58:26 PM   Radiology CT ABDOMEN PELVIS WO CONTRAST  Result Date: 07/17/2019 CLINICAL DATA:  Recent fall with left flank pain, initial encounter EXAM: CT ABDOMEN AND PELVIS WITHOUT CONTRAST TECHNIQUE: Multidetector CT imaging of the abdomen and pelvis was performed following the standard protocol without IV contrast. COMPARISON:  None. FINDINGS: Lower chest: Lung bases demonstrate patchy ground-glass opacities particularly in the right lower lobe consistent with the given clinical history of COVID-19 positivity. Hepatobiliary: Fatty infiltration of the liver is noted. The gallbladder has been surgically removed. Pancreas: Unremarkable. No pancreatic ductal dilatation or surrounding inflammatory changes. Spleen: Normal in size without focal abnormality. Adrenals/Urinary Tract: Adrenal glands are within normal limits. Kidneys are well visualized bilaterally. No renal calculi or urinary tract obstructive changes are seen. The bladder is decompressed. Stomach/Bowel: Colon is well visualized and decompressed. The appendix is within normal limits. No small bowel abnormality is noted. The stomach is unremarkable. Vascular/Lymphatic: Aortic atherosclerosis.  No enlarged abdominal or pelvic lymph nodes. Reproductive: Prostate is unremarkable. Other: No free fluid is noted. Some fat containing anterior abdominal wall hernias  are seen to the right of the midline in area of previous surgery. Musculoskeletal: Mildly displaced fracture of the left eleventh rib is noted centrally. No other definitive rib fractures are seen. No compression deformities are noted. Degenerative changes of the lower thoracic spine are seen. IMPRESSION: Eleventh rib fracture posteriorly near the costovertebral junction without complicating factors. Patchy ground-glass opacities particularly in the right lower lobe consistent with the given clinical history of COVID-19 positivity. No other focal abnormality is seen. Electronically Signed   By: Inez Catalina M.D.   On: 07/17/2019 13:52   CT Head Wo Contrast  Result Date: 07/18/2019 CLINICAL DATA:  Seizure.  COVID-19 positive EXAM: CT HEAD WITHOUT CONTRAST TECHNIQUE: Contiguous axial images were obtained from the base of the skull through the vertex without intravenous contrast. COMPARISON:  None. FINDINGS: Brain: Ventricles and sulci are normal in size and configuration. There is no intracranial mass, hemorrhage, extra-axial fluid collection, or midline shift. Brain parenchyma appears unremarkable. No evident acute infarct. Vascular: No hyperdense vessel. No appreciable vascular calcification. Skull: The bony calvarium appears intact. Sinuses/Orbits: There is mucosal thickening in several ethmoid air cells. There is mild rightward deviation of the nasal septum. There is a concha bullosa on the left, an anatomic variant. Orbits appear symmetric bilaterally. Other: Mastoid air cells on the left are clear. There is opacification of multiple mastoid air cells on the right. IMPRESSION: Brain parenchyma appears unremarkable.  No mass or hemorrhage. Opacification of multiple right-sided mastoid air cells. There is mucosal thickening in several ethmoid air cells. Deviated nasal septum noted. Electronically Signed   By: Lowella Grip III M.D.   On: 07/18/2019 13:08   CT L-SPINE NO CHARGE  Result Date:  07/17/2019 CLINICAL DATA:  Left flank pain.  History of liver transplant. EXAM: CT LUMBAR SPINE WITHOUT CONTRAST TECHNIQUE: Multidetector CT imaging of the lumbar spine was performed without intravenous contrast administration. Multiplanar CT image reconstructions were also generated. COMPARISON:  None. FINDINGS: Segmentation: Normal Alignment: Normal Vertebrae: Negative for lumbar fracture or mass. Paraspinal and other soft tissues: Negative for paraspinous mass or adenopathy. Disc levels: L1-2: Mild degenerative change.  Negative for stenosis L2-3: Mild disc and mild facet degeneration.  Negative for stenosis L3-4: Mild disc and mild facet degeneration.  Negative for stenosis L4-5: Mild disc bulging and mild facet degeneration. Negative for stenosis L5-S1: Negative IMPRESSION: Mild lumbar degenerative change. No acute abnormality. Negative for neural impingement or stenosis. Electronically Signed   By: Franchot Gallo M.D.   On: 07/17/2019 13:51   DG Chest Port 1 View  Result Date: 07/18/2019 CLINICAL DATA:  Seizure witnessed by family. COVID positive. Rib fracture yesterday. EXAM: PORTABLE CHEST 1 VIEW COMPARISON:  07/17/2019 FINDINGS: Lungs are adequately inflated and demonstrate subtle stable hazy perihilar opacification which may be due to edema or infection. No effusion. Mild stable cardiomegaly. Remainder the exam is unchanged. IMPRESSION: Subtle stable hazy perihilar opacification which may be due to mild vascular congestion or infection. Mild stable cardiomegaly. Electronically Signed   By: Marin Olp M.D.   On: 07/18/2019 12:57   DG Chest Portable 1 View  Result Date: 07/17/2019 CLINICAL DATA:  Fever and shortness of breath.  COVID positive. EXAM: PORTABLE CHEST 1 VIEW COMPARISON:  Chest x-ray dated July 15, 2019. FINDINGS: The heart size and mediastinal contours are within normal limits. Normal pulmonary vascularity. Slightly  increased markings at the right lung base. No pleural effusion or  pneumothorax. No acute osseous abnormality. IMPRESSION: Slightly increased markings at the right lung base may reflect pneumonia. Electronically Signed   By: Titus Dubin M.D.   On: 07/17/2019 12:22    Procedures .Critical Care Performed by: Lucrezia Starch, MD Authorized by: Lucrezia Starch, MD   Critical care provider statement:    Critical care time (minutes):  36   Critical care was necessary to treat or prevent imminent or life-threatening deterioration of the following conditions:  Respiratory failure   Critical care was time spent personally by me on the following activities:  Discussions with consultants, evaluation of patient's response to treatment, examination of patient, ordering and performing treatments and interventions, ordering and review of laboratory studies, ordering and review of radiographic studies, pulse oximetry, re-evaluation of patient's condition, obtaining history from patient or surrogate and review of old charts   (including critical care time)  Medications Ordered in ED Medications  aspirin EC tablet 81 mg (has no administration in time range)  escitalopram (LEXAPRO) tablet 10 mg (has no administration in time range)  insulin glargine (LANTUS) injection 10 Units (has no administration in time range)  pantoprazole (PROTONIX) EC tablet 80 mg (has no administration in time range)  tacrolimus (PROGRAF) capsule 1 mg (has no administration in time range)  loratadine (CLARITIN) tablet 10 mg (has no administration in time range)  lidocaine (LIDODERM) 5 % 1 patch (has no administration in time range)  enoxaparin (LOVENOX) injection 60 mg (has no administration in time range)  acetaminophen (TYLENOL) tablet 650 mg (has no administration in time range)    Or  acetaminophen (TYLENOL) suppository 650 mg (has no administration in time range)  polyethylene glycol (MIRALAX / GLYCOLAX) packet 17 g (has no administration in time range)  dexamethasone (DECADRON)  injection 6 mg (has no administration in time range)  Ipratropium-Albuterol (COMBIVENT) respimat 1 puff (has no administration in time range)  guaiFENesin-dextromethorphan (ROBITUSSIN DM) 100-10 MG/5ML syrup 10 mL (has no administration in time range)  ascorbic acid (VITAMIN C) tablet 500 mg (has no administration in time range)  zinc sulfate capsule 220 mg (has no administration in time range)  oxyCODONE (Oxy IR/ROXICODONE) immediate release tablet 5 mg (has no administration in time range)  insulin aspart (novoLOG) injection 0-9 Units (has no administration in time range)  insulin aspart (novoLOG) injection 0-5 Units (has no administration in time range)  0.9 %  sodium chloride infusion (has no administration in time range)  tacrolimus (PROGRAF) capsule 2 mg (has no administration in time range)  remdesivir 100 mg in sodium chloride 0.9 % 100 mL IVPB (has no administration in time range)  fentaNYL (SUBLIMAZE) injection 50 mcg (50 mcg Intravenous Given 07/18/19 1208)  sodium chloride 0.9 % bolus 1,000 mL (0 mLs Intravenous Stopped 07/18/19 1338)  remdesivir 200 mg in sodium chloride 0.9% 250 mL IVPB (0 mg Intravenous Stopped 07/18/19 1623)    ED Course  I have reviewed the triage vital signs and the nursing notes.  Pertinent labs & imaging results that were available during my care of the patient were reviewed by me and considered in my medical decision making (see chart for details).    MDM Rules/Calculators/A&P                      54 year old male presented to ER after a possible seizure episode in setting of COVID-19.  CT head negative, EKG without new  changes, suspect more likely syncopal episode with myoclonic jerks.  Compared to my evaluation yesterday, patient is having increased work of breathing and having difficulty with pain control.  He has borderline hypoxic and was placed on 2 L nasal cannula.  At this time, I believe he would benefit from admission for further management of his  respiratory condition, pain control and pulmonary toilet, as well as cardiac monitoring.  Will consult hosp for admit.  [place]. [place] will admit.  Final Clinical Impression(s) / ED Diagnoses Final diagnoses:  COVID-19  Closed fracture of one rib of left side with routine healing, subsequent encounter  Acute respiratory failure with hypoxia [facility])    Rx / DC Orders ED Discharge Orders    None       Lucrezia Starch, MD 07/18/19 1729

## 2019-07-18 NOTE — Telephone Encounter (Signed)
Left message on vm per dpr asking pt/pt's wife, Suanne Marker (on dpr), to call back with an update on pt.  Also recommended, per Dr. Darnell Level, scheduling a virtual visit early next week.

## 2019-07-18 NOTE — ED Triage Notes (Signed)
Seizure witnessed by family, lasted 2-4 min. No hx of seizures. Covid+ on Monday. Was seen yesterday at [facility] for fall and dx with fractured rib.   400 c NS en route   BP 97/60 CBG 211 O2 99% on 2L [place]

## 2019-07-18 NOTE — ED Notes (Signed)
Report to Georgia Lopes, RN at [facility].

## 2019-07-19 DIAGNOSIS — I10 Essential (primary) hypertension: Secondary | ICD-10-CM

## 2019-07-19 DIAGNOSIS — Z794 Long term (current) use of insulin: Secondary | ICD-10-CM

## 2019-07-19 DIAGNOSIS — Z944 Liver transplant status: Secondary | ICD-10-CM

## 2019-07-19 DIAGNOSIS — E119 Type 2 diabetes mellitus without complications: Secondary | ICD-10-CM

## 2019-07-19 LAB — CBC
HCT: 33.9 % — ABNORMAL LOW (ref 39.0–52.0)
Hemoglobin: 11.4 g/dL — ABNORMAL LOW (ref 13.0–17.0)
MCH: 28.2 pg (ref 26.0–34.0)
MCHC: 33.6 g/dL (ref 30.0–36.0)
MCV: 83.9 fL (ref 80.0–100.0)
Platelets: 87 10*3/uL — ABNORMAL LOW (ref 150–400)
RBC: 4.04 MIL/uL — ABNORMAL LOW (ref 4.22–5.81)
RDW: 13.8 % (ref 11.5–15.5)
WBC: 2.4 10*3/uL — ABNORMAL LOW (ref 4.0–10.5)
nRBC: 0 % (ref 0.0–0.2)

## 2019-07-19 LAB — COMPREHENSIVE METABOLIC PANEL
ALT: 37 U/L (ref 0–44)
AST: 30 U/L (ref 15–41)
Albumin: 2.9 g/dL — ABNORMAL LOW (ref 3.5–5.0)
Alkaline Phosphatase: 49 U/L (ref 38–126)
Anion gap: 12 (ref 5–15)
BUN: 32 mg/dL — ABNORMAL HIGH (ref 6–20)
CO2: 18 mmol/L — ABNORMAL LOW (ref 22–32)
Calcium: 8.4 mg/dL — ABNORMAL LOW (ref 8.9–10.3)
Chloride: 103 mmol/L (ref 98–111)
Creatinine, Ser: 1.41 mg/dL — ABNORMAL HIGH (ref 0.61–1.24)
GFR calc Af Amer: >60 mL/min (ref >60)
GFR calc non Af Amer: 57 mL/min — ABNORMAL LOW (ref >60)
Glucose, Bld: 287 mg/dL — ABNORMAL HIGH (ref 70–99)
Potassium: 4.4 mmol/L (ref 3.5–5.1)
Sodium: 133 mmol/L — ABNORMAL LOW (ref 135–145)
Total Bilirubin: 0.8 mg/dL (ref 0.3–1.2)
Total Protein: 6.9 g/dL (ref 6.5–8.1)

## 2019-07-19 LAB — SAMPLE TO BLOOD BANK

## 2019-07-19 LAB — GLUCOSE, CAPILLARY
Glucose-Capillary: 245 mg/dL — ABNORMAL HIGH (ref 70–99)
Glucose-Capillary: 248 mg/dL — ABNORMAL HIGH (ref 70–99)
Glucose-Capillary: 276 mg/dL — ABNORMAL HIGH (ref 70–99)
Glucose-Capillary: 292 mg/dL — ABNORMAL HIGH (ref 70–99)

## 2019-07-19 LAB — D-DIMER, QUANTITATIVE: D-Dimer, Quant: 0.76 ug/mL-FEU — ABNORMAL HIGH (ref 0.00–0.50)

## 2019-07-19 LAB — BRAIN NATRIURETIC PEPTIDE: B Natriuretic Peptide: 181 pg/mL — ABNORMAL HIGH (ref 0.0–100.0)

## 2019-07-19 LAB — C-REACTIVE PROTEIN: CRP: 7.9 mg/dL — ABNORMAL HIGH (ref <1.0)

## 2019-07-19 MED ORDER — INSULIN GLARGINE 100 UNIT/ML ~~LOC~~ SOLN
15.0000 [IU] | Freq: Every day | SUBCUTANEOUS | Status: DC
  Administered 2019-07-19: 15 [IU] via SUBCUTANEOUS
  Filled 2019-07-19 (×2): qty 0.15

## 2019-07-19 MED ORDER — INSULIN ASPART 100 UNIT/ML ~~LOC~~ SOLN
2.0000 [IU] | Freq: Three times a day (TID) | SUBCUTANEOUS | Status: DC
  Administered 2019-07-20 (×2): 2 [IU] via SUBCUTANEOUS

## 2019-07-19 MED ORDER — ORAL CARE MOUTH RINSE
15.0000 mL | Freq: Two times a day (BID) | OROMUCOSAL | Status: DC
  Administered 2019-07-19 – 2019-07-23 (×8): 15 mL via OROMUCOSAL

## 2019-07-19 MED ORDER — ENOXAPARIN SODIUM 60 MG/0.6ML ~~LOC~~ SOLN
60.0000 mg | SUBCUTANEOUS | Status: DC
  Administered 2019-07-19 – 2019-07-22 (×4): 60 mg via SUBCUTANEOUS
  Filled 2019-07-19 (×4): qty 0.6

## 2019-07-19 NOTE — Progress Notes (Signed)
Inpatient Diabetes Program Recommendations  AACE/ADA: New Consensus Statement on Inpatient Glycemic Control (2015)  Target Ranges:  Prepandial:   less than 140 mg/dL      Peak postprandial:   less than 180 mg/dL (1-2 hours)      Critically ill patients:  140 - 180 mg/dL   Lab Results  Component Value Date   GLUCAP 276 (H) 07/19/2019   HGBA1C 10.1 (H) 07/18/2019    Review of Glycemic Control Results for WARN, KAJIWARA (MRN [id number]) as of 07/19/2019 16:58  Ref. Range 07/18/2019 21:14 07/19/2019 07:32 07/19/2019 12:16 07/19/2019 16:34  Glucose-Capillary Latest Ref Range: 70 - 99 mg/dL 183 (H) 245 (H) 248 (H) 276 (H)   Diabetes history: DM 2 Outpatient Diabetes medications:  Amaryl 2 mg daily, Lantus 10 units daily Current orders for Inpatient glycemic control: Lantus 10 units daily, Novolog sensitive tid with meals and HS Inpatient Diabetes Program Recommendations:    Please increase Lantus to 20 units daily and add Novolog meal coverage 4 units tid with meals.   Thanks  Adah Perl, RN, BC-ADM Inpatient Diabetes Coordinator Pager [phone] (8a-5p)

## 2019-07-19 NOTE — Progress Notes (Signed)
PROGRESS NOTE  Colton Porter [id number] DOB: Jun 20, 1966 DOA: 07/18/2019 PCP: Ria Bush, MD   LOS: 1 day   Brief Narrative / Interim history: 54 yo M with history of HTN, HLD, GERD, anemia, trombocytopenia, chronic dCHF, DM2, ETOH cirrhosis s/p liver transplant in 2017, CKD 3b with baseline Cr 1.4-1.7, came to ED with progressive weakness, dyspnea. He was seen in the ED a day prior with a fall and was diagnosed with 11th rib fracture but dc home. He returned a day later with possible seizure activity, dyspnea and hypoxia. He was febrile in the ER and 87% on RA, lung images with pneumonia and was admitted to the hospital.   Subjective / 24h Interval events: He states that he feels well this morning, breathing has gotten a little bit better.  No chest pain, no abdominal pain, no nausea or vomiting  Assessment & Plan:  Principal Problem Acute Hypoxic Respiratory Failure due to Covid-19 Viral Illness -initial Covid 19 diagnosis was on 07/15/19, and patient experienced progressive shortness of breath since, now developing Covid 19 pneumonia -started on Remdesivir 1/21 >> plan to finish 1/25 -started on steroids 1/21 >>   COVID-19 Labs  Recent Labs    07/18/19 1137  DDIMER 0.90*  FERRITIN 290  LDH 182  CRP 8.2*    Lab Results  Component Value Date   SARSCOV2NAA POSITIVE (A) 07/15/2019   Active Problems Essential hypertension -He was hypotensive on presentation with blood pressure 90/48, at home he is on amlodipine 10 mg daily, furosemide 40 mg daily, losartan 50 mg daily, these have been now on hold and his blood pressure is stable -Continue to monitor and resume antihypertensives as clinically indicated  Hyponatremia -Likely in the setting of poor p.o. intake, received saline, repeat sodium levels  Anemia/thrombocytopenia -Chronic, of chronic disease/liver disease, no bleeding, monitor  Leukopenia -Due to Covid  Chronic diastolic CHF -Echo in 0000000 showed EF  55-60% and grade 2 diastolic dysfunction -Hold home furosemide due to hypotension -Stop fluids today and encourage p.o. intake  Type 2 diabetes mellitus -A1c 7.7 in October 2020, continue Lantus as well as sliding scale  CBG (last 3)  Recent Labs    07/18/19 1707 07/18/19 1841 07/18/19 2114  GLUCAP 185* 176* 183*   History of liver transplant, prior EtOH/fatty liver disease -Received transplant in 2017 -On tacrolimus  Chronic kidney disease stage IIIa -Baseline creatinine ranging between 1.2-1.6, creatinine on presentation 1.7.  Repeat in the morning  BPH -Holding home Cardura secondary to mild hypotension on presentation  OSA  -We will avoid CPAP given positive Covid infection  Scheduled Meds: . vitamin C  500 mg Oral Daily  . aspirin EC  81 mg Oral Daily  . dexamethasone (DECADRON) injection  6 mg Intravenous Q24H  . enoxaparin (LOVENOX) injection  60 mg Subcutaneous Q24H  . escitalopram  10 mg Oral QHS  . insulin aspart  0-5 Units Subcutaneous QHS  . insulin aspart  0-9 Units Subcutaneous TID WC  . insulin glargine  10 Units Subcutaneous QHS  . Ipratropium-Albuterol  1 puff Inhalation Q6H  . lidocaine  1 patch Transdermal Q24H  . loratadine  10 mg Oral Daily  . pantoprazole  80 mg Oral Daily  . tacrolimus  1 mg Oral q1800  . tacrolimus  2 mg Oral Daily  . zinc sulfate  220 mg Oral Daily   Continuous Infusions: . sodium chloride Stopped (07/18/19 1809)  . remdesivir 100 mg in NS 100 mL  PRN Meds:.acetaminophen **OR** acetaminophen, guaiFENesin-dextromethorphan, oxyCODONE, polyethylene glycol  DVT prophylaxis: Lovenox Code Status: Full code Family Communication: d/w patient, will update wife in the afternoon Patient admitted from: home Anticipated d/c place: home Barriers to d/c: hypoxia, ongoing needs for IV therapies  Consultants:  none  Procedures:  none  Microbiology: none  Antimicrobials: none   Objective: Vitals:   07/18/19 2108  07/18/19 2345 07/19/19 0022 07/19/19 0341  BP: (!) 111/59 (!) 117/56 116/62 122/62  Pulse: 78 79 75 69  Resp: (!) 22 (!) 22 (!) 24 (!) 22  Temp: 99.9 F (37.7 C) 99.8 F (37.7 C) 99.4 F (37.4 C) 98.4 F (36.9 C)  TempSrc: Oral Oral Oral Oral  SpO2: 93% 92% 93% 94%  Weight:      Height:        Intake/Output Summary (Last 24 hours) at 07/19/2019 0416 Last data filed at 07/18/2019 2346 Gross per 24 hour  Intake --  Output 500 ml  Net -500 ml   Filed Weights   07/18/19 1117  Weight: 127 kg    Examination:  Constitutional: NAD Eyes: no scleral icterus ENMT: Mucous membranes are moist.  Neck: normal, supple Respiratory: Faint bibasilar rhonchi, no wheezing, no crackles.  Normal respiratory effort Cardiovascular: Regular rate and rhythm, no murmurs / rubs / gallops. No LE edema. Good peripheral pulses Abdomen: non distended, no tenderness. Bowel sounds positive.  Scar tissue from prior surgery present Musculoskeletal: no clubbing / cyanosis.  Skin: no rashes Neurologic: Nonfocal, equal strength Psychiatric: Normal judgment and insight. Alert and oriented x 3. Normal mood.   Data Reviewed: I have independently reviewed following labs and imaging studies   CBC: Recent Labs  Lab 07/12/19 1844 07/15/19 1211 07/17/19 1406 07/18/19 1137  WBC 6.9 4.0 2.9* 2.8*  NEUTROABS  --  2.8 2.0 2.1  HGB 13.0 13.4 12.9* 11.9*  HCT 39.4 40.1 38.6* 36.0*  MCV 85.8 85.9 85.2 85.7  PLT 151 121* 94* 93*   Basic Metabolic Panel: Recent Labs  Lab 07/12/19 1844 07/15/19 1211 07/17/19 1133 07/18/19 1137  NA 133* 135 132* 133*  K 4.1 4.0 3.9 3.7  CL 98 99 99 101  CO2 26 24 19* 20*  GLUCOSE 332* 206* 188* 183*  BUN 23* 24* 23* 29*  CREATININE 1.96* 1.84* 1.72* 1.75*  CALCIUM 8.8* 9.0 8.6* 8.1*   GFR: Estimated Creatinine Clearance: 62.2 mL/min (A) (by C-G formula based on SCr of 1.75 mg/dL (H)). Liver Function Tests: Recent Labs  Lab 07/12/19 1844 07/15/19 1211 07/17/19 1133  07/18/19 1137  AST 27 35 42* 40  ALT 39 45* 43 43  ALKPHOS 88 72 62 56  BILITOT 1.3* 1.1 1.0 0.6  PROT 7.9 7.5 7.1 7.1  ALBUMIN 4.2 3.5 3.3* 3.2*   No results for input(s): LIPASE, AMYLASE in the last 168 hours. No results for input(s): AMMONIA in the last 168 hours. Coagulation Profile: Recent Labs  Lab 07/15/19 1211  INR 1.0   Cardiac Enzymes: No results for input(s): CKTOTAL, CKMB, CKMBINDEX, TROPONINI in the last 168 hours. BNP (last 3 results) No results for input(s): PROBNP in the last 8760 hours. HbA1C: Recent Labs    07/18/19 1133  HGBA1C 10.1*   CBG: Recent Labs  Lab 07/18/19 1707 07/18/19 1841 07/18/19 2114  GLUCAP 185* 176* 183*   Lipid Profile: Recent Labs    07/18/19 1137  TRIG 128   Thyroid Function Tests: Recent Labs    07/18/19 1133  TSH 0.716   Anemia Panel:  Recent Labs    07/18/19 1137  FERRITIN 290   Urine analysis:    Component Value Date/Time   COLORURINE AMBER (A) 07/15/2019 1910   APPEARANCEUR HAZY (A) 07/15/2019 1910   LABSPEC 1.017 07/15/2019 1910   PHURINE 5.0 07/15/2019 1910   GLUCOSEU NEGATIVE 07/15/2019 1910   HGBUR NEGATIVE 07/15/2019 1910   BILIRUBINUR NEGATIVE 07/15/2019 1910   BILIRUBINUR NEG 01/19/2017 1059   KETONESUR NEGATIVE 07/15/2019 1910   PROTEINUR 30 (A) 07/15/2019 1910   UROBILINOGEN 0.2 01/19/2017 1059   UROBILINOGEN 1.0 11/21/2014 1655   NITRITE NEGATIVE 07/15/2019 1910   LEUKOCYTESUR NEGATIVE 07/15/2019 1910   Sepsis Labs: Invalid input(s): PROCALCITONIN, LACTICIDVEN  Recent Results (from the past 240 hour(s))  Respiratory Panel by RT PCR (Flu A&B, Covid) - Nasopharyngeal Swab     Status: Abnormal   Collection Time: 07/15/19  7:15 PM   Specimen: Nasopharyngeal Swab  Result Value Ref Range Status   SARS Coronavirus 2 by RT PCR POSITIVE (A) NEGATIVE Final    Comment: RESULT CALLED TO, READ BACK BY AND VERIFIED WITH: EMAIL L BERDIK 07/15/19 2110 JDW (NOTE) SARS-CoV-2 target nucleic acids are  DETECTED. SARS-CoV-2 RNA is generally detectable in upper respiratory specimens  during the acute phase of infection. Positive results are indicative of the presence of the identified virus, but do not rule out bacterial infection or co-infection with other pathogens not detected by the test. Clinical correlation with patient history and other diagnostic information is necessary to determine patient infection status. The expected result is Negative. Fact Sheet for Patients:  PinkCheek.be Fact Sheet for Healthcare Providers: GravelBags.it This test is not yet approved or cleared by the [place] FDA and  has been authorized for detection and/or diagnosis of SARS-CoV-2 by FDA under an Emergency Use Authorization (EUA).  This EUA will remain in effect (meaning this test can be used) for t he duration of  the COVID-19 declaration under Section 564(b)(1) of the Act, 21 U.S.C. section 360bbb-3(b)(1), unless the authorization is terminated or revoked sooner.    Influenza A by PCR NEGATIVE NEGATIVE Final   Influenza B by PCR NEGATIVE NEGATIVE Final    Comment: (NOTE) The Xpert Xpress SARS-CoV-2/FLU/RSV assay is intended as an aid in  the diagnosis of influenza from Nasopharyngeal swab specimens and  should not be used as a sole basis for treatment. Nasal washings and  aspirates are unacceptable for Xpert Xpress SARS-CoV-2/FLU/RSV  testing. Fact Sheet for Patients: PinkCheek.be Fact Sheet for Healthcare Providers: GravelBags.it This test is not yet approved or cleared by the [place] FDA and  has been authorized for detection and/or diagnosis of SARS-CoV-2 by  FDA under an Emergency Use Authorization (EUA). This EUA will remain  in effect (meaning this test can be used) for the duration of the  Covid-19 declaration under Section 564(b)(1) of the Act, 21  U.S.C.  section 360bbb-3(b)(1), unless the authorization is  terminated or revoked. Performed at [facility], [place] [address] [place], [place] [address]       Radiology Studies: CT ABDOMEN PELVIS WO CONTRAST  Result Date: 07/17/2019 CLINICAL DATA:  Recent fall with left flank pain, initial encounter EXAM: CT ABDOMEN AND PELVIS WITHOUT CONTRAST TECHNIQUE: Multidetector CT imaging of the abdomen and pelvis was performed following the standard protocol without IV contrast. COMPARISON:  None. FINDINGS: Lower chest: Lung bases demonstrate patchy ground-glass opacities particularly in the right lower lobe consistent with the given clinical history of COVID-19 positivity. Hepatobiliary: Fatty infiltration of the  liver is noted. The gallbladder has been surgically removed. Pancreas: Unremarkable. No pancreatic ductal dilatation or surrounding inflammatory changes. Spleen: Normal in size without focal abnormality. Adrenals/Urinary Tract: Adrenal glands are within normal limits. Kidneys are well visualized bilaterally. No renal calculi or urinary tract obstructive changes are seen. The bladder is decompressed. Stomach/Bowel: Colon is well visualized and decompressed. The appendix is within normal limits. No small bowel abnormality is noted. The stomach is unremarkable. Vascular/Lymphatic: Aortic atherosclerosis. No enlarged abdominal or pelvic lymph nodes. Reproductive: Prostate is unremarkable. Other: No free fluid is noted. Some fat containing anterior abdominal wall hernias are seen to the right of the midline in area of previous surgery. Musculoskeletal: Mildly displaced fracture of the left eleventh rib is noted centrally. No other definitive rib fractures are seen. No compression deformities are noted. Degenerative changes of the lower thoracic spine are seen. IMPRESSION: Eleventh rib fracture posteriorly near the costovertebral junction without complicating factors. Patchy ground-glass opacities particularly  in the right lower lobe consistent with the given clinical history of COVID-19 positivity. No other focal abnormality is seen. Electronically Signed   By: Inez Catalina M.D.   On: 07/17/2019 13:52   CT Head Wo Contrast  Result Date: 07/18/2019 CLINICAL DATA:  Seizure.  COVID-19 positive EXAM: CT HEAD WITHOUT CONTRAST TECHNIQUE: Contiguous axial images were obtained from the base of the skull through the vertex without intravenous contrast. COMPARISON:  None. FINDINGS: Brain: Ventricles and sulci are normal in size and configuration. There is no intracranial mass, hemorrhage, extra-axial fluid collection, or midline shift. Brain parenchyma appears unremarkable. No evident acute infarct. Vascular: No hyperdense vessel. No appreciable vascular calcification. Skull: The bony calvarium appears intact. Sinuses/Orbits: There is mucosal thickening in several ethmoid air cells. There is mild rightward deviation of the nasal septum. There is a concha bullosa on the left, an anatomic variant. Orbits appear symmetric bilaterally. Other: Mastoid air cells on the left are clear. There is opacification of multiple mastoid air cells on the right. IMPRESSION: Brain parenchyma appears unremarkable.  No mass or hemorrhage. Opacification of multiple right-sided mastoid air cells. There is mucosal thickening in several ethmoid air cells. Deviated nasal septum noted. Electronically Signed   By: Lowella Grip III M.D.   On: 07/18/2019 13:08   CT L-SPINE NO CHARGE  Result Date: 07/17/2019 CLINICAL DATA:  Left flank pain.  History of liver transplant. EXAM: CT LUMBAR SPINE WITHOUT CONTRAST TECHNIQUE: Multidetector CT imaging of the lumbar spine was performed without intravenous contrast administration. Multiplanar CT image reconstructions were also generated. COMPARISON:  None. FINDINGS: Segmentation: Normal Alignment: Normal Vertebrae: Negative for lumbar fracture or mass. Paraspinal and other soft tissues: Negative for  paraspinous mass or adenopathy. Disc levels: L1-2: Mild degenerative change.  Negative for stenosis L2-3: Mild disc and mild facet degeneration.  Negative for stenosis L3-4: Mild disc and mild facet degeneration.  Negative for stenosis L4-5: Mild disc bulging and mild facet degeneration. Negative for stenosis L5-S1: Negative IMPRESSION: Mild lumbar degenerative change. No acute abnormality. Negative for neural impingement or stenosis. Electronically Signed   By: Franchot Gallo M.D.   On: 07/17/2019 13:51   DG Chest Port 1 View  Result Date: 07/18/2019 CLINICAL DATA:  Seizure witnessed by family. COVID positive. Rib fracture yesterday. EXAM: PORTABLE CHEST 1 VIEW COMPARISON:  07/17/2019 FINDINGS: Lungs are adequately inflated and demonstrate subtle stable hazy perihilar opacification which may be due to edema or infection. No effusion. Mild stable cardiomegaly. Remainder the exam is unchanged. IMPRESSION: Subtle stable hazy  perihilar opacification which may be due to mild vascular congestion or infection. Mild stable cardiomegaly. Electronically Signed   By: Marin Olp M.D.   On: 07/18/2019 12:57   DG Chest Portable 1 View  Result Date: 07/17/2019 CLINICAL DATA:  Fever and shortness of breath.  COVID positive. EXAM: PORTABLE CHEST 1 VIEW COMPARISON:  Chest x-ray dated July 15, 2019. FINDINGS: The heart size and mediastinal contours are within normal limits. Normal pulmonary vascularity. Slightly increased markings at the right lung base. No pleural effusion or pneumothorax. No acute osseous abnormality. IMPRESSION: Slightly increased markings at the right lung base may reflect pneumonia. Electronically Signed   By: Titus Dubin M.D.   On: 07/17/2019 12:22    Marzetta Board, MD, PhD [facility]  Contact via  [email or web address]

## 2019-07-19 NOTE — Telephone Encounter (Signed)
Left message on vm per dpr asking pt/pt's wife, Colton Porter (on dpr), to call back with an update on pt.  Also recommended, per Dr. Darnell Level, scheduling a virtual visit early next week.

## 2019-07-20 LAB — COMPREHENSIVE METABOLIC PANEL
ALT: 34 U/L (ref 0–44)
AST: 26 U/L (ref 15–41)
Albumin: 3 g/dL — ABNORMAL LOW (ref 3.5–5.0)
Alkaline Phosphatase: 53 U/L (ref 38–126)
Anion gap: 11 (ref 5–15)
BUN: 33 mg/dL — ABNORMAL HIGH (ref 6–20)
CO2: 20 mmol/L — ABNORMAL LOW (ref 22–32)
Calcium: 8.5 mg/dL — ABNORMAL LOW (ref 8.9–10.3)
Chloride: 102 mmol/L (ref 98–111)
Creatinine, Ser: 1.37 mg/dL — ABNORMAL HIGH (ref 0.61–1.24)
GFR calc Af Amer: >60 mL/min (ref >60)
GFR calc non Af Amer: 59 mL/min — ABNORMAL LOW (ref >60)
Glucose, Bld: 331 mg/dL — ABNORMAL HIGH (ref 70–99)
Potassium: 4.5 mmol/L (ref 3.5–5.1)
Sodium: 133 mmol/L — ABNORMAL LOW (ref 135–145)
Total Bilirubin: 0.5 mg/dL (ref 0.3–1.2)
Total Protein: 6.9 g/dL (ref 6.5–8.1)

## 2019-07-20 LAB — MAGNESIUM: Magnesium: 1.9 mg/dL (ref 1.7–2.4)

## 2019-07-20 LAB — PHOSPHORUS: Phosphorus: 3 mg/dL (ref 2.5–4.6)

## 2019-07-20 LAB — GLUCOSE, CAPILLARY
Glucose-Capillary: 309 mg/dL — ABNORMAL HIGH (ref 70–99)
Glucose-Capillary: 321 mg/dL — ABNORMAL HIGH (ref 70–99)
Glucose-Capillary: 302 mg/dL — ABNORMAL HIGH (ref 70–99)
Glucose-Capillary: 272 mg/dL — ABNORMAL HIGH (ref 70–99)

## 2019-07-20 LAB — CBC
HCT: 34.5 % — ABNORMAL LOW (ref 39.0–52.0)
Hemoglobin: 11.7 g/dL — ABNORMAL LOW (ref 13.0–17.0)
MCH: 28.5 pg (ref 26.0–34.0)
MCHC: 33.9 g/dL (ref 30.0–36.0)
MCV: 84.1 fL (ref 80.0–100.0)
Platelets: 88 10*3/uL — ABNORMAL LOW (ref 150–400)
RBC: 4.1 MIL/uL — ABNORMAL LOW (ref 4.22–5.81)
RDW: 13.7 % (ref 11.5–15.5)
WBC: 2.2 10*3/uL — ABNORMAL LOW (ref 4.0–10.5)
nRBC: 0 % (ref 0.0–0.2)

## 2019-07-20 LAB — C-REACTIVE PROTEIN: CRP: 6 mg/dL — ABNORMAL HIGH (ref <1.0)

## 2019-07-20 LAB — D-DIMER, QUANTITATIVE: D-Dimer, Quant: 0.67 ug/mL-FEU — ABNORMAL HIGH (ref 0.00–0.50)

## 2019-07-20 MED ORDER — INSULIN GLARGINE 100 UNIT/ML ~~LOC~~ SOLN
20.0000 [IU] | Freq: Every day | SUBCUTANEOUS | Status: DC
  Administered 2019-07-20 – 2019-07-22 (×3): 20 [IU] via SUBCUTANEOUS
  Filled 2019-07-20 (×4): qty 0.2

## 2019-07-20 MED ORDER — INSULIN ASPART 100 UNIT/ML ~~LOC~~ SOLN
4.0000 [IU] | Freq: Three times a day (TID) | SUBCUTANEOUS | Status: DC
  Administered 2019-07-20 – 2019-07-21 (×2): 4 [IU] via SUBCUTANEOUS

## 2019-07-20 NOTE — Plan of Care (Signed)

## 2019-07-20 NOTE — Progress Notes (Signed)
RT NOTE:  Auto CPAP w/ 3L O2 setup at bedside for patient when ready. He stated he wears at home and will manage CPAP tonight. Sterile water added to humidity chamber. Mask adjusted to patient. RT available if patient needs assistance.

## 2019-07-20 NOTE — Progress Notes (Signed)
PROGRESS NOTE  BRYSUN DYLLA [id number] DOB: 07/15/1965 DOA: 07/18/2019 PCP: Ria Bush, MD   LOS: 2 days   Brief Narrative / Interim history: 54 yo M with history of HTN, HLD, GERD, anemia, trombocytopenia, chronic dCHF, DM2, ETOH cirrhosis s/p liver transplant in 2017, CKD 3b with baseline Cr 1.4-1.7, came to ED with progressive weakness, dyspnea. He was seen in the ED a day prior with a fall and was diagnosed with 11th rib fracture but dc home. He returned a day later with possible seizure activity, dyspnea and hypoxia. He was febrile in the ER and 87% on RA, lung images with pneumonia and was admitted to the hospital.   Subjective / 24h Interval events: He is asking to go home.  Bed and chair are uncomfortable.  Denies any shortness of breath.  No chest pain, no abdominal pain, no nausea or vomiting  Assessment & Plan:  Principal Problem Acute Hypoxic Respiratory Failure due to Covid-19 Viral Illness -initial Covid 19 diagnosis was on 07/15/19, and patient experienced progressive shortness of breath since, now developing Covid 19 pneumonia -started on Remdesivir 1/21 >> plan to finish 1/25 -started on steroids 1/21 >> -Clinically improving, remains stable on 2 L nasal cannula.  Inflammatory markers improving.   COVID-19 Labs  Recent Labs    07/18/19 1137 07/19/19 1237 07/20/19 0035  DDIMER 0.90* 0.76* 0.67*  FERRITIN 290  --   --   LDH 182  --   --   CRP 8.2* 7.9* 6.0*    Lab Results  Component Value Date   SARSCOV2NAA POSITIVE (A) 07/15/2019   Active Problems Essential hypertension -He was hypotensive on presentation with blood pressure 90/48, at home he is on amlodipine 10 mg daily, furosemide 40 mg daily, losartan 50 mg daily, these have been now on hold and his blood pressure is stable -Blood pressure has remained stable, continue to hold home medications  Hyponatremia -Likely in the setting of poor p.o. intake, received saline, repeat sodium levels  stable  Anemia/thrombocytopenia -Chronic, of chronic disease/liver disease, no bleeding, monitor.  Hemoglobin and platelets are stable today  Leukopenia -Due to Covid  Chronic diastolic CHF -Echo in 0000000 showed EF 55-60% and grade 2 diastolic dysfunction -Hold home furosemide due to hypotension -Stop fluids today and encourage p.o. intake  Type 2 diabetes mellitus -A1c 7.7 in October 2020, continue Lantus as well as sliding scale  CBG (last 3)  Recent Labs    07/19/19 1634 07/19/19 2129 07/20/19 0725  GLUCAP 276* 292* 309*   History of liver transplant, prior EtOH/fatty liver disease -Received transplant in 2017 -On tacrolimus -Liver enzymes remain stable, continue to closely monitor  Chronic kidney disease stage IIIa -Baseline creatinine ranging between 1.2-1.6, creatinine on presentation 1.7.   -Creatinine has improved and currently at baseline at 1.37  BPH -Holding home Cardura secondary to mild hypotension on presentation  OSA  -We will avoid CPAP given positive Covid infection  Scheduled Meds: . vitamin C  500 mg Oral Daily  . aspirin EC  81 mg Oral Daily  . dexamethasone (DECADRON) injection  6 mg Intravenous Q24H  . enoxaparin (LOVENOX) injection  60 mg Subcutaneous Q24H  . escitalopram  10 mg Oral QHS  . insulin aspart  0-5 Units Subcutaneous QHS  . insulin aspart  0-9 Units Subcutaneous TID WC  . insulin aspart  2 Units Subcutaneous TID WC  . insulin glargine  15 Units Subcutaneous QHS  . Ipratropium-Albuterol  1 puff Inhalation Q6H  .  lidocaine  1 patch Transdermal Q24H  . loratadine  10 mg Oral Daily  . mouth rinse  15 mL Mouth Rinse BID  . pantoprazole  80 mg Oral Daily  . tacrolimus  1 mg Oral q1800  . tacrolimus  2 mg Oral Daily  . zinc sulfate  220 mg Oral Daily   Continuous Infusions: . remdesivir 100 mg in NS 100 mL 100 mg (07/20/19 0937)   PRN Meds:.acetaminophen **OR** acetaminophen, guaiFENesin-dextromethorphan, oxyCODONE,  polyethylene glycol  DVT prophylaxis: Lovenox Code Status: Full code Family Communication: Updated wife Deveron Longnecker [phone] Patient admitted from: home Anticipated d/c place: home Barriers to d/c: hypoxia, ongoing needs for IV therapies  Consultants:  none  Procedures:  none  Microbiology: none  Antimicrobials: none   Objective: Vitals:   07/19/19 2008 07/20/19 0411 07/20/19 0749 07/20/19 1112  BP: 126/72 120/89 122/75 128/76  Pulse: 66 67 (!) 59 60  Resp: 18 (!) 25 16 20   Temp: 98.9 F (37.2 C) 98.7 F (37.1 C) 97.9 F (36.6 C) 97.9 F (36.6 C)  TempSrc: Oral Oral Oral Oral  SpO2: 95% 92% 93% 92%  Weight:      Height:        Intake/Output Summary (Last 24 hours) at 07/20/2019 1157 Last data filed at 07/20/2019 [id number] Gross per 24 hour  Intake 240 ml  Output 1780 ml  Net -1540 ml   Filed Weights   07/18/19 1117  Weight: 127 kg    Examination:  Constitutional: no distress  Eyes: no icterus ENMT: mmm Neck: normal, supple Respiratory: faint rhonchi, no wheezing, moves air well Cardiovascular: Regular rate and rhythm, no murmurs, no peripheral edema Abdomen: Soft, nontender, nondistended, bowel sounds positive, scar tissue from prior surgery present Musculoskeletal: no clubbing / cyanosis.  Skin: No new rashes Neurologic: No focal deficits, equal strength  Data Reviewed: I have independently reviewed following labs and imaging studies   CBC: Recent Labs  Lab 07/15/19 1211 07/17/19 1406 07/18/19 1137 07/19/19 1237 07/20/19 0035  WBC 4.0 2.9* 2.8* 2.4* 2.2*  NEUTROABS 2.8 2.0 2.1  --   --   HGB 13.4 12.9* 11.9* 11.4* 11.7*  HCT 40.1 38.6* 36.0* 33.9* 34.5*  MCV 85.9 85.2 85.7 83.9 84.1  PLT 121* 94* 93* 87* 88*   Basic Metabolic Panel: Recent Labs  Lab 07/15/19 1211 07/17/19 1133 07/18/19 1137 07/19/19 1237 07/20/19 0035  NA 135 132* 133* 133* 133*  K 4.0 3.9 3.7 4.4 4.5  CL 99 99 101 103 102  CO2 24 19* 20* 18* 20*  GLUCOSE  206* 188* 183* 287* 331*  BUN 24* 23* 29* 32* 33*  CREATININE 1.84* 1.72* 1.75* 1.41* 1.37*  CALCIUM 9.0 8.6* 8.1* 8.4* 8.5*  MG  --   --   --   --  1.9  PHOS  --   --   --   --  3.0   GFR: Estimated Creatinine Clearance: 79.5 mL/min (A) (by C-G formula based on SCr of 1.37 mg/dL (H)). Liver Function Tests: Recent Labs  Lab 07/15/19 1211 07/17/19 1133 07/18/19 1137 07/19/19 1237 07/20/19 0035  AST 35 42* 40 30 26  ALT 45* 43 43 37 34  ALKPHOS 72 62 56 49 53  BILITOT 1.1 1.0 0.6 0.8 0.5  PROT 7.5 7.1 7.1 6.9 6.9  ALBUMIN 3.5 3.3* 3.2* 2.9* 3.0*   No results for input(s): LIPASE, AMYLASE in the last 168 hours. No results for input(s): AMMONIA in the last 168 hours. Coagulation Profile: Recent  Labs  Lab 07/15/19 1211  INR 1.0   Cardiac Enzymes: No results for input(s): CKTOTAL, CKMB, CKMBINDEX, TROPONINI in the last 168 hours. BNP (last 3 results) No results for input(s): PROBNP in the last 8760 hours. HbA1C: Recent Labs    07/18/19 1133  HGBA1C 10.1*   CBG: Recent Labs  Lab 07/19/19 0732 07/19/19 1216 07/19/19 1634 07/19/19 2129 07/20/19 0725  GLUCAP 245* 248* 276* 292* 309*   Lipid Profile: Recent Labs    07/18/19 1137  TRIG 128   Thyroid Function Tests: Recent Labs    07/18/19 1133  TSH 0.716   Anemia Panel: Recent Labs    07/18/19 1137  FERRITIN 290   Urine analysis:    Component Value Date/Time   COLORURINE AMBER (A) 07/15/2019 1910   APPEARANCEUR HAZY (A) 07/15/2019 1910   LABSPEC 1.017 07/15/2019 1910   PHURINE 5.0 07/15/2019 1910   GLUCOSEU NEGATIVE 07/15/2019 1910   HGBUR NEGATIVE 07/15/2019 1910   BILIRUBINUR NEGATIVE 07/15/2019 1910   BILIRUBINUR NEG 01/19/2017 1059   KETONESUR NEGATIVE 07/15/2019 1910   PROTEINUR 30 (A) 07/15/2019 1910   UROBILINOGEN 0.2 01/19/2017 1059   UROBILINOGEN 1.0 11/21/2014 1655   NITRITE NEGATIVE 07/15/2019 1910   LEUKOCYTESUR NEGATIVE 07/15/2019 1910   Sepsis Labs: Invalid input(s):  PROCALCITONIN, LACTICIDVEN  Recent Results (from the past 240 hour(s))  Respiratory Panel by RT PCR (Flu A&B, Covid) - Nasopharyngeal Swab     Status: Abnormal   Collection Time: 07/15/19  7:15 PM   Specimen: Nasopharyngeal Swab  Result Value Ref Range Status   SARS Coronavirus 2 by RT PCR POSITIVE (A) NEGATIVE Final    Comment: RESULT CALLED TO, READ BACK BY AND VERIFIED WITH: EMAIL L BERDIK 07/15/19 2110 JDW (NOTE) SARS-CoV-2 target nucleic acids are DETECTED. SARS-CoV-2 RNA is generally detectable in upper respiratory specimens  during the acute phase of infection. Positive results are indicative of the presence of the identified virus, but do not rule out bacterial infection or co-infection with other pathogens not detected by the test. Clinical correlation with patient history and other diagnostic information is necessary to determine patient infection status. The expected result is Negative. Fact Sheet for Patients:  PinkCheek.be Fact Sheet for Healthcare Providers: GravelBags.it This test is not yet approved or cleared by the [place] FDA and  has been authorized for detection and/or diagnosis of SARS-CoV-2 by FDA under an Emergency Use Authorization (EUA).  This EUA will remain in effect (meaning this test can be used) for t he duration of  the COVID-19 declaration under Section 564(b)(1) of the Act, 21 U.S.C. section 360bbb-3(b)(1), unless the authorization is terminated or revoked sooner.    Influenza A by PCR NEGATIVE NEGATIVE Final   Influenza B by PCR NEGATIVE NEGATIVE Final    Comment: (NOTE) The Xpert Xpress SARS-CoV-2/FLU/RSV assay is intended as an aid in  the diagnosis of influenza from Nasopharyngeal swab specimens and  should not be used as a sole basis for treatment. Nasal washings and  aspirates are unacceptable for Xpert Xpress SARS-CoV-2/FLU/RSV  testing. Fact Sheet for  Patients: PinkCheek.be Fact Sheet for Healthcare Providers: GravelBags.it This test is not yet approved or cleared by the [place] FDA and  has been authorized for detection and/or diagnosis of SARS-CoV-2 by  FDA under an Emergency Use Authorization (EUA). This EUA will remain  in effect (meaning this test can be used) for the duration of the  Covid-19 declaration under Section 564(b)(1) of the Act, 21  U.S.C. section  360bbb-3(b)(1), unless the authorization is  terminated or revoked. Performed at [facility], [place] [address], [place], [place] [address]   Blood Culture (routine x 2)     Status: None (Preliminary result)   Collection Time: 07/18/19 11:37 AM   Specimen: BLOOD  Result Value Ref Range Status   Specimen Description   Final    BLOOD RIGHT ANTECUBITAL Performed at [facility] [address] [place], [place] [address]    Special Requests   Final    BOTTLES DRAWN AEROBIC AND ANAEROBIC Blood Culture adequate volume Performed at [facility] [address] [place], [place] [address]    Culture   Final    NO GROWTH 2 DAYS Performed at [facility] [address], [place], [place] [address]    Report Status PENDING  Incomplete  Blood Culture (routine x 2)     Status: None (Preliminary result)   Collection Time: 07/18/19 11:42 AM   Specimen: BLOOD RIGHT HAND  Result Value Ref Range Status   Specimen Description   Final    BLOOD RIGHT HAND Performed at [facility] [address] [place], [place] [address]    Special Requests   Final    Blood Culture adequate volume Performed at [facility] [address] [place], [place] [address]    Culture   Final    NO GROWTH 2 DAYS Performed at [facility] [address], [place], [place] [address]    Report Status PENDING  Incomplete      Radiology  Studies: CT Head Wo Contrast  Result Date: 07/18/2019 CLINICAL DATA:  Seizure.  COVID-19 positive EXAM: CT HEAD WITHOUT CONTRAST TECHNIQUE: Contiguous axial images were obtained from the base of the skull through the vertex without intravenous contrast. COMPARISON:  None. FINDINGS: Brain: Ventricles and sulci are normal in size and configuration. There is no intracranial mass, hemorrhage, extra-axial fluid collection, or midline shift. Brain parenchyma appears unremarkable. No evident acute infarct. Vascular: No hyperdense vessel. No appreciable vascular calcification. Skull: The bony calvarium appears intact. Sinuses/Orbits: There is mucosal thickening in several ethmoid air cells. There is mild rightward deviation of the nasal septum. There is a concha bullosa on the left, an anatomic variant. Orbits appear symmetric bilaterally. Other: Mastoid air cells on the left are clear. There is opacification of multiple mastoid air cells on the right. IMPRESSION: Brain parenchyma appears unremarkable.  No mass or hemorrhage. Opacification of multiple right-sided mastoid air cells. There is mucosal thickening in several ethmoid air cells. Deviated nasal septum noted. Electronically Signed   By: Lowella Grip III M.D.   On: 07/18/2019 13:08   DG Chest Port 1 View  Result Date: 07/18/2019 CLINICAL DATA:  Seizure witnessed by family. COVID positive. Rib fracture yesterday. EXAM: PORTABLE CHEST 1 VIEW COMPARISON:  07/17/2019 FINDINGS: Lungs are adequately inflated and demonstrate subtle stable hazy perihilar opacification which may be due to edema or infection. No effusion. Mild stable cardiomegaly. Remainder the exam is unchanged. IMPRESSION: Subtle stable hazy perihilar opacification which may be due to mild vascular congestion or infection. Mild stable cardiomegaly. Electronically Signed   By: Marin Olp M.D.   On: 07/18/2019 12:57    Marzetta Board, MD, PhD [facility]  Contact via   [email or web address]

## 2019-07-21 ENCOUNTER — Inpatient Hospital Stay (HOSPITAL_COMMUNITY): Payer: Medicare HMO

## 2019-07-21 ENCOUNTER — Other Ambulatory Visit: Payer: Self-pay | Admitting: Family Medicine

## 2019-07-21 LAB — COMPREHENSIVE METABOLIC PANEL
ALT: 29 U/L (ref 0–44)
AST: 18 U/L (ref 15–41)
Albumin: 3.1 g/dL — ABNORMAL LOW (ref 3.5–5.0)
Alkaline Phosphatase: 54 U/L (ref 38–126)
Anion gap: 10 (ref 5–15)
BUN: 33 mg/dL — ABNORMAL HIGH (ref 6–20)
CO2: 21 mmol/L — ABNORMAL LOW (ref 22–32)
Calcium: 8.7 mg/dL — ABNORMAL LOW (ref 8.9–10.3)
Chloride: 104 mmol/L (ref 98–111)
Creatinine, Ser: 1.26 mg/dL — ABNORMAL HIGH (ref 0.61–1.24)
GFR calc Af Amer: >60 mL/min (ref >60)
GFR calc non Af Amer: >60 mL/min (ref >60)
Glucose, Bld: 318 mg/dL — ABNORMAL HIGH (ref 70–99)
Potassium: 5.1 mmol/L (ref 3.5–5.1)
Sodium: 135 mmol/L (ref 135–145)
Total Bilirubin: 0.9 mg/dL (ref 0.3–1.2)
Total Protein: 7.1 g/dL (ref 6.5–8.1)

## 2019-07-21 LAB — CBC
HCT: 37.3 % — ABNORMAL LOW (ref 39.0–52.0)
Hemoglobin: 12.2 g/dL — ABNORMAL LOW (ref 13.0–17.0)
MCH: 28 pg (ref 26.0–34.0)
MCHC: 32.7 g/dL (ref 30.0–36.0)
MCV: 85.6 fL (ref 80.0–100.0)
Platelets: 106 10*3/uL — ABNORMAL LOW (ref 150–400)
RBC: 4.36 MIL/uL (ref 4.22–5.81)
RDW: 13.7 % (ref 11.5–15.5)
WBC: 2.7 10*3/uL — ABNORMAL LOW (ref 4.0–10.5)
nRBC: 0 % (ref 0.0–0.2)

## 2019-07-21 LAB — GLUCOSE, CAPILLARY
Glucose-Capillary: 289 mg/dL — ABNORMAL HIGH (ref 70–99)
Glucose-Capillary: 305 mg/dL — ABNORMAL HIGH (ref 70–99)
Glucose-Capillary: 251 mg/dL — ABNORMAL HIGH (ref 70–99)
Glucose-Capillary: 175 mg/dL — ABNORMAL HIGH (ref 70–99)

## 2019-07-21 LAB — D-DIMER, QUANTITATIVE: D-Dimer, Quant: 0.42 ug/mL-FEU (ref 0.00–0.50)

## 2019-07-21 LAB — C-REACTIVE PROTEIN: CRP: 2.7 mg/dL — ABNORMAL HIGH (ref <1.0)

## 2019-07-21 MED ORDER — FUROSEMIDE 10 MG/ML IJ SOLN
40.0000 mg | Freq: Once | INTRAMUSCULAR | Status: AC
  Administered 2019-07-21: 40 mg via INTRAVENOUS
  Filled 2019-07-21: qty 4

## 2019-07-21 MED ORDER — INSULIN ASPART 100 UNIT/ML ~~LOC~~ SOLN
7.0000 [IU] | Freq: Three times a day (TID) | SUBCUTANEOUS | Status: DC
  Administered 2019-07-21 – 2019-07-23 (×7): 7 [IU] via SUBCUTANEOUS

## 2019-07-21 NOTE — Progress Notes (Signed)
PROGRESS NOTE  Colton Porter [id number] DOB: 17-Sep-1965 DOA: 07/18/2019 PCP: Ria Bush, MD   LOS: 3 days   Brief Narrative / Interim history: 54 yo M with history of HTN, HLD, GERD, anemia, trombocytopenia, chronic dCHF, DM2, ETOH cirrhosis s/p liver transplant in 2017, CKD 3b with baseline Cr 1.4-1.7, came to ED with progressive weakness, dyspnea. He was seen in the ED a day prior with a fall and was diagnosed with 11th rib fracture but dc home. He returned a day later with possible seizure activity, dyspnea and hypoxia. He was febrile in the ER and 87% on RA, lung images with pneumonia and was admitted to the hospital.   Subjective / 24h Interval events: Doing well this morning, no shortness of breath, no chest pain, no nausea or vomiting  Assessment & Plan:  Principal Problem Acute Hypoxic Respiratory Failure due to Covid-19 Viral Illness -initial Covid 19 diagnosis was on 07/15/19, and patient experienced progressive shortness of breath since, now developing Covid 19 pneumonia -started on Remdesivir 1/21 >> plan to finish 1/25 -started on steroids 1/21 >> -Clinically improving, remains stable on 2 L nasal cannula and will attempt to wean to room air today -Repeat chest x-ray today with slight worsening of the infiltrates.  Due to possibility of small fluid overload will give Lasix x1   COVID-19 Labs  Recent Labs    07/19/19 1237 07/20/19 0035 07/21/19 0635  DDIMER 0.76* 0.67* 0.42  CRP 7.9* 6.0* 2.7*    Lab Results  Component Value Date   SARSCOV2NAA POSITIVE (A) 07/15/2019   Active Problems Essential hypertension -He was hypotensive on presentation with blood pressure 90/48, at home he is on amlodipine 10 mg daily, furosemide 40 mg daily, losartan 50 mg daily, these have been now on hold and his blood pressure is stable -Blood pressure has remained stable, continue to hold home medications.  Will give Lasix x1 today  Hyponatremia -Likely in the setting of  poor p.o. intake, received saline, repeat sodium levels stable, now normalized  Anemia/thrombocytopenia -Chronic, of chronic disease/liver disease, no bleeding, monitor.  Hemoglobin and platelets are stable today  Leukopenia -Due to Covid  Chronic diastolic CHF -Echo in 0000000 showed EF 55-60% and grade 2 diastolic dysfunction -Resume furosemide today, will give IV  Type 2 diabetes mellitus -A1c 7.7 in October 2020, but now 10.1 -Patient apparently does not check his CBGs at home, strongly advised to check CBGs at least 3 4 times daily  CBG (last 3)  Recent Labs    07/20/19 2033 07/21/19 0725 07/21/19 1148  GLUCAP 272* 289* 305*   History of liver transplant, prior EtOH/fatty liver disease -Received transplant in 2017 -On tacrolimus -LFTs remain normal  Chronic kidney disease stage IIIa -Baseline creatinine ranging between 1.2-1.6, creatinine on presentation 1.7.   -Creatinine has improved and currently at baseline at 1.37  BPH -Holding home Cardura secondary to mild hypotension on presentation  OSA  -We will avoid CPAP given positive Covid infection  Scheduled Meds: . vitamin C  500 mg Oral Daily  . aspirin EC  81 mg Oral Daily  . dexamethasone (DECADRON) injection  6 mg Intravenous Q24H  . enoxaparin (LOVENOX) injection  60 mg Subcutaneous Q24H  . escitalopram  10 mg Oral QHS  . insulin aspart  0-5 Units Subcutaneous QHS  . insulin aspart  0-9 Units Subcutaneous TID WC  . insulin aspart  7 Units Subcutaneous TID WC  . insulin glargine  20 Units Subcutaneous QHS  . Ipratropium-Albuterol  1 puff Inhalation Q6H  . lidocaine  1 patch Transdermal Q24H  . loratadine  10 mg Oral Daily  . mouth rinse  15 mL Mouth Rinse BID  . pantoprazole  80 mg Oral Daily  . tacrolimus  1 mg Oral q1800  . tacrolimus  2 mg Oral Daily  . zinc sulfate  220 mg Oral Daily   Continuous Infusions: . remdesivir 100 mg in NS 100 mL 100 mg (07/21/19 0922)   PRN Meds:.acetaminophen **OR**  acetaminophen, guaiFENesin-dextromethorphan, oxyCODONE, polyethylene glycol  DVT prophylaxis: Lovenox Code Status: Full code Family Communication: Updated wife Muhanad Bjornson [phone] Patient admitted from: home Anticipated d/c place: home Barriers to d/c: hypoxia, ongoing needs for IV therapies  Consultants:  none  Procedures:  none  Microbiology: none  Antimicrobials: none   Objective: Vitals:   07/20/19 2330 07/21/19 0358 07/21/19 0723 07/21/19 1136  BP: 139/81 (!) 138/95 140/80 127/81  Pulse: 65 62 62 71  Resp: (!) 24 20 18 18   Temp: 98.2 F (36.8 C) 97.8 F (36.6 C) 97.8 F (36.6 C) 98.1 F (36.7 C)  TempSrc: Oral Oral Oral Oral  SpO2: 93% 93% 96% 93%  Weight:      Height:        Intake/Output Summary (Last 24 hours) at 07/21/2019 1429 Last data filed at 07/21/2019 [id number] Gross per 24 hour  Intake --  Output 830 ml  Net -830 ml   Filed Weights   07/18/19 1117  Weight: 127 kg    Examination:  Constitutional: NAD, eating a muffin Eyes: no icterus ENMT: mmm Neck: normal, supple Respiratory: Bibasilar rhonchi no wheezing Cardiovascular: Regular rate and rhythm, no murmurs Abdomen: Soft, NT, ND, positive bowel sounds Musculoskeletal: no clubbing / cyanosis.  Skin: No rashes seen Neurologic: No focal deficits, equal strength  Data Reviewed: I have independently reviewed following labs and imaging studies   CBC: Recent Labs  Lab 07/15/19 1211 07/15/19 1211 07/17/19 1406 07/18/19 1137 07/19/19 1237 07/20/19 0035 07/21/19 0635  WBC 4.0   < > 2.9* 2.8* 2.4* 2.2* 2.7*  NEUTROABS 2.8  --  2.0 2.1  --   --   --   HGB 13.4   < > 12.9* 11.9* 11.4* 11.7* 12.2*  HCT 40.1   < > 38.6* 36.0* 33.9* 34.5* 37.3*  MCV 85.9   < > 85.2 85.7 83.9 84.1 85.6  PLT 121*   < > 94* 93* 87* 88* 106*   < > = values in this interval not displayed.   Basic Metabolic Panel: Recent Labs  Lab 07/17/19 1133 07/18/19 1137 07/19/19 1237 07/20/19 0035 07/21/19 0635   NA 132* 133* 133* 133* 135  K 3.9 3.7 4.4 4.5 5.1  CL 99 101 103 102 104  CO2 19* 20* 18* 20* 21*  GLUCOSE 188* 183* 287* 331* 318*  BUN 23* 29* 32* 33* 33*  CREATININE 1.72* 1.75* 1.41* 1.37* 1.26*  CALCIUM 8.6* 8.1* 8.4* 8.5* 8.7*  MG  --   --   --  1.9  --   PHOS  --   --   --  3.0  --    GFR: Estimated Creatinine Clearance: 86.4 mL/min (A) (by C-G formula based on SCr of 1.26 mg/dL (H)). Liver Function Tests: Recent Labs  Lab 07/17/19 1133 07/18/19 1137 07/19/19 1237 07/20/19 0035 07/21/19 0635  AST 42* 40 30 26 18   ALT 43 43 37 34 29  ALKPHOS 62 56 49 53 54  BILITOT 1.0 0.6 0.8 0.5 0.9  PROT 7.1 7.1 6.9 6.9 7.1  ALBUMIN 3.3* 3.2* 2.9* 3.0* 3.1*   No results for input(s): LIPASE, AMYLASE in the last 168 hours. No results for input(s): AMMONIA in the last 168 hours. Coagulation Profile: Recent Labs  Lab 07/15/19 1211  INR 1.0   Cardiac Enzymes: No results for input(s): CKTOTAL, CKMB, CKMBINDEX, TROPONINI in the last 168 hours. BNP (last 3 results) No results for input(s): PROBNP in the last 8760 hours. HbA1C: No results for input(s): HGBA1C in the last 72 hours. CBG: Recent Labs  Lab 07/20/19 1146 07/20/19 1532 07/20/19 2033 07/21/19 0725 07/21/19 1148  GLUCAP 321* 302* 272* 289* 305*   Lipid Profile: No results for input(s): CHOL, HDL, LDLCALC, TRIG, CHOLHDL, LDLDIRECT in the last 72 hours. Thyroid Function Tests: No results for input(s): TSH, T4TOTAL, FREET4, T3FREE, THYROIDAB in the last 72 hours. Anemia Panel: No results for input(s): VITAMINB12, FOLATE, FERRITIN, TIBC, IRON, RETICCTPCT in the last 72 hours. Urine analysis:    Component Value Date/Time   COLORURINE AMBER (A) 07/15/2019 1910   APPEARANCEUR HAZY (A) 07/15/2019 1910   LABSPEC 1.017 07/15/2019 1910   PHURINE 5.0 07/15/2019 1910   GLUCOSEU NEGATIVE 07/15/2019 1910   HGBUR NEGATIVE 07/15/2019 1910   BILIRUBINUR NEGATIVE 07/15/2019 1910   BILIRUBINUR NEG 01/19/2017 1059    KETONESUR NEGATIVE 07/15/2019 1910   PROTEINUR 30 (A) 07/15/2019 1910   UROBILINOGEN 0.2 01/19/2017 1059   UROBILINOGEN 1.0 11/21/2014 1655   NITRITE NEGATIVE 07/15/2019 1910   LEUKOCYTESUR NEGATIVE 07/15/2019 1910   Sepsis Labs: Invalid input(s): PROCALCITONIN, LACTICIDVEN  Recent Results (from the past 240 hour(s))  Respiratory Panel by RT PCR (Flu A&B, Covid) - Nasopharyngeal Swab     Status: Abnormal   Collection Time: 07/15/19  7:15 PM   Specimen: Nasopharyngeal Swab  Result Value Ref Range Status   SARS Coronavirus 2 by RT PCR POSITIVE (A) NEGATIVE Final    Comment: RESULT CALLED TO, READ BACK BY AND VERIFIED WITH: EMAIL L BERDIK 07/15/19 2110 JDW (NOTE) SARS-CoV-2 target nucleic acids are DETECTED. SARS-CoV-2 RNA is generally detectable in upper respiratory specimens  during the acute phase of infection. Positive results are indicative of the presence of the identified virus, but do not rule out bacterial infection or co-infection with other pathogens not detected by the test. Clinical correlation with patient history and other diagnostic information is necessary to determine patient infection status. The expected result is Negative. Fact Sheet for Patients:  PinkCheek.be Fact Sheet for Healthcare Providers: GravelBags.it This test is not yet approved or cleared by the [place] FDA and  has been authorized for detection and/or diagnosis of SARS-CoV-2 by FDA under an Emergency Use Authorization (EUA).  This EUA will remain in effect (meaning this test can be used) for t he duration of  the COVID-19 declaration under Section 564(b)(1) of the Act, 21 U.S.C. section 360bbb-3(b)(1), unless the authorization is terminated or revoked sooner.    Influenza A by PCR NEGATIVE NEGATIVE Final   Influenza B by PCR NEGATIVE NEGATIVE Final    Comment: (NOTE) The Xpert Xpress SARS-CoV-2/FLU/RSV assay is intended as an aid in   the diagnosis of influenza from Nasopharyngeal swab specimens and  should not be used as a sole basis for treatment. Nasal washings and  aspirates are unacceptable for Xpert Xpress SARS-CoV-2/FLU/RSV  testing. Fact Sheet for Patients: PinkCheek.be Fact Sheet for Healthcare Providers: GravelBags.it This test is not yet approved or cleared by the [place] and  has been authorized for  detection and/or diagnosis of SARS-CoV-2 by  FDA under an Emergency Use Authorization (EUA). This EUA will remain  in effect (meaning this test can be used) for the duration of the  Covid-19 declaration under Section 564(b)(1) of the Act, 21  U.S.C. section 360bbb-3(b)(1), unless the authorization is  terminated or revoked. Performed at [facility], [place] [address] [place], [place] [address]   Blood Culture (routine x 2)     Status: None (Preliminary result)   Collection Time: 07/18/19 11:37 AM   Specimen: BLOOD  Result Value Ref Range Status   Specimen Description   Final    BLOOD RIGHT ANTECUBITAL Performed at [facility] [address], [place], [place] [address]    Special Requests   Final    BOTTLES DRAWN AEROBIC AND ANAEROBIC Blood Culture adequate volume Performed at [facility] [address] [place], [place] [address]    Culture   Final    NO GROWTH 3 DAYS Performed at [facility], [place] [address] [place], [place] [address]    Report Status PENDING  Incomplete  Blood Culture (routine x 2)     Status: None (Preliminary result)   Collection Time: 07/18/19 11:42 AM   Specimen: BLOOD RIGHT HAND  Result Value Ref Range Status   Specimen Description   Final    BLOOD RIGHT HAND Performed at [facility] [address], [place], [place] [address]    Special Requests   Final    Blood Culture adequate volume Performed at [facility] [address] [place], [place] [address]    Culture   Final    NO GROWTH 3 DAYS Performed at [facility], [place] [address], [place], [place] [address]    Report Status PENDING  Incomplete      Radiology Studies: DG CHEST PORT 1 VIEW  Result Date: 07/21/2019 CLINICAL DATA:  Hypoxemia EXAM: PORTABLE CHEST 1 VIEW COMPARISON:  07/18/2019 FINDINGS: Bilateral patchy interstitial and alveolar airspace disease which has increased compared with the prior exam. No pleural effusion or pneumothorax. Stable cardiomediastinal silhouette. No aggressive osseous lesion. IMPRESSION: Worsening bilateral interstitial and alveolar airspace disease concerning for progression of multilobar pneumonia/atypical viral pneumonia versus pulmonary edema. Electronically Signed   By: Kathreen Devoid   On: 07/21/2019 10:54    Marzetta Board, MD, PhD [facility]  Contact via  [email or web address]

## 2019-07-21 NOTE — Plan of Care (Signed)

## 2019-07-21 NOTE — Progress Notes (Signed)
RT NOTE:  Pt unable to tolerate CPAP. Pt stated he didn't sleep well last night due to mask fit and pressure. Pt does not know home settings. Pt request RT remove CPAP from room.

## 2019-07-22 LAB — CBC
HCT: 36.8 % — ABNORMAL LOW (ref 39.0–52.0)
Hemoglobin: 12.2 g/dL — ABNORMAL LOW (ref 13.0–17.0)
MCH: 28.2 pg (ref 26.0–34.0)
MCHC: 33.2 g/dL (ref 30.0–36.0)
MCV: 85 fL (ref 80.0–100.0)
Platelets: 121 10*3/uL — ABNORMAL LOW (ref 150–400)
RBC: 4.33 MIL/uL (ref 4.22–5.81)
RDW: 13.6 % (ref 11.5–15.5)
WBC: 3.5 10*3/uL — ABNORMAL LOW (ref 4.0–10.5)
nRBC: 0 % (ref 0.0–0.2)

## 2019-07-22 LAB — COMPREHENSIVE METABOLIC PANEL
ALT: 26 U/L (ref 0–44)
AST: 19 U/L (ref 15–41)
Albumin: 3.2 g/dL — ABNORMAL LOW (ref 3.5–5.0)
Alkaline Phosphatase: 58 U/L (ref 38–126)
Anion gap: 13 (ref 5–15)
BUN: 36 mg/dL — ABNORMAL HIGH (ref 6–20)
CO2: 20 mmol/L — ABNORMAL LOW (ref 22–32)
Calcium: 8.7 mg/dL — ABNORMAL LOW (ref 8.9–10.3)
Chloride: 101 mmol/L (ref 98–111)
Creatinine, Ser: 1.3 mg/dL — ABNORMAL HIGH (ref 0.61–1.24)
GFR calc Af Amer: >60 mL/min (ref >60)
GFR calc non Af Amer: >60 mL/min (ref >60)
Glucose, Bld: 318 mg/dL — ABNORMAL HIGH (ref 70–99)
Potassium: 4.7 mmol/L (ref 3.5–5.1)
Sodium: 134 mmol/L — ABNORMAL LOW (ref 135–145)
Total Bilirubin: 1.4 mg/dL — ABNORMAL HIGH (ref 0.3–1.2)
Total Protein: 7.3 g/dL (ref 6.5–8.1)

## 2019-07-22 LAB — GLUCOSE, CAPILLARY
Glucose-Capillary: 308 mg/dL — ABNORMAL HIGH (ref 70–99)
Glucose-Capillary: 259 mg/dL — ABNORMAL HIGH (ref 70–99)
Glucose-Capillary: 211 mg/dL — ABNORMAL HIGH (ref 70–99)

## 2019-07-22 LAB — C-REACTIVE PROTEIN: CRP: 4.6 mg/dL — ABNORMAL HIGH (ref <1.0)

## 2019-07-22 MED ORDER — FUROSEMIDE 10 MG/ML IJ SOLN
40.0000 mg | Freq: Once | INTRAMUSCULAR | Status: AC
  Administered 2019-07-22: 40 mg via INTRAVENOUS
  Filled 2019-07-22: qty 4

## 2019-07-22 NOTE — Progress Notes (Signed)
SATURATION QUALIFICATIONS: (This note is used to comply with regulatory documentation for home oxygen)  Patient Saturations on Room Air at Rest = 90%  Patient Saturations on Room Air while Ambulating = 81%  Patient Saturations on 3 Liters of oxygen while Ambulating = 87-90%  Please briefly explain why patient needs home oxygen: Pt needs supplemental oxygen to maintain saturations at 88% and above while ambulating.

## 2019-07-22 NOTE — Progress Notes (Signed)
Ambulation Note  Saturation Pre: 90% on RA  Ambulation Distance: 150 ft  Saturation During Ambulation: 87-90% on 3L  Notes: Pt walked independently. Tolerated well. Pt's sats dropped to 81% upon standing up. Sats increased to >90% on 2L. A few feet into the walk, sats dropped to 84% on 2L. Increased to 3 L and sats stayed at 87-90%. Pt returned to recliner with call bell within reach, sats were 89% on 1L. Encouraged pt to use IS.   Philis Kendall, MS, ACSM CEP 11:02 AM 07/22/2019

## 2019-07-22 NOTE — Progress Notes (Signed)
PROGRESS NOTE  Colton Porter [id number] DOB: 01-25-1966 DOA: 07/18/2019 PCP: Ria Bush, MD   LOS: 4 days   Brief Narrative / Interim history: 54 yo M with history of HTN, HLD, GERD, anemia, trombocytopenia, chronic dCHF, DM2, ETOH cirrhosis s/p liver transplant in 2017, CKD 3b with baseline Cr 1.4-1.7, came to ED with progressive weakness, dyspnea. He was seen in the ED a day prior with a fall and was diagnosed with 11th rib fracture but dc home. He returned a day later with possible seizure activity, dyspnea and hypoxia. He was febrile in the ER and 87% on RA, lung images with pneumonia and was admitted to the hospital.   Subjective / 24h Interval events: Wants to go home  Assessment & Plan:  Principal Problem Acute Hypoxic Respiratory Failure due to Covid-19 Viral Illness -initial Covid 19 diagnosis was on 07/15/19, and patient experienced progressive shortness of breath since, now developing Covid 19 pneumonia -started on Remdesivir 1/21 >> plan to finish 1/25 -started on steroids 1/21 >> -Clinically improving and feels like he is back to baseline, however still hypoxic -Repeat chest x-ray 1/24 with slight worsening of the infiltrates.  Received Lasix 1/24, repeat Lasix 1/25 -Ambulated in the hallway today and requiring 3 L nasal cannula.   COVID-19 Labs  Recent Labs    07/20/19 0035 07/21/19 0635 07/22/19 0315  DDIMER 0.67* 0.42  --   CRP 6.0* 2.7* 4.6*    Lab Results  Component Value Date   SARSCOV2NAA POSITIVE (A) 07/15/2019   Active Problems Essential hypertension -He was hypotensive on presentation with blood pressure 90/48, at home he is on amlodipine 10 mg daily, furosemide 40 mg daily, losartan 50 mg daily, these have been now on hold and his blood pressure is stable -Hold home meds, repeat Lasix IV today  Hyponatremia -Likely in the setting of poor p.o. intake, stable  Anemia/thrombocytopenia -Chronic, of chronic disease/liver disease, no  bleeding, monitor.  Hemoglobin and platelets are stable today  Leukopenia -Due to Covid  Chronic diastolic CHF -Echo in 0000000 showed EF 55-60% and grade 2 diastolic dysfunction -Continue IV furosemide  Type 2 diabetes mellitus -A1c 7.7 in October 2020, but now 10.1 -Patient apparently does not check his CBGs at home, strongly advised to check CBGs at least 3-4 times daily  CBG (last 3)  Recent Labs    07/21/19 2108 07/22/19 0724 07/22/19 1221  GLUCAP 175* 308* 259*   History of liver transplant, prior EtOH/fatty liver disease -Received transplant in 2017 -On tacrolimus -LFTs remain normal  Chronic kidney disease stage IIIa -Baseline creatinine ranging between 1.2-1.6, creatinine on presentation 1.7.   -Creatinine is at baseline  BPH -Holding home Cardura secondary to mild hypotension on presentation  OSA  -On CPAP  Scheduled Meds: . vitamin C  500 mg Oral Daily  . aspirin EC  81 mg Oral Daily  . dexamethasone (DECADRON) injection  6 mg Intravenous Q24H  . enoxaparin (LOVENOX) injection  60 mg Subcutaneous Q24H  . escitalopram  10 mg Oral QHS  . furosemide  40 mg Intravenous Once  . insulin aspart  0-5 Units Subcutaneous QHS  . insulin aspart  0-9 Units Subcutaneous TID WC  . insulin aspart  7 Units Subcutaneous TID WC  . insulin glargine  20 Units Subcutaneous QHS  . Ipratropium-Albuterol  1 puff Inhalation Q6H  . lidocaine  1 patch Transdermal Q24H  . loratadine  10 mg Oral Daily  . mouth rinse  15 mL Mouth Rinse BID  .  pantoprazole  80 mg Oral Daily  . tacrolimus  1 mg Oral q1800  . tacrolimus  2 mg Oral Daily  . zinc sulfate  220 mg Oral Daily   Continuous Infusions:  PRN Meds:.acetaminophen **OR** acetaminophen, guaiFENesin-dextromethorphan, oxyCODONE, polyethylene glycol  DVT prophylaxis: Lovenox Code Status: Full code Family Communication: Updated wife Huan Imbert [phone] Patient admitted from: home Anticipated d/c place: home Barriers  to d/c: Repeat diuresis today, still hypoxic, anticipate home discharge within 24 hours  Consultants:  none  Procedures:  none  Microbiology: none  Antimicrobials: none   Objective: Vitals:   07/21/19 1948 07/22/19 0412 07/22/19 0700 07/22/19 1200  BP:  128/76 134/85 117/75  Pulse: 80 (!) 55 (!) 59   Resp: (!) 21 (!) 22  (!) 24  Temp:  97.9 F (36.6 C) 97.7 F (36.5 C) 97.8 F (36.6 C)  TempSrc:  Oral Oral Oral  SpO2: 91% 92%    Weight:      Height:        Intake/Output Summary (Last 24 hours) at 07/22/2019 1334 Last data filed at 07/22/2019 1000 Gross per 24 hour  Intake 300 ml  Output 1450 ml  Net -1150 ml   Filed Weights   07/18/19 1117  Weight: 127 kg    Examination:  Constitutional: No distress Eyes: No scleral icterus seen ENMT: Moist mucous membranes Neck: normal, supple Respiratory: Bibasilar rhonchi, no wheezing, no crackles Cardiovascular: Regular rate and rhythm, no murmurs appreciated Abdomen: Soft, nontender, nondistended, bowel sounds positive Musculoskeletal: no clubbing / cyanosis.  Skin: No rashes seen Neurologic: Nonfocal  Data Reviewed: I have independently reviewed following labs and imaging studies   CBC: Recent Labs  Lab 07/17/19 1406 07/17/19 1406 07/18/19 1137 07/19/19 1237 07/20/19 0035 07/21/19 0635 07/22/19 0315  WBC 2.9*   < > 2.8* 2.4* 2.2* 2.7* 3.5*  NEUTROABS 2.0  --  2.1  --   --   --   --   HGB 12.9*   < > 11.9* 11.4* 11.7* 12.2* 12.2*  HCT 38.6*   < > 36.0* 33.9* 34.5* 37.3* 36.8*  MCV 85.2   < > 85.7 83.9 84.1 85.6 85.0  PLT 94*   < > 93* 87* 88* 106* 121*   < > = values in this interval not displayed.   Basic Metabolic Panel: Recent Labs  Lab 07/18/19 1137 07/19/19 1237 07/20/19 0035 07/21/19 0635 07/22/19 0315  NA 133* 133* 133* 135 134*  K 3.7 4.4 4.5 5.1 4.7  CL 101 103 102 104 101  CO2 20* 18* 20* 21* 20*  GLUCOSE 183* 287* 331* 318* 318*  BUN 29* 32* 33* 33* 36*  CREATININE 1.75* 1.41*  1.37* 1.26* 1.30*  CALCIUM 8.1* 8.4* 8.5* 8.7* 8.7*  MG  --   --  1.9  --   --   PHOS  --   --  3.0  --   --    GFR: Estimated Creatinine Clearance: 83.8 mL/min (A) (by C-G formula based on SCr of 1.3 mg/dL (H)). Liver Function Tests: Recent Labs  Lab 07/18/19 1137 07/19/19 1237 07/20/19 0035 07/21/19 0635 07/22/19 0315  AST 40 30 26 18 19   ALT 43 37 34 29 26  ALKPHOS 56 49 53 54 58  BILITOT 0.6 0.8 0.5 0.9 1.4*  PROT 7.1 6.9 6.9 7.1 7.3  ALBUMIN 3.2* 2.9* 3.0* 3.1* 3.2*   No results for input(s): LIPASE, AMYLASE in the last 168 hours. No results for input(s): AMMONIA in the last 168  hours. Coagulation Profile: No results for input(s): INR, PROTIME in the last 168 hours. Cardiac Enzymes: No results for input(s): CKTOTAL, CKMB, CKMBINDEX, TROPONINI in the last 168 hours. BNP (last 3 results) No results for input(s): PROBNP in the last 8760 hours. HbA1C: No results for input(s): HGBA1C in the last 72 hours. CBG: Recent Labs  Lab 07/21/19 1148 07/21/19 1552 07/21/19 2108 07/22/19 0724 07/22/19 1221  GLUCAP 305* 251* 175* 308* 259*   Lipid Profile: No results for input(s): CHOL, HDL, LDLCALC, TRIG, CHOLHDL, LDLDIRECT in the last 72 hours. Thyroid Function Tests: No results for input(s): TSH, T4TOTAL, FREET4, T3FREE, THYROIDAB in the last 72 hours. Anemia Panel: No results for input(s): VITAMINB12, FOLATE, FERRITIN, TIBC, IRON, RETICCTPCT in the last 72 hours. Urine analysis:    Component Value Date/Time   COLORURINE AMBER (A) 07/15/2019 1910   APPEARANCEUR HAZY (A) 07/15/2019 1910   LABSPEC 1.017 07/15/2019 1910   PHURINE 5.0 07/15/2019 1910   GLUCOSEU NEGATIVE 07/15/2019 1910   HGBUR NEGATIVE 07/15/2019 1910   BILIRUBINUR NEGATIVE 07/15/2019 1910   BILIRUBINUR NEG 01/19/2017 1059   KETONESUR NEGATIVE 07/15/2019 1910   PROTEINUR 30 (A) 07/15/2019 1910   UROBILINOGEN 0.2 01/19/2017 1059   UROBILINOGEN 1.0 11/21/2014 1655   NITRITE NEGATIVE 07/15/2019 1910    LEUKOCYTESUR NEGATIVE 07/15/2019 1910   Sepsis Labs: Invalid input(s): PROCALCITONIN, LACTICIDVEN  Recent Results (from the past 240 hour(s))  Respiratory Panel by RT PCR (Flu A&B, Covid) - Nasopharyngeal Swab     Status: Abnormal   Collection Time: 07/15/19  7:15 PM   Specimen: Nasopharyngeal Swab  Result Value Ref Range Status   SARS Coronavirus 2 by RT PCR POSITIVE (A) NEGATIVE Final    Comment: RESULT CALLED TO, READ BACK BY AND VERIFIED WITH: EMAIL L BERDIK 07/15/19 2110 JDW (NOTE) SARS-CoV-2 target nucleic acids are DETECTED. SARS-CoV-2 RNA is generally detectable in upper respiratory specimens  during the acute phase of infection. Positive results are indicative of the presence of the identified virus, but do not rule out bacterial infection or co-infection with other pathogens not detected by the test. Clinical correlation with patient history and other diagnostic information is necessary to determine patient infection status. The expected result is Negative. Fact Sheet for Patients:  PinkCheek.be Fact Sheet for Healthcare Providers: GravelBags.it This test is not yet approved or cleared by the [place] FDA and  has been authorized for detection and/or diagnosis of SARS-CoV-2 by FDA under an Emergency Use Authorization (EUA).  This EUA will remain in effect (meaning this test can be used) for t he duration of  the COVID-19 declaration under Section 564(b)(1) of the Act, 21 U.S.C. section 360bbb-3(b)(1), unless the authorization is terminated or revoked sooner.    Influenza A by PCR NEGATIVE NEGATIVE Final   Influenza B by PCR NEGATIVE NEGATIVE Final    Comment: (NOTE) The Xpert Xpress SARS-CoV-2/FLU/RSV assay is intended as an aid in  the diagnosis of influenza from Nasopharyngeal swab specimens and  should not be used as a sole basis for treatment. Nasal washings and  aspirates are unacceptable for Xpert  Xpress SARS-CoV-2/FLU/RSV  testing. Fact Sheet for Patients: PinkCheek.be Fact Sheet for Healthcare Providers: GravelBags.it This test is not yet approved or cleared by the [place] FDA and  has been authorized for detection and/or diagnosis of SARS-CoV-2 by  FDA under an Emergency Use Authorization (EUA). This EUA will remain  in effect (meaning this test can be used) for the duration of the  Covid-19 declaration  under Section 564(b)(1) of the Act, 21  U.S.C. section 360bbb-3(b)(1), unless the authorization is  terminated or revoked. Performed at [facility], [place] [address] [place], [place] [address]   Blood Culture (routine x 2)     Status: None (Preliminary result)   Collection Time: 07/18/19 11:37 AM   Specimen: BLOOD  Result Value Ref Range Status   Specimen Description   Final    BLOOD RIGHT ANTECUBITAL Performed at [facility] [address] [place], [place] [address]    Special Requests   Final    BOTTLES DRAWN AEROBIC AND ANAEROBIC Blood Culture adequate volume Performed at [facility] [address] [place], [place] [address]    Culture   Final    NO GROWTH 4 DAYS Performed at [facility], [place] [address] [place], [place] [address]    Report Status PENDING  Incomplete  Blood Culture (routine x 2)     Status: None (Preliminary result)   Collection Time: 07/18/19 11:42 AM   Specimen: BLOOD RIGHT HAND  Result Value Ref Range Status   Specimen Description   Final    BLOOD RIGHT HAND Performed at [facility] [address], [place], [place] [address]    Special Requests   Final    Blood Culture adequate volume Performed at [facility] [address], [place], [place] [address]    Culture   Final    NO GROWTH 4 DAYS Performed at [facility], [place] [address] [place],  [address]    Report  Status PENDING  Incomplete      Radiology Studies: DG CHEST PORT 1 VIEW  Result Date: 07/21/2019 CLINICAL DATA:  Hypoxemia EXAM: PORTABLE CHEST 1 VIEW COMPARISON:  07/18/2019 FINDINGS: Bilateral patchy interstitial and alveolar airspace disease which has increased compared with the prior exam. No pleural effusion or pneumothorax. Stable cardiomediastinal silhouette. No aggressive osseous lesion. IMPRESSION: Worsening bilateral interstitial and alveolar airspace disease concerning for progression of multilobar pneumonia/atypical viral pneumonia versus pulmonary edema. Electronically Signed   By: Kathreen Devoid   On: 07/21/2019 10:54    Marzetta Board, MD, PhD [facility]  Contact via  [email or web address]

## 2019-07-22 NOTE — Progress Notes (Signed)
Inpatient Diabetes Program Recommendations  AACE/ADA: New Consensus Statement on Inpatient Glycemic Control   Target Ranges:  Prepandial:   less than 140 mg/dL      Peak postprandial:   less than 180 mg/dL (1-2 hours)      Critically ill patients:  140 - 180 mg/dL   Results for SERJIO, STROHMAIER (MRN [id number]) as of 07/22/2019 10:13  Ref. Range 07/21/2019 07:25 07/21/2019 11:48 07/21/2019 15:52 07/21/2019 21:08 07/22/2019 07:24  Glucose-Capillary Latest Ref Range: 70 - 99 mg/dL 289 (H) 305 (H) 251 (H) 175 (H) 308 (H)   Review of Glycemic Control  Diabetes history: DM2 Outpatient Diabetes medications: Lantus 10 units daily, Amaryl 2 mg daily Current orders for Inpatient glycemic control: Lantus 20 units QHS, Novolog 7 units TID with meals, Novolog 0-9 units TID with meals, Novolog 0-5 units QHS  Inpatient Diabetes Program Recommendations:    Insulin-Basal: Please consider discontinuing Lantus and ordering Levemir 13 units BID to start this morning (peak of Levemir tends to work better when steroids are ordered and BID dosing will allow for adjustments to be made sooner).  Thanks, Barnie Alderman, RN, MSN, CDE Diabetes Coordinator Inpatient Diabetes Program [phone] (Team Pager from 8am to 5pm)

## 2019-07-23 DIAGNOSIS — R0902 Hypoxemia: Secondary | ICD-10-CM

## 2019-07-23 LAB — CULTURE, BLOOD (ROUTINE X 2)
Culture: NO GROWTH
Culture: NO GROWTH
Special Requests: ADEQUATE
Special Requests: ADEQUATE

## 2019-07-23 LAB — GLUCOSE, CAPILLARY
Glucose-Capillary: 244 mg/dL — ABNORMAL HIGH (ref 70–99)
Glucose-Capillary: 305 mg/dL — ABNORMAL HIGH (ref 70–99)
Glucose-Capillary: 232 mg/dL — ABNORMAL HIGH (ref 70–99)

## 2019-07-23 MED ORDER — FREESTYLE LIBRE SENSOR SYSTEM MISC: 1.0000 | Freq: Once | 0 refills | Status: DC

## 2019-07-23 MED ORDER — ALBUTEROL SULFATE HFA 108 (90 BASE) MCG/ACT IN AERS: 2.0000 | INHALATION_SPRAY | Freq: Four times a day (QID) | RESPIRATORY_TRACT | 0 refills | Status: DC | PRN

## 2019-07-23 MED ORDER — GUAIFENESIN-DM 100-10 MG/5ML PO SYRP: 10.0000 mL | ORAL_SOLUTION | ORAL | 0 refills | Status: DC | PRN

## 2019-07-23 MED ORDER — DEXAMETHASONE 6 MG PO TABS: 6.0000 mg | ORAL_TABLET | Freq: Every day | ORAL | 0 refills | Status: DC

## 2019-07-23 NOTE — Care Management Important Message (Signed)
Important Message  Patient Details  Name: Colton Porter MRN: [id number] Date of Birth: 01-15-1966   Medicare Important Message Given:  Yes - Important Message mailed due to current National Emergency  Verbal consent obtained due to current National Emergency  Relationship to patient: Spouse/Significant Other Contact Name: Naftuli Dhondt Call Date: 07/23/19  Time: 1359 Phone: [phone] Outcome: Spoke with contact Important Message mailed to: Patient address on file    [name] 07/23/2019, 1:59 PM

## 2019-07-23 NOTE — Discharge Summary (Signed)
Physician Discharge Summary  ARUL FARABEE HOZ:[id number] DOB: Apr 02, 1966 DOA: 07/18/2019  PCP: Ria Bush, MD  Admit date: 07/18/2019 Discharge date: 07/23/2019  Admitted From: home Disposition:  home  Recommendations for Outpatient Follow-up:  1. Follow up with PCP in 1-2 weeks 2. He is to continue Decadron for 5 additional days 3. Patient discharged on home oxygen  Home Health: None Equipment/Devices: Home O2  Discharge Condition: Stable CODE STATUS: Full code Diet recommendation: Diabetic  HPI: Per admitting MD, Colton Porter is a 54 y.o. male with medical history significant of essential hypertension, hyperlipidemia, GERD, anemia, thrombocytopenia, chronic diastolic congestive heart failure, type 2 diabetes mellitus, alcoholic cirrhosis status post liver transplant, CKD stage III, gout, BPH 3 presented to the ED with progressive shortness of breath, weakness, fatigue.  Was seen in the ED yesterday after a presyncopal episode and discharged home with diagnosis of rib fracture.  Now representing with progressive shortness of breath and now hypoxic.  Initial concern of possible seizure activity, although no loss of bowel/bladder function or tongue biting.  Also patient endorses poor oral intake.  No other specific complaints at this time. ED Course: Temperature 100.5, HR 105, BP 90/48, SPO2 87% on room air.  WBC count 2.8, hemoglobin 11.9, platelet 93, sodium 133, potassium 3.7, chloride 101, CO2 20, BUN 29, creatinine 1.75, glucose 183.  Procalcitonin less than 0.10, CRP 8.2, ferritin 290, LDH 182, D-dimer 0.90, fibrinogen 536.  Troponin XI.  CT head with no mass/hemorrhage, opacified right medial mastoid air cells.  Chest x-ray with hazy perihilar opacification.  Given his hypoxia, mild hypotension and elevated temperature, ED physician referred for admission.  Hospital Course / Discharge diagnoses: Principal problem Acute Hypoxic Respiratory Failure due to Covid-19 Viral  Illness -initial Covid 19 diagnosis was on 07/15/19, and patient experienced progressive shortness of breath since, eventually developed Covid 19 pneumonia and was admitted to the hospital.  He finished a 5 day course of Remdesivir while hospitalized.  He was also started on steroids.  Clinically he has improved, his respiratory status has returned to baseline, he is able to ambulate in the hallway without any significant difficulties.  His oxygen levels on pulse ox are low despite being completely asymptomatic and he is requiring oxygen on discharge which she has been prescribed.  Advised to follow-up with PCP within couple of weeks  Active Problems Essential hypertension -He was hypotensive on presentation with blood pressure 90/48, now resolved.  He has a good appetite.  Resume home medications on discharge Hyponatremia -Likely in the setting of poor p.o. intake, stable, improved Anemia/thrombocytopenia -Chronic, of chronic disease/liver disease, no bleeding, monitor.  Hemoglobin and platelets are stable Leukopenia -Due to Covid, improving Chronic diastolic CHF -Echo in 7048 showed EF 55-60% and grade 2 diastolic dysfunction.  Continue home medications Type 2 diabetes mellitus -A1c 7.7 in October 2020, but now 10.1, patient told me that he does not check his CBGs at home, strongly advised to check CBGs at least 3-4 times daily.  Will prescribe 14-day libre on discharge History of liver transplant, prior EtOH/fatty liver disease -Received transplant in 2017, continue tacrolimus, LFTs were stable during hospital stay Chronic kidney disease stage IIIa -Baseline creatinine ranging between 1.2-1.6, currently stable at baseline.  Continue to monitor as an outpatient.  BPH -resume home medications OSA -On CPAP   Discharge Instructions   Allergies as of 07/23/2019      Reactions   Tolmetin Rash   cirrhosis   Ambien [zolpidem Tartrate] Other (  See Comments)   Over-toxicity from liver failure    Morphine And Related    Hallucinations.   Hydrochlorothiazide W-triamterene Other (See Comments)   REACTION: dizzy, nausea   Lisinopril Other (See Comments)   REACTION: cough, decreased libido      Medication List    STOP taking these medications   oxyCODONE 5 MG immediate release tablet Commonly known as: Roxicodone   oxyCODONE-acetaminophen 5-325 MG tablet Commonly known as: Percocet   rizatriptan 10 MG tablet Commonly known as: MAXALT     TAKE these medications   Accu-Chek Guide w/Device Kit 1 Units by Does not apply route as directed.   acetaminophen 500 MG tablet Commonly known as: TYLENOL Take 500 mg by mouth every 6 (six) hours as needed for mild pain.   albuterol 108 (90 Base) MCG/ACT inhaler Commonly known as: VENTOLIN HFA Inhale 2 puffs into the lungs every 6 (six) hours as needed for wheezing or shortness of breath.   allopurinol 300 MG tablet Commonly known as: ZYLOPRIM Take 900 mg by mouth daily.   amLODipine 10 MG tablet Commonly known as: NORVASC TAKE 1 TABLET EVERY DAY   Aspir-Low 81 MG EC tablet Generic drug: aspirin Take 1 tablet (81 mg total) by mouth daily.   [employer] SPECTRAVITE PO Take 1 tablet by mouth at bedtime.   dexamethasone 6 MG tablet Commonly known as: DECADRON Take 1 tablet (6 mg total) by mouth daily.   docusate sodium 100 MG capsule Commonly known as: COLACE Take 100 mg by mouth at bedtime.   doxazosin 8 MG tablet Commonly known as: CARDURA TAKE 1 TABLET BY MOUTH AT BEDTIME.   escitalopram 10 MG tablet Commonly known as: LEXAPRO TAKE 1 TABLET EVERY DAY What changed: when to take this   FISH OIL PO Take 1 tablet by mouth at bedtime.   fluticasone 50 MCG/ACT nasal spray Commonly known as: FLONASE Place 2 sprays into both nostrils daily.   FreeStyle [employer] Misc 1 kit by Does not apply route once for 1 dose. Please dispense freestyle libre system with 14-day sensors.  Generic okay   furosemide 40 MG  tablet Commonly known as: LASIX TAKE 1 TABLET EVERY DAY   glimepiride 2 MG tablet Commonly known as: AMARYL Take 1 tablet (2 mg total) by mouth daily with breakfast.   glucose blood test strip Commonly known as: Accu-Chek Guide Check sugars three times daily and as needed when feeling ill E11.65, insulin use   guaiFENesin-dextromethorphan 100-10 MG/5ML syrup Commonly known as: ROBITUSSIN DM Take 10 mLs by mouth every 4 (four) hours as needed for cough.   Insulin Pen Needle 31G X 5 MM Misc USE TO INJECT INSULIN DAILY   Lancets Ultra Thin 30G Misc Check sugars three times daily and as needed when feeling ill E11.65, insulin use   Lantus SoloStar 100 UNIT/ML Solostar Pen Generic drug: Insulin Glargine INJECT 15 UNITS INTO THE SKIN DAILY AT 10 PM What changed: See the new instructions.   loratadine 10 MG tablet Commonly known as: CLARITIN Take 10 mg by mouth daily.   losartan 50 MG tablet Commonly known as: COZAAR Take 1 tablet (50 mg total) by mouth daily.   lovastatin 20 MG tablet Commonly known as: MEVACOR Take 1 tablet (20 mg total) by mouth once a week.   omeprazole 40 MG capsule Commonly known as: PRILOSEC TAKE 1 CAPSULE EVERY DAY   tacrolimus 1 MG capsule Commonly known as: PROGRAF Take 1-2 mg by mouth See admin instructions.  Take 2 mg by mouth in the morning, then take 1 mg  by mouth in the evening   Vitamin D3 50 MCG (2000 UT) Tabs Take 1 tablet by mouth at bedtime.            Durable Medical Equipment  (From admission, onward)         Start     Ordered   07/23/19 0901  For home use only DME oxygen  Once    Comments: Covid pneumonia  Question Answer Comment  Length of Need 6 Months   Mode or (Route) Nasal cannula   Liters per Minute 3   Frequency Continuous (stationary and portable oxygen unit needed)   Oxygen conserving device Yes   Oxygen delivery system Gas      07/23/19 0900         Follow-up Information    Ria Bush, MD  Follow up on 07/26/2019.   Specialty: Family Medicine Why: at 9:00am for hospital followup. This will be a virtual visit. Contact information: [place] [address] [phone]           Consultations:  None  Procedures/Studies:   CT ABDOMEN PELVIS WO CONTRAST  Result Date: 07/17/2019 CLINICAL DATA:  Recent fall with left flank pain, initial encounter EXAM: CT ABDOMEN AND PELVIS WITHOUT CONTRAST TECHNIQUE: Multidetector CT imaging of the abdomen and pelvis was performed following the standard protocol without IV contrast. COMPARISON:  None. FINDINGS: Lower chest: Lung bases demonstrate patchy ground-glass opacities particularly in the right lower lobe consistent with the given clinical history of COVID-19 positivity. Hepatobiliary: Fatty infiltration of the liver is noted. The gallbladder has been surgically removed. Pancreas: Unremarkable. No pancreatic ductal dilatation or surrounding inflammatory changes. Spleen: Normal in size without focal abnormality. Adrenals/Urinary Tract: Adrenal glands are within normal limits. Kidneys are well visualized bilaterally. No renal calculi or urinary tract obstructive changes are seen. The bladder is decompressed. Stomach/Bowel: Colon is well visualized and decompressed. The appendix is within normal limits. No small bowel abnormality is noted. The stomach is unremarkable. Vascular/Lymphatic: Aortic atherosclerosis. No enlarged abdominal or pelvic lymph nodes. Reproductive: Prostate is unremarkable. Other: No free fluid is noted. Some fat containing anterior abdominal wall hernias are seen to the right of the midline in area of previous surgery. Musculoskeletal: Mildly displaced fracture of the left eleventh rib is noted centrally. No other definitive rib fractures are seen. No compression deformities are noted. Degenerative changes of the lower thoracic spine are seen. IMPRESSION: Eleventh rib fracture posteriorly near the  costovertebral junction without complicating factors. Patchy ground-glass opacities particularly in the right lower lobe consistent with the given clinical history of COVID-19 positivity. No other focal abnormality is seen. Electronically Signed   By: Inez Catalina M.D.   On: 07/17/2019 13:52   DG Chest 2 View  Result Date: 07/15/2019 CLINICAL DATA:  Generalized body aches. EXAM: CHEST - 2 VIEW COMPARISON:  June 10, 2016. FINDINGS: The heart size and mediastinal contours are within normal limits. Both lungs are clear. The visualized skeletal structures are unremarkable. IMPRESSION: No active cardiopulmonary disease. Electronically Signed   By: Marijo Conception M.D.   On: 07/15/2019 12:57   CT Head Wo Contrast  Result Date: 07/18/2019 CLINICAL DATA:  Seizure.  COVID-19 positive EXAM: CT HEAD WITHOUT CONTRAST TECHNIQUE: Contiguous axial images were obtained from the base of the skull through the vertex without intravenous contrast. COMPARISON:  None. FINDINGS: Brain: Ventricles and sulci are normal in size and  configuration. There is no intracranial mass, hemorrhage, extra-axial fluid collection, or midline shift. Brain parenchyma appears unremarkable. No evident acute infarct. Vascular: No hyperdense vessel. No appreciable vascular calcification. Skull: The bony calvarium appears intact. Sinuses/Orbits: There is mucosal thickening in several ethmoid air cells. There is mild rightward deviation of the nasal septum. There is a concha bullosa on the left, an anatomic variant. Orbits appear symmetric bilaterally. Other: Mastoid air cells on the left are clear. There is opacification of multiple mastoid air cells on the right. IMPRESSION: Brain parenchyma appears unremarkable.  No mass or hemorrhage. Opacification of multiple right-sided mastoid air cells. There is mucosal thickening in several ethmoid air cells. Deviated nasal septum noted. Electronically Signed   By: Lowella Grip III M.D.   On: 07/18/2019  13:08   CT L-SPINE NO CHARGE  Result Date: 07/17/2019 CLINICAL DATA:  Left flank pain.  History of liver transplant. EXAM: CT LUMBAR SPINE WITHOUT CONTRAST TECHNIQUE: Multidetector CT imaging of the lumbar spine was performed without intravenous contrast administration. Multiplanar CT image reconstructions were also generated. COMPARISON:  None. FINDINGS: Segmentation: Normal Alignment: Normal Vertebrae: Negative for lumbar fracture or mass. Paraspinal and other soft tissues: Negative for paraspinous mass or adenopathy. Disc levels: L1-2: Mild degenerative change.  Negative for stenosis L2-3: Mild disc and mild facet degeneration.  Negative for stenosis L3-4: Mild disc and mild facet degeneration.  Negative for stenosis L4-5: Mild disc bulging and mild facet degeneration. Negative for stenosis L5-S1: Negative IMPRESSION: Mild lumbar degenerative change. No acute abnormality. Negative for neural impingement or stenosis. Electronically Signed   By: Franchot Gallo M.D.   On: 07/17/2019 13:51   DG CHEST PORT 1 VIEW  Result Date: 07/21/2019 CLINICAL DATA:  Hypoxemia EXAM: PORTABLE CHEST 1 VIEW COMPARISON:  07/18/2019 FINDINGS: Bilateral patchy interstitial and alveolar airspace disease which has increased compared with the prior exam. No pleural effusion or pneumothorax. Stable cardiomediastinal silhouette. No aggressive osseous lesion. IMPRESSION: Worsening bilateral interstitial and alveolar airspace disease concerning for progression of multilobar pneumonia/atypical viral pneumonia versus pulmonary edema. Electronically Signed   By: Kathreen Devoid   On: 07/21/2019 10:54   DG Chest Port 1 View  Result Date: 07/18/2019 CLINICAL DATA:  Seizure witnessed by family. COVID positive. Rib fracture yesterday. EXAM: PORTABLE CHEST 1 VIEW COMPARISON:  07/17/2019 FINDINGS: Lungs are adequately inflated and demonstrate subtle stable hazy perihilar opacification which may be due to edema or infection. No effusion. Mild  stable cardiomegaly. Remainder the exam is unchanged. IMPRESSION: Subtle stable hazy perihilar opacification which may be due to mild vascular congestion or infection. Mild stable cardiomegaly. Electronically Signed   By: Marin Olp M.D.   On: 07/18/2019 12:57   DG Chest Portable 1 View  Result Date: 07/17/2019 CLINICAL DATA:  Fever and shortness of breath.  COVID positive. EXAM: PORTABLE CHEST 1 VIEW COMPARISON:  Chest x-ray dated July 15, 2019. FINDINGS: The heart size and mediastinal contours are within normal limits. Normal pulmonary vascularity. Slightly increased markings at the right lung base. No pleural effusion or pneumothorax. No acute osseous abnormality. IMPRESSION: Slightly increased markings at the right lung base may reflect pneumonia. Electronically Signed   By: Titus Dubin M.D.   On: 07/17/2019 12:22     Subjective: - no chest pain, shortness of breath, no abdominal pain, nausea or vomiting.   Discharge Exam: BP 125/77 (BP Location: Left Arm)   Pulse 62   Temp 97.7 F (36.5 C) (Oral)   Resp 20   Ht 5'  6" (1.676 m)   Wt 127 kg   SpO2 91%   BMI 45.19 kg/m   General: Pt is alert, awake, not in acute distress Cardiovascular: RRR, S1/S2 +, no rubs, no gallops Respiratory: CTA bilaterally, no wheezing, no rhonchi Abdominal: Soft, NT, ND, bowel sounds + Extremities: no edema, no cyanosis    The results of significant diagnostics from this hospitalization (including imaging, microbiology, ancillary and laboratory) are listed below for reference.     Microbiology: Recent Results (from the past 240 hour(s))  Respiratory Panel by RT PCR (Flu A&B, Covid) - Nasopharyngeal Swab     Status: Abnormal   Collection Time: 07/15/19  7:15 PM   Specimen: Nasopharyngeal Swab  Result Value Ref Range Status   SARS Coronavirus 2 by RT PCR POSITIVE (A) NEGATIVE Final    Comment: RESULT CALLED TO, READ BACK BY AND VERIFIED WITH: EMAIL L BERDIK 07/15/19 2110  JDW (NOTE) SARS-CoV-2 target nucleic acids are DETECTED. SARS-CoV-2 RNA is generally detectable in upper respiratory specimens  during the acute phase of infection. Positive results are indicative of the presence of the identified virus, but do not rule out bacterial infection or co-infection with other pathogens not detected by the test. Clinical correlation with patient history and other diagnostic information is necessary to determine patient infection status. The expected result is Negative. Fact Sheet for Patients:  PinkCheek.be Fact Sheet for Healthcare Providers: GravelBags.it This test is not yet approved or cleared by the [place] FDA and  has been authorized for detection and/or diagnosis of SARS-CoV-2 by FDA under an Emergency Use Authorization (EUA).  This EUA will remain in effect (meaning this test can be used) for t he duration of  the COVID-19 declaration under Section 564(b)(1) of the Act, 21 U.S.C. section 360bbb-3(b)(1), unless the authorization is terminated or revoked sooner.    Influenza A by PCR NEGATIVE NEGATIVE Final   Influenza B by PCR NEGATIVE NEGATIVE Final    Comment: (NOTE) The Xpert Xpress SARS-CoV-2/FLU/RSV assay is intended as an aid in  the diagnosis of influenza from Nasopharyngeal swab specimens and  should not be used as a sole basis for treatment. Nasal washings and  aspirates are unacceptable for Xpert Xpress SARS-CoV-2/FLU/RSV  testing. Fact Sheet for Patients: PinkCheek.be Fact Sheet for Healthcare Providers: GravelBags.it This test is not yet approved or cleared by the [place] FDA and  has been authorized for detection and/or diagnosis of SARS-CoV-2 by  FDA under an Emergency Use Authorization (EUA). This EUA will remain  in effect (meaning this test can be used) for the duration of the  Covid-19 declaration under  Section 564(b)(1) of the Act, 21  U.S.C. section 360bbb-3(b)(1), unless the authorization is  terminated or revoked. Performed at [facility], [place] [address] [place], [place] [address]   Blood Culture (routine x 2)     Status: None (Preliminary result)   Collection Time: 07/18/19 11:37 AM   Specimen: BLOOD  Result Value Ref Range Status   Specimen Description   Final    BLOOD RIGHT ANTECUBITAL Performed at [facility] [address] [place], [place] [address]    Special Requests   Final    BOTTLES DRAWN AEROBIC AND ANAEROBIC Blood Culture adequate volume Performed at [facility] [address] [place],  [address]    Culture   Final    NO GROWTH 4 DAYS Performed at [facility], [place] [address], [place],  [address]  Report Status PENDING  Incomplete  Blood Culture (routine x 2)     Status: None (Preliminary result)   Collection Time: 07/18/19 11:42 AM   Specimen: BLOOD RIGHT HAND  Result Value Ref Range Status   Specimen Description   Final    BLOOD RIGHT HAND Performed at [facility] [address] [place], [place] [address]    Special Requests   Final    Blood Culture adequate volume Performed at [facility] [address], [place], [place] [address]    Culture   Final    NO GROWTH 4 DAYS Performed at [facility], [place] [address], [place], [place] [address]    Report Status PENDING  Incomplete     Labs: Basic Metabolic Panel: Recent Labs  Lab 07/18/19 1137 07/19/19 1237 07/20/19 0035 07/21/19 0635 07/22/19 0315  NA 133* 133* 133* 135 134*  K 3.7 4.4 4.5 5.1 4.7  CL 101 103 102 104 101  CO2 20* 18* 20* 21* 20*  GLUCOSE 183* 287* 331* 318* 318*  BUN 29* 32* 33* 33* 36*  CREATININE 1.75* 1.41* 1.37* 1.26* 1.30*  CALCIUM 8.1* 8.4* 8.5* 8.7* 8.7*  MG  --   --  1.9  --   --   PHOS  --   --  3.0  --   --    Liver Function Tests: Recent Labs   Lab 07/18/19 1137 07/19/19 1237 07/20/19 0035 07/21/19 0635 07/22/19 0315  AST 40 _0 ALT 43 37 34 29 26  ALKPHOS 56 49 53 54 58  BILITOT 0.6 0.8 0.5 0.9 1.4*  PROT 7.1 6.9 6.9 7.1 7.3  ALBUMIN 3.2* 2.9* 3.0* 3.1* 3.2*   CBC: Recent Labs  Lab 07/17/19 1406 07/17/19 1406 07/18/19 1137 07/19/19 1237 07/20/19 0035 07/21/19 0635 07/22/19 0315  WBC 2.9*   < > 2.8* 2.4* 2.2* 2.7* 3.5*  NEUTROABS 2.0  --  2.1  --   --   --   --   HGB 12.9*   < > 11.9* 11.4* 11.7* 12.2* 12.2*  HCT 38.6*   < > 36.0* 33.9* 34.5* 37.3* 36.8*  MCV 85.2   < > 85.7 83.9 84.1 85.6 85.0  PLT 94*   < > 93* 87* 88* 106* 121*   < > = values in this interval not displayed.   CBG: Recent Labs  Lab 07/21/19 2108 07/22/19 0724 07/22/19 1221 07/22/19 1701 07/23/19 0738  GLUCAP 175* 308* 259* 211* 305*   Hgb A1c No results for input(s): HGBA1C in the last 72 hours. Lipid Profile No results for input(s): CHOL, HDL, LDLCALC, TRIG, CHOLHDL, LDLDIRECT in the last 72 hours. Thyroid function studies No results for input(s): TSH, T4TOTAL, T3FREE, THYROIDAB in the last 72 hours.  Invalid input(s): FREET3 Urinalysis    Component Value Date/Time   COLORURINE AMBER (A) 07/15/2019 1910   APPEARANCEUR HAZY (A) 07/15/2019 1910   LABSPEC 1.017 07/15/2019 1910   PHURINE 5.0 07/15/2019 1910   GLUCOSEU NEGATIVE 07/15/2019 1910   HGBUR NEGATIVE 07/15/2019 1910   BILIRUBINUR NEGATIVE 07/15/2019 1910   BILIRUBINUR NEG 01/19/2017 1059   KETONESUR NEGATIVE 07/15/2019 1910   PROTEINUR 30 (A) 07/15/2019 1910   UROBILINOGEN 0.2 01/19/2017 1059   UROBILINOGEN 1.0 11/21/2014 1655   NITRITE NEGATIVE 07/15/2019 1910   LEUKOCYTESUR NEGATIVE 07/15/2019 1910    FURTHER DISCHARGE INSTRUCTIONS:   Get Medicines reviewed and adjusted: Please take all your medications with you for your next  visit with your Primary MD   Laboratory/radiological data: Please request your Primary MD to go over all hospital tests  and procedure/radiological results at the follow up, please ask your Primary MD to get all Hospital records sent to his/her office.   In some cases, they will be blood work, cultures and biopsy results pending at the time of your discharge. Please request that your primary care M.D. goes through all the records of your hospital data and follows up on these results.   Also Note the following: If you experience worsening of your admission symptoms, develop shortness of breath, life threatening emergency, suicidal or homicidal thoughts you must seek medical attention immediately by calling 911 or calling your MD immediately  if symptoms less severe.   You must read complete instructions/literature along with all the possible adverse reactions/side effects for all the Medicines you take and that have been prescribed to you. Take any new Medicines after you have completely understood and accpet all the possible adverse reactions/side effects.    Do not drive when taking Pain medications or sleeping medications (Benzodaizepines)   Do not take more than prescribed Pain, Sleep and Anxiety Medications. It is not advisable to combine anxiety,sleep and pain medications without talking with your primary care practitioner   Special Instructions: If you have smoked or chewed Tobacco  in the last 2 yrs please stop smoking, stop any regular Alcohol  and or any Recreational drug use.   Wear Seat belts while driving.   Please note: You were cared for by a hospitalist during your hospital stay. Once you are discharged, your primary care physician will handle any further medical issues. Please note that NO REFILLS for any discharge medications will be authorized once you are discharged, as it is imperative that you return to your primary care physician (or establish a relationship with a primary care physician if you do not have one) for your post hospital discharge needs so that they can reassess your need for  medications and monitor your lab values.  Time coordinating discharge: 35 minutes  SIGNED:  Marzetta Board, MD, PhD 07/23/2019, 10:27 AM

## 2019-07-23 NOTE — Progress Notes (Signed)
Discharge instructions completed with patient and wife. Showed how to use oxygen tank and pulse oximeter. Patient assisted to car by RN from wheelchair.  On room air at this time and knows how to turn on O2 if needed in the car. Discharged to care of wife.

## 2019-07-23 NOTE — TOC Progression Note (Addendum)
Transition of Care [facility]) - Progression Note    Patient Details  Name: Colton Porter MRN: [id number] Date of Birth: [birth year]  Transition of Care [facility]) CM/SW Contact  Joaquin Courts, RN Phone Number: 07/23/2019, 9:32 AM  Clinical Narrative:    Patient set up with home oxygen through [place]. [employer] to deliver concentrator to home.  CM spoke with spouse who states she has an am appointment but will be home by lunch time to accept delivery. Spouse reports she will be picking up patient from hospital.     Expected Discharge Plan: Home/Self Care Barriers to Discharge: No Barriers Identified  Expected Discharge Plan and Services Expected Discharge Plan: Home/Self Care   Discharge Planning Services: CM Consult Post Acute Care Choice: Durable Medical Equipment Living arrangements for the past 2 months: Single Family Home                 DME Arranged: Oxygen DME Agency: [employer] Date DME Agency Contacted: 07/23/19 Time DME Agency Contacted: [phone] Representative spoke with at DME Agency: [place]: NA [place] Agency: NA         Social Determinants of Health ([place]) Interventions    Readmission Risk Interventions Readmission Risk Prevention Plan 07/23/2019  Transportation Screening Complete  PCP or Specialist Appt within 3-5 Days Complete  HRI or [place] Complete  Social Work Consult for [place] Planning/Counseling Complete  Palliative Care Screening Not Applicable  Medication Review Press photographer) Complete  Some recent data might be hidden

## 2019-07-23 NOTE — Telephone Encounter (Signed)
Spoke with pt to r/s 07/25/19 as a virtual visit.  Pt states he's still at the hospital right now.  He or wife, Suanne Marker (on dpr), will call back to r/s appt.  FYI to Dr. Darnell Level.

## 2019-07-23 NOTE — Telephone Encounter (Signed)
Noted.  OV c/x.

## 2019-07-23 NOTE — Discharge Instructions (Addendum)
Follow with Ria Bush, MD in 2-3 weeks   Please get a complete blood count and chemistry panel checked by your Primary MD at your next visit, and again as instructed by your Primary MD. Please get your medications reviewed and adjusted by your Primary MD.  Please request your Primary MD to go over all Hospital Tests and Procedure/Radiological results at the follow up, please get all Hospital records sent to your Prim MD by signing hospital release before you go home.  In some cases, there will be blood work, cultures and biopsy results pending at the time of your discharge. Please request that your primary care M.D. goes through all the records of your hospital data and follows up on these results.  If you had Pneumonia of Lung problems at the Hospital: Please get a 2 view Chest X ray done in 6-8 weeks after hospital discharge or sooner if instructed by your Primary MD.  If you have Congestive Heart Failure: Please call your Cardiologist or Primary MD anytime you have any of the following symptoms:  1) 3 pound weight gain in 24 hours or 5 pounds in 1 week  2) shortness of breath, with or without a dry hacking cough  3) swelling in the hands, feet or stomach  4) if you have to sleep on extra pillows at night in order to breathe  Follow cardiac low salt diet and 1.5 lit/day fluid restriction.  If you have diabetes Accuchecks 4 times/day, Once in AM empty stomach and then before each meal. Log in all results and show them to your primary doctor at your next visit. If any glucose reading is under 80 or above 300 call your primary MD immediately.  If you have Seizure/Convulsions/Epilepsy: Please do not drive, operate heavy machinery, participate in activities at heights or participate in high speed sports until you have seen by Primary MD or a Neurologist and advised to do so again. Per [facility] statutes, patients with seizures are not allowed to drive until they have been  seizure-free for six months.  Use caution when using heavy equipment or power tools. Avoid working on ladders or at heights. Take showers instead of baths. Ensure the water temperature is not too high on the home water heater. Do not go swimming alone. Do not lock yourself in a room alone (i.e. bathroom). When caring for infants or small children, sit down when holding, feeding, or changing them to minimize risk of injury to the child in the event you have a seizure. Maintain good sleep hygiene. Avoid alcohol.   If you had Gastrointestinal Bleeding: Please ask your Primary MD to check a complete blood count within one week of discharge or at your next visit. Your endoscopic/colonoscopic biopsies that are pending at the time of discharge, will also need to followed by your Primary MD.  Get Medicines reviewed and adjusted. Please take all your medications with you for your next visit with your Primary MD  Please request your Primary MD to go over all hospital tests and procedure/radiological results at the follow up, please ask your Primary MD to get all Hospital records sent to his/her office.  If you experience worsening of your admission symptoms, develop shortness of breath, life threatening emergency, suicidal or homicidal thoughts you must seek medical attention immediately by calling 911 or calling your MD immediately  if symptoms less severe.  You must read complete instructions/literature along with all the possible adverse reactions/side effects for all the Medicines you  take and that have been prescribed to you. Take any new Medicines after you have completely understood and accpet all the possible adverse reactions/side effects.   Do not drive or operate heavy machinery when taking Pain medications.   Do not take more than prescribed Pain, Sleep and Anxiety Medications  Special Instructions: If you have smoked or chewed Tobacco  in the last 2 yrs please stop smoking, stop any regular  Alcohol  and or any Recreational drug use.  Wear Seat belts while driving.  Please note You were cared for by a hospitalist during your hospital stay. If you have any questions about your discharge medications or the care you received while you were in the hospital after you are discharged, you can call the unit and asked to speak with the hospitalist on call if the hospitalist that took care of you is not available. Once you are discharged, your primary care physician will handle any further medical issues. Please note that NO REFILLS for any discharge medications will be authorized once you are discharged, as it is imperative that you return to your primary care physician (or establish a relationship with a primary care physician if you do not have one) for your aftercare needs so that they can reassess your need for medications and monitor your lab values.  You can reach the hospitalist office at phone [phone] or fax [phone]   If you do not have a primary care physician, you can call [phone] for a physician referral.  Activity: As tolerated with Full fall precautions use walker/cane & assistance as needed    Diet: diabetic  Disposition Home     COVID-19 Frequently Asked Questions COVID-19 (coronavirus disease) is an infection that is caused by a large family of viruses. Some viruses cause illness in people and others cause illness in animals like camels, cats, and bats. In some cases, the viruses that cause illness in animals can spread to humans. Where did the coronavirus come from? In December 2019, [place] told the [facility]) of several cases of lung disease (human respiratory illness). These cases were linked to an open seafood and livestock market in the city of [place]. The link to the seafood and livestock market suggests that the virus may have spread from animals to humans. However, since that first outbreak in December, the virus has also been shown to  spread from person to person. What is the name of the disease and the virus? Disease name Early on, this disease was called novel coronavirus. This is because scientists determined that the disease was caused by a new (novel) respiratory virus. The World Health Organization [facility]) has now named the disease COVID-19, or coronavirus disease. Virus name The virus that causes the disease is called severe acute respiratory syndrome coronavirus 2 (SARS-CoV-2). More information on disease and virus naming World Health Organization [facility]): www.who.int/emergencies/diseases/novel-coronavirus-2019/technical-guidance/naming-the-coronavirus-disease-(covid-2019)-and-the-virus-that-causes-it Who is at risk for complications from coronavirus disease? Some people may be at higher risk for complications from coronavirus disease. This includes older adults and people who have chronic diseases, such as heart disease, diabetes, and lung disease. If you are at higher risk for complications, take these extra precautions:  Stay home as much as possible.  Avoid social gatherings and travel.  Avoid close contact with others. Stay at least 6 ft (2 m) away from others, if possible.  Wash your hands often with soap and water for at least 20 seconds.  Avoid touching your face, mouth, nose, or eyes.  Keep  supplies on hand at home, such as food, medicine, and cleaning supplies.  If you must go out in public, wear a cloth face covering or face mask. Make sure your mask covers your nose and mouth. How does coronavirus disease spread? The virus that causes coronavirus disease spreads easily from person to person (is contagious). You may catch the virus by:  Breathing in droplets from an infected person. Droplets can be spread by a person breathing, speaking, singing, coughing, or sneezing.  Touching something, like a table or a doorknob, that was exposed to the virus (contaminated) and then touching your mouth, nose, or  eyes. Can I get the virus from touching surfaces or objects? There is still a lot that we do not know about the virus that causes coronavirus disease. Scientists are basing a lot of information on what they know about similar viruses, such as:  Viruses cannot generally survive on surfaces for long. They need a human body (host) to survive.  It is more likely that the virus is spread by close contact with people who are sick (direct contact), such as through: ? Shaking hands or hugging. ? Breathing in respiratory droplets that travel through the air. Droplets can be spread by a person breathing, speaking, singing, coughing, or sneezing.  It is less likely that the virus is spread when a person touches a surface or object that has the virus on it (indirect contact). The virus may be able to enter the body if the person touches a surface or object and then touches his or her face, eyes, nose, or mouth. Can a person spread the virus without having symptoms of the disease? It may be possible for the virus to spread before a person has symptoms of the disease, but this is most likely not the main way the virus is spreading. It is more likely for the virus to spread by being in close contact with people who are sick and breathing in the respiratory droplets spread by a person breathing, speaking, singing, coughing, or sneezing. What are the symptoms of coronavirus disease? Symptoms vary from person to person and can range from mild to severe. Symptoms may include:  Fever or chills.  Cough.  Difficulty breathing or feeling short of breath.  Headaches, body aches, or muscle aches.  Runny or stuffy (congested) nose.  Sore throat.  New loss of taste or smell.  Nausea, vomiting, or diarrhea. These symptoms can appear anywhere from 2 to 14 days after you have been exposed to the virus. Some people may not have any symptoms. If you develop symptoms, call your health care provider. People with severe  symptoms may need hospital care. Should I be tested for this virus? Your health care provider will decide whether to test you based on your symptoms, history of exposure, and your risk factors. How does a health care provider test for this virus? Health care providers will collect samples to send for testing. Samples may include:  Taking a swab of fluid from the back of your nose and throat, your nose, or your throat.  Taking fluid from the lungs by having you cough up mucus (sputum) into a sterile cup.  Taking a blood sample. Is there a treatment or vaccine for this virus? Currently, there is no vaccine to prevent coronavirus disease. Also, there are no medicines like antibiotics or antivirals to treat the virus. A person who becomes sick is given supportive care, which means rest and fluids. A person may also relieve  his or her symptoms by using over-the-counter medicines that treat sneezing, coughing, and runny nose. These are the same medicines that a person takes for the common cold. If you develop symptoms, call your health care provider. People with severe symptoms may need hospital care. What can I do to protect myself and my family from this virus?     You can protect yourself and your family by taking the same actions that you would take to prevent the spread of other viruses. Take the following actions:  Wash your hands often with soap and water for at least 20 seconds. If soap and water are not available, use alcohol-based hand sanitizer.  Avoid touching your face, mouth, nose, or eyes.  Cough or sneeze into a tissue, sleeve, or elbow. Do not cough or sneeze into your hand or the air. ? If you cough or sneeze into a tissue, throw it away immediately and wash your hands.  Disinfect objects and surfaces that you frequently touch every day.  Stay away from people who are sick.  Avoid going out in public, follow guidance from your state and local health authorities.  Avoid  crowded indoor spaces. Stay at least 6 ft (2 m) away from others.  If you must go out in public, wear a cloth face covering or face mask. Make sure your mask covers your nose and mouth.  Stay home if you are sick, except to get medical care. Call your health care provider before you get medical care. Your health care provider will tell you how long to stay home.  Make sure your vaccines are up to date. Ask your health care provider what vaccines you need. What should I do if I need to travel? Follow travel recommendations from your local health authority, the CDC, and WHO. Travel information and advice  Centers for Disease Control and Prevention (CDC): BodyEditor.hu  World Health Organization [facility]): ThirdIncome.ca Know the risks and take action to protect your health  You are at higher risk of getting coronavirus disease if you are traveling to areas with an outbreak or if you are exposed to travelers from areas with an outbreak.  Wash your hands often and practice good hygiene to lower the risk of catching or spreading the virus. What should I do if I am sick? General instructions to stop the spread of infection  Wash your hands often with soap and water for at least 20 seconds. If soap and water are not available, use alcohol-based hand sanitizer.  Cough or sneeze into a tissue, sleeve, or elbow. Do not cough or sneeze into your hand or the air.  If you cough or sneeze into a tissue, throw it away immediately and wash your hands.  Stay home unless you must get medical care. Call your health care provider or local health authority before you get medical care.  Avoid public areas. Do not take public transportation, if possible.  If you can, wear a mask if you must go out of the house or if you are in close contact with someone who is not sick. Make sure your mask covers your nose and  mouth. Keep your home clean  Disinfect objects and surfaces that are frequently touched every day. This may include: ? Counters and tables. ? Doorknobs and light switches. ? Sinks and faucets. ? Electronics such as phones, remote controls, keyboards, computers, and tablets.  Wash dishes in hot, soapy water or use a dishwasher. Air-dry your dishes.  [place] laundry in hot  water. Prevent infecting other household members  Let healthy household members care for children and pets, if possible. If you have to care for children or pets, wash your hands often and wear a mask.  Sleep in a different bedroom or bed, if possible.  Do not share personal items, such as razors, toothbrushes, deodorant, combs, brushes, towels, and washcloths. Where to find more information Centers for Disease Control and Prevention (CDC)  Information and news updates: https://www.butler-gonzalez.com/ World Health Organization [facility])  Information and news updates: MissExecutive.com.ee  Coronavirus health topic: https://www.castaneda.info/  Questions and answers on COVID-19: OpportunityDebt.at  Global tracker: who.sprinklr.com American Academy of Pediatrics (AAP)  Information for families: www.healthychildren.org/English/health-issues/conditions/chest-lungs/Pages/2019-Novel-Coronavirus.aspx The coronavirus situation is changing rapidly. Check your local health authority website or the CDC and [facility] websites for updates and news. When should I contact a health care provider?  Contact your health care provider if you have symptoms of an infection, such as fever or cough, and you: ? Have been near anyone who is known to have coronavirus disease. ? Have come into contact with a person who is suspected to have coronavirus disease. ? Have traveled to an area where there is an outbreak of COVID-19. When should I get emergency medical  care?  Get help right away by calling your local emergency services (911 in the [place] if you have: ? Trouble breathing. ? Pain or pressure in your chest. ? Confusion. ? Blue-tinged lips and fingernails. ? Difficulty waking from sleep. ? Symptoms that get worse. Let the emergency medical personnel know if you think you have coronavirus disease. Summary  A new respiratory virus is spreading from person to person and causing COVID-19 (coronavirus disease).  The virus that causes COVID-19 appears to spread easily. It spreads from one person to another through droplets from breathing, speaking, singing, coughing, or sneezing.  Older adults and those with chronic diseases are at higher risk of disease. If you are at higher risk for complications, take extra precautions.  There is currently no vaccine to prevent coronavirus disease. There are no medicines, such as antibiotics or antivirals, to treat the virus.  You can protect yourself and your family by washing your hands often, avoiding touching your face, and covering your coughs and sneezes. This information is not intended to replace advice given to you by your health care provider. Make sure you discuss any questions you have with your health care provider. Document Revised: 04/12/2019 Document Reviewed: 10/09/2018 Elsevier Patient Education  [place].  COVID-19: How to Protect Yourself and Others Know how it spreads  There is currently no vaccine to prevent coronavirus disease 2019 (COVID-19).  The best way to prevent illness is to avoid being exposed to this virus.  The virus is thought to spread mainly from person-to-person. ? Between people who are in close contact with one another (within about 6 feet). ? Through respiratory droplets produced when an infected person coughs, sneezes or talks. ? These droplets can land in the mouths or noses of people who are nearby or possibly be inhaled into the lungs. ? COVID-19 may  be spread by people who are not showing symptoms. Everyone should Clean your hands often  Wash your hands often with soap and water for at least 20 seconds especially after you have been in a public place, or after blowing your nose, coughing, or sneezing.  If soap and water are not readily available, use a hand sanitizer that contains at least 60% alcohol. Cover all surfaces of your  hands and rub them together until they feel dry.  Avoid touching your eyes, nose, and mouth with unwashed hands. Avoid close contact  Limit contact with others as much as possible.  Avoid close contact with people who are sick.  Put distance between yourself and other people. ? Remember that some people without symptoms may be able to spread virus. ? This is especially important for people who are at higher risk of getting very GainPain.com.cy Cover your mouth and nose with a mask when around others  You could spread COVID-19 to others even if you do not feel sick.  Everyone should wear a mask in public settings and when around people not living in their household, especially when social distancing is difficult to maintain. ? Masks should not be placed on young children under age 65, anyone who has trouble breathing, or is unconscious, incapacitated or otherwise unable to remove the mask without assistance.  The mask is meant to protect other people in case you are infected.  Do NOT use a facemask meant for a Dietitian.  Continue to keep about 6 feet between yourself and others. The mask is not a substitute for social distancing. Cover coughs and sneezes  Always cover your mouth and nose with a tissue when you cough or sneeze or use the inside of your elbow.  Throw used tissues in the trash.  Immediately wash your hands with soap and water for at least 20 seconds. If soap and water are not readily available, clean your  hands with a hand sanitizer that contains at least 60% alcohol. Clean and disinfect  Clean AND disinfect frequently touched surfaces daily. This includes tables, doorknobs, light switches, countertops, handles, desks, phones, keyboards, toilets, faucets, and sinks. RackRewards.fr  If surfaces are dirty, clean them: Use detergent or soap and water prior to disinfection.  Then, use a household disinfectant. You can see a list of EPA-registered household disinfectants here. michellinders.com 02/27/2019 This information is not intended to replace advice given to you by your health care provider. Make sure you discuss any questions you have with your health care provider. Document Revised: 03/07/2019 Document Reviewed: 01/03/2019 Elsevier Patient Education  [place].

## 2019-07-23 NOTE — Telephone Encounter (Signed)
Yes plz cancel Thursday's appt. We will review at [place].

## 2019-07-23 NOTE — Telephone Encounter (Signed)
Pt scheduled on 07/26/19 at 9:00 for virtual hosp f/u.  Pt had 3 mo f/u scheduled on 07/25/19.  Does this need to be r/s or will it be addressed at virtual visit?

## 2019-07-24 ENCOUNTER — Telehealth: Payer: Self-pay

## 2019-07-24 MED ORDER — CHERATUSSIN AC 100-10 MG/5ML PO SOLN: 5.0000 mL | Freq: Three times a day (TID) | ORAL | 0 refills | Status: DC | PRN

## 2019-07-24 NOTE — Telephone Encounter (Addendum)
He has not tolerated morphine or hydrocodone well, has he tolerated codeine in the past? Would he like to try Rx cough syrup with codeine?  If not, would offer tessalon perls for cough.

## 2019-07-24 NOTE — Addendum Note (Signed)
Addended by: Ria Bush on: 07/24/2019 05:27 PM   Modules accepted: Orders

## 2019-07-24 NOTE — Telephone Encounter (Signed)
Noted cough/vomiting, concern for dehydration. Is he nauseated leading to vomiting, or is vomiting due to severe coughing fits?  If nausea, could try nausea medicine  If cough, is he taking anything to suppress cough? May help.  Let me know and we can try one or the other.  Keep pushing small sips of fluid throughout the day.

## 2019-07-24 NOTE — Telephone Encounter (Signed)
Transition Care Management Follow-up Telephone Call  Date of discharge and from where: 07/24/2019, [facility]   How have you been since you were released from the hospital? Patient is not feeling well at all. Patient is vomiting and coughing very bad. Temp is 98.4.   Any questions or concerns? Yes , Patient is vomiting now. He is concerned that he may be dehydrated.   Items Reviewed:  Did the pt receive and understand the discharge instructions provided? Yes   Medications obtained and verified? Yes   Any new allergies since your discharge? No   Dietary orders reviewed? Yes  Do you have support at home? Yes   Functional Questionnaire: (I = Independent and D = Dependent) ADLs: I  Bathing/Dressing- I  Meal Prep- I  Eating- I  Maintaining continence- I  Transferring/Ambulation- I  Managing Meds- I  Follow up appointments reviewed:   PCP Hospital f/u appt confirmed? Yes  Scheduled to see Dr. [name] on 07/26/2019 @ 9 am.  [facility] [facility] f/u appt confirmed? N/A   Are transportation arrangements needed? No   If their condition worsens, is the pt aware to call PCP or go to the Emergency Dept.? Yes  Was the patient provided with contact information for the PCP's office or ED? Yes  Was to pt encouraged to call back with questions or concerns? Yes

## 2019-07-24 NOTE — Telephone Encounter (Signed)
Spoke with Colton Porter relaying Dr. Synthia Innocent message.  Colton Porter says he does not have a problem with codeine and agrees to try cough syrup with codeine.

## 2019-07-24 NOTE — Telephone Encounter (Signed)
Spoke with pt's wife, Suanne Marker, asking about pt.  States vomiting is due to coughing episodes.  Pt is taking Robitussin DM, dexamethasone- 5 day course and using an inhaler.  Says pt is also on 3 L O2.  Encouraged pt to keep pushing small sips of fluid, per Dr. Henriette Combs understanding.

## 2019-07-24 NOTE — Telephone Encounter (Signed)
cheratussin sent in.

## 2019-07-25 ENCOUNTER — Ambulatory Visit: Payer: Medicare HMO | Admitting: Family Medicine

## 2019-07-26 ENCOUNTER — Other Ambulatory Visit: Payer: Self-pay

## 2019-07-26 ENCOUNTER — Ambulatory Visit (INDEPENDENT_AMBULATORY_CARE_PROVIDER_SITE_OTHER): Payer: Medicare HMO | Admitting: Family Medicine

## 2019-07-26 ENCOUNTER — Encounter: Payer: Self-pay | Admitting: Family Medicine

## 2019-07-26 VITALS — HR 67 | Ht 66.0 in

## 2019-07-26 DIAGNOSIS — Z794 Long term (current) use of insulin: Secondary | ICD-10-CM

## 2019-07-26 DIAGNOSIS — R0600 Dyspnea, unspecified: Secondary | ICD-10-CM | POA: Diagnosis not present

## 2019-07-26 DIAGNOSIS — I5032 Chronic diastolic (congestive) heart failure: Secondary | ICD-10-CM

## 2019-07-26 DIAGNOSIS — E440 Moderate protein-calorie malnutrition: Secondary | ICD-10-CM

## 2019-07-26 DIAGNOSIS — E1169 Type 2 diabetes mellitus with other specified complication: Secondary | ICD-10-CM

## 2019-07-26 DIAGNOSIS — J9601 Acute respiratory failure with hypoxia: Secondary | ICD-10-CM

## 2019-07-26 DIAGNOSIS — R0609 Other forms of dyspnea: Secondary | ICD-10-CM

## 2019-07-26 DIAGNOSIS — E785 Hyperlipidemia, unspecified: Secondary | ICD-10-CM

## 2019-07-26 DIAGNOSIS — J1282 Pneumonia due to coronavirus disease 2019: Secondary | ICD-10-CM

## 2019-07-26 DIAGNOSIS — Z944 Liver transplant status: Secondary | ICD-10-CM

## 2019-07-26 DIAGNOSIS — S2232XA Fracture of one rib, left side, initial encounter for closed fracture: Secondary | ICD-10-CM

## 2019-07-26 DIAGNOSIS — U071 COVID-19: Secondary | ICD-10-CM

## 2019-07-26 MED ORDER — FREESTYLE LIBRE READER DEVI: 1.0000 [IU] | 0 refills | Status: DC

## 2019-07-26 MED ORDER — FREESTYLE LIBRE 14 DAY READER DEVI: 1.0000 [IU] | 1 refills | Status: DC

## 2019-07-26 NOTE — Progress Notes (Signed)
Virtual visit completed through [employer]. Due to national recommendations of social distancing due to COVID-19, a virtual visit is felt to be most appropriate for this patient at this time. Reviewed limitations of a virtual visit.   Patient location: home, wife Suanne Marker also home  Provider location: Velora Heckler at [facility], office If any vitals were documented, they were collected by patient at home unless specified below.    Pulse 67   Ht '5\' 6"'$  (1.676 m)   SpO2 91% Comment: 3L  BMI 45.19 kg/m    CC: hosp f/u visit Subjective:    Patient ID: Colton Porter, male    DOB: [birth year], 54 y.o.   MRN: [id number]  HPI: Colton Porter is a 54 y.o. male presenting on 07/26/2019 for Hospitalization Follow-up   Recent hospitalization for Covid-19 pneumonia-related acute hypoxic respiratory failure with associated dyspnea, weakness, fatigue with dehydration and hypoxia. Prior to hospitalization had had several ER visits for progressive weakness and dyspnea. Treated with remdesivir and steroid course. Discharged with home oxygen, currently on 3L O2 by [place]. Extreme dyspnea with ambulation.   Since home persistent cough affecting ability to sleep despite codeine cough syrup.   Prior to latest hospitalization he did have fall with resultant L posterior rib fracture. Question of seizure vs syncope to cause fall - all attributed to Covid.   Initial Covid diagnosis was 07/15/2019. Discussed 3 wks of isolation.  HH not involved.   DM - cbg's staying 260-300. Started checking recently - random throughout the day. Complaint with lantus 15u + amaryl '2mg'$  daily.  Lab Results  Component Value Date   HGBA1C 10.1 (H) 07/18/2019     Admit date: 07/18/2019 Discharge date: 07/23/2019 TCM hosp f/u phone call completed 07/24/2019  Admitted From: home Disposition:  home  Recommendations for Outpatient Follow-up:  1. Follow up with PCP in 1-2 weeks 2. He is to continue Decadron for 5 additional  days 3. Patient discharged on home oxygen  Home Health: None Equipment/Devices: Home O2  Discharge Condition: Stable CODE STATUS: Full code Diet recommendation: Diabetic     Relevant past medical, surgical, family and social history reviewed and updated as indicated. Interim medical history since our last visit reviewed. Allergies and medications reviewed and updated. Outpatient Medications Prior to Visit  Medication Sig Dispense Refill  . acetaminophen (TYLENOL) 500 MG tablet Take 500 mg by mouth every 6 (six) hours as needed for mild pain.    Marland Kitchen albuterol (VENTOLIN HFA) 108 (90 Base) MCG/ACT inhaler Inhale 2 puffs into the lungs every 6 (six) hours as needed for wheezing or shortness of breath. 8 g 0  . allopurinol (ZYLOPRIM) 300 MG tablet Take 900 mg by mouth daily.     Marland Kitchen amLODipine (NORVASC) 10 MG tablet TAKE 1 TABLET EVERY DAY (Patient taking differently: Take 10 mg by mouth daily. ) 90 tablet 2  . ASPIR-LOW 81 MG EC tablet Take 1 tablet (81 mg total) by mouth daily. 90 tablet 3  . Blood Glucose Monitoring Suppl (ACCU-CHEK GUIDE) w/Device KIT 1 Units by Does not apply route as directed. 1 kit 0  . Cholecalciferol (VITAMIN D3) 2000 units TABS Take 1 tablet by mouth at bedtime.    Marland Kitchen dexamethasone (DECADRON) 6 MG tablet Take 1 tablet (6 mg total) by mouth daily. 5 tablet 0  . docusate sodium (COLACE) 100 MG capsule Take 100 mg by mouth at bedtime.     Marland Kitchen doxazosin (CARDURA) 8 MG tablet TAKE 1 TABLET BY MOUTH AT  BEDTIME. (Patient taking differently: Take 8 mg by mouth at bedtime. ) 90 tablet 3  . escitalopram (LEXAPRO) 10 MG tablet TAKE 1 TABLET EVERY DAY (Patient taking differently: Take 10 mg by mouth at bedtime. ) 90 tablet 1  . fluticasone (FLONASE) 50 MCG/ACT nasal spray Place 2 sprays into both nostrils daily. 16 g 6  . furosemide (LASIX) 40 MG tablet TAKE 1 TABLET EVERY DAY (Patient taking differently: Take 40 mg by mouth daily. ) 90 tablet 1  . glimepiride (AMARYL) 2 MG tablet  TAKE 1 TABLET BY MOUTH DAILY WITH BREAKFAST 90 tablet 0  . glucose blood (ACCU-CHEK GUIDE) test strip Check sugars three times daily and as needed when feeling ill E11.65, insulin use 100 each 11  . Insulin Pen Needle 31G X 5 MM MISC USE TO INJECT INSULIN DAILY 100 each 3  . LANCETS ULTRA THIN 30G MISC Check sugars three times daily and as needed when feeling ill E11.65, insulin use 100 each 11  . LANTUS SOLOSTAR 100 UNIT/ML Solostar Pen INJECT 15 UNITS INTO THE SKIN DAILY AT 10 PM (Patient taking differently: Inject 10 Units into the skin at bedtime. ) 15 mL 3  . loratadine (CLARITIN) 10 MG tablet Take 10 mg by mouth daily.    Marland Kitchen losartan (COZAAR) 50 MG tablet Take 1 tablet (50 mg total) by mouth daily. 90 tablet 1  . lovastatin (MEVACOR) 20 MG tablet Take 1 tablet (20 mg total) by mouth once a week. 13 tablet 3  . Multiple Vitamins-Minerals ([employer] SPECTRAVITE PO) Take 1 tablet by mouth at bedtime.    . Omega-3 Fatty Acids (FISH OIL PO) Take 1 tablet by mouth at bedtime.    Marland Kitchen omeprazole (PRILOSEC) 40 MG capsule TAKE 1 CAPSULE EVERY DAY (Patient taking differently: Take 40 mg by mouth daily. ) 90 capsule 1  . tacrolimus (PROGRAF) 1 MG capsule Take 1-2 mg by mouth See admin instructions. Take 2 mg by mouth in the morning, then take 1 mg  by mouth in the evening    . guaiFENesin-codeine (CHERATUSSIN AC) 100-10 MG/5ML syrup Take 5 mLs by mouth 3 (three) times daily as needed for cough. 75 mL 0  . guaiFENesin-dextromethorphan (ROBITUSSIN DM) 100-10 MG/5ML syrup Take 10 mLs by mouth every 4 (four) hours as needed for cough. 118 mL 0   No facility-administered medications prior to visit.     Per HPI unless specifically indicated in ROS section below Review of Systems Objective:    Pulse 67   Ht '5\' 6"'$  (1.676 m)   SpO2 91% Comment: 3L  BMI 45.19 kg/m   Wt Readings from Last 3 Encounters:  07/18/19 280 lb (127 kg)  07/15/19 280 lb (127 kg)  07/12/19 280 lb (127 kg)     Physical exam: Gen:  alert, supplemental O2 by [place], tired but not toxic appearing, sitting in his living room Pulm: speaks in complete sentences without increased work of breathing Psych: normal mood, normal thought content      Lab Results  Component Value Date   HGBA1C 10.1 (H) 07/18/2019    Lab Results  Component Value Date   CREATININE 1.30 (H) 07/22/2019   BUN 36 (H) 07/22/2019   NA 134 (L) 07/22/2019   K 4.7 07/22/2019   CL 101 07/22/2019   CO2 20 (L) 07/22/2019   Lab Results  Component Value Date   ALT 26 07/22/2019   AST 19 07/22/2019   GGT 45 10/26/2015   ALKPHOS 58 07/22/2019  BILITOT 1.4 (H) 07/22/2019    Lab Results  Component Value Date   TSH 0.716 07/18/2019    Lab Results  Component Value Date   WBC 3.5 (L) 07/22/2019   HGB 12.2 (L) 07/22/2019   HCT 36.8 (L) 07/22/2019   MCV 85.0 07/22/2019   PLT 121 (L) 07/22/2019    Assessment & Plan:   Problem List Items Addressed This Visit    Type 2 diabetes mellitus with other specified complication ([place])    Deteriorated control despite lantus, amaryl. Will increase lantus to 20u daily, reassess at [place] next month. He is currently on oral decadron steroid course for covid.  He was prescribed freestlye libre CGM in the hospital - discussed this will likely not be covered by insurance - but will send to pharmacy for he and wife to price out - advised determine cost prior to filling.       Status post liver transplant ([place])   Protein-calorie malnutrition ([place])    As evidenced by low albumin - poor appetite, slowly picking up - continue to monitor.       Pneumonia due to COVID-19 virus [facility] records reviewed with patient. He is now oxygen requiring after Covid19 pneumonia, currently on 3L [place]. Reviewed anticipated course of recovery. Discussed self quarantine duration (total 21 days from positive test). Will schedule in office visit for recheck in 3 wks with evaluation for ongoing supplemental oxygen need. Pt and wife  agree with plan.  cheratussin not helping cough - will stop this       Left rib fracture    Slightly displaced by CT - overall stable period, denies significant pain. Discussed anticipated 6 wk slow recovery       Dyspnea    Ongoing - covid related - on supplemental oxygen.       Dyslipidemia associated with type 2 diabetes mellitus ([place])    Once weekly lovastatin started 05/2019      Acute respiratory failure with hypoxia ([place])    Ongoing oxygen need - continue continuous use. Reassess at [place] in 3 wks.       (HFpEF) heart failure with preserved ejection fraction (HCC)    Continues lasix '40mg'$  daily. Unable to assess fluid status on video visit.           Meds ordered this encounter  Medications  . Continuous Blood Gluc Receiver (FREESTYLE LIBRE 14 DAY READER) DEVI    Sig: 1 Units by Does not apply route as directed.    Dispense:  1 each    Refill:  1  . Continuous Blood Gluc Receiver (FREESTYLE LIBRE READER) DEVI    Sig: 1 Units by Does not apply route as directed.    Dispense:  1 each    Refill:  0   No orders of the defined types were placed in this encounter.   I discussed the assessment and treatment plan with the patient. The patient was provided an opportunity to ask questions and all were answered. The patient agreed with the plan and demonstrated an understanding of the instructions. The patient was advised to call back or seek an in-person evaluation if the symptoms worsen or if the condition fails to improve as anticipated.  Follow up plan: Return in about 4 weeks (around 08/23/2019) for follow up visit.  Ria Bush, MD

## 2019-07-27 ENCOUNTER — Encounter: Payer: Self-pay | Admitting: Family Medicine

## 2019-07-27 DIAGNOSIS — S2232XA Fracture of one rib, left side, initial encounter for closed fracture: Secondary | ICD-10-CM

## 2019-07-27 DIAGNOSIS — E46 Unspecified protein-calorie malnutrition: Secondary | ICD-10-CM | POA: Insufficient documentation

## 2019-07-27 HISTORY — DX: Fracture of one rib, left side, initial encounter for closed fracture: S22.32XA

## 2019-07-27 NOTE — Assessment & Plan Note (Addendum)
Continues lasix 40mg  daily. Unable to assess fluid status on video visit.

## 2019-07-27 NOTE — Assessment & Plan Note (Addendum)
Deteriorated control despite lantus, amaryl. Will increase lantus to 20u daily, reassess at [place] next month. He is currently on oral decadron steroid course for covid.  He was prescribed freestlye libre CGM in the hospital - discussed this will likely not be covered by insurance - but will send to pharmacy for he and wife to price out - advised determine cost prior to filling.

## 2019-07-27 NOTE — Assessment & Plan Note (Signed)
Ongoing - covid related - on supplemental oxygen.

## 2019-07-27 NOTE — Assessment & Plan Note (Signed)
Ongoing oxygen need - continue continuous use. Reassess at [place] in 3 wks.

## 2019-07-27 NOTE — Assessment & Plan Note (Signed)
Slightly displaced by CT - overall stable period, denies significant pain. Discussed anticipated 6 wk slow recovery

## 2019-07-27 NOTE — Assessment & Plan Note (Signed)
As evidenced by low albumin - poor appetite, slowly picking up - continue to monitor.

## 2019-07-27 NOTE — Assessment & Plan Note (Signed)
Once weekly lovastatin started 05/2019

## 2019-07-27 NOTE — Assessment & Plan Note (Addendum)
Hospital records reviewed with patient. He is now oxygen requiring after Covid19 pneumonia, currently on 3L [place]. Reviewed anticipated course of recovery. Discussed self quarantine duration (total 21 days from positive test). Will schedule in office visit for recheck in 3 wks with evaluation for ongoing supplemental oxygen need. Pt and wife agree with plan.  cheratussin not helping cough - will stop this

## 2019-07-30 ENCOUNTER — Inpatient Hospital Stay (HOSPITAL_COMMUNITY)
Admission: EM | Admit: 2019-07-30 | Discharge: 2019-08-02 | DRG: 683 | Disposition: A | Discharge status: Home / Self Care | Payer: Medicare HMO | Attending: Internal Medicine | Admitting: Internal Medicine

## 2019-07-30 ENCOUNTER — Emergency Department (HOSPITAL_COMMUNITY): Payer: Medicare HMO

## 2019-07-30 ENCOUNTER — Encounter (HOSPITAL_COMMUNITY): Payer: Self-pay | Admitting: *Deleted

## 2019-07-30 ENCOUNTER — Other Ambulatory Visit: Payer: Self-pay

## 2019-07-30 DIAGNOSIS — R55 Syncope and collapse: Secondary | ICD-10-CM | POA: Diagnosis present

## 2019-07-30 DIAGNOSIS — I9589 Other hypotension: Secondary | ICD-10-CM | POA: Diagnosis not present

## 2019-07-30 DIAGNOSIS — Z8719 Personal history of other diseases of the digestive system: Secondary | ICD-10-CM

## 2019-07-30 DIAGNOSIS — B948 Sequelae of other specified infectious and parasitic diseases: Secondary | ICD-10-CM

## 2019-07-30 DIAGNOSIS — J80 Acute respiratory distress syndrome: Secondary | ICD-10-CM | POA: Diagnosis not present

## 2019-07-30 DIAGNOSIS — Z7982 Long term (current) use of aspirin: Secondary | ICD-10-CM

## 2019-07-30 DIAGNOSIS — E1165 Type 2 diabetes mellitus with hyperglycemia: Secondary | ICD-10-CM | POA: Diagnosis not present

## 2019-07-30 DIAGNOSIS — N183 Chronic kidney disease, stage 3 unspecified: Secondary | ICD-10-CM | POA: Diagnosis present

## 2019-07-30 DIAGNOSIS — Z6841 Body Mass Index (BMI) 40.0 and over, adult: Secondary | ICD-10-CM

## 2019-07-30 DIAGNOSIS — Z823 Family history of stroke: Secondary | ICD-10-CM

## 2019-07-30 DIAGNOSIS — N179 Acute kidney failure, unspecified: Secondary | ICD-10-CM | POA: Diagnosis not present

## 2019-07-30 DIAGNOSIS — I959 Hypotension, unspecified: Secondary | ICD-10-CM | POA: Diagnosis present

## 2019-07-30 DIAGNOSIS — Z89022 Acquired absence of left finger(s): Secondary | ICD-10-CM

## 2019-07-30 DIAGNOSIS — I10 Essential (primary) hypertension: Secondary | ICD-10-CM | POA: Diagnosis present

## 2019-07-30 DIAGNOSIS — E1140 Type 2 diabetes mellitus with diabetic neuropathy, unspecified: Secondary | ICD-10-CM | POA: Diagnosis not present

## 2019-07-30 DIAGNOSIS — Z794 Long term (current) use of insulin: Secondary | ICD-10-CM | POA: Diagnosis not present

## 2019-07-30 DIAGNOSIS — Z86010 Personal history of colonic polyps: Secondary | ICD-10-CM

## 2019-07-30 DIAGNOSIS — M7989 Other specified soft tissue disorders: Secondary | ICD-10-CM | POA: Diagnosis not present

## 2019-07-30 DIAGNOSIS — Z944 Liver transplant status: Secondary | ICD-10-CM

## 2019-07-30 DIAGNOSIS — G4733 Obstructive sleep apnea (adult) (pediatric): Secondary | ICD-10-CM | POA: Diagnosis present

## 2019-07-30 DIAGNOSIS — E785 Hyperlipidemia, unspecified: Secondary | ICD-10-CM | POA: Diagnosis present

## 2019-07-30 DIAGNOSIS — R7989 Other specified abnormal findings of blood chemistry: Secondary | ICD-10-CM | POA: Diagnosis not present

## 2019-07-30 DIAGNOSIS — M545 Low back pain: Secondary | ICD-10-CM | POA: Diagnosis present

## 2019-07-30 DIAGNOSIS — E861 Hypovolemia: Secondary | ICD-10-CM | POA: Diagnosis not present

## 2019-07-30 DIAGNOSIS — R0902 Hypoxemia: Secondary | ICD-10-CM | POA: Diagnosis present

## 2019-07-30 DIAGNOSIS — Z888 Allergy status to other drugs, medicaments and biological substances status: Secondary | ICD-10-CM

## 2019-07-30 DIAGNOSIS — Z825 Family history of asthma and other chronic lower respiratory diseases: Secondary | ICD-10-CM

## 2019-07-30 DIAGNOSIS — I5032 Chronic diastolic (congestive) heart failure: Secondary | ICD-10-CM | POA: Diagnosis present

## 2019-07-30 DIAGNOSIS — Z79899 Other long term (current) drug therapy: Secondary | ICD-10-CM

## 2019-07-30 DIAGNOSIS — E860 Dehydration: Secondary | ICD-10-CM | POA: Diagnosis present

## 2019-07-30 DIAGNOSIS — U071 COVID-19: Secondary | ICD-10-CM | POA: Diagnosis not present

## 2019-07-30 DIAGNOSIS — I130 Hypertensive heart and chronic kidney disease with heart failure and stage 1 through stage 4 chronic kidney disease, or unspecified chronic kidney disease: Secondary | ICD-10-CM | POA: Diagnosis present

## 2019-07-30 DIAGNOSIS — Z8249 Family history of ischemic heart disease and other diseases of the circulatory system: Secondary | ICD-10-CM

## 2019-07-30 DIAGNOSIS — R569 Unspecified convulsions: Secondary | ICD-10-CM | POA: Diagnosis not present

## 2019-07-30 DIAGNOSIS — J309 Allergic rhinitis, unspecified: Secondary | ICD-10-CM | POA: Diagnosis present

## 2019-07-30 DIAGNOSIS — R0602 Shortness of breath: Secondary | ICD-10-CM | POA: Diagnosis not present

## 2019-07-30 DIAGNOSIS — I951 Orthostatic hypotension: Secondary | ICD-10-CM | POA: Diagnosis not present

## 2019-07-30 DIAGNOSIS — E1169 Type 2 diabetes mellitus with other specified complication: Secondary | ICD-10-CM | POA: Diagnosis present

## 2019-07-30 DIAGNOSIS — K219 Gastro-esophageal reflux disease without esophagitis: Secondary | ICD-10-CM | POA: Diagnosis present

## 2019-07-30 DIAGNOSIS — Z743 Need for continuous supervision: Secondary | ICD-10-CM | POA: Diagnosis not present

## 2019-07-30 DIAGNOSIS — Z9981 Dependence on supplemental oxygen: Secondary | ICD-10-CM | POA: Diagnosis not present

## 2019-07-30 DIAGNOSIS — Z801 Family history of malignant neoplasm of trachea, bronchus and lung: Secondary | ICD-10-CM

## 2019-07-30 DIAGNOSIS — F329 Major depressive disorder, single episode, unspecified: Secondary | ICD-10-CM | POA: Diagnosis present

## 2019-07-30 DIAGNOSIS — Z885 Allergy status to narcotic agent status: Secondary | ICD-10-CM

## 2019-07-30 DIAGNOSIS — E1122 Type 2 diabetes mellitus with diabetic chronic kidney disease: Secondary | ICD-10-CM | POA: Diagnosis present

## 2019-07-30 DIAGNOSIS — R279 Unspecified lack of coordination: Secondary | ICD-10-CM | POA: Diagnosis not present

## 2019-07-30 DIAGNOSIS — I469 Cardiac arrest, cause unspecified: Secondary | ICD-10-CM | POA: Diagnosis not present

## 2019-07-30 DIAGNOSIS — Z87891 Personal history of nicotine dependence: Secondary | ICD-10-CM

## 2019-07-30 DIAGNOSIS — J1282 Pneumonia due to coronavirus disease 2019: Secondary | ICD-10-CM

## 2019-07-30 DIAGNOSIS — M1A9XX0 Chronic gout, unspecified, without tophus (tophi): Secondary | ICD-10-CM | POA: Diagnosis present

## 2019-07-30 DIAGNOSIS — F1021 Alcohol dependence, in remission: Secondary | ICD-10-CM | POA: Diagnosis present

## 2019-07-30 LAB — BASIC METABOLIC PANEL
Anion gap: 12 (ref 5–15)
BUN: 37 mg/dL — ABNORMAL HIGH (ref 6–20)
CO2: 21 mmol/L — ABNORMAL LOW (ref 22–32)
Calcium: 8.6 mg/dL — ABNORMAL LOW (ref 8.9–10.3)
Chloride: 98 mmol/L (ref 98–111)
Creatinine, Ser: 1.97 mg/dL — ABNORMAL HIGH (ref 0.61–1.24)
GFR calc Af Amer: 44 mL/min — ABNORMAL LOW (ref >60)
GFR calc non Af Amer: 38 mL/min — ABNORMAL LOW (ref >60)
Glucose, Bld: 322 mg/dL — ABNORMAL HIGH (ref 70–99)
Potassium: 3.8 mmol/L (ref 3.5–5.1)
Sodium: 131 mmol/L — ABNORMAL LOW (ref 135–145)

## 2019-07-30 LAB — CBC
HCT: 38.2 % — ABNORMAL LOW (ref 39.0–52.0)
Hemoglobin: 12.9 g/dL — ABNORMAL LOW (ref 13.0–17.0)
MCH: 28.6 pg (ref 26.0–34.0)
MCHC: 33.8 g/dL (ref 30.0–36.0)
MCV: 84.7 fL (ref 80.0–100.0)
Platelets: 170 10*3/uL (ref 150–400)
RBC: 4.51 MIL/uL (ref 4.22–5.81)
RDW: 13.5 % (ref 11.5–15.5)
WBC: 8.5 10*3/uL (ref 4.0–10.5)
nRBC: 0 % (ref 0.0–0.2)

## 2019-07-30 LAB — TROPONIN I (HIGH SENSITIVITY)
Troponin I (High Sensitivity): 5 ng/L (ref <18)
Troponin I (High Sensitivity): 5 ng/L (ref <18)

## 2019-07-30 LAB — LACTIC ACID, PLASMA: Lactic Acid, Venous: 1.5 mmol/L (ref 0.5–1.9)

## 2019-07-30 MED ORDER — FENTANYL CITRATE (PF) 100 MCG/2ML IJ SOLN
50.0000 ug | Freq: Once | INTRAMUSCULAR | Status: AC
  Administered 2019-07-30: 50 ug via INTRAVENOUS
  Filled 2019-07-30: qty 2

## 2019-07-30 MED ORDER — ENOXAPARIN SODIUM 40 MG/0.4ML ~~LOC~~ SOLN
40.0000 mg | Freq: Every day | SUBCUTANEOUS | Status: DC
  Administered 2019-07-31: 40 mg via SUBCUTANEOUS
  Filled 2019-07-30: qty 0.4

## 2019-07-30 MED ORDER — INSULIN ASPART 100 UNIT/ML ~~LOC~~ SOLN
0.0000 [IU] | Freq: Every day | SUBCUTANEOUS | Status: DC
  Administered 2019-07-31: 3 [IU] via SUBCUTANEOUS
  Administered 2019-07-31: 2 [IU] via SUBCUTANEOUS
  Administered 2019-08-01: 3 [IU] via SUBCUTANEOUS

## 2019-07-30 MED ORDER — ESCITALOPRAM OXALATE 10 MG PO TABS
10.0000 mg | ORAL_TABLET | Freq: Every day | ORAL | Status: DC
  Administered 2019-07-31 – 2019-08-02 (×4): 10 mg via ORAL
  Filled 2019-07-30 (×4): qty 1

## 2019-07-30 MED ORDER — SODIUM CHLORIDE 0.9 % IV BOLUS (SEPSIS)
1000.0000 mL | Freq: Once | INTRAVENOUS | Status: AC
  Administered 2019-07-30: 1000 mL via INTRAVENOUS

## 2019-07-30 MED ORDER — SODIUM CHLORIDE 0.9% FLUSH
3.0000 mL | Freq: Two times a day (BID) | INTRAVENOUS | Status: DC
  Administered 2019-07-31 – 2019-08-02 (×6): 3 mL via INTRAVENOUS

## 2019-07-30 MED ORDER — PANTOPRAZOLE SODIUM 40 MG PO TBEC
80.0000 mg | DELAYED_RELEASE_TABLET | Freq: Every day | ORAL | Status: DC
  Administered 2019-07-31 – 2019-08-02 (×3): 80 mg via ORAL
  Filled 2019-07-30 (×4): qty 2

## 2019-07-30 MED ORDER — ACETAMINOPHEN 325 MG PO TABS
650.0000 mg | ORAL_TABLET | Freq: Four times a day (QID) | ORAL | Status: DC | PRN
  Administered 2019-08-02: 650 mg via ORAL
  Filled 2019-07-30: qty 2

## 2019-07-30 MED ORDER — ALBUTEROL SULFATE HFA 108 (90 BASE) MCG/ACT IN AERS
2.0000 | INHALATION_SPRAY | Freq: Four times a day (QID) | RESPIRATORY_TRACT | Status: DC | PRN
  Filled 2019-07-30: qty 6.7

## 2019-07-30 MED ORDER — PRAVASTATIN SODIUM 10 MG PO TABS
20.0000 mg | ORAL_TABLET | Freq: Every day | ORAL | Status: DC
  Administered 2019-07-31 – 2019-08-01 (×2): 20 mg via ORAL
  Filled 2019-07-30 (×2): qty 2

## 2019-07-30 MED ORDER — INSULIN GLARGINE 100 UNIT/ML SOLOSTAR PEN: 10.0000 [IU] | PEN_INJECTOR | Freq: Every day | SUBCUTANEOUS | Status: DC

## 2019-07-30 MED ORDER — ACETAMINOPHEN 650 MG RE SUPP: 650.0000 mg | Freq: Four times a day (QID) | RECTAL | Status: DC | PRN

## 2019-07-30 MED ORDER — ASPIRIN EC 81 MG PO TBEC
81.0000 mg | DELAYED_RELEASE_TABLET | Freq: Every day | ORAL | Status: DC
  Administered 2019-07-31 – 2019-08-02 (×3): 81 mg via ORAL
  Filled 2019-07-30 (×3): qty 1

## 2019-07-30 MED ORDER — ONDANSETRON HCL 4 MG/2ML IJ SOLN: 4.0000 mg | Freq: Four times a day (QID) | INTRAMUSCULAR | Status: DC | PRN

## 2019-07-30 MED ORDER — ONDANSETRON HCL 4 MG PO TABS: 4.0000 mg | ORAL_TABLET | Freq: Four times a day (QID) | ORAL | Status: DC | PRN

## 2019-07-30 MED ORDER — SODIUM CHLORIDE 0.9 % IV BOLUS: 1000.0000 mL | Freq: Once | INTRAVENOUS | Status: DC

## 2019-07-30 MED ORDER — INSULIN ASPART 100 UNIT/ML ~~LOC~~ SOLN
0.0000 [IU] | Freq: Three times a day (TID) | SUBCUTANEOUS | Status: DC
  Administered 2019-07-31: 5 [IU] via SUBCUTANEOUS
  Administered 2019-07-31: 3 [IU] via SUBCUTANEOUS
  Administered 2019-07-31: 8 [IU] via SUBCUTANEOUS
  Administered 2019-08-01: 2 [IU] via SUBCUTANEOUS
  Administered 2019-08-01 (×2): 5 [IU] via SUBCUTANEOUS
  Administered 2019-08-02 (×2): 3 [IU] via SUBCUTANEOUS

## 2019-07-30 MED ORDER — TACROLIMUS 1 MG PO CAPS
2.0000 mg | ORAL_CAPSULE | Freq: Every morning | ORAL | Status: DC
  Administered 2019-07-31 – 2019-08-02 (×3): 2 mg via ORAL
  Filled 2019-07-30 (×3): qty 2

## 2019-07-30 MED ORDER — FLUTICASONE PROPIONATE 50 MCG/ACT NA SUSP
2.0000 | Freq: Every day | NASAL | Status: DC
  Administered 2019-08-01 – 2019-08-02 (×2): 2 via NASAL
  Filled 2019-07-30 (×2): qty 16

## 2019-07-30 MED ORDER — SODIUM CHLORIDE 0.9 % IV BOLUS
1000.0000 mL | Freq: Once | INTRAVENOUS | Status: AC
  Administered 2019-07-30: 1000 mL via INTRAVENOUS

## 2019-07-30 NOTE — ED Triage Notes (Signed)
Pt from home for syncopal episode x 2 witnessed by his wife. Pt currently A&Ox4. Reports he attempted to go to the bathroom tonight and had a syncopal episode, unknown if pt fell tonight. Pt had a fall last week with fx rib on the L. C/o back pain. Wife reported seizure like activity with syncope. Pt reports dizziness with position changes. 543ml NS given enroute. Pt discharged from [place] last week; new oxygen dependency at 3L since being discharged with covid

## 2019-07-30 NOTE — ED Provider Notes (Signed)
[facility] EMERGENCY DEPARTMENT Provider Note   CSN: [id number] Arrival date & time: 07/30/19  2006     History Chief Complaint  Patient presents with  . Loss of Consciousness    Colton Porter is a 54 y.o. male.  HPI   54 year old male with syncope.  2 episodes earlier today while trying to ambulate to the bathroom.  Patient is unsure of exactly what happened.  He just had a fall last week resulting in a rib fracture and some lower back pain.  Recently left [facility] when he was admitted with Covid.  He was discharged with oxygen.  He is on 3 L.  He states his breathing has been about the same since the time of discharge.  Is not acutely worse today.  States his appetite has been fair.  He has been having diarrhea.  No fevers.  Past Medical History:  Diagnosis Date  . Alcohol dependence ([place]) 07/11/2012  . Alcoholic cirrhosis of liver with ascites ([place]) 07/2014   s./p transplant 12/2015  . Allergy   . Anemia   . Cellulitis of left leg   . Chronic diastolic heart failure ([place]) 11/01/2013  . CKD (chronic kidney disease) stage 3, GFR 30-59 ml/min 11/04/2015  . Diabetes mellitus without complication ([place])   . GERD (gastroesophageal reflux disease)   . Hyperlipidemia   . Hypertension   . Neuropathy   . OSA on CPAP 07/11/2012   HST 07/2013:  AHI 39/hr.    . Pneumonia due to COVID-19 virus 06/2019  . Thrombocytopenia ([place]) 12/15/2011    Patient Active Problem List   Diagnosis Date Noted  . Protein-calorie malnutrition ([place]) 07/27/2019  . Left rib fracture 07/27/2019  . Acute respiratory failure with hypoxia ([place]) 07/18/2019  . Pneumonia due to COVID-19 virus 07/18/2019  . [place]obe lesion, left 02/05/2018  . Chronic heel pain, left 10/14/2017  . Anejaculation 08/09/2017  . Chronic gout 05/22/2017  . Foot swelling 04/12/2017  . Disorder of ejaculation 04/06/2017  . Dyspnea 11/18/2016  . BPH (benign prostatic hyperplasia) 10/05/2016  . Status post  liver transplant ([place]) 03/15/2016  . Portal hypertensive gastropathy ([place]) 12/04/2015  . Type 2 diabetes mellitus with other specified complication ([place]) 97/98/9211  . Gynecomastia   . CKD (chronic kidney disease) stage 3, GFR 30-59 ml/min (HCC) 11/04/2015  . Depression, major, single episode, moderate ([place]) 07/09/2015  . Headache 02/19/2015  . Insomnia 12/06/2014  . Hypomagnesemia   . Other fatigue 11/18/2014  . Internal hemorrhoid 10/01/2014  . Erectile dysfunction 10/01/2014  . Alcoholic cirrhosis of liver without ascites ([place]) 07/28/2014  . (HFpEF) heart failure with preserved ejection fraction ([place]) 11/01/2013  . Essential hypertension 06/04/2013  . OSA on CPAP 07/11/2012  . History of alcohol dependence ([place]) 07/11/2012  . Thrombocytopenia ([place]) 12/15/2011  . Morbid obesity with BMI of 50.0-59.9, adult ([place]) 11/16/2011  . Routine general medical examination at a health care facility 09/22/2010  . NEUROPATHY 05/27/2009  . Pedal edema 09/22/2008  . Dyslipidemia associated with type 2 diabetes mellitus ([place]) 02/15/2007  . Allergic rhinitis 02/15/2007  . GERD 02/15/2007    Past Surgical History:  Procedure Laterality Date  . COLONOSCOPY  08/2013   hyperplastic polyps, hemorrhoids Ardis Hughs)  . 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 FINGER AMPUTATION  1997   left 4th finger - radial arm saw  . LEFT AND RIGHT HEART CATHETERIZATION WITH CORONARY ANGIOGRAM N/A 06/10/2013   Procedure: LEFT AND RIGHT  HEART CATHETERIZATION WITH CORONARY ANGIOGRAM;  Surgeon: Blane Ohara, MD;  Location: [facility] CATH LAB;  Service: Cardiovascular;  Laterality: N/A;  . LIVER TRANSPLANTATION  05/4579   alcoholic cirrhosis ([facility] at [facility])       Family History  Problem Relation Age of Onset  . Stroke Mother   . Emphysema Mother   . Hypertension Father   . Heart disease Father   . Emphysema Father   . Lung cancer Maternal Aunt   . Stroke Paternal  Grandmother   . Heart attack Neg Hx     Social History   Tobacco Use  . Smoking status: Never Smoker  . Smokeless tobacco: Former Systems developer    Types: Chew  Substance Use Topics  . Alcohol use: No    Alcohol/week: 0.0 standard drinks    Comment: 4-6 drinks daily - NO ETOH SINCE APRIL 2016  . Drug use: Not Currently    Comment: remote use of marijuana in the past, has since quit    Home Medications Prior to Admission medications   Medication Sig Start Date End Date Taking? Authorizing Provider  acetaminophen (TYLENOL) 500 MG tablet Take 500 mg by mouth every 6 (six) hours as needed for mild pain.    [provider]  albuterol (VENTOLIN HFA) 108 (90 Base) MCG/ACT inhaler Inhale 2 puffs into the lungs every 6 (six) hours as needed for wheezing or shortness of breath. 07/23/19   Caren Griffins, MD  allopurinol (ZYLOPRIM) 300 MG tablet Take 900 mg by mouth daily.  09/18/18   [provider]  amLODipine (NORVASC) 10 MG tablet TAKE 1 TABLET EVERY DAY Patient taking differently: Take 10 mg by mouth daily.  03/14/19   Ria Bush, MD  ASPIR-LOW 81 MG EC tablet Take 1 tablet (81 mg total) by mouth daily. 04/06/17   Ria Bush, MD  Blood Glucose Monitoring Suppl (ACCU-CHEK GUIDE) w/Device KIT 1 Units by Does not apply route as directed. 06/29/17   Ria Bush, MD  Cholecalciferol (VITAMIN D3) 2000 units TABS Take 1 tablet by mouth at bedtime.    [provider]  Continuous Blood Gluc Receiver (FREESTYLE LIBRE 14 DAY READER) DEVI 1 Units by Does not apply route as directed. 07/26/19   Ria Bush, MD  Continuous Blood Gluc Receiver (FREESTYLE LIBRE READER) DEVI 1 Units by Does not apply route as directed. 07/26/19   Ria Bush, MD  dexamethasone (DECADRON) 6 MG tablet Take 1 tablet (6 mg total) by mouth daily. 07/23/19   Caren Griffins, MD  docusate sodium (COLACE) 100 MG capsule Take 100 mg by mouth at bedtime.     [provider]    doxazosin (CARDURA) 8 MG tablet TAKE 1 TABLET BY MOUTH AT BEDTIME. Patient taking differently: Take 8 mg by mouth at bedtime.  01/11/19   Ria Bush, MD  escitalopram (LEXAPRO) 10 MG tablet TAKE 1 TABLET EVERY DAY Patient taking differently: Take 10 mg by mouth at bedtime.  04/17/19   Ria Bush, MD  fluticasone [facility]) 50 MCG/ACT nasal spray Place 2 sprays into both nostrils daily. 04/23/19   Ria Bush, MD  furosemide (LASIX) 40 MG tablet TAKE 1 TABLET EVERY DAY Patient taking differently: Take 40 mg by mouth daily.  04/12/19   Ria Bush, MD  glimepiride (AMARYL) 2 MG tablet TAKE 1 TABLET BY MOUTH DAILY WITH BREAKFAST 07/23/19   Ria Bush, MD  glucose blood (ACCU-CHEK GUIDE) test strip Check sugars three times daily and as needed when feeling ill  E11.65, insulin use 06/29/17   Ria Bush, MD  Insulin Pen Needle 31G X 5 MM MISC USE TO INJECT INSULIN DAILY 07/30/18   Ria Bush, MD  LANCETS ULTRA THIN 30G MISC Check sugars three times daily and as needed when feeling ill E11.65, insulin use 06/29/17   Ria Bush, MD  LANTUS SOLOSTAR 100 UNIT/ML Solostar Pen INJECT 15 UNITS INTO THE SKIN DAILY AT 10 PM Patient taking differently: Inject 10 Units into the skin at bedtime.  05/30/19   Ria Bush, MD  loratadine (CLARITIN) 10 MG tablet Take 10 mg by mouth daily.    [provider]  losartan (COZAAR) 50 MG tablet Take 1 tablet (50 mg total) by mouth daily. 05/13/19   Ria Bush, MD  lovastatin (MEVACOR) 20 MG tablet Take 1 tablet (20 mg total) by mouth once a week. 06/19/19   Ria Bush, MD  Multiple Vitamins-Minerals ([employer] SPECTRAVITE PO) Take 1 tablet by mouth at bedtime.    [provider]  Omega-3 Fatty Acids (FISH OIL PO) Take 1 tablet by mouth at bedtime.    [provider]  omeprazole (PRILOSEC) 40 MG capsule TAKE 1 CAPSULE EVERY DAY Patient taking differently: Take 40 mg by mouth daily.  05/25/19    Ria Bush, MD  tacrolimus (PROGRAF) 1 MG capsule Take 1-2 mg by mouth See admin instructions. Take 2 mg by mouth in the morning, then take 1 mg  by mouth in the evening    [provider]    Allergies    Tolmetin, Ambien [zolpidem tartrate], Morphine and related, Hydrochlorothiazide w-triamterene, and Lisinopril  Review of Systems   Review of Systems All systems reviewed and negative, other than as noted in HPI.  Physical Exam Updated Vital Signs BP (!) 82/56   Pulse 87   Temp 98.5 F (36.9 C) (Oral)   Resp (!) 22   SpO2 99%   Physical Exam Vitals and nursing note reviewed.  Constitutional:      Appearance: He is well-developed. He is obese.  HENT:     Head: Normocephalic and atraumatic.  Eyes:     General:        Right eye: No discharge.        Left eye: No discharge.     Conjunctiva/sclera: Conjunctivae normal.  Cardiovascular:     Rate and Rhythm: Normal rate and regular rhythm.     Heart sounds: Normal heart sounds. No murmur. No friction rub. No gallop.   Pulmonary:     Effort: Pulmonary effort is normal. No respiratory distress.     Breath sounds: Normal breath sounds.  Abdominal:     General: There is no distension.     Palpations: Abdomen is soft.     Tenderness: There is no abdominal tenderness.  Musculoskeletal:        General: No tenderness.     Cervical back: Neck supple.  Skin:    General: Skin is warm and dry.  Neurological:     General: No focal deficit present.     Mental Status: He is alert and oriented to person, place, and time.     Cranial Nerves: No cranial nerve deficit.     Motor: No weakness.  Psychiatric:        Behavior: Behavior normal.        Thought Content: Thought content normal.     ED Results / Procedures / Treatments   Labs (all labs ordered are listed, but only abnormal results are displayed)  Labs Reviewed  BASIC METABOLIC PANEL - Abnormal; Notable for the following components:      Result Value    Sodium 131 (*)    CO2 21 (*)    Glucose, Bld 322 (*)    BUN 37 (*)    Creatinine, Ser 1.97 (*)    Calcium 8.6 (*)    GFR calc non Af Amer 38 (*)    GFR calc Af Amer 44 (*)    All other components within normal limits  CBC - Abnormal; Notable for the following components:   Hemoglobin 12.9 (*)    HCT 38.2 (*)    All other components within normal limits  CULTURE, BLOOD (ROUTINE X 2)  CULTURE, BLOOD (ROUTINE X 2)  LACTIC ACID, PLASMA  LACTIC ACID, PLASMA  TROPONIN I (HIGH SENSITIVITY)  TROPONIN I (HIGH SENSITIVITY)    EKG:  Rhythm: Normal sinus Rate: 88  QRS: 94 ms QTc: 449 ms ST segments: NS ST changes  Radiology DG Chest Portable 1 View  Result Date: 07/30/2019 CLINICAL DATA:  54 year old male with shortness of breath and chest pain. EXAM: PORTABLE CHEST 1 VIEW COMPARISON:  Chest radiograph dated 07/21/2019. FINDINGS: Overall interval improvement of bilateral airspace opacities, most consistent with pneumonia, likely viral or atypical in etiology. No pleural effusion or pneumothorax. Stable cardiomediastinal silhouette. No acute osseous pathology. IMPRESSION: Improved bilateral airspace infiltrates. Electronically Signed   By: Anner Crete M.D.   On: 07/30/2019 20:35    Procedures Procedures (including critical care time)  Medications Ordered in ED Medications  sodium chloride 0.9 % bolus 1,000 mL (has no administration in time range)    ED Course  I have reviewed the triage vital signs and the nursing notes.  Pertinent labs & imaging results that were available during my care of the patient were reviewed by me and considered in my medical decision making (see chart for details).    MDM Rules/Calculators/A&P                      54 year old male with 2 syncopal events earlier today.  Arrived hypotensive. May potentially be dehydration.  AKI. He reports only fair p.o. intake recently and also diarrhea. Remained hypotensive after 500cc of NS prehospital and an  additional 1L in the ED. Given an additional liter of NS and seems to be responding.  Is complaining of some lower back pain but this is been ongoing since recent fall.  He had a CT the abdomen pelvis during his recent hospitalization which did not show an AAA.    Final Clinical Impression(s) / ED Diagnoses Final diagnoses:  Syncope, unspecified syncope type  Hypotension due to hypovolemia  AKI (acute kidney injury) [facility])    Rx / DC Orders ED Discharge Orders    None       Virgel Manifold, MD 07/30/19 2330

## 2019-07-31 ENCOUNTER — Other Ambulatory Visit (HOSPITAL_COMMUNITY): Payer: Medicare HMO

## 2019-07-31 ENCOUNTER — Observation Stay (HOSPITAL_COMMUNITY): Payer: Medicare HMO

## 2019-07-31 DIAGNOSIS — I130 Hypertensive heart and chronic kidney disease with heart failure and stage 1 through stage 4 chronic kidney disease, or unspecified chronic kidney disease: Secondary | ICD-10-CM | POA: Diagnosis present

## 2019-07-31 DIAGNOSIS — N179 Acute kidney failure, unspecified: Secondary | ICD-10-CM | POA: Diagnosis present

## 2019-07-31 DIAGNOSIS — R55 Syncope and collapse: Secondary | ICD-10-CM

## 2019-07-31 DIAGNOSIS — I9589 Other hypotension: Secondary | ICD-10-CM | POA: Diagnosis not present

## 2019-07-31 DIAGNOSIS — Z794 Long term (current) use of insulin: Secondary | ICD-10-CM | POA: Diagnosis not present

## 2019-07-31 DIAGNOSIS — M1A9XX0 Chronic gout, unspecified, without tophus (tophi): Secondary | ICD-10-CM | POA: Diagnosis present

## 2019-07-31 DIAGNOSIS — N183 Chronic kidney disease, stage 3 unspecified: Secondary | ICD-10-CM | POA: Diagnosis present

## 2019-07-31 DIAGNOSIS — E860 Dehydration: Secondary | ICD-10-CM | POA: Diagnosis present

## 2019-07-31 DIAGNOSIS — E1169 Type 2 diabetes mellitus with other specified complication: Secondary | ICD-10-CM

## 2019-07-31 DIAGNOSIS — E1140 Type 2 diabetes mellitus with diabetic neuropathy, unspecified: Secondary | ICD-10-CM | POA: Diagnosis present

## 2019-07-31 DIAGNOSIS — F329 Major depressive disorder, single episode, unspecified: Secondary | ICD-10-CM | POA: Diagnosis present

## 2019-07-31 DIAGNOSIS — Z6841 Body Mass Index (BMI) 40.0 and over, adult: Secondary | ICD-10-CM | POA: Diagnosis not present

## 2019-07-31 DIAGNOSIS — E785 Hyperlipidemia, unspecified: Secondary | ICD-10-CM | POA: Diagnosis present

## 2019-07-31 DIAGNOSIS — M7989 Other specified soft tissue disorders: Secondary | ICD-10-CM | POA: Diagnosis not present

## 2019-07-31 DIAGNOSIS — E861 Hypovolemia: Secondary | ICD-10-CM

## 2019-07-31 DIAGNOSIS — J309 Allergic rhinitis, unspecified: Secondary | ICD-10-CM | POA: Diagnosis present

## 2019-07-31 DIAGNOSIS — E1165 Type 2 diabetes mellitus with hyperglycemia: Secondary | ICD-10-CM | POA: Diagnosis present

## 2019-07-31 DIAGNOSIS — R7989 Other specified abnormal findings of blood chemistry: Secondary | ICD-10-CM | POA: Diagnosis not present

## 2019-07-31 DIAGNOSIS — M545 Low back pain: Secondary | ICD-10-CM | POA: Diagnosis present

## 2019-07-31 DIAGNOSIS — B948 Sequelae of other specified infectious and parasitic diseases: Secondary | ICD-10-CM | POA: Diagnosis not present

## 2019-07-31 DIAGNOSIS — I951 Orthostatic hypotension: Secondary | ICD-10-CM | POA: Diagnosis present

## 2019-07-31 DIAGNOSIS — I10 Essential (primary) hypertension: Secondary | ICD-10-CM

## 2019-07-31 DIAGNOSIS — Z944 Liver transplant status: Secondary | ICD-10-CM

## 2019-07-31 DIAGNOSIS — R0902 Hypoxemia: Secondary | ICD-10-CM | POA: Diagnosis present

## 2019-07-31 DIAGNOSIS — E1122 Type 2 diabetes mellitus with diabetic chronic kidney disease: Secondary | ICD-10-CM | POA: Diagnosis present

## 2019-07-31 DIAGNOSIS — G4733 Obstructive sleep apnea (adult) (pediatric): Secondary | ICD-10-CM | POA: Diagnosis present

## 2019-07-31 DIAGNOSIS — I5032 Chronic diastolic (congestive) heart failure: Secondary | ICD-10-CM | POA: Diagnosis present

## 2019-07-31 DIAGNOSIS — K219 Gastro-esophageal reflux disease without esophagitis: Secondary | ICD-10-CM | POA: Diagnosis present

## 2019-07-31 DIAGNOSIS — Z9981 Dependence on supplemental oxygen: Secondary | ICD-10-CM | POA: Diagnosis not present

## 2019-07-31 LAB — BASIC METABOLIC PANEL
Anion gap: 9 (ref 5–15)
BUN: 32 mg/dL — ABNORMAL HIGH (ref 6–20)
CO2: 22 mmol/L (ref 22–32)
Calcium: 8.1 mg/dL — ABNORMAL LOW (ref 8.9–10.3)
Chloride: 103 mmol/L (ref 98–111)
Creatinine, Ser: 1.6 mg/dL — ABNORMAL HIGH (ref 0.61–1.24)
GFR calc Af Amer: 57 mL/min — ABNORMAL LOW (ref >60)
GFR calc non Af Amer: 49 mL/min — ABNORMAL LOW (ref >60)
Glucose, Bld: 277 mg/dL — ABNORMAL HIGH (ref 70–99)
Potassium: 3.7 mmol/L (ref 3.5–5.1)
Sodium: 134 mmol/L — ABNORMAL LOW (ref 135–145)

## 2019-07-31 LAB — CBG MONITORING, ED
Glucose-Capillary: 248 mg/dL — ABNORMAL HIGH (ref 70–99)
Glucose-Capillary: 254 mg/dL — ABNORMAL HIGH (ref 70–99)
Glucose-Capillary: 263 mg/dL — ABNORMAL HIGH (ref 70–99)

## 2019-07-31 LAB — CBC
HCT: 36.3 % — ABNORMAL LOW (ref 39.0–52.0)
Hemoglobin: 12.1 g/dL — ABNORMAL LOW (ref 13.0–17.0)
MCH: 28 pg (ref 26.0–34.0)
MCHC: 33.3 g/dL (ref 30.0–36.0)
MCV: 84 fL (ref 80.0–100.0)
Platelets: 161 10*3/uL (ref 150–400)
RBC: 4.32 MIL/uL (ref 4.22–5.81)
RDW: 13.5 % (ref 11.5–15.5)
WBC: 8.1 10*3/uL (ref 4.0–10.5)
nRBC: 0 % (ref 0.0–0.2)

## 2019-07-31 LAB — HEPATIC FUNCTION PANEL
ALT: 22 U/L (ref 0–44)
AST: 14 U/L — ABNORMAL LOW (ref 15–41)
Albumin: 2.4 g/dL — ABNORMAL LOW (ref 3.5–5.0)
Alkaline Phosphatase: 70 U/L (ref 38–126)
Bilirubin, Direct: 0.2 mg/dL (ref 0.0–0.2)
Indirect Bilirubin: 0.7 mg/dL (ref 0.3–0.9)
Total Bilirubin: 0.9 mg/dL (ref 0.3–1.2)
Total Protein: 5.8 g/dL — ABNORMAL LOW (ref 6.5–8.1)

## 2019-07-31 LAB — URINALYSIS, ROUTINE W REFLEX MICROSCOPIC
Bilirubin Urine: NEGATIVE
Glucose, UA: 150 mg/dL — AB
Hgb urine dipstick: NEGATIVE
Ketones, ur: NEGATIVE mg/dL
Leukocytes,Ua: NEGATIVE
Nitrite: NEGATIVE
Protein, ur: NEGATIVE mg/dL
Specific Gravity, Urine: 1.019 (ref 1.005–1.030)
pH: 5 (ref 5.0–8.0)

## 2019-07-31 LAB — ECHOCARDIOGRAM COMPLETE

## 2019-07-31 LAB — LACTIC ACID, PLASMA: Lactic Acid, Venous: 1.1 mmol/L (ref 0.5–1.9)

## 2019-07-31 LAB — GLUCOSE, CAPILLARY: Glucose-Capillary: 229 mg/dL — ABNORMAL HIGH (ref 70–99)

## 2019-07-31 LAB — D-DIMER, QUANTITATIVE: D-Dimer, Quant: 1.76 ug/mL-FEU — ABNORMAL HIGH (ref 0.00–0.50)

## 2019-07-31 MED ORDER — TACROLIMUS 1 MG PO CAPS
1.0000 mg | ORAL_CAPSULE | Freq: Every day | ORAL | Status: DC
  Administered 2019-07-31 – 2019-08-01 (×2): 1 mg via ORAL
  Filled 2019-07-31 (×4): qty 1

## 2019-07-31 MED ORDER — LACTATED RINGERS IV SOLN: INTRAVENOUS | Status: AC

## 2019-07-31 MED ORDER — LACTATED RINGERS IV BOLUS
500.0000 mL | Freq: Once | INTRAVENOUS | Status: AC
  Administered 2019-07-31: 500 mL via INTRAVENOUS

## 2019-07-31 MED ORDER — ENOXAPARIN SODIUM 60 MG/0.6ML ~~LOC~~ SOLN
60.0000 mg | Freq: Every day | SUBCUTANEOUS | Status: DC
  Administered 2019-08-01 – 2019-08-02 (×2): 60 mg via SUBCUTANEOUS
  Filled 2019-07-31 (×3): qty 0.6

## 2019-07-31 MED ORDER — INSULIN GLARGINE 100 UNIT/ML ~~LOC~~ SOLN
10.0000 [IU] | Freq: Every day | SUBCUTANEOUS | Status: DC
  Administered 2019-07-31: 10 [IU] via SUBCUTANEOUS
  Filled 2019-07-31: qty 0.1

## 2019-07-31 NOTE — Progress Notes (Signed)
PROGRESS NOTE                                                                                                                                                                                                             Patient Demographics:    Colton Porter, is a 54 y.o. male, DOB - 10/03/1965, TSV:[id number]  Outpatient Primary MD for the patient is Ria Bush, MD    LOS - 0  Admit date - 07/30/2019    Chief Complaint  Patient presents with  . Loss of Consciousness       Brief Narrative  Colton Porter is a 54 y.o. male with medical history significant of cirrhosis s/p liver transplant in 2017 now just on tacrolimus, DM2, HTN, CKD stage 3. Patient was diagnosed with COVID on 1/18. Patient was admitted for hypoxic resp failure due to COVID to [facility] from 1/21 to 1/26.  Discharged home on 3L O2 due to persistent hypoxia.  He was discharged on oral steroids which she finished on 07/29/2019 thereafter he became lightheaded at home and presented to the ER hypotensive and orthostatic.  Was admitted again.   Subjective:    Colton Porter today has, No headache, No chest pain, No abdominal pain - No Nausea, No new weakness tingling or numbness, no SOB   Assessment  & Plan :     1. Acute Hypoxic Resp. Failure due to Acute Covid 19 Viral Pneumonitis during the ongoing 2020 Covid 19 Pandemic - this problem has been adequately treated last admission, currently on 1 L nasal cannula oxygen and stable.  Encouraged the patient to sit up in chair in the daytime use I-S and flutter valve for pulmonary toiletry and then prone in bed when at night.   Recent Labs  Lab 07/30/19 2359  DDIMER 1.76*    Hepatic Function Latest Ref Rng & Units 07/31/2019 07/22/2019 07/21/2019  Total Protein 6.5 - 8.1 g/dL 5.8(L) 7.3 7.1  Albumin 3.5 - 5.0 g/dL 2.4(L) 3.2(L) 3.1(L)  AST 15 - 41 U/L 14(L) 19 18  ALT 0 - 44 U/L _0 Alk Phosphatase 38 -  126 U/L 70 58 54  Total Bilirubin 0.3 - 1.2 mg/dL 0.9 1.4(H) 0.9  Bilirubin, Direct 0.0 - 0.2 mg/dL 0.2 - -       2.  Syncope.  Due  to dehydration and orthostatic hypotension, hydrate and monitor.  Stable on telemetry.  Recent echo reviewed from 2017 below.  3.  Dehydration, hypotension with AKI.  Hold nephrotoxins, hold diuretics, IV fluid.  Any further drop in blood pressure despite hydration will add stress dose steroids as he was recently on steroids.  4.  S/p liver transplant.  Monitor LFTs continue tacrolimus.  5.  DM type II.  Currently on Lantus and sliding scale.  Oral medications held.  Lab Results  Component Value Date   HGBA1C 10.1 (H) 07/18/2019   CBG (last 3)  Recent Labs    07/31/19 0643 07/31/19 0825 07/31/19 1014  GLUCAP 254* 248* 229*     Condition - Fair  Family Communication  :  None  Code Status :  Full  Diet :   Diet Order            Diet Carb Modified Fluid consistency: Thin; Room service appropriate? Yes  Diet effective now               Disposition Plan  : [place] - Tele ( admitted few hrs ago)  Consults  :  No ne  Procedures  :    TTE 2017   - Left ventricle: The cavity size was normal. There was mild concentric hypertrophy. Systolic function was normal. The estimated ejection fraction was in the range of 55% to 60%. Wall motion was normal; there were no regional wall motion abnormalities. Features are consistent with a pseudonormal left ventricular filling pattern, with concomitant abnormal relaxation and increased filling pressure (grade 2 diastolic dysfunction). Doppler parameters are consistent with elevated ventricular end-diastolic filling pressure.  - Aortic valve: Trileaflet; normal thickness leaflets. There was no regurgitation.  - Aortic root: The aortic root was normal in size.  - Mitral valve: Structurally normal valve. There was no regurgitation.  - Left atrium: The atrium was mildly dilated.  - Right ventricle: The  cavity size was normal. Wall thickness was normal. Systolic function was normal.  - Right atrium: The atrium was normal in size.  - Tricuspid valve: There was no regurgitation.  - Pulmonic valve: There was no regurgitation.  - Pulmonary arteries: Systolic pressure could not be accurately estimated.  - Inferior vena cava: The vessel was normal in size.  - Pericardium, extracardiac: There was no pericardial effusion.   PUD Prophylaxis : PPI  DVT Prophylaxis  :  Lovenox    Lab Results  Component Value Date   PLT 161 07/31/2019    Inpatient Medications  Scheduled Meds: . aspirin EC  81 mg Oral Daily  . enoxaparin (LOVENOX) injection  40 mg Subcutaneous Daily  . escitalopram  10 mg Oral QHS  . fluticasone  2 spray Each Nare Daily  . insulin aspart  0-15 Units Subcutaneous TID WC  . insulin aspart  0-5 Units Subcutaneous QHS  . Insulin Glargine  10 Units Subcutaneous QHS  . pantoprazole  80 mg Oral Daily  . pravastatin  20 mg Oral q1800  . sodium chloride flush  3 mL Intravenous Q12H  . tacrolimus  1-2 mg Oral See admin instructions   Continuous Infusions: . lactated ringers    . lactated ringers     PRN Meds:.acetaminophen **OR** acetaminophen, albuterol, ondansetron **OR** ondansetron (ZOFRAN) IV  Antibiotics  :    Anti-infectives (From admission, onward)   None       Time Spent in minutes  30   Lala Lund M.D on 07/31/2019 at 8:30 AM  To page go to [email or web address] - password [facility]  [facility] -  Office  [phone]     See all Orders from today for further details    Objective:   Vitals:   07/31/19 0515 07/31/19 0530 07/31/19 0545 07/31/19 0600  BP: 131/78 128/77 112/64 119/73  Pulse: 91 89 92 100  Resp: (!) _0 (!) 22  Temp:      TempSrc:      SpO2: 96% 97% 92% 96%    Wt Readings from Last 3 Encounters:  07/18/19 127 kg  07/15/19 127 kg  07/12/19 127 kg     Intake/Output Summary (Last 24 hours) at 07/31/2019 0830 Last data filed  at 07/30/2019 2304 Gross per 24 hour  Intake 2000 ml  Output --  Net 2000 ml     Physical Exam  Awake Alert,  No new F.N deficits, Normal affect [place].AT,PERRAL Supple Neck,No JVD, No cervical lymphadenopathy appriciated.  Symmetrical Chest wall movement, Good air movement bilaterally, CTAB RRR,No Gallops,Rubs or new Murmurs, No Parasternal Heave +ve B.Sounds, Abd Soft, No tenderness, No organomegaly appriciated, No rebound - guarding or rigidity. No Cyanosis, Clubbing or edema, No new Rash or bruise     Data Review:    CBC Recent Labs  Lab 07/30/19 2018 07/31/19 0249  WBC 8.5 8.1  HGB 12.9* 12.1*  HCT 38.2* 36.3*  PLT 170 161  MCV 84.7 84.0  MCH 28.6 28.0  MCHC 33.8 33.3  RDW 13.5 13.5    Chemistries  Recent Labs  Lab 07/30/19 2018 07/31/19 0130 07/31/19 0249  NA 131*  --  134*  K 3.8  --  3.7  CL 98  --  103  CO2 21*  --  22  GLUCOSE 322*  --  277*  BUN 37*  --  32*  CREATININE 1.97*  --  1.60*  CALCIUM 8.6*  --  8.1*  AST  --  14*  --   ALT  --  22  --   ALKPHOS  --  70  --   BILITOT  --  0.9  --    ------------------------------------------------------------------------------------------------------------------ No results for input(s): CHOL, HDL, LDLCALC, TRIG, CHOLHDL, LDLDIRECT in the last 72 hours.  Lab Results  Component Value Date   HGBA1C 10.1 (H) 07/18/2019   ------------------------------------------------------------------------------------------------------------------ No results for input(s): TSH, T4TOTAL, T3FREE, THYROIDAB in the last 72 hours.  Invalid input(s): FREET3  Cardiac Enzymes No results for input(s): CKMB, TROPONINI, MYOGLOBIN in the last 168 hours.  Invalid input(s): CK ------------------------------------------------------------------------------------------------------------------    Component Value Date/Time   BNP 181.0 (H) 07/19/2019 1237   BNP 20.3 06/08/2016 1206    Micro Results No results found for this or  any previous visit (from the past 240 hour(s)).  Radiology Reports CT ABDOMEN PELVIS WO CONTRAST  Result Date: 07/17/2019 CLINICAL DATA:  Recent fall with left flank pain, initial encounter EXAM: CT ABDOMEN AND PELVIS WITHOUT CONTRAST TECHNIQUE: Multidetector CT imaging of the abdomen and pelvis was performed following the standard protocol without IV contrast. COMPARISON:  None. FINDINGS: Lower chest: Lung bases demonstrate patchy ground-glass opacities particularly in the right lower lobe consistent with the given clinical history of COVID-19 positivity. Hepatobiliary: Fatty infiltration of the liver is noted. The gallbladder has been surgically removed. Pancreas: Unremarkable. No pancreatic ductal dilatation or surrounding inflammatory changes. Spleen: Normal in size without focal abnormality. Adrenals/Urinary Tract: Adrenal glands are within normal limits. Kidneys are well visualized bilaterally. No renal calculi or urinary tract obstructive changes are  seen. The bladder is decompressed. Stomach/Bowel: Colon is well visualized and decompressed. The appendix is within normal limits. No small bowel abnormality is noted. The stomach is unremarkable. Vascular/Lymphatic: Aortic atherosclerosis. No enlarged abdominal or pelvic lymph nodes. Reproductive: Prostate is unremarkable. Other: No free fluid is noted. Some fat containing anterior abdominal wall hernias are seen to the right of the midline in area of previous surgery. Musculoskeletal: Mildly displaced fracture of the left eleventh rib is noted centrally. No other definitive rib fractures are seen. No compression deformities are noted. Degenerative changes of the lower thoracic spine are seen. IMPRESSION: Eleventh rib fracture posteriorly near the costovertebral junction without complicating factors. Patchy ground-glass opacities particularly in the right lower lobe consistent with the given clinical history of COVID-19 positivity. No other focal  abnormality is seen. Electronically Signed   By: Inez Catalina M.D.   On: 07/17/2019 13:52   DG Chest 2 View  Result Date: 07/15/2019 CLINICAL DATA:  Generalized body aches. EXAM: CHEST - 2 VIEW COMPARISON:  June 10, 2016. FINDINGS: The heart size and mediastinal contours are within normal limits. Both lungs are clear. The visualized skeletal structures are unremarkable. IMPRESSION: No active cardiopulmonary disease. Electronically Signed   By: Marijo Conception M.D.   On: 07/15/2019 12:57   CT Head Wo Contrast  Result Date: 07/18/2019 CLINICAL DATA:  Seizure.  COVID-19 positive EXAM: CT HEAD WITHOUT CONTRAST TECHNIQUE: Contiguous axial images were obtained from the base of the skull through the vertex without intravenous contrast. COMPARISON:  None. FINDINGS: Brain: Ventricles and sulci are normal in size and configuration. There is no intracranial mass, hemorrhage, extra-axial fluid collection, or midline shift. Brain parenchyma appears unremarkable. No evident acute infarct. Vascular: No hyperdense vessel. No appreciable vascular calcification. Skull: The bony calvarium appears intact. Sinuses/Orbits: There is mucosal thickening in several ethmoid air cells. There is mild rightward deviation of the nasal septum. There is a concha bullosa on the left, an anatomic variant. Orbits appear symmetric bilaterally. Other: Mastoid air cells on the left are clear. There is opacification of multiple mastoid air cells on the right. IMPRESSION: Brain parenchyma appears unremarkable.  No mass or hemorrhage. Opacification of multiple right-sided mastoid air cells. There is mucosal thickening in several ethmoid air cells. Deviated nasal septum noted. Electronically Signed   By: Lowella Grip III M.D.   On: 07/18/2019 13:08   CT L-SPINE NO CHARGE  Result Date: 07/17/2019 CLINICAL DATA:  Left flank pain.  History of liver transplant. EXAM: CT LUMBAR SPINE WITHOUT CONTRAST TECHNIQUE: Multidetector CT imaging of the  lumbar spine was performed without intravenous contrast administration. Multiplanar CT image reconstructions were also generated. COMPARISON:  None. FINDINGS: Segmentation: Normal Alignment: Normal Vertebrae: Negative for lumbar fracture or mass. Paraspinal and other soft tissues: Negative for paraspinous mass or adenopathy. Disc levels: L1-2: Mild degenerative change.  Negative for stenosis L2-3: Mild disc and mild facet degeneration.  Negative for stenosis L3-4: Mild disc and mild facet degeneration.  Negative for stenosis L4-5: Mild disc bulging and mild facet degeneration. Negative for stenosis L5-S1: Negative IMPRESSION: Mild lumbar degenerative change. No acute abnormality. Negative for neural impingement or stenosis. Electronically Signed   By: Franchot Gallo M.D.   On: 07/17/2019 13:51   DG Chest Portable 1 View  Result Date: 07/30/2019 CLINICAL DATA:  54 year old male with shortness of breath and chest pain. EXAM: PORTABLE CHEST 1 VIEW COMPARISON:  Chest radiograph dated 07/21/2019. FINDINGS: Overall interval improvement of bilateral airspace opacities, most consistent with pneumonia,  likely viral or atypical in etiology. No pleural effusion or pneumothorax. Stable cardiomediastinal silhouette. No acute osseous pathology. IMPRESSION: Improved bilateral airspace infiltrates. Electronically Signed   By: Anner Crete M.D.   On: 07/30/2019 20:35   DG CHEST PORT 1 VIEW  Result Date: 07/21/2019 CLINICAL DATA:  Hypoxemia EXAM: PORTABLE CHEST 1 VIEW COMPARISON:  07/18/2019 FINDINGS: Bilateral patchy interstitial and alveolar airspace disease which has increased compared with the prior exam. No pleural effusion or pneumothorax. Stable cardiomediastinal silhouette. No aggressive osseous lesion. IMPRESSION: Worsening bilateral interstitial and alveolar airspace disease concerning for progression of multilobar pneumonia/atypical viral pneumonia versus pulmonary edema. Electronically Signed   By: Kathreen Devoid    On: 07/21/2019 10:54   DG Chest Port 1 View  Result Date: 07/18/2019 CLINICAL DATA:  Seizure witnessed by family. COVID positive. Rib fracture yesterday. EXAM: PORTABLE CHEST 1 VIEW COMPARISON:  07/17/2019 FINDINGS: Lungs are adequately inflated and demonstrate subtle stable hazy perihilar opacification which may be due to edema or infection. No effusion. Mild stable cardiomegaly. Remainder the exam is unchanged. IMPRESSION: Subtle stable hazy perihilar opacification which may be due to mild vascular congestion or infection. Mild stable cardiomegaly. Electronically Signed   By: Marin Olp M.D.   On: 07/18/2019 12:57   DG Chest Portable 1 View  Result Date: 07/17/2019 CLINICAL DATA:  Fever and shortness of breath.  COVID positive. EXAM: PORTABLE CHEST 1 VIEW COMPARISON:  Chest x-ray dated July 15, 2019. FINDINGS: The heart size and mediastinal contours are within normal limits. Normal pulmonary vascularity. Slightly increased markings at the right lung base. No pleural effusion or pneumothorax. No acute osseous abnormality. IMPRESSION: Slightly increased markings at the right lung base may reflect pneumonia. Electronically Signed   By: Titus Dubin M.D.   On: 07/17/2019 12:22

## 2019-07-31 NOTE — ED Notes (Signed)
Report given to Miquel Dunn, Therapist, sports at [facility]. All questions answered.

## 2019-07-31 NOTE — ED Notes (Signed)
CBG 263

## 2019-07-31 NOTE — ED Notes (Signed)
Urine collected, labeled with 2 pt identifiers, and sent to lab 

## 2019-07-31 NOTE — ED Notes (Signed)
Available meds given per MAR. Name/DOB verified with pt 

## 2019-07-31 NOTE — ED Notes (Signed)
Assumed care of pt. Pt alert, resting on cart in NAD. VSS on monitors. Equal rise and fall of chest noted. Pt speaking in full sentences. Call light within reach. Hourly rounds completed. Will continue to monitor

## 2019-07-31 NOTE — Progress Notes (Signed)
  Echocardiogram 2D Echocardiogram has been performed.  Colton Porter 07/31/2019, 2:05 PM

## 2019-07-31 NOTE — ED Notes (Signed)
[facility] called for ETA. Unable to give ETA a this time, but states pt is number 5 on list

## 2019-07-31 NOTE — ED Notes (Signed)
Called [facility] for transport to [facility]--Aking Klabunde  

## 2019-07-31 NOTE — ED Notes (Signed)
Pt remains resting on cart in NAD. Equal rise and fall of chest noted. Ice water provided. Call light within reach. Hourly rounds completed. Will continue to monitor

## 2019-07-31 NOTE — ED Notes (Signed)
Phlebotomy at bedside.

## 2019-07-31 NOTE — H&P (Signed)
History and Physical    Colton Porter CBS:[id number] DOB: 1966-06-07 DOA: 07/30/2019  PCP: Ria Bush, MD  Patient coming from: Home  I have personally briefly reviewed patient's old medical records in [place]  Chief Complaint: Syncope  HPI: TREV BOLEY is a 54 y.o. male with medical history significant of cirrhosis s/p liver transplant in 2017 now just on tacrolimus, DM2, HTN, CKD stage 3.  Patient was diagnosed with COVID on 1/18.  Patient was admitted for hypoxic resp failure due to COVID to [facility] from 1/21 to 1/26.  Discharged home on 3L O2 due to persistent hypoxia.  Finished remdesivir while hospitalized, finished decadron after discharge (last dose on Sunday).  Today had 2 episodes of syncope while trying to ambulate to bathroom.  Breathing has been stable since discharge and is unchanged he reports.  No increased SOB today.  Appetite has been fair but has been having diarrhea at home.   ED Course: Initially SBP in the 70s, has improved to 110s after 2L IVF bolus in ED.  Bun 37 and Creat of 1.9 up from 1.3 on discharge.  BGL 322.   Review of Systems: As per HPI, otherwise all review of systems negative.  Past Medical History:  Diagnosis Date  . Alcohol dependence ([place]) 07/11/2012  . Alcoholic cirrhosis of liver with ascites ([place]) 07/2014   s./p transplant 12/2015  . Allergy   . Anemia   . Cellulitis of left leg   . Chronic diastolic heart failure ([place]) 11/01/2013  . CKD (chronic kidney disease) stage 3, GFR 30-59 ml/min 11/04/2015  . Diabetes mellitus without complication ([place])   . GERD (gastroesophageal reflux disease)   . Hyperlipidemia   . Hypertension   . Neuropathy   . OSA on CPAP 07/11/2012   HST 07/2013:  AHI 39/hr.    . Pneumonia due to COVID-19 virus 06/2019  . Thrombocytopenia ([place]) 12/15/2011    Past Surgical History:  Procedure Laterality Date  . COLONOSCOPY  08/2013   hyperplastic polyps, hemorrhoids Ardis Hughs)  .  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 FINGER AMPUTATION  1997   left 4th finger - radial arm saw  . LEFT AND RIGHT HEART CATHETERIZATION WITH CORONARY ANGIOGRAM N/A 06/10/2013   Procedure: LEFT AND RIGHT HEART CATHETERIZATION WITH CORONARY ANGIOGRAM;  Surgeon: Blane Ohara, MD;  Location[facility] C[facility] LAB;  Service: Cardiovascular;  Laterality: N/A;  . LIVER TRANSPLANTATION  84/6659   alcoholic cirrhosis ([facility])     reports that he has never smoked. He has quit using smokeless tobacco.  His smokeless tobacco use included chew. He reports previous drug use. He reports that he does not drink alcohol.  Allergies  Allergen Reactions  . Tolmetin Rash    cirrhosis  . Ambien [Zolpidem Tartrate] Other (See Comments)    Over-toxicity from liver failure  . Morphine And Related     Hallucinations.  . Hydrochlorothiazide W-Triamterene Other (See Comments)    REACTION: dizzy, nausea  . Lisinopril Other (See Comments)    REACTION: cough, decreased libido    Family History  Problem Relation Age of Onset  . Stroke Mother   . Emphysema Mother   . Hypertension Father   . Heart disease Father   . Emphysema Father   . Lung cancer Maternal Aunt   . Stroke Paternal Grandmother   . Heart attack Neg Hx      Prior to Admission medications   Medication Sig Start  Date End Date Taking? Authorizing Provider  acetaminophen (TYLENOL) 500 MG tablet Take 500 mg by mouth every 6 (six) hours as needed for mild pain.   Yes [provider]  albuterol (VENTOLIN HFA) 108 (90 Base) MCG/ACT inhaler Inhale 2 puffs into the lungs every 6 (six) hours as needed for wheezing or shortness of breath. 07/23/19  Yes Gherghe, Vella Redhead, MD  allopurinol (ZYLOPRIM) 300 MG tablet Take 300 mg by mouth daily.  09/18/18  Yes [provider]  amLODipine (NORVASC) 10 MG tablet TAKE 1 TABLET EVERY DAY Patient taking differently: Take 10 mg by mouth daily.   03/14/19  Yes Ria Bush, MD  ASPIR-LOW 81 MG EC tablet Take 1 tablet (81 mg total) by mouth daily. 04/06/17  Yes Ria Bush, MD  Cholecalciferol (VITAMIN D3) 2000 units TABS Take 1 tablet by mouth at bedtime.   Yes [provider]  dextromethorphan-guaiFENesin (TUSSIN DM COUGH + CHEST) 10-100 MG/5ML liquid Take 10 mLs by mouth every 4 (four) hours as needed for cough.   Yes [provider]  docusate sodium (COLACE) 100 MG capsule Take 100 mg by mouth daily as needed for mild constipation.    Yes [provider]  doxazosin (CARDURA) 8 MG tablet TAKE 1 TABLET BY MOUTH AT BEDTIME. Patient taking differently: Take 8 mg by mouth at bedtime.  01/11/19  Yes Ria Bush, MD  escitalopram (LEXAPRO) 10 MG tablet TAKE 1 TABLET EVERY DAY Patient taking differently: Take 10 mg by mouth at bedtime.  04/17/19  Yes Ria Bush, MD  fluticasone [facility]) 50 MCG/ACT nasal spray Place 2 sprays into both nostrils daily. Patient taking differently: Place 2 sprays into both nostrils daily as needed for allergies.  04/23/19  Yes Ria Bush, MD  furosemide (LASIX) 40 MG tablet TAKE 1 TABLET EVERY DAY Patient taking differently: Take 40 mg by mouth daily.  04/12/19  Yes Ria Bush, MD  glimepiride (AMARYL) 2 MG tablet TAKE 1 TABLET BY MOUTH DAILY WITH BREAKFAST 07/23/19  Yes Ria Bush, MD  LANTUS SOLOSTAR 100 UNIT/ML Solostar Pen INJECT 15 UNITS INTO THE SKIN DAILY AT 10 PM Patient taking differently: Inject 20 Units into the skin at bedtime.  05/30/19  Yes Ria Bush, MD  loratadine (CLARITIN) 10 MG tablet Take 10 mg by mouth daily.   Yes [provider]  losartan (COZAAR) 50 MG tablet Take 1 tablet (50 mg total) by mouth daily. 05/13/19  Yes Ria Bush, MD  lovastatin (MEVACOR) 20 MG tablet Take 1 tablet (20 mg total) by mouth once a week. 06/19/19  Yes Ria Bush, MD  Multiple Vitamins-Minerals ([employer] SPECTRAVITE PO)  Take 1 tablet by mouth at bedtime.   Yes [provider]  Omega-3 Fatty Acids (FISH OIL PO) Take 1 tablet by mouth at bedtime.   Yes [provider]  omeprazole (PRILOSEC) 40 MG capsule TAKE 1 CAPSULE EVERY DAY Patient taking differently: Take 40 mg by mouth daily.  05/25/19  Yes Ria Bush, MD  tacrolimus (PROGRAF) 1 MG capsule Take 1-2 mg by mouth See admin instructions. Take 2 mg by mouth in the morning, then take 1 mg  by mouth in the evening   Yes [provider]  Blood Glucose Monitoring Suppl (ACCU-CHEK GUIDE) w/Device KIT 1 Units by Does not apply route as directed. 06/29/17   Ria Bush, MD  Continuous Blood Gluc Receiver (FREESTYLE LIBRE 14 DAY READER) DEVI 1 Units by Does not apply route as directed. 07/26/19   Ria Bush, MD  Continuous Blood Gluc Receiver (FREESTYLE LIBRE READER) DEVI 1 Units by Does not apply route as directed. 07/26/19   Ria Bush, MD  glucose blood (ACCU-CHEK GUIDE) test strip Check sugars three times daily and as needed when feeling ill E11.65, insulin use 06/29/17   Ria Bush, MD  Insulin Pen Needle 31G X 5 MM MISC USE TO INJECT INSULIN DAILY 07/30/18   Ria Bush, MD  LANCETS ULTRA THIN 30G MISC Check sugars three times daily and as needed when feeling ill E11.65, insulin use 06/29/17   Ria Bush, MD    Physical Exam: Vitals:   07/30/19 2315 07/30/19 2330 07/30/19 2345 07/31/19 0000  BP: 121/65 118/70 119/69 128/79  Pulse: 83 85 83 88  Resp: '19 19 19 '$ (!) 22  Temp:      TempSrc:      SpO2: 98% 96% 96% 97%    Constitutional: NAD, calm, comfortable Eyes: PERRL, lids and conjunctivae normal ENMT: Mucous membranes are moist. Posterior pharynx clear of any exudate or lesions.Normal dentition.  Neck: normal, supple, no masses, no thyromegaly Respiratory: clear to auscultation bilaterally, no wheezing, no crackles. Normal respiratory effort. No accessory muscle use.  Cardiovascular: Regular  rate and rhythm, no murmurs / rubs / gallops. No extremity edema. 2+ pedal pulses. No carotid bruits.  Abdomen: no tenderness, no masses palpated. No hepatosplenomegaly. Bowel sounds positive.  Musculoskeletal: no clubbing / cyanosis. No joint deformity upper and lower extremities. Good ROM, no contractures. Normal muscle tone.  Skin: no rashes, lesions, ulcers. No induration Neurologic: CN 2-12 grossly intact. Sensation intact, DTR normal. Strength 5/5 in all 4.  Psychiatric: Normal judgment and insight. Alert and oriented x 3. Normal mood.    Labs on Admission: I have personally reviewed following labs and imaging studies  CBC: Recent Labs  Lab 07/30/19 2018  WBC 8.5  HGB 12.9*  HCT 38.2*  MCV 84.7  PLT 381   Basic Metabolic Panel: Recent Labs  Lab 07/30/19 2018  NA 131*  K 3.8  CL 98  CO2 21*  GLUCOSE 322*  BUN 37*  CREATININE 1.97*  CALCIUM 8.6*   GFR: Estimated Creatinine Clearance: 55.3 mL/min (A) (by C-G formula based on SCr of 1.97 mg/dL (H)). Liver Function Tests: No results for input(s): AST, ALT, ALKPHOS, BILITOT, PROT, ALBUMIN in the last 168 hours. No results for input(s): LIPASE, AMYLASE in the last 168 hours. No results for input(s): AMMONIA in the last 168 hours. Coagulation Profile: No results for input(s): INR, PROTIME in the last 168 hours. Cardiac Enzymes: No results for input(s): CKTOTAL, CKMB, CKMBINDEX, TROPONINI in the last 168 hours. BNP (last 3 results) No results for input(s): PROBNP in the last 8760 hours. HbA1C: No results for input(s): HGBA1C in the last 72 hours. CBG: Recent Labs  Lab 07/31/19 0038  GLUCAP 263*   Lipid Profile: No results for input(s): CHOL, HDL, LDLCALC, TRIG, CHOLHDL, LDLDIRECT in the last 72 hours. Thyroid Function Tests: No results for input(s): TSH, T4TOTAL, FREET4, T3FREE, THYROIDAB in the last 72 hours. Anemia Panel: No results for input(s): VITAMINB12, FOLATE, FERRITIN, TIBC, IRON, RETICCTPCT in the last  72 hours. Urine analysis:    Component Value Date/Time   COLORURINE AMBER (A) 07/15/2019 1910   APPEARANCEUR HAZY (A) 07/15/2019 1910   LABSPEC 1.017 07/15/2019 1910   PHURINE 5.0 07/15/2019 1910   GLUCOSEU NEGATIVE 07/15/2019 1910   HGBUR NEGATIVE 07/15/2019 1910   BILIRUBINUR NEGATIVE 07/15/2019 1910   BILIRUBINUR NEG 01/19/2017 [place] 07/15/2019  1910   PROTEINUR 30 (A) 07/15/2019 1910   UROBILINOGEN 0.2 01/19/2017 1059   UROBILINOGEN 1.0 11/21/2014 1655   NITRITE NEGATIVE 07/15/2019 1910   LEUKOCYTESUR NEGATIVE 07/15/2019 1910    Radiological Exams on Admission: DG Chest Portable 1 View  Result Date: 07/30/2019 CLINICAL DATA:  54 year old male with shortness of breath and chest pain. EXAM: PORTABLE CHEST 1 VIEW COMPARISON:  Chest radiograph dated 07/21/2019. FINDINGS: Overall interval improvement of bilateral airspace opacities, most consistent with pneumonia, likely viral or atypical in etiology. No pleural effusion or pneumothorax. Stable cardiomediastinal silhouette. No acute osseous pathology. IMPRESSION: Improved bilateral airspace infiltrates. Electronically Signed   By: Anner Crete M.D.   On: 07/30/2019 20:35    EKG: Independently reviewed.  Assessment/Plan Principal Problem:   Syncope Active Problems:   Essential hypertension   Hypotension   AKI (acute kidney injury) ([place])   Type 2 diabetes mellitus with other specified complication ([place])   Status post liver transplant ([place])    1. Syncope - 1. Likely due to hypotension (see below) 2. Syncope pathway 3. 2d echo 4. Tele monitor 5. Treat hypotension (see below). 2. Hypotension - with associated AKI 1. Suspect dehydration / diarrhea / decreased PO intake all due to COVID 2. Hold all BP meds 3. IVF: 2L NS bolus in ED, BP already improved to 342A systolic, will hold off on further IVF for the moment and see how he continues to respond to the 2L already given 4. If BP drops again: resume  IVF 3. AKI - 1. 2L IVF bolus as above 2. Strict intake and output 3. Repeat BMP in AM 4. If renal function not improving: call nephrology in AM 5. Check UA 4. DM2 - 1. Hold home PO hypoglycemics 2. Continue Lantus 10u 3. Mod scale SSI AC/HS 5. S/p Liver transplant - 1. Check LFTs 2. Continue tacrolimus  DVT prophylaxis: Lovenox Code Status: Full Family Communication: No family in room Disposition Plan: Home after admit Consults called: None Admission status: Place in 32    Alandria Butkiewicz, [place] Hospitalists  How to contact the [facility] Attending or Consulting provider [place] or covering provider during after hours [place], for this patient?  1. Check the care team in [facility] and look for a) attending/consulting TR[facility] provider listed and b) the [facility] team listed 2. Log into [email or web address]  Amion Physician Scheduling and messaging for groups and whole hospitals  On call and physician scheduling software for group practices, residents, hospitalists and other medical providers for call, clinic, rotation and shift schedules. OnCall [employer] is a hospital-wide system for scheduling doctors and paging doctors on call. EasyPlot is for scientific plotting and data analysis.  [email or web address]  and use [place]'s universal password to access. If you do not have the password, please contact the hospital operator.  3. Locate the [facility] provider you are looking for under [facility] and page to a number that you can be directly reached. 4. If you still have difficulty reaching the provider, please page the [facility] (Director on Call) for the Hospitalists listed on amion for assistance.  07/31/2019, 12:44 AM

## 2019-07-31 NOTE — Progress Notes (Signed)
Occupational Therapy Evaluation Patient Details Name: Colton Porter MRN: [id number] DOB: 01-19-1966 Today's Date: 07/31/2019    History of Present Illness Colton Porter is a 54 y.o. male with medical history significant of cirrhosis s/p liver transplant in 2017 now just on tacrolimus, DM2, HTN, CKD stage 3. Patient was diagnosed with COVID on 1/18. Patient was admitted for hypoxic resp failure due to COVID to [facility] from 1/21 to 1/26.  Discharged home on 3L O2 due to persistent hypoxia.  He was discharged on oral steroids which she finished on 07/29/2019 thereafter he became lightheaded at home and presented to the ER hypotensive and orthostatic.  Was admitted again.   Clinical Impression   Patient up in chair on arrival, he had just gone to bathroom on his own and nurse came in to help him.  He lives at home with wife and was independent prior to getting COVID.  Since leaving hospital 1/26 he has needed help bathing at home and has been feeling mild shortness of breath.   Patient was on room air during session and remained SpO2>95 throughout.  On standing patient's BP dropped significantly, though he stated he felt "fine" and had no dizziness (see BP below), remained alert.  Reclined patient immediately and informed nursing.  Continued eval in seated position.  Patient completed sit to stand with supervision and appears to have good balance and strength.  Patient is impulsive and likes to be independent, needed education on effects of low BP and that it is unsafe right now for him to get up independently. Will further evaluate standing functional ability when BP allows.  Will continue to follow with OT acutely.  Blood Pressure: Sitting- 103/58 Stand 0 min- 62/44 Reclined after 2 min- 88/67 Reclined after 8 min- 109/69    Follow Up Recommendations  No OT follow up    Equipment Recommendations  3 in 1 bedside commode    Recommendations for Other Services       Precautions / Restrictions  Precautions Precautions: Fall;Other (comment)(Syncope episodes) Precaution Comments: Watch BP- orthostatic, impulsive Restrictions Weight Bearing Restrictions: No      Mobility Bed Mobility Overal bed mobility: Modified Independent                Transfers Overall transfer level: Needs assistance Equipment used: None Transfers: Sit to/from Stand Sit to Stand: Supervision              Balance Overall balance assessment: No apparent balance deficits (not formally assessed)                                         ADL either performed or assessed with clinical judgement   ADL Overall ADL's : Needs assistance/impaired Eating/Feeding: Independent;Sitting   Grooming: Supervision/safety;Standing   Upper Body Bathing: Minimal assistance;Standing   Lower Body Bathing: Moderate assistance;Sit to/from stand   Upper Body Dressing : Supervision/safety;Sitting   Lower Body Dressing: Minimal assistance;Sit to/from stand   Toilet Transfer: Supervision/safety;Ambulation;Regular Toilet           Functional mobility during ADLs: Supervision/safety General ADL Comments: Based on clinical judgement as patient is strong, moves well and has good balance, but BP too low to observe standing ADLs.     Vision         Perception     Praxis      Pertinent Vitals/Pain Pain Assessment: No/denies pain  Hand Dominance     Extremity/Trunk Assessment Upper Extremity Assessment Upper Extremity Assessment: Generalized weakness           Communication Communication Communication: No difficulties   Cognition Arousal/Alertness: Awake/alert Behavior During Therapy: WFL for tasks assessed/performed;Impulsive Overall Cognitive Status: Within Functional Limits for tasks assessed                                 General Comments: Is impulsive and wants to do things himself   General Comments       Exercises Exercises: Other  exercises Other Exercises Other Exercises: x10 flutter valve   Shoulder Instructions      Home Living Family/patient expects to be discharged to:: Private residence Living Arrangements: Spouse/significant other Available Help at Discharge: Family Type of Home: House Home Access: Stairs to enter Technical brewer of Steps: 2 Entrance Stairs-Rails: None Home Layout: One level     Bathroom Shower/Tub: Tub/shower unit;Walk-in shower   Bathroom Toilet: Standard Bathroom Accessibility: Yes   Home Equipment: None          Prior Functioning/Environment Level of Independence: Independent        Comments: Wife was helping with bathing during time between current and last hospital stay        OT Problem List: Decreased activity tolerance;Decreased safety awareness;Decreased knowledge of precautions;Cardiopulmonary status limiting activity      OT Treatment/Interventions: Self-care/ADL training;Therapeutic exercise;Energy conservation;Therapeutic activities    OT Goals(Current goals can be found in the care plan section) Acute Rehab OT Goals Patient Stated Goal: To go home OT Goal Formulation: With patient Time For Goal Achievement: 08/14/19 Potential to Achieve Goals: Good  OT Frequency: Min 2X/week   Barriers to D/C:            Co-evaluation              AM-PAC OT "6 Clicks" Daily Activity     Outcome Measure Help from another person eating meals?: None Help from another person taking care of personal grooming?: A Little Help from another person toileting, which includes using toliet, bedpan, or urinal?: A Little Help from another person bathing (including washing, rinsing, drying)?: A Lot Help from another person to put on and taking off regular upper body clothing?: A Little Help from another person to put on and taking off regular lower body clothing?: A Little 6 Click Score: 18   End of Session Nurse Communication: Mobility  status;Precautions;Other (comment)(BP)  Activity Tolerance: Treatment limited secondary to medical complications (Comment) Patient left: in chair;with call bell/phone within reach  OT Visit Diagnosis: History of falling (Z91.81);Other (comment)(Cardiopulminary limitations)                Time: [id number] OT Time Calculation (min): 31 min Charges:  OT General Charges $OT Visit: 1 Visit OT Evaluation $OT Eval Moderate Complexity: 1 Mod OT Treatments $Self Care/Home Management : 8-22 mins  August Luz, OTR/L   Phylliss Bob 07/31/2019, 3:13 PM

## 2019-07-31 NOTE — ED Notes (Signed)
bloodwork drawn, labeled with 2 pt identifiers, and sent to lab Pt remains resting on cart in NAD. VSS. Equal rise and fall of chest noted

## 2019-07-31 NOTE — ED Notes (Signed)
Pt resting on cart in NAD. Breathing easy, non-labored. Equal rise and fall of chest noted. Pt speaking in full sentences. VSS on monitors. Call light within reach. Awaiting carelink

## 2019-07-31 NOTE — ED Notes (Signed)
Care endorsed to [place], [place]

## 2019-08-01 ENCOUNTER — Inpatient Hospital Stay (HOSPITAL_COMMUNITY): Payer: Medicare HMO

## 2019-08-01 ENCOUNTER — Other Ambulatory Visit: Payer: Self-pay

## 2019-08-01 ENCOUNTER — Telehealth: Payer: Self-pay

## 2019-08-01 DIAGNOSIS — M7989 Other specified soft tissue disorders: Secondary | ICD-10-CM

## 2019-08-01 DIAGNOSIS — R55 Syncope and collapse: Secondary | ICD-10-CM

## 2019-08-01 DIAGNOSIS — R7989 Other specified abnormal findings of blood chemistry: Secondary | ICD-10-CM

## 2019-08-01 DIAGNOSIS — I951 Orthostatic hypotension: Secondary | ICD-10-CM

## 2019-08-01 LAB — BASIC METABOLIC PANEL
Anion gap: 10 (ref 5–15)
BUN: 22 mg/dL — ABNORMAL HIGH (ref 6–20)
CO2: 23 mmol/L (ref 22–32)
Calcium: 8.5 mg/dL — ABNORMAL LOW (ref 8.9–10.3)
Chloride: 103 mmol/L (ref 98–111)
Creatinine, Ser: 1.21 mg/dL (ref 0.61–1.24)
GFR calc Af Amer: >60 mL/min (ref >60)
GFR calc non Af Amer: >60 mL/min (ref >60)
Glucose, Bld: 227 mg/dL — ABNORMAL HIGH (ref 70–99)
Potassium: 3.4 mmol/L — ABNORMAL LOW (ref 3.5–5.1)
Sodium: 136 mmol/L (ref 135–145)

## 2019-08-01 LAB — CBC
HCT: 33.4 % — ABNORMAL LOW (ref 39.0–52.0)
Hemoglobin: 11.1 g/dL — ABNORMAL LOW (ref 13.0–17.0)
MCH: 28.4 pg (ref 26.0–34.0)
MCHC: 33.2 g/dL (ref 30.0–36.0)
MCV: 85.4 fL (ref 80.0–100.0)
Platelets: 128 10*3/uL — ABNORMAL LOW (ref 150–400)
RBC: 3.91 MIL/uL — ABNORMAL LOW (ref 4.22–5.81)
RDW: 13.9 % (ref 11.5–15.5)
WBC: 5.2 10*3/uL (ref 4.0–10.5)
nRBC: 0 % (ref 0.0–0.2)

## 2019-08-01 LAB — GLUCOSE, CAPILLARY
Glucose-Capillary: 222 mg/dL — ABNORMAL HIGH (ref 70–99)
Glucose-Capillary: 273 mg/dL — ABNORMAL HIGH (ref 70–99)

## 2019-08-01 MED ORDER — OXYCODONE HCL 5 MG PO TABS
5.0000 mg | ORAL_TABLET | Freq: Four times a day (QID) | ORAL | Status: DC | PRN
  Administered 2019-08-01: 5 mg via ORAL
  Filled 2019-08-01: qty 1

## 2019-08-01 MED ORDER — POTASSIUM CHLORIDE CRYS ER 20 MEQ PO TBCR
40.0000 meq | EXTENDED_RELEASE_TABLET | Freq: Once | ORAL | Status: AC
  Administered 2019-08-01: 40 meq via ORAL
  Filled 2019-08-01: qty 2

## 2019-08-01 MED ORDER — INSULIN GLARGINE 100 UNIT/ML ~~LOC~~ SOLN
15.0000 [IU] | Freq: Every day | SUBCUTANEOUS | Status: DC
  Administered 2019-08-01: 15 [IU] via SUBCUTANEOUS
  Filled 2019-08-01 (×2): qty 0.15

## 2019-08-01 NOTE — Progress Notes (Signed)
Waiting for TED hose ordered from supply management. Patient educated on use for TED hose and refusing at this time. Patient ambulatory to bathroom and chair multiple times during the day.

## 2019-08-01 NOTE — Telephone Encounter (Signed)
Wife called to let Dr Darnell Level know he was back in the hospital.

## 2019-08-01 NOTE — Progress Notes (Signed)
   08/01/19 1127  Family/Significant Other Communication  Family/Significant Other Update Called;Updated (Wife Suanne Marker)  Updated wife Suanne Marker with patient status and plan of care (ph: [phone]). Message sent to doctor that wife would like a call to discuss patient.

## 2019-08-01 NOTE — Progress Notes (Signed)
PHYSICAL THERAPY EVALUATION  HISTORY OF ILLNESS:  54 y.o. male with medical history significant of cirrhosis s/p liver transplant in 2017 now just on tacrolimus, DM2, HTN, CKD stage 3. Patient was diagnosed with COVID on 1/18. Patient was admitted for hypoxic resp failure due to COVID to [facility] from 1/21 to 1/26.  Discharged home on 3L O2 due to persistent hypoxia.  He was discharged on oral steroids which she finished on 07/29/2019 thereafter he became lightheaded at home and presented to the ER hypotensive and orthostatic.  Was admitted again.  CLINICAL IMPRESSION: Pt admitted with above hx and above dx. He is quite independent with mobility but very impulsive with very poor insight into safety and also his own deficits. He does become orthostatic with change in position but he denies any accompanying sx. During this assessment to increased time with each transition to allow acclimatization to position prior to proceeding. Pt was able to complete all room activities and mod I/I level, he ambulated in room approx 41ft with SBA (line management and VS monitoring) BP 133/82 at end of ambulation sitting in recliner. After seated rest pt was able to ambulate approx 223ft with SBA. Pt will benefit from continued PT tx while in hospital to address activity tolerance, safety and independence with mobility. At dc pt should not require any further PT follow up.    08/01/19 1056  PT Visit Information  Last PT Received On 08/01/19  Assistance Needed +1  History of Present Illness Colton Porter is a 54 y.o. male with medical history significant of cirrhosis s/p liver transplant in 2017 now just on tacrolimus, DM2, HTN, CKD stage 3. Patient was diagnosed with COVID on 1/18. Patient was admitted for hypoxic resp failure due to COVID to [facility] from 1/21 to 1/26.  Discharged home on 3L O2 due to persistent hypoxia.  He was discharged on oral steroids which she finished on 07/29/2019 thereafter he became lightheaded at home and  presented to the ER hypotensive and orthostatic.  Was admitted again.  Precautions  Precautions Fall;Other (comment)  Precaution Comments Watch BP- orthostatic, impulsive  Restrictions  Weight Bearing Restrictions No  Home Living  Family/patient expects to be discharged to: Private residence  Living Arrangements Spouse/significant other  Available Help at Discharge Family  Type of [place] to enter  Entrance Stairs-Number of Steps 2  Entrance Stairs-Rails None  Home Layout One level  Bathroom Shower/Tub Tub/shower unit;Walk-in Cytogeneticist Yes  Home Equipment None  Prior Function  Level of Independence Independent  Comments prior to all illness was very independent and working in [place] but since hospitalizations has been needing some assistance from spouse  Communication  Communication No difficulties  Pain Assessment  Pain Assessment No/denies pain  Cognition  Arousal/Alertness Awake/alert  Behavior During Therapy WFL for tasks assessed/performed;Impulsive  Overall Cognitive Status Within Functional Limits for tasks assessed  Upper Extremity Assessment  Upper Extremity Assessment Overall WFL for tasks assessed  Lower Extremity Assessment  Lower Extremity Assessment Overall WFL for tasks assessed  Cervical / Trunk Assessment  Cervical / Trunk Assessment Normal  Bed Mobility  Overal bed mobility Independent  Transfers  Overall transfer level Independent  Ambulation/Gait  Ambulation/Gait assistance Supervision  Gait Distance (Feet) 250 Feet  Assistive device None  Gait Pattern/deviations WFL(Within Functional Limits)  General Gait Details ambulated in room approx 35ft with SBA and no AD, then 256ft with no AD and SBA in hall  Gait velocity fair  Balance  Overall balance assessment No apparent balance deficits (not formally assessed)  PT - End of Session  Activity Tolerance Patient tolerated  treatment well  Patient left in chair;with call bell/phone within reach  Nurse Communication Mobility status  PT Assessment  PT Recommendation/Assessment Patient needs continued PT services  PT Visit Diagnosis Other abnormalities of gait and mobility (R26.89)  PT Problem List Decreased activity tolerance;Decreased safety awareness  Barriers to Discharge Other (comment)  Barriers to Discharge Comments multi failed dcs home  PT Plan  PT Frequency (ACUTE ONLY) Min 3X/week  PT Treatment/Interventions (ACUTE ONLY) Gait training;Stair training;Functional mobility training;Therapeutic activities;Therapeutic exercise;Balance training  AM-PAC PT "6 Clicks" Mobility Outcome Measure (Version 2)  Help needed turning from your back to your side while in a flat bed without using bedrails? 4  Help needed moving from lying on your back to sitting on the side of a flat bed without using bedrails? 4  Help needed moving to and from a bed to a chair (including a wheelchair)? 4  Help needed standing up from a chair using your arms (e.g., wheelchair or bedside chair)? 4  Help needed to walk in hospital room? 3  Help needed climbing 3-5 steps with a railing?  3  6 Click Score 22  Consider Recommendation of Discharge To: Home with no services  PT Recommendation  Follow Up Recommendations No PT follow up  PT equipment None recommended by PT  Individuals Consulted  Consulted and Agree with Results and Recommendations Patient  Acute Rehab PT Goals  Patient Stated Goal to find out why he keeps coming back to hospital  PT Goal Formulation With patient  Time For Goal Achievement 08/15/19  Potential to Achieve Goals Good  PT Time Calculation  PT Start Time (ACUTE ONLY) 1037  PT Stop Time (ACUTE ONLY) 1056  PT Time Calculation (min) (ACUTE ONLY) 19 min  PT General Charges  $$ ACUTE PT VISIT 1 Visit  PT Evaluation  $PT Eval Low Complexity 1 Low    Horald Chestnut, PT

## 2019-08-01 NOTE — Telephone Encounter (Signed)
Noted. Records reviewed. Admitted for syncope thought orthostatic and ARF. Hopeful to be coming home soon.

## 2019-08-01 NOTE — TOC Initial Note (Signed)
Transition of Care [facility]) - Initial/Assessment Note    Patient Details  Name: Colton Porter MRN: [id number] Date of Birth: Aug 20, 1965  [facility]) CM/SW Contact:    Shade Flood, LCSW Phone Number: 08/01/2019, 10:46 AM  Clinical Narrative:                  Pt admitted from home. Pt was discharged from [facility] with Home O2 last week. TOC arranged pt's home O2 with [employer]. Pt lives with his wife at home and it is anticipated that pt will return home again at dc.  TOC will follow and assist if needs arise.  Expected Discharge Plan: Home/Self Care Barriers to Discharge: Continued Medical Work up   Patient Goals and CMS Choice        Expected Discharge Plan and Services Expected Discharge Plan: Home/Self Care In-house Referral: Clinical Social Work     Living arrangements for the past 2 months: Single Family Home                                      Prior Living Arrangements/Services Living arrangements for the past 2 months: Single Family Home Lives with:: Spouse Patient language and need for interpreter reviewed:: Yes Do you feel safe going back to the place where you live?: Yes      Need for Family Participation in Patient Care: Yes (Comment) Care giver support system in place?: Yes (comment) Current home services: DME Criminal Activity/Legal Involvement Pertinent to Current Situation/Hospitalization: No - Comment as needed  Activities of Daily Living Home Assistive Devices/Equipment: CBG Meter, CPAP ADL Screening (condition at time of admission) Patient's cognitive ability adequate to safely complete daily activities?: Yes Is the patient deaf or have difficulty hearing?: No Does the patient have difficulty seeing, [place] when wearing glasses/contacts?: No Does the patient have difficulty concentrating, remembering, or making decisions?: No Patient able to express need for assistance with ADLs?: Yes Does the patient have difficulty  dressing or bathing?: Yes Independently performs ADLs?: Yes (appropriate for developmental age) Communication: Independent Feeding: Independent Does the patient have difficulty walking or climbing stairs?: Yes Weakness of Legs: Both Weakness of Arms/Hands: None  Permission Sought/Granted                  Emotional Assessment       Orientation: : Oriented to Self, Oriented to Place, Oriented to  Time, Oriented to Situation Alcohol / Substance Use: Not Applicable Psych Involvement: No (comment)  Admission diagnosis:  Syncope [R55] AKI (acute kidney injury) ([place]) [N17.9] Syncope, unspecified syncope type [R55] Hypotension due to hypovolemia [I95.89, E86.1] Orthostatic hypotension [I95.1] Patient Active Problem List   Diagnosis Date Noted  . Orthostatic hypotension 07/31/2019  . Syncope 07/30/2019  . Protein-calorie malnutrition ([place]) 07/27/2019  . Left rib fracture 07/27/2019  . Acute respiratory failure with hypoxia ([place]) 07/18/2019  . Pneumonia due to COVID-19 virus 07/18/2019  . [place] lesion, left 02/05/2018  . Chronic heel pain, left 10/14/2017  . Anejaculation 08/09/2017  . Chronic gout 05/22/2017  . Foot swelling 04/12/2017  . Disorder of ejaculation 04/06/2017  . Dyspnea 11/18/2016  . BPH (benign prostatic hyperplasia) 10/05/2016  . Status post liver transplant ([place]) 03/15/2016  . Portal hypertensive gastropathy ([place]) 12/04/2015  . Type 2 diabetes mellitus with other specified complication ([place]) [id number]  . Gynecomastia   . CKD (chronic kidney disease) stage 3, GFR 30-59 ml/min (  [place]) 11/04/2015  . Depression, major, single episode, moderate ([place]) 07/09/2015  . Headache 02/19/2015  . Insomnia 12/06/2014  . Hypomagnesemia   . AKI (acute kidney injury) ([place])   . Hypotension 11/21/2014  . Other fatigue 11/18/2014  . Internal hemorrhoid 10/01/2014  . Erectile dysfunction 10/01/2014  . Alcoholic cirrhosis of liver without ascites ([place]) 07/28/2014  .  (HFpEF) heart failure with preserved ejection fraction ([place]) 11/01/2013  . Essential hypertension 06/04/2013  . OSA on CPAP 07/11/2012  . History of alcohol dependence ([place]) 07/11/2012  . Thrombocytopenia ([place]) 12/15/2011  . Morbid obesity with BMI of 50.0-59.9, adult ([place]) 11/16/2011  . Routine general medical examination at a health care facility 09/22/2010  . NEUROPATHY 05/27/2009  . Pedal edema 09/22/2008  . Dyslipidemia associated with type 2 diabetes mellitus ([place]) 02/15/2007  . Allergic rhinitis 02/15/2007  . GERD 02/15/2007   PCP:  Ria Bush, MD Pharmacy:   [id number] - [place], [place] - [address] [place] [address] Phone: [phone] Fax: [phone]  [facility] - [place], [place] [address] [place] [address] Phone: [phone] Fax: [phone]     Social Determinants of Health (SDOH) Interventions    Readmission Risk Interventions Readmission Risk Prevention Plan 08/01/2019 07/23/2019  Transportation Screening Complete Complete  PCP or Specialist Appt within 3-5 Days - Complete  HRI or [place] - Complete  Social Work Consult for [place] Planning/Counseling - Complete  Palliative Care Screening - Not Applicable  Medication Review Press photographer) Complete Complete  Palliative Care Screening Not Applicable -  [place] Not Applicable -  Some recent data might be hidden

## 2019-08-01 NOTE — Progress Notes (Signed)
PROGRESS NOTE                                                                                                                                                                                                             Patient Demographics:    Colton Porter, is a 54 y.o. male, DOB - February 04, 1966, [id number]  Outpatient Primary MD for the patient is Ria Bush, MD    LOS - 1  Admit date - 07/30/2019    Chief Complaint  Patient presents with  . Loss of Consciousness       Brief Narrative  Colton Porter is a 54 y.o. male with medical history significant of cirrhosis s/p liver transplant in 2017 now just on tacrolimus, DM2, HTN, CKD stage 3. Patient was diagnosed with COVID on 1/18. Patient was admitted for hypoxic resp failure due to COVID to [facility] from 1/21 to 1/26.  Discharged home on 3L O2 due to persistent hypoxia.  He was discharged on oral steroids which she finished on 07/29/2019 thereafter he became lightheaded at home and presented to the ER hypotensive and orthostatic.  Was admitted again.   Subjective:   Patient states that he is feeling slightly better.  Did not feel lightheaded this morning when they stood him up.  Denies any nausea vomiting.  Urinating well.  Reasonably good appetite.   Assessment  & Plan :     Pneumonia due to COVID-19/acute respiratory failure with hypoxia Patient recently hospitalized for same.  He was discharged on home oxygen at 3 L/min.  Currently able to be off of oxygen at rest.  Saturating in the early 90s.  Continue with incentive spirometry.  Mobilize as much as possible.   Orthostatic hypotension/syncope Syncope most likely due to orthostatic hypotension due to hypovolemia.  Patient started on IV fluids.  Continue for another 24 hours.  Echocardiogram done during this hospitalization showed normal systolic function.  RV function was normal as well.  Grade 1 diastolic dysfunction noted.  Continue to monitor orthostatics twice a day.   Seems to have improved.  Mobilize and ambulate today.  Acute renal failure Presented with a creatinine of 1.97.  Baseline is around 1.3.  Hydrated.  Renal function is now back to baseline.  Replace potassium.  Elevated D-dimer Most likely due to recent COVID-19.  We will check lower extremity Dopplers.  Status post renal transplant Stable.  Continue tacrolimus.  Diabetes mellitus type 2, uncontrolled with hyperglycemia Continue with Lantus and sliding  scale coverage.  CBGs noted to be elevated.  HbA1c was 10.1 when recently checked.  Adjust insulin dose.   Consults  :  None  Procedures  :    Transthoracic echocardiogram 07/31/2019 1. Left ventricular ejection fraction, by visual estimation, is 65 to  70%. The left ventricle has hyperdynamic function. There is moderately  increased left ventricular hypertrophy.  2. Left ventricular diastolic parameters are consistent with Grade I  diastolic dysfunction (impaired relaxation).  3. The left ventricle has no regional wall motion abnormalities.  4. Global right ventricle has normal systolic function.The right  ventricular size is normal. Mildly increased right ventricular wall  thickness.  5. Left atrial size was normal.  6. Right atrial size was normal.  7. The mitral valve is grossly normal. No evidence of mitral valve  regurgitation.  8. The tricuspid valve is grossly normal.  9. The tricuspid valve is grossly normal. Tricuspid valve regurgitation  is trivial.  10. The aortic valve is grossly normal. Aortic valve regurgitation is not  visualized.  11. The pulmonic valve was grossly normal. Pulmonic valve regurgitation is  not visualized.  12. The interatrial septum was not well visualized.  13. Very poor acoustic windows due to patient's body habitus. LV and RV  function are grossly normal. No obvious valvular abnormalities noted.  PUD Prophylaxis : PPI  DVT Prophylaxis  :  Lovenox    Inpatient  Medications  Scheduled Meds: . aspirin EC  81 mg Oral Daily  . enoxaparin (LOVENOX) injection  60 mg Subcutaneous Daily  . escitalopram  10 mg Oral QHS  . fluticasone  2 spray Each Nare Daily  . insulin aspart  0-15 Units Subcutaneous TID WC  . insulin aspart  0-5 Units Subcutaneous QHS  . insulin glargine  10 Units Subcutaneous QHS  . pantoprazole  80 mg Oral Daily  . pravastatin  20 mg Oral q1800  . sodium chloride flush  3 mL Intravenous Q12H  . tacrolimus  1 mg Oral QHS  . tacrolimus  2 mg Oral q morning - 10a   Continuous Infusions: . lactated ringers 75 mL/hr at 07/31/19 1416   PRN Meds:.acetaminophen **OR** acetaminophen, albuterol, ondansetron **OR** ondansetron (ZOFRAN) IV, oxyCODONE  Antibiotics  :    Anti-infectives (From admission, onward)   None       Bonnielee Haff M.D on 08/01/2019 at 9:54 AM  To page go to [email or web address]  [facility] -  Office  [phone]       Objective:   Vitals:   08/01/19 0400 08/01/19 0500 08/01/19 0722 08/01/19 0848  BP: 137/79  113/69   Pulse:   78   Resp:      Temp: 98.3 F (36.8 C)  98.4 F (36.9 C)   TempSrc: Oral  Oral   SpO2: 93%     Weight:  124.6 kg    Height:    5\' 6"  (1.676 m)    Wt Readings from Last 3 Encounters:  08/01/19 124.6 kg  07/18/19 127 kg  07/15/19 127 kg     Intake/Output Summary (Last 24 hours) at 08/01/2019 0954 Last data filed at 07/31/2019 2100 Gross per 24 hour  Intake 434.46 ml  Output 500 ml  Net -65.54 ml   Orthostatic VS for the past 24 hrs:  BP- Lying Pulse- Lying BP- Sitting Pulse- Sitting BP- Standing at 0 minutes Pulse- Standing at 0 minutes  08/01/19 0800 113/69 78 134/86 88 92/55 108  08/01/19 0722 113/69 78 134/86 88 92/55  108  07/31/19 2000 118/71 80 128/77 89 92/62 115  07/31/19 1500 -- -- 103/58 -- (!) 62/44 --      Physical Exam  General appearance: Awake alert.  In no distress Resp: Clear to auscultation bilaterally.  Normal effort Cardio: S1-S2 is  normal regular.  No S3-S4.  No rubs murmurs or bruit GI: Abdomen is soft.  Nontender nondistended.  Bowel sounds are present normal.  No masses organomegaly Extremities: No edema.  Full range of motion of lower extremities. Neurologic: Alert and oriented x3.  No focal neurological deficits.      Data Review:    CBC Recent Labs  Lab 07/30/19 2018 07/31/19 0249 08/01/19 0344  WBC 8.5 8.1 5.2  HGB 12.9* 12.1* 11.1*  HCT 38.2* 36.3* 33.4*  PLT 170 161 128*  MCV 84.7 84.0 85.4  MCH 28.6 28.0 28.4  MCHC 33.8 33.3 33.2  RDW 13.5 13.5 13.9    Chemistries  Recent Labs  Lab 07/30/19 2018 07/31/19 0130 07/31/19 0249 08/01/19 0344  NA 131*  --  134* 136  K 3.8  --  3.7 3.4*  CL 98  --  103 103  CO2 21*  --  22 23  GLUCOSE 322*  --  277* 227*  BUN 37*  --  32* 22*  CREATININE 1.97*  --  1.60* 1.21  CALCIUM 8.6*  --  8.1* 8.5*  AST  --  14*  --   --   ALT  --  22  --   --   ALKPHOS  --  70  --   --   BILITOT  --  0.9  --   --     Micro Results Recent Results (from the past 240 hour(s))  Blood culture (routine x 2)     Status: None (Preliminary result)   Collection Time: 07/30/19 10:38 PM   Specimen: BLOOD  Result Value Ref Range Status   Specimen Description BLOOD RIGHT ANTECUBITAL  Final   Special Requests   Final    BOTTLES DRAWN AEROBIC AND ANAEROBIC Blood Culture adequate volume   Culture   Final    NO GROWTH < 12 HOURS Performed at [facility], [address], [place], [place] [address]    Report Status PENDING  Incomplete  Blood culture (routine x 2)     Status: None (Preliminary result)   Collection Time: 07/30/19 10:42 PM   Specimen: BLOOD RIGHT WRIST  Result Value Ref Range Status   Specimen Description BLOOD RIGHT WRIST  Final   Special Requests   Final    BOTTLES DRAWN AEROBIC AND ANAEROBIC Blood Culture results may not be optimal due to an inadequate volume of blood received in culture bottles   Culture   Final    NO GROWTH < 12  HOURS Performed at [facility], [place] [address] [place], [place] [address]    Report Status PENDING  Incomplete    Radiology Reports  DG Chest Portable 1 View  Result Date: 07/30/2019 CLINICAL DATA:  54 year old male with shortness of breath and chest pain. EXAM: PORTABLE CHEST 1 VIEW COMPARISON:  Chest radiograph dated 07/21/2019. FINDINGS: Overall interval improvement of bilateral airspace opacities, most consistent with pneumonia, likely viral or atypical in etiology. No pleural effusion or pneumothorax. Stable cardiomediastinal silhouette. No acute osseous pathology. IMPRESSION: Improved bilateral airspace infiltrates. Electronically Signed   By: Anner Crete M.D.   On: 07/30/2019 20:35   ECHOCARDIOGRAM COMPLETE  Result Date: 07/31/2019  ECHOCARDIOGRAM REPORT   Patient Name:   KANYON GRUN Date of Exam: 07/31/2019 Medical Rec #:  [id number]        Height:       66.0 in Accession #:    [id number]       Weight:       280.0 lb Date of Birth:  09-10-1965        BSA:          2.31 m Patient Age:    63 years         BP:           113/69 mmHg Patient Gender: M                HR:           95 bpm. Exam Location:  Inpatient Procedure: 2D Echo Indications:    780.2 syncope  History:        Patient has prior history of Echocardiogram examinations, most                 recent 06/09/2016. CHF; Risk Factors:Hypertension, Diabetes and                 Dyslipidemia. ETOH Use. covid +.  Sonographer:    Jannett Celestine RDCS (AE) Referring Phys: Gary  1. Left ventricular ejection fraction, by visual estimation, is 65 to 70%. The left ventricle has hyperdynamic function. There is moderately increased left ventricular hypertrophy.  2. Left ventricular diastolic parameters are consistent with Grade I diastolic dysfunction (impaired relaxation).  3. The left ventricle has no regional wall motion abnormalities.  4. Global right ventricle has normal systolic function.The right  ventricular size is normal. Mildly increased right ventricular wall thickness.  5. Left atrial size was normal.  6. Right atrial size was normal.  7. The mitral valve is grossly normal. No evidence of mitral valve regurgitation.  8. The tricuspid valve is grossly normal.  9. The tricuspid valve is grossly normal. Tricuspid valve regurgitation is trivial. 10. The aortic valve is grossly normal. Aortic valve regurgitation is not visualized. 11. The pulmonic valve was grossly normal. Pulmonic valve regurgitation is not visualized. 12. The interatrial septum was not well visualized. 13. Very poor acoustic windows due to patient's body habitus. LV and RV function are grossly normal. No obvious valvular abnormalities noted. FINDINGS  Left Ventricle: Left ventricular ejection fraction, by visual estimation, is 65 to 70%. The left ventricle has hyperdynamic function. The left ventricle has no regional wall motion abnormalities. There is moderately increased left ventricular hypertrophy. Left ventricular diastolic parameters are consistent with Grade I diastolic dysfunction (impaired relaxation). Right Ventricle: The right ventricular size is normal. Mildly increased right ventricular wall thickness. Global RV systolic function is has normal systolic function. Left Atrium: Left atrial size was normal in size. Right Atrium: Right atrial size was normal in size Pericardium: There is no evidence of pericardial effusion. Mitral Valve: The mitral valve is grossly normal. No evidence of mitral valve regurgitation. Tricuspid Valve: The tricuspid valve is grossly normal. Tricuspid valve regurgitation is trivial. Aortic Valve: The aortic valve is grossly normal. Aortic valve regurgitation is not visualized. Pulmonic Valve: The pulmonic valve was grossly normal. Pulmonic valve regurgitation is not visualized. Pulmonic regurgitation is not visualized. Aorta: The aortic root and ascending aorta are structurally normal, with no evidence  of dilitation. Venous: The inferior vena cava was not well visualized. IAS/Shunts: The interatrial septum was not well visualized.  LEFT VENTRICLE PLAX 2D LVIDd:         3.63 cm LVIDs:         2.39 cm LV PW:         1.33 cm LV IVS:        1.45 cm LVOT diam:     2.10 cm LV SV:         36 ml LV SV Index:   14.22 LVOT Area:     3.46 cm  RIGHT VENTRICLE RV S prime:     23.00 cm/s TAPSE (M-mode): 1.7 cm LEFT ATRIUM           Index       RIGHT ATRIUM           Index LA diam:      3.50 cm 1.52 cm/m  RA Area:     16.30 cm LA Vol (A4C): 54.3 ml 23.53 ml/m RA Volume:   42.10 ml  18.25 ml/m  AORTIC VALVE LVOT Vmax:   94.60 cm/s LVOT Vmean:  66.100 cm/s LVOT VTI:    0.148 m  AORTA Ao Root diam: 3.10 cm MITRAL VALVE MV Area (PHT): 2.69 cm             SHUNTS MV PHT:        81.78 msec           Systemic VTI:  0.15 m MV Decel Time: 282 msec             Systemic Diam: 2.10 cm MV E velocity: 62.40 cm/s 103 cm/s MV A velocity: 72.20 cm/s 70.3 cm/s MV E/A ratio:  0.86       1.5  Glori Bickers MD Electronically signed by Glori Bickers MD Signature Date/Time: 07/31/2019/7:53:11 PM    Final    VAS [name] LOWER EXTREMITY VENOUS (DVT)  Result Date: 08/01/2019  Lower Venous DVTStudy Indications: Covid-19.  Comparison Study: Prior study form 11/03/15 is available for comparison Performing Technologist: Sharion Dove RVS  Examination Guidelines: A complete evaluation includes B-mode imaging, spectral Doppler, color Doppler, and power Doppler as needed of all accessible portions of each vessel. Bilateral testing is considered an integral part of a complete examination. Limited examinations for reoccurring indications may be performed as noted. The reflux portion of the exam is performed with the patient in reverse Trendelenburg.  +---------+---------------+---------+-----------+----------+--------------+ RIGHT    CompressibilityPhasicitySpontaneityPropertiesThrombus Aging  +---------+---------------+---------+-----------+----------+--------------+ CFV      Full           Yes      Yes                                 +---------+---------------+---------+-----------+----------+--------------+ SFJ      Full                                                        +---------+---------------+---------+-----------+----------+--------------+ FV Prox  Full                                                        +---------+---------------+---------+-----------+----------+--------------+ FV Mid   Full                                                        +---------+---------------+---------+-----------+----------+--------------+  FV DistalFull                                                        +---------+---------------+---------+-----------+----------+--------------+ PFV      Full                                                        +---------+---------------+---------+-----------+----------+--------------+ POP      Full           Yes      Yes                                 +---------+---------------+---------+-----------+----------+--------------+ PTV      Full                                                        +---------+---------------+---------+-----------+----------+--------------+ PERO     Full                                                        +---------+---------------+---------+-----------+----------+--------------+   +---------+---------------+---------+-----------+----------+--------------+ LEFT     CompressibilityPhasicitySpontaneityPropertiesThrombus Aging +---------+---------------+---------+-----------+----------+--------------+ CFV      Full           Yes      Yes                                 +---------+---------------+---------+-----------+----------+--------------+ SFJ      Full                                                         +---------+---------------+---------+-----------+----------+--------------+ FV Prox  Full                                                        +---------+---------------+---------+-----------+----------+--------------+ FV Mid   Full                                                        +---------+---------------+---------+-----------+----------+--------------+ FV DistalFull                                                        +---------+---------------+---------+-----------+----------+--------------+  PFV      Full                                                        +---------+---------------+---------+-----------+----------+--------------+ POP      Full           Yes      Yes                                 +---------+---------------+---------+-----------+----------+--------------+ PTV      Full                                                        +---------+---------------+---------+-----------+----------+--------------+ PERO     Full                                                        +---------+---------------+---------+-----------+----------+--------------+     Summary: BILATERAL: - No evidence of deep vein thrombosis seen in the lower extremities, bilaterally.  RIGHT: - Findings appear essentially unchanged compared to previous examination.  LEFT: - Findings appear essentially unchanged compared to previous examination.  *See table(s) above for measurements and observations.    Preliminary

## 2019-08-01 NOTE — Progress Notes (Addendum)
Occupational Therapy Treatment Patient Details Name: Colton Porter MRN: [id number] DOB: Jan 23, 1966 Today's Date: 08/01/2019    History of present illness Colton Porter is a 54 y.o. male with medical history significant of cirrhosis s/p liver transplant in 2017 now just on tacrolimus, DM2, HTN, CKD stage 3. Patient was diagnosed with COVID on 1/18. Patient was admitted for hypoxic resp failure due to COVID to [facility] from 1/21 to 1/26.  Discharged home on 3L O2 due to persistent hypoxia.  He was discharged on oral steroids which she finished on 07/29/2019 thereafter he became lightheaded at home and presented to the ER hypotensive and orthostatic.  Was admitted again.   OT comments  Patient in bed on arrival and willing to participate.  Educated on importance of taking things slowly with positional changes and resting briefly at each stage.  Patient on room air throughout session and did experience any desat with mobility.  Functional mobility completed independently, but with supervision due to low BP (see below) and history of syncope.  Patient continues to try to move quickly and needs reinforcement for safety.  Completed grooming standing at sink.  Will continue to follow with OT acutely.  Blood Pressure: Laying- 144/75 Sitting EOB- 113/66 Standing 0 min- 88/62 (nonsymptomatic)  Standing 3 min- 102/65 Sitting 5 min- 102/65   Follow Up Recommendations  No OT follow up    Equipment Recommendations  3 in 1 bedside commode    Recommendations for Other Services      Precautions / Restrictions Precautions Precautions: Fall;Other (comment) Precaution Comments: Watch BP- orthostatic, impulsive Restrictions Weight Bearing Restrictions: No       Mobility Bed Mobility Overal bed mobility: Independent                Transfers Overall transfer level: Independent                    Balance Overall balance assessment: No apparent balance deficits (not formally assessed)                                         ADL either performed or assessed with clinical judgement   ADL Overall ADL's : Needs assistance/impaired Eating/Feeding: Independent;Sitting   Grooming: Wash/dry hands;Wash/dry face;Oral care;Standing;Supervision/safety                               Functional mobility during ADLs: Supervision/safety(Supervision only due to low BP)       Vision       Perception     Praxis      Cognition Arousal/Alertness: Awake/alert Behavior During Therapy: WFL for tasks assessed/performed;Impulsive Overall Cognitive Status: Within Functional Limits for tasks assessed                                 General Comments: Is impulsive and wants to do things himself        Exercises Other Exercises Other Exercises: x10 flutter valve   Shoulder Instructions       General Comments      Pertinent Vitals/ Pain       Pain Assessment: No/denies pain  Home Living  Prior Functioning/Environment              Frequency  Min 2X/week        Progress Toward Goals  OT Goals(current goals can now be found in the care plan section)  Progress towards OT goals: Progressing toward goals  Acute Rehab OT Goals Patient Stated Goal: To go home OT Goal Formulation: With patient Time For Goal Achievement: 08/14/19 Potential to Achieve Goals: Good  Plan Discharge plan remains appropriate    Co-evaluation                 AM-PAC OT "6 Clicks" Daily Activity     Outcome Measure   Help from another person eating meals?: None Help from another person taking care of personal grooming?: A Little Help from another person toileting, which includes using toliet, bedpan, or urinal?: A Little Help from another person bathing (including washing, rinsing, drying)?: A Little Help from another person to put on and taking off regular upper body  clothing?: A Little Help from another person to put on and taking off regular lower body clothing?: A Little 6 Click Score: 19    End of Session    OT Visit Diagnosis: History of falling (Z91.81);Other (comment)   Activity Tolerance Patient tolerated treatment well   Patient Left in bed;with call bell/phone within reach   Nurse Communication Mobility status;Precautions;Other (comment)        [id number] OT Time Calculation (min): 26 min  Charges: OT General Charges $OT Visit: 1 Visit OT Treatments $Self Care/Home Management : 23-37 mins   August Luz, OTR/L    Phylliss Bob 08/01/2019, 12:59 PM

## 2019-08-01 NOTE — Progress Notes (Signed)
VASCULAR LAB PRELIMINARY  PRELIMINARY  PRELIMINARY  PRELIMINARY  Bilateral lower extremity venous duplex completed.    Preliminary report:  See CV proc for preliminary results.   Abishai Viegas, RVT 08/01/2019, 9:21 AM

## 2019-08-02 ENCOUNTER — Telehealth: Payer: Self-pay

## 2019-08-02 LAB — BASIC METABOLIC PANEL WITH GFR
Anion gap: 7 (ref 5–15)
BUN: 16 mg/dL (ref 6–20)
CO2: 24 mmol/L (ref 22–32)
Calcium: 8.4 mg/dL — ABNORMAL LOW (ref 8.9–10.3)
Chloride: 107 mmol/L (ref 98–111)
Creatinine, Ser: 1.18 mg/dL (ref 0.61–1.24)
GFR calc Af Amer: >60 mL/min
GFR calc non Af Amer: >60 mL/min
Glucose, Bld: 184 mg/dL — ABNORMAL HIGH (ref 70–99)
Potassium: 3.8 mmol/L (ref 3.5–5.1)
Sodium: 138 mmol/L (ref 135–145)

## 2019-08-02 LAB — GLUCOSE, CAPILLARY
Glucose-Capillary: 164 mg/dL — ABNORMAL HIGH (ref 70–99)
Glucose-Capillary: 178 mg/dL — ABNORMAL HIGH (ref 70–99)
Glucose-Capillary: 193 mg/dL — ABNORMAL HIGH (ref 70–99)
Glucose-Capillary: 201 mg/dL — ABNORMAL HIGH (ref 70–99)

## 2019-08-02 LAB — CBC
HCT: 32.3 % — ABNORMAL LOW (ref 39.0–52.0)
Hemoglobin: 10.9 g/dL — ABNORMAL LOW (ref 13.0–17.0)
MCH: 29.1 pg (ref 26.0–34.0)
MCHC: 33.7 g/dL (ref 30.0–36.0)
MCV: 86.1 fL (ref 80.0–100.0)
Platelets: 129 K/uL — ABNORMAL LOW (ref 150–400)
RBC: 3.75 MIL/uL — ABNORMAL LOW (ref 4.22–5.81)
RDW: 14.1 % (ref 11.5–15.5)
WBC: 4.8 K/uL (ref 4.0–10.5)
nRBC: 0 % (ref 0.0–0.2)

## 2019-08-02 NOTE — Progress Notes (Signed)
Physical Therapy Treatment Patient Details Name: Colton Porter MRN: [id number] DOB: Jun 21, 1966 Today's Date: 08/02/2019    History of Present Illness Colton Porter is a 54 y.o. male with medical history significant of cirrhosis s/p liver transplant in 2017 now just on tacrolimus, DM2, HTN, CKD stage 3. Patient was diagnosed with COVID on 1/18. Patient was admitted for hypoxic resp failure due to COVID to [facility] from 1/21 to 1/26.  Discharged home on 3L O2 due to persistent hypoxia.  He was discharged on oral steroids which she finished on 07/29/2019 thereafter he became lightheaded at home and presented to the ER hypotensive and orthostatic.  Was admitted again.    PT Comments    Pt still progressing with mobility and independence, he is at mod I/I with function within room but still needs some supervision with extended ambulation to monitor sats. Pt was able to ambulate approx 240ft with stand by assist and no AD. He was on room air and sats did drop to min of 86% while ambulating. With seated rest break able to recover very quickly and sats back in 90s. Pt denied any associated sx.     Follow Up Recommendations  No PT follow up     Equipment Recommendations  None recommended by PT    Recommendations for Other Services       Precautions / Restrictions Precautions Precautions: Fall;Other (comment) Precaution Comments: Watch BP- orthostatic, impulsive Restrictions Weight Bearing Restrictions: No    Mobility  Bed Mobility Overal bed mobility: Independent                Transfers Overall transfer level: Independent                  Ambulation/Gait Ambulation/Gait assistance: Supervision Gait Distance (Feet): 250 Feet Assistive device: None Gait Pattern/deviations: WFL(Within Functional Limits) Gait velocity: fair   General Gait Details: on room air but noted to desat to min of 86% on room air   Stairs             Wheelchair Mobility    Modified  Rankin (Stroke Patients Only)       Balance Overall balance assessment: No apparent balance deficits (not formally assessed)                                          Cognition Arousal/Alertness: Awake/alert Behavior During Therapy: WFL for tasks assessed/performed;Impulsive Overall Cognitive Status: Within Functional Limits for tasks assessed                                        Exercises      General Comments        Pertinent Vitals/Pain Pain Assessment: No/denies pain    Home Living                      Prior Function            PT Goals (current goals can now be found in the care plan section) Acute Rehab PT Goals Patient Stated Goal: states is going home today PT Goal Formulation: With patient Time For Goal Achievement: 08/15/19 Potential to Achieve Goals: Good Progress towards PT goals: Progressing toward goals    Frequency    Min 3X/week  PT Plan Current plan remains appropriate    Co-evaluation              AM-PAC PT "6 Clicks" Mobility   Outcome Measure  Help needed turning from your back to your side while in a flat bed without using bedrails?: None Help needed moving from lying on your back to sitting on the side of a flat bed without using bedrails?: None Help needed moving to and from a bed to a chair (including a wheelchair)?: None Help needed standing up from a chair using your arms (e.g., wheelchair or bedside chair)?: None Help needed to walk in hospital room?: A Little Help needed climbing 3-5 steps with a railing? : A Little 6 Click Score: 22    End of Session   Activity Tolerance: Patient tolerated treatment well Patient left: in chair;with call bell/phone within reach Nurse Communication: Mobility status PT Visit Diagnosis: Other abnormalities of gait and mobility (R26.89)     Time: [id number] PT Time Calculation (min) (ACUTE ONLY): 12 min  Charges:  $Gait Training: 8-22  mins                     Horald Chestnut, PT    Delford Field 08/02/2019, 12:46 PM

## 2019-08-02 NOTE — Final Progress Note (Signed)
PIV removed. AVS discussed with patient and wife. Discussed Covid-19 diagnosis with wife and patient, guidance form signed and placed in chart. All questions and concerns answered. Discharge home-self care on RA with all belongings including cell phone and glasses. Pt wheeled out to [facility] with all belongings and masked. No concerns at time of discharge. Pt refused 3-1 commode.

## 2019-08-02 NOTE — Discharge Instructions (Signed)
COVID-19 COVID-19 is a respiratory infection that is caused by a virus called severe acute respiratory syndrome coronavirus 2 (SARS-CoV-2). The disease is also known as coronavirus disease or novel coronavirus. In some people, the virus may not cause any symptoms. In others, it may cause a serious infection. The infection can get worse quickly and can lead to complications, such as: Pneumonia, or infection of the lungs. Acute respiratory distress syndrome or ARDS. This is a condition in which fluid build-up in the lungs prevents the lungs from filling with air and passing oxygen into the blood. Acute respiratory failure. This is a condition in which there is not enough oxygen passing from the lungs to the body or when carbon dioxide is not passing from the lungs out of the body. Sepsis or septic shock. This is a serious bodily reaction to an infection. Blood clotting problems. Secondary infections due to bacteria or fungus. Organ failure. This is when your body's organs stop working. The virus that causes COVID-19 is contagious. This means that it can spread from person to person through droplets from coughs and sneezes (respiratory secretions). What are the causes? This illness is caused by a virus. You may catch the virus by: Breathing in droplets from an infected person. Droplets can be spread by a person breathing, speaking, singing, coughing, or sneezing. Touching something, like a table or a doorknob, that was exposed to the virus (contaminated) and then touching your mouth, nose, or eyes. What increases the risk? Risk for infection You are more likely to be infected with this virus if you: Are within 6 feet (2 meters) of a person with COVID-19. Provide care for or live with a person who is infected with COVID-19. Spend time in crowded indoor spaces or live in shared housing. Risk for serious illness You are more likely to become seriously ill from the virus if you: Are 44 years of age or  older. The higher your age, the more you are at risk for serious illness. Live in a nursing home or long-term care facility. Have cancer. Have a long-term (chronic) disease such as: Chronic lung disease, including chronic obstructive pulmonary disease or asthma. A long-term disease that lowers your body's ability to fight infection (immunocompromised). Heart disease, including heart failure, a condition in which the arteries that lead to the heart become narrow or blocked (coronary artery disease), a disease which makes the heart muscle thick, weak, or stiff (cardiomyopathy). Diabetes. Chronic kidney disease. Sickle cell disease, a condition in which red blood cells have an abnormal "sickle" shape. Liver disease. Are obese. What are the signs or symptoms? Symptoms of this condition can range from mild to severe. Symptoms may appear any time from 2 to 14 days after being exposed to the virus. They include: A fever or chills. A cough. Difficulty breathing. Headaches, body aches, or muscle aches. Runny or stuffy (congested) nose. A sore throat. New loss of taste or smell. Some people may also have stomach problems, such as nausea, vomiting, or diarrhea. Other people may not have any symptoms of COVID-19. How is this diagnosed? This condition may be diagnosed based on: Your signs and symptoms, especially if: You live in an area with a COVID-19 outbreak. You recently traveled to or from an area where the virus is common. You provide care for or live with a person who was diagnosed with COVID-19. You were exposed to a person who was diagnosed with COVID-19. A physical exam. Lab tests, which may include: Taking a sample  of fluid from the back of your nose and throat (nasopharyngeal fluid), your nose, or your throat using a swab. A sample of mucus from your lungs (sputum). Blood tests. Imaging tests, which may include, X-rays, CT scan, or ultrasound. How is this treated? At present,  there is no medicine to treat COVID-19. Medicines that treat other diseases are being used on a trial basis to see if they are effective against COVID-19. Your health care provider will talk with you about ways to treat your symptoms. For most people, the infection is mild and can be managed at home with rest, fluids, and over-the-counter medicines. Treatment for a serious infection usually takes places in a hospital intensive care unit (ICU). It may include one or more of the following treatments. These treatments are given until your symptoms improve. Receiving fluids and medicines through an IV. Supplemental oxygen. Extra oxygen is given through a tube in the nose, a face mask, or a hood. Positioning you to lie on your stomach (prone position). This makes it easier for oxygen to get into the lungs. Continuous positive airway pressure (CPAP) or bi-level positive airway pressure (BPAP) machine. This treatment uses mild air pressure to keep the airways open. A tube that is connected to a motor delivers oxygen to the body. Ventilator. This treatment moves air into and out of the lungs by using a tube that is placed in your windpipe. Tracheostomy. This is a procedure to create a hole in the neck so that a breathing tube can be inserted. Extracorporeal membrane oxygenation (ECMO). This procedure gives the lungs a chance to recover by taking over the functions of the heart and lungs. It supplies oxygen to the body and removes carbon dioxide. Follow these instructions at home: Lifestyle If you are sick, stay home except to get medical care. Your health care provider will tell you how long to stay home. Call your health care provider before you go for medical care. Rest at home as told by your health care provider. Do not use any products that contain nicotine or tobacco, such as cigarettes, e-cigarettes, and chewing tobacco. If you need help quitting, ask your health care provider. Return to your normal  activities as told by your health care provider. Ask your health care provider what activities are safe for you. General instructions Take over-the-counter and prescription medicines only as told by your health care provider. Drink enough fluid to keep your urine pale yellow. Keep all follow-up visits as told by your health care provider. This is important. How is this prevented?  There is no vaccine to help prevent COVID-19 infection. However, there are steps you can take to protect yourself and others from this virus. To protect yourself:  Do not travel to areas where COVID-19 is a risk. The areas where COVID-19 is reported change often. To identify high-risk areas and travel restrictions, check the CDC travel website: FatFares.com.br If you live in, or must travel to, an area where COVID-19 is a risk, take precautions to avoid infection. Stay away from people who are sick. Wash your hands often with soap and water for 20 seconds. If soap and water are not available, use an alcohol-based hand sanitizer. Avoid touching your mouth, face, eyes, or nose. Avoid going out in public, follow guidance from your state and local health authorities. If you must go out in public, wear a cloth face covering or face mask. Make sure your mask covers your nose and mouth. Avoid crowded indoor spaces. Stay at  least 6 feet (2 meters) away from others. Disinfect objects and surfaces that are frequently touched every day. This may include: Counters and tables. Doorknobs and light switches. Sinks and faucets. Electronics, such as phones, remote controls, keyboards, computers, and tablets. To protect others: If you have symptoms of COVID-19, take steps to prevent the virus from spreading to others. If you think you have a COVID-19 infection, contact your health care provider right away. Tell your health care team that you think you may have a COVID-19 infection. Stay home. Leave your house only to  seek medical care. Do not use public transport. Do not travel while you are sick. Wash your hands often with soap and water for 20 seconds. If soap and water are not available, use alcohol-based hand sanitizer. Stay away from other members of your household. Let healthy household members care for children and pets, if possible. If you have to care for children or pets, wash your hands often and wear a mask. If possible, stay in your own room, separate from others. Use a different bathroom. Make sure that all people in your household wash their hands well and often. Cough or sneeze into a tissue or your sleeve or elbow. Do not cough or sneeze into your hand or into the air. Wear a cloth face covering or face mask. Make sure your mask covers your nose and mouth. Where to find more information Centers for Disease Control and Prevention: PurpleGadgets.be World Health Organization: https://www.castaneda.info/ Contact a health care provider if: You live in or have traveled to an area where COVID-19 is a risk and you have symptoms of the infection. You have had contact with someone who has COVID-19 and you have symptoms of the infection. Get help right away if: You have trouble breathing. You have pain or pressure in your chest. You have confusion. You have bluish lips and fingernails. You have difficulty waking from sleep. You have symptoms that get worse. These symptoms may represent a serious problem that is an emergency. Do not wait to see if the symptoms will go away. Get medical help right away. Call your local emergency services (911 in the [place] Do not drive yourself to the hospital. Let the emergency medical personnel know if you think you have COVID-19. Summary COVID-19 is a respiratory infection that is caused by a virus. It is also known as coronavirus disease or novel coronavirus. It can cause serious infections, such as pneumonia, acute respiratory  distress syndrome, acute respiratory failure, or sepsis. The virus that causes COVID-19 is contagious. This means that it can spread from person to person through droplets from breathing, speaking, singing, coughing, or sneezing. You are more likely to develop a serious illness if you are 80 years of age or older, have a weak immune system, live in a nursing home, or have chronic disease. There is no medicine to treat COVID-19. Your health care provider will talk with you about ways to treat your symptoms. Take steps to protect yourself and others from infection. Wash your hands often and disinfect objects and surfaces that are frequently touched every day. Stay away from people who are sick and wear a mask if you are sick. This information is not intended to replace advice given to you by your health care provider. Make sure you discuss any questions you have with your health care provider. Document Revised: 04/12/2019 Document Reviewed: 07/19/2018 Elsevier Patient Education  [employer]. Orthostatic Hypotension Blood pressure is a measurement of how strongly,  or weakly, your blood is pressing against the walls of your arteries. Orthostatic hypotension is a sudden drop in blood pressure that happens when you quickly change positions, such as when you get up from sitting or lying down. Arteries are blood vessels that carry blood from your heart throughout your body. When blood pressure is too low, you may not get enough blood to your brain or to the rest of your organs. This can cause weakness, light-headedness, rapid heartbeat, and fainting. This can last for just a few seconds or for up to a few minutes. Orthostatic hypotension is usually not a serious problem. However, if it happens frequently or gets worse, it may be a sign of something more serious. What are the causes? This condition may be caused by:  Sudden changes in posture, such as standing up quickly after you have been sitting or  lying down.  Blood loss.  Loss of body fluids (dehydration).  Heart problems.  Hormone (endocrine) problems.  Pregnancy.  Severe infection.  Lack of certain nutrients.  Severe allergic reactions (anaphylaxis).  Certain medicines, such as blood pressure medicine or medicines that make the body lose excess fluids (diuretics). Sometimes, this condition can be caused by not taking medicine as directed, such as taking too much of a certain medicine. What increases the risk? The following factors may make you more likely to develop this condition:  Age. Risk increases as you get older.  Conditions that affect the heart or the central nervous system.  Taking certain medicines, such as blood pressure medicine or diuretics.  Being pregnant. What are the signs or symptoms? Symptoms of this condition may include:  Weakness.  Light-headedness.  Dizziness.  Blurred vision.  Fatigue.  Rapid heartbeat.  Fainting, in severe cases. How is this diagnosed? This condition is diagnosed based on:  Your medical history.  Your symptoms.  Your blood pressure measurement. Your health care provider will check your blood pressure when you are: ? Lying down. ? Sitting. ? Standing. A blood pressure reading is recorded as two numbers, such as "120 over 80" (or 120/80). The first ("top") number is called the systolic pressure. It is a measure of the pressure in your arteries as your heart beats. The second ("bottom") number is called the diastolic pressure. It is a measure of the pressure in your arteries when your heart relaxes between beats. Blood pressure is measured in a unit called mm Hg. Healthy blood pressure for most adults is 120/80. If your blood pressure is below 90/60, you may be diagnosed with hypotension. Other information or tests that may be used to diagnose orthostatic hypotension include:  Your other vital signs, such as your heart rate and temperature.  Blood  tests.  Tilt table test. For this test, you will be safely secured to a table that moves you from a lying position to an upright position. Your heart rhythm and blood pressure will be monitored during the test. How is this treated? This condition may be treated by:  Changing your diet. This may involve eating more salt (sodium) or drinking more water.  Taking medicines to raise your blood pressure.  Changing the dosage of certain medicines you are taking that might be lowering your blood pressure.  Wearing compression stockings. These stockings help to prevent blood clots and reduce swelling in your legs. In some cases, you may need to go to the hospital for:  Fluid replacement. This means you will receive fluids through an IV.  Blood replacement. This  means you will receive donated blood through an IV (transfusion).  Treating an infection or heart problems, if this applies.  Monitoring. You may need to be monitored while medicines that you are taking wear off. Follow these instructions at home: Eating and drinking   Drink enough fluid to keep your urine pale yellow.  Eat a healthy diet, and follow instructions from your health care provider about eating or drinking restrictions. A healthy diet includes: ? Fresh fruits and vegetables. ? Whole grains. ? Lean meats. ? Low-fat dairy products.  Eat extra salt only as directed. Do not add extra salt to your diet unless your health care provider told you to do that.  Eat frequent, small meals.  Avoid standing up suddenly after eating. Medicines  Take over-the-counter and prescription medicines only as told by your health care provider. ? Follow instructions from your health care provider about changing the dosage of your current medicines, if this applies. ? Do not stop or adjust any of your medicines on your own. General instructions   Wear compression stockings as told by your health care provider.  Get up slowly from  lying down or sitting positions. This gives your blood pressure a chance to adjust.  Avoid hot showers and excessive heat as directed by your health care provider.  Return to your normal activities as told by your health care provider. Ask your health care provider what activities are safe for you.  Do not use any products that contain nicotine or tobacco, such as cigarettes, e-cigarettes, and chewing tobacco. If you need help quitting, ask your health care provider.  Keep all follow-up visits as told by your health care provider. This is important. Contact a health care provider if you:  Vomit.  Have diarrhea.  Have a fever for more than 2-3 days.  Feel more thirsty than usual.  Feel weak and tired. Get help right away if you:  Have chest pain.  Have a fast or irregular heartbeat.  Develop numbness in any part of your body.  Cannot move your arms or your legs.  Have trouble speaking.  Become sweaty or feel light-headed.  Faint.  Feel short of breath.  Have trouble staying awake.  Feel confused. Summary  Orthostatic hypotension is a sudden drop in blood pressure that happens when you quickly change positions.  Orthostatic hypotension is usually not a serious problem.  It is diagnosed by having your blood pressure taken lying down, sitting, and then standing.  It may be treated by changing your diet or adjusting your medicines. This information is not intended to replace advice given to you by your health care provider. Make sure you discuss any questions you have with your health care provider. Document Revised: 12/07/2017 Document Reviewed: 12/07/2017 Elsevier Patient Education  [employer].

## 2019-08-02 NOTE — Consult Note (Addendum)
   [facility] CM Inpatient Consult   08/02/2019  Colton Porter [birth year] [id number]    Patientscreenedforhavingless than 7 days and 30-day readmissions, 2 hospitalizations and 3 ED visits in the past 6 months, with 27% high risk score forunplanned readmission; and to check forpotentialneeds of[facility]) Care Management servicesunder his [employer] Medicare insurancebenefit.   Perchart review and MDbrief narrative reveals as follows: Colton Viergutz Holbrookis a 54 y.o.malewith medical history significant ofcirrhosis s/p liver transplant in 2017 now just on tacrolimus, DM2, HTN, CKD stage 3.  Patient was diagnosed with COVID on 1/18, was admitted for hypoxic resp failure due to COVID to [facility] from 1/21 to 1/26. Discharged home on 3L O2 due to persistent hypoxia; was discharged on oral steroids which he finished on 07/29/2019 thereafter, he became lightheaded at home and presented to the ER, hypotensive and orthostatic and was admitted again. [Pneumonia due to COVID-19/acute respiratory failure with hypoxia, orthostatic hypotension/ syncope]   Calledpatientandwas able to speak tohimover the phone. HIPAA verification obtained.Patient reportsthat plan is for him to discharge hometoday. [facility] care management services discussed with patient. He denies having current needsregarding medications (self-managed);pharmacy ([facility] [facility] and [facility]); transportation(self/ wife). Patient lives with wife at home but reports having other family members to support with needs. Patient mentioned that wife was also diagnosed with Covid.  Patient has indicated lack of awareness in managing chronic health issues like DM (poor controlled-with A1c of 10.1). He admits to seldomly checking blood pressure and blood sugar at home and not following diet restrictions ("eating what I want to eat"). Reminded him importance of follow-up with primary care  provider post discharge.   PatientendorsesDr. Ria Bush with [place] [facility] as his primary care provider, whose office is listed as providing transition of care follow-up.  Patient verbally agreed to [facility] care management services  withfollow-up calls after discharge.  Spoke with transition of care SW for disposition/ needs and confirmed that patient is discharging home today, home oxygen already set-up on previous discharge. Notified that [facility] care management will be following post discharge.  Referral made to [facility]) RN CM coordinator for complex care and disease management of poor controlled DM and hypotension (orthostatic); and to assess further needs post discharge.   For questions and additional information, please contact:  Vanna Sailer A. Xavier Fournier, BSN, RN-BC [facility] Liaison Cell: [phone]

## 2019-08-02 NOTE — Discharge Summary (Signed)
[facility]  Physician Discharge Summary   Patient ID: Colton Porter MRN: [id number] DOB/AGE: Feb 20, 1966 54 y.o.  Admit date: 07/30/2019 Discharge date: 08/02/2019  PCP: Ria Bush, MD  DISCHARGE DIAGNOSES:  Orthostatic hypotension with syncope, improved Pneumonia due to COVID-19 Acute renal failure, resolved History of renal transplant Diabetes mellitus type 2 uncontrolled with hyperglycemia  RECOMMENDATIONS FOR OUTPATIENT FOLLOW UP: 1. Patient is antihypertensives as well as diuretics being placed on hold.  PCP to please determine timing of resumption of these medications.    Home Health: None Equipment/Devices: He has oxygen at home  CODE STATUS: Full code  DISCHARGE CONDITION: fair  Diet recommendation: Modified carbohydrate  INITIAL HISTORY: Colton Porter a 54 y.o.malewith medical history significant ofcirrhosis s/p liver transplant in 2017 now just on tacrolimus, DM2, HTN, CKD stage 3. Patient was diagnosed with COVID on 1/18. Patient was admitted for hypoxic resp failure due to COVID to [facility] from 1/21 to 1/26. Discharged home on 3L O2 due to persistent hypoxia.  He was discharged on oral steroids which she finished on 07/29/2019 thereafter he became lightheaded at home and presented to the ER hypotensive and orthostatic.  Was admitted again.   HOSPITAL COURSE:   Pneumonia due to COVID-19/acute respiratory failure with hypoxia Patient recently hospitalized for same.  He was discharged on home oxygen at 3 L/min.    Seems to be stable.  Continue with incentive spirometry mobilization.     Orthostatic hypotension/syncope Syncope most likely due to orthostatic hypotension due to hypovolemia.    Patient was hydrated.  His symptoms improved.  Orthostatic vital signs improved.  Able to ambulate.  TED stockings prescribed.  Antihypertensives placed on hold for now.  PCP to determine when these can be resumed based on patient's vital signs.   Echocardiogram done during this hospitalization showed normal systolic function.  RV function was normal as well.  Grade 1 diastolic dysfunction noted.    Acute renal failure Presented with a creatinine of 1.97.  Baseline is around 1.3.  Hydrated.  Renal function is now back to baseline.   Elevated D-dimer Most likely due to recent COVID-19.  Lower extremity Doppler studies negative for DVT.  Status post renal transplant Stable.  Continue tacrolimus.  Diabetes mellitus type 2, uncontrolled with hyperglycemia Continue home medication regimen.  Follow-up with PCP.  HbA1c was 10.1 when recently checked.    Obesity Estimated body mass index is 44.35 kg/m as calculated from the following:   Height as of this encounter: _0  (1.676 m).   Weight as of this encounter: 124.6 kg.   Overall stable.  Okay for discharge home today.   PERTINENT LABS:  The results of significant diagnostics from this hospitalization (including imaging, microbiology, ancillary and laboratory) are listed below for reference.    Microbiology: Recent Results (from the past 240 hour(s))  Blood culture (routine x 2)     Status: None (Preliminary result)   Collection Time: 07/30/19 10:38 PM   Specimen: BLOOD  Result Value Ref Range Status   Specimen Description BLOOD RIGHT ANTECUBITAL  Final   Special Requests   Final    BOTTLES DRAWN AEROBIC AND ANAEROBIC Blood Culture adequate volume   Culture   Final    NO GROWTH 2 DAYS Performed at [facility], [address], [place], [place] [address]    Report Status PENDING  Incomplete  Blood culture (routine x 2)     Status: None (Preliminary result)   Collection Time: 07/30/19 10:42  PM   Specimen: BLOOD RIGHT WRIST  Result Value Ref Range Status   Specimen Description BLOOD RIGHT WRIST  Final   Special Requests   Final    BOTTLES DRAWN AEROBIC AND ANAEROBIC Blood Culture results may not be optimal due to an inadequate volume of blood received in  culture bottles   Culture   Final    NO GROWTH 2 DAYS Performed at [facility], [place] [address] [place], [place] [address]    Report Status PENDING  Incomplete     Labs:  COVID-19 Labs  Recent Labs    07/30/19 2359  DDIMER 1.76*    Lab Results  Component Value Date   SARSCOV2NAA POSITIVE (A) 07/15/2019      Basic Metabolic Panel: Recent Labs  Lab 07/30/19 2018 07/31/19 0249 08/01/19 0344 08/02/19 0109  NA 131* 134* 136 138  K 3.8 3.7 3.4* 3.8  CL 98 103 103 107  CO2 21* _0 GLUCOSE 322* 277* 227* 184*  BUN 37* 32* 22* 16  CREATININE 1.97* 1.60* 1.21 1.18  CALCIUM 8.6* 8.1* 8.5* 8.4*   Liver Function Tests: Recent Labs  Lab 07/31/19 0130  AST 14*  ALT 22  ALKPHOS 70  BILITOT 0.9  PROT 5.8*  ALBUMIN 2.4*   CBC: Recent Labs  Lab 07/30/19 2018 07/31/19 0249 08/01/19 0344 08/02/19 0109  WBC 8.5 8.1 5.2 4.8  HGB 12.9* 12.1* 11.1* 10.9*  HCT 38.2* 36.3* 33.4* 32.3*  MCV 84.7 84.0 85.4 86.1  PLT 170 161 128* 129*   BNP: BNP (last 3 results) Recent Labs    07/19/19 1237  BNP 181.0*     CBG: Recent Labs  Lab 08/01/19 2004 08/01/19 2354 08/02/19 0410 08/02/19 0736 08/02/19 1151  GLUCAP 273* 201* 178* 164* 193*     IMAGING STUDIES  DG Chest Portable 1 View  Result Date: 07/30/2019 CLINICAL DATA:  54 year old male with shortness of breath and chest pain. EXAM: PORTABLE CHEST 1 VIEW COMPARISON:  Chest radiograph dated 07/21/2019. FINDINGS: Overall interval improvement of bilateral airspace opacities, most consistent with pneumonia, likely viral or atypical in etiology. No pleural effusion or pneumothorax. Stable cardiomediastinal silhouette. No acute osseous pathology. IMPRESSION: Improved bilateral airspace infiltrates. Electronically Signed   By: Anner Crete M.D.   On: 07/30/2019 20:35    ECHOCARDIOGRAM COMPLETE  Result Date: 07/31/2019   ECHOCARDIOGRAM REPORT   Patient Name:   Colton Porter Date of Exam: 07/31/2019  Medical Rec #:  [id number]        Height:       66.0 in Accession #:    [id number]       Weight:       280.0 lb Date of Birth:  [birth year]        BSA:          2.31 m Patient Age:    42 years         BP:           113/69 mmHg Patient Gender: M                HR:           95 bpm. Exam Location:  Inpatient Procedure: 2D Echo Indications:    780.2 syncope  History:        Patient has prior history of Echocardiogram examinations, most                 recent 06/09/2016. CHF; Risk Factors:Hypertension,  Diabetes and                 Dyslipidemia. ETOH Use. covid +.  Sonographer:    Jannett Celestine RDCS (AE) Referring Phys: [place]  1. Left ventricular ejection fraction, by visual estimation, is 65 to 70%. The left ventricle has hyperdynamic function. There is moderately increased left ventricular hypertrophy.  2. Left ventricular diastolic parameters are consistent with Grade I diastolic dysfunction (impaired relaxation).  3. The left ventricle has no regional wall motion abnormalities.  4. Global right ventricle has normal systolic function.The right ventricular size is normal. Mildly increased right ventricular wall thickness.  5. Left atrial size was normal.  6. Right atrial size was normal.  7. The mitral valve is grossly normal. No evidence of mitral valve regurgitation.  8. The tricuspid valve is grossly normal.  9. The tricuspid valve is grossly normal. Tricuspid valve regurgitation is trivial. 10. The aortic valve is grossly normal. Aortic valve regurgitation is not visualized. 11. The pulmonic valve was grossly normal. Pulmonic valve regurgitation is not visualized. 12. The interatrial septum was not well visualized. 13. Very poor acoustic windows due to patient's body habitus. LV and RV function are grossly normal. No obvious valvular abnormalities noted. FINDINGS  Left Ventricle: Left ventricular ejection fraction, by visual estimation, is 65 to 70%. The left ventricle has hyperdynamic  function. The left ventricle has no regional wall motion abnormalities. There is moderately increased left ventricular hypertrophy. Left ventricular diastolic parameters are consistent with Grade I diastolic dysfunction (impaired relaxation). Right Ventricle: The right ventricular size is normal. Mildly increased right ventricular wall thickness. Global RV systolic function is has normal systolic function. Left Atrium: Left atrial size was normal in size. Right Atrium: Right atrial size was normal in size Pericardium: There is no evidence of pericardial effusion. Mitral Valve: The mitral valve is grossly normal. No evidence of mitral valve regurgitation. Tricuspid Valve: The tricuspid valve is grossly normal. Tricuspid valve regurgitation is trivial. Aortic Valve: The aortic valve is grossly normal. Aortic valve regurgitation is not visualized. Pulmonic Valve: The pulmonic valve was grossly normal. Pulmonic valve regurgitation is not visualized. Pulmonic regurgitation is not visualized. Aorta: The aortic root and ascending aorta are structurally normal, with no evidence of dilitation. Venous: The inferior vena cava was not well visualized. IAS/Shunts: The interatrial septum was not well visualized.  LEFT VENTRICLE PLAX 2D LVIDd:         3.63 cm LVIDs:         2.39 cm LV PW:         1.33 cm LV IVS:        1.45 cm LVOT diam:     2.10 cm LV SV:         36 ml LV SV Index:   14.22 LVOT Area:     3.46 cm  RIGHT VENTRICLE RV S prime:     23.00 cm/s TAPSE (M-mode): 1.7 cm LEFT ATRIUM           Index       RIGHT ATRIUM           Index LA diam:      3.50 cm 1.52 cm/m  RA Area:     16.30 cm LA Vol (A4C): 54.3 ml 23.53 ml/m RA Volume:   42.10 ml  18.25 ml/m  AORTIC VALVE LVOT Vmax:   94.60 cm/s LVOT Vmean:  66.100 cm/s LVOT VTI:    0.148 m  AORTA Ao  Root diam: 3.10 cm MITRAL VALVE MV Area (PHT): 2.69 cm             SHUNTS MV PHT:        81.78 msec           Systemic VTI:  0.15 m MV Decel Time: 282 msec              Systemic Diam: 2.10 cm MV E velocity: 62.40 cm/s 103 cm/s MV A velocity: 72.20 cm/s 70.3 cm/s MV E/A ratio:  0.86       1.5  Glori Bickers MD Electronically signed by Glori Bickers MD Signature Date/Time: 07/31/2019/7:53:11 PM    Final    VAS [name] LOWER EXTREMITY VENOUS (DVT)  Result Date: 08/01/2019  Lower Venous DVTStudy Indications: Covid-19.  Comparison Study: Prior study form 11/03/15 is available for comparison Performing Technologist: Sharion Dove RVS  Examination Guidelines: A complete evaluation includes B-mode imaging, spectral Doppler, color Doppler, and power Doppler as needed of all accessible portions of each vessel. Bilateral testing is considered an integral part of a complete examination. Limited examinations for reoccurring indications may be performed as noted. The reflux portion of the exam is performed with the patient in reverse Trendelenburg.  +---------+---------------+---------+-----------+----------+--------------+ RIGHT    CompressibilityPhasicitySpontaneityPropertiesThrombus Aging +---------+---------------+---------+-----------+----------+--------------+ CFV      Full           Yes      Yes                                 +---------+---------------+---------+-----------+----------+--------------+ SFJ      Full                                                        +---------+---------------+---------+-----------+----------+--------------+ FV Prox  Full                                                        +---------+---------------+---------+-----------+----------+--------------+ FV Mid   Full                                                        +---------+---------------+---------+-----------+----------+--------------+ FV DistalFull                                                        +---------+---------------+---------+-----------+----------+--------------+ PFV      Full                                                         +---------+---------------+---------+-----------+----------+--------------+ POP      Full  Yes      Yes                                 +---------+---------------+---------+-----------+----------+--------------+ PTV      Full                                                        +---------+---------------+---------+-----------+----------+--------------+ PERO     Full                                                        +---------+---------------+---------+-----------+----------+--------------+   +---------+---------------+---------+-----------+----------+--------------+ LEFT     CompressibilityPhasicitySpontaneityPropertiesThrombus Aging +---------+---------------+---------+-----------+----------+--------------+ CFV      Full           Yes      Yes                                 +---------+---------------+---------+-----------+----------+--------------+ SFJ      Full                                                        +---------+---------------+---------+-----------+----------+--------------+ FV Prox  Full                                                        +---------+---------------+---------+-----------+----------+--------------+ FV Mid   Full                                                        +---------+---------------+---------+-----------+----------+--------------+ FV DistalFull                                                        +---------+---------------+---------+-----------+----------+--------------+ PFV      Full                                                        +---------+---------------+---------+-----------+----------+--------------+ POP      Full           Yes      Yes                                 +---------+---------------+---------+-----------+----------+--------------+ PTV  Full                                                         +---------+---------------+---------+-----------+----------+--------------+ PERO     Full                                                        +---------+---------------+---------+-----------+----------+--------------+     Summary: BILATERAL: - No evidence of deep vein thrombosis seen in the lower extremities, bilaterally.  RIGHT: - Findings appear essentially unchanged compared to previous examination.  LEFT: - Findings appear essentially unchanged compared to previous examination.  *See table(s) above for measurements and observations. Electronically signed by Servando Snare MD on 08/01/2019 at 2:49:11 PM.    Final     DISCHARGE EXAMINATION: Vitals:   08/02/19 0411 08/02/19 0735 08/02/19 0802 08/02/19 1129  BP: (!) 147/76 139/80  130/85  Pulse: 68 75  78  Resp: 19 (!) 22  19  Temp: 98.3 F (36.8 C) 97.8 F (36.6 C) 97.8 F (36.6 C) 98.1 F (36.7 C)  TempSrc: Oral Oral  Oral  SpO2: 91% 93%  95%  Weight:      Height:       General appearance: Awake alert.  In no distress Resp: Normal effort at rest.  Few crackles at the bases.  No wheezing or rhonchi. Cardio: S1-S2 is normal regular.  No S3-S4.  No rubs murmurs or bruit GI: Abdomen is soft.  Nontender nondistended.  Bowel sounds are present normal.  No masses organomegaly Extremities: No edema.  Full range of motion of lower extremities. Neurologic: Alert and oriented x3.  No focal neurological deficits.    DISPOSITION: Home  Discharge Instructions    AMB Referral to [facility]   Complete by: As directed    Colton Porter a 54 y.o.malewith medical history significant ofcirrhosis s/p liver transplant in 2017 now just on tacrolimus, DM2, HTN, CKD stage 3.  [Pneumonia due to COVID-19/acute respiratory failure with hypoxia, orthostatic hypotension/ syncope] Patient was diagnosed with COVID on 1/18, was admitted for hypoxic resp failure due to COVID to [facility] from 1/21 to 1/26. Discharged home on 3L O2 due to persistent  hypoxia; was discharged on oral steroids which he finished on 07/29/2019 thereafter, he became lightheaded at home and presented to the ER, hypotensive and orthostatic and was admitted again.   Patient lives with wife at home but reports having other family members to support with needs. Patient mentioned that wife was also diagnosed with Covid.  Patient has indicated lack of awareness in managing chronic health issues like DM (poor controlled-with A1c of 10.1). He admits to seldomly checking blood pressure and blood sugar at home and not following diet restrictions ("eating what I want to eat").  PatientendorsesDr. Ria Bush with [place] [facility] as his primary care provider, whose office is listed as providing transition of care follow-up.  Patient verbally agreed to [facility] care management services  withfollow-up calls after discharge.   NOTE: Please send a consent form to patient in order to obtain written consent for [facility] services.     Reason for consult: Referral made to [facility]) RN [employer] coordinator for complex care and disease management of poor controlled DM and hypotension (orthostatic); and to assess further needs post discharge.   Diagnoses of:  Diabetes Other     Other Diagnosis: HTN, hypotension (orthostatic)   Expected date of contact: 1-3 days (reserved for hospital discharges)   Ambulatory referral to Pulmonology   Complete by: As directed    Follow-up for COVID-19 in 3 to 4 weeks.  On home oxygen.   Call MD for:  difficulty breathing, headache or visual disturbances   Complete by: As directed    Call MD for:  extreme fatigue   Complete by: As directed    Call MD for:  persistant dizziness or light-headedness   Complete by: As directed    Call MD for:  persistant nausea and vomiting   Complete by: As directed    Call MD for:  severe uncontrolled pain   Complete by: As directed    Diet Carb Modified   Complete by: As directed     Discharge instructions   Complete by: As directed    Please be sure to follow-up with your doctor before reinitiating your blood pressure medications.  Please check your blood pressures every day at home.  If possible check your blood pressure lying, sitting and then standing up and report this to your primary care provider.  Use your TED stockings while you are awake.    Get up slowly from a sitting or lying position and do not start walking immediately.  Wait for a few minutes to let your body acclimatize to change in position and then start walking if you do not feel dizzy or lightheaded.  You were cared for by a hospitalist during your hospital stay. If you have any questions about your discharge medications or the care you received while you were in the hospital after you are discharged, you can call the unit and asked to speak with the hospitalist on call if the hospitalist that took care of you is not available. Once you are discharged, your primary care physician will handle any further medical issues. Please note that NO REFILLS for any discharge medications will be authorized once you are discharged, as it is imperative that you return to your primary care physician (or establish a relationship with a primary care physician if you do not have one) for your aftercare needs so that they can reassess your need for medications and monitor your lab values. If you do not have a primary care physician, you can call [phone] for a physician referral.   Increase activity slowly   Complete by: As directed         Allergies as of 08/02/2019      Reactions   Tolmetin Rash   cirrhosis   Ambien [zolpidem Tartrate] Other (See Comments)   Over-toxicity from liver failure   Morphine And Related    Hallucinations.   Hydrochlorothiazide W-triamterene Other (See Comments)   REACTION: dizzy, nausea   Lisinopril Other (See Comments)   REACTION: cough, decreased libido      Medication List    STOP  taking these medications   amLODipine 10 MG tablet Commonly known as: NORVASC   doxazosin 8 MG tablet Commonly known as: CARDURA   furosemide 40 MG tablet Commonly known as: LASIX   losartan 50 MG tablet Commonly known as: COZAAR     TAKE these medications   Accu-Chek Guide w/Device Kit 1 Units by Does not apply  route as directed.   acetaminophen 500 MG tablet Commonly known as: TYLENOL Take 500 mg by mouth every 6 (six) hours as needed for mild pain.   albuterol 108 (90 Base) MCG/ACT inhaler Commonly known as: VENTOLIN HFA Inhale 2 puffs into the lungs every 6 (six) hours as needed for wheezing or shortness of breath.   allopurinol 300 MG tablet Commonly known as: ZYLOPRIM Take 300 mg by mouth daily.   Aspir-Low 81 MG EC tablet Generic drug: aspirin Take 1 tablet (81 mg total) by mouth daily.   [employer] SPECTRAVITE PO Take 1 tablet by mouth at bedtime.   docusate sodium 100 MG capsule Commonly known as: COLACE Take 100 mg by mouth daily as needed for mild constipation.   escitalopram 10 MG tablet Commonly known as: LEXAPRO TAKE 1 TABLET EVERY DAY What changed: when to take this   FISH OIL PO Take 1 tablet by mouth at bedtime.   fluticasone 50 MCG/ACT nasal spray Commonly known as: FLONASE Place 2 sprays into both nostrils daily. What changed:   when to take this  reasons to take this   FreeStyle Libre 14 Day Reader Kerrin Mo 1 Units by Does not apply route as directed.   FreeStyle [employer] 1 Units by Does not apply route as directed.   glimepiride 2 MG tablet Commonly known as: AMARYL TAKE 1 TABLET BY MOUTH DAILY WITH BREAKFAST   glucose blood test strip Commonly known as: Accu-Chek Guide Check sugars three times daily and as needed when feeling ill E11.65, insulin use   Insulin Pen Needle 31G X 5 MM Misc USE TO INJECT INSULIN DAILY   Lancets Ultra Thin 30G Misc Check sugars three times daily and as needed when feeling ill E11.65, insulin  use   Lantus SoloStar 100 UNIT/ML Solostar Pen Generic drug: Insulin Glargine INJECT 15 UNITS INTO THE SKIN DAILY AT 10 PM What changed: See the new instructions.   loratadine 10 MG tablet Commonly known as: CLARITIN Take 10 mg by mouth daily.   lovastatin 20 MG tablet Commonly known as: MEVACOR Take 1 tablet (20 mg total) by mouth once a week.   omeprazole 40 MG capsule Commonly known as: PRILOSEC TAKE 1 CAPSULE EVERY DAY   tacrolimus 1 MG capsule Commonly known as: PROGRAF Take 1-2 mg by mouth See admin instructions. Take 2 mg by mouth in the morning, then take 1 mg  by mouth in the evening   Tussin DM Cough + Chest 10-100 MG/5ML liquid Generic drug: dextromethorphan-guaiFENesin Take 10 mLs by mouth every 4 (four) hours as needed for cough.   Vitamin D3 50 MCG (2000 UT) Tabs Take 1 tablet by mouth at bedtime.            Durable Medical Equipment  (From admission, onward)         Start     Ordered   08/01/19 1028  DME 3-in-1  (Discharge Planning)  Once     08/01/19 1027           Follow-up Information    Ria Bush, MD. Schedule an appointment as soon as possible for a visit in 1 week(s).   Specialty: Family Medicine Why: For further follow-up of orthostatic hypotension and to discuss reinitiating antihypertensives. Contact information: [place] [address] [phone]           TOTAL DISCHARGE TIME: 48 minutes  [place]  [facility] Pager on [email or web address]  08/02/2019, 12:59 PM

## 2019-08-02 NOTE — Telephone Encounter (Signed)
Left message on voicemail to return my call- need to complete TCM.

## 2019-08-04 LAB — CULTURE, BLOOD (ROUTINE X 2)
Culture: NO GROWTH
Culture: NO GROWTH
Special Requests: ADEQUATE

## 2019-08-05 ENCOUNTER — Other Ambulatory Visit: Payer: Self-pay

## 2019-08-05 NOTE — Telephone Encounter (Signed)
Hold BP meds for now until hosp f/u visit, but monitor BP and let us know if trending up and consistently >140/90

## 2019-08-05 NOTE — Patient Outreach (Signed)
[facility]) Care Management  08/05/2019  GINO SANTORA Dec 01, 1965 [id number]     Transition of Care Referral  Referral Date: 08/02/2019 Referral Source: [place] Date of Admission: 07/30/2019 Diagnosis: "Syncope" Date of Discharge: 08/02/2019 Facility: [place]: [facility]    Outreach attempt # 1 to patient. No answer. RN CM left HIPAA compliant voicemail message along with contact info.    Plan: RN CM will make outreach attempt to patient within 3-4 business days. RN CM will send unsuccessful outreach letter to patient.   Enzo Montgomery, RN,BSN,CCM [facility] Telephonic Care Management Coordinator Direct Phone: [phone] Toll Free: [phone] Fax: [phone]

## 2019-08-05 NOTE — Telephone Encounter (Signed)
Unsuccessful attempt # 2- Left message notifying patient to return call for TCM.

## 2019-08-05 NOTE — Telephone Encounter (Signed)
Transition Care Management Follow-up Telephone Call  Date of discharge and from where: 08/02/2019, [facility]  How have you been since you were released from the hospital? Patient is having teeth pain, head congestion and headaches.   Any questions or concerns? Yes , Patient states that hospital stopped all his blood pressure medications and he is having headaches. He needs to know if he should restart them or not.   Items Reviewed:  Did the pt receive and understand the discharge instructions provided? Yes   Medications obtained and verified? Yes   Any new allergies since your discharge? No   Dietary orders reviewed? Yes  Do you have support at home? Yes   Functional Questionnaire: (I = Independent and D = Dependent) ADLs: I  Bathing/Dressing- I  Meal Prep- I  Eating- I  Maintaining continence- I  Transferring/Ambulation- I  Managing Meds- I  Follow up appointments reviewed:   PCP Hospital f/u appt confirmed? Yes  Scheduled to see Dr. [name] on 08/15/2019 @ 12 pm.  [facility] f/u appt confirmed? N/A   Are transportation arrangements needed? No   If their condition worsens, is the pt aware to call PCP or go to the Emergency Dept.? Yes  Was the patient provided with contact information for the PCP's office or ED? No  Was to pt encouraged to call back with questions or concerns? Yes

## 2019-08-05 NOTE — Telephone Encounter (Signed)
Spoke with pt relaying Dr. G's message.  Verbalizes understanding.  

## 2019-08-07 ENCOUNTER — Other Ambulatory Visit: Payer: Self-pay

## 2019-08-07 ENCOUNTER — Encounter: Payer: Self-pay | Admitting: Family Medicine

## 2019-08-07 NOTE — Patient Outreach (Signed)
[facility]) Care Management  08/07/2019  Colton Porter Nov 23, 1965 [id number]   Transition of Care Referral  Referral Date: 08/02/2019 Referral Source: [place] Date of Admission: 07/30/2019 Diagnosis: "Syncope" Date of Discharge: 08/02/2019 Facility: [place]: [facility]    Outreach attempt #2 to patient.  No answer at present.     Plan: RN CM will make outreach attempt to patient within 3-4 business days.   Enzo Montgomery, RN,BSN,CCM [place] Management Telephonic Care Management Coordinator Direct Phone: [phone] Toll Free: [phone] Fax: [phone]

## 2019-08-08 ENCOUNTER — Other Ambulatory Visit: Payer: Self-pay

## 2019-08-08 DIAGNOSIS — E1169 Type 2 diabetes mellitus with other specified complication: Secondary | ICD-10-CM

## 2019-08-08 DIAGNOSIS — Z794 Long term (current) use of insulin: Secondary | ICD-10-CM

## 2019-08-08 MED ORDER — FREESTYLE LIBRE 14 DAY SENSOR MISC: 2 refills | Status: DC

## 2019-08-08 NOTE — Telephone Encounter (Signed)
Received faxed for [facility] sensors.  E-scribed rx.

## 2019-08-08 NOTE — Patient Outreach (Signed)
[facility]) Care Management  08/08/2019  Colton Porter [birth year] [id number]   Transition of Care Referral  Referral Date:08/02/2019 Referral [place] Date of Admission:07/30/2019 Diagnosis:"Syncope" Date of Discharge:08/02/2019 Facility:[facility] Insurance:[employer] Medicare   Outreach attempt #3 to patient and unsuccessful at reaching pt.      Plan: RN CM will close case if no response from letter mailed to patient.    Enzo Montgomery, RN,BSN,CCM [facility] Telephonic Care Management Coordinator Direct Phone: [phone] Toll Free: [phone] Fax: [phone]

## 2019-08-08 NOTE — Telephone Encounter (Signed)
See mychart message Pt was needing 3L O2 by [place] when last seen 06/2019 after covid pneumonia.  plz call to check on oxygen use - what is his pulse ox running off oxygen, and is it dropping when ambulatory?  They are saying he doesn't need O2 tanks anymore and wants it picked up - but I'm not sure he's able to wean off yet.Marland KitchenMarland Kitchen

## 2019-08-09 ENCOUNTER — Ambulatory Visit: Payer: Self-pay

## 2019-08-09 NOTE — Telephone Encounter (Addendum)
Spoke with pt asking about O2 sat.  Says he has not used O2 since 08/02/19.  SpO2 has been 93-94% at rest but has not checked it today.  Says he has been doing some walking and gets a little winded but quickly recovers when he rests.  Pt will check SpO2 today at rest, when walking and again at rest.  He will call back with results.  FYI to Dr. Darnell Level.

## 2019-08-12 ENCOUNTER — Telehealth: Payer: Self-pay | Admitting: Family Medicine

## 2019-08-12 NOTE — Chronic Care Management (AMB) (Signed)
  Chronic Care Management   Note  08/12/2019 Name: Colton Porter MRN: [id number] DOB: September 28, 1965  Colton Porter is a 54 y.o. year old male who is a primary care patient of Ria Bush, MD. I reached out to [name] by phone today in response to a referral sent by [name]'s PCP, Ria Bush, MD.   [name] was given information about Chronic Care Management services today including:  1. CCM service includes personalized support from designated clinical staff supervised by his physician, including individualized plan of care and coordination with other care providers 2. 24/7 contact phone numbers for assistance for urgent and routine care needs. 3. Service will only be billed when office clinical staff spend 20 minutes or more in a month to coordinate care. 4. Only one practitioner may furnish and bill the service in a calendar month. 5. The patient may stop CCM services at any time (effective at the end of the month) by phone call to the office staff. 6. The patient will be responsible for cost sharing (co-pay) of up to 20% of the service fee (after annual deductible is met).  Patient agreed to services and verbal consent obtained.   Follow up plan:   Raynicia Dukes [employer] Scheduler

## 2019-08-13 ENCOUNTER — Telehealth: Payer: Self-pay

## 2019-08-13 DIAGNOSIS — I10 Essential (primary) hypertension: Secondary | ICD-10-CM

## 2019-08-13 DIAGNOSIS — K219 Gastro-esophageal reflux disease without esophagitis: Secondary | ICD-10-CM

## 2019-08-13 NOTE — Telephone Encounter (Signed)
I would like to request a referral for Colton Porter to chronic care management pharmacy services focusing on the following conditions:   Essential hypertension, benign  [I10]  GERD [K21.9]  Debbora Dus, PharmD Clinical Pharmacist [facility] [phone]

## 2019-08-15 ENCOUNTER — Ambulatory Visit: Payer: Medicare HMO | Admitting: Family Medicine

## 2019-08-15 NOTE — Chronic Care Management (AMB) (Signed)
Chronic Care Management Pharmacy  Name: Colton Porter  MRN: [id number] DOB: May 06, 1966  Chief Complaint/ HPI  Colton Porter,  54 y.o., male presents for their Initial CCM visit with the clinical pharmacist via telephone.  PCP : Colton Bush, MD  Their chronic conditions include: hypertension, heart failure, orthostatic hypotension, allergic rhinitis, type 2 diabetes, dyslipidemia, gout, depression, alcohol cirrhosis/status post liver transplant, portal hypertensive gastropathy, neuropathy, CKD  Patient concerns: recent hospitalizations (07/30/19 and 07/18/19) with medication changes: broken rib after fall/syncope and COVID-19/pneumonia - taken off of all blood pressure medication (amlodipine, doxazosin, furosemide, and losartan); reduced his Lantus from 20 units qPM to 15 units qPM; has not used oxygen since last Friday  Office Visits:   07/25/18: Colton Porter - hospitalization follow up - increase Lantus to 20 units, send rx for freestyle libre CGM (continued from hospital). D/c cheratussin for cough, not helping; f/u in 3 weeks to assess oxygen status  04/23/19: Colton Porter - DM improved, no changes   Consult Visit: none in past 6 months   Allergies  Allergen Reactions  . Tolmetin Rash    cirrhosis  . Ambien [Zolpidem Tartrate] Other (See Comments)    Over-toxicity from liver failure  . Morphine And Related     Hallucinations.  . Hydrochlorothiazide W-Triamterene Other (See Comments)    REACTION: dizzy, nausea  . Lisinopril Other (See Comments)    REACTION: cough, decreased libido   Medications: Outpatient Encounter Medications as of 08/16/2019  Medication Sig Note  . acetaminophen (TYLENOL) 500 MG tablet Take 500 mg by mouth every 6 (six) hours as needed for mild pain.   Marland Kitchen albuterol (VENTOLIN HFA) 108 (90 Base) MCG/ACT inhaler Inhale 2 puffs into the lungs every 6 (six) hours as needed for wheezing or shortness of breath.   . allopurinol (ZYLOPRIM) 300 MG tablet Take  300 mg by mouth daily.    . ASPIR-LOW 81 MG EC tablet Take 1 tablet (81 mg total) by mouth daily.   . Blood Glucose Monitoring Suppl (ACCU-CHEK GUIDE) w/Device KIT 1 Units by Does not apply route as directed.   . Cholecalciferol (VITAMIN D3) 2000 units TABS Take 1 tablet by mouth at bedtime.   . Continuous Blood Gluc Receiver (FREESTYLE LIBRE 14 DAY READER) DEVI 1 Units by Does not apply route as directed.   . Continuous Blood Gluc Receiver (FREESTYLE LIBRE READER) DEVI 1 Units by Does not apply route as directed.   . Continuous Blood Gluc Sensor (FREESTYLE LIBRE 14 DAY SENSOR) MISC Use as instructed to check blood sugar daily   . dextromethorphan-guaiFENesin (TUSSIN DM COUGH + CHEST) 10-100 MG/5ML liquid Take 10 mLs by mouth every 4 (four) hours as needed for cough.   . docusate sodium (COLACE) 100 MG capsule Take 100 mg by mouth daily as needed for mild constipation.    Marland Kitchen escitalopram (LEXAPRO) 10 MG tablet TAKE 1 TABLET EVERY DAY (Patient taking differently: Take 10 mg by mouth at bedtime. )   . fluticasone (FLONASE) 50 MCG/ACT nasal spray Place 2 sprays into both nostrils daily. (Patient taking differently: Place 2 sprays into both nostrils daily as needed for allergies. )   . glimepiride (AMARYL) 2 MG tablet TAKE 1 TABLET BY MOUTH DAILY WITH BREAKFAST   . glucose blood (ACCU-CHEK GUIDE) test strip Check sugars three times daily and as needed when feeling ill E11.65, insulin use   . Insulin Pen Needle 31G X 5 MM MISC USE TO INJECT INSULIN DAILY   . [place]  30G MISC Check sugars three times daily and as needed when feeling ill E11.65, insulin use   . LANTUS SOLOSTAR 100 UNIT/ML Solostar Pen INJECT 15 UNITS INTO THE SKIN DAILY AT 10 PM (Patient taking differently: Inject 20 Units into the skin at bedtime. )   . loratadine (CLARITIN) 10 MG tablet Take 10 mg by mouth daily.   Marland Kitchen lovastatin (MEVACOR) 20 MG tablet Take 1 tablet (20 mg total) by mouth once a week. 07/15/2019: Take on Sundays    . Multiple Vitamins-Minerals ([employer] SPECTRAVITE PO) Take 1 tablet by mouth at bedtime.   . Omega-3 Fatty Acids (FISH OIL PO) Take 1 tablet by mouth at bedtime.   Marland Kitchen omeprazole (PRILOSEC) 40 MG capsule TAKE 1 CAPSULE EVERY DAY (Patient taking differently: Take 40 mg by mouth daily. )   . tacrolimus (PROGRAF) 1 MG capsule Take 1-2 mg by mouth See admin instructions. Take 2 mg by mouth in the morning, then take 1 mg  by mouth in the evening    No facility-administered encounter medications on file as of 08/16/2019.   Current Diagnosis/Assessment: Goals    . Patient Stated     Starting 10/15/18, I will continue to take medications as prescribed.     . Pharmacy Care Plan     Current Barriers:  . Chronic Disease Management support, education, and care coordination needs related to hypertension, heart failure, orthostatic hypotension, allergic rhinitis, type 2 diabetes, dyslipidemia, gout, depression, alcohol cirrhosis/status post liver transplant, portal hypertensive gastropathy, neuropathy, CKD  Pharmacist Clinical Goal(s):  Marland Kitchen Achieve A1c goal of < 7% and prevent long-term adverse outcomes of elevated blood glucose. Check blood sugar every morning before breakfast for the following 7 days.  . Assess blood pressure following hospitalization. Check blood pressure every morning for the next 7 days. Keep a log of blood pressure readings.  . Resume losartan 50 mg daily and discontinue if any blood pressure readings less than 90/60 mmHg, dizziness, or falls. . Remain up to date on vaccinations. Recommend 2 doses of the Shingrix vaccine.  . Follow up telephone call in 1 week to assess blood pressure and blood glucose.  Interventions: . Comprehensive medication review performed. Marland Kitchen Resume losartan 50 mg daily  Patient Self Care Activities:  . Uses pillbox and takes medications as prescribed  . Self-monitors blood pressure  Initial goal documentation      Diabetes   Recent Relevant Labs: Lab  Results  Component Value Date/Time   HGBA1C 10.1 (H) 07/18/2019 11:33 AM   HGBA1C 7.7 (A) 04/23/2019 08:12 AM   HGBA1C 9.7 (A) 01/18/2019 08:31 AM   HGBA1C 7.8 (H) 10/15/2018 09:29 AM   MICROALBUR <0.7 08/27/2014 09:51 AM   A1c goal < 7%  Fasting: 80-130 After meals: < 180  Assessment:   Recent hospitalization: Last A1c high, however, patient had COVID and was hospitalized for a portion of that time.   Education: Patient needs further education on blood glucose goals and diet.   Diet: has a hard time limiting desserts, cupcakes, cakes, candy   Checking BG: none (FreeStyle Libre started in hospital, patient did not pick up from pharmacy due to cost -$82)  Patient reports he doesn't remember to check his BG, but is willing. He has not checked his sugar since discharge from hospital. Reports some blurred vision.  Patient has failed these meds in past: did not tolerate Ozempic Patient is currently uncontrolled on the following medications:   Glimepiride 2 mg - 1 tablet daily with breakfast  Lantus - Inject 15 units daily at night (reduced to 15 qHs during hospital stay)   Last diabetic eye exam:  Lab Results  Component Value Date/Time   HMDIABEYEEXA No Retinopathy 07/13/2017 12:00 AM    Last diabetic foot exam: unknown   Plan: Continue current medications. Begin checking fasting blood sugar every morning prior to breakfast.   Heart Failure  Last seen by cardiology 2018, Ottie Glazier   Type: Diastolic Last ejection fraction: 07/31/19 65-70% NYHA Class: unable to assess AHA HF Stage: unable to assess  Patient has failed these meds in past: none Patient is currently controlled on the following medications:   No pharmacotherapy; furosemide discontinued during hospital stay  We discussed: denies any swelling or shortness of breath  Plan: Instructed patient to resume furosemide at previous dose if any swelling occurs. Follow up with PCP next week.  Hypertension    Office blood pressures are  BP Readings from Last 3 Encounters:  08/02/19 130/85  07/23/19 125/77  07/17/19 102/63   CMP Latest Ref Rng & Units 08/02/2019 08/01/2019 07/31/2019  Glucose 70 - 99 mg/dL 184(H) 227(H) 277(H)  BUN 6 - 20 mg/dL 16 22(H) 32(H)  Creatinine 0.61 - 1.24 mg/dL 1.18 1.21 1.60(H)  Sodium 135 - 145 mmol/L 138 136 134(L)  Potassium 3.5 - 5.1 mmol/L 3.8 3.4(L) 3.7  Chloride 98 - 111 mmol/L 107 103 103  CO2 22 - 32 mmol/L '24 23 22  '$ Calcium 8.9 - 10.3 mg/dL 8.4(L) 8.5(L) 8.1(L)  Total Protein 6.5 - 8.1 g/dL - - 5.8(L)  Total Bilirubin 0.3 - 1.2 mg/dL - - 0.9  Alkaline Phos 38 - 126 U/L - - 70  AST 15 - 41 U/L - - 14(L)  ALT 0 - 44 U/L - - 22   Patient has failed these meds in the past: none reported Patient checks BP at home: checked last night for the first time since hospital discharge Patient home BP readings are ranging: 144/85 mmHg  Patient is currently uncontrolled on the following medications:   No pharmacotherapy (losartan, furosemide, doxazosin, and amlodipine discontinue 02/21 during hospital stay)  We discussed: denies history of hypotension prior to recent hospitalization  Plan: Resume losartan 50 mg daily as prescribed. Check blood pressure every morning for the next week.  Hyperlipidemia/CAD  Last seen by cardiology 2018, Katrina Nelson   Lipid Panel     Component Value Date/Time   CHOL 92 10/15/2018 0929   TRIG 128 07/18/2019 1137   HDL 26.10 (L) 10/15/2018 0929   CHOLHDL 4 10/15/2018 0929   VLDL 59.6 (H) 10/15/2018 0929   LDLCALC 44 10/06/2017 1017   LDLDIRECT 43.0 10/15/2018 0929   CBC Latest Ref Rng & Units 08/02/2019 08/01/2019 07/31/2019  WBC 4.0 - 10.5 K/uL 4.8 5.2 8.1  Hemoglobin 13.0 - 17.0 g/dL 10.9(L) 11.1(L) 12.1(L)  Hematocrit 39.0 - 52.0 % 32.3(L) 33.4(L) 36.3(L)  Platelets 150 - 400 K/uL 129(L) 128(L) 161    LDL goal < 100  Patient has failed these meds in past: none  Patient is currently controlled on the following medications:    Lovastatin 20 mg - once weekly (Sunday night)  Fish oil - 1 capsule at bedtime  Aspirin 81 mg - once daily  We discussed: patient keeps fish oil in the freezer due to smell, thus often forgets dose, takes about 4 days a week  Plan: Continue current medications  Gout  Uric acid (10/15/18): 7.5 Patient has failed these meds in past: none  Patient is  currently controlled on the following medications:   Allopurinol 300 mg - 1 tablet daily  We discussed: denies recent flares  Plan: Continue current medications   Depression   Patient has failed these meds in past: none Patient is currently controlled on the following medications:  Escitalopram 10 mg - 1 tablet daily at bedtime  We discussed: depressed mood has been worse with the winter/cold weather, lost his mom around this time of year, hasn't been well since last 1/16 due to COVID-19, first broken bone, denies med changes for now, max dose for liver transplant  Plan: Continue current medications  Allergic Rhinitis   Patient has failed these meds in past: none  Patient is currently controlled on the following medications:   Flonase 50 mcg/act - 2 sprays in each nostril daily   We discussed: uses every night, works well  Plan: Continue current medications  GERD   Patient has failed these meds in past: none Patient is currently controlled on the following medications:   Omeprazole 40 mg - 1 capsule daily  We discussed: denies complaints  Plan: Continue current medications  Status Post Liver Transplant   Patient is currently controlled on the following medications:   Tacrolimus 1 mg - take 2 tablets in the morning and 1 tablet in the evening   We discussed: Followed with bloodwork every other month, transplant team in [place], Dr. Reather Laurence Zamor; dose adjusted in past 6 months   Plan: Continue current medications  Medication Management  Misc: Albuterol inhaler - not taking, did not pick up after hospital  discharge, not taking cough syrup  OTCs: Tylenol, Vitamin D 2000 IU daily, Multivitamin ([facility] brand), docusate 100 mg BID, MiraLAX PRN   Pharmacy: [facility]  Adherence: Sunday night fills pillbox   Vaccines: Tdap, Influenza, PPSV23, HepA/B up to date; Recommend Shingrix   CCM Follow Up:  1 week (08/23/19 at 10:00 AM telephone)   Debbora Dus, PharmD Clinical Pharmacist [facility] [phone]

## 2019-08-16 ENCOUNTER — Other Ambulatory Visit: Payer: Self-pay

## 2019-08-16 ENCOUNTER — Ambulatory Visit: Payer: Medicare HMO

## 2019-08-16 DIAGNOSIS — N183 Chronic kidney disease, stage 3 unspecified: Secondary | ICD-10-CM

## 2019-08-16 DIAGNOSIS — J302 Other seasonal allergic rhinitis: Secondary | ICD-10-CM

## 2019-08-16 DIAGNOSIS — F321 Major depressive disorder, single episode, moderate: Secondary | ICD-10-CM

## 2019-08-16 DIAGNOSIS — I10 Essential (primary) hypertension: Secondary | ICD-10-CM

## 2019-08-16 DIAGNOSIS — M1A0790 Idiopathic chronic gout, unspecified ankle and foot, without tophus (tophi): Secondary | ICD-10-CM

## 2019-08-16 DIAGNOSIS — I951 Orthostatic hypotension: Secondary | ICD-10-CM

## 2019-08-16 DIAGNOSIS — E1169 Type 2 diabetes mellitus with other specified complication: Secondary | ICD-10-CM

## 2019-08-16 DIAGNOSIS — K219 Gastro-esophageal reflux disease without esophagitis: Secondary | ICD-10-CM

## 2019-08-16 DIAGNOSIS — G4700 Insomnia, unspecified: Secondary | ICD-10-CM

## 2019-08-16 DIAGNOSIS — I5032 Chronic diastolic (congestive) heart failure: Secondary | ICD-10-CM

## 2019-08-16 NOTE — Patient Instructions (Signed)
August 16, 2019  Dear Colton Porter,  It was a pleasure meeting you during our initial appointment on August 16, 2019. Below is a summary of the goals we discussed and components of chronic care management. Please contact me anytime with questions or concerns.   Visit Information  Goals Addressed            This Visit's Progress   . Pharmacy Care Plan       Current Barriers:  . Chronic Disease Management support, education, and care coordination needs related to hypertension, heart failure, orthostatic hypotension, allergic rhinitis, type 2 diabetes, dyslipidemia, gout, depression, alcohol cirrhosis/status post liver transplant, portal hypertensive gastropathy, neuropathy, CKD  Pharmacist Clinical Goal(s):  Marland Kitchen Achieve A1c goal of < 7% and prevent long-term adverse outcomes of elevated blood glucose. Check blood sugar every morning before breakfast for the following 7 days.  . Assess blood pressure following hospitalization. Check blood pressure every morning for the next 7 days. Keep a log of blood pressure readings.  . Resume losartan 50 mg daily and discontinue if any blood pressure readings less than 90/60 mmHg, dizziness, or falls. . Remain up to date on vaccinations. Recommend 2 doses of the Shingrix vaccine.  . Follow up telephone call in 1 week to assess blood pressure and blood glucose.  Interventions: . Comprehensive medication review performed. Marland Kitchen Resume losartan 50 mg daily  Patient Self Care Activities:  . Uses pillbox and takes medications as prescribed  . Self-monitors blood pressure  Initial goal documentation       [name] was given information about Chronic Care Management services today including:  1. CCM service includes personalized support from designated clinical staff supervised by his physician, including individualized plan of care and coordination with other care providers 2. 24/7 contact phone numbers for assistance for urgent and routine  care needs. 3. Service will only be billed when office clinical staff spend 20 minutes or more in a month to coordinate care. 4. Only one practitioner may furnish and bill the service in a calendar month. 5. The patient may stop CCM services at any time (effective at the end of the month) by phone call to the office staff. 6. The patient will be responsible for cost sharing (co-pay) of up to 20% of the service fee (after annual deductible is met).  Patient agreed to services and verbal consent obtained.   Telephone follow up appointment with pharmacy team member scheduled for: Friday, 08/23/19 at 10:00 AM   Debbora Dus, PharmD Clinical Pharmacist [facility] [phone]  [name] stands for "Dietary Approaches to Stop Hypertension." The DASH eating plan is a healthy eating plan that has been shown to reduce high blood pressure (hypertension). It may also reduce your risk for type 2 diabetes, heart disease, and stroke. The DASH eating plan may also help with weight loss. What are tips for following this plan?  General guidelines  Avoid eating more than 2,300 mg (milligrams) of salt (sodium) a day. If you have hypertension, you may need to reduce your sodium intake to 1,500 mg a day.  Limit alcohol intake to no more than 1 drink a day for nonpregnant women and 2 drinks a day for men. One drink equals 12 oz of beer, 5 oz of wine, or 1 oz of hard liquor.  Work with your health care provider to maintain a healthy body weight or to lose weight. Ask what an ideal weight is for you.  Get at least 30 minutes of exercise that causes your heart to beat faster (aerobic exercise) most days of the week. Activities may include walking, swimming, or biking.  Work with your health care provider or diet and nutrition specialist (dietitian) to adjust your eating plan to your individual calorie needs. Reading food labels   Check food labels for the amount of  sodium per serving. Choose foods with less than 5 percent of the Daily Value of sodium. Generally, foods with less than 300 mg of sodium per serving fit into this eating plan.  To find whole grains, look for the word "whole" as the first word in the ingredient list. Shopping  Buy products labeled as "low-sodium" or "no salt added."  Buy fresh foods. Avoid canned foods and premade or frozen meals. Cooking  Avoid adding salt when cooking. Use salt-free seasonings or herbs instead of table salt or sea salt. Check with your health care provider or pharmacist before using salt substitutes.  Do not fry foods. Cook foods using healthy methods such as baking, boiling, grilling, and broiling instead.  Cook with heart-healthy oils, such as olive, canola, soybean, or sunflower oil. Meal planning  Eat a balanced diet that includes: ? 5 or more servings of fruits and vegetables each day. At each meal, try to fill half of your plate with fruits and vegetables. ? Up to 6-8 servings of whole grains each day. ? Less than 6 oz of lean meat, poultry, or fish each day. A 3-oz serving of meat is about the same size as a deck of cards. One egg equals 1 oz. ? 2 servings of low-fat dairy each day. ? A serving of nuts, seeds, or beans 5 times each week. ? Heart-healthy fats. Healthy fats called Omega-3 fatty acids are found in foods such as flaxseeds and coldwater fish, like sardines, salmon, and mackerel.  Limit how much you eat of the following: ? Canned or prepackaged foods. ? Food that is high in trans fat, such as fried foods. ? Food that is high in saturated fat, such as fatty meat. ? Sweets, desserts, sugary drinks, and other foods with added sugar. ? Full-fat dairy products.  Do not salt foods before eating.  Try to eat at least 2 vegetarian meals each week.  Eat more home-cooked food and less restaurant, buffet, and fast food.  When eating at a restaurant, ask that your food be prepared with  less salt or no salt, if possible. What foods are recommended? The items listed may not be a complete list. Talk with your dietitian about what dietary choices are best for you. Grains Whole-grain or whole-wheat bread. Whole-grain or whole-wheat pasta. Brown rice. Modena Morrow. Bulgur. Whole-grain and low-sodium cereals. Pita bread. Low-fat, low-sodium crackers. Whole-wheat flour tortillas. Vegetables Fresh or frozen vegetables (raw, steamed, roasted, or grilled). Low-sodium or reduced-sodium tomato and vegetable juice. Low-sodium or reduced-sodium tomato sauce and tomato paste. Low-sodium or reduced-sodium canned vegetables. Fruits All fresh, dried, or frozen fruit. Canned fruit in natural juice (without added sugar). Meat and other protein foods Skinless chicken or [place]. Ground chicken or [place]. Pork with fat trimmed off. Fish and seafood. Egg whites. Dried beans, peas, or lentils. Unsalted nuts, nut butters, and seeds. Unsalted canned beans. Lean cuts of beef with fat trimmed off. Low-sodium, lean deli meat. Dairy Low-fat (1%) or fat-free (skim) milk. Fat-free, low-fat, or reduced-fat cheeses. Nonfat, low-sodium ricotta or cottage cheese. Low-fat or nonfat yogurt. Low-fat, low-sodium cheese. Fats and oils Soft margarine without  trans fats. Vegetable oil. Low-fat, reduced-fat, or light mayonnaise and salad dressings (reduced-sodium). Canola, safflower, olive, soybean, and sunflower oils. Avocado. Seasoning and other foods Herbs. Spices. Seasoning mixes without salt. Unsalted popcorn and pretzels. Fat-free sweets. What foods are not recommended? The items listed may not be a complete list. Talk with your dietitian about what dietary choices are best for you. Grains Baked goods made with fat, such as croissants, muffins, or some breads. Dry pasta or rice meal packs. Vegetables Creamed or fried vegetables. Vegetables in a cheese sauce. Regular canned vegetables (not low-sodium or  reduced-sodium). Regular canned tomato sauce and paste (not low-sodium or reduced-sodium). Regular tomato and vegetable juice (not low-sodium or reduced-sodium). Angie Fava. Olives. Fruits Canned fruit in a light or heavy syrup. Fried fruit. Fruit in cream or butter sauce. Meat and other protein foods Fatty cuts of meat. Ribs. Fried meat. Berniece Salines. Sausage. Bologna and other processed lunch meats. Salami. Fatback. Hotdogs. Bratwurst. Salted nuts and seeds. Canned beans with added salt. Canned or smoked fish. Whole eggs or egg yolks. Chicken or [place] with skin. Dairy Whole or 2% milk, cream, and half-and-half. Whole or full-fat cream cheese. Whole-fat or sweetened yogurt. Full-fat cheese. Nondairy creamers. Whipped toppings. Processed cheese and cheese spreads. Fats and oils Butter. Stick margarine. Lard. Shortening. Ghee. Bacon fat. Tropical oils, such as coconut, palm kernel, or palm oil. Seasoning and other foods Salted popcorn and pretzels. Onion salt, garlic salt, seasoned salt, table salt, and sea salt. Worcestershire sauce. Tartar sauce. Barbecue sauce. Teriyaki sauce. Soy sauce, including reduced-sodium. Steak sauce. Canned and packaged gravies. Fish sauce. Oyster sauce. Cocktail sauce. Horseradish that you find on the shelf. Ketchup. Mustard. Meat flavorings and tenderizers. Bouillon cubes. Hot sauce and Tabasco sauce. Premade or packaged marinades. Premade or packaged taco seasonings. Relishes. Regular salad dressings. Where to find more information:  National Heart, Lung, and [place]: https://wilson-eaton.com/  American Heart Association: www.heart.org Summary  The DASH eating plan is a healthy eating plan that has been shown to reduce high blood pressure (hypertension). It may also reduce your risk for type 2 diabetes, heart disease, and stroke.  With the DASH eating plan, you should limit salt (sodium) intake to 2,300 mg a day. If you have hypertension, you may need to reduce your sodium  intake to 1,500 mg a day.  When on the DASH eating plan, aim to eat more fresh fruits and vegetables, whole grains, lean proteins, low-fat dairy, and heart-healthy fats.  Work with your health care provider or diet and nutrition specialist (dietitian) to adjust your eating plan to your individual calorie needs. This information is not intended to replace advice given to you by your health care provider. Make sure you discuss any questions you have with your health care provider. Document Revised: 05/26/2017 Document Reviewed: 06/06/2016 Elsevier Patient Education  2020 [employer].

## 2019-08-16 NOTE — Patient Outreach (Signed)
[facility]) Care Management  08/16/2019  DAIMEN BOVEN Aug 29, 1965 [id number]   Transition of Care Referral  Referral Date:08/02/2019 Referral [place] Date of Admission:07/30/2019 Diagnosis:"Syncope" Date of Discharge:08/02/2019 Facility:[facility] Insurance:[employer] Medicare    Multiple attempts to establish contact with patient without success. No response from letter mailed to patient. Case is being closed at this time.     Plan: RN CM will close case at this time.   Enzo Montgomery, RN,BSN,CCM [place] Management Telephonic Care Management Coordinator Direct Phone: [phone] Toll Free: [phone] Fax: [phone]

## 2019-08-20 ENCOUNTER — Ambulatory Visit (INDEPENDENT_AMBULATORY_CARE_PROVIDER_SITE_OTHER): Payer: Medicare HMO | Admitting: Family Medicine

## 2019-08-20 ENCOUNTER — Other Ambulatory Visit: Payer: Self-pay

## 2019-08-20 ENCOUNTER — Encounter: Payer: Self-pay | Admitting: Family Medicine

## 2019-08-20 ENCOUNTER — Telehealth: Payer: Self-pay

## 2019-08-20 VITALS — BP 132/78 | HR 95 | Temp 97.9°F | Ht 66.0 in | Wt 257.2 lb

## 2019-08-20 DIAGNOSIS — J9601 Acute respiratory failure with hypoxia: Secondary | ICD-10-CM

## 2019-08-20 DIAGNOSIS — K7030 Alcoholic cirrhosis of liver without ascites: Secondary | ICD-10-CM

## 2019-08-20 DIAGNOSIS — Z794 Long term (current) use of insulin: Secondary | ICD-10-CM

## 2019-08-20 DIAGNOSIS — U071 COVID-19: Secondary | ICD-10-CM

## 2019-08-20 DIAGNOSIS — Z944 Liver transplant status: Secondary | ICD-10-CM | POA: Diagnosis not present

## 2019-08-20 DIAGNOSIS — I951 Orthostatic hypotension: Secondary | ICD-10-CM

## 2019-08-20 DIAGNOSIS — N401 Enlarged prostate with lower urinary tract symptoms: Secondary | ICD-10-CM | POA: Diagnosis not present

## 2019-08-20 DIAGNOSIS — E1169 Type 2 diabetes mellitus with other specified complication: Secondary | ICD-10-CM | POA: Diagnosis not present

## 2019-08-20 DIAGNOSIS — E440 Moderate protein-calorie malnutrition: Secondary | ICD-10-CM

## 2019-08-20 DIAGNOSIS — I10 Essential (primary) hypertension: Secondary | ICD-10-CM

## 2019-08-20 DIAGNOSIS — K0889 Other specified disorders of teeth and supporting structures: Secondary | ICD-10-CM | POA: Diagnosis not present

## 2019-08-20 DIAGNOSIS — Z6841 Body Mass Index (BMI) 40.0 and over, adult: Secondary | ICD-10-CM

## 2019-08-20 DIAGNOSIS — R351 Nocturia: Secondary | ICD-10-CM

## 2019-08-20 DIAGNOSIS — J1282 Pneumonia due to coronavirus disease 2019: Secondary | ICD-10-CM

## 2019-08-20 MED ORDER — FREESTYLE LIBRE 14 DAY READER DEVI: 1.0000 [IU] | 1 refills | Status: DC

## 2019-08-20 MED ORDER — FREESTYLE LIBRE 14 DAY SENSOR MISC: 2 refills | Status: DC

## 2019-08-20 MED ORDER — FREESTYLE LIBRE READER DEVI: 1.0000 [IU] | 0 refills | Status: DC

## 2019-08-20 MED ORDER — AMOXICILLIN 875 MG PO TABS: 875.0000 mg | ORAL_TABLET | Freq: Two times a day (BID) | ORAL | 0 refills | Status: DC

## 2019-08-20 NOTE — Addendum Note (Signed)
Addended by: Ria Bush on: 08/20/2019 05:00 PM   Modules accepted: Orders

## 2019-08-20 NOTE — Patient Instructions (Addendum)
We will ask home health ([facility]) to take oxygen tanks back.  Call to schedule dentist appointment.  Start amoxicillin 1 week course for tooth.  [facility] continuous glucose monitor.  Stay off blood pressure medicines for now, continue watching BP at home and let me know if running consistently >140/90.  Good to see you today. Bring me sugar log prior to next visit.  Schedule 2-3 month follow up visit for diabetes check.

## 2019-08-20 NOTE — Progress Notes (Signed)
This visit was conducted in person.  BP 132/78 (BP Location: Left Arm, Patient Position: Sitting, Cuff Size: Large)   Pulse 95   Temp 97.9 F (36.6 C) (Temporal)   Ht '5\' 6"'$  (1.676 m)   Wt 257 lb 3 oz (116.7 kg)   SpO2 93%   BMI 41.51 kg/m    CC: in-person hosp f/u visit Subjective:    Patient ID: Colton Porter, male    DOB: 1966/02/12, 55 y.o.   MRN: [id number]  HPI: Colton Porter is a 54 y.o. male presenting on 08/20/2019 for Hospitalization Follow-up   See prior note for details.  First CCM phone call with Sharyn Lull last week, reviewed.   Several week h/o R jaw pain with radiation up to ear. Taking '3000mg'$  tylenol daily for this. Last saw dentist over a year ago. This leads to sinus headcahe.   DM - freestyle libre CGM was unaffordable. Trouble remembering to check sugars. Lantus increased to 20u daily. Has not been checking sugars. Thinks he will try CGM despite cost.  Lab Results  Component Value Date   HGBA1C 10.1 (H) 07/18/2019    Recent hospitalization with covid pneumonia (06/2019).  Passes ambulatory pulse ox today.  No longer needs oxygen.   Latest hospitalization 07/30/2019 for syncope associated with hypotension - BP meds were held. He is currently off amlodipine, doxazosin and furosemide. Home BP yesterday 138/75   25lb weight loss with Covid illness.  Admit date: 07/30/2019 Discharge date: 08/02/2019 TCM hosp f/u phone call completed 08/02/2019  DISCHARGE DIAGNOSES:  Orthostatic hypotension with syncope, improved Pneumonia due to COVID-19 Acute renal failure, resolved History of renal transplant ===> mistake - should be liver transplant Diabetes mellitus type 2 uncontrolled with hyperglycemia  RECOMMENDATIONS FOR OUTPATIENT FOLLOW UP: 1. Patient is antihypertensives as well as diuretics being placed on hold.  PCP to please determine timing of resumption of these medications.  Home Health: None Equipment/Devices: He has oxygen at home  CODE  STATUS: Full code  DISCHARGE CONDITION: fair  Diet recommendation: Modified carbohydrate     Relevant past medical, surgical, family and social history reviewed and updated as indicated. Interim medical history since our last visit reviewed. Allergies and medications reviewed and updated. Outpatient Medications Prior to Visit  Medication Sig Dispense Refill  . acetaminophen (TYLENOL) 500 MG tablet Take 500 mg by mouth every 6 (six) hours as needed for mild pain.    Marland Kitchen albuterol (VENTOLIN HFA) 108 (90 Base) MCG/ACT inhaler Inhale 2 puffs into the lungs every 6 (six) hours as needed for wheezing or shortness of breath. 8 g 0  . allopurinol (ZYLOPRIM) 300 MG tablet Take 300 mg by mouth daily.     . ASPIR-LOW 81 MG EC tablet Take 1 tablet (81 mg total) by mouth daily. 90 tablet 3  . Blood Glucose Monitoring Suppl (ACCU-CHEK GUIDE) w/Device KIT 1 Units by Does not apply route as directed. 1 kit 0  . Cholecalciferol (VITAMIN D3) 2000 units TABS Take 1 tablet by mouth at bedtime.    Marland Kitchen dextromethorphan-guaiFENesin (TUSSIN DM COUGH + CHEST) 10-100 MG/5ML liquid Take 10 mLs by mouth every 4 (four) hours as needed for cough.    . docusate sodium (COLACE) 100 MG capsule Take 100 mg by mouth daily as needed for mild constipation.     Marland Kitchen escitalopram (LEXAPRO) 10 MG tablet TAKE 1 TABLET EVERY DAY (Patient taking differently: Take 10 mg by mouth at bedtime. ) 90 tablet 1  . fluticasone (FLONASE) 50  MCG/ACT nasal spray Place 2 sprays into both nostrils daily. (Patient taking differently: Place 2 sprays into both nostrils daily as needed for allergies. ) 16 g 6  . furosemide (LASIX) 40 MG tablet Take 40 mg by mouth. Takes as needed for swelling    . glimepiride (AMARYL) 2 MG tablet TAKE 1 TABLET BY MOUTH DAILY WITH BREAKFAST 90 tablet 0  . glucose blood (ACCU-CHEK GUIDE) test strip Check sugars three times daily and as needed when feeling ill E11.65, insulin use 100 each 11  . Insulin Pen Needle 31G X 5 MM  MISC USE TO INJECT INSULIN DAILY 100 each 3  . LANCETS ULTRA THIN 30G MISC Check sugars three times daily and as needed when feeling ill E11.65, insulin use 100 each 11  . LANTUS SOLOSTAR 100 UNIT/ML Solostar Pen INJECT 15 UNITS INTO THE SKIN DAILY AT 10 PM (Patient taking differently: Inject 20 Units into the skin at bedtime. ) 15 mL 3  . loratadine (CLARITIN) 10 MG tablet Take 10 mg by mouth daily.    Marland Kitchen losartan (COZAAR) 50 MG tablet Take 50 mg by mouth daily.    Marland Kitchen lovastatin (MEVACOR) 20 MG tablet Take 1 tablet (20 mg total) by mouth once a week. 13 tablet 3  . Multiple Vitamins-Minerals ([facility] SPECTRAVITE PO) Take 1 tablet by mouth at bedtime.    . Omega-3 Fatty Acids (FISH OIL PO) Take 1 tablet by mouth at bedtime.    Marland Kitchen omeprazole (PRILOSEC) 40 MG capsule TAKE 1 CAPSULE EVERY DAY (Patient taking differently: Take 40 mg by mouth daily. ) 90 capsule 1  . tacrolimus (PROGRAF) 1 MG capsule Take 1-2 mg by mouth See admin instructions. Take 2 mg by mouth in the morning, then take 1 mg  by mouth in the evening    . Continuous Blood Gluc Receiver (FREESTYLE LIBRE 14 DAY READER) DEVI 1 Units by Does not apply route as directed. 1 each 1  . Continuous Blood Gluc Receiver (FREESTYLE LIBRE READER) DEVI 1 Units by Does not apply route as directed. 1 each 0  . Continuous Blood Gluc Sensor (FREESTYLE LIBRE 14 DAY SENSOR) MISC Use as instructed to check blood sugar daily 2 each 2   No facility-administered medications prior to visit.     Per HPI unless specifically indicated in ROS section below Review of Systems Objective:    BP 132/78 (BP Location: Left Arm, Patient Position: Sitting, Cuff Size: Large)   Pulse 95   Temp 97.9 F (36.6 C) (Temporal)   Ht '5\' 6"'$  (1.676 m)   Wt 257 lb 3 oz (116.7 kg)   SpO2 93%   BMI 41.51 kg/m   Wt Readings from Last 3 Encounters:  08/20/19 257 lb 3 oz (116.7 kg)  08/01/19 274 lb 12.2 oz (124.6 kg)  07/18/19 280 lb (127 kg)    Physical Exam Vitals and nursing  note reviewed.  Constitutional:      General: He is not in acute distress.    Appearance: Normal appearance. He is well-developed. He is not ill-appearing.  HENT:     Head: Normocephalic and atraumatic.     Right Ear: Hearing, tympanic membrane, ear canal and external ear normal.     Left Ear: Hearing, tympanic membrane, ear canal and external ear normal.     Nose: Rhinorrhea present. No mucosal edema or congestion.     Right Sinus: No maxillary sinus tenderness or frontal sinus tenderness.     Left Sinus: No maxillary sinus tenderness  or frontal sinus tenderness.     Mouth/Throat:     Mouth: Mucous membranes are moist.     Dentition: Abnormal dentition. Dental tenderness present.     Pharynx: Oropharynx is clear. Uvula midline. No oropharyngeal exudate or posterior oropharyngeal erythema.     Tonsils: No tonsillar exudate or tonsillar abscesses.     Comments:  Tender right upper teeth to palpation Several removed No obvious abscess Eyes:     General: No scleral icterus.    Extraocular Movements: Extraocular movements intact.     Conjunctiva/sclera: Conjunctivae normal.     Pupils: Pupils are equal, round, and reactive to light.  Cardiovascular:     Rate and Rhythm: Regular rhythm. Tachycardia present.     Pulses: Normal pulses.     Heart sounds: Normal heart sounds. No murmur.  Pulmonary:     Effort: Pulmonary effort is normal. No respiratory distress.     Breath sounds: Normal breath sounds. No wheezing, rhonchi or rales.  Musculoskeletal:     Cervical back: Normal range of motion and neck supple.     Right lower leg: No edema.     Left lower leg: No edema.  Lymphadenopathy:     Cervical: No cervical adenopathy.  Skin:    General: Skin is warm and dry.     Findings: No rash.  Neurological:     Mental Status: He is alert.  Psychiatric:        Mood and Affect: Mood normal.        Behavior: Behavior normal.       Lab Results  Component Value Date   CREATININE 1.18  08/02/2019   BUN 16 08/02/2019   NA 138 08/02/2019   K 3.8 08/02/2019   CL 107 08/02/2019   CO2 24 08/02/2019    Lab Results  Component Value Date   ALT 22 07/31/2019   AST 14 (L) 07/31/2019   GGT 45 10/26/2015   ALKPHOS 70 07/31/2019   BILITOT 0.9 07/31/2019    Assessment & Plan:  This visit occurred during the SARS-CoV-2 public health emergency.  Safety protocols were in place, including screening questions prior to the visit, additional usage of staff PPE, and extensive cleaning of exam room while observing appropriate contact time as indicated for disinfecting solutions.   Problem List Items Addressed This Visit    Type 2 diabetes mellitus with other specified complication ([place]) (Chronic)    Chronic, unsure control as he's not been checking sugars - he is interested in CGM so this was resent to pharmacy. Will continue insulin and sulfonylurea for now. I did ask him to bring in sugar log prior to next appointment to titrate insulin accordingly.       Relevant Medications   losartan (COZAAR) 50 MG tablet   Continuous Blood Gluc Sensor (FREESTYLE LIBRE 14 DAY SENSOR) MISC   Status post liver transplant ([place]) (Chronic)   Protein-calorie malnutrition (HCC)    Appetite has been improving.       Pneumonia due to COVID-19 virus - Primary    Seems largely resolved.       Relevant Medications   amoxicillin (AMOXIL) 875 MG tablet   Pain, dental    Not consistent with sinusitis but rather tooth related. I did recommend he return to dentist. Will Rx amoxicillin course in interim.       Orthostatic hypotension    Remains off antihypertensives (amlodipine and doxazosin) - anticipate weight loss has helped. Continue to monitor.  Relevant Medications   furosemide (LASIX) 40 MG tablet   losartan (COZAAR) 50 MG tablet   Morbid obesity with BMI of 40.0-44.9, adult (HCC)    25 lb weight loss since covid pneumonia. Anticipate better BP control with this. Hope for better glycemic  control as well.       Essential hypertension (Chronic)    Remains off doxazosin and amlodipine with adequate BP. Will keep lasix PRN pedal edema.       Relevant Medications   furosemide (LASIX) 40 MG tablet   losartan (COZAAR) 50 MG tablet   BPH (benign prostatic hyperplasia)    Stable period off doxazosin - will remain off with recent hypotensive episodes.       Alcoholic cirrhosis of liver without ascites ([place])    S/p liver transplant.       Acute respiratory failure with hypoxia (HCC)    Passes ambulatory pulse ox today - hasn't been using home oxygen. Will ask DME supplier to pick up home oxygen.           Meds ordered this encounter  Medications  . Continuous Blood Gluc Receiver (FREESTYLE LIBRE 14 DAY READER) DEVI    Sig: 1 Units by Does not apply route as directed.    Dispense:  1 each    Refill:  1  . Continuous Blood Gluc Receiver (FREESTYLE LIBRE READER) DEVI    Sig: 1 Units by Does not apply route as directed.    Dispense:  1 each    Refill:  0  . Continuous Blood Gluc Sensor (FREESTYLE LIBRE 14 DAY SENSOR) MISC    Sig: Use as instructed to check blood sugar daily    Dispense:  2 each    Refill:  2  . amoxicillin (AMOXIL) 875 MG tablet    Sig: Take 1 tablet (875 mg total) by mouth 2 (two) times daily.    Dispense:  14 tablet    Refill:  0   No orders of the defined types were placed in this encounter.   Patient Instructions  We will ask home health ([facility]) to take oxygen tanks back.  Call to schedule dentist appointment.  Start amoxicillin 1 week course for tooth.  [facility] continuous glucose monitor.  Stay off blood pressure medicines for now, continue watching BP at home and let me know if running consistently >140/90.  Good to see you today. Bring me sugar log prior to next visit.  Schedule 2-3 month follow up visit for diabetes check.    Follow up plan: Return in about 2 months (around 10/18/2019).  Ria Bush, MD

## 2019-08-20 NOTE — Telephone Encounter (Signed)
Order placed in epic and printed and placed in Lisa's box.

## 2019-08-20 NOTE — Telephone Encounter (Signed)
Spoke with Tiffany of [facility] to give verbal order, per Dr. Darnell Level, to d/c home O2.  Jonelle Sidle says they need a written order faxed to [phone].

## 2019-08-21 DIAGNOSIS — K0889 Other specified disorders of teeth and supporting structures: Secondary | ICD-10-CM | POA: Insufficient documentation

## 2019-08-21 NOTE — Assessment & Plan Note (Signed)
Not consistent with sinusitis but rather tooth related. I did recommend he return to dentist. Will Rx amoxicillin course in interim.

## 2019-08-21 NOTE — Assessment & Plan Note (Signed)
Passes ambulatory pulse ox today - hasn't been using home oxygen. Will ask DME supplier to pick up home oxygen.

## 2019-08-21 NOTE — Assessment & Plan Note (Signed)
Seems largely resolved.

## 2019-08-21 NOTE — Assessment & Plan Note (Signed)
Stable period off doxazosin - will remain off with recent hypotensive episodes.

## 2019-08-21 NOTE — Telephone Encounter (Signed)
Faxed orders

## 2019-08-21 NOTE — Assessment & Plan Note (Signed)
S/p liver transplant. 

## 2019-08-21 NOTE — Assessment & Plan Note (Signed)
25 lb weight loss since covid pneumonia. Anticipate better BP control with this. Hope for better glycemic control as well.

## 2019-08-21 NOTE — Assessment & Plan Note (Signed)
Remains off antihypertensives (amlodipine and doxazosin) - anticipate weight loss has helped. Continue to monitor.

## 2019-08-21 NOTE — Assessment & Plan Note (Signed)
Appetite has been improving.

## 2019-08-21 NOTE — Assessment & Plan Note (Signed)
Remains off doxazosin and amlodipine with adequate BP. Will keep lasix PRN pedal edema.

## 2019-08-21 NOTE — Assessment & Plan Note (Addendum)
Chronic, unsure control as he's not been checking sugars - he is interested in CGM so this was resent to pharmacy. Will continue insulin and sulfonylurea for now. I did ask him to bring in sugar log prior to next appointment to titrate insulin accordingly.

## 2019-08-23 ENCOUNTER — Ambulatory Visit: Payer: Medicare HMO

## 2019-08-23 ENCOUNTER — Other Ambulatory Visit: Payer: Self-pay

## 2019-08-23 DIAGNOSIS — E1169 Type 2 diabetes mellitus with other specified complication: Secondary | ICD-10-CM

## 2019-08-23 DIAGNOSIS — Z794 Long term (current) use of insulin: Secondary | ICD-10-CM

## 2019-08-23 DIAGNOSIS — I10 Essential (primary) hypertension: Secondary | ICD-10-CM

## 2019-08-23 NOTE — Chronic Care Management (AMB) (Signed)
   Chronic Care Management Pharmacy  Name: Colton Porter  MRN: [id number] DOB: [birth year]  Chief Complaint/ HPI  [name],  54 y.o. , male presents for their Follow-Up CCM visit with the clinical pharmacist via telephone for review of blood glucose and blood pressure logs.   PCP : Ria Bush, MD   Recent office visits:  PCP 08/20/19: patient remained interested in CGM so [facility] Patient went to pick it up and the price increased from $80/month to $130/month. Patient would like to check the price through [facility] mail order.    Diabetes   Recent Relevant Labs: Lab Results  Component Value Date/Time   HGBA1C 10.1 (H) 07/18/2019 11:33 AM   HGBA1C 7.7 (A) 04/23/2019 08:12 AM   HGBA1C 9.7 (A) 01/18/2019 08:31 AM   HGBA1C 7.8 (H) 10/15/2018 09:29 AM   MICROALBUR <0.7 08/27/2014 09:51 AM    Checking BG: Pt has a busy schedule and reports forgetting to routinely monitor his BG 2 hours after meals. I recommended checking daily before breakfast. He tried to check his BG today with glucometer, but batteries were dead and he is out of lancets Print production planner). He is still interested in CGM. Reports he was asked to call Dr. Darnell Level to let him know if he picked it up [facility] or wanted to get it through [facility] Contacted [employer] to compare prices to [facility] and the reader is $83, two 14-day sensors are $117 through mail order. Neither are covered so this is out of pocket cost. Pt confirmed he would not like to pay this price, so we will continue with current glucometer and send refill on lancets.   A1c goal < 7%  Fasting: 80-130 After meals: < 180  Patient has failed these meds in past: Ozempic Patient is currently uncontrolled (per A1c) on the following medications:   Glimepiride 2 mg - 1 tablet daily with breakfast  Lantus - Inject 15 units daily at night  Last diabetic eye exam:  Lab Results  Component Value Date/Time   HMDIABEYEEXA No Retinopathy 07/13/2017 12:00  AM    Last diabetic foot exam: unknown  Plan: Continue current medications. Pt will replace glucometer battery and call on Monday with weekend readings. Pharmacist will request refill on lancets and test strips.   Hypertension   Office blood pressures are  BP Readings from Last 3 Encounters:  08/20/19 132/78  08/02/19 130/85  07/23/19 125/77   BP goal < 140/90 mmHg Patient has failed these meds in the past: doxazosin and amlodipine discontinued 02/21 during hospital stay Patient checks BP at home daily Patient home BP readings are ranging:138-140/75 mmHg  Patient is currently controlled on the following medications:   Losartan 50 mg daily   Furosemide 40 mg - 1 daily as needed for swelling  We discussed: targeting higher BP goal due to h/o orthostatic hypotension and recent syncope  Plan: Continue current medications    Debbora Dus, PharmD Clinical Pharmacist [facility] at [facility] [phone]  ----------------------- Dr. [name],  Mr. Reischman has decided not to purchase the [facility] after we compared [facility] and [employer] order prices. He is also out of lancets and has about 30 test strips. Could we send prescription refills for his lancets and test strips? He has the Accu-Chek FastClix lancing device and meter.  Thank you,  Sharyn Lull

## 2019-08-25 MED ORDER — ACCU-CHEK FASTCLIX LANCETS MISC: 12 refills | Status: DC

## 2019-08-25 MED ORDER — GLUCOSE BLOOD VI STRP: ORAL_STRIP | 12 refills | Status: DC

## 2019-08-25 NOTE — Addendum Note (Signed)
Addended by: Ria Bush on: 08/25/2019 12:38 PM   Modules accepted: Orders

## 2019-08-25 NOTE — Progress Notes (Signed)
Diabetes supplies refilled.

## 2019-08-26 ENCOUNTER — Other Ambulatory Visit: Payer: Self-pay

## 2019-09-06 DIAGNOSIS — Z79899 Other long term (current) drug therapy: Secondary | ICD-10-CM | POA: Diagnosis not present

## 2019-09-06 DIAGNOSIS — Z48298 Encounter for aftercare following other organ transplant: Secondary | ICD-10-CM | POA: Diagnosis not present

## 2019-09-06 DIAGNOSIS — Z944 Liver transplant status: Secondary | ICD-10-CM | POA: Diagnosis not present

## 2019-09-10 ENCOUNTER — Other Ambulatory Visit: Payer: Self-pay

## 2019-09-13 ENCOUNTER — Ambulatory Visit: Payer: Medicare HMO

## 2019-09-13 ENCOUNTER — Telehealth: Payer: Self-pay

## 2019-09-13 ENCOUNTER — Other Ambulatory Visit: Payer: Self-pay

## 2019-09-13 DIAGNOSIS — E1169 Type 2 diabetes mellitus with other specified complication: Secondary | ICD-10-CM

## 2019-09-13 DIAGNOSIS — Z794 Long term (current) use of insulin: Secondary | ICD-10-CM

## 2019-09-13 DIAGNOSIS — I10 Essential (primary) hypertension: Secondary | ICD-10-CM

## 2019-09-13 NOTE — Chronic Care Management (AMB) (Signed)
Chronic Care Management Pharmacy  Name: Colton Porter  MRN: [id number] DOB: 1965-07-27  Chief Complaint/ HPI  [name],  54 y.o., male presents for their Follow-Up CCM visit with the clinical pharmacist via telephone for review of blood glucose and blood pressure logs.   PCP : Ria Bush, MD   No office visits since last CCM visit on 08/23/19  Diabetes   CMP Latest Ref Rng & Units 08/02/2019 08/01/2019 07/31/2019  Glucose 70 - 99 mg/dL 184(H) 227(H) 277(H)  BUN 6 - 20 mg/dL 16 22(H) 32(H)  Creatinine 0.61 - 1.24 mg/dL 1.18 1.21 1.60(H)  Sodium 135 - 145 mmol/L 138 136 134(L)  Potassium 3.5 - 5.1 mmol/L 3.8 3.4(L) 3.7  Chloride 98 - 111 mmol/L 107 103 103  CO2 22 - 32 mmol/L 24 23 22   Calcium 8.9 - 10.3 mg/dL 8.4(L) 8.5(L) 8.1(L)  Total Protein 6.5 - 8.1 g/dL - - 5.8(L)  Total Bilirubin 0.3 - 1.2 mg/dL - - 0.9  Alkaline Phos 38 - 126 U/L - - 70  AST 15 - 41 U/L - - 14(L)  ALT 0 - 44 U/L - - 22   Recent Relevant Labs: Lab Results  Component Value Date/Time   HGBA1C 10.1 (H) 07/18/2019 11:33 AM   HGBA1C 7.7 (A) 04/23/2019 08:12 AM   HGBA1C 9.7 (A) 01/18/2019 08:31 AM   HGBA1C 7.8 (H) 10/15/2018 09:29 AM   MICROALBUR <0.7 08/27/2014 09:51 AM   A1c goal < 7%  Fasting: 80-130 After meals: < 180   Checking BG: checked 3 days this week, before breakfast Picked up lancets and has plenty of supplies at home now for BG checks Fasting blood glucose: 310, 266, 270  Diet:   Breakfast: bacon, egg, cheese on toast or sausage biscuit   Lunch: sandwich, carrots with ranch   Dinner: Eats out on the weekends, baked or grilled chicken, corn, peas, green beans, instant potatoes; drinks only water or Powerade zero, occasional sweet tea  Snacks: Eating a lot of ice cream lately, has ice cream every night with TV, ate 6 cupcakes this week for birthday   Exercise: none formally, but stays active during work   Patient has failed these meds in past: Ozempic  (nausea/diarrhea), has not tried metformin but avoided due to renal function  Patient is currently uncontrolled (per A1c) on the following medications:   Glimepiride 2 mg - 1 tablet daily with breakfast  Lantus - Inject 18 units daily at night  (self- increased to 18 units qhs after hospital reduced his insulin to 15 units qhs)    Last diabetic eye exam:  Lab Results  Component Value Date/Time   HMDIABEYEEXA No Retinopathy 07/13/2017 12:00 AM    Last diabetic foot exam: unknown  Plan: Recommend increasing Lantus per PCP consult, will follow up with patient. Patient plans to work on reducing ice cream and finding healthier snack alternatives.   Hypertension   Office blood pressures are  BP Readings from Last 3 Encounters:  08/20/19 132/78  08/02/19 130/85  07/23/19 125/77   BP goal < 140/90 mmHg Patient has failed these meds in the past: doxazosin and amlodipine (discontinued 02/21 during hospital stay) Patient checks BP at home daily Patient home BP readings are ranging:138-140/75 mmHg  Patient is currently controlled on the following medications:   Losartan 50 mg daily   Furosemide 40 mg - 1 daily as needed for swelling  We discussed: doing well on losartan and furosemide PRN  Plan: Continue current  medications   CCM Follow Up: 09/30/19 at 3:00 PM (telephone)   Debbora Dus, PharmD Clinical Pharmacist [facility] at [facility] [phone]

## 2019-09-13 NOTE — Telephone Encounter (Signed)
Lab Results  Component Value Date   HGBA1C 10.1 (H) 07/18/2019    I thought he was on lantus 20 units (per my last note) Recommend he titrate lantus by 2 units every 3 days if average fasting sugar remains >150. Increase this way up to 30 units, then update [name] with readings.

## 2019-09-13 NOTE — Telephone Encounter (Signed)
Consult with PCP regarding CCM visit on 09/13/19: Type 2 DM  Mr. Easter is checking his fasting blood glucose 3 days per week. Reports the following recent readings before breakfast: 310, 266, 270  He admits his diet has been poor, eating ice cream every night and minimal exercise. He self-increased his Lantus from 15 units qhs to 18 units qhs about 3 weeks ago. We discussed likely need to increase Lantus and implement dietary changes.  Patient has failed these meds in past: Ozempic (nausea/diarrhea), has not tried metformin but avoided previously due to renal function.  Current medications:  Glimepiride 2 mg - 1 tablet daily with breakfast Lantus - Inject 18 units daily at night  Let me know how you would like to proceed and I will follow up with the patient today.  Thank you,  Debbora Dus, PharmD Clinical Pharmacist [facility] at [facility] [phone]

## 2019-09-13 NOTE — Patient Instructions (Addendum)
September 13, 2019  Dear [name],  Below is a summary of the goals we discussed during our telephone appointment on September 13, 2019. Please contact me anytime with questions or concerns.   Visit Information  Goals Addressed            This Visit's Progress   . Pharmacy Care Plan       Current Barriers:  . Chronic Disease Management support, education, and care coordination needs related to hypertension, heart failure, orthostatic hypotension, allergic rhinitis, type 2 diabetes, dyslipidemia, gout, depression, alcohol cirrhosis/status post liver transplant, portal hypertensive gastropathy, neuropathy, CKD  Pharmacist Clinical Goal(s):  Marland Kitchen Achieve A1c goal of < 7% and prevent long-term adverse outcomes of elevated blood glucose. Check blood sugar every morning before breakfast. Consult with PCP for insulin dose adjustment. I will reach out to regarding changes.  . Maintain blood pressure within goal of less than 140/90 mmHg. Check blood pressure in the morning before medications.  . Remain up to date on vaccinations. Recommend 2 doses of the Shingrix vaccine.   Interventions: . Comprehensive medication review performed.  Patient Self Care Activities:  . Uses pillbox and takes medications as prescribed  . Self-monitors blood pressure  Initial goal documentation       The patient verbalized understanding of instructions provided today and agreed to receive a mailed copy of patient instruction and/or educational materials. Telephone follow up appointment with pharmacy team member scheduled for: September 30, 2019 at 3:00 PM (telephone)  Debbora Dus, PharmD Clinical Pharmacist [facility] at [facility] [phone]

## 2019-09-16 ENCOUNTER — Other Ambulatory Visit: Payer: Self-pay | Admitting: Family Medicine

## 2019-09-16 NOTE — Telephone Encounter (Signed)
I will let him know about titration and follow up on BG readings. He was on Lantus 20 units prior to hospitalization on 07/30/19. He was sent him home on 15 units qhs.

## 2019-09-30 ENCOUNTER — Ambulatory Visit: Payer: Medicare HMO

## 2019-09-30 ENCOUNTER — Other Ambulatory Visit: Payer: Self-pay

## 2019-09-30 DIAGNOSIS — I10 Essential (primary) hypertension: Secondary | ICD-10-CM

## 2019-09-30 DIAGNOSIS — Z794 Long term (current) use of insulin: Secondary | ICD-10-CM

## 2019-09-30 DIAGNOSIS — E1169 Type 2 diabetes mellitus with other specified complication: Secondary | ICD-10-CM

## 2019-09-30 NOTE — Chronic Care Management (AMB) (Signed)
Chronic Care Management Pharmacy  Name: Colton Porter  MRN: [id number] DOB: May 20, 1966  Chief Complaint/ HPI  [name],  54 y.o., male presents for their Follow-Up CCM visit with the clinical pharmacist via telephone for review of blood glucose and blood pressure logs.   PCP : Ria Bush, MD   No office visits since last CCM visit on 09/13/19 Per PCP consult 09/13/19: titrate lantus by 2 units every 3 days if average fasting sugar remains >150. Increase this way up to 30 units, then update [name] with readings.   Current Outpatient Medications on File Prior to Visit  Medication Sig Dispense Refill  . Accu-Chek FastClix Lancets MISC Use three times daily to check sugars E65.11, insulin use 100 each 12  . acetaminophen (TYLENOL) 500 MG tablet Take 500 mg by mouth every 6 (six) hours as needed for mild pain.    Marland Kitchen albuterol (VENTOLIN HFA) 108 (90 Base) MCG/ACT inhaler Inhale 2 puffs into the lungs every 6 (six) hours as needed for wheezing or shortness of breath. 8 g 0  . allopurinol (ZYLOPRIM) 300 MG tablet Take 300 mg by mouth daily.     Marland Kitchen amoxicillin (AMOXIL) 875 MG tablet Take 1 tablet (875 mg total) by mouth 2 (two) times daily. 14 tablet 0  . ASPIR-LOW 81 MG EC tablet Take 1 tablet (81 mg total) by mouth daily. 90 tablet 3  . Blood Glucose Monitoring Suppl (ACCU-CHEK GUIDE) w/Device KIT 1 Units by Does not apply route as directed. 1 kit 0  . Cholecalciferol (VITAMIN D3) 2000 units TABS Take 1 tablet by mouth at bedtime.    . Continuous Blood Gluc Receiver (FREESTYLE LIBRE 14 DAY READER) DEVI 1 Units by Does not apply route as directed. 1 each 1  . Continuous Blood Gluc Receiver (FREESTYLE LIBRE READER) DEVI 1 Units by Does not apply route as directed. 1 each 0  . Continuous Blood Gluc Sensor (FREESTYLE LIBRE 14 DAY SENSOR) MISC Use as instructed to check blood sugar daily 2 each 2  . dextromethorphan-guaiFENesin (TUSSIN DM COUGH + CHEST) 10-100 MG/5ML liquid Take 10 mLs by  mouth every 4 (four) hours as needed for cough.    . docusate sodium (COLACE) 100 MG capsule Take 100 mg by mouth daily as needed for mild constipation.     Marland Kitchen escitalopram (LEXAPRO) 10 MG tablet TAKE 1 TABLET EVERY DAY 90 tablet 1  . fluticasone (FLONASE) 50 MCG/ACT nasal spray Place 2 sprays into both nostrils daily. (Patient taking differently: Place 2 sprays into both nostrils daily as needed for allergies. ) 16 g 6  . furosemide (LASIX) 40 MG tablet Take 40 mg by mouth. Takes as needed for swelling    . glimepiride (AMARYL) 2 MG tablet TAKE 1 TABLET BY MOUTH DAILY WITH BREAKFAST 90 tablet 0  . glucose blood (ACCU-CHEK GUIDE) test strip Check sugars three times daily and as needed when feeling ill E11.65, insulin use 100 each 11  . glucose blood test strip Use as instructed to check sugars three times daily E11.65, insulin use 100 each 12  . Insulin Pen Needle (B-D UF III MINI PEN NEEDLES) 31G X 5 MM MISC USE TO INJECT INSULIN DAILY 100 each 3  . LANCETS ULTRA THIN 30G MISC Check sugars three times daily and as needed when feeling ill E11.65, insulin use 100 each 11  . LANTUS SOLOSTAR 100 UNIT/ML Solostar Pen INJECT 15 UNITS INTO THE SKIN DAILY AT 10 PM 15 mL 3  .  loratadine (CLARITIN) 10 MG tablet Take 10 mg by mouth daily.    Marland Kitchen losartan (COZAAR) 50 MG tablet Take 50 mg by mouth daily.    Marland Kitchen lovastatin (MEVACOR) 20 MG tablet Take 1 tablet (20 mg total) by mouth once a week. 13 tablet 3  . Multiple Vitamins-Minerals ([facility] SPECTRAVITE PO) Take 1 tablet by mouth at bedtime.    . Omega-3 Fatty Acids (FISH OIL PO) Take 1 tablet by mouth at bedtime.    Marland Kitchen omeprazole (PRILOSEC) 40 MG capsule TAKE 1 CAPSULE EVERY DAY 90 capsule 1  . tacrolimus (PROGRAF) 1 MG capsule Take 1-2 mg by mouth See admin instructions. Take 2 mg by mouth in the morning, then take 1 mg  by mouth in the evening     No current facility-administered medications on file prior to visit.    Goals    . Patient Stated     Starting  10/15/18, I will continue to take medications as prescribed.     . Pharmacy Care Plan     Current Barriers:  . Chronic Disease Management support, education, and care coordination needs related to hypertension, heart failure, orthostatic hypotension, allergic rhinitis, type 2 diabetes, dyslipidemia, gout, depression, alcohol cirrhosis/status post liver transplant, portal hypertensive gastropathy, neuropathy, CKD  Pharmacist Clinical Goal(s):  Marland Kitchen Achieve A1c goal of < 7% and prevent adverse outcomes of elevated blood glucose. Check blood sugar every morning before breakfast. Continue to titrate Lantus by 2 units every 3 days until blood sugar is around 150 mg/dL (fasting).  Call if any low blood sugar less than 70 mg/dL. Improve snack choices and limit ice cream.  . Maintain blood pressure within goal of less than 140/90 mmHg. Continue to check blood pressure once weekly in the morning before medications. If elevated, re-check after sitting for 2-3 minutes. Watch for swelling and take furosemide as needed if swelling occurs.  . Remain up to date on vaccinations. Recommend 2 doses of the Shingrix vaccine.   Interventions: . Comprehensive medication review performed.  Patient Self Care Activities:  . Uses pillbox and takes medications as prescribed  . Self-monitors blood glucose  Please see past updates related to this goal by clicking on the "Past Updates" button in the selected goal       Diabetes   CMP Latest Ref Rng & Units 08/02/2019 08/01/2019 07/31/2019  Glucose 70 - 99 mg/dL 184(H) 227(H) 277(H)  BUN 6 - 20 mg/dL 16 22(H) 32(H)  Creatinine 0.61 - 1.24 mg/dL 1.18 1.21 1.60(H)  Sodium 135 - 145 mmol/L 138 136 134(L)  Potassium 3.5 - 5.1 mmol/L 3.8 3.4(L) 3.7  Chloride 98 - 111 mmol/L 107 103 103  CO2 22 - 32 mmol/L '24 23 22  '$ Calcium 8.9 - 10.3 mg/dL 8.4(L) 8.5(L) 8.1(L)  Total Protein 6.5 - 8.1 g/dL - - 5.8(L)  Total Bilirubin 0.3 - 1.2 mg/dL - - 0.9  Alkaline Phos 38 - 126 U/L - - 70   AST 15 - 41 U/L - - 14(L)  ALT 0 - 44 U/L - - 22   Recent Relevant Labs: Lab Results  Component Value Date/Time   HGBA1C 10.1 (H) 07/18/2019 11:33 AM   HGBA1C 7.7 (A) 04/23/2019 08:12 AM   HGBA1C 9.7 (A) 01/18/2019 08:31 AM   HGBA1C 7.8 (H) 10/15/2018 09:29 AM   MICROALBUR <0.7 08/27/2014 09:51 AM   A1c goal < 7%  Fasting: 80-130 After meals: < 180   Checking BG: checked 3 days per week, before breakfast Fasting  blood glucose: 260s, 308 today Hypoglycemia: denies   Diet: denies changes since last visit, reports appetite has increased  Breakfast: bacon, egg, cheese on toast or sausage biscuit   Lunch: sandwich, carrots with ranch   Dinner: Eats out on the weekends, baked or grilled chicken, corn, peas, green beans, instant potatoes; drinks only water or Powerade zero, occasional sweet tea  Snacks: Eating a lot of ice cream lately, has ice cream every night with TV  Exercise: none formally, but stays active during work   Patient has failed these meds in past: Ozempic (nausea/diarrhea), has not tried metformin but avoided due to renal function  Patient is currently uncontrolled (per A1c) on the following medications:   Glimepiride 2 mg - 1 tablet daily with breakfast  Lantus - Inject 25 units daily at night  (self- titrated to 25 units qhs as of last night)    Last diabetic eye exam:  Lab Results  Component Value Date/Time   HMDIABEYEEXA No Retinopathy 07/13/2017 12:00 AM    Last diabetic foot exam: toe is sore and red (about the size of a pencil eraser), no lacerations or cuts, feels fine until he hits it --> watch for worsening, if any changes call PCP prior to next appt  Plan: Recommend continuing to titrate Lantus by 2 units every 3 days until BG ~150 mg/dL. Limit ice cream; try healthier snack alternatives.   Hypertension   Office blood pressures are  BP Readings from Last 3 Encounters:  08/20/19 132/78  08/02/19 130/85  07/23/19 125/77   BP goal < 140/90  mmHg Patient has failed these meds in the past: doxazosin and amlodipine (discontinued 02/21 during hospital stay) Patient checks BP at home: infrequently  Patient home BP readings are ranging:138-140/75 mmHg  Patient is currently controlled on the following medications:   Losartan 50 mg daily   Furosemide 40 mg - 1 daily as needed for swelling   We discussed: doing well on losartan; denies swelling, has not taken furosemide  Plan: Continue current medications   CCM Follow Up: Nov 19, 2019 at 3:30 PM (telephone)   Debbora Dus, PharmD Clinical Pharmacist [facility] at [facility] [phone]

## 2019-09-30 NOTE — Patient Instructions (Addendum)
September 30, 2019  Dear [name],  Below is a summary of the goals we discussed at our telephone visit on September 30, 2019. Please contact me anytime with questions or concerns  Visit Information  Goals    . Patient Stated     Starting 10/15/18, I will continue to take medications as prescribed.     . Pharmacy Care Plan     Current Barriers:  . Chronic Disease Management support, education, and care coordination needs related to hypertension, heart failure, orthostatic hypotension, allergic rhinitis, type 2 diabetes, dyslipidemia, gout, depression, alcohol cirrhosis/status post liver transplant, portal hypertensive gastropathy, neuropathy, CKD  Pharmacist Clinical Goal(s):  Marland Kitchen Achieve A1c goal of < 7% and prevent adverse outcomes of elevated blood glucose. Check blood sugar every morning before breakfast. Continue to titrate Lantus by 2 units every 3 days until blood sugar is around 150 mg/dL (fasting).  Call if any low blood sugar less than 70 mg/dL. Improve snack choices and limit ice cream.  . Maintain blood pressure within goal of less than 140/90 mmHg. Continue to check blood pressure once weekly in the morning before medications. If elevated, re-check after sitting for 2-3 minutes. Watch for swelling and take furosemide as needed if swelling occurs.  . Remain up to date on vaccinations. Recommend 2 doses of the Shingrix vaccine.   Interventions: . Comprehensive medication review performed.  Patient Self Care Activities:  . Uses pillbox and takes medications as prescribed  . Self-monitors blood glucose  Please see past updates related to this goal by clicking on the "Past Updates" button in the selected goal       The patient verbalized understanding of instructions provided today and agreed to receive a mailed copy of patient instruction and/or educational materials.  Telephone follow up appointment with pharmacy team member scheduled for: Nov 19, 2019 at 3:30 PM  (telephone)  Debbora Dus, PharmD Clinical Pharmacist [facility] [phone]   Diabetes Mellitus and Nutrition, Adult When you have diabetes (diabetes mellitus), it is very important to have healthy eating habits because your blood sugar (glucose) levels are greatly affected by what you eat and drink. Eating healthy foods in the appropriate amounts, at about the same times every day, can help you:  Control your blood glucose.  Lower your risk of heart disease.  Improve your blood pressure.  Reach or maintain a healthy weight. Every person with diabetes is different, and each person has different needs for a meal plan. Your health care provider may recommend that you work with a diet and nutrition specialist (dietitian) to make a meal plan that is best for you. Your meal plan may vary depending on factors such as:  The calories you need.  The medicines you take.  Your weight.  Your blood glucose, blood pressure, and cholesterol levels.  Your activity level.  Other health conditions you have, such as heart or kidney disease. How do carbohydrates affect me? Carbohydrates, also called carbs, affect your blood glucose level more than any other type of food. Eating carbs naturally raises the amount of glucose in your blood. Carb counting is a method for keeping track of how many carbs you eat. Counting carbs is important to keep your blood glucose at a healthy level, especially if you use insulin or take certain oral diabetes medicines. It is important to know how many carbs you can safely have in each meal. This is different for every person. Your dietitian can help you  calculate how many carbs you should have at each meal and for each snack. Foods that contain carbs include:  Bread, cereal, rice, pasta, and crackers.  Potatoes and corn.  Peas, beans, and lentils.  Milk and yogurt.  Fruit and juice.  Desserts, such as cakes, cookies, ice cream,  and candy. How does alcohol affect me? Alcohol can cause a sudden decrease in blood glucose (hypoglycemia), especially if you use insulin or take certain oral diabetes medicines. Hypoglycemia can be a life-threatening condition. Symptoms of hypoglycemia (sleepiness, dizziness, and confusion) are similar to symptoms of having too much alcohol. If your health care provider says that alcohol is safe for you, follow these guidelines:  Limit alcohol intake to no more than 1 drink per day for nonpregnant women and 2 drinks per day for men. One drink equals 12 oz of beer, 5 oz of wine, or 1 oz of hard liquor.  Do not drink on an empty stomach.  Keep yourself hydrated with water, diet soda, or unsweetened iced tea.  Keep in mind that regular soda, juice, and other mixers may contain a lot of sugar and must be counted as carbs. What are tips for following this plan?  Reading food labels  Start by checking the serving size on the "Nutrition Facts" label of packaged foods and drinks. The amount of calories, carbs, fats, and other nutrients listed on the label is based on one serving of the item. Many items contain more than one serving per package.  Check the total grams (g) of carbs in one serving. You can calculate the number of servings of carbs in one serving by dividing the total carbs by 15. For example, if a food has 30 g of total carbs, it would be equal to 2 servings of carbs.  Check the number of grams (g) of saturated and trans fats in one serving. Choose foods that have low or no amount of these fats.  Check the number of milligrams (mg) of salt (sodium) in one serving. Most people should limit total sodium intake to less than 2,300 mg per day.  Always check the nutrition information of foods labeled as "low-fat" or "nonfat". These foods may be higher in added sugar or refined carbs and should be avoided.  Talk to your dietitian to identify your daily goals for nutrients listed on the  label. Shopping  Avoid buying canned, premade, or processed foods. These foods tend to be high in fat, sodium, and added sugar.  Shop around the outside edge of the grocery store. This includes fresh fruits and vegetables, bulk grains, fresh meats, and fresh dairy. Cooking  Use low-heat cooking methods, such as baking, instead of high-heat cooking methods like deep frying.  Cook using healthy oils, such as olive, canola, or sunflower oil.  Avoid cooking with butter, cream, or high-fat meats. Meal planning  Eat meals and snacks regularly, preferably at the same times every day. Avoid going long periods of time without eating.  Eat foods high in fiber, such as fresh fruits, vegetables, beans, and whole grains. Talk to your dietitian about how many servings of carbs you can eat at each meal.  Eat 4-6 ounces (oz) of lean protein each day, such as lean meat, chicken, fish, eggs, or tofu. One oz of lean protein is equal to: ? 1 oz of meat, chicken, or fish. ? 1 egg. ?  cup of tofu.  Eat some foods each day that contain healthy fats, such as avocado, nuts, seeds, and  fish. Lifestyle  Check your blood glucose regularly.  Exercise regularly as told by your health care provider. This may include: ? 150 minutes of moderate-intensity or vigorous-intensity exercise each week. This could be brisk walking, biking, or water aerobics. ? Stretching and doing strength exercises, such as yoga or weightlifting, at least 2 times a week.  Take medicines as told by your health care provider.  Do not use any products that contain nicotine or tobacco, such as cigarettes and e-cigarettes. If you need help quitting, ask your health care provider.  Work with a Social worker or diabetes educator to identify strategies to manage stress and any emotional and social challenges. Questions to ask a health care provider  Do I need to meet with a diabetes educator?  Do I need to meet with a dietitian?  What  number can I call if I have questions?  When are the best times to check my blood glucose? Where to find more information:  American Diabetes Association: diabetes.org  Academy of Nutrition and Dietetics: www.eatright.[employer] of Diabetes and Digestive and Kidney Diseases (NIH): [employer] Summary  A healthy meal plan will help you control your blood glucose and maintain a healthy lifestyle.  Working with a diet and nutrition specialist (dietitian) can help you make a meal plan that is best for you.  Keep in mind that carbohydrates (carbs) and alcohol have immediate effects on your blood glucose levels. It is important to count carbs and to use alcohol carefully. This information is not intended to replace advice given to you by your health care provider. Make sure you discuss any questions you have with your health care provider. Document Revised: 05/26/2017 Document Reviewed: 07/18/2016 Elsevier Patient Education  2020 [employer].

## 2019-10-07 DIAGNOSIS — Z79899 Other long term (current) drug therapy: Secondary | ICD-10-CM | POA: Diagnosis not present

## 2019-10-07 DIAGNOSIS — Z48298 Encounter for aftercare following other organ transplant: Secondary | ICD-10-CM | POA: Diagnosis not present

## 2019-10-07 DIAGNOSIS — Z944 Liver transplant status: Secondary | ICD-10-CM | POA: Diagnosis not present

## 2019-10-08 DIAGNOSIS — G4733 Obstructive sleep apnea (adult) (pediatric): Secondary | ICD-10-CM | POA: Diagnosis not present

## 2019-10-21 ENCOUNTER — Encounter: Payer: Self-pay | Admitting: Family Medicine

## 2019-10-21 MED ORDER — ESCITALOPRAM OXALATE 10 MG PO TABS: 10.0000 mg | ORAL_TABLET | Freq: Every day | ORAL | 0 refills | Status: DC

## 2019-10-21 MED ORDER — OMEPRAZOLE 40 MG PO CPDR: 40.0000 mg | DELAYED_RELEASE_CAPSULE | Freq: Every day | ORAL | 0 refills | Status: DC

## 2019-10-21 MED ORDER — ALLOPURINOL 300 MG PO TABS: 300.0000 mg | ORAL_TABLET | Freq: Every day | ORAL | 0 refills | Status: DC

## 2019-10-21 NOTE — Telephone Encounter (Signed)
E-scribed refill to [facility].  Notified pt via [place].

## 2019-10-23 ENCOUNTER — Other Ambulatory Visit: Payer: Self-pay

## 2019-10-23 ENCOUNTER — Telehealth: Payer: Self-pay | Admitting: Family Medicine

## 2019-10-23 ENCOUNTER — Encounter: Payer: Self-pay | Admitting: Family Medicine

## 2019-10-23 ENCOUNTER — Ambulatory Visit (INDEPENDENT_AMBULATORY_CARE_PROVIDER_SITE_OTHER): Payer: Medicare HMO | Admitting: Family Medicine

## 2019-10-23 ENCOUNTER — Other Ambulatory Visit: Payer: Self-pay | Admitting: Family Medicine

## 2019-10-23 VITALS — BP 140/78 | HR 84 | Temp 97.8°F | Ht 66.0 in | Wt 266.6 lb

## 2019-10-23 DIAGNOSIS — Z944 Liver transplant status: Secondary | ICD-10-CM | POA: Diagnosis not present

## 2019-10-23 DIAGNOSIS — E1169 Type 2 diabetes mellitus with other specified complication: Secondary | ICD-10-CM

## 2019-10-23 DIAGNOSIS — I5032 Chronic diastolic (congestive) heart failure: Secondary | ICD-10-CM

## 2019-10-23 DIAGNOSIS — Z794 Long term (current) use of insulin: Secondary | ICD-10-CM

## 2019-10-23 DIAGNOSIS — Z6841 Body Mass Index (BMI) 40.0 and over, adult: Secondary | ICD-10-CM

## 2019-10-23 LAB — POCT GLYCOSYLATED HEMOGLOBIN (HGB A1C): Hemoglobin A1C: 9.8 % — AB (ref 4.0–5.6)

## 2019-10-23 NOTE — Telephone Encounter (Signed)
E-scribed refill.  Pt scheduled 3 mo f/u on 01/22/20.  However, pt is due for wellness and lab visits.  Plz see if he's willing to change that July appt to a lab visit, then schedule his wellness and visit with Dewitt Hoes soon after that.

## 2019-10-23 NOTE — Assessment & Plan Note (Signed)
Chronic, uncontrolled. He has titrated lantus from 20 u to 31u nightly with persistent hyperglycemia, checks fasting sugars a few times a week. Discussed continued titration to 40u daily. Discussed additional metformin vs rybelsus vs jardiance - previous oral meds limited after liver transplant and with kidney disease - however latest kidney function actually improved. Will check labwork at upcoming physical to help guide further diabetes management - may now be able to tolerate metformin.

## 2019-10-23 NOTE — Telephone Encounter (Signed)
Noted  
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
## 2019-10-23 NOTE — Patient Instructions (Addendum)
DMV forms filled out today Sugar remains too high - continue titration of lantus by 2 units every 3 days if fasting sugar >150 to max 40 units daily - call us when you get there with sugar readings.  Price out rybelsus or jardiance - 2 different medicines for sugar control.  Return in 2-3 months for physical.

## 2019-10-23 NOTE — Telephone Encounter (Signed)
Called patient and got him rescheduled for Lab and AWV!

## 2019-10-23 NOTE — Assessment & Plan Note (Addendum)
On lasix 40mg  daily PRN - has not been taking since 06/2019 when hospitalized with COVID - stopped due to dehydration.  Seems euvolemic today, off lasix.  Caution with actos in h/o CHF.  Endorses intermittent exertional dyspnea limiting ability to walk 200 ft without stopping to rest - handicap placard filled out.

## 2019-11-06 DIAGNOSIS — Z944 Liver transplant status: Secondary | ICD-10-CM | POA: Diagnosis not present

## 2019-11-06 DIAGNOSIS — Z79899 Other long term (current) drug therapy: Secondary | ICD-10-CM | POA: Diagnosis not present

## 2019-11-06 DIAGNOSIS — Z48298 Encounter for aftercare following other organ transplant: Secondary | ICD-10-CM | POA: Diagnosis not present

## 2019-11-18 ENCOUNTER — Other Ambulatory Visit: Payer: Self-pay | Admitting: Family Medicine

## 2019-11-18 MED ORDER — LANTUS SOLOSTAR 100 UNIT/ML ~~LOC~~ SOPN: 40.0000 [IU] | PEN_INJECTOR | Freq: Every day | SUBCUTANEOUS | 3 refills | Status: DC

## 2019-11-18 NOTE — Addendum Note (Signed)
Addended by: Ria Bush on: 11/18/2019 04:44 PM   Modules accepted: Orders

## 2019-11-18 NOTE — Telephone Encounter (Signed)
Needs a rx for insulin at new dose.  The pharmacy will not refill the old Rx because its too soon -- pt has been taking new dose with the old Rx.   Pt will be out tonight - he takes his last dose today.   Please advise, thanks.

## 2019-11-18 NOTE — Telephone Encounter (Addendum)
What dose is he currently on? We last discussed titrating from 30u to 40u at night. Insulin Rx sent in.

## 2019-11-19 ENCOUNTER — Other Ambulatory Visit: Payer: Self-pay

## 2019-11-19 ENCOUNTER — Ambulatory Visit: Payer: Medicare HMO

## 2019-11-19 DIAGNOSIS — I10 Essential (primary) hypertension: Secondary | ICD-10-CM

## 2019-11-19 DIAGNOSIS — E1169 Type 2 diabetes mellitus with other specified complication: Secondary | ICD-10-CM

## 2019-11-19 NOTE — Telephone Encounter (Signed)
Spoke with [name].  He states he hasn't quite gotten up to 40 units daily.  He  is currently taking 33 units.

## 2019-11-19 NOTE — Chronic Care Management (AMB) (Signed)
Chronic Care Management Pharmacy  Name: Colton Porter  MRN: [id number] DOB: August 04, 1965  Chief Complaint/ HPI  [name],  54 y.o., male presents for their Follow-Up CCM visit with the clinical pharmacist via telephone.  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 Check labwork at upcoming physical to help guide further diabetes management - may now be able to tolerate metformin.    Current Outpatient Medications on File Prior to Visit  Medication Sig Dispense Refill  . Accu-Chek FastClix Lancets MISC Use three times daily to check sugars E65.11, insulin use 100 each 12  . acetaminophen (TYLENOL) 500 MG tablet Take 500 mg by mouth every 6 (six) hours as needed for mild pain.    Marland Kitchen albuterol (VENTOLIN HFA) 108 (90 Base) MCG/ACT inhaler Inhale 2 puffs into the lungs every 6 (six) hours as needed for wheezing or shortness of breath. 8 g 0  . allopurinol (ZYLOPRIM) 300 MG tablet Take 1 tablet (300 mg total) by mouth daily. 90 tablet 0  . ASPIR-LOW 81 MG EC tablet Take 1 tablet (81 mg total) by mouth daily. 90 tablet 3  . Blood Glucose Monitoring Suppl (ACCU-CHEK GUIDE) w/Device KIT 1 Units by Does not apply route as directed. 1 kit 0  . Cholecalciferol (VITAMIN D3) 2000 units TABS Take 1 tablet by mouth at bedtime.    . docusate sodium (COLACE) 100 MG capsule Take 100 mg by mouth daily as needed for mild constipation.     Marland Kitchen escitalopram (LEXAPRO) 10 MG tablet Take 1 tablet (10 mg total) by mouth daily. 90 tablet 0  . fluticasone (FLONASE) 50 MCG/ACT nasal spray Place 2 sprays into  both nostrils daily. (Patient taking differently: Place 2 sprays into both nostrils daily as needed for allergies. ) 16 g 6  . furosemide (LASIX) 40 MG tablet Take 40 mg by mouth. Takes as needed for swelling    . glimepiride (AMARYL) 2 MG tablet TAKE 1 TABLET BY MOUTH DAILY WITH BREAKFAST 90 tablet 0  . glucose blood (ACCU-CHEK GUIDE) test strip Check sugars three times daily and as needed when feeling ill E11.65, insulin use 100 each 11  . glucose blood test strip Use as instructed to check sugars three times daily E11.65, insulin use 100 each 12  . insulin glargine (LANTUS SOLOSTAR) 100 UNIT/ML Solostar Pen Inject 40 Units into the skin daily. 45 mL 3  . Insulin Pen Needle (B-D UF III MINI PEN NEEDLES) 31G X 5 MM MISC USE TO INJECT INSULIN DAILY 100 each 3  . LANCETS ULTRA THIN 30G MISC Check sugars three times daily and as needed when feeling ill E11.65, insulin use 100 each 11  . loratadine (CLARITIN) 10 MG tablet Take 10 mg by mouth daily.    Marland Kitchen losartan (COZAAR) 50 MG tablet TAKE 1 TABLET EVERY DAY 90 tablet 0  . lovastatin (MEVACOR) 20 MG tablet Take 1 tablet (20 mg total) by mouth once a week. 13 tablet 3  . Multiple Vitamins-Minerals ([employer] SPECTRAVITE PO) Take 1 tablet by mouth at bedtime.    Marland Kitchen  mycophenolate (MYFORTIC) 360 MG TBEC EC tablet     . Omega-3 Fatty Acids (FISH OIL PO) Take 1 tablet by mouth at bedtime.    Marland Kitchen omeprazole (PRILOSEC) 40 MG capsule Take 1 capsule (40 mg total) by mouth daily. 90 capsule 0  . tacrolimus (PROGRAF) 1 MG capsule Take 1-2 mg by mouth See admin instructions. Take 2 mg by mouth in the morning, then take 1 mg  by mouth in the evening     No current facility-administered medications on file prior to visit.   Goals    . Patient Stated     Starting 10/15/18, I will continue to take medications as prescribed.     . Pharmacy Care Plan     Current Barriers:  . Chronic Disease Management support, education, and care coordination needs related to hypertension,  heart failure, orthostatic hypotension, allergic rhinitis, type 2 diabetes, dyslipidemia, gout, depression, alcohol cirrhosis/status post liver transplant, portal hypertensive gastropathy, neuropathy, CKD  Pharmacist Clinical Goal(s):  Marland Kitchen Achieve A1c goal of < 7% and prevent adverse outcomes of elevated blood glucose. Check blood sugar every morning before breakfast. Continue to titrate Lantus by 2 units every 3 days until blood sugar is around 150 mg/dL (fasting).  Call if any low blood sugar less than 70 mg/dL. Improve snack choices and limit ice cream.  . Maintain blood pressure within goal of less than 140/90 mmHg. Continue to check blood pressure once weekly in the morning before medications. If elevated, re-check after sitting for 2-3 minutes. Watch for swelling and take furosemide as needed if swelling occurs.  . Remain up to date on vaccinations. Recommend 2 doses of the Shingrix vaccine.   Interventions: . Comprehensive medication review performed.  Patient Self Care Activities:  . Uses pillbox and takes medications as prescribed  . Self-monitors blood glucose  Please see past updates related to this goal by clicking on the "Past Updates" button in the selected goal       Diabetes   CMP Latest Ref Rng & Units 08/02/2019 08/01/2019 07/31/2019  Glucose 70 - 99 mg/dL 184(H) 227(H) 277(H)  BUN 6 - 20 mg/dL 16 22(H) 32(H)  Creatinine 0.61 - 1.24 mg/dL 1.18 1.21 1.60(H)  Sodium 135 - 145 mmol/L 138 136 134(L)  Potassium 3.5 - 5.1 mmol/L 3.8 3.4(L) 3.7  Chloride 98 - 111 mmol/L 107 103 103  CO2 22 - 32 mmol/L _0 Calcium 8.9 - 10.3 mg/dL 8.4(L) 8.5(L) 8.1(L)  Total Protein 6.5 - 8.1 g/dL - - 5.8(L)  Total Bilirubin 0.3 - 1.2 mg/dL - - 0.9  Alkaline Phos 38 - 126 U/L - - 70  AST 15 - 41 U/L - - 14(L)  ALT 0 - 44 U/L - - 22   Recent Relevant Labs: Lab Results  Component Value Date/Time   HGBA1C 9.8 (A) 10/23/2019 09:21 AM   HGBA1C 10.1 (H) 07/18/2019 11:33 AM   HGBA1C 7.7 (A)  04/23/2019 08:12 AM   HGBA1C 7.8 (H) 10/15/2018 09:29 AM   MICROALBUR <0.7 08/27/2014 09:51 AM   A1c goal < 7%  Fasting: 80-130 After meals: < 180   Checking BG: 2 days per week, before breakfast Fasting blood glucose: lowest - 218 (this Sunday), 288 this morning Hypoglycemia: denies   Patient has failed these meds in past: Ozempic (nausea/diarrhea), has not tried metformin but avoided due to renal function  Patient is currently uncontrolled on the following medications:   Glimepiride 2 mg - 1 tablet daily with breakfast  Lantus - Inject 33 units every evening (with continued titration by 2 units every 3 days up to 40 units or fasting BG < 150 mg/dL)  We discussed: A1c improved some since last visit, but remains uncontrolled. Pt forgot to titrate insulin as recommended at last PCP visit, but has increased from 31 to 33 units this week. Recommended continued titration over next 2 weeks, then follow up call to discuss additional medication therapy. Pt expressed concern with insulin cost as dose increases - will consider cost with additional medications.   Last diabetic eye exam:  Lab Results  Component Value Date/Time   HMDIABEYEEXA No Retinopathy 07/13/2017 12:00 AM    Last diabetic foot exam: updated 10/23/19 by PCP  Plan: Recommend continuing to titrate Lantus by 2 units every 3 days until BG ~150 mg/dL up to 40 units. Follow up call in 2 weeks.   Hypertension   Office blood pressures are  BP Readings from Last 3 Encounters:  10/23/19 140/78  08/20/19 132/78  08/02/19 130/85   BP goal < 140/90 mmHg Patient has failed these meds in the past: doxazosin and amlodipine (discontinued 02/21 during hospital stay) Patient checks BP at home: not checking  Patient home BP readings are ranging: none reported  Patient is currently controlled on the following medications:   Losartan 50 mg daily   Furosemide 40 mg - 1 daily as needed for swelling   We discussed: Denies swelling,  wears compression socks daily. Has not taken furosemide in several months. Reports breathing seems improved, able to walk further without SOB.  Plan: Continue current medications; Check weight daily for swelling and take Lasix for weight gain of 3 lbs in 24 hours.   CCM Follow Up: December 03, 2019 at 1:30 PM (telephone)   Debbora Dus, PharmD Clinical Pharmacist [facility] at [facility] [phone]

## 2019-11-20 NOTE — Progress Notes (Signed)
I have collaborated with the care management provider regarding care management and care coordination activities outlined in this encounter and have reviewed this encounter including documentation in the note and care plan. I am certifying that I agree with the content of this note and encounter as supervising physician.  

## 2019-12-03 ENCOUNTER — Telehealth: Payer: Self-pay

## 2019-12-03 ENCOUNTER — Ambulatory Visit: Payer: Medicare HMO

## 2019-12-03 DIAGNOSIS — Z794 Long term (current) use of insulin: Secondary | ICD-10-CM

## 2019-12-03 DIAGNOSIS — I10 Essential (primary) hypertension: Secondary | ICD-10-CM

## 2019-12-03 MED ORDER — METFORMIN HCL 500 MG PO TABS
ORAL_TABLET | ORAL | 1 refills | Status: DC
Start: 2019-12-03 — End: 2020-02-06

## 2019-12-03 NOTE — Chronic Care Management (AMB) (Signed)
Chronic Care Management Pharmacy  Name: Colton Porter  MRN: [id number] DOB: [birth year]  Chief Complaint/ HPI  Colton Porter,  54 y.o., male presents for their Follow-Up CCM visit with the clinical pharmacist via telephone.  PCP : Ria Bush, MD   No office visits since last CCM visit on 11/19/19  Current Outpatient Medications on File Prior to Visit  Medication Sig Dispense Refill  . Accu-Chek FastClix Lancets MISC Use three times daily to check sugars E65.11, insulin use 100 each 12  . acetaminophen (TYLENOL) 500 MG tablet Take 500 mg by mouth every 6 (six) hours as needed for mild pain.    Colton Porter albuterol (VENTOLIN HFA) 108 (90 Base) MCG/ACT inhaler Inhale 2 puffs into the lungs every 6 (six) hours as needed for wheezing or shortness of breath. 8 g 0  . allopurinol (ZYLOPRIM) 300 MG tablet Take 1 tablet (300 mg total) by mouth daily. 90 tablet 0  . ASPIR-LOW 81 MG EC tablet Take 1 tablet (81 mg total) by mouth daily. 90 tablet 3  . Blood Glucose Monitoring Suppl (ACCU-CHEK GUIDE) w/Device KIT 1 Units by Does not apply route as directed. 1 kit 0  . Cholecalciferol (VITAMIN D3) 2000 units TABS Take 1 tablet by mouth at bedtime.    . docusate sodium (COLACE) 100 MG capsule Take 100 mg by mouth daily as needed for mild constipation.     Colton Porter escitalopram (LEXAPRO) 10 MG tablet Take 1 tablet (10 mg total) by mouth daily. 90 tablet 0  . fluticasone (FLONASE) 50 MCG/ACT nasal spray Place 2 sprays into both nostrils daily. (Patient taking differently: Place 2 sprays into both nostrils daily as needed for allergies. ) 16 g 6  . furosemide (LASIX) 40 MG tablet Take 40 mg by mouth. Takes as needed for swelling    . glimepiride (AMARYL) 2 MG tablet TAKE 1 TABLET BY MOUTH DAILY WITH BREAKFAST 90 tablet 0  . glucose blood (ACCU-CHEK GUIDE) test strip Check sugars three times daily and as needed when feeling ill E11.65, insulin use 100 each 11  . glucose blood test strip Use as instructed to  check sugars three times daily E11.65, insulin use 100 each 12  . insulin glargine (LANTUS SOLOSTAR) 100 UNIT/ML Solostar Pen Inject 40 Units into the skin daily. 45 mL 3  . Insulin Pen Needle (B-D UF III MINI PEN NEEDLES) 31G X 5 MM MISC USE TO INJECT INSULIN DAILY 100 each 3  . LANCETS ULTRA THIN 30G MISC Check sugars three times daily and as needed when feeling ill E11.65, insulin use 100 each 11  . loratadine (CLARITIN) 10 MG tablet Take 10 mg by mouth daily.    Colton Porter losartan (COZAAR) 50 MG tablet TAKE 1 TABLET EVERY DAY 90 tablet 0  . lovastatin (MEVACOR) 20 MG tablet Take 1 tablet (20 mg total) by mouth once a week. 13 tablet 3  . Multiple Vitamins-Minerals ([employer] SPECTRAVITE PO) Take 1 tablet by mouth at bedtime.    . mycophenolate (MYFORTIC) 360 MG TBEC EC tablet     . Omega-3 Fatty Acids (FISH OIL PO) Take 1 tablet by mouth at bedtime.    Colton Porter omeprazole (PRILOSEC) 40 MG capsule Take 1 capsule (40 mg total) by mouth daily. 90 capsule 0  . tacrolimus (PROGRAF) 1 MG capsule Take 1-2 mg by mouth See admin instructions. Take 2 mg by mouth in the morning, then take 1 mg  by mouth in the evening     No current  facility-administered medications on file prior to visit.   Goals    . Patient Stated     Starting 10/15/18, I will continue to take medications as prescribed.     . Pharmacy Care Plan     Current Barriers:  . Chronic Disease Management support, education, and care coordination needs related to hypertension, type 2 diabetes  Pharmacist Clinical Goal(s):  Colton Porter Diabetes: Achieve A1c goal of < 7% and prevent adverse outcomes of elevated blood glucose.  Colton Porter Hypertension: Maintain blood pressure within goal of less than 140/90 mmHg.  . Vaccinations: Remain up to date on vaccinations. Recommend 2 doses of the Shingrix vaccine.   Interventions: . Comprehensive medication review performed. Geanie Cooley PCP for medication management  Patient Self Care Activities:  . Check blood sugar 3-4 days per  week before breakfast . Continue Lantus 40 units daily and glimepiride 2 mg daily with breakfast. Start metformin 500 mg daily with breakfast x 7 days, then increase to twice daily with meal if tolerated; Call if any concerns. . Call if any low blood sugar (less than 70 mg/dL)  . Continue to avoid ice cream and sweets.  . Continue to check blood pressure once weekly in the morning before medications. If elevated, re-check after sitting for 2-3 minutes. Watch for swelling and take furosemide as needed if swelling occurs.   Please see past updates related to this goal by clicking on the "Past Updates" button in the selected goal       Diabetes   CMP Latest Ref Rng & Units 08/02/2019 08/01/2019 07/31/2019  Glucose 70 - 99 mg/dL 184(H) 227(H) 277(H)  BUN 6 - 20 mg/dL 16 22(H) 32(H)  Creatinine 0.61 - 1.24 mg/dL 1.18 1.21 1.60(H)  Sodium 135 - 145 mmol/L 138 136 134(L)  Potassium 3.5 - 5.1 mmol/L 3.8 3.4(L) 3.7  Chloride 98 - 111 mmol/L 107 103 103  CO2 22 - 32 mmol/L '24 23 22  '$ Calcium 8.9 - 10.3 mg/dL 8.4(L) 8.5(L) 8.1(L)  Total Protein 6.5 - 8.1 g/dL - - 5.8(L)  Total Bilirubin 0.3 - 1.2 mg/dL - - 0.9  Alkaline Phos 38 - 126 U/L - - 70  AST 15 - 41 U/L - - 14(L)  ALT 0 - 44 U/L - - 22   Recent Relevant Labs: Lab Results  Component Value Date/Time   HGBA1C 9.8 (A) 10/23/2019 09:21 AM   HGBA1C 10.1 (H) 07/18/2019 11:33 AM   HGBA1C 7.7 (A) 04/23/2019 08:12 AM   HGBA1C 7.8 (H) 10/15/2018 09:29 AM   MICROALBUR <0.7 08/27/2014 09:51 AM   A1c goal < 7%  Fasting: 80-130 After meals: < 180   Checking BG: 2 days per week, before breakfast Fasting blood glucose: 198 (Friday), 248 (today) Hypoglycemia: denies   Patient has failed these meds in past: Ozempic (nausea/diarrhea), has not tried metformin but avoided due to renal function  Patient is currently uncontrolled on the following medications:   Glimepiride 2 mg - 1 tablet daily with breakfast  Lantus - Inject 40 units daily   We  discussed: Pt titrated insulin from 33 to 40 units over past 2 weeks and fasting glucose remains elevated, he is open to trial of metformin. Cost is primary concern and prefers to keep insulin dose lower for this reason.  Diet: has given up ice cream, had a late dinner last night - hot dogs; continues to avoid sodas, just powerade zero and water  Last diabetic eye exam:  Lab Results  Component Value Date/Time  HMDIABEYEEXA No Retinopathy 07/13/2017 12:00 AM    Last diabetic foot exam: updated 10/23/19 by PCP  Plan: Start metformin pending PCP consult. Follow up 1 month.   CCM Follow Up: January 08, 2020 at 3:30 PM (telephone)   Debbora Dus, PharmD Clinical Pharmacist [facility] at [facility] [phone]

## 2019-12-03 NOTE — Telephone Encounter (Signed)
PCP consult regarding CCM visit 12/03/19:  Pt has slowly titrated insulin since last PCP visit, increasing to Lantus to 40 units this past week and reports fasting BG still elevated. Checking BG 2 days per week before breakfast:198 (Friday - 6/4), 248 (today - 6/8). Denies hypoglycemia. His primary concern with increasing insulin further is cost. He is open to trying metformin which I think would be a good option. We discussed starting low to avoid GI upset. Pt aware changes are pending PCP approval/consultation.  Patient has failed these meds in past: Ozempic (nausea/diarrhea) Patient is currently uncontrolled on the following medications:   Glimepiride 2 mg - 1 tablet daily with breakfast  Lantus - Inject 40 units daily   Debbora Dus, PharmD Clinical Pharmacist [facility] at [facility] [phone]

## 2019-12-03 NOTE — Telephone Encounter (Signed)
Reasonable to try low dose metformin with recently improved kidney function - I have sent metformin 500mg  to take once daily. May increase to BID dosing after 1 week if tolerated well.  This will be in addition to his glimepiride and lantus.

## 2019-12-04 ENCOUNTER — Other Ambulatory Visit: Payer: Self-pay | Admitting: Family Medicine

## 2019-12-04 NOTE — Patient Instructions (Addendum)
Dear [name],  Below is a summary of the goals we discussed during our follow up appointment on December 04, 2019. Please contact me anytime with questions or concerns.   Visit Information  Goals Addressed            This Visit's Progress   . Pharmacy Care Plan       Current Barriers:  . Chronic Disease Management support, education, and care coordination needs related to hypertension, type 2 diabetes  Pharmacist Clinical Goal(s):  Marland Kitchen Diabetes: Achieve A1c goal of < 7% and prevent adverse outcomes of elevated blood glucose.  Marland Kitchen Hypertension: Maintain blood pressure within goal of less than 140/90 mmHg.  . Vaccinations: Remain up to date on vaccinations. Recommend 2 doses of the Shingrix vaccine.   Interventions: . Comprehensive medication review performed. Geanie Cooley PCP for medication management  Patient Self Care Activities:  . Check blood sugar 3-4 days per week before breakfast . Continue Lantus 40 units daily and glimepiride 2 mg daily with breakfast. Start metformin 500 mg daily with breakfast x 7 days, then increase to twice daily with meal if tolerated; Call if any concerns. . Call if any low blood sugar (less than 70 mg/dL)  . Continue to avoid ice cream and sweets.  . Continue to check blood pressure once weekly in the morning before medications. If elevated, re-check after sitting for 2-3 minutes. Watch for swelling and take furosemide as needed if swelling occurs.   Please see past updates related to this goal by clicking on the "Past Updates" button in the selected goal       Telephone follow up appointment with pharmacy team member scheduled for: January 08, 2020 at 3:30 PM (telephone)  Debbora Dus, PharmD Clinical Pharmacist [facility] [phone]  Metformin tablets What is this medicine? METFORMIN (met FOR min) is used to treat type 2 diabetes. It helps to control blood sugar. Treatment is combined with diet and exercise. This  medicine can be used alone or with other medicines for diabetes. This medicine may be used for other purposes; ask your health care provider or pharmacist if you have questions. COMMON BRAND NAME(S): Glucophage What should I tell my health care provider before I take this medicine? They need to know if you have any of these conditions:  anemia  dehydration  heart disease  frequently drink alcohol-containing beverages  kidney disease  liver disease  polycystic ovary syndrome  serious infection or injury  vomiting  an unusual or allergic reaction to metformin, other medicines, foods, dyes, or preservatives  pregnant or trying to get pregnant  breast-feeding How should I use this medicine? Take this medicine by mouth with a glass of water. Follow the directions on the prescription label. Take this medicine with food. Take your medicine at regular intervals. Do not take your medicine more often than directed. Do not stop taking except on your doctor's advice. Talk to your pediatrician regarding the use of this medicine in children. While this drug may be prescribed for children as young as 45 years of age for selected conditions, precautions do apply. Overdosage: If you think you have taken too much of this medicine contact a poison control center or emergency room at once. NOTE: This medicine is only for you. Do not share this medicine with others. What if I miss a dose? If you miss a dose, take it as soon as you can. If it is almost time for your next dose,  take only that dose. Do not take double or extra doses. What may interact with this medicine? Do not take this medicine with any of the following medications:  certain contrast medicines given before X-rays, CT scans, MRI, or other procedures  dofetilide This medicine may also interact with the following medications:  acetazolamide  alcohol  certain antivirals for HIV or hepatitis  certain medicines for blood  pressure, heart disease, irregular heart beat  cimetidine  dichlorphenamide  digoxin  diuretics  male hormones, like estrogens or progestins and birth control pills  glycopyrrolate  isoniazid  lamotrigine  memantine  methazolamide  metoclopramide  midodrine  niacin  phenothiazines like chlorpromazine, mesoridazine, prochlorperazine, thioridazine  phenytoin  ranolazine  steroid medicines like prednisone or cortisone  stimulant medicines for attention disorders, weight loss, or to stay awake  thyroid medicines  topiramate  trospium  vandetanib  zonisamide This list may not describe all possible interactions. Give your health care provider a list of all the medicines, herbs, non-prescription drugs, or dietary supplements you use. Also tell them if you smoke, drink alcohol, or use illegal drugs. Some items may interact with your medicine. What should I watch for while using this medicine? Visit your doctor or health care professional for regular checks on your progress. A test called the HbA1C (A1C) will be monitored. This is a simple blood test. It measures your blood sugar control over the last 2 to 3 months. You will receive this test every 3 to 6 months. Learn how to check your blood sugar. Learn the symptoms of low and high blood sugar and how to manage them. Always carry a quick-source of sugar with you in case you have symptoms of low blood sugar. Examples include hard sugar candy or glucose tablets. Make sure others know that you can choke if you eat or drink when you develop serious symptoms of low blood sugar, such as seizures or unconsciousness. They must get medical help at once. Tell your doctor or health care professional if you have high blood sugar. You might need to change the dose of your medicine. If you are sick or exercising more than usual, you might need to change the dose of your medicine. Do not skip meals. Ask your doctor or health care  professional if you should avoid alcohol. Many nonprescription cough and cold products contain sugar or alcohol. These can affect blood sugar. This medicine may cause ovulation in premenopausal women who do not have regular monthly periods. This may increase your chances of becoming pregnant. You should not take this medicine if you become pregnant or think you may be pregnant. Talk with your doctor or health care professional about your birth control options while taking this medicine. Contact your doctor or health care professional right away if you think you are pregnant. If you are going to need surgery, a MRI, CT scan, or other procedure, tell your doctor that you are taking this medicine. You may need to stop taking this medicine before the procedure. Wear a medical ID bracelet or chain, and carry a card that describes your disease and details of your medicine and dosage times. This medicine may cause a decrease in folic acid and vitamin B12. You should make sure that you get enough vitamins while you are taking this medicine. Discuss the foods you eat and the vitamins you take with your health care professional. What side effects may I notice from receiving this medicine? Side effects that you should report to your doctor  or health care professional as soon as possible:  allergic reactions like skin rash, itching or hives, swelling of the face, lips, or tongue  breathing problems  feeling faint or lightheaded, falls  muscle aches or pains  signs and symptoms of low blood sugar such as feeling anxious, confusion, dizziness, increased hunger, unusually weak or tired, sweating, shakiness, cold, irritable, headache, blurred vision, fast heartbeat, loss of consciousness  slow or irregular heartbeat  unusual stomach pain or discomfort  unusually tired or weak Side effects that usually do not require medical attention (report to your doctor or health care professional if they continue or are  bothersome):  diarrhea  headache  heartburn  metallic taste in mouth  nausea  stomach gas, upset This list may not describe all possible side effects. Call your doctor for medical advice about side effects. You may report side effects to FDA at 1-800-FDA-1088. Where should I keep my medicine? Keep out of the reach of children. Store at room temperature between 15 and 30 degrees C (59 and 86 degrees F). Protect from moisture and light. Throw away any unused medicine after the expiration date. NOTE: This sheet is a summary. It may not cover all possible information. If you have questions about this medicine, talk to your doctor, pharmacist, or health care provider.  2020 Elsevier/Gold Standard (2017-07-20 19:15:19)

## 2019-12-06 ENCOUNTER — Encounter: Payer: Self-pay | Admitting: Cardiology

## 2019-12-06 ENCOUNTER — Other Ambulatory Visit: Payer: Self-pay

## 2019-12-06 ENCOUNTER — Ambulatory Visit: Payer: Medicare HMO | Admitting: Cardiology

## 2019-12-06 VITALS — BP 142/86 | HR 88 | Ht 66.5 in | Wt 271.0 lb

## 2019-12-06 DIAGNOSIS — I5033 Acute on chronic diastolic (congestive) heart failure: Secondary | ICD-10-CM | POA: Diagnosis not present

## 2019-12-06 DIAGNOSIS — Z944 Liver transplant status: Secondary | ICD-10-CM

## 2019-12-06 DIAGNOSIS — Z48298 Encounter for aftercare following other organ transplant: Secondary | ICD-10-CM | POA: Diagnosis not present

## 2019-12-06 DIAGNOSIS — I10 Essential (primary) hypertension: Secondary | ICD-10-CM

## 2019-12-06 DIAGNOSIS — Z79899 Other long term (current) drug therapy: Secondary | ICD-10-CM | POA: Diagnosis not present

## 2019-12-06 MED ORDER — FUROSEMIDE 40 MG PO TABS
40.0000 mg | ORAL_TABLET | Freq: Every day | ORAL | 3 refills | Status: DC
Start: 2019-12-06 — End: 2019-12-06

## 2019-12-06 MED ORDER — LOSARTAN POTASSIUM 100 MG PO TABS: 100.0000 mg | ORAL_TABLET | Freq: Every day | ORAL | 3 refills | Status: DC

## 2019-12-06 MED ORDER — FUROSEMIDE 40 MG PO TABS: 40.0000 mg | ORAL_TABLET | Freq: Every day | ORAL | 3 refills | Status: DC

## 2019-12-06 NOTE — Patient Instructions (Signed)
Your physician has recommended you make the following change in your medication: INCREASE LOSARTAN TO 100 MG EVERY DAY ADD FUROSEMIDE 40 MG EVERY DAY   Your physician recommends that you schedule a follow-up appointment in: [place]

## 2019-12-06 NOTE — Progress Notes (Signed)
Cardiology Office Note:    Date:  12/06/2019   ID:  Colton Porter, DOB 28-Aug-1965, MRN [id number]  PCP:  Ria Bush, MD  [facility] Cardiologist:  No primary care provider on file.  [facility] Electrophysiologist:  None   Referring MD: Ria Bush, MD   Chief complaint: Lower extremity edema  History of Present Illness:    Colton Porter is a 54 y.o. male with a hx of hyperlipidemia,obesity, OSA on CPAP since 06/2013, hypertension, chronic diastolic dysfunction,chronic thrombocytopenia with platelet around 64, significant drinking history with subsequent alcoholic cirrhosis and liver transplant in July 2017 and is being followed by [facility] liver transplant team .He has family history of early CAD with his brother having first MI at age 44. He had left and right heart catheterization on 06/10/2013 which showed normal coronaries, elevated right heart pressure likely related to diastolic dysfunction. He was evaluated 6 months ago for dyspnea.  2D echo 05/2016 showed normal LVEF at 55-60% with grade 2DD. No wall motion abnormalities.  Test in 2018 showed normal LVEF and no ischemia or prior infarct.  12/06/2019 - the patient is coming after 6 months, he states that he had Covid infection in January of this year did require 12 days of hospitalization but no intubation.  He had an echocardiogram done at that time that showed hyperdynamic LVEF 65 to 70%.  Moderate concentric LVH, grade 1 diastolic dysfunction no significant valvular abnormality.  At the time he has lost 25 pounds but has gained 10 since then.  While he was in the hospital he was hypotensive, his Lasix was discontinued.  He has no needs lower extremity edema since then, he controls it with compression socks.  He is compliant with his CPAP machine.  He denies any significant exertional shortness of breath and has no chest pain.  No orthopnea or proximal nocturnal dyspnea.  Past Medical History:  Diagnosis Date  .  Alcohol dependence ([place]) 07/11/2012  . Alcoholic cirrhosis of liver with ascites ([place]) 07/2014   s./p transplant 12/2015  . Allergy   . Anemia   . Cellulitis of left leg   . Chronic diastolic heart failure ([place]) 11/01/2013  . CKD (chronic kidney disease) stage 3, GFR 30-59 ml/min 11/04/2015  . Diabetes mellitus without complication ([place])   . GERD (gastroesophageal reflux disease)   . Hyperlipidemia   . Hypertension   . Neuropathy   . OSA on CPAP 07/11/2012   HST 07/2013:  AHI 39/hr.    . Pneumonia due to COVID-19 virus 06/2019  . Thrombocytopenia ([place]) 12/15/2011    Past Surgical History:  Procedure Laterality Date  . COLONOSCOPY  08/2013   hyperplastic polyps, hemorrhoids Ardis Hughs)  . 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 FINGER AMPUTATION  1997   left 4th finger - radial arm saw  . LEFT AND RIGHT HEART CATHETERIZATION WITH CORONARY ANGIOGRAM N/A 06/10/2013   Procedure: LEFT AND RIGHT HEART CATHETERIZATION WITH CORONARY ANGIOGRAM;  Surgeon: Blane Ohara, MD;  Location: [facility] CATH LAB;  Service: Cardiovascular;  Laterality: N/A;  . LIVER TRANSPLANTATION  12/8673   alcoholic cirrhosis ([facility])    Current Medications: Current Meds  Medication Sig  . Accu-Chek FastClix Lancets MISC Use three times daily to check sugars E65.11, insulin use  . acetaminophen (TYLENOL) 500 MG tablet Take 500 mg by mouth every 6 (six) hours as needed for mild pain.  Marland Kitchen allopurinol (ZYLOPRIM) 300 MG tablet Take 1 tablet (300  mg total) by mouth daily.  . ASPIR-LOW 81 MG EC tablet Take 1 tablet (81 mg total) by mouth daily.  . Blood Glucose Monitoring Suppl (ACCU-CHEK GUIDE) w/Device KIT 1 Units by Does not apply route as directed.  . Cholecalciferol (VITAMIN D3) 2000 units TABS Take 1 tablet by mouth at bedtime.  . docusate sodium (COLACE) 100 MG capsule Take 100 mg by mouth daily as needed for mild constipation.   Marland Kitchen escitalopram (LEXAPRO) 10 MG tablet  Take 1 tablet (10 mg total) by mouth daily.  . fluticasone (FLONASE) 50 MCG/ACT nasal spray Place 2 sprays into both nostrils daily.  Marland Kitchen glimepiride (AMARYL) 2 MG tablet TAKE 1 TABLET BY MOUTH DAILY WITH BREAKFAST  . glucose blood (ACCU-CHEK GUIDE) test strip Check sugars three times daily and as needed when feeling ill E11.65, insulin use  . glucose blood test strip Use as instructed to check sugars three times daily E11.65, insulin use  . insulin glargine (LANTUS SOLOSTAR) 100 UNIT/ML Solostar Pen Inject 40 Units into the skin daily.  . Insulin Pen Needle (B-D UF III MINI PEN NEEDLES) 31G X 5 MM MISC USE TO INJECT INSULIN DAILY  . LANCETS ULTRA THIN 30G MISC Check sugars three times daily and as needed when feeling ill E11.65, insulin use  . loratadine (CLARITIN) 10 MG tablet Take 10 mg by mouth daily.  Marland Kitchen lovastatin (MEVACOR) 20 MG tablet Take 1 tablet (20 mg total) by mouth once a week.  . metFORMIN (GLUCOPHAGE) 500 MG tablet Take 1 tablet (500 mg total) by mouth daily with breakfast for 7 days, THEN 1 tablet (500 mg total) 2 (two) times daily with a meal.  . Multiple Vitamins-Minerals ([employer] SPECTRAVITE PO) Take 1 tablet by mouth at bedtime.  . mycophenolate (MYFORTIC) 360 MG TBEC EC tablet   . Omega-3 Fatty Acids (FISH OIL PO) Take 1 tablet by mouth at bedtime.  Marland Kitchen omeprazole (PRILOSEC) 40 MG capsule Take 1 capsule (40 mg total) by mouth daily.  . tacrolimus (PROGRAF) 1 MG capsule Take 1-2 mg by mouth See admin instructions. Take 2 mg by mouth in the morning, then take 1 mg  by mouth in the evening  . [DISCONTINUED] furosemide (LASIX) 40 MG tablet Take 40 mg by mouth. Takes as needed for swelling     Allergies:   Tolmetin, Ambien [zolpidem tartrate], Morphine and related, Hydrochlorothiazide w-triamterene, and Lisinopril   Social History   Socioeconomic History  . Marital status: Married    Spouse name: Not on file  . Number of children: 2  . Years of education: Not on file  . Highest  education level: Not on file  Occupational History  . Occupation: Investment banker, corporate    Comment: no work lately  Tobacco Use  . Smoking status: Never Smoker  . Smokeless tobacco: Former Systems developer    Types: Secondary school teacher  . Vaping Use: Never used  Substance and Sexual Activity  . Alcohol use: No    Alcohol/week: 0.0 standard drinks    Comment: 4-6 drinks daily - NO ETOH SINCE APRIL 2016  . Drug use: Not Currently    Comment: remote use of marijuana in the past, has since quit  . Sexual activity: Not on file  Other Topics Concern  . Not on file  Social History Narrative    Lives with wife   Occupation: full disability after cirrhosis dx, some working    Activity: some yardwork   Diet: good water, good fruit intake, lots  of red meats   Social Determinants of Health   Financial Resource Strain:   . Difficulty of Paying Living Expenses:   Food Insecurity:   . Worried About Charity fundraiser in the Last Year:   . Arboriculturist in the Last Year:   Transportation Needs:   . Film/video editor (Medical):   Marland Kitchen Lack of Transportation (Non-Medical):   Physical Activity:   . Days of Exercise per Week:   . Minutes of Exercise per Session:   Stress:   . Feeling of Stress :   Social Connections:   . Frequency of Communication with Friends and Family:   . Frequency of Social Gatherings with Friends and Family:   . Attends Religious Services:   . Active Member of Clubs or Organizations:   . Attends Archivist Meetings:   Marland Kitchen Marital Status:      Family History: The patient's family history includes Emphysema in his father and mother; Heart disease in his father; Hypertension in his father; Lung cancer in his maternal aunt; Stroke in his mother and paternal grandmother. There is no history of Heart attack.  ROS:   Please see the history of present illness.    All other systems reviewed and are negative.  EKGs/Labs/Other Studies Reviewed:    The following studies  were reviewed today:  EKG:  EKG is ordered today.  The ekg ordered today demonstrates SR, normal EKG, unchanged from prior.  This was personally reviewed.  Recent Labs: 07/18/2019: TSH 0.716 07/19/2019: B Natriuretic Peptide 181.0 07/20/2019: Magnesium 1.9 07/31/2019: ALT 22 08/02/2019: BUN 16; Creatinine, Ser 1.18; Hemoglobin 10.9; Platelets 129; Potassium 3.8; Sodium 138  Recent Lipid Panel    Component Value Date/Time   CHOL 92 10/15/2018 0929   TRIG 128 07/18/2019 1137   HDL 26.10 (L) 10/15/2018 0929   CHOLHDL 4 10/15/2018 0929   VLDL 59.6 (H) 10/15/2018 0929   LDLCALC 44 10/06/2017 1017   LDLDIRECT 43.0 10/15/2018 0929   Physical Exam:    VS:  BP (!) 142/86   Pulse 88   Ht 5' 6.5" (1.689 m)   Wt 271 lb (122.9 kg)   SpO2 95%   BMI 43.09 kg/m     Wt Readings from Last 3 Encounters:  12/06/19 271 lb (122.9 kg)  10/23/19 266 lb 9 oz (120.9 kg)  08/20/19 257 lb 3 oz (116.7 kg)    GEN: Obese, well developed in no acute distress HEENT: Normal NECK: No JVD; No carotid bruits LYMPHATICS: No lymphadenopathy CARDIAC: RRR, no murmurs, rubs, gallops RESPIRATORY:  Clear to auscultation without rales, wheezing or rhonchi  ABDOMEN: Soft, non-tender, non-distended MUSCULOSKELETAL:  No edema; No deformity  SKIN: Warm and dry NEUROLOGIC:  Alert and oriented x 3 PSYCHIATRIC:  Normal affect    ASSESSMENT:    1. Hypertension, unspecified type   2. Acute on chronic diastolic heart failure ([place])   3. H/O liver transplant ([place])   4. Morbid obesity ([place])    PLAN:    In order of problems listed above:  1. Acute  on chronic Diastolic HF: mild bilateral LEE on exam, I will restart Lasix 40 mg daily.  2. HTN: uncontrolled, amlodipine and Lasix were discontinued while he had Covid infection.  I will increase losartan to 100 mg daily and restart Lasix 40 mg daily.  3. H/o Liver Transplant: followed by specialist in [place].   4. OSA: compliant with CPAP nightly. Followed by  pulmonology.   5. CKD:  followed by nephrology.,  Most recent GFR over 60.  6. Obesity: pt is motivated to lose weight his goal is to 50 pounds.  7. DLD: not on statin given h/o liver transplant.   Medication Adjustments/Labs and Tests Ordered: Current medicines are reviewed at length with the patient today.  Concerns regarding medicines are outlined above.  Orders Placed This Encounter  Procedures  . EKG 12-Lead   Meds ordered this encounter  Medications  . DISCONTD: furosemide (LASIX) 40 MG tablet    Sig: Take 1 tablet (40 mg total) by mouth daily.    Dispense:  90 tablet    Refill:  3  . DISCONTD: losartan (COZAAR) 100 MG tablet    Sig: Take 1 tablet (100 mg total) by mouth daily.    Dispense:  90 tablet    Refill:  3  . furosemide (LASIX) 40 MG tablet    Sig: Take 1 tablet (40 mg total) by mouth daily.    Dispense:  90 tablet    Refill:  3  . losartan (COZAAR) 100 MG tablet    Sig: Take 1 tablet (100 mg total) by mouth daily.    Dispense:  90 tablet    Refill:  3    Patient Instructions  Your physician has recommended you make the following change in your medication: INCREASE LOSARTAN TO 100 MG EVERY DAY ADD FUROSEMIDE 40 MG EVERY DAY   Your physician recommends that you schedule a follow-up appointment in: [place] DR Molly Maduro, Ena Dawley, MD  12/06/2019 10:04 AM    [place]

## 2019-12-08 NOTE — Progress Notes (Signed)
I have collaborated with the care management provider regarding care management and care coordination activities outlined in this encounter and have reviewed this encounter including documentation in the note and care plan. I am certifying that I agree with the content of this note and encounter as supervising physician.  

## 2019-12-09 ENCOUNTER — Telehealth: Payer: Self-pay | Admitting: Cardiology

## 2019-12-09 NOTE — Telephone Encounter (Signed)
Follow Up:     Pt's Losartan, and Furosemide were called in to the wrong pharmacy on Friday. They need to go to [place] please.

## 2019-12-09 NOTE — Telephone Encounter (Signed)
Rx's were sent to [facility] for one year supplies on 12/06/2019.

## 2019-12-24 ENCOUNTER — Other Ambulatory Visit: Payer: Self-pay | Admitting: Family Medicine

## 2019-12-27 ENCOUNTER — Other Ambulatory Visit: Payer: Self-pay | Admitting: Nurse Practitioner

## 2019-12-27 DIAGNOSIS — Z794 Long term (current) use of insulin: Secondary | ICD-10-CM | POA: Diagnosis not present

## 2019-12-27 DIAGNOSIS — K760 Fatty (change of) liver, not elsewhere classified: Secondary | ICD-10-CM | POA: Diagnosis not present

## 2019-12-27 DIAGNOSIS — C44211 Basal cell carcinoma of skin of unspecified ear and external auricular canal: Secondary | ICD-10-CM | POA: Diagnosis not present

## 2019-12-27 DIAGNOSIS — D899 Disorder involving the immune mechanism, unspecified: Secondary | ICD-10-CM | POA: Diagnosis not present

## 2019-12-27 DIAGNOSIS — E785 Hyperlipidemia, unspecified: Secondary | ICD-10-CM | POA: Diagnosis not present

## 2019-12-27 DIAGNOSIS — Z944 Liver transplant status: Secondary | ICD-10-CM | POA: Diagnosis not present

## 2019-12-27 DIAGNOSIS — N189 Chronic kidney disease, unspecified: Secondary | ICD-10-CM | POA: Diagnosis not present

## 2019-12-27 DIAGNOSIS — E1165 Type 2 diabetes mellitus with hyperglycemia: Secondary | ICD-10-CM | POA: Diagnosis not present

## 2019-12-31 ENCOUNTER — Telehealth: Payer: Self-pay

## 2019-12-31 NOTE — Telephone Encounter (Signed)
Pt said starting 12/30/19 began with pain (pain level 5) to all of lt toes and upper foot where arch is; pt has compression socks on but said usually no redness or swelling with gout attacks. Pt has been taking allopurinol as instructed. (Pt has not missed any doses of allopurinol). Pt has no covid symptoms, no travel and no known exposure to + covid. Pt had Pfizer covid vaccines on 11/07/19 and 11/28/19. Pt wants refill of prednisone. Pt scheduled in office appt on 01/01/20 at 11:15 and pt will be at [facility] for check in at 11 AM. UC & ED precautions given and pt voiced understanding.

## 2019-12-31 NOTE — Telephone Encounter (Signed)
Will see tomorrow

## 2020-01-01 ENCOUNTER — Other Ambulatory Visit: Payer: Self-pay

## 2020-01-01 ENCOUNTER — Encounter: Payer: Self-pay | Admitting: Family Medicine

## 2020-01-01 ENCOUNTER — Ambulatory Visit (INDEPENDENT_AMBULATORY_CARE_PROVIDER_SITE_OTHER): Payer: Medicare HMO | Admitting: Family Medicine

## 2020-01-01 VITALS — BP 136/80 | HR 90 | Temp 97.5°F | Ht 66.5 in | Wt 272.0 lb

## 2020-01-01 DIAGNOSIS — Z944 Liver transplant status: Secondary | ICD-10-CM

## 2020-01-01 DIAGNOSIS — E785 Hyperlipidemia, unspecified: Secondary | ICD-10-CM | POA: Diagnosis not present

## 2020-01-01 DIAGNOSIS — M1A0790 Idiopathic chronic gout, unspecified ankle and foot, without tophus (tophi): Secondary | ICD-10-CM

## 2020-01-01 DIAGNOSIS — I5032 Chronic diastolic (congestive) heart failure: Secondary | ICD-10-CM

## 2020-01-01 DIAGNOSIS — M79672 Pain in left foot: Secondary | ICD-10-CM | POA: Diagnosis not present

## 2020-01-01 DIAGNOSIS — E1169 Type 2 diabetes mellitus with other specified complication: Secondary | ICD-10-CM | POA: Diagnosis not present

## 2020-01-01 DIAGNOSIS — Z794 Long term (current) use of insulin: Secondary | ICD-10-CM

## 2020-01-01 MED ORDER — DICLOFENAC SODIUM 1 % EX GEL
2.0000 g | Freq: Three times a day (TID) | CUTANEOUS | Status: DC
Start: 2020-01-01 — End: 2020-01-01

## 2020-01-01 MED ORDER — DICLOFENAC SODIUM 1 % EX GEL
2.0000 g | Freq: Two times a day (BID) | CUTANEOUS | Status: DC
Start: 2020-01-01 — End: 2020-06-08

## 2020-01-01 MED ORDER — PREDNISONE 20 MG PO TABS: ORAL_TABLET | ORAL | 0 refills | Status: DC

## 2020-01-01 NOTE — Assessment & Plan Note (Addendum)
In h/o gout and similar presentation in the past, anticipate recurrent gout attack, less likely foot tendonitis vs other cause. rec start topical voltaren gel TID to foot, and if ineffective may fill prednisone 7d taper. Reviewed hyperglycemia precautions while on prednisone.

## 2020-01-01 NOTE — Patient Instructions (Addendum)
This could be beginnings of gout or possible foot tendonitis from prolonged flip flop use.  Treat with prednisone taper sent to pharmacy. May also use voltaren (diclofenac) gel topical anti inflammatory medication which is now over the counter - may use 1-2 times a day.

## 2020-01-01 NOTE — Assessment & Plan Note (Signed)
Saw cards, started lasix 40mg  daily.

## 2020-01-01 NOTE — Progress Notes (Addendum)
This visit was conducted in person.  BP 136/80 (BP Location: Right Arm, Patient Position: Sitting, Cuff Size: Large)   Pulse 90   Temp (!) 97.5 F (36.4 C) (Temporal)   Ht 5' 6.5" (1.689 m)   Wt 272 lb (123.4 kg)   SpO2 95%   BMI 43.24 kg/m   BP Readings from Last 3 Encounters:  01/01/20 136/80  12/06/19 (!) 142/86  10/23/19 140/78    CC: L foot pain  Subjective:    Patient ID: Colton Porter, male    DOB: 10/02/1965, 54 y.o.   MRN: [id number]  HPI: Colton Porter is a 54 y.o. male presenting on 01/01/2020 for Gout (C/o gout flare in left foot.  Started 12/30/19.)   2d h/o L foot pain to dorsal foot with radiation up calf (soreness). Last night took 2 tylenol for pain. Denies inciting trauma or injury. Today some better. Seems to improve with ambulation, worse with prolonged sitting. May have started after prolonged flip-flop use over holiday weekend.   Lasix recently restarted last month by cardiology for CHF '40mg'$  daily.  Saw [name] Drazel from liver clinic last week.   Known h/o gout managed with daily allopurinol '300mg'$ .  S/p liver transplant on prograf and mycophenolate - planning to start daily cholesterol medication per transplant clinic.  Poorly controlled diabetic last A1c 9.8%. Has not started higher metformin dose yet, unsure if taking glimepiride yet. Also on lantus.      Relevant past medical, surgical, family and social history reviewed and updated as indicated. Interim medical history since our last visit reviewed. Allergies and medications reviewed and updated. Outpatient Medications Prior to Visit  Medication Sig Dispense Refill  . Accu-Chek FastClix Lancets MISC Use three times daily to check sugars E65.11, insulin use 100 each 12  . acetaminophen (TYLENOL) 500 MG tablet Take 500 mg by mouth every 6 (six) hours as needed for mild pain.    Marland Kitchen allopurinol (ZYLOPRIM) 300 MG tablet Take 1 tablet (300 mg total) by mouth daily. 90 tablet 0  . ASPIR-LOW 81 MG EC  tablet Take 1 tablet (81 mg total) by mouth daily. 90 tablet 3  . Blood Glucose Monitoring Suppl (ACCU-CHEK GUIDE) w/Device KIT 1 Units by Does not apply route as directed. 1 kit 0  . Cholecalciferol (VITAMIN D3) 2000 units TABS Take 1 tablet by mouth at bedtime.    . docusate sodium (COLACE) 100 MG capsule Take 100 mg by mouth daily as needed for mild constipation.     Marland Kitchen escitalopram (LEXAPRO) 10 MG tablet Take 1 tablet (10 mg total) by mouth daily. 90 tablet 0  . fluticasone (FLONASE) 50 MCG/ACT nasal spray SPRAY 2 SPRAYS INTO EACH NOSTRIL EVERY DAY 48 mL 0  . furosemide (LASIX) 40 MG tablet Take 1 tablet (40 mg total) by mouth daily. 90 tablet 3  . glimepiride (AMARYL) 2 MG tablet TAKE 1 TABLET BY MOUTH DAILY WITH BREAKFAST 90 tablet 0  . glucose blood (ACCU-CHEK GUIDE) test strip Check sugars three times daily and as needed when feeling ill E11.65, insulin use 100 each 11  . glucose blood test strip Use as instructed to check sugars three times daily E11.65, insulin use 100 each 12  . insulin glargine (LANTUS SOLOSTAR) 100 UNIT/ML Solostar Pen Inject 40 Units into the skin daily. 45 mL 3  . Insulin Pen Needle (B-D UF III MINI PEN NEEDLES) 31G X 5 MM MISC USE TO INJECT INSULIN DAILY 100 each 3  . loratadine (  CLARITIN) 10 MG tablet Take 10 mg by mouth daily.    Marland Kitchen losartan (COZAAR) 100 MG tablet Take 1 tablet (100 mg total) by mouth daily. 90 tablet 3  . lovastatin (MEVACOR) 20 MG tablet Take 1 tablet (20 mg total) by mouth once a week. 13 tablet 3  . metFORMIN (GLUCOPHAGE) 500 MG tablet Take 1 tablet (500 mg total) by mouth daily with breakfast for 7 days, THEN 1 tablet (500 mg total) 2 (two) times daily with a meal. 180 tablet 1  . Multiple Vitamins-Minerals ([employer] SPECTRAVITE PO) Take 1 tablet by mouth at bedtime.    . mycophenolate (MYFORTIC) 360 MG TBEC EC tablet     . Omega-3 Fatty Acids (FISH OIL PO) Take 1 tablet by mouth at bedtime.    Marland Kitchen omeprazole (PRILOSEC) 40 MG capsule Take 1 capsule  (40 mg total) by mouth daily. 90 capsule 0  . tacrolimus (PROGRAF) 1 MG capsule Take 1-2 mg by mouth See admin instructions. Take 2 mg by mouth in the morning, then take 1 mg  by mouth in the evening    . LANCETS ULTRA THIN 30G MISC Check sugars three times daily and as needed when feeling ill E11.65, insulin use 100 each 11   No facility-administered medications prior to visit.     Per HPI unless specifically indicated in ROS section below Review of Systems Objective:  BP 136/80 (BP Location: Right Arm, Patient Position: Sitting, Cuff Size: Large)   Pulse 90   Temp (!) 97.5 F (36.4 C) (Temporal)   Ht 5' 6.5" (1.689 m)   Wt 272 lb (123.4 kg)   SpO2 95%   BMI 43.24 kg/m   Wt Readings from Last 3 Encounters:  01/01/20 272 lb (123.4 kg)  12/06/19 271 lb (122.9 kg)  10/23/19 266 lb 9 oz (120.9 kg)      Physical Exam Vitals and nursing note reviewed.  Constitutional:      Appearance: Normal appearance. He is not ill-appearing.  Musculoskeletal:        General: Swelling (knee high compression stockings in place bilaterally) present. Normal range of motion.     Right lower leg: No edema.     Comments: 2+ DP bilaterally  Skin:    General: Skin is warm and dry.     Capillary Refill: Capillary refill takes less than 2 seconds.     Findings: Erythema (slight to L dorsal foot) present. No rash.  Neurological:     Mental Status: He is alert.  Psychiatric:        Mood and Affect: Mood normal.        Behavior: Behavior normal.       Results for orders placed or performed in visit on 10/23/19  POCT glycosylated hemoglobin (Hb A1C)  Result Value Ref Range   Hemoglobin A1C 9.8 (A) 4.0 - 5.6 %   HbA1c POC (<> result, manual entry)     HbA1c, POC (prediabetic range)     HbA1c, POC (controlled diabetic range)     Assessment & Plan:  This visit occurred during the SARS-CoV-2 public health emergency.  Safety protocols were in place, including screening questions prior to the visit,  additional usage of staff PPE, and extensive cleaning of exam room while observing appropriate contact time as indicated for disinfecting solutions.   Problem List Items Addressed This Visit    Type 2 diabetes mellitus with other specified complication (HCC) (Chronic)    Poor control. Has not yet started higher metformin  dose, unsure if taking glimepiride as wife manages medications.       Status post liver transplant ([place]) - Primary (Chronic)   Relevant Medications   predniSONE (DELTASONE) 20 MG tablet   Dyslipidemia associated with type 2 diabetes mellitus ([place])    States liver clinic recently recommended daily statin - I have not received records.       Chronic gout    Has been well managed with daily allopurinol to date - without recent missed dose.  He does endorse increased sodium in diet over weekend.  See above for possible gout flare.       Acute pain of left foot    In h/o gout and similar presentation in the past, anticipate recurrent gout attack, less likely foot tendonitis vs other cause. rec start topical voltaren gel TID to foot, and if ineffective may fill prednisone 7d taper. Reviewed hyperglycemia precautions while on prednisone.       (HFpEF) heart failure with preserved ejection fraction ([place])    Saw cards, started lasix 42m daily.           Meds ordered this encounter  Medications  . predniSONE (DELTASONE) 20 MG tablet    Sig: Take two tablets daily for 3 days followed by one tablet daily for 4 days    Dispense:  10 tablet    Refill:  0  . DISCONTD: diclofenac Sodium (VOLTAREN) 1 % GEL    Sig: Apply 2 g topically 3 (three) times daily.    Dispense:  100 g  . diclofenac Sodium (VOLTAREN) 1 % GEL    Sig: Apply 2 g topically in the morning and at bedtime.    Dispense:  100 g   No orders of the defined types were placed in this encounter.   Patient Instructions  This could be beginnings of gout or possible foot tendonitis from prolonged flip flop use.   Treat with prednisone taper sent to pharmacy. May also use voltaren (diclofenac) gel topical anti inflammatory medication which is now over the counter - may use 1-2 times a day.    Follow up plan: No follow-ups on file.  JRia Bush MD

## 2020-01-01 NOTE — Assessment & Plan Note (Signed)
Has been well managed with daily allopurinol to date - without recent missed dose.  He does endorse increased sodium in diet over weekend.  See above for possible gout flare.

## 2020-01-01 NOTE — Assessment & Plan Note (Signed)
States liver clinic recently recommended daily statin - I have not received records.

## 2020-01-01 NOTE — Assessment & Plan Note (Signed)
Poor control. Has not yet started higher metformin dose, unsure if taking glimepiride as wife manages medications.

## 2020-01-02 DIAGNOSIS — Z79899 Other long term (current) drug therapy: Secondary | ICD-10-CM | POA: Diagnosis not present

## 2020-01-02 DIAGNOSIS — Z48298 Encounter for aftercare following other organ transplant: Secondary | ICD-10-CM | POA: Diagnosis not present

## 2020-01-02 DIAGNOSIS — Z944 Liver transplant status: Secondary | ICD-10-CM | POA: Diagnosis not present

## 2020-01-06 ENCOUNTER — Other Ambulatory Visit: Payer: Self-pay | Admitting: Family Medicine

## 2020-01-06 DIAGNOSIS — G4733 Obstructive sleep apnea (adult) (pediatric): Secondary | ICD-10-CM | POA: Diagnosis not present

## 2020-01-08 ENCOUNTER — Ambulatory Visit: Payer: Medicare HMO

## 2020-01-08 ENCOUNTER — Other Ambulatory Visit: Payer: Self-pay

## 2020-01-08 DIAGNOSIS — E1169 Type 2 diabetes mellitus with other specified complication: Secondary | ICD-10-CM

## 2020-01-08 DIAGNOSIS — I10 Essential (primary) hypertension: Secondary | ICD-10-CM

## 2020-01-08 NOTE — Chronic Care Management (AMB) (Signed)
Chronic Care Management Pharmacy  Name: Colton Porter  MRN: [id number] DOB: Jun 13, 1966  Chief Complaint/ HPI  [name],  54 y.o., male presents for their Follow-Up CCM visit with the clinical pharmacist via telephone.  PCP : Ria Bush, MD   Their chronic conditions include: hypertension, heart failure, orthostatic hypotension, allergic rhinitis, type 2 diabetes, dyslipidemia, gout, depression, alcohol cirrhosis/status post liver transplant, portal hypertensive gastropathy, neuropathy, CKD  Last CCM visit 12/03/19: started metformin 500 mg per PCP consult, increase to BID after 1 week if tolerated   Office visits/Consults:  01/01/20: PCP visit - DM, has not started higher dose metformin yet, unsure if taking glimepiride, on chronic prednisone s/p liver transplant; liver clinic recommended daily statin, chronic gout with possible gout flare today, restart voltaren gel TID, if ineffective may fill prednisone 7 day taper, HFpEF, saw cardio and started lasix 40 mg daily   12/06/19: Cardiology - denies LLE, complaint to CPAP, denies SOB and chat pain; mild LLE on exam, restart Lasix 40 mg daily, HTN uncontrolled, increase losartan to 100 mg daily, obesity, goal of 50 lb weight loss, not on statin due to liver transplant   Current Outpatient Medications on File Prior to Visit  Medication Sig Dispense Refill   Accu-Chek FastClix Lancets MISC Use three times daily to check sugars E65.11, insulin use 100 each 12   acetaminophen (TYLENOL) 500 MG tablet Take 500 mg by mouth every 6 (six) hours as needed for mild pain.     allopurinol (ZYLOPRIM) 300 MG tablet TAKE 1 TABLET BY MOUTH DAILY. 90 tablet 0   ASPIR-LOW 81 MG EC tablet Take 1 tablet (81 mg total) by mouth daily. 90 tablet 3   Blood Glucose Monitoring Suppl (ACCU-CHEK GUIDE) w/Device KIT 1 Units by Does not apply route as directed. 1 kit 0   Cholecalciferol (VITAMIN D3) 2000 units TABS Take 1 tablet by mouth at bedtime.      diclofenac Sodium (VOLTAREN) 1 % GEL Apply 2 g topically in the morning and at bedtime. 100 g    docusate sodium (COLACE) 100 MG capsule Take 100 mg by mouth daily as needed for mild constipation.      escitalopram (LEXAPRO) 10 MG tablet TAKE 1 TABLET BY MOUTH DAILY. 90 tablet 0   fluticasone (FLONASE) 50 MCG/ACT nasal spray SPRAY 2 SPRAYS INTO EACH NOSTRIL EVERY DAY 48 mL 0   furosemide (LASIX) 40 MG tablet Take 1 tablet (40 mg total) by mouth daily. 90 tablet 3   glimepiride (AMARYL) 2 MG tablet TAKE 1 TABLET BY MOUTH DAILY WITH BREAKFAST 90 tablet 0   glucose blood (ACCU-CHEK GUIDE) test strip Check sugars three times daily and as needed when feeling ill E11.65, insulin use 100 each 11   glucose blood test strip Use as instructed to check sugars three times daily E11.65, insulin use 100 each 12   insulin glargine (LANTUS SOLOSTAR) 100 UNIT/ML Solostar Pen Inject 40 Units into the skin daily. 45 mL 3   Insulin Pen Needle (B-D UF III MINI PEN NEEDLES) 31G X 5 MM MISC USE TO INJECT INSULIN DAILY 100 each 3   loratadine (CLARITIN) 10 MG tablet Take 10 mg by mouth daily.     losartan (COZAAR) 100 MG tablet Take 1 tablet (100 mg total) by mouth daily. 90 tablet 3   losartan (COZAAR) 50 MG tablet TAKE 1 TABLET EVERY DAY 90 tablet 0   lovastatin (MEVACOR) 20 MG tablet Take 1 tablet (20 mg total) by  mouth once a week. 13 tablet 3   metFORMIN (GLUCOPHAGE) 500 MG tablet Take 1 tablet (500 mg total) by mouth daily with breakfast for 7 days, THEN 1 tablet (500 mg total) 2 (two) times daily with a meal. 180 tablet 1   Multiple Vitamins-Minerals ([employer]S SPECTRAVITE PO) Take 1 tablet by mouth at bedtime.     mycophenolate (MYFORTIC) 360 MG TBEC EC tablet      Omega-3 Fatty Acids (FISH OIL PO) Take 1 tablet by mouth at bedtime.     omeprazole (PRILOSEC) 40 MG capsule TAKE 1 CAPSULE BY MOUTH DAILY. 90 capsule 0   predniSONE (DELTASONE) 20 MG tablet Take two tablets daily for 3 days followed  by one tablet daily for 4 days 10 tablet 0   tacrolimus (PROGRAF) 1 MG capsule Take 1-2 mg by mouth See admin instructions. Take 2 mg by mouth in the morning, then take 1 mg  by mouth in the evening     No current facility-administered medications on file prior to visit.   Goals     Patient Stated     Starting 10/15/18, I will continue to take medications as prescribed.      Patient Stated     01/22/2020, I will maintain and continue medications as prescribed.      Pharmacy Care Plan     CARE PLAN ENTRY (see longitudinal plan of care for additional care plan information)  Current Barriers:   Chronic Disease Management support, education, and care coordination needs related to Hypertension and Diabetes   Hypertension BP Readings from Last 3 Encounters:  01/01/20 136/80  12/06/19 (!) 142/86  10/23/19 140/78   Pharmacist Clinical Goal(s): o Over the next 6 weeks, patient will work with PharmD and providers to maintain BP goal <140/90 mmHg  Current regimen:  o Losartan 100 mg - 1 tablet daily   Interventions: o Comprehensive medication review  Patient self care activities - Over the next 6 weeks, patient will: o Check blood pressure 7 days prior to appointments o Ensure daily salt intake < 2300 mg/day  Diabetes Lab Results  Component Value Date/Time   HGBA1C 10.6 (H) 01/22/2020 07:28 AM   HGBA1C 9.8 (A) 10/23/2019 09:21 AM   HGBA1C 10.1 (H) 07/18/2019 11:33 AM   Pharmacist Clinical Goal(s): o Over the next 6 weeks, patient will work with PharmD and providers to achieve A1c goal <7%  Current regimen:   Glimepiride 2 mg - 1 tablet daily with breakfast  Lantus - Inject 40 units daily   Metformin 500 mg - 1 tablet twice daily   Interventions: o Recommend switching metformin to ER formulation and take with food to decrease upset stomach o Discussed retrial of Ozempic   Patient self care activities - Over the next 6 weeks, patient will: o Check blood sugar in the  morning before eating or drinking, document, and provide at future appointments o Contact provider with any episodes of hypoglycemia o Review carbohydrate counting handout  Vaccinations: Remain up to date on vaccinations. Recommend 2 doses of the Shingrix vaccine.   Please see past updates related to this goal by clicking on the "Past Updates" button in the selected goal       Diabetes   CMP Latest Ref Rng & Units 08/02/2019 08/01/2019 07/31/2019  Glucose 70 - 99 mg/dL 184(H) 227(H) 277(H)  BUN 6 - 20 mg/dL 16 22(H) 32(H)  Creatinine 0.61 - 1.24 mg/dL 1.18 1.21 1.60(H)  Sodium 135 - 145 mmol/L 138 136 134(L)  Potassium 3.5 - 5.1 mmol/L 3.8 3.4(L) 3.7  Chloride 98 - 111 mmol/L 107 103 103  CO2 22 - 32 mmol/L '24 23 22  '$ Calcium 8.9 - 10.3 mg/dL 8.4(L) 8.5(L) 8.1(L)  Total Protein 6.5 - 8.1 g/dL - - 5.8(L)  Total Bilirubin 0.3 - 1.2 mg/dL - - 0.9  Alkaline Phos 38 - 126 U/L - - 70  AST 15 - 41 U/L - - 14(L)  ALT 0 - 44 U/L - - 22   Recent Relevant Labs: Lab Results  Component Value Date/Time   HGBA1C 9.8 (A) 10/23/2019 09:21 AM   HGBA1C 10.1 (H) 07/18/2019 11:33 AM   HGBA1C 7.7 (A) 04/23/2019 08:12 AM   HGBA1C 7.8 (H) 10/15/2018 09:29 AM   MICROALBUR <0.7 08/27/2014 09:51 AM   A1c goal < 7%  Fasting goal: 80-130 Post-prandial goal: < 180   Checking BG: 2 days per week, before breakfast, does not like checking BG, unable to afford Libre Fasting blood glucose: 296 (this morning), lowest 228 in the morning  Hypoglycemia: denies   Patient has failed these meds in past: Ozempic (nausea/diarrhea) Patient is currently uncontrolled on the following medications:   Glimepiride 2 mg - 1 tablet daily with breakfast  Lantus - Inject 40 units daily   Metformin 500 mg - 1 tablet twice daily (pt taking before breakfast and bedtime)  We discussed: Reports going to the bathroom 5-6 times a day since starting metformin, has been difficult with work, reports tolerating worse than he did  Ozempic. He is not taking with food.  Recommended trying with food. Also will try switching to ER formulation. He is also open to trying Ozempic again as he reports it was milder on his stomach than metformin.   Assessment: Fasting BG still elevated, no notable improvement since starting metformin.   Diet: reports trying to improve diet since he saw liver specialist on 12/27/19, concerned about developing fatty liver, they increased his statin from weekly to daily. He is cutting back on breads. Does not limit carbs/meal. Discussed carb counting. Reports blurred vision due to high blood glucose. He is still having ice cream often.  Last diabetic eye exam: due for annual exam Last diabetic foot exam: updated 10/23/19 by PCP  Plan: Continue current medications; Try switching metformin to ER formulation. Take with food. Consult PCP to retrial Ozempic. Send carbohydrate counting handout.   Hypertension   CMP Latest Ref Rng & Units 08/02/2019 08/01/2019 07/31/2019  Glucose 70 - 99 mg/dL 184(H) 227(H) 277(H)  BUN 6 - 20 mg/dL 16 22(H) 32(H)  Creatinine 0.61 - 1.24 mg/dL 1.18 1.21 1.60(H)  Sodium 135 - 145 mmol/L 138 136 134(L)  Potassium 3.5 - 5.1 mmol/L 3.8 3.4(L) 3.7  Chloride 98 - 111 mmol/L 107 103 103  CO2 22 - 32 mmol/L '24 23 22  '$ Calcium 8.9 - 10.3 mg/dL 8.4(L) 8.5(L) 8.1(L)  Total Protein 6.5 - 8.1 g/dL - - 5.8(L)  Total Bilirubin 0.3 - 1.2 mg/dL - - 0.9  Alkaline Phos 38 - 126 U/L - - 70  AST 15 - 41 U/L - - 14(L)  ALT 0 - 44 U/L - - 22   Office blood pressures are: BP Readings from Last 3 Encounters:  01/01/20 136/80  12/06/19 (!) 142/86  10/23/19 140/78   Patient has failed these meds in the past: doxazosin and amlodipine (no problems, d/c during hospitalization 07/2019) Patient checks BP at home: not checking  BP goal < 140/90 mmHg Patient is currently controlled on  the following medications:   Losartan 100 mg - 1 tablet daily   We discussed: Losartan increased per cardio  12/06/19, pt has not checked home BP since dose change  Plan: Continue current medications; Check BP 7 days prior to each appointment.  Hyperlipidemia   LDL goal < 100  Lipid Panel     Component Value Date/Time   CHOL 92 10/15/2018 0929   TRIG 128 07/18/2019 1137   HDL 26.10 (L) 10/15/2018 0929   LDLCALC 44 10/06/2017 1017   LDLDIRECT 43.0 10/15/2018 0929     Patient has failed these meds in past: none  Patient is currently on the following medications:   Lovastatin 20 mg - 1 tablet daily (increased from weekly to daily July 2021 per liver specialist)  We discussed: doing well on daily dose so far  Plan: Continue current medications  CCM Follow Up: 6 weeks (telephone)  Debbora Dus, PharmD Clinical Pharmacist [facility] [phone]

## 2020-01-22 ENCOUNTER — Other Ambulatory Visit: Payer: Self-pay

## 2020-01-22 ENCOUNTER — Other Ambulatory Visit: Payer: Self-pay | Admitting: Family Medicine

## 2020-01-22 ENCOUNTER — Ambulatory Visit: Payer: Medicare HMO | Admitting: Family Medicine

## 2020-01-22 ENCOUNTER — Other Ambulatory Visit (INDEPENDENT_AMBULATORY_CARE_PROVIDER_SITE_OTHER): Payer: Medicare HMO

## 2020-01-22 ENCOUNTER — Ambulatory Visit (INDEPENDENT_AMBULATORY_CARE_PROVIDER_SITE_OTHER): Payer: Medicare HMO

## 2020-01-22 DIAGNOSIS — N183 Chronic kidney disease, stage 3 unspecified: Secondary | ICD-10-CM

## 2020-01-22 DIAGNOSIS — Z0000 Encounter for general adult medical examination without abnormal findings: Secondary | ICD-10-CM | POA: Diagnosis not present

## 2020-01-22 DIAGNOSIS — E785 Hyperlipidemia, unspecified: Secondary | ICD-10-CM | POA: Diagnosis not present

## 2020-01-22 DIAGNOSIS — Z125 Encounter for screening for malignant neoplasm of prostate: Secondary | ICD-10-CM

## 2020-01-22 DIAGNOSIS — D696 Thrombocytopenia, unspecified: Secondary | ICD-10-CM | POA: Diagnosis not present

## 2020-01-22 DIAGNOSIS — M1A0790 Idiopathic chronic gout, unspecified ankle and foot, without tophus (tophi): Secondary | ICD-10-CM | POA: Diagnosis not present

## 2020-01-22 DIAGNOSIS — Z794 Long term (current) use of insulin: Secondary | ICD-10-CM | POA: Diagnosis not present

## 2020-01-22 DIAGNOSIS — E1169 Type 2 diabetes mellitus with other specified complication: Secondary | ICD-10-CM | POA: Diagnosis not present

## 2020-01-22 LAB — COMPREHENSIVE METABOLIC PANEL
ALT: 43 U/L (ref 0–53)
AST: 24 U/L (ref 0–37)
Albumin: 4 g/dL (ref 3.5–5.2)
Alkaline Phosphatase: 79 U/L (ref 39–117)
BUN: 18 mg/dL (ref 6–23)
CO2: 27 mEq/L (ref 19–32)
Calcium: 9.2 mg/dL (ref 8.4–10.5)
Chloride: 101 mEq/L (ref 96–112)
Creatinine, Ser: 1.3 mg/dL (ref 0.40–1.50)
GFR: 57.67 mL/min — ABNORMAL LOW (ref >60.00)
Glucose, Bld: 281 mg/dL — ABNORMAL HIGH (ref 70–99)
Potassium: 4.4 mEq/L (ref 3.5–5.1)
Sodium: 135 mEq/L (ref 135–145)
Total Bilirubin: 0.6 mg/dL (ref 0.2–1.2)
Total Protein: 7 g/dL (ref 6.0–8.3)

## 2020-01-22 LAB — LIPID PANEL
Cholesterol: 94 mg/dL (ref 0–200)
HDL: 26.2 mg/dL — ABNORMAL LOW (ref >39.00)
Total CHOL/HDL Ratio: 4
Triglycerides: 508 mg/dL — ABNORMAL HIGH (ref 0.0–149.0)

## 2020-01-22 LAB — CBC WITH DIFFERENTIAL/PLATELET
Basophils Absolute: 0.1 10*3/uL (ref 0.0–0.1)
Basophils Relative: 0.9 % (ref 0.0–3.0)
Eosinophils Absolute: 0.2 10*3/uL (ref 0.0–0.7)
Eosinophils Relative: 2.5 % (ref 0.0–5.0)
HCT: 37.2 % — ABNORMAL LOW (ref 39.0–52.0)
Hemoglobin: 12.8 g/dL — ABNORMAL LOW (ref 13.0–17.0)
Lymphocytes Relative: 25.7 % (ref 12.0–46.0)
Lymphs Abs: 1.6 10*3/uL (ref 0.7–4.0)
MCHC: 34.3 g/dL (ref 30.0–36.0)
MCV: 86.3 fl (ref 78.0–100.0)
Monocytes Absolute: 0.4 10*3/uL (ref 0.1–1.0)
Monocytes Relative: 6.3 % (ref 3.0–12.0)
Neutro Abs: 4 10*3/uL (ref 1.4–7.7)
Neutrophils Relative %: 64.6 % (ref 43.0–77.0)
Platelets: 133 10*3/uL — ABNORMAL LOW (ref 150.0–400.0)
RBC: 4.31 Mil/uL (ref 4.22–5.81)
RDW: 14.7 % (ref 11.5–15.5)
WBC: 6.2 10*3/uL (ref 4.0–10.5)

## 2020-01-22 LAB — URIC ACID: Uric Acid, Serum: 5.9 mg/dL (ref 4.0–7.8)

## 2020-01-22 LAB — VITAMIN D 25 HYDROXY (VIT D DEFICIENCY, FRACTURES): VITD: 38.35 ng/mL (ref 30.00–100.00)

## 2020-01-22 LAB — LDL CHOLESTEROL, DIRECT: Direct LDL: 35 mg/dL

## 2020-01-22 LAB — MAGNESIUM: Magnesium: 1.3 mg/dL — ABNORMAL LOW (ref 1.5–2.5)

## 2020-01-22 LAB — HEMOGLOBIN A1C: Hgb A1c MFr Bld: 10.6 % — ABNORMAL HIGH (ref 4.6–6.5)

## 2020-01-22 LAB — PSA: PSA: 8.22 ng/mL — ABNORMAL HIGH (ref 0.10–4.00)

## 2020-01-22 NOTE — Progress Notes (Signed)
PCP notes:  Health Maintenance: Eye exam- due   Abnormal Screenings: none   Patient concerns: Diarrhea since starting metformin Back pain    Nurse concerns: none   Next PCP appt.: 02/06/2020 @ 8:30 am

## 2020-01-22 NOTE — Progress Notes (Signed)
Subjective:   Colton Porter is a 54 y.o. male who presents for Medicare Annual/Subsequent preventive examination.  Review of Systems: N/A      I connected with the patient today by telephone and verified that I am speaking with the correct person using two identifiers. Location patient: home Location nurse: work Persons participating in the virtual visit: patient, Marine scientist.   I discussed the limitations, risks, security and privacy concerns of performing an evaluation and management service by telephone and the availability of in person appointments. I also discussed with the patient that there may be a patient responsible charge related to this service. The patient expressed understanding and verbally consented to this telephonic visit.    Interactive audio and video telecommunications were attempted between this nurse and patient, however failed, due to patient having technical difficulties OR patient did not have access to video capability.  We continued and completed visit with audio only.     Cardiac Risk Factors include: diabetes mellitus;dyslipidemia;male gender     Objective:    Today's Vitals   There is no height or weight on file to calculate BMI.  Advanced Directives 01/22/2020 08/01/2019 07/30/2019 07/18/2019 07/17/2019 07/15/2019 07/12/2019  Does Patient Have a Medical Advance Directive? _0  No No  Does patient want to make changes to medical advance directive? - No - Patient declined - - - - -  Would patient like information on creating a medical advance directive? No - Patient declined No - Patient declined - No - Patient declined No - Patient declined - No - Patient declined  Pre-existing out of facility DNR order (yellow form or pink MOST form) - - - - - - -    Current Medications (verified) Outpatient Encounter Medications as of 01/22/2020  Medication Sig  . Accu-Chek FastClix Lancets MISC Use three times daily to check sugars E65.11, insulin use  .  acetaminophen (TYLENOL) 500 MG tablet Take 500 mg by mouth every 6 (six) hours as needed for mild pain.  Marland Kitchen allopurinol (ZYLOPRIM) 300 MG tablet TAKE 1 TABLET BY MOUTH DAILY.  Marland Kitchen ASPIR-LOW 81 MG EC tablet Take 1 tablet (81 mg total) by mouth daily.  . Blood Glucose Monitoring Suppl (ACCU-CHEK GUIDE) w/Device KIT 1 Units by Does not apply route as directed.  . Cholecalciferol (VITAMIN D3) 2000 units TABS Take 1 tablet by mouth at bedtime.  . diclofenac Sodium (VOLTAREN) 1 % GEL Apply 2 g topically in the morning and at bedtime.  . docusate sodium (COLACE) 100 MG capsule Take 100 mg by mouth daily as needed for mild constipation.   Marland Kitchen escitalopram (LEXAPRO) 10 MG tablet TAKE 1 TABLET BY MOUTH DAILY.  . fluticasone (FLONASE) 50 MCG/ACT nasal spray SPRAY 2 SPRAYS INTO EACH NOSTRIL EVERY DAY  . furosemide (LASIX) 40 MG tablet Take 1 tablet (40 mg total) by mouth daily.  Marland Kitchen glimepiride (AMARYL) 2 MG tablet TAKE 1 TABLET BY MOUTH DAILY WITH BREAKFAST  . glucose blood (ACCU-CHEK GUIDE) test strip Check sugars three times daily and as needed when feeling ill E11.65, insulin use  . glucose blood test strip Use as instructed to check sugars three times daily E11.65, insulin use  . insulin glargine (LANTUS SOLOSTAR) 100 UNIT/ML Solostar Pen Inject 40 Units into the skin daily.  . Insulin Pen Needle (B-D UF III MINI PEN NEEDLES) 31G X 5 MM MISC USE TO INJECT INSULIN DAILY  . loratadine (CLARITIN) 10 MG tablet Take 10 mg by mouth daily.  Marland Kitchen  losartan (COZAAR) 100 MG tablet Take 1 tablet (100 mg total) by mouth daily.  Marland Kitchen losartan (COZAAR) 50 MG tablet TAKE 1 TABLET EVERY DAY  . lovastatin (MEVACOR) 20 MG tablet Take 1 tablet (20 mg total) by mouth once a week.  . Multiple Vitamins-Minerals ([employer] SPECTRAVITE PO) Take 1 tablet by mouth at bedtime.  . mycophenolate (MYFORTIC) 360 MG TBEC EC tablet   . Omega-3 Fatty Acids (FISH OIL PO) Take 1 tablet by mouth at bedtime.  Marland Kitchen omeprazole (PRILOSEC) 40 MG capsule TAKE 1  CAPSULE BY MOUTH DAILY.  Marland Kitchen predniSONE (DELTASONE) 20 MG tablet Take two tablets daily for 3 days followed by one tablet daily for 4 days  . tacrolimus (PROGRAF) 1 MG capsule Take 1-2 mg by mouth See admin instructions. Take 2 mg by mouth in the morning, then take 1 mg  by mouth in the evening  . metFORMIN (GLUCOPHAGE) 500 MG tablet Take 1 tablet (500 mg total) by mouth daily with breakfast for 7 days, THEN 1 tablet (500 mg total) 2 (two) times daily with a meal.   No facility-administered encounter medications on file as of 01/22/2020.    Allergies (verified) Tolmetin, Ambien [zolpidem tartrate], Morphine and related, Hydrochlorothiazide w-triamterene, and Lisinopril   History: Past Medical History:  Diagnosis Date  . Alcohol dependence ([place]) 07/11/2012  . Alcoholic cirrhosis of liver with ascites ([place]) 07/2014   s./p transplant 12/2015  . Allergy   . Anemia   . Cellulitis of left leg   . Chronic diastolic heart failure ([place]) 11/01/2013  . CKD (chronic kidney disease) stage 3, GFR 30-59 ml/min 11/04/2015  . Diabetes mellitus without complication ([place])   . GERD (gastroesophageal reflux disease)   . Hyperlipidemia   . Hypertension   . Neuropathy   . OSA on CPAP 07/11/2012   HST 07/2013:  AHI 39/hr.    . Pneumonia due to COVID-19 virus 06/2019  . Thrombocytopenia ([place]) 12/15/2011   Past Surgical History:  Procedure Laterality Date  . COLONOSCOPY  08/2013   hyperplastic polyps, hemorrhoids Ardis Hughs)  . 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 FINGER AMPUTATION  1997   left 4th finger - radial arm saw  . LEFT AND RIGHT HEART CATHETERIZATION WITH CORONARY ANGIOGRAM N/A 06/10/2013   Procedure: LEFT AND RIGHT HEART CATHETERIZATION WITH CORONARY ANGIOGRAM;  Surgeon: Blane Ohara, MD;  Location: [facility] CATH LAB;  Service: Cardiovascular;  Laterality: N/A;  . LIVER TRANSPLANTATION  23/5573   alcoholic cirrhosis ([facility] at [facility])   Family History    Problem Relation Age of Onset  . Stroke Mother   . Emphysema Mother   . Hypertension Father   . Heart disease Father   . Emphysema Father   . Lung cancer Maternal Aunt   . Stroke Paternal Grandmother   . Heart attack Neg Hx    Social History   Socioeconomic History  . Marital status: Married    Spouse name: Not on file  . Number of children: 2  . Years of education: Not on file  . Highest education level: Not on file  Occupational History  . Occupation: Investment banker, corporate    Comment: no work lately  Tobacco Use  . Smoking status: Never Smoker  . Smokeless tobacco: Former Systems developer    Types: Secondary school teacher  . Vaping Use: Never used  Substance and Sexual Activity  . Alcohol use: No    Alcohol/week: 0.0 standard drinks    Comment:  4-6 drinks daily - NO ETOH SINCE APRIL 2016  . Drug use: Not Currently    Comment: remote use of marijuana in the past, has since quit  . Sexual activity: Not on file  Other Topics Concern  . Not on file  Social History Narrative    Lives with wife   Occupation: full disability after cirrhosis dx, some working    Activity: some yardwork   Diet: good water, good fruit intake, lots of red meats   Social Determinants of Health   Financial Resource Strain: Low Risk   . Difficulty of Paying Living Expenses: Not hard at all  Food Insecurity: No Food Insecurity  . Worried About Charity fundraiser in the Last Year: Never true  . Ran Out of Food in the Last Year: Never true  Transportation Needs: No Transportation Needs  . Lack of Transportation (Medical): No  . Lack of Transportation (Non-Medical): No  Physical Activity: Inactive  . Days of Exercise per Week: 0 days  . Minutes of Exercise per Session: 0 min  Stress: No Stress Concern Present  . Feeling of Stress : Not at all  Social Connections:   . Frequency of Communication with Friends and Family:   . Frequency of Social Gatherings with Friends and Family:   . Attends Religious Services:    . Active Member of Clubs or Organizations:   . Attends Archivist Meetings:   Marland Kitchen Marital Status:     Tobacco Counseling Counseling given: Not Answered   Clinical Intake:  Pre-visit preparation completed: Yes  Pain : No/denies pain     Nutritional Risks: Nausea/ vomitting/ diarrhea (diarrhea due to metformin) Diabetes: Yes CBG done?: No Did pt. bring in CBG monitor from home?: No  How often do you need to have someone help you when you read instructions, pamphlets, or other written materials from your doctor or pharmacy?: 1 - Never What is the last grade level you completed in school?: 12th  Diabetic: Yes Nutrition Risk Assessment:  Has the patient had any N/V/D within the last 2 months?  Yes, diarrhea Does the patient have any non-healing wounds?  No  Has the patient had any unintentional weight loss or weight gain?  No   Diabetes:  Is the patient diabetic?  Yes  If diabetic, was a CBG obtained today?  No  Did the patient bring in their glucometer from home?  No  How often do you monitor your CBG's? When needed.   Financial Strains and Diabetes Management:  Are you having any financial strains with the device, your supplies or your medication? no.  Does the patient want to be seen by Chronic Care Management for management of their diabetes?  No  Would the patient like to be referred to a Nutritionist or for Diabetic Management?  No   Diabetic Exams:  Diabetic Eye Exam: Overdue for diabetic eye exam. Pt has been advised about the importance in completing this exam. Patient advised to call and schedule an eye exam. Diabetic Foot Exam: Completed 10/23/2019   Interpreter Needed?: No  Information entered by :: [place], LPN   Activities of Daily Living In your present state of health, do you have any difficulty performing the following activities: 01/22/2020 08/01/2019  Hearing? N N  Vision? N N  Difficulty concentrating or making decisions? Y N  Comment  memory loss since COVID -  Walking or climbing stairs? N Y  Comment - -  Dressing or bathing? Aggie Moats  Comment - -  Doing errands, shopping? N N  Preparing Food and eating ? N -  Using the Toilet? N -  In the past six months, have you accidently leaked urine? N -  Do you have problems with loss of bowel control? Y -  Comment since starting metformin -  Managing your Medications? N -  Managing your Finances? N -  Housekeeping or managing your Housekeeping? N -  Some recent data might be hidden    Patient Care Team: Ria Bush, MD as PCP - General (Family Medicine) Debbora Dus, [facility] as Pharmacist (Pharmacist)  Indicate any recent Medical Services you may have received from other than [facility] providers in the past year (date may be approximate).     Assessment:   This is a routine wellness examination for [place].  Hearing/Vision screen  Hearing Screening   _0  _1  _2  _3  _4  _5  _6  _7  _8   Right ear:           Left ear:           Vision Screening Comments: Patient gets annual eye exams   Dietary issues and exercise activities discussed: Current Exercise Habits: The patient does not participate in regular exercise at present, Exercise limited by: None identified  Goals    . Patient Stated     Starting 10/15/18, I will continue to take medications as prescribed.     . Patient Stated     01/22/2020, I will maintain and continue medications as prescribed.     . Pharmacy Care Plan     Current Barriers:  . Chronic Disease Management support, education, and care coordination needs related to hypertension, type 2 diabetes  Pharmacist Clinical Goal(s):  Marland Kitchen Diabetes: Achieve A1c goal of < 7% and prevent adverse outcomes of elevated blood glucose.  Marland Kitchen Hypertension: Maintain blood pressure within goal of less than 140/90 mmHg.  . Vaccinations: Remain up to date on vaccinations. Recommend 2 doses of the Shingrix vaccine.   Interventions: . Comprehensive  medication review performed. Geanie Cooley PCP for medication management  Patient Self Care Activities:  . Check blood sugar 3-4 days per week before breakfast . Continue Lantus 40 units daily and glimepiride 2 mg daily with breakfast. Start metformin 500 mg daily with breakfast x 7 days, then increase to twice daily with meal if tolerated; Call if any concerns. . Call if any low blood sugar (less than 70 mg/dL)  . Continue to avoid ice cream and sweets.  . Continue to check blood pressure once weekly in the morning before medications. If elevated, re-check after sitting for 2-3 minutes. Watch for swelling and take furosemide as needed if swelling occurs.   Please see past updates related to this goal by clicking on the "Past Updates" button in the selected goal       Depression Screen PHQ 2/9 Scores 01/22/2020 10/15/2018 10/06/2017 09/11/2017  PHQ - 2 Score 0 0 0 0  PHQ- 9 Score 0 0 0 -    Fall Risk Fall Risk  01/22/2020 10/15/2018 10/06/2017 09/11/2017  Falls in the past year? 1 0 No No  Comment passed out - - -  Number falls in past yr: 0 - - -  Injury with Fall? 1 - - -  Comment broke rib - - -  Risk for fall due to : Medication side effect;Impaired balance/gait - - -  Follow up Falls evaluation completed;Falls prevention discussed - - -    Any stairs in or around the  home? Yes  If so, are there any without handrails? No  Home free of loose throw rugs in walkways, pet beds, electrical cords, etc? Yes  Adequate lighting in your home to reduce risk of falls? Yes   ASSISTIVE DEVICES UTILIZED TO PREVENT FALLS:  Life alert? No  Use of a cane, walker or w/c? No  Grab bars in the bathroom? No  Shower chair or bench in shower? No  Elevated toilet seat or a handicapped toilet? No   TIMED UP AND GO:  Was the test performed? N/A, telephonic visit.  Cognitive Function: MMSE - Mini Mental State Exam 01/22/2020 10/15/2018 10/06/2017  Not completed: Refused - -  Orientation to time - 5 5    Orientation to Place - 5 5  Registration - 3 3  Attention/ Calculation - 0 0  Recall - 3 3  Language- name 2 objects - 0 0  Language- repeat - 1 1  Language- follow 3 step command - 0 3  Language- read & follow direction - 0 0  Write a sentence - 0 0  Copy design - 0 0  Total score - 17 20  Mini Cog  Mini-Cog screen was not completed. Patient refused. Maximum score is 22. A value of 0 denotes this part of the MMSE was not completed or the patient failed this part of the Mini-Cog screening.       Immunizations Immunization History  Administered Date(s) Administered  . Hep A / Hep B 01/16/2015, 01/23/2015, 02/06/2015  . Hepatitis B, adult 12/04/2015  . Influenza Whole 03/24/2010  . Influenza,inj,Quad PF,6+ Mos 04/22/2015, 04/06/2017, 04/23/2018, 04/23/2019  . PFIZER SARS-COV-2 Vaccination 11/07/2019, 11/28/2019  . Pneumococcal Polysaccharide-23 04/22/2015  . Td 05/01/2007  . Tdap 12/14/2015    TDAP status: Up to date Flu Vaccine status: Up to date Pneumococcal vaccine status: Up to date Covid-19 vaccine status: Completed vaccines  Qualifies for Shingles Vaccine? Yes   Zostavax completed No   Shingrix Completed?: No.    Education has been provided regarding the importance of this vaccine. Patient has been advised to call insurance company to determine out of pocket expense if they have not yet received this vaccine. Advised may also receive vaccine at local pharmacy or Health Dept. Verbalized acceptance and understanding.  Screening Tests Health Maintenance  Topic Date Due  . DTAP VACCINES (1) 11/14/1965  . OPHTHALMOLOGY EXAM  07/13/2018  . INFLUENZA VACCINE  01/26/2020  . HEMOGLOBIN A1C  07/24/2020  . FOOT EXAM  10/22/2020  . COLONOSCOPY  09/04/2023  . DTaP/Tdap/Td (3 - Td or Tdap) 12/13/2025  . TETANUS/TDAP  12/13/2025  . PNEUMOCOCCAL POLYSACCHARIDE VACCINE AGE 67-64 HIGH RISK  Completed  . COVID-19 Vaccine  Completed  . Hepatitis C Screening  Completed  . HIV  Screening  Completed    Health Maintenance  Health Maintenance Due  Topic Date Due  . DTAP VACCINES (1) 11[birth year]14/1965  . OPHTHALMOLOGY EXAM  07/13/2018    Colorectal cancer screening: Completed 09/03/2013. Repeat every 10 years  Lung Cancer Screening: (Low Dose CT Chest recommended if Age 79-80 years, 30 pack-year currently smoking OR have quit w/in 15years.) does not qualify.    Additional Screening:  Hepatitis C Screening: does qualify; Completed 09/16/2014  Vision Screening: Recommended annual ophthalmology exams for early detection of glaucoma and other disorders of the eye. Is the patient up to date with their annual eye exam?  No  Who is the provider or what is the name of the office in which  the patient attends annual eye exams? Does not see an eye doctor on a regular If pt is not established with a provider, would they like to be referred to a provider to establish care? No .   Dental Screening: Recommended annual dental exams for proper oral hygiene  Community Resource Referral / Chronic Care Management: CRR required this visit?  No   CCM required this visit?  No      Plan:     I have personally reviewed and noted the following in the patient's chart:   . Medical and social history . Use of alcohol, tobacco or illicit drugs  . Current medications and supplements . Functional ability and status . Nutritional status . Physical activity . Advanced directives . List of other physicians . Hospitalizations, surgeries, and ER visits in previous 12 months . Vitals . Screenings to include cognitive, depression, and falls . Referrals and appointments  In addition, I have reviewed and discussed with patient certain preventive protocols, quality metrics, and best practice recommendations. A written personalized care plan for preventive services as well as general preventive health recommendations were provided to patient.   Due to this being a telephonic visit, the after  visit summary with patients personalized plan was offered to patient via mail or my-chart.  Patient preferred to pick up at office at next visit.   Andrez Grime, LPN   [id number]

## 2020-01-22 NOTE — Patient Instructions (Signed)
[name] , Thank you for taking time to come for your Medicare Wellness Visit. I appreciate your ongoing commitment to your health goals. Please review the following plan we discussed and let me know if I can assist you in the future.   Screening recommendations/referrals: Colonoscopy: Up to date, completed 09/03/2013, due 08/2023 Recommended yearly ophthalmology/optometry visit for glaucoma screening and checkup Recommended yearly dental visit for hygiene and checkup  Vaccinations: Influenza vaccine: Up to date, completed 04/23/2019, due 01/2020 Pneumococcal vaccine: Up to date, completed 04/22/2015, due at age 42 Tdap vaccine: Up to date, completed 12/14/2015, due 11/2025 Shingles vaccine: due, check with your insurance regarding coverage options   Covid-19: Completed series  Advanced directives: Advance directive discussed with you today. Even though you declined this today please call our office should you change your mind and we can give you the proper paperwork for you to fill out.  Conditions/risks identified: Diabetes, Hyperlipidemia  Next appointment: Follow up in one year for your annual wellness visit.   Preventive Care 60-85 years old, Male Preventive care refers to lifestyle choices and visits with your health care provider that can promote health and wellness. What does preventive care include?  A yearly physical exam. This is also called an annual well check.  Dental exams once or twice a year.  Routine eye exams. Ask your health care provider how often you should have your eyes checked.  Personal lifestyle choices, including:  Daily care of your teeth and gums.  Regular physical activity.  Eating a healthy diet.  Avoiding tobacco and drug use.  Limiting alcohol use.  Practicing safe sex.  Taking low doses of aspirin every day.  Taking vitamin and mineral supplements as recommended by your health care provider. What happens during an annual well  check? The services and screenings done by your health care provider during your annual well check will depend on your age, overall health, lifestyle risk factors, and family history of disease. Counseling  Your health care provider may ask you questions about your:  Alcohol use.  Tobacco use.  Drug use.  Emotional well-being.  Home and relationship well-being.  Sexual activity.  Eating habits.  History of falls.  Memory and ability to understand (cognition).  Work and work Statistician. Screening  You may have the following tests or measurements:  Height, weight, and BMI.  Blood pressure.  Lipid and cholesterol levels. These may be checked every 5 years, or more frequently if you are over 77 years old.  Skin check.  Lung cancer screening. You may have this screening every year starting at age 9 if you have a 30-pack-year history of smoking and currently smoke or have quit within the past 15 years.  Fecal occult blood test (FOBT) of the stool. You may have this test every year starting at age 34.  Flexible sigmoidoscopy or colonoscopy. You may have a sigmoidoscopy every 5 years or a colonoscopy every 10 years starting at age 29.  Prostate cancer screening. Recommendations will vary depending on your family history and other risks.  Hepatitis C blood test.  Hepatitis B blood test.  Sexually transmitted disease (STD) testing.  Diabetes screening. This is done by checking your blood sugar (glucose) after you have not eaten for a while (fasting). You may have this done every 1-3 years.  Abdominal aortic aneurysm (AAA) screening. You may need this if you are a current or former smoker.  Osteoporosis. You may be screened starting at age 35 if you are  at high risk. Talk with your health care provider about your test results, treatment options, and if necessary, the need for more tests. Vaccines  Your health care provider may recommend certain vaccines, such  as:  Influenza vaccine. This is recommended every year.  Tetanus, diphtheria, and acellular pertussis (Tdap, Td) vaccine. You may need a Td booster every 10 years.  Zoster vaccine. You may need this after age 35.  Pneumococcal 13-valent conjugate (PCV13) vaccine. One dose is recommended after age 42.  Pneumococcal polysaccharide (PPSV23) vaccine. One dose is recommended after age 38. Talk to your health care provider about which screenings and vaccines you need and how often you need them. This information is not intended to replace advice given to you by your health care provider. Make sure you discuss any questions you have with your health care provider. Document Released: 07/10/2015 Document Revised: 03/02/2016 Document Reviewed: 04/14/2015 Elsevier Interactive Patient Education  2017 [place] Prevention in the Home Falls can cause injuries. They can happen to people of all ages. There are many things you can do to make your home safe and to help prevent falls. What can I do on the outside of my home?  Regularly fix the edges of walkways and driveways and fix any cracks.  Remove anything that might make you trip as you walk through a door, such as a raised step or threshold.  Trim any bushes or trees on the path to your home.  Use bright outdoor lighting.  Clear any walking paths of anything that might make someone trip, such as rocks or tools.  Regularly check to see if handrails are loose or broken. Make sure that both sides of any steps have handrails.  Any raised decks and porches should have guardrails on the edges.  Have any leaves, snow, or ice cleared regularly.  Use sand or salt on walking paths during winter.  Clean up any spills in your garage right away. This includes oil or grease spills. What can I do in the bathroom?  Use night lights.  Install grab bars by the toilet and in the tub and shower. Do not use towel bars as grab bars.  Use  non-skid mats or decals in the tub or shower.  If you need to sit down in the shower, use a plastic, non-slip stool.  Keep the floor dry. Clean up any water that spills on the floor as soon as it happens.  Remove soap buildup in the tub or shower regularly.  Attach bath mats securely with double-sided non-slip rug tape.  Do not have throw rugs and other things on the floor that can make you trip. What can I do in the bedroom?  Use night lights.  Make sure that you have a light by your bed that is easy to reach.  Do not use any sheets or blankets that are too big for your bed. They should not hang down onto the floor.  Have a firm chair that has side arms. You can use this for support while you get dressed.  Do not have throw rugs and other things on the floor that can make you trip. What can I do in the kitchen?  Clean up any spills right away.  Avoid walking on wet floors.  Keep items that you use a lot in easy-to-reach places.  If you need to reach something above you, use a strong step stool that has a grab bar.  Keep electrical cords out of  the way.  Do not use floor polish or wax that makes floors slippery. If you must use wax, use non-skid floor wax.  Do not have throw rugs and other things on the floor that can make you trip. What can I do with my stairs?  Do not leave any items on the stairs.  Make sure that there are handrails on both sides of the stairs and use them. Fix handrails that are broken or loose. Make sure that handrails are as long as the stairways.  Check any carpeting to make sure that it is firmly attached to the stairs. Fix any carpet that is loose or worn.  Avoid having throw rugs at the top or bottom of the stairs. If you do have throw rugs, attach them to the floor with carpet tape.  Make sure that you have a light switch at the top of the stairs and the bottom of the stairs. If you do not have them, ask someone to add them for you. What  else can I do to help prevent falls?  Wear shoes that:  Do not have high heels.  Have rubber bottoms.  Are comfortable and fit you well.  Are closed at the toe. Do not wear sandals.  If you use a stepladder:  Make sure that it is fully opened. Do not climb a closed stepladder.  Make sure that both sides of the stepladder are locked into place.  Ask someone to hold it for you, if possible.  Clearly mark and make sure that you can see:  Any grab bars or handrails.  First and last steps.  Where the edge of each step is.  Use tools that help you move around (mobility aids) if they are needed. These include:  Canes.  Walkers.  Scooters.  Crutches.  Turn on the lights when you go into a dark area. Replace any light bulbs as soon as they burn out.  Set up your furniture so you have a clear path. Avoid moving your furniture around.  If any of your floors are uneven, fix them.  If there are any pets around you, be aware of where they are.  Review your medicines with your doctor. Some medicines can make you feel dizzy. This can increase your chance of falling. Ask your doctor what other things that you can do to help prevent falls. This information is not intended to replace advice given to you by your health care provider. Make sure you discuss any questions you have with your health care provider. Document Released: 04/09/2009 Document Revised: 11/19/2015 Document Reviewed: 07/18/2014 Elsevier Interactive Patient Education  2017 [employer].

## 2020-01-26 ENCOUNTER — Telehealth: Payer: Self-pay

## 2020-01-26 DIAGNOSIS — E1169 Type 2 diabetes mellitus with other specified complication: Secondary | ICD-10-CM

## 2020-01-26 DIAGNOSIS — I10 Essential (primary) hypertension: Secondary | ICD-10-CM

## 2020-01-26 NOTE — Progress Notes (Signed)
I have collaborated with the care management provider regarding care management and care coordination activities outlined in this encounter and have reviewed this encounter including documentation in the note and care plan. I am certifying that I agree with the content of this note and encounter as supervising physician.  

## 2020-01-26 NOTE — Patient Instructions (Addendum)
Dear Colton Porter,  Below is a summary of the goals we discussed during our follow up appointment on January 08, 2020. Please contact me anytime with questions or concerns.   Visit Information  Goals Addressed            This Visit's Progress   . Pharmacy Care Plan       CARE PLAN ENTRY (see longitudinal plan of care for additional care plan information)  Current Barriers:  . Chronic Disease Management support, education, and care coordination needs related to Hypertension and Diabetes   Hypertension BP Readings from Last 3 Encounters:  01/01/20 136/80  12/06/19 (!) 142/86  10/23/19 140/78 .  Pharmacist Clinical Goal(s): o Over the next 6 weeks, patient will work with PharmD and providers to maintain BP goal <140/90 mmHg . Current regimen:  o Losartan 100 mg - 1 tablet daily  . Interventions: o Comprehensive medication review . Patient self care activities - Over the next 6 weeks, patient will: o Check blood pressure 7 days prior to appointments o Ensure daily salt intake < 2300 mg/day  Diabetes Lab Results  Component Value Date/Time   HGBA1C 10.6 (H) 01/22/2020 07:28 AM   HGBA1C 9.8 (A) 10/23/2019 09:21 AM   HGBA1C 10.1 (H) 07/18/2019 11:33 AM .  Pharmacist Clinical Goal(s): o Over the next 6 weeks, patient will work with PharmD and providers to achieve A1c goal <7% . Current regimen:   Glimepiride 2 mg - 1 tablet daily with breakfast  Lantus - Inject 40 units daily   Metformin 500 mg - 1 tablet twice daily  . Interventions: o Recommend switching metformin to ER formulation and take with food to decrease upset stomach o Discussed retrial of Ozempic  . Patient self care activities - Over the next 6 weeks, patient will: o Check blood sugar in the morning before eating or drinking, document, and provide at future appointments o Contact provider with any episodes of hypoglycemia o Review carbohydrate counting handout  Vaccinations: Remain up to date on  vaccinations. Recommend 2 doses of the Shingrix vaccine.   Please see past updates related to this goal by clicking on the "Past Updates" button in the selected goal       The patient verbalized understanding of instructions provided today and agreed to receive a mailed copy of patient instruction and/or educational materials.  Telephone follow up appointment with pharmacy team member scheduled for: February 28, 2020 at 2:00 PM (telephone visit)  Debbora Dus, PharmD Clinical Pharmacist [facility] [place] Carbohydrate Counting for Diabetes Mellitus, Adult  Carbohydrate counting is a method of keeping track of how many carbohydrates you eat. Eating carbohydrates naturally increases the amount of sugar (glucose) in the blood. Counting how many carbohydrates you eat helps keep your blood glucose within normal limits, which helps you manage your diabetes (diabetes mellitus). It is important to know how many carbohydrates you can safely have in each meal. This is different for every person. A diet and nutrition specialist (registered dietitian) can help you make a meal plan and calculate how many carbohydrates you should have at each meal and snack. Carbohydrates are found in the following foods:  Grains, such as breads and cereals.  Dried beans and soy products.  Starchy vegetables, such as potatoes, peas, and corn.  Fruit and fruit juices.  Milk and yogurt.  Sweets and snack foods, such as cake, cookies, candy, chips, and soft drinks. How do I count carbohydrates? There are two  ways to count carbohydrates in food. You can use either of the methods or a combination of both. Reading "Nutrition Facts" on packaged food The "Nutrition Facts" list is included on the labels of almost all packaged foods and beverages in the [place] It includes:  The serving size.  Information about nutrients in each serving, including the grams (g) of carbohydrate per  serving. To use the "Nutrition Facts":  Decide how many servings you will have.  Multiply the number of servings by the number of carbohydrates per serving.  The resulting number is the total amount of carbohydrates that you will be having. Learning standard serving sizes of other foods When you eat carbohydrate foods that are not packaged or do not include "Nutrition Facts" on the label, you need to measure the servings in order to count the amount of carbohydrates:  Measure the foods that you will eat with a food scale or measuring cup, if needed.  Decide how many standard-size servings you will eat.  Multiply the number of servings by 15. Most carbohydrate-rich foods have about 15 g of carbohydrates per serving. ? For example, if you eat 8 oz (170 g) of strawberries, you will have eaten 2 servings and 30 g of carbohydrates (2 servings x 15 g = 30 g).  For foods that have more than one food mixed, such as soups and casseroles, you must count the carbohydrates in each food that is included. The following list contains standard serving sizes of common carbohydrate-rich foods. Each of these servings has about 15 g of carbohydrates:   hamburger bun or  English muffin.   oz (15 mL) syrup.   oz (14 g) jelly.  1 slice of bread.  1 six-inch tortilla.  3 oz (85 g) cooked rice or pasta.  4 oz (113 g) cooked dried beans.  4 oz (113 g) starchy vegetable, such as peas, corn, or potatoes.  4 oz (113 g) hot cereal.  4 oz (113 g) mashed potatoes or  of a large baked potato.  4 oz (113 g) canned or frozen fruit.  4 oz (120 mL) fruit juice.  4-6 crackers.  6 chicken nuggets.  6 oz (170 g) unsweetened dry cereal.  6 oz (170 g) plain fat-free yogurt or yogurt sweetened with artificial sweeteners.  8 oz (240 mL) milk.  8 oz (170 g) fresh fruit or one small piece of fruit.  24 oz (680 g) popped popcorn. Example of carbohydrate counting Sample meal  3 oz (85 g) chicken  breast.  6 oz (170 g) brown rice.  4 oz (113 g) corn.  8 oz (240 mL) milk.  8 oz (170 g) strawberries with sugar-free whipped topping. Carbohydrate calculation 1. Identify the foods that contain carbohydrates: ? Rice. ? Corn. ? Milk. ? Strawberries. 2. Calculate how many servings you have of each food: ? 2 servings rice. ? 1 serving corn. ? 1 serving milk. ? 1 serving strawberries. 3. Multiply each number of servings by 15 g: ? 2 servings rice x 15 g = 30 g. ? 1 serving corn x 15 g = 15 g. ? 1 serving milk x 15 g = 15 g. ? 1 serving strawberries x 15 g = 15 g. 4. Add together all of the amounts to find the total grams of carbohydrates eaten: ? 30 g + 15 g + 15 g + 15 g = 75 g of carbohydrates total. Summary  Carbohydrate counting is a method of keeping track of how many  carbohydrates you eat.  Eating carbohydrates naturally increases the amount of sugar (glucose) in the blood.  Counting how many carbohydrates you eat helps keep your blood glucose within normal limits, which helps you manage your diabetes.  A diet and nutrition specialist (registered dietitian) can help you make a meal plan and calculate how many carbohydrates you should have at each meal and snack. This information is not intended to replace advice given to you by your health care provider. Make sure you discuss any questions you have with your health care provider. Document Revised: 01/05/2017 Document Reviewed: 11/25/2015 Elsevier Patient Education  [place].

## 2020-01-27 DIAGNOSIS — D225 Melanocytic nevi of trunk: Secondary | ICD-10-CM | POA: Diagnosis not present

## 2020-01-27 DIAGNOSIS — D1801 Hemangioma of skin and subcutaneous tissue: Secondary | ICD-10-CM | POA: Diagnosis not present

## 2020-01-27 DIAGNOSIS — L821 Other seborrheic keratosis: Secondary | ICD-10-CM | POA: Diagnosis not present

## 2020-01-27 DIAGNOSIS — L308 Other specified dermatitis: Secondary | ICD-10-CM | POA: Diagnosis not present

## 2020-01-27 DIAGNOSIS — L814 Other melanin hyperpigmentation: Secondary | ICD-10-CM | POA: Diagnosis not present

## 2020-01-27 DIAGNOSIS — L738 Other specified follicular disorders: Secondary | ICD-10-CM | POA: Diagnosis not present

## 2020-01-27 DIAGNOSIS — D485 Neoplasm of uncertain behavior of skin: Secondary | ICD-10-CM | POA: Diagnosis not present

## 2020-01-30 NOTE — Chronic Care Management (AMB) (Signed)
Contacted patient by telephone to see if upset stomach with metformin had improved. Reports still having significant diarrhea with metformin IR 500 mg twice daily. His eating schedule is sporadic, so not always taking with meals. Could we change his prescription to metformin ER 500 mg?  Today, patient reports some lightheadedness upon standing. He thinks it started after his losartan was increased to 100 mg (12/06/19). Asked him to check home BP readings for the next few days and call me back.  Debbora Dus, PharmD Clinical Pharmacist [facility] at [facility] [phone]

## 2020-02-03 ENCOUNTER — Telehealth: Payer: Self-pay

## 2020-02-03 NOTE — Progress Notes (Signed)
Chronic Care Management Pharmacy Assistant   Name: Colton Porter  MRN: [id number] DOB: [birth year]  Reason for Encounter: Medication Review  Patient Questions:  1.  Have you seen any other providers since your last visit? No  2.  Any changes in your medicines or health? No    PCP : Ria Bush, MD  Allergies:   Allergies  Allergen Reactions   Tolmetin Rash    cirrhosis   Ambien [Zolpidem Tartrate] Other (See Comments)    Over-toxicity from liver failure   Morphine And Related     Hallucinations.   Hydrochlorothiazide W-Triamterene Other (See Comments)    REACTION: dizzy, nausea   Lisinopril Other (See Comments)    REACTION: cough, decreased libido    Medications: Outpatient Encounter Medications as of 02/03/2020  Medication Sig   Accu-Chek FastClix Lancets MISC Use three times daily to check sugars E65.11, insulin use   acetaminophen (TYLENOL) 500 MG tablet Take 500 mg by mouth every 6 (six) hours as needed for mild pain.   allopurinol (ZYLOPRIM) 300 MG tablet TAKE 1 TABLET BY MOUTH DAILY.   ASPIR-LOW 81 MG EC tablet Take 1 tablet (81 mg total) by mouth daily.   Blood Glucose Monitoring Suppl (ACCU-CHEK GUIDE) w/Device KIT 1 Units by Does not apply route as directed.   Cholecalciferol (VITAMIN D3) 2000 units TABS Take 1 tablet by mouth at bedtime.   diclofenac Sodium (VOLTAREN) 1 % GEL Apply 2 g topically in the morning and at bedtime.   docusate sodium (COLACE) 100 MG capsule Take 100 mg by mouth daily as needed for mild constipation.    escitalopram (LEXAPRO) 10 MG tablet TAKE 1 TABLET BY MOUTH DAILY.   fluticasone (FLONASE) 50 MCG/ACT nasal spray SPRAY 2 SPRAYS INTO EACH NOSTRIL EVERY DAY   furosemide (LASIX) 40 MG tablet Take 1 tablet (40 mg total) by mouth daily.   glimepiride (AMARYL) 2 MG tablet TAKE 1 TABLET BY MOUTH DAILY WITH BREAKFAST   glucose blood (ACCU-CHEK GUIDE) test strip Check sugars three times daily and as needed when  feeling ill E11.65, insulin use   glucose blood test strip Use as instructed to check sugars three times daily E11.65, insulin use   insulin glargine (LANTUS SOLOSTAR) 100 UNIT/ML Solostar Pen Inject 40 Units into the skin daily.   Insulin Pen Needle (B-D UF III MINI PEN NEEDLES) 31G X 5 MM MISC USE TO INJECT INSULIN DAILY   loratadine (CLARITIN) 10 MG tablet Take 10 mg by mouth daily.   losartan (COZAAR) 100 MG tablet Take 1 tablet (100 mg total) by mouth daily.   losartan (COZAAR) 50 MG tablet TAKE 1 TABLET EVERY DAY   lovastatin (MEVACOR) 20 MG tablet Take 1 tablet (20 mg total) by mouth once a week. (Patient taking differently: Take 20 mg by mouth at bedtime. )   metFORMIN (GLUCOPHAGE) 500 MG tablet Take 1 tablet (500 mg total) by mouth daily with breakfast for 7 days, THEN 1 tablet (500 mg total) 2 (two) times daily with a meal.   Multiple Vitamins-Minerals ([employer] SPECTRAVITE PO) Take 1 tablet by mouth at bedtime.   mycophenolate (MYFORTIC) 360 MG TBEC EC tablet    Omega-3 Fatty Acids (FISH OIL PO) Take 1 tablet by mouth at bedtime.   omeprazole (PRILOSEC) 40 MG capsule TAKE 1 CAPSULE BY MOUTH DAILY.   predniSONE (DELTASONE) 20 MG tablet Take two tablets daily for 3 days followed by one tablet daily for 4 days   tacrolimus (PROGRAF) 1  MG capsule Take 1-2 mg by mouth See admin instructions. Take 2 mg by mouth in the morning, then take 1 mg  by mouth in the evening   No facility-administered encounter medications on file as of 02/03/2020.    Current Diagnosis: Patient Active Problem List   Diagnosis Date Noted   Acute pain of left foot 01/01/2020   Pain, dental 08/21/2019   Orthostatic hypotension 07/31/2019   Syncope 07/30/2019   Protein-calorie malnutrition ([place]) 07/27/2019   Left rib fracture 07/27/2019   Acute respiratory failure with hypoxia ([place]) 07/18/2019   Pneumonia due to COVID-19 virus 07/18/2019   [place] lesion, left 02/05/2018   Chronic heel pain,  left 10/14/2017   Anejaculation 08/09/2017   Chronic gout 05/22/2017   Dyspnea 11/18/2016   BPH (benign prostatic hyperplasia) 10/05/2016   Status post liver transplant ([place]) 03/15/2016   Portal hypertensive gastropathy ([place]) 12/04/2015   Type 2 diabetes mellitus with other specified complication ([place]) 95/02/3266   Gynecomastia    CKD (chronic kidney disease) stage 3, GFR 30-59 ml/min ([place]) 11/04/2015   Depression, major, single episode, moderate ([place]) 07/09/2015   Headache 02/19/2015   Insomnia 12/06/2014   Hypomagnesemia    Other fatigue 11/18/2014   Internal hemorrhoid 10/01/2014   Erectile dysfunction 12/45/8099   Alcoholic cirrhosis of liver without ascites ([place]) 07/28/2014   (HFpEF) heart failure with preserved ejection fraction ([place]) 11/01/2013   Essential hypertension 06/04/2013   OSA on CPAP 07/11/2012   History of alcohol dependence ([place]) 07/11/2012   Thrombocytopenia ([place]) 12/15/2011   Morbid obesity with BMI of 40.0-44.9, adult ([place]) 11/16/2011   Routine general medical examination at a health care facility 09/22/2010   NEUROPATHY 05/27/2009   Pedal edema 09/22/2008   Dyslipidemia associated with type 2 diabetes mellitus ([place]) 02/15/2007   Allergic rhinitis 02/15/2007   GERD 02/15/2007    Goals Addressed   None     Follow-Up:  Coordination of [facility] Pharmacy Services   Reached out to patient for medication adherence call. Rescheduled pharmacy visit to 05/29/20 at [facility].   Patient had some concerns with his blood pressures. The last one checked was 146/78. Requested per Sharyn Lull for him to keep a log over the next few days and will call him back on Friday to discuss with him at that time.   Martinique Uselman, [place] Pharmacist Assistant  [phone]

## 2020-02-06 ENCOUNTER — Encounter: Payer: Self-pay | Admitting: Family Medicine

## 2020-02-06 ENCOUNTER — Other Ambulatory Visit: Payer: Self-pay

## 2020-02-06 ENCOUNTER — Ambulatory Visit (INDEPENDENT_AMBULATORY_CARE_PROVIDER_SITE_OTHER): Payer: Medicare HMO | Admitting: Family Medicine

## 2020-02-06 VITALS — BP 126/84 | HR 92 | Temp 97.6°F | Ht 66.5 in | Wt 270.0 lb

## 2020-02-06 DIAGNOSIS — R972 Elevated prostate specific antigen [PSA]: Secondary | ICD-10-CM

## 2020-02-06 DIAGNOSIS — Z794 Long term (current) use of insulin: Secondary | ICD-10-CM | POA: Diagnosis not present

## 2020-02-06 DIAGNOSIS — L290 Pruritus ani: Secondary | ICD-10-CM | POA: Diagnosis not present

## 2020-02-06 DIAGNOSIS — E785 Hyperlipidemia, unspecified: Secondary | ICD-10-CM

## 2020-02-06 DIAGNOSIS — Z944 Liver transplant status: Secondary | ICD-10-CM | POA: Diagnosis not present

## 2020-02-06 DIAGNOSIS — Z0000 Encounter for general adult medical examination without abnormal findings: Secondary | ICD-10-CM | POA: Diagnosis not present

## 2020-02-06 DIAGNOSIS — Z7189 Other specified counseling: Secondary | ICD-10-CM | POA: Insufficient documentation

## 2020-02-06 DIAGNOSIS — I10 Essential (primary) hypertension: Secondary | ICD-10-CM

## 2020-02-06 DIAGNOSIS — C61 Malignant neoplasm of prostate: Secondary | ICD-10-CM | POA: Insufficient documentation

## 2020-02-06 DIAGNOSIS — E1169 Type 2 diabetes mellitus with other specified complication: Secondary | ICD-10-CM

## 2020-02-06 DIAGNOSIS — N1831 Chronic kidney disease, stage 3a: Secondary | ICD-10-CM | POA: Diagnosis not present

## 2020-02-06 LAB — POCT URINALYSIS DIPSTICK
Appearance: NEGATIVE
Bilirubin, UA: NEGATIVE
Blood, UA: NEGATIVE
Glucose, UA: POSITIVE — AB
Ketones, UA: NEGATIVE
Leukocytes, UA: NEGATIVE
Nitrite, UA: NEGATIVE
Protein, UA: NEGATIVE
Spec Grav, UA: 1.02 (ref 1.010–1.025)
Urobilinogen, UA: 2 E.U./dL — AB
pH, UA: 6 (ref 5.0–8.0)

## 2020-02-06 MED ORDER — LOSARTAN POTASSIUM 50 MG PO TABS: 50.0000 mg | ORAL_TABLET | Freq: Every day | ORAL | 1 refills | Status: DC

## 2020-02-06 MED ORDER — LANTUS SOLOSTAR 100 UNIT/ML ~~LOC~~ SOPN: 40.0000 [IU] | PEN_INJECTOR | Freq: Every day | SUBCUTANEOUS | 3 refills | Status: DC

## 2020-02-06 MED ORDER — FUROSEMIDE 40 MG PO TABS: 40.0000 mg | ORAL_TABLET | Freq: Every day | ORAL | 3 refills | Status: DC

## 2020-02-06 MED ORDER — LOVASTATIN 20 MG PO TABS
20.0000 mg | ORAL_TABLET | Freq: Every day | ORAL | 3 refills | Status: DC
Start: 2020-02-06 — End: 2021-03-15

## 2020-02-06 MED ORDER — METFORMIN HCL ER 500 MG PO TB24: 500.0000 mg | ORAL_TABLET | Freq: Every day | ORAL | 6 refills | Status: DC

## 2020-02-06 NOTE — Assessment & Plan Note (Signed)
Dizziness on 100mg  losartan - will drop back to 50mg  dose. Continues lasix 40mg  daily.

## 2020-02-06 NOTE — Progress Notes (Signed)
This visit was conducted in person.  BP 126/84   Pulse 92   Temp 97.6 F (36.4 C)   Ht 5' 6.5" (1.689 m)   Wt 270 lb (122.5 kg)   SpO2 95%   BMI 42.93 kg/m   BP Readings from Last 3 Encounters:  02/06/20 126/84  01/01/20 136/80  12/06/19 (!) 142/86    CC: CPE Subjective:    Patient ID: Colton Porter, male    DOB: 16-Apr-1966, 54 y.o.   MRN: [id number]  HPI: Colton Porter is a 54 y.o. male presenting on 02/06/2020 for Annual Exam (Pt stated taking Rx losartan '100mg'$ --causing diziness)   Saw health advisor last week for medicare wellness visit. Note reviewed.    No exam data present    Clinical Support from 01/22/2020 in [place] [facility] Total Score 0      Fall Risk  01/22/2020 10/15/2018 10/06/2017 09/11/2017  Falls in the past year? 1 0 No No  Comment passed out - - -  Number falls in past yr: 0 - - -  Injury with Fall? 1 - - -  Comment broke rib - - -  Risk for fall due to : Medication side effect;Impaired balance/gait - - -  Follow up Falls evaluation completed;Falls prevention discussed - - -  06/2019 during COVID had syncopal episode with resultant rib fracture.  DM - uncontrolled with A1c 10.6%. Notes ongoing diarrhea since starting metformin '500mg'$  bid - will change to XR '500mg'$  once daily. Last sugar check was last week - lunch postprandial 230, dinner postprandial 300 - continues lantus 40u daily. He has started glucerna shakes for lunch.  HLD - lovastatin recently increased to '20mg'$  daily by liver clinic.  HTN - losartan '100mg'$  has led to dizziness - started by cardiology.   Perianal itching for months. Can see blood from scratching at times. No other skin rash.   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 Symptoms of nocturia x1-2 present. No stream weaknening noted. Check DRE today  Lung  cancer screening - not eligible  Flu shotyearly Pneumovax 2016 [place] 10/2019, 11/2019 Td 2008, Tdap 2017  shingrix - discussed  Hep A/B series completed  Advanced directives: does not have at home. Wife Faythe Dingwall then daughter Campbell Stall would be HCPOA. Advanced directive packet provided today.  Seat belt use  Sunscreen use. No changing moles on skin.sees derm.  Non smoker. Quit chewing tobacco 10/10/2015.  Alcohol -abstinentsince 10/10/2014.  Dentist - yearly  Eye exam - yearly   Lives with wife Occupation: full disability after cirrhosis dx, some working  Activity: some yardwork Diet: good water, good fruit intake     Relevant past medical, surgical, family and social history reviewed and updated as indicated. Interim medical history since our last visit reviewed. Allergies and medications reviewed and updated. Outpatient Medications Prior to Visit  Medication Sig Dispense Refill  . Accu-Chek FastClix Lancets MISC Use three times daily to check sugars E65.11, insulin use 100 each 12  . acetaminophen (TYLENOL) 500 MG tablet Take 500 mg by mouth every 6 (six) hours as needed for mild pain.    Marland Kitchen allopurinol (ZYLOPRIM) 300 MG tablet TAKE 1 TABLET BY MOUTH DAILY. 90 tablet 0  . ASPIR-LOW 81 MG EC tablet Take 1 tablet (81 mg total) by mouth daily. 90 tablet 3  . Blood Glucose Monitoring Suppl (ACCU-CHEK GUIDE) w/Device  KIT 1 Units by Does not apply route as directed. 1 kit 0  . Cholecalciferol (VITAMIN D3) 2000 units TABS Take 1 tablet by mouth at bedtime.    . docusate sodium (COLACE) 100 MG capsule Take 100 mg by mouth daily as needed for mild constipation.     Marland Kitchen escitalopram (LEXAPRO) 10 MG tablet TAKE 1 TABLET BY MOUTH DAILY. 90 tablet 0  . fluticasone (FLONASE) 50 MCG/ACT nasal spray SPRAY 2 SPRAYS INTO EACH NOSTRIL EVERY DAY 48 mL 0  . glimepiride (AMARYL) 2 MG tablet TAKE 1 TABLET BY MOUTH DAILY WITH BREAKFAST 90 tablet 0  . glucose blood (ACCU-CHEK GUIDE)  test strip Check sugars three times daily and as needed when feeling ill E11.65, insulin use 100 each 11  . glucose blood test strip Use as instructed to check sugars three times daily E11.65, insulin use 100 each 12  . Insulin Pen Needle (B-D UF III MINI PEN NEEDLES) 31G X 5 MM MISC USE TO INJECT INSULIN DAILY 100 each 3  . loratadine (CLARITIN) 10 MG tablet Take 10 mg by mouth daily.    . Multiple Vitamins-Minerals ([employer] SPECTRAVITE PO) Take 1 tablet by mouth at bedtime.    . mycophenolate (MYFORTIC) 360 MG TBEC EC tablet     . Omega-3 Fatty Acids (FISH OIL PO) Take 1 tablet by mouth at bedtime.    Marland Kitchen omeprazole (PRILOSEC) 40 MG capsule TAKE 1 CAPSULE BY MOUTH DAILY. 90 capsule 0  . tacrolimus (PROGRAF) 1 MG capsule Take 1-2 mg by mouth See admin instructions. Take 2 mg by mouth in the morning, then take 1 mg  by mouth in the evening    . furosemide (LASIX) 40 MG tablet Take 1 tablet (40 mg total) by mouth daily. 90 tablet 3  . insulin glargine (LANTUS SOLOSTAR) 100 UNIT/ML Solostar Pen Inject 40 Units into the skin daily. 45 mL 3  . losartan (COZAAR) 100 MG tablet Take 1 tablet (100 mg total) by mouth daily. 90 tablet 3  . losartan (COZAAR) 50 MG tablet TAKE 1 TABLET EVERY DAY 90 tablet 0  . lovastatin (MEVACOR) 20 MG tablet Take 1 tablet (20 mg total) by mouth once a week. (Patient taking differently: Take 20 mg by mouth at bedtime. ) 13 tablet 3  . metFORMIN (GLUCOPHAGE) 500 MG tablet Take 1 tablet (500 mg total) by mouth daily with breakfast for 7 days, THEN 1 tablet (500 mg total) 2 (two) times daily with a meal. 180 tablet 1  . diclofenac Sodium (VOLTAREN) 1 % GEL Apply 2 g topically in the morning and at bedtime. (Patient not taking: Reported on 02/06/2020) 100 g   . predniSONE (DELTASONE) 20 MG tablet Take two tablets daily for 3 days followed by one tablet daily for 4 days 10 tablet 0   No facility-administered medications prior to visit.     Per HPI unless specifically indicated in ROS  section below Review of Systems  Constitutional: Negative for activity change, appetite change, chills, fatigue, fever and unexpected weight change.  HENT: Negative for hearing loss.   Eyes: Negative for visual disturbance.  Respiratory: Positive for cough (am), chest tightness and shortness of breath. Negative for wheezing.   Cardiovascular: Positive for leg swelling (uses compression stockings regularly). Negative for chest pain and palpitations.  Gastrointestinal: Positive for diarrhea. Negative for abdominal distention, abdominal pain, blood in stool, constipation, nausea and vomiting.  Genitourinary: Negative for difficulty urinating and hematuria.  Musculoskeletal: Negative for arthralgias, myalgias and neck  pain.  Skin: Negative for rash.  Neurological: Positive for dizziness (losartan related). Negative for seizures, syncope and headaches.  Hematological: Negative for adenopathy. Does not bruise/bleed easily.  Psychiatric/Behavioral: Negative for dysphoric mood. The patient is not nervous/anxious.    Objective:  BP 126/84   Pulse 92   Temp 97.6 F (36.4 C)   Ht 5' 6.5" (1.689 m)   Wt 270 lb (122.5 kg)   SpO2 95%   BMI 42.93 kg/m   Wt Readings from Last 3 Encounters:  02/06/20 270 lb (122.5 kg)  01/01/20 272 lb (123.4 kg)  12/06/19 271 lb (122.9 kg)      Physical Exam Vitals and nursing note reviewed.  Constitutional:      General: He is not in acute distress.    Appearance: Normal appearance. He is well-developed. He is obese. He is not ill-appearing.  HENT:     Head: Normocephalic and atraumatic.     Right Ear: Hearing, tympanic membrane, ear canal and external ear normal.     Left Ear: Hearing, tympanic membrane, ear canal and external ear normal.     Mouth/Throat:     Pharynx: Uvula midline.  Eyes:     General: No scleral icterus.    Extraocular Movements: Extraocular movements intact.     Conjunctiva/sclera: Conjunctivae normal.     Pupils: Pupils are equal,  round, and reactive to light.  Cardiovascular:     Rate and Rhythm: Normal rate and regular rhythm.     Pulses: Normal pulses.          Radial pulses are 2+ on the right side and 2+ on the left side.     Heart sounds: Normal heart sounds. No murmur heard.   Pulmonary:     Effort: Pulmonary effort is normal. No respiratory distress.     Breath sounds: Normal breath sounds. No wheezing, rhonchi or rales.  Abdominal:     General: Bowel sounds are normal. There is distension.     Palpations: Abdomen is soft. There is no mass.     Tenderness: There is no abdominal tenderness. There is no guarding or rebound.     Hernia: No hernia is present.  Genitourinary:    Prostate: Normal. Not enlarged (25gm), not tender and no nodules present.     Rectum: No mass, tenderness, anal fissure, external hemorrhoid or internal hemorrhoid. Normal anal tone.     Comments: Perianal rawness  Musculoskeletal:        General: Normal range of motion.     Cervical back: Normal range of motion and neck supple.     Right lower leg: Edema present.     Left lower leg: Edema present.  Lymphadenopathy:     Cervical: No cervical adenopathy.  Skin:    General: Skin is warm and dry.     Findings: No rash.  Neurological:     General: No focal deficit present.     Mental Status: He is alert and oriented to person, place, and time.     Comments: CN grossly intact, station and gait intact  Psychiatric:        Mood and Affect: Mood normal.        Behavior: Behavior normal.        Thought Content: Thought content normal.        Judgment: Judgment normal.       Results for orders placed or performed in visit on 02/06/20  POCT Urinalysis Dipstick  Result Value Ref Range  Color, UA yellow    Clarity, UA clear    Glucose, UA Positive (A) Negative   Bilirubin, UA neg    Ketones, UA neg    Spec Grav, UA 1.020 1.010 - 1.025   Blood, UA neg    pH, UA 6.0 5.0 - 8.0   Protein, UA Negative Negative   Urobilinogen, UA  2.0 (A) 0.2 or 1.0 E.U./dL   Nitrite, UA neg    Leukocytes, UA Negative Negative   Appearance neg    Odor     Lab Results  Component Value Date   PSA 8.22 (H) 01/22/2020   PSA 3.99 10/15/2018   PSA 2.66 10/06/2017   Assessment & Plan:  This visit occurred during the SARS-CoV-2 public health emergency.  Safety protocols were in place, including screening questions prior to the visit, additional usage of staff PPE, and extensive cleaning of exam room while observing appropriate contact time as indicated for disinfecting solutions.   Problem List Items Addressed This Visit    Type 2 diabetes mellitus with other specified complication ([place]) (Chronic)    Chronically uncontrolled despite increasing lantus to 40u daily. Continues amaryl '2mg'$  daily. No low sugars. Diarrhea to metformin - will change to metformin XR form. RTC 10 days labs only recheck Mg.  Refer to endocrinology for further assistance in management.      Relevant Medications   metFORMIN (GLUCOPHAGE XR) 500 MG 24 hr tablet   losartan (COZAAR) 50 MG tablet   lovastatin (MEVACOR) 20 MG tablet   insulin glargine (LANTUS SOLOSTAR) 100 UNIT/ML Solostar Pen   Other Relevant Orders   Ambulatory referral to Endocrinology   POCT Urinalysis Dipstick (Completed)   Status post liver transplant ([place]) (Chronic)    Continues on regular mycophenolate and prograf.       Routine general medical examination at a health care facility - Primary    Preventative protocols reviewed and updated unless pt declined. Discussed healthy diet and lifestyle.       Perianal pruritus    With raw skin from scratching Check pinworm prep.  If normal, consider treatment of perianal irritation with low potency steroid cream.       Relevant Orders   Pinworm prep   Essential hypertension (Chronic)    Dizziness on '100mg'$  losartan - will drop back to '50mg'$  dose. Continues lasix '40mg'$  daily.       Relevant Medications   losartan (COZAAR) 50 MG tablet    lovastatin (MEVACOR) 20 MG tablet   furosemide (LASIX) 40 MG tablet   Elevated PSA    Elevated PSA noted today, no significant abnormality on DRE. Will rpt PSA free/total in 2 wks and if persistently elevated low threshold to refer to urology. Pt agrees .      Relevant Orders   PSA, total and free   POCT Urinalysis Dipstick (Completed)   Dyslipidemia associated with type 2 diabetes mellitus ([place])    LDL at goal however trig markedly elevated - anticipate driven by hyperglycemia. Work towards better glycemic control. Consider colestipol if ongoing diarrhea (s/p cholecystectomy during liver transplant). Lovastatin was increased to daily dosing last month by liver clinic. The ASCVD Risk score Mikey Bussing DC Jr., et al., 2013) failed to calculate for the following reasons:   The valid total cholesterol range is 130 to 320 mg/dL       Relevant Medications   metFORMIN (GLUCOPHAGE XR) 500 MG 24 hr tablet   losartan (COZAAR) 50 MG tablet   lovastatin (MEVACOR) 20 MG  tablet   insulin glargine (LANTUS SOLOSTAR) 100 UNIT/ML Solostar Pen   CKD (chronic kidney disease) stage 3, GFR 30-59 ml/min (HCC)   Relevant Orders   Magnesium   Phosphorus   Parathyroid hormone, intact (no Ca)   Advanced directives, counseling/discussion    Advanced directives: does not have at home. Wife Faythe Dingwall then daughter Campbell Stall would be HCPOA. Advanced directive packet provided today.           Meds ordered this encounter  Medications  . metFORMIN (GLUCOPHAGE XR) 500 MG 24 hr tablet    Sig: Take 1 tablet (500 mg total) by mouth daily with breakfast.    Dispense:  30 tablet    Refill:  6  . losartan (COZAAR) 50 MG tablet    Sig: Take 1 tablet (50 mg total) by mouth daily.    Dispense:  30 tablet    Refill:  1  . lovastatin (MEVACOR) 20 MG tablet    Sig: Take 1 tablet (20 mg total) by mouth at bedtime.    Dispense:  90 tablet    Refill:  3  . DISCONTD: insulin glargine (LANTUS SOLOSTAR) 100 UNIT/ML Solostar  Pen    Sig: Inject 40 Units into the skin daily.    Dispense:  45 mL    Refill:  3  . furosemide (LASIX) 40 MG tablet    Sig: Take 1 tablet (40 mg total) by mouth daily.    Dispense:  30 tablet    Refill:  3  . insulin glargine (LANTUS SOLOSTAR) 100 UNIT/ML Solostar Pen    Sig: Inject 40 Units into the skin daily.    Dispense:  45 mL    Refill:  3   Orders Placed This Encounter  Procedures  . Pinworm prep    Standing Status:   Future    Standing Expiration Date:   01/26/2021  . Magnesium    Standing Status:   Future    Standing Expiration Date:   02/05/2021  . Phosphorus    Standing Status:   Future    Standing Expiration Date:   02/05/2021  . Parathyroid hormone, intact (no Ca)    Standing Status:   Future    Standing Expiration Date:   02/05/2021  . PSA, total and free    Standing Status:   Future    Standing Expiration Date:   02/05/2021  . Ambulatory referral to Endocrinology    Referral Priority:   Routine    Referral Type:   Consultation    Referral Reason:   Specialty Services Required    Number of Visits Requested:   1  . POCT Urinalysis Dipstick    Patient instructions: If interested, check with pharmacy about new 2 shot shingles series (shingrix).  Sugar staying too high - we will refer you to diabetes doctor.  Change metformin to '500mg'$  XR once daily - sent to local pharmacy.  Decrease losartan to '50mg'$  daily - sent to local pharmacy  PSA was elevated - urine test today.  Advanced directive packet provided today.  Call to schedule eye exam.  Return in 10 days for repeat lab visit to recheck prostate and one more kidney test.  Return in 2-3 months for follow up visit.   Follow up plan: Return in about 3 months (around 05/08/2020) for follow up visit.  Ria Bush, MD

## 2020-02-06 NOTE — Assessment & Plan Note (Signed)
Elevated PSA noted today, no significant abnormality on DRE. Will rpt PSA free/total in 2 wks and if persistently elevated low threshold to refer to urology. Pt agrees .

## 2020-02-06 NOTE — Assessment & Plan Note (Signed)
Preventative protocols reviewed and updated unless pt declined. Discussed healthy diet and lifestyle.  

## 2020-02-06 NOTE — Assessment & Plan Note (Signed)
Continues on regular mycophenolate and prograf.

## 2020-02-06 NOTE — Assessment & Plan Note (Signed)
Advanced directives: does not have at home. Wife Colton Porter then daughter Colton Porter would be HCPOA. Advanced directive packet provided today.

## 2020-02-06 NOTE — Assessment & Plan Note (Addendum)
LDL at goal however trig markedly elevated - anticipate driven by hyperglycemia. Work towards better glycemic control. Consider colestipol if ongoing diarrhea (s/p cholecystectomy during liver transplant). Lovastatin was increased to daily dosing last month by liver clinic. The ASCVD Risk score Colton Bussing DC Jr., et al., 2013) failed to calculate for the following reasons:   The valid total cholesterol range is 130 to 320 mg/dL

## 2020-02-06 NOTE — Patient Instructions (Addendum)
If interested, check with pharmacy about new 2 shot shingles series (shingrix).  Sugar staying too high - we will refer you to diabetes doctor.  Change metformin to 500mg  XR once daily - sent to local pharmacy.  Decrease losartan to 50mg  daily - sent to local pharmacy  PSA was elevated - urine test today.  Advanced directive packet provided today.  Call to schedule eye exam.  Return in 10 days for repeat lab visit to recheck prostate and one more kidney test.  Return in 2-3 months for follow up visit.   Health Maintenance, Male Adopting a healthy lifestyle and getting preventive care are important in promoting health and wellness. Ask your health care provider about:  The right schedule for you to have regular tests and exams.  Things you can do on your own to prevent diseases and keep yourself healthy. What should I know about diet, weight, and exercise? Eat a healthy diet   Eat a diet that includes plenty of vegetables, fruits, low-fat dairy products, and lean protein.  Do not eat a lot of foods that are high in solid fats, added sugars, or sodium. Maintain a healthy weight Body mass index (BMI) is a measurement that can be used to identify possible weight problems. It estimates body fat based on height and weight. Your health care provider can help determine your BMI and help you achieve or maintain a healthy weight. Get regular exercise Get regular exercise. This is one of the most important things you can do for your health. Most adults should:  Exercise for at least 150 minutes each week. The exercise should increase your heart rate and make you sweat (moderate-intensity exercise).  Do strengthening exercises at least twice a week. This is in addition to the moderate-intensity exercise.  Spend less time sitting. Even light physical activity can be beneficial. Watch cholesterol and blood lipids Have your blood tested for lipids and cholesterol at 53 years of age, then have this  test every 5 years. You may need to have your cholesterol levels checked more often if:  Your lipid or cholesterol levels are high.  You are older than 53 years of age.  You are at high risk for heart disease. What should I know about cancer screening? Many types of cancers can be detected early and may often be prevented. Depending on your health history and family history, you may need to have cancer screening at various ages. This may include screening for:  Colorectal cancer.  Prostate cancer.  Skin cancer.  Lung cancer. What should I know about heart disease, diabetes, and high blood pressure? Blood pressure and heart disease  High blood pressure causes heart disease and increases the risk of stroke. This is more likely to develop in people who have high blood pressure readings, are of African descent, or are overweight.  Talk with your health care provider about your target blood pressure readings.  Have your blood pressure checked: ? Every 3-5 years if you are 4-36 years of age. ? Every year if you are 34 years old or older.  If you are between the ages of 44 and 19 and are a current or former smoker, ask your health care provider if you should have a one-time screening for abdominal aortic aneurysm (AAA). Diabetes Have regular diabetes screenings. This checks your fasting blood sugar level. Have the screening done:  Once every three years after age 69 if you are at a normal weight and have a low risk for  diabetes.  More often and at a younger age if you are overweight or have a high risk for diabetes. What should I know about preventing infection? Hepatitis B If you have a higher risk for hepatitis B, you should be screened for this virus. Talk with your health care provider to find out if you are at risk for hepatitis B infection. Hepatitis C Blood testing is recommended for:  Everyone born from [birth year] through 1964.  Anyone with known risk factors for hepatitis  C. Sexually transmitted infections (STIs)  You should be screened each year for STIs, including gonorrhea and chlamydia, if: ? You are sexually active and are younger than 53 years of age. ? You are older than 53 years of age and your health care provider tells you that you are at risk for this type of infection. ? Your sexual activity has changed since you were last screened, and you are at increased risk for chlamydia or gonorrhea. Ask your health care provider if you are at risk.  Ask your health care provider about whether you are at high risk for HIV. Your health care provider may recommend a prescription medicine to help prevent HIV infection. If you choose to take medicine to prevent HIV, you should first get tested for HIV. You should then be tested every 3 months for as long as you are taking the medicine. Follow these instructions at home: Lifestyle  Do not use any products that contain nicotine or tobacco, such as cigarettes, e-cigarettes, and chewing tobacco. If you need help quitting, ask your health care provider.  Do not use street drugs.  Do not share needles.  Ask your health care provider for help if you need support or information about quitting drugs. Alcohol use  Do not drink alcohol if your health care provider tells you not to drink.  If you drink alcohol: ? Limit how much you have to 0-2 drinks a day. ? Be aware of how much alcohol is in your drink. In the [place] one drink equals one 12 oz bottle of beer (355 mL), one 5 oz glass of wine (148 mL), or one 1 oz glass of hard liquor (44 mL). General instructions  Schedule regular health, dental, and eye exams.  Stay current with your vaccines.  Tell your health care provider if: ? You often feel depressed. ? You have ever been abused or do not feel safe at home. Summary  Adopting a healthy lifestyle and getting preventive care are important in promoting health and wellness.  Follow your health care  provider's instructions about healthy diet, exercising, and getting tested or screened for diseases.  Follow your health care provider's instructions on monitoring your cholesterol and blood pressure. This information is not intended to replace advice given to you by your health care provider. Make sure you discuss any questions you have with your health care provider. Document Revised: 06/06/2018 Document Reviewed: 06/06/2018 Elsevier Patient Education  2020 [employer].

## 2020-02-06 NOTE — Assessment & Plan Note (Addendum)
Chronically uncontrolled despite increasing lantus to 40u daily. Continues amaryl 2mg  daily. No low sugars. Diarrhea to metformin - will change to metformin XR form. RTC 10 days labs only recheck Mg.  Refer to endocrinology for further assistance in management.

## 2020-02-06 NOTE — Assessment & Plan Note (Addendum)
With raw skin from scratching Check pinworm prep.  If normal, consider treatment of perianal irritation with low potency steroid cream.

## 2020-02-07 ENCOUNTER — Telehealth: Payer: Self-pay

## 2020-02-07 NOTE — Progress Notes (Signed)
Unsuccessful outreach to patient. Left voicemail for him to return call. 02/07/20 [place] Uselman, [place] Pharmacist Assistant  [phone]

## 2020-02-07 NOTE — Telephone Encounter (Signed)
I have collaborated with the care management provider regarding care management and care coordination activities outlined in this encounter and have reviewed this encounter including documentation in the note and care plan. I am certifying that I agree with the content of this note and encounter as supervising physician.  

## 2020-02-17 ENCOUNTER — Other Ambulatory Visit (INDEPENDENT_AMBULATORY_CARE_PROVIDER_SITE_OTHER): Payer: Medicare HMO

## 2020-02-17 ENCOUNTER — Telehealth: Payer: Self-pay

## 2020-02-17 ENCOUNTER — Other Ambulatory Visit: Payer: Self-pay

## 2020-02-17 DIAGNOSIS — R972 Elevated prostate specific antigen [PSA]: Secondary | ICD-10-CM | POA: Diagnosis not present

## 2020-02-17 DIAGNOSIS — N1831 Chronic kidney disease, stage 3a: Secondary | ICD-10-CM | POA: Diagnosis not present

## 2020-02-17 LAB — MAGNESIUM: Magnesium: 1.3 mg/dL — ABNORMAL LOW (ref 1.5–2.5)

## 2020-02-17 LAB — PHOSPHORUS: Phosphorus: 3.3 mg/dL (ref 2.3–4.6)

## 2020-02-17 NOTE — Progress Notes (Addendum)
Chronic Care Management Pharmacy Assistant   Name: Colton Porter  MRN: [id number] DOB: [birth year]  Reason for Encounter: Medication Review  Patient Questions:  1.  Have you seen any other providers since your last visit? Yes, see notes below  2.  Any changes in your medicines or health? Yes, see notes below   02/06/2020- OV to PCP [name]) - Annual Exam- Decreased Losartan to 50 mg daily due to dizziness. Lovastatin 20 mg daily (increased from 20 mg weekly), Metformin 500 mg daily due to diarrhea (was BID daily)  PCP : Ria Bush, MD  Allergies:   Allergies  Allergen Reactions  . Tolmetin Rash    cirrhosis  . Ambien [Zolpidem Tartrate] Other (See Comments)    Over-toxicity from liver failure  . Morphine And Related     Hallucinations.  . Hydrochlorothiazide W-Triamterene Other (See Comments)    REACTION: dizzy, nausea  . Lisinopril Other (See Comments)    REACTION: cough, decreased libido    Medications: Outpatient Encounter Medications as of 02/17/2020  Medication Sig  . Accu-Chek FastClix Lancets MISC Use three times daily to check sugars E65.11, insulin use  . acetaminophen (TYLENOL) 500 MG tablet Take 500 mg by mouth every 6 (six) hours as needed for mild pain.  Marland Kitchen allopurinol (ZYLOPRIM) 300 MG tablet TAKE 1 TABLET BY MOUTH DAILY.  Marland Kitchen ASPIR-LOW 81 MG EC tablet Take 1 tablet (81 mg total) by mouth daily.  . Blood Glucose Monitoring Suppl (ACCU-CHEK GUIDE) w/Device KIT 1 Units by Does not apply route as directed.  . Cholecalciferol (VITAMIN D3) 2000 units TABS Take 1 tablet by mouth at bedtime.  . diclofenac Sodium (VOLTAREN) 1 % GEL Apply 2 g topically in the morning and at bedtime. (Patient not taking: Reported on 02/06/2020)  . docusate sodium (COLACE) 100 MG capsule Take 100 mg by mouth daily as needed for mild constipation.   Marland Kitchen escitalopram (LEXAPRO) 10 MG tablet TAKE 1 TABLET BY MOUTH DAILY.  . fluticasone (FLONASE) 50 MCG/ACT nasal spray SPRAY 2 SPRAYS  INTO EACH NOSTRIL EVERY DAY  . furosemide (LASIX) 40 MG tablet Take 1 tablet (40 mg total) by mouth daily.  Marland Kitchen glimepiride (AMARYL) 2 MG tablet TAKE 1 TABLET BY MOUTH DAILY WITH BREAKFAST  . glucose blood (ACCU-CHEK GUIDE) test strip Check sugars three times daily and as needed when feeling ill E11.65, insulin use  . glucose blood test strip Use as instructed to check sugars three times daily E11.65, insulin use  . insulin glargine (LANTUS SOLOSTAR) 100 UNIT/ML Solostar Pen Inject 40 Units into the skin daily.  . Insulin Pen Needle (B-D UF III MINI PEN NEEDLES) 31G X 5 MM MISC USE TO INJECT INSULIN DAILY  . loratadine (CLARITIN) 10 MG tablet Take 10 mg by mouth daily.  Marland Kitchen losartan (COZAAR) 50 MG tablet Take 1 tablet (50 mg total) by mouth daily.  Marland Kitchen lovastatin (MEVACOR) 20 MG tablet Take 1 tablet (20 mg total) by mouth at bedtime.  . metFORMIN (GLUCOPHAGE XR) 500 MG 24 hr tablet Take 1 tablet (500 mg total) by mouth daily with breakfast.  . Multiple Vitamins-Minerals (CVS SPECTRAVITE PO) Take 1 tablet by mouth at bedtime.  . mycophenolate (MYFORTIC) 360 MG TBEC EC tablet   . Omega-3 Fatty Acids (FISH OIL PO) Take 1 tablet by mouth at bedtime.  Marland Kitchen omeprazole (PRILOSEC) 40 MG capsule TAKE 1 CAPSULE BY MOUTH DAILY.  Marland Kitchen tacrolimus (PROGRAF) 1 MG capsule Take 1-2 mg by mouth See admin instructions. Take  2 mg by mouth in the morning, then take 1 mg  by mouth in the evening   No facility-administered encounter medications on file as of 02/17/2020.    Current Diagnosis: Patient Active Problem List   Diagnosis Date Noted  . Perianal pruritus 02/06/2020  . Elevated PSA 02/06/2020  . Advanced directives, counseling/discussion 02/06/2020  . Acute pain of left foot 01/01/2020  . Pain, dental 08/21/2019  . Orthostatic hypotension 07/31/2019  . Syncope 07/30/2019  . Protein-calorie malnutrition ([place]) 07/27/2019  . Left rib fracture 07/27/2019  . Acute respiratory failure with hypoxia ([place]) 07/18/2019  .  Pneumonia due to COVID-19 virus 07/18/2019  . [place] lesion, left 02/05/2018  . Chronic heel pain, left 10/14/2017  . Anejaculation 08/09/2017  . Chronic gout 05/22/2017  . Dyspnea 11/18/2016  . BPH (benign prostatic hyperplasia) 10/05/2016  . Status post liver transplant ([place]) 03/15/2016  . Portal hypertensive gastropathy ([place]) 12/04/2015  . Type 2 diabetes mellitus with other specified complication ([place]) 16/60/6301  . Gynecomastia   . CKD (chronic kidney disease) stage 3, GFR 30-59 ml/min (HCC) 11/04/2015  . Depression, major, single episode, moderate ([place]) 07/09/2015  . Headache 02/19/2015  . Insomnia 12/06/2014  . Hypomagnesemia   . Other fatigue 11/18/2014  . Internal hemorrhoid 10/01/2014  . Erectile dysfunction 10/01/2014  . Alcoholic cirrhosis of liver without ascites ([place]) 07/28/2014  . (HFpEF) heart failure with preserved ejection fraction ([place]) 11/01/2013  . Essential hypertension 06/04/2013  . OSA on CPAP 07/11/2012  . History of alcohol dependence ([place]) 07/11/2012  . Thrombocytopenia ([place]) 12/15/2011  . Morbid obesity with BMI of 40.0-44.9, adult ([place]) 11/16/2011  . Routine general medical examination at a health care facility 09/22/2010  . NEUROPATHY 05/27/2009  . Pedal edema 09/22/2008  . Dyslipidemia associated with type 2 diabetes mellitus ([place]) 02/15/2007  . Allergic rhinitis 02/15/2007  . GERD 02/15/2007    Goals Addressed   None     Follow-Up:  Coordination of [facility] Pharmacy Services  Reviewed chart prior to disease state call. Spoke with patient regarding BP  Recent Office Vitals: BP Readings from Last 3 Encounters:  02/06/20 126/84  01/01/20 136/80  12/06/19 (!) 142/86   Pulse Readings from Last 3 Encounters:  02/06/20 92  01/01/20 90  12/06/19 88    Wt Readings from Last 3 Encounters:  02/06/20 270 lb (122.5 kg)  01/01/20 272 lb (123.4 kg)  12/06/19 271 lb (122.9 kg)     Kidney Function Lab Results  Component Value Date/Time    CREATININE 1.30 01/22/2020 07:28 AM   CREATININE 1.18 08/02/2019 01:09 AM   CREATININE 2.06 (H) 06/14/2016 08:56 AM   CREATININE 1.51 (H) 06/08/2016 12:06 PM   CREATININE 0.9 08/02/2012 08:39 AM   GFR 57.67 (L) 01/22/2020 07:28 AM   GFRNONAA >60 08/02/2019 01:09 AM   GFRAA >60 08/02/2019 01:09 AM    BMP Latest Ref Rng & Units 01/22/2020 08/02/2019 08/01/2019  Glucose 70 - 99 mg/dL 281(H) 184(H) 227(H)  BUN 6 - 23 mg/dL 18 16 22(H)  Creatinine 0.40 - 1.50 mg/dL 1.30 1.18 1.21  Sodium 135 - 145 mEq/L 135 138 136  Potassium 3.5 - 5.1 mEq/L 4.4 3.8 3.4(L)  Chloride 96 - 112 mEq/L 101 107 103  CO2 19 - 32 mEq/L $Remove'27 24 23  'ILogUry$ Calcium 8.4 - 10.5 mg/dL 9.2 8.4(L) 8.5(L)    . Current antihypertensive regimen:  o Losartan 50 mg daily . How often are you checking your Blood Pressure? daily . Current home BP readings:  o 02/09/20-118/70, P 87 o 02/10/20 - 134/77, P 90 o 02/11/20 - 142/81, P 85 o 02/12/20 - 115/68, P 92 o 02/13/20 - 123/73 (AM), P 84 o 02/13/20 - 127/78 (PM), P 85 o 02/14/20 - 140/79, P 82 o 02/15/20 - 147/86, P 77 . What recent interventions/DTPs have been made by any provider to improve Blood Pressure control since last CPP Visit: Losartan was decreased to 50 mg from 100 mg  . Any recent hospitalizations or ED visits since last visit with CPP? No . What diet changes have been made to improve Blood Pressure Control?  o None, patient reports he is meeting with a nutritionist for Diabetes and Hypertension in October. For now he is pretty much eating whatever he wants. . What exercise is being done to improve your Blood Pressure Control?  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   Adherence Review: Is the patient currently on ACE/ARB medication? Yes Does the patient have >5 day gap between last estimated fill dates? No  Martinique Uselman, [place] Catering manager  [phone]

## 2020-02-18 LAB — PSA, TOTAL AND FREE
PSA, % Free: 8 % (calc) — ABNORMAL LOW (ref >25)
PSA, Free: 0.6 ng/mL
PSA, Total: 7.3 ng/mL — ABNORMAL HIGH (ref <=4.0)

## 2020-02-18 LAB — PARATHYROID HORMONE, INTACT (NO CA): PTH: 35 pg/mL (ref 14–64)

## 2020-02-19 ENCOUNTER — Other Ambulatory Visit: Payer: Self-pay | Admitting: Family Medicine

## 2020-02-19 DIAGNOSIS — R972 Elevated prostate specific antigen [PSA]: Secondary | ICD-10-CM

## 2020-02-19 MED ORDER — MAGNESIUM 500 MG PO TABS: 1.0000 | ORAL_TABLET | ORAL | Status: DC

## 2020-02-21 DIAGNOSIS — D631 Anemia in chronic kidney disease: Secondary | ICD-10-CM | POA: Diagnosis not present

## 2020-02-21 DIAGNOSIS — N2581 Secondary hyperparathyroidism of renal origin: Secondary | ICD-10-CM | POA: Diagnosis not present

## 2020-02-21 DIAGNOSIS — E1122 Type 2 diabetes mellitus with diabetic chronic kidney disease: Secondary | ICD-10-CM | POA: Diagnosis not present

## 2020-02-21 DIAGNOSIS — N183 Chronic kidney disease, stage 3 unspecified: Secondary | ICD-10-CM | POA: Diagnosis not present

## 2020-02-21 DIAGNOSIS — I129 Hypertensive chronic kidney disease with stage 1 through stage 4 chronic kidney disease, or unspecified chronic kidney disease: Secondary | ICD-10-CM | POA: Diagnosis not present

## 2020-02-28 ENCOUNTER — Telehealth: Payer: Medicare HMO

## 2020-03-20 DIAGNOSIS — E119 Type 2 diabetes mellitus without complications: Secondary | ICD-10-CM | POA: Diagnosis not present

## 2020-03-20 LAB — HM DIABETES EYE EXAM

## 2020-03-25 ENCOUNTER — Encounter: Payer: Self-pay | Admitting: Family Medicine

## 2020-03-31 DIAGNOSIS — Z48298 Encounter for aftercare following other organ transplant: Secondary | ICD-10-CM | POA: Diagnosis not present

## 2020-03-31 DIAGNOSIS — Z79899 Other long term (current) drug therapy: Secondary | ICD-10-CM | POA: Diagnosis not present

## 2020-03-31 DIAGNOSIS — Z944 Liver transplant status: Secondary | ICD-10-CM | POA: Diagnosis not present

## 2020-04-06 ENCOUNTER — Encounter: Payer: Self-pay | Admitting: Endocrinology

## 2020-04-06 ENCOUNTER — Ambulatory Visit: Payer: Medicare HMO | Admitting: Endocrinology

## 2020-04-06 ENCOUNTER — Other Ambulatory Visit: Payer: Self-pay

## 2020-04-06 VITALS — BP 140/84 | HR 88 | Ht 66.5 in | Wt 265.0 lb

## 2020-04-06 DIAGNOSIS — Z794 Long term (current) use of insulin: Secondary | ICD-10-CM

## 2020-04-06 DIAGNOSIS — E1169 Type 2 diabetes mellitus with other specified complication: Secondary | ICD-10-CM

## 2020-04-06 LAB — POCT GLUCOSE (DEVICE FOR HOME USE): POC Glucose: 414 mg/dl — AB (ref 70–99)

## 2020-04-06 LAB — POCT GLYCOSYLATED HEMOGLOBIN (HGB A1C): Hemoglobin A1C: 11.4 % — AB (ref 4.0–5.6)

## 2020-04-06 MED ORDER — LANTUS SOLOSTAR 100 UNIT/ML ~~LOC~~ SOPN
60.0000 [IU] | PEN_INJECTOR | SUBCUTANEOUS | 3 refills | Status: DC
Start: 2020-04-06 — End: 2020-07-23

## 2020-04-06 NOTE — Patient Instructions (Addendum)
good diet and exercise significantly improve the control of your diabetes.  please let me know if you wish to be referred to a dietician.  high blood sugar is very risky to your health.  you should see an eye doctor and dentist every year.  It is very important to get all recommended vaccinations.  Controlling your blood pressure and cholesterol drastically reduces the damage diabetes does to your body.  Those who smoke should quit.  Please discuss these with your doctor.  check your blood sugar twice a day.  vary the time of day when you check, between before the 3 meals, and at bedtime.  also check if you have symptoms of your blood sugar being too high or too low.  please keep a record of the readings and bring it to your next appointment here (or you can bring the meter itself).  You can write it on any piece of paper.  please call us sooner if your blood sugar goes below 70, or if you have a lot of readings over 200.   We will need to take this complex situation in stages. For now, please: Increase the insulin to 60 units each morning, and:  Please continue the same other diabetes medications.   Please see Dr [name], about the memory loss.   Please come back for a follow-up appointment in 2 weeks.     Here is a list of places that can send you a meter and strips:   [email or web address] [phone] Fax [phone]  [email or web address] [phone] Fax [phone]  [email or web address] [phone] Fax [phone]  [email or web address] [phone] Fax [phone]  [facility] [email or web address] [phone] Fax [phone]  [email or web address] [phone] Fax [phone]  [facility] [email or web address] [phone] Fax [phone]  [email or web address] [phone] Fax [email or web address] [phone] option #1 Fax [phone]  [email or web address] [phone] Fax [phone]  [name] [facility] [phone] Fax [phone]  [name] [email or web address] [phone] Fax [phone]

## 2020-04-06 NOTE — Progress Notes (Signed)
Subjective:    Patient ID: Colton Porter, male    DOB: 22-Aug-1965, 54 y.o.   MRN: [id number]  HPI pt is referred by Dr [name], for diabetes.  Pt states DM was dx'ed in 5784; it is complicated CRI; he has been on insulin since 2019; pt says his diet and exercise are poor; he has never had pancreatitis, pancreatic surgery, severe hypoglycemia or DKA.  No recent steroids.  Pt says he wants to minimize number of meds.  He does not check cbg's, but he does not miss meds.   Past Medical History:  Diagnosis Date  . Alcohol dependence ([place]) 07/11/2012  . Alcoholic cirrhosis of liver with ascites ([place]) 07/2014   s./p transplant 12/2015  . Allergy   . Anemia   . Cellulitis of left leg   . Chronic diastolic heart failure ([place]) 11/01/2013  . CKD (chronic kidney disease) stage 3, GFR 30-59 ml/min (HCC) 11/04/2015  . Diabetes mellitus without complication ([place])   . GERD (gastroesophageal reflux disease)   . Hyperlipidemia   . Hypertension   . Neuropathy   . OSA on CPAP 07/11/2012   HST 07/2013:  AHI 39/hr.    . Pneumonia due to COVID-19 virus 06/2019  . Thrombocytopenia ([place]) 12/15/2011    Past Surgical History:  Procedure Laterality Date  . COLONOSCOPY  08/2013   hyperplastic polyps, hemorrhoids Ardis Hughs)  . 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 FINGER AMPUTATION  1997   left 4th finger - radial arm saw  . LEFT AND RIGHT HEART CATHETERIZATION WITH CORONARY ANGIOGRAM N/A 06/10/2013   Procedure: LEFT AND RIGHT HEART CATHETERIZATION WITH CORONARY ANGIOGRAM;  Surgeon: Blane Ohara, MD;  Location: [facility] CATH LAB;  Service: Cardiovascular;  Laterality: N/A;  . LIVER TRANSPLANTATION  69/6295   alcoholic cirrhosis ([facility] at [facility])    Social History   Socioeconomic History  . Marital status: Married    Spouse name: Not on file  . Number of children: 2  . Years of education: Not on file  . Highest education level: Not on file  Occupational  History  . Occupation: Investment banker, corporate    Comment: no work lately  Tobacco Use  . Smoking status: Never Smoker  . Smokeless tobacco: Former Systems developer    Types: Secondary school teacher  . Vaping Use: Never used  Substance and Sexual Activity  . Alcohol use: No    Alcohol/week: 0.0 standard drinks    Comment: 4-6 drinks daily - NO ETOH SINCE APRIL 2016  . Drug use: Not Currently    Comment: remote use of marijuana in the past, has since quit  . Sexual activity: Not on file  Other Topics Concern  . Not on file  Social History Narrative    Lives with wife   Occupation: full disability after cirrhosis dx, some working    Activity: some yardwork   Diet: good water, good fruit intake, lots of red meats   Social Determinants of Health   Financial Resource Strain: Low Risk   . Difficulty of Paying Living Expenses: Not hard at all  Food Insecurity: No Food Insecurity  . Worried About Charity fundraiser in the Last Year: Never true  . Ran Out of Food in the Last Year: Never true  Transportation Needs: No Transportation Needs  . Lack of Transportation (Medical): No  . Lack of Transportation (Non-Medical): No  Physical Activity: Inactive  . Days of Exercise per Week: 0  days  . Minutes of Exercise per Session: 0 min  Stress: No Stress Concern Present  . Feeling of Stress : Not at all  Social Connections:   . Frequency of Communication with Friends and Family: Not on file  . Frequency of Social Gatherings with Friends and Family: Not on file  . Attends Religious Services: Not on file  . Active Member of Clubs or Organizations: Not on file  . Attends Archivist Meetings: Not on file  . Marital Status: Not on file  Intimate Partner Violence: Not At Risk  . Fear of Current or Ex-Partner: No  . Emotionally Abused: No  . Physically Abused: No  . Sexually Abused: No    Current Outpatient Medications on File Prior to Visit  Medication Sig Dispense Refill  . Accu-Chek FastClix  Lancets MISC Use three times daily to check sugars E65.11, insulin use 100 each 12  . acetaminophen (TYLENOL) 500 MG tablet Take 500 mg by mouth every 6 (six) hours as needed for mild pain.    Marland Kitchen allopurinol (ZYLOPRIM) 300 MG tablet TAKE 1 TABLET BY MOUTH DAILY. 90 tablet 0  . ASPIR-LOW 81 MG EC tablet Take 1 tablet (81 mg total) by mouth daily. 90 tablet 3  . Blood Glucose Monitoring Suppl (ACCU-CHEK GUIDE) w/Device KIT 1 Units by Does not apply route as directed. 1 kit 0  . Cholecalciferol (VITAMIN D3) 2000 units TABS Take 1 tablet by mouth at bedtime.    . docusate sodium (COLACE) 100 MG capsule Take 100 mg by mouth daily as needed for mild constipation.     Marland Kitchen escitalopram (LEXAPRO) 10 MG tablet TAKE 1 TABLET BY MOUTH DAILY. 90 tablet 0  . fluticasone (FLONASE) 50 MCG/ACT nasal spray SPRAY 2 SPRAYS INTO EACH NOSTRIL EVERY DAY 48 mL 0  . furosemide (LASIX) 40 MG tablet Take 1 tablet (40 mg total) by mouth daily. 30 tablet 3  . glimepiride (AMARYL) 2 MG tablet TAKE 1 TABLET BY MOUTH DAILY WITH BREAKFAST 90 tablet 0  . glucose blood (ACCU-CHEK GUIDE) test strip Check sugars three times daily and as needed when feeling ill E11.65, insulin use 100 each 11  . glucose blood test strip Use as instructed to check sugars three times daily E11.65, insulin use 100 each 12  . Insulin Pen Needle (B-D UF III MINI PEN NEEDLES) 31G X 5 MM MISC USE TO INJECT INSULIN DAILY 100 each 3  . loratadine (CLARITIN) 10 MG tablet Take 10 mg by mouth daily.    Marland Kitchen losartan (COZAAR) 50 MG tablet Take 1 tablet (50 mg total) by mouth daily. 30 tablet 1  . lovastatin (MEVACOR) 20 MG tablet Take 1 tablet (20 mg total) by mouth at bedtime. 90 tablet 3  . Magnesium 500 MG TABS Take 1 tablet (500 mg total) by mouth every Monday, Wednesday, and Friday. 30 tablet   . metFORMIN (GLUCOPHAGE XR) 500 MG 24 hr tablet Take 1 tablet (500 mg total) by mouth daily with breakfast. 30 tablet 6  . Multiple Vitamins-Minerals (CVS SPECTRAVITE PO)  Take 1 tablet by mouth at bedtime.    . mycophenolate (MYFORTIC) 360 MG TBEC EC tablet     . Omega-3 Fatty Acids (FISH OIL PO) Take 1 tablet by mouth at bedtime.    Marland Kitchen omeprazole (PRILOSEC) 40 MG capsule TAKE 1 CAPSULE BY MOUTH DAILY. 90 capsule 0  . tacrolimus (PROGRAF) 1 MG capsule Take 1-2 mg by mouth See admin instructions. Take 2 mg by mouth in  the morning, then take 1 mg  by mouth in the evening    . diclofenac Sodium (VOLTAREN) 1 % GEL Apply 2 g topically in the morning and at bedtime. 100 g    No current facility-administered medications on file prior to visit.    Allergies  Allergen Reactions  . Tolmetin Rash    cirrhosis  . Ambien [Zolpidem Tartrate] Other (See Comments)    Over-toxicity from liver failure  . Morphine And Related     Hallucinations.  . Hydrochlorothiazide W-Triamterene Other (See Comments)    REACTION: dizzy, nausea  . Lisinopril Other (See Comments)    REACTION: cough, decreased libido    Family History  Problem Relation Age of Onset  . Stroke Mother   . Emphysema Mother   . Hypertension Father   . Heart disease Father   . Emphysema Father   . Lung cancer Maternal Aunt   . Stroke Paternal Grandmother   . Heart attack Neg Hx   . Diabetes Neg Hx     BP 140/84 (BP Location: Left Arm, Patient Position: Sitting, Cuff Size: Normal)   Pulse 88   Ht 5' 6.5" (1.689 m)   Wt 265 lb (120.2 kg)   SpO2 97%   BMI 42.13 kg/m     Review of Systems denies weight loss, blurry vision, chest pain, n/v, urinary frequency, and depression.  He has slight doe.  He reports memory loss.       Objective:   Physical Exam VITAL SIGNS:  See vs page GENERAL: no distress Pulses: dorsalis pedis intact bilat.   MSK: no deformity of the feet CV: 1+ bilat leg edema Skin:  no ulcer on the feet.  normal color and temp on the feet.  Neuro: sensation is intact to touch on the feet.     Lab Results  Component Value Date   CREATININE 1.30 01/22/2020   BUN 18  01/22/2020   NA 135 01/22/2020   K 4.4 01/22/2020   CL 101 01/22/2020   CO2 27 01/22/2020   Lab Results  Component Value Date   HGBA1C 11.4 (A) 04/06/2020    I have reviewed outside records, and summarized: Pt was noted to have elevated A1c, and referred here.  He was admitted twice in 2021: with coronavirus, and again for hypotension.  BP meds were d/c'ed      Assessment & Plan:  Insulin-requiring type 2 DM, with CRI: severe exacerbation.   Patient Instructions  good diet and exercise significantly improve the control of your diabetes.  please let me know if you wish to be referred to a dietician.  high blood sugar is very risky to your health.  you should see an eye doctor and dentist every year.  It is very important to get all recommended vaccinations.  Controlling your blood pressure and cholesterol drastically reduces the damage diabetes does to your body.  Those who smoke should quit.  Please discuss these with your doctor.  check your blood sugar twice a day.  vary the time of day when you check, between before the 3 meals, and at bedtime.  also check if you have symptoms of your blood sugar being too high or too low.  please keep a record of the readings and bring it to your next appointment here (or you can bring the meter itself).  You can write it on any piece of paper.  please call us sooner if your blood sugar goes below 70, or if you have  a lot of readings over 200.   We will need to take this complex situation in stages. For now, please: Increase the insulin to 60 units each morning, and:  Please continue the same other diabetes medications.   Please see Dr [name], about the memory loss.   Please come back for a follow-up appointment in 2 weeks.     Here is a list of places that can send you a meter and strips:   [email or web address] [phone] Fax [phone]  [email or web address] [phone] Fax  [phone]  [email or web address] [phone] Fax [phone]  [place].com [phone] Fax [phone]  [facility] [email or web address] [phone] Fax [phone]  [place].com [phone] Fax [phone]  [facility] [email or web address] [phone] Fax [phone]  [email or web address] [phone] Fax [email or web address] [phone] option #1 Fax [phone]  [email or web address] [phone] Fax [phone]  [name] [facility] [phone] Fax [phone]  [name] [email or web address] [phone] Fax [phone]

## 2020-04-07 ENCOUNTER — Ambulatory Visit (INDEPENDENT_AMBULATORY_CARE_PROVIDER_SITE_OTHER): Payer: Medicare HMO | Admitting: Family Medicine

## 2020-04-07 ENCOUNTER — Encounter: Payer: Self-pay | Admitting: Family Medicine

## 2020-04-07 ENCOUNTER — Ambulatory Visit (INDEPENDENT_AMBULATORY_CARE_PROVIDER_SITE_OTHER)
Admission: RE | Admit: 2020-04-07 | Discharge: 2020-04-07 | Disposition: A | Discharge status: Home / Self Care | Payer: Medicare HMO | Source: Ambulatory Visit | Attending: Family Medicine | Admitting: Family Medicine

## 2020-04-07 VITALS — BP 130/80 | HR 78 | Temp 97.7°F | Ht 66.5 in | Wt 266.1 lb

## 2020-04-07 DIAGNOSIS — N183 Chronic kidney disease, stage 3 unspecified: Secondary | ICD-10-CM

## 2020-04-07 DIAGNOSIS — R413 Other amnesia: Secondary | ICD-10-CM | POA: Diagnosis not present

## 2020-04-07 DIAGNOSIS — G8929 Other chronic pain: Secondary | ICD-10-CM

## 2020-04-07 DIAGNOSIS — Z794 Long term (current) use of insulin: Secondary | ICD-10-CM | POA: Diagnosis not present

## 2020-04-07 DIAGNOSIS — Z23 Encounter for immunization: Secondary | ICD-10-CM

## 2020-04-07 DIAGNOSIS — J189 Pneumonia, unspecified organism: Secondary | ICD-10-CM | POA: Diagnosis not present

## 2020-04-07 DIAGNOSIS — Z6841 Body Mass Index (BMI) 40.0 and over, adult: Secondary | ICD-10-CM

## 2020-04-07 DIAGNOSIS — E1169 Type 2 diabetes mellitus with other specified complication: Secondary | ICD-10-CM

## 2020-04-07 DIAGNOSIS — M546 Pain in thoracic spine: Secondary | ICD-10-CM

## 2020-04-07 DIAGNOSIS — Z944 Liver transplant status: Secondary | ICD-10-CM

## 2020-04-07 LAB — RENAL FUNCTION PANEL
Albumin: 4.1 g/dL (ref 3.5–5.2)
BUN: 16 mg/dL (ref 6–23)
CO2: 27 mEq/L (ref 19–32)
Calcium: 9.3 mg/dL (ref 8.4–10.5)
Chloride: 99 mEq/L (ref 96–112)
Creatinine, Ser: 1.17 mg/dL (ref 0.40–1.50)
GFR: 70.48 mL/min (ref >60.00)
Glucose, Bld: 358 mg/dL — ABNORMAL HIGH (ref 70–99)
Phosphorus: 2.5 mg/dL (ref 2.3–4.6)
Potassium: 4.3 mEq/L (ref 3.5–5.1)
Sodium: 135 mEq/L (ref 135–145)

## 2020-04-07 LAB — VITAMIN B12: Vitamin B-12: 630 pg/mL (ref 211–911)

## 2020-04-07 LAB — TSH: TSH: 1.1 u[IU]/mL (ref 0.35–4.50)

## 2020-04-07 MED ORDER — METHOCARBAMOL 500 MG PO TABS: 500.0000 mg | ORAL_TABLET | Freq: Two times a day (BID) | ORAL | 0 refills | Status: DC | PRN

## 2020-04-07 NOTE — Assessment & Plan Note (Signed)
Present for months, worsens as day progresses. Anticipate MSK cause. In COVID PNA 06/2019 will update CXR today. Pain is on opposite side as prior rib fracture during COVID PNA. Rx robaxin course PRN.

## 2020-04-07 NOTE — Patient Instructions (Addendum)
Flu shot today.  If interested, check with pharmacy about new 2 shot shingles series (shingrix).  Labs today  Chest xray today  Return in 3-4 months for follow up visit.

## 2020-04-07 NOTE — Assessment & Plan Note (Signed)
Appreciate endo care. Has established with Dr Loanne Drilling with recent lantus titration.

## 2020-04-07 NOTE — Assessment & Plan Note (Signed)
Sees renal Dr Posey Pronto at [facility].  Update renal panel.

## 2020-04-07 NOTE — Progress Notes (Signed)
This visit was conducted in person.  BP 130/80 (BP Location: Left Arm, Patient Position: Sitting, Cuff Size: Large)   Pulse 78   Temp 97.7 F (36.5 C) (Temporal)   Ht 5' 6.5" (1.689 m)   Wt 266 lb 2 oz (120.7 kg)   SpO2 96%   BMI 42.31 kg/m    CC: 2 mo DM f/u visit Subjective:    Patient ID: Colton Porter, male    DOB: 04-17-1966, 54 y.o.   MRN: [id number]  HPI: Colton Porter is a 54 y.o. male presenting on 04/07/2020 for Follow-up (Here for 2 mo f/u.)   DM - does not regularly check sugars. Compliant with antihyperglycemic regimen which includes: amaryl $RemoveBeforeDE'2mg'ibPrTuiJKsGiSXI$  daily, lantus 60u daily, metformin XR $RemoveBefo'500mg'etAAUouJDxW$  daily. Metformin dosing limited by diarrhea. Established with endo Loanne Drilling) yesterday, note reviewed - lantus increased to 60u daily, planned close f/u in 2 wks. Denies low sugars or hypoglycemic symptoms. Denies paresthesias. Foot exam done yesterday. Last diabetic eye exam 02/2020. Pneumovax: 2016. Prevnar: not due. Glucometer brand: accuchek. DSME: completed 08/2017.  Lab Results  Component Value Date   HGBA1C 11.4 (A) 04/06/2020   Diabetic Foot Exam - Simple   No data filed     Lab Results  Component Value Date   MICROALBUR <0.7 08/27/2014    Ongoing R mid back pain for months. Worse with prolonged standing. No shooting pain down leg, numbness or weakness. No fevers, trouble controlling urine or bowels. Has bowel urgency but this is not new - since gallbladder removed. No significant cough, dyspnea or other respiratory symptoms.   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 Notes trouble remember dates, names.  04/07/2020  [place]  Recall 1/3, 3/3 with cue Calculation 5/5 serial 7s      Relevant past medical, surgical, family and social history reviewed and updated as indicated. Interim medical history since our last visit reviewed. Allergies and medications reviewed and updated. Outpatient Medications Prior to Visit    Medication Sig Dispense Refill  . Accu-Chek FastClix Lancets MISC Use three times daily to check sugars E65.11, insulin use 100 each 12  . acetaminophen (TYLENOL) 500 MG tablet Take 500 mg by mouth every 6 (six) hours as needed for mild pain.    Marland Kitchen allopurinol (ZYLOPRIM) 300 MG tablet TAKE 1 TABLET BY MOUTH DAILY. 90 tablet 0  . ASPIR-LOW 81 MG EC tablet Take 1 tablet (81 mg total) by mouth daily. 90 tablet 3  . Blood Glucose Monitoring Suppl (ACCU-CHEK GUIDE) w/Device KIT 1 Units by Does not apply route as directed. 1 kit 0  . Cholecalciferol (VITAMIN D3) 2000 units TABS Take 1 tablet by mouth at bedtime.    . diclofenac Sodium (VOLTAREN) 1 % GEL Apply 2 g topically in the morning and at bedtime. 100 g   . docusate sodium (COLACE) 100 MG capsule Take 100 mg by mouth daily as needed for mild constipation.     Marland Kitchen escitalopram (LEXAPRO) 10 MG tablet TAKE 1 TABLET BY MOUTH DAILY. 90 tablet 0  . fluticasone (FLONASE) 50 MCG/ACT nasal spray SPRAY 2 SPRAYS INTO EACH NOSTRIL EVERY DAY 48 mL 0  . furosemide (LASIX) 40 MG tablet Take 1 tablet (40 mg total) by mouth daily. 30 tablet 3  . glimepiride (AMARYL) 2 MG tablet TAKE 1 TABLET BY MOUTH DAILY WITH BREAKFAST 90 tablet 0  . glucose blood (ACCU-CHEK GUIDE) test strip Check sugars three times daily and as needed when feeling ill  E11.65, insulin use 100 each 11  . glucose blood test strip Use as instructed to check sugars three times daily E11.65, insulin use 100 each 12  . insulin glargine (LANTUS SOLOSTAR) 100 UNIT/ML Solostar Pen Inject 60 Units into the skin every morning. And pen needles 1/day 60 mL 3  . Insulin Pen Needle (B-D UF III MINI PEN NEEDLES) 31G X 5 MM MISC USE TO INJECT INSULIN DAILY 100 each 3  . loratadine (CLARITIN) 10 MG tablet Take 10 mg by mouth daily.    Marland Kitchen losartan (COZAAR) 50 MG tablet Take 1 tablet (50 mg total) by mouth daily. 30 tablet 1  . lovastatin (MEVACOR) 20 MG tablet Take 1 tablet (20 mg total) by mouth at bedtime. 90  tablet 3  . Magnesium 500 MG TABS Take 1 tablet (500 mg total) by mouth every Monday, Wednesday, and Friday. 30 tablet   . metFORMIN (GLUCOPHAGE XR) 500 MG 24 hr tablet Take 1 tablet (500 mg total) by mouth daily with breakfast. 30 tablet 6  . Multiple Vitamins-Minerals ([employer] SPECTRAVITE PO) Take 1 tablet by mouth at bedtime.    . mycophenolate (MYFORTIC) 360 MG TBEC EC tablet     . Omega-3 Fatty Acids (FISH OIL PO) Take 1 tablet by mouth at bedtime.    Marland Kitchen omeprazole (PRILOSEC) 40 MG capsule TAKE 1 CAPSULE BY MOUTH DAILY. 90 capsule 0  . tacrolimus (PROGRAF) 1 MG capsule Take 1-2 mg by mouth See admin instructions. Take 2 mg by mouth in the morning, then take 1 mg  by mouth in the evening     No facility-administered medications prior to visit.     Per HPI unless specifically indicated in ROS section below Review of Systems Objective:  BP 130/80 (BP Location: Left Arm, Patient Position: Sitting, Cuff Size: Large)   Pulse 78   Temp 97.7 F (36.5 C) (Temporal)   Ht 5' 6.5" (1.689 m)   Wt 266 lb 2 oz (120.7 kg)   SpO2 96%   BMI 42.31 kg/m   Wt Readings from Last 3 Encounters:  04/07/20 266 lb 2 oz (120.7 kg)  04/06/20 265 lb (120.2 kg)  02/06/20 270 lb (122.5 kg)      Physical Exam Vitals and nursing note reviewed.  Constitutional:      General: He is not in acute distress.    Appearance: Normal appearance. He is well-developed. He is obese. He is not ill-appearing.  HENT:     Head: Normocephalic and atraumatic.  Eyes:     General: No scleral icterus.    Extraocular Movements: Extraocular movements intact.     Conjunctiva/sclera: Conjunctivae normal.     Pupils: Pupils are equal, round, and reactive to light.  Cardiovascular:     Rate and Rhythm: Normal rate and regular rhythm.     Pulses: Normal pulses.     Heart sounds: Normal heart sounds. No murmur heard.   Pulmonary:     Effort: Pulmonary effort is normal. No respiratory distress.     Breath sounds: Normal breath  sounds. No wheezing, rhonchi or rales.       Comments: Site of tenderness Chest:     Chest wall: No mass.     Comments: Tender to palpation along R posterior ribcage  Abdominal:     General: Bowel sounds are normal.     Palpations: Abdomen is soft. There is no mass.     Tenderness: There is no abdominal tenderness. There is no right CVA tenderness, left  CVA tenderness, guarding or rebound.     Hernia: No hernia is present.  Musculoskeletal:     Cervical back: Normal range of motion and neck supple.     Right lower leg: No edema.     Left lower leg: No edema.     Comments: See HPI for foot exam if done  Lymphadenopathy:     Cervical: No cervical adenopathy.  Skin:    General: Skin is warm and dry.     Findings: No rash.  Neurological:     Mental Status: He is alert.     Cranial Nerves: Cranial nerves are intact.     Sensory: Sensation is intact.     Motor: Motor function is intact.     Coordination: Coordination is intact.     Comments:  CN 2-12 intact FTN intact EOMI 5/5 strength BLE  Psychiatric:        Mood and Affect: Mood normal.        Behavior: Behavior normal.       Results for orders placed or performed in visit on 04/06/20  POCT glycosylated hemoglobin (Hb A1C)  Result Value Ref Range   Hemoglobin A1C 11.4 (A) 4.0 - 5.6 %   HbA1c POC (<> result, manual entry)     HbA1c, POC (prediabetic range)     HbA1c, POC (controlled diabetic range)    POCT Glucose (Device for Home Use)  Result Value Ref Range   Glucose Fasting, POC     POC Glucose 414 (A) 70 - 99 mg/dl   Assessment & Plan:  This visit occurred during the SARS-CoV-2 public health emergency.  Safety protocols were in place, including screening questions prior to the visit, additional usage of staff PPE, and extensive cleaning of exam room while observing appropriate contact time as indicated for disinfecting solutions.   Problem List Items Addressed This Visit    Type 2 diabetes mellitus with other  specified complication ([place]) - Primary (Chronic)    Appreciate endo care. Has established with Dr Loanne Drilling with recent lantus titration.       Status post liver transplant ([place]) (Chronic)   Morbid obesity with BMI of 40.0-44.9, adult ([place])   Memory loss    Stable limited testing today.  eval for reversible causes of memory trouble today with labs (RPR, B12, TSH).       Relevant Orders   TSH   Vitamin B12   RPR   CKD (chronic kidney disease) stage 3, GFR 30-59 ml/min (HCC)    Sees renal Dr Posey Pronto at [facility].  Update renal panel.       Relevant Orders   Renal function panel   Chronic right-sided thoracic back pain    Present for months, worsens as day progresses. Anticipate MSK cause. In COVID PNA 06/2019 will update CXR today. Pain is on opposite side as prior rib fracture during COVID PNA. Rx robaxin course PRN.       Relevant Medications   methocarbamol (ROBAXIN) 500 MG tablet   Other Relevant Orders   DG Chest 2 View    Other Visit Diagnoses    Need for influenza vaccination       Relevant Orders   Flu Vaccine QUAD 36+ mos IM (Completed)       Meds ordered this encounter  Medications  . methocarbamol (ROBAXIN) 500 MG tablet    Sig: Take 1 tablet (500 mg total) by mouth 2 (two) times daily as needed for muscle spasms (sedation precautions).  Dispense:  30 tablet    Refill:  0   Orders Placed This Encounter  Procedures  . DG Chest 2 View    Standing Status:   Future    Number of Occurrences:   1    Standing Expiration Date:   04/07/2021    Order Specific Question:   Reason for Exam (SYMPTOM  OR DIAGNOSIS REQUIRED)    Answer:   ongoing R back pain into side, h/o COVID PNA 06/2019    Order Specific Question:   Preferred imaging location?    Answer:   [facility]  . Flu Vaccine QUAD 36+ mos IM  . TSH  . Vitamin B12  . RPR  . Renal function panel    Patient Instructions  Flu shot today.  If interested, check with pharmacy about new 2 shot shingles series  (shingrix).  Labs today  Chest xray today  Return in 3-4 months for follow up visit.   Follow up plan: Return in about 4 months (around 08/08/2020) for follow up visit.  Ria Bush, MD

## 2020-04-07 NOTE — Assessment & Plan Note (Addendum)
Stable limited testing today.  eval for reversible causes of memory trouble today with labs (RPR, B12, TSH).

## 2020-04-08 ENCOUNTER — Encounter: Payer: Self-pay | Admitting: Family Medicine

## 2020-04-08 LAB — RPR: RPR Ser Ql: NONREACTIVE

## 2020-04-13 ENCOUNTER — Other Ambulatory Visit: Payer: Self-pay | Admitting: Family Medicine

## 2020-04-13 ENCOUNTER — Encounter: Payer: Self-pay | Admitting: Family Medicine

## 2020-04-13 ENCOUNTER — Other Ambulatory Visit: Payer: Self-pay

## 2020-04-13 MED ORDER — LOSARTAN POTASSIUM 50 MG PO TABS
50.0000 mg | ORAL_TABLET | Freq: Every day | ORAL | 1 refills | Status: DC
Start: 2020-04-13 — End: 2021-03-15

## 2020-04-13 MED ORDER — ALLOPURINOL 300 MG PO TABS
300.0000 mg | ORAL_TABLET | Freq: Every day | ORAL | 0 refills | Status: DC
Start: 2020-04-13 — End: 2021-06-10

## 2020-04-13 NOTE — Telephone Encounter (Signed)
Last OV 04/07/20 Last fill 01/07/20 #90/0

## 2020-04-14 ENCOUNTER — Other Ambulatory Visit: Payer: Self-pay

## 2020-04-14 NOTE — Telephone Encounter (Signed)
Results already released via mychart.

## 2020-04-14 NOTE — Telephone Encounter (Signed)
Last OV 04/07/20 Last fill for Omeprazole 01/07/20  #90/0 Last fill for Lexopro 01/07/20  #90/0 Other 2 medications filled 04/13/20 Please advise.

## 2020-04-15 NOTE — Telephone Encounter (Signed)
ERx 

## 2020-04-22 ENCOUNTER — Ambulatory Visit: Payer: Medicare HMO | Admitting: Endocrinology

## 2020-04-22 ENCOUNTER — Other Ambulatory Visit: Payer: Self-pay

## 2020-04-22 ENCOUNTER — Encounter: Payer: Self-pay | Admitting: Endocrinology

## 2020-04-22 VITALS — BP 130/82 | HR 84 | Ht 66.5 in | Wt 266.0 lb

## 2020-04-22 DIAGNOSIS — Z794 Long term (current) use of insulin: Secondary | ICD-10-CM | POA: Diagnosis not present

## 2020-04-22 DIAGNOSIS — E1169 Type 2 diabetes mellitus with other specified complication: Secondary | ICD-10-CM

## 2020-04-22 NOTE — Progress Notes (Signed)
Subjective:    Patient ID: Colton Porter, male    DOB: 1965/09/01, 54 y.o.   MRN: [id number]  HPI Pt returns for f/u of diabetes mellitus: DM type: Insulin-requiring type 2 Dx'ed: 8657 Complications: CRI Therapy: insulin since 2019 DKA: never Severe hypoglycemia: never Pancreatitis: never Pancreatic imaging: normal on 2021 CT SDOH: Pt says he wants to minimize number of meds; memory loss complicates rx (so he is not a candidate for multiple daily injections).  Other: he does not check cbg's Interval history: He has a meter, but he does not check cbg's. Pt says he takes insulin as rx'ed Past Medical History:  Diagnosis Date  . Alcohol dependence ([place]) 07/11/2012  . Alcoholic cirrhosis of liver with ascites ([place]) 07/2014   s./p transplant 12/2015  . Allergy   . Anemia   . Cellulitis of left leg   . Chronic diastolic heart failure ([place]) 11/01/2013  . CKD (chronic kidney disease) stage 3, GFR 30-59 ml/min (HCC) 11/04/2015  . Diabetes mellitus without complication ([place])   . GERD (gastroesophageal reflux disease)   . Hyperlipidemia   . Hypertension   . Neuropathy   . OSA on CPAP 07/11/2012   HST 07/2013:  AHI 39/hr.    . Pneumonia due to COVID-19 virus 06/2019  . Thrombocytopenia ([place]) 12/15/2011    Past Surgical History:  Procedure Laterality Date  . COLONOSCOPY  08/2013   hyperplastic polyps, hemorrhoids Ardis Hughs)  . 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 FINGER AMPUTATION  1997   left 4th finger - radial arm saw  . LEFT AND RIGHT HEART CATHETERIZATION WITH CORONARY ANGIOGRAM N/A 06/10/2013   Procedure: LEFT AND RIGHT HEART CATHETERIZATION WITH CORONARY ANGIOGRAM;  Surgeon: Blane Ohara, MD;  Location: [facility] CATH LAB;  Service: Cardiovascular;  Laterality: N/A;  . LIVER TRANSPLANTATION  [id number]   alcoholic cirrhosis ([facility] at [facility])    Social History   Socioeconomic History  . Marital status: Married    Spouse name: Not  on file  . Number of children: 2  . Years of education: Not on file  . Highest education level: Not on file  Occupational History  . Occupation: Investment banker, corporate    Comment: no work lately  Tobacco Use  . Smoking status: Never Smoker  . Smokeless tobacco: Former Systems developer    Types: Secondary school teacher  . Vaping Use: Never used  Substance and Sexual Activity  . Alcohol use: No    Alcohol/week: 0.0 standard drinks    Comment: 4-6 drinks daily - NO ETOH SINCE APRIL 2016  . Drug use: Not Currently    Comment: remote use of marijuana in the past, has since quit  . Sexual activity: Not on file  Other Topics Concern  . Not on file  Social History Narrative    Lives with wife   Occupation: full disability after cirrhosis dx, some working    Activity: some yardwork   Diet: good water, good fruit intake, lots of red meats   Social Determinants of Health   Financial Resource Strain: Low Risk   . Difficulty of Paying Living Expenses: Not hard at all  Food Insecurity: No Food Insecurity  . Worried About Charity fundraiser in the Last Year: Never true  . Ran Out of Food in the Last Year: Never true  Transportation Needs: No Transportation Needs  . Lack of Transportation (Medical): No  . Lack of Transportation (Non-Medical): No  Physical Activity:  Inactive  . Days of Exercise per Week: 0 days  . Minutes of Exercise per Session: 0 min  Stress: No Stress Concern Present  . Feeling of Stress : Not at all  Social Connections:   . Frequency of Communication with Friends and Family: Not on file  . Frequency of Social Gatherings with Friends and Family: Not on file  . Attends Religious Services: Not on file  . Active Member of Clubs or Organizations: Not on file  . Attends Archivist Meetings: Not on file  . Marital Status: Not on file  Intimate Partner Violence: Not At Risk  . Fear of Current or Ex-Partner: No  . Emotionally Abused: No  . Physically Abused: No  . Sexually  Abused: No    Current Outpatient Medications on File Prior to Visit  Medication Sig Dispense Refill  . Accu-Chek FastClix Lancets MISC Use three times daily to check sugars E65.11, insulin use 100 each 12  . acetaminophen (TYLENOL) 500 MG tablet Take 500 mg by mouth every 6 (six) hours as needed for mild pain.    Marland Kitchen allopurinol (ZYLOPRIM) 300 MG tablet TAKE 1 TABLET EVERY DAY 90 tablet 3  . allopurinol (ZYLOPRIM) 300 MG tablet Take 1 tablet (300 mg total) by mouth daily. 90 tablet 0  . ASPIR-LOW 81 MG EC tablet Take 1 tablet (81 mg total) by mouth daily. 90 tablet 3  . Blood Glucose Monitoring Suppl (ACCU-CHEK GUIDE) w/Device KIT 1 Units by Does not apply route as directed. 1 kit 0  . Cholecalciferol (VITAMIN D3) 2000 units TABS Take 1 tablet by mouth at bedtime.    . diclofenac Sodium (VOLTAREN) 1 % GEL Apply 2 g topically in the morning and at bedtime. 100 g   . docusate sodium (COLACE) 100 MG capsule Take 100 mg by mouth daily as needed for mild constipation.     Marland Kitchen escitalopram (LEXAPRO) 10 MG tablet TAKE 1 TABLET EVERY DAY 90 tablet 3  . fluticasone (FLONASE) 50 MCG/ACT nasal spray SPRAY 2 SPRAYS INTO EACH NOSTRIL EVERY DAY 48 mL 0  . furosemide (LASIX) 40 MG tablet Take 1 tablet (40 mg total) by mouth daily. 30 tablet 3  . glimepiride (AMARYL) 2 MG tablet TAKE 1 TABLET BY MOUTH DAILY WITH BREAKFAST 90 tablet 0  . glucose blood (ACCU-CHEK GUIDE) test strip Check sugars three times daily and as needed when feeling ill E11.65, insulin use 100 each 11  . glucose blood test strip Use as instructed to check sugars three times daily E11.65, insulin use 100 each 12  . insulin glargine (LANTUS SOLOSTAR) 100 UNIT/ML Solostar Pen Inject 60 Units into the skin every morning. And pen needles 1/day 60 mL 3  . Insulin Pen Needle (B-D UF III MINI PEN NEEDLES) 31G X 5 MM MISC USE TO INJECT INSULIN DAILY 100 each 3  . loratadine (CLARITIN) 10 MG tablet Take 10 mg by mouth daily.    Marland Kitchen losartan (COZAAR) 50 MG  tablet TAKE 1 TABLET EVERY DAY 90 tablet 3  . losartan (COZAAR) 50 MG tablet Take 1 tablet (50 mg total) by mouth daily. 30 tablet 1  . lovastatin (MEVACOR) 20 MG tablet Take 1 tablet (20 mg total) by mouth at bedtime. 90 tablet 3  . Magnesium 500 MG TABS Take 1 tablet (500 mg total) by mouth every Monday, Wednesday, and Friday. 30 tablet   . metFORMIN (GLUCOPHAGE XR) 500 MG 24 hr tablet Take 1 tablet (500 mg total) by mouth daily  with breakfast. 30 tablet 6  . methocarbamol (ROBAXIN) 500 MG tablet Take 1 tablet (500 mg total) by mouth 2 (two) times daily as needed for muscle spasms (sedation precautions). 30 tablet 0  . Multiple Vitamins-Minerals ([employer]S SPECTRAVITE PO) Take 1 tablet by mouth at bedtime.    . mycophenolate (MYFORTIC) 360 MG TBEC EC tablet     . Omega-3 Fatty Acids (FISH OIL PO) Take 1 tablet by mouth at bedtime.    . omeprazole (PRILOSEC) 40 MG capsule TAKE 1 CAPSULE EVERY DAY 90 capsule 3  . tacrolimus (PROGRAF) 1 MG capsule Take 1-2 mg by mouth See admin instructions. Take 2 mg by mouth in the morning, then take 1 mg  by mouth in the evening     No current facility-administered medications on file prior to visit.    Allergies  Allergen Reactions  . Tolmetin Rash    cirrhosis  . Ambien [Zolpidem Tartrate] Other (See Comments)    Over-toxicity from liver failure  . Morphine And Related     Hallucinations.  . Hydrochlorothiazide W-Triamterene Other (See Comments)    REACTION: dizzy, nausea  . Lisinopril Other (See Comments)    REACTION: cough, decreased libido    Family History  Problem Relation Age of Onset  . Stroke Mother   . Emphysema Mother   . Hypertension Father   . Heart disease Father   . Emphysema Father   . Lung cancer Maternal Aunt   . Stroke Paternal Grandmother   . Heart attack Neg Hx   . Diabetes Neg Hx     BP 130/82   Pulse 84   Ht 5' 6.5" (1.689 m)   Wt 266 lb (120.7 kg)   SpO2 96%   BMI 42.29 kg/m    Review of Systems He denies  hypoglycemia.      Objective:   Physical Exam VITAL SIGNS:  See vs page GENERAL: no distress Pulses: dorsalis pedis intact bilat.   MSK: no deformity of the feet CV: trace bilat leg edema Skin:  no ulcer on the feet.  normal color and temp on the feet. Neuro: sensation is intact to touch on the feet   Lab Results  Component Value Date   HGBA1C 11.4 (A) 04/06/2020       Assessment & Plan:  Insulin-requiring type 2 DM: Please increase the insulin to 80 units each morning. Noncompliance with cbg recording: we'll have to base rx on A1c or fructosamine instead 

## 2020-04-22 NOTE — Patient Instructions (Addendum)
check your blood sugar twice a day.  vary the time of day when you check, between before the 3 meals, and at bedtime.  also check if you have symptoms of your blood sugar being too high or too low.  please keep a record of the readings and bring it to your next appointment here (or you can bring the meter itself).  You can write it on any piece of paper.  please call us sooner if your blood sugar goes below 70, or if you have a lot of readings over 200.   We will need to take this complex situation in stages.   A different type of diabetes blood test is requested for you today.  We'll let you know about the results.   Please come back for a follow-up appointment in 3 months.     Hypoglycemia Hypoglycemia is when the sugar (glucose) level in your blood is too low. Signs of low blood sugar may include:  Feeling: ? Hungry. ? Worried or nervous (anxious). ? Sweaty and clammy. ? Confused. ? Dizzy. ? Sleepy. ? Sick to your stomach (nauseous).  Having: ? A fast heartbeat. ? A headache. ? A change in your vision. ? Tingling or no feeling (numbness) around your mouth, lips, or tongue. ? Jerky movements that you cannot control (seizure).  Having trouble with: ? Moving (coordination). ? Sleeping. ? Passing out (fainting). ? Getting upset easily (irritability). Low blood sugar can happen to people who have diabetes and people who do not have diabetes. Low blood sugar can happen quickly, and it can be an emergency. Treating low blood sugar Low blood sugar is often treated by eating or drinking something sugary right away, such as:  Fruit juice, 4-6 oz (120-150 mL).  Regular soda (not diet soda), 4-6 oz (120-150 mL).  Low-fat milk, 4 oz (120 mL).  Several pieces of hard candy.  Sugar or honey, 1 Tbsp (15 mL). Treating low blood sugar if you have diabetes If you can think clearly and swallow safely, follow the 15:15 rule:  Take 15 grams of a fast-acting carb (carbohydrate). Talk with  your doctor about how much you should take.  Always keep a source of fast-acting carb with you, such as: ? Sugar tablets (glucose pills). Take 3-4 pills. ? 6-8 pieces of hard candy. ? 4-6 oz (120-150 mL) of fruit juice. ? 4-6 oz (120-150 mL) of regular (not diet) soda. ? 1 Tbsp (15 mL) honey or sugar.  Check your blood sugar 15 minutes after you take the carb.  If your blood sugar is still at or below 70 mg/dL (3.9 mmol/L), take 15 grams of a carb again.  If your blood sugar does not go above 70 mg/dL (3.9 mmol/L) after 3 tries, get help right away.  After your blood sugar goes back to normal, eat a meal or a snack within 1 hour.  Treating very low blood sugar If your blood sugar is at or below 54 mg/dL (3 mmol/L), you have very low blood sugar (severe hypoglycemia). This may also cause:  Passing out.  Jerky movements you cannot control (seizure).  Losing consciousness (coma). This is an emergency. Do not wait to see if the symptoms will go away. Get medical help right away. Call your local emergency services (911 in the [place] Do not drive yourself to the hospital. If you have very low blood sugar and you cannot eat or drink, you may need a glucagon shot (injection). A family member or friend  should learn how to check your blood sugar and how to give you a glucagon shot. Ask your doctor if you need to have a glucagon shot kit at home. Follow these instructions at home: General instructions  Take over-the-counter and prescription medicines only as told by your doctor.  Stay aware of your blood sugar as told by your doctor.  Limit alcohol intake to no more than 1 drink a day for nonpregnant women and 2 drinks a day for men. One drink equals 12 oz of beer (355 mL), 5 oz of wine (148 mL), or 1 oz of hard liquor (44 mL).  Keep all follow-up visits as told by your doctor. This is important. If you have diabetes:   Follow your diabetes care plan as told by your doctor. Make sure  you: ? Know the signs of low blood sugar. ? Take your medicines as told. ? Follow your exercise and meal plan. ? Eat on time. Do not skip meals. ? Check your blood sugar as often as told by your doctor. Always check it before and after exercise. ? Follow your sick day plan when you cannot eat or drink normally. Make this plan ahead of time with your doctor.  Share your diabetes care plan with: ? Your work or school. ? People you live with.  Check your pee (urine) for ketones: ? When you are sick. ? As told by your doctor.  Carry a card or wear jewelry that says you have diabetes. Contact a doctor if:  You have trouble keeping your blood sugar in your target range.  You have low blood sugar often. Get help right away if:  You still have symptoms after you eat or drink something sugary.  Your blood sugar is at or below 54 mg/dL (3 mmol/L).  You have jerky movements that you cannot control.  You pass out. These symptoms may be an emergency. Do not wait to see if the symptoms will go away. Get medical help right away. Call your local emergency services (911 in the [place] Do not drive yourself to the hospital. Summary  Hypoglycemia happens when the level of sugar (glucose) in your blood is too low.  Low blood sugar can happen to people who have diabetes and people who do not have diabetes. Low blood sugar can happen quickly, and it can be an emergency.  Make sure you know the signs of low blood sugar and know how to treat it.  Always keep a source of sugar (fast-acting carb) with you to treat low blood sugar. This information is not intended to replace advice given to you by your health care provider. Make sure you discuss any questions you have with your health care provider. Document Revised: 10/04/2018 Document Reviewed: 07/17/2015 Elsevier Patient Education  2020 [employer].

## 2020-04-23 IMAGING — CT CT L SPINE W/O CM
4 of 7 series · 15 of 33 positions shown, 16 images · non-contrast
Comparison: None.

CLINICAL DATA: Left flank pain.  History of liver transplant.

EXAM:
CT LUMBAR SPINE WITHOUT CONTRAST
TECHNIQUE: Multidetector CT imaging of the lumbar spine was performed without
intravenous contrast administration. Multiplanar CT image
reconstructions were also generated.

[Series 3: abd/ pelvis 5.0 i30f 2 · axial · 0.97mm/px · z∈[+811,+1086]mm · 4 of 93 slices shown, 5 images]
[im 19/93  soft-tissue]
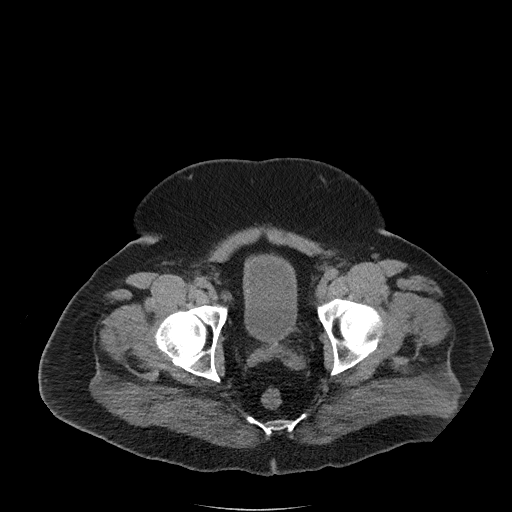
[im 19/93  bone]
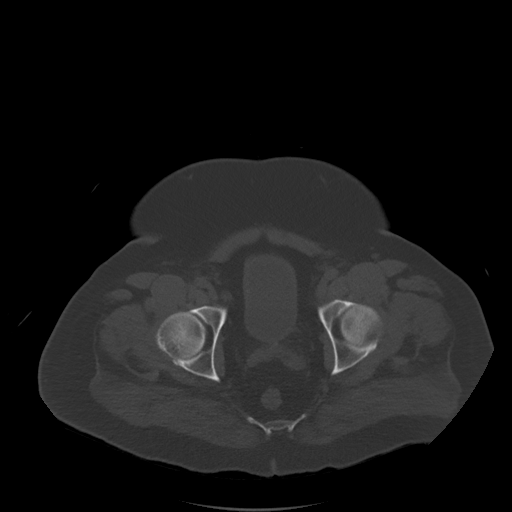
[im 37/93  bone]
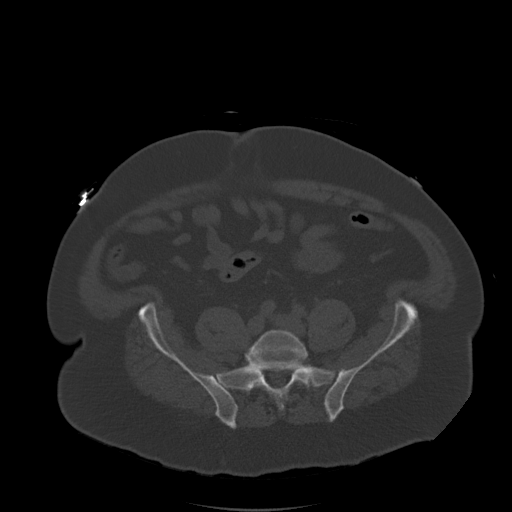
[im 56/93  bone]
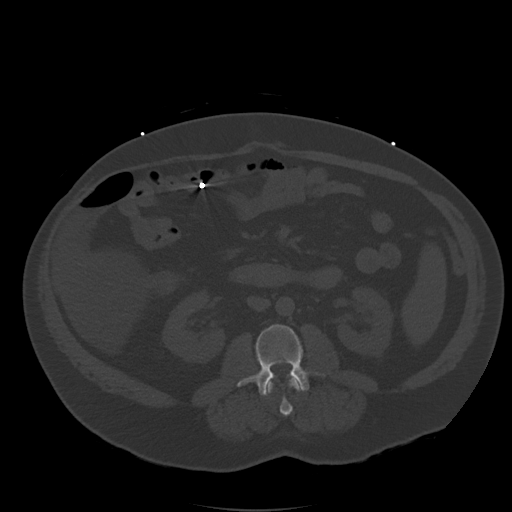
[im 74/93  bone]
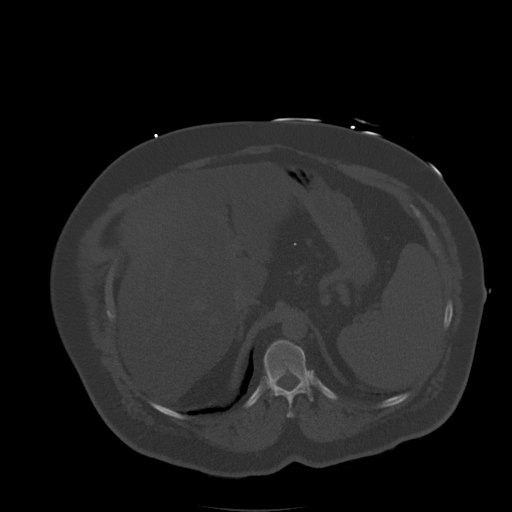

[Series 6: cor st · coronal · 0.93mm/px · 1 of 119 slices shown]
[im 60/119  bone]
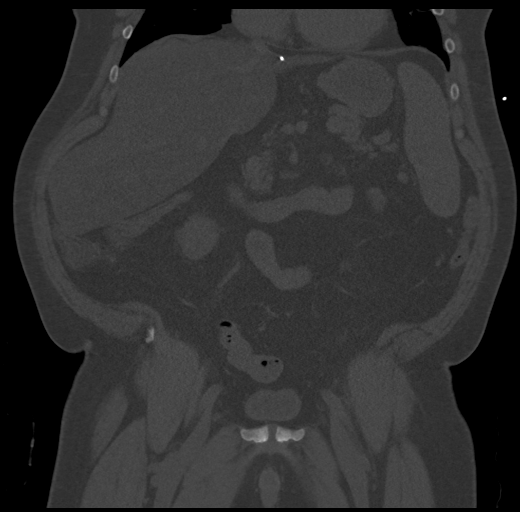

[Series 7: sag st · sagittal · 0.68mm/px · 5 of 156 slices shown]
[im 23/156  bone]
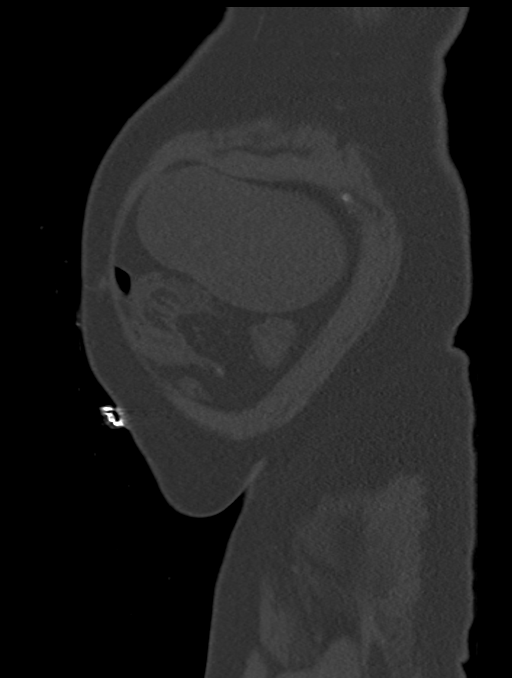
[im 45/156  bone]
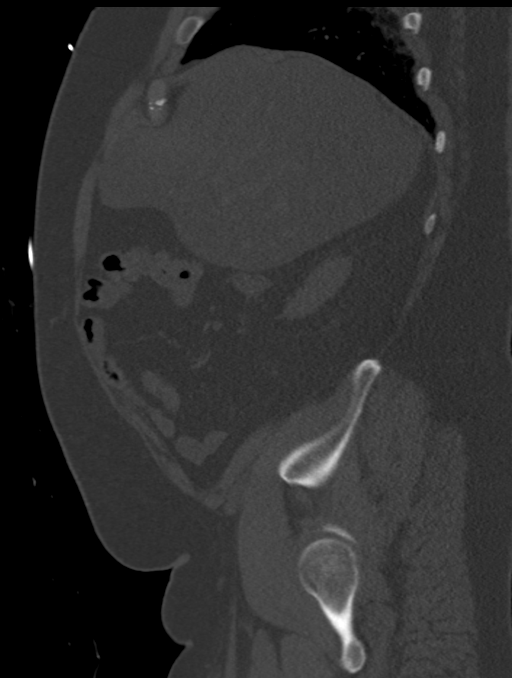
[im 67/156  bone]
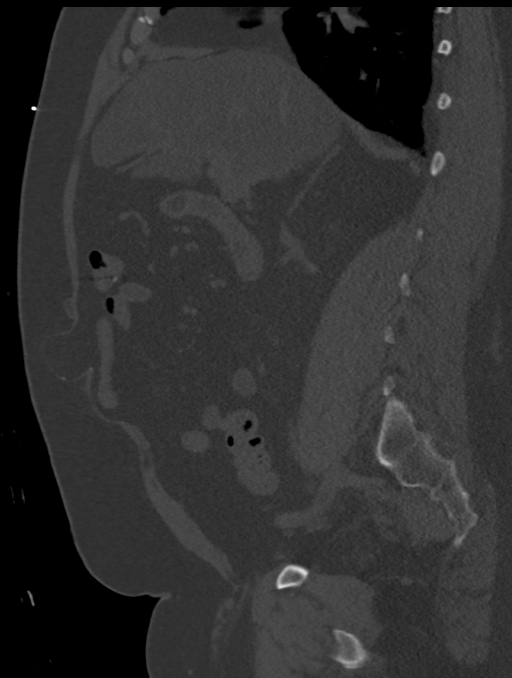
[im 89/156  bone]
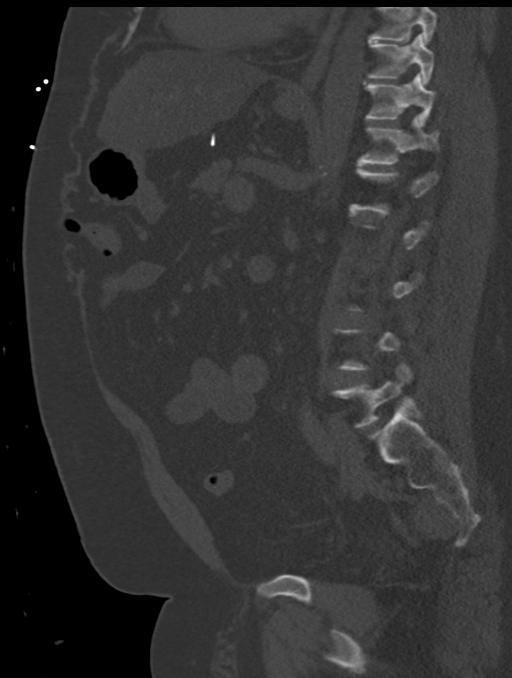
[im 111/156  bone]
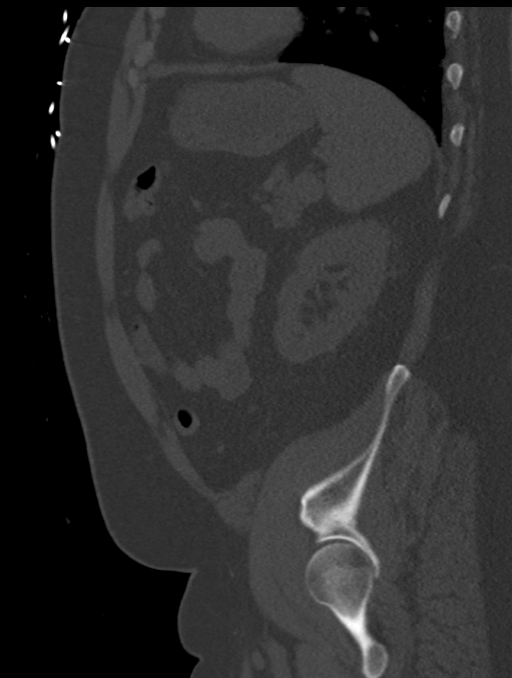

[Series 8: lspine st · axial · 0.35mm/px · z∈[+890,+1062]mm · 5 of 130 slices shown]
[im 22/130  bone]
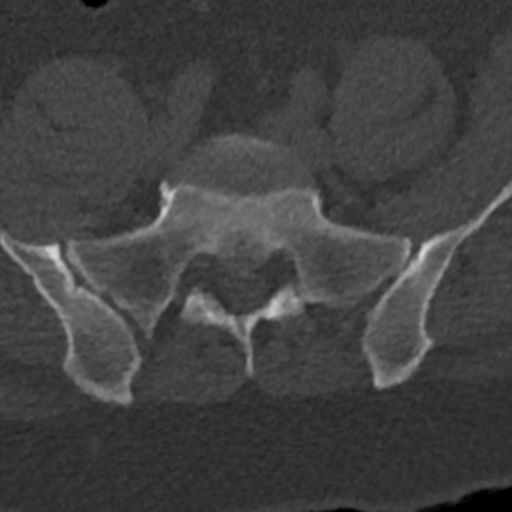
[im 44/130  bone]
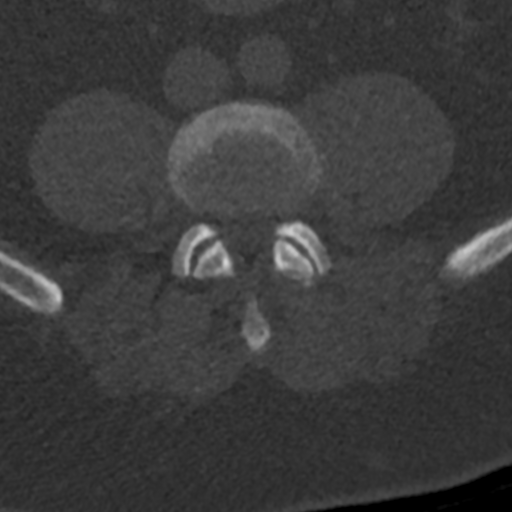
[im 65/130  bone]
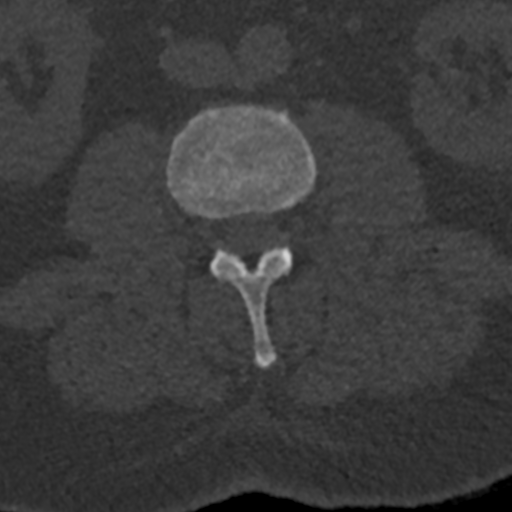
[im 87/130  bone]
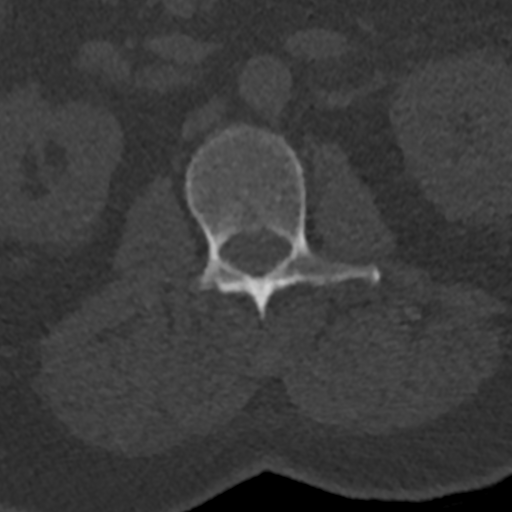
[im 108/130  bone]
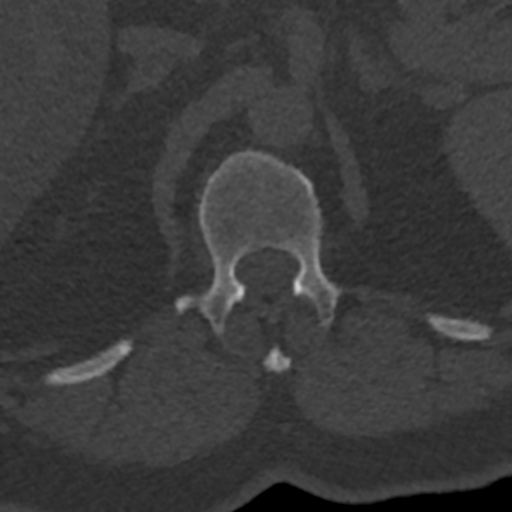

[15 of 33 positions shown; findings below may reference images not displayed]

FINDINGS: Segmentation: Normal

Alignment: Normal

Vertebrae: Negative for lumbar fracture or mass.

Paraspinal and other soft tissues: Negative for paraspinous mass or
adenopathy.

Disc levels: L1-2: Mild degenerative change.  Negative for stenosis

L2-3: Mild disc and mild facet degeneration.  Negative for stenosis

L3-4: Mild disc and mild facet degeneration.  Negative for stenosis

L4-5: Mild disc bulging and mild facet degeneration. Negative for
stenosis

L5-S1: Negative
IMPRESSION: Mild lumbar degenerative change. No acute abnormality. Negative for
neural impingement or stenosis.

## 2020-04-24 LAB — FRUCTOSAMINE: Fructosamine: 436 umol/L — ABNORMAL HIGH (ref 205–285)

## 2020-04-29 ENCOUNTER — Ambulatory Visit: Payer: Medicare HMO

## 2020-04-29 ENCOUNTER — Other Ambulatory Visit: Payer: Self-pay

## 2020-04-29 DIAGNOSIS — Z794 Long term (current) use of insulin: Secondary | ICD-10-CM

## 2020-04-29 DIAGNOSIS — E1169 Type 2 diabetes mellitus with other specified complication: Secondary | ICD-10-CM

## 2020-04-29 DIAGNOSIS — I10 Essential (primary) hypertension: Secondary | ICD-10-CM

## 2020-04-29 NOTE — Chronic Care Management (AMB) (Signed)
Chronic Care Management Pharmacy  Name: Colton Porter  MRN: [id number] DOB: 05-Jun-1966  Chief Complaint/ HPI  [name],  54 y.o., male presents for their Follow-Up CCM visit with the clinical pharmacist via telephone.  PCP : Ria Bush, MD   Their chronic conditions include: hypertension, heart failure, orthostatic hypotension, allergic rhinitis, type 2 diabetes, dyslipidemia, gout, depression, alcohol cirrhosis/status post liver transplant, portal hypertensive gastropathy, neuropathy, CKD  Last CCM 01/26/20: changed metformin 500 mg to XR 500 mg  Office visits/Consults:  04/22/20: Endocrinology - increased Lantus from 60 to 80 units daily, not checking home BG  04/07/20: PCP visit - established care with endo yesterday, compliant to regimen, evaluation of memory loss assessment, RTC 4 months   04/06/20: Endocrinology - initial consult, increase Lantus to 60 units   02/06/20: PCP visit - refer to endocrinology for DM management, switch metformin to ER, dizziness on losartan 100 mg, decrease to 50 mg; lovastatin increased to daily last month per liver clinic, LDL at goal, TG elevated, consider colestipol if ongoing diarrhea   01/01/20: PCP visit - DM, has not started higher dose metformin yet, unsure if taking glimepiride, on chronic prednisone s/p liver transplant; liver clinic recommended daily statin, chronic gout with possible gout flare today, restart voltaren gel TID, if ineffective may fill prednisone 7 day taper, HFpEF, saw cardio and started lasix 40 mg daily   12/06/19: Cardiology - denies LLE, complaint to CPAP, denies SOB and chat pain; mild LLE on exam, restart Lasix 40 mg daily, HTN uncontrolled, increase losartan to 100 mg daily, obesity, goal of 50 lb weight loss, not on statin due to liver transplant   Current Outpatient Medications on File Prior to Visit  Medication Sig Dispense Refill  . Accu-Chek FastClix Lancets MISC Use three times daily to check  sugars E65.11, insulin use 100 each 12  . acetaminophen (TYLENOL) 500 MG tablet Take 500 mg by mouth every 6 (six) hours as needed for mild pain.    Marland Kitchen allopurinol (ZYLOPRIM) 300 MG tablet TAKE 1 TABLET EVERY DAY 90 tablet 3  . allopurinol (ZYLOPRIM) 300 MG tablet Take 1 tablet (300 mg total) by mouth daily. 90 tablet 0  . ASPIR-LOW 81 MG EC tablet Take 1 tablet (81 mg total) by mouth daily. 90 tablet 3  . Blood Glucose Monitoring Suppl (ACCU-CHEK GUIDE) w/Device KIT 1 Units by Does not apply route as directed. 1 kit 0  . Cholecalciferol (VITAMIN D3) 2000 units TABS Take 1 tablet by mouth at bedtime.    . diclofenac Sodium (VOLTAREN) 1 % GEL Apply 2 g topically in the morning and at bedtime. 100 g   . docusate sodium (COLACE) 100 MG capsule Take 100 mg by mouth daily as needed for mild constipation.     Marland Kitchen escitalopram (LEXAPRO) 10 MG tablet TAKE 1 TABLET EVERY DAY 90 tablet 3  . fluticasone (FLONASE) 50 MCG/ACT nasal spray SPRAY 2 SPRAYS INTO EACH NOSTRIL EVERY DAY 48 mL 0  . furosemide (LASIX) 40 MG tablet Take 1 tablet (40 mg total) by mouth daily. 30 tablet 3  . glimepiride (AMARYL) 2 MG tablet TAKE 1 TABLET BY MOUTH DAILY WITH BREAKFAST 90 tablet 0  . glucose blood (ACCU-CHEK GUIDE) test strip Check sugars three times daily and as needed when feeling ill E11.65, insulin use 100 each 11  . glucose blood test strip Use as instructed to check sugars three times daily E11.65, insulin use 100 each 12  . insulin glargine (LANTUS  SOLOSTAR) 100 UNIT/ML Solostar Pen Inject 60 Units into the skin every morning. And pen needles 1/day 60 mL 3  . Insulin Pen Needle (B-D UF III MINI PEN NEEDLES) 31G X 5 MM MISC USE TO INJECT INSULIN DAILY 100 each 3  . loratadine (CLARITIN) 10 MG tablet Take 10 mg by mouth daily.    Marland Kitchen losartan (COZAAR) 50 MG tablet TAKE 1 TABLET EVERY DAY 90 tablet 3  . losartan (COZAAR) 50 MG tablet Take 1 tablet (50 mg total) by mouth daily. 30 tablet 1  . lovastatin (MEVACOR) 20 MG  tablet Take 1 tablet (20 mg total) by mouth at bedtime. 90 tablet 3  . Magnesium 500 MG TABS Take 1 tablet (500 mg total) by mouth every Monday, Wednesday, and Friday. 30 tablet   . metFORMIN (GLUCOPHAGE XR) 500 MG 24 hr tablet Take 1 tablet (500 mg total) by mouth daily with breakfast. 30 tablet 6  . methocarbamol (ROBAXIN) 500 MG tablet Take 1 tablet (500 mg total) by mouth 2 (two) times daily as needed for muscle spasms (sedation precautions). 30 tablet 0  . Multiple Vitamins-Minerals ([facility] SPECTRAVITE PO) Take 1 tablet by mouth at bedtime.    . mycophenolate (MYFORTIC) 360 MG TBEC EC tablet     . Omega-3 Fatty Acids (FISH OIL PO) Take 1 tablet by mouth at bedtime.    Marland Kitchen omeprazole (PRILOSEC) 40 MG capsule TAKE 1 CAPSULE EVERY DAY 90 capsule 3  . tacrolimus (PROGRAF) 1 MG capsule Take 1-2 mg by mouth See admin instructions. Take 2 mg by mouth in the morning, then take 1 mg  by mouth in the evening     No current facility-administered medications on file prior to visit.   Goals    . Patient Stated     Starting 10/15/18, I will continue to take medications as prescribed.     . Patient Stated     01/22/2020, I will maintain and continue medications as prescribed.     . Pharmacy Care Plan     CARE PLAN ENTRY (see longitudinal plan of care for additional care plan information)  Current Barriers:  . Chronic Disease Management support, education, and care coordination needs related to Hypertension and Diabetes   Hypertension BP Readings from Last 3 Encounters:  01/01/20 136/80  12/06/19 (!) 142/86  10/23/19 140/78 .  Pharmacist Clinical Goal(s): o Over the next 4 weeks, patient will work with PharmD and providers to maintain BP goal <140/90 mmHg . Current regimen:  o Losartan 50 mg - 1 tablet daily  . Interventions: o Comprehensive medication review . Patient self care activities - Over the next 4 weeks, patient will: o Check blood pressure 7 days prior to appointments o Ensure daily  salt intake < 2300 mg/day  Diabetes Lab Results  Component Value Date/Time   HGBA1C 10.6 (H) 01/22/2020 07:28 AM   HGBA1C 9.8 (A) 10/23/2019 09:21 AM   HGBA1C 10.1 (H) 07/18/2019 11:33 AM .  Pharmacist Clinical Goal(s): o Over the next 4 weeks, patient will work with PharmD and providers to achieve A1c goal <7% . Current regimen:   Glimepiride 2 mg - 1 tablet daily with breakfast  Lantus - Inject 80 units daily   Metformin 500 mg - 1 tablet daily . Interventions: o Recommend cutting back on bedtime sweets o Continue monitoring BG twice weekly - scheduled CMA call for accountability  . Patient self care activities - Over the next 4 weeks, patient will: o Check blood sugar in  the morning before eating or drinking, document, and provide at future appointments o Contact provider with any episodes of hypoglycemia o Review carbohydrate counting handout  Vaccinations: Remain up to date on vaccinations. Recommend 2 doses of the Shingrix vaccine.   Please see past updates related to this goal by clicking on the "Past Updates" button in the selected goal       Diabetes   CMP Latest Ref Rng & Units 04/07/2020 01/22/2020 08/02/2019  Glucose 70 - 99 mg/dL 358(H) 281(H) 184(H)  BUN 6 - 23 mg/dL $Remove'16 18 16  'gVNybzf$ Creatinine 0.40 - 1.50 mg/dL 1.17 1.30 1.18  Sodium 135 - 145 mEq/L 135 135 138  Potassium 3.5 - 5.1 mEq/L 4.3 4.4 3.8  Chloride 96 - 112 mEq/L 99 101 107  CO2 19 - 32 mEq/L $Remove'27 27 24  'XHgtcgj$ Calcium 8.4 - 10.5 mg/dL 9.3 9.2 8.4(L)  Total Protein 6.0 - 8.3 g/dL - 7.0 -  Total Bilirubin 0.2 - 1.2 mg/dL - 0.6 -  Alkaline Phos 39 - 117 U/L - 79 -  AST 0 - 37 U/L - 24 -  ALT 0 - 53 U/L - 43 -   Recent Relevant Labs: Lab Results  Component Value Date/Time   HGBA1C 11.4 (A) 04/06/2020 09:31 AM   HGBA1C 10.6 (H) 01/22/2020 07:28 AM   HGBA1C 9.8 (A) 10/23/2019 09:21 AM   HGBA1C 10.1 (H) 07/18/2019 11:33 AM   MICROALBUR <0.7 08/27/2014 09:51 AM   A1c goal < 7%  Fasting goal:  80-130 Post-prandial goal: < 180   Increased Lantus from 60 to 80 units qhs on Monday 11/1 (instructed per endo visit 10/27) and resumed checking BG twice daily for past 3 days. Reports checking BG before breakfast, skipping lunch and checking BG again before dinner. Denies any s/s of hypoglycemia.  Before breakfast - 348, 278, 306 (before breakfast at 7:30 AM)  Before supper - 236, 162 (4-5 PM)   Patient has failed these meds in past: Ozempic (nausea/diarrhea) Patient is currently uncontrolled on the following medications:   Glimepiride 2 mg - 1 tablet daily with breakfast  Lantus - Inject 80 units daily  Metformin 500 mg XR- 1 tablet daily (max dose due to intolerance)  We discussed: Pt reports eating a lot of halloween candy nightly before bed and chocolate cake last night. He has tried losing weight without success. Uses artificial sweeteners in coffee and has replaced sweet tea, lemonade, etc. with no sugar varieties. He reports he is very motivated to change diet. Encouraged him to continue to monitor BG and try cutting out bedtime sweets to see if BG lowers with dietary changes. He did not experience any BG lowering with metformin and very minimal change with Lantus dose increase.  Previously sent carbohydrate counting sheet. Encouraged setting SMART goals.   Tried therapies: Previously reported open to trying Ozempic again as he reports it was milder on his stomach than metformin.   Last diabetic eye exam: due for annual exam Last diabetic foot exam: updated 02/06/20 by PCP  Plan: Continue current medications; Encouraged patient to continue monitoring BG twice daily and replace bedtime sweets with healthy snacks. F/u call from [place] in 2 weeks to assess BG log and encourage dietary changes.   Hypertension   CMP Latest Ref Rng & Units 04/07/2020 01/22/2020 08/02/2019  Glucose 70 - 99 mg/dL 358(H) 281(H) 184(H)  BUN 6 - 23 mg/dL $Remove'16 18 16  'BNSSZqY$ Creatinine 0.40 - 1.50 mg/dL 1.17 1.30 1.18   Sodium 135 -  145 mEq/L 135 135 138  Potassium 3.5 - 5.1 mEq/L 4.3 4.4 3.8  Chloride 96 - 112 mEq/L 99 101 107  CO2 19 - 32 mEq/L $Remove'27 27 24  'tkivCzk$ Calcium 8.4 - 10.5 mg/dL 9.3 9.2 8.4(L)  Total Protein 6.0 - 8.3 g/dL - 7.0 -  Total Bilirubin 0.2 - 1.2 mg/dL - 0.6 -  Alkaline Phos 39 - 117 U/L - 79 -  AST 0 - 37 U/L - 24 -  ALT 0 - 53 U/L - 43 -   Office blood pressures are: BP Readings from Last 3 Encounters:  04/22/20 130/82  04/07/20 130/80  04/06/20 140/84   Patient has failed these meds in the past: doxazosin and amlodipine (no problems, d/c during hospitalization 07/2019) Patient checks BP at home: checked BP for 7 days in August after dose reduction to 50 mg and BP within goal  BP goal < 140/90 mmHg Patient is currently controlled on the following medications:   Losartan 50 mg - 1 tablet daily   We discussed: Losartan increased per cardio 12/06/19, pt experienced dizziness, dose was reduced back to 50 mg per PCP 08/202; renal function stable  Plan: Continue current medications  Hyperlipidemia   LDL goal < 100  Lipid Panel     Component Value Date/Time   CHOL 94 01/22/2020 0728   TRIG (H) 01/22/2020 0728    508.0 Triglyceride is over 400; calculations on Lipids are invalid.   HDL 26.20 (L) 01/22/2020 0728   LDLCALC 44 10/06/2017 1017   LDLDIRECT 35.0 01/22/2020 0728     Patient has failed these meds in past: none  Patient is currently on the following medications:  . Lovastatin 20 mg - 1 tablet daily (increased from weekly to daily July 2021 per liver specialist)  We discussed: continues to do well on daily dosing; TG remain elevated likely secondary to elevated BG  Plan: Continue current medications  CCM Follow Up: 2 weeks CMA phone call for BG log, 1 month telephone visit with pharmacist   Medication Management:  < 5 day gap between refills on all maintenance medications   Appears patient may be filling [facility]artan [facility] and 50 mg at [facility] order -  encouraged him to only take 50 mg dose daily  Debbora Dus, PharmD Clinical Pharmacist [facility] [phone]

## 2020-04-30 NOTE — Progress Notes (Signed)
I have collaborated with the care management provider regarding care management and care coordination activities outlined in this encounter and have reviewed this encounter including documentation in the note and care plan. I am certifying that I agree with the content of this note and encounter as supervising physician.  

## 2020-05-20 ENCOUNTER — Other Ambulatory Visit: Payer: Self-pay | Admitting: Family Medicine

## 2020-06-04 ENCOUNTER — Ambulatory Visit: Payer: Medicare HMO

## 2020-06-04 DIAGNOSIS — Z944 Liver transplant status: Secondary | ICD-10-CM | POA: Diagnosis not present

## 2020-06-04 DIAGNOSIS — Z794 Long term (current) use of insulin: Secondary | ICD-10-CM

## 2020-06-04 DIAGNOSIS — Z79899 Other long term (current) drug therapy: Secondary | ICD-10-CM | POA: Diagnosis not present

## 2020-06-04 DIAGNOSIS — Z48298 Encounter for aftercare following other organ transplant: Secondary | ICD-10-CM | POA: Diagnosis not present

## 2020-06-04 DIAGNOSIS — I10 Essential (primary) hypertension: Secondary | ICD-10-CM

## 2020-06-04 NOTE — Chronic Care Management (AMB) (Signed)
Chronic Care Management Pharmacy  Name: Colton Porter  MRN: [id number] DOB: May 29, 1966  Chief Complaint/ HPI  [name],  54 y.o., male presents for their Follow-Up CCM visit with the clinical pharmacist via telephone.  PCP : Ria Bush, MD   Their chronic conditions include: hypertension, heart failure, orthostatic hypotension, allergic rhinitis, type 2 diabetes, dyslipidemia, gout, depression, alcohol cirrhosis/status post liver transplant, portal hypertensive gastropathy, neuropathy, CKD  Last CCM 04/29/20: encouraged dietary changes for DM  Office visits/Consults:  04/22/20: Endocrinology - increased Lantus from 60 to 80 units daily, not checking home BG  04/07/20: PCP visit - established care with endo yesterday, compliant to regimen, evaluation of memory loss assessment, RTC 4 months   04/06/20: Endocrinology - initial consult, increase Lantus to 60 units   02/06/20: PCP visit - refer to endocrinology for DM management, switch metformin to ER, dizziness on losartan 100 mg, decrease to 50 mg; lovastatin increased to daily last month per liver clinic, LDL at goal, TG elevated, consider colestipol if ongoing diarrhea   01/01/20: PCP visit - DM, has not started higher dose metformin yet, unsure if taking glimepiride, on chronic prednisone s/p liver transplant; liver clinic recommended daily statin, chronic gout with possible gout flare today, restart voltaren gel TID, if ineffective may fill prednisone 7 day taper, HFpEF, saw cardio and started lasix 40 mg daily   12/06/19: Cardiology - denies LLE, complaint to CPAP, denies SOB and chat pain; mild LLE on exam, restart Lasix 40 mg daily, HTN uncontrolled, increase losartan to 100 mg daily, obesity, goal of 50 lb weight loss, not on statin due to liver transplant   Current Outpatient Medications on File Prior to Visit  Medication Sig Dispense Refill  . Accu-Chek FastClix Lancets MISC Use three times daily to check sugars  E65.11, insulin use 100 each 12  . acetaminophen (TYLENOL) 500 MG tablet Take 500 mg by mouth every 6 (six) hours as needed for mild pain.    Marland Kitchen allopurinol (ZYLOPRIM) 300 MG tablet Take 1 tablet (300 mg total) by mouth daily. 90 tablet 0  . ASPIR-LOW 81 MG EC tablet Take 1 tablet (81 mg total) by mouth daily. 90 tablet 3  . Blood Glucose Monitoring Suppl (ACCU-CHEK GUIDE) w/Device KIT 1 Units by Does not apply route as directed. 1 kit 0  . Cholecalciferol (VITAMIN D3) 2000 units TABS Take 1 tablet by mouth at bedtime.    . docusate sodium (COLACE) 100 MG capsule Take 100 mg by mouth daily as needed for mild constipation.     Marland Kitchen escitalopram (LEXAPRO) 10 MG tablet TAKE 1 TABLET EVERY DAY 90 tablet 3  . fluticasone (FLONASE) 50 MCG/ACT nasal spray SPRAY 2 SPRAYS INTO EACH NOSTRIL EVERY DAY 48 mL 0  . furosemide (LASIX) 40 MG tablet Take 1 tablet (40 mg total) by mouth daily. 30 tablet 3  . glimepiride (AMARYL) 2 MG tablet TAKE 1 TABLET BY MOUTH DAILY WITH BREAKFAST 90 tablet 2  . glucose blood (ACCU-CHEK GUIDE) test strip Check sugars three times daily and as needed when feeling ill E11.65, insulin use 100 each 11  . glucose blood test strip Use as instructed to check sugars three times daily E11.65, insulin use 100 each 12  . insulin glargine (LANTUS SOLOSTAR) 100 UNIT/ML Solostar Pen Inject 60 Units into the skin every morning. And pen needles 1/day 60 mL 3  . Insulin Pen Needle (B-D UF III MINI PEN NEEDLES) 31G X 5 MM MISC USE TO INJECT  INSULIN DAILY 100 each 3  . loratadine (CLARITIN) 10 MG tablet Take 10 mg by mouth daily.    Marland Kitchen losartan (COZAAR) 50 MG tablet Take 1 tablet (50 mg total) by mouth daily. 30 tablet 1  . lovastatin (MEVACOR) 20 MG tablet Take 1 tablet (20 mg total) by mouth at bedtime. 90 tablet 3  . Magnesium 500 MG TABS Take 1 tablet (500 mg total) by mouth every Monday, Wednesday, and Friday. 30 tablet   . metFORMIN (GLUCOPHAGE XR) 500 MG 24 hr tablet Take 1 tablet (500 mg total)  by mouth daily with breakfast. 30 tablet 6  . Multiple Vitamins-Minerals ([employer] SPECTRAVITE PO) Take 1 tablet by mouth at bedtime.    . mycophenolate (MYFORTIC) 360 MG TBEC EC tablet Take 360 mg by mouth 2 (two) times daily.    . Omega-3 Fatty Acids (FISH OIL PO) Take 1 tablet by mouth at bedtime.    Marland Kitchen omeprazole (PRILOSEC) 40 MG capsule TAKE 1 CAPSULE EVERY DAY 90 capsule 3  . tacrolimus (PROGRAF) 1 MG capsule Take 1-2 mg by mouth See admin instructions. Take 2 mg by mouth in the morning, then take 1 mg  by mouth in the evening     No current facility-administered medications on file prior to visit.   Goals    . Patient Stated     Starting 10/15/18, I will continue to take medications as prescribed.     . Patient Stated     01/22/2020, I will maintain and continue medications as prescribed.     . Pharmacy Care Plan     CARE PLAN ENTRY (see longitudinal plan of care for additional care plan information)  Current Barriers:  . Chronic Disease Management support, education, and care coordination needs related to Hypertension and Diabetes   Hypertension BP Readings from Last 3 Encounters:  06/08/20 132/78  04/22/20 130/82  04/07/20 130/80 .  Pharmacist Clinical Goal(s): o Over the next 4 weeks, patient will work with PharmD and providers to maintain BP goal <140/90 mmHg . Current regimen:  o Losartan 50 mg - 1 tablet daily  . Interventions: o Comprehensive medication review . Patient self care activities - Over the next 4 weeks, patient will: o Check blood pressure 7 days prior to appointments o Ensure daily salt intake < 2300 mg/day  Diabetes Lab Results  Component Value Date/Time   HGBA1C 11.4 (A) 04/06/2020 09:31 AM   HGBA1C 10.6 (H) 01/22/2020 07:28 AM   HGBA1C 9.8 (A) 10/23/2019 09:21 AM   HGBA1C 10.1 (H) 07/18/2019 11:33 AM .  Pharmacist Clinical Goal(s): o Over the next 4 weeks, patient will work with PharmD and providers to achieve A1c goal <7% . Current regimen:    Glimepiride 2 mg - 1 tablet daily with breakfast  Lantus - Inject 80 units daily   Metformin 500 mg - 1 tablet daily . Interventions: o Recommend starting Trulicity 5.57 mg weekly . Patient self care activities - Over the next 4 weeks, patient will: o Check blood sugar in the morning before eating or drinking, document, and provide at future appointments o Contact provider with any episodes of hypoglycemia o Review carbohydrate counting handout  Vaccinations: Remain up to date on vaccinations. Recommend 2 doses of the Shingrix vaccine.   Please see past updates related to this goal by clicking on the "Past Updates" button in the selected goal       Diabetes   CMP Latest Ref Rng & Units 04/07/2020 01/22/2020 08/02/2019  Glucose  70 - 99 mg/dL 358(H) 281(H) 184(H)  BUN 6 - 23 mg/dL _0 Creatinine 0.40 - 1.50 mg/dL 1.17 1.30 1.18  Sodium 135 - 145 mEq/L 135 135 138  Potassium 3.5 - 5.1 mEq/L 4.3 4.4 3.8  Chloride 96 - 112 mEq/L 99 101 107  CO2 19 - 32 mEq/L _1 Calcium 8.4 - 10.5 mg/dL 9.3 9.2 8.4(L)  Total Protein 6.0 - 8.3 g/dL - 7.0 -  Total Bilirubin 0.2 - 1.2 mg/dL - 0.6 -  Alkaline Phos 39 - 117 U/L - 79 -  AST 0 - 37 U/L - 24 -  ALT 0 - 53 U/L - 43 -   Recent Relevant Labs: Lab Results  Component Value Date/Time   HGBA1C 11.4 (A) 04/06/2020 09:31 AM   HGBA1C 10.6 (H) 01/22/2020 07:28 AM   HGBA1C 9.8 (A) 10/23/2019 09:21 AM   HGBA1C 10.1 (H) 07/18/2019 11:33 AM   MICROALBUR <0.7 08/27/2014 09:51 AM   A1c goal < 7%  Fasting goal: 80-130 Post-prandial goal: < 180   Last med change - increased Lantus to 80 units 04/27/20 per endo Patient has failed these meds in past: Ozempic (nausea/diarrhea) Patient is currently uncontrolled on the following medications:   Glimepiride 2 mg - 1 tablet daily with breakfast  Lantus - Inject 80 units daily  Metformin 500 mg XR- 1 tablet daily (max dose due to intolerance)  Last diabetic eye exam: due for annual  exam Last diabetic foot exam: updated 02/06/20 by PCP  Update 06/04/20: Checking fasting BG daily - running 200-250 before breakfast. If he doesn't eat anything during the day he will check it again, lowest 168. Reviewed insulin administration. Pt appears to be administering accurately.  Diet - Reports he plans to cut out sugar all together 06/28/19. Feels defeated because he has tried changing diet with minimal success. Eating a lot of sandwiches at home. Trying not to eat out as much. Plans to switch to unsweet tea. Uses Splenda in coffee. Denies interest in diabetes education class.    Pt reports he didn't give Ozempic much of a chance. Reports cost may have been a concern. He is open to retrial - consider Trulicity due to less potential for GI side effects.   Plan: Continue current medications; Consult PCP to start Trulicity. CMA call to review cost concerns.   Hypertension   CMP Latest Ref Rng & Units 04/07/2020 01/22/2020 08/02/2019  Glucose 70 - 99 mg/dL 358(H) 281(H) 184(H)  BUN 6 - 23 mg/dL _2 Creatinine 0.40 - 1.50 mg/dL 1.17 1.30 1.18  Sodium 135 - 145 mEq/L 135 135 138  Potassium 3.5 - 5.1 mEq/L 4.3 4.4 3.8  Chloride 96 - 112 mEq/L 99 101 107  CO2 19 - 32 mEq/L _3 Calcium 8.4 - 10.5 mg/dL 9.3 9.2 8.4(L)  Total Protein 6.0 - 8.3 g/dL - 7.0 -  Total Bilirubin 0.2 - 1.2 mg/dL - 0.6 -  Alkaline Phos 39 - 117 U/L - 79 -  AST 0 - 37 U/L - 24 -  ALT 0 - 53 U/L - 43 -   Office blood pressures are: BP Readings from Last 3 Encounters:  06/08/20 132/78  04/22/20 130/82  04/07/20 130/80   Patient has failed these meds in the past: doxazosin and amlodipine (no problems, d/c during hospitalization 07/2019) Patient checks BP at home: checked BP for 7 days in August after dose reduction to 50 mg and BP within  goal  BP goal < 140/90 mmHg Patient is currently controlled on the following medications:   Losartan 50 mg - 1 tablet daily   We discussed: Losartan increased per  cardio 12/06/19, pt experienced dizziness, dose was reduced back to 50 mg per PCP 01/2020; renal function stable  Update 06/04/20: Pt reports he sees cardiology Monday. Not checking home BP but clinic readings are within goal < 140/90 mmHg  Plan: Continue current medications  Hyperlipidemia   LDL goal < 100  Lipid Panel     Component Value Date/Time   CHOL 94 01/22/2020 0728   TRIG (H) 01/22/2020 0728    508.0 Triglyceride is over 400; calculations on Lipids are invalid.   HDL 26.20 (L) 01/22/2020 0728   LDLCALC 44 10/06/2017 1017   LDLDIRECT 35.0 01/22/2020 0728     Patient has failed these meds in past: none  Patient is currently on the following medications:  . Lovastatin 20 mg - 1 tablet daily (increased from weekly to daily July 2021 per liver specialist)  We discussed: continues to do well on daily dosing; TG remain elevated likely secondary to elevated BG  Plan: Continue current medications; Improve BG control.  CCM Follow Up: 3 months, telephone (endo follow up 1/27, PCP follow up 2/11)  Debbora Dus, PharmD Clinical Pharmacist [facility] [phone]

## 2020-06-05 ENCOUNTER — Other Ambulatory Visit: Payer: Self-pay

## 2020-06-08 ENCOUNTER — Ambulatory Visit: Payer: Medicare HMO | Admitting: Cardiology

## 2020-06-08 ENCOUNTER — Encounter: Payer: Self-pay | Admitting: Cardiology

## 2020-06-08 ENCOUNTER — Other Ambulatory Visit: Payer: Self-pay

## 2020-06-08 VITALS — BP 132/78 | HR 88 | Ht 66.5 in | Wt 266.8 lb

## 2020-06-08 DIAGNOSIS — Z794 Long term (current) use of insulin: Secondary | ICD-10-CM | POA: Diagnosis not present

## 2020-06-08 DIAGNOSIS — Z944 Liver transplant status: Secondary | ICD-10-CM

## 2020-06-08 DIAGNOSIS — E782 Mixed hyperlipidemia: Secondary | ICD-10-CM | POA: Diagnosis not present

## 2020-06-08 DIAGNOSIS — I5033 Acute on chronic diastolic (congestive) heart failure: Secondary | ICD-10-CM

## 2020-06-08 DIAGNOSIS — I10 Essential (primary) hypertension: Secondary | ICD-10-CM

## 2020-06-08 DIAGNOSIS — E785 Hyperlipidemia, unspecified: Secondary | ICD-10-CM | POA: Diagnosis not present

## 2020-06-08 DIAGNOSIS — E1169 Type 2 diabetes mellitus with other specified complication: Secondary | ICD-10-CM | POA: Diagnosis not present

## 2020-06-08 MED ORDER — EMPAGLIFLOZIN 10 MG PO TABS: 10.0000 mg | ORAL_TABLET | Freq: Every day | ORAL | 1 refills | Status: DC

## 2020-06-08 NOTE — Progress Notes (Signed)
Cardiology Office Note:    Date:  06/08/2020   ID:  Colton Porter, DOB 1966-03-28, MRN [id number]  PCP:  Ria Bush, MD  [facility] Cardiologist:  No primary care provider on file.  [facility] Electrophysiologist:  None   Referring MD: Ria Bush, MD   Chief complaint: Follow-up for CHF, lower extremity edema  History of Present Illness:    Colton Porter is a 54 y.o. male with a hx of hyperlipidemia,obesity, OSA on CPAP since 06/2013, hypertension, chronic diastolic dysfunction,chronic thrombocytopenia with platelet around 73, significant drinking history with subsequent alcoholic cirrhosis and liver transplant in July 2017 and is being followed by [facility] liver transplant team .He has family history of early CAD with his brother having first MI at age 25. He had left and right heart catheterization on 06/10/2013 which showed normal coronaries, elevated right heart pressure likely related to diastolic dysfunction. He was evaluated 6 months ago for dyspnea.  2D echo 05/2016 showed normal LVEF at 55-60% with grade 2DD. No wall motion abnormalities.  Test in 2018 showed normal LVEF and no ischemia or prior infarct.  12/06/2019 - the patient is coming after 6 months, he states that he had Covid infection in January of this year did require 12 days of hospitalization but no intubation.  He had an echocardiogram done at that time that showed hyperdynamic LVEF 65 to 70%.  Moderate concentric LVH, grade 1 diastolic dysfunction no significant valvular abnormality.  At the time he has lost 25 pounds but has gained 10 since then.  While he was in the hospital he was hypotensive, his Lasix was discontinued.  He has no needs lower extremity edema since then, he controls it with compression socks.  He is compliant with his CPAP machine.  He denies any significant exertional shortness of breath and has no chest pain.  No orthopnea or proximal nocturnal dyspnea.  06/08/2020 -the patient  is coming after 6 months, he is doing overall well, he is compliant with his medications except for fish oil.  He wears compression socks all the time, denies any orthopnea proximal nocturnal dyspnea.  Sleeps with a CPAP machine.  Denies any chest pain.  No palpitations or falls.  He has started to see endocrinologist and his dose of Lantus has been increased.  Past Medical History:  Diagnosis Date  . Alcohol dependence ([place]) 07/11/2012  . Alcoholic cirrhosis of liver with ascites ([place]) 07/2014   s./p transplant 12/2015  . Allergy   . Anemia   . Cellulitis of left leg   . Chronic diastolic heart failure ([place]) 11/01/2013  . CKD (chronic kidney disease) stage 3, GFR 30-59 ml/min ([facility]) 11/04/2015  . Diabetes mellitus without complication ([place])   . GERD (gastroesophageal reflux disease)   . Hyperlipidemia   . Hypertension   . Neuropathy   . OSA on CPAP 07/11/2012   HST 07/2013:  AHI 39/hr.    . Pneumonia due to COVID-19 virus 06/2019  . Thrombocytopenia ([place]) 12/15/2011    Past Surgical History:  Procedure Laterality Date  . COLONOSCOPY  08/2013   hyperplastic polyps, hemorrhoids Ardis Hughs)  . 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 FINGER AMPUTATION  1997   left 4th finger - radial arm saw  . LEFT AND RIGHT HEART CATHETERIZATION WITH CORONARY ANGIOGRAM N/A 06/10/2013   Procedure: LEFT AND RIGHT HEART CATHETERIZATION WITH CORONARY ANGIOGRAM;  Surgeon: Blane Ohara, MD;  Location: [facility] CATH LAB;  Service: Cardiovascular;  Laterality: N/A;  . LIVER TRANSPLANTATION  16/1096   alcoholic cirrhosis ([facility] at [facility])    Current Medications: Current Meds  Medication Sig  . Accu-Chek FastClix Lancets MISC Use three times daily to check sugars E65.11, insulin use  . acetaminophen (TYLENOL) 500 MG tablet Take 500 mg by mouth every 6 (six) hours as needed for mild pain.  Marland Kitchen allopurinol (ZYLOPRIM) 300 MG tablet Take 1 tablet (300 mg total) by mouth daily.   . ASPIR-LOW 81 MG EC tablet Take 1 tablet (81 mg total) by mouth daily.  . Blood Glucose Monitoring Suppl (ACCU-CHEK GUIDE) w/Device KIT 1 Units by Does not apply route as directed.  . Cholecalciferol (VITAMIN D3) 2000 units TABS Take 1 tablet by mouth at bedtime.  . docusate sodium (COLACE) 100 MG capsule Take 100 mg by mouth daily as needed for mild constipation.   Marland Kitchen escitalopram (LEXAPRO) 10 MG tablet TAKE 1 TABLET EVERY DAY  . fluticasone (FLONASE) 50 MCG/ACT nasal spray SPRAY 2 SPRAYS INTO EACH NOSTRIL EVERY DAY  . furosemide (LASIX) 40 MG tablet Take 1 tablet (40 mg total) by mouth daily.  Marland Kitchen glimepiride (AMARYL) 2 MG tablet TAKE 1 TABLET BY MOUTH DAILY WITH BREAKFAST  . glucose blood (ACCU-CHEK GUIDE) test strip Check sugars three times daily and as needed when feeling ill E11.65, insulin use  . glucose blood test strip Use as instructed to check sugars three times daily E11.65, insulin use  . insulin glargine (LANTUS SOLOSTAR) 100 UNIT/ML Solostar Pen Inject 60 Units into the skin every morning. And pen needles 1/day  . Insulin Pen Needle (B-D UF III MINI PEN NEEDLES) 31G X 5 MM MISC USE TO INJECT INSULIN DAILY  . loratadine (CLARITIN) 10 MG tablet Take 10 mg by mouth daily.  Marland Kitchen losartan (COZAAR) 50 MG tablet Take 1 tablet (50 mg total) by mouth daily.  Marland Kitchen lovastatin (MEVACOR) 20 MG tablet Take 1 tablet (20 mg total) by mouth at bedtime.  . Magnesium 500 MG TABS Take 1 tablet (500 mg total) by mouth every Monday, Wednesday, and Friday.  . metFORMIN (GLUCOPHAGE XR) 500 MG 24 hr tablet Take 1 tablet (500 mg total) by mouth daily with breakfast.  . Multiple Vitamins-Minerals (CVS SPECTRAVITE PO) Take 1 tablet by mouth at bedtime.  . mycophenolate (MYFORTIC) 360 MG TBEC EC tablet Take 360 mg by mouth 2 (two) times daily.  . Omega-3 Fatty Acids (FISH OIL PO) Take 1 tablet by mouth at bedtime.  Marland Kitchen omeprazole (PRILOSEC) 40 MG capsule TAKE 1 CAPSULE EVERY DAY  . tacrolimus (PROGRAF) 1 MG capsule  Take 1-2 mg by mouth See admin instructions. Take 2 mg by mouth in the morning, then take 1 mg  by mouth in the evening     Allergies:   Tolmetin, Ambien [zolpidem tartrate], Morphine and related, Hydrochlorothiazide w-triamterene, and Lisinopril   Social History   Socioeconomic History  . Marital status: Married    Spouse name: Not on file  . Number of children: 2  . Years of education: Not on file  . Highest education level: Not on file  Occupational History  . Occupation: Investment banker, corporate    Comment: no work lately  Tobacco Use  . Smoking status: Never Smoker  . Smokeless tobacco: Former Systems developer    Types: Secondary school teacher  . Vaping Use: Never used  Substance and Sexual Activity  . Alcohol use: No    Alcohol/week: 0.0 standard drinks    Comment: 4-6 drinks daily -  NO ETOH SINCE APRIL 2016  . Drug use: Not Currently    Comment: remote use of marijuana in the past, has since quit  . Sexual activity: Not on file  Other Topics Concern  . Not on file  Social History Narrative    Lives with wife   Occupation: full disability after cirrhosis dx, some working    Activity: some yardwork   Diet: good water, good fruit intake, lots of red meats   Social Determinants of Health   Financial Resource Strain: Low Risk   . Difficulty of Paying Living Expenses: Not hard at all  Food Insecurity: No Food Insecurity  . Worried About Charity fundraiser in the Last Year: Never true  . Ran Out of Food in the Last Year: Never true  Transportation Needs: No Transportation Needs  . Lack of Transportation (Medical): No  . Lack of Transportation (Non-Medical): No  Physical Activity: Inactive  . Days of Exercise per Week: 0 days  . Minutes of Exercise per Session: 0 min  Stress: No Stress Concern Present  . Feeling of Stress : Not at all  Social Connections: Not on file     Family History: The patient's family history includes Emphysema in his father and mother; Heart disease in his  father; Hypertension in his father; Lung cancer in his maternal aunt; Stroke in his mother and paternal grandmother. There is no history of Heart attack or Diabetes.  ROS:   Please see the history of present illness.    All other systems reviewed and are negative.  EKGs/Labs/Other Studies Reviewed:    The following studies were reviewed today:  EKG:  EKG is ordered today.  The ekg ordered today demonstrates SR, normal EKG, unchanged from prior.  This was personally reviewed.  Recent Labs: 07/19/2019: B Natriuretic Peptide 181.0 01/22/2020: ALT 43; Hemoglobin 12.8; Platelets 133.0 02/17/2020: Magnesium 1.3 04/07/2020: BUN 16; Creatinine, Ser 1.17; Potassium 4.3; Sodium 135; TSH 1.10  Recent Lipid Panel    Component Value Date/Time   CHOL 94 01/22/2020 0728   TRIG (H) 01/22/2020 0728    508.0 Triglyceride is over 400; calculations on Lipids are invalid.   HDL 26.20 (L) 01/22/2020 0728   CHOLHDL 4 01/22/2020 0728   VLDL 59.6 (H) 10/15/2018 0929   LDLCALC 44 10/06/2017 1017   LDLDIRECT 35.0 01/22/2020 0728   Physical Exam:    VS:  BP 132/78   Pulse 88   Ht 5' 6.5" (1.689 m)   Wt 266 lb 12.8 oz (121 kg)   SpO2 95%   BMI 42.42 kg/m     Wt Readings from Last 3 Encounters:  06/08/20 266 lb 12.8 oz (121 kg)  04/22/20 266 lb (120.7 kg)  04/07/20 266 lb 2 oz (120.7 kg)    GEN: Obese, well developed in no acute distress HEENT: Normal NECK: No JVD; No carotid bruits LYMPHATICS: No lymphadenopathy CARDIAC: RRR, no murmurs, rubs, gallops RESPIRATORY:  Clear to auscultation without rales, wheezing or rhonchi  ABDOMEN: Soft, non-tender, non-distended MUSCULOSKELETAL:  No edema; No deformity  SKIN: Warm and dry NEUROLOGIC:  Alert and oriented x 3 PSYCHIATRIC:  Normal affect    ASSESSMENT:    1. Acute on chronic diastolic heart failure ([place])   2. Type 2 diabetes mellitus with other specified complication, with long-term current use of insulin ([place])   3. Dyslipidemia associated  with type 2 diabetes mellitus ([place])   4. H/O liver transplant ([place])   5. Morbid obesity ([place])  6. Hypertension, unspecified type   7. Mixed hyperlipidemia    PLAN:    In order of problems listed above:  1. Acute  on chronic Diastolic HF: Well-controlled with Lasix 40 mg daily and compression socks, I will add Jardiance 10 mg to his regimen.  2. HTN: Controlled, he could not tolerate higher dose of losartan 100 mg daily as he developed orthostatic hypotension.  3. H/o Liver Transplant: followed by specialist in [place].   4. OSA: compliant with CPAP nightly. Followed by pulmonology.   5.  Diabetes mellitus insulin-dependent: He is on Lantus, glimepiride and Metformin, I am adding Jardiance 10 mg daily to his regimen.  6. Obesity: pt is motivated to lose weight his goal is to 50 pounds.  7. DLD: On low-dose lovastatin, he is instructed to start taking fish oil, better diabetes control should also help with his triglycerides.  His LFTs have been normal.  Medication Adjustments/Labs and Tests Ordered: Current medicines are reviewed at length with the patient today.  Concerns regarding medicines are outlined above.  Orders Placed This Encounter  Procedures  . EKG 12-Lead   Meds ordered this encounter  Medications  . empagliflozin (JARDIANCE) 10 MG TABS tablet    Sig: Take 1 tablet (10 mg total) by mouth daily before breakfast.    Dispense:  90 tablet    Refill:  1    Patient Instructions  Medication Instructions:   START TAKING JARDIANCE 10 MG BY MOUTH DAILY BEFORE BREAKFAST  *If you need a refill on your cardiac medications before your next appointment, please call your pharmacy*   Follow-Up: At [facility], you and your health needs are our priority.  As part of our continuing mission to provide you with exceptional heart care, we have created designated Provider Care Teams.  These Care Teams include your primary Cardiologist (physician) and Advanced Practice  Providers (APPs -  Physician Assistants and Nurse Practitioners) who all work together to provide you with the care you need, when you need it.  We recommend signing up for the patient portal called "MyChart".  Sign up information is provided on this After Visit Summary.  MyChart is used to connect with patients for Virtual Visits (Telemedicine).  Patients are able to view lab/test results, encounter notes, upcoming appointments, etc.  Non-urgent messages can be sent to your provider as well.   To learn more about what you can do with MyChart, go to [email or web address].    Your next appointment:   6 month(s)  The format for your next appointment:   In Person  Provider:   Ena Dawley, MD        Signed, Ena Dawley, MD  06/08/2020 8:48 AM    [place]

## 2020-06-08 NOTE — Patient Instructions (Signed)
Medication Instructions:   START TAKING JARDIANCE 10 MG BY MOUTH DAILY BEFORE BREAKFAST  *If you need a refill on your cardiac medications before your next appointment, please call your pharmacy*   Follow-Up: At [facility], you and your health needs are our priority.  As part of our continuing mission to provide you with exceptional heart care, we have created designated Provider Care Teams.  These Care Teams include your primary Cardiologist (physician) and Advanced Practice Providers (APPs -  Physician Assistants and Nurse Practitioners) who all work together to provide you with the care you need, when you need it.  We recommend signing up for the patient portal called "MyChart".  Sign up information is provided on this After Visit Summary.  MyChart is used to connect with patients for Virtual Visits (Telemedicine).  Patients are able to view lab/test results, encounter notes, upcoming appointments, etc.  Non-urgent messages can be sent to your provider as well.   To learn more about what you can do with MyChart, go to [email or web address].    Your next appointment:   6 month(s)  The format for your next appointment:   In Person  Provider:   Ena Dawley, MD

## 2020-06-15 ENCOUNTER — Telehealth: Payer: Self-pay

## 2020-06-15 MED ORDER — TRULICITY 0.75 MG/0.5ML ~~LOC~~ SOAJ: 0.7500 mg | SUBCUTANEOUS | 6 refills | Status: DC

## 2020-06-15 NOTE — Telephone Encounter (Signed)
PCP consult regarding CCM visit 06/04/20:  Pt reports fasting BG still in 200-250 range despite Lantus increase from 60 to 80 units. He is not sure he gave Ozempic a true fair trial. He is interested in trying again. Due to history of upset stomach, we could try Trulicity which may cause less. He would like to check price - if you approve please send Trulicity 6.82 mg to his current pharmacy. No contraindications to Trulicity noted. Of note, patient was also started on Jardiance 10 mg per cardiology 06/08/20. Next endocrinology appt 07/23/20.  Thanks!   Debbora Dus, PharmD Clinical Pharmacist [facility] [phone]

## 2020-06-15 NOTE — Patient Instructions (Signed)
Dear Colton Porter,  Below is a summary of the goals we discussed during our follow up appointment on June 15, 2020. Please contact me anytime with questions or concerns.   Visit Information  Goals Addressed            This Visit's Progress   . Pharmacy Care Plan       CARE PLAN ENTRY (see longitudinal plan of care for additional care plan information)  Current Barriers:  . Chronic Disease Management support, education, and care coordination needs related to Hypertension and Diabetes   Hypertension BP Readings from Last 3 Encounters:  06/08/20 132/78  04/22/20 130/82  04/07/20 130/80 .  Pharmacist Clinical Goal(s): o Over the next 4 weeks, patient will work with PharmD and providers to maintain BP goal <140/90 mmHg . Current regimen:  o Losartan 50 mg - 1 tablet daily  . Interventions: o Comprehensive medication review . Patient self care activities - Over the next 4 weeks, patient will: o Check blood pressure 7 days prior to appointments o Ensure daily salt intake < 2300 mg/day  Diabetes Lab Results  Component Value Date/Time   HGBA1C 11.4 (A) 04/06/2020 09:31 AM   HGBA1C 10.6 (H) 01/22/2020 07:28 AM   HGBA1C 9.8 (A) 10/23/2019 09:21 AM   HGBA1C 10.1 (H) 07/18/2019 11:33 AM .  Pharmacist Clinical Goal(s): o Over the next 4 weeks, patient will work with PharmD and providers to achieve A1c goal <7% . Current regimen:   Glimepiride 2 mg - 1 tablet daily with breakfast  Lantus - Inject 80 units daily   Metformin 500 mg - 1 tablet daily . Interventions: o Recommend starting Trulicity 0.34 mg weekly . Patient self care activities - Over the next 4 weeks, patient will: o Check blood sugar in the morning before eating or drinking, document, and provide at future appointments o Contact provider with any episodes of hypoglycemia o Review carbohydrate counting handout  Vaccinations: Remain up to date on vaccinations. Recommend 2 doses of the Shingrix vaccine.    Please see past updates related to this goal by clicking on the "Past Updates" button in the selected goal        The patient verbalized understanding of instructions, educational materials, and care plan provided today and declined offer to receive copy of patient instructions, educational materials, and care plan.   The pharmacy team will reach out to the patient again over the next 30 days.   Debbora Dus, [facility]    Diabetes Mellitus and Standards of Medical Care Managing diabetes (diabetes mellitus) can be complicated. Your diabetes treatment may be managed by a team of health care providers, including:  A physician who specializes in diabetes (endocrinologist).  A nurse practitioner or physician assistant.  Nurses.  A diet and nutrition specialist (registered dietitian).  A certified diabetes educator (CDE).  An exercise specialist.  A pharmacist.  An eye doctor.  A foot specialist (podiatrist).  A dentist.  A primary care provider.  A mental health provider. Your health care providers follow guidelines to help you get the best quality of care. The following schedule is a general guideline for your diabetes management plan. Your health care providers may give you more specific instructions. Physical exams Upon being diagnosed with diabetes mellitus, and each year after that, your health care provider will ask about your medical and family history. He or she will also do a physical exam. Your exam may include:  Measuring your height, weight, and body mass index (  BMI).  Checking your blood pressure. This will be done at every routine medical visit. Your target blood pressure may vary depending on your medical conditions, your age, and other factors.  Thyroid gland exam.  Skin exam.  Screening for damage to your nerves (peripheral neuropathy). This may include checking the pulse in your legs and feet and checking the level of sensation in your hands and  feet.  A complete foot exam to inspect the structure and skin of your feet, including checking for cuts, bruises, redness, blisters, sores, or other problems.  Screening for blood vessel (vascular) problems, which may include checking the pulse in your legs and feet and checking your temperature. Blood tests Depending on your treatment plan and your personal needs, you may have the following tests done:  HbA1c (hemoglobin A1c). This test provides information about blood sugar (glucose) control over the previous 2-3 months. It is used to adjust your treatment plan, if needed. This test will be done: ? At least 2 times a year, if you are meeting your treatment goals. ? 4 times a year, if you are not meeting your treatment goals or if treatment goals have changed.  Lipid testing, including total, LDL, and HDL cholesterol and triglyceride levels. ? The goal for LDL is less than 100 mg/dL (5.5 mmol/L). If you are at high risk for complications, the goal is less than 70 mg/dL (3.9 mmol/L). ? The goal for HDL is 40 mg/dL (2.2 mmol/L) or higher for men and 50 mg/dL (2.8 mmol/L) or higher for women. An HDL cholesterol of 60 mg/dL (3.3 mmol/L) or higher gives some protection against heart disease. ? The goal for triglycerides is less than 150 mg/dL (8.3 mmol/L).  Liver function tests.  Kidney function tests.  Thyroid function tests. Dental and eye exams  Visit your dentist two times a year.  If you have type 1 diabetes, your health care provider may recommend an eye exam 3-5 years after you are diagnosed, and then once a year after your first exam. ? For children with type 1 diabetes, a health care provider may recommend an eye exam when your child is age 1 or older and has had diabetes for 3-5 years. After the first exam, your child should get an eye exam once a year.  If you have type 2 diabetes, your health care provider may recommend an eye exam as soon as you are diagnosed, and then once a  year after your first exam. Immunizations   The yearly flu (influenza) vaccine is recommended for everyone 6 months or older who has diabetes.  The pneumonia (pneumococcal) vaccine is recommended for everyone 2 years or older who has diabetes. If you are 90 or older, you may get the pneumonia vaccine as a series of two separate shots.  The hepatitis B vaccine is recommended for adults shortly after being diagnosed with diabetes.  Adults and children with diabetes should receive all other vaccines according to age-specific recommendations from the Centers for Disease Control and Prevention (CDC). Mental and emotional health Screening for symptoms of eating disorders, anxiety, and depression is recommended at the time of diagnosis and afterward as needed. If your screening shows that you have symptoms (positive screening result), you may need more evaluation and you may work with a mental health care provider. Treatment plan Your treatment plan will be reviewed at every medical visit. You and your health care provider will discuss:  How you are taking your medicines, including insulin.  Any side  effects you are experiencing.  Your blood glucose target goals.  The frequency of your blood glucose monitoring.  Lifestyle habits, such as activity level as well as tobacco, alcohol, and substance use. Diabetes self-management education Your health care provider will assess how well you are monitoring your blood glucose levels and whether you are taking your insulin correctly. He or she may refer you to:  A certified diabetes educator to manage your diabetes throughout your life, starting at diagnosis.  A registered dietitian who can create or review your personal nutrition plan.  An exercise specialist who can discuss your activity level and exercise plan. Summary  Managing diabetes (diabetes mellitus) can be complicated. Your diabetes treatment may be managed by a team of health care  providers.  Your health care providers follow guidelines in order to help you get the best quality of care.  Standards of care including having regular physical exams, blood tests, blood pressure monitoring, immunizations, screening tests, and education about how to manage your diabetes.  Your health care providers may also give you more specific instructions based on your individual health. This information is not intended to replace advice given to you by your health care provider. Make sure you discuss any questions you have with your health care provider. Document Revised: 03/02/2018 Document Reviewed: 03/11/2016 Elsevier Patient Education  [place].

## 2020-06-15 NOTE — Telephone Encounter (Signed)
Will have my CMA call to inform patient and schedule 2 week follow up assessment of tolerance.

## 2020-06-15 NOTE — Progress Notes (Signed)
I have collaborated with the care management provider regarding care management and care coordination activities outlined in this encounter and have reviewed this encounter including documentation in the note and care plan. I am certifying that I agree with the content of this note and encounter as supervising physician.  

## 2020-06-15 NOTE — Chronic Care Management (AMB) (Signed)
Chronic Care Management Pharmacy Assistant   Name: Colton Porter  MRN: [id number] DOB: 11-Mar-1966   [facility] 06/15/20  Colton Porter was contacted to verify he has started Jardiance 10 mg (started by cardiology 06/08/20). Patient states he has not started this and has not picked it up from the pharmacy. He read the side effects and does not feel comfortable taking. Patient was also notified the trulicity was approved and was sent to his pharmacy. Its a once weekly injection for diabetes, can help with weight loss, reduce appetite, and is less likely to cause nausea than Ozempic. He then informed me that he has an appointment 06/29/2020 with his "liver doctor" and is not starting anything until after he sees her. I told him I would call him back on 06/30/2020 to follow up.     PCP : Colton Bush, MD  Allergies:   Allergies  Allergen Reactions  . Tolmetin Rash    cirrhosis  . Ambien [Zolpidem Tartrate] Other (See Comments)    Over-toxicity from liver failure  . Morphine And Related     Hallucinations.  . Hydrochlorothiazide W-Triamterene Other (See Comments)    REACTION: dizzy, nausea  . Lisinopril Other (See Comments)    REACTION: cough, decreased libido    Medications: Outpatient Encounter Medications as of 06/15/2020  Medication Sig  . Accu-Chek FastClix Lancets MISC Use three times daily to check sugars E65.11, insulin use  . acetaminophen (TYLENOL) 500 MG tablet Take 500 mg by mouth every 6 (six) hours as needed for mild pain.  Marland Kitchen allopurinol (ZYLOPRIM) 300 MG tablet Take 1 tablet (300 mg total) by mouth daily.  . ASPIR-LOW 81 MG EC tablet Take 1 tablet (81 mg total) by mouth daily.  . Blood Glucose Monitoring Suppl (ACCU-CHEK GUIDE) w/Device KIT 1 Units by Does not apply route as directed.  . Cholecalciferol (VITAMIN D3) 2000 units TABS Take 1 tablet by mouth at bedtime.  . docusate sodium (COLACE) 100 MG capsule Take 100 mg by mouth daily as needed for mild constipation.    . Dulaglutide (TRULICITY) 1.96 QI/2.9NL SOPN Inject 0.75 mg into the skin once a week.  . empagliflozin (JARDIANCE) 10 MG TABS tablet Take 1 tablet (10 mg total) by mouth daily before breakfast.  . escitalopram (LEXAPRO) 10 MG tablet TAKE 1 TABLET EVERY DAY  . fluticasone (FLONASE) 50 MCG/ACT nasal spray SPRAY 2 SPRAYS INTO EACH NOSTRIL EVERY DAY  . furosemide (LASIX) 40 MG tablet Take 1 tablet (40 mg total) by mouth daily.  Marland Kitchen glimepiride (AMARYL) 2 MG tablet TAKE 1 TABLET BY MOUTH DAILY WITH BREAKFAST  . glucose blood (ACCU-CHEK GUIDE) test strip Check sugars three times daily and as needed when feeling ill E11.65, insulin use  . glucose blood test strip Use as instructed to check sugars three times daily E11.65, insulin use  . insulin glargine (LANTUS SOLOSTAR) 100 UNIT/ML Solostar Pen Inject 60 Units into the skin every morning. And pen needles 1/day  . Insulin Pen Needle (B-D UF III MINI PEN NEEDLES) 31G X 5 MM MISC USE TO INJECT INSULIN DAILY  . loratadine (CLARITIN) 10 MG tablet Take 10 mg by mouth daily.  Marland Kitchen losartan (COZAAR) 50 MG tablet Take 1 tablet (50 mg total) by mouth daily.  Marland Kitchen lovastatin (MEVACOR) 20 MG tablet Take 1 tablet (20 mg total) by mouth at bedtime.  . Magnesium 500 MG TABS Take 1 tablet (500 mg total) by mouth every Monday, Wednesday, and Friday.  . metFORMIN (GLUCOPHAGE XR)  500 MG 24 hr tablet Take 1 tablet (500 mg total) by mouth daily with breakfast.  . Multiple Vitamins-Minerals ([employer] SPECTRAVITE PO) Take 1 tablet by mouth at bedtime.  . mycophenolate (MYFORTIC) 360 MG TBEC EC tablet Take 360 mg by mouth 2 (two) times daily.  . Omega-3 Fatty Acids (FISH OIL PO) Take 1 tablet by mouth at bedtime.  Marland Kitchen omeprazole (PRILOSEC) 40 MG capsule TAKE 1 CAPSULE EVERY DAY  . tacrolimus (PROGRAF) 1 MG capsule Take 1-2 mg by mouth See admin instructions. Take 2 mg by mouth in the morning, then take 1 mg  by mouth in the evening   No facility-administered encounter medications on  file as of 06/15/2020.    Current Diagnosis: Patient Active Problem List   Diagnosis Date Noted  . Chronic right-sided thoracic back pain 04/07/2020  . Memory loss 04/07/2020  . Perianal pruritus 02/06/2020  . Elevated PSA 02/06/2020  . Advanced directives, counseling/discussion 02/06/2020  . Acute pain of left foot 01/01/2020  . Pain, dental 08/21/2019  . Orthostatic hypotension 07/31/2019  . Syncope 07/30/2019  . Left rib fracture 07/27/2019  . Acute respiratory failure with hypoxia ([place]) 07/18/2019  . Pneumonia due to COVID-19 virus 07/18/2019  . Earlobe lesion, left 02/05/2018  . Chronic heel pain, left 10/14/2017  . Anejaculation 08/09/2017  . Chronic gout 05/22/2017  . Dyspnea 11/18/2016  . BPH (benign prostatic hyperplasia) 10/05/2016  . Status post liver transplant ([place]) 03/15/2016  . Portal hypertensive gastropathy ([place]) 12/04/2015  . Type 2 diabetes mellitus with other specified complication ([place]) 73/22/0254  . Gynecomastia   . CKD (chronic kidney disease) stage 3, GFR 30-59 ml/min (HCC) 11/04/2015  . Depression, major, single episode, moderate ([place]) 07/09/2015  . Headache 02/19/2015  . Insomnia 12/06/2014  . Hypomagnesemia   . Other fatigue 11/18/2014  . Internal hemorrhoid 10/01/2014  . Erectile dysfunction 10/01/2014  . Alcoholic cirrhosis of liver without ascites ([place]) 07/28/2014  . (HFpEF) heart failure with preserved ejection fraction ([place]) 11/01/2013  . Essential hypertension 06/04/2013  . OSA on CPAP 07/11/2012  . History of alcohol dependence ([place]) 07/11/2012  . Thrombocytopenia ([place]) 12/15/2011  . Morbid obesity with BMI of 40.0-44.9, adult ([place]) 11/16/2011  . Routine general medical examination at a health care facility 09/22/2010  . NEUROPATHY 05/27/2009  . Pedal edema 09/22/2008  . Dyslipidemia associated with type 2 diabetes mellitus ([place]) 02/15/2007  . Allergic rhinitis 02/15/2007  . GERD 02/15/2007      Follow-Up:  Pharmacist Review    Colton Porter, CPP notified  Colton Porter, [place] Pharmacy Assistant [phone]

## 2020-06-15 NOTE — Telephone Encounter (Signed)
Trulicity 0.75mg  sent to local pharmacy.

## 2020-06-25 ENCOUNTER — Telehealth: Payer: Self-pay

## 2020-06-25 NOTE — Chronic Care Management (AMB) (Addendum)
Chronic Care Management Pharmacy Assistant   Name: Colton Porter  MRN: [id number] DOB: May 13, 1966  Reason for Encounter: Medication Review- adherence review  Reviewed chart and adherence measures. Per [employer], medication adherence for Cholesterol 100% med compliance. Medication adherence for Hypertension 90-99% med compliance.   PCP : Ria Bush, MD  Allergies:   Allergies  Allergen Reactions   Tolmetin Rash    cirrhosis   Ambien [Zolpidem Tartrate] Other (See Comments)    Over-toxicity from liver failure   Morphine And Related     Hallucinations.   Hydrochlorothiazide W-Triamterene Other (See Comments)    REACTION: dizzy, nausea   Lisinopril Other (See Comments)    REACTION: cough, decreased libido    Medications: Outpatient Encounter Medications as of 06/25/2020  Medication Sig   Accu-Chek FastClix Lancets MISC Use three times daily to check sugars E65.11, insulin use   acetaminophen (TYLENOL) 500 MG tablet Take 500 mg by mouth every 6 (six) hours as needed for mild pain.   allopurinol (ZYLOPRIM) 300 MG tablet Take 1 tablet (300 mg total) by mouth daily.   ASPIR-LOW 81 MG EC tablet Take 1 tablet (81 mg total) by mouth daily.   Blood Glucose Monitoring Suppl (ACCU-CHEK GUIDE) w/Device KIT 1 Units by Does not apply route as directed.   Cholecalciferol (VITAMIN D3) 2000 units TABS Take 1 tablet by mouth at bedtime.   docusate sodium (COLACE) 100 MG capsule Take 100 mg by mouth daily as needed for mild constipation.    Dulaglutide (TRULICITY) 9.93 ZJ/6.9CV SOPN Inject 0.75 mg into the skin once a week.   empagliflozin (JARDIANCE) 10 MG TABS tablet Take 1 tablet (10 mg total) by mouth daily before breakfast.   escitalopram (LEXAPRO) 10 MG tablet TAKE 1 TABLET EVERY DAY   fluticasone (FLONASE) 50 MCG/ACT nasal spray SPRAY 2 SPRAYS INTO EACH NOSTRIL EVERY DAY   furosemide (LASIX) 40 MG tablet Take 1 tablet (40 mg total) by mouth daily.   glimepiride (AMARYL) 2  MG tablet TAKE 1 TABLET BY MOUTH DAILY WITH BREAKFAST   glucose blood (ACCU-CHEK GUIDE) test strip Check sugars three times daily and as needed when feeling ill E11.65, insulin use   glucose blood test strip Use as instructed to check sugars three times daily E11.65, insulin use   insulin glargine (LANTUS SOLOSTAR) 100 UNIT/ML Solostar Pen Inject 60 Units into the skin every morning. And pen needles 1/day   Insulin Pen Needle (B-D UF III MINI PEN NEEDLES) 31G X 5 MM MISC USE TO INJECT INSULIN DAILY   loratadine (CLARITIN) 10 MG tablet Take 10 mg by mouth daily.   losartan (COZAAR) 50 MG tablet Take 1 tablet (50 mg total) by mouth daily.   lovastatin (MEVACOR) 20 MG tablet Take 1 tablet (20 mg total) by mouth at bedtime.   Magnesium 500 MG TABS Take 1 tablet (500 mg total) by mouth every Monday, Wednesday, and Friday.   metFORMIN (GLUCOPHAGE XR) 500 MG 24 hr tablet Take 1 tablet (500 mg total) by mouth daily with breakfast.   Multiple Vitamins-Minerals ([employer] SPECTRAVITE PO) Take 1 tablet by mouth at bedtime.   mycophenolate (MYFORTIC) 360 MG TBEC EC tablet Take 360 mg by mouth 2 (two) times daily.   Omega-3 Fatty Acids (FISH OIL PO) Take 1 tablet by mouth at bedtime.   omeprazole (PRILOSEC) 40 MG capsule TAKE 1 CAPSULE EVERY DAY   tacrolimus (PROGRAF) 1 MG capsule Take 1-2 mg by mouth See admin instructions. Take 2 mg  by mouth in the morning, then take 1 mg  by mouth in the evening   No facility-administered encounter medications on file as of 06/25/2020.    Current Diagnosis: Patient Active Problem List   Diagnosis Date Noted   Chronic right-sided thoracic back pain 04/07/2020   Memory loss 04/07/2020   Perianal pruritus 02/06/2020   Elevated PSA 02/06/2020   Advanced directives, counseling/discussion 02/06/2020   Acute pain of left foot 01/01/2020   Pain, dental 08/21/2019   Orthostatic hypotension 07/31/2019   Syncope 07/30/2019   Left rib fracture 07/27/2019   Acute respiratory  failure with hypoxia (HCC) 07/18/2019   Pneumonia due to COVID-19 virus 07/18/2019   Earlobe lesion, left 02/05/2018   Chronic heel pain, left 10/14/2017   Anejaculation 08/09/2017   Chronic gout 05/22/2017   Dyspnea 11/18/2016   BPH (benign prostatic hyperplasia) 10/05/2016   Status post liver transplant (HCC) 03/15/2016   Portal hypertensive gastropathy (HCC) 12/04/2015   Type 2 diabetes mellitus with other specified complication (HCC) 11/21/2015   Gynecomastia    CKD (chronic kidney disease) stage 3, GFR 30-59 ml/min (HCC) 11/04/2015   Depression, major, single episode, moderate (HCC) 07/09/2015   Headache 02/19/2015   Insomnia 12/06/2014   Hypomagnesemia    Other fatigue 11/18/2014   Internal hemorrhoid 10/01/2014   Erectile dysfunction 10/01/2014   Alcoholic cirrhosis of liver without ascites (HCC) 07/28/2014   (HFpEF) heart failure with preserved ejection fraction (HCC) 11/01/2013   Essential hypertension 06/04/2013   OSA on CPAP 07/11/2012   History of alcohol dependence (HCC) 07/11/2012   Thrombocytopenia (HCC) 12/15/2011   Morbid obesity with BMI of 40.0-44.9, adult (HCC) 11/16/2011   Routine general medical examination at a health care facility 09/22/2010   NEUROPATHY 05/27/2009   Pedal edema 09/22/2008   Dyslipidemia associated with type 2 diabetes mellitus (HCC) 02/15/2007   Allergic rhinitis 02/15/2007   GERD 02/15/2007    Follow-Up:  Pharmacist Review   Lindsay Saintsing, CMA Clinical Pharmacy Assistant [phone]  Reviewed chart and insurance data for medication adherence. Patient does not have any gaps in adherence and is not in danger of failing any Medicare adherence measures. No further action required.  Michelle Adams, PharmD Clinical Pharmacist [facility] [place] [phone]   

## 2020-06-30 ENCOUNTER — Telehealth: Payer: Self-pay
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
## 2020-07-20 ENCOUNTER — Other Ambulatory Visit: Payer: Self-pay | Admitting: Family Medicine

## 2020-07-21 ENCOUNTER — Other Ambulatory Visit: Payer: Self-pay

## 2020-07-23 ENCOUNTER — Other Ambulatory Visit: Payer: Self-pay

## 2020-07-23 ENCOUNTER — Ambulatory Visit: Payer: Medicare HMO | Admitting: Endocrinology

## 2020-07-23 VITALS — BP 140/82 | HR 83 | Ht 66.5 in | Wt 270.4 lb

## 2020-07-23 DIAGNOSIS — Z794 Long term (current) use of insulin: Secondary | ICD-10-CM

## 2020-07-23 DIAGNOSIS — E1169 Type 2 diabetes mellitus with other specified complication: Secondary | ICD-10-CM | POA: Diagnosis not present

## 2020-07-23 LAB — POCT GLYCOSYLATED HEMOGLOBIN (HGB A1C): Hemoglobin A1C: 10.6 % — AB (ref 4.0–5.6)

## 2020-07-23 MED ORDER — LANTUS SOLOSTAR 100 UNIT/ML ~~LOC~~ SOPN
90.0000 [IU] | PEN_INJECTOR | SUBCUTANEOUS | 3 refills | Status: DC
Start: 2020-07-23 — End: 2020-09-28

## 2020-07-23 NOTE — Patient Instructions (Addendum)
check your blood sugar twice a day.  vary the time of day when you check, between before the 3 meals, and at bedtime.  also check if you have symptoms of your blood sugar being too high or too low.  please keep a record of the readings and bring it to your next appointment here (or you can bring the meter itself).  You can write it on any piece of paper.  please call us sooner if your blood sugar goes below 70, or if you have a lot of readings over 200.   I have sent a prescription to your pharmacy, to increase the Lantus to 90 units each morning   Please come back for a follow-up appointment in 2 months.       Hypoglycemia Hypoglycemia is when the sugar (glucose) level in your blood is too low. Low blood sugar can happen to people who have diabetes and people who do not have diabetes. Low blood sugar can happen quickly, and it can be an emergency. What are the causes? This condition happens most often in people who have diabetes and may be caused by:  Diabetes medicine.  Not eating enough, or not eating often enough.  Doing more physical activity.  Drinking alcohol on an empty stomach. If you do not have diabetes, hypoglycemia may be caused by:  A tumor in the pancreas.  Not eating enough, or not eating for long periods at a time (fasting).  A very bad infection or illness.  Problems after having weight loss (bariatric) surgery.  Kidney failure or liver failure.  Certain medicines. What increases the risk? This condition is more likely to develop in people who:  Have diabetes and take medicines to lower their blood sugar.  Abuse alcohol.  Have a very bad illness. What are the signs or symptoms? Symptoms depend on whether your low blood sugar is mild, moderate, or very low. Mild  Hunger.  Feeling worried or nervous (anxious).  Sweating and feeling clammy.  Feeling dizzy or light-headed.  Being sleepy or having trouble sleeping.  Feeling like you may vomit  (nauseous).  A fast heartbeat.  A headache.  Blurry vision.  Being irritable or grouchy.  Tingling or loss of feeling (numbness) around your mouth, lips, or tongue.  Trouble with moving (coordination). Moderate  Confusion and poor judgment.  Behavior changes.  Weakness.  Uneven heartbeats. Very low Very low blood sugar (severe hypoglycemia) is a medical emergency. It can cause:  Fainting.  Jerky movements that you cannot control (seizure).  Loss of consciousness (coma).  Death. How is this treated? Treating low blood sugar Low blood sugar is often treated by eating or drinking something sugary right away. The snack should contain 15 grams of a fast-acting carb (carbohydrate). Options include:  4 oz (120 mL) of fruit juice.  4-6 oz (120-150 mL) of regular soda (not diet soda).  8 oz (240 mL) of low-fat milk.  Several pieces of hard candy. Check food labels to find out how many to eat for 15 grams.  1 Tbsp (15 mL) of sugar or honey. Treating low blood sugar if you have diabetes If you can think clearly and swallow safely, follow the 15:15 rule:  Take 15 grams of a fast-acting carb. Talk with your doctor about how much you should take.  Always keep a source of fast-acting carb with you, such as: ? Sugar tablets (glucose pills). Take 4 pills. ? Several pieces of hard candy. Check food labels to see  how many pieces to eat for 15 grams. ? 4 oz (120 mL) of fruit juice. ? 4-6 oz (120-150 mL) of regular (not diet) soda. ? 1 Tbsp (15 mL) of honey or sugar.  Check your blood sugar 15 minutes after you take the carb.  If your blood sugar is still at or below 70 mg/dL (3.9 mmol/L), take 15 grams of a carb again.  If your blood sugar does not go above 70 mg/dL (3.9 mmol/L) after 3 tries, get help right away.  After your blood sugar goes back to normal, eat a meal or a snack within 1 hour.   Treating very low blood sugar If your blood sugar is at or below 54 mg/dL (3  mmol/L), you have very low blood sugar, or severe hypoglycemia. This is an emergency. Get medical help right away. If you have very low blood sugar and you cannot eat or drink, you will need to be given a hormone called glucagon. A family member or friend should learn how to check your blood sugar and how to give you glucagon. Ask your doctor if you need to have an emergency glucagon kit at home. Very low blood sugar may also need to be treated in a hospital. Follow these instructions at home: General instructions  Take over-the-counter and prescription medicines only as told by your doctor.  Stay aware of your blood sugar as told by your doctor.  If you drink alcohol: ? Limit how much you use to:  0-1 drink a day for nonpregnant women.  0-2 drinks a day for men. ? Be aware of how much alcohol is in your drink. In the [place] one drink equals one 12 oz bottle of beer (355 mL), one 5 oz glass of wine (148 mL), or one 1 oz glass of hard liquor (44 mL).  Keep all follow-up visits as told by your doctor. This is important. If you have diabetes:  Always have a rapid-acting carb (15 grams) option with you to treat low blood sugar.  Follow your diabetes care plan as told by your doctor. Make sure you: ? Know the symptoms of low blood sugar. ? Check your blood sugar as often as told by your doctor. Always check it before and after exercise. ? Always check your blood sugar before you drive. ? Take your medicines as told. ? Follow your meal plan. ? Eat on time. Do not skip meals.  Share your diabetes care plan with: ? Your work or school. ? People you live with.  Carry a card or wear jewelry that says you have diabetes.   Contact a doctor if:  You have trouble keeping your blood sugar in your target range.  You have low blood sugar often. Get help right away if:  You still have symptoms after you eat or drink something that contains 15 grams of fast-acting carb and you cannot get your  blood sugar above 70 mg/dL by following the 15:15 rule.  Your blood sugar is at or below 54 mg/dL (3 mmol/L).  You have a seizure.  You faint. These symptoms may be an emergency. Do not wait to see if the symptoms will go away. Get medical help right away. Call your local emergency services (911 in the [place] Do not drive yourself to the hospital. Summary  Hypoglycemia happens when the level of sugar (glucose) in your blood is too low.  Low blood sugar can happen to people who have diabetes and people who do not have  diabetes. Low blood sugar can happen quickly, and it can be an emergency.  Make sure you know the symptoms of low blood sugar and know how to treat it.  Always keep a source of sugar (fast-acting carb) with you to treat low blood sugar. This information is not intended to replace advice given to you by your health care provider. Make sure you discuss any questions you have with your health care provider. Document Revised: 05/08/2019 Document Reviewed: 05/08/2019 Elsevier Patient Education  2021 [employer].

## 2020-07-23 NOTE — Progress Notes (Signed)
Subjective:    Patient ID: Colton Porter, male    DOB: 07/11/1965, 55 y.o.   MRN: [id number]  HPI Pt returns for f/u of diabetes mellitus: DM type: Insulin-requiring type 2 Dx'ed: 7510 Complications: CRI Therapy: insulin since 2019 DKA: never Severe hypoglycemia: never Pancreatitis: never Pancreatic imaging: normal on 2021 CT SDOH: Pt says he wants to minimize number of meds; memory loss complicates rx (so he is not a candidate for multiple daily injections); he declines Trulicity, due to cost.  Other: he does not check cbg's Interval history: he does not check cbg's. Pt says he takes insulin as rx'ed.   Past Medical History:  Diagnosis Date  . Alcohol dependence ([place]) 07/11/2012  . Alcoholic cirrhosis of liver with ascites ([place]) 07/2014   s./p transplant 12/2015  . Allergy   . Anemia   . Cellulitis of left leg   . Chronic diastolic heart failure ([place]) 11/01/2013  . CKD (chronic kidney disease) stage 3, GFR 30-59 ml/min ([facility]) 11/04/2015  . Diabetes mellitus without complication ([place])   . GERD (gastroesophageal reflux disease)   . Hyperlipidemia   . Hypertension   . Neuropathy   . OSA on CPAP 07/11/2012   HST 07/2013:  AHI 39/hr.    . Pneumonia due to COVID-19 virus 06/2019  . Thrombocytopenia ([place]) 12/15/2011    Past Surgical History:  Procedure Laterality Date  . COLONOSCOPY  08/2013   hyperplastic polyps, hemorrhoids Ardis Hughs)  . 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 FINGER AMPUTATION  1997   left 4th finger - radial arm saw  . LEFT AND RIGHT HEART CATHETERIZATION WITH CORONARY ANGIOGRAM N/A 06/10/2013   Procedure: LEFT AND RIGHT HEART CATHETERIZATION WITH CORONARY ANGIOGRAM;  Surgeon: Blane Ohara, MD;  Location: [facility] CATH LAB;  Service: Cardiovascular;  Laterality: N/A;  . LIVER TRANSPLANTATION  25/8527   alcoholic cirrhosis ([facility])    Social History   Socioeconomic History  . Marital status: Married     Spouse name: Not on file  . Number of children: 2  . Years of education: Not on file  . Highest education level: Not on file  Occupational History  . Occupation: Investment banker, corporate    Comment: no work lately  Tobacco Use  . Smoking status: Never Smoker  . Smokeless tobacco: Former Systems developer    Types: Secondary school teacher  . Vaping Use: Never used  Substance and Sexual Activity  . Alcohol use: No    Alcohol/week: 0.0 standard drinks    Comment: 4-6 drinks daily - NO ETOH SINCE APRIL 2016  . Drug use: Not Currently    Comment: remote use of marijuana in the past, has since quit  . Sexual activity: Not on file  Other Topics Concern  . Not on file  Social History Narrative    Lives with wife   Occupation: full disability after cirrhosis dx, some working    Activity: some yardwork   Diet: good water, good fruit intake, lots of red meats   Social Determinants of Health   Financial Resource Strain: Low Risk   . Difficulty of Paying Living Expenses: Not hard at all  Food Insecurity: No Food Insecurity  . Worried About Charity fundraiser in the Last Year: Never true  . Ran Out of Food in the Last Year: Never true  Transportation Needs: No Transportation Needs  . Lack of Transportation (Medical): No  . Lack of Transportation (Non-Medical): No  Physical Activity: Inactive  . Days of Exercise per Week: 0 days  . Minutes of Exercise per Session: 0 min  Stress: No Stress Concern Present  . Feeling of Stress : Not at all  Social Connections: Not on file  Intimate Partner Violence: Not At Risk  . Fear of Current or Ex-Partner: No  . Emotionally Abused: No  . Physically Abused: No  . Sexually Abused: No    Current Outpatient Medications on File Prior to Visit  Medication Sig Dispense Refill  . Accu-Chek FastClix Lancets MISC Use three times daily to check sugars E65.11, insulin use 100 each 12  . acetaminophen (TYLENOL) 500 MG tablet Take 500 mg by mouth every 6 (six) hours as needed for  mild pain.    Marland Kitchen allopurinol (ZYLOPRIM) 300 MG tablet Take 1 tablet (300 mg total) by mouth daily. 90 tablet 0  . ASPIR-LOW 81 MG EC tablet Take 1 tablet (81 mg total) by mouth daily. 90 tablet 3  . Blood Glucose Monitoring Suppl (ACCU-CHEK GUIDE) w/Device KIT 1 Units by Does not apply route as directed. 1 kit 0  . Cholecalciferol (VITAMIN D3) 2000 units TABS Take 1 tablet by mouth at bedtime.    . docusate sodium (COLACE) 100 MG capsule Take 100 mg by mouth daily as needed for mild constipation.     . empagliflozin (JARDIANCE) 10 MG TABS tablet Take 1 tablet (10 mg total) by mouth daily before breakfast. 90 tablet 1  . escitalopram (LEXAPRO) 10 MG tablet TAKE 1 TABLET EVERY DAY 90 tablet 3  . fluticasone (FLONASE) 50 MCG/ACT nasal spray SPRAY 2 SPRAYS INTO EACH NOSTRIL EVERY DAY 48 mL 0  . furosemide (LASIX) 40 MG tablet Take 1 tablet (40 mg total) by mouth daily. 30 tablet 3  . glimepiride (AMARYL) 2 MG tablet TAKE 1 TABLET BY MOUTH DAILY WITH BREAKFAST 90 tablet 2  . glucose blood (ACCU-CHEK GUIDE) test strip Check sugars three times daily and as needed when feeling ill E11.65, insulin use 100 each 11  . glucose blood test strip Use as instructed to check sugars three times daily E11.65, insulin use 100 each 12  . Insulin Pen Needle (B-D UF III MINI PEN NEEDLES) 31G X 5 MM MISC USE TO INJECT INSULIN DAILY 100 each 3  . loratadine (CLARITIN) 10 MG tablet Take 10 mg by mouth daily.    Marland Kitchen losartan (COZAAR) 50 MG tablet Take 1 tablet (50 mg total) by mouth daily. 30 tablet 1  . lovastatin (MEVACOR) 20 MG tablet Take 1 tablet (20 mg total) by mouth at bedtime. 90 tablet 3  . Magnesium 500 MG TABS Take 1 tablet (500 mg total) by mouth every Monday, Wednesday, and Friday. 30 tablet   . metFORMIN (GLUCOPHAGE-XR) 500 MG 24 hr tablet TAKE 1 TABLET BY MOUTH EVERY DAY WITH BREAKFAST 90 tablet 1  . Multiple Vitamins-Minerals ([employer]S SPECTRAVITE PO) Take 1 tablet by mouth at bedtime.    . mycophenolate  (MYFORTIC) 360 MG TBEC EC tablet Take 360 mg by mouth 2 (two) times daily.    . Omega-3 Fatty Acids (FISH OIL PO) Take 1 tablet by mouth at bedtime.    Marland Kitchen omeprazole (PRILOSEC) 40 MG capsule TAKE 1 CAPSULE EVERY DAY 90 capsule 3  . tacrolimus (PROGRAF) 1 MG capsule Take 1-2 mg by mouth See admin instructions. Take 2 mg by mouth in the morning, then take 1 mg  by mouth in the evening     No current facility-administered medications  on file prior to visit.    Allergies  Allergen Reactions  . Tolmetin Rash    cirrhosis  . Ambien [Zolpidem Tartrate] Other (See Comments)    Over-toxicity from liver failure  . Morphine And Related     Hallucinations.  . Hydrochlorothiazide W-Triamterene Other (See Comments)    REACTION: dizzy, nausea  . Lisinopril Other (See Comments)    REACTION: cough, decreased libido    Family History  Problem Relation Age of Onset  . Stroke Mother   . Emphysema Mother   . Hypertension Father   . Heart disease Father   . Emphysema Father   . Lung cancer Maternal Aunt   . Stroke Paternal Grandmother   . Heart attack Neg Hx   . Diabetes Neg Hx     BP 140/82 (BP Location: Right Arm, Patient Position: Sitting, Cuff Size: Large)   Pulse 83   Ht 5' 6.5" (1.689 m)   Wt 270 lb 6.4 oz (122.7 kg)   SpO2 96%   BMI 42.99 kg/m    Review of Systems He denies hypoglycemia sxs.      Objective:   Physical Exam VITAL SIGNS:  See vs page GENERAL: no distress.  Feet: exam is declined.    Lab Results  Component Value Date   HGBA1C 10.6 (A) 07/23/2020      Assessment & Plan:  Insulin-requiring type 2 DM, with CRI: uncontrolled Noncompliance with cbg recording: we'll have to slowly titrate insulin, based on A1c.    Patient Instructions   check your blood sugar twice a day.  vary the time of day when you check, between before the 3 meals, and at bedtime.  also check if you have symptoms of your blood sugar being too high or too low.  please keep a record of the  readings and bring it to your next appointment here (or you can bring the meter itself).  You can write it on any piece of paper.  please call us sooner if your blood sugar goes below 70, or if you have a lot of readings over 200.   I have sent a prescription to your pharmacy, to increase the Lantus to 90 units each morning   Please come back for a follow-up appointment in 2 months.       Hypoglycemia Hypoglycemia is when the sugar (glucose) level in your blood is too low. Low blood sugar can happen to people who have diabetes and people who do not have diabetes. Low blood sugar can happen quickly, and it can be an emergency. What are the causes? This condition happens most often in people who have diabetes and may be caused by:  Diabetes medicine.  Not eating enough, or not eating often enough.  Doing more physical activity.  Drinking alcohol on an empty stomach. If you do not have diabetes, hypoglycemia may be caused by:  A tumor in the pancreas.  Not eating enough, or not eating for long periods at a time (fasting).  A very bad infection or illness.  Problems after having weight loss (bariatric) surgery.  Kidney failure or liver failure.  Certain medicines. What increases the risk? This condition is more likely to develop in people who:  Have diabetes and take medicines to lower their blood sugar.  Abuse alcohol.  Have a very bad illness. What are the signs or symptoms? Symptoms depend on whether your low blood sugar is mild, moderate, or very low. Mild  Hunger.  Feeling worried or nervous (  anxious).  Sweating and feeling clammy.  Feeling dizzy or light-headed.  Being sleepy or having trouble sleeping.  Feeling like you may vomit (nauseous).  A fast heartbeat.  A headache.  Blurry vision.  Being irritable or grouchy.  Tingling or loss of feeling (numbness) around your mouth, lips, or tongue.  Trouble with moving  (coordination). Moderate  Confusion and poor judgment.  Behavior changes.  Weakness.  Uneven heartbeats. Very low Very low blood sugar (severe hypoglycemia) is a medical emergency. It can cause:  Fainting.  Jerky movements that you cannot control (seizure).  Loss of consciousness (coma).  Death. How is this treated? Treating low blood sugar Low blood sugar is often treated by eating or drinking something sugary right away. The snack should contain 15 grams of a fast-acting carb (carbohydrate). Options include:  4 oz (120 mL) of fruit juice.  4-6 oz (120-150 mL) of regular soda (not diet soda).  8 oz (240 mL) of low-fat milk.  Several pieces of hard candy. Check food labels to find out how many to eat for 15 grams.  1 Tbsp (15 mL) of sugar or honey. Treating low blood sugar if you have diabetes If you can think clearly and swallow safely, follow the 15:15 rule:  Take 15 grams of a fast-acting carb. Talk with your doctor about how much you should take.  Always keep a source of fast-acting carb with you, such as: ? Sugar tablets (glucose pills). Take 4 pills. ? Several pieces of hard candy. Check food labels to see how many pieces to eat for 15 grams. ? 4 oz (120 mL) of fruit juice. ? 4-6 oz (120-150 mL) of regular (not diet) soda. ? 1 Tbsp (15 mL) of honey or sugar.  Check your blood sugar 15 minutes after you take the carb.  If your blood sugar is still at or below 70 mg/dL (3.9 mmol/L), take 15 grams of a carb again.  If your blood sugar does not go above 70 mg/dL (3.9 mmol/L) after 3 tries, get help right away.  After your blood sugar goes back to normal, eat a meal or a snack within 1 hour.   Treating very low blood sugar If your blood sugar is at or below 54 mg/dL (3 mmol/L), you have very low blood sugar, or severe hypoglycemia. This is an emergency. Get medical help right away. If you have very low blood sugar and you cannot eat or drink, you will need to be  given a hormone called glucagon. A family member or friend should learn how to check your blood sugar and how to give you glucagon. Ask your doctor if you need to have an emergency glucagon kit at home. Very low blood sugar may also need to be treated in a hospital. Follow these instructions at home: General instructions  Take over-the-counter and prescription medicines only as told by your doctor.  Stay aware of your blood sugar as told by your doctor.  If you drink alcohol: ? Limit how much you use to:  0-1 drink a day for nonpregnant women.  0-2 drinks a day for men. ? Be aware of how much alcohol is in your drink. In the [place] one drink equals one 12 oz bottle of beer (355 mL), one 5 oz glass of wine (148 mL), or one 1 oz glass of hard liquor (44 mL).  Keep all follow-up visits as told by your doctor. This is important. If you have diabetes:  Always have a rapid-acting  carb (15 grams) option with you to treat low blood sugar.  Follow your diabetes care plan as told by your doctor. Make sure you: ? Know the symptoms of low blood sugar. ? Check your blood sugar as often as told by your doctor. Always check it before and after exercise. ? Always check your blood sugar before you drive. ? Take your medicines as told. ? Follow your meal plan. ? Eat on time. Do not skip meals.  Share your diabetes care plan with: ? Your work or school. ? People you live with.  Carry a card or wear jewelry that says you have diabetes.   Contact a doctor if:  You have trouble keeping your blood sugar in your target range.  You have low blood sugar often. Get help right away if:  You still have symptoms after you eat or drink something that contains 15 grams of fast-acting carb and you cannot get your blood sugar above 70 mg/dL by following the 15:15 rule.  Your blood sugar is at or below 54 mg/dL (3 mmol/L).  You have a seizure.  You faint. These symptoms may be an emergency. Do not wait  to see if the symptoms will go away. Get medical help right away. Call your local emergency services (911 in the [place] Do not drive yourself to the hospital. Summary  Hypoglycemia happens when the level of sugar (glucose) in your blood is too low.  Low blood sugar can happen to people who have diabetes and people who do not have diabetes. Low blood sugar can happen quickly, and it can be an emergency.  Make sure you know the symptoms of low blood sugar and know how to treat it.  Always keep a source of sugar (fast-acting carb) with you to treat low blood sugar. This information is not intended to replace advice given to you by your health care provider. Make sure you discuss any questions you have with your health care provider. Document Revised: 05/08/2019 Document Reviewed: 05/08/2019 Elsevier Patient Education  2021 [employer].

## 2020-07-28 DIAGNOSIS — Z944 Liver transplant status: Secondary | ICD-10-CM | POA: Diagnosis not present

## 2020-07-28 DIAGNOSIS — D849 Immunodeficiency, unspecified: Secondary | ICD-10-CM | POA: Diagnosis not present

## 2020-07-28 DIAGNOSIS — Z6841 Body Mass Index (BMI) 40.0 and over, adult: Secondary | ICD-10-CM | POA: Diagnosis not present

## 2020-07-30 ENCOUNTER — Telehealth: Payer: Self-pay

## 2020-07-30 NOTE — Chronic Care Management (AMB) (Addendum)
Chronic Care Management Pharmacy Assistant   Name: Colton Porter  MRN: [id number] DOB: 1966-01-22  Reason for Encounter: Disease State- Diabetes  Patient Question:  1.  Have you seen any other providers since your last visit? Yes 07/23/20- Dr. Renato Shin- Endocrinologist- Increased insulin from 80 units to 90 units every morning.  PCP started Trulicity - patient never started due to cost Cardiology started Jardiance - patient never started due to concern for side effects    PCP : Ria Bush, MD  Allergies:   Allergies  Allergen Reactions   Tolmetin Rash    cirrhosis   Ambien [Zolpidem Tartrate] Other (See Comments)    Over-toxicity from liver failure   Morphine And Related     Hallucinations.   Hydrochlorothiazide W-Triamterene Other (See Comments)    REACTION: dizzy, nausea   Lisinopril Other (See Comments)    REACTION: cough, decreased libido    Medications: Outpatient Encounter Medications as of 07/30/2020  Medication Sig   Accu-Chek FastClix Lancets MISC Use three times daily to check sugars E65.11, insulin use   acetaminophen (TYLENOL) 500 MG tablet Take 500 mg by mouth every 6 (six) hours as needed for mild pain.   allopurinol (ZYLOPRIM) 300 MG tablet Take 1 tablet (300 mg total) by mouth daily.   ASPIR-LOW 81 MG EC tablet Take 1 tablet (81 mg total) by mouth daily.   Blood Glucose Monitoring Suppl (ACCU-CHEK GUIDE) w/Device KIT 1 Units by Does not apply route as directed.   Cholecalciferol (VITAMIN D3) 2000 units TABS Take 1 tablet by mouth at bedtime.   docusate sodium (COLACE) 100 MG capsule Take 100 mg by mouth daily as needed for mild constipation.    empagliflozin (JARDIANCE) 10 MG TABS tablet Take 1 tablet (10 mg total) by mouth daily before breakfast.   escitalopram (LEXAPRO) 10 MG tablet TAKE 1 TABLET EVERY DAY   fluticasone (FLONASE) 50 MCG/ACT nasal spray SPRAY 2 SPRAYS INTO EACH NOSTRIL EVERY DAY   furosemide (LASIX) 40 MG tablet Take 1 tablet  (40 mg total) by mouth daily.   glimepiride (AMARYL) 2 MG tablet TAKE 1 TABLET BY MOUTH DAILY WITH BREAKFAST   glucose blood (ACCU-CHEK GUIDE) test strip Check sugars three times daily and as needed when feeling ill E11.65, insulin use   glucose blood test strip Use as instructed to check sugars three times daily E11.65, insulin use   insulin glargine (LANTUS SOLOSTAR) 100 UNIT/ML Solostar Pen Inject 90 Units into the skin every morning. And pen needles 1/day   Insulin Pen Needle (B-D UF III MINI PEN NEEDLES) 31G X 5 MM MISC USE TO INJECT INSULIN DAILY   loratadine (CLARITIN) 10 MG tablet Take 10 mg by mouth daily.   losartan (COZAAR) 50 MG tablet Take 1 tablet (50 mg total) by mouth daily.   lovastatin (MEVACOR) 20 MG tablet Take 1 tablet (20 mg total) by mouth at bedtime.   Magnesium 500 MG TABS Take 1 tablet (500 mg total) by mouth every Monday, Wednesday, and Friday.   metFORMIN (GLUCOPHAGE-XR) 500 MG 24 hr tablet TAKE 1 TABLET BY MOUTH EVERY DAY WITH BREAKFAST   Multiple Vitamins-Minerals (CVS SPECTRAVITE PO) Take 1 tablet by mouth at bedtime.   mycophenolate (MYFORTIC) 360 MG TBEC EC tablet Take 360 mg by mouth 2 (two) times daily.   Omega-3 Fatty Acids (FISH OIL PO) Take 1 tablet by mouth at bedtime.   omeprazole (PRILOSEC) 40 MG capsule TAKE 1 CAPSULE EVERY DAY   tacrolimus (PROGRAF) 1  MG capsule Take 1-2 mg by mouth See admin instructions. Take 2 mg by mouth in the morning, then take 1 mg  by mouth in the evening   No facility-administered encounter medications on file as of 07/30/2020.    Current Diagnosis: Patient Active Problem List   Diagnosis Date Noted   Chronic right-sided thoracic back pain 04/07/2020   Memory loss 04/07/2020   Perianal pruritus 02/06/2020   Elevated PSA 02/06/2020   Advanced directives, counseling/discussion 02/06/2020   Acute pain of left foot 01/01/2020   Pain, dental 08/21/2019   Orthostatic hypotension 07/31/2019   Syncope 07/30/2019   Left rib  fracture 07/27/2019   Acute respiratory failure with hypoxia ([place]) 07/18/2019   Pneumonia due to COVID-19 virus 07/18/2019   [place] lesion, left 02/05/2018   Chronic heel pain, left 10/14/2017   Anejaculation 08/09/2017   Chronic gout 05/22/2017   Dyspnea 11/18/2016   BPH (benign prostatic hyperplasia) 10/05/2016   Status post liver transplant ([place]) 03/15/2016   Portal hypertensive gastropathy ([place]) 12/04/2015   Type 2 diabetes mellitus with other specified complication ([place]) 38/03/1750   Gynecomastia    CKD (chronic kidney disease) stage 3, GFR 30-59 ml/min ([place]) 11/04/2015   Depression, major, single episode, moderate ([place]) 07/09/2015   Headache 02/19/2015   Insomnia 12/06/2014   Hypomagnesemia    Other fatigue 11/18/2014   Internal hemorrhoid 10/01/2014   Erectile dysfunction 02/58/5277   Alcoholic cirrhosis of liver without ascites ([place]) 07/28/2014   (HFpEF) heart failure with preserved ejection fraction ([place]) 11/01/2013   Essential hypertension 06/04/2013   OSA on CPAP 07/11/2012   History of alcohol dependence ([place]) 07/11/2012   Thrombocytopenia ([place]) 12/15/2011   Morbid obesity with BMI of 40.0-44.9, adult ([place]) 11/16/2011   Routine general medical examination at a health care facility 09/22/2010   NEUROPATHY 05/27/2009   Pedal edema 09/22/2008   Dyslipidemia associated with type 2 diabetes mellitus ([place]) 02/15/2007   Allergic rhinitis 02/15/2007   GERD 02/15/2007    Recent Relevant Labs: Lab Results  Component Value Date/Time   HGBA1C 10.6 (A) 07/23/2020 08:09 AM   HGBA1C 11.4 (A) 04/06/2020 09:31 AM   HGBA1C 10.6 (H) 01/22/2020 07:28 AM   HGBA1C 10.1 (H) 07/18/2019 11:33 AM   MICROALBUR <0.7 08/27/2014 09:51 AM    Kidney Function Lab Results  Component Value Date/Time   CREATININE 1.17 04/07/2020 08:39 AM   CREATININE 1.30 01/22/2020 07:28 AM   CREATININE 2.06 (H) 06/14/2016 08:56 AM   CREATININE 1.51 (H) 06/08/2016 12:06 PM   CREATININE 0.9 08/02/2012  08:39 AM   GFR 70.48 04/07/2020 08:39 AM   GFRNONAA >60 08/02/2019 01:09 AM   GFRAA >60 08/02/2019 01:09 AM    Current antihyperglycemic regimen:  Glimepiride 2 mg - 1 tablet daily with breakfast Lantus - Inject 90 units daily  Metformin 500 mg - 1 tablet daily  Patient was prescribed Trulicity by Dr. [name] but patient has not started due to cost. Dr. Loanne Drilling is aware.  What recent interventions/DTPs have been made to improve glycemic control:  Dr. Loanne Drilling increased Lantus from 80 to 90 units   Have there been any recent hospitalizations or ED visits since last visit with CPP? No  Patient denies hypoglycemic symptoms, including Pale, Sweaty, Shaky, Hungry, Nervous/irritable and Vision changes  Patient denies hyperglycemic symptoms, including blurry vision, excessive thirst, fatigue, polyuria and weakness  How often are you checking your blood sugar? Patient states he has not checked in 3 weeks. States he was discouraged with medication costs  and did not feel like checking it.  What are your blood sugars ranging? Unknown, patient not checking. Patient did have a recent A1C 07/23/20 it was 10.6% which is an improvement from has last A1C. Patient does feel hopeful about his A1C.  On insulin? Yes- Lantus How many units: 90 What time of day: Bedtime  During the week, how often does your blood glucose drop below 70? Unknown  Are you checking your feet daily/regularly? Yes, states he looks over his feet almost daily. Denies any wounds, sores or numbness.  Adherence Review: Is the patient currently on a STATIN medication? Yes Is the patient currently on ACE/ARB medication? Yes Does the patient have >5 day gap between last estimated fill dates? CPP to review  Discussed with patient importance of monitoring blood glucose. Patient states he is aware of his upcoming appointment with Dr. [name] in the next couple of weeks and agreed to start checking his blood sugars prior to the  appointment. Asked patient if he would be interested in [name] helping him apply for patient assistance for Trulicity but he declined. States that he wants to see how the increase in his Lantus helps. Explained the process for the Trulicity assistance application and told him if he changed his mind we would be happy to help him apply.   Follow-Up:  Patient Assistance Coordination and Pharmacist Review   Debbora Dus, CPP notified  Margaretmary Dys, [place] Assistant [phone]  I have reviewed the care management and care coordination activities outlined in this encounter and I am certifying that I agree with the content of this note. Patient has follow up with PCP on 08/07/20 and CCM visit 09/02/20. Will continue close follow up.  Debbora Dus, PharmD Clinical Pharmacist [facility] [phone]

## 2020-08-06 ENCOUNTER — Other Ambulatory Visit: Payer: Self-pay

## 2020-08-06 DIAGNOSIS — Z79899 Other long term (current) drug therapy: Secondary | ICD-10-CM | POA: Diagnosis not present

## 2020-08-06 DIAGNOSIS — Z48298 Encounter for aftercare following other organ transplant: Secondary | ICD-10-CM | POA: Diagnosis not present

## 2020-08-06 DIAGNOSIS — Z944 Liver transplant status: Secondary | ICD-10-CM | POA: Diagnosis not present

## 2020-08-07 ENCOUNTER — Ambulatory Visit (INDEPENDENT_AMBULATORY_CARE_PROVIDER_SITE_OTHER): Payer: Medicare HMO | Admitting: Family Medicine

## 2020-08-07 ENCOUNTER — Other Ambulatory Visit: Payer: Self-pay

## 2020-08-07 ENCOUNTER — Encounter: Payer: Self-pay | Admitting: Family Medicine

## 2020-08-07 VITALS — BP 140/78 | HR 80 | Temp 98.0°F | Ht 66.5 in | Wt 270.1 lb

## 2020-08-07 DIAGNOSIS — I10 Essential (primary) hypertension: Secondary | ICD-10-CM | POA: Diagnosis not present

## 2020-08-07 DIAGNOSIS — I5032 Chronic diastolic (congestive) heart failure: Secondary | ICD-10-CM

## 2020-08-07 DIAGNOSIS — Z794 Long term (current) use of insulin: Secondary | ICD-10-CM

## 2020-08-07 DIAGNOSIS — E1169 Type 2 diabetes mellitus with other specified complication: Secondary | ICD-10-CM | POA: Diagnosis not present

## 2020-08-07 NOTE — Assessment & Plan Note (Signed)
Chronic, adequate. Continue current regimen.  

## 2020-08-07 NOTE — Assessment & Plan Note (Signed)
Discussed jardiance should help this as well as be cardioprotective.

## 2020-08-07 NOTE — Progress Notes (Signed)
Patient ID: Colton Porter, male    DOB: 05/22/1966, 55 y.o.   MRN: [id number]  This visit was conducted in person.  BP 140/78   Pulse 80   Temp 98 F (36.7 C) (Temporal)   Ht 5' 6.5" (1.689 m)   Wt 270 lb 1 oz (122.5 kg)   SpO2 96%   BMI 42.94 kg/m    CC: 4 mo f/u visit  Subjective:   HPI: Colton Porter is a 55 y.o. male presenting on 08/07/2020 for Follow-up (Here for 4 mo f/u.)   DM - does not regularly check sugars. Fasting yesterday 348, today 221. Compliant with antihyperglycemic regimen which includes: lantus 90u daily, metformin XR $RemoveBefo'500mg'eMUHeFNqrYX$  daily, amaryl $RemoveBefor'2mg'hHPYNLXCyERJ$  daily. Trulicity never started due to cost. ozempic was not tolerated (GI side effects). Jardiance never started due to concern over side effects. Considering applying for PAP. Denies low sugars or hypoglycemic symptoms. L foot paresthesias. Last diabetic eye exam 02/2020. Pneumovax: 2016. Prevnar: not due. Glucometer brand: accuchek. DSME: completed 08/2017.  Lab Results  Component Value Date   HGBA1C 10.6 (A) 07/23/2020   Diabetic Foot Exam - Simple   Simple Foot Form Diabetic Foot exam was performed with the following findings: Yes 08/07/2020  8:26 AM  Visual Inspection No deformities, no ulcerations, no other skin breakdown bilaterally: Yes Sensation Testing Intact to touch and monofilament testing bilaterally: Yes Pulse Check Posterior Tibialis and Dorsalis pulse intact bilaterally: Yes Comments    Lab Results  Component Value Date   MICROALBUR <0.7 08/27/2014         Relevant past medical, surgical, family and social history reviewed and updated as indicated. Interim medical history since our last visit reviewed. Allergies and medications reviewed and updated. Outpatient Medications Prior to Visit  Medication Sig Dispense Refill  . Accu-Chek FastClix Lancets MISC Use three times daily to check sugars E65.11, insulin use 100 each 12  . acetaminophen (TYLENOL) 500 MG tablet Take 500 mg by mouth every  6 (six) hours as needed for mild pain.    Marland Kitchen allopurinol (ZYLOPRIM) 300 MG tablet Take 1 tablet (300 mg total) by mouth daily. 90 tablet 0  . ASPIR-LOW 81 MG EC tablet Take 1 tablet (81 mg total) by mouth daily. 90 tablet 3  . Blood Glucose Monitoring Suppl (ACCU-CHEK GUIDE) w/Device KIT 1 Units by Does not apply route as directed. 1 kit 0  . Cholecalciferol (VITAMIN D3) 2000 units TABS Take 1 tablet by mouth at bedtime.    . docusate sodium (COLACE) 100 MG capsule Take 100 mg by mouth daily as needed for mild constipation.     . empagliflozin (JARDIANCE) 10 MG TABS tablet Take 1 tablet (10 mg total) by mouth daily before breakfast. 90 tablet 1  . escitalopram (LEXAPRO) 10 MG tablet TAKE 1 TABLET EVERY DAY 90 tablet 3  . fluticasone (FLONASE) 50 MCG/ACT nasal spray SPRAY 2 SPRAYS INTO EACH NOSTRIL EVERY DAY 48 mL 0  . furosemide (LASIX) 40 MG tablet Take 1 tablet (40 mg total) by mouth daily. 30 tablet 3  . glimepiride (AMARYL) 2 MG tablet TAKE 1 TABLET BY MOUTH DAILY WITH BREAKFAST 90 tablet 2  . glucose blood (ACCU-CHEK GUIDE) test strip Check sugars three times daily and as needed when feeling ill E11.65, insulin use 100 each 11  . glucose blood test strip Use as instructed to check sugars three times daily E11.65, insulin use 100 each 12  . insulin glargine (LANTUS SOLOSTAR) 100 UNIT/ML Solostar  Pen Inject 90 Units into the skin every morning. And pen needles 1/day 90 mL 3  . Insulin Pen Needle (B-D UF III MINI PEN NEEDLES) 31G X 5 MM MISC USE TO INJECT INSULIN DAILY 100 each 3  . loratadine (CLARITIN) 10 MG tablet Take 10 mg by mouth daily.    Marland Kitchen losartan (COZAAR) 50 MG tablet Take 1 tablet (50 mg total) by mouth daily. 30 tablet 1  . lovastatin (MEVACOR) 20 MG tablet Take 1 tablet (20 mg total) by mouth at bedtime. 90 tablet 3  . Magnesium 500 MG TABS Take 1 tablet (500 mg total) by mouth every Monday, Wednesday, and Friday. 30 tablet   . metFORMIN (GLUCOPHAGE-XR) 500 MG 24 hr tablet TAKE 1  TABLET BY MOUTH EVERY DAY WITH BREAKFAST 90 tablet 1  . Multiple Vitamins-Minerals ([employer]S SPECTRAVITE PO) Take 1 tablet by mouth at bedtime.    . mycophenolate (MYFORTIC) 360 MG TBEC EC tablet Take 360 mg by mouth 2 (two) times daily.    . Omega-3 Fatty Acids (FISH OIL PO) Take 1 tablet by mouth at bedtime.    Marland Kitchen omeprazole (PRILOSEC) 40 MG capsule TAKE 1 CAPSULE EVERY DAY 90 capsule 3  . tacrolimus (PROGRAF) 1 MG capsule Take 1-2 mg by mouth See admin instructions. Take 2 mg by mouth in the morning, then take 1 mg  by mouth in the evening     No facility-administered medications prior to visit.     Per HPI unless specifically indicated in ROS section below Review of Systems Objective:  BP 140/78   Pulse 80   Temp 98 F (36.7 C) (Temporal)   Ht 5' 6.5" (1.689 m)   Wt 270 lb 1 oz (122.5 kg)   SpO2 96%   BMI 42.94 kg/m   Wt Readings from Last 3 Encounters:  08/07/20 270 lb 1 oz (122.5 kg)  07/23/20 270 lb 6.4 oz (122.7 kg)  06/08/20 266 lb 12.8 oz (121 kg)      Physical Exam Vitals and nursing note reviewed.  Constitutional:      General: He is not in acute distress.    Appearance: Normal appearance. He is well-developed and well-nourished. He is not ill-appearing.  HENT:     Mouth/Throat:     Mouth: Oropharynx is clear and moist.  Eyes:     General: No scleral icterus.    Extraocular Movements: Extraocular movements intact and EOM normal.     Conjunctiva/sclera: Conjunctivae normal.     Pupils: Pupils are equal, round, and reactive to light.  Cardiovascular:     Rate and Rhythm: Normal rate and regular rhythm.     Pulses: Normal pulses and intact distal pulses.     Heart sounds: Normal heart sounds. No murmur heard.   Pulmonary:     Effort: Pulmonary effort is normal. No respiratory distress.     Breath sounds: Normal breath sounds. No wheezing, rhonchi or rales.  Musculoskeletal:        General: No edema.     Right lower leg: No edema.     Left lower leg: No edema.      Comments: See HPI for foot exam if done  Skin:    General: Skin is warm and dry.     Findings: No rash.  Neurological:     Mental Status: He is alert.  Psychiatric:        Mood and Affect: Mood and affect and mood normal.  Behavior: Behavior normal.       Results for orders placed or performed in visit on 07/23/20  POCT glycosylated hemoglobin (Hb A1C)  Result Value Ref Range   Hemoglobin A1C 10.6 (A) 4.0 - 5.6 %   HbA1c POC (<> result, manual entry)     HbA1c, POC (prediabetic range)     HbA1c, POC (controlled diabetic range)     Assessment & Plan:  This visit occurred during the SARS-CoV-2 public health emergency.  Safety protocols were in place, including screening questions prior to the visit, additional usage of staff PPE, and extensive cleaning of exam room while observing appropriate contact time as indicated for disinfecting solutions.   Problem List Items Addressed This Visit    Type 2 diabetes mellitus with other specified complication ([place]) - Primary (Chronic)    Appreciate endo care - continues titration of lantus, but impaired fasting sugars persist.  Did not tolerate ozempic previously (2020).  Agrees to try jardiance - reviewed watching for UTI, yeast infection, or groin cellulitis symptoms.       Essential hypertension (Chronic)    Chronic, adequate. Continue current regimen.       (HFpEF) heart failure with preserved ejection fraction (HCC)    Discussed jardiance should help this as well as be cardioprotective.           No orders of the defined types were placed in this encounter.  No orders of the defined types were placed in this encounter.   Patient Instructions  Pick up Jardiance at the pharmacy, let us know if any trouble filling or any trouble tolerating.  Watch for UTI or yeast infection or groin infection on this medicine.  Return in 3-4 months for diabetes follow up visit.   Follow up plan: Return in about 3 months (around  11/04/2020), or if symptoms worsen or fail to improve, for follow up visit.  Ria Bush, MD

## 2020-08-07 NOTE — Patient Instructions (Addendum)
Pick up Jardiance at the pharmacy, let us know if any trouble filling or any trouble tolerating.  Watch for UTI or yeast infection or groin infection on this medicine.  Return in 3-4 months for diabetes follow up visit.

## 2020-08-07 NOTE — Assessment & Plan Note (Addendum)
Appreciate endo care - continues titration of lantus, but impaired fasting sugars persist.  Did not tolerate ozempic previously (2020).  Agrees to try jardiance - reviewed watching for UTI, yeast infection, or groin cellulitis symptoms.

## 2020-09-02 ENCOUNTER — Telehealth: Payer: Self-pay

## 2020-09-02 ENCOUNTER — Telehealth: Payer: Medicare HMO

## 2020-09-02 NOTE — Chronic Care Management (AMB) (Addendum)
Chronic Care Management Pharmacy Assistant   Name: Colton Porter  MRN: [id number] DOB: [birth year]  Reason for Encounter: Disease State- Diabetes   Conditions to be addressed/monitored: DMII   Recent office visits:  08/07/20- Dr. [name]- PCP - pt agrees to start [place]  Recent consult visits:  None  Hospital visits:  None in previous 6 months  Medications: Outpatient Encounter Medications as of 09/02/2020  Medication Sig   Accu-Chek FastClix Lancets MISC Use three times daily to check sugars E65.11, insulin use   acetaminophen (TYLENOL) 500 MG tablet Take 500 mg by mouth every 6 (six) hours as needed for mild pain.   allopurinol (ZYLOPRIM) 300 MG tablet Take 1 tablet (300 mg total) by mouth daily.   ASPIR-LOW 81 MG EC tablet Take 1 tablet (81 mg total) by mouth daily.   Blood Glucose Monitoring Suppl (ACCU-CHEK GUIDE) w/Device KIT 1 Units by Does not apply route as directed.   Cholecalciferol (VITAMIN D3) 2000 units TABS Take 1 tablet by mouth at bedtime.   docusate sodium (COLACE) 100 MG capsule Take 100 mg by mouth daily as needed for mild constipation.    empagliflozin (JARDIANCE) 10 MG TABS tablet Take 1 tablet (10 mg total) by mouth daily before breakfast.   escitalopram (LEXAPRO) 10 MG tablet TAKE 1 TABLET EVERY DAY   fluticasone (FLONASE) 50 MCG/ACT nasal spray SPRAY 2 SPRAYS INTO EACH NOSTRIL EVERY DAY   furosemide (LASIX) 40 MG tablet Take 1 tablet (40 mg total) by mouth daily.   glimepiride (AMARYL) 2 MG tablet TAKE 1 TABLET BY MOUTH DAILY WITH BREAKFAST   glucose blood (ACCU-CHEK GUIDE) test strip Check sugars three times daily and as needed when feeling ill E11.65, insulin use   glucose blood test strip Use as instructed to check sugars three times daily E11.65, insulin use   insulin glargine (LANTUS SOLOSTAR) 100 UNIT/ML Solostar Pen Inject 90 Units into the skin every morning. And pen needles 1/day   Insulin Pen Needle (B-D UF III MINI PEN NEEDLES) 31G X 5  MM MISC USE TO INJECT INSULIN DAILY   loratadine (CLARITIN) 10 MG tablet Take 10 mg by mouth daily.   losartan (COZAAR) 50 MG tablet Take 1 tablet (50 mg total) by mouth daily.   lovastatin (MEVACOR) 20 MG tablet Take 1 tablet (20 mg total) by mouth at bedtime.   Magnesium 500 MG TABS Take 1 tablet (500 mg total) by mouth every Monday, Wednesday, and Friday.   metFORMIN (GLUCOPHAGE-XR) 500 MG 24 hr tablet TAKE 1 TABLET BY MOUTH EVERY DAY WITH BREAKFAST   Multiple Vitamins-Minerals (CVS SPECTRAVITE PO) Take 1 tablet by mouth at bedtime.   mycophenolate (MYFORTIC) 360 MG TBEC EC tablet Take 360 mg by mouth 2 (two) times daily.   Omega-3 Fatty Acids (FISH OIL PO) Take 1 tablet by mouth at bedtime.   omeprazole (PRILOSEC) 40 MG capsule TAKE 1 CAPSULE EVERY DAY   tacrolimus (PROGRAF) 1 MG capsule Take 1-2 mg by mouth See admin instructions. Take 2 mg by mouth in the morning, then take 1 mg  by mouth in the evening   No facility-administered encounter medications on file as of 09/02/2020.     Recent Relevant Labs: Lab Results  Component Value Date/Time   HGBA1C 10.6 (A) 07/23/2020 08:09 AM   HGBA1C 11.4 (A) 04/06/2020 09:31 AM   HGBA1C 10.6 (H) 01/22/2020 07:28 AM   HGBA1C 10.1 (H) 07/18/2019 11:33 AM   MICROALBUR <0.7 08/27/2014 09:51 AM  Kidney Function Lab Results  Component Value Date/Time   CREATININE 1.17 04/07/2020 08:39 AM   CREATININE 1.30 01/22/2020 07:28 AM   CREATININE 2.06 (H) 06/14/2016 08:56 AM   CREATININE 1.51 (H) 06/08/2016 12:06 PM   CREATININE 0.9 08/02/2012 08:39 AM   GFR 70.48 04/07/2020 08:39 AM   GFRNONAA >60 08/02/2019 01:09 AM   GFRAA >60 08/02/2019 01:09 AM    Current antihyperglycemic regimen:  Glimepiride 2 mg - 1 tablet daily with breakfast Lantus - Inject 90 units daily  Metformin 500 mg - 1 tablet daily Jardiance 10 mg- 1 tablet daily   What recent interventions/DTPs have been made to improve glycemic control:  Started Jardiance 07/2019. Patient  has not been checking blood sugar.   Have there been any recent hospitalizations or ED visits since last visit with CPP? No  Patient denies hypoglycemic symptoms, including Pale, Sweaty, Shaky, Hungry, Nervous/irritable and Vision changes  Patient denies hyperglycemic symptoms, including blurry vision, excessive thirst, fatigue, polyuria and weakness  How often are you checking your blood sugar? twice daily  What are your blood sugars ranging?  Fasting: 09/02/20- 267 09/03/20- 273 09/04/20- 248  Before meals: N/A After meals:  [phone] Bedtime: N/A  On insulin? Yes How many units: Lantus - Inject 90 units daily  During the week, how often does your blood glucose drop below 70? Never  Are you checking your feet daily/regularly? Yes- states feet numb and tingling sometimes. States this started a few months ago. Mainly 2nd toe on right foot. Not constant.  Adherence Review: Is the patient currently on a STATIN medication? Yes Is the patient currently on ACE/ARB medication? Yes Does the patient have >5 day gap between last estimated fill dates? Yes, gaps in all star medications  Star Rating Drugs: Jardiance 10 mg - Last filled 06/08/20- 90 DS Glimepride 2 mg- Last filled 05/20/20- 90 DS Losartan 50 mg- Last filled 05/29/20- 90 DS Lovastatin 20 mg- Last filled 05/17/20- 90 DS Metformin 500 mg- Last filled 04/25/20 - 90 DS  Follow-Up:  Pharmacist Review  Debbora Dus, CPP notified  Margaretmary Dys, [place] [phone]  I have reviewed the care management and care coordination activities outlined in this encounter and I am certifying that I agree with the content of this note. Will schedule CCM visit to discuss adherence - I think a Dexcom sample would be a good idea.   Debbora Dus, PharmD Clinical Pharmacist [facility] [phone]

## 2020-09-08 NOTE — Chronic Care Management (AMB) (Addendum)
Spoke with patient and explained reading variations within 10% were considered acceptable with glucometer. Also discussed Trulicity patient assistance application. Patient is willing to try Trulicity if he qualifies for patient assistance. He will come in to Dr. Bosie Clos office today 7/42/59 to sign application.   Follow-Up:  Patient Assistance Coordination and Pharmacist Review  Debbora Dus, CPP notified  Margaretmary Dys, [place] Pharmacy Assistant [phone]

## 2020-09-08 NOTE — Chronic Care Management (AMB) (Addendum)
Attempted to reach patient to discuss his concern about glucometer accuracy, left VM.  Margaretmary Dys, [place] Pharmacy Assistant [phone]

## 2020-09-22 ENCOUNTER — Ambulatory Visit: Payer: Medicare HMO | Admitting: Endocrinology

## 2020-09-28 ENCOUNTER — Other Ambulatory Visit: Payer: Self-pay

## 2020-09-28 ENCOUNTER — Ambulatory Visit: Payer: Medicare HMO | Admitting: Endocrinology

## 2020-09-28 VITALS — BP 114/70 | HR 89 | Ht 66.5 in | Wt 269.4 lb

## 2020-09-28 DIAGNOSIS — Z794 Long term (current) use of insulin: Secondary | ICD-10-CM | POA: Diagnosis not present

## 2020-09-28 DIAGNOSIS — E1169 Type 2 diabetes mellitus with other specified complication: Secondary | ICD-10-CM | POA: Diagnosis not present

## 2020-09-28 LAB — POCT GLYCOSYLATED HEMOGLOBIN (HGB A1C): Hemoglobin A1C: 9.2 % — AB (ref 4.0–5.6)

## 2020-09-28 MED ORDER — LANTUS SOLOSTAR 100 UNIT/ML ~~LOC~~ SOPN: 100.0000 [IU] | PEN_INJECTOR | SUBCUTANEOUS | 3 refills | Status: DC

## 2020-09-28 NOTE — Progress Notes (Signed)
Subjective:    Patient ID: Colton Porter, male    DOB: June 14, 1966, 55 y.o.   MRN: [id number]  HPI Pt returns for f/u of diabetes mellitus:  DM type: Insulin-requiring type 2 Dx'ed: 4235 Complications: CRI Therapy: insulin since 2019 DKA: never Severe hypoglycemia: never Pancreatitis: never Pancreatic imaging: normal on 2021 CT SDOH: Pt says he wants to minimize number of meds; memory loss complicates rx (so he is not a candidate for multiple daily injections); he declines Trulicity, due to cost.  Other: he does not check cbg's.   Interval history: he does not check cbg's. Pt says he takes insulin as rx'ed.  pt states he feels well in general. Past Medical History:  Diagnosis Date  . Alcohol dependence ([place]) 07/11/2012  . Alcoholic cirrhosis of liver with ascites ([place]) 07/2014   s./p transplant 12/2015  . Allergy   . Anemia   . Cellulitis of left leg   . Chronic diastolic heart failure ([place]) 11/01/2013  . CKD (chronic kidney disease) stage 3, GFR 30-59 ml/min (HCC) 11/04/2015  . Diabetes mellitus without complication ([place])   . GERD (gastroesophageal reflux disease)   . Hyperlipidemia   . Hypertension   . Neuropathy   . OSA on CPAP 07/11/2012   HST 07/2013:  AHI 39/hr.    . Pneumonia due to COVID-19 virus 06/2019  . Thrombocytopenia ([place]) 12/15/2011    Past Surgical History:  Procedure Laterality Date  . COLONOSCOPY  08/2013   hyperplastic polyps, hemorrhoids Ardis Hughs)  . 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 FINGER AMPUTATION  1997   left 4th finger - radial arm saw  . LEFT AND RIGHT HEART CATHETERIZATION WITH CORONARY ANGIOGRAM N/A 06/10/2013   Procedure: LEFT AND RIGHT HEART CATHETERIZATION WITH CORONARY ANGIOGRAM;  Surgeon: Blane Ohara, MD;  Location[facility] C[facility] LAB;  Service: Cardiovascular;  Laterality: N/A;  . LIVER TRANSPLANTATION  36/1443   alcoholic cirrhosis ([facility])    Social History   Socioeconomic  History  . Marital status: Married    Spouse name: Not on file  . Number of children: 2  . Years of education: Not on file  . Highest education level: Not on file  Occupational History  . Occupation: Investment banker, corporate    Comment: no work lately  Tobacco Use  . Smoking status: Never Smoker  . Smokeless tobacco: Former Systems developer    Types: Secondary school teacher  . Vaping Use: Never used  Substance and Sexual Activity  . Alcohol use: No    Alcohol/week: 0.0 standard drinks    Comment: 4-6 drinks daily - NO ETOH SINCE APRIL 2016  . Drug use: Not Currently    Comment: remote use of marijuana in the past, has since quit  . Sexual activity: Not on file  Other Topics Concern  . Not on file  Social History Narrative    Lives with wife   Occupation: full disability after cirrhosis dx, some working    Activity: some yardwork   Diet: good water, good fruit intake, lots of red meats   Social Determinants of Health   Financial Resource Strain: Low Risk   . Difficulty of Paying Living Expenses: Not hard at all  Food Insecurity: No Food Insecurity  . Worried About Charity fundraiser in the Last Year: Never true  . Ran Out of Food in the Last Year: Never true  Transportation Needs: No Transportation Needs  . Lack of Transportation (  Medical): No  . Lack of Transportation (Non-Medical): No  Physical Activity: Inactive  . Days of Exercise per Week: 0 days  . Minutes of Exercise per Session: 0 min  Stress: No Stress Concern Present  . Feeling of Stress : Not at all  Social Connections: Not on file  Intimate Partner Violence: Not At Risk  . Fear of Current or Ex-Partner: No  . Emotionally Abused: No  . Physically Abused: No  . Sexually Abused: No    Current Outpatient Medications on File Prior to Visit  Medication Sig Dispense Refill  . Accu-Chek FastClix Lancets MISC Use three times daily to check sugars E65.11, insulin use 100 each 12  . acetaminophen (TYLENOL) 500 MG tablet Take 500 mg by  mouth every 6 (six) hours as needed for mild pain.    Marland Kitchen allopurinol (ZYLOPRIM) 300 MG tablet Take 1 tablet (300 mg total) by mouth daily. 90 tablet 0  . ASPIR-LOW 81 MG EC tablet Take 1 tablet (81 mg total) by mouth daily. 90 tablet 3  . Blood Glucose Monitoring Suppl (ACCU-CHEK GUIDE) w/Device KIT 1 Units by Does not apply route as directed. 1 kit 0  . Cholecalciferol (VITAMIN D3) 2000 units TABS Take 1 tablet by mouth at bedtime.    . docusate sodium (COLACE) 100 MG capsule Take 100 mg by mouth daily as needed for mild constipation.     . empagliflozin (JARDIANCE) 10 MG TABS tablet Take 1 tablet (10 mg total) by mouth daily before breakfast. 90 tablet 1  . escitalopram (LEXAPRO) 10 MG tablet TAKE 1 TABLET EVERY DAY 90 tablet 3  . fluticasone (FLONASE) 50 MCG/ACT nasal spray SPRAY 2 SPRAYS INTO EACH NOSTRIL EVERY DAY 48 mL 0  . furosemide (LASIX) 40 MG tablet Take 1 tablet (40 mg total) by mouth daily. 30 tablet 3  . glucose blood (ACCU-CHEK GUIDE) test strip Check sugars three times daily and as needed when feeling ill E11.65, insulin use 100 each 11  . glucose blood test strip Use as instructed to check sugars three times daily E11.65, insulin use 100 each 12  . Insulin Pen Needle (B-D UF III MINI PEN NEEDLES) 31G X 5 MM MISC USE TO INJECT INSULIN DAILY 100 each 3  . loratadine (CLARITIN) 10 MG tablet Take 10 mg by mouth daily.    Marland Kitchen losartan (COZAAR) 50 MG tablet Take 1 tablet (50 mg total) by mouth daily. 30 tablet 1  . lovastatin (MEVACOR) 20 MG tablet Take 1 tablet (20 mg total) by mouth at bedtime. 90 tablet 3  . Magnesium 500 MG TABS Take 1 tablet (500 mg total) by mouth every Monday, Wednesday, and Friday. 30 tablet   . metFORMIN (GLUCOPHAGE-XR) 500 MG 24 hr tablet TAKE 1 TABLET BY MOUTH EVERY DAY WITH BREAKFAST 90 tablet 1  . Multiple Vitamins-Minerals (CVS SPECTRAVITE PO) Take 1 tablet by mouth at bedtime.    . mycophenolate (MYFORTIC) 360 MG TBEC EC tablet Take 360 mg by mouth 2 (two)  times daily.    . Omega-3 Fatty Acids (FISH OIL PO) Take 1 tablet by mouth at bedtime.    Marland Kitchen omeprazole (PRILOSEC) 40 MG capsule TAKE 1 CAPSULE EVERY DAY 90 capsule 3  . tacrolimus (PROGRAF) 1 MG capsule Take 1-2 mg by mouth See admin instructions. Take 2 mg by mouth in the morning, then take 1 mg  by mouth in the evening     No current facility-administered medications on file prior to visit.  Allergies  Allergen Reactions  . Tolmetin Rash    cirrhosis  . Ambien [Zolpidem Tartrate] Other (See Comments)    Over-toxicity from liver failure  . Morphine And Related     Hallucinations.  . Hydrochlorothiazide W-Triamterene Other (See Comments)    REACTION: dizzy, nausea  . Lisinopril Other (See Comments)    REACTION: cough, decreased libido    Family History  Problem Relation Age of Onset  . Stroke Mother   . Emphysema Mother   . Hypertension Father   . Heart disease Father   . Emphysema Father   . Lung cancer Maternal Aunt   . Stroke Paternal Grandmother   . Heart attack Neg Hx   . Diabetes Neg Hx     BP 114/70 (BP Location: Right Arm, Patient Position: Sitting, Cuff Size: Large)   Pulse 89   Ht 5' 6.5" (1.689 m)   Wt 269 lb 6.4 oz (122.2 kg)   SpO2 97%   BMI 42.83 kg/m    Review of Systems     Objective:   Physical Exam VITAL SIGNS:  See vs page GENERAL: no distress Pulses: dorsalis pedis intact bilat.   MSK: no deformity of the feet CV: no leg edema Skin:  no ulcer on the feet.  normal color and temp on the feet. Neuro: sensation is intact to touch on the feet.    A1c=9.2%    Assessment & Plan:  Insulin-requiring type 2 DM, with CRI: uncontrolled.    Patient Instructions  check your blood sugar twice a day.  vary the time of day when you check, between before the 3 meals, and at bedtime.  also check if you have symptoms of your blood sugar being too high or too low.  please keep a record of the readings and bring it to your next appointment here (or you  can bring the meter itself).  You can write it on any piece of paper.  please call us sooner if your blood sugar goes below 70, or if you have a lot of readings over 200.   I have sent a prescription to your pharmacy, to increase the Lantus to 100 units each morning, and you can stop taking the glimepiride.   Please come back for a follow-up appointment in 2 months.

## 2020-09-28 NOTE — Patient Instructions (Addendum)
check your blood sugar twice a day.  vary the time of day when you check, between before the 3 meals, and at bedtime.  also check if you have symptoms of your blood sugar being too high or too low.  please keep a record of the readings and bring it to your next appointment here (or you can bring the meter itself).  You can write it on any piece of paper.  please call us sooner if your blood sugar goes below 70, or if you have a lot of readings over 200.   I have sent a prescription to your pharmacy, to increase the Lantus to 100 units each morning, and you can stop taking the glimepiride.   Please come back for a follow-up appointment in 2 months.

## 2020-10-05 ENCOUNTER — Ambulatory Visit (HOSPITAL_COMMUNITY)
Admission: EM | Admit: 2020-10-05 | Discharge: 2020-10-05 | Disposition: A | Discharge status: Home / Self Care | Payer: Medicare HMO | Attending: Emergency Medicine | Admitting: Emergency Medicine

## 2020-10-05 ENCOUNTER — Encounter (HOSPITAL_COMMUNITY)
Admission: EM | Disposition: A | Discharge status: Home / Self Care | Payer: Self-pay | Source: Home / Self Care | Attending: Emergency Medicine

## 2020-10-05 ENCOUNTER — Encounter (HOSPITAL_COMMUNITY): Payer: Self-pay

## 2020-10-05 ENCOUNTER — Emergency Department (HOSPITAL_COMMUNITY): Payer: Medicare HMO

## 2020-10-05 ENCOUNTER — Emergency Department (HOSPITAL_COMMUNITY): Payer: Medicare HMO | Admitting: Anesthesiology

## 2020-10-05 DIAGNOSIS — Z20822 Contact with and (suspected) exposure to covid-19: Secondary | ICD-10-CM | POA: Insufficient documentation

## 2020-10-05 DIAGNOSIS — W312XXA Contact with powered woodworking and forming machines, initial encounter: Secondary | ICD-10-CM | POA: Insufficient documentation

## 2020-10-05 DIAGNOSIS — Z9989 Dependence on other enabling machines and devices: Secondary | ICD-10-CM | POA: Diagnosis not present

## 2020-10-05 DIAGNOSIS — Z944 Liver transplant status: Secondary | ICD-10-CM | POA: Insufficient documentation

## 2020-10-05 DIAGNOSIS — S62521B Displaced fracture of distal phalanx of right thumb, initial encounter for open fracture: Secondary | ICD-10-CM | POA: Diagnosis not present

## 2020-10-05 DIAGNOSIS — Z7982 Long term (current) use of aspirin: Secondary | ICD-10-CM | POA: Insufficient documentation

## 2020-10-05 DIAGNOSIS — Z89022 Acquired absence of left finger(s): Secondary | ICD-10-CM | POA: Diagnosis not present

## 2020-10-05 DIAGNOSIS — Z7984 Long term (current) use of oral hypoglycemic drugs: Secondary | ICD-10-CM | POA: Diagnosis not present

## 2020-10-05 DIAGNOSIS — S62521A Displaced fracture of distal phalanx of right thumb, initial encounter for closed fracture: Secondary | ICD-10-CM | POA: Diagnosis not present

## 2020-10-05 DIAGNOSIS — Z794 Long term (current) use of insulin: Secondary | ICD-10-CM | POA: Insufficient documentation

## 2020-10-05 DIAGNOSIS — S61112A Laceration without foreign body of left thumb with damage to nail, initial encounter: Secondary | ICD-10-CM | POA: Diagnosis not present

## 2020-10-05 DIAGNOSIS — Z23 Encounter for immunization: Secondary | ICD-10-CM | POA: Insufficient documentation

## 2020-10-05 DIAGNOSIS — S62609B Fracture of unspecified phalanx of unspecified finger, initial encounter for open fracture: Secondary | ICD-10-CM

## 2020-10-05 DIAGNOSIS — Z79899 Other long term (current) drug therapy: Secondary | ICD-10-CM | POA: Insufficient documentation

## 2020-10-05 DIAGNOSIS — G4733 Obstructive sleep apnea (adult) (pediatric): Secondary | ICD-10-CM | POA: Diagnosis not present

## 2020-10-05 DIAGNOSIS — S61319A Laceration without foreign body of unspecified finger with damage to nail, initial encounter: Secondary | ICD-10-CM

## 2020-10-05 DIAGNOSIS — I951 Orthostatic hypotension: Secondary | ICD-10-CM | POA: Diagnosis not present

## 2020-10-05 HISTORY — PX: I & D EXTREMITY: SHX5045

## 2020-10-05 LAB — POCT I-STAT, CHEM 8
BUN: 17 mg/dL (ref 6–20)
Calcium, Ion: 1.15 mmol/L (ref 1.15–1.40)
Chloride: 103 mmol/L (ref 98–111)
Creatinine, Ser: 1.3 mg/dL — ABNORMAL HIGH (ref 0.61–1.24)
Glucose, Bld: 124 mg/dL — ABNORMAL HIGH (ref 70–99)
HCT: 39 % (ref 39.0–52.0)
Hemoglobin: 13.3 g/dL (ref 13.0–17.0)
Potassium: 3.5 mmol/L (ref 3.5–5.1)
Sodium: 141 mmol/L (ref 135–145)
TCO2: 24 mmol/L (ref 22–32)

## 2020-10-05 LAB — RESP PANEL BY RT-PCR (FLU A&B, COVID) ARPGX2
Influenza A by PCR: NEGATIVE
Influenza B by PCR: NEGATIVE
SARS Coronavirus 2 by RT PCR: NEGATIVE

## 2020-10-05 LAB — CBG MONITORING, ED: Glucose-Capillary: 117 mg/dL — ABNORMAL HIGH (ref 70–99)

## 2020-10-05 SURGERY — IRRIGATION AND DEBRIDEMENT EXTREMITY
Anesthesia: General | Site: Thumb | Laterality: Right

## 2020-10-05 MED ORDER — CEFAZOLIN SODIUM-DEXTROSE 2-4 GM/100ML-% IV SOLN
2.0000 g | Freq: Once | INTRAVENOUS | Status: AC
  Administered 2020-10-05: 2 g via INTRAVENOUS
  Filled 2020-10-05 (×2): qty 100

## 2020-10-05 MED ORDER — 0.9 % SODIUM CHLORIDE (POUR BTL) OPTIME
TOPICAL | Status: DC | PRN
  Administered 2020-10-05: 1000 mL

## 2020-10-05 MED ORDER — PHENYLEPHRINE 40 MCG/ML (10ML) SYRINGE FOR IV PUSH (FOR BLOOD PRESSURE SUPPORT)
PREFILLED_SYRINGE | INTRAVENOUS | Status: AC
  Filled 2020-10-05: qty 10

## 2020-10-05 MED ORDER — LACTATED RINGERS IV SOLN: INTRAVENOUS | Status: DC

## 2020-10-05 MED ORDER — PROPOFOL 10 MG/ML IV BOLUS
INTRAVENOUS | Status: DC | PRN
  Administered 2020-10-05: 200 mg via INTRAVENOUS

## 2020-10-05 MED ORDER — ONDANSETRON HCL 4 MG/2ML IJ SOLN
INTRAMUSCULAR | Status: AC
  Filled 2020-10-05: qty 2

## 2020-10-05 MED ORDER — SUGAMMADEX SODIUM 200 MG/2ML IV SOLN
INTRAVENOUS | Status: DC | PRN
  Administered 2020-10-05: 200 mg via INTRAVENOUS

## 2020-10-05 MED ORDER — FENTANYL CITRATE (PF) 250 MCG/5ML IJ SOLN
INTRAMUSCULAR | Status: AC
  Filled 2020-10-05: qty 5

## 2020-10-05 MED ORDER — CHLORHEXIDINE GLUCONATE 0.12 % MT SOLN: 15.0000 mL | Freq: Once | OROMUCOSAL | Status: AC

## 2020-10-05 MED ORDER — MIDAZOLAM HCL 5 MG/5ML IJ SOLN
INTRAMUSCULAR | Status: DC | PRN
  Administered 2020-10-05: 1 mg via INTRAVENOUS

## 2020-10-05 MED ORDER — ROCURONIUM BROMIDE 10 MG/ML (PF) SYRINGE
PREFILLED_SYRINGE | INTRAVENOUS | Status: AC
  Filled 2020-10-05: qty 10

## 2020-10-05 MED ORDER — ROCURONIUM BROMIDE 10 MG/ML (PF) SYRINGE
PREFILLED_SYRINGE | INTRAVENOUS | Status: DC | PRN
  Administered 2020-10-05: 50 mg via INTRAVENOUS

## 2020-10-05 MED ORDER — ORAL CARE MOUTH RINSE: 15.0000 mL | Freq: Once | OROMUCOSAL | Status: AC

## 2020-10-05 MED ORDER — ONDANSETRON HCL 4 MG/2ML IJ SOLN
INTRAMUSCULAR | Status: DC | PRN
  Administered 2020-10-05: 4 mg via INTRAVENOUS

## 2020-10-05 MED ORDER — STERILE WATER FOR IRRIGATION IR SOLN
Status: DC | PRN
  Administered 2020-10-05: 400 mL

## 2020-10-05 MED ORDER — FENTANYL CITRATE (PF) 100 MCG/2ML IJ SOLN
INTRAMUSCULAR | Status: DC | PRN
  Administered 2020-10-05 (×2): 50 ug via INTRAVENOUS

## 2020-10-05 MED ORDER — DOXYCYCLINE HYCLATE 50 MG PO CAPS: 100.0000 mg | ORAL_CAPSULE | Freq: Two times a day (BID) | ORAL | 0 refills | Status: DC

## 2020-10-05 MED ORDER — BUPIVACAINE HCL (PF) 0.25 % IJ SOLN
INTRAMUSCULAR | Status: AC
  Filled 2020-10-05: qty 30

## 2020-10-05 MED ORDER — PHENYLEPHRINE 40 MCG/ML (10ML) SYRINGE FOR IV PUSH (FOR BLOOD PRESSURE SUPPORT)
PREFILLED_SYRINGE | INTRAVENOUS | Status: DC | PRN
  Administered 2020-10-05: 120 ug via INTRAVENOUS
  Administered 2020-10-05: 80 ug via INTRAVENOUS

## 2020-10-05 MED ORDER — TETANUS-DIPHTH-ACELL PERTUSSIS 5-2.5-18.5 LF-MCG/0.5 IM SUSY
0.5000 mL | PREFILLED_SYRINGE | Freq: Once | INTRAMUSCULAR | Status: AC
  Administered 2020-10-05: 0.5 mL via INTRAMUSCULAR
  Filled 2020-10-05: qty 0.5

## 2020-10-05 MED ORDER — LIDOCAINE 2% (20 MG/ML) 5 ML SYRINGE
INTRAMUSCULAR | Status: AC
  Filled 2020-10-05: qty 5

## 2020-10-05 MED ORDER — BUPIVACAINE HCL (PF) 0.25 % IJ SOLN
INTRAMUSCULAR | Status: DC | PRN
  Administered 2020-10-05: 30 mL

## 2020-10-05 MED ORDER — CHLORHEXIDINE GLUCONATE 0.12 % MT SOLN
OROMUCOSAL | Status: AC
  Administered 2020-10-05: 15 mL via OROMUCOSAL
  Filled 2020-10-05: qty 15

## 2020-10-05 MED ORDER — LIDOCAINE 2% (20 MG/ML) 5 ML SYRINGE
INTRAMUSCULAR | Status: DC | PRN
  Administered 2020-10-05: 100 mg via INTRAVENOUS

## 2020-10-05 MED ORDER — OXYCODONE HCL 5 MG PO TABS: ORAL_TABLET | ORAL | 0 refills | Status: DC

## 2020-10-05 MED ORDER — MIDAZOLAM HCL 2 MG/2ML IJ SOLN
INTRAMUSCULAR | Status: AC
  Filled 2020-10-05: qty 2

## 2020-10-05 SURGICAL SUPPLY — 63 items
ADAPTER CATH SYR TO TUBING 38M (ADAPTER) IMPLANT
BNDG COHESIVE 1X5 TAN STRL LF (GAUZE/BANDAGES/DRESSINGS) ×2 IMPLANT
BNDG COHESIVE 2X5 TAN STRL LF (GAUZE/BANDAGES/DRESSINGS) IMPLANT
BNDG CONFORM 2 STRL LF (GAUZE/BANDAGES/DRESSINGS) ×2 IMPLANT
BNDG ELASTIC 3X5.8 VLCR STR LF (GAUZE/BANDAGES/DRESSINGS) IMPLANT
BNDG ELASTIC 4X5.8 VLCR STR LF (GAUZE/BANDAGES/DRESSINGS) IMPLANT
BNDG ESMARK 4X9 LF (GAUZE/BANDAGES/DRESSINGS) ×2 IMPLANT
BNDG GAUZE ELAST 4 BULKY (GAUZE/BANDAGES/DRESSINGS) IMPLANT
CANISTER SUCT 3000ML PPV (MISCELLANEOUS) ×2 IMPLANT
CANNULA VESSEL 3MM 2 BLNT TIP (CANNULA) IMPLANT
CORD BIPOLAR FORCEPS 12FT (ELECTRODE) ×2 IMPLANT
COVER SURGICAL LIGHT HANDLE (MISCELLANEOUS) ×2 IMPLANT
COVER WAND RF STERILE (DRAPES) IMPLANT
CUFF TOURN SGL QUICK 18X4 (TOURNIQUET CUFF) IMPLANT
CUFF TOURN SGL QUICK 24 (TOURNIQUET CUFF)
CUFF TRNQT CYL 24X4X16.5-23 (TOURNIQUET CUFF) IMPLANT
DECANTER SPIKE VIAL GLASS SM (MISCELLANEOUS) IMPLANT
DRAIN PENROSE 1/4X12 LTX STRL (WOUND CARE) IMPLANT
DRAPE U-SHAPE 47X51 STRL (DRAPES) ×2 IMPLANT
DRSG PAD ABDOMINAL 8X10 ST (GAUZE/BANDAGES/DRESSINGS) IMPLANT
DRSG XEROFORM 1X8 (GAUZE/BANDAGES/DRESSINGS) ×2 IMPLANT
FACESHIELD WRAPAROUND (MASK) ×2 IMPLANT
GAUZE SPONGE 4X4 12PLY STRL (GAUZE/BANDAGES/DRESSINGS) IMPLANT
GAUZE SPONGE 4X4 12PLY STRL LF (GAUZE/BANDAGES/DRESSINGS) ×2 IMPLANT
GAUZE XEROFORM 1X8 LF (GAUZE/BANDAGES/DRESSINGS) IMPLANT
GLOVE BIO SURGEON STRL SZ7.5 (GLOVE) ×4 IMPLANT
GLOVE BIOGEL PI IND STRL 8.5 (GLOVE) IMPLANT
GLOVE BIOGEL PI INDICATOR 8.5 (GLOVE)
GLOVE BIOGEL PI ORTHO PRO SZ8 (GLOVE)
GLOVE PI ORTHO PRO STRL SZ8 (GLOVE) IMPLANT
GLOVE SRG 8 PF TXTR STRL LF DI (GLOVE) ×1 IMPLANT
GLOVE SURG ORTHO 8.0 STRL STRW (GLOVE) IMPLANT
GLOVE SURG UNDER POLY LF SZ8 (GLOVE) ×2
GOWN STRL REUS W/ TWL LRG LVL3 (GOWN DISPOSABLE) ×1 IMPLANT
GOWN STRL REUS W/ TWL XL LVL3 (GOWN DISPOSABLE) ×1 IMPLANT
GOWN STRL REUS W/TWL 2XL LVL3 (GOWN DISPOSABLE) ×2 IMPLANT
GOWN STRL REUS W/TWL LRG LVL3 (GOWN DISPOSABLE) ×2
GOWN STRL REUS W/TWL XL LVL3 (GOWN DISPOSABLE) ×2
KIT BASIN OR (CUSTOM PROCEDURE TRAY) ×2 IMPLANT
KIT TURNOVER KIT B (KITS) ×2 IMPLANT
LOOP VESSEL MAXI BLUE (MISCELLANEOUS) IMPLANT
MANIFOLD NEPTUNE II (INSTRUMENTS) IMPLANT
NEEDLE HYPO 25X1 1.5 SAFETY (NEEDLE) ×2 IMPLANT
NS IRRIG 1000ML POUR BTL (IV SOLUTION) ×2 IMPLANT
PACK ORTHO EXTREMITY (CUSTOM PROCEDURE TRAY) ×2 IMPLANT
PAD ARMBOARD 7.5X6 YLW CONV (MISCELLANEOUS) ×2 IMPLANT
SET CYSTO W/LG BORE CLAMP LF (SET/KITS/TRAYS/PACK) IMPLANT
SOL PREP POV-IOD 4OZ 10% (MISCELLANEOUS) ×2 IMPLANT
SOL PREP PROV IODINE SCRUB 4OZ (MISCELLANEOUS) ×2 IMPLANT
SPONGE LAP 4X18 RFD (DISPOSABLE) IMPLANT
SUT CHROMIC 6 0 PS 4 (SUTURE) ×2 IMPLANT
SUT ETHILON 4 0 P 3 18 (SUTURE) IMPLANT
SUT ETHILON 4 0 PS 2 18 (SUTURE) IMPLANT
SUT MON AB 5-0 P3 18 (SUTURE) ×2 IMPLANT
SWAB COLLECTION DEVICE MRSA (MISCELLANEOUS) IMPLANT
SWAB CULTURE ESWAB REG 1ML (MISCELLANEOUS) IMPLANT
SYR 20ML LL LF (SYRINGE) IMPLANT
SYR CONTROL 10ML LL (SYRINGE) ×2 IMPLANT
TOWEL GREEN STERILE (TOWEL DISPOSABLE) ×2 IMPLANT
TUBE CONNECTING 12X1/4 (SUCTIONS) ×2 IMPLANT
TUBE FEEDING ENTERAL 5FR 16IN (TUBING) IMPLANT
UNDERPAD 30X36 HEAVY ABSORB (UNDERPADS AND DIAPERS) ×2 IMPLANT
YANKAUER SUCT BULB TIP NO VENT (SUCTIONS) ×2 IMPLANT

## 2020-10-05 NOTE — ED Triage Notes (Signed)
Emergency Medicine Provider Triage Evaluation Note  Colton Porter , a 55 y.o. male  was evaluated in triage.  Pt complains of laceration to right thumb.  Occurred just PTA.  There is damage to nail bed.  Occurred on a table saw while at work.  Unsure last tetanus.  Denies decreased range of motion, active bleeding, drainage.  No anticoagulants  Review of Systems  Positive: Right thumb laceration Negative: Paresthesias, weakness, decreased range of motion  Physical Exam  BP (!) 163/93   Pulse (!) 107   Temp 98.8 F (37.1 C) (Oral)   Resp 14   SpO2 97%  Gen:   Awake, no distress   HEENT:  Atraumatic  Resp:  Normal effort  Cardiac:  Normal rate  Abd:   Nondistended, nontender  MSK:   Moves extremities without difficulty.  Laceration to distal right thumb.  Involves nailbed. Neuro:  Speech clear   Medical Decision Making  Medically screening exam initiated at 2:07 PM.  Appropriate orders placed.  [name] was informed that the remainder of the evaluation will be completed by another provider, this initial triage assessment does not replace that evaluation, and the importance of remaining in the ED until their evaluation is complete.  Clinical Impression  Right thumb laceration   Anistyn Graddy A, PA-C 10/05/20 1409

## 2020-10-05 NOTE — H&P (Signed)
Colton Porter is an 55 y.o. male.   Chief Complaint: saw injury HPI: 55 yo male states he injured right thumb on table saw earlier today.  Seen at [facility] ED where XR revealed bone loss at tuft of right thumb.  He reports no other injury at this time.  Has had previous injury to left hand.  Associated bleeding at wound right thumb.  Has had liver transplant.  Case discussed with [place] Robinson, [facility] and her note from 10/05/2020 reviewed. Xrays viewed and interpreted by me: 3 views right thumb show bone loss at tuft of distal phalanx Labs reviewed: none  Allergies:  Allergies  Allergen Reactions  . Tolmetin Rash    cirrhosis  . Ambien [Zolpidem Tartrate] Other (See Comments)    Over-toxicity from liver failure  . Morphine And Related     Hallucinations.  . Hydrochlorothiazide W-Triamterene Other (See Comments)    REACTION: dizzy, nausea  . Lisinopril Other (See Comments)    REACTION: cough, decreased libido    Past Medical History:  Diagnosis Date  . Alcohol dependence ([place]) 07/11/2012  . Alcoholic cirrhosis of liver with ascites ([place]) 07/2014   s./p transplant 12/2015  . Allergy   . Anemia   . Cellulitis of left leg   . Chronic diastolic heart failure ([place]) 11/01/2013  . CKD (chronic kidney disease) stage 3, GFR 30-59 ml/min ([facility]) 11/04/2015  . Diabetes mellitus without complication ([place])    Type II  . GERD (gastroesophageal reflux disease)   . Hyperlipidemia   . Hypertension   . Neuropathy   . OSA on CPAP 07/11/2012   HST 07/2013:  AHI 39/hr.    . Pneumonia due to COVID-19 virus 06/2019  . Thrombocytopenia ([place]) 12/15/2011    Past Surgical History:  Procedure Laterality Date  . COLONOSCOPY  08/2013   hyperplastic polyps, hemorrhoids Ardis Hughs)  . 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 FINGER AMPUTATION  1997   left 4th finger - radial arm saw  . LEFT AND RIGHT HEART CATHETERIZATION WITH CORONARY ANGIOGRAM N/A 06/10/2013    Procedure: LEFT AND RIGHT HEART CATHETERIZATION WITH CORONARY ANGIOGRAM;  Surgeon: Blane Ohara, MD;  Location: [facility] CATH LAB;  Service: Cardiovascular;  Laterality: N/A;  . LIVER TRANSPLANTATION  16/1096   alcoholic cirrhosis ([facility] at [facility])    Family History: Family History  Problem Relation Age of Onset  . Stroke Mother   . Emphysema Mother   . Hypertension Father   . Heart disease Father   . Emphysema Father   . Lung cancer Maternal Aunt   . Stroke Paternal Grandmother   . Heart attack Neg Hx   . Diabetes Neg Hx     Social History:   reports that he has never smoked. He has quit using smokeless tobacco.  His smokeless tobacco use included chew. He reports previous drug use. He reports that he does not drink alcohol.  Medications: Medications Prior to Admission  Medication Sig Dispense Refill  . acetaminophen (TYLENOL) 500 MG tablet Take 500 mg by mouth every 6 (six) hours as needed for mild pain.    Marland Kitchen allopurinol (ZYLOPRIM) 300 MG tablet Take 1 tablet (300 mg total) by mouth daily. 90 tablet 0  . ASPIR-LOW 81 MG EC tablet Take 1 tablet (81 mg total) by mouth daily. 90 tablet 3  . Cholecalciferol (VITAMIN D3) 2000 units TABS Take 1 tablet by mouth at bedtime.    . docusate sodium (COLACE) 100 MG capsule  Take 100 mg by mouth daily as needed for mild constipation.     . empagliflozin (JARDIANCE) 10 MG TABS tablet Take 1 tablet (10 mg total) by mouth daily before breakfast. 90 tablet 1  . escitalopram (LEXAPRO) 10 MG tablet TAKE 1 TABLET EVERY DAY 90 tablet 3  . furosemide (LASIX) 40 MG tablet Take 1 tablet (40 mg total) by mouth daily. 30 tablet 3  . insulin glargine (LANTUS SOLOSTAR) 100 UNIT/ML Solostar Pen Inject 100 Units into the skin every morning. And pen needles 1/day 105 mL 3  . loratadine (CLARITIN) 10 MG tablet Take 10 mg by mouth daily.    Marland Kitchen losartan (COZAAR) 50 MG tablet Take 1 tablet (50 mg total) by mouth daily. 30 tablet 1  . lovastatin (MEVACOR) 20 MG  tablet Take 1 tablet (20 mg total) by mouth at bedtime. 90 tablet 3  . Magnesium 500 MG TABS Take 1 tablet (500 mg total) by mouth every Monday, Wednesday, and Friday. 30 tablet   . metFORMIN (GLUCOPHAGE-XR) 500 MG 24 hr tablet TAKE 1 TABLET BY MOUTH EVERY DAY WITH BREAKFAST 90 tablet 1  . Multiple Vitamins-Minerals ([employer] SPECTRAVITE PO) Take 1 tablet by mouth at bedtime.    . mycophenolate (MYFORTIC) 360 MG TBEC EC tablet Take 360 mg by mouth 2 (two) times daily.    . Omega-3 Fatty Acids (FISH OIL PO) Take 1 tablet by mouth at bedtime.    Marland Kitchen omeprazole (PRILOSEC) 40 MG capsule TAKE 1 CAPSULE EVERY DAY 90 capsule 3  . tacrolimus (PROGRAF) 1 MG capsule Take 1-2 mg by mouth See admin instructions. Take 2 mg by mouth in the morning, then take 1 mg  by mouth in the evening    . Accu-Chek FastClix Lancets MISC Use three times daily to check sugars E65.11, insulin use 100 each 12  . Blood Glucose Monitoring Suppl (ACCU-CHEK GUIDE) w/Device KIT 1 Units by Does not apply route as directed. 1 kit 0  . fluticasone (FLONASE) 50 MCG/ACT nasal spray SPRAY 2 SPRAYS INTO EACH NOSTRIL EVERY DAY 48 mL 0  . glucose blood (ACCU-CHEK GUIDE) test strip Check sugars three times daily and as needed when feeling ill E11.65, insulin use 100 each 11  . glucose blood test strip Use as instructed to check sugars three times daily E11.65, insulin use 100 each 12  . Insulin Pen Needle (B-D UF III MINI PEN NEEDLES) 31G X 5 MM MISC USE TO INJECT INSULIN DAILY 100 each 3    Results for orders placed or performed during the hospital encounter of 10/05/20 (from the past 48 hour(s))  Resp Panel by RT-PCR (Flu A&B, Covid) Nasopharyngeal Swab     Status: None   Collection Time: 10/05/20  5:00 PM   Specimen: Nasopharyngeal Swab; Nasopharyngeal(NP) swabs in vial transport medium  Result Value Ref Range   SARS Coronavirus 2 by RT PCR NEGATIVE NEGATIVE    Comment: (NOTE) SARS-CoV-2 target nucleic acids are NOT DETECTED.  The  SARS-CoV-2 RNA is generally detectable in upper respiratory specimens during the acute phase of infection. The lowest concentration of SARS-CoV-2 viral copies this assay can detect is 138 copies/mL. A negative result does not preclude SARS-Cov-2 infection and should not be used as the sole basis for treatment or other patient management decisions. A negative result may occur with  improper specimen collection/handling, submission of specimen other than nasopharyngeal swab, presence of viral mutation(s) within the areas targeted by this assay, and inadequate number of viral copies(<138 copies/mL). A  negative result must be combined with clinical observations, patient history, and epidemiological information. The expected result is Negative.  Fact Sheet for Patients:  EntrepreneurPulse.com.au  Fact Sheet for Healthcare Providers:  IncredibleEmployment.be  This test is no t yet approved or cleared by the [place] FDA and  has been authorized for detection and/or diagnosis of SARS-CoV-2 by FDA under an Emergency Use Authorization (EUA). This EUA will remain  in effect (meaning this test can be used) for the duration of the COVID-19 declaration under Section 564(b)(1) of the Act, 21 U.S.C.section 360bbb-3(b)(1), unless the authorization is terminated  or revoked sooner.       Influenza A by PCR NEGATIVE NEGATIVE   Influenza B by PCR NEGATIVE NEGATIVE    Comment: (NOTE) The Xpert Xpress SARS-CoV-2/FLU/RSV plus assay is intended as an aid in the diagnosis of influenza from Nasopharyngeal swab specimens and should not be used as a sole basis for treatment. Nasal washings and aspirates are unacceptable for Xpert Xpress SARS-CoV-2/FLU/RSV testing.  Fact Sheet for Patients: EntrepreneurPulse.com.au  Fact Sheet for Healthcare Providers: IncredibleEmployment.be  This test is not yet approved or cleared by the  [place] FDA and has been authorized for detection and/or diagnosis of SARS-CoV-2 by FDA under an Emergency Use Authorization (EUA). This EUA will remain in effect (meaning this test can be used) for the duration of the COVID-19 declaration under Section 564(b)(1) of the Act, 21 U.S.C. section 360bbb-3(b)(1), unless the authorization is terminated or revoked.  Performed at [facility], [place] [address], [place], [place] [address]   CBG monitoring, ED     Status: Abnormal   Collection Time: 10/05/20  7:18 PM  Result Value Ref Range   Glucose-Capillary 117 (H) 70 - 99 mg/dL    Comment: Glucose reference range applies only to samples taken after fasting for at least 8 hours.    DG Finger Thumb Right  Result Date: 10/05/2020 CLINICAL DATA:  Right thumb table saw injury. EXAM: RIGHT THUMB 2+V COMPARISON:  None. FINDINGS: Tiny acute fracture at the tip of the first distal phalanx with overlying soft tissue injury. No radiopaque foreign body identified. IMPRESSION: 1. Tiny acute fracture at the tip of the first distal phalanx. Electronically Signed   By: Titus Dubin M.D.   On: 10/05/2020 14:46     A comprehensive review of systems was negative. Review of Systems: No fevers, chills, night sweats, chest pain, shortness of breath, nausea, vomiting, diarrhea, constipation, easy bleeding or bruising, headaches, dizziness, vision changes, fainting.   Blood pressure 136/86, pulse 88, temperature 98 F (36.7 C), temperature source Oral, resp. rate 18, SpO2 96 %.  General appearance: alert, cooperative and appears stated age Head: Normocephalic, without obvious abnormality, atraumatic Neck: supple, symmetrical, trachea midline Resp: clear to auscultation bilaterally Cardio: regular rate and rhythm Extremities: Intact sensation and capillary refill all digits.  +epl/fpl/io. Laceration right thumb into nail.  Laceration in pad of thumb. Pulses: 2+ and symmetric Skin: Skin color,  texture, turgor normal. No rashes or lesions Neurologic: Grossly normal Incision/Wound: as above  Assessment/Plan Right thumb saw injury with tuft fracture and skin and nail bed laceration.  Recommend OR for irrigation and debridement and repair skin and nail bed.  Risks, benefits and alternatives of surgery were discussed including risks of blood loss, infection, damage to nerves/vessels/tendons/ligament/bone, failure of surgery, need for additional surgery, complication with wound healing, stiffness.  He voiced understanding of these risks and elected to proceed.    Leanora Cover 10/05/2020,  7:49 PM

## 2020-10-05 NOTE — Discharge Instructions (Addendum)

## 2020-10-05 NOTE — Transfer of Care (Signed)
Immediate Anesthesia Transfer of Care Note  Patient: Colton Porter  Procedure(s) Performed: IRRIGATION AND DEBRIDEMENT THUMB (Right )  Patient Location: PACU  Anesthesia Type:General  Level of Consciousness: awake, alert  and oriented  Airway & Oxygen Therapy: Patient Spontanous Breathing and Patient connected to face mask oxygen  Post-op Assessment: Report given to RN and Post -op Vital signs reviewed and stable  Post vital signs: Reviewed and stable  Last Vitals:  Vitals Value Taken Time  BP 140/67 10/05/20 2058  Temp 36.4 C 10/05/20 2058  Pulse 92 10/05/20 2103  Resp 23 10/05/20 2103  SpO2 100 % 10/05/20 2103  Vitals shown include unvalidated device data.  Last Pain:  Vitals:   10/05/20 2058  TempSrc:   PainSc: 0-No pain      Patients Stated Pain Goal: 2 (20/03/79 4446)  Complications: No complications documented.

## 2020-10-05 NOTE — Op Note (Addendum)
NAME: MALE MINISH MEDICAL RECORD NO: [id number] DATE OF BIRTH: [birth year] FACILITY: [facility] LOCATION: [facility] OR PHYSICIAN: Tennis Must, MD   OPERATIVE REPORT   DATE OF PROCEDURE: 10/05/20    PREOPERATIVE DIAGNOSIS:   Right thumb open distal phalanx fracture and skin and nailbed laceration   POSTOPERATIVE DIAGNOSIS:   Right thumb open distal phalanx fracture and skin and nailbed laceration   PROCEDURE:   1.  Irrigation debridement of right thumb open distal phalanx fracture 2.  Repair of right thumb skin and nailbed laceration   SURGEON:  Leanora Cover, M.D.   ASSISTANT: none   ANESTHESIA:  General   INTRAVENOUS FLUIDS:  Per anesthesia flow sheet.   ESTIMATED BLOOD LOSS:  Minimal.   COMPLICATIONS:  None.   SPECIMENS:  none   TOURNIQUET TIME:   Right arm: Approximately 20 minutes at 250 mmHg   DISPOSITION:  Stable to PACU.  Debridement type: Excisional Debridement  Side: right  Body Location: thumb   Tools used for debridement: scalpel  Debridement depth beyond dead/damaged tissue down to healthy viable tissue: yes  Tissue layer involved: skin, subcutaneous tissue, muscle / fascia  Nature of tissue removed: Devitalized Tissue  Irrigation fluid type: Normal Saline     INDICATIONS: 55 year old male states he injured his right thumb on a table saw earlier today.  He was seen in the [facility] emergency department where radiographs were taken revealing bone loss at the distal tuft of the thumb.  Recommended irrigation debridement in the operating room with repair of skin and nailbed lacerations. Risks, benefits and alternatives of surgery were discussed including the risks of blood loss, infection, damage to nerves, vessels, tendons, ligaments, bone for surgery, need for additional surgery, complications with wound healing, continued pain, stiffness.  He voiced understanding of these risks and elected to proceed.  OPERATIVE COURSE:  After being identified  preoperatively by myself,  the patient and I agreed on the procedure and site of the procedure.  The surgical site was marked.  Surgical consent had been signed. He was given IV antibiotics as preoperative antibiotic prophylaxis. He was transferred to the operating room and placed on the operating table in supine position with the Right upper extremity on an arm board.  General anesthesia was induced by the anesthesiologist.  Right upper extremity was prepped and draped in normal sterile orthopedic fashion.  A surgical pause was performed between the surgeons, anesthesia, and operating room staff and all were in agreement as to the patient, procedure, and site of procedure.  Tourniquet at the proximal aspect of the extremity was inflated to 250 mmHg after exsanguination of the arm with an Esmarch bandage.    Wound was explored.  There is no gross contamination.  A knife was used to sharply debride and remove any devitalized tissue including skin subcutaneous tissues and tissue adjacent to the bone.  The laceration went through the pad of the finger and into the distal aspect of the nailbed.  The nail was removed with the freer elevator.  The wound was copiously irrigated with sterile saline by bulb syringe.  The skin was then repaired with a 5-0 Monocryl suture in an interrupted fashion.  Good reapproximation of skin edges was obtained.  The nailbed was then repaired with a 6-0 chromic suture in an interrupted fashion.  Good reapproximation of soft tissues was obtained.  A digital block was performed with quarter percent plain Marcaine to aid in postoperative analgesia.  A piece of Xeroform was  placed in the nail fold and the wounds dressed with sterile Xeroform 4 x 4 and wrapped with a Coban dressing lightly.  An AlumaFoam splint was placed and wrapped lightly with Coban dressing.  The tourniquet was deflated at 20 minutes.  Fingertips were pink with brisk capillary refill after deflation of tourniquet.  The  operative  drapes were broken down.  The patient was awoken from anesthesia safely.  He was transferred back to the stretcher and taken to PACU in stable condition.  I will see him back in the office in 1 week for postoperative followup.  I will give him a prescription for oxycodone 5 mg 1 p.o. every 6 hours as needed pain dispense #12 and doxycycline 100 mg p.o. twice daily x7 days.   Leanora Cover, MD Electronically signed, 10/05/20   Addendum (10/14/20): Clarify debridement.

## 2020-10-05 NOTE — Anesthesia Preprocedure Evaluation (Addendum)
Anesthesia Evaluation  Patient identified by MRN, date of birth, ID band Patient awake    Reviewed: Allergy & Precautions, NPO status , Patient's Chart, lab work & pertinent test results  Airway Mallampati: II  TM Distance: >3 FB Neck ROM: Full    Dental no notable dental hx. (+) Dental Advisory Given, Teeth Intact   Pulmonary shortness of breath, sleep apnea , pneumonia,    Pulmonary exam normal breath sounds clear to auscultation       Cardiovascular hypertension, Pt. on medications Normal cardiovascular exam Rhythm:Regular Rate:Normal  Echo 07/31/2019 1. Left ventricular ejection fraction, by visual estimation, is 65 to 70%. The left ventricle has hyperdynamic function. There is moderately increased left ventricular hypertrophy.  2. Left ventricular diastolic parameters are consistent with Grade I diastolic dysfunction (impaired relaxation).  3. The left ventricle has no regional wall motion abnormalities.  4. Global right ventricle has normal systolic function.The right ventricular size is normal. Mildly increased right ventricular wall thickness.  5. Left atrial size was normal.  6. Right atrial size was normal.  7. The mitral valve is grossly normal. No evidence of mitral valve regurgitation.  8. The tricuspid valve is grossly normal.  9. The tricuspid valve is grossly normal. Tricuspid valve regurgitation is trivial.  10. The aortic valve is grossly normal. Aortic valve regurgitation is not visualized.  11. The pulmonic valve was grossly normal. Pulmonic valve regurgitation is not visualized.  12. The interatrial septum was not well visualized.  13. Very poor acoustic windows due to patient's body habitus. LV and RV function are grossly normal. No obvious valvular abnormalities noted.    Neuro/Psych  Headaches, PSYCHIATRIC DISORDERS Depression  Neuromuscular disease    GI/Hepatic GERD  ,(+) Cirrhosis     substance  abuse  alcohol use,   Endo/Other  diabetes  Renal/GU Renal disease     Musculoskeletal negative musculoskeletal ROS (+)   Abdominal (+) + obese,   Peds  Hematology  (+) Blood dyscrasia, anemia ,   Anesthesia Other Findings   Reproductive/Obstetrics                           Anesthesia Physical Anesthesia Plan  ASA: III  Anesthesia Plan: General   Post-op Pain Management:    Induction: Intravenous  PONV Risk Score and Plan: 3 and Ondansetron, Dexamethasone, Treatment may vary due to age or medical condition and Midazolam  Airway Management Planned: Oral ETT  Additional Equipment:   Intra-op Plan:   Post-operative Plan: Extubation in OR  Informed Consent: I have reviewed the patients History and Physical, chart, labs and discussed the procedure including the risks, benefits and alternatives for the proposed anesthesia with the patient or authorized representative who has indicated his/her understanding and acceptance.     Dental advisory given  Plan Discussed with: CRNA  Anesthesia Plan Comments:        Anesthesia Quick Evaluation

## 2020-10-05 NOTE — ED Provider Notes (Signed)
[facility] EMERGENCY DEPARTMENT Provider Note   CSN: [id number] Arrival date & time: 10/05/20  1357     History Chief Complaint  Patient presents with  . Hand Injury    Colton Porter is a 55 y.o. male past medical history of liver transplant, CKD, CHF, diabetes, OSA, hypertension.  He is presenting to the ED for evaluation of right thumb injury that occurred prior to arrival.  He states he was using a table saw while at work.  He lacerated his thumb.  Is not sure if his last Tdap was updated within the last 5 years.  He denies any numbness to the digit or active bleeding.  Pain is minimal.  Right-hand-dominant.   The history is provided by the patient.       Past Medical History:  Diagnosis Date  . Alcohol dependence ([place]) 07/11/2012  . Alcoholic cirrhosis of liver with ascites ([place]) 07/2014   s./p transplant 12/2015  . Allergy   . Anemia   . Cellulitis of left leg   . Chronic diastolic heart failure ([place]) 11/01/2013  . CKD (chronic kidney disease) stage 3, GFR 30-59 ml/min (HCC) 11/04/2015  . Diabetes mellitus without complication ([place])   . GERD (gastroesophageal reflux disease)   . Hyperlipidemia   . Hypertension   . Neuropathy   . OSA on CPAP 07/11/2012   HST 07/2013:  AHI 39/hr.    . Pneumonia due to COVID-19 virus 06/2019  . Thrombocytopenia ([place]) 12/15/2011    Patient Active Problem List   Diagnosis Date Noted  . Chronic right-sided thoracic back pain 04/07/2020  . Memory loss 04/07/2020  . Perianal pruritus 02/06/2020  . Elevated PSA 02/06/2020  . Advanced directives, counseling/discussion 02/06/2020  . Acute pain of left foot 01/01/2020  . Pain, dental 08/21/2019  . Orthostatic hypotension 07/31/2019  . Syncope 07/30/2019  . Left rib fracture 07/27/2019  . Pneumonia due to COVID-19 virus 07/18/2019  . [place] lesion, left 02/05/2018  . Chronic heel pain, left 10/14/2017  . Anejaculation 08/09/2017  . Chronic gout 05/22/2017  . Dyspnea  11/18/2016  . BPH (benign prostatic hyperplasia) 10/05/2016  . Status post liver transplant ([place]) 03/15/2016  . Portal hypertensive gastropathy ([place]) 12/04/2015  . Type 2 diabetes mellitus with other specified complication ([place]) 50/93/2671  . Gynecomastia   . CKD (chronic kidney disease) stage 3, GFR 30-59 ml/min (HCC) 11/04/2015  . Depression, major, single episode, moderate ([place]) 07/09/2015  . Headache 02/19/2015  . Insomnia 12/06/2014  . Hypomagnesemia   . Other fatigue 11/18/2014  . Internal hemorrhoid 10/01/2014  . Erectile dysfunction 10/01/2014  . Alcoholic cirrhosis of liver without ascites ([place]) 07/28/2014  . (HFpEF) heart failure with preserved ejection fraction ([place]) 11/01/2013  . Essential hypertension 06/04/2013  . OSA on CPAP 07/11/2012  . History of alcohol dependence ([place]) 07/11/2012  . Thrombocytopenia ([place]) 12/15/2011  . Morbid obesity with BMI of 40.0-44.9, adult ([place]) 11/16/2011  . Routine general medical examination at a health care facility 09/22/2010  . NEUROPATHY 05/27/2009  . Pedal edema 09/22/2008  . Dyslipidemia associated with type 2 diabetes mellitus ([place]) 02/15/2007  . Allergic rhinitis 02/15/2007  . GERD 02/15/2007    Past Surgical History:  Procedure Laterality Date  . COLONOSCOPY  08/2013   hyperplastic polyps, hemorrhoids Ardis Hughs)  . 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 FINGER AMPUTATION  1997   left 4th finger - radial arm saw  . LEFT AND RIGHT HEART CATHETERIZATION WITH  CORONARY ANGIOGRAM N/A 06/10/2013   Procedure: LEFT AND RIGHT HEART CATHETERIZATION WITH CORONARY ANGIOGRAM;  Surgeon: Micheline Chapman, MD;  Location: [facility] CATH LAB;  Service: Cardiovascular;  Laterality: N/A;  . LIVER TRANSPLANTATION  12/2015   alcoholic cirrhosis ([place] [facility])       Family History  Problem Relation Age of Onset  . Stroke Mother   . Emphysema Mother   . Hypertension Father   . Heart disease Father    . Emphysema Father   . Lung cancer Maternal Aunt   . Stroke Paternal Grandmother   . Heart attack Neg Hx   . Diabetes Neg Hx     Social History   Tobacco Use  . Smoking status: Never Smoker  . Smokeless tobacco: Former Neurosurgeon    Types: Engineer, drilling  . Vaping Use: Never used  Substance Use Topics  . Alcohol use: No    Alcohol/week: 0.0 standard drinks    Comment: 4-6 drinks daily - NO ETOH SINCE APRIL 2016  . Drug use: Not Currently    Comment: remote use of marijuana in the past, has since quit    Home Medications Prior to Admission medications   Medication Sig Start Date End Date Taking? Authorizing Provider  Accu-Chek FastClix Lancets MISC Use three times daily to check sugars E65.11, insulin use 08/25/19   Eustaquio Boyden, MD  acetaminophen (TYLENOL) 500 MG tablet Take 500 mg by mouth every 6 (six) hours as needed for mild pain.    [provider]  allopurinol (ZYLOPRIM) 300 MG tablet Take 1 tablet (300 mg total) by mouth daily. 04/13/20   Eustaquio Boyden, MD  ASPIR-LOW 81 MG EC tablet Take 1 tablet (81 mg total) by mouth daily. 04/06/17   Eustaquio Boyden, MD  Blood Glucose Monitoring Suppl (ACCU-CHEK GUIDE) w/Device KIT 1 Units by Does not apply route as directed. 06/29/17   Eustaquio Boyden, MD  Cholecalciferol (VITAMIN D3) 2000 units TABS Take 1 tablet by mouth at bedtime.    [provider]  docusate sodium (COLACE) 100 MG capsule Take 100 mg by mouth daily as needed for mild constipation.     [provider]  empagliflozin (JARDIANCE) 10 MG TABS tablet Take 1 tablet (10 mg total) by mouth daily before breakfast. 06/08/20   Lars Masson, MD  escitalopram (LEXAPRO) 10 MG tablet TAKE 1 TABLET EVERY DAY 04/15/20   Eustaquio Boyden, MD  fluticasone [facility]) 50 MCG/ACT nasal spray SPRAY 2 SPRAYS INTO EACH NOSTRIL EVERY DAY 12/24/19   Eustaquio Boyden, MD  furosemide (LASIX) 40 MG tablet Take 1 tablet (40 mg total) by mouth daily.  02/06/20   Eustaquio Boyden, MD  glucose blood (ACCU-CHEK GUIDE) test strip Check sugars three times daily and as needed when feeling ill E11.65, insulin use 06/29/17   Eustaquio Boyden, MD  glucose blood test strip Use as instructed to check sugars three times daily E11.65, insulin use 08/25/19   Eustaquio Boyden, MD  insulin glargine (LANTUS SOLOSTAR) 100 UNIT/ML Solostar Pen Inject 100 Units into the skin every morning. And pen needles 1/day 09/28/20   Romero Belling, MD  Insulin Pen Needle (B-D UF III MINI PEN NEEDLES) 31G X 5 MM MISC USE TO INJECT INSULIN DAILY 09/16/19   Eustaquio Boyden, MD  loratadine (CLARITIN) 10 MG tablet Take 10 mg by mouth daily.    [provider]  losartan (COZAAR) 50 MG tablet Take 1 tablet (50 mg total) by mouth daily. 04/13/20   Eustaquio Boyden,  MD  lovastatin (MEVACOR) 20 MG tablet Take 1 tablet (20 mg total) by mouth at bedtime. 02/06/20   Eustaquio Boyden, MD  Magnesium 500 MG TABS Take 1 tablet (500 mg total) by mouth every Monday, Wednesday, and Friday. 02/19/20   Eustaquio Boyden, MD  metFORMIN (GLUCOPHAGE-XR) 500 MG 24 hr tablet TAKE 1 TABLET BY MOUTH EVERY DAY WITH BREAKFAST 07/20/20   Eustaquio Boyden, MD  Multiple Vitamins-Minerals ([employer] SPECTRAVITE PO) Take 1 tablet by mouth at bedtime.    [provider]  mycophenolate (MYFORTIC) 360 MG TBEC EC tablet Take 360 mg by mouth 2 (two) times daily. 10/15/19   [provider]  Omega-3 Fatty Acids (FISH OIL PO) Take 1 tablet by mouth at bedtime.    [provider]  omeprazole (PRILOSEC) 40 MG capsule TAKE 1 CAPSULE EVERY DAY 04/15/20   Eustaquio Boyden, MD  tacrolimus (PROGRAF) 1 MG capsule Take 1-2 mg by mouth See admin instructions. Take 2 mg by mouth in the morning, then take 1 mg  by mouth in the evening    [provider]    Allergies    Tolmetin, Ambien [zolpidem tartrate], Morphine and related, Hydrochlorothiazide w-triamterene, and Lisinopril  Review of  Systems   Review of Systems  Musculoskeletal: Positive for myalgias.  Skin: Positive for wound.  Neurological: Negative for numbness.  All other systems reviewed and are negative.   Physical Exam Updated Vital Signs BP 136/86 (BP Location: Right Arm)   Pulse 88   Temp 98 F (36.7 C) (Oral)   Resp 18   SpO2 96%   Physical Exam Vitals and nursing note reviewed.  Constitutional:      Appearance: He is well-developed.  HENT:     Head: Normocephalic and atraumatic.  Eyes:     Conjunctiva/sclera: Conjunctivae normal.  Cardiovascular:     Rate and Rhythm: Normal rate.  Pulmonary:     Effort: Pulmonary effort is normal.  Musculoskeletal:     Comments: Right thumb with laceration to distal phalanx involving the radial aspect of the distal nail and extending to pad of the digit. Images below. Mildly dec sensation to radial aspect, distal most digit. Normal to ulnar aspect of digit. Strong radial pulse  Neurological:     Mental Status: He is alert.  Psychiatric:        Mood and Affect: Mood normal.        Behavior: Behavior normal.         ED Results / Procedures / Treatments   Labs (all labs ordered are listed, but only abnormal results are displayed) Labs Reviewed  RESP PANEL BY RT-PCR (FLU A&B, COVID) [id number]    EKG None  Radiology DG Finger Thumb Right  Result Date: 10/05/2020 CLINICAL DATA:  Right thumb table saw injury. EXAM: RIGHT THUMB 2+V COMPARISON:  None. FINDINGS: Tiny acute fracture at the tip of the first distal phalanx with overlying soft tissue injury. No radiopaque foreign body identified. IMPRESSION: 1. Tiny acute fracture at the tip of the first distal phalanx. Electronically Signed   By: Obie Dredge M.D.   On: 10/05/2020 14:46    Procedures Procedures   Medications Ordered in ED Medications  ceFAZolin (ANCEF) IVPB 2g/100 mL premix (0 g Intravenous Stopped 10/05/20 1905)  Tdap (BOOSTRIX) injection 0.5 mL (0.5 mLs Intramuscular Given 10/05/20  1746)    ED Course  I have reviewed the triage vital signs and the nursing notes.  Pertinent labs & imaging results that were available during my  care of the patient were reviewed by me and considered in my medical decision making (see chart for details).    MDM Rules/Calculators/A&P                          Patient with complicated laceration of the distal right thumb that occurred while using it table saw at work today.  The laceration involves the nailbed, x-ray with small fracture to the distal phalanx of that digit as well.  Overall good neurovascular status.  Tdap updated, Ancef given.  Consulted with hand specialist Dr. Fredna Dow, who will bring to OR for irrigation and repair.    Patient's pain is minimal on examination, declines any medication management for pain at this time.  N.p.o. since 11:30 AM, Covid swab processing. Final Clinical Impression(s) / ED Diagnoses Final diagnoses:  Laceration of finger nail bed, initial encounter  Phalanx, hand fracture, open, initial encounter    Rx / DC Orders ED Discharge Orders    None       Zarya Lasseigne, [place] N, PA-C 10/05/20 Einar Crow    Lacretia Leigh, MD 10/08/20 1110

## 2020-10-05 NOTE — Anesthesia Procedure Notes (Signed)
Procedure Name: Intubation Date/Time: 10/05/2020 8:09 PM Performed by: Alain Marion, CRNA Pre-anesthesia Checklist: Patient identified, Emergency Drugs available, Suction available and Patient being monitored Patient Re-evaluated:Patient Re-evaluated prior to induction Oxygen Delivery Method: Circle System Utilized Preoxygenation: Pre-oxygenation with 100% oxygen Induction Type: IV induction Ventilation: Mask ventilation without difficulty Laryngoscope Size: Miller and 2 Grade View: Grade II Tube type: Oral Tube size: 7.5 mm Number of attempts: 1 Airway Equipment and Method: Stylet Placement Confirmation: ETT inserted through vocal cords under direct vision,  positive ETCO2 and breath sounds checked- equal and bilateral Secured at: 21 cm Tube secured with: Tape Dental Injury: Teeth and Oropharynx as per pre-operative assessment

## 2020-10-05 NOTE — ED Triage Notes (Signed)
Pt reports he is here today due to thumb amp. Pt reports he was using a saw and cut the tip of his finger. On left hand

## 2020-10-06 ENCOUNTER — Encounter (HOSPITAL_COMMUNITY): Payer: Self-pay | Admitting: Orthopedic Surgery

## 2020-10-06 LAB — GLUCOSE, CAPILLARY: Glucose-Capillary: 131 mg/dL — ABNORMAL HIGH (ref 70–99)

## 2020-10-06 NOTE — Anesthesia Postprocedure Evaluation (Signed)
Anesthesia Post Note  Patient: Colton Porter  Procedure(s) Performed: 1.  Irrigation debridement of right thumb open distal phalanx fracture 2.  Repair of right thumb skin and nailbed laceration  (Right Thumb)     Patient location during evaluation: PACU Anesthesia Type: General Level of consciousness: sedated and patient cooperative Pain management: pain level controlled Vital Signs Assessment: post-procedure vital signs reviewed and stable Respiratory status: spontaneous breathing Cardiovascular status: stable Anesthetic complications: no   No complications documented.  Last Vitals:  Vitals:   10/05/20 2113 10/05/20 2115  BP: 133/71   Pulse: 89   Resp: 19   Temp:  36.7 C  SpO2: 100%     Last Pain:  Vitals:   10/05/20 2115  TempSrc:   PainSc: 0-No pain                 Nolon Nations

## 2020-10-07 ENCOUNTER — Telehealth: Payer: Self-pay

## 2020-10-07 NOTE — Chronic Care Management (AMB) (Addendum)
Chronic Care Management Pharmacy Assistant   Name: Colton Porter  MRN: [id number] DOB: 1965/11/06   Reason for Encounter: Disease State/ Diabetes   Conditions to be addressed/monitored: DMII  Recent office visits: None since last CCM visit   Recent consult visits: 09/28/20 Dr. Everardo All- Endocrinology - Discontinued glimepiride and increased Insulin to 100 units.   Hospital/ED visits: Patient was seen in ER for thumb laceration on 10/05/2020.  Medications: Outpatient Encounter Medications as of 10/07/2020  Medication Sig   Accu-Chek FastClix Lancets MISC Use three times daily to check sugars E65.11, insulin use   acetaminophen (TYLENOL) 500 MG tablet Take 500 mg by mouth every 6 (six) hours as needed for mild pain.   allopurinol (ZYLOPRIM) 300 MG tablet Take 1 tablet (300 mg total) by mouth daily.   ASPIR-LOW 81 MG EC tablet Take 1 tablet (81 mg total) by mouth daily.   Blood Glucose Monitoring Suppl (ACCU-CHEK GUIDE) w/Device KIT 1 Units by Does not apply route as directed.   Cholecalciferol (VITAMIN D3) 2000 units TABS Take 1 tablet by mouth at bedtime.   docusate sodium (COLACE) 100 MG capsule Take 100 mg by mouth daily as needed for mild constipation.    doxycycline (VIBRAMYCIN) 50 MG capsule Take 2 capsules (100 mg total) by mouth 2 (two) times daily.   empagliflozin (JARDIANCE) 10 MG TABS tablet Take 1 tablet (10 mg total) by mouth daily before breakfast.   escitalopram (LEXAPRO) 10 MG tablet TAKE 1 TABLET EVERY DAY   fluticasone (FLONASE) 50 MCG/ACT nasal spray SPRAY 2 SPRAYS INTO EACH NOSTRIL EVERY DAY   furosemide (LASIX) 40 MG tablet Take 1 tablet (40 mg total) by mouth daily.   glucose blood (ACCU-CHEK GUIDE) test strip Check sugars three times daily and as needed when feeling ill E11.65, insulin use   glucose blood test strip Use as instructed to check sugars three times daily E11.65, insulin use   insulin glargine (LANTUS SOLOSTAR) 100 UNIT/ML Solostar Pen Inject  100 Units into the skin every morning. And pen needles 1/day   Insulin Pen Needle (B-D UF III MINI PEN NEEDLES) 31G X 5 MM MISC USE TO INJECT INSULIN DAILY   loratadine (CLARITIN) 10 MG tablet Take 10 mg by mouth daily.   losartan (COZAAR) 50 MG tablet Take 1 tablet (50 mg total) by mouth daily.   lovastatin (MEVACOR) 20 MG tablet Take 1 tablet (20 mg total) by mouth at bedtime.   Magnesium 500 MG TABS Take 1 tablet (500 mg total) by mouth every Monday, Wednesday, and Friday.   metFORMIN (GLUCOPHAGE-XR) 500 MG 24 hr tablet TAKE 1 TABLET BY MOUTH EVERY DAY WITH BREAKFAST   Multiple Vitamins-Minerals ([employer]S SPECTRAVITE PO) Take 1 tablet by mouth at bedtime.   mycophenolate (MYFORTIC) 360 MG TBEC EC tablet Take 360 mg by mouth 2 (two) times daily.   Omega-3 Fatty Acids (FISH OIL PO) Take 1 tablet by mouth at bedtime.   omeprazole (PRILOSEC) 40 MG capsule TAKE 1 CAPSULE EVERY DAY   oxyCODONE (ROXICODONE) 5 MG immediate release tablet 1-2 tabs PO q6 hours prn pain   tacrolimus (PROGRAF) 1 MG capsule Take 1-2 mg by mouth See admin instructions. Take 2 mg by mouth in the morning, then take 1 mg  by mouth in the evening   No facility-administered encounter medications on file as of 10/07/2020.   Recent Relevant Labs: Lab Results  Component Value Date/Time   HGBA1C 9.2 (A) 09/28/2020 08:45 AM   HGBA1C 10.6 (A)  07/23/2020 08:09 AM   HGBA1C 10.6 (H) 01/22/2020 07:28 AM   HGBA1C 10.1 (H) 07/18/2019 11:33 AM   MICROALBUR <0.7 08/27/2014 09:51 AM    Kidney Function Lab Results  Component Value Date/Time   CREATININE 1.30 (H) 10/05/2020 07:56 PM   CREATININE 1.17 04/07/2020 08:39 AM   CREATININE 2.06 (H) 06/14/2016 08:56 AM   CREATININE 1.51 (H) 06/08/2016 12:06 PM   CREATININE 0.9 08/02/2012 08:39 AM   GFR 70.48 04/07/2020 08:39 AM   GFRNONAA >60 08/02/2019 01:09 AM   GFRAA >60 08/02/2019 01:09 AM    Current antihyperglycemic regimen:  Metformin 500mg  ER take one tablet by mouth  daily. Empagliflozin 10mg  take one tablet by mouth daily before breakfast. Insulin glargine 100 units daily in the morning subcutaneously.  What recent interventions/DTPs have been made to improve glycemic control:  check your blood sugar twice a day,  vary the time of day when you check, between before the 3 meals, and at bedtime. Dr. Haywood Lasso stopped glimperide and increased insulin by 10 units.   Have there been any recent hospitalizations or ED visits since last visit with CPP? Yes   Patient denies hypoglycemic symptoms, including Pale, Sweaty, Shaky, Hungry, Nervous/irritable and Vision changes   Patient denies hyperglycemic symptoms, including blurry vision, excessive thirst, fatigue, polyuria and weakness   How often are you checking your blood sugar? Not checking  What are your blood sugars ranging?  Fasting: 117 done at hospital after being there and not eating for over 7 hours. Before meals: N/A After meals: N/A Bedtime: N/A  During the week, how often does your blood glucose drop below 70? Never   Are you checking your feet daily/regularly? Yes- feet look good.   Adherence Review: Is the patient currently on a STATIN medication? Yes Is the patient currently on ACE/ARB medication? Yes Does the patient have >5 day gap between last estimated fill dates? No   Star Rating Drugs:  Lovastatin 20mg  - 07/24/20 90DS  Losartan 50mg  - 09/21/20 90 DS empagliflozin (JARDIANCE) 10 Mg - 09/08/20 30DS  Patient states he has not checked BG since last time we spoke. He does note a decrease in his A1C since January. Patient decided not to take Trulicity at this time. Advised patient I will likely call again within 2 weeks to follow up on more readings.   Follow-Up:  Pharmacist Review  Debbora Dus, CPP notified  Margaretmary Dys, [place] Assistant [phone]  I have reviewed the care management and care coordination activities outlined in this encounter and I am  certifying that I agree with the content of this note. No further action required.  Debbora Dus, PharmD Clinical Pharmacist [facility] [phone]

## 2020-10-18 ENCOUNTER — Other Ambulatory Visit: Payer: Self-pay | Admitting: Family Medicine

## 2020-10-30 ENCOUNTER — Telehealth: Payer: Self-pay

## 2020-10-30 NOTE — Chronic Care Management (AMB) (Addendum)
Chronic Care Management Pharmacy Assistant   Name: Colton Porter  MRN: [id number] DOB: [birth year]  Reason for Encounter: Disease State- Diabetes  Recent office visits:  None since last CCM contact  Recent consult visits:  10/12/20- Dr. Leanora Cover- Orthopedics- Post Op right thumb laceration  Hospital visits:  10/05/20- ED visit due to right thumb laceration  Medications: Outpatient Encounter Medications as of 10/30/2020  Medication Sig   Accu-Chek FastClix Lancets MISC Use three times daily to check sugars E65.11, insulin use   acetaminophen (TYLENOL) 500 MG tablet Take 500 mg by mouth every 6 (six) hours as needed for mild pain.   allopurinol (ZYLOPRIM) 300 MG tablet Take 1 tablet (300 mg total) by mouth daily.   ASPIR-LOW 81 MG EC tablet Take 1 tablet (81 mg total) by mouth daily.   B-D UF III MINI PEN NEEDLES 31G X 5 MM MISC USE TO INJECT INSULIN DAILY   Blood Glucose Monitoring Suppl (ACCU-CHEK GUIDE) w/Device KIT 1 Units by Does not apply route as directed.   Cholecalciferol (VITAMIN D3) 2000 units TABS Take 1 tablet by mouth at bedtime.   docusate sodium (COLACE) 100 MG capsule Take 100 mg by mouth daily as needed for mild constipation.    doxycycline (VIBRAMYCIN) 50 MG capsule Take 2 capsules (100 mg total) by mouth 2 (two) times daily.   empagliflozin (JARDIANCE) 10 MG TABS tablet Take 1 tablet (10 mg total) by mouth daily before breakfast.   escitalopram (LEXAPRO) 10 MG tablet TAKE 1 TABLET EVERY DAY   fluticasone (FLONASE) 50 MCG/ACT nasal spray SPRAY 2 SPRAYS INTO EACH NOSTRIL EVERY DAY   furosemide (LASIX) 40 MG tablet Take 1 tablet (40 mg total) by mouth daily.   glucose blood (ACCU-CHEK GUIDE) test strip Check sugars three times daily and as needed when feeling ill E11.65, insulin use   glucose blood test strip Use as instructed to check sugars three times daily E11.65, insulin use   insulin glargine (LANTUS SOLOSTAR) 100 UNIT/ML Solostar Pen Inject 100 Units into  the skin every morning. And pen needles 1/day   loratadine (CLARITIN) 10 MG tablet Take 10 mg by mouth daily.   losartan (COZAAR) 50 MG tablet Take 1 tablet (50 mg total) by mouth daily.   lovastatin (MEVACOR) 20 MG tablet Take 1 tablet (20 mg total) by mouth at bedtime.   Magnesium 500 MG TABS Take 1 tablet (500 mg total) by mouth every Monday, Wednesday, and Friday.   metFORMIN (GLUCOPHAGE-XR) 500 MG 24 hr tablet TAKE 1 TABLET BY MOUTH EVERY DAY WITH BREAKFAST   Multiple Vitamins-Minerals ([employer] SPECTRAVITE PO) Take 1 tablet by mouth at bedtime.   mycophenolate (MYFORTIC) 360 MG TBEC EC tablet Take 360 mg by mouth 2 (two) times daily.   Omega-3 Fatty Acids (FISH OIL PO) Take 1 tablet by mouth at bedtime.   omeprazole (PRILOSEC) 40 MG capsule TAKE 1 CAPSULE EVERY DAY   oxyCODONE (ROXICODONE) 5 MG immediate release tablet 1-2 tabs PO q6 hours prn pain   tacrolimus (PROGRAF) 1 MG capsule Take 1-2 mg by mouth See admin instructions. Take 2 mg by mouth in the morning, then take 1 mg  by mouth in the evening   No facility-administered encounter medications on file as of 10/30/2020.     Recent Relevant Labs: Lab Results  Component Value Date/Time   HGBA1C 9.2 (A) 09/28/2020 08:45 AM   HGBA1C 10.6 (A) 07/23/2020 08:09 AM   HGBA1C 10.6 (H) 01/22/2020 07:28 AM  HGBA1C 10.1 (H) 07/18/2019 11:33 AM   MICROALBUR <0.7 08/27/2014 09:51 AM    Kidney Function Lab Results  Component Value Date/Time   CREATININE 1.30 (H) 10/05/2020 07:56 PM   CREATININE 1.17 04/07/2020 08:39 AM   CREATININE 2.06 (H) 06/14/2016 08:56 AM   CREATININE 1.51 (H) 06/08/2016 12:06 PM   CREATININE 0.9 08/02/2012 08:39 AM   GFR 70.48 04/07/2020 08:39 AM   GFRNONAA >60 08/02/2019 01:09 AM   GFRAA >60 08/02/2019 01:09 AM   Diabetes  Current antihyperglycemic regimen:  Metformin 500mg  ER take one tablet by mouth daily Empagliflozin 10mg  take one tablet by mouth daily before breakfast Insulin glargine 100 units daily in  the morning subcutaneously  Patient verbally confirms he is taking the above medications as directed. Yes denies missing any doses  What recent interventions/DTPs have been made to improve glycemic control:  No recent interventions.   Have there been any recent hospitalizations or ED visits since last visit with CPP? Yes ED visit for thumb laceration  Patient denies hypoglycemic symptoms, including Pale, Sweaty, Shaky, Hungry, Nervous/irritable and Vision changes  Patient denies hyperglycemic symptoms, including blurry vision, excessive thirst, fatigue, polyuria and weakness  How often are you checking your blood sugar? Patient does not check blood sugar consistently. Patient stated that he would check over the weekend and would get back with me.   What are your blood sugars ranging?  Fasting:  10/08/20- 194 10/09/20- 248 10/20/20- 266  On insulin? Yes How many units:100 units  During the week, how often does your blood glucose drop below 70? Never  Are you checking your feet daily/regularly? Yes  Adherence Review: Is the patient currently on a STATIN medication? Yes Is the patient currently on ACE/ARB medication? Yes Does the patient have >5 day gap between last estimated fill dates? CPP to review  Star Rating Drugs:  Medication:  Last Fill: Day Supply Losartan 50 mg 09/21/20 90 Lovastatin 20 mg 07/24/20 90  Patient states he is doing well. He still is not checking his blood sugar regularly. Patient states he will check a couple of times over the weekend and then send in his readings. Patient reminded of upcoming appointment with Dr. [name] 11/04/20 at 8:00. Will follow up with patient for additional readings in 2 weeks.   Follow-Up:  Pharmacist Review  Debbora Dus, CPP notified  Margaretmary Dys, [place] Assistant [phone]  I have reviewed the care management and care coordination activities outlined in this encounter and I am certifying that I  agree with the content of this note. No further action required.  Debbora Dus, PharmD Clinical Pharmacist [facility] [phone]

## 2020-11-04 ENCOUNTER — Ambulatory Visit: Payer: Medicare HMO | Admitting: Family Medicine

## 2020-11-05 DIAGNOSIS — Z79899 Other long term (current) drug therapy: Secondary | ICD-10-CM | POA: Diagnosis not present

## 2020-11-05 DIAGNOSIS — Z944 Liver transplant status: Secondary | ICD-10-CM | POA: Diagnosis not present

## 2020-11-05 DIAGNOSIS — Z48298 Encounter for aftercare following other organ transplant: Secondary | ICD-10-CM | POA: Diagnosis not present

## 2020-11-10 ENCOUNTER — Ambulatory Visit (INDEPENDENT_AMBULATORY_CARE_PROVIDER_SITE_OTHER): Payer: Medicare HMO | Admitting: Family Medicine

## 2020-11-10 ENCOUNTER — Encounter: Payer: Self-pay | Admitting: Family Medicine

## 2020-11-10 ENCOUNTER — Other Ambulatory Visit: Payer: Self-pay

## 2020-11-10 VITALS — BP 138/78 | HR 83 | Temp 97.9°F | Ht 66.5 in | Wt 264.1 lb

## 2020-11-10 DIAGNOSIS — I5032 Chronic diastolic (congestive) heart failure: Secondary | ICD-10-CM

## 2020-11-10 DIAGNOSIS — Z6841 Body Mass Index (BMI) 40.0 and over, adult: Secondary | ICD-10-CM | POA: Diagnosis not present

## 2020-11-10 DIAGNOSIS — Z794 Long term (current) use of insulin: Secondary | ICD-10-CM | POA: Diagnosis not present

## 2020-11-10 DIAGNOSIS — Z944 Liver transplant status: Secondary | ICD-10-CM

## 2020-11-10 DIAGNOSIS — L290 Pruritus ani: Secondary | ICD-10-CM

## 2020-11-10 DIAGNOSIS — N1831 Chronic kidney disease, stage 3a: Secondary | ICD-10-CM

## 2020-11-10 DIAGNOSIS — D849 Immunodeficiency, unspecified: Secondary | ICD-10-CM | POA: Diagnosis not present

## 2020-11-10 DIAGNOSIS — E1169 Type 2 diabetes mellitus with other specified complication: Secondary | ICD-10-CM | POA: Diagnosis not present

## 2020-11-10 MED ORDER — EMPAGLIFLOZIN 10 MG PO TABS: 10.0000 mg | ORAL_TABLET | Freq: Every day | ORAL | 1 refills | Status: DC

## 2020-11-10 NOTE — Assessment & Plan Note (Addendum)
Stable period with addition of jardiance.  To establish with new cardiologist (Dr Meda Coffee recently left the area)

## 2020-11-10 NOTE — Assessment & Plan Note (Signed)
Continue low dose jardiance.

## 2020-11-10 NOTE — Assessment & Plan Note (Signed)
Recommend 2nd COVID booster - he will obtain.

## 2020-11-10 NOTE — Assessment & Plan Note (Signed)
Never submitted pinworm prep - reordered today.

## 2020-11-10 NOTE — Progress Notes (Signed)
Patient ID: Colton Porter, male    DOB: 09-10-1965, 55 y.o.   MRN: [id number]  This visit was conducted in person.  BP 138/78   Pulse 83   Temp 97.9 F (36.6 C) (Temporal)   Ht 5' 6.5" (1.689 m)   Wt 264 lb 1 oz (119.8 kg)   SpO2 95%   BMI 41.98 kg/m    CC: 3 mo DM f/u visit  Subjective:   HPI: DONTREAL Porter is a 55 y.o. male presenting on 11/10/2020 for Diabetes (Here for 3 mo f/u.)   Seeing Dr Fredna Dow s/p thumb laceration repair suffered 09/2020.  Ongoing perianal itching for over a year, worse in heat. Worse when in bed at night. Occasional stool discharge.   DM - does regularly check sugars 253 this morning. Compliant with antihyperglycemic regimen which includes: jardiance 10mg  daily (new start, tolerating well, without UTI or yeast infection symptoms), lantus 100u daily (recent increase, glimepiride stopped), metformin XR 500mg  daily. Now followed by endocrinology (Dr Loanne Drilling). Previously did not tolerate ozempic. Denies low sugars or hypoglycemic symptoms. Foot paresthesias. Last diabetic eye exam 02/2020. Pneumovax: 03/2015. Prevnar: not due. Glucometer brand: accuchek. DSME: completed at [facility] 2019. Lab Results  Component Value Date   HGBA1C 9.2 (A) 09/28/2020   Diabetic Foot Exam - Simple   Simple Foot Form Diabetic Foot exam was performed with the following findings: Yes 11/10/2020  9:21 AM  Visual Inspection No deformities, no ulcerations, no other skin breakdown bilaterally: Yes Sensation Testing Intact to touch and monofilament testing bilaterally: Yes Pulse Check Posterior Tibialis and Dorsalis pulse intact bilaterally: Yes Comments 1-2+ DP bilaterally    Lab Results  Component Value Date   MICROALBUR <0.7 08/27/2014         Relevant past medical, surgical, family and social history reviewed and updated as indicated. Interim medical history since our last visit reviewed. Allergies and medications reviewed and updated. Outpatient Medications Prior to  Visit  Medication Sig Dispense Refill  . Accu-Chek FastClix Lancets MISC Use three times daily to check sugars E65.11, insulin use 100 each 12  . acetaminophen (TYLENOL) 500 MG tablet Take 500 mg by mouth every 6 (six) hours as needed for mild pain.    Marland Kitchen allopurinol (ZYLOPRIM) 300 MG tablet Take 1 tablet (300 mg total) by mouth daily. 90 tablet 0  . ASPIR-LOW 81 MG EC tablet Take 1 tablet (81 mg total) by mouth daily. 90 tablet 3  . B-D UF III MINI PEN NEEDLES 31G X 5 MM MISC USE TO INJECT INSULIN DAILY 100 each 3  . Blood Glucose Monitoring Suppl (ACCU-CHEK GUIDE) w/Device KIT 1 Units by Does not apply route as directed. 1 kit 0  . Cholecalciferol (VITAMIN D3) 2000 units TABS Take 1 tablet by mouth at bedtime.    . docusate sodium (COLACE) 100 MG capsule Take 100 mg by mouth daily as needed for mild constipation.     Marland Kitchen escitalopram (LEXAPRO) 10 MG tablet TAKE 1 TABLET EVERY DAY 90 tablet 3  . fluticasone (FLONASE) 50 MCG/ACT nasal spray SPRAY 2 SPRAYS INTO EACH NOSTRIL EVERY DAY 48 mL 0  . furosemide (LASIX) 40 MG tablet Take 1 tablet (40 mg total) by mouth daily. 30 tablet 3  . glucose blood (ACCU-CHEK GUIDE) test strip Check sugars three times daily and as needed when feeling ill E11.65, insulin use 100 each 11  . glucose blood test strip Use as instructed to check sugars three times daily E11.65, insulin use  100 each 12  . insulin glargine (LANTUS SOLOSTAR) 100 UNIT/ML Solostar Pen Inject 100 Units into the skin every morning. And pen needles 1/day 105 mL 3  . loratadine (CLARITIN) 10 MG tablet Take 10 mg by mouth daily.    Marland Kitchen losartan (COZAAR) 50 MG tablet Take 1 tablet (50 mg total) by mouth daily. 30 tablet 1  . lovastatin (MEVACOR) 20 MG tablet Take 1 tablet (20 mg total) by mouth at bedtime. 90 tablet 3  . Magnesium 500 MG TABS Take 1 tablet (500 mg total) by mouth every Monday, Wednesday, and Friday. 30 tablet   . metFORMIN (GLUCOPHAGE-XR) 500 MG 24 hr tablet TAKE 1 TABLET BY MOUTH EVERY  DAY WITH BREAKFAST 90 tablet 1  . Multiple Vitamins-Minerals ([employer]S SPECTRAVITE PO) Take 1 tablet by mouth at bedtime.    . mycophenolate (MYFORTIC) 360 MG TBEC EC tablet Take 360 mg by mouth 2 (two) times daily.    . Omega-3 Fatty Acids (FISH OIL PO) Take 1 tablet by mouth at bedtime.    Marland Kitchen omeprazole (PRILOSEC) 40 MG capsule TAKE 1 CAPSULE EVERY DAY 90 capsule 3  . oxyCODONE (ROXICODONE) 5 MG immediate release tablet 1-2 tabs PO q6 hours prn pain 20 tablet 0  . tacrolimus (PROGRAF) 1 MG capsule Take 1-2 mg by mouth See admin instructions. Take 2 mg by mouth in the morning, then take 1 mg  by mouth in the evening    . empagliflozin (JARDIANCE) 10 MG TABS tablet Take 1 tablet (10 mg total) by mouth daily before breakfast. 90 tablet 1  . doxycycline (VIBRAMYCIN) 50 MG capsule Take 2 capsules (100 mg total) by mouth 2 (two) times daily. 28 capsule 0   No facility-administered medications prior to visit.     Per HPI unless specifically indicated in ROS section below Review of Systems Objective:  BP 138/78   Pulse 83   Temp 97.9 F (36.6 C) (Temporal)   Ht 5' 6.5" (1.689 m)   Wt 264 lb 1 oz (119.8 kg)   SpO2 95%   BMI 41.98 kg/m   Wt Readings from Last 3 Encounters:  11/10/20 264 lb 1 oz (119.8 kg)  09/28/20 269 lb 6.4 oz (122.2 kg)  08/07/20 270 lb 1 oz (122.5 kg)      Physical Exam Vitals and nursing note reviewed.  Constitutional:      General: He is not in acute distress.    Appearance: Normal appearance. He is well-developed. He is obese. He is not ill-appearing.  HENT:     Head: Normocephalic and atraumatic.  Eyes:     General: No scleral icterus.    Extraocular Movements: Extraocular movements intact.     Conjunctiva/sclera: Conjunctivae normal.     Pupils: Pupils are equal, round, and reactive to light.  Cardiovascular:     Rate and Rhythm: Normal rate and regular rhythm.     Pulses: Normal pulses.     Heart sounds: Normal heart sounds. No murmur heard.   Pulmonary:      Effort: Pulmonary effort is normal. No respiratory distress.     Breath sounds: Normal breath sounds. No wheezing, rhonchi or rales.  Musculoskeletal:     Cervical back: Normal range of motion and neck supple.     Right lower leg: No edema.     Left lower leg: No edema.     Comments: See HPI for foot exam if done  Lymphadenopathy:     Cervical: No cervical adenopathy.  Skin:  General: Skin is warm and dry.     Findings: No rash.  Neurological:     Mental Status: He is alert.  Psychiatric:        Mood and Affect: Mood normal.        Behavior: Behavior normal.       Results for orders placed or performed during the hospital encounter of 10/05/20  Resp Panel by RT-PCR (Flu A&B, Covid) Nasopharyngeal Swab   Specimen: Nasopharyngeal Swab; Nasopharyngeal(NP) swabs in vial transport medium  Result Value Ref Range   SARS Coronavirus 2 by RT PCR NEGATIVE NEGATIVE   Influenza A by PCR NEGATIVE NEGATIVE   Influenza B by PCR NEGATIVE NEGATIVE  Glucose, capillary  Result Value Ref Range   Glucose-Capillary 131 (H) 70 - 99 mg/dL  CBG monitoring, ED  Result Value Ref Range   Glucose-Capillary 117 (H) 70 - 99 mg/dL  I-STAT, chem 8  Result Value Ref Range   Sodium 141 135 - 145 mmol/L   Potassium 3.5 3.5 - 5.1 mmol/L   Chloride 103 98 - 111 mmol/L   BUN 17 6 - 20 mg/dL   Creatinine, Ser 1.30 (H) 0.61 - 1.24 mg/dL   Glucose, Bld 124 (H) 70 - 99 mg/dL   Calcium, Ion 1.15 1.15 - 1.40 mmol/L   TCO2 24 22 - 32 mmol/L   Hemoglobin 13.3 13.0 - 17.0 g/dL   HCT 39.0 39.0 - 52.0 %   Assessment & Plan:  This visit occurred during the SARS-CoV-2 public health emergency.  Safety protocols were in place, including screening questions prior to the visit, additional usage of staff PPE, and extensive cleaning of exam room while observing appropriate contact time as indicated for disinfecting solutions.   Problem List Items Addressed This Visit    Type 2 diabetes mellitus with other specified  complication ([place]) - Primary (Chronic)    Chronic, mild improvement from prior A1c however ranges remain uncontrolled. He is having lantus slowly titrated through endo. Discussed possible increased jardiance dose to $Remov'25mg'jobiDm$  daily - will hold higher dose at this time due to known CKD as well as coming into summer months, reassess at CPE in 3 months.       Relevant Medications   empagliflozin (JARDIANCE) 10 MG TABS tablet   Status post liver transplant ([place]) (Chronic)   Morbid obesity with BMI of 40.0-44.9, adult (HCC)   Relevant Medications   empagliflozin (JARDIANCE) 10 MG TABS tablet   (HFpEF) heart failure with preserved ejection fraction (HCC)    Stable period with addition of jardiance.  To establish with new cardiologist (Dr Meda Coffee recently left the area)      CKD (chronic kidney disease) stage 3, GFR 30-59 ml/min (HCC)    Continue low dose jardiance.       Perianal pruritus    Never submitted pinworm prep - reordered today.       Relevant Orders   Pinworm prep   Immunosuppressed status ([place])    Recommend 2nd COVID booster - he will obtain.           Meds ordered this encounter  Medications  . empagliflozin (JARDIANCE) 10 MG TABS tablet    Sig: Take 1 tablet (10 mg total) by mouth daily before breakfast.    Dispense:  90 tablet    Refill:  1   Orders Placed This Encounter  Procedures  . Pinworm prep    Standing Status:   Future    Standing Expiration Date:   11/10/2021  Patient Instructions  Pass by lab to pick up pinworm test.  Return in 3 months for physical/wellness visit  Consider second COVID booster.  Continue current medicines.    Follow up plan: Return in about 3 months (around 02/10/2021) for annual exam, prior fasting for blood work.  Ria Bush, MD

## 2020-11-10 NOTE — Patient Instructions (Addendum)
Pass by lab to pick up pinworm test.  Return in 3 months for physical/wellness visit  Consider second COVID booster.  Continue current medicines.

## 2020-11-10 NOTE — Assessment & Plan Note (Signed)
Chronic, mild improvement from prior A1c however ranges remain uncontrolled. He is having lantus slowly titrated through endo. Discussed possible increased jardiance dose to 25mg  daily - will hold higher dose at this time due to known CKD as well as coming into summer months, reassess at CPE in 3 months.

## 2020-11-12 ENCOUNTER — Other Ambulatory Visit: Payer: Self-pay

## 2020-11-12 MED ORDER — FUROSEMIDE 40 MG PO TABS: 40.0000 mg | ORAL_TABLET | Freq: Every day | ORAL | 0 refills | Status: DC

## 2020-11-12 NOTE — Telephone Encounter (Signed)
Yes, please refill. Thanks!

## 2020-11-12 NOTE — Telephone Encounter (Signed)
[facility] mail order pharmacy is requesting a refill on furosemide. Pt has an upcoming appt with Dr. Johney Frame on 11/24/20. Would Dr. Johney Frame like to refill this medication? Please address

## 2020-11-13 ENCOUNTER — Telehealth: Payer: Self-pay

## 2020-11-13 NOTE — Chronic Care Management (AMB) (Addendum)
Chronic Care Management Pharmacy Assistant   Name: Colton Porter  MRN: [id number] DOB: 04-20-1966  Reason for Encounter: Disease State Diabetes   Recent office visits:  11/10/20- Dr. [name]- PCP- Ordered Pinworm Prep. Discussed possible increased jardiance dose to 12m daily - will hold higher dose at this time due to known CKD, reassess at CPE in 3 months.  Recent consult visits:  No consults since last CCM contact  Hospital visits:  10/05/20 ED visit for finger lacertions  Medications: Outpatient Encounter Medications as of 11/13/2020  Medication Sig   Accu-Chek FastClix Lancets MISC Use three times daily to check sugars E65.11, insulin use   acetaminophen (TYLENOL) 500 MG tablet Take 500 mg by mouth every 6 (six) hours as needed for mild pain.   allopurinol (ZYLOPRIM) 300 MG tablet Take 1 tablet (300 mg total) by mouth daily.   ASPIR-LOW 81 MG EC tablet Take 1 tablet (81 mg total) by mouth daily.   B-D UF III MINI PEN NEEDLES 31G X 5 MM MISC USE TO INJECT INSULIN DAILY   Blood Glucose Monitoring Suppl (ACCU-CHEK GUIDE) w/Device KIT 1 Units by Does not apply route as directed.   Cholecalciferol (VITAMIN D3) 2000 units TABS Take 1 tablet by mouth at bedtime.   docusate sodium (COLACE) 100 MG capsule Take 100 mg by mouth daily as needed for mild constipation.    empagliflozin (JARDIANCE) 10 MG TABS tablet Take 1 tablet (10 mg total) by mouth daily before breakfast.   escitalopram (LEXAPRO) 10 MG tablet TAKE 1 TABLET EVERY DAY   fluticasone (FLONASE) 50 MCG/ACT nasal spray SPRAY 2 SPRAYS INTO EACH NOSTRIL EVERY DAY   furosemide (LASIX) 40 MG tablet Take 1 tablet (40 mg total) by mouth daily. Please keep upcoming appt in May 2022 with Dr. PJohney Framebefore anymore refills. Thank you   glucose blood (ACCU-CHEK GUIDE) test strip Check sugars three times daily and as needed when feeling ill E11.65, insulin use   glucose blood test strip Use as instructed to check sugars three times  daily E11.65, insulin use   insulin glargine (LANTUS SOLOSTAR) 100 UNIT/ML Solostar Pen Inject 100 Units into the skin every morning. And pen needles 1/day   loratadine (CLARITIN) 10 MG tablet Take 10 mg by mouth daily.   losartan (COZAAR) 50 MG tablet Take 1 tablet (50 mg total) by mouth daily.   lovastatin (MEVACOR) 20 MG tablet Take 1 tablet (20 mg total) by mouth at bedtime.   Magnesium 500 MG TABS Take 1 tablet (500 mg total) by mouth every Monday, Wednesday, and Friday.   metFORMIN (GLUCOPHAGE-XR) 500 MG 24 hr tablet TAKE 1 TABLET BY MOUTH EVERY DAY WITH BREAKFAST   Multiple Vitamins-Minerals ([employer]S SPECTRAVITE PO) Take 1 tablet by mouth at bedtime.   mycophenolate (MYFORTIC) 360 MG TBEC EC tablet Take 360 mg by mouth 2 (two) times daily.   Omega-3 Fatty Acids (FISH OIL PO) Take 1 tablet by mouth at bedtime.   omeprazole (PRILOSEC) 40 MG capsule TAKE 1 CAPSULE EVERY DAY   oxyCODONE (ROXICODONE) 5 MG immediate release tablet 1-2 tabs PO q6 hours prn pain   tacrolimus (PROGRAF) 1 MG capsule Take 1-2 mg by mouth See admin instructions. Take 2 mg by mouth in the morning, then take 1 mg  by mouth in the evening   No facility-administered encounter medications on file as of 11/13/2020.     Recent Relevant Labs: Lab Results  Component Value Date/Time   HGBA1C 9.2 (A) 09/28/2020 08:45  AM   HGBA1C 10.6 (A) 07/23/2020 08:09 AM   HGBA1C 10.6 (H) 01/22/2020 07:28 AM   HGBA1C 10.1 (H) 07/18/2019 11:33 AM   MICROALBUR <0.7 08/27/2014 09:51 AM    Kidney Function Lab Results  Component Value Date/Time   CREATININE 1.30 (H) 10/05/2020 07:56 PM   CREATININE 1.17 04/07/2020 08:39 AM   CREATININE 2.06 (H) 06/14/2016 08:56 AM   CREATININE 1.51 (H) 06/08/2016 12:06 PM   CREATININE 0.9 08/02/2012 08:39 AM   GFR 70.48 04/07/2020 08:39 AM   GFRNONAA >60 08/02/2019 01:09 AM   GFRAA >60 08/02/2019 01:09 AM     Current antihyperglycemic regimen:  Metformin 500mg ER take one tablet by mouth  daily Empagliflozin 10mg take one tablet by mouth daily before breakfast Insulin glargine 100 units daily in the morning subcutaneously   Patient verbally confirms he is taking the above medications as directed. Yes  What recent interventions/DTPs have been made to improve glycemic control:  No recent interventions. Dr. Gutierrez discussed possibly increasing Jardiance at next visit in 3 months.  Patient denies hypoglycemic symptoms, including Pale, Sweaty, Shaky, Hungry, Nervous/irritable and Vision changes  Patient denies hyperglycemic symptoms, including blurry vision, excessive thirst, fatigue, polyuria and weakness  How often are you checking your blood sugar? Infrequently- sometimes once daily  What are your blood sugars ranging?  Fasting:  11/10/20- 253 11/13/20- 203 Before meals:  11/10/20- 202 11/12/20- 202 After meals:  11/11/20- 150  On insulin? Yes How many units: 100 units Lantus  During the week, how often does your blood glucose drop below 70? Never  Follow-Up:  Pharmacist Review  Michelle Adams, CPP notified  Lindsay Saintsing, CMA Clinical Pharmacy Assistant [phone]  I have reviewed the care management and care coordination activities outlined in this encounter and I am certifying that I agree with the content of this note. Continue to follow up bimonthly to encourage routine BG monitoring, low carb diet, and exercise. Next endo appt 12/2020.  Michelle Adams, PharmD Clinical Pharmacist [facility] [phone]    

## 2020-11-22 NOTE — Progress Notes (Signed)
Cardiology Office Note:    Date:  11/24/2020   ID:  Colton Porter, DOB 03-28-1966, MRN [id number]  PCP:  Ria Bush, MD   [facility] Providers Cardiologist:  None {   Referring MD: Ria Bush, MD     History of Present Illness:    Colton Porter is a 55 y.o. male with a hx of HLD,obesity, OSA on CPAP, HTN, chronic diastolic dysfunction,chronic thrombocytopenia with platelet around 61, significant drinking history with subsequent alcoholic cirrhosis and liver transplant in July 2017 who was previously followed by Dr. Meda Coffee who now returns to clinic for follow-up of his chronic diastolic heart failure.  Per review of the record, the patient had a left and right heart cath on 06/10/13 which showed normal coronaries and elevated right sided pressures in the setting of diastolic dysfunction. TTE 05/2016 showed normal LVEF at 55-60% with grade 2DD. No wall motion abnormalities.  Myoview in 2018 showed normal LVEF and no ischemia or prior infarct. TTE 07/31/19 with LVEF 65-70%, G1DD, no significant valve disease.  Had severe COVID from 06/2019-07/2019. Course was complicated by pneumonia and seizures. Had long recovery but is doing a lot better. States he has some short term memory loss.   Last saw Dr. Meda Coffee on 06/08/20 where he was doing well. Compliant with meds and CPAP. Jardiance was added during that visit.  Today, the patient states that he feels well. No chest pain, SOB, orthopnea, or PND. Wears compression socks everyday and swelling controlled. Blood pressure well controlled. Tolerating all medications. He tries to stay active and does yard work.  Admits that he is not adhering to a healthy diet as he eats a lot of sweets. He is trying to work on this.   Past Medical History:  Diagnosis Date  . Alcohol dependence ([place]) 07/11/2012  . Alcoholic cirrhosis of liver with ascites ([place]) 07/2014   s./p transplant 12/2015  . Allergy   . Anemia   . Cellulitis of left  leg   . Chronic diastolic heart failure ([place]) 11/01/2013  . CKD (chronic kidney disease) stage 3, GFR 30-59 ml/min (HCC) 11/04/2015  . Diabetes mellitus without complication ([place])    Type II  . GERD (gastroesophageal reflux disease)   . Hyperlipidemia   . Hypertension   . Neuropathy   . OSA on CPAP 07/11/2012   HST 07/2013:  AHI 39/hr.    . Pneumonia due to COVID-19 virus 06/2019  . Thrombocytopenia ([place]) 12/15/2011    Past Surgical History:  Procedure Laterality Date  . COLONOSCOPY  08/2013   hyperplastic polyps, hemorrhoids Ardis Hughs)  . 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 FINGER AMPUTATION  1997   left 4th finger - radial arm saw  . I & D EXTREMITY Right 10/05/2020   Procedure: 1.  Irrigation debridement of right thumb open distal phalanx fracture 2.  Repair of right thumb skin and nailbed laceration ;  Surgeon: Leanora Cover, MD;  Location: [place];  Service: Orthopedics;  Laterality: Right;  . LEFT AND RIGHT HEART CATHETERIZATION WITH CORONARY ANGIOGRAM N/A 06/10/2013   Procedure: LEFT AND RIGHT HEART CATHETERIZATION WITH CORONARY ANGIOGRAM;  Surgeon: Blane Ohara, MD;  Location: [facility] CATH LAB;  Service: Cardiovascular;  Laterality: N/A;  . LIVER TRANSPLANTATION  [id number]/[id number]   alcoholic cirrhosis ([facility])    Current Medications: Current Meds  Medication Sig  . Accu-Chek FastClix Lancets MISC Use three times daily to check sugars E65.11, insulin use  .  acetaminophen (TYLENOL) 500 MG tablet Take 500 mg by mouth every 6 (six) hours as needed for mild pain.  Marland Kitchen allopurinol (ZYLOPRIM) 300 MG tablet Take 1 tablet (300 mg total) by mouth daily.  . ASPIR-LOW 81 MG EC tablet Take 1 tablet (81 mg total) by mouth daily.  . B-D UF III MINI PEN NEEDLES 31G X 5 MM MISC USE TO INJECT INSULIN DAILY  . Blood Glucose Monitoring Suppl (ACCU-CHEK GUIDE) w/Device KIT 1 Units by Does not apply route as directed.  . Cholecalciferol (VITAMIN D3) 2000  units TABS Take 1 tablet by mouth at bedtime.  . docusate sodium (COLACE) 100 MG capsule Take 100 mg by mouth daily as needed for mild constipation.   . empagliflozin (JARDIANCE) 10 MG TABS tablet Take 1 tablet (10 mg total) by mouth daily before breakfast.  . escitalopram (LEXAPRO) 10 MG tablet TAKE 1 TABLET EVERY DAY  . fluticasone (FLONASE) 50 MCG/ACT nasal spray SPRAY 2 SPRAYS INTO EACH NOSTRIL EVERY DAY  . glucose blood (ACCU-CHEK GUIDE) test strip Check sugars three times daily and as needed when feeling ill E11.65, insulin use  . glucose blood test strip Use as instructed to check sugars three times daily E11.65, insulin use  . insulin glargine (LANTUS SOLOSTAR) 100 UNIT/ML Solostar Pen Inject 100 Units into the skin every morning. And pen needles 1/day  . loratadine (CLARITIN) 10 MG tablet Take 10 mg by mouth daily.  Marland Kitchen losartan (COZAAR) 50 MG tablet Take 1 tablet (50 mg total) by mouth daily.  Marland Kitchen lovastatin (MEVACOR) 20 MG tablet Take 1 tablet (20 mg total) by mouth at bedtime.  . Magnesium 500 MG TABS Take 1 tablet (500 mg total) by mouth every Monday, Wednesday, and Friday.  . metFORMIN (GLUCOPHAGE-XR) 500 MG 24 hr tablet TAKE 1 TABLET BY MOUTH EVERY DAY WITH BREAKFAST  . Multiple Vitamins-Minerals ([employer] SPECTRAVITE PO) Take 1 tablet by mouth at bedtime.  . mycophenolate (MYFORTIC) 360 MG TBEC EC tablet Take 360 mg by mouth 2 (two) times daily.  . Omega-3 Fatty Acids (FISH OIL PO) Take 1 tablet by mouth at bedtime.  Marland Kitchen omeprazole (PRILOSEC) 40 MG capsule TAKE 1 CAPSULE EVERY DAY  . oxyCODONE (ROXICODONE) 5 MG immediate release tablet 1-2 tabs PO q6 hours prn pain  . tacrolimus (PROGRAF) 1 MG capsule Take 1-2 mg by mouth See admin instructions. Take 2 mg by mouth in the morning, then take 1 mg  by mouth in the evening  . [DISCONTINUED] furosemide (LASIX) 40 MG tablet Take 1 tablet (40 mg total) by mouth daily. Please keep upcoming appt in May 2022 with Dr. Johney Frame before anymore refills.  Thank you     Allergies:   Tolmetin, Ambien [zolpidem tartrate], Morphine and related, Hydrochlorothiazide w-triamterene, and Lisinopril   Social History   Socioeconomic History  . Marital status: Married    Spouse name: Not on file  . Number of children: 2  . Years of education: Not on file  . Highest education level: Not on file  Occupational History  . Occupation: Investment banker, corporate    Comment: no work lately  Tobacco Use  . Smoking status: Never Smoker  . Smokeless tobacco: Former Systems developer    Types: Secondary school teacher  . Vaping Use: Never used  Substance and Sexual Activity  . Alcohol use: No    Alcohol/week: 0.0 standard drinks    Comment: 4-6 drinks daily - NO ETOH SINCE APRIL 2016  . Drug use: Not Currently  Comment: remote use of marijuana in the past, has since quit  . Sexual activity: Not on file  Other Topics Concern  . Not on file  Social History Narrative    Lives with wife   Occupation: full disability after cirrhosis dx, some working    Activity: some yardwork   Diet: good water, good fruit intake, lots of red meats   Social Determinants of Health   Financial Resource Strain: Low Risk   . Difficulty of Paying Living Expenses: Not hard at all  Food Insecurity: No Food Insecurity  . Worried About Charity fundraiser in the Last Year: Never true  . Ran Out of Food in the Last Year: Never true  Transportation Needs: No Transportation Needs  . Lack of Transportation (Medical): No  . Lack of Transportation (Non-Medical): No  Physical Activity: Inactive  . Days of Exercise per Week: 0 days  . Minutes of Exercise per Session: 0 min  Stress: No Stress Concern Present  . Feeling of Stress : Not at all  Social Connections: Not on file     Family History: The patient's family history includes Emphysema in his father and mother; Heart disease in his father; Hypertension in his father; Lung cancer in his maternal aunt; Stroke in his mother and paternal  grandmother. There is no history of Heart attack or Diabetes.  ROS:   Please see the history of present illness.    Review of Systems  Constitutional: Negative for chills and fever.  HENT: Negative for sore throat.   Eyes: Negative for blurred vision.  Respiratory: Negative for shortness of breath.   Cardiovascular: Positive for leg swelling. Negative for chest pain, palpitations, orthopnea, claudication and PND.  Gastrointestinal: Negative for nausea and vomiting.  Genitourinary: Negative for dysuria.  Musculoskeletal: Negative for falls.  Neurological: Negative for dizziness and loss of consciousness.  Psychiatric/Behavioral: Negative for substance abuse.    EKGs/Labs/Other Studies Reviewed:    The following studies were reviewed today: TTE 09-23-19: IMPRESSIONS  1. Left ventricular ejection fraction, by visual estimation, is 65 to  70%. The left ventricle has hyperdynamic function. There is moderately  increased left ventricular hypertrophy.  2. Left ventricular diastolic parameters are consistent with Grade I  diastolic dysfunction (impaired relaxation).  3. The left ventricle has no regional wall motion abnormalities.  4. Global right ventricle has normal systolic function.The right  ventricular size is normal. Mildly increased right ventricular wall  thickness.  5. Left atrial size was normal.  6. Right atrial size was normal.  7. The mitral valve is grossly normal. No evidence of mitral valve  regurgitation.  8. The tricuspid valve is grossly normal.  9. The tricuspid valve is grossly normal. Tricuspid valve regurgitation  is trivial.  10. The aortic valve is grossly normal. Aortic valve regurgitation is not  visualized.  11. The pulmonic valve was grossly normal. Pulmonic valve regurgitation is  not visualized.  12. The interatrial septum was not well visualized.  13. Very poor acoustic windows due to patient's body habitus. LV and RV  function are grossly  normal. No obvious valvular abnormalities noted.   Myoview 2018:   Nuclear stress EF: 64%. Normal wall motion  There was no ST segment deviation noted during stress.  No rest images obtained. Stress images do not show any perfusion defects.  Based upon stress imaging, stress test is low risk no perfusion defects, normal ejection fraction    EKG:  No EKG today  Recent Labs: 01/22/2020:  ALT 43; Platelets 133.0 02/17/2020: Magnesium 1.3 04/07/2020: TSH 1.10 10/05/2020: BUN 17; Creatinine, Ser 1.30; Hemoglobin 13.3; Potassium 3.5; Sodium 141  Recent Lipid Panel    Component Value Date/Time   CHOL 94 01/22/2020 0728   TRIG (H) 01/22/2020 0728    508.0 Triglyceride is over 400; calculations on Lipids are invalid.   HDL 26.20 (L) 01/22/2020 0728   CHOLHDL 4 01/22/2020 0728   VLDL 59.6 (H) 10/15/2018 0929   LDLCALC 44 10/06/2017 1017   LDLDIRECT 35.0 01/22/2020 0728      Physical Exam:    VS:  BP 128/76   Pulse 81   Ht 5' 6.5" (1.689 m)   Wt 262 lb 9.6 oz (119.1 kg)   SpO2 95%   BMI 41.75 kg/m     Wt Readings from Last 3 Encounters:  11/24/20 262 lb 9.6 oz (119.1 kg)  11/10/20 264 lb 1 oz (119.8 kg)  09/28/20 269 lb 6.4 oz (122.2 kg)     GEN:  Well nourished, well developed in no acute distress HEENT: Normal NECK: No JVD; No carotid bruits CARDIAC: RRR, soft 1/6 systolic murmur at RUSB. No rubs, gallops RESPIRATORY:  Clear to auscultation without rales, wheezing or rhonchi  ABDOMEN: Soft, non-tender, non-distended MUSCULOSKELETAL:  No edema; No deformity  SKIN: Warm and dry NEUROLOGIC:  Alert and oriented x 3 PSYCHIATRIC:  Normal affect   ASSESSMENT:    1. Chronic diastolic heart failure (HCC)   2. Dyslipidemia associated with type 2 diabetes mellitus (HCC)   3. Hypertension, unspecified type   4. Morbid obesity (HCC)   5. Type 2 diabetes mellitus with other specified complication, with long-term current use of insulin (HCC)   6. H/O liver transplant (HCC)     PLAN:    In order of problems listed above:  #Chronic Diastolic Heart Failure: Doing well and euvolemic on examination. Currently with NYHA class II symptoms. Blood pressure well controlled.  -Continue lasix 40mg daily -Continue jardiance 10mg daily -Continue losartan 50mg daily -Not on spironolactone due to gynecomastia -Compression socks for LE edema -Low Na diet  #HTN: Controlled and at gaol <120s/80s.  -Continue losartan 50mg daily  #Alcoholic cirrhosis s/p liver transplant in 2017: Followed by Specialist in [place].  -Continue immunosuppressants per primary team  #DMII on Insulin: -Continue insulin (lantus 100units qAM), metformin 500mg daily -Continue jardiance 10mg daily  #Obesity: BMI 41. Discussed diet and exercise. Admits to high sugar intake. He is trying to work on it.  -Discussed mediterranean diet -Goal 150min of exercise per week as tolerated  #HLD: LDL well controlled at 44, HDL 25, TG 128 in 12/2019. -Continue lovastatin 20mg daily     Medication Adjustments/Labs and Tests Ordered: Current medicines are reviewed at length with the patient today.  Concerns regarding medicines are outlined above.  No orders of the defined types were placed in this encounter.  Meds ordered this encounter  Medications  . furosemide (LASIX) 40 MG tablet    Sig: Take 1 tablet (40 mg total) by mouth daily.    Dispense:  90 tablet    Refill:  3    Patient Instructions   Medication Instructions:   Your physician recommends that you continue on your current medications as directed. Please refer to the Current Medication list given to you today.  *If you need a refill on your cardiac medications before your next appointment, please call your pharmacy*   Follow-Up: At [facility], you and your health needs are our priority.  As   part of our continuing mission to provide you with exceptional heart care, we have created designated Provider Care Teams.  These Care  Teams include your primary Cardiologist (physician) and Advanced Practice Providers (APPs -  Physician Assistants and Nurse Practitioners) who all work together to provide you with the care you need, when you need it.  We recommend signing up for the patient portal called "MyChart".  Sign up information is provided on this After Visit Summary.  MyChart is used to connect with patients for Virtual Visits (Telemedicine).  Patients are able to view lab/test results, encounter notes, upcoming appointments, etc.  Non-urgent messages can be sent to your provider as well.   To learn more about what you can do with MyChart, go to [email or web address].    Your next appointment:   6 month(s)  The format for your next appointment:   In Person  Provider:   You will see one of the following Advanced Practice Providers on your designated Care Team:    Scott Weaver, PA-C  Vin Bhagat, PA-C  DAYNA DUNN PA-C  MICHELE LENZE PA-C  LAURA INGOLD NP  JILL MCDANIEL NP   Other Instructions    Mediterranean Diet A Mediterranean diet refers to food and lifestyle choices that are based on the traditions of countries located on the [place]. This way of eating has been shown to help prevent certain conditions and improve outcomes for people who have chronic diseases, like kidney disease and heart disease. What are tips for following this plan? Lifestyle  Cook and eat meals together with your family, when possible.  Drink enough fluid to keep your urine clear or pale yellow.  Be physically active every day. This includes: ? Aerobic exercise like running or swimming. ? Leisure activities like gardening, walking, or housework.  Get 7-8 hours of sleep each night.  If recommended by your health care provider, drink red wine in moderation. This means 1 glass a day for nonpregnant women and 2 glasses a day for men. A glass of wine equals 5 oz (150 mL). Reading food labels  Check the serving  size of packaged foods. For foods such as rice and pasta, the serving size refers to the amount of cooked product, not dry.  Check the total fat in packaged foods. Avoid foods that have saturated fat or trans fats.  Check the ingredients list for added sugars, such as corn syrup.   Shopping  At the grocery store, buy most of your food from the areas near the walls of the store. This includes: ? Fresh fruits and vegetables (produce). ? Grains, beans, nuts, and seeds. Some of these may be available in unpackaged forms or large amounts (in bulk). ? Fresh seafood. ? Poultry and eggs. ? Low-fat dairy products.  Buy whole ingredients instead of prepackaged foods.  Buy fresh fruits and vegetables in-season from local farmers markets.  Buy frozen fruits and vegetables in resealable bags.  If you do not have access to quality fresh seafood, buy precooked frozen shrimp or canned fish, such as tuna, salmon, or sardines.  Buy small amounts of raw or cooked vegetables, salads, or olives from the deli or salad bar at your store.  Stock your pantry so you always have certain foods on hand, such as olive oil, canned tuna, canned tomatoes, rice, pasta, and beans. Cooking  Cook foods with extra-virgin olive oil instead of using butter or other vegetable oils.  Have meat as a side dish, and have vegetables or grains   as your main dish. This means having meat in small portions or adding small amounts of meat to foods like pasta or stew.  Use beans or vegetables instead of meat in common dishes like chili or lasagna.  Experiment with different cooking methods. Try roasting or broiling vegetables instead of steaming or sauteing them.  Add frozen vegetables to soups, stews, pasta, or rice.  Add nuts or seeds for added healthy fat at each meal. You can add these to yogurt, salads, or vegetable dishes.  Marinate fish or vegetables using olive oil, lemon juice, garlic, and fresh herbs. Meal  planning  Plan to eat 1 vegetarian meal one day each week. Try to work up to 2 vegetarian meals, if possible.  Eat seafood 2 or more times a week.  Have healthy snacks readily available, such as: ? Vegetable sticks with hummus. ? Greek yogurt. ? Fruit and nut trail mix.  Eat balanced meals throughout the week. This includes: ? Fruit: 2-3 servings a day ? Vegetables: 4-5 servings a day ? Low-fat dairy: 2 servings a day ? Fish, poultry, or lean meat: 1 serving a day ? Beans and legumes: 2 or more servings a week ? Nuts and seeds: 1-2 servings a day ? Whole grains: 6-8 servings a day ? Extra-virgin olive oil: 3-4 servings a day  Limit red meat and sweets to only a few servings a month   What are my food choices?  Mediterranean diet ? Recommended  Grains: Whole-grain pasta. Brown rice. Bulgar wheat. Polenta. Couscous. Whole-wheat bread. Oatmeal. Quinoa.  Vegetables: Artichokes. Beets. Broccoli. Cabbage. Carrots. Eggplant. Green beans. Chard. Kale. Spinach. Onions. Leeks. Peas. Squash. Tomatoes. Peppers. Radishes.  Fruits: Apples. Apricots. Avocado. Berries. Bananas. Cherries. Dates. Figs. Grapes. Lemons. Melon. Oranges. Peaches. Plums. Pomegranate.  Meats and other protein foods: Beans. Almonds. Sunflower seeds. Pine nuts. Peanuts. Cod. Salmon. Scallops. Shrimp. Tuna. Tilapia. Clams. Oysters. Eggs.  Dairy: Low-fat milk. Cheese. Greek yogurt.  Beverages: Water. Red wine. Herbal tea.  Fats and oils: Extra virgin olive oil. Avocado oil. Grape seed oil.  Sweets and desserts: Greek yogurt with honey. Baked apples. Poached pears. Trail mix.  Seasoning and other foods: Basil. Cilantro. Coriander. Cumin. Mint. Parsley. Sage. Rosemary. Tarragon. Garlic. Oregano. Thyme. Pepper. Balsalmic vinegar. Tahini. Hummus. Tomato sauce. Olives. Mushrooms. ? Limit these  Grains: Prepackaged pasta or rice dishes. Prepackaged cereal with added sugar.  Vegetables: Deep fried potatoes (french  fries).  Fruits: Fruit canned in syrup.  Meats and other protein foods: Beef. Pork. Lamb. Poultry with skin. Hot dogs. Bacon.  Dairy: Ice cream. Sour cream. Whole milk.  Beverages: Juice. Sugar-sweetened soft drinks. Beer. Liquor and spirits.  Fats and oils: Butter. Canola oil. Vegetable oil. Beef fat (tallow). Lard.  Sweets and desserts: Cookies. Cakes. Pies. Candy.  Seasoning and other foods: Mayonnaise. Premade sauces and marinades. The items listed may not be a complete list. Talk with your dietitian about what dietary choices are right for you. Summary  The Mediterranean diet includes both food and lifestyle choices.  Eat a variety of fresh fruits and vegetables, beans, nuts, seeds, and whole grains.  Limit the amount of red meat and sweets that you eat.  Talk with your health care provider about whether it is safe for you to drink red wine in moderation. This means 1 glass a day for nonpregnant women and 2 glasses a day for men. A glass of wine equals 5 oz (150 mL). This information is not intended to replace advice given to you   by your health care provider. Make sure you discuss any questions you have with your health care provider. Document Revised: 02/11/2016 Document Reviewed: 02/04/2016 Elsevier Patient Education  2020 Else[employer] Inc[employer]  Mediterranean Diet A Mediterranean diet refers to food and lifestyle choices that are based on the traditions of countries located on the [place] Sea. This way of eating has been shown to help prevent certain conditions and improve outcomes for people who have chronic diseases, like kidney disease and heart disease. What are tips for following this plan? Lifestyle  Cook and eat meals together with your family, when possible.  Drink enough fluid to keep your urine clear or pale yellow.  Be physically active every day. This includes: ? Aerobic exercise like running or swimming. ? Leisure activities like gardening, walking, or  housework.  Get 7-8 hours of sleep each night.  If recommended by your health care provider, drink red wine in moderation. This means 1 glass a day for nonpregnant women and 2 glasses a day for men. A glass of wine equals 5 oz (150 mL). Reading food labels  Check the serving size of packaged foods. For foods such as rice and pasta, the serving size refers to the amount of cooked product, not dry.  Check the total fat in packaged foods. Avoid foods that have saturated fat or trans fats.  Check the ingredients list for added sugars, such as corn syrup.   Shopping  At the grocery store, buy most of your food from the areas near the walls of the store. This includes: ? Fresh fruits and vegetables (produce). ? Grains, beans, nuts, and seeds. Some of these may be available in unpackaged forms or large amounts (in bulk). ? Fresh seafood. ? Poultry and eggs. ? Low-fat dairy products.  Buy whole ingredients instead of prepackaged foods.  Buy fresh fruits and vegetables in-season from local farmers markets.  Buy frozen fruits and vegetables in resealable bags.  If you do not have access to quality fresh seafood, buy precooked frozen shrimp or canned fish, such as tuna, salmon, or sardines.  Buy small amounts of raw or cooked vegetables, salads, or olives from the deli or salad bar at your store.  Stock your pantry so you always have certain foods on hand, such as olive oil, canned tuna, canned tomatoes, rice, pasta, and beans. Cooking  Cook foods with extra-virgin olive oil instead of using butter or other vegetable oils.  Have meat as a side dish, and have vegetables or grains as your main dish. This means having meat in small portions or adding small amounts of meat to foods like pasta or stew.  Use beans or vegetables instead of meat in common dishes like chili or lasagna.  Experiment with different cooking methods. Try roasting or broiling vegetables instead of steaming or sauteing  them.  Add frozen vegetables to soups, stews, pasta, or rice.  Add nuts or seeds for added healthy fat at each meal. You can add these to yogurt, salads, or vegetable dishes.  Marinate fish or vegetables using olive oil, lemon juice, garlic, and fresh herbs. Meal planning  Plan to eat 1 vegetarian meal one day each week. Try to work up to 2 vegetarian meals, if possible.  Eat seafood 2 or more times a week.  Have healthy snacks readily available, such as: ? Vegetable sticks with hummus. ? Greek yogurt. ? Fruit and nut trail mix.  Eat balanced meals throughout the week. This includes: ? Fruit: 2-3 servings a day ?   Vegetables: 4-5 servings a day ? Low-fat dairy: 2 servings a day ? Fish, poultry, or lean meat: 1 serving a day ? Beans and legumes: 2 or more servings a week ? Nuts and seeds: 1-2 servings a day ? Whole grains: 6-8 servings a day ? Extra-virgin olive oil: 3-4 servings a day  Limit red meat and sweets to only a few servings a month   What are my food choices?  Mediterranean diet ? Recommended  Grains: Whole-grain pasta. Brown rice. Bulgar wheat. Polenta. Couscous. Whole-wheat bread. Oatmeal. Quinoa.  Vegetables: Artichokes. Beets. Broccoli. Cabbage. Carrots. Eggplant. Green beans. Chard. Kale. Spinach. Onions. Leeks. Peas. Squash. Tomatoes. Peppers. Radishes.  Fruits: Apples. Apricots. Avocado. Berries. Bananas. Cherries. Dates. Figs. Grapes. Lemons. Melon. Oranges. Peaches. Plums. Pomegranate.  Meats and other protein foods: Beans. Almonds. Sunflower seeds. Pine nuts. Peanuts. Cod. Salmon. Scallops. Shrimp. Tuna. Tilapia. Clams. Oysters. Eggs.  Dairy: Low-fat milk. Cheese. Greek yogurt.  Beverages: Water. Red wine. Herbal tea.  Fats and oils: Extra virgin olive oil. Avocado oil. Grape seed oil.  Sweets and desserts: Greek yogurt with honey. Baked apples. Poached pears. Trail mix.  Seasoning and other foods: Basil. Cilantro. Coriander. Cumin. Mint. Parsley.  Sage. Rosemary. Tarragon. Garlic. Oregano. Thyme. Pepper. Balsalmic vinegar. Tahini. Hummus. Tomato sauce. Olives. Mushrooms. ? Limit these  Grains: Prepackaged pasta or rice dishes. Prepackaged cereal with added sugar.  Vegetables: Deep fried potatoes (french fries).  Fruits: Fruit canned in syrup.  Meats and other protein foods: Beef. Pork. Lamb. Poultry with skin. Hot dogs. Bacon.  Dairy: Ice cream. Sour cream. Whole milk.  Beverages: Juice. Sugar-sweetened soft drinks. Beer. Liquor and spirits.  Fats and oils: Butter. Canola oil. Vegetable oil. Beef fat (tallow). Lard.  Sweets and desserts: Cookies. Cakes. Pies. Candy.  Seasoning and other foods: Mayonnaise. Premade sauces and marinades. The items listed may not be a complete list. Talk with your dietitian about what dietary choices are right for you. Summary  The Mediterranean diet includes both food and lifestyle choices.  Eat a variety of fresh fruits and vegetables, beans, nuts, seeds, and whole grains.  Limit the amount of red meat and sweets that you eat.  Talk with your health care provider about whether it is safe for you to drink red wine in moderation. This means 1 glass a day for nonpregnant women and 2 glasses a day for men. A glass of wine equals 5 oz (150 mL). This information is not intended to replace advice given to you by your health care provider. Make sure you discuss any questions you have with your health care provider. Document Revised: 02/11/2016 Document Reviewed: 02/04/2016 Elsevier Patient Education  2020 Elsevier Inc.     Signed, Heather E Pemberton, MD  11/24/2020 10:01 AM    [facility] 

## 2020-11-24 ENCOUNTER — Ambulatory Visit: Payer: Medicare HMO | Admitting: Cardiology

## 2020-11-24 ENCOUNTER — Other Ambulatory Visit: Payer: Self-pay

## 2020-11-24 ENCOUNTER — Encounter: Payer: Self-pay | Admitting: Cardiology

## 2020-11-24 VITALS — BP 128/76 | HR 81 | Ht 66.5 in | Wt 262.6 lb

## 2020-11-24 DIAGNOSIS — I5032 Chronic diastolic (congestive) heart failure: Secondary | ICD-10-CM

## 2020-11-24 DIAGNOSIS — I10 Essential (primary) hypertension: Secondary | ICD-10-CM

## 2020-11-24 DIAGNOSIS — E785 Hyperlipidemia, unspecified: Secondary | ICD-10-CM | POA: Diagnosis not present

## 2020-11-24 DIAGNOSIS — Z944 Liver transplant status: Secondary | ICD-10-CM

## 2020-11-24 DIAGNOSIS — Z794 Long term (current) use of insulin: Secondary | ICD-10-CM

## 2020-11-24 DIAGNOSIS — E1169 Type 2 diabetes mellitus with other specified complication: Secondary | ICD-10-CM

## 2020-11-24 MED ORDER — FUROSEMIDE 40 MG PO TABS: 40.0000 mg | ORAL_TABLET | Freq: Every day | ORAL | 3 refills | Status: DC

## 2020-11-24 NOTE — Patient Instructions (Addendum)
Medication Instructions:   Your physician recommends that you continue on your current medications as directed. Please refer to the Current Medication list given to you today.  *If you need a refill on your cardiac medications before your next appointment, please call your pharmacy*   Follow-Up: At [facility], you and your health needs are our priority.  As part of our continuing mission to provide you with exceptional heart care, we have created designated Provider Care Teams.  These Care Teams include your primary Cardiologist (physician) and Advanced Practice Providers (APPs -  Physician Assistants and Nurse Practitioners) who all work together to provide you with the care you need, when you need it.  We recommend signing up for the patient portal called "MyChart".  Sign up information is provided on this After Visit Summary.  MyChart is used to connect with patients for Virtual Visits (Telemedicine).  Patients are able to view lab/test results, encounter notes, upcoming appointments, etc.  Non-urgent messages can be sent to your provider as well.   To learn more about what you can do with MyChart, go to [email or web address].    Your next appointment:   6 month(s)  The format for your next appointment:   In Person  Provider:   You will see one of the following Advanced Practice Providers on your designated Care Team:    Richardson Dopp, PA-C  Vin [place], PA-C  DAYNA DUNN PA-C  MICHELE LENZE PA-C  Cecilie Kicks NP  [place] NP   Other Instructions    [place] refers to food and lifestyle choices that are based on the traditions of countries located on the [employer]. This way of eating has been shown to help prevent certain conditions and improve outcomes for people who have chronic diseases, like kidney disease and heart disease. What are tips for following this plan? Lifestyle  Cook and eat meals together with your family,  when possible.  Drink enough fluid to keep your urine clear or pale yellow.  Be physically active every day. This includes: ? Aerobic exercise like running or swimming. ? Leisure activities like gardening, walking, or housework.  Get 7-8 hours of sleep each night.  If recommended by your health care provider, drink red wine in moderation. This means 1 glass a day for nonpregnant women and 2 glasses a day for men. A glass of wine equals 5 oz (150 mL). Reading food labels  Check the serving size of packaged foods. For foods such as rice and pasta, the serving size refers to the amount of cooked product, not dry.  Check the total fat in packaged foods. Avoid foods that have saturated fat or trans fats.  Check the ingredients list for added sugars, such as corn syrup.   Shopping  At the grocery store, buy most of your food from the areas near the walls of the store. This includes: ? Fresh fruits and vegetables (produce). ? Grains, beans, nuts, and seeds. Some of these may be available in unpackaged forms or large amounts (in bulk). ? Fresh seafood. ? Poultry and eggs. ? Low-fat dairy products.  Buy whole ingredients instead of prepackaged foods.  Buy fresh fruits and vegetables in-season from local farmers markets.  Buy frozen fruits and vegetables in resealable bags.  If you do not have access to quality fresh seafood, buy precooked frozen shrimp or canned fish, such as tuna, salmon, or sardines.  Buy small amounts of raw or cooked vegetables, salads, or olives from  the deli or salad bar at your store.  Stock your pantry so you always have certain foods on hand, such as olive oil, canned tuna, canned tomatoes, rice, pasta, and beans. Cooking  Cook foods with extra-virgin olive oil instead of using butter or other vegetable oils.  Have meat as a side dish, and have vegetables or grains as your main dish. This means having meat in small portions or adding small amounts of meat to  foods like pasta or stew.  Use beans or vegetables instead of meat in common dishes like chili or lasagna.  Experiment with different cooking methods. Try roasting or broiling vegetables instead of steaming or sauteing them.  Add frozen vegetables to soups, stews, pasta, or rice.  Add nuts or seeds for added healthy fat at each meal. You can add these to yogurt, salads, or vegetable dishes.  Marinate fish or vegetables using olive oil, lemon juice, garlic, and fresh herbs. Meal planning  Plan to eat 1 vegetarian meal one day each week. Try to work up to 2 vegetarian meals, if possible.  Eat seafood 2 or more times a week.  Have healthy snacks readily available, such as: ? Vegetable sticks with hummus. ? [place] yogurt. ? Fruit and nut trail mix.  Eat balanced meals throughout the week. This includes: ? Fruit: 2-3 servings a day ? Vegetables: 4-5 servings a day ? Low-fat dairy: 2 servings a day ? Fish, poultry, or lean meat: 1 serving a day ? Beans and legumes: 2 or more servings a week ? Nuts and seeds: 1-2 servings a day ? Whole grains: 6-8 servings a day ? Extra-virgin olive oil: 3-4 servings a day  Limit red meat and sweets to only a few servings a month   What are my food choices?  Mediterranean diet ? Recommended  Grains: Whole-grain pasta. Brown rice. Bulgar wheat. Polenta. Couscous. Whole-wheat bread. [place].  Vegetables: Artichokes. Beets. Broccoli. Cabbage. Carrots. Eggplant. Green beans. Chard. Kale. Spinach. Onions. Leeks. Peas. Squash. Tomatoes. Peppers. Radishes.  Fruits: Apples. Apricots. Avocado. Berries. Bananas. Cherries. Dates. Figs. Grapes. Lemons. Melon. Oranges. Peaches. Plums. Pomegranate.  Meats and other protein foods: Beans. Almonds. Sunflower seeds. Pine nuts. Peanuts. [place]. Salmon. Scallops. Shrimp. [place]. Tilapia. Clams. Oysters. Eggs.  Dairy: Low-fat milk. Cheese. Greek yogurt.  Beverages: Water. Red wine. Herbal tea.  Fats and  oils: Extra virgin olive oil. Avocado oil. Grape seed oil.  Sweets and desserts: [place] yogurt with honey. Baked apples. Poached pears. Trail mix.  Seasoning and other foods: Basil. Cilantro. Coriander. Cumin. Mint. Parsley. Sage. Rosemary. Tarragon. Garlic. Oregano. Thyme. Pepper. Balsalmic vinegar. Tahini. Hummus. Tomato sauce. Olives. Mushrooms. ? Limit these  Grains: Prepackaged pasta or rice dishes. Prepackaged cereal with added sugar.  Vegetables: Deep fried potatoes (french fries).  Fruits: Fruit canned in syrup.  Meats and other protein foods: Beef. Pork. Lamb. Poultry with skin. Hot dogs. Berniece Salines.  Dairy: Ice cream. Sour cream. Whole milk.  Beverages: Juice. Sugar-sweetened soft drinks. Beer. Liquor and spirits.  Fats and oils: Butter. Canola oil. Vegetable oil. Beef fat (tallow). Lard.  Sweets and desserts: Cookies. Cakes. Pies. Candy.  Seasoning and other foods: Mayonnaise. Premade sauces and marinades. The items listed may not be a complete list. Talk with your dietitian about what dietary choices are right for you. Summary  The Mediterranean diet includes both food and lifestyle choices.  Eat a variety of fresh fruits and vegetables, beans, nuts, seeds, and whole grains.  Limit the amount of red meat and  sweets that you eat.  Talk with your health care provider about whether it is safe for you to drink red wine in moderation. This means 1 glass a day for nonpregnant women and 2 glasses a day for men. A glass of wine equals 5 oz (150 mL). This information is not intended to replace advice given to you by your health care provider. Make sure you discuss any questions you have with your health care provider. Document Revised: 02/11/2016 Document Reviewed: 02/04/2016 Elsevier Patient Education  [place] refers to food and lifestyle choices that are based on the traditions of countries located on the [employer].  This way of eating has been shown to help prevent certain conditions and improve outcomes for people who have chronic diseases, like kidney disease and heart disease. What are tips for following this plan? Lifestyle  Cook and eat meals together with your family, when possible.  Drink enough fluid to keep your urine clear or pale yellow.  Be physically active every day. This includes: ? Aerobic exercise like running or swimming. ? Leisure activities like gardening, walking, or housework.  Get 7-8 hours of sleep each night.  If recommended by your health care provider, drink red wine in moderation. This means 1 glass a day for nonpregnant women and 2 glasses a day for men. A glass of wine equals 5 oz (150 mL). Reading food labels  Check the serving size of packaged foods. For foods such as rice and pasta, the serving size refers to the amount of cooked product, not dry.  Check the total fat in packaged foods. Avoid foods that have saturated fat or trans fats.  Check the ingredients list for added sugars, such as corn syrup.   Shopping  At the grocery store, buy most of your food from the areas near the walls of the store. This includes: ? Fresh fruits and vegetables (produce). ? Grains, beans, nuts, and seeds. Some of these may be available in unpackaged forms or large amounts (in bulk). ? Fresh seafood. ? Poultry and eggs. ? Low-fat dairy products.  Buy whole ingredients instead of prepackaged foods.  Buy fresh fruits and vegetables in-season from local farmers markets.  Buy frozen fruits and vegetables in resealable bags.  If you do not have access to quality fresh seafood, buy precooked frozen shrimp or canned fish, such as tuna, salmon, or sardines.  Buy small amounts of raw or cooked vegetables, salads, or olives from the deli or salad bar at your store.  Stock your pantry so you always have certain foods on hand, such as olive oil, canned tuna, canned tomatoes, rice,  pasta, and beans. Cooking  Cook foods with extra-virgin olive oil instead of using butter or other vegetable oils.  Have meat as a side dish, and have vegetables or grains as your main dish. This means having meat in small portions or adding small amounts of meat to foods like pasta or stew.  Use beans or vegetables instead of meat in common dishes like chili or lasagna.  Experiment with different cooking methods. Try roasting or broiling vegetables instead of steaming or sauteing them.  Add frozen vegetables to soups, stews, pasta, or rice.  Add nuts or seeds for added healthy fat at each meal. You can add these to yogurt, salads, or vegetable dishes.  Marinate fish or vegetables using olive oil, lemon juice, garlic, and fresh herbs. Meal planning  Plan to eat 1 vegetarian meal  one day each week. Try to work up to 2 vegetarian meals, if possible.  Eat seafood 2 or more times a week.  Have healthy snacks readily available, such as: ? Vegetable sticks with hummus. ? [place] yogurt. ? Fruit and nut trail mix.  Eat balanced meals throughout the week. This includes: ? Fruit: 2-3 servings a day ? Vegetables: 4-5 servings a day ? Low-fat dairy: 2 servings a day ? Fish, poultry, or lean meat: 1 serving a day ? Beans and legumes: 2 or more servings a week ? Nuts and seeds: 1-2 servings a day ? Whole grains: 6-8 servings a day ? Extra-virgin olive oil: 3-4 servings a day  Limit red meat and sweets to only a few servings a month   What are my food choices?  Mediterranean diet ? Recommended  Grains: Whole-grain pasta. Brown rice. Bulgar wheat. Polenta. Couscous. Whole-wheat bread. [place].  Vegetables: Artichokes. Beets. Broccoli. Cabbage. Carrots. Eggplant. Green beans. Chard. Kale. Spinach. Onions. Leeks. Peas. Squash. Tomatoes. Peppers. Radishes.  Fruits: Apples. Apricots. Avocado. Berries. Bananas. Cherries. Dates. Figs. Grapes. Lemons. Melon. Oranges. Peaches. Plums.  Pomegranate.  Meats and other protein foods: Beans. Almonds. Sunflower seeds. Pine nuts. Peanuts. [place]. Salmon. Scallops. Shrimp. [place]. Tilapia. Clams. Oysters. Eggs.  Dairy: Low-fat milk. Cheese. Greek yogurt.  Beverages: Water. Red wine. Herbal tea.  Fats and oils: Extra virgin olive oil. Avocado oil. Grape seed oil.  Sweets and desserts: [place] yogurt with honey. Baked apples. Poached pears. Trail mix.  Seasoning and other foods: Basil. Cilantro. Coriander. Cumin. Mint. Parsley. Sage. Rosemary. Tarragon. Garlic. Oregano. Thyme. Pepper. Balsalmic vinegar. Tahini. Hummus. Tomato sauce. Olives. Mushrooms. ? Limit these  Grains: Prepackaged pasta or rice dishes. Prepackaged cereal with added sugar.  Vegetables: Deep fried potatoes (french fries).  Fruits: Fruit canned in syrup.  Meats and other protein foods: Beef. Pork. Lamb. Poultry with skin. Hot dogs. Berniece Salines.  Dairy: Ice cream. Sour cream. Whole milk.  Beverages: Juice. Sugar-sweetened soft drinks. Beer. Liquor and spirits.  Fats and oils: Butter. Canola oil. Vegetable oil. Beef fat (tallow). Lard.  Sweets and desserts: Cookies. Cakes. Pies. Candy.  Seasoning and other foods: Mayonnaise. Premade sauces and marinades. The items listed may not be a complete list. Talk with your dietitian about what dietary choices are right for you. Summary  The Mediterranean diet includes both food and lifestyle choices.  Eat a variety of fresh fruits and vegetables, beans, nuts, seeds, and whole grains.  Limit the amount of red meat and sweets that you eat.  Talk with your health care provider about whether it is safe for you to drink red wine in moderation. This means 1 glass a day for nonpregnant women and 2 glasses a day for men. A glass of wine equals 5 oz (150 mL). This information is not intended to replace advice given to you by your health care provider. Make sure you discuss any questions you have with your health care  provider. Document Revised: 02/11/2016 Document Reviewed: 02/04/2016 Elsevier Patient Education  [place].

## 2020-11-30 ENCOUNTER — Telehealth: Payer: Self-pay

## 2020-11-30 NOTE — Progress Notes (Signed)
Entered in error

## 2020-11-30 NOTE — Chronic Care Management (AMB) (Addendum)
Chronic Care Management Pharmacy Assistant   Name: Colton Porter  MRN: [id number] DOB: [birth year]  Reason for Encounter: Disease State- Diabetes   Recent office visits:  None since last CCM contact   Recent consult visits:  11/24/20- Cardiology- No medication changes. Discussed diet and exercise.   Hospital visits:  10/05/20 ED visit for finger laceration. No admission.   Medications: Outpatient Encounter Medications as of 11/30/2020  Medication Sig   Accu-Chek FastClix Lancets MISC Use three times daily to check sugars E65.11, insulin use   acetaminophen (TYLENOL) 500 MG tablet Take 500 mg by mouth every 6 (six) hours as needed for mild pain.   allopurinol (ZYLOPRIM) 300 MG tablet Take 1 tablet (300 mg total) by mouth daily.   ASPIR-LOW 81 MG EC tablet Take 1 tablet (81 mg total) by mouth daily.   B-D UF III MINI PEN NEEDLES 31G X 5 MM MISC USE TO INJECT INSULIN DAILY   Blood Glucose Monitoring Suppl (ACCU-CHEK GUIDE) w/Device KIT 1 Units by Does not apply route as directed.   Cholecalciferol (VITAMIN D3) 2000 units TABS Take 1 tablet by mouth at bedtime.   docusate sodium (COLACE) 100 MG capsule Take 100 mg by mouth daily as needed for mild constipation.    empagliflozin (JARDIANCE) 10 MG TABS tablet Take 1 tablet (10 mg total) by mouth daily before breakfast.   escitalopram (LEXAPRO) 10 MG tablet TAKE 1 TABLET EVERY DAY   fluticasone (FLONASE) 50 MCG/ACT nasal spray SPRAY 2 SPRAYS INTO EACH NOSTRIL EVERY DAY   furosemide (LASIX) 40 MG tablet Take 1 tablet (40 mg total) by mouth daily.   glucose blood (ACCU-CHEK GUIDE) test strip Check sugars three times daily and as needed when feeling ill E11.65, insulin use   glucose blood test strip Use as instructed to check sugars three times daily E11.65, insulin use   insulin glargine (LANTUS SOLOSTAR) 100 UNIT/ML Solostar Pen Inject 100 Units into the skin every morning. And pen needles 1/day   loratadine (CLARITIN) 10 MG tablet Take  10 mg by mouth daily.   losartan (COZAAR) 50 MG tablet Take 1 tablet (50 mg total) by mouth daily.   lovastatin (MEVACOR) 20 MG tablet Take 1 tablet (20 mg total) by mouth at bedtime.   Magnesium 500 MG TABS Take 1 tablet (500 mg total) by mouth every Monday, Wednesday, and Friday.   metFORMIN (GLUCOPHAGE-XR) 500 MG 24 hr tablet TAKE 1 TABLET BY MOUTH EVERY DAY WITH BREAKFAST   Multiple Vitamins-Minerals ([facility] SPECTRAVITE PO) Take 1 tablet by mouth at bedtime.   mycophenolate (MYFORTIC) 360 MG TBEC EC tablet Take 360 mg by mouth 2 (two) times daily.   Omega-3 Fatty Acids (FISH OIL PO) Take 1 tablet by mouth at bedtime.   omeprazole (PRILOSEC) 40 MG capsule TAKE 1 CAPSULE EVERY DAY   oxyCODONE (ROXICODONE) 5 MG immediate release tablet 1-2 tabs PO q6 hours prn pain   tacrolimus (PROGRAF) 1 MG capsule Take 1-2 mg by mouth See admin instructions. Take 2 mg by mouth in the morning, then take 1 mg  by mouth in the evening   No facility-administered encounter medications on file as of 11/30/2020.     Recent Relevant Labs: Lab Results  Component Value Date/Time   HGBA1C 9.2 (A) 09/28/2020 08:45 AM   HGBA1C 10.6 (A) 07/23/2020 08:09 AM   HGBA1C 10.6 (H) 01/22/2020 07:28 AM   HGBA1C 10.1 (H) 07/18/2019 11:33 AM   MICROALBUR <0.7 08/27/2014 09:51 AM  Kidney Function Lab Results  Component Value Date/Time   CREATININE 1.30 (H) 10/05/2020 07:56 PM   CREATININE 1.17 04/07/2020 08:39 AM   CREATININE 2.06 (H) 06/14/2016 08:56 AM   CREATININE 1.51 (H) 06/08/2016 12:06 PM   CREATININE 0.9 08/02/2012 08:39 AM   GFR 70.48 04/07/2020 08:39 AM   GFRNONAA >60 08/02/2019 01:09 AM   GFRAA >60 08/02/2019 01:09 AM    Current antihyperglycemic regimen:  Metformin $RemoveBe'500mg'[id number]$  ER take one tablet by mouth daily  Empagliflozin $RemoveBefore'10mg'[id number]$  take one tablet by mouth daily before breakfast Insulin glargine 100 units daily in the morning subcutaneously    Patient verbally confirms he is taking the above medications as  directed. Yes  What recent interventions/DTPs have been made to improve glycemic control:  Mostly for weight control- cardiology discussed diet and 150 mins of exercise a week.   Have there been any recent hospitalizations or ED visits since last visit with CPP? No  Patient denies hypoglycemic symptoms, including Pale, Sweaty, Shaky, Hungry, Nervous/irritable and Vision changes  Patient denies hyperglycemic symptoms, including blurry vision, excessive thirst, fatigue, polyuria and weakness  How often are you checking your blood sugar? Very infrequently. Patient only checks when we call and ask him to.   What are your blood sugars ranging?  Fasting:  12/03/20 - 224 Before meals:  12/01/20- 227 After meals:  12/01/20- 254 Bedtime: N/A  On insulin? Yes  How many units: Insulin glargine 100 units daily in the morning subcutaneously  During the week, how often does your blood glucose drop below 70?  Unknown. Patient does not check BG regularly  Are you checking your feet daily/regularly? Yes- denies any wounds or sores.   Adherence Review: Is the patient currently on a STATIN medication? Yes Is the patient currently on ACE/ARB medication? Yes Does the patient have >5 day gap between last estimated fill dates? Yes, metformin   Star Rating Drugs:  Medication:  Last Fill: Day Supply Jardiance 10 mg 10/09/20 30 Losartan 50 mg 09/21/20 90 Lovastatin 20 mg 10/06/20 90 Metformin 500 mg 07/20/20 90 - patient not sure of last fill date. Will check when he gets home.   Endocrinology appointment with Dr. Loanne Drilling on 12/30/20  Patient requested refill on lancets and test strips. Patient made aware Dr. [name] [facility] AM.    Follow-Up:  Pharmacist Review  Debbora Dus, CPP notified  Time 35 min Margaretmary Dys, [place] [phone]  I have reviewed the care management and care coordination activities outlined in this encounter and I am  certifying that I agree with the content of this note. No further action required.  Debbora Dus, PharmD Clinical Pharmacist [facility] at [facility] [phone]

## 2020-12-03 ENCOUNTER — Other Ambulatory Visit: Payer: Self-pay | Admitting: Family Medicine

## 2020-12-07 ENCOUNTER — Ambulatory Visit: Payer: Medicare HMO | Admitting: Cardiology

## 2020-12-14 NOTE — Chronic Care Management (AMB) (Signed)
Left message today requesting call back to review updated readings. Will check in with patient later this week.   Debbora Dus, CPP notified  Margaretmary Dys, [place] Pharmacy Assistant [phone]

## 2020-12-18 NOTE — Chronic Care Management (AMB) (Addendum)
    Diabetes Disease State - 12/18/20  Current antihyperglycemic regimen:  Metformin 500mg  ER take one tablet by mouth daily Empagliflozin 10mg  take one tablet by mouth daily before breakfast (Patient states he is not taking at this time due to cost) Insulin glargine 100 units daily in the morning subcutaneously   Patient verbally confirms he is taking the above medications as directed. No- not taking Jardiance due to cost .  How often are you checking your blood sugar? once daily- rarely  What are your blood sugars ranging?  Fasting:  12/15/20- 307 12/16/20- 285 12/17/20- 276 12/17/20 3:45 pm - 123 (states he did not eat all day) 12/18/20- 301 Before meals: NA After meals:  12/15/20 129 12/16/20- 274 Bedtime: NA  On insulin? Yes How many units: 100 units daily  During the week, how often does your blood glucose drop below 70? Never  Patient states he has not been taking his Jardiance as his co-pay went from $45 to $160 monthly. Patient would like to apply for patient assistance. He notes he will go to [employer] to provide income verification and sign application.    Debbora Dus, CPP notified  Margaretmary Dys, [place] Pharmacy Assistant [phone]  I have reviewed the care management and care coordination activities outlined in this encounter and I am certifying that I agree with the content of this note. No further action required.  Debbora Dus, PharmD Clinical Pharmacist [facility] at [facility] [phone]

## 2020-12-30 ENCOUNTER — Ambulatory Visit: Payer: Medicare HMO | Admitting: Endocrinology

## 2021-01-06 ENCOUNTER — Telehealth: Payer: Self-pay

## 2021-01-06 NOTE — Progress Notes (Addendum)
Chronic Care Management Pharmacy Assistant   Name: Colton Porter  MRN: [id number] DOB: [birth year]  Reason for Encounter: Reminder for CCM appointment on 01/14/21    Medications: Outpatient Encounter Medications as of 01/06/2021  Medication Sig   Accu-Chek FastClix Lancets MISC USE THREE TIMES DAILY TO CHECK SUGARS E65.11, INSULIN USE   ACCU-CHEK GUIDE test strip USE AS INSTRUCTED TO CHECK SUGARS THREE TIMES DAILY E11.65, INSULIN USE   acetaminophen (TYLENOL) 500 MG tablet Take 500 mg by mouth every 6 (six) hours as needed for mild pain.   allopurinol (ZYLOPRIM) 300 MG tablet Take 1 tablet (300 mg total) by mouth daily.   ASPIR-LOW 81 MG EC tablet Take 1 tablet (81 mg total) by mouth daily.   B-D UF III MINI PEN NEEDLES 31G X 5 MM MISC USE TO INJECT INSULIN DAILY   Blood Glucose Monitoring Suppl (ACCU-CHEK GUIDE) w/Device KIT 1 Units by Does not apply route as directed.   Cholecalciferol (VITAMIN D3) 2000 units TABS Take 1 tablet by mouth at bedtime.   docusate sodium (COLACE) 100 MG capsule Take 100 mg by mouth daily as needed for mild constipation.    empagliflozin (JARDIANCE) 10 MG TABS tablet Take 1 tablet (10 mg total) by mouth daily before breakfast.   escitalopram (LEXAPRO) 10 MG tablet TAKE 1 TABLET EVERY DAY   fluticasone (FLONASE) 50 MCG/ACT nasal spray SPRAY 2 SPRAYS INTO EACH NOSTRIL EVERY DAY   furosemide (LASIX) 40 MG tablet Take 1 tablet (40 mg total) by mouth daily.   glucose blood (ACCU-CHEK GUIDE) test strip Check sugars three times daily and as needed when feeling ill E11.65, insulin use   insulin glargine (LANTUS SOLOSTAR) 100 UNIT/ML Solostar Pen Inject 100 Units into the skin every morning. And pen needles 1/day   loratadine (CLARITIN) 10 MG tablet Take 10 mg by mouth daily.   losartan (COZAAR) 50 MG tablet Take 1 tablet (50 mg total) by mouth daily.   lovastatin (MEVACOR) 20 MG tablet Take 1 tablet (20 mg total) by mouth at bedtime.   Magnesium 500 MG TABS Take  1 tablet (500 mg total) by mouth every Monday, Wednesday, and Friday.   metFORMIN (GLUCOPHAGE-XR) 500 MG 24 hr tablet TAKE 1 TABLET BY MOUTH EVERY DAY WITH BREAKFAST   Multiple Vitamins-Minerals ([employer] SPECTRAVITE PO) Take 1 tablet by mouth at bedtime.   mycophenolate (MYFORTIC) 360 MG TBEC EC tablet Take 360 mg by mouth 2 (two) times daily.   Omega-3 Fatty Acids (FISH OIL PO) Take 1 tablet by mouth at bedtime.   omeprazole (PRILOSEC) 40 MG capsule TAKE 1 CAPSULE EVERY DAY   oxyCODONE (ROXICODONE) 5 MG immediate release tablet 1-2 tabs PO q6 hours prn pain   tacrolimus (PROGRAF) 1 MG capsule Take 1-2 mg by mouth See admin instructions. Take 2 mg by mouth in the morning, then take 1 mg  by mouth in the evening   No facility-administered encounter medications on file as of 01/06/2021.   [name] was contacted to remind him of his upcoming telephone visit with Debbora Dus on 01/14/21 at 9:30. Patient was reminded to have all medications, supplements and any blood glucose and blood pressure readings available for review at appointment.   Are you having any problems with your medications? No  Do you have any concerns you like to discuss with the pharmacist? Yes- high BG readings    Star Rating Drugs: Medication:  Last Fill: Day Supply Jardiance 10 mg 12/03/20  90 Metformin  500 mg 10/18/20 90 Losartan 50 mg 12/10/20 90 Lovastatin 20 mg 10/06/20 [place], CPP notified  Margaretmary Dys, [place] Assistant [phone]  I have reviewed the care management and care coordination activities outlined in this encounter and I am certifying that I agree with the content of this note. No further action required.  Debbora Dus, PharmD Clinical Pharmacist [facility] at [facility] [phone]

## 2021-01-12 ENCOUNTER — Other Ambulatory Visit: Payer: Self-pay

## 2021-01-12 ENCOUNTER — Telehealth (INDEPENDENT_AMBULATORY_CARE_PROVIDER_SITE_OTHER): Payer: Medicare HMO | Admitting: Family Medicine

## 2021-01-12 ENCOUNTER — Encounter: Payer: Self-pay | Admitting: Family Medicine

## 2021-01-12 VITALS — Ht 66.5 in | Wt 260.0 lb

## 2021-01-12 DIAGNOSIS — G4733 Obstructive sleep apnea (adult) (pediatric): Secondary | ICD-10-CM | POA: Diagnosis not present

## 2021-01-12 DIAGNOSIS — Z9989 Dependence on other enabling machines and devices: Secondary | ICD-10-CM

## 2021-01-12 DIAGNOSIS — Z944 Liver transplant status: Secondary | ICD-10-CM | POA: Diagnosis not present

## 2021-01-12 DIAGNOSIS — J0111 Acute recurrent frontal sinusitis: Secondary | ICD-10-CM | POA: Diagnosis not present

## 2021-01-12 DIAGNOSIS — D849 Immunodeficiency, unspecified: Secondary | ICD-10-CM | POA: Diagnosis not present

## 2021-01-12 DIAGNOSIS — Z6841 Body Mass Index (BMI) 40.0 and over, adult: Secondary | ICD-10-CM

## 2021-01-12 DIAGNOSIS — E1169 Type 2 diabetes mellitus with other specified complication: Secondary | ICD-10-CM

## 2021-01-12 DIAGNOSIS — Z794 Long term (current) use of insulin: Secondary | ICD-10-CM

## 2021-01-12 MED ORDER — DOXYCYCLINE HYCLATE 100 MG PO TABS: 100.0000 mg | ORAL_TABLET | Freq: Two times a day (BID) | ORAL | 0 refills | Status: DC

## 2021-01-12 NOTE — Assessment & Plan Note (Signed)
Anticipate has developed acute sinusitis.  Will treat aggressively with doxy course given on immunosuppressant medications (previously augmentin ineffective). Discussed supportive care measures with flonase fluids and rest. In COVID pandemic, I did recommend he swab for COVID and let us know if tests positive - he has antigen swab at home.

## 2021-01-12 NOTE — Progress Notes (Signed)
Patient ID: Colton Porter, male    DOB: Oct 22, 1965, 55 y.o.   MRN: [id number]  Virtual visit completed through [employer], a video enabled telemedicine application. Due to national recommendations of social distancing due to COVID-19, a virtual visit is felt to be most appropriate for this patient at this time. Reviewed limitations, risks, security and privacy concerns of performing a virtual visit and the availability of in person appointments. I also reviewed that there may be a patient responsible charge related to this service. The patient agreed to proceed.   Patient location: home Provider location: [place] at [facility], office Persons participating in this virtual visit: patient, provider   If any vitals were documented, they were collected by patient at home unless specified below.    Ht 5' 6.5" (1.689 m)   Wt 260 lb (117.9 kg) Comment: pt reported  BMI 41.34 kg/m    CC: sinus pressure and headache Subjective:   HPI: Colton Porter is a 55 y.o. male presenting on 01/12/2021 for Acute Visit (C/o having HA's, ear/teeth pain, sinus pressure x 2-3 days.  Has taken Tylenol and Flonase.)   4d h/o progressively worsening L upper tooth ache, intermittent sinus pressure headache, L eye and forehead. Mild cough. Pounding forehead pain. Head congestion.   No fevers/chills, ST, PNdrainage, dyspnea, wheezing. No loss of taste or smell, abd pain, nausea, diarrhea.  Treating with tylenol and flonase.  Has not checked COVID swab. Has had COVID vaccine and 2 boosters.    Risk factors include obesity, uncontrolled diabetes, liver transplant patient on mycophenolate and prograf  Lab Results  Component Value Date   HGBA1C 9.2 (A) 09/28/2020  He stopped jardiance due to cost - will call endo to touch base about alternatives. Continues lantus 100u daily and metformin XR 543m daily.  Continues nightly CPAP for OSA - this causes worse sinus headache.      Relevant past medical, surgical,  family and social history reviewed and updated as indicated. Interim medical history since our last visit reviewed. Allergies and medications reviewed and updated. Outpatient Medications Prior to Visit  Medication Sig Dispense Refill   Accu-Chek FastClix Lancets MISC USE THREE TIMES DAILY TO CHECK SUGARS E65.11, INSULIN USE 102 each 12   ACCU-CHEK GUIDE test strip USE AS INSTRUCTED TO CHECK SUGARS THREE TIMES DAILY E11.65, INSULIN USE 100 strip 12   acetaminophen (TYLENOL) 500 MG tablet Take 500 mg by mouth every 6 (six) hours as needed for mild pain.     allopurinol (ZYLOPRIM) 300 MG tablet Take 1 tablet (300 mg total) by mouth daily. 90 tablet 0   ASPIR-LOW 81 MG EC tablet Take 1 tablet (81 mg total) by mouth daily. 90 tablet 3   B-D UF III MINI PEN NEEDLES 31G X 5 MM MISC USE TO INJECT INSULIN DAILY 100 each 3   Blood Glucose Monitoring Suppl (ACCU-CHEK GUIDE) w/Device KIT 1 Units by Does not apply route as directed. 1 kit 0   Cholecalciferol (VITAMIN D3) 2000 units TABS Take 1 tablet by mouth at bedtime.     docusate sodium (COLACE) 100 MG capsule Take 100 mg by mouth daily as needed for mild constipation.      escitalopram (LEXAPRO) 10 MG tablet TAKE 1 TABLET EVERY DAY 90 tablet 3   fluticasone (FLONASE) 50 MCG/ACT nasal spray SPRAY 2 SPRAYS INTO EACH NOSTRIL EVERY DAY 48 mL 0   furosemide (LASIX) 40 MG tablet Take 1 tablet (40 mg total) by mouth daily. 9[place]  tablet 3   glucose blood (ACCU-CHEK GUIDE) test strip Check sugars three times daily and as needed when feeling ill E11.65, insulin use 100 each 11   insulin glargine (LANTUS SOLOSTAR) 100 UNIT/ML Solostar Pen Inject 100 Units into the skin every morning. And pen needles 1/day 105 mL 3   loratadine (CLARITIN) 10 MG tablet Take 10 mg by mouth daily.     losartan (COZAAR) 50 MG tablet Take 1 tablet (50 mg total) by mouth daily. 30 tablet 1   lovastatin (MEVACOR) 20 MG tablet Take 1 tablet (20 mg total) by mouth at bedtime. 90 tablet 3    Magnesium 500 MG TABS Take 1 tablet (500 mg total) by mouth every Monday, Wednesday, and Friday. 30 tablet    metFORMIN (GLUCOPHAGE-XR) 500 MG 24 hr tablet TAKE 1 TABLET BY MOUTH EVERY DAY WITH BREAKFAST 90 tablet 1   Multiple Vitamins-Minerals ([employer]S SPECTRAVITE PO) Take 1 tablet by mouth at bedtime.     mycophenolate (MYFORTIC) 360 MG TBEC EC tablet Take 360 mg by mouth 2 (two) times daily.     Omega-3 Fatty Acids (FISH OIL PO) Take 1 tablet by mouth at bedtime.     omeprazole (PRILOSEC) 40 MG capsule TAKE 1 CAPSULE EVERY DAY 90 capsule 3   oxyCODONE (ROXICODONE) 5 MG immediate release tablet 1-2 tabs PO q6 hours prn pain 20 tablet 0   tacrolimus (PROGRAF) 1 MG capsule Take 1-2 mg by mouth See admin instructions. Take 2 mg by mouth in the morning, then take 1 mg  by mouth in the evening     empagliflozin (JARDIANCE) 10 MG TABS tablet Take 1 tablet (10 mg total) by mouth daily before breakfast. (Patient not taking: Reported on 01/12/2021) 90 tablet 1   No facility-administered medications prior to visit.     Per HPI unless specifically indicated in ROS section below Review of Systems Objective:  Ht 5' 6.5" (1.689 m)   Wt 260 lb (117.9 kg) Comment: pt reported  BMI 41.34 kg/m   Wt Readings from Last 3 Encounters:  01/12/21 260 lb (117.9 kg)  11/24/20 262 lb 9.6 oz (119.1 kg)  11/10/20 264 lb 1 oz (119.8 kg)       Physical exam: Gen: alert, NAD, not ill appearing Pulm: speaks in complete sentences without increased work of breathing Psych: normal mood, normal thought content      Assessment & Plan:   Problem List Items Addressed This Visit     Type 2 diabetes mellitus with other specified complication ([place]) (Chronic)   Status post liver transplant ([place]) (Chronic)   Morbid obesity with BMI of 40.0-44.9, adult ([place])   OSA on CPAP   Acute sinusitis - Primary    Anticipate has developed acute sinusitis.  Will treat aggressively with doxy course given on immunosuppressant  medications (previously augmentin ineffective). Discussed supportive care measures with flonase fluids and rest. In COVID pandemic, I did recommend he swab for COVID and let us know if tests positive - he has antigen swab at home.        Relevant Medications   doxycycline (VIBRA-TABS) 100 MG tablet   Immunosuppressed status ([place])     Meds ordered this encounter  Medications   doxycycline (VIBRA-TABS) 100 MG tablet    Sig: Take 1 tablet (100 mg total) by mouth 2 (two) times daily.    Dispense:  20 tablet    Refill:  0   No orders of the defined types were placed in this encounter.  I discussed the assessment and treatment plan with the patient. The patient was provided an opportunity to ask questions and all were answered. The patient agreed with the plan and demonstrated an understanding of the instructions. The patient was advised to call back or seek an in-person evaluation if the symptoms worsen or if the condition fails to improve as anticipated.  Follow up plan: No follow-ups on file.  Ria Bush, MD

## 2021-01-14 ENCOUNTER — Other Ambulatory Visit: Payer: Self-pay

## 2021-01-14 ENCOUNTER — Telehealth: Payer: Medicare HMO

## 2021-01-14 ENCOUNTER — Telehealth: Payer: Self-pay

## 2021-01-14 NOTE — Telephone Encounter (Signed)
Patient agrees to come in office tomorrow to fill out Jardiance assistance forms. He tolerates this well and has noticed improvement in his glucose readings. Will place forms in your box once signature is received.   Since he is already coming for Jardiance paperwork I thought we could go ahead and apply for his Trulicity. Cost was his major barrier. Would you be willing to prescribe this?  Trulicity 8.33 mg weekly. He is agreeable to start if approved for assistance.   --Fasting glucose this week running 300-350 on Lantus 102 units and metformin 500 mg daily (off Jardiance temporarily due to cost).  --Next endo visit is Sept 2022 with Dr. Loanne Drilling, pt missed appt in July.  Thanks,  Debbora Dus, PharmD Clinical Pharmacist [facility] at [facility] [phone]

## 2021-01-14 NOTE — Progress Notes (Unsigned)
Chronic Care Management Pharmacy Note  01/14/2021 Name:  Colton Porter MRN:  [id number] DOB:  [birth year]  Summary: ***  Recommendations/Changes made from today's visit: ***  Plan: ***   Subjective: Colton Porter is an 55 y.o. year old male who is a primary patient of Eustaquio Boyden, MD.  The CCM team was consulted for assistance with disease management and care coordination needs.    Engaged with patient by telephone for follow up visit in response to provider referral for pharmacy case management and/or care coordination services. He feels a little better today. Taking his doxycycline.   Consent to Services:  The patient was given information about Chronic Care Management services, agreed to services, and gave verbal consent prior to initiation of services.  Please see initial visit note for detailed documentation.   Patient Care Team: Eustaquio Boyden, MD as PCP - General (Family Medicine) Phil Dopp, [facility] as Pharmacist (Pharmacist)  Recent office visits: 01/12/21 - Acute sinusitis - Start doxycycline course. DM, off Jardiance due to cost. Continues Lantus 100 units and metformin 500 mg daily. Follow up with endocrinology.   Recent consult visits: [facility] visits: 10/05/20 - ED to hospital visit - Laceration of finger Tamario - missed endo, rescheduled for Sept 22  349 Monday morning, 305 yesterday, 300s all week. Talked to wife about the application and it was too much of a headache.  Jardiance, patient will come into sign. Come anytime tomorrow. Dr. Everardo All pretty call. Still wants to eat ice cream and live a healthy life.  102 units/day insulin He has noticed worsening of sugar since starting Jardiance.  He has not given up on sugars.  Has felt it really hard to give up sugar in diet. Drinks 1-2 glass sweet tea/day. 2 liter cheerwine/fanta/week. 1-2 cup/night. Ice cream.  Stays active in yard, not sedentary. Works on [employer],  Pharmacologist. 260 -  Trulicity ***      6 grandchildren -- lives for them.  Skips a week at a time.   Sign tomorrow. :)   Objective:  Lab Results  Component Value Date   CREATININE 1.30 (H) 10/05/2020   BUN 17 10/05/2020   GFR 70.48 04/07/2020   GFRNONAA >60 08/02/2019   GFRAA >60 08/02/2019   NA 141 10/05/2020   K 3.5 10/05/2020   CALCIUM 9.3 04/07/2020   CO2 27 04/07/2020   GLUCOSE 124 (H) 10/05/2020    Lab Results  Component Value Date/Time   HGBA1C 9.2 (A) 09/28/2020 08:45 AM   HGBA1C 10.6 (A) 07/23/2020 08:09 AM   HGBA1C 10.6 (H) 01/22/2020 07:28 AM   HGBA1C 10.1 (H) 07/18/2019 11:33 AM   FRUCTOSAMINE 436 (H) 04/22/2020 08:35 AM   FRUCTOSAMINE 232 08/02/2017 08:26 AM   GFR 70.48 04/07/2020 08:39 AM   GFR 57.67 (L) 01/22/2020 07:28 AM   MICROALBUR <0.7 08/27/2014 09:51 AM    Last diabetic Eye exam:  Lab Results  Component Value Date/Time   HMDIABEYEEXA No Retinopathy 03/20/2020 12:00 AM    Last diabetic Foot exam: No results found for: HMDIABFOOTEX   Lab Results  Component Value Date   CHOL 94 01/22/2020   HDL 26.20 (L) 01/22/2020   LDLCALC 44 10/06/2017   LDLDIRECT 35.0 01/22/2020   TRIG (H) 01/22/2020    508.0 Triglyceride is over 400; calculations on Lipids are invalid.   CHOLHDL 4 01/22/2020    Hepatic Function Latest Ref Rng & Units 04/07/2020 01/22/2020 07/31/2019  Total Protein 6.0 - 8.3 g/dL - 7.0  5.8(L)  Albumin 3.5 - 5.2 g/dL 4.1 4.0 2.4(L)  AST 0 - 37 U/L - 24 14(L)  ALT 0 - 53 U/L - 43 22  Alk Phosphatase 39 - 117 U/L - 79 70  Total Bilirubin 0.2 - 1.2 mg/dL - 0.6 0.9  Bilirubin, Direct 0.0 - 0.2 mg/dL - - 0.2    Lab Results  Component Value Date/Time   TSH 1.10 04/07/2020 08:39 AM   TSH 0.716 07/18/2019 11:33 AM   TSH 1.47 10/03/2016 08:17 AM   FREET4 0.91 11/01/2013 03:06 PM   FREET4 0.9 05/27/2009 10:50 AM    CBC Latest Ref Rng & Units 10/05/2020 01/22/2020 08/02/2019  WBC 4.0 - 10.5 K/uL - 6.2 4.8  Hemoglobin 13.0 - 17.0 g/dL 13.3  12.8(L) 10.9(L)  Hematocrit 39.0 - 52.0 % 39.0 37.2(L) 32.3(L)  Platelets 150.0 - 400.0 K/uL - 133.0(L) 129(L)    Lab Results  Component Value Date/Time   VD25OH 38.35 01/22/2020 07:28 AM   VD25OH 41.68 10/15/2018 09:29 AM    Clinical ASCVD: {YES/NO:21197} The ASCVD Risk score Mikey Bussing DC Jr., et al., 2013) failed to calculate for the following reasons:   The valid total cholesterol range is 130 to 320 mg/dL    Depression screen [facility] 2/9 01/22/2020 10/15/2018 10/06/2017  Decreased Interest 0 0 0  Down, Depressed, Hopeless 0 0 0  PHQ - 2 Score 0 0 0  Altered sleeping 0 0 0  Tired, decreased energy 0 0 0  Change in appetite 0 0 0  Feeling bad or failure about yourself  0 0 0  Trouble concentrating 0 0 0  Moving slowly or fidgety/restless 0 0 0  Suicidal thoughts 0 0 0  PHQ-9 Score 0 0 0  Difficult doing work/chores Not difficult at all Not difficult at all Not difficult at all  Some recent data might be hidden    Social History   Tobacco Use  Smoking Status Never  Smokeless Tobacco Former   Types: Chew   BP Readings from Last 3 Encounters:  11/24/20 128/76  11/10/20 138/78  10/05/20 133/71   Pulse Readings from Last 3 Encounters:  11/24/20 81  11/10/20 83  10/05/20 89   Wt Readings from Last 3 Encounters:  01/12/21 260 lb (117.9 kg)  11/24/20 262 lb 9.6 oz (119.1 kg)  11/10/20 264 lb 1 oz (119.8 kg)   BMI Readings from Last 3 Encounters:  01/12/21 41.34 kg/m  11/24/20 41.75 kg/m  11/10/20 41.98 kg/m    Assessment/Interventions: Review of patient past medical history, allergies, medications, health status, including review of consultants reports, laboratory and other test data, was performed as part of comprehensive evaluation and provision of chronic care management services.   SDOH:  (Social Determinants of Health) assessments and interventions performed: {yes/no:20286}  SDOH Screenings   Alcohol Screen: Low Risk    Last Alcohol Screening Score (AUDIT): 0   Depression (PHQ2-9): Low Risk    PHQ-2 Score: 0  Financial Resource Strain: Low Risk    Difficulty of Paying Living Expenses: Not hard at all  Food Insecurity: No Food Insecurity   Worried About Charity fundraiser in the Last Year: Never true   Ran Out of Food in the Last Year: Never true  Housing: Low Risk    Last Housing Risk Score: 0  Physical Activity: Inactive   Days of Exercise per Week: 0 days   Minutes of Exercise per Session: 0 min  Social Connections: Not on file  Stress: No Stress  Concern Present   Feeling of Stress : Not at all  Tobacco Use: Medium Risk   Smoking Tobacco Use: Never   Smokeless Tobacco Use: Former  Transport planner Needs: No Data processing manager (Medical): No   Lack of Transportation (Non-Medical): No    CCM Care Plan  Allergies  Allergen Reactions   Tolmetin Rash    cirrhosis   Ambien [Zolpidem Tartrate] Other (See Comments)    Over-toxicity from liver failure   Morphine And Related     Hallucinations.   Hydrochlorothiazide W-Triamterene Other (See Comments)    REACTION: dizzy, nausea   Lisinopril Other (See Comments)    REACTION: cough, decreased libido    Medications Reviewed Today     Reviewed by Ria Bush, MD (Physician) on 01/12/21 at 1511  Med List Status: <None>   Medication Order Taking? Sig Documenting Provider Last Dose Status Informant  Accu-Chek FastClix Lancets MISC [id number] Yes USE THREE TIMES DAILY TO CHECK SUGARS E65.11, INSULIN USE Ria Bush, MD Taking Active   ACCU-CHEK GUIDE test strip [id number] Yes USE AS INSTRUCTED TO CHECK SUGARS THREE TIMES DAILY E11.65, INSULIN USE Ria Bush, MD Taking Active   acetaminophen (TYLENOL) 500 MG tablet [id number] Yes Take 500 mg by mouth every 6 (six) hours as needed for mild pain. [provider] Taking Active Spouse/Significant Other  allopurinol (ZYLOPRIM) 300 MG tablet [id number] Yes Take 1 tablet (300 mg total) by mouth daily.  Ria Bush, MD Taking Active   ASPIR-LOW 81 MG EC tablet [id number] Yes Take 1 tablet (81 mg total) by mouth daily. Ria Bush, MD Taking Active Spouse/Significant Other  B-D UF III MINI PEN NEEDLES 31G X 5 MM MISC [id number] Yes USE TO [place] Ria Bush, MD Taking Active   Blood Glucose Monitoring Suppl (ACCU-CHEK GUIDE) w/Device KIT [id number] Yes 1 Units by Does not apply route as directed. Ria Bush, MD Taking Active Spouse/Significant Other  Cholecalciferol (VITAMIN D3) 2000 units TABS [id number] Yes Take 1 tablet by mouth at bedtime. [provider] Taking Active Spouse/Significant Other  docusate sodium (COLACE) 100 MG capsule [id number] Yes Take 100 mg by mouth daily as needed for mild constipation.  [provider] Taking Active Spouse/Significant Other  empagliflozin (JARDIANCE) 10 MG TABS tablet [id number] No Take 1 tablet (10 mg total) by mouth daily before breakfast.  Patient not taking: Reported on 01/12/2021   Ria Bush, MD Not Taking Active   escitalopram (LEXAPRO) 10 MG tablet [id number] Yes TAKE 1 TABLET EVERY DAY Ria Bush, MD Taking Active   fluticasone [facility]) 50 MCG/ACT nasal spray [id number] Yes SPRAY 2 [place] NOSTRIL EVERY DAY Ria Bush, MD Taking Active   furosemide (LASIX) 40 MG tablet [id number] Yes Take 1 tablet (40 mg total) by mouth daily. Freada Bergeron, MD Taking Active   glucose blood (ACCU-CHEK GUIDE) test strip [id number] Yes Check sugars three times daily and as needed when feeling ill E11.65, insulin use Ria Bush, MD Taking Active Spouse/Significant Other  insulin glargine (LANTUS SOLOSTAR) 100 UNIT/ML Solostar Pen [id number] Yes Inject 100 Units into the skin every morning. And pen needles 1/day Renato Shin, MD Taking Active   loratadine (CLARITIN) 10 MG tablet [id number] Yes Take 10 mg by mouth daily. [provider] Taking Active Spouse/Significant Other   losartan (COZAAR) 50 MG tablet [id number] Yes Take 1 tablet (50 mg total) by mouth daily. Ria Bush, MD Taking Active   lovastatin (MEVACOR) 20 MG tablet [id number] Yes Take 1 tablet (  20 mg total) by mouth at bedtime. Ria Bush, MD Taking Active   Magnesium 500 MG TABS [id number] Yes Take 1 tablet (500 mg total) by mouth every Monday, Wednesday, and Friday. Ria Bush, MD Taking Active   metFORMIN (GLUCOPHAGE-XR) 500 MG 24 hr tablet [id number] Yes TAKE 1 TABLET BY MOUTH EVERY DAY WITH Sander Radon, MD Taking Active   Multiple Vitamins-Minerals ([facility] SPECTRAVITE PO) [id number] Yes Take 1 tablet by mouth at bedtime. [provider] Taking Active Spouse/Significant Other  mycophenolate (MYFORTIC) 360 MG TBEC EC tablet [id number] Yes Take 360 mg by mouth 2 (two) times daily. [provider] Taking Active   Omega-3 Fatty Acids (FISH OIL PO) [id number] Yes Take 1 tablet by mouth at bedtime. [provider] Taking Active Spouse/Significant Other  omeprazole (PRILOSEC) 40 MG capsule [id number] Yes TAKE 1 CAPSULE EVERY DAY Ria Bush, MD Taking Active   oxyCODONE (ROXICODONE) 5 MG immediate release tablet [id number] Yes 1-2 tabs PO q6 hours prn pain Leanora Cover, MD Taking Active   tacrolimus (PROGRAF) 1 MG capsule [id number] Yes Take 1-2 mg by mouth See admin instructions. Take 2 mg by mouth in the morning, then take 1 mg  by mouth in the evening [provider] Taking Active Spouse/Significant Other            Patient Active Problem List   Diagnosis Date Noted   Immunosuppressed status ([place]) 11/10/2020   Chronic right-sided thoracic back pain 04/07/2020   Memory loss 04/07/2020   Perianal pruritus 02/06/2020   Elevated PSA 02/06/2020   Advanced directives, counseling/discussion 02/06/2020   Acute pain of left foot 01/01/2020   Pain, dental 08/21/2019   Orthostatic hypotension 07/31/2019   Syncope 07/30/2019   Left rib  fracture 07/27/2019   Pneumonia due to COVID-19 virus 07/18/2019   [place] lesion, left 02/05/2018   Acute sinusitis 10/23/2017   Chronic heel pain, left 10/14/2017   Anejaculation 08/09/2017   Chronic gout 05/22/2017   Dyspnea 11/18/2016   BPH (benign prostatic hyperplasia) 10/05/2016   Status post liver transplant ([place]) 03/15/2016   Portal hypertensive gastropathy ([place]) 12/04/2015   Type 2 diabetes mellitus with other specified complication ([place]) 37/16/9678   Gynecomastia    CKD (chronic kidney disease) stage 3, GFR 30-59 ml/min (HCC) 11/04/2015   Depression, major, single episode, moderate ([place]) 07/09/2015   Headache 02/19/2015   Insomnia 12/06/2014   Hypomagnesemia    Other fatigue 11/18/2014   Internal hemorrhoid 10/01/2014   Erectile dysfunction 93/81/0175   Alcoholic cirrhosis of liver without ascites ([place]) 07/28/2014   (HFpEF) heart failure with preserved ejection fraction ([place]) 11/01/2013   Essential hypertension 06/04/2013   OSA on CPAP 07/11/2012   History of alcohol dependence ([place]) 07/11/2012   Thrombocytopenia ([place]) 12/15/2011   Morbid obesity with BMI of 40.0-44.9, adult ([place]) 11/16/2011   Routine general medical examination at a health care facility 09/22/2010   NEUROPATHY 05/27/2009   Pedal edema 09/22/2008   Dyslipidemia associated with type 2 diabetes mellitus ([place]) 02/15/2007   Allergic rhinitis 02/15/2007   GERD 02/15/2007    Immunization History  Administered Date(s) Administered   Hep A / Hep B 01/16/2015, 01/23/2015, 02/06/2015   Hepatitis B, adult 12/04/2015   Influenza Whole 03/24/2010   Influenza,inj,Quad PF,6+ Mos 04/22/2015, 04/06/2017, 04/23/2018, 04/23/2019, 04/07/2020   PFIZER(Purple Top)SARS-COV-2 Vaccination 11/07/2019, 11/28/2019, 05/07/2020, 12/04/2020   Pneumococcal Polysaccharide-23 04/22/2015   Td 05/01/2007   Tdap 12/14/2015, 10/05/2020    Conditions to be addressed/monitored:  {USCCMDZASSESSMENTOPTIONS:23563}  There are no  care plans that you recently modified to display for this patient.    Medication Assistance: {MEDASSISTANCEINFO:25044}  Compliance/Adherence/Medication fill history: Care Gaps: ***  Star-Rating Drugs: ***  Patient's preferred pharmacy is:  [id number] [place], [place] - [address] [place] [address] Phone: [phone] Fax: [phone]  [facility] - [place], [address] [place] [address] Phone: [phone] Fax: [phone]  [id number] [place], [place] [address] [place] [address] Phone: [phone] Fax: [phone]  Uses pill box? {Yes or If no, why not?:20788} Pt endorses ***% compliance  We discussed: {Pharmacy options:24294} Patient decided to: {US Pharmacy Plan:23885}  Care Plan and Follow Up Patient Decision:  {FOLLOWUP:24991}  Plan: {CM FOLLOW UP PLAN:25073}  ***

## 2021-01-14 NOTE — Telephone Encounter (Signed)
Noted. Recent treatment for acute sinusitis. I did ask him to do home COVID swab. Would ensure negative prior to bringing him in.   Yes would agree with Rx trulicity. Good idea to request patient asssicant for this as well. Would need to closely monitor for side effects as he didn't tolerate ozempic when previously tried 2020

## 2021-01-15 ENCOUNTER — Telehealth: Payer: Self-pay

## 2021-01-15 NOTE — Telephone Encounter (Signed)
Filled and in Lisa's box 

## 2021-01-15 NOTE — Telephone Encounter (Addendum)
Pt dropped of application for [employer] PAP for Trulicity and [employer] Cares PAP app for [employer].   Placed form in Dr. Synthia Innocent box.

## 2021-01-17 ENCOUNTER — Other Ambulatory Visit: Payer: Self-pay | Admitting: Family Medicine

## 2021-01-18 NOTE — Telephone Encounter (Addendum)
Faxed applications.  Decisions pending.

## 2021-01-20 ENCOUNTER — Other Ambulatory Visit: Payer: Self-pay | Admitting: *Deleted

## 2021-01-20 ENCOUNTER — Other Ambulatory Visit: Payer: Self-pay | Admitting: Family Medicine

## 2021-01-20 MED ORDER — FUROSEMIDE 40 MG PO TABS: 40.0000 mg | ORAL_TABLET | Freq: Every day | ORAL | 3 refills | Status: DC

## 2021-01-20 NOTE — Telephone Encounter (Signed)
Received faxed PAP approval for Trulicity from [facility], valid until end of 2022 calendar yr.

## 2021-01-21 NOTE — Telephone Encounter (Addendum)
Noted.  Form was sent to be scanned.  I will print, document info, then re-fax form as soon as it is scanned into chart.

## 2021-01-21 NOTE — Telephone Encounter (Signed)
Colton Porter wanted to let us know that the PAP (assuming [employer] Cares?) got his application and there was no date next to his signature (01/15/2021 was the date he signed). Could we add the date and re-fax for him?  Thanks,  Debbora Dus, PharmD Clinical Pharmacist [facility] at [facility] [phone]

## 2021-01-25 ENCOUNTER — Telehealth: Payer: Self-pay | Admitting: Family Medicine

## 2021-01-25 NOTE — Telephone Encounter (Signed)
LVM for pt to rtn my call to R/S appt with [facility]

## 2021-01-26 NOTE — Telephone Encounter (Addendum)
Pt's form has been updated and re-faxed.  FYI to [place].

## 2021-01-27 NOTE — Telephone Encounter (Signed)
No.  Looks like everything was signed.

## 2021-01-27 NOTE — Telephone Encounter (Signed)
Patient said he received a second letter stating his signature was missing. Did you notice this on the form? He is willing to come back in and sign it.  Debbora Dus, PharmD Clinical Pharmacist [facility] at [facility] [phone]

## 2021-01-29 NOTE — Telephone Encounter (Signed)
Spoke with Colton Porter of [employer] PAP to get clarification of missing signature.  Says he doesn't see anything else is needed on form.  Colton Porter processed it while on the call and states pt was approved as of today, 01/29/2021-06/16/2021.  Pt should received shipment in 5-7 business days.  Spoke with pt making him aware.  States he was receiving a text (or email) as we were speaking also informing him of the approval.  He expresses his thanks.  FYI to [place].

## 2021-01-30 ENCOUNTER — Other Ambulatory Visit: Payer: Self-pay | Admitting: Family Medicine

## 2021-02-06 ENCOUNTER — Other Ambulatory Visit: Payer: Self-pay | Admitting: Family Medicine

## 2021-02-06 DIAGNOSIS — R972 Elevated prostate specific antigen [PSA]: Secondary | ICD-10-CM

## 2021-02-06 DIAGNOSIS — E1169 Type 2 diabetes mellitus with other specified complication: Secondary | ICD-10-CM

## 2021-02-06 DIAGNOSIS — N1831 Chronic kidney disease, stage 3a: Secondary | ICD-10-CM

## 2021-02-06 DIAGNOSIS — M1A0790 Idiopathic chronic gout, unspecified ankle and foot, without tophus (tophi): Secondary | ICD-10-CM

## 2021-02-06 DIAGNOSIS — Z944 Liver transplant status: Secondary | ICD-10-CM

## 2021-02-08 ENCOUNTER — Other Ambulatory Visit: Payer: Self-pay

## 2021-02-08 ENCOUNTER — Other Ambulatory Visit (INDEPENDENT_AMBULATORY_CARE_PROVIDER_SITE_OTHER): Payer: Medicare HMO

## 2021-02-08 DIAGNOSIS — Z794 Long term (current) use of insulin: Secondary | ICD-10-CM | POA: Diagnosis not present

## 2021-02-08 DIAGNOSIS — M1A0790 Idiopathic chronic gout, unspecified ankle and foot, without tophus (tophi): Secondary | ICD-10-CM | POA: Diagnosis not present

## 2021-02-08 DIAGNOSIS — R972 Elevated prostate specific antigen [PSA]: Secondary | ICD-10-CM | POA: Diagnosis not present

## 2021-02-08 DIAGNOSIS — N1831 Chronic kidney disease, stage 3a: Secondary | ICD-10-CM | POA: Diagnosis not present

## 2021-02-08 DIAGNOSIS — E785 Hyperlipidemia, unspecified: Secondary | ICD-10-CM

## 2021-02-08 DIAGNOSIS — E1169 Type 2 diabetes mellitus with other specified complication: Secondary | ICD-10-CM

## 2021-02-08 DIAGNOSIS — Z944 Liver transplant status: Secondary | ICD-10-CM | POA: Diagnosis not present

## 2021-02-08 LAB — COMPREHENSIVE METABOLIC PANEL
ALT: 21 U/L (ref 0–53)
AST: 14 U/L (ref 0–37)
Albumin: 3.9 g/dL (ref 3.5–5.2)
Alkaline Phosphatase: 102 U/L (ref 39–117)
BUN: 13 mg/dL (ref 6–23)
CO2: 29 mEq/L (ref 19–32)
Calcium: 9.4 mg/dL (ref 8.4–10.5)
Chloride: 102 mEq/L (ref 96–112)
Creatinine, Ser: 1.2 mg/dL (ref 0.40–1.50)
GFR: 68.61 mL/min (ref >60.00)
Glucose, Bld: 266 mg/dL — ABNORMAL HIGH (ref 70–99)
Potassium: 4.3 mEq/L (ref 3.5–5.1)
Sodium: 140 mEq/L (ref 135–145)
Total Bilirubin: 0.5 mg/dL (ref 0.2–1.2)
Total Protein: 6.9 g/dL (ref 6.0–8.3)

## 2021-02-08 LAB — LIPID PANEL
Cholesterol: 98 mg/dL (ref 0–200)
HDL: 26.5 mg/dL — ABNORMAL LOW (ref >39.00)
Total CHOL/HDL Ratio: 4
Triglycerides: 409 mg/dL — ABNORMAL HIGH (ref 0.0–149.0)

## 2021-02-08 LAB — CBC WITH DIFFERENTIAL/PLATELET
Basophils Absolute: 0.1 10*3/uL (ref 0.0–0.1)
Basophils Relative: 0.8 % (ref 0.0–3.0)
Eosinophils Absolute: 0.2 10*3/uL (ref 0.0–0.7)
Eosinophils Relative: 2.3 % (ref 0.0–5.0)
HCT: 38.8 % — ABNORMAL LOW (ref 39.0–52.0)
Hemoglobin: 13 g/dL (ref 13.0–17.0)
Lymphocytes Relative: 26.6 % (ref 12.0–46.0)
Lymphs Abs: 1.8 10*3/uL (ref 0.7–4.0)
MCHC: 33.4 g/dL (ref 30.0–36.0)
MCV: 83.9 fl (ref 78.0–100.0)
Monocytes Absolute: 0.4 10*3/uL (ref 0.1–1.0)
Monocytes Relative: 6.5 % (ref 3.0–12.0)
Neutro Abs: 4.2 10*3/uL (ref 1.4–7.7)
Neutrophils Relative %: 63.8 % (ref 43.0–77.0)
Platelets: 143 10*3/uL — ABNORMAL LOW (ref 150.0–400.0)
RBC: 4.62 Mil/uL (ref 4.22–5.81)
RDW: 15.5 % (ref 11.5–15.5)
WBC: 6.6 10*3/uL (ref 4.0–10.5)

## 2021-02-08 LAB — URIC ACID: Uric Acid, Serum: 5 mg/dL (ref 4.0–7.8)

## 2021-02-08 LAB — HEMOGLOBIN A1C: Hgb A1c MFr Bld: 11.5 % — ABNORMAL HIGH (ref 4.6–6.5)

## 2021-02-08 LAB — PSA: PSA: 14.93 ng/mL — ABNORMAL HIGH (ref 0.10–4.00)

## 2021-02-08 LAB — LDL CHOLESTEROL, DIRECT: Direct LDL: 38 mg/dL

## 2021-02-08 LAB — MAGNESIUM: Magnesium: 1.7 mg/dL (ref 1.5–2.5)

## 2021-02-08 LAB — VITAMIN D 25 HYDROXY (VIT D DEFICIENCY, FRACTURES): VITD: 43.81 ng/mL (ref 30.00–100.00)

## 2021-02-10 ENCOUNTER — Ambulatory Visit: Payer: Medicare HMO

## 2021-02-10 DIAGNOSIS — Z48298 Encounter for aftercare following other organ transplant: Secondary | ICD-10-CM | POA: Diagnosis not present

## 2021-02-10 DIAGNOSIS — Z944 Liver transplant status: Secondary | ICD-10-CM | POA: Diagnosis not present

## 2021-02-10 DIAGNOSIS — Z79899 Other long term (current) drug therapy: Secondary | ICD-10-CM | POA: Diagnosis not present

## 2021-02-12 ENCOUNTER — Other Ambulatory Visit: Payer: Self-pay | Admitting: Family Medicine

## 2021-02-12 DIAGNOSIS — R972 Elevated prostate specific antigen [PSA]: Secondary | ICD-10-CM

## 2021-02-15 ENCOUNTER — Encounter: Payer: Self-pay | Admitting: Family Medicine

## 2021-02-15 ENCOUNTER — Other Ambulatory Visit: Payer: Self-pay

## 2021-02-15 ENCOUNTER — Ambulatory Visit (INDEPENDENT_AMBULATORY_CARE_PROVIDER_SITE_OTHER): Payer: Medicare HMO | Admitting: Family Medicine

## 2021-02-15 VITALS — BP 126/84 | HR 88 | Temp 98.1°F | Ht 66.5 in | Wt 256.0 lb

## 2021-02-15 DIAGNOSIS — E785 Hyperlipidemia, unspecified: Secondary | ICD-10-CM

## 2021-02-15 DIAGNOSIS — Z0000 Encounter for general adult medical examination without abnormal findings: Secondary | ICD-10-CM | POA: Insufficient documentation

## 2021-02-15 DIAGNOSIS — Z23 Encounter for immunization: Secondary | ICD-10-CM

## 2021-02-15 DIAGNOSIS — F321 Major depressive disorder, single episode, moderate: Secondary | ICD-10-CM

## 2021-02-15 DIAGNOSIS — K3189 Other diseases of stomach and duodenum: Secondary | ICD-10-CM

## 2021-02-15 DIAGNOSIS — D696 Thrombocytopenia, unspecified: Secondary | ICD-10-CM

## 2021-02-15 DIAGNOSIS — E1169 Type 2 diabetes mellitus with other specified complication: Secondary | ICD-10-CM

## 2021-02-15 DIAGNOSIS — R972 Elevated prostate specific antigen [PSA]: Secondary | ICD-10-CM

## 2021-02-15 DIAGNOSIS — Z794 Long term (current) use of insulin: Secondary | ICD-10-CM

## 2021-02-15 DIAGNOSIS — I10 Essential (primary) hypertension: Secondary | ICD-10-CM

## 2021-02-15 DIAGNOSIS — Z944 Liver transplant status: Secondary | ICD-10-CM

## 2021-02-15 DIAGNOSIS — M1A0790 Idiopathic chronic gout, unspecified ankle and foot, without tophus (tophi): Secondary | ICD-10-CM

## 2021-02-15 DIAGNOSIS — D849 Immunodeficiency, unspecified: Secondary | ICD-10-CM

## 2021-02-15 DIAGNOSIS — K766 Portal hypertension: Secondary | ICD-10-CM

## 2021-02-15 DIAGNOSIS — I5032 Chronic diastolic (congestive) heart failure: Secondary | ICD-10-CM

## 2021-02-15 DIAGNOSIS — N1831 Chronic kidney disease, stage 3a: Secondary | ICD-10-CM

## 2021-02-15 DIAGNOSIS — Z7189 Other specified counseling: Secondary | ICD-10-CM

## 2021-02-15 DIAGNOSIS — Z6841 Body Mass Index (BMI) 40.0 and over, adult: Secondary | ICD-10-CM

## 2021-02-15 NOTE — Assessment & Plan Note (Addendum)
Chronic, deteriorated. He has recently restarted trulicity and jardiance - receiving patient assistance so he can take. Tolerating well. Also followed by endo. Continues lantus 100u daily and metformin XR '500mg'$  daily.

## 2021-02-15 NOTE — Progress Notes (Signed)
Patient ID: Colton Porter, male    DOB: 1965/07/02, 55 y.o.   MRN: [id number]  This visit was conducted in person.  BP 126/84   Pulse 88   Temp 98.1 F (36.7 C) (Temporal)   Ht 5' 6.5" (1.689 m)   Wt 256 lb (116.1 kg)   SpO2 95%   BMI 40.70 kg/m    CC: AMW/CPE Subjective:   HPI: Colton Porter is a 55 y.o. male presenting on 02/15/2021 for Annual Exam   Did not see health advisor this year.   Hearing Screening   500Hz  1000Hz  2000Hz  4000Hz   Right ear 40 40 40 40  Left ear 40 40 40 40   Vision Screening   Right eye Left eye Both eyes  Without correction 20/25 20/25 20/20   With correction       Flowsheet Row Office Visit from 02/15/2021 in [facility]  PHQ-2 Total Score 0       Fall Risk  02/15/2021 01/22/2020 10/15/2018 10/06/2017 09/11/2017  Falls in the past year? 0 1 0 No No  Comment - passed out - - -  Number falls in past yr: 0 0 - - -  Injury with Fall? 0 1 - - -  Comment - broke rib - - -  Risk for fall due to : - Medication side effect;Impaired balance/gait - - -  Follow up - Falls evaluation completed;Falls prevention discussed - - -   Progressive PSA elevation - 3.99 (2020), 8.22 (2021), 14.9 (2022). Nocturia x2, lower back pain, no dysuria, urgency or incomplete emptying. Missed urology appt from last year. Will re-refer.   Just restarted jardiance, on trulicity x3 weeks now.   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 Symptoms of nocturia x1-2 present. No stream weaknening noted.  Lung cancer screening - not eligible  Flu shot yearly Gill[place] 10/2019, 11/2019, booster x2 04/2020, 11/2020 Pneumovax 2016. [id number] today Td 2008, Tdap 2017  shingrix - discussed  Hep A/B series completed  Advanced directives: does not have at home. Wife Colton Porter then daughter Colton Porter  would be HCPOA. Advanced directive packet previously provided, again today.  Seat belt use  Sunscreen use. No changing moles on skin. Sees derm.  Non smoker. Quit chewing tobacco 10/10/2015.  Alcohol - abstinent since 10/10/2014.  Dentist - yearly - currently undergoing dental extractions.  Eye exam - due  Bowel - no constipation, occasional stool incontinence/leaking, worse with straining  Bladder - no incontinence   Lives with wife Occupation: full disability after cirrhosis dx, some working  Activity: some yardwork Diet: good water, good fruit intake     Relevant past medical, surgical, family and social history reviewed and updated as indicated. Interim medical history since our last visit reviewed. Allergies and medications reviewed and updated. Outpatient Medications Prior to Visit  Medication Sig Dispense Refill  . Accu-Chek FastClix Lancets MISC USE THREE TIMES DAILY TO CHECK SUGARS E65.11, INSULIN USE 102 each 12  . ACCU-CHEK GUIDE test strip USE AS INSTRUCTED TO CHECK SUGARS THREE TIMES DAILY E11.65, INSULIN USE 100 strip 12  . acetaminophen (TYLENOL) 500 MG tablet Take 500 mg by mouth every 6 (six) hours as needed for mild pain.    Marland Kitchen allopurinol (ZYLOPRIM) 300 MG tablet Take 1 tablet (300 mg total) by mouth daily. 90 tablet 0  . ASPIR-LOW 81 MG EC tablet Take 1 tablet (  81 mg total) by mouth daily. 90 tablet 3  . B-D UF III MINI PEN NEEDLES 31G X 5 MM MISC USE TO INJECT INSULIN DAILY 100 each 3  . Blood Glucose Monitoring Suppl (ACCU-CHEK GUIDE) w/Device KIT 1 Units by Does not apply route as directed. 1 kit 0  . Cholecalciferol (VITAMIN D3) 2000 units TABS Take 1 tablet by mouth at bedtime.    . docusate sodium (COLACE) 100 MG capsule Take 100 mg by mouth daily as needed for mild constipation.     Marland Kitchen doxycycline (VIBRA-TABS) 100 MG tablet Take 1 tablet (100 mg total) by mouth 2 (two) times daily. 20 tablet 0  . Dulaglutide (TRULICITY) 8.33 PO/2.5PG SOPN Inject into the skin.  Inject 0.75 mg once weekly    . empagliflozin (JARDIANCE) 10 MG TABS tablet Take 1 tablet (10 mg total) by mouth daily before breakfast. 90 tablet 1  . escitalopram (LEXAPRO) 10 MG tablet TAKE 1 TABLET EVERY DAY 90 tablet 0  . fluticasone (FLONASE) 50 MCG/ACT nasal spray SPRAY 2 SPRAYS INTO EACH NOSTRIL EVERY DAY 48 mL 0  . furosemide (LASIX) 40 MG tablet Take 1 tablet (40 mg total) by mouth daily. 90 tablet 3  . glucose blood (ACCU-CHEK GUIDE) test strip Check sugars three times daily and as needed when feeling ill E11.65, insulin use 100 each 11  . insulin glargine (LANTUS SOLOSTAR) 100 UNIT/ML Solostar Pen Inject 100 Units into the skin every morning. And pen needles 1/day 105 mL 3  . loratadine (CLARITIN) 10 MG tablet Take 10 mg by mouth daily.    Marland Kitchen losartan (COZAAR) 50 MG tablet Take 1 tablet (50 mg total) by mouth daily. 30 tablet 1  . lovastatin (MEVACOR) 20 MG tablet Take 1 tablet (20 mg total) by mouth at bedtime. 90 tablet 3  . Magnesium 500 MG TABS Take 1 tablet (500 mg total) by mouth every Monday, Wednesday, and Friday. 30 tablet   . metFORMIN (GLUCOPHAGE-XR) 500 MG 24 hr tablet TAKE 1 TABLET BY MOUTH EVERY DAY WITH BREAKFAST 90 tablet 0  . Multiple Vitamins-Minerals ([employer] SPECTRAVITE PO) Take 1 tablet by mouth at bedtime.    . mycophenolate (MYFORTIC) 360 MG TBEC EC tablet Take 360 mg by mouth 2 (two) times daily.    . Omega-3 Fatty Acids (FISH OIL PO) Take 1 tablet by mouth at bedtime.    Marland Kitchen omeprazole (PRILOSEC) 40 MG capsule TAKE 1 CAPSULE EVERY DAY 90 capsule 3  . oxyCODONE (ROXICODONE) 5 MG immediate release tablet 1-2 tabs PO q6 hours prn pain 20 tablet 0  . tacrolimus (PROGRAF) 1 MG capsule Take 1-2 mg by mouth See admin instructions. Take 2 mg by mouth in the morning, then take 1 mg  by mouth in the evening     No facility-administered medications prior to visit.     Per HPI unless specifically indicated in ROS section below Review of Systems  Constitutional:  Negative for  activity change, appetite change, chills, fatigue, fever and unexpected weight change.  HENT:  Negative for hearing loss.   Eyes:  Negative for visual disturbance.  Respiratory:  Negative for cough, chest tightness, shortness of breath and wheezing.   Cardiovascular:  Positive for leg swelling (managed with compression stockings and jardiance). Negative for chest pain and palpitations.  Gastrointestinal:  Negative for abdominal distention, abdominal pain, blood in stool, constipation, diarrhea, nausea and vomiting.  Genitourinary:  Negative for difficulty urinating and hematuria.  Musculoskeletal:  Negative for arthralgias, myalgias and neck  pain.  Skin:  Negative for rash.  Neurological:  Positive for headaches. Negative for dizziness, seizures and syncope.  Hematological:  Negative for adenopathy. Does not bruise/bleed easily.  Psychiatric/Behavioral:  Negative for dysphoric mood. The patient is not nervous/anxious.    Objective:  BP 126/84   Pulse 88   Temp 98.1 F (36.7 C) (Temporal)   Ht 5' 6.5" (1.689 m)   Wt 256 lb (116.1 kg)   SpO2 95%   BMI 40.70 kg/m   Wt Readings from Last 3 Encounters:  02/15/21 256 lb (116.1 kg)  01/12/21 260 lb (117.9 kg)  11/24/20 262 lb 9.6 oz (119.1 kg)      Physical Exam Vitals and nursing note reviewed.  Constitutional:      General: He is not in acute distress.    Appearance: Normal appearance. He is well-developed. He is obese. He is not ill-appearing.  HENT:     Head: Normocephalic and atraumatic.     Right Ear: Hearing, tympanic membrane, ear canal and external ear normal.     Left Ear: Hearing, tympanic membrane, ear canal and external ear normal.  Eyes:     General: No scleral icterus.    Extraocular Movements: Extraocular movements intact.     Conjunctiva/sclera: Conjunctivae normal.     Pupils: Pupils are equal, round, and reactive to light.  Neck:     Thyroid: No thyroid mass or thyromegaly.     Vascular: No carotid bruit.   Cardiovascular:     Rate and Rhythm: Normal rate and regular rhythm.     Pulses: Normal pulses.          Radial pulses are 2+ on the right side and 2+ on the left side.     Heart sounds: Normal heart sounds. No murmur heard. Pulmonary:     Effort: Pulmonary effort is normal. No respiratory distress.     Breath sounds: Normal breath sounds. No wheezing, rhonchi or rales.  Abdominal:     General: Bowel sounds are normal. There is no distension.     Palpations: Abdomen is soft. There is no mass.     Tenderness: There is no abdominal tenderness. There is no guarding or rebound.     Hernia: No hernia is present.  Musculoskeletal:        General: Normal range of motion.     Cervical back: Normal range of motion and neck supple.     Right lower leg: No edema.     Left lower leg: No edema.  Lymphadenopathy:     Cervical: No cervical adenopathy.  Skin:    General: Skin is warm and dry.     Findings: No rash.  Neurological:     General: No focal deficit present.     Mental Status: He is alert and oriented to person, place, and time.     Comments:  Recall 3/3 Calculation 5/5 DLROW  Psychiatric:        Mood and Affect: Mood normal.        Behavior: Behavior normal.        Thought Content: Thought content normal.        Judgment: Judgment normal.      Results for orders placed or performed in visit on 02/08/21  Magnesium  Result Value Ref Range   Magnesium 1.7 1.5 - 2.5 mg/dL  Uric acid  Result Value Ref Range   Uric Acid, Serum 5.0 4.0 - 7.8 mg/dL  CBC with Differential/Platelet  Result Value Ref  Range   WBC 6.6 4.0 - 10.5 K/uL   RBC 4.62 4.22 - 5.81 Mil/uL   Hemoglobin 13.0 13.0 - 17.0 g/dL   HCT 38.8 (L) 39.0 - 52.0 %   MCV 83.9 78.0 - 100.0 fl   MCHC 33.4 30.0 - 36.0 g/dL   RDW 15.5 11.5 - 15.5 %   Platelets 143.0 (L) 150.0 - 400.0 K/uL   Neutrophils Relative % 63.8 43.0 - 77.0 %   Lymphocytes Relative 26.6 12.0 - 46.0 %   Monocytes Relative 6.5 3.0 - 12.0 %    Eosinophils Relative 2.3 0.0 - 5.0 %   Basophils Relative 0.8 0.0 - 3.0 %   Neutro Abs 4.2 1.4 - 7.7 K/uL   Lymphs Abs 1.8 0.7 - 4.0 K/uL   Monocytes Absolute 0.4 0.1 - 1.0 K/uL   Eosinophils Absolute 0.2 0.0 - 0.7 K/uL   Basophils Absolute 0.1 0.0 - 0.1 K/uL  PSA  Result Value Ref Range   PSA 14.93 (H) 0.10 - 4.00 ng/mL  Hemoglobin A1c  Result Value Ref Range   Hgb A1c MFr Bld 11.5 (H) 4.6 - 6.5 %  Comprehensive metabolic panel  Result Value Ref Range   Sodium 140 135 - 145 mEq/L   Potassium 4.3 3.5 - 5.1 mEq/L   Chloride 102 96 - 112 mEq/L   CO2 29 19 - 32 mEq/L   Glucose, Bld 266 (H) 70 - 99 mg/dL   BUN 13 6 - 23 mg/dL   Creatinine, Ser 1.20 0.40 - 1.50 mg/dL   Total Bilirubin 0.5 0.2 - 1.2 mg/dL   Alkaline Phosphatase 102 39 - 117 U/L   AST 14 0 - 37 U/L   ALT 21 0 - 53 U/L   Total Protein 6.9 6.0 - 8.3 g/dL   Albumin 3.9 3.5 - 5.2 g/dL   GFR 68.61 >60.00 mL/min   Calcium 9.4 8.4 - 10.5 mg/dL  Lipid panel  Result Value Ref Range   Cholesterol 98 0 - 200 mg/dL   Triglycerides (H) 0.0 - 149.0 mg/dL    409.0 Triglyceride is over 400; calculations on Lipids are invalid.   HDL 26.50 (L) >39.00 mg/dL   Total CHOL/HDL Ratio 4   VITAMIN D 25 Hydroxy (Vit-D Deficiency, Fractures)  Result Value Ref Range   VITD 43.81 30.00 - 100.00 ng/mL  LDL cholesterol, direct  Result Value Ref Range   Direct LDL 38.0 mg/dL    Assessment & Plan:  This visit occurred during the SARS-CoV-2 public health emergency.  Safety protocols were in place, including screening questions prior to the visit, additional usage of staff PPE, and extensive cleaning of exam room while observing appropriate contact time as indicated for disinfecting solutions.   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 Trig markedly elevated anticipate due to hyperglycemia. No changes made today.  The ASCVD Risk  score Mikey Bussing DC Jr., et al., 2013) failed to calculate for the following reasons:   The valid total cholesterol range is 130 to 320 mg/dL       Routine general medical examination at a health care facility (Chronic)    Preventative protocols reviewed and updated unless pt declined. Discussed healthy diet and lifestyle.       Essential hypertension (Chronic)    Chronic, stable. Continue current regimen.       Type 2 diabetes mellitus with other specified complication (  HCC) (Chronic)    Chronic, deteriorated. He has recently restarted trulicity and jardiance - receiving patient assistance so he can take. Tolerating well. Also followed by endo. Continues lantus 100u daily and metformin XR 500mg  daily.       Status post liver transplant ([place]) (Chronic)    Continue mycophenolate and prograf.       Advanced directives, counseling/discussion (Chronic)    Advanced directives: does not have at home. Wife Colton Porter then daughter Colton Porter would be HCPOA. Advanced directive packet previously provided, again today.       Medicare annual wellness visit, subsequent - Primary (Chronic)    I have personally reviewed the Medicare Annual Wellness questionnaire and have noted 1. The patient's medical and social history 2. Their use of alcohol, tobacco or illicit drugs 3. Their current medications and supplements 4. The patient's functional ability including ADL's, fall risks, home safety risks and hearing or visual impairment. Cognitive function has been assessed and addressed as indicated.  5. Diet and physical activity 6. Evidence for depression or mood disorders The patients weight, height, BMI have been recorded in the chart. I have made referrals, counseling and provided education to the patient based on review of the above and I have provided the pt with a written personalized care plan for preventive services. Provider list updated.. See scanned questionairre as needed for further  documentation. Reviewed preventative protocols and updated unless pt declined.       Morbid obesity with BMI of 40.0-44.9, adult ([place])    Ongoing weight loss noted. Continue to monitor.       Thrombocytopenia (HCC)    Chronic, mild, stable.      (HFpEF) heart failure with preserved ejection fraction [facility])    Appreciate cards care.  Recently started jardiance.       Depression, major, single episode, moderate (HCC)    Chronic, stable on lexapro 10mg  daily      CKD (chronic kidney disease) stage 3, GFR 30-59 ml/min (HCC)    Kidney function actually may have improved since jardiance started continue to monitor.       Portal hypertensive gastropathy (HCC)   Chronic gout    No recent gout flares. Continue allopurinol 300mg  daily .      Elevated PSA    Continued concerning elevation. Discussed need to r/o prostate cancer. New urgent referral placed for urology evaluation. He will keep appointment when scheduled.       Relevant Orders   Ambulatory referral to Urology   Immunosuppressed status [facility])   Other Visit Diagnoses     Need for vaccination against Streptococcus pneumoniae       Relevant Orders   Pneumococcal conjugate vaccine 20-valent (Prevnar 20) (Completed)        No orders of the defined types were placed in this encounter.  Orders Placed This Encounter  Procedures  . Pneumococcal conjugate vaccine 20-valent (Prevnar 20)  . Ambulatory referral to Urology    Referral Priority:   Urgent    Referral Type:   Consultation    Referral Reason:   Specialty Services Required    Requested Specialty:   Urology    Number of Visits Requested:   1    Patient instructions: Prostate levels continue going up - we will refer you back to urology. Call [facility] at [phone] to schedule appointment.  [id number] today.  If interested, check with pharmacy about new 2 shot shingles series (shingrix).  Check on eye exam.  Advanced directive packet  provided today.   Return as needed or in 4 months for follow up visit.   Follow up plan: Return in about 4 months (around 06/17/2021) for follow up visit.  Ria Bush, MD

## 2021-02-15 NOTE — Assessment & Plan Note (Signed)
Continue mycophenolate and prograf.

## 2021-02-15 NOTE — Assessment & Plan Note (Signed)
Preventative protocols reviewed and updated unless pt declined. Discussed healthy diet and lifestyle.  

## 2021-02-15 NOTE — Assessment & Plan Note (Signed)
Kidney function actually may have improved since jardiance started continue to monitor.

## 2021-02-15 NOTE — Assessment & Plan Note (Signed)
Chronic, mild, stable.  

## 2021-02-15 NOTE — Assessment & Plan Note (Signed)
Appreciate cards care.  Recently started jardiance.

## 2021-02-15 NOTE — Assessment & Plan Note (Signed)
Chronic, stable on lexapro '10mg'$  daily

## 2021-02-15 NOTE — Assessment & Plan Note (Signed)
No recent gout flares. Continue allopurinol '300mg'$  daily .

## 2021-02-15 NOTE — Assessment & Plan Note (Signed)
Chronic, stable. Continue current regimen. 

## 2021-02-15 NOTE — Patient Instructions (Addendum)
Prostate levels continue going up - we will refer you back to urology. Call [facility] at [phone] to schedule appointment.  [id number] today.  If interested, check with pharmacy about new 2 shot shingles series (shingrix).  Check on eye exam.  Advanced directive packet provided today.  Return as needed or in 4 months for follow up visit.   Health Maintenance, Male Adopting a healthy lifestyle and getting preventive care are important in promoting health and wellness. Ask your health care provider about: The right schedule for you to have regular tests and exams. Things you can do on your own to prevent diseases and keep yourself healthy. What should I know about diet, weight, and exercise? Eat a healthy diet  Eat a diet that includes plenty of vegetables, fruits, low-fat dairy products, and lean protein. Do not eat a lot of foods that are high in solid fats, added sugars, or sodium.  Maintain a healthy weight Body mass index (BMI) is a measurement that can be used to identify possible weight problems. It estimates body fat based on height and weight. Your health care provider can help determine your BMI and help you achieve or maintain ahealthy weight. Get regular exercise Get regular exercise. This is one of the most important things you can do for your health. Most adults should: Exercise for at least 150 minutes each week. The exercise should increase your heart rate and make you sweat (moderate-intensity exercise). Do strengthening exercises at least twice a week. This is in addition to the moderate-intensity exercise. Spend less time sitting. Even light physical activity can be beneficial. Watch cholesterol and blood lipids Have your blood tested for lipids and cholesterol at 54 years of age, then havethis test every 5 years. You may need to have your cholesterol levels checked more often if: Your lipid or cholesterol levels are high. You are older than 54 years of age. You  are at high risk for heart disease. What should I know about cancer screening? Many types of cancers can be detected early and may often be prevented. Depending on your health history and family history, you may need to have cancer screening at various ages. This may include screening for: Colorectal cancer. Prostate cancer. Skin cancer. Lung cancer. What should I know about heart disease, diabetes, and high blood pressure? Blood pressure and heart disease High blood pressure causes heart disease and increases the risk of stroke. This is more likely to develop in people who have high blood pressure readings, are of African descent, or are overweight. Talk with your health care provider about your target blood pressure readings. Have your blood pressure checked: Every 3-5 years if you are 17-63 years of age. Every year if you are 58 years old or older. If you are between the ages of 45 and 77 and are a current or former smoker, ask your health care provider if you should have a one-time screening for abdominal aortic aneurysm (AAA). Diabetes Have regular diabetes screenings. This checks your fasting blood sugar level. Have the screening done: Once every three years after age 60 if you are at a normal weight and have a low risk for diabetes. More often and at a younger age if you are overweight or have a high risk for diabetes. What should I know about preventing infection? Hepatitis B If you have a higher risk for hepatitis B, you should be screened for this virus. Talk with your health care provider to find out if you are at  risk forhepatitis B infection. Hepatitis C Blood testing is recommended for: Everyone born from [birth year] through 1964. Anyone with known risk factors for hepatitis C. Sexually transmitted infections (STIs) You should be screened each year for STIs, including gonorrhea and chlamydia, if: You are sexually active and are younger than 54 years of age. You are older than 54  years of age and your health care provider tells you that you are at risk for this type of infection. Your sexual activity has changed since you were last screened, and you are at increased risk for chlamydia or gonorrhea. Ask your health care provider if you are at risk. Ask your health care provider about whether you are at high risk for HIV. Your health care provider may recommend a prescription medicine to help prevent HIV infection. If you choose to take medicine to prevent HIV, you should first get tested for HIV. You should then be tested every 3 months for as long as you are taking the medicine. Follow these instructions at home: Lifestyle Do not use any products that contain nicotine or tobacco, such as cigarettes, e-cigarettes, and chewing tobacco. If you need help quitting, ask your health care provider. Do not use street drugs. Do not share needles. Ask your health care provider for help if you need support or information about quitting drugs. Alcohol use Do not drink alcohol if your health care provider tells you not to drink. If you drink alcohol: Limit how much you have to 0-2 drinks a day. Be aware of how much alcohol is in your drink. In the [place] one drink equals one 12 oz bottle of beer (355 mL), one 5 oz glass of wine (148 mL), or one 1 oz glass of hard liquor (44 mL). General instructions Schedule regular health, dental, and eye exams. Stay current with your vaccines. Tell your health care provider if: You often feel depressed. You have ever been abused or do not feel safe at home. Summary Adopting a healthy lifestyle and getting preventive care are important in promoting health and wellness. Follow your health care provider's instructions about healthy diet, exercising, and getting tested or screened for diseases. Follow your health care provider's instructions on monitoring your cholesterol and blood pressure. This information is not intended to replace advice given to  you by your health care provider. Make sure you discuss any questions you have with your healthcare provider. Document Revised: 06/06/2018 Document Reviewed: 06/06/2018 Elsevier Patient Education  2022 [employer].

## 2021-02-15 NOTE — Assessment & Plan Note (Signed)
Advanced directives: does not have at home. Wife Faythe Dingwall then daughter Campbell Stall would be HCPOA. Advanced directive packet previously provided, again today.

## 2021-02-15 NOTE — Assessment & Plan Note (Signed)
Continued concerning elevation. Discussed need to r/o prostate cancer. New urgent referral placed for urology evaluation. He will keep appointment when scheduled.

## 2021-02-15 NOTE — Assessment & Plan Note (Signed)

## 2021-02-15 NOTE — Assessment & Plan Note (Signed)
Chronic, LDL at goal on pravastatin. Trig markedly elevated anticipate due to hyperglycemia. No changes made today.  The ASCVD Risk score Mikey Bussing DC Jr., et al., 2013) failed to calculate for the following reasons:   The valid total cholesterol range is 130 to 320 mg/dL

## 2021-02-15 NOTE — Assessment & Plan Note (Signed)
Ongoing weight loss noted. Continue to monitor.

## 2021-02-18 ENCOUNTER — Telehealth: Payer: Self-pay

## 2021-02-18 NOTE — Chronic Care Management (AMB) (Addendum)
Chronic Care Management Pharmacy Assistant   Name: Colton Porter  MRN: [id number] DOB: 1965/08/19  Reason for Encounter: Diabetes Review  Recent office visits:  02/15/21 - PCP - Patient presented for AWV. Labs ordered (new A1c 11.5). Referral to urology. Follow up 4 months. 01/12/21 - PCP - Video visit for headache, ear pain, acute sinusitis. Start short course of doxycycline $RemoveBefore'100mg'eGBfobDlfxNVJ$  take 1 tablet 2 times daily. Recommend home covid test.  Recent consult visits:  None since last CCM visit   Hospital visits:  None in previous 6 months  Medications: Outpatient Encounter Medications as of 02/18/2021  Medication Sig   Accu-Chek FastClix Lancets MISC USE THREE TIMES DAILY TO CHECK SUGARS E65.11, INSULIN USE   ACCU-CHEK GUIDE test strip USE AS INSTRUCTED TO CHECK SUGARS THREE TIMES DAILY E11.65, INSULIN USE   acetaminophen (TYLENOL) 500 MG tablet Take 500 mg by mouth every 6 (six) hours as needed for mild pain.   allopurinol (ZYLOPRIM) 300 MG tablet Take 1 tablet (300 mg total) by mouth daily.   ASPIR-LOW 81 MG EC tablet Take 1 tablet (81 mg total) by mouth daily.   B-D UF III MINI PEN NEEDLES 31G X 5 MM MISC USE TO INJECT INSULIN DAILY   Blood Glucose Monitoring Suppl (ACCU-CHEK GUIDE) w/Device KIT 1 Units by Does not apply route as directed.   Cholecalciferol (VITAMIN D3) 2000 units TABS Take 1 tablet by mouth at bedtime.   docusate sodium (COLACE) 100 MG capsule Take 100 mg by mouth daily as needed for mild constipation.    doxycycline (VIBRA-TABS) 100 MG tablet Take 1 tablet (100 mg total) by mouth 2 (two) times daily.   Dulaglutide (TRULICITY) 8.14 GY/1.8HU SOPN Inject into the skin. Inject 0.75 mg once weekly   empagliflozin (JARDIANCE) 10 MG TABS tablet Take 1 tablet (10 mg total) by mouth daily before breakfast.   escitalopram (LEXAPRO) 10 MG tablet TAKE 1 TABLET EVERY DAY   fluticasone (FLONASE) 50 MCG/ACT nasal spray SPRAY 2 SPRAYS INTO EACH NOSTRIL EVERY DAY   furosemide  (LASIX) 40 MG tablet Take 1 tablet (40 mg total) by mouth daily.   glucose blood (ACCU-CHEK GUIDE) test strip Check sugars three times daily and as needed when feeling ill E11.65, insulin use   insulin glargine (LANTUS SOLOSTAR) 100 UNIT/ML Solostar Pen Inject 100 Units into the skin every morning. And pen needles 1/day   loratadine (CLARITIN) 10 MG tablet Take 10 mg by mouth daily.   losartan (COZAAR) 50 MG tablet Take 1 tablet (50 mg total) by mouth daily.   lovastatin (MEVACOR) 20 MG tablet Take 1 tablet (20 mg total) by mouth at bedtime.   Magnesium 500 MG TABS Take 1 tablet (500 mg total) by mouth every Monday, Wednesday, and Friday.   metFORMIN (GLUCOPHAGE-XR) 500 MG 24 hr tablet TAKE 1 TABLET BY MOUTH EVERY DAY WITH BREAKFAST   Multiple Vitamins-Minerals (CVS SPECTRAVITE PO) Take 1 tablet by mouth at bedtime.   mycophenolate (MYFORTIC) 360 MG TBEC EC tablet Take 360 mg by mouth 2 (two) times daily.   Omega-3 Fatty Acids (FISH OIL PO) Take 1 tablet by mouth at bedtime.   omeprazole (PRILOSEC) 40 MG capsule TAKE 1 CAPSULE EVERY DAY   oxyCODONE (ROXICODONE) 5 MG immediate release tablet 1-2 tabs PO q6 hours prn pain   tacrolimus (PROGRAF) 1 MG capsule Take 1-2 mg by mouth See admin instructions. Take 2 mg by mouth in the morning, then take 1 mg  by mouth in the  evening   No facility-administered encounter medications on file as of 02/18/2021.    Recent Relevant Labs: Lab Results  Component Value Date/Time   HGBA1C 11.5 (H) 02/08/2021 08:02 AM   HGBA1C 9.2 (A) 09/28/2020 08:45 AM   HGBA1C 10.6 (A) 07/23/2020 08:09 AM   HGBA1C 10.6 (H) 01/22/2020 07:28 AM   MICROALBUR <0.7 08/27/2014 09:51 AM    Kidney Function Lab Results  Component Value Date/Time   CREATININE 1.20 02/08/2021 08:02 AM   CREATININE 1.30 (H) 10/05/2020 07:56 PM   CREATININE 2.06 (H) 06/14/2016 08:56 AM   CREATININE 1.51 (H) 06/08/2016 12:06 PM   CREATININE 0.9 08/02/2012 08:39 AM   GFR 68.61 02/08/2021 08:02 AM    GFRNONAA >60 08/02/2019 01:09 AM   GFRAA >60 08/02/2019 01:09 AM     Contacted patient on 02/18/2021 to discuss diabetes disease state.   Current antihyperglycemic regimen:  Trulicity 0.75mg /0.58ml  Inject once weekly(sundays) Jardiance 10mg  take 1 tablet daily  Lantus 100unit/ml - Injects 102 units daily  Metforfmin XR 500 mg - take 1 tablet at breakfast    Patient verbally confirms he is taking the above medications as directed. Yes  What diet changes have been made to improve diabetes control? The patient will drink lots of water and eat fresh fruits.  What recent interventions/DTPs have been made to improve glycemic control: 02/15/21-PCP-Recent restart of Jardiance and Trulicity through patient assistance   Have there been any recent hospitalizations or ED visits since last visit with CPP? No  Patient denies hypoglycemic symptoms, including Pale, Sweaty, Shaky, Hungry, Nervous/irritable, and Vision changes  Patient reports hyperglycemic symptoms, including Blurred vision on occasion however the PCP checked this out last visit and found no issues.  How often are you checking your blood sugar? The patient has not been checking home blood glucose  What are your blood sugars ranging? The patient reports BG's usually around 250. The patient states he will start checking BG's again   During the week, how often does your blood glucose drop below 70? Never  Are you checking your feet daily/regularly? Yes wife looks at feet  Adherence Review: Is the patient currently on a STATIN medication? Yes Is the patient currently on ACE/ARB medication? Yes Does the patient have >5 day gap between last estimated fill dates? No   Star Rating Drugs:  Medication:  Last Fill: Day Supply Metformin XR 500mg   01/19/21 90 Lantus [id number]  0/09/38 90 Trulicity  07/05/27  90 (PAP)  Jardiance 10mg  12/03/20  90 (PAP) Losartan 50mg  12/10/20 90 Lovastatin 20mg  12/14/20 90  The patient reports he got a  shipment(PAP) Trulicity 3 weeks ago for 90 DS. The patient reports he got a shipment (PAP) Jardiance for 90 DS.  Upcoming appts: PCP appointment on 06/18/21 and Endocrinology appointment on 03/22/21  Debbora Dus, CPP notified  Avel Sensor, [place] Assistant [phone]  I have reviewed the care management and care coordination activities outlined in this encounter and I am certifying that I agree with the content of this note. Schedule CCM visit for early Sept.  Debbora Dus, PharmD Clinical Pharmacist [facility] at [facility] [phone]

## 2021-02-23 DIAGNOSIS — L738 Other specified follicular disorders: Secondary | ICD-10-CM | POA: Diagnosis not present

## 2021-02-23 DIAGNOSIS — D1801 Hemangioma of skin and subcutaneous tissue: Secondary | ICD-10-CM | POA: Diagnosis not present

## 2021-02-23 DIAGNOSIS — L820 Inflamed seborrheic keratosis: Secondary | ICD-10-CM | POA: Diagnosis not present

## 2021-02-23 DIAGNOSIS — L821 Other seborrheic keratosis: Secondary | ICD-10-CM | POA: Diagnosis not present

## 2021-02-23 DIAGNOSIS — D225 Melanocytic nevi of trunk: Secondary | ICD-10-CM | POA: Diagnosis not present

## 2021-02-23 DIAGNOSIS — L814 Other melanin hyperpigmentation: Secondary | ICD-10-CM | POA: Diagnosis not present

## 2021-02-23 NOTE — Progress Notes (Signed)
02/24/21 10:29 AM   Colton Porter Apr 17, 1966 [id number]  Referring provider:  Ria Bush, MD [address] [place],  [place] [address] Chief Complaint  Patient presents with   Elevated PSA     HPI: Colton Porter is a 55 y.o.male who presents today for further evaluation of elevated PSA.   His PSA has been monitored by his PCP, Dr. Ria Bush. His most recent PSA on 02/08/2021 was 14.93. Referral was placed to rule out prostate cancer. PSA trend as below.  He is doing well today and is accompanied by his wife. He is experiencing left hip. He states the hip pain has been present for about a year and it has worsened the last few months. He has been drinking more liquids which he feels equates for why he is having urinary frequency.   He states he does not have a family history of prostate cancer.   PSA trend:  Component     Latest Ref Rng & Units 10/03/2016 10/06/2017 10/15/2018 01/22/2020  PSA     0.10 - 4.00 ng/mL 2.07 2.66 3.99 8.22 (H)   Component     Latest Ref Rng & Units 02/08/2021  PSA     0.10 - 4.00 ng/mL 14.93 (H)     PMH: Past Medical History:  Diagnosis Date   Alcohol dependence ([place]) 07/11/7260   Alcoholic cirrhosis of liver with ascites ([place]) 07/2014   s./p transplant 12/2015   Allergy    Anemia    Cellulitis of left leg    Chronic diastolic heart failure ([place]) 11/01/2013   CKD (chronic kidney disease) stage 3, GFR 30-59 ml/min ([place]) 11/04/2015   Diabetes mellitus without complication (HCC)    Type II   GERD (gastroesophageal reflux disease)    Hyperlipidemia    Hypertension    Neuropathy    OSA on CPAP 07/11/2012   HST 07/2013:  AHI 39/hr.     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  Irrigation debridement of right thumb open distal phalanx fracture 2.  Repair of right thumb skin and nailbed laceration ;  Surgeon: Leanora Cover, MD;  Location: [facility];  Service: Orthopedics;  Laterality: Right;   LEFT AND RIGHT HEART CATHETERIZATION WITH CORONARY ANGIOGRAM N/A 06/10/2013   Procedure: LEFT AND RIGHT HEART CATHETERIZATION WITH CORONARY ANGIOGRAM;  Surgeon: Blane Ohara, MD;  Location: [facility] C[facility] LAB;  Service: Cardiovascular;  Laterality: N/A;   LIVER TRANSPLANTATION  08/5595   alcoholic cirrhosis ([facility])    Home Medications:  Allergies as of 02/24/2021       Reactions   Tolmetin Rash   cirrhosis   Ambien [zolpidem Tartrate] Other (See Comments)   Over-toxicity from liver failure   Morphine And Related    Hallucinations.   Hydrochlorothiazide W-triamterene Other (See Comments)   REACTION: dizzy, nausea   Lisinopril Other (See Comments)   REACTION: cough, decreased libido        Medication List        Accurate as of February 24, 2021 10:29 AM. If you have any questions, ask your nurse  or doctor.          Accu-Chek FastClix Lancets Misc USE THREE TIMES DAILY TO CHECK SUGARS E65.11, INSULIN USE   Accu-Chek Guide w/Device Kit 1 Units by Does not apply route as directed.   acetaminophen 500 MG tablet Commonly known as: TYLENOL Take 500 mg by mouth every 6 (six) hours as needed for mild pain.   allopurinol 300 MG tablet Commonly known as: ZYLOPRIM Take 1 tablet (300 mg total) by mouth daily.   Aspir-Low 81 MG EC tablet Generic drug: aspirin Take 1 tablet (81 mg total) by mouth daily.   B-D UF III MINI PEN NEEDLES 31G X 5 MM Misc Generic drug: Insulin Pen Needle USE TO INJECT INSULIN DAILY   CVS SPECTRAVITE PO Take 1 tablet by mouth at bedtime.   docusate sodium 100 MG capsule Commonly known as: COLACE Take 100 mg  by mouth daily as needed for mild constipation.   doxycycline 100 MG tablet Commonly known as: VIBRA-TABS Take 1 tablet (100 mg total) by mouth 2 (two) times daily.   empagliflozin 10 MG Tabs tablet Commonly known as: Jardiance Take 1 tablet (10 mg total) by mouth daily before breakfast.   escitalopram 10 MG tablet Commonly known as: LEXAPRO TAKE 1 TABLET EVERY DAY   FISH OIL PO Take 1 tablet by mouth at bedtime.   fluticasone 50 MCG/ACT nasal spray Commonly known as: FLONASE SPRAY 2 SPRAYS INTO EACH NOSTRIL EVERY DAY   furosemide 40 MG tablet Commonly known as: LASIX Take 1 tablet (40 mg total) by mouth daily.   glucose blood test strip Commonly known as: Accu-Chek Guide Check sugars three times daily and as needed when feeling ill E11.65, insulin use   Accu-Chek Guide test strip Generic drug: glucose blood USE AS INSTRUCTED TO CHECK SUGARS THREE TIMES DAILY E11.65, INSULIN USE   Lantus SoloStar 100 UNIT/ML Solostar Pen Generic drug: insulin glargine Inject 100 Units into the skin every morning. And pen needles 1/day   loratadine 10 MG tablet Commonly known as: CLARITIN Take 10 mg by mouth daily.   losartan 50 MG tablet Commonly known as: COZAAR Take 1 tablet (50 mg total) by mouth daily.   lovastatin 20 MG tablet Commonly known as: MEVACOR Take 1 tablet (20 mg total) by mouth at bedtime.   Magnesium 500 MG Tabs Take 1 tablet (500 mg total) by mouth every Monday, Wednesday, and Friday.   metFORMIN 500 MG 24 hr tablet Commonly known as: GLUCOPHAGE-XR TAKE 1 TABLET BY MOUTH EVERY DAY WITH BREAKFAST   mycophenolate 360 MG Tbec EC tablet Commonly known as: MYFORTIC Take 360 mg by mouth 2 (two) times daily.   omeprazole 40 MG capsule Commonly known as: PRILOSEC TAKE 1 CAPSULE EVERY DAY   oxyCODONE 5 MG immediate release tablet Commonly known as: Roxicodone 1-2 tabs PO q6 hours prn pain   tacrolimus 1 MG capsule Commonly known as: PROGRAF Take 1-2 mg  by mouth See admin instructions. Take 2 mg by mouth in the morning, then take 1 mg  by mouth in the evening   Trulicity 8.41 LK/4.4WN Sopn Generic drug: Dulaglutide Inject into the skin. Inject 0.75 mg once weekly   Vitamin D3 50 MCG (2000 UT) Tabs Take 1 tablet by mouth at bedtime.        Allergies:  Allergies  Allergen Reactions   Tolmetin Rash    cirrhosis   Ambien [Zolpidem Tartrate] Other (See Comments)    Over-toxicity from liver failure   Morphine  And Related     Hallucinations.   Hydrochlorothiazide W-Triamterene Other (See Comments)    REACTION: dizzy, nausea   Lisinopril Other (See Comments)    REACTION: cough, decreased libido    Family History: Family History  Problem Relation Age of Onset   Stroke Mother    Emphysema Mother    Hypertension Father    Heart disease Father    Emphysema Father    Lung cancer Maternal Aunt    Stroke Paternal Grandmother    Heart attack Neg Hx    Diabetes Neg Hx     Social History:  reports that he has never smoked. He has quit using smokeless tobacco.  His smokeless tobacco use included chew. He reports that he does not currently use drugs. He reports that he does not drink alcohol.   Physical Exam: BP 114/78   Pulse 98   Ht 5' 6.5" (1.689 m)   Wt 257 lb (116.6 kg)   BMI 40.86 kg/m   Constitutional:  Alert and oriented, No acute distress. HEENT: [place] AT, moist mucus membranes.  Trachea midline, no masses. Cardiovascular: No clubbing, cyanosis, or edema. Respiratory: Normal respiratory effort, no increased work of breathing. Rectal: Normal sphincter tone,  30  CC prostate, rubbery nodularity of right side, firm asymmetry at right apex .  Skin: No rashes, bruises or suspicious lesions. Neurologic: Grossly intact, no focal deficits, moving all 4 extremities. Psychiatric: Normal mood and affect.  Laboratory Data:  Lab Results  Component Value Date   CREATININE 1.20 02/08/2021    Lab Results  Component Value Date    PSA 14.93 (H) 02/08/2021   PSA 8.22 (H) 01/22/2020   PSA 3.99 10/15/2018     Lab Results  Component Value Date   HGBA1C 11.5 (H) 02/08/2021      Assessment & Plan:    Elevated PSA  We reviewed the implications of an elevated PSA and the uncertainty surrounding it. In general, a man's PSA increases with age and is produced by both normal and cancerous prostate tissue. Differential for elevated PSA is BPH, prostate cancer, infection, recent intercourse/ejaculation, prostate infarction, recent urethroscopic manipulation (foley placement/cystoscopy) and prostatitis. Management of an elevated PSA can include observation or prostate biopsy and wediscussed this in detail. We discussed that indications for prostate biopsy are defined by age and race specific PSA cutoffs as well as a PSA velocity of 0.75/year.  Rising with abnormal rectal exam, needs biopsy  We discussed prostate biopsy in detail including the procedure itself, the risks of blood in the urine, stool, and ejaculate, serious infection, and discomfort. He is willing to proceed with this as discussed.   History of thrombocytopenia  -most recent plt greater than 100, risk of bleeding minimal  - Will need to hold aspirin  -Clearance from transplant team requested, increased risk of infection, discussed with patient   Schedule prostate biopsy   I,Kailey Littlejohn,acting as a scribe for Hollice Espy, MD.,have documented all relevant documentation on the behalf of Hollice Espy, MD,as directed by  Hollice Espy, MD while in the presence of Hollice Espy, MD.  I have reviewed the above documentation for accuracy and completeness, and I agree with the above.   Hollice Espy, MD   [facility] [address], [place],  [address] [phone]

## 2021-02-24 ENCOUNTER — Other Ambulatory Visit: Payer: Self-pay

## 2021-02-24 ENCOUNTER — Ambulatory Visit: Payer: Medicare HMO | Admitting: Urology

## 2021-02-24 ENCOUNTER — Encounter: Payer: Self-pay | Admitting: Urology

## 2021-02-24 VITALS — BP 114/78 | HR 98 | Ht 66.5 in | Wt 257.0 lb

## 2021-02-24 DIAGNOSIS — R972 Elevated prostate specific antigen [PSA]: Secondary | ICD-10-CM | POA: Diagnosis not present

## 2021-02-24 NOTE — Patient Instructions (Signed)

## 2021-02-25 LAB — PSA: Prostate Specific Ag, Serum: 16.8 ng/mL — ABNORMAL HIGH (ref 0.0–4.0)

## 2021-02-26 ENCOUNTER — Telehealth: Payer: Self-pay

## 2021-02-26 LAB — URINALYSIS, COMPLETE
Bilirubin, UA: NEGATIVE
Ketones, UA: NEGATIVE
Leukocytes,UA: NEGATIVE
Nitrite, UA: NEGATIVE
Protein,UA: NEGATIVE
RBC, UA: NEGATIVE
Specific Gravity, UA: 1.015 (ref 1.005–1.030)
Urobilinogen, Ur: 0.2 mg/dL (ref 0.2–1.0)
pH, UA: 5 (ref 5.0–7.5)

## 2021-02-26 LAB — MICROSCOPIC EXAMINATION
Bacteria, UA: NONE SEEN
Epithelial Cells (non renal): NONE SEEN /hpf (ref 0–10)
RBC, Urine: NONE SEEN /hpf (ref 0–2)

## 2021-02-26 NOTE — Chronic Care Management (AMB) (Addendum)
Chronic Care Management Pharmacy Assistant   Name: Colton Porter  MRN: [id number] DOB: [birth year]   Reason for Encounter: CCM Reminder Call   Conditions to be addressed/monitored: HTN, DMII, and CKD Stage 3   Medications: Outpatient Encounter Medications as of 02/26/2021  Medication Sig   Accu-Chek FastClix Lancets MISC USE THREE TIMES DAILY TO CHECK SUGARS E65.11, INSULIN USE   ACCU-CHEK GUIDE test strip USE AS INSTRUCTED TO CHECK SUGARS THREE TIMES DAILY E11.65, INSULIN USE   acetaminophen (TYLENOL) 500 MG tablet Take 500 mg by mouth every 6 (six) hours as needed for mild pain.   allopurinol (ZYLOPRIM) 300 MG tablet Take 1 tablet (300 mg total) by mouth daily.   ASPIR-LOW 81 MG EC tablet Take 1 tablet (81 mg total) by mouth daily.   B-D UF III MINI PEN NEEDLES 31G X 5 MM MISC USE TO INJECT INSULIN DAILY   Blood Glucose Monitoring Suppl (ACCU-CHEK GUIDE) w/Device KIT 1 Units by Does not apply route as directed.   Cholecalciferol (VITAMIN D3) 2000 units TABS Take 1 tablet by mouth at bedtime.   docusate sodium (COLACE) 100 MG capsule Take 100 mg by mouth daily as needed for mild constipation.    doxycycline (VIBRA-TABS) 100 MG tablet Take 1 tablet (100 mg total) by mouth 2 (two) times daily.   Dulaglutide (TRULICITY) 0.75 MG/0.5ML SOPN Inject into the skin. Inject 0.75 mg once weekly   empagliflozin (JARDIANCE) 10 MG TABS tablet Take 1 tablet (10 mg total) by mouth daily before breakfast.   escitalopram (LEXAPRO) 10 MG tablet TAKE 1 TABLET EVERY DAY   fluticasone (FLONASE) 50 MCG/ACT nasal spray SPRAY 2 SPRAYS INTO EACH NOSTRIL EVERY DAY   furosemide (LASIX) 40 MG tablet Take 1 tablet (40 mg total) by mouth daily.   glucose blood (ACCU-CHEK GUIDE) test strip Check sugars three times daily and as needed when feeling ill E11.65, insulin use   insulin glargine (LANTUS SOLOSTAR) 100 UNIT/ML Solostar Pen Inject 100 Units into the skin every morning. And pen needles 1/day   loratadine  (CLARITIN) 10 MG tablet Take 10 mg by mouth daily.   losartan (COZAAR) 50 MG tablet Take 1 tablet (50 mg total) by mouth daily.   lovastatin (MEVACOR) 20 MG tablet Take 1 tablet (20 mg total) by mouth at bedtime.   Magnesium 500 MG TABS Take 1 tablet (500 mg total) by mouth every Monday, Wednesday, and Friday.   metFORMIN (GLUCOPHAGE-XR) 500 MG 24 hr tablet TAKE 1 TABLET BY MOUTH EVERY DAY WITH BREAKFAST   Multiple Vitamins-Minerals (CVS SPECTRAVITE PO) Take 1 tablet by mouth at bedtime.   mycophenolate (MYFORTIC) 360 MG TBEC EC tablet Take 360 mg by mouth 2 (two) times daily.   Omega-3 Fatty Acids (FISH OIL PO) Take 1 tablet by mouth at bedtime.   omeprazole (PRILOSEC) 40 MG capsule TAKE 1 CAPSULE EVERY DAY   oxyCODONE (ROXICODONE) 5 MG immediate release tablet 1-2 tabs PO q6 hours prn pain   tacrolimus (PROGRAF) 1 MG capsule Take 1-2 mg by mouth See admin instructions. Take 2 mg by mouth in the morning, then take 1 mg  by mouth in the evening   No facility-administered encounter medications on file as of 02/26/2021.   Colton Porter was contacted to remind him of his upcoming telephone visit with Phil Dopp on 03/02/21 at 1:45pm. Patient was reminded to have all medications, supplements and any blood glucose and blood pressure readings available for review at appointment.   Are  you having any problems with your medications? No  Do you have any concerns you like to discuss with the pharmacist? No    Star Rating Drugs: Medication:   Last Fill: Day Supply Trulicity   0/9/23  90  (PAP) Metformin XR $RemoveBefo'500mg'tJYqoteRDgz$  10/18/20 90 Jardiance $RemoveBefo'10mg'rrDnkVVycLS$   12/03/20  90 (PAP) Lantus    11/09/20 90 Losartan $RemoveBef'50mg'pUQeMufVyc$   12/10/20 90 Lovastatin $RemoveBefor'20mg'tjnjBPopUzyA$   12/14/20 [place], CPP notified  Avel Sensor, [place] Assistant [phone]  I have reviewed the care management and care coordination activities outlined in this encounter and I am certifying that I agree with the content of this note. No  further action required.  Debbora Dus, PharmD Clinical Pharmacist [place] [facility] at [facility] [phone]

## 2021-03-02 ENCOUNTER — Other Ambulatory Visit: Payer: Self-pay

## 2021-03-02 ENCOUNTER — Telehealth: Payer: Self-pay | Admitting: Family Medicine

## 2021-03-02 ENCOUNTER — Ambulatory Visit (INDEPENDENT_AMBULATORY_CARE_PROVIDER_SITE_OTHER): Payer: Medicare HMO

## 2021-03-02 ENCOUNTER — Telehealth: Payer: Self-pay | Admitting: *Deleted

## 2021-03-02 DIAGNOSIS — E1169 Type 2 diabetes mellitus with other specified complication: Secondary | ICD-10-CM

## 2021-03-02 DIAGNOSIS — Z794 Long term (current) use of insulin: Secondary | ICD-10-CM

## 2021-03-02 NOTE — Telephone Encounter (Addendum)
Patient aware, voiced understanding. Reviewed understanding  ----- Message from Hollice Espy, MD sent at 02/25/2021  6:23 PM EDT ----- PSA still marketly elevated.  Plan for biopsy as planned.  Hollice Espy, MD

## 2021-03-02 NOTE — Progress Notes (Signed)
Chronic Care Management Pharmacy Note  03/02/2021 Name:  Colton Porter MRN:  [id number] DOB:  03-20-1966  Summary: Discussed diabetes. Fasting remains elevated, BG 195 and 200 last 2 days, although he did have cake last night. He started Trulicity 0.16 mg 4 weeks ago, tolerating well. Also resumed Jardiance around this same time through [facility] cares. His Lantus is still unaffordable. Recommend PAP. Would increase Trulicity but he has 60 day supply left of 0.75 mg dose. Will let him finish this out. He has endo follow up in 3 weeks.   Recommendations/Changes made from today's visit: Apply for Lantus assistance program  Plan: Follow up 30 days    Subjective: Colton Porter is an 55 y.o. year old male who is a primary patient of Ria Bush, MD.  The CCM team was consulted for assistance with disease management and care coordination needs.    Engaged with patient by telephone for follow up visit in response to provider referral for pharmacy case management and/or care coordination services.   Consent to Services:  The patient was given information about Chronic Care Management services, agreed to services, and gave verbal consent prior to initiation of services.  Please see initial visit note for detailed documentation.   Patient Care Team: Ria Bush, MD as PCP - General (Family Medicine) Debbora Dus, [facility] as Pharmacist (Pharmacist)  Recent office visits: 02/15/21 - PCP - Patient presented for AWV. Labs ordered (new A1c 11.5). Referral to urology. Follow up 4 months. 01/12/21 - PCP - Video visit for headache, ear pain, acute sinusitis. Start short course of doxycycline $RemoveBefore'100mg'sbXIZNjPoVgOa$  take 1 tablet 2 times daily. Recommend home covid test.   Recent consult visits:  None since last CCM visit    Hospital visits:  None in previous 6 months   Objective:  Lab Results  Component Value Date   CREATININE 1.20 02/08/2021   BUN 13 02/08/2021   GFR 68.61 02/08/2021   GFRNONAA  >60 08/02/2019   GFRAA >60 08/02/2019   NA 140 02/08/2021   K 4.3 02/08/2021   CALCIUM 9.4 02/08/2021   CO2 29 02/08/2021   GLUCOSE 266 (H) 02/08/2021    Lab Results  Component Value Date/Time   HGBA1C 11.5 (H) 02/08/2021 08:02 AM   HGBA1C 9.2 (A) 09/28/2020 08:45 AM   HGBA1C 10.6 (A) 07/23/2020 08:09 AM   HGBA1C 10.6 (H) 01/22/2020 07:28 AM   FRUCTOSAMINE 436 (H) 04/22/2020 08:35 AM   FRUCTOSAMINE 232 08/02/2017 08:26 AM   GFR 68.61 02/08/2021 08:02 AM   GFR 70.48 04/07/2020 08:39 AM   MICROALBUR <0.7 08/27/2014 09:51 AM    Last diabetic Eye exam:  Lab Results  Component Value Date/Time   HMDIABEYEEXA No Retinopathy 03/20/2020 12:00 AM     Lab Results  Component Value Date   CHOL 98 02/08/2021   HDL 26.50 (L) 02/08/2021   LDLCALC 44 10/06/2017   LDLDIRECT 38.0 02/08/2021   TRIG (H) 02/08/2021    409.0 Triglyceride is over 400; calculations on Lipids are invalid.   CHOLHDL 4 02/08/2021    Hepatic Function Latest Ref Rng & Units 02/08/2021 04/07/2020 01/22/2020  Total Protein 6.0 - 8.3 g/dL 6.9 - 7.0  Albumin 3.5 - 5.2 g/dL 3.9 4.1 4.0  AST 0 - 37 U/L 14 - 24  ALT 0 - 53 U/L 21 - 43  Alk Phosphatase 39 - 117 U/L 102 - 79  Total Bilirubin 0.2 - 1.2 mg/dL 0.5 - 0.6  Bilirubin, Direct 0.0 - 0.2 mg/dL - - -  Lab Results  Component Value Date/Time   TSH 1.10 04/07/2020 08:39 AM   TSH 0.716 07/18/2019 11:33 AM   TSH 1.47 10/03/2016 08:17 AM   FREET4 0.91 11/01/2013 03:06 PM   FREET4 0.9 05/27/2009 10:50 AM    CBC Latest Ref Rng & Units 02/08/2021 10/05/2020 01/22/2020  WBC 4.0 - 10.5 K/uL 6.6 - 6.2  Hemoglobin 13.0 - 17.0 g/dL 13.0 13.3 12.8(L)  Hematocrit 39.0 - 52.0 % 38.8(L) 39.0 37.2(L)  Platelets 150.0 - 400.0 K/uL 143.0(L) - 133.0(L)    Lab Results  Component Value Date/Time   VD25OH 43.81 02/08/2021 08:02 AM   VD25OH 38.35 01/22/2020 07:28 AM    Clinical ASCVD: No  The ASCVD Risk score Mikey Bussing DC Jr., et al., 2013) failed to calculate for the  following reasons:   The valid total cholesterol range is 130 to 320 mg/dL    Depression screen [facility] 2/9 02/15/2021 01/22/2020 10/15/2018  Decreased Interest 0 0 0  Down, Depressed, Hopeless 0 0 0  PHQ - 2 Score 0 0 0  Altered sleeping 2 0 0  Tired, decreased energy 2 0 0  Change in appetite 1 0 0  Feeling bad or failure about yourself  0 0 0  Trouble concentrating 0 0 0  Moving slowly or fidgety/restless 0 0 0  Suicidal thoughts 0 0 0  PHQ-9 Score 5 0 0  Difficult doing work/chores - Not difficult at all Not difficult at all  Some recent data might be hidden    Social History   Tobacco Use  Smoking Status Never  Smokeless Tobacco Former   Types: Chew   BP Readings from Last 3 Encounters:  02/24/21 114/78  02/15/21 126/84  11/24/20 128/76   Pulse Readings from Last 3 Encounters:  02/24/21 98  02/15/21 88  11/24/20 81   Wt Readings from Last 3 Encounters:  02/24/21 257 lb (116.6 kg)  02/15/21 256 lb (116.1 kg)  01/12/21 260 lb (117.9 kg)   BMI Readings from Last 3 Encounters:  02/24/21 40.86 kg/m  02/15/21 40.70 kg/m  01/12/21 41.34 kg/m    Assessment/Interventions: Review of patient past medical history, allergies, medications, health status, including review of consultants reports, laboratory and other test data, was performed as part of comprehensive evaluation and provision of chronic care management services.   SDOH:  (Social Determinants of Health) assessments and interventions performed: Yes SDOH Interventions    Flowsheet Row Most Recent Value  SDOH Interventions   Financial Strain Interventions Other (Comment)  [Lantus assistance program]      SDOH Screenings   Alcohol Screen: Not on file  Depression (PHQ2-9): Medium Risk   PHQ-2 Score: 5  Financial Resource Strain: Medium Risk   Difficulty of Paying Living Expenses: Somewhat hard  Food Insecurity: Not on file  Housing: Not on file  Physical Activity: Not on file  Social Connections: Not on  file  Stress: Not on file  Tobacco Use: Medium Risk   Smoking Tobacco Use: Never   Smokeless Tobacco Use: Former  Transport planner Needs: Not on file    CCM Care Plan  Allergies  Allergen Reactions   Tolmetin Rash    cirrhosis   Ambien [Zolpidem Tartrate] Other (See Comments)    Over-toxicity from liver failure   Morphine And Related     Hallucinations.   Hydrochlorothiazide W-Triamterene Other (See Comments)    REACTION: dizzy, nausea   Lisinopril Other (See Comments)    REACTION: cough, decreased libido    Medications Reviewed Today  Reviewed by Alvera Novel, [place] (Certified Medical Assistant) on 02/24/21 at [phone]  Med List Status: <None>   Medication Order Taking? Sig Documenting Provider Last Dose Status Informant  Accu-Chek FastClix Lancets MISC [id number] Yes USE THREE TIMES DAILY TO CHECK SUGARS E65.11, INSULIN USE Ria Bush, MD Taking Active   ACCU-CHEK GUIDE test strip [id number] Yes USE AS INSTRUCTED TO CHECK SUGARS THREE TIMES DAILY E11.65, INSULIN USE Ria Bush, MD Taking Active   acetaminophen (TYLENOL) 500 MG tablet [id number] Yes Take 500 mg by mouth every 6 (six) hours as needed for mild pain. [provider] Taking Active Spouse/Significant Other  allopurinol (ZYLOPRIM) 300 MG tablet [id number] Yes Take 1 tablet (300 mg total) by mouth daily. Ria Bush, MD Taking Active   ASPIR-LOW 81 MG EC tablet [id number] Yes Take 1 tablet (81 mg total) by mouth daily. Ria Bush, MD Taking Active Spouse/Significant Other  B-D UF III MINI PEN NEEDLES 31G X 5 MM MISC [id number] Yes USE TO [place] Ria Bush, MD Taking Active   Blood Glucose Monitoring Suppl (ACCU-CHEK GUIDE) w/Device KIT [id number] Yes 1 Units by Does not apply route as directed. Ria Bush, MD Taking Active Spouse/Significant Other  Cholecalciferol (VITAMIN D3) 2000 units TABS [id number] Yes Take 1 tablet by mouth at bedtime. [provider]  Taking Active Spouse/Significant Other  docusate sodium (COLACE) 100 MG capsule [id number] Yes Take 100 mg by mouth daily as needed for mild constipation.  [provider] Taking Active Spouse/Significant Other  doxycycline (VIBRA-TABS) 100 MG tablet [id number] Yes Take 1 tablet (100 mg total) by mouth 2 (two) times daily. Ria Bush, MD Taking Active   Dulaglutide (TRULICITY) 9.03 ES/9.2ZR Bon[place] Aid [id number] Yes Inject into the skin. Inject 0.75 mg once weekly [provider] Taking Active   empagliflozin (JARDIANCE) 10 MG TABS tablet [id number] Yes Take 1 tablet (10 mg total) by mouth daily before breakfast. Ria Bush, MD Taking Active   escitalopram (LEXAPRO) 10 MG tablet [id number] Yes TAKE 1 TABLET EVERY DAY Ria Bush, MD Taking Active   fluticasone [facility]) 50 MCG/ACT nasal spray [id number] Yes SPRAY 2 SPRAYS INTO EACH NOSTRIL EVERY DAY Ria Bush, MD Taking Active   furosemide (LASIX) 40 MG tablet [id number] Yes Take 1 tablet (40 mg total) by mouth daily. Freada Bergeron, MD Taking Active   glucose blood (ACCU-CHEK GUIDE) test strip [id number] Yes Check sugars three times daily and as needed when feeling ill E11.65, insulin use Ria Bush, MD Taking Active Spouse/Significant Other  insulin glargine (LANTUS SOLOSTAR) 100 UNIT/ML Solostar Pen [id number] Yes Inject 100 Units into the skin every morning. And pen needles 1/day Renato Shin, MD Taking Active   loratadine (CLARITIN) 10 MG tablet [id number] Yes Take 10 mg by mouth daily. [provider] Taking Active Spouse/Significant Other  losartan (COZAAR) 50 MG tablet [id number] Yes Take 1 tablet (50 mg total) by mouth daily. Ria Bush, MD Taking Active   lovastatin (MEVACOR) 20 MG tablet [id number] Yes Take 1 tablet (20 mg total) by mouth at bedtime. Ria Bush, MD Taking Active   Magnesium 500 MG TABS [id number] Yes Take 1 tablet (500 mg total) by mouth every Monday, Wednesday,  and Friday. Ria Bush, MD Taking Active   metFORMIN (GLUCOPHAGE-XR) 500 MG 24 hr tablet [id number] Yes TAKE 1 TABLET BY MOUTH EVERY DAY WITH Sander Radon, MD Taking Active   Multiple Vitamins-Minerals ([facility] SPECTRAVITE PO) [id number] Yes Take 1 tablet by mouth at bedtime. [provider] Taking Active Spouse/Significant Other  mycophenolate (MYFORTIC) 360 MG TBEC EC tablet [id number] Yes Take 360 mg by mouth 2 (two) times daily. [provider] Taking Active   Omega-3 Fatty Acids (FISH OIL PO) [id number] Yes Take 1 tablet by mouth at bedtime. [provider] Taking Active Spouse/Significant Other  omeprazole (PRILOSEC) 40 MG capsule [id number] Yes TAKE 1 CAPSULE EVERY DAY Ria Bush, MD Taking Active   oxyCODONE (ROXICODONE) 5 MG immediate release tablet [id number] Yes 1-2 tabs PO q6 hours prn pain Leanora Cover, MD Taking Active   tacrolimus (PROGRAF) 1 MG capsule [id number] Yes Take 1-2 mg by mouth See admin instructions. Take 2 mg by mouth in the morning, then take 1 mg  by mouth in the evening [provider] Taking Active Spouse/Significant Other            Patient Active Problem List   Diagnosis Date Noted   Medicare annual wellness visit, subsequent 02/15/2021   Immunosuppressed status ([place]) 11/10/2020   Chronic right-sided thoracic back pain 04/07/2020   Memory loss 04/07/2020   Perianal pruritus 02/06/2020   Elevated PSA 02/06/2020   Advanced directives, counseling/discussion 02/06/2020   Pain, dental 08/21/2019   Orthostatic hypotension 07/31/2019   Syncope 07/30/2019   Left rib fracture 07/27/2019   Pneumonia due to COVID-19 virus 07/18/2019   [place] lesion, left 02/05/2018   Acute sinusitis 10/23/2017   Chronic heel pain, left 10/14/2017   Anejaculation 08/09/2017   Chronic gout 05/22/2017   Dyspnea 11/18/2016   BPH (benign prostatic hyperplasia) 10/05/2016   Status post liver transplant ([place]) 03/15/2016    Portal hypertensive gastropathy ([place]) 12/04/2015   Type 2 diabetes mellitus with other specified complication ([place]) 22/07/5425   Gynecomastia    CKD (chronic kidney disease) stage 3, GFR 30-59 ml/min (HCC) 11/04/2015   Depression, major, single episode, moderate ([place]) 07/09/2015   Headache 02/19/2015   Insomnia 12/06/2014   Hypomagnesemia    Other fatigue 11/18/2014   Internal hemorrhoid 10/01/2014   Erectile dysfunction 12/18/7626   Alcoholic cirrhosis of liver without ascites ([place]) 07/28/2014   (HFpEF) heart failure with preserved ejection fraction ([place]) 11/01/2013   Essential hypertension 06/04/2013   OSA on CPAP 07/11/2012   History of alcohol dependence ([place]) 07/11/2012   Thrombocytopenia ([place]) 12/15/2011   Morbid obesity with BMI of 40.0-44.9, adult ([place]) 11/16/2011   Routine general medical examination at a health care facility 09/22/2010   NEUROPATHY 05/27/2009   Pedal edema 09/22/2008   Dyslipidemia associated with type 2 diabetes mellitus ([place]) 02/15/2007   Allergic rhinitis 02/15/2007   GERD 02/15/2007    Immunization History  Administered Date(s) Administered   Hep A / Hep B 01/16/2015, 01/23/2015, 02/06/2015   Hepatitis B, adult 12/04/2015   Influenza Whole 03/24/2010   Influenza,inj,Quad PF,6+ Mos 04/22/2015, 04/06/2017, 04/23/2018, 04/23/2019, 04/07/2020   PFIZER(Purple Top)SARS-COV-2 Vaccination 11/07/2019, 11/28/2019, 05/07/2020, 12/04/2020   PNEUMOCOCCAL CONJUGATE-20 02/15/2021   Pneumococcal Polysaccharide-23 04/22/2015   Td 05/01/2007   Tdap 12/14/2015, 10/05/2020    Conditions to be addressed/monitored:  Hyperlipidemia and Diabetes  Care Plan : [place]  Updates made by Debbora Dus, [place] since 03/02/2021 12:00 AM     Problem: CHL AMB "PATIENT-SPECIFIC PROBLEM"      Long-Range Goal: Disease Management   Start Date: 03/02/2021  Priority: High  Note:   Current Barriers:  Unable to achieve control of diabetes Lantus cost is  unaffordable   Pharmacist Clinical Goal(s):  Patient will contact provider office for questions/concerns as evidenced notation of same in electronic health  record through collaboration with PharmD and provider.   Interventions: 1:1 collaboration with Ria Bush, MD regarding development and update of comprehensive plan of care as evidenced by provider attestation and co-signature Inter-disciplinary care team collaboration (see longitudinal plan of care) Comprehensive medication review performed; medication list updated in electronic medical record  Hyperlipidemia: (LDL goal < 100) -Controlled - refills timely, LDL 38 but TG elevated above goal secondary to DM. -Current treatment: Lovastatin 20 mg - 1 tablet daily at bedtime  -Medications previously tried: none  -Recommended to continue current medication  Diabetes (A1c goal <8%) -Not ideally controlled - A1c 11.5 02/08/21, Pt restarted Jardiance ~02/03/21  due to cost. Approved 8/5 through end of 2022 with [facility] cares cost assistance.  -He started Trulicity around 5/0/75. Approved through [place]. Takes on $Remove'Sunday evening. Has noticed he eating a little bit less. Usually eats a small breakfast and then supper. Feels like he has lost some weight. Denies n/v. -Current medications: Trulicity 0.75mg/0.5 mL - Inject once weekly  Jardiance 10mg - Take 1 tablet daily  Lantus 100unit/mL - Injects 102 units daily at bedtime (each pen lasts 3 days) Metformin XR 500 mg - Take 1 tablet at breakfast -Medications previously tried: none   -Current home glucose readings -  fasting glucose: morning 9/5 - 195, 9/6 - 201 (ate half caramel cake night before) post prandial glucose: n/a -Denies hypoglycemic/hyperglycemic symptoms -Educated on Reviewed tolerance of Trulicity and discussed dose increase to 1.5 mg weekly -We discussed insulin administration. He is unsure if administering appropriately. He is using the correct dose, injecting into abdomen  and rotating site. -Recommend - increase Trulicity to 1.5 mg weekly. He got a 90 DS last month so we will finish this out first. Follow up in 1 month. Bring insulin to Dr. Ellison to verify correct admin. Apply for Lantus PAP. Text patient when PAP is ready to sign.  Patient Goals/Self-Care Activities Patient will:  - take medications as prescribed  Follow Up Plan: Telephone follow up appointment with care management team member scheduled for:  30 days -CMA to complete Lantus PAP for patient to come in and sign     Medication Assistance:  Trulicity and Jardiance obtained through respective medication assistance program.  Enrollment ends 06/26/21.  Compliance/Adherence/Medication fill history: Care Gaps: None  Star-Rating Drugs: Medication:                Last Fill:         Day Supply Metformin XR 500mg  01/19/21           90 Lantus 100unit            11/09/20            90 Trulicity                        01/25/21              90 (PAP)  Jardiance 10mg          12/03/20              90 (PAP) Losartan 50mg            12/10/20            90 Lovastatin 20mg         12/14/20            90'sixMcIy$   Patient's preferred pharmacy is:  [id number] [place], [place] [facility]  [facility] [address] [place] [address] Phone: [phone] Fax: [phone]  [facility] - [place], [place] [address] Phone: [phone] Fax: [phone]  [id number] [place], [place] - [place] [address] [place] [address] Phone: [phone] Fax: [phone]  Care Plan and Follow Up Patient Decision:  Patient agrees to Care Plan and Follow-up.  Debbora Dus, PharmD Clinical Pharmacist [place] [facility] at [facility] [phone]

## 2021-03-02 NOTE — Telephone Encounter (Signed)
LVM for pt to rtn my call to r/s appt with NHA on 03/24/21.

## 2021-03-02 NOTE — Patient Instructions (Signed)
Dear [name],  Below is a summary of the goals we discussed during our follow up appointment on March 02, 2021. Please contact me anytime with questions or concerns.   Visit Information  Patient Care Plan: CCM Pharmacy Care Plan     Problem Identified: CHL AMB "PATIENT-SPECIFIC PROBLEM"      Long-Range Goal: Disease Management   Start Date: 03/02/2021  Priority: High  Note:   Current Barriers:  Unable to achieve control of diabetes Lantus cost is unaffordable   Pharmacist Clinical Goal(s):  Patient will contact provider office for questions/concerns as evidenced notation of same in electronic health record through collaboration with PharmD and provider.   Interventions: 1:1 collaboration with Ria Bush, MD regarding development and update of comprehensive plan of care as evidenced by provider attestation and co-signature Inter-disciplinary care team collaboration (see longitudinal plan of care) Comprehensive medication review performed; medication list updated in electronic medical record  Hyperlipidemia: (LDL goal < 100) -Controlled - refills timely, LDL 38 but TG elevated above goal secondary to DM. -Current treatment: Lovastatin 20 mg - 1 tablet daily at bedtime  -Medications previously tried: none  -Recommended to continue current medication  Diabetes (A1c goal <8%) -Not ideally controlled - A1c 11.5 02/08/21, Pt restarted Jardiance ~02/03/21  due to cost. Approved 8/5 through end of 2022 with BI cares cost assistance.  -He started Trulicity around [id number]. Approved through [place]. Takes on Sunday evening. Has noticed he eating a little bit less. Usually eats a small breakfast and then supper. Feels like he has lost some weight. Denies n/v. -Current medications: Trulicity 0.'75mg'$ /0.5 mL - Inject once weekly  Jardiance '10mg'$  - Take 1 tablet daily  Lantus 100unit/mL - Injects 102 units daily at bedtime (each pen lasts 3 days) Metformin XR 500 mg - Take 1  tablet at breakfast -Medications previously tried: none   -Current home glucose readings -  fasting glucose: morning 9/5 - 195, 9/6 - 201 (ate half caramel cake night before) post prandial glucose: n/a -Denies hypoglycemic/hyperglycemic symptoms -Educated on Reviewed tolerance of Trulicity and discussed dose increase to 1.5 mg weekly -We discussed insulin administration. He is unsure if administering appropriately. He is using the correct dose, injecting into abdomen and rotating site. -Recommend - increase Trulicity to 1.5 mg weekly. He got a 90 DS last month so we will finish this out first. Follow up in 1 month. Bring insulin to Dr. Loanne Drilling to verify correct admin. Apply for Lantus PAP. Text patient when PAP is ready to sign.  Patient Goals/Self-Care Activities Patient will:  - take medications as prescribed  Follow Up Plan: Telephone follow up appointment with care management team member scheduled for:  30 days -CMA to complete Lantus PAP for patient to come in and sign      Patient verbalizes understanding of instructions provided today and agrees to view in [place].   Debbora Dus, PharmD Clinical Pharmacist [place] [facility] at [facility] [phone]

## 2021-03-10 ENCOUNTER — Telehealth: Payer: Self-pay | Admitting: *Deleted

## 2021-03-10 NOTE — Telephone Encounter (Addendum)
Spoke with Daphane Shepherd at [place], NP office regarding clearance for Prostate Biopsy 03/16/21 sent note to nurse [place]-Clearance faxed 02/24/21-refaxed 2nd time today to [phone]-marked urgently.

## 2021-03-11 NOTE — Telephone Encounter (Signed)
Received medical clearance (scanned in chart). Spoke with patient. Advised pt to stop ASA today. Pt verbalized understanding.

## 2021-03-15 ENCOUNTER — Other Ambulatory Visit: Payer: Self-pay | Admitting: Family Medicine

## 2021-03-15 ENCOUNTER — Encounter: Payer: Self-pay | Admitting: Family Medicine

## 2021-03-15 MED ORDER — LOSARTAN POTASSIUM 50 MG PO TABS: 50.0000 mg | ORAL_TABLET | Freq: Every day | ORAL | 3 refills | Status: DC

## 2021-03-15 MED ORDER — LOVASTATIN 20 MG PO TABS: 20.0000 mg | ORAL_TABLET | Freq: Every day | ORAL | 3 refills | Status: DC

## 2021-03-15 MED ORDER — OMEPRAZOLE 40 MG PO CPDR: 40.0000 mg | DELAYED_RELEASE_CAPSULE | Freq: Every day | ORAL | 3 refills | Status: DC

## 2021-03-15 NOTE — Telephone Encounter (Signed)
E-scribed refills to [facility] order pharmacy.

## 2021-03-15 NOTE — Progress Notes (Signed)
   03/16/21  CC:  Chief Complaint  Patient presents with   Prostate Biopsy     HPI: Colton Porter is a 55 y.o. male with a personal history of elevated PSA, who presents today for a prostate biopsy.   His most recent PSA on 02/24/2021 was 16.8 which was elevated from his previous PSA on 02/08/2021 which was 14.93.   He had an abnormal rectal exam on 02/25/2021 on his right side.   PSA trend: Component PSA  Latest Ref Rng & Units 0.10 - 4.00 ng/mL  10/03/2016 2.07  10/06/2017 2.66  10/15/2018 3.99  01/22/2020 8.22 (H)  02/08/2021 14.93 (H)   Component Prostate Specific Ag, Serum  Latest Ref Rng & Units 0.0 - 4.0 ng/mL  02/24/2021 16.8 (H)     Vitals:   03/16/21 0838  BP: 131/85  Pulse: 89  NED. A&Ox3.   No respiratory distress   Abd soft, NT, ND Normal external genitalia with patent urethral meatus  Prostate Biopsy Procedure   Informed consent was obtained after discussing risks/benefits of the procedure.  A time out was performed to ensure correct patient identity.  Pre-Procedure: - Last PSA Level:  Lab Results  Component Value Date   PSA 14.93 (H) 02/08/2021   PSA 8.22 (H) 01/22/2020   PSA 3.99 10/15/2018   - Gentamicin given prophylactically - Levaquin 500 mg administered PO -Transrectal Ultrasound performed revealing a 49.4 gm prostate -No significant hypoechoic or median lobe noted  Procedure: - Prostate block performed using 10 cc 1% lidocaine and biopsies taken from sextant areas, a total of 12 under ultrasound guidance.  Post-Procedure: - Patient tolerated the procedure well - He was counseled to seek immediate medical attention if experiences any severe pain, significant bleeding, or fevers - Return in one week to discuss biopsy results   I,Kailey Littlejohn,acting as a scribe for Hollice Espy, MD.,have documented all relevant documentation on the behalf of Hollice Espy, MD,as directed by  Hollice Espy, MD while in the presence of Hollice Espy, MD.  I have reviewed the above documentation for accuracy and completeness, and I agree with the above.   Hollice Espy, MD

## 2021-03-16 ENCOUNTER — Ambulatory Visit: Payer: Medicare HMO | Admitting: Urology

## 2021-03-16 ENCOUNTER — Other Ambulatory Visit: Payer: Self-pay

## 2021-03-16 VITALS — BP 131/85 | HR 89 | Ht 66.0 in | Wt 257.0 lb

## 2021-03-16 DIAGNOSIS — R972 Elevated prostate specific antigen [PSA]: Secondary | ICD-10-CM | POA: Diagnosis not present

## 2021-03-16 MED ORDER — LEVOFLOXACIN 500 MG PO TABS
500.0000 mg | ORAL_TABLET | Freq: Once | ORAL | Status: AC
  Administered 2021-03-16: 500 mg via ORAL

## 2021-03-16 MED ORDER — GENTAMICIN SULFATE 40 MG/ML IJ SOLN
80.0000 mg | Freq: Once | INTRAMUSCULAR | Status: AC
  Administered 2021-03-16: 80 mg via INTRAMUSCULAR

## 2021-03-16 NOTE — Patient Instructions (Signed)
Transrectal Ultrasound-Guided Prostate Biopsy, Care After This sheet gives you information about how to care for yourself after your procedure. Your doctor may also give you more specific instructions. If youhave problems or questions, contact your doctor. What can I expect after the procedure? After the procedure, it is common to have: Pain and discomfort in your butt, especially while sitting. Pink-colored pee (urine), due to small amounts of blood in the pee. Burning while peeing (urinating). Blood in your poop (stool). Bleeding from your butt. Blood in your semen. Follow these instructions at home: Medicines Take over-the-counter and prescription medicines only as told by your doctor. If you were prescribed antibiotic medicine, take it as told by your doctor. Do not stop taking the antibiotic even if you start to feel better. Activity  Do not drive for 24 hours if you were given a medicine to help you relax (sedative) during your procedure. Return to your normal activities as told by your doctor. Ask your doctor what activities are safe for you. Ask your doctor when it is okay for you to have sex. Do not lift anything that is heavier than 10 lb (4.5 kg), or the limit that you are told, until your doctor says that it is safe.  General instructions  Drink enough water to keep your pee pale yellow. Watch your pee, poop, and semen for new bleeding or bleeding that gets worse. Keep all follow-up visits as told by your doctor. This is important.  Contact a doctor if you: Have blood clots in your pee or poop. Notice that your pee smells bad or unusual. Have very bad belly pain. Have trouble peeing. Notice that your lower belly feels firm. Have blood in your pee for more than 2 weeks after the procedure. Have blood in your semen for more than 2 months after the procedure. Have problems getting an erection. Feel sick to your stomach (nauseous) or throw up (vomit). Have new or worse  bleeding in your pee, poop, or semen. Get help right away if you: Have a fever or chills. Have bright red pee. Have very bad pain that does not get better with medicine. Cannot pee. Summary After this procedure, it is common to have pain and discomfort around your butt, especially while sitting. You may have blood in your pee and poop. It is common to have blood in your semen for 1-2 months. If you were prescribed antibiotic medicine, take it as told by your doctor. Do not stop taking the antibiotic even if you start to feel better. Get help right away if you have a fever or chills. This information is not intended to replace advice given to you by your health care provider. Make sure you discuss any questions you have with your healthcare provider. Document Revised: 04/27/2020 Document Reviewed: 02/27/2020 Elsevier Patient Education  2022 Elsevier Inc.  

## 2021-03-17 LAB — SURGICAL PATHOLOGY

## 2021-03-22 ENCOUNTER — Other Ambulatory Visit: Payer: Self-pay

## 2021-03-22 ENCOUNTER — Ambulatory Visit (INDEPENDENT_AMBULATORY_CARE_PROVIDER_SITE_OTHER): Payer: Medicare HMO | Admitting: Endocrinology

## 2021-03-22 VITALS — BP 130/80 | HR 80 | Ht 66.0 in | Wt 261.4 lb

## 2021-03-22 DIAGNOSIS — E1169 Type 2 diabetes mellitus with other specified complication: Secondary | ICD-10-CM

## 2021-03-22 DIAGNOSIS — Z794 Long term (current) use of insulin: Secondary | ICD-10-CM | POA: Diagnosis not present

## 2021-03-22 LAB — POCT GLYCOSYLATED HEMOGLOBIN (HGB A1C): Hemoglobin A1C: 8.6 % — AB (ref 4.0–5.6)

## 2021-03-22 NOTE — Progress Notes (Signed)
Subjective:    Patient ID: Colton Porter, male    DOB: Apr 07, 1966, 55 y.o.   MRN: [id number]  HPI Pt returns for f/u of diabetes mellitus:  DM type: Insulin-requiring type 2 Dx'ed: 9024 Complications: CRI Therapy: insulin since 2019. Trulicity, and Jardiance.  DKA: never Severe hypoglycemia: never.   Pancreatitis: never Pancreatic imaging: normal on 2021 CT SDOH: Pt says he wants to minimize number of meds; memory loss complicates rx (so he is not a candidate for multiple daily injections); PCP office is doing pt assist forms.   Other: he does not check cbg's.   Interval history: Pt says he takes insulin as rx'ed.  pt states he feels well in general.  Pt says PCP office has assumed care of DM.   Past Medical History:  Diagnosis Date   Alcohol dependence ([place]) 0/97/3532   Alcoholic cirrhosis of liver with ascites ([place]) 07/2014   s./p transplant 12/2015   Allergy    Anemia    Cellulitis of left leg    Chronic diastolic heart failure ([place]) 11/01/2013   CKD (chronic kidney disease) stage 3, GFR 30-59 ml/min ([place]) 11/04/2015   Diabetes mellitus without complication (HCC)    Type II   GERD (gastroesophageal reflux disease)    Hyperlipidemia    Hypertension    Neuropathy    OSA on CPAP 07/11/2012   HST 07/2013:  AHI 39/hr.     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  Irrigation debridement of right thumb open distal phalanx fracture 2.  Repair of right thumb skin and nailbed laceration ;  Surgeon: Leanora Cover, MD;  Location: [facility];  Service: Orthopedics;  Laterality: Right;   LEFT AND RIGHT HEART CATHETERIZATION WITH CORONARY  ANGIOGRAM N/A 06/10/2013   Procedure: LEFT AND RIGHT HEART CATHETERIZATION WITH CORONARY ANGIOGRAM;  Surgeon: Blane Ohara, MD;  Location: [facility] CATH LAB;  Service: Cardiovascular;  Laterality: N/A;   LIVER TRANSPLANTATION  99/2426   alcoholic cirrhosis ([facility] at [facility])    Social History   Socioeconomic History   Marital status: Married    Spouse name: Not on file   Number of children: 2   Years of education: Not on file   Highest education level: Not on file  Occupational History   Occupation: Investment banker, corporate    Comment: no work lately  Tobacco Use   Smoking status: Never   Smokeless tobacco: Former    Types: Nurse, children's Use: Never used  Substance and Sexual Activity   Alcohol use: No    Alcohol/week: 0.0 standard drinks    Comment: 4-6 drinks daily - NO ETOH SINCE APRIL 2016   Drug use: Not Currently    Comment: remote use of marijuana in the past, has since quit   Sexual activity: Not on file  Other Topics Concern   Not on file  Social History Narrative    Lives with wife   Occupation: full disability after cirrhosis dx, some working    Activity: some yardwork   Diet: good water, good fruit intake, lots of red meats   Social  Determinants of Health   Financial Resource Strain: Medium Risk   Difficulty of Paying Living Expenses: Somewhat hard  Food Insecurity: Not on file  Transportation Needs: Not on file  Physical Activity: Not on file  Stress: Not on file  Social Connections: Not on file  Intimate Partner Violence: Not on file    Current Outpatient Medications on File Prior to Visit  Medication Sig Dispense Refill   Accu-Chek FastClix Lancets MISC USE THREE TIMES DAILY TO CHECK SUGARS E65.11, INSULIN USE 102 each 12   ACCU-CHEK GUIDE test strip USE AS INSTRUCTED TO CHECK SUGARS THREE TIMES DAILY E11.65, INSULIN USE 100 strip 12   acetaminophen (TYLENOL) 500 MG tablet Take 500 mg by mouth every 6 (six) hours as needed for mild pain.      allopurinol (ZYLOPRIM) 300 MG tablet Take 1 tablet (300 mg total) by mouth daily. 90 tablet 0   ASPIR-LOW 81 MG EC tablet Take 1 tablet (81 mg total) by mouth daily. 90 tablet 3   B-D UF III MINI PEN NEEDLES 31G X 5 MM MISC USE TO INJECT INSULIN DAILY 100 each 3   Blood Glucose Monitoring Suppl (ACCU-CHEK GUIDE) w/Device KIT 1 Units by Does not apply route as directed. 1 kit 0   Cholecalciferol (VITAMIN D3) 2000 units TABS Take 1 tablet by mouth at bedtime.     docusate sodium (COLACE) 100 MG capsule Take 100 mg by mouth daily as needed for mild constipation.      Dulaglutide (TRULICITY) 6.96 EX/5.2WU SOPN Inject into the skin. Inject 0.75 mg once weekly     empagliflozin (JARDIANCE) 10 MG TABS tablet Take 1 tablet (10 mg total) by mouth daily before breakfast. 90 tablet 1   escitalopram (LEXAPRO) 10 MG tablet TAKE 1 TABLET EVERY DAY 90 tablet 0   fluticasone (FLONASE) 50 MCG/ACT nasal spray SPRAY 2 SPRAYS INTO EACH NOSTRIL EVERY DAY 48 mL 0   furosemide (LASIX) 40 MG tablet Take 1 tablet (40 mg total) by mouth daily. 90 tablet 3   glucose blood (ACCU-CHEK GUIDE) test strip Check sugars three times daily and as needed when feeling ill E11.65, insulin use 100 each 11   insulin glargine (LANTUS SOLOSTAR) 100 UNIT/ML Solostar Pen Inject 100 Units into the skin every morning. And pen needles 1/day 105 mL 3   loratadine (CLARITIN) 10 MG tablet Take 10 mg by mouth daily.     losartan (COZAAR) 50 MG tablet Take 1 tablet (50 mg total) by mouth daily. 90 tablet 3   lovastatin (MEVACOR) 20 MG tablet Take 1 tablet (20 mg total) by mouth at bedtime. 90 tablet 3   Magnesium 500 MG TABS Take 1 tablet (500 mg total) by mouth every Monday, Wednesday, and Friday. 30 tablet    metFORMIN (GLUCOPHAGE-XR) 500 MG 24 hr tablet TAKE 1 TABLET BY MOUTH EVERY DAY WITH BREAKFAST 90 tablet 0   Multiple Vitamins-Minerals (CVS SPECTRAVITE PO) Take 1 tablet by mouth at bedtime.     mycophenolate (MYFORTIC) 360 MG TBEC EC tablet  Take 360 mg by mouth 2 (two) times daily.     Omega-3 Fatty Acids (FISH OIL PO) Take 1 tablet by mouth at bedtime.     omeprazole (PRILOSEC) 40 MG capsule Take 1 capsule (40 mg total) by mouth daily. 90 capsule 3   oxyCODONE (ROXICODONE) 5 MG immediate release tablet 1-2 tabs PO q6 hours prn pain 20 tablet 0   tacrolimus (PROGRAF) 1 MG capsule Take 1-2 mg by mouth See  admin instructions. Take 2 mg by mouth in the morning, then take 1 mg  by mouth in the evening     No current facility-administered medications on file prior to visit.    Allergies  Allergen Reactions   Tolmetin Rash    cirrhosis   Ambien [Zolpidem Tartrate] Other (See Comments)    Over-toxicity from liver failure   Morphine And Related     Hallucinations.   Hydrochlorothiazide W-Triamterene Other (See Comments)    REACTION: dizzy, nausea   Lisinopril Other (See Comments)    REACTION: cough, decreased libido    Family History  Problem Relation Age of Onset   Stroke Mother    Emphysema Mother    Hypertension Father    Heart disease Father    Emphysema Father    Lung cancer Maternal Aunt    Stroke Paternal Grandmother    Heart attack Neg Hx    Diabetes Neg Hx     BP 130/80 (BP Location: Right Arm, Patient Position: Sitting, Cuff Size: Large)   Pulse 80   Ht _0  (1.676 m)   Wt 261 lb 6.4 oz (118.6 kg)   SpO2 95%   BMI 42.19 kg/m    Review of Systems     Objective:   Physical Exam Pulses: dorsalis pedis intact bilat.   MSK: no deformity of the feet CV: trace bilat leg edema.   Skin:  no ulcer on the feet.  normal color and temp on the feet.   Neuro: sensation is intact to touch on the feet.     Lab Results  Component Value Date   HGBA1C 8.6 (A) 03/22/2021      Assessment & Plan:  Insulin-requiring type 2 DM: uncontrolled.  We discussed risks of having different providers adjust meds.  Please continue the same 4 diabetes medications.  He will follow up with PCP.

## 2021-03-22 NOTE — Progress Notes (Signed)
03/23/21 11:45 AM   Colton Porter 1965/11/21 [id number]  Referring provider:  Ria Bush, MD [address] [place],  [place] [address] Chief Complaint  Patient presents with   Elevated PSA     HPI: Colton Porter is a 55 y.o.male with a personal history of elevated PSA , who presents today prostate biopsy results.  He is accompanied today by his wife.  His most recent PSA on 02/24/2021 was 16.8  On 02/25/2021, he was noted to have an abnormal rectal exam on his right side.   His prostate biopsy surgical pathology showed Gleason 3+3 and Gleason 3+4 as below, R>L involvement.  He is doing well today and is accompanied by his wife with no new urological symptoms he also reports that he usually urinates 10 times a day primarily after he takes Lasix.  He denies any baseline erectile dysfunction.   Surgical Pathology:   DIAGNOSIS:  Diagnostic Map    Benign  Atypical  Malignant  HGPIN     Diagnostic Summary   [A] PROSTATE, LEFT BASE:   NEGATIVE FOR MALIGNANCY.   [B] PROSTATE, LEFT MID:   ACINAR ADENOCARCINOMA, GLEASON 3+3=6  (GG 1),  INVOLVING 1 CORES, MEASURING 4  MM ( 25%).   [C] PROSTATE, LEFT APEX:   NEGATIVE FOR MALIGNANCY. INSUFFICIENT  TISSUE.   [D] PROSTATE, RIGHT BASE:   ACINAR ADENOCARCINOMA, GLEASON 3+4=7  (GG  2), INVOLVING 1 CORES, MEASURING 7  MM ( 88%). DISCONTINUOUS.  CRIBRIFORM PATTERN 4 PRESENT.  LESS THAN 10% PATTERN 4.   [E] PROSTATE, RIGHT MID:   ACINAR ADENOCARCINOMA, GLEASON 3+4=7  (GG  2), INVOLVING 1 CORES, MEASURING 10  MM ( 83%). LESS THAN 10% PATTERN 4.   [F] PROSTATE, RIGHT APEX:   ACINAR ADENOCARCINOMA, GLEASON 3+4=7  (GG  2), INVOLVING 1 CORES, MEASURING 6  MM ( 75%). LESS THAN 10% PATTERN 4.  PERINEURAL INVASION IS PRESENT.   [G] PROSTATE, LEFT LATERAL BASE:   ACINAR ADENOCARCINOMA, GLEASON 3+3=6  (GG 1), INVOLVING 1 CORES, MEASURING 1  MM ( 16%).   [H] PROSTATE, LEFT LATERAL MID:   ACINAR ADENOCARCINOMA, GLEASON 3+3=6   (GG 1), INVOLVING 1 CORES, MEASURING 10  MM ( 66%). DISCONTINUOUS.   [I] PROSTATE, LEFT LATERAL APEX:   ACINAR ADENOCARCINOMA, GLEASON 3+3=6  (GG 1), INVOLVING 1 CORES, MEASURING 2  MM ( 20%).   [J] PROSTATE, RIGHT LATERAL BASE:   NEGATIVE FOR MALIGNANCY.   [K] PROSTATE, RIGHT LATERAL MID:   ACINAR ADENOCARCINOMA, GLEASON 3+3=6  (GG 1), INVOLVING 1 CORES, MEASURING 0.2  MM ( 2%).   [L] PROSTATE, RIGHT LATERAL APEX:   ACINAR ADENOCARCINOMA, GLEASON  3+4=7  (GG 2), INVOLVING 1 CORES, MEASURING 4  MM ( 50%). LESS THAN 10%  PATTERN 4.  PERINEURAL INVASION IS PRESENT.       PSA Trend   Component PSA  Latest Ref Rng & Units 0.10 - 4.00 ng/mL  10/03/2016 2.07  10/06/2017 2.66  10/15/2018 3.99  01/22/2020 8.22 (H)  02/08/2021 14.93 (H)    Component Prostate Specific Ag, Serum  Latest Ref Rng & Units 0.0 - 4.0 ng/mL  02/24/2021 16.8 (H)     PMH: Past Medical History:  Diagnosis Date   Alcohol dependence ([place]) 12/15/3084   Alcoholic cirrhosis of liver with ascites ([place]) 07/2014   s./p transplant 12/2015   Allergy    Anemia    Cellulitis of left leg    Chronic diastolic heart failure ([place]) 11/01/2013  CKD (chronic kidney disease) stage 3, GFR 30-59 ml/min ([facility]) 11/04/2015   Diabetes mellitus without complication (HCC)    Type II   GERD (gastroesophageal reflux disease)    Hyperlipidemia    Hypertension    Neuropathy    OSA on CPAP 07/11/2012   HST 07/2013:  AHI 39/hr.     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  Irrigation debridement of right thumb open distal phalanx fracture 2.  Repair of right thumb skin and  nailbed laceration ;  Surgeon: Betha Loa, MD;  Location: [facility] OR;  Service: Orthopedics;  Laterality: Right;   LEFT AND RIGHT HEART CATHETERIZATION WITH CORONARY ANGIOGRAM N/A 06/10/2013   Procedure: LEFT AND RIGHT HEART CATHETERIZATION WITH CORONARY ANGIOGRAM;  Surgeon: Micheline Chapman, MD;  Location: [facility] CATH LAB;  Service: Cardiovascular;  Laterality: N/A;   LIVER TRANSPLANTATION  12/2015   alcoholic cirrhosis ([facility])    Home Medications:  Allergies as of 03/23/2021       Reactions   Tolmetin Rash   cirrhosis   Ambien [zolpidem Tartrate] Other (See Comments)   Over-toxicity from liver failure   Morphine And Related    Hallucinations.   Hydrochlorothiazide W-triamterene Other (See Comments)   REACTION: dizzy, nausea   Lisinopril Other (See Comments)   REACTION: cough, decreased libido        Medication List        Accurate as of March 23, 2021 11:45 AM. If you have any questions, ask your nurse or doctor.          STOP taking these medications    amoxicillin 500 MG capsule Commonly known as: AMOXIL       TAKE these medications    Accu-Chek FastClix Lancets Misc USE THREE TIMES DAILY TO CHECK SUGARS E65.11, INSULIN USE   Accu-Chek Guide w/Device Kit 1 Units by Does not apply route as directed.   acetaminophen 500 MG tablet Commonly known as: TYLENOL Take 500 mg by mouth every 6 (six) hours as needed for mild pain.   allopurinol 300 MG tablet Commonly known as: ZYLOPRIM Take 1 tablet (300 mg total) by mouth daily.   Aspir-Low 81 MG EC tablet Generic drug: aspirin Take 1 tablet (81 mg total) by mouth daily.   B-D UF III MINI PEN NEEDLES 31G X 5 MM Misc Generic drug: Insulin Pen Needle USE TO INJECT INSULIN DAILY   CVS SPECTRAVITE PO Take 1 tablet by mouth at bedtime.   docusate sodium 100 MG capsule Commonly known as: COLACE Take 100 mg by mouth daily as needed for mild constipation.   empagliflozin 10 MG Tabs tablet Commonly  known as: Jardiance Take 1 tablet (10 mg total) by mouth daily before breakfast.   escitalopram 10 MG tablet Commonly known as: LEXAPRO TAKE 1 TABLET EVERY DAY   FISH OIL PO Take 1 tablet by mouth at bedtime.   fluticasone 50 MCG/ACT nasal spray Commonly known as: FLONASE SPRAY 2 SPRAYS INTO EACH NOSTRIL EVERY DAY   furosemide 40 MG tablet Commonly known as: LASIX Take 1 tablet (40 mg total) by mouth daily.  glucose blood test strip Commonly known as: Accu-Chek Guide Check sugars three times daily and as needed when feeling ill E11.65, insulin use   Accu-Chek Guide test strip Generic drug: glucose blood USE AS INSTRUCTED TO CHECK SUGARS THREE TIMES DAILY E11.65, INSULIN USE   Lantus SoloStar 100 UNIT/ML Solostar Pen Generic drug: insulin glargine Inject 100 Units into the skin every morning. And pen needles 1/day   loratadine 10 MG tablet Commonly known as: CLARITIN Take 10 mg by mouth daily.   losartan 50 MG tablet Commonly known as: COZAAR Take 1 tablet (50 mg total) by mouth daily.   lovastatin 20 MG tablet Commonly known as: MEVACOR Take 1 tablet (20 mg total) by mouth at bedtime.   Magnesium 500 MG Tabs Take 1 tablet (500 mg total) by mouth every Monday, Wednesday, and Friday.   metFORMIN 500 MG 24 hr tablet Commonly known as: GLUCOPHAGE-XR TAKE 1 TABLET BY MOUTH EVERY DAY WITH BREAKFAST   mycophenolate 360 MG Tbec EC tablet Commonly known as: MYFORTIC Take 360 mg by mouth 2 (two) times daily.   omeprazole 40 MG capsule Commonly known as: PRILOSEC Take 1 capsule (40 mg total) by mouth daily.   oxyCODONE 5 MG immediate release tablet Commonly known as: Roxicodone 1-2 tabs PO q6 hours prn pain   tacrolimus 1 MG capsule Commonly known as: PROGRAF Take 1-2 mg by mouth See admin instructions. Take 2 mg by mouth in the morning, then take 1 mg  by mouth in the evening   Trulicity 7.18 ZB/0.1TA Sopn Generic drug: Dulaglutide Inject into the skin.  Inject 0.75 mg once weekly   Vitamin D3 50 MCG (2000 UT) Tabs Take 1 tablet by mouth at bedtime.        Allergies:  Allergies  Allergen Reactions   Tolmetin Rash    cirrhosis   Ambien [Zolpidem Tartrate] Other (See Comments)    Over-toxicity from liver failure   Morphine And Related     Hallucinations.   Hydrochlorothiazide W-Triamterene Other (See Comments)    REACTION: dizzy, nausea   Lisinopril Other (See Comments)    REACTION: cough, decreased libido    Family History: Family History  Problem Relation Age of Onset   Stroke Mother    Emphysema Mother    Hypertension Father    Heart disease Father    Emphysema Father    Lung cancer Maternal Aunt    Stroke Paternal Grandmother    Heart attack Neg Hx    Diabetes Neg Hx     Social History:  reports that he has never smoked. He has quit using smokeless tobacco.  His smokeless tobacco use included chew. He reports that he does not currently use drugs. He reports that he does not drink alcohol.   Physical Exam: BP (!) 151/89   Pulse 85   Ht $R'5\' 6"'Ne$  (1.676 m)   Wt 261 lb (118.4 kg)   BMI 42.13 kg/m   Constitutional:  Alert and oriented, No acute distress. HEENT: [place] AT, moist mucus membranes.  Trachea midline, no masses. Cardiovascular: No clubbing, cyanosis, or edema. Respiratory: Normal respiratory effort, no increased work of breathing. Skin: No rashes, bruises or suspicious lesions. Neurologic: Grossly intact, no focal deficits, moving all 4 extremities. Psychiatric: Normal mood and affect.  Laboratory Data:  Lab Results  Component Value Date   CREATININE 1.20 02/08/2021    Lab Results  Component Value Date   PSA 14.93 (H) 02/08/2021   PSA 8.22 (H) 01/22/2020   PSA  3.99 10/15/2018    Lab Results  Component Value Date   HGBA1C 8.6 (A) 03/22/2021     Assessment & Plan:  Prostate cancer  - Favorable intermediate risk, newly dx  - The patient was counseled about the natural history of prostate  cancer and the standard treatment options that are available for prostate cancer. It was explained to him how his age and life expectancy, clinical stage, Gleason score, and PSA affect his prognosis, the decision to proceed with additional staging studies, as well as how that information influences recommended treatment strategies. We discussed the roles for active surveillance, radiation therapy, surgical therapy, androgen deprivation, as well as ablative therapy options for the treatment of prostate cancer as appropriate to his individual cancer situation. We discussed the risks and benefits of these options with regard to their impact on cancer control and also in terms of potential adverse events, complications, and impact on quality of life particularly related to urinary, bowel, and sexual function. The patient was encouraged to ask questions throughout the discussion today and all questions were answered to his stated satisfaction. In addition, the patient was provided with and/or directed to appropriate resources and literature for further education about prostate cancer treatment options.  -Surgery is an option but he is a poor canddate due to BMI, comorbidity including cirrhosis and liver transplant, and past surgeries and history of immunosuppression  - Referred to radiation oncology to further discuss brachytherapy versus ER BT.  Discussed that this is used in commendation therapy with generally 6 months of ADT.  We also went ahead and discussed the possible side effects of ADT including low libido, cardiovascular issues, energy changes, change in habitus, etc.  - ct abdomen pelvis with contract ordered to rule out metastatic disease in the setting of higher volume disease and immunosuppression along with rapidly rising PSA  - Follow-up for Elig[place] in 4 weeks   I,Kailey Littlejohn,acting as a scribe for Hollice Espy, MD.,have documented all relevant documentation on the behalf of Hollice Espy, MD,as directed by  Hollice Espy, MD while in the presence of Hollice Espy, MD.  I have reviewed the above documentation for accuracy and completeness, and I agree with the above.   Hollice Espy, MD    [facility] [address] [place], [place] [address] [phone]  I spent 45 total minutes on the day of the encounter including pre-visit review of the medical record, face-to-face time with the patient, and post visit ordering of labs/imaging/tests.

## 2021-03-22 NOTE — Patient Instructions (Addendum)
check your blood sugar twice a day.  vary the time of day when you check, between before the 3 meals, and at bedtime.  also check if you have symptoms of your blood sugar being too high or too low.  please keep a record of the readings and bring it to your next appointment here (or you can bring the meter itself).  You can write it on any piece of paper.  please call us sooner if your blood sugar goes below 70, or if you have a lot of readings over 200.   Please continue the same medications.  Please follow up with your PCP for diabetes.

## 2021-03-23 ENCOUNTER — Ambulatory Visit: Payer: Medicare HMO | Admitting: Urology

## 2021-03-23 VITALS — BP 151/89 | HR 85 | Ht 66.0 in | Wt 261.0 lb

## 2021-03-23 DIAGNOSIS — C61 Malignant neoplasm of prostate: Secondary | ICD-10-CM | POA: Diagnosis not present

## 2021-03-23 NOTE — Patient Instructions (Signed)
Leuprolide injection What is this medication? LEUPROLIDE (loo PROE lide) is a man-made hormone. It is used to treat the symptoms of prostate cancer. This medicine may also be used to treat childrenwith early onset of puberty. It may be used for other hormonal conditions. This medicine may be used for other purposes; ask your health care provider orpharmacist if you have questions. COMMON BRAND NAME(S): Lupron What should I tell my care team before I take this medication? They need to know if you have any of these conditions: diabetes heart disease or previous heart attack high blood pressure high cholesterol pain or difficulty passing urine spinal cord metastasis stroke tobacco smoker an unusual or allergic reaction to leuprolide, benzyl alcohol, other medicines, foods, dyes, or preservatives pregnant or trying to get pregnant breast-feeding How should I use this medication? This medicine is for injection under the skin or into a muscle. You will be taught how to prepare and give this medicine. Use exactly as directed. Take your medicine at regular intervals. Do not take your medicine more often thandirected. It is important that you put your used needles and syringes in a special sharps container. Do not put them in a trash can. If you do not have a sharpscontainer, call your pharmacist or healthcare provider to get one. A special MedGuide will be given to you by the pharmacist with eachprescription and refill. Be sure to read this information carefully each time. Talk to your pediatrician regarding the use of this medicine in children. While this medicine may be prescribed for children as young as 8 years for selectedconditions, precautions do apply. Overdosage: If you think you have taken too much of this medicine contact apoison control center or emergency room at once. NOTE: This medicine is only for you. Do not share this medicine with others. What if I miss a dose? If you miss a  dose, take it as soon as you can. If it is almost time for yournext dose, take only that dose. Do not take double or extra doses. What may interact with this medication? Do not take this medicine with any of the following medications: chasteberry cisapride dronedarone pimozide thioridazine This medicine may also interact with the following medications: herbal or dietary supplements, like black cohosh or DHEA male hormones, like estrogens or progestins and birth control pills, patches, rings, or injections male hormones, like testosterone other medicines that prolong the QT interval (abnormal heart rhythm) This list may not describe all possible interactions. Give your health care provider a list of all the medicines, herbs, non-prescription drugs, or dietary supplements you use. Also tell them if you smoke, drink alcohol, or use illegaldrugs. Some items may interact with your medicine. What should I watch for while using this medication? Visit your doctor or health care professional for regular checks on your progress. During the first week, your symptoms may get worse, but then will improve as you continue your treatment. You may get hot flashes, increased bone pain, increased difficulty passing urine, or an aggravation of nerve symptoms. Discuss these effects with your doctor or health care professional, some ofthem may improve with continued use of this medicine. Male patients may experience a menstrual cycle or spotting during the first 2 months of therapy with this medicine. If this continues, contact your doctor orhealth care professional. This medicine may increase blood sugar. Ask your healthcare provider if changesin diet or medicines are needed if you have diabetes. What side effects may I notice from receiving this medication? Side   effects that you should report to your doctor or health care professionalas soon as possible: allergic reactions like skin rash, itching or hives,  swelling of the face, lips, or tongue breathing problems chest pain depression or memory disorders pain in your legs or groin pain at site where injected severe headache signs and symptoms of high blood sugar such as being more thirsty or hungry or having to urinate more than normal. You may also feel very tired or have blurry vision swelling of the feet and legs visual changes vomiting Side effects that usually do not require medical attention (report to yourdoctor or health care professional if they continue or are bothersome): breast swelling or tenderness decrease in sex drive or performance diarrhea hot flashes loss of appetite muscle, joint, or bone pains nausea redness or irritation at site where injected skin problems or acne This list may not describe all possible side effects. Call your doctor for medical advice about side effects. You may report side effects to FDA at1-800-FDA-1088. Where should I keep my medication? Keep out of the reach of children. Store below 25 degrees C (77 degrees F). Do not freeze. Protect from light. Do not use if it is not clear or if there are particles present. Throw away anyunused medicine after the expiration date. NOTE: This sheet is a summary. It may not cover all possible information. If you have questions about this medicine, talk to your doctor, pharmacist, orhealth care provider.  2022 Elsevier/Gold Standard (2019-05-15 10:57:41)  

## 2021-03-24 ENCOUNTER — Encounter: Payer: Self-pay | Admitting: Family Medicine

## 2021-03-24 ENCOUNTER — Ambulatory Visit: Payer: Medicare HMO

## 2021-03-25 ENCOUNTER — Telehealth: Payer: Self-pay | Admitting: Urology

## 2021-03-25 NOTE — Telephone Encounter (Signed)
Pt's wife called office to let us know they can't get pt in for CT scan until 10/20.  She wanted to know if Dr Erlene Quan wanted or needed it sooner, if she could order it STAT.

## 2021-03-26 DIAGNOSIS — E785 Hyperlipidemia, unspecified: Secondary | ICD-10-CM

## 2021-03-26 DIAGNOSIS — Z794 Long term (current) use of insulin: Secondary | ICD-10-CM

## 2021-03-26 DIAGNOSIS — E1169 Type 2 diabetes mellitus with other specified complication: Secondary | ICD-10-CM

## 2021-03-29 ENCOUNTER — Other Ambulatory Visit: Payer: Self-pay

## 2021-03-29 ENCOUNTER — Other Ambulatory Visit: Payer: Self-pay | Admitting: Family Medicine

## 2021-03-29 ENCOUNTER — Other Ambulatory Visit: Payer: Self-pay | Admitting: *Deleted

## 2021-03-29 ENCOUNTER — Ambulatory Visit
Admission: RE | Admit: 2021-03-29 | Discharge: 2021-03-29 | Disposition: A | Discharge status: Home / Self Care | Payer: Medicare HMO | Source: Ambulatory Visit | Attending: Radiation Oncology | Admitting: Radiation Oncology

## 2021-03-29 VITALS — BP 133/89 | HR 79 | Temp 97.0°F | Wt 266.0 lb

## 2021-03-29 DIAGNOSIS — I5032 Chronic diastolic (congestive) heart failure: Secondary | ICD-10-CM | POA: Insufficient documentation

## 2021-03-29 DIAGNOSIS — I130 Hypertensive heart and chronic kidney disease with heart failure and stage 1 through stage 4 chronic kidney disease, or unspecified chronic kidney disease: Secondary | ICD-10-CM | POA: Diagnosis not present

## 2021-03-29 DIAGNOSIS — Z794 Long term (current) use of insulin: Secondary | ICD-10-CM | POA: Insufficient documentation

## 2021-03-29 DIAGNOSIS — C61 Malignant neoplasm of prostate: Secondary | ICD-10-CM

## 2021-03-29 DIAGNOSIS — N183 Chronic kidney disease, stage 3 unspecified: Secondary | ICD-10-CM | POA: Diagnosis not present

## 2021-03-29 DIAGNOSIS — Z7985 Long-term (current) use of injectable non-insulin antidiabetic drugs: Secondary | ICD-10-CM | POA: Diagnosis not present

## 2021-03-29 DIAGNOSIS — Z7982 Long term (current) use of aspirin: Secondary | ICD-10-CM | POA: Insufficient documentation

## 2021-03-29 DIAGNOSIS — R972 Elevated prostate specific antigen [PSA]: Secondary | ICD-10-CM | POA: Diagnosis not present

## 2021-03-29 DIAGNOSIS — K7031 Alcoholic cirrhosis of liver with ascites: Secondary | ICD-10-CM | POA: Diagnosis not present

## 2021-03-29 DIAGNOSIS — Z87891 Personal history of nicotine dependence: Secondary | ICD-10-CM | POA: Diagnosis not present

## 2021-03-29 DIAGNOSIS — E1122 Type 2 diabetes mellitus with diabetic chronic kidney disease: Secondary | ICD-10-CM | POA: Diagnosis not present

## 2021-03-29 DIAGNOSIS — F1011 Alcohol abuse, in remission: Secondary | ICD-10-CM | POA: Insufficient documentation

## 2021-03-29 NOTE — Consult Note (Signed)
NEW PATIENT EVALUATION  Name: Colton Porter  MRN: [id number]  Date:   03/29/2021     DOB: 11/06/1965   This 55 y.o. 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  REFERRING PHYSICIAN: Ria Bush, MD  CHIEF COMPLAINT:  Chief Complaint  Patient presents with   Consult    DIAGNOSIS: The encounter diagnosis was Prostate cancer [facility]).   PREVIOUS INVESTIGATIONS:  CT scan and bone scan ordered Pathology report reviewed Clinical notes reviewed  HPI: Patient is a 55 year old male who presented with a gradually increasing PSA.  In August it was 16.8.  On rectal exam he was noted to have right-sided nodularity consistent with malignancy.  Patient underwent transrectal ultrasound-guided biopsy showing a combination of Gleason 6 (3+3) and Gleason 7 (3+4).  8 of 12 cores were positive for malignancy.  Patient has very little lower urinary tract symptoms no specific specific urgency frequency or nocturia.  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  He is seen today for radiation oncology opinion.  PLANNED TREATMENT REGIMEN: Image guided IMRT radiation therapy plus ADT therapy  PAST MEDICAL HISTORY:  has a past medical history of Alcohol dependence ([place]) (5/68/1275), Alcoholic cirrhosis of liver with ascites ([place]) (07/2014), Allergy, Anemia, Cellulitis of left leg, Chronic diastolic heart failure ([place]) (11/01/2013), CKD (chronic kidney disease) stage 3, GFR 30-59 ml/min ([place]) (11/04/2015), Diabetes mellitus without complication ([place]), GERD (gastroesophageal reflux disease), Hyperlipidemia, Hypertension, Neuropathy, OSA on CPAP (07/11/2012), Pneumonia due to COVID-19 virus (06/2019), and  Thrombocytopenia ([place]) (12/15/2011).    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  Irrigation debridement of right thumb open distal phalanx fracture 2.  Repair of right thumb skin and nailbed laceration ;  Surgeon: Leanora Cover, MD;  Location: [place];  Service: Orthopedics;  Laterality: Right;   LEFT AND RIGHT HEART CATHETERIZATION WITH CORONARY ANGIOGRAM N/A 06/10/2013   Procedure: LEFT AND RIGHT HEART CATHETERIZATION WITH CORONARY ANGIOGRAM;  Surgeon: Blane Ohara, MD;  Location: [facility] C[facility] LAB;  Service: Cardiovascular;  Laterality: N/A;   LIVER TRANSPLANTATION  17/0017   alcoholic cirrhosis ([facility])    FAMILY HISTORY: family history includes Emphysema in his father and mother; Heart disease in his father; Hypertension in his father; Lung cancer in his maternal aunt; Stroke in his mother and paternal grandmother.  SOCIAL HISTORY:  reports that he has never smoked. He has quit using smokeless tobacco.  His smokeless tobacco use included chew. He reports that he does not currently use drugs. He reports that he does not drink alcohol.  ALLERGIES: Tolmetin, Ambien [zolpidem tartrate], Morphine and related, Hydrochlorothiazide w-triamterene, and Lisinopril  MEDICATIONS:  Current Outpatient Medications  Medication Sig Dispense Refill   acetaminophen (TYLENOL) 500 MG tablet Take 500 mg by mouth every 6 (six) hours as needed for mild pain.     ASPIR-LOW 81 MG EC tablet Take 1 tablet (81 mg total) by mouth daily. 90 tablet 3  B-D UF III MINI PEN NEEDLES 31G X 5 MM MISC USE TO INJECT INSULIN DAILY 100 each 3   Blood Glucose Monitoring Suppl (ACCU-CHEK GUIDE) w/Device KIT 1 Units by Does not  apply route as directed. 1 kit 0   Cholecalciferol (VITAMIN D3) 2000 units TABS Take 1 tablet by mouth at bedtime.     docusate sodium (COLACE) 100 MG capsule Take 100 mg by mouth daily as needed for mild constipation.      Dulaglutide (TRULICITY) 1.70 YF/7.4BS SOPN Inject into the skin. Inject 0.75 mg once weekly     empagliflozin (JARDIANCE) 10 MG TABS tablet Take 1 tablet (10 mg total) by mouth daily before breakfast. 90 tablet 1   escitalopram (LEXAPRO) 10 MG tablet TAKE 1 TABLET EVERY DAY 90 tablet 0   fluticasone (FLONASE) 50 MCG/ACT nasal spray SPRAY 2 SPRAYS INTO EACH NOSTRIL EVERY DAY 48 mL 0   furosemide (LASIX) 40 MG tablet Take 1 tablet (40 mg total) by mouth daily. 90 tablet 3   glucose blood (ACCU-CHEK GUIDE) test strip Check sugars three times daily and as needed when feeling ill E11.65, insulin use 100 each 11   insulin glargine (LANTUS SOLOSTAR) 100 UNIT/ML Solostar Pen Inject 100 Units into the skin every morning. And pen needles 1/day 105 mL 3   loratadine (CLARITIN) 10 MG tablet Take 10 mg by mouth daily.     losartan (COZAAR) 50 MG tablet Take 1 tablet (50 mg total) by mouth daily. 90 tablet 3   lovastatin (MEVACOR) 20 MG tablet Take 1 tablet (20 mg total) by mouth at bedtime. 90 tablet 3   Magnesium 500 MG TABS Take 1 tablet (500 mg total) by mouth every Monday, Wednesday, and Friday. 30 tablet    metFORMIN (GLUCOPHAGE-XR) 500 MG 24 hr tablet TAKE 1 TABLET BY MOUTH EVERY DAY WITH BREAKFAST 90 tablet 0   Multiple Vitamins-Minerals ([employer] SPECTRAVITE PO) Take 1 tablet by mouth at bedtime.     mycophenolate (MYFORTIC) 360 MG TBEC EC tablet Take 360 mg by mouth 2 (two) times daily.     Omega-3 Fatty Acids (FISH OIL PO) Take 1 tablet by mouth at bedtime.     omeprazole (PRILOSEC) 40 MG capsule Take 1 capsule (40 mg total) by mouth daily. 90 capsule 3   oxyCODONE (ROXICODONE) 5 MG immediate release tablet 1-2 tabs PO q6 hours prn pain 20 tablet 0   tacrolimus (PROGRAF) 1 MG capsule  Take 1-2 mg by mouth See admin instructions. Take 2 mg by mouth in the morning, then take 1 mg  by mouth in the evening     Accu-Chek FastClix Lancets MISC USE THREE TIMES DAILY TO CHECK SUGARS E65.11, INSULIN USE 102 each 12   ACCU-CHEK GUIDE test strip USE AS INSTRUCTED TO CHECK SUGARS THREE TIMES DAILY E11.65, INSULIN USE 100 strip 12   allopurinol (ZYLOPRIM) 300 MG tablet Take 1 tablet (300 mg total) by mouth daily. 90 tablet 0   No current facility-administered medications for this encounter.    ECOG PERFORMANCE STATUS:  0 - Asymptomatic  REVIEW OF SYSTEMS: Patient has above-stated comorbidities including alcoholic cirrhosis of the liver with ascites status post transplantation in 4967 chronic diastolic heart failure adult-onset diabetes chronic renal disease stage III Patient denies any weight loss, fatigue, weakness, fever, chills or night sweats. Patient denies any loss of vision, blurred vision. Patient denies any ringing  of the ears or hearing loss. No irregular heartbeat. Patient denies heart murmur or history of fainting.  Patient denies any chest pain or pain radiating to her upper extremities. Patient denies any shortness of breath, difficulty breathing at night, cough or hemoptysis. Patient denies any swelling in the lower legs. Patient denies any nausea vomiting, vomiting of blood, or coffee ground material in the vomitus. Patient denies any stomach pain. Patient states has had normal bowel movements no significant constipation or diarrhea. Patient denies any dysuria, hematuria or significant nocturia. Patient denies any problems walking, swelling in the joints or loss of balance. Patient denies any skin changes, loss of hair or loss of weight. Patient denies any excessive worrying or anxiety or significant depression. Patient denies any problems with insomnia. Patient denies excessive thirst, polyuria, polydipsia. Patient denies any swollen glands, patient denies easy bruising or easy  bleeding. Patient denies any recent infections, allergies or URI. Patient "s visual fields have not changed significantly in recent time.   PHYSICAL EXAM: BP 133/89   Pulse 79   Temp (!) 97 F (36.1 C) (Tympanic)   Wt 266 lb (120.7 kg)   BMI 42.93 kg/m  Slightly obese male in NAD.  Well-developed well-nourished patient in NAD. HEENT reveals PERLA, EOMI, discs not visualized.  Oral cavity is clear. No oral mucosal lesions are identified. Neck is clear without evidence of cervical or supraclavicular adenopathy. Lungs are clear to A&P. Cardiac examination is essentially unremarkable with regular rate and rhythm without murmur rub or thrill. Abdomen is benign with no organomegaly or masses noted. Motor sensory and DTR levels are equal and symmetric in the upper and lower extremities. Cranial nerves II through XII are grossly intact. Proprioception is intact. No peripheral adenopathy or edema is identified. No motor or sensory levels are noted. Crude visual fields are within normal range.  LABORATORY DATA: Pathology report reviewed    RADIOLOGY RESULTS: CT scan abdomen and pelvis ordered bone scan ordered   IMPRESSION: Stage IIb adenocarcinoma the prostate mostly Gleason 7 (3+4) presenting with a PSA of 71 in 55 year old male with multiple medical comorbidities  PLAN: This time agree with Dr. Erlene Quan that surgical option would not be the best interest of this patient with his multiple medical comorbidities.  We have discussed both external beam image guided IMRT radiation as well as I-125 interstitial implant.  Patient is more inclined to go with external beam image guided IMRT radiation.  I have asked Dr. Erlene Quan to place fiducial markers in his prostate for daily image guided treatment.  I would also like him to start on Eligard 28-monthdepot.  I have ordered a bone scan as well as we will review his CT scan of abdomen pelvis prior to initiating treatment.  Risks and benefits of treatment including  include increased lower urinary tract symptoms diarrhea fatigue alteration of blood counts skin reaction all were reviewed with the patient.  He seems to comprehend my recommendations well.  I would like to take this opportunity to thank you for allowing me to participate in the care of your patient..Noreene Filbert MD

## 2021-03-29 NOTE — Telephone Encounter (Signed)
Prostate cancer tends to be very slow-growing and this is probably an appropriate timeline.  No need to order images as stat.  Hollice Espy, MD

## 2021-03-29 NOTE — Telephone Encounter (Signed)
Patient informed, voiced understanding.  °

## 2021-03-30 ENCOUNTER — Telehealth: Payer: Self-pay

## 2021-03-30 NOTE — Telephone Encounter (Signed)
-----   Message from Benard Halsted sent at 03/30/2021  9:18 AM EDT ----- Regarding: PA to stop ASA prior to gold seed Can you please get this for me   thanks ----- Message ----- From: Janan Halter, CMA Sent: 03/29/2021   9:30 AM EDT To: Wynetta Fines, [place]  Patient will need eliguard and markers placed. Dr. Baruch Gouty would like the markers placed after the CT scan on 10/20. Thank you

## 2021-03-30 NOTE — Telephone Encounter (Signed)
REQUEST FOR CARDIAC CLEARANCE       Date: 03/30/2021  Request Clearance from Dr. Johney Frame, Greer Ee, MD  Faxed to: [phone]  Procedure: Girtha Rm seed placement  Date of Procedure: 04/28/2021  Provider: Dr. Erlene Quan    Cardiac  Clearance : Yes  Reason: Needs to Hold ASA for 7 days for procedure     Risk Assessment:    Low   []       Moderate   []     High   []           This patient is optimized for surgery  YES []       NO   []    I recommend further assessment/workup prior to procedure:    YES []      NO  []   Appointment scheduled for: _______________________   Further recommendations: ______________________________    Physician Signature:__________________________________   Printed Name: ________________________________________   Date: _________________

## 2021-03-31 ENCOUNTER — Telehealth: Payer: Self-pay

## 2021-03-31 ENCOUNTER — Encounter: Payer: Self-pay | Admitting: Family Medicine

## 2021-03-31 ENCOUNTER — Telehealth: Payer: Self-pay | Admitting: *Deleted

## 2021-03-31 NOTE — Telephone Encounter (Signed)
Hi Dr. Darnell Level, this is Suanne Marker, Geremy's wife. As you can see in his chart he has been diagnosed with Postrate Cancer and he has many appointments coming up with Radiation with Hormone Therapy to take place probably mid Nov once the CT/Bone Scan/Markers are placed/and Simulation takes place. We do not know if all of this has caused his nerves to act up on him but since Monday morning when he woke up his stomach has been giving him a fit. He states he has symptoms of a stomach virus. We did not know if a stomach virus was going around or if his symptoms is related to all the news to process with his treatment plan for cancer. Either way, his symptoms include bad gas, chest pain (which he is relating to the gas), diarrhea 7-8 times a day. Can you provide insight on what he can do to get this calmed down? Does he need a Telehealth appt sooner that October 18th to discuss this? If so, please contact him at [phone]. Thank you, Stonewall Doss (wife).   I spoke with pt's wife; she said since 03/29/21 pt having 7-8 episodes of watery diarrhea.pt is burping and passing a lot of gas rectally also. Suanne Marker is not sure about other symptoms so I Called and spoke with pt; Gevork said on and off he has had dull,[name]. Pt said last CP he had was last night. Pt does not have any jaw pain, and no SOB. Pt does not have abd pain, no fever, pt has couple of episodes of dizziness and dry mouth. Pt has not been eating as much since starting Trulicity last month. Pt said now he is drinking 2 - 3 sixteen oz bottles a water a day. Pt said he usually has some dehydration. Pt is not sure if has virus or if could be upset due to recent cancer. Pt does not want to go to ED and wants note sent to DR G. Pt request cb after DR G reviews note. Sending note to Dr Darnell Level and Lattie Haw CMA. Will also teams lisa.UC & ED precautions given and pt voiced understanding.

## 2021-03-31 NOTE — Telephone Encounter (Signed)
See phone note

## 2021-03-31 NOTE — Telephone Encounter (Signed)
I'm out of office tomorrow. Plz schedule virtual visit for Friday 12:30pm.  If worsening watery stools or GI symptoms, please have him evaluated in person at urgent care.  Encourage good hydration like he's doing until then.  Would suggest holding trulicity for now

## 2021-03-31 NOTE — Telephone Encounter (Signed)
Plz triage pt.  °

## 2021-03-31 NOTE — Telephone Encounter (Signed)
Benefits investigation form faxed for Eligard.  

## 2021-03-31 NOTE — Telephone Encounter (Signed)
   [place] Pre-operative Risk Assessment    Patient Name: Colton Porter  DOB: [birth year] MRN: [id number]  HEARTCARE STAFF:  - IMPORTANT!!!!!! Under Visit Info/Reason for Call, type in Other and utilize the format Clearance MM/DD/YY or Clearance TBD. Do not use dashes or single digits. - Please review there is not already an duplicate clearance open for this procedure. - If request is for dental extraction, please clarify the # of teeth to be extracted. - If the patient is currently at the dentist's office, call Pre-Op Callback Staff (MA/nurse) to input urgent request.  - If the patient is not currently in the dentist office, please route to the Pre-Op pool.  Request for surgical clearance:  What type of surgery is being performed?  GOLD SEED PLACEMENT  When is this surgery scheduled?  04/28/21  What type of clearance is required (medical clearance vs. Pharmacy clearance to hold med vs. Both)?  BOTH  Are there any medications that need to be held prior to surgery and how long?  ASPIRIN X'S 7 DAYS  Practice name and name of physician performing surgery?  [facility] / DR. Erlene Quan  What is the office phone number?  [phone]   7.   What is the office fax number?  [phone]  8.   Anesthesia type (None, local, MAC, general) ?     Jeanann Lewandowsky 03/31/2021, 6:56 AM  _________________________________________________________________   (provider comments below)

## 2021-03-31 NOTE — Telephone Encounter (Signed)
Covering preop today. Per chart review, pt's wife sent mychart message with constellation of symptoms to primary care. Will await their plan first (in case this involves going to hospital) then we will address.

## 2021-03-31 NOTE — Telephone Encounter (Signed)
Lvm asking pt to call back.  Need to relay Dr. Synthia Innocent message and schedule pt accordingly.   Also, sent Dr. Synthia Innocent message to pt via [place].

## 2021-03-31 NOTE — Telephone Encounter (Signed)
-----   Message from Janan Halter, [place] sent at 03/29/2021  9:28 AM EDT ----- Patient will need eliguard and markers placed. Dr. Baruch Gouty would like the markers placed after the CT scan on 10/20. Thank you

## 2021-04-01 ENCOUNTER — Telehealth: Payer: Self-pay | Admitting: Family Medicine

## 2021-04-01 NOTE — Telephone Encounter (Signed)
I s/w the pt and offered sooner appt 04/13/21 with Laurann Montana, NP at [facility] location. Ok per [employer], PAC to use time slot TOC 04/13/21 for pre op appt. I will forward notes to NP Owens Shark for upcoming appt. I will send FYI to requesting office pt has sooner appt.

## 2021-04-01 NOTE — Telephone Encounter (Signed)
Noted  

## 2021-04-01 NOTE — Telephone Encounter (Addendum)
   Name: Colton Porter  DOB: 07-26-1965  MRN: [id number]  Primary Cardiologist: Dr. Johney Frame  Chart reviewed as part of pre-operative protocol coverage. Because of [name]'s past medical history and time since last visit, he will require a follow-up visit in order to better assess preoperative cardiovascular risk.  Per Mychart/phone notes sent to primary care yesterday, patient has been having issues with diarrhea but also reporting chest pain amongst the symptoms (per phone note yesterday, "[name],[name]"). Primary care did advise patient be seen urgently for any chest pain and I agree with this recommendation - would recommend seeking care in ED for full evaluation. Would also recommend setting up f/u OV to evaluate from cardiac standpoint.  Pre-op covering staff: - Please schedule appointment and call patient to inform them.  - Please contact requesting surgeon's office via preferred method (i.e, phone, fax) to inform them of need for appointment prior to surgery.  Regarding holding ASA, there are no historical contraindications to holding if needed for procedure but this can be finalized at time of follow-up based on workup for chest pains.  Charlie Pitter, PA-C  04/01/2021, 8:04 AM

## 2021-04-01 NOTE — Telephone Encounter (Signed)
This access nurse note already attached to 03/31/21 phone note.

## 2021-04-01 NOTE — Telephone Encounter (Signed)
Plz add MyChart visit tomorrow, 04/02/21 at 12:30 for diarrhea.  Pt aware of appt.

## 2021-04-01 NOTE — Telephone Encounter (Signed)
[place] Night - Client Nonclinical Telephone Record AccessNurse Client [place] Night - Client Client Site [place] Physician Ria Bush - MD Contact Type Call Who Is Calling Patient / Member / Family / Caregiver Caller Name Pink Maye Caller Phone Number [phone] Call Type Message Only Information Provided Reason for Call Returning a Call from the Office Initial [place] states, returning a call from office Disp. Time Disposition Final User 03/31/2021 5:20:40 PM General Information Provided Yes Jobie Quaker Call Closed By: Jobie Quaker Transaction Date/Time: 03/31/2021 5:19:15 PM (ET)

## 2021-04-01 NOTE — Telephone Encounter (Signed)
Left message for ptcb for appt for pre op clearance. Pt will need to ask to s/w the pre op call back team, see notes from Melina Copa, [place]. Pt has been complaining of chest pain as well as diarrhea. Will need to s/w the pt.

## 2021-04-01 NOTE — Telephone Encounter (Signed)
Pt has been scheduled.  °

## 2021-04-01 NOTE — Telephone Encounter (Signed)
I s/w the pt. Confirmed with the pt that he is not having any chest pain, sob, dizziness, n&v, fever, dysuria. Pt aware that I did advise pt that if he has any more chest pain he needs to go to the ED. Pt states he is not going to the ED.  Pt states he had just a little bit of diarrhea yesterday and a bit this morning. Pt has been made appt to see Melina Copa, [facility] 04/27/21 @ 9:15. This is the day before his procedure. I did not have anything else available. I will put the pt in the wait list as well for possible sooner appt. Pt aware and is agreeable to plan of care. I will forward notes to [facility] for upcoming appt. Will send FYI to requesting office pt has appt 04/27/21.

## 2021-04-02 ENCOUNTER — Other Ambulatory Visit (INDEPENDENT_AMBULATORY_CARE_PROVIDER_SITE_OTHER): Payer: Medicare HMO

## 2021-04-02 ENCOUNTER — Other Ambulatory Visit: Payer: Self-pay

## 2021-04-02 ENCOUNTER — Encounter: Payer: Self-pay | Admitting: Family Medicine

## 2021-04-02 ENCOUNTER — Other Ambulatory Visit: Payer: Self-pay | Admitting: Family Medicine

## 2021-04-02 ENCOUNTER — Telehealth (INDEPENDENT_AMBULATORY_CARE_PROVIDER_SITE_OTHER): Payer: Medicare HMO | Admitting: Family Medicine

## 2021-04-02 VITALS — Ht 65.0 in | Wt 257.0 lb

## 2021-04-02 DIAGNOSIS — D849 Immunodeficiency, unspecified: Secondary | ICD-10-CM

## 2021-04-02 DIAGNOSIS — R197 Diarrhea, unspecified: Secondary | ICD-10-CM

## 2021-04-02 DIAGNOSIS — N1831 Chronic kidney disease, stage 3a: Secondary | ICD-10-CM | POA: Diagnosis not present

## 2021-04-02 DIAGNOSIS — C61 Malignant neoplasm of prostate: Secondary | ICD-10-CM | POA: Diagnosis not present

## 2021-04-02 DIAGNOSIS — Z944 Liver transplant status: Secondary | ICD-10-CM

## 2021-04-02 DIAGNOSIS — Z794 Long term (current) use of insulin: Secondary | ICD-10-CM

## 2021-04-02 DIAGNOSIS — E1169 Type 2 diabetes mellitus with other specified complication: Secondary | ICD-10-CM

## 2021-04-02 LAB — COMPREHENSIVE METABOLIC PANEL
AG Ratio: 1.4 (calc) (ref 1.0–2.5)
ALT: 21 U/L (ref 9–46)
AST: 16 U/L (ref 10–35)
Albumin: 4.3 g/dL (ref 3.6–5.1)
Alkaline phosphatase (APISO): 91 U/L (ref 35–144)
BUN/Creatinine Ratio: 11 (calc) (ref 6–22)
BUN: 14 mg/dL (ref 7–25)
CO2: 26 mmol/L (ref 20–32)
Calcium: 9.2 mg/dL (ref 8.6–10.3)
Chloride: 104 mmol/L (ref 98–110)
Creat: 1.31 mg/dL — ABNORMAL HIGH (ref 0.70–1.30)
Globulin: 3.1 g/dL (calc) (ref 1.9–3.7)
Glucose, Bld: 138 mg/dL — ABNORMAL HIGH (ref 65–99)
Potassium: 4.1 mmol/L (ref 3.5–5.3)
Sodium: 140 mmol/L (ref 135–146)
Total Bilirubin: 0.6 mg/dL (ref 0.2–1.2)
Total Protein: 7.4 g/dL (ref 6.1–8.1)

## 2021-04-02 LAB — CBC WITH DIFFERENTIAL/PLATELET
Absolute Monocytes: 529 cells/uL (ref 200–950)
Basophils Absolute: 47 cells/uL (ref 0–200)
Basophils Relative: 0.6 %
Eosinophils Absolute: 158 cells/uL (ref 15–500)
Eosinophils Relative: 2 %
HCT: 42.2 % (ref 38.5–50.0)
Hemoglobin: 13.8 g/dL (ref 13.2–17.1)
Lymphs Abs: 1738 cells/uL (ref 850–3900)
MCH: 27.8 pg (ref 27.0–33.0)
MCHC: 32.7 g/dL (ref 32.0–36.0)
MCV: 85.1 fL (ref 80.0–100.0)
MPV: 12.6 fL — ABNORMAL HIGH (ref 7.5–12.5)
Monocytes Relative: 6.7 %
Neutro Abs: 5427 cells/uL (ref 1500–7800)
Neutrophils Relative %: 68.7 %
Platelets: 182 10*3/uL (ref 140–400)
RBC: 4.96 10*6/uL (ref 4.20–5.80)
RDW: 14.7 % (ref 11.0–15.0)
Total Lymphocyte: 22 %
WBC: 7.9 10*3/uL (ref 3.8–10.8)

## 2021-04-02 NOTE — Telephone Encounter (Signed)
Incoming benefit investigation form, no PA required for Eligard/Lupron.

## 2021-04-02 NOTE — Progress Notes (Signed)
Per Dr. Bosie Clos instructions, patient is here for labs. Blood was drawn right median cubital vein. Patient given stool specimen collection kit and verbal instructions. Pt voiced understanding and tolerated blood draw well. Sw, cma

## 2021-04-02 NOTE — Progress Notes (Signed)
Patient ID: Colton Porter, male    DOB: Dec 13, 1965, 55 y.o.   MRN: [id number]  Virtual visit completed through MyChart, a video enabled telemedicine application. Due to national recommendations of social distancing due to COVID-19, a virtual visit is felt to be most appropriate for this patient at this time. Reviewed limitations, risks, security and privacy concerns of performing a virtual visit and the availability of in person appointments. I also reviewed that there may be a patient responsible charge related to this service. The patient agreed to proceed.   Patient location: home Provider location: [place] at [facility], office Persons participating in this virtual visit: patient, provider   If any vitals were documented, they were collected by patient at home unless specified below.    Ht 5\' 5"  (1.651 m)   Wt 257 lb (116.6 kg)   BMI 42.77 kg/m    CC: diarrhea Subjective:   HPI: Colton Porter is a 55 y.o. male presenting on 04/02/2021 for Diarrhea (C/o diarrhea.  Started 03/29/21.  Denies nausea/vomiting. )   Known type 2 diabetic, chronic HFpEF, CKD stage 3 s/p liver transplant for alcoholic cirrhosis on myfortic and prograf.  Recently diagnosed prostate cancer planning to start brachytherapy 04/2021.   4d h/o loose to watery diarrhea 5-7 times per day associated with sour stomach. Very gassy. Otherwise feels well.   No fevers/chills, abd pain, blood in stool.  No respiratory symptoms, headache, dizziness.  No further episodes of chest discomfort or dyspnea.   Hasn't tried anything for this yet.  Feels staying well hydrated with fanta, sweet tea and water.  No new medicines or vitamins or supplements. No recent travel.      Relevant past medical, surgical, family and social history reviewed and updated as indicated. Interim medical history since our last visit reviewed. Allergies and medications reviewed and updated. Outpatient Medications Prior to Visit   Medication Sig Dispense Refill   Accu-Chek FastClix Lancets MISC USE THREE TIMES DAILY TO CHECK SUGARS E65.11, INSULIN USE 102 each 12   ACCU-CHEK GUIDE test strip USE AS INSTRUCTED TO CHECK SUGARS THREE TIMES DAILY E11.65, INSULIN USE 100 strip 12   acetaminophen (TYLENOL) 500 MG tablet Take 500 mg by mouth every 6 (six) hours as needed for mild pain.     allopurinol (ZYLOPRIM) 300 MG tablet Take 1 tablet (300 mg total) by mouth daily. 90 tablet 0   ASPIR-LOW 81 MG EC tablet Take 1 tablet (81 mg total) by mouth daily. 90 tablet 3   B-D UF III MINI PEN NEEDLES 31G X 5 MM MISC USE TO INJECT INSULIN DAILY 100 each 3   Blood Glucose Monitoring Suppl (ACCU-CHEK GUIDE) w/Device KIT 1 Units by Does not apply route as directed. 1 kit 0   Cholecalciferol (VITAMIN D3) 2000 units TABS Take 1 tablet by mouth at bedtime.     docusate sodium (COLACE) 100 MG capsule Take 100 mg by mouth daily as needed for mild constipation.      Dulaglutide (TRULICITY) 0.75 MG/0.5ML SOPN Inject into the skin. Inject 0.75 mg once weekly     empagliflozin (JARDIANCE) 10 MG TABS tablet Take 1 tablet (10 mg total) by mouth daily before breakfast. 90 tablet 1   escitalopram (LEXAPRO) 10 MG tablet TAKE 1 TABLET EVERY DAY 90 tablet 0   fluticasone (FLONASE) 50 MCG/ACT nasal spray SPRAY 2 SPRAYS INTO EACH NOSTRIL EVERY DAY 48 mL 0   furosemide (LASIX) 40 MG tablet Take 1 tablet (40 mg  total) by mouth daily. 90 tablet 3   glucose blood (ACCU-CHEK GUIDE) test strip Check sugars three times daily and as needed when feeling ill E11.65, insulin use 100 each 11   insulin glargine (LANTUS SOLOSTAR) 100 UNIT/ML Solostar Pen Inject 100 Units into the skin every morning. And pen needles 1/day 105 mL 3   loratadine (CLARITIN) 10 MG tablet Take 10 mg by mouth daily.     losartan (COZAAR) 50 MG tablet Take 1 tablet (50 mg total) by mouth daily. 90 tablet 3   lovastatin (MEVACOR) 20 MG tablet Take 1 tablet (20 mg total) by mouth at bedtime. 90  tablet 3   Magnesium 500 MG TABS Take 1 tablet (500 mg total) by mouth every Monday, Wednesday, and Friday. 30 tablet    metFORMIN (GLUCOPHAGE-XR) 500 MG 24 hr tablet TAKE 1 TABLET BY MOUTH EVERY DAY WITH BREAKFAST 90 tablet 3   Multiple Vitamins-Minerals ([employer] SPECTRAVITE PO) Take 1 tablet by mouth at bedtime.     mycophenolate (MYFORTIC) 360 MG TBEC EC tablet Take 360 mg by mouth 2 (two) times daily.     Omega-3 Fatty Acids (FISH OIL PO) Take 1 tablet by mouth at bedtime.     omeprazole (PRILOSEC) 40 MG capsule Take 1 capsule (40 mg total) by mouth daily. 90 capsule 3   oxyCODONE (ROXICODONE) 5 MG immediate release tablet 1-2 tabs PO q6 hours prn pain 20 tablet 0   tacrolimus (PROGRAF) 1 MG capsule Take 1-2 mg by mouth See admin instructions. Take 2 mg by mouth in the morning, then take 1 mg  by mouth in the evening     No facility-administered medications prior to visit.     Per HPI unless specifically indicated in ROS section below Review of Systems Objective:  Ht 5\' 5"  (1.651 m)   Wt 257 lb (116.6 kg)   BMI 42.77 kg/m   Wt Readings from Last 3 Encounters:  04/02/21 257 lb (116.6 kg)  03/29/21 266 lb (120.7 kg)  03/23/21 261 lb (118.4 kg)       Physical exam: Gen: alert, NAD, not ill appearing Pulm: speaks in complete sentences without increased work of breathing Psych: normal mood, normal thought content      Results for orders placed or performed in visit on 03/22/21  POCT glycosylated hemoglobin (Hb A1C)  Result Value Ref Range   Hemoglobin A1C 8.6 (A) 4.0 - 5.6 %   HbA1c POC (<> result, manual entry)     HbA1c, POC (prediabetic range)     HbA1c, POC (controlled diabetic range)     Assessment & Plan:   Problem List Items Addressed This Visit     Type 2 diabetes mellitus with other specified complication (HCC) (Chronic)   Status post liver transplant (HCC) (Chronic)   CKD (chronic kidney disease) stage 3, GFR 30-59 ml/min (HCC)   Prostate cancer (HCC)    Immunosuppressed status (HCC)   Diarrhea of presumed infectious origin - Primary    5d h/o diarrheal illness with loose to watery stools x5-7/day. Given comorbidities, did recommend he submit GI pathogen panel to r/o infectious cause. He will come in this afternoon to collect stool testing supplies. Will add labwork as well.  In interim, recommend continue good hydration status.       Relevant Orders   Gastrointestinal Pathogen Panel PCR   Comprehensive metabolic panel   CBC with Differential/Platelet     No orders of the defined types were placed in this encounter.  Orders Placed This Encounter  Procedures   Gastrointestinal Pathogen Panel PCR    Standing Status:   Future    Standing Expiration Date:   04/02/2022   Comprehensive metabolic panel    Standing Status:   Future    Standing Expiration Date:   04/02/2022   CBC with Differential/Platelet    Standing Status:   Future    Standing Expiration Date:   04/02/2022    I discussed the assessment and treatment plan with the patient. The patient was provided an opportunity to ask questions and all were answered. The patient agreed with the plan and demonstrated an understanding of the instructions. The patient was advised to call back or seek an in-person evaluation if the symptoms worsen or if the condition fails to improve as anticipated.  As of September 9th 2022, we will be based out of the [place] office at [facility] for several months while our [employer] undergo renovations.  [place] [facility] [employer] address:  [place], [place] [address]  You can call us at [phone] with any questions.   Follow up plan: No follow-ups on file.  Ria Bush, MD

## 2021-04-02 NOTE — Assessment & Plan Note (Signed)
5d h/o diarrheal illness with loose to watery stools x5-7/day. Given comorbidities, did recommend he submit GI pathogen panel to r/o infectious cause. He will come in this afternoon to collect stool testing supplies. Will add labwork as well.  In interim, recommend continue good hydration status.

## 2021-04-02 NOTE — Addendum Note (Signed)
Addended by: Tammi Sou on: 04/02/2021 04:43 PM   Modules accepted: Orders

## 2021-04-05 DIAGNOSIS — R197 Diarrhea, unspecified: Secondary | ICD-10-CM | POA: Diagnosis not present

## 2021-04-05 DIAGNOSIS — D849 Immunodeficiency, unspecified: Secondary | ICD-10-CM | POA: Diagnosis not present

## 2021-04-05 DIAGNOSIS — A09 Infectious gastroenteritis and colitis, unspecified: Secondary | ICD-10-CM | POA: Diagnosis not present

## 2021-04-05 NOTE — Addendum Note (Signed)
Addended by: Tammi Sou on: 04/05/2021 03:44 PM   Modules accepted: Orders

## 2021-04-06 ENCOUNTER — Telehealth: Payer: Self-pay

## 2021-04-06 LAB — GASTROINTESTINAL PATHOGEN PANEL PCR
C. difficile Tox A/B, PCR: NOT DETECTED
Campylobacter, PCR: NOT DETECTED
Cryptosporidium, PCR: NOT DETECTED
E coli (ETEC) LT/ST PCR: NOT DETECTED
E coli (STEC) stx1/stx2, PCR: NOT DETECTED
E coli 0157, PCR: NOT DETECTED
Giardia lamblia, PCR: NOT DETECTED
Norovirus, PCR: NOT DETECTED
Rotavirus A, PCR: NOT DETECTED
Salmonella, PCR: NOT DETECTED
Shigella, PCR: NOT DETECTED

## 2021-04-06 NOTE — Chronic Care Management (AMB) (Addendum)
Chronic Care Management Pharmacy Assistant   Name: Colton Porter  MRN: [id number] DOB: [birth year]   Reason for Encounter: Reminder Call   Conditions to be addressed/monitored: HTN, DMII, and CKD Stage 3   Medications: Outpatient Encounter Medications as of 04/06/2021  Medication Sig   Accu-Chek FastClix Lancets MISC USE THREE TIMES DAILY TO CHECK SUGARS E65.11, INSULIN USE   ACCU-CHEK GUIDE test strip USE AS INSTRUCTED TO CHECK SUGARS THREE TIMES DAILY E11.65, INSULIN USE   acetaminophen (TYLENOL) 500 MG tablet Take 500 mg by mouth every 6 (six) hours as needed for mild pain.   allopurinol (ZYLOPRIM) 300 MG tablet Take 1 tablet (300 mg total) by mouth daily.   ASPIR-LOW 81 MG EC tablet Take 1 tablet (81 mg total) by mouth daily.   B-D UF III MINI PEN NEEDLES 31G X 5 MM MISC USE TO INJECT INSULIN DAILY   Blood Glucose Monitoring Suppl (ACCU-CHEK GUIDE) w/Device KIT 1 Units by Does not apply route as directed.   Cholecalciferol (VITAMIN D3) 2000 units TABS Take 1 tablet by mouth at bedtime.   docusate sodium (COLACE) 100 MG capsule Take 100 mg by mouth daily as needed for mild constipation.    Dulaglutide (TRULICITY) 7.04 UG/8.9VQ SOPN Inject into the skin. Inject 0.75 mg once weekly   empagliflozin (JARDIANCE) 10 MG TABS tablet Take 1 tablet (10 mg total) by mouth daily before breakfast.   escitalopram (LEXAPRO) 10 MG tablet TAKE 1 TABLET EVERY DAY   fluticasone (FLONASE) 50 MCG/ACT nasal spray SPRAY 2 SPRAYS INTO EACH NOSTRIL EVERY DAY   furosemide (LASIX) 40 MG tablet Take 1 tablet (40 mg total) by mouth daily.   glucose blood (ACCU-CHEK GUIDE) test strip Check sugars three times daily and as needed when feeling ill E11.65, insulin use   insulin glargine (LANTUS SOLOSTAR) 100 UNIT/ML Solostar Pen Inject 100 Units into the skin every morning. And pen needles 1/day   loratadine (CLARITIN) 10 MG tablet Take 10 mg by mouth daily.   losartan (COZAAR) 50 MG tablet Take 1 tablet (50  mg total) by mouth daily.   lovastatin (MEVACOR) 20 MG tablet Take 1 tablet (20 mg total) by mouth at bedtime.   Magnesium 500 MG TABS Take 1 tablet (500 mg total) by mouth every Monday, Wednesday, and Friday.   metFORMIN (GLUCOPHAGE-XR) 500 MG 24 hr tablet TAKE 1 TABLET BY MOUTH EVERY DAY WITH BREAKFAST   Multiple Vitamins-Minerals (CVS SPECTRAVITE PO) Take 1 tablet by mouth at bedtime.   mycophenolate (MYFORTIC) 360 MG TBEC EC tablet Take 360 mg by mouth 2 (two) times daily.   Omega-3 Fatty Acids (FISH OIL PO) Take 1 tablet by mouth at bedtime.   omeprazole (PRILOSEC) 40 MG capsule Take 1 capsule (40 mg total) by mouth daily.   oxyCODONE (ROXICODONE) 5 MG immediate release tablet 1-2 tabs PO q6 hours prn pain   tacrolimus (PROGRAF) 1 MG capsule Take 1-2 mg by mouth See admin instructions. Take 2 mg by mouth in the morning, then take 1 mg  by mouth in the evening   No facility-administered encounter medications on file as of 04/06/2021.   [name] was contacted to remind him of his upcoming telephone visit with Debbora Dus on 04/13/21 at 9:30am. Patient was reminded to have all medications, supplements and any blood glucose and blood pressure readings available for review at appointment.  Star Rating Drugs: Medication:   Last Fill: Day Supply Metformin XR 596m 01/19/21 90 Losartan 564m  12/10/20 90 Lovastatin 92NG  3/94/32 90 Trulicity   0/0/37  28    PAP Jardiance   12/03/20  90    PAP Lantus    02/12/21 90    PAP    Debbora Dus, CPP notified  Avel Sensor, [place] Assistant [phone]  I have reviewed the care management and care coordination activities outlined in this encounter and I am certifying that I agree with the content of this note. No further action required.  Debbora Dus, PharmD Clinical Pharmacist [facility] at [facility] [phone]

## 2021-04-13 ENCOUNTER — Other Ambulatory Visit: Payer: Self-pay

## 2021-04-13 ENCOUNTER — Ambulatory Visit (INDEPENDENT_AMBULATORY_CARE_PROVIDER_SITE_OTHER): Payer: Medicare HMO

## 2021-04-13 ENCOUNTER — Ambulatory Visit (HOSPITAL_BASED_OUTPATIENT_CLINIC_OR_DEPARTMENT_OTHER): Payer: Medicare HMO | Admitting: Family

## 2021-04-13 ENCOUNTER — Encounter (HOSPITAL_BASED_OUTPATIENT_CLINIC_OR_DEPARTMENT_OTHER): Payer: Self-pay | Admitting: Family

## 2021-04-13 VITALS — BP 128/88 | HR 84 | Ht 66.5 in | Wt 258.0 lb

## 2021-04-13 DIAGNOSIS — Z794 Long term (current) use of insulin: Secondary | ICD-10-CM | POA: Diagnosis not present

## 2021-04-13 DIAGNOSIS — E1169 Type 2 diabetes mellitus with other specified complication: Secondary | ICD-10-CM

## 2021-04-13 DIAGNOSIS — I5032 Chronic diastolic (congestive) heart failure: Secondary | ICD-10-CM

## 2021-04-13 DIAGNOSIS — Z944 Liver transplant status: Secondary | ICD-10-CM | POA: Diagnosis not present

## 2021-04-13 DIAGNOSIS — G4733 Obstructive sleep apnea (adult) (pediatric): Secondary | ICD-10-CM | POA: Diagnosis not present

## 2021-04-13 DIAGNOSIS — Z01810 Encounter for preprocedural cardiovascular examination: Secondary | ICD-10-CM | POA: Diagnosis not present

## 2021-04-13 DIAGNOSIS — I700 Atherosclerosis of aorta: Secondary | ICD-10-CM | POA: Diagnosis not present

## 2021-04-13 DIAGNOSIS — E785 Hyperlipidemia, unspecified: Secondary | ICD-10-CM

## 2021-04-13 DIAGNOSIS — I10 Essential (primary) hypertension: Secondary | ICD-10-CM | POA: Diagnosis not present

## 2021-04-13 NOTE — Progress Notes (Signed)
Office Visit    Patient Name: Colton Porter Date of Encounter: 04/13/2021  PCP:  Ria Bush, [facility]  Cardiologist:  Freada Bergeron, MD  Advanced Practice Provider:  No care team member to display Electrophysiologist:  None     Chief Complaint    Colton Porter is a 55 y.o. male with a hx of hyperlipidemia, obesity, aortic atherosclerosis, OSA on CPAP, hypertension, chronic diastolic dysfunction, chronic thrombocytopenia with platelet count of 50, significant EtOH history with subsequent alcoholic cirrhosis and liver transplant July 2017 presents today for preoperative clearance  Past Medical History    Past Medical History:  Diagnosis Date   Alcohol dependence ([place]) 4/62/7035   Alcoholic cirrhosis of liver with ascites ([place]) 07/2014   s./p transplant 12/2015   Allergy    Anemia    Cellulitis of left leg    Chronic diastolic heart failure ([place]) 11/01/2013   CKD (chronic kidney disease) stage 3, GFR 30-59 ml/min ([place]) 11/04/2015   Diabetes mellitus without complication ([place])    Type II   GERD (gastroesophageal reflux disease)    Hyperlipidemia    Hypertension    Neuropathy    OSA on CPAP 07/11/2012   HST 07/2013:  AHI 39/hr.     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  Irrigation debridement of right thumb open distal phalanx fracture 2.  Repair of right thumb skin and nailbed laceration ;  Surgeon: Leanora Cover, MD;  Location: [facility];  Service: Orthopedics;  Laterality: Right;   LEFT AND RIGHT HEART CATHETERIZATION WITH CORONARY ANGIOGRAM N/A 06/10/2013   Procedure: LEFT AND  RIGHT HEART CATHETERIZATION WITH CORONARY ANGIOGRAM;  Surgeon: Blane Ohara, MD;  Location: [facility] CATH LAB;  Service: Cardiovascular;  Laterality: N/A;   LIVER TRANSPLANTATION  00/9381   alcoholic cirrhosis ([facility] at [facility])    Allergies  Allergies  Allergen Reactions   Tolmetin Rash    cirrhosis   Ambien [Zolpidem Tartrate] Other (See Comments)    Over-toxicity from liver failure   Morphine And Related     Hallucinations.   Hydrochlorothiazide W-Triamterene Other (See Comments)    REACTION: dizzy, nausea   Lisinopril Other (See Comments)    REACTION: cough, decreased libido    History of Present Illness    Colton Porter is a 55 y.o. male with a hx of hyperlipidemia, aortic atherosclerosis, obesity, OSA on CPAP, hypertension, chronic diastolic dysfunction, chronic thrombocytopenia with platelet count of 50, significant EtOH history with subsequent alcoholic cirrhosis and liver transplant July 2017 last seen 11/24/2020 by Dr. Johney Frame.  Previous patient of Dr. Meda Coffee who is since established with Dr. Johney Frame.  He had prior left and right heart cardiac catheterization December 2014 with normal coronaries and elevated right-sided pressures in the setting of diastolic dysfunction.  Transthoracic echocardiogram 05/2016 normal LVEF 55 to 60% with grade 2 diastolic dysfunction, no R WMA.  Myoview 2018 with normal LVEF no ischemia or prior infarct.  TTE 07/31/2019 LVEF 65 to 82%, grade 1 diastolic dysfunction, no significant valvular disease.  A severe COVID 07/17/2019 - 08/17/2019 with course complicated  by pneumonia and seizures.  Was last seen in clinic 11/24/2020 doing overall from cardiac perspective.  Reported no shortness of breath nor chest pain.  He noted good control of his swelling with compression stockings.  He was working on diet and eating fewer sweets.  No changes were made at that time he was recommended to follow-up in 6 months.  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 He has upcoming gold seed placement scheduled 04/28/2021 with Dr. Erlene Quan of [facility]. He presenst today for preoperative clearance.   Presents today for follow up. Very pleasant gentleman who enjoys woodworking in his spare time. Notes diarrhea is improving. He had stopped his Trulicity at the direction of Dr. [name]. He thinks his nerves are on edge since his cancer diagnosis which is upsetting his stomach. He reports occasional chest pain which he attributes to gas. It is in various spots on his chest wall to under his left arm, occurs at rest, and resolves with belching or passing gas. He reports no shortness of breath, lightheadedness, near syncope, syncope, orthopnea, PND. His edema is well controlled with compression socks.   EKGs/Labs/Other Studies Reviewed:   The following studies were reviewed today:  TTE 07/2019: IMPRESSIONS   1. Left ventricular ejection fraction, by visual estimation, is 65 to  70%. The left ventricle has hyperdynamic function. There is moderately  increased left ventricular hypertrophy.   2. Left ventricular diastolic parameters are consistent with Grade I  diastolic dysfunction (impaired relaxation).   3. The left ventricle has no regional wall motion abnormalities.   4. Global right ventricle has normal systolic function.The right  ventricular size is normal. Mildly increased right ventricular wall  thickness.   5. Left atrial size was normal.   6. Right atrial size was normal.   7. The mitral valve is grossly normal. No evidence of mitral valve  regurgitation.   8. The tricuspid valve is grossly normal.   9. The tricuspid valve is grossly normal. Tricuspid valve regurgitation  is trivial.  10. The aortic valve is grossly normal. Aortic valve regurgitation is not  visualized.  11. The pulmonic valve was grossly normal. Pulmonic valve regurgitation is  not visualized.  12. The interatrial  septum was not well visualized.  13. Very poor acoustic windows due to patient's body habitus. LV and RV  function are grossly normal. No obvious valvular abnormalities noted.    Myoview 2018:   Nuclear stress EF: 64%. Normal wall motion There was no ST segment deviation noted during stress. No rest images obtained. Stress images do not show any perfusion defects. Based upon stress imaging, stress test is low risk no perfusion defects, normal ejection fraction    EKG:  EKG is  ordered today.  The ekg ordered today demonstrates NSR 84 bpm with no acute St/T wave changes.   Recent Labs: 02/08/2021: Magnesium 1.7 04/02/2021: ALT 21; BUN 14; Creat 1.31; Hemoglobin 13.8; Platelets 182; Potassium 4.1; Sodium 140  Recent Lipid Panel    Component Value Date/Time   CHOL 98 02/08/2021 0802   TRIG (H) 02/08/2021 0802    409.0 Triglyceride is over 400; calculations on Lipids are invalid.   HDL 26.50 (L) 02/08/2021 0802   CHOLHDL 4 02/08/2021 0802   VLDL 59.6 (H) 10/15/2018 0929   LDLCALC 44 10/06/2017 1017   LDLDIRECT 38.0 02/08/2021 0802   Home Medications   Current Meds  Medication Sig   Accu-Chek FastClix Lancets MISC  USE THREE TIMES DAILY TO CHECK SUGARS E65.11, INSULIN USE   ACCU-CHEK GUIDE test strip USE AS INSTRUCTED TO CHECK SUGARS THREE TIMES DAILY E11.65, INSULIN USE   acetaminophen (TYLENOL) 500 MG tablet Take 500 mg by mouth every 6 (six) hours as needed for mild pain.   allopurinol (ZYLOPRIM) 300 MG tablet Take 1 tablet (300 mg total) by mouth daily.   ASPIR-LOW 81 MG EC tablet Take 1 tablet (81 mg total) by mouth daily.   B-D UF III MINI PEN NEEDLES 31G X 5 MM MISC USE TO INJECT INSULIN DAILY   Blood Glucose Monitoring Suppl (ACCU-CHEK GUIDE) w/Device KIT 1 Units by Does not apply route as directed.   Cholecalciferol (VITAMIN D3) 2000 units TABS Take 1 tablet by mouth at bedtime.   docusate sodium (COLACE) 100 MG capsule Take 100 mg by mouth daily as needed for mild  constipation.    empagliflozin (JARDIANCE) 10 MG TABS tablet Take 1 tablet (10 mg total) by mouth daily before breakfast.   escitalopram (LEXAPRO) 10 MG tablet TAKE 1 TABLET EVERY DAY   fluticasone (FLONASE) 50 MCG/ACT nasal spray SPRAY 2 SPRAYS INTO EACH NOSTRIL EVERY DAY   furosemide (LASIX) 40 MG tablet Take 1 tablet (40 mg total) by mouth daily.   glucose blood (ACCU-CHEK GUIDE) test strip Check sugars three times daily and as needed when feeling ill E11.65, insulin use   insulin glargine (LANTUS SOLOSTAR) 100 UNIT/ML Solostar Pen Inject 100 Units into the skin every morning. And pen needles 1/day   loratadine (CLARITIN) 10 MG tablet Take 10 mg by mouth daily.   losartan (COZAAR) 50 MG tablet Take 1 tablet (50 mg total) by mouth daily.   lovastatin (MEVACOR) 20 MG tablet Take 1 tablet (20 mg total) by mouth at bedtime.   Magnesium 500 MG TABS Take 1 tablet (500 mg total) by mouth every Monday, Wednesday, and Friday.   metFORMIN (GLUCOPHAGE-XR) 500 MG 24 hr tablet TAKE 1 TABLET BY MOUTH EVERY DAY WITH BREAKFAST   Multiple Vitamins-Minerals ([employer] SPECTRAVITE PO) Take 1 tablet by mouth at bedtime.   mycophenolate (MYFORTIC) 360 MG TBEC EC tablet Take 360 mg by mouth 2 (two) times daily.   Omega-3 Fatty Acids (FISH OIL PO) Take 1 tablet by mouth at bedtime.   omeprazole (PRILOSEC) 40 MG capsule Take 1 capsule (40 mg total) by mouth daily.   oxyCODONE (ROXICODONE) 5 MG immediate release tablet 1-2 tabs PO q6 hours prn pain   tacrolimus (PROGRAF) 1 MG capsule Take 1-2 mg by mouth See admin instructions. Take 2 mg by mouth in the morning, then take 1 mg  by mouth in the evening     Review of Systems      All other systems reviewed and are otherwise negative except as noted above.  Physical Exam    VS:  BP 128/88   Pulse 84   Ht 5' 6.5" (1.689 m)   Wt 258 lb (117 kg)   SpO2 97%   BMI 41.02 kg/m  , BMI Body mass index is 41.02 kg/m.  Wt Readings from Last 3 Encounters:  04/13/21 258 lb  (117 kg)  04/02/21 257 lb (116.6 kg)  03/29/21 266 lb (120.7 kg)     GEN: Well nourished, overweight, well developed, in no acute distress. HEENT: normal. Neck: Supple, no JVD, carotid bruits, or masses. Cardiac: RRR, no murmurs, rubs, or gallops. No clubbing, cyanosis, edema.  Radials/PT 2+ and equal bilaterally.  Respiratory:  Respirations regular and unlabored,  clear to auscultation bilaterally. GI: Soft, nontender, nondistended. MS: No deformity or atrophy. Skin: Warm and dry, no rash. Neuro:  Strength and sensation are intact. Psych: Normal affect.  Assessment & Plan    Preoperative cardiovascular clearance - Upcoming seed implant for prostate cancer. According to the Revised Cardiac Risk Index (RCRI), his Perioperative Risk of Major Cardiac Event is (%): 6.6. His  Functional Capacity in METs is: 6.61 according to the Duke Activity Status Index (DASI). He is deemed acceptable risk for the planned procedure without additional cardiovascular testing. He may hold Aspirin 7 days prior to procedure. Will route to requesting party so they are aware.   Atypical chest pain - Reports chest pain predominantly under left arm that resolves with gas or belching. Likely etiology indigestion. Chest pain is nonexertional, EKG with no acute ST/T wave changes. No indication for ischemic evaluation.   Diastolic heart failure - Grossly euvolemic on exam. Continue compression socks as they are controlling edema. Continue GDMT includes Lasix, Jardiance.   Hypertension - BP well controlled. Continue current antihypertensive regimen.    Alcoholic cirrhosis s/p liver transplant 2017 -follows with a specialist in [place].  DM2 on insulin - 03/22/21 A1c 8.6. Not at goal. Follows with endocrinology and PCP.   Obesity - Weight loss via diet and exercise encouraged. Discussed the impact being overweight would have on cardiovascular risk.   OSA - Wears CPAP regularly. CPAP compliance encouraged.    Hyperlipidemia - 02/08/21 total cholesterol 98, HDL 26.5, LDL 38, triglycerides 409.  Increase controlled diabetes will lead to improved triglyceride control.  Continue lovastatin 20 mg daily.  Future considerations include the addition of Vascepa.  Aortic atherosclerosis - Noted by CT abd 06/2019. No anginal symptoms. Heart healthy diet and regular cardiovascular exercise encouraged.  Continue statin.   Disposition: Follow up in 6 month(s) with Dr. Johney Frame or APP.  Signed, Loel Dubonnet, NP 04/13/2021, 8:40 AM [place]

## 2021-04-13 NOTE — Progress Notes (Signed)
Chronic Care Management Pharmacy Note  04/13/2021 Name:  Colton Porter MRN:  [id number] DOB:  [birth year]  Summary: He is holding Trulicity due to recent diarrhea. Reports discussed with PCP. His last dose was Sunday 04/04/21. Diarrhea has improved some, still belching and gas. He has not been eating much the past 2 weeks due to upset stomach. Not checking BG currently. We discussed importance of maintaining BG control. BP controlled in clinic.  Recommendations/Changes made from today's visit: Recommend monitoring home BG several days per week with fasting goal (80-130) and post-meal goal (less than 180). Recommend retrial Trulicity low dose (7.09 mg) in 4 weeks if diarrhea resolved.  Plan: Follow up 30 days    Subjective: Colton Porter is an 55 y.o. year old male who is a primary patient of Ria Bush, MD.  The CCM team was consulted for assistance with disease management and care coordination needs.    Engaged with patient by telephone for follow up visit in response to provider referral for pharmacy case management and/or care coordination services.   Consent to Services:  The patient was given information about Chronic Care Management services, agreed to services, and gave verbal consent prior to initiation of services.  Please see initial visit note for detailed documentation.   Patient Care Team: Ria Bush, MD as PCP - General (Family Medicine) Freada Bergeron, MD as PCP - Cardiology (Cardiology) Debbora Dus, [facility] as Pharmacist (Pharmacist)  Recent office visits: 04/02/21 - PCP - Video visit, diarrhea - Stool kit normal, stay hydrated, return if symptoms worsen. 02/15/21 - PCP - Patient presented for AWV. Labs ordered (new A1c 11.5). Referral to urology. Follow up 4 months. 01/12/21 - PCP - Video visit for headache, ear pain, acute sinusitis. Start short course of doxycycline 172m take 1 tablet 2 times daily. Recommend home covid test.   Recent  consult visits:  03/23/21 - Urology - Prostate cancer follow up, referred to radiation oncology. Start Eligard.  03/22/21 - Endocrinology - A1c improved, 8.6%. Follow up with PCP.   Hospital visits:  None in previous 6 months  Objective:  Lab Results  Component Value Date   CREATININE 1.31 (H) 04/02/2021   BUN 14 04/02/2021   GFR 68.61 02/08/2021   GFRNONAA >60 08/02/2019   GFRAA >60 08/02/2019   NA 140 04/02/2021   K 4.1 04/02/2021   CALCIUM 9.2 04/02/2021   CO2 26 04/02/2021   GLUCOSE 138 (H) 04/02/2021   Lab Results  Component Value Date/Time   HGBA1C 8.6 (A) 03/22/2021 08:40 AM   HGBA1C 11.5 (H) 02/08/2021 08:02 AM   HGBA1C 9.2 (A) 09/28/2020 08:45 AM   HGBA1C 10.6 (H) 01/22/2020 07:28 AM   FRUCTOSAMINE 436 (H) 04/22/2020 08:35 AM   FRUCTOSAMINE 232 08/02/2017 08:26 AM   GFR 68.61 02/08/2021 08:02 AM   GFR 70.48 04/07/2020 08:39 AM   MICROALBUR <0.7 08/27/2014 09:51 AM    Last diabetic Eye exam:  Lab Results  Component Value Date/Time   HMDIABEYEEXA No Retinopathy 03/20/2020 12:00 AM     Lab Results  Component Value Date   CHOL 98 02/08/2021   HDL 26.50 (L) 02/08/2021   LDLCALC 44 10/06/2017   LDLDIRECT 38.0 02/08/2021   TRIG (H) 02/08/2021    409.0 Triglyceride is over 400; calculations on Lipids are invalid.   CHOLHDL 4 02/08/2021    Hepatic Function Latest Ref Rng & Units 04/02/2021 02/08/2021 04/07/2020  Total Protein 6.1 - 8.1 g/dL 7.4 6.9 -  Albumin 3.5 -  5.2 g/dL - 3.9 4.1  AST 10 - 35 U/L 16 14 -  ALT 9 - 46 U/L 21 21 -  Alk Phosphatase 39 - 117 U/L - 102 -  Total Bilirubin 0.2 - 1.2 mg/dL 0.6 0.5 -  Bilirubin, Direct 0.0 - 0.2 mg/dL - - -    Lab Results  Component Value Date/Time   TSH 1.10 04/07/2020 08:39 AM   TSH 0.716 07/18/2019 11:33 AM   TSH 1.47 10/03/2016 08:17 AM   FREET4 0.91 11/01/2013 03:06 PM   FREET4 0.9 05/27/2009 10:50 AM    CBC Latest Ref Rng & Units 04/02/2021 02/08/2021 10/05/2020  WBC 3.8 - 10.8 Thousand/uL 7.9 6.6 -   Hemoglobin 13.2 - 17.1 g/dL 13.8 13.0 13.3  Hematocrit 38.5 - 50.0 % 42.2 38.8(L) 39.0  Platelets 140 - 400 Thousand/uL 182 143.0(L) -    Lab Results  Component Value Date/Time   VD25OH 43.81 02/08/2021 08:02 AM   VD25OH 38.35 01/22/2020 07:28 AM    Clinical ASCVD: No  The ASCVD Risk score (Arnett DK, et al., 2019) failed to calculate for the following reasons:   The valid total cholesterol range is 130 to 320 mg/dL    Depression screen [facility] 2/9 02/15/2021 01/22/2020 10/15/2018  Decreased Interest 0 0 0  Down, Depressed, Hopeless 0 0 0  PHQ - 2 Score 0 0 0  Altered sleeping 2 0 0  Tired, decreased energy 2 0 0  Change in appetite 1 0 0  Feeling bad or failure about yourself  0 0 0  Trouble concentrating 0 0 0  Moving slowly or fidgety/restless 0 0 0  Suicidal thoughts 0 0 0  PHQ-9 Score 5 0 0  Difficult doing work/chores - Not difficult at all Not difficult at all  Some recent data might be hidden    Social History   Tobacco Use  Smoking Status Never  Smokeless Tobacco Former   Types: Chew   BP Readings from Last 3 Encounters:  04/13/21 128/88  03/29/21 133/89  03/23/21 (!) 151/89   Pulse Readings from Last 3 Encounters:  04/13/21 84  03/29/21 79  03/23/21 85   Wt Readings from Last 3 Encounters:  04/13/21 258 lb (117 kg)  04/02/21 257 lb (116.6 kg)  03/29/21 266 lb (120.7 kg)   BMI Readings from Last 3 Encounters:  04/13/21 41.02 kg/m  04/02/21 42.77 kg/m  03/29/21 42.93 kg/m    Assessment/Interventions: Review of patient past medical history, allergies, medications, health status, including review of consultants reports, laboratory and other test data, was performed as part of comprehensive evaluation and provision of chronic care management services.   SDOH:  (Social Determinants of Health) assessments and interventions performed: Yes SDOH Interventions    Flowsheet Row Most Recent Value  SDOH Interventions   Financial Strain Interventions  Intervention Not Indicated       SDOH Screenings   Alcohol Screen: Not on file  Depression (PHQ2-9): Medium Risk   PHQ-2 Score: 5  Financial Resource Strain: Low Risk    Difficulty of Paying Living Expenses: Not very hard  Food Insecurity: Not on file  Housing: Not on file  Physical Activity: Not on file  Social Connections: Not on file  Stress: Not on file  Tobacco Use: Medium Risk   Smoking Tobacco Use: Never   Smokeless Tobacco Use: Former  Transport planner Needs: Not on file    CCM Care Plan  Allergies  Allergen Reactions   Tolmetin Rash    cirrhosis  Ambien [Zolpidem Tartrate] Other (See Comments)    Over-toxicity from liver failure   Morphine And Related     Hallucinations.   Hydrochlorothiazide W-Triamterene Other (See Comments)    REACTION: dizzy, nausea   Lisinopril Other (See Comments)    REACTION: cough, decreased libido    Medications Reviewed Today     Reviewed by Debbora Dus, [facility] (Pharmacist) on 04/13/21 at 1046  Med List Status: <None>   Medication Order Taking? Sig Documenting Provider Last Dose Status Informant  Accu-Chek FastClix Lancets MISC [id number] No USE THREE TIMES DAILY TO CHECK SUGARS E65.11, INSULIN USE Ria Bush, MD Taking Active   ACCU-CHEK GUIDE test strip [id number] No USE AS INSTRUCTED TO CHECK SUGARS THREE TIMES DAILY E11.65, INSULIN USE Ria Bush, MD Taking Active   acetaminophen (TYLENOL) 500 MG tablet [id number] No Take 500 mg by mouth every 6 (six) hours as needed for mild pain. [provider] Taking Active Spouse/Significant Other  allopurinol (ZYLOPRIM) 300 MG tablet [id number] No Take 1 tablet (300 mg total) by mouth daily. Ria Bush, MD Taking Active   ASPIR-LOW 81 MG EC tablet [id number] No Take 1 tablet (81 mg total) by mouth daily. Ria Bush, MD Taking Active   B-D UF III MINI PEN NEEDLES 31G X 5 MM MISC [id number] No USE TO INJECT INSULIN DAILY Ria Bush, MD Taking Active    Blood Glucose Monitoring Suppl (ACCU-CHEK GUIDE) w/Device KIT [id number] No 1 Units by Does not apply route as directed. Ria Bush, MD Taking Active Spouse/Significant Other  Cholecalciferol (VITAMIN D3) 2000 units TABS [id number] No Take 1 tablet by mouth at bedtime. [provider] Taking Active Spouse/Significant Other  docusate sodium (COLACE) 100 MG capsule [id number] No Take 100 mg by mouth daily as needed for mild constipation.  [provider] Taking Active Spouse/Significant Other  Dulaglutide (TRULICITY) 6.22 QJ/3.3LK SOPN [id number] No Inject into the skin. Inject 0.75 mg once weekly  Patient not taking: Reported on 04/13/2021   [provider] Not Taking Active   empagliflozin (JARDIANCE) 10 MG TABS tablet [id number] No Take 1 tablet (10 mg total) by mouth daily before breakfast. Ria Bush, MD Taking Active   escitalopram (LEXAPRO) 10 MG tablet [id number] No TAKE 1 TABLET EVERY DAY Ria Bush, MD Taking Active   fluticasone [facility]) 50 MCG/ACT nasal spray [id number] No SPRAY 2 SPRAYS INTO EACH NOSTRIL EVERY DAY Ria Bush, MD Taking Active   furosemide (LASIX) 40 MG tablet [id number] No Take 1 tablet (40 mg total) by mouth daily. Freada Bergeron, MD Taking Active   glucose blood (ACCU-CHEK GUIDE) test strip [id number] No Check sugars three times daily and as needed when feeling ill E11.65, insulin use Ria Bush, MD Taking Active Spouse/Significant Other  insulin glargine (LANTUS SOLOSTAR) 100 UNIT/ML Solostar Pen [id number] No Inject 100 Units into the skin every morning. And pen needles 1/day Renato Shin, MD Taking Active   loratadine (CLARITIN) 10 MG tablet [id number] No Take 10 mg by mouth daily. [provider] Taking Active Spouse/Significant Other  losartan (COZAAR) 50 MG tablet [id number] No Take 1 tablet (50 mg total) by mouth daily. Ria Bush, MD Taking Active   lovastatin (MEVACOR) 20 MG tablet  [id number] No Take 1 tablet (20 mg total) by mouth at bedtime. Ria Bush, MD Taking Active   Magnesium 500 MG TABS [id number] No Take 1 tablet (500 mg total) by mouth every Monday, Wednesday, and Friday. Ria Bush, MD Taking Active   metFORMIN (GLUCOPHAGE-XR) 500 MG 24 hr tablet  [id number] No TAKE 1 TABLET BY MOUTH EVERY DAY WITH Sander Radon, MD Taking Active   Multiple Vitamins-Minerals ([facility] SPECTRAVITE PO) [id number] No Take 1 tablet by mouth at bedtime. [provider] Taking Active Spouse/Significant Other  mycophenolate (MYFORTIC) 360 MG TBEC EC tablet [id number] No Take 360 mg by mouth 2 (two) times daily. [provider] Taking Active   Omega-3 Fatty Acids (FISH OIL PO) [id number] No Take 1 tablet by mouth at bedtime. [provider] Taking Active Spouse/Significant Other  omeprazole (PRILOSEC) 40 MG capsule [id number] No Take 1 capsule (40 mg total) by mouth daily. Ria Bush, MD Taking Active   oxyCODONE (ROXICODONE) 5 MG immediate release tablet [id number] No 1-2 tabs PO q6 hours prn pain Leanora Cover, MD Taking Active   tacrolimus (PROGRAF) 1 MG capsule [id number] No Take 1-2 mg by mouth See admin instructions. Take 2 mg by mouth in the morning, then take 1 mg  by mouth in the evening [provider] Taking Active Spouse/Significant Other            Patient Active Problem List   Diagnosis Date Noted   Diarrhea of presumed infectious origin 04/02/2021   Medicare annual wellness visit, subsequent 02/15/2021   Immunosuppressed status ([place]) 11/10/2020   Chronic right-sided thoracic back pain 04/07/2020   Memory loss 04/07/2020   Perianal pruritus 02/06/2020   Prostate cancer ([place]) 02/06/2020   Advanced directives, counseling/discussion 02/06/2020   Pain, dental 08/21/2019   Orthostatic hypotension 07/31/2019   Syncope 07/30/2019   Left rib fracture 07/27/2019   Pneumonia due to COVID-19 virus 07/18/2019   [place]  lesion, left 02/05/2018   Acute sinusitis 10/23/2017   Chronic heel pain, left 10/14/2017   Anejaculation 08/09/2017   Chronic gout 05/22/2017   Dyspnea 11/18/2016   BPH (benign prostatic hyperplasia) 10/05/2016   Status post liver transplant ([place]) 03/15/2016   Portal hypertensive gastropathy ([place]) 12/04/2015   Type 2 diabetes mellitus with other specified complication ([place]) 37/29/0211   Gynecomastia    CKD (chronic kidney disease) stage 3, GFR 30-59 ml/min (HCC) 11/04/2015   Depression, major, single episode, moderate ([place]) 07/09/2015   Headache 02/19/2015   Insomnia 12/06/2014   Hypomagnesemia    Other fatigue 11/18/2014   Internal hemorrhoid 10/01/2014   Erectile dysfunction 15/52/0802   Alcoholic cirrhosis of liver without ascites ([place]) 07/28/2014   (HFpEF) heart failure with preserved ejection fraction ([place]) 11/01/2013   Essential hypertension 06/04/2013   OSA on CPAP 07/11/2012   History of alcohol dependence ([place]) 07/11/2012   Thrombocytopenia ([place]) 12/15/2011   Morbid obesity with BMI of 40.0-44.9, adult ([place]) 11/16/2011   Routine general medical examination at a health care facility 09/22/2010   NEUROPATHY 05/27/2009   Pedal edema 09/22/2008   Dyslipidemia associated with type 2 diabetes mellitus ([place]) 02/15/2007   Allergic rhinitis 02/15/2007   GERD 02/15/2007    Immunization History  Administered Date(s) Administered   Hep A / Hep B 01/16/2015, 01/23/2015, 02/06/2015   Hepatitis B, adult 12/04/2015   Influenza Whole 03/24/2010   Influenza,inj,Quad PF,6+ Mos 04/22/2015, 04/06/2017, 04/23/2018, 04/23/2019, 04/07/2020   PFIZER(Purple Top)SARS-COV-2 Vaccination 11/07/2019, 11/28/2019, 05/07/2020, 12/04/2020   PNEUMOCOCCAL CONJUGATE-20 02/15/2021   Pneumococcal Polysaccharide-23 04/22/2015   Td 05/01/2007   Tdap 12/14/2015, 10/05/2020    Conditions to be addressed/monitored:  Hyperlipidemia and Diabetes  Care Plan : [place]  Updates made by  Debbora Dus, [place] since 04/13/2021 12:00 AM     Problem: CHL AMB "PATIENT-SPECIFIC PROBLEM"  Long-Range Goal: Disease Management   Start Date: 03/02/2021  This Visit's Progress: On track  Priority: High  Note:    Current Barriers:  Unable to achieve control of diabetes - improving   Pharmacist Clinical Goal(s):  Patient will contact provider office for questions/concerns as evidenced notation of same in electronic health record through collaboration with PharmD and provider.   Interventions: 1:1 collaboration with Ria Bush, MD regarding development and update of comprehensive plan of care as evidenced by provider attestation and co-signature Inter-disciplinary care team collaboration (see longitudinal plan of care) Comprehensive medication review performed; medication list updated in electronic medical record  Hyperlipidemia: (LDL goal < 100 -Did not discuss today due to stable/controlled -Controlled - refills timely, LDL 38 but TG elevated above goal secondary to DM. -Current treatment: Lovastatin 20 mg - 1 tablet daily at bedtime  -Medications previously tried: none  -Recommended to continue current medication  Diabetes (A1c goal <8%) - Improving - A1c improved from 11.5 to 8.6% 02/2021, Pt restarted Jardiance and Trulicity ~2/99/37. Lantus also approved through end of 2022 PAP since last visit. -Current medications: Trulicity 1.69CV/8.9 mL - Inject once weekly on _0     PAP Lantus  02/12/21            90    PAP  Patient's preferred pharmacy is:  [id number] [place] [place] [place]Phone: [phone]: [phone] [address]Phone: [phone]: [phone] [id number] [address] [address] [place]: [phone]: [phone] Plan and Follow Up Patient Decision:  Patient agrees to Care Plan and Follow-up.  MiDebbora DusPharmD Clinical Pharmacist [facility] at [phone]

## 2021-04-13 NOTE — Telephone Encounter (Signed)
Received cardiac clearance (see cardio note) OK to stop ASA 7 days prior

## 2021-04-13 NOTE — Patient Instructions (Signed)
Dear [name],  Below is a summary of the goals we discussed during our follow up appointment on April 13, 2021. Please contact me anytime with questions or concerns.   Visit Information  Patient Care Plan: CCM Pharmacy Care Plan     Problem Identified: CHL AMB "PATIENT-SPECIFIC PROBLEM"      Long-Range Goal: Disease Management   Start Date: 03/02/2021  This Visit's Progress: On track  Priority: High  Note:    Current Barriers:  Unable to achieve control of diabetes - improving   Pharmacist Clinical Goal(s):  Patient will contact provider office for questions/concerns as evidenced notation of same in electronic health record through collaboration with PharmD and provider.   Interventions: 1:1 collaboration with Ria Bush, MD regarding development and update of comprehensive plan of care as evidenced by provider attestation and co-signature Inter-disciplinary care team collaboration (see longitudinal plan of care) Comprehensive medication review performed; medication list updated in electronic medical record  Hyperlipidemia: (LDL goal < 100 -Did not discuss today due to stable/controlled -Controlled - refills timely, LDL 38 but TG elevated above goal secondary to DM. -Current treatment: Lovastatin 20 mg - 1 tablet daily at bedtime  -Medications previously tried: none  -Recommended to continue current medication  Diabetes (A1c goal <8%) - Improving - A1c improved from 11.5 to 8.6% 02/2021, Pt restarted Jardiance and Trulicity ~4/94/49. Lantus also approved through end of 2022 PAP since last visit. -Current medications: Trulicity 0.75mg /0.5 mL - Inject once weekly on Sundays (HOLD since 04/11/21) Jardiance 10mg  - Take 1 tablet daily  Lantus 100unit/mL - Injects 102 units daily at bedtime (each pen lasts 3 days) Metformin XR 500 mg - Take 1 tablet at breakfast -Medications previously tried: none   -Current home glucose readings  fasting glucose: none to  report, does state his BG has been in 130s recently (not eating much) post prandial glucose: n/a -Denies hypoglycemic/hyperglycemic symptoms -Recommend check home BG several days per week and send these to CCM team. Follow up in 4 weeks to consider restart Trulicity.  Patient Goals/Self-Care Activities Patient will:  - take medications as prescribed  Follow Up Plan: Telephone follow up appointment with care management team member scheduled for:  30 days       Patient verbalizes understanding of instructions provided today and agrees to view in [place].   Debbora Dus, PharmD Clinical Pharmacist [facility] at [facility] [phone]

## 2021-04-13 NOTE — Patient Instructions (Addendum)
Medication Instructions:  Continue your current medications.   Hold your Aspirin 7 days prior to seed implant.   *If you need a refill on your cardiac medications before your next appointment, please call your pharmacy*  Lab Work: None ordered today.   Testing/Procedures: Your EKG today showed normal sinus rhythm which is a good result!   Follow-Up: At [facility], you and your health needs are our priority.  As part of our continuing mission to provide you with exceptional heart care, we have created designated Provider Care Teams.  These Care Teams include your primary Cardiologist (physician) and Advanced Practice Providers (APPs -  Physician Assistants and Nurse Practitioners) who all work together to provide you with the care you need, when you need it.  We recommend signing up for the patient portal called "MyChart".  Sign up information is provided on this After Visit Summary.  MyChart is used to connect with patients for Virtual Visits (Telemedicine).  Patients are able to view lab/test results, encounter notes, upcoming appointments, etc.  Non-urgent messages can be sent to your provider as well.   To learn more about what you can do with MyChart, go to [email or web address].    Your next appointment:   6 month(s)  The format for your next appointment:   In Person  Provider:   You may see Freada Bergeron, MD or one of the following Advanced Practice Providers on your designated Care Team:   Richardson Dopp, PA-C [place], [place] Loel Dubonnet, NP    Other Instructions  Loel Dubonnet, NP will send a note to Dr. Erlene Quan that you are cleared for your procedure.   Heart Healthy Diet Recommendations: A low-salt diet is recommended. Meats should be grilled, baked, or boiled. Avoid fried foods. Focus on lean protein sources like fish or chicken with vegetables and fruits. The American Heart Association is a Microbiologist!  Exercise recommendations: The American  Heart Association recommends 150 minutes of moderate intensity exercise weekly. Try 30 minutes of moderate intensity exercise 4-5 times per week. This could include walking, jogging, or swimming.

## 2021-04-15 ENCOUNTER — Encounter
Admission: RE | Admit: 2021-04-15 | Discharge: 2021-04-15 | Disposition: A | Discharge status: Home / Self Care | Payer: Medicare HMO | Source: Ambulatory Visit | Attending: Radiation Oncology | Admitting: Radiation Oncology

## 2021-04-15 ENCOUNTER — Other Ambulatory Visit: Payer: Self-pay

## 2021-04-15 ENCOUNTER — Ambulatory Visit
Admission: RE | Admit: 2021-04-15 | Discharge: 2021-04-15 | Disposition: A | Discharge status: Home / Self Care | Payer: Medicare HMO | Source: Ambulatory Visit | Attending: Urology | Admitting: Urology

## 2021-04-15 DIAGNOSIS — C61 Malignant neoplasm of prostate: Secondary | ICD-10-CM

## 2021-04-15 DIAGNOSIS — M25552 Pain in left hip: Secondary | ICD-10-CM | POA: Diagnosis not present

## 2021-04-15 DIAGNOSIS — R972 Elevated prostate specific antigen [PSA]: Secondary | ICD-10-CM | POA: Diagnosis not present

## 2021-04-15 DIAGNOSIS — K760 Fatty (change of) liver, not elsewhere classified: Secondary | ICD-10-CM | POA: Diagnosis not present

## 2021-04-15 DIAGNOSIS — M47814 Spondylosis without myelopathy or radiculopathy, thoracic region: Secondary | ICD-10-CM | POA: Diagnosis not present

## 2021-04-15 MED ORDER — TECHNETIUM TC 99M MEDRONATE IV KIT
20.0000 | PACK | Freq: Once | INTRAVENOUS | Status: AC | PRN
  Administered 2021-04-15: 23.5 via INTRAVENOUS

## 2021-04-15 MED ORDER — IOHEXOL 350 MG/ML SOLN: 100.0000 mL | Freq: Once | INTRAVENOUS | Status: DC | PRN

## 2021-04-15 MED ORDER — IOHEXOL 300 MG/ML  SOLN
100.0000 mL | Freq: Once | INTRAMUSCULAR | Status: AC | PRN
  Administered 2021-04-15: 100 mL via INTRAVENOUS

## 2021-04-21 ENCOUNTER — Telehealth: Payer: Self-pay

## 2021-04-21 NOTE — Progress Notes (Signed)
    Chronic Care Management Pharmacy Assistant   Name: Colton Porter  MRN: [id number] DOB: November 28, 1965  Reason for Encounter: Trulicity Patient Assistance ([place] [employer])    04/21/21 - Spoke with patient as we received a message from [name], [place] that Dr. [name] received a fax from [facility] that patient's Trulicity assistance was ending and patient would need to re-enroll. Patient stated he is not taking Trulicity any longer as it was giving him GI issues. Patient stated he does not need the assistance; if he does in the future we can fill out an enrollment form then.   Debbora Dus, CPP notified  [name], [place] Clinical Pharmacy Assistant [phone]  Time Spent: 10 Minutes

## 2021-04-26 DIAGNOSIS — E1169 Type 2 diabetes mellitus with other specified complication: Secondary | ICD-10-CM | POA: Diagnosis not present

## 2021-04-26 DIAGNOSIS — Z794 Long term (current) use of insulin: Secondary | ICD-10-CM

## 2021-04-26 DIAGNOSIS — E785 Hyperlipidemia, unspecified: Secondary | ICD-10-CM

## 2021-04-27 ENCOUNTER — Ambulatory Visit: Payer: Medicare HMO | Admitting: Physician Assistant

## 2021-04-27 NOTE — Progress Notes (Signed)
04/28/2021  CC: gold seed markers  HPI:  Colton Porter is a 55 y.o. male with a personal history of prostate cancer and elevated PSA, who presents today for gold seed and eligard.   His prostate biopsy surgical pathology showed Gleason 3+3 and Gleason 3+4 as below, R >L involvement.  His most recent PSA on 02/24/2021 was 16.8.   He is also due to receive his 19-month Depo of Eligard today.  We reviewed the risk and benefits along with possible side effects.  Prostate Gold Seed Marker Placement Procedure   Informed consent was obtained after discussing risks/benefits of the procedure.  A time out was performed to ensure correct patient identity.  Pre-Procedure: - Gentamicin given prophylactically - PO Levaquin 500 mg also given today  Procedure: -Lidocaine jelly was administered per rectum -Rectal ultrasound probe was placed without difficulty and the prostate visualized - 3 fiducial gold seed markers placed, one at right base, one at left base, one at apex of prostate gland under transrectal ultrasound guidance  Post-Procedure: - Patient tolerated the procedure well - He was counseled to seek immediate medical attention if experiences any severe pain, significant bleeding, or fevers   I,Kailey Littlejohn,acting as a scribe for Hollice Espy, MD.,have documented all relevant documentation on the behalf of Hollice Espy, MD,as directed by  Hollice Espy, MD while in the presence of Hollice Espy, MD.  I have reviewed the above documentation for accuracy and completeness, and I agree with the above.   Hollice Espy, MD

## 2021-04-28 ENCOUNTER — Other Ambulatory Visit: Payer: Self-pay | Admitting: *Deleted

## 2021-04-28 ENCOUNTER — Encounter: Payer: Self-pay | Admitting: Urology

## 2021-04-28 ENCOUNTER — Ambulatory Visit: Payer: Medicare HMO | Admitting: Urology

## 2021-04-28 ENCOUNTER — Other Ambulatory Visit: Payer: Self-pay

## 2021-04-28 VITALS — BP 131/82 | HR 79 | Ht 66.5 in | Wt 256.0 lb

## 2021-04-28 DIAGNOSIS — C61 Malignant neoplasm of prostate: Secondary | ICD-10-CM

## 2021-04-28 DIAGNOSIS — Z298 Encounter for other specified prophylactic measures: Secondary | ICD-10-CM

## 2021-04-28 MED ORDER — LEUPROLIDE ACETATE (6 MONTH) 45 MG ~~LOC~~ KIT
45.0000 mg | PACK | Freq: Once | SUBCUTANEOUS | Status: AC
  Administered 2021-04-28: 45 mg via SUBCUTANEOUS

## 2021-04-28 MED ORDER — LEVOFLOXACIN 500 MG PO TABS
500.0000 mg | ORAL_TABLET | Freq: Once | ORAL | Status: AC
  Administered 2021-04-28: 500 mg via ORAL

## 2021-04-28 MED ORDER — GENTAMICIN SULFATE 40 MG/ML IJ SOLN
80.0000 mg | Freq: Once | INTRAMUSCULAR | Status: AC
  Administered 2021-04-28: 80 mg via INTRAMUSCULAR

## 2021-04-28 NOTE — Patient Instructions (Addendum)
Start: Vitamin D 800-1000iu and Calium 1000-1200mg  daily for  6 months  Leuprolide depot injection What is this medication? LEUPROLIDE (loo PROE lide) is a man-made protein that acts like a natural hormone in the body. It decreases testosterone in men and decreases estrogen in women. In men, this medicine is used to treat advanced prostate cancer. In women, some forms of this medicine may be used to treat endometriosis, uterine fibroids, or other male hormone-related problems. This medicine may be used for other purposes; ask your health care provider or pharmacist if you have questions. COMMON BRAND NAME(S): Eligard, Fensolv, Lupron Depot, Lupron Depot-Ped, Viadur What should I tell my care team before I take this medication? They need to know if you have any of these conditions: diabetes heart disease or previous heart attack high blood pressure high cholesterol mental illness osteoporosis pain or difficulty passing urine seizures spinal cord metastasis stroke suicidal thoughts, plans, or attempt; a previous suicide attempt by you or a family member tobacco smoker unusual vaginal bleeding (women) an unusual or allergic reaction to leuprolide, benzyl alcohol, other medicines, foods, dyes, or preservatives pregnant or trying to get pregnant breast-feeding How should I use this medication? This medicine is for injection into a muscle or for injection under the skin. It is given by a health care professional in a hospital or clinic setting. The specific product will determine how it will be given to you. Make sure you understand which product you receive and how often you will receive it. Talk to your pediatrician regarding the use of this medicine in children. Special care may be needed. Overdosage: If you think you have taken too much of this medicine contact a poison control center or emergency room at once. NOTE: This medicine is only for you. Do not share this medicine with  others. What if I miss a dose? It is important not to miss a dose. Call your doctor or health care professional if you are unable to keep an appointment. Depot injections: Depot injections are given either once-monthly, every 12 weeks, every 16 weeks, or every 24 weeks depending on the product you are prescribed. The product you are prescribed will be based on if you are male or male, and your condition. Make sure you understand your product and dosing. What may interact with this medication? Do not take this medicine with any of the following medications: chasteberry cisapride dronedarone pimozide thioridazine This medicine may also interact with the following medications: herbal or dietary supplements, like black cohosh or DHEA male hormones, like estrogens or progestins and birth control pills, patches, rings, or injections male hormones, like testosterone other medicines that prolong the QT interval (abnormal heart rhythm) This list may not describe all possible interactions. Give your health care provider a list of all the medicines, herbs, non-prescription drugs, or dietary supplements you use. Also tell them if you smoke, drink alcohol, or use illegal drugs. Some items may interact with your medicine. What should I watch for while using this medication? Visit your doctor or health care professional for regular checks on your progress. During the first weeks of treatment, your symptoms may get worse, but then will improve as you continue your treatment. You may get hot flashes, increased bone pain, increased difficulty passing urine, or an aggravation of nerve symptoms. Discuss these effects with your doctor or health care professional, some of them may improve with continued use of this medicine. Male patients may experience a menstrual cycle or spotting during the first  months of therapy with this medicine. If this continues, contact your doctor or health care professional. This  medicine may increase blood sugar. Ask your healthcare provider if changes in diet or medicines are needed if you have diabetes. What side effects may I notice from receiving this medication? Side effects that you should report to your doctor or health care professional as soon as possible: allergic reactions like skin rash, itching or hives, swelling of the face, lips, or tongue breathing problems chest pain depression or memory disorders pain in your legs or groin pain at site where injected or implanted seizures severe headache signs and symptoms of high blood sugar such as being more thirsty or hungry or having to urinate more than normal. You may also feel very tired or have blurry vision swelling of the feet and legs suicidal thoughts or other mood changes visual changes vomiting Side effects that usually do not require medical attention (report to your doctor or health care professional if they continue or are bothersome): breast swelling or tenderness decrease in sex drive or performance diarrhea hot flashes loss of appetite muscle, joint, or bone pains nausea redness or irritation at site where injected or implanted skin problems or acne This list may not describe all possible side effects. Call your doctor for medical advice about side effects. You may report side effects to FDA at 1-800-FDA-1088. Where should I keep my medication? This drug is given in a hospital or clinic and will not be stored at home. NOTE: This sheet is a summary. It may not cover all possible information. If you have questions about this medicine, talk to your doctor, pharmacist, or health care provider.  2022 Elsevier/Gold Standard (2019-05-15 10:35:13)

## 2021-04-28 NOTE — Progress Notes (Signed)
Eligard SubQ Injection   Due to Prostate Cancer patient is present today for a Eligard Injection.  Medication: Eligard 6 month Dose: 45 mg  Location: left arm Lot: [id number] Exp: 03/24  Patient tolerated well, no complications were noted  Performed by: Fonnie Jarvis, CMA  Per Dr. Erlene Quan patient is a one time dose of ADT. Patient was given instruction to start Vitamin D 800-1000iu and Calium 1000-1200mg  daily while on Androgen Deprivation Therapy.  PA approval dates: No PA

## 2021-04-29 ENCOUNTER — Encounter: Payer: Self-pay | Admitting: Family Medicine

## 2021-04-30 MED ORDER — METHOCARBAMOL 500 MG PO TABS: 500.0000 mg | ORAL_TABLET | Freq: Three times a day (TID) | ORAL | 0 refills | Status: DC | PRN

## 2021-04-30 NOTE — Addendum Note (Signed)
Addended by: Ria Bush on: 04/30/2021 07:35 AM   Modules accepted: Orders

## 2021-05-03 ENCOUNTER — Ambulatory Visit
Admission: RE | Admit: 2021-05-03 | Discharge: 2021-05-03 | Disposition: A | Discharge status: Home / Self Care | Payer: Medicare HMO | Source: Ambulatory Visit | Attending: Radiation Oncology | Admitting: Radiation Oncology

## 2021-05-03 DIAGNOSIS — Z510 Encounter for antineoplastic radiation therapy: Secondary | ICD-10-CM | POA: Diagnosis not present

## 2021-05-03 DIAGNOSIS — C61 Malignant neoplasm of prostate: Secondary | ICD-10-CM | POA: Diagnosis not present

## 2021-05-04 ENCOUNTER — Telehealth: Payer: Self-pay

## 2021-05-04 DIAGNOSIS — Z510 Encounter for antineoplastic radiation therapy: Secondary | ICD-10-CM | POA: Diagnosis not present

## 2021-05-04 DIAGNOSIS — C61 Malignant neoplasm of prostate: Secondary | ICD-10-CM | POA: Diagnosis not present

## 2021-05-04 NOTE — Progress Notes (Signed)
Chronic Care Management Pharmacy Assistant   Name: Colton Porter  MRN: [id number] DOB: 06-03-1966  Reason for Encounter: CCM (Reminder Call)   Medications: Outpatient Encounter Medications as of 05/04/2021  Medication Sig   Accu-Chek FastClix Lancets MISC USE THREE TIMES DAILY TO CHECK SUGARS E65.11, INSULIN USE   ACCU-CHEK GUIDE test strip USE AS INSTRUCTED TO CHECK SUGARS THREE TIMES DAILY E11.65, INSULIN USE   acetaminophen (TYLENOL) 500 MG tablet Take 500 mg by mouth every 6 (six) hours as needed for mild pain.   allopurinol (ZYLOPRIM) 300 MG tablet Take 1 tablet (300 mg total) by mouth daily.   ASPIR-LOW 81 MG EC tablet Take 1 tablet (81 mg total) by mouth daily. (Patient not taking: Reported on 04/28/2021)   B-D UF III MINI PEN NEEDLES 31G X 5 MM MISC USE TO INJECT INSULIN DAILY   Blood Glucose Monitoring Suppl (ACCU-CHEK GUIDE) w/Device KIT 1 Units by Does not apply route as directed.   Cholecalciferol (VITAMIN D3) 2000 units TABS Take 1 tablet by mouth at bedtime.   docusate sodium (COLACE) 100 MG capsule Take 100 mg by mouth daily as needed for mild constipation.    empagliflozin (JARDIANCE) 10 MG TABS tablet Take 1 tablet (10 mg total) by mouth daily before breakfast.   escitalopram (LEXAPRO) 10 MG tablet TAKE 1 TABLET EVERY DAY   fluticasone (FLONASE) 50 MCG/ACT nasal spray SPRAY 2 SPRAYS INTO EACH NOSTRIL EVERY DAY   furosemide (LASIX) 40 MG tablet Take 1 tablet (40 mg total) by mouth daily.   glucose blood (ACCU-CHEK GUIDE) test strip Check sugars three times daily and as needed when feeling ill E11.65, insulin use   insulin glargine (LANTUS SOLOSTAR) 100 UNIT/ML Solostar Pen Inject 100 Units into the skin every morning. And pen needles 1/day   loratadine (CLARITIN) 10 MG tablet Take 10 mg by mouth daily.   losartan (COZAAR) 50 MG tablet Take 1 tablet (50 mg total) by mouth daily.   lovastatin (MEVACOR) 20 MG tablet Take 1 tablet (20 mg total) by mouth at bedtime.    Magnesium 500 MG TABS Take 1 tablet (500 mg total) by mouth every Monday, Wednesday, and Friday.   metFORMIN (GLUCOPHAGE-XR) 500 MG 24 hr tablet TAKE 1 TABLET BY MOUTH EVERY DAY WITH BREAKFAST   methocarbamol (ROBAXIN) 500 MG tablet Take 1 tablet (500 mg total) by mouth 3 (three) times daily as needed for muscle spasms (sedation precautions).   Multiple Vitamins-Minerals ([employer] SPECTRAVITE PO) Take 1 tablet by mouth at bedtime.   mycophenolate (MYFORTIC) 360 MG TBEC EC tablet Take 360 mg by mouth 2 (two) times daily.   Omega-3 Fatty Acids (FISH OIL PO) Take 1 tablet by mouth at bedtime.   omeprazole (PRILOSEC) 40 MG capsule Take 1 capsule (40 mg total) by mouth daily.   oxyCODONE (ROXICODONE) 5 MG immediate release tablet 1-2 tabs PO q6 hours prn pain   tacrolimus (PROGRAF) 1 MG capsule Take 1-2 mg by mouth See admin instructions. Take 2 mg by mouth in the morning, then take 1 mg  by mouth in the evening   No facility-administered encounter medications on file as of 05/04/2021.   Message was left for Colton Porter to remind him of his upcoming telephone visit with Colton Porter on 05/10/2021 at 3:30 pm. Patient was reminded to have all medications, supplements and any blood glucose and blood pressure readings available for review at appointment.  Star Rating Drugs: Medication:   Last Fill: Day Supply Metformin  XR '500mg'$   04/10/2021 90 Losartan $RemoveBef'50mg'[id number]$   03/15/2021 90 Lovastatin $RemoveBefor'20mg'[id number]$   16/83/7290 90 Trulicity                                   PAP Jardiance                                PAP Lantus                                     PAP  Colton Porter, CPP notified  Colton Porter, [place] Pharmacy Assistant [phone]  Time Spent:  10 Minutes

## 2021-05-07 ENCOUNTER — Other Ambulatory Visit: Payer: Self-pay | Admitting: *Deleted

## 2021-05-07 DIAGNOSIS — C61 Malignant neoplasm of prostate: Secondary | ICD-10-CM

## 2021-05-10 ENCOUNTER — Ambulatory Visit (INDEPENDENT_AMBULATORY_CARE_PROVIDER_SITE_OTHER): Payer: Medicare HMO

## 2021-05-10 ENCOUNTER — Other Ambulatory Visit: Payer: Self-pay

## 2021-05-10 DIAGNOSIS — E1169 Type 2 diabetes mellitus with other specified complication: Secondary | ICD-10-CM

## 2021-05-10 DIAGNOSIS — E785 Hyperlipidemia, unspecified: Secondary | ICD-10-CM

## 2021-05-10 NOTE — Progress Notes (Signed)
Chronic Care Management Pharmacy Note  05/10/2021 Name:  Colton Porter MRN:  [id number] DOB:  1966-02-15  Summary: He has not checked his BG recently. He is still off Trulicity. I would like to see how his BG is doing without it. He will check his BG this week and send [name] an update. No diarrhea since off Trulicity but unknown if this was the cause.  Recommendations/Changes made from today's visit: Recommend monitoring home BG several days per week with fasting goal (80-130) and post-meal goal (less than 180). Recommend retrial Trulicity low dose (9.56 mg) pending BG log update.  Plan: Follow up 30 days    Subjective: Colton Porter is an 55 y.o. year old male who is a primary patient of Ria Bush, MD.  The CCM team was consulted for assistance with disease management and care coordination needs.    Engaged with patient by telephone for follow up visit in response to provider referral for pharmacy case management and/or care coordination services.   Consent to Services:  The patient was given information about Chronic Care Management services, agreed to services, and gave verbal consent prior to initiation of services.  Please see initial visit note for detailed documentation.   Patient Care Team: Ria Bush, MD as PCP - General (Family Medicine) Freada Bergeron, MD as PCP - Cardiology (Cardiology) Debbora Dus, [facility] as Pharmacist (Pharmacist)  Recent office visits: 04/29/21 - PCP message - Back pain, trial robaxin PRN   Recent consult visits:  04/28/21 - Urology - Procedural visit 03/23/21 - Urology - Prostate cancer follow up, referred to radiation oncology. Start Eligard.  03/22/21 - Endocrinology - A1c improved, 8.6%. Follow up with PCP.   Hospital visits:  None in previous 6 months  Objective:  Lab Results  Component Value Date   CREATININE 1.31 (H) 04/02/2021   BUN 14 04/02/2021   GFR 68.61 02/08/2021   GFRNONAA >60 08/02/2019   GFRAA >60  08/02/2019   NA 140 04/02/2021   K 4.1 04/02/2021   CALCIUM 9.2 04/02/2021   CO2 26 04/02/2021   GLUCOSE 138 (H) 04/02/2021   Lab Results  Component Value Date/Time   HGBA1C 8.6 (A) 03/22/2021 08:40 AM   HGBA1C 11.5 (H) 02/08/2021 08:02 AM   HGBA1C 9.2 (A) 09/28/2020 08:45 AM   HGBA1C 10.6 (H) 01/22/2020 07:28 AM   FRUCTOSAMINE 436 (H) 04/22/2020 08:35 AM   FRUCTOSAMINE 232 08/02/2017 08:26 AM   GFR 68.61 02/08/2021 08:02 AM   GFR 70.48 04/07/2020 08:39 AM   MICROALBUR <0.7 08/27/2014 09:51 AM    Last diabetic Eye exam:  Lab Results  Component Value Date/Time   HMDIABEYEEXA No Retinopathy 03/20/2020 12:00 AM     Lab Results  Component Value Date   CHOL 98 02/08/2021   HDL 26.50 (L) 02/08/2021   LDLCALC 44 10/06/2017   LDLDIRECT 38.0 02/08/2021   TRIG (H) 02/08/2021    409.0 Triglyceride is over 400; calculations on Lipids are invalid.   CHOLHDL 4 02/08/2021    Hepatic Function Latest Ref Rng & Units 04/02/2021 02/08/2021 04/07/2020  Total Protein 6.1 - 8.1 g/dL 7.4 6.9 -  Albumin 3.5 - 5.2 g/dL - 3.9 4.1  AST 10 - 35 U/L 16 14 -  ALT 9 - 46 U/L 21 21 -  Alk Phosphatase 39 - 117 U/L - 102 -  Total Bilirubin 0.2 - 1.2 mg/dL 0.6 0.5 -  Bilirubin, Direct 0.0 - 0.2 mg/dL - - -    Lab Results  Component Value Date/Time   TSH 1.10 04/07/2020 08:39 AM   TSH 0.716 07/18/2019 11:33 AM   TSH 1.47 10/03/2016 08:17 AM   FREET4 0.91 11/01/2013 03:06 PM   FREET4 0.9 05/27/2009 10:50 AM    CBC Latest Ref Rng & Units 04/02/2021 02/08/2021 10/05/2020  WBC 3.8 - 10.8 Thousand/uL 7.9 6.6 -  Hemoglobin 13.2 - 17.1 g/dL 13.8 13.0 13.3  Hematocrit 38.5 - 50.0 % 42.2 38.8(L) 39.0  Platelets 140 - 400 Thousand/uL 182 143.0(L) -    Lab Results  Component Value Date/Time   VD25OH 43.81 02/08/2021 08:02 AM   VD25OH 38.35 01/22/2020 07:28 AM    Clinical ASCVD: No  The ASCVD Risk score (Arnett DK, et al., 2019) failed to calculate for the following reasons:   The valid total  cholesterol range is 130 to 320 mg/dL    Depression screen [facility] 2/9 02/15/2021 01/22/2020 10/15/2018  Decreased Interest 0 0 0  Down, Depressed, Hopeless 0 0 0  PHQ - 2 Score 0 0 0  Altered sleeping 2 0 0  Tired, decreased energy 2 0 0  Change in appetite 1 0 0  Feeling bad or failure about yourself  0 0 0  Trouble concentrating 0 0 0  Moving slowly or fidgety/restless 0 0 0  Suicidal thoughts 0 0 0  PHQ-9 Score 5 0 0  Difficult doing work/chores - Not difficult at all Not difficult at all  Some recent data might be hidden    Social History   Tobacco Use  Smoking Status Never  Smokeless Tobacco Former   Types: Chew   BP Readings from Last 3 Encounters:  04/28/21 131/82  04/13/21 128/88  03/29/21 133/89   Pulse Readings from Last 3 Encounters:  04/28/21 79  04/13/21 84  03/29/21 79   Wt Readings from Last 3 Encounters:  04/28/21 256 lb (116.1 kg)  04/13/21 258 lb (117 kg)  04/02/21 257 lb (116.6 kg)   BMI Readings from Last 3 Encounters:  04/28/21 40.70 kg/m  04/13/21 41.02 kg/m  04/02/21 42.77 kg/m    Assessment/Interventions: Review of patient past medical history, allergies, medications, health status, including review of consultants reports, laboratory and other test data, was performed as part of comprehensive evaluation and provision of chronic care management services.   SDOH:  (Social Determinants of Health) assessments and interventions performed: Yes    SDOH Screenings   Alcohol Screen: Not on file  Depression (PHQ2-9): Medium Risk   PHQ-2 Score: 5  Financial Resource Strain: Low Risk    Difficulty of Paying Living Expenses: Not very hard  Food Insecurity: Not on file  Housing: Not on file  Physical Activity: Not on file  Social Connections: Not on file  Stress: Not on file  Tobacco Use: Medium Risk   Smoking Tobacco Use: Never   Smokeless Tobacco Use: Former   Passive Exposure: Not on file  Transportation Needs: Not on file    CCM Care  Plan  Allergies  Allergen Reactions   Tolmetin Rash    cirrhosis   Ambien [Zolpidem Tartrate] Other (See Comments)    Over-toxicity from liver failure   Morphine And Related     Hallucinations.   Trulicity [Dulaglutide]     frequency   Hydrochlorothiazide W-Triamterene Other (See Comments)    REACTION: dizzy, nausea   Lisinopril Other (See Comments)    REACTION: cough, decreased libido    Medications Reviewed Today     Reviewed by Royanne Foots, [place] (Certified Medical  Assistant) on 04/28/21 at 1153  Med List Status: <None>   Medication Order Taking? Sig Documenting Provider Last Dose Status Informant  Accu-Chek FastClix Lancets MISC [id number] Yes USE THREE TIMES DAILY TO CHECK SUGARS E65.11, INSULIN USE Ria Bush, MD Taking Active   ACCU-CHEK GUIDE test strip [id number] Yes USE AS INSTRUCTED TO CHECK SUGARS THREE TIMES DAILY E11.65, INSULIN USE Ria Bush, MD Taking Active   acetaminophen (TYLENOL) 500 MG tablet [id number] Yes Take 500 mg by mouth every 6 (six) hours as needed for mild pain. [provider] Taking Active Spouse/Significant Other  allopurinol (ZYLOPRIM) 300 MG tablet [id number] Yes Take 1 tablet (300 mg total) by mouth daily. Ria Bush, MD Taking Active   ASPIR-LOW 81 MG EC tablet [id number] No Take 1 tablet (81 mg total) by mouth daily.  Patient not taking: Reported on 04/28/2021   Ria Bush, MD Not Taking Active   B-D UF III MINI PEN NEEDLES 31G X 5 MM MISC [id number] Yes USE TO [place] Ria Bush, MD Taking Active   Blood Glucose Monitoring Suppl (ACCU-CHEK GUIDE) w/Device KIT [id number] Yes 1 Units by Does not apply route as directed. Ria Bush, MD Taking Active Spouse/Significant Other  Cholecalciferol (VITAMIN D3) 2000 units TABS [id number] Yes Take 1 tablet by mouth at bedtime. [provider] Taking Active Spouse/Significant Other  docusate sodium (COLACE) 100 MG capsule [id number] Yes  Take 100 mg by mouth daily as needed for mild constipation.  [provider] Taking Active Spouse/Significant Other  empagliflozin (JARDIANCE) 10 MG TABS tablet [id number] Yes Take 1 tablet (10 mg total) by mouth daily before breakfast. Ria Bush, MD Taking Active   escitalopram (LEXAPRO) 10 MG tablet [id number] Yes TAKE 1 TABLET EVERY DAY Ria Bush, MD Taking Active   fluticasone [facility]) 50 MCG/ACT nasal spray [id number] Yes SPRAY 2 SPRAYS INTO EACH NOSTRIL EVERY DAY Ria Bush, MD Taking Active   furosemide (LASIX) 40 MG tablet [id number] Yes Take 1 tablet (40 mg total) by mouth daily. Freada Bergeron, MD Taking Active   glucose blood (ACCU-CHEK GUIDE) test strip [id number] Yes Check sugars three times daily and as needed when feeling ill E11.65, insulin use Ria Bush, MD Taking Active Spouse/Significant Other  insulin glargine (LANTUS SOLOSTAR) 100 UNIT/ML Solostar Pen [id number] Yes Inject 100 Units into the skin every morning. And pen needles 1/day Renato Shin, MD Taking Active   loratadine (CLARITIN) 10 MG tablet [id number] Yes Take 10 mg by mouth daily. [provider] Taking Active Spouse/Significant Other  losartan (COZAAR) 50 MG tablet [id number] Yes Take 1 tablet (50 mg total) by mouth daily. Ria Bush, MD Taking Active   lovastatin (MEVACOR) 20 MG tablet [id number] Yes Take 1 tablet (20 mg total) by mouth at bedtime. Ria Bush, MD Taking Active   Magnesium 500 MG TABS [id number] Yes Take 1 tablet (500 mg total) by mouth every Monday, Wednesday, and Friday. Ria Bush, MD Taking Active   metFORMIN (GLUCOPHAGE-XR) 500 MG 24 hr tablet [id number] Yes TAKE 1 TABLET BY MOUTH EVERY DAY WITH Sander Radon, MD Taking Active   Multiple Vitamins-Minerals ([facility] SPECTRAVITE PO) [id number] Yes Take 1 tablet by mouth at bedtime. [provider] Taking Active Spouse/Significant Other  mycophenolate (MYFORTIC) 360 MG  TBEC EC tablet [id number] Yes Take 360 mg by mouth 2 (two) times daily. [provider] Taking Active   Omega-3 Fatty Acids (FISH OIL PO) [id number] Yes Take 1 tablet by mouth at bedtime. [provider] Taking Active Spouse/Significant Other  omeprazole (PRILOSEC) 40 MG capsule [id number] Yes Take 1 capsule (40 mg total) by mouth daily. Ria Bush, MD Taking Active   oxyCODONE (ROXICODONE) 5 MG immediate release tablet [id number] Yes 1-2 tabs PO q6 hours prn pain Leanora Cover, MD Taking Active   tacrolimus (PROGRAF) 1 MG capsule [id number] Yes Take 1-2 mg by mouth See admin instructions. Take 2 mg by mouth in the morning, then take 1 mg  by mouth in the evening [provider] Taking Active Spouse/Significant Other            Patient Active Problem List   Diagnosis Date Noted   Diarrhea of presumed infectious origin 04/02/2021   Medicare annual wellness visit, subsequent 02/15/2021   Immunosuppressed status ([place]) 11/10/2020   Chronic right-sided thoracic back pain 04/07/2020   Memory loss 04/07/2020   Perianal pruritus 02/06/2020   Prostate cancer ([place]) 02/06/2020   Advanced directives, counseling/discussion 02/06/2020   Pain, dental 08/21/2019   Orthostatic hypotension 07/31/2019   Syncope 07/30/2019   Left rib fracture 07/27/2019   Pneumonia due to COVID-19 virus 07/18/2019   [place] lesion, left 02/05/2018   Acute sinusitis 10/23/2017   Chronic heel pain, left 10/14/2017   Anejaculation 08/09/2017   Chronic gout 05/22/2017   Dyspnea 11/18/2016   BPH (benign prostatic hyperplasia) 10/05/2016   Status post liver transplant ([place]) 03/15/2016   Portal hypertensive gastropathy ([place]) 12/04/2015   Type 2 diabetes mellitus with other specified complication ([place]) 38/18/2993   Gynecomastia    CKD (chronic kidney disease) stage 3, GFR 30-59 ml/min (HCC) 11/04/2015   Depression, major, single episode, moderate ([place]) 07/09/2015   Headache 02/19/2015    Insomnia 12/06/2014   Hypomagnesemia    Other fatigue 11/18/2014   Internal hemorrhoid 10/01/2014   Erectile dysfunction 71/69/6789   Alcoholic cirrhosis of liver without ascites ([place]) 07/28/2014   (HFpEF) heart failure with preserved ejection fraction ([place]) 11/01/2013   Essential hypertension 06/04/2013   OSA on CPAP 07/11/2012   History of alcohol dependence ([place]) 07/11/2012   Thrombocytopenia ([place]) 12/15/2011   Morbid obesity with BMI of 40.0-44.9, adult ([place]) 11/16/2011   Routine general medical examination at a health care facility 09/22/2010   NEUROPATHY 05/27/2009   Pedal edema 09/22/2008   Dyslipidemia associated with type 2 diabetes mellitus ([place]) 02/15/2007   Allergic rhinitis 02/15/2007   GERD 02/15/2007    Immunization History  Administered Date(s) Administered   Hep A / Hep B 01/16/2015, 01/23/2015, 02/06/2015   Hepatitis B, adult 12/04/2015   Influenza Whole 03/24/2010   Influenza,inj,Quad PF,6+ Mos 04/22/2015, 04/06/2017, 04/23/2018, 04/23/2019, 04/07/2020   PFIZER(Purple Top)SARS-COV-2 Vaccination 11/07/2019, 11/28/2019, 05/07/2020, 12/04/2020   PNEUMOCOCCAL CONJUGATE-20 02/15/2021   Pneumococcal Polysaccharide-23 04/22/2015   Td 05/01/2007   Tdap 12/14/2015, 10/05/2020    Conditions to be addressed/monitored:  Hyperlipidemia and Diabetes  Care Plan : [place]  Updates made by Debbora Dus, [place] since 05/10/2021 12:00 AM     Problem: CHL AMB "PATIENT-SPECIFIC PROBLEM"      Long-Range Goal: Disease Management   Start Date: 03/02/2021  This Visit's Progress: Not on track  Recent Progress: On track  Priority: High  Note:    Current Barriers:  Unable to achieve control of diabetes - improving   Pharmacist Clinical Goal(s):  Patient will contact provider office for questions/concerns as evidenced notation of same in electronic health record through collaboration with PharmD and provider.   Interventions: 1:1 collaboration with  Ria Bush, MD regarding development and update of comprehensive plan of  care as evidenced by provider attestation and co-signature Inter-disciplinary care team collaboration (see longitudinal plan of care) Comprehensive medication review performed; medication list updated in electronic medical record  Hyperlipidemia: (LDL goal < 100 -Did not discuss today due to stable/controlled -Controlled - refills timely, LDL 38 but TG elevated above goal secondary to DM. -Current treatment: Lovastatin 20 mg - 1 tablet daily at bedtime  -Medications previously tried: none  -Recommended to continue current medication  Diabetes (A1c goal <8%) - Improving - A1c improved from 11.5 to 8.6% 02/2021, Pt restarted Jardiance 02/03/21. He stopped Trulicity around 61/68/37 due to diarrhea. He has not checked BG since then to assess.  -Current medications: Trulicity 2.90SX/1.1 mL - Inject once weekly on <[id number]>_9<\/[id number]>_6  Trulicity                                    Not taking currently, pt stopped mailing from PAP  Jardiance                                 PAP Lantus                                      PAP  Patient's preferred pharmacy is:  [id number] [place]Phone: [phone]: [phone] [place]: [phone]: [phone] [id number] [place] [address] AT [place] [address] [place] [address] Phone: [phone] Fax: [phone]  Care Plan and Follow Up Patient Decision:  Patient agrees to Care Plan and Follow-up.  Debbora Dus, PharmD Clinical Pharmacist [facility] at [facility] [phone]

## 2021-05-10 NOTE — Patient Instructions (Signed)
Dear [name],  Below is a summary of the goals we discussed during our follow up appointment on May 10, 2021. Please contact me anytime with questions or concerns.   Visit Information  Patient Care Plan: CCM Pharmacy Care Plan     Problem Identified: CHL AMB "PATIENT-SPECIFIC PROBLEM"      Long-Range Goal: Disease Management   Start Date: 03/02/2021  This Visit's Progress: Not on track  Recent Progress: On track  Priority: High  Note:    Current Barriers:  Unable to achieve control of diabetes - improving   Pharmacist Clinical Goal(s):  Patient will contact provider office for questions/concerns as evidenced notation of same in electronic health record through collaboration with PharmD and provider.   Interventions: 1:1 collaboration with Ria Bush, MD regarding development and update of comprehensive plan of care as evidenced by provider attestation and co-signature Inter-disciplinary care team collaboration (see longitudinal plan of care) Comprehensive medication review performed; medication list updated in electronic medical record  Hyperlipidemia: (LDL goal < 100 -Did not discuss today due to stable/controlled -Controlled - refills timely, LDL 38 but TG elevated above goal secondary to DM. -Current treatment: Lovastatin 20 mg - 1 tablet daily at bedtime  -Medications previously tried: none  -Recommended to continue current medication  Diabetes (A1c goal <8%) - Improving - A1c improved from 11.5 to 8.6% 02/2021, Pt restarted Jardiance 02/03/21. He stopped Trulicity around 93/23/55 due to diarrhea. He has not checked BG since then to assess.  -Current medications: Trulicity 0.75mg /0.5 mL - Inject once weekly on Sundays (HOLD since 04/11/21) Jardiance 10mg  - Take 1 tablet daily  Lantus 100unit/mL - Injects 102 units daily at bedtime (each pen lasts 3 days) Metformin XR 500 mg - Take 1 tablet at breakfast -Medications previously tried: none   -Working  on diet, switched tea to sugar free options, sugar free jello, apple/day -Current home glucose readings  fasting glucose: none, has not checked his BG this month  post prandial glucose: n/a -Denies hypoglycemic/hyperglycemic symptoms -Recommend check home BG several days per week and send these to CCM team. Follow up 30 days.  Patient Goals/Self-Care Activities Patient will:  - take medications as prescribed  Follow Up Plan: Telephone follow up appointment with care management team member scheduled for:  30 days       Patient verbalizes understanding of instructions provided today and agrees to view in [place].   Debbora Dus, PharmD Clinical Pharmacist [facility] at [facility] [phone]

## 2021-05-11 ENCOUNTER — Ambulatory Visit: Admission: RE | Admit: 2021-05-11 | Payer: Medicare HMO | Source: Ambulatory Visit

## 2021-05-11 DIAGNOSIS — C61 Malignant neoplasm of prostate: Secondary | ICD-10-CM | POA: Diagnosis not present

## 2021-05-12 ENCOUNTER — Ambulatory Visit
Admission: RE | Admit: 2021-05-12 | Discharge: 2021-05-12 | Disposition: A | Discharge status: Home / Self Care | Payer: Medicare HMO | Source: Ambulatory Visit | Attending: Radiation Oncology | Admitting: Radiation Oncology

## 2021-05-12 DIAGNOSIS — Z510 Encounter for antineoplastic radiation therapy: Secondary | ICD-10-CM | POA: Diagnosis not present

## 2021-05-12 DIAGNOSIS — C61 Malignant neoplasm of prostate: Secondary | ICD-10-CM | POA: Diagnosis not present

## 2021-05-13 ENCOUNTER — Ambulatory Visit
Admission: RE | Admit: 2021-05-13 | Discharge: 2021-05-13 | Disposition: A | Discharge status: Home / Self Care | Payer: Medicare HMO | Source: Ambulatory Visit | Attending: Radiation Oncology | Admitting: Radiation Oncology

## 2021-05-13 DIAGNOSIS — Z510 Encounter for antineoplastic radiation therapy: Secondary | ICD-10-CM | POA: Diagnosis not present

## 2021-05-13 DIAGNOSIS — C61 Malignant neoplasm of prostate: Secondary | ICD-10-CM | POA: Diagnosis not present

## 2021-05-14 ENCOUNTER — Ambulatory Visit
Admission: RE | Admit: 2021-05-14 | Discharge: 2021-05-14 | Disposition: A | Discharge status: Home / Self Care | Payer: Medicare HMO | Source: Ambulatory Visit | Attending: Radiation Oncology | Admitting: Radiation Oncology

## 2021-05-14 DIAGNOSIS — Z510 Encounter for antineoplastic radiation therapy: Secondary | ICD-10-CM | POA: Diagnosis not present

## 2021-05-14 DIAGNOSIS — C61 Malignant neoplasm of prostate: Secondary | ICD-10-CM | POA: Diagnosis not present

## 2021-05-17 ENCOUNTER — Other Ambulatory Visit: Payer: Self-pay | Admitting: Family Medicine

## 2021-05-17 ENCOUNTER — Ambulatory Visit
Admission: RE | Admit: 2021-05-17 | Discharge: 2021-05-17 | Disposition: A | Discharge status: Home / Self Care | Payer: Medicare HMO | Source: Ambulatory Visit | Attending: Radiation Oncology | Admitting: Radiation Oncology

## 2021-05-17 DIAGNOSIS — C61 Malignant neoplasm of prostate: Secondary | ICD-10-CM | POA: Diagnosis not present

## 2021-05-17 DIAGNOSIS — Z510 Encounter for antineoplastic radiation therapy: Secondary | ICD-10-CM | POA: Diagnosis not present

## 2021-05-18 ENCOUNTER — Other Ambulatory Visit: Payer: Self-pay | Admitting: Family Medicine

## 2021-05-18 ENCOUNTER — Ambulatory Visit
Admission: RE | Admit: 2021-05-18 | Discharge: 2021-05-18 | Disposition: A | Discharge status: Home / Self Care | Payer: Medicare HMO | Source: Ambulatory Visit | Attending: Radiation Oncology | Admitting: Radiation Oncology

## 2021-05-18 DIAGNOSIS — C61 Malignant neoplasm of prostate: Secondary | ICD-10-CM | POA: Diagnosis not present

## 2021-05-18 DIAGNOSIS — Z510 Encounter for antineoplastic radiation therapy: Secondary | ICD-10-CM | POA: Diagnosis not present

## 2021-05-18 NOTE — Telephone Encounter (Signed)
Refill request Robaxin Last refill 04/30/21 #40 Last office visit video 04/02/21 Upcoming appointment 06/22/21

## 2021-05-19 ENCOUNTER — Ambulatory Visit
Admission: RE | Admit: 2021-05-19 | Discharge: 2021-05-19 | Disposition: A | Discharge status: Home / Self Care | Payer: Medicare HMO | Source: Ambulatory Visit | Attending: Radiation Oncology | Admitting: Radiation Oncology

## 2021-05-19 DIAGNOSIS — C61 Malignant neoplasm of prostate: Secondary | ICD-10-CM | POA: Diagnosis not present

## 2021-05-19 DIAGNOSIS — Z510 Encounter for antineoplastic radiation therapy: Secondary | ICD-10-CM | POA: Diagnosis not present

## 2021-05-19 MED ORDER — METHOCARBAMOL 500 MG PO TABS: 500.0000 mg | ORAL_TABLET | Freq: Three times a day (TID) | ORAL | 0 refills | Status: DC | PRN

## 2021-05-19 NOTE — Telephone Encounter (Signed)
Robaxin Last filled:  04/30/21, #40 Last OV:  04/02/21, diarrhea Next OV:  06/22/21, 4 mo DM f/u

## 2021-05-24 ENCOUNTER — Ambulatory Visit
Admission: RE | Admit: 2021-05-24 | Discharge: 2021-05-24 | Disposition: A | Discharge status: Home / Self Care | Payer: Medicare HMO | Source: Ambulatory Visit | Attending: Radiation Oncology | Admitting: Radiation Oncology

## 2021-05-24 DIAGNOSIS — C61 Malignant neoplasm of prostate: Secondary | ICD-10-CM | POA: Diagnosis not present

## 2021-05-24 DIAGNOSIS — Z510 Encounter for antineoplastic radiation therapy: Secondary | ICD-10-CM | POA: Diagnosis not present

## 2021-05-25 ENCOUNTER — Ambulatory Visit
Admission: RE | Admit: 2021-05-25 | Discharge: 2021-05-25 | Disposition: A | Discharge status: Home / Self Care | Payer: Medicare HMO | Source: Ambulatory Visit | Attending: Radiation Oncology | Admitting: Radiation Oncology

## 2021-05-25 DIAGNOSIS — Z510 Encounter for antineoplastic radiation therapy: Secondary | ICD-10-CM | POA: Diagnosis not present

## 2021-05-25 DIAGNOSIS — C61 Malignant neoplasm of prostate: Secondary | ICD-10-CM | POA: Diagnosis not present

## 2021-05-26 ENCOUNTER — Other Ambulatory Visit: Payer: Self-pay

## 2021-05-26 ENCOUNTER — Ambulatory Visit
Admission: RE | Admit: 2021-05-26 | Discharge: 2021-05-26 | Disposition: A | Discharge status: Home / Self Care | Payer: Medicare HMO | Source: Ambulatory Visit | Attending: Radiation Oncology | Admitting: Radiation Oncology

## 2021-05-26 ENCOUNTER — Inpatient Hospital Stay: Payer: Medicare HMO | Attending: Radiation Oncology

## 2021-05-26 DIAGNOSIS — Z794 Long term (current) use of insulin: Secondary | ICD-10-CM

## 2021-05-26 DIAGNOSIS — E1169 Type 2 diabetes mellitus with other specified complication: Secondary | ICD-10-CM

## 2021-05-26 DIAGNOSIS — C61 Malignant neoplasm of prostate: Secondary | ICD-10-CM | POA: Insufficient documentation

## 2021-05-26 DIAGNOSIS — Z510 Encounter for antineoplastic radiation therapy: Secondary | ICD-10-CM | POA: Diagnosis not present

## 2021-05-26 DIAGNOSIS — E785 Hyperlipidemia, unspecified: Secondary | ICD-10-CM | POA: Diagnosis not present

## 2021-05-26 LAB — CBC
HCT: 39.5 % (ref 39.0–52.0)
Hemoglobin: 13.1 g/dL (ref 13.0–17.0)
MCH: 27.7 pg (ref 26.0–34.0)
MCHC: 33.2 g/dL (ref 30.0–36.0)
MCV: 83.5 fL (ref 80.0–100.0)
Platelets: 162 10*3/uL (ref 150–400)
RBC: 4.73 MIL/uL (ref 4.22–5.81)
RDW: 13.8 % (ref 11.5–15.5)
WBC: 7 10*3/uL (ref 4.0–10.5)
nRBC: 0 % (ref 0.0–0.2)

## 2021-05-27 ENCOUNTER — Ambulatory Visit
Admission: RE | Admit: 2021-05-27 | Discharge: 2021-05-27 | Disposition: A | Discharge status: Home / Self Care | Payer: Medicare HMO | Source: Ambulatory Visit | Attending: Radiation Oncology | Admitting: Radiation Oncology

## 2021-05-27 DIAGNOSIS — C61 Malignant neoplasm of prostate: Secondary | ICD-10-CM | POA: Diagnosis not present

## 2021-05-27 DIAGNOSIS — Z510 Encounter for antineoplastic radiation therapy: Secondary | ICD-10-CM | POA: Insufficient documentation

## 2021-05-28 ENCOUNTER — Ambulatory Visit
Admission: RE | Admit: 2021-05-28 | Discharge: 2021-05-28 | Disposition: A | Discharge status: Home / Self Care | Payer: Medicare HMO | Source: Ambulatory Visit | Attending: Radiation Oncology | Admitting: Radiation Oncology

## 2021-05-28 DIAGNOSIS — Z79899 Other long term (current) drug therapy: Secondary | ICD-10-CM | POA: Diagnosis not present

## 2021-05-28 DIAGNOSIS — Z510 Encounter for antineoplastic radiation therapy: Secondary | ICD-10-CM | POA: Diagnosis not present

## 2021-05-28 DIAGNOSIS — Z48298 Encounter for aftercare following other organ transplant: Secondary | ICD-10-CM | POA: Diagnosis not present

## 2021-05-28 DIAGNOSIS — C61 Malignant neoplasm of prostate: Secondary | ICD-10-CM | POA: Diagnosis not present

## 2021-05-28 DIAGNOSIS — Z944 Liver transplant status: Secondary | ICD-10-CM | POA: Diagnosis not present

## 2021-05-31 ENCOUNTER — Ambulatory Visit
Admission: RE | Admit: 2021-05-31 | Discharge: 2021-05-31 | Disposition: A | Discharge status: Home / Self Care | Payer: Medicare HMO | Source: Ambulatory Visit | Attending: Radiation Oncology | Admitting: Radiation Oncology

## 2021-05-31 DIAGNOSIS — Z510 Encounter for antineoplastic radiation therapy: Secondary | ICD-10-CM | POA: Diagnosis not present

## 2021-05-31 DIAGNOSIS — C61 Malignant neoplasm of prostate: Secondary | ICD-10-CM | POA: Diagnosis not present

## 2021-06-01 ENCOUNTER — Ambulatory Visit
Admission: RE | Admit: 2021-06-01 | Discharge: 2021-06-01 | Disposition: A | Discharge status: Home / Self Care | Payer: Medicare HMO | Source: Ambulatory Visit | Attending: Radiation Oncology | Admitting: Radiation Oncology

## 2021-06-01 DIAGNOSIS — C61 Malignant neoplasm of prostate: Secondary | ICD-10-CM | POA: Diagnosis not present

## 2021-06-01 DIAGNOSIS — Z510 Encounter for antineoplastic radiation therapy: Secondary | ICD-10-CM | POA: Diagnosis not present

## 2021-06-02 ENCOUNTER — Ambulatory Visit
Admission: RE | Admit: 2021-06-02 | Discharge: 2021-06-02 | Disposition: A | Discharge status: Home / Self Care | Payer: Medicare HMO | Source: Ambulatory Visit | Attending: Radiation Oncology | Admitting: Radiation Oncology

## 2021-06-02 DIAGNOSIS — Z510 Encounter for antineoplastic radiation therapy: Secondary | ICD-10-CM | POA: Diagnosis not present

## 2021-06-02 DIAGNOSIS — C61 Malignant neoplasm of prostate: Secondary | ICD-10-CM | POA: Diagnosis not present

## 2021-06-03 ENCOUNTER — Ambulatory Visit
Admission: RE | Admit: 2021-06-03 | Discharge: 2021-06-03 | Disposition: A | Discharge status: Home / Self Care | Payer: Medicare HMO | Source: Ambulatory Visit | Attending: Radiation Oncology | Admitting: Radiation Oncology

## 2021-06-03 DIAGNOSIS — Z510 Encounter for antineoplastic radiation therapy: Secondary | ICD-10-CM | POA: Diagnosis not present

## 2021-06-03 DIAGNOSIS — C61 Malignant neoplasm of prostate: Secondary | ICD-10-CM | POA: Diagnosis not present

## 2021-06-04 ENCOUNTER — Ambulatory Visit
Admission: RE | Admit: 2021-06-04 | Discharge: 2021-06-04 | Disposition: A | Discharge status: Home / Self Care | Payer: Medicare HMO | Source: Ambulatory Visit | Attending: Radiation Oncology | Admitting: Radiation Oncology

## 2021-06-04 DIAGNOSIS — Z510 Encounter for antineoplastic radiation therapy: Secondary | ICD-10-CM | POA: Diagnosis not present

## 2021-06-04 DIAGNOSIS — C61 Malignant neoplasm of prostate: Secondary | ICD-10-CM | POA: Diagnosis not present

## 2021-06-07 ENCOUNTER — Ambulatory Visit
Admission: RE | Admit: 2021-06-07 | Discharge: 2021-06-07 | Disposition: A | Discharge status: Home / Self Care | Payer: Medicare HMO | Source: Ambulatory Visit | Attending: Radiation Oncology | Admitting: Radiation Oncology

## 2021-06-07 ENCOUNTER — Other Ambulatory Visit: Payer: Self-pay | Admitting: Family Medicine

## 2021-06-07 DIAGNOSIS — Z510 Encounter for antineoplastic radiation therapy: Secondary | ICD-10-CM | POA: Diagnosis not present

## 2021-06-07 DIAGNOSIS — C61 Malignant neoplasm of prostate: Secondary | ICD-10-CM | POA: Diagnosis not present

## 2021-06-08 ENCOUNTER — Ambulatory Visit
Admission: RE | Admit: 2021-06-08 | Discharge: 2021-06-08 | Disposition: A | Discharge status: Home / Self Care | Payer: Medicare HMO | Source: Ambulatory Visit | Attending: Radiation Oncology | Admitting: Radiation Oncology

## 2021-06-08 DIAGNOSIS — Z510 Encounter for antineoplastic radiation therapy: Secondary | ICD-10-CM | POA: Diagnosis not present

## 2021-06-08 DIAGNOSIS — C61 Malignant neoplasm of prostate: Secondary | ICD-10-CM | POA: Diagnosis not present

## 2021-06-09 ENCOUNTER — Ambulatory Visit
Admission: RE | Admit: 2021-06-09 | Discharge: 2021-06-09 | Disposition: A | Discharge status: Home / Self Care | Payer: Medicare HMO | Source: Ambulatory Visit | Attending: Radiation Oncology | Admitting: Radiation Oncology

## 2021-06-09 ENCOUNTER — Inpatient Hospital Stay: Payer: Medicare HMO | Attending: Radiation Oncology

## 2021-06-09 DIAGNOSIS — C61 Malignant neoplasm of prostate: Secondary | ICD-10-CM | POA: Diagnosis not present

## 2021-06-09 DIAGNOSIS — Z510 Encounter for antineoplastic radiation therapy: Secondary | ICD-10-CM | POA: Diagnosis not present

## 2021-06-10 ENCOUNTER — Other Ambulatory Visit: Payer: Self-pay | Admitting: Family Medicine

## 2021-06-10 ENCOUNTER — Ambulatory Visit
Admission: RE | Admit: 2021-06-10 | Discharge: 2021-06-10 | Disposition: A | Discharge status: Home / Self Care | Payer: Medicare HMO | Source: Ambulatory Visit | Attending: Radiation Oncology | Admitting: Radiation Oncology

## 2021-06-10 DIAGNOSIS — C61 Malignant neoplasm of prostate: Secondary | ICD-10-CM | POA: Diagnosis not present

## 2021-06-10 DIAGNOSIS — Z510 Encounter for antineoplastic radiation therapy: Secondary | ICD-10-CM | POA: Diagnosis not present

## 2021-06-11 ENCOUNTER — Ambulatory Visit
Admission: RE | Admit: 2021-06-11 | Discharge: 2021-06-11 | Disposition: A | Discharge status: Home / Self Care | Payer: Medicare HMO | Source: Ambulatory Visit | Attending: Radiation Oncology | Admitting: Radiation Oncology

## 2021-06-11 ENCOUNTER — Telehealth: Payer: Self-pay

## 2021-06-11 DIAGNOSIS — C61 Malignant neoplasm of prostate: Secondary | ICD-10-CM | POA: Diagnosis not present

## 2021-06-11 DIAGNOSIS — Z510 Encounter for antineoplastic radiation therapy: Secondary | ICD-10-CM | POA: Diagnosis not present

## 2021-06-11 NOTE — Progress Notes (Addendum)
Chronic Care Management Pharmacy Assistant   Name: Colton Porter  MRN: [id number] DOB: Jun 06, 1966  Reason for Encounter: CCM (Diabetes Disease State)   Recent office visits:  None since last CCM contact  Recent consult visits:  Radiation Oncology - IMRT Treatment. Patient presented for IMRT Treatment 21 visits from 05/12/2021 - 06/11/2021.  Hospital visits:  None in previous 6 months  Medications: Outpatient Encounter Medications as of 06/11/2021  Medication Sig   Accu-Chek FastClix Lancets MISC USE THREE TIMES DAILY TO CHECK SUGARS E65.11, INSULIN USE   ACCU-CHEK GUIDE test strip USE AS INSTRUCTED TO CHECK SUGARS THREE TIMES DAILY E11.65, INSULIN USE   acetaminophen (TYLENOL) 500 MG tablet Take 500 mg by mouth every 6 (six) hours as needed for mild pain.   allopurinol (ZYLOPRIM) 300 MG tablet TAKE 1 TABLET EVERY DAY   ASPIR-LOW 81 MG EC tablet Take 1 tablet (81 mg total) by mouth daily. (Patient not taking: Reported on 04/28/2021)   B-D UF III MINI PEN NEEDLES 31G X 5 MM MISC USE TO INJECT INSULIN DAILY   Blood Glucose Monitoring Suppl (ACCU-CHEK GUIDE) w/Device KIT 1 Units by Does not apply route as directed.   Cholecalciferol (VITAMIN D3) 2000 units TABS Take 1 tablet by mouth at bedtime.   docusate sodium (COLACE) 100 MG capsule Take 100 mg by mouth daily as needed for mild constipation.    empagliflozin (JARDIANCE) 10 MG TABS tablet Take 1 tablet (10 mg total) by mouth daily before breakfast.   escitalopram (LEXAPRO) 10 MG tablet TAKE 1 TABLET EVERY DAY   fluticasone (FLONASE) 50 MCG/ACT nasal spray SPRAY 2 SPRAYS INTO EACH NOSTRIL EVERY DAY   furosemide (LASIX) 40 MG tablet Take 1 tablet (40 mg total) by mouth daily.   glucose blood (ACCU-CHEK GUIDE) test strip Check sugars three times daily and as needed when feeling ill E11.65, insulin use   insulin glargine (LANTUS SOLOSTAR) 100 UNIT/ML Solostar Pen Inject 100 Units into the skin every morning. And pen needles 1/day    loratadine (CLARITIN) 10 MG tablet Take 10 mg by mouth daily.   losartan (COZAAR) 50 MG tablet TAKE 1 TABLET EVERY DAY   lovastatin (MEVACOR) 20 MG tablet TAKE 1 TABLET (20 MG TOTAL) BY MOUTH AT BEDTIME.   Magnesium 500 MG TABS Take 1 tablet (500 mg total) by mouth every Monday, Wednesday, and Friday.   metFORMIN (GLUCOPHAGE-XR) 500 MG 24 hr tablet TAKE 1 TABLET BY MOUTH EVERY DAY WITH BREAKFAST   methocarbamol (ROBAXIN) 500 MG tablet Take 1 tablet (500 mg total) by mouth 3 (three) times daily as needed for muscle spasms (sedation precautions).   methocarbamol (ROBAXIN) 500 MG tablet TAKE 1 TABLET BY MOUTH 3 TIMES DAILY AS NEEDED FOR MUSCLE SPASMS (SEDATION PRECAUTIONS).   Multiple Vitamins-Minerals (CVS SPECTRAVITE PO) Take 1 tablet by mouth at bedtime.   mycophenolate (MYFORTIC) 360 MG TBEC EC tablet Take 360 mg by mouth 2 (two) times daily.   Omega-3 Fatty Acids (FISH OIL PO) Take 1 tablet by mouth at bedtime.   omeprazole (PRILOSEC) 40 MG capsule Take 1 capsule (40 mg total) by mouth daily.   oxyCODONE (ROXICODONE) 5 MG immediate release tablet 1-2 tabs PO q6 hours prn pain   tacrolimus (PROGRAF) 1 MG capsule Take 1-2 mg by mouth See admin instructions. Take 2 mg by mouth in the morning, then take 1 mg  by mouth in the evening   No facility-administered encounter medications on file as of 06/11/2021.  Relevant Labs: °Lab Results  °Component Value Date/Time  ° HGBA1C 8.6 (A) 03/22/2021 08:40 AM  ° HGBA1C 11.5 (H) 02/08/2021 08:02 AM  ° HGBA1C 9.2 (A) 09/28/2020 08:45 AM  ° HGBA1C 10.6 (H) 01/22/2020 07:28 AM  ° MICROALBUR <0.7 08/27/2014 09:51 AM  °  °Kidney Function °Lab Results  °Component Value Date/Time  ° CREATININE 1.31 (H) 04/02/2021 04:45 PM  ° CREATININE 1.20 02/08/2021 08:02 AM  ° CREATININE 1.30 (H) 10/05/2020 07:56 PM  ° CREATININE 2.06 (H) 06/14/2016 08:56 AM  ° CREATININE 0.9 08/02/2012 08:39 AM  ° GFR 68.61 02/08/2021 08:02 AM  ° GFRNONAA >60 08/02/2019 01:09 AM  ° GFRAA >60  08/02/2019 01:09 AM  ° °Contacted patient on 06/11/2021 to discuss diabetes disease state.  ° °Current antihyperglycemic regimen:  °Trulicity 0.75mg/0.5 mL - Inject once weekly on Sundays (HOLD since 04/11/21) °Jardiance 10mg - Take 1 tablet daily  °Lantus 100unit/mL - Injects 102 units daily at bedtime (each pen lasts 3 days) °Metformin XR 500 mg - Take 1 tablet at breakfast °  Patient verbally confirms he is taking the above medications as directed. Yes ° °What diet changes have been made to improve diabetes control? Patient states he is doing his thing. No diet changes.  ° °What recent interventions/DTPs have been made to improve glycemic control:  °Recommend to check BG several days per week at home.  ° °Have there been any recent hospitalizations or ED visits since last visit with CPP? No ° °Patient denies hypoglycemic symptoms, including Pale, Sweaty, Shaky, Hungry, Nervous/irritable, and Vision changes ° °Patient denies hyperglycemic symptoms, including blurry vision, excessive thirst, fatigue, polyuria, and weakness ° °How often are you checking your blood sugar? Patient stated he has not taken his blood sugar since his last phone call with Michelle Adams. I asked patient to take his blood sugar readings (in the morning) starting tomorrow. Advised patient I would call him back on 12/22 for his readings. Patient understood and agreed.  ° °Are you checking your feet daily/regularly? Yes ° °Adherence Review: °Is the patient currently on a STATIN medication? Yes °Is the patient currently on ACE/ARB medication? Yes °Does the patient have >5 day gap between last estimated fill dates? No ° °Care Gaps: °Annual wellness visit in last year? Yes 03/02/2021 °Most recent A1C reading: 8.6 on 03/22/2021 °Most Recent BP reading: 131/82 ° °Last eye exam / retinopathy screening: 03/20/2020 °Last diabetic foot exam: Up to date ° °Counseled patient on importance of annual eye and foot exam.  ° °Star Rating Drugs:   °Medication:   Last Fill: Day Supply °Metformin XR 500mg     04/10/2021 90 °Losartan 50mg                        03/15/2021 90 °Lovastatin 20mg                      04/29/2021 90  °Jardiance                                 PAP °Lantus                                      PAP ° °PCP appointment on 06/22/2021 ° °Michelle Adams, CPP notified ° °Amy Moss, RMA °Clinical Pharmacy Assistant °[phone] ° °I have reviewed   reviewed the care management and care coordination activities outlined in this encounter and I am certifying that I agree with the content of this note. Pt did send me his BG on 05/11/21 - its was 229 (fasting). Will await more readings.  Debbora Dus, PharmD Clinical Pharmacist [facility] at [facility] [phone]

## 2021-06-14 ENCOUNTER — Ambulatory Visit
Admission: RE | Admit: 2021-06-14 | Discharge: 2021-06-14 | Disposition: A | Discharge status: Home / Self Care | Payer: Medicare HMO | Source: Ambulatory Visit | Attending: Radiation Oncology | Admitting: Radiation Oncology

## 2021-06-14 DIAGNOSIS — Z510 Encounter for antineoplastic radiation therapy: Secondary | ICD-10-CM | POA: Diagnosis not present

## 2021-06-14 DIAGNOSIS — C61 Malignant neoplasm of prostate: Secondary | ICD-10-CM | POA: Diagnosis not present

## 2021-06-15 ENCOUNTER — Ambulatory Visit
Admission: RE | Admit: 2021-06-15 | Discharge: 2021-06-15 | Disposition: A | Discharge status: Home / Self Care | Payer: Medicare HMO | Source: Ambulatory Visit | Attending: Radiation Oncology | Admitting: Radiation Oncology

## 2021-06-15 DIAGNOSIS — C61 Malignant neoplasm of prostate: Secondary | ICD-10-CM | POA: Diagnosis not present

## 2021-06-15 DIAGNOSIS — Z510 Encounter for antineoplastic radiation therapy: Secondary | ICD-10-CM | POA: Diagnosis not present

## 2021-06-16 ENCOUNTER — Ambulatory Visit
Admission: RE | Admit: 2021-06-16 | Discharge: 2021-06-16 | Disposition: A | Discharge status: Home / Self Care | Payer: Medicare HMO | Source: Ambulatory Visit | Attending: Radiation Oncology | Admitting: Radiation Oncology

## 2021-06-16 DIAGNOSIS — C61 Malignant neoplasm of prostate: Secondary | ICD-10-CM | POA: Diagnosis not present

## 2021-06-16 DIAGNOSIS — Z510 Encounter for antineoplastic radiation therapy: Secondary | ICD-10-CM | POA: Diagnosis not present

## 2021-06-17 ENCOUNTER — Ambulatory Visit
Admission: RE | Admit: 2021-06-17 | Discharge: 2021-06-17 | Disposition: A | Discharge status: Home / Self Care | Payer: Medicare HMO | Source: Ambulatory Visit | Attending: Radiation Oncology | Admitting: Radiation Oncology

## 2021-06-17 DIAGNOSIS — C61 Malignant neoplasm of prostate: Secondary | ICD-10-CM | POA: Diagnosis not present

## 2021-06-17 DIAGNOSIS — Z510 Encounter for antineoplastic radiation therapy: Secondary | ICD-10-CM | POA: Diagnosis not present

## 2021-06-17 NOTE — Progress Notes (Signed)
Called patient for his blood sugar readings as previous discussed on 06/11/2021.   Blood sugar readings are as follows:  12/21 - 160 (Fasting) 12/20 - 404 at 8:00 am (Patient states he drank a Fanta drink around 4:00 am) -  checked it again 5 min later it was 303 12/19 - 198 (Fasting)  Debbora Dus, CPP notified  [name], [place] [phone]  Time Spent:  6 Minutes

## 2021-06-18 ENCOUNTER — Ambulatory Visit
Admission: RE | Admit: 2021-06-18 | Discharge: 2021-06-18 | Disposition: A | Discharge status: Home / Self Care | Payer: Medicare HMO | Source: Ambulatory Visit | Attending: Radiation Oncology | Admitting: Radiation Oncology

## 2021-06-18 ENCOUNTER — Ambulatory Visit: Payer: Medicare HMO | Admitting: Family Medicine

## 2021-06-18 DIAGNOSIS — R0981 Nasal congestion: Secondary | ICD-10-CM | POA: Diagnosis not present

## 2021-06-18 DIAGNOSIS — Z23 Encounter for immunization: Secondary | ICD-10-CM | POA: Diagnosis not present

## 2021-06-18 DIAGNOSIS — C61 Malignant neoplasm of prostate: Secondary | ICD-10-CM | POA: Diagnosis not present

## 2021-06-18 DIAGNOSIS — N1831 Chronic kidney disease, stage 3a: Secondary | ICD-10-CM | POA: Diagnosis not present

## 2021-06-18 DIAGNOSIS — G8929 Other chronic pain: Secondary | ICD-10-CM | POA: Diagnosis not present

## 2021-06-18 DIAGNOSIS — R109 Unspecified abdominal pain: Secondary | ICD-10-CM | POA: Diagnosis not present

## 2021-06-18 DIAGNOSIS — Z794 Long term (current) use of insulin: Secondary | ICD-10-CM | POA: Diagnosis not present

## 2021-06-18 DIAGNOSIS — M546 Pain in thoracic spine: Secondary | ICD-10-CM | POA: Diagnosis not present

## 2021-06-18 DIAGNOSIS — Z510 Encounter for antineoplastic radiation therapy: Secondary | ICD-10-CM | POA: Diagnosis not present

## 2021-06-18 DIAGNOSIS — E1169 Type 2 diabetes mellitus with other specified complication: Secondary | ICD-10-CM | POA: Diagnosis not present

## 2021-06-22 ENCOUNTER — Other Ambulatory Visit: Payer: Self-pay

## 2021-06-22 ENCOUNTER — Encounter: Payer: Self-pay | Admitting: Family Medicine

## 2021-06-22 ENCOUNTER — Ambulatory Visit (INDEPENDENT_AMBULATORY_CARE_PROVIDER_SITE_OTHER): Payer: Medicare HMO | Admitting: Family Medicine

## 2021-06-22 ENCOUNTER — Ambulatory Visit
Admission: RE | Admit: 2021-06-22 | Discharge: 2021-06-22 | Disposition: A | Discharge status: Home / Self Care | Payer: Medicare HMO | Source: Ambulatory Visit | Attending: Radiation Oncology | Admitting: Radiation Oncology

## 2021-06-22 VITALS — BP 140/68 | HR 86 | Temp 97.5°F | Ht 66.5 in | Wt 260.2 lb

## 2021-06-22 DIAGNOSIS — E785 Hyperlipidemia, unspecified: Secondary | ICD-10-CM

## 2021-06-22 DIAGNOSIS — Z794 Long term (current) use of insulin: Secondary | ICD-10-CM

## 2021-06-22 DIAGNOSIS — Z23 Encounter for immunization: Secondary | ICD-10-CM | POA: Diagnosis not present

## 2021-06-22 DIAGNOSIS — G8929 Other chronic pain: Secondary | ICD-10-CM | POA: Diagnosis not present

## 2021-06-22 DIAGNOSIS — R109 Unspecified abdominal pain: Secondary | ICD-10-CM | POA: Diagnosis not present

## 2021-06-22 DIAGNOSIS — N1831 Chronic kidney disease, stage 3a: Secondary | ICD-10-CM

## 2021-06-22 DIAGNOSIS — R0981 Nasal congestion: Secondary | ICD-10-CM | POA: Diagnosis not present

## 2021-06-22 DIAGNOSIS — E1169 Type 2 diabetes mellitus with other specified complication: Secondary | ICD-10-CM | POA: Diagnosis not present

## 2021-06-22 DIAGNOSIS — M546 Pain in thoracic spine: Secondary | ICD-10-CM | POA: Diagnosis not present

## 2021-06-22 DIAGNOSIS — C61 Malignant neoplasm of prostate: Secondary | ICD-10-CM | POA: Diagnosis not present

## 2021-06-22 DIAGNOSIS — Z510 Encounter for antineoplastic radiation therapy: Secondary | ICD-10-CM | POA: Diagnosis not present

## 2021-06-22 LAB — POC URINALSYSI DIPSTICK (AUTOMATED)
Bilirubin, UA: NEGATIVE
Blood, UA: NEGATIVE
Glucose, UA: POSITIVE — AB
Ketones, UA: NEGATIVE
Leukocytes, UA: NEGATIVE
Nitrite, UA: NEGATIVE
Protein, UA: NEGATIVE
Spec Grav, UA: 1.02 (ref 1.010–1.025)
Urobilinogen, UA: 0.2 E.U./dL
pH, UA: 6 (ref 5.0–8.0)

## 2021-06-22 LAB — POCT GLYCOSYLATED HEMOGLOBIN (HGB A1C): Hemoglobin A1C: 9.3 % — AB (ref 4.0–5.6)

## 2021-06-22 MED ORDER — EMPAGLIFLOZIN 25 MG PO TABS: 25.0000 mg | ORAL_TABLET | Freq: Every day | ORAL | 3 refills | Status: DC

## 2021-06-22 MED ORDER — METHOCARBAMOL 500 MG PO TABS: 500.0000 mg | ORAL_TABLET | Freq: Three times a day (TID) | ORAL | 0 refills | Status: DC | PRN

## 2021-06-22 NOTE — Assessment & Plan Note (Addendum)
Chronic, deteriorated.  Released from endo care.  Hesitant to increase Trulicity dose given concern that it contributed to diarrhea and nausea. Will increase jardiance to 25mg  daily. I did ask him to return in 2 wks for labs on higher jardiance dose. RTC 3-4 mo DM f/u visit.

## 2021-06-22 NOTE — Patient Instructions (Addendum)
Flu shot today Schedule eye exam as you're due (last note I have is from 02/2020).  Urinalysis today  Increase jardiance to 25mg  daily. May take 2 10mg  until you run out, new dose will be at pharmacy.  Schedule lab visit in 2 weeks after increasing jardiance. Back off night time snacking (carbs).  Return in 3-4 months for follow up visit.

## 2021-06-22 NOTE — Progress Notes (Signed)
Patient ID: Colton Porter, male    DOB: 07/23/1965, 55 y.o.   MRN: [id number]  This visit was conducted in person.  BP 140/68    Pulse 86    Temp (!) 97.5 F (36.4 C) (Temporal)    Ht 5' 6.5" (1.689 m)    Wt 260 lb 4 oz (118 kg)    SpO2 95%    BMI 41.38 kg/m    CC: DM f/u visit  Subjective:   HPI: Colton Porter is a 55 y.o. male presenting on 06/22/2021 for Diabetes (Here for 4 mo f/u.)   Known type 2 diabetic, chronic HFpEF, CKD stage 3 s/p liver transplant for alcoholic cirrhosis on myfortic and prograf now recent prostate cancer diagnosis.  1d h/o sinus congestion, hoarse voice. No fevers, ST, cough.   Gleason 3+3 and Gleason 3+4 R>L prostate cancer diagnosed 02/2021 undergoing Eligard 65mo depo and radiation therapy (external beam image guided IMRT). Undergoing 40 treatments, has 14 left. Notes increased frequency with urination, bowels overall normal.   HLD - continues lovastatin 20mg  daily.   Ongoing R flank pain - robaxin helps.   DM - does not regularly check sugars - fluctuating levels, recently 400 after a fanta, otherwise 150-160. Only 2 meals a day, junk food at night - chips and cheese-its. Has been eating more salted peanuts. Previously saw endocrinology (Dr Loanne Drilling) - released from care 02/2021 with plans for PCP/[facility] pharmacy team to continue management. Compliant with antihyperglycemic regimen which includes: lantus 100u daily, metformin XR 500mg  daily, jardiance 10mg  daily. Thinks trulicity caused diarrhea. Denies low sugars or hypoglycemic symptoms. Denies blurry vision. Notes foot paresthesias and burning pain. Last diabetic eye exam 02/2020 - DUE. Glucometer brand: accu-chek. Last foot exam: 02/2021. DSME: completed 2019. Lab Results  Component Value Date   HGBA1C 9.3 (A) 06/22/2021   Diabetic Foot Exam - Simple   No data filed    Lab Results  Component Value Date   MICROALBUR <0.7 08/27/2014         Relevant past medical, surgical, family and social  history reviewed and updated as indicated. Interim medical history since our last visit reviewed. Allergies and medications reviewed and updated. Outpatient Medications Prior to Visit  Medication Sig Dispense Refill   Accu-Chek FastClix Lancets MISC USE THREE TIMES DAILY TO CHECK SUGARS E65.11, INSULIN USE 102 each 12   ACCU-CHEK GUIDE test strip USE AS INSTRUCTED TO CHECK SUGARS THREE TIMES DAILY E11.65, INSULIN USE 100 strip 12   acetaminophen (TYLENOL) 500 MG tablet Take 500 mg by mouth every 6 (six) hours as needed for mild pain.     allopurinol (ZYLOPRIM) 300 MG tablet TAKE 1 TABLET EVERY DAY 90 tablet 0   ASPIR-LOW 81 MG EC tablet Take 1 tablet (81 mg total) by mouth daily. 90 tablet 3   B-D UF III MINI PEN NEEDLES 31G X 5 MM MISC USE TO INJECT INSULIN DAILY 100 each 3   Blood Glucose Monitoring Suppl (ACCU-CHEK GUIDE) w/Device KIT 1 Units by Does not apply route as directed. 1 kit 0   Cholecalciferol (VITAMIN D3) 2000 units TABS Take 1 tablet by mouth at bedtime.     docusate sodium (COLACE) 100 MG capsule Take 100 mg by mouth daily as needed for mild constipation.      escitalopram (LEXAPRO) 10 MG tablet TAKE 1 TABLET EVERY DAY 90 tablet 0   fluticasone (FLONASE) 50 MCG/ACT nasal spray SPRAY 2 SPRAYS INTO EACH NOSTRIL EVERY DAY 48 mL  0   furosemide (LASIX) 40 MG tablet Take 1 tablet (40 mg total) by mouth daily. 90 tablet 3   glucose blood (ACCU-CHEK GUIDE) test strip Check sugars three times daily and as needed when feeling ill E11.65, insulin use 100 each 11   insulin glargine (LANTUS SOLOSTAR) 100 UNIT/ML Solostar Pen Inject 100 Units into the skin every morning. And pen needles 1/day 105 mL 3   loratadine (CLARITIN) 10 MG tablet Take 10 mg by mouth daily.     losartan (COZAAR) 50 MG tablet TAKE 1 TABLET EVERY DAY 90 tablet 2   lovastatin (MEVACOR) 20 MG tablet TAKE 1 TABLET (20 MG TOTAL) BY MOUTH AT BEDTIME. 90 tablet 2   Magnesium 500 MG TABS Take 1 tablet (500 mg total) by mouth  every Monday, Wednesday, and Friday. 30 tablet    metFORMIN (GLUCOPHAGE-XR) 500 MG 24 hr tablet TAKE 1 TABLET BY MOUTH EVERY DAY WITH BREAKFAST 90 tablet 3   Multiple Vitamins-Minerals (CVS SPECTRAVITE PO) Take 1 tablet by mouth at bedtime.     mycophenolate (MYFORTIC) 360 MG TBEC EC tablet Take 360 mg by mouth 2 (two) times daily.     Omega-3 Fatty Acids (FISH OIL PO) Take 1 tablet by mouth at bedtime.     omeprazole (PRILOSEC) 40 MG capsule Take 1 capsule (40 mg total) by mouth daily. 90 capsule 3   oxyCODONE (ROXICODONE) 5 MG immediate release tablet 1-2 tabs PO q6 hours prn pain 20 tablet 0   tacrolimus (PROGRAF) 1 MG capsule Take 1-2 mg by mouth See admin instructions. Take 2 mg by mouth in the morning, then take 1 mg  by mouth in the evening     empagliflozin (JARDIANCE) 10 MG TABS tablet Take 1 tablet (10 mg total) by mouth daily before breakfast. 90 tablet 1   methocarbamol (ROBAXIN) 500 MG tablet Take 1 tablet (500 mg total) by mouth 3 (three) times daily as needed for muscle spasms (sedation precautions). 40 tablet 0   methocarbamol (ROBAXIN) 500 MG tablet TAKE 1 TABLET BY MOUTH 3 TIMES DAILY AS NEEDED FOR MUSCLE SPASMS (SEDATION PRECAUTIONS). 40 tablet 0   No facility-administered medications prior to visit.     Per HPI unless specifically indicated in ROS section below Review of Systems  Objective:  BP 140/68    Pulse 86    Temp (!) 97.5 F (36.4 C) (Temporal)    Ht 5' 6.5" (1.689 m)    Wt 260 lb 4 oz (118 kg)    SpO2 95%    BMI 41.38 kg/m   Wt Readings from Last 3 Encounters:  06/22/21 260 lb 4 oz (118 kg)  04/28/21 256 lb (116.1 kg)  04/13/21 258 lb (117 kg)      Physical Exam Vitals and nursing note reviewed.  Constitutional:      Appearance: Normal appearance. He is not ill-appearing.  HENT:     Head: Normocephalic and atraumatic.     Nose: Nose normal. No congestion or rhinorrhea.     Right Turbinates: Not enlarged, swollen or pale.     Left Turbinates: Not  enlarged, swollen or pale.     Mouth/Throat:     Mouth: Mucous membranes are moist.     Pharynx: Oropharynx is clear. No oropharyngeal exudate or posterior oropharyngeal erythema.  Eyes:     Extraocular Movements: Extraocular movements intact.     Conjunctiva/sclera: Conjunctivae normal.     Pupils: Pupils are equal, round, and reactive to light.  Cardiovascular:  Rate and Rhythm: Normal rate and regular rhythm.     Pulses: Normal pulses.     Heart sounds: Normal heart sounds. No murmur heard. Pulmonary:     Effort: Pulmonary effort is normal. No respiratory distress.     Breath sounds: Normal breath sounds. No wheezing, rhonchi or rales.  Musculoskeletal:     Right lower leg: No edema.     Left lower leg: No edema.     Comments:  See HPI for foot exam if done Discomfort to palpation at R flank, but no CVA tenderness  Lymphadenopathy:     Cervical: No cervical adenopathy.  Skin:    General: Skin is warm and dry.     Findings: No rash.  Neurological:     Mental Status: He is alert.  Psychiatric:        Mood and Affect: Mood normal.        Behavior: Behavior normal.      Results for orders placed or performed in visit on 06/22/21  POCT glycosylated hemoglobin (Hb A1C)  Result Value Ref Range   Hemoglobin A1C 9.3 (A) 4.0 - 5.6 %   HbA1c POC (<> result, manual entry)     HbA1c, POC (prediabetic range)     HbA1c, POC (controlled diabetic range)    POCT Urinalysis Dipstick (Automated)  Result Value Ref Range   Color, UA yellow    Clarity, UA clear    Glucose, UA Positive (A) Negative   Bilirubin, UA negative    Ketones, UA negative    Spec Grav, UA 1.020 1.010 - 1.025   Blood, UA negative    pH, UA 6.0 5.0 - 8.0   Protein, UA Negative Negative   Urobilinogen, UA 0.2 0.2 or 1.0 E.U./dL   Nitrite, UA negative    Leukocytes, UA Negative Negative   Lab Results  Component Value Date   CREATININE 1.31 (H) 04/02/2021   BUN 14 04/02/2021   NA 140 04/02/2021   K 4.1  04/02/2021   CL 104 04/02/2021   CO2 26 04/02/2021   Assessment & Plan:  This visit occurred during the SARS-CoV-2 public health emergency.  Safety protocols were in place, including screening questions prior to the visit, additional usage of staff PPE, and extensive cleaning of exam room while observing appropriate contact time as indicated for disinfecting solutions.   Problem List Items Addressed This Visit     Dyslipidemia associated with type 2 diabetes mellitus ([place]) (Chronic)    Continue lovastatin.       Relevant Medications   empagliflozin (JARDIANCE) 25 MG TABS tablet   Type 2 diabetes mellitus with other specified complication (HCC) - Primary (Chronic)    Chronic, deteriorated.  Released from endo care.  Hesitant to increase Trulicity dose given concern that it contributed to diarrhea and nausea. Will increase jardiance to $RemoveBefo'25mg'ynWjWanuFbJ$  daily. I did ask him to return in 2 wks for labs on higher jardiance dose. RTC 3-4 mo DM f/u visit.      Relevant Medications   empagliflozin (JARDIANCE) 25 MG TABS tablet   Other Relevant Orders   POCT glycosylated hemoglobin (Hb A1C) (Completed)   Renal function panel   CKD (chronic kidney disease) stage 3, GFR 30-59 ml/min (HCC)    Latest GFR actually >60. Continue to monitor.       Relevant Orders   Renal function panel   Prostate cancer ([place])    Appreciate uro and radiation oncology care. Undergoing 40 external beam radiation treatment.s  Chronic right-sided thoracic back pain    Ongoing. Discussed robaxin use - refilled.  Check UA r/o hematuria component.       Relevant Medications   methocarbamol (ROBAXIN) 500 MG tablet   Sinus congestion    Anticipate viral process - supportive measures reviewed, update if ongoing or worsening symptoms.       Other Visit Diagnoses     Need for influenza vaccination       Relevant Orders   Flu Vaccine QUAD 72mo+IM (Fluarix, Fluzone & Alfiuria Quad PF) (Completed)   Flank pain        Relevant Orders   POCT Urinalysis Dipstick (Automated) (Completed)        Meds ordered this encounter  Medications   empagliflozin (JARDIANCE) 25 MG TABS tablet    Sig: Take 1 tablet (25 mg total) by mouth daily before breakfast.    Dispense:  90 tablet    Refill:  3   methocarbamol (ROBAXIN) 500 MG tablet    Sig: Take 1 tablet (500 mg total) by mouth 3 (three) times daily as needed for muscle spasms (sedation precautions).    Dispense:  40 tablet    Refill:  0   Orders Placed This Encounter  Procedures   Flu Vaccine QUAD 39mo+IM (Fluarix, Fluzone & Alfiuria Quad PF)   Renal function panel    Standing Status:   Future    Standing Expiration Date:   06/22/2022   POCT glycosylated hemoglobin (Hb A1C)   POCT Urinalysis Dipstick (Automated)     Patient Instructions  Flu shot today Schedule eye exam as you're due (last note I have is from 02/2020).  Urinalysis today  Increase jardiance to $RemoveBefo'25mg'wbcUQanIICf$  daily. May take 2 $Remo'10mg'FNNBN$  until you run out, new dose will be at pharmacy.  Schedule lab visit in 2 weeks after increasing jardiance. Back off night time snacking (carbs).  Return in 3-4 months for follow up visit.   Follow up plan: Return in about 4 months (around 10/21/2021) for follow up visit.  Ria Bush, MD

## 2021-06-22 NOTE — Assessment & Plan Note (Signed)
Anticipate viral process - supportive measures reviewed, update if ongoing or worsening symptoms.

## 2021-06-22 NOTE — Assessment & Plan Note (Addendum)
Latest GFR actually >60. Continue to monitor.

## 2021-06-22 NOTE — Assessment & Plan Note (Signed)
Continue lovastatin 

## 2021-06-22 NOTE — Assessment & Plan Note (Signed)
Ongoing. Discussed robaxin use - refilled.  Check UA r/o hematuria component.

## 2021-06-22 NOTE — Assessment & Plan Note (Signed)
Appreciate uro and radiation oncology care. Undergoing 40 external beam radiation treatment.s

## 2021-06-23 ENCOUNTER — Inpatient Hospital Stay: Payer: Medicare HMO

## 2021-06-23 ENCOUNTER — Ambulatory Visit
Admission: RE | Admit: 2021-06-23 | Discharge: 2021-06-23 | Disposition: A | Discharge status: Home / Self Care | Payer: Medicare HMO | Source: Ambulatory Visit | Attending: Radiation Oncology | Admitting: Radiation Oncology

## 2021-06-23 DIAGNOSIS — Z510 Encounter for antineoplastic radiation therapy: Secondary | ICD-10-CM | POA: Diagnosis not present

## 2021-06-23 DIAGNOSIS — C61 Malignant neoplasm of prostate: Secondary | ICD-10-CM | POA: Diagnosis not present

## 2021-06-24 ENCOUNTER — Ambulatory Visit
Admission: RE | Admit: 2021-06-24 | Discharge: 2021-06-24 | Disposition: A | Discharge status: Home / Self Care | Payer: Medicare HMO | Source: Ambulatory Visit | Attending: Radiation Oncology | Admitting: Radiation Oncology

## 2021-06-24 DIAGNOSIS — C61 Malignant neoplasm of prostate: Secondary | ICD-10-CM | POA: Diagnosis not present

## 2021-06-24 DIAGNOSIS — Z510 Encounter for antineoplastic radiation therapy: Secondary | ICD-10-CM | POA: Diagnosis not present

## 2021-06-25 ENCOUNTER — Ambulatory Visit
Admission: RE | Admit: 2021-06-25 | Discharge: 2021-06-25 | Disposition: A | Discharge status: Home / Self Care | Payer: Medicare HMO | Source: Ambulatory Visit | Attending: Radiation Oncology | Admitting: Radiation Oncology

## 2021-06-25 ENCOUNTER — Telehealth: Payer: Self-pay

## 2021-06-25 DIAGNOSIS — Z510 Encounter for antineoplastic radiation therapy: Secondary | ICD-10-CM | POA: Diagnosis not present

## 2021-06-25 DIAGNOSIS — C61 Malignant neoplasm of prostate: Secondary | ICD-10-CM | POA: Diagnosis not present

## 2021-06-25 NOTE — Progress Notes (Signed)
° ° °  Chronic Care Management Pharmacy Assistant   Name: Colton Porter  MRN: [id number] DOB: [birth year]  Reason for Encounter: CCM (Jardiance patient assistance renewal 2023)   Spoke with [employer] in regards to [place] dosage being increased for patient. Patient's enrollment ends 06/26/2021. Patient needs to complete new forms for 2023. A new prescription will also need to be sent to [facility] for the increased dosage and for 2023. I called patient to inform him. Patient would like his forms mailed to him. Patient will bring the forms back to the office once completed. Forms have been completed and uploaded.   Debbora Dus, CPP notified  [name], [place] Clinical Pharmacy Assistant [phone]  Time Spent: 30 Minutes

## 2021-06-25 NOTE — Telephone Encounter (Addendum)
Home BG elevated and recent A1c increased from 8.6 to 9.3%. Expected BG to rise as pt was taken off Trulicity due to upset stomach (this is still in question and may need to be re-tried). PCP recently increased Jardiance to 25 mg daily, pt has not yet received new dose - need to update rx through [facility]. Amy Manus Rudd is working on this. Pt has endocrinology visit 07/01/20. Will have Charlene Brooke follow up in February since my schedule is full through April.   Lab Results  Component Value Date/Time   HGBA1C 9.3 (A) 06/22/2021 09:12 AM   HGBA1C 8.6 (A) 03/22/2021 08:40 AM   HGBA1C 11.5 (H) 02/08/2021 08:02 AM   HGBA1C 10.6 (H) 01/22/2020 07:28 AM

## 2021-06-29 ENCOUNTER — Ambulatory Visit
Admission: RE | Admit: 2021-06-29 | Discharge: 2021-06-29 | Disposition: A | Discharge status: Home / Self Care | Payer: Medicare HMO | Source: Ambulatory Visit | Attending: Radiation Oncology | Admitting: Radiation Oncology

## 2021-06-29 ENCOUNTER — Telehealth: Payer: Self-pay

## 2021-06-29 DIAGNOSIS — Z510 Encounter for antineoplastic radiation therapy: Secondary | ICD-10-CM | POA: Insufficient documentation

## 2021-06-29 DIAGNOSIS — C61 Malignant neoplasm of prostate: Secondary | ICD-10-CM | POA: Insufficient documentation

## 2021-06-29 NOTE — Telephone Encounter (Signed)
Jardiance 25 mg PAP forms reviewed and sent to [facility] to mail to patient.

## 2021-06-30 ENCOUNTER — Ambulatory Visit
Admission: RE | Admit: 2021-06-30 | Discharge: 2021-06-30 | Disposition: A | Discharge status: Home / Self Care | Payer: Medicare HMO | Source: Ambulatory Visit | Attending: Radiation Oncology | Admitting: Radiation Oncology

## 2021-06-30 DIAGNOSIS — C61 Malignant neoplasm of prostate: Secondary | ICD-10-CM | POA: Diagnosis not present

## 2021-06-30 DIAGNOSIS — Z510 Encounter for antineoplastic radiation therapy: Secondary | ICD-10-CM | POA: Diagnosis not present

## 2021-06-30 NOTE — Telephone Encounter (Signed)
Renewal forms for Jardiance mailed to the patient with instructions.  Debbora Dus, CPP notified  Avel Sensor, [place] Assistant [phone]  Total time spent for month CPA: 20 min.

## 2021-07-01 ENCOUNTER — Ambulatory Visit: Payer: Medicare HMO | Admitting: Endocrinology

## 2021-07-01 ENCOUNTER — Ambulatory Visit
Admission: RE | Admit: 2021-07-01 | Discharge: 2021-07-01 | Disposition: A | Discharge status: Home / Self Care | Payer: Medicare HMO | Source: Ambulatory Visit | Attending: Radiation Oncology | Admitting: Radiation Oncology

## 2021-07-01 DIAGNOSIS — C61 Malignant neoplasm of prostate: Secondary | ICD-10-CM | POA: Diagnosis not present

## 2021-07-01 DIAGNOSIS — Z510 Encounter for antineoplastic radiation therapy: Secondary | ICD-10-CM | POA: Diagnosis not present

## 2021-07-02 ENCOUNTER — Ambulatory Visit
Admission: RE | Admit: 2021-07-02 | Discharge: 2021-07-02 | Disposition: A | Discharge status: Home / Self Care | Payer: Medicare HMO | Source: Ambulatory Visit | Attending: Radiation Oncology | Admitting: Radiation Oncology

## 2021-07-02 DIAGNOSIS — Z510 Encounter for antineoplastic radiation therapy: Secondary | ICD-10-CM | POA: Diagnosis not present

## 2021-07-02 DIAGNOSIS — C61 Malignant neoplasm of prostate: Secondary | ICD-10-CM | POA: Diagnosis not present

## 2021-07-05 ENCOUNTER — Ambulatory Visit
Admission: RE | Admit: 2021-07-05 | Discharge: 2021-07-05 | Disposition: A | Discharge status: Home / Self Care | Payer: Medicare HMO | Source: Ambulatory Visit | Attending: Radiation Oncology | Admitting: Radiation Oncology

## 2021-07-05 DIAGNOSIS — C61 Malignant neoplasm of prostate: Secondary | ICD-10-CM | POA: Diagnosis not present

## 2021-07-05 DIAGNOSIS — Z510 Encounter for antineoplastic radiation therapy: Secondary | ICD-10-CM | POA: Diagnosis not present

## 2021-07-06 ENCOUNTER — Ambulatory Visit
Admission: RE | Admit: 2021-07-06 | Discharge: 2021-07-06 | Disposition: A | Discharge status: Home / Self Care | Payer: Medicare HMO | Source: Ambulatory Visit | Attending: Radiation Oncology | Admitting: Radiation Oncology

## 2021-07-06 ENCOUNTER — Other Ambulatory Visit: Payer: Self-pay | Admitting: Family Medicine

## 2021-07-06 DIAGNOSIS — Z510 Encounter for antineoplastic radiation therapy: Secondary | ICD-10-CM | POA: Diagnosis not present

## 2021-07-06 DIAGNOSIS — C61 Malignant neoplasm of prostate: Secondary | ICD-10-CM | POA: Diagnosis not present

## 2021-07-07 ENCOUNTER — Ambulatory Visit
Admission: RE | Admit: 2021-07-07 | Discharge: 2021-07-07 | Disposition: A | Discharge status: Home / Self Care | Payer: Medicare HMO | Source: Ambulatory Visit | Attending: Radiation Oncology | Admitting: Radiation Oncology

## 2021-07-07 DIAGNOSIS — C61 Malignant neoplasm of prostate: Secondary | ICD-10-CM | POA: Diagnosis not present

## 2021-07-07 DIAGNOSIS — Z510 Encounter for antineoplastic radiation therapy: Secondary | ICD-10-CM | POA: Diagnosis not present

## 2021-07-08 ENCOUNTER — Ambulatory Visit
Admission: RE | Admit: 2021-07-08 | Discharge: 2021-07-08 | Disposition: A | Discharge status: Home / Self Care | Payer: Medicare HMO | Source: Ambulatory Visit | Attending: Radiation Oncology | Admitting: Radiation Oncology

## 2021-07-08 DIAGNOSIS — C61 Malignant neoplasm of prostate: Secondary | ICD-10-CM | POA: Diagnosis not present

## 2021-07-08 DIAGNOSIS — Z510 Encounter for antineoplastic radiation therapy: Secondary | ICD-10-CM | POA: Diagnosis not present

## 2021-07-08 NOTE — Telephone Encounter (Signed)
Please verify how much magnesium patient is taking.  Med list says 500mg  MWF however received refill request for magnesium 400mg  BID.

## 2021-07-09 ENCOUNTER — Ambulatory Visit
Admission: RE | Admit: 2021-07-09 | Discharge: 2021-07-09 | Disposition: A | Discharge status: Home / Self Care | Payer: Medicare HMO | Source: Ambulatory Visit | Attending: Radiation Oncology | Admitting: Radiation Oncology

## 2021-07-09 DIAGNOSIS — Z510 Encounter for antineoplastic radiation therapy: Secondary | ICD-10-CM | POA: Diagnosis not present

## 2021-07-09 DIAGNOSIS — C61 Malignant neoplasm of prostate: Secondary | ICD-10-CM | POA: Diagnosis not present

## 2021-07-09 NOTE — Telephone Encounter (Signed)
Pt rtn call.  I asked about magnesium dose, pt states he's not sure.  Says he will check the bottle when he gets home and either call back with the info or send a MyChart message.

## 2021-07-09 NOTE — Telephone Encounter (Signed)
Magnesium 400mg  1 tablet by mouth twice daily

## 2021-07-09 NOTE — Telephone Encounter (Signed)
Lvm asking pt to call back.  See Dr. Synthia Innocent message below.

## 2021-07-12 ENCOUNTER — Ambulatory Visit
Admission: RE | Admit: 2021-07-12 | Discharge: 2021-07-12 | Disposition: A | Discharge status: Home / Self Care | Payer: Medicare HMO | Source: Ambulatory Visit | Attending: Radiation Oncology | Admitting: Radiation Oncology

## 2021-07-12 DIAGNOSIS — Z510 Encounter for antineoplastic radiation therapy: Secondary | ICD-10-CM | POA: Diagnosis not present

## 2021-07-12 DIAGNOSIS — C61 Malignant neoplasm of prostate: Secondary | ICD-10-CM | POA: Diagnosis not present

## 2021-07-12 NOTE — Telephone Encounter (Signed)
Refilled to reflect how he takes it. Latest Mg levels normal 01/2021

## 2021-07-19 ENCOUNTER — Telehealth: Payer: Self-pay

## 2021-07-19 NOTE — Telephone Encounter (Signed)
Forms emailed to patient per wife's request.

## 2021-07-19 NOTE — Telephone Encounter (Signed)
Received form. Placed in Dr Synthia Innocent inbox for signature.

## 2021-07-19 NOTE — Telephone Encounter (Addendum)
Patient left voicemail this morning stating he is out of Jardiance (took last dose yesterday). Attempted to reach pt to see if he has completed the assistance forms. Left voicemail.

## 2021-07-19 NOTE — Telephone Encounter (Addendum)
Pt called back. He has not seen any forms. His copay at the pharmacy is > $100. He will ask his wife and call back.

## 2021-07-19 NOTE — Telephone Encounter (Signed)
Signed and returned to [place]

## 2021-07-19 NOTE — Telephone Encounter (Signed)
Patient sent Jardiance 25 mg assistance forms by email. I have reviewed and sent to [facility] for Dr. Darnell Level to sign. Then, she will fax to [facility]. Patient is currently out of medication, but unable to afford from pharmacy.   Debbora Dus, PharmD Clinical Pharmacist Practitioner [place] [facility] at [facility] [phone]

## 2021-07-20 NOTE — Telephone Encounter (Signed)
Forms faxed over as requested.

## 2021-07-21 NOTE — Progress Notes (Signed)
Called [employer] in regards to patient's Jardiance patient assistance application. Patient was denied due to being over the income limit.   Debbora Dus, CPP notified  [name], [place] Clinical Pharmacy Assistant [phone]  Time Spent: 10 Minutes

## 2021-07-26 NOTE — Telephone Encounter (Addendum)
Patient eligible through [employer]. Patient provided consent to apply. Contacted [employer] and completed application. He has been approved for $1,200 through 07/25/2022. Next steps were sent to patient's wife by email ([email or web address]).  Member ID: [id number] Group ID: [id number] BIN: [id number] PCN: Aura Fey, PharmD Clinical Pharmacist Practitioner [place] [facility] at [facility] [phone]

## 2021-07-27 NOTE — Telephone Encounter (Addendum)
Pt wife received confirmation of approval but was unable to create patient account. Contacted [employer] and created patient portal account. Pt wife received temporary password and was able to log in to the account. I explained how to use the copay card information. [facility] will [facility].

## 2021-07-28 ENCOUNTER — Telehealth: Payer: Self-pay

## 2021-07-28 DIAGNOSIS — D849 Immunodeficiency, unspecified: Secondary | ICD-10-CM | POA: Diagnosis not present

## 2021-07-28 DIAGNOSIS — Z944 Liver transplant status: Secondary | ICD-10-CM | POA: Diagnosis not present

## 2021-07-28 DIAGNOSIS — K760 Fatty (change of) liver, not elsewhere classified: Secondary | ICD-10-CM | POA: Diagnosis not present

## 2021-07-28 DIAGNOSIS — Z6841 Body Mass Index (BMI) 40.0 and over, adult: Secondary | ICD-10-CM | POA: Diagnosis not present

## 2021-07-28 NOTE — Progress Notes (Signed)
Chronic Care Management Pharmacy Assistant   Name: Colton Porter  MRN: [id number] DOB: 08/19/1965  Reason for Encounter: CCM (Appointment Reminder)  Medications: Outpatient Encounter Medications as of 07/28/2021  Medication Sig   Accu-Chek FastClix Lancets MISC USE THREE TIMES DAILY TO CHECK SUGARS E65.11, INSULIN USE   ACCU-CHEK GUIDE test strip USE AS INSTRUCTED TO CHECK SUGARS THREE TIMES DAILY E11.65, INSULIN USE   acetaminophen (TYLENOL) 500 MG tablet Take 500 mg by mouth every 6 (six) hours as needed for mild pain.   allopurinol (ZYLOPRIM) 300 MG tablet TAKE 1 TABLET EVERY DAY   ASPIR-LOW 81 MG EC tablet Take 1 tablet (81 mg total) by mouth daily.   B-D UF III MINI PEN NEEDLES 31G X 5 MM MISC USE TO INJECT INSULIN DAILY   Blood Glucose Monitoring Suppl (ACCU-CHEK GUIDE) w/Device KIT 1 Units by Does not apply route as directed.   Cholecalciferol (VITAMIN D3) 2000 units TABS Take 1 tablet by mouth at bedtime.   docusate sodium (COLACE) 100 MG capsule Take 100 mg by mouth daily as needed for mild constipation.    empagliflozin (JARDIANCE) 25 MG TABS tablet Take 1 tablet (25 mg total) by mouth daily before breakfast.   escitalopram (LEXAPRO) 10 MG tablet TAKE 1 TABLET EVERY DAY   fluticasone (FLONASE) 50 MCG/ACT nasal spray SPRAY 2 SPRAYS INTO EACH NOSTRIL EVERY DAY   furosemide (LASIX) 40 MG tablet Take 1 tablet (40 mg total) by mouth daily.   glucose blood (ACCU-CHEK GUIDE) test strip Check sugars three times daily and as needed when feeling ill E11.65, insulin use   insulin glargine (LANTUS SOLOSTAR) 100 UNIT/ML Solostar Pen Inject 100 Units into the skin every morning. And pen needles 1/day   loratadine (CLARITIN) 10 MG tablet Take 10 mg by mouth daily.   losartan (COZAAR) 50 MG tablet TAKE 1 TABLET EVERY DAY   lovastatin (MEVACOR) 20 MG tablet TAKE 1 TABLET (20 MG TOTAL) BY MOUTH AT BEDTIME.   magnesium oxide (MAG-OX) 400 (240 Mg) MG tablet TAKE 1 TABLET BY MOUTH TWICE A DAY    metFORMIN (GLUCOPHAGE-XR) 500 MG 24 hr tablet TAKE 1 TABLET BY MOUTH EVERY DAY WITH BREAKFAST   methocarbamol (ROBAXIN) 500 MG tablet Take 1 tablet (500 mg total) by mouth 3 (three) times daily as needed for muscle spasms (sedation precautions).   Multiple Vitamins-Minerals (CVS SPECTRAVITE PO) Take 1 tablet by mouth at bedtime.   mycophenolate (MYFORTIC) 360 MG TBEC EC tablet Take 360 mg by mouth 2 (two) times daily.   Omega-3 Fatty Acids (FISH OIL PO) Take 1 tablet by mouth at bedtime.   omeprazole (PRILOSEC) 40 MG capsule Take 1 capsule (40 mg total) by mouth daily.   oxyCODONE (ROXICODONE) 5 MG immediate release tablet 1-2 tabs PO q6 hours prn pain   tacrolimus (PROGRAF) 1 MG capsule Take 1-2 mg by mouth See admin instructions. Take 2 mg by mouth in the morning, then take 1 mg  by mouth in the evening   No facility-administered encounter medications on file as of 07/28/2021.   Colton Porter was contacted to remind of upcoming telephone visit with Colton Porter on 07/30/2021 at 1:45 pm. Patient was reminded to have all medications, supplements and any blood glucose and blood pressure readings available for review at appointment. If unable to reach, a voicemail was left for patient.   Star Rating Drugs: Medication:  Last Fill: Day Supply Lovastatin 20 mg 07/06/2021 90  Losartan 50 mg 06/09/2021 90  Metformin 500 mg 07/26/2021 90 Lantus 100 units 48/35/0757 90  Trulicity 3.22 mg 56/72/0919 28 [facility]   Colton Porter, CPP notified  Colton Porter, [place] Clinical Pharmacy Assistant [phone]  Time Spent: 10 Minutes

## 2021-07-29 ENCOUNTER — Telehealth: Payer: Medicare HMO

## 2021-07-30 ENCOUNTER — Other Ambulatory Visit: Payer: Self-pay

## 2021-07-30 ENCOUNTER — Ambulatory Visit (INDEPENDENT_AMBULATORY_CARE_PROVIDER_SITE_OTHER): Payer: Medicare HMO | Admitting: Pharmacist

## 2021-07-30 DIAGNOSIS — I10 Essential (primary) hypertension: Secondary | ICD-10-CM

## 2021-07-30 DIAGNOSIS — E1169 Type 2 diabetes mellitus with other specified complication: Secondary | ICD-10-CM

## 2021-07-30 NOTE — Progress Notes (Signed)
Chronic Care Management Pharmacy Note  07/30/2021 Name:  Colton Porter MRN:  [id number] DOB:  11-10-1965  Summary: -Pt restarted Jardiance 07/28/21 after being off a month due to cost -Pt has been checking BG the last few days: fasting 180, 241; post-prandial 311, 514, 214; he endorses compliance with Lantus 100 units at night and metformin 500 mg/day -Pt is interested in [place] - this should be covered since he is on insulin  Recommendations/Changes made from today's visit: -Advised to split Lantus dose - 50 units BID -Consider re-trial of Trulicity (previously stopped due to diarrhea) or Rybelsus (f/u 1 month) -Start Freestyle [place] - order through DME  Plan: -Pharmacist follow up televisit scheduled for 1 month -PCP 4-monthf/u 10/27/21   Subjective: Colton ASEBEDOis an 56y.o. year old male who is a primary patient of GRia Bush MD.  The CCM team was consulted for assistance with disease management and care coordination needs.    Engaged with patient by telephone for follow up visit in response to provider referral for pharmacy case management and/or care coordination services.   Consent to Services:  The patient was given information about Chronic Care Management services, agreed to services, and gave verbal consent prior to initiation of services.  Please see initial visit note for detailed documentation.   Patient Care Team: GRia Bush MD as PCP - General (Family Medicine) PFreada Bergeron MD as PCP - Cardiology (Cardiology) ADebbora Dus RFred[facility] LLCas Pharmacist (Pharmacist)  Recent office visits: 06/22/21 Dr GDanise MinaOV: f/u DM. A1c 8.6 > 9.3%; increased Jardiance to 25 mg. RTC 2 weeks for BMP on higher Jardiance dose 04/29/21 - PCP message - Back pain, trial robaxin PRN   Recent consult visits:  04/28/21 - Urology - Procedural visit 03/23/21 - Urology - Prostate cancer follow up, referred to radiation oncology. Start Eligard.  03/22/21 -  Endocrinology - A1c improved, 8.6%. Follow up with PCP.   Hospital visits:  None in previous 6 months  Objective:  Lab Results  Component Value Date   CREATININE 1.31 (H) 04/02/2021   BUN 14 04/02/2021   GFR 68.61 02/08/2021   GFRNONAA >60 08/02/2019   GFRAA >60 08/02/2019   NA 140 04/02/2021   K 4.1 04/02/2021   CALCIUM 9.2 04/02/2021   CO2 26 04/02/2021   GLUCOSE 138 (H) 04/02/2021   Lab Results  Component Value Date/Time   HGBA1C 9.3 (A) 06/22/2021 09:12 AM   HGBA1C 8.6 (A) 03/22/2021 08:40 AM   HGBA1C 11.5 (H) 02/08/2021 08:02 AM   HGBA1C 10.6 (H) 01/22/2020 07:28 AM   FRUCTOSAMINE 436 (H) 04/22/2020 08:35 AM   FRUCTOSAMINE 232 08/02/2017 08:26 AM   GFR 68.61 02/08/2021 08:02 AM   GFR 70.48 04/07/2020 08:39 AM   MICROALBUR <0.7 08/27/2014 09:51 AM    Last diabetic Eye exam:  Lab Results  Component Value Date/Time   HMDIABEYEEXA No Retinopathy 03/20/2020 12:00 AM     Lab Results  Component Value Date   CHOL 98 02/08/2021   HDL 26.50 (L) 02/08/2021   LDLCALC 44 10/06/2017   LDLDIRECT 38.0 02/08/2021   TRIG (H) 02/08/2021    409.0 Triglyceride is over 400; calculations on Lipids are invalid.   CHOLHDL 4 02/08/2021    Hepatic Function Latest Ref Rng & Units 04/02/2021 02/08/2021 04/07/2020  Total Protein 6.1 - 8.1 g/dL 7.4 6.9 -  Albumin 3.5 - 5.2 g/dL - 3.9 4.1  AST 10 - 35 U/L 16 14 -  ALT  9 - 46 U/L 21 21 -  Alk Phosphatase 39 - 117 U/L - 102 -  Total Bilirubin 0.2 - 1.2 mg/dL 0.6 0.5 -  Bilirubin, Direct 0.0 - 0.2 mg/dL - - -    Lab Results  Component Value Date/Time   TSH 1.10 04/07/2020 08:39 AM   TSH 0.716 07/18/2019 11:33 AM   TSH 1.47 10/03/2016 08:17 AM   FREET4 0.91 11/01/2013 03:06 PM   FREET4 0.9 05/27/2009 10:50 AM    CBC Latest Ref Rng & Units 05/26/2021 04/02/2021 02/08/2021  WBC 4.0 - 10.5 K/uL 7.0 7.9 6.6  Hemoglobin 13.0 - 17.0 g/dL 13.1 13.8 13.0  Hematocrit 39.0 - 52.0 % 39.5 42.2 38.8(L)  Platelets 150 - 400 K/uL 162 182  143.0(L)    Lab Results  Component Value Date/Time   VD25OH 43.81 02/08/2021 08:02 AM   VD25OH 38.35 01/22/2020 07:28 AM    Clinical ASCVD: No  The ASCVD Risk score (Arnett DK, et al., 2019) failed to calculate for the following reasons:   The valid total cholesterol range is 130 to 320 mg/dL    Depression screen [facility] 2/9 02/15/2021 01/22/2020 10/15/2018  Decreased Interest 0 0 0  Down, Depressed, Hopeless 0 0 0  PHQ - 2 Score 0 0 0  Altered sleeping 2 0 0  Tired, decreased energy 2 0 0  Change in appetite 1 0 0  Feeling bad or failure about yourself  0 0 0  Trouble concentrating 0 0 0  Moving slowly or fidgety/restless 0 0 0  Suicidal thoughts 0 0 0  PHQ-9 Score 5 0 0  Difficult doing work/chores - Not difficult at all Not difficult at all  Some recent data might be hidden    Social History   Tobacco Use  Smoking Status Never  Smokeless Tobacco Former   Types: Chew   BP Readings from Last 3 Encounters:  06/22/21 140/68  04/28/21 131/82  04/13/21 128/88   Pulse Readings from Last 3 Encounters:  06/22/21 86  04/28/21 79  04/13/21 84   Wt Readings from Last 3 Encounters:  06/22/21 260 lb 4 oz (118 kg)  04/28/21 256 lb (116.1 kg)  04/13/21 258 lb (117 kg)   BMI Readings from Last 3 Encounters:  06/22/21 41.38 kg/m  04/28/21 40.70 kg/m  04/13/21 41.02 kg/m    Assessment/Interventions: Review of patient past medical history, allergies, medications, health status, including review of consultants reports, laboratory and other test data, was performed as part of comprehensive evaluation and provision of chronic care management services.   SDOH:  (Social Determinants of Health) assessments and interventions performed: Yes    SDOH Screenings   Alcohol Screen: Not on file  Depression (PHQ2-9): Medium Risk   PHQ-2 Score: 5  Financial Resource Strain: Low Risk    Difficulty of Paying Living Expenses: Not very hard  Food Insecurity: Not on file  Housing: Not on  file  Physical Activity: Not on file  Social Connections: Not on file  Stress: Not on file  Tobacco Use: Medium Risk   Smoking Tobacco Use: Never   Smokeless Tobacco Use: Former   Passive Exposure: Not on file  Transportation Needs: Not on file    CCM Care Plan  Allergies  Allergen Reactions   Tolmetin Rash    cirrhosis   Ambien [Zolpidem Tartrate] Other (See Comments)    Over-toxicity from liver failure   Morphine And Related     Hallucinations.   Trulicity [Dulaglutide]  frequency   Hydrochlorothiazide W-Triamterene Other (See Comments)    REACTION: dizzy, nausea   Lisinopril Other (See Comments)    REACTION: cough, decreased libido    Medications Reviewed Today     Reviewed by Charlton Haws, [facility] (Pharmacist) on 07/30/21 at 1405  Med List Status: <None>   Medication Order Taking? Sig Documenting Provider Last Dose Status Informant  Accu-Chek FastClix Lancets MISC [id number] Yes USE THREE TIMES DAILY TO CHECK SUGARS E65.11, INSULIN USE Ria Bush, MD Taking Active   ACCU-CHEK GUIDE test strip [id number] Yes USE AS INSTRUCTED TO CHECK SUGARS THREE TIMES DAILY E11.65, INSULIN USE Ria Bush, MD Taking Active   acetaminophen (TYLENOL) 500 MG tablet [id number] Yes Take 500 mg by mouth every 6 (six) hours as needed for mild pain. [provider] Taking Active Spouse/Significant Other  allopurinol (ZYLOPRIM) 300 MG tablet [id number] Yes TAKE 1 TABLET EVERY DAY Ria Bush, MD Taking Active   ASPIR-LOW 81 MG EC tablet [id number] Yes Take 1 tablet (81 mg total) by mouth daily. Ria Bush, MD Taking Active   B-D UF III MINI PEN NEEDLES 31G X 5 MM MISC [id number] Yes USE TO [place] Ria Bush, MD Taking Active   Blood Glucose Monitoring Suppl (ACCU-CHEK GUIDE) w/Device KIT [id number] Yes 1 Units by Does not apply route as directed. Ria Bush, MD Taking Active Spouse/Significant Other  Cholecalciferol (VITAMIN D3) 2000  units TABS [id number] Yes Take 1 tablet by mouth at bedtime. [provider] Taking Active Spouse/Significant Other  docusate sodium (COLACE) 100 MG capsule [id number] Yes Take 100 mg by mouth daily as needed for mild constipation.  [provider] Taking Active Spouse/Significant Other  empagliflozin (JARDIANCE) 25 MG TABS tablet [id number] Yes Take 1 tablet (25 mg total) by mouth daily before breakfast. Ria Bush, MD Taking Active   escitalopram (LEXAPRO) 10 MG tablet [id number] Yes TAKE 1 TABLET EVERY DAY Ria Bush, MD Taking Active   fluticasone [facility]) 50 MCG/ACT nasal spray [id number] Yes SPRAY 2 SPRAYS INTO EACH NOSTRIL EVERY DAY Ria Bush, MD Taking Active   furosemide (LASIX) 40 MG tablet [id number] Yes Take 1 tablet (40 mg total) by mouth daily. Freada Bergeron, MD Taking Active   glucose blood (ACCU-CHEK GUIDE) test strip [id number] Yes Check sugars three times daily and as needed when feeling ill E11.65, insulin use Ria Bush, MD Taking Active Spouse/Significant Other  insulin glargine (LANTUS SOLOSTAR) 100 UNIT/ML Solostar Pen [id number] Yes Inject 100 Units into the skin every morning. And pen needles 1/day Renato Shin, MD Taking Active   loratadine (CLARITIN) 10 MG tablet [id number] Yes Take 10 mg by mouth daily. [provider] Taking Active Spouse/Significant Other  losartan (COZAAR) 50 MG tablet [id number] Yes TAKE 1 TABLET EVERY DAY Ria Bush, MD Taking Active   lovastatin (MEVACOR) 20 MG tablet [id number] Yes TAKE 1 TABLET (20 MG TOTAL) BY MOUTH AT BEDTIME. Ria Bush, MD Taking Active   magnesium oxide (MAG-OX) 400 (240 Mg) MG tablet [id number] Yes TAKE 1 TABLET BY MOUTH TWICE A Velora Heckler, MD Taking Active   metFORMIN (GLUCOPHAGE-XR) 500 MG 24 hr tablet [id number] Yes TAKE 1 TABLET BY MOUTH EVERY DAY WITH Sander Radon, MD Taking Active   methocarbamol (ROBAXIN) 500 MG tablet [id number]  Yes Take 1 tablet (500 mg total) by mouth 3 (three) times daily as needed for muscle spasms (sedation precautions). Ria Bush, MD Taking Active   Multiple Vitamins-Minerals (CVS SPECTRAVITE PO) [id number] Yes Take 1 tablet by mouth  at bedtime. [provider] Taking Active Spouse/Significant Other  mycophenolate (MYFORTIC) 360 MG TBEC EC tablet [id number] Yes Take 360 mg by mouth 2 (two) times daily. [provider] Taking Active   Omega-3 Fatty Acids (FISH OIL PO) [id number] Yes Take 1 tablet by mouth at bedtime. [provider] Taking Active Spouse/Significant Other  omeprazole (PRILOSEC) 40 MG capsule [id number] Yes Take 1 capsule (40 mg total) by mouth daily. Ria Bush, MD Taking Active   oxyCODONE (ROXICODONE) 5 MG immediate release tablet [id number] Yes 1-2 tabs PO q6 hours prn pain Leanora Cover, MD Taking Active   tacrolimus (PROGRAF) 1 MG capsule [id number] Yes Take 1-2 mg by mouth See admin instructions. Take 2 mg by mouth in the morning, then take 1 mg  by mouth in the evening [provider] Taking Active Spouse/Significant Other            Patient Active Problem List   Diagnosis Date Noted   Sinus congestion 06/22/2021   Diarrhea of presumed infectious origin 04/02/2021   Medicare annual wellness visit, subsequent 02/15/2021   Immunosuppressed status ([place]) 11/10/2020   Chronic right-sided thoracic back pain 04/07/2020   Memory loss 04/07/2020   Perianal pruritus 02/06/2020   Prostate cancer ([place]) 02/06/2020   Advanced directives, counseling/discussion 02/06/2020   Pain, dental 08/21/2019   Orthostatic hypotension 07/31/2019   Syncope 07/30/2019   Left rib fracture 07/27/2019   Pneumonia due to COVID-19 virus 07/18/2019   [place] lesion, left 02/05/2018   Acute sinusitis 10/23/2017   Chronic heel pain, left 10/14/2017   Anejaculation 08/09/2017   Chronic gout 05/22/2017   Dyspnea 11/18/2016   BPH (benign prostatic  hyperplasia) 10/05/2016   Status post liver transplant ([place]) 03/15/2016   Portal hypertensive gastropathy ([place]) 12/04/2015   Type 2 diabetes mellitus with other specified complication ([place]) 09/73/5329   Gynecomastia    CKD (chronic kidney disease) stage 3, GFR 30-59 ml/min (HCC) 11/04/2015   Depression, major, single episode, moderate ([place]) 07/09/2015   Headache 02/19/2015   Insomnia 12/06/2014   Hypomagnesemia    Other fatigue 11/18/2014   Internal hemorrhoid 10/01/2014   Erectile dysfunction 92/42/6834   Alcoholic cirrhosis of liver without ascites ([place]) 07/28/2014   (HFpEF) heart failure with preserved ejection fraction ([place]) 11/01/2013   Essential hypertension 06/04/2013   OSA on CPAP 07/11/2012   History of alcohol dependence ([place]) 07/11/2012   Thrombocytopenia ([place]) 12/15/2011   Morbid obesity with BMI of 40.0-44.9, adult ([place]) 11/16/2011   Routine general medical examination at a health care facility 09/22/2010   NEUROPATHY 05/27/2009   Pedal edema 09/22/2008   Dyslipidemia associated with type 2 diabetes mellitus ([place]) 02/15/2007   Allergic rhinitis 02/15/2007   GERD 02/15/2007    Immunization History  Administered Date(s) Administered   Hep A / Hep B 01/16/2015, 01/23/2015, 02/06/2015   Hepatitis B, adult 12/04/2015   Influenza Whole 03/24/2010   Influenza,inj,Quad PF,6+ Mos 04/22/2015, 04/06/2017, 04/23/2018, 04/23/2019, 04/07/2020, 06/22/2021   PFIZER(Purple Top)SARS-COV-2 Vaccination 11/07/2019, 11/28/2019, 05/07/2020, 12/04/2020   PNEUMOCOCCAL CONJUGATE-20 02/15/2021   Pneumococcal Polysaccharide-23 04/22/2015   Td 05/01/2007   Tdap 12/14/2015, 10/05/2020    Conditions to be addressed/monitored:  Hypertension, Hyperlipidemia, and Diabetes  Care Plan : [place]  Updates made by Charlton Haws, [place] since 07/30/2021 12:00 AM     Problem: Hypertension, Hyperlipidemia, and Diabetes   Priority: High     Long-Range Goal: Disease Management    Start Date: 03/02/2021  Expected End Date: 07/30/2022  This  Visit's Progress: Not on track  Recent Progress: Not on track  Priority: High  Note:   Current Barriers:  Unable to achieve control of diabetes  Pharmacist Clinical Goal(s):  Patient will contact provider office for questions/concerns as evidenced notation of same in electronic health record through collaboration with PharmD and provider.   Interventions: 1:1 collaboration with Ria Bush, MD regarding development and update of comprehensive plan of care as evidenced by provider attestation and co-signature Inter-disciplinary care team collaboration (see longitudinal plan of care) Comprehensive medication review performed; medication list updated in electronic medical record  Hypertension / HFpEF (BP goal <140/90) -Controlled -Current treatment: Losartan 50 mg daily Furosemide 40 mg daily -Denies hypotensive/hypertensive symptoms -Educated on BP goals and benefits of medications for prevention of heart attack, stroke and kidney damage; -Counseled to monitor BP at home periodically -Recommended to continue current medication   Hyperlipidemia: (LDL goal < 100 -Did not discuss today due to stable/controlled -Controlled - refills timely, LDL 38 but TG elevated above goal secondary to DM. -Current treatment: Lovastatin 20 mg - 1 tablet daily at bedtime  -Medications previously tried: none  -Recommended to continue current medication  Diabetes (A1c goal <8%) - Uncontrolled - A1c worsened 8.6> 9.3% 05/2021; pt did not take Jardiance in January due to cost, recently approved for [employer] and started taking it again 07/28/21 -Working on diet, switched tea to sugar free options, sugar free jello -Current home glucose readings  fasting glucose: 251, 180 post prandial glucose: 311 (after pizza and salad); 514 at night, 214  -Current medications: Jardiance 25 mg daily Lantus Solostar 100 units HS Metformin XR  500 mg daily -Medications previously tried: Trulicity (diarrhea, cost) -Discussed poor absorption of insulin > 80 units -Discussed CGM - pt is interested in starting [employer]. He should qualify for CGM due to taking insulin -Recommend to split insulin dose - 50 units AM and PM -Recommend Freestyle [place] 2 - order through DME  Health Maintenance -Vaccine gaps: Covid booster, Shingrix -Prostate cancer Dx 02/2021, undergoing radiation  Patient Goals/Self-Care Activities Patient will:  - take medications as prescribed as evidenced by patient report and record review focus on medication adherence by routine check glucose twice daily, document, and provide at future appointments engage in dietary modifications by limiting carbs, sugar      Medication Assistance:  Lantus ([employer] PAP) Jardiance ([employer]) - approved through 07/25/22  Member ID: [id number] Group ID: [id number] RX BIN: [id number] PCN: [id number]  Compliance/Adherence/Medication fill history: Care Gaps: Eye exam due   Star rating drugs: Medication:                            Last Fill:         Day Supply Metformin XR 544m   07/26/21             90, PDC 100% Losartan 530m                      06/09/21            90, PDC 100% Lovastatin 2024m                   07/06/21             90, PDC 100% Jardiance  07/27/21  90 ds; [place] inaccurate  Patient's [facility] pharmacy [facility]:  [id number] [place] [place] [facility] [address]Phone: [phone]: [phone] [facility] [place] [address]Phone: [phone]: [phone] [id number] [place] [address] [place]: [phone]: [phone] Care Plan and  Follow Up Patient Decision:  Patient agrees to Care Plan and Follow-up.  Follow Up Plan: Telephone follow up appointment with care management team member scheduled for: 1 month  LiCharlene BrookePharmD, BCACP Clinical Pharmacist [phone]

## 2021-07-30 NOTE — Patient Instructions (Signed)
Visit Information  Phone number for Pharmacist: [phone]   Goals Addressed             This Visit's Progress    Monitor and Manage My Blood Sugar-Diabetes Type 2       Timeframe:  Long-Range Goal Priority:  Medium Start Date:      07/30/21                       Expected End Date:    07/30/22                   Follow Up Date March 2023   - check blood sugar at prescribed times - check blood sugar if I feel it is too high or too low - enter blood sugar readings and medication or insulin into daily log - take the blood sugar meter to all doctor visits    Why is this important?   Checking your blood sugar at home helps to keep it from getting very high or very low.  Writing the results in a diary or log helps the doctor know how to care for you.  Your blood sugar log should have the time, date and the results.  Also, write down the amount of insulin or other medicine that you take.  Other information, like what you ate, exercise done and how you were feeling, will also be helpful.     Notes:         Care Plan : Colton Porter  Updates made by Charlton Haws, RPH since 07/30/2021 12:00 AM     Problem: Hypertension, Hyperlipidemia, and Diabetes   Priority: High     Long-Range Goal: Disease Management   Start Date: 03/02/2021  Expected End Date: 07/30/2022  This Visit's Progress: Not on track  Recent Progress: Not on track  Priority: High  Note:   Current Barriers:  Unable to achieve control of diabetes  Pharmacist Clinical Goal(s):  Patient will contact provider office for questions/concerns as evidenced notation of same in electronic health record through collaboration with PharmD and provider.   Interventions: 1:1 collaboration with Ria Bush, MD regarding development and update of comprehensive plan of care as evidenced by provider attestation and co-signature Inter-disciplinary care team collaboration (see longitudinal plan of  care) Comprehensive medication review performed; medication list updated in electronic medical record  Hypertension / HFpEF (BP goal <140/90) -Controlled -Current treatment: Losartan 50 mg daily Furosemide 40 mg daily -Denies hypotensive/hypertensive symptoms -Educated on BP goals and benefits of medications for prevention of heart attack, stroke and kidney damage; -Counseled to monitor BP at home periodically -Recommended to continue current medication   Hyperlipidemia: (LDL goal < 100 -Did not discuss today due to stable/controlled -Controlled - refills timely, LDL 38 but TG elevated above goal secondary to DM. -Current treatment: Lovastatin 20 mg - 1 tablet daily at bedtime  -Medications previously tried: none  -Recommended to continue current medication  Diabetes (A1c goal <8%) - Uncontrolled - A1c worsened 8.6> 9.3% 05/2021; pt did not take Jardiance in January due to cost, recently approved for [employer] and started taking it again 07/28/21 -Working on diet, switched tea to sugar free options, sugar free jello -Current home glucose readings  fasting glucose: 251, 180 post prandial glucose: 311 (after pizza and salad); 514 at night, 214  -Current medications: Jardiance 25 mg daily Lantus Solostar 100 units HS Metformin XR 500 mg daily -Medications previously tried: Trulicity (diarrhea,  cost) -Discussed poor absorption of insulin > 80 units -Discussed CGM - pt is interested in starting [employer]. He should qualify for CGM due to taking insulin -Recommend to split insulin dose - 50 units AM and PM -Recommend Freestyle Libre 2 - order through DME  Health Maintenance -Vaccine gaps: Covid booster, Shingrix -Prostate cancer Dx 02/2021, undergoing radiation  Patient Goals/Self-Care Activities Patient will:  - take medications as prescribed as evidenced by patient report and record review focus on medication adherence by routine check glucose twice daily,  document, and provide at future appointments engage in dietary modifications by limiting carbs, sugar       Patient verbalizes understanding of instructions and care plan provided today and agrees to view in [place]. Active MyChart status confirmed with patient.   Telephone follow up appointment with pharmacy team member scheduled for: 1 month  Charlene Brooke, PharmD, [facility] Clinical Pharmacist [facility] [phone]

## 2021-07-31 ENCOUNTER — Telehealth: Payer: Self-pay | Admitting: Family Medicine

## 2021-07-31 ENCOUNTER — Other Ambulatory Visit: Payer: Self-pay | Admitting: Family Medicine

## 2021-07-31 DIAGNOSIS — E1169 Type 2 diabetes mellitus with other specified complication: Secondary | ICD-10-CM

## 2021-07-31 DIAGNOSIS — Z794 Long term (current) use of insulin: Secondary | ICD-10-CM

## 2021-07-31 MED ORDER — FREESTYLE LIBRE 3 SENSOR MISC: 1.0000 [IU] | 3 refills | Status: DC

## 2021-07-31 NOTE — Telephone Encounter (Signed)
Reviewed recent CCM note - agree with splitting lantus dose.  Freestyle Libre 3 sensor sent to pharmacy.  Will await jardiance effect (recent commencement.

## 2021-08-03 ENCOUNTER — Other Ambulatory Visit: Payer: Self-pay | Admitting: Family Medicine

## 2021-08-03 DIAGNOSIS — Z794 Long term (current) use of insulin: Secondary | ICD-10-CM

## 2021-08-03 DIAGNOSIS — E1169 Type 2 diabetes mellitus with other specified complication: Secondary | ICD-10-CM

## 2021-08-03 MED ORDER — FREESTYLE LIBRE 3 SENSOR MISC: 1.0000 | 3 refills | Status: DC

## 2021-08-03 MED ORDER — FREESTYLE LIBRE 2 SENSOR MISC: 5 refills | Status: DC

## 2021-08-03 NOTE — Addendum Note (Signed)
Addended by: Charlton Haws on: 08/03/2021 04:25 PM   Modules accepted: Orders

## 2021-08-03 NOTE — Telephone Encounter (Signed)
[facility] with [facility]. Also submitted PA through cover my meds: Key [id number] Will await determination.

## 2021-08-03 NOTE — Telephone Encounter (Signed)
PA was denied via Covermymeds. Freestyle Libre 3 is not covered, but [employer] 2 is. [facility]re [facility].

## 2021-08-03 NOTE — Progress Notes (Signed)
Called [facility] in regards to status of patient's Freestyle Libre. Pharmacist stated there is no diagnoses code on the prescription. It is required per Medicare. Please resend a new prescription with a diagnoses code. I tried to verbally give the code and they can not accept it; has to be on the RX. Pharmacist also stated that there would need to be a PA approved as well.   Charlene Brooke, CPP notified  [name], [place] Clinical Pharmacy Assistant [phone]  Time Spent: 5 Minutes

## 2021-08-03 NOTE — Addendum Note (Signed)
Addended by: Charlton Haws on: 08/03/2021 02:44 PM   Modules accepted: Orders

## 2021-08-04 NOTE — Progress Notes (Signed)
[facility]ed [facility] to inquire about status of Freestyle [place] 2 sensors. [facility] states Freestyle [facility]re 2 has been put on hold due to not covered under Part D. Has to be Accucheck or True Metrix for Medicare Part D to cover the cost.   Charlene Brooke, CPP notified  [name], [place] [phone]  Time Spent: 10 Minutes

## 2021-08-06 ENCOUNTER — Other Ambulatory Visit: Payer: Self-pay | Admitting: Family Medicine

## 2021-08-06 DIAGNOSIS — Z794 Long term (current) use of insulin: Secondary | ICD-10-CM

## 2021-08-06 DIAGNOSIS — E1169 Type 2 diabetes mellitus with other specified complication: Secondary | ICD-10-CM

## 2021-08-09 ENCOUNTER — Other Ambulatory Visit: Payer: Self-pay | Admitting: Family Medicine

## 2021-08-09 NOTE — Telephone Encounter (Signed)
CGM is not covered at the pharmacy, it is likely covered through DME since he has a Medicare plan. Ordered Freestyle Libre 2 via [employer] to [facility].

## 2021-08-10 ENCOUNTER — Other Ambulatory Visit: Payer: Self-pay | Admitting: Family Medicine

## 2021-08-10 DIAGNOSIS — Z794 Long term (current) use of insulin: Secondary | ICD-10-CM

## 2021-08-10 DIAGNOSIS — E1169 Type 2 diabetes mellitus with other specified complication: Secondary | ICD-10-CM

## 2021-08-10 NOTE — Telephone Encounter (Signed)
Spoke with pt notifying him his ins co doesn't cover [employer].  Asked pt to contact ins to see if there is one that is covered.  Pt states he really didn't want to bother with it anyway.  Says don't worry about it.  Denied request.

## 2021-08-10 NOTE — Telephone Encounter (Signed)
Pt has decided he does not want the CGM.

## 2021-08-12 ENCOUNTER — Other Ambulatory Visit: Payer: Self-pay

## 2021-08-12 ENCOUNTER — Ambulatory Visit
Admission: RE | Admit: 2021-08-12 | Discharge: 2021-08-12 | Disposition: A | Discharge status: Home / Self Care | Payer: Medicare HMO | Source: Ambulatory Visit | Attending: Radiation Oncology | Admitting: Radiation Oncology

## 2021-08-12 ENCOUNTER — Encounter: Payer: Self-pay | Admitting: Radiation Oncology

## 2021-08-12 VITALS — BP 118/72 | HR 85 | Temp 97.9°F | Resp 19 | Ht 66.5 in | Wt 256.5 lb

## 2021-08-12 DIAGNOSIS — R5383 Other fatigue: Secondary | ICD-10-CM | POA: Insufficient documentation

## 2021-08-12 DIAGNOSIS — C61 Malignant neoplasm of prostate: Secondary | ICD-10-CM | POA: Insufficient documentation

## 2021-08-12 DIAGNOSIS — Z923 Personal history of irradiation: Secondary | ICD-10-CM | POA: Diagnosis not present

## 2021-08-12 NOTE — Progress Notes (Signed)
Radiation Oncology Follow up Note  Name: Colton Porter   Date:   08/12/2021 MRN:  [id number] DOB: 1966/01/09    This 56 y.o. 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  Status post image guided IMRT radiation therapy  REFERRING PROVIDER: Ria Bush, MD  HPI: Patient is a 56 year old male now at 1 month having completed image guided IMRT radiation therapy to his prostate for a Gleason 7 (3+4) presenting with a PSA of 16.  Seen today in routine follow-up he is still somewhat fatigued.  He specifically denies any increased lower urinary tract symptoms diarrhea..  COMPLICATIONS OF TREATMENT: none  FOLLOW UP COMPLIANCE: keeps appointments   PHYSICAL EXAM:  BP 118/72    Pulse 85    Temp 97.9 F (36.6 C)    Resp 19    Ht 5' 6.5" (1.689 m)    Wt 256 lb 8 oz (116.3 kg)    BMI 40.78 kg/m  Well-developed well-nourished patient in NAD. HEENT reveals PERLA, EOMI, discs not visualized.  Oral cavity is clear. No oral mucosal lesions are identified. Neck is clear without evidence of cervical or supraclavicular adenopathy. Lungs are clear to A&P. Cardiac examination is essentially unremarkable with regular rate and rhythm without murmur rub or thrill. Abdomen is benign with no organomegaly or masses noted. Motor sensory and DTR levels are equal and symmetric in the upper and lower extremities. Cranial nerves II through XII are grossly intact. Proprioception is intact. No peripheral adenopathy or edema is identified. No motor or sensory levels are noted. Crude visual fields are within normal range.  RADIOLOGY RESULTS: No current films for review  PLAN: Present time he is doing well with very low side effect profile from IMRT radiation.  He is quite fatigued I suggested continued more exercise and possibly weight loss.  Otherwise of asked to see him back in 3 months for follow-up with a PSA prior to that.  Patient knows to  call with any concerns.  I would like to take this opportunity to thank you for allowing me to participate in the care of your patient.Noreene Filbert, MD

## 2021-08-17 NOTE — Telephone Encounter (Signed)
Waiting on pt to call back to let us know which CGM is covered by ins co.  (See Refill  note, 08/06/21)  Spoke with pt to see if what his ins said.  Pt states he has called them but remembers he had same issue when trying to get CGM previously and ins would not cover it.  Pt says he changes his mind and does not want the meter.  States he will inform [facility].   Denied request.

## 2021-08-19 NOTE — Telephone Encounter (Signed)
Received forms from [facility] for freestyle libre 2 which I have filled and placed in Lisa's box.

## 2021-08-19 NOTE — Telephone Encounter (Signed)
Faxed order

## 2021-08-24 DIAGNOSIS — E785 Hyperlipidemia, unspecified: Secondary | ICD-10-CM

## 2021-08-24 DIAGNOSIS — Z794 Long term (current) use of insulin: Secondary | ICD-10-CM

## 2021-08-24 DIAGNOSIS — I10 Essential (primary) hypertension: Secondary | ICD-10-CM | POA: Diagnosis not present

## 2021-08-24 DIAGNOSIS — E1169 Type 2 diabetes mellitus with other specified complication: Secondary | ICD-10-CM | POA: Diagnosis not present

## 2021-08-25 ENCOUNTER — Telehealth: Payer: Self-pay

## 2021-08-25 NOTE — Progress Notes (Signed)
? ? ?Chronic Care Management ?Pharmacy Assistant  ? ?Name: Colton Porter  MRN: [id number] DOB: 03/30/1966 ? ?Reason for Encounter: CCM Counsellor) ?  ?Medications: ?Outpatient Encounter Medications as of 08/25/2021  ?Medication Sig  ? Accu-Chek FastClix Lancets MISC USE THREE TIMES DAILY TO CHECK SUGARS E65.11, INSULIN USE  ? ACCU-CHEK GUIDE test strip USE AS INSTRUCTED TO CHECK SUGARS THREE TIMES DAILY E11.65, INSULIN USE  ? acetaminophen (TYLENOL) 500 MG tablet Take 500 mg by mouth every 6 (six) hours as needed for mild pain.  ? allopurinol (ZYLOPRIM) 300 MG tablet TAKE 1 TABLET EVERY DAY  ? ASPIR-LOW 81 MG EC tablet Take 1 tablet (81 mg total) by mouth daily.  ? B-D UF III MINI PEN NEEDLES 31G X 5 MM MISC USE TO INJECT INSULIN DAILY  ? Blood Glucose Monitoring Suppl (ACCU-CHEK GUIDE) w/Device KIT 1 Units by Does not apply route as directed.  ? Cholecalciferol (VITAMIN D3) 2000 units TABS Take 1 tablet by mouth at bedtime.  ? Continuous Blood Gluc Sensor (FREESTYLE LIBRE 2 SENSOR) MISC Apply sensor every 14 days to monitor sugar continously  ? docusate sodium (COLACE) 100 MG capsule Take 100 mg by mouth daily as needed for mild constipation.   ? empagliflozin (JARDIANCE) 25 MG TABS tablet Take 1 tablet (25 mg total) by mouth daily before breakfast.  ? escitalopram (LEXAPRO) 10 MG tablet TAKE 1 TABLET EVERY DAY  ? fluticasone (FLONASE) 50 MCG/ACT nasal spray SPRAY 2 SPRAYS INTO EACH NOSTRIL EVERY DAY  ? furosemide (LASIX) 40 MG tablet Take 1 tablet (40 mg total) by mouth daily.  ? glucose blood (ACCU-CHEK GUIDE) test strip Check sugars three times daily and as needed when feeling ill E11.65, insulin use  ? insulin glargine (LANTUS SOLOSTAR) 100 UNIT/ML Solostar Pen Inject 100 Units into the skin every morning. And pen needles 1/day  ? loratadine (CLARITIN) 10 MG tablet Take 10 mg by mouth daily.  ? losartan (COZAAR) 50 MG tablet TAKE 1 TABLET EVERY DAY  ? lovastatin (MEVACOR) 20 MG tablet TAKE 1 TABLET  (20 MG TOTAL) BY MOUTH AT BEDTIME.  ? magnesium oxide (MAG-OX) 400 (240 Mg) MG tablet TAKE 1 TABLET BY MOUTH TWICE A DAY  ? metFORMIN (GLUCOPHAGE-XR) 500 MG 24 hr tablet TAKE 1 TABLET BY MOUTH EVERY DAY WITH BREAKFAST  ? methocarbamol (ROBAXIN) 500 MG tablet Take 1 tablet (500 mg total) by mouth 3 (three) times daily as needed for muscle spasms (sedation precautions).  ? Multiple Vitamins-Minerals ([facility] SPECTRAVITE PO) Take 1 tablet by mouth at bedtime.  ? mycophenolate (MYFORTIC) 360 MG TBEC EC tablet Take 360 mg by mouth 2 (two) times daily.  ? Omega-3 Fatty Acids (FISH OIL PO) Take 1 tablet by mouth at bedtime.  ? omeprazole (PRILOSEC) 40 MG capsule Take 1 capsule (40 mg total) by mouth daily.  ? oxyCODONE (ROXICODONE) 5 MG immediate release tablet 1-2 tabs PO q6 hours prn pain  ? tacrolimus (PROGRAF) 1 MG capsule Take 1-2 mg by mouth See admin instructions. Take 2 mg by mouth in the morning, then take 1 mg  by mouth in the evening  ? ?No facility-administered encounter medications on file as of 08/25/2021.  ? ?Colton Porter was contacted to remind of upcoming telephone visit with Colton Porter on 08/26/2021 at 3:45 pm. Patient was reminded to have all medications, supplements and any blood glucose and blood pressure readings available for review at appointment. If unable to reach, a voicemail was left for patient.  ? ?  Are you having any problems with your medications? No  ? ?Do you have any concerns you like to discuss with the pharmacist? No ? ?Star Rating Drugs: ?Medication:  Last Fill: Day Supply ?Lovastatin 20 mg 07/06/2021 90  ?Losartan 50 mg 08/19/2021 90 ?Metformin 500 mg 07/26/2021 90 ? ?Colton Porter, CPP notified ? ?Colton Porter, RMA ?Clinical Pharmacy Assistant ?[phone] ? ?Time Spent: 10 Minutes ?  ? ? ? ?

## 2021-08-26 ENCOUNTER — Other Ambulatory Visit: Payer: Self-pay

## 2021-08-26 ENCOUNTER — Ambulatory Visit (INDEPENDENT_AMBULATORY_CARE_PROVIDER_SITE_OTHER): Payer: Medicare HMO | Admitting: Pharmacist

## 2021-08-26 DIAGNOSIS — E1169 Type 2 diabetes mellitus with other specified complication: Secondary | ICD-10-CM

## 2021-08-26 DIAGNOSIS — I10 Essential (primary) hypertension: Secondary | ICD-10-CM

## 2021-08-26 NOTE — Progress Notes (Signed)
Chronic Care Management Pharmacy Note  08/31/2021 Name:  Colton Porter MRN:  [id number] DOB:  [birth year]  Summary: CCM F/U visit -Pt split insulin dose last months (50 units BID rather than 100 unit once), fasting BG have improved somewhat thought still above goal (BG 150-210) -Freestyle Libre was denied, [employer] apparently now requires 3 insulin injections per day to qualify  Recommendations/Changes made from today's visit: -Increase Lantus to 55 units BID (10% increase)  Plan: -Pharmacist follow up televisit scheduled for 1 month -PCP 64-monthf/u 10/27/21   Subjective: DFREEMON BINFORDis an 56y.o. year old male who is a primary patient of GRia Bush MD.  The CCM team was consulted for assistance with disease management and care coordination needs.    Engaged with patient by telephone for follow up visit in response to provider referral for pharmacy case management and/or care coordination services.   Consent to Services:  The patient was given information about Chronic Care Management services, agreed to services, and gave verbal consent prior to initiation of services.  Please see initial visit note for detailed documentation.   Patient Care Team: GRia Bush MD as PCP - General (Family Medicine) PFreada Bergeron MD as PCP - Cardiology (Cardiology) ADebbora Dus RCarolina Ambulatory Surgery Centeras Pharmacist (Pharmacist)  Recent office visits: 06/22/21 Dr GDanise MinaOV: f/u DM. A1c 8.6 > 9.3%; increased Jardiance to 25 mg. RTC 2 weeks for BMP on higher Jardiance dose 04/29/21 - PCP message - Back pain, trial robaxin PRN   Recent consult visits:  04/28/21 - Urology - Procedural visit 03/23/21 - Urology - Prostate cancer follow up, referred to radiation oncology. Start Eligard.  03/22/21 - Endocrinology - A1c improved, 8.6%. Follow up with PCP.   Hospital visits:  None in previous 6 months  Objective:  Lab Results  Component Value Date   CREATININE 1.31 (H) 04/02/2021   BUN 14  04/02/2021   GFR 68.61 02/08/2021   GFRNONAA >60 08/02/2019   GFRAA >60 08/02/2019   NA 140 04/02/2021   K 4.1 04/02/2021   CALCIUM 9.2 04/02/2021   CO2 26 04/02/2021   GLUCOSE 138 (H) 04/02/2021   Lab Results  Component Value Date/Time   HGBA1C 9.3 (A) 06/22/2021 09:12 AM   HGBA1C 8.6 (A) 03/22/2021 08:40 AM   HGBA1C 11.5 (H) 02/08/2021 08:02 AM   HGBA1C 10.6 (H) 01/22/2020 07:28 AM   FRUCTOSAMINE 436 (H) 04/22/2020 08:35 AM   FRUCTOSAMINE 232 08/02/2017 08:26 AM   GFR 68.61 02/08/2021 08:02 AM   GFR 70.48 04/07/2020 08:39 AM   MICROALBUR <0.7 08/27/2014 09:51 AM    Last diabetic Eye exam:  Lab Results  Component Value Date/Time   HMDIABEYEEXA No Retinopathy 03/20/2020 12:00 AM     Lab Results  Component Value Date   CHOL 98 02/08/2021   HDL 26.50 (L) 02/08/2021   LDLCALC 44 10/06/2017   LDLDIRECT 38.0 02/08/2021   TRIG (H) 02/08/2021    409.0 Triglyceride is over 400; calculations on Lipids are invalid.   CHOLHDL 4 02/08/2021    Hepatic Function Latest Ref Rng & Units 04/02/2021 02/08/2021 04/07/2020  Total Protein 6.1 - 8.1 g/dL 7.4 6.9 -  Albumin 3.5 - 5.2 g/dL - 3.9 4.1  AST 10 - 35 U/L 16 14 -  ALT 9 - 46 U/L 21 21 -  Alk Phosphatase 39 - 117 U/L - 102 -  Total Bilirubin 0.2 - 1.2 mg/dL 0.6 0.5 -  Bilirubin, Direct 0.0 - 0.2 mg/dL - - -  Lab Results  Component Value Date/Time   TSH 1.10 04/07/2020 08:39 AM   TSH 0.716 07/18/2019 11:33 AM   TSH 1.47 10/03/2016 08:17 AM   FREET4 0.91 11/01/2013 03:06 PM   FREET4 0.9 05/27/2009 10:50 AM    CBC Latest Ref Rng & Units 05/26/2021 04/02/2021 02/08/2021  WBC 4.0 - 10.5 K/uL 7.0 7.9 6.6  Hemoglobin 13.0 - 17.0 g/dL 13.1 13.8 13.0  Hematocrit 39.0 - 52.0 % 39.5 42.2 38.8(L)  Platelets 150 - 400 K/uL 162 182 143.0(L)    Lab Results  Component Value Date/Time   VD25OH 43.81 02/08/2021 08:02 AM   VD25OH 38.35 01/22/2020 07:28 AM    Clinical ASCVD: No  The ASCVD Risk score (Arnett DK, et al., 2019) failed  to calculate for the following reasons:   The valid total cholesterol range is 130 to 320 mg/dL    Depression screen [facility] 2/9 02/15/2021 01/22/2020 10/15/2018  Decreased Interest 0 0 0  Down, Depressed, Hopeless 0 0 0  PHQ - 2 Score 0 0 0  Altered sleeping 2 0 0  Tired, decreased energy 2 0 0  Change in appetite 1 0 0  Feeling bad or failure about yourself  0 0 0  Trouble concentrating 0 0 0  Moving slowly or fidgety/restless 0 0 0  Suicidal thoughts 0 0 0  PHQ-9 Score 5 0 0  Difficult doing work/chores - Not difficult at all Not difficult at all  Some recent data might be hidden    Social History   Tobacco Use  Smoking Status Never  Smokeless Tobacco Former   Types: Chew   BP Readings from Last 3 Encounters:  08/12/21 118/72  06/22/21 140/68  04/28/21 131/82   Pulse Readings from Last 3 Encounters:  08/12/21 85  06/22/21 86  04/28/21 79   Wt Readings from Last 3 Encounters:  08/12/21 256 lb 8 oz (116.3 kg)  06/22/21 260 lb 4 oz (118 kg)  04/28/21 256 lb (116.1 kg)   BMI Readings from Last 3 Encounters:  08/12/21 40.78 kg/m  06/22/21 41.38 kg/m  04/28/21 40.70 kg/m    Assessment/Interventions: Review of patient past medical history, allergies, medications, health status, including review of consultants reports, laboratory and other test data, was performed as part of comprehensive evaluation and provision of chronic care management services.   SDOH:  (Social Determinants of Health) assessments and interventions performed: Yes    SDOH Screenings   Alcohol Screen: Not on file  Depression (PHQ2-9): Medium Risk   PHQ-2 Score: 5  Financial Resource Strain: Low Risk    Difficulty of Paying Living Expenses: Not very hard  Food Insecurity: Not on file  Housing: Not on file  Physical Activity: Not on file  Social Connections: Not on file  Stress: Not on file  Tobacco Use: Medium Risk   Smoking Tobacco Use: Never   Smokeless Tobacco Use: Former   Passive  Exposure: Not on file  Transportation Needs: Not on file    CCM Care Plan  Allergies  Allergen Reactions   Tolmetin Rash    cirrhosis   Ambien [Zolpidem Tartrate] Other (See Comments)    Over-toxicity from liver failure   Morphine And Related     Hallucinations.   Trulicity [Dulaglutide]     frequency   Hydrochlorothiazide W-Triamterene Other (See Comments)    REACTION: dizzy, nausea   Lisinopril Other (See Comments)    REACTION: cough, decreased libido    Medications Reviewed Today     Reviewed  by Charlton Haws, [place] (Pharmacist) on 08/26/21 at [place] List Status: <None>   Medication Order Taking? Sig Documenting Provider Last Dose Status Informant  Accu-Chek FastClix Lancets MISC [id number] Yes USE THREE TIMES DAILY TO CHECK SUGARS E65.11, INSULIN USE Ria Bush, MD Taking Active   ACCU-CHEK GUIDE test strip [id number] Yes USE AS INSTRUCTED TO CHECK SUGARS THREE TIMES DAILY E11.65, INSULIN USE Ria Bush, MD Taking Active   acetaminophen (TYLENOL) 500 MG tablet [id number] Yes Take 500 mg by mouth every 6 (six) hours as needed for mild pain. [provider] Taking Active Spouse/Significant Other  allopurinol (ZYLOPRIM) 300 MG tablet [id number] Yes TAKE 1 TABLET EVERY DAY Ria Bush, MD Taking Active   ASPIR-LOW 81 MG EC tablet [id number] Yes Take 1 tablet (81 mg total) by mouth daily. Ria Bush, MD Taking Active   B-D UF III MINI PEN NEEDLES 31G X 5 MM MISC [id number] Yes USE TO [place] Ria Bush, MD Taking Active   Blood Glucose Monitoring Suppl (ACCU-CHEK GUIDE) w/Device KIT [id number] Yes 1 Units by Does not apply route as directed. Ria Bush, MD Taking Active Spouse/Significant Other  Cholecalciferol (VITAMIN D3) 2000 units TABS [id number] Yes Take 1 tablet by mouth at bedtime. [provider] Taking Active Spouse/Significant Other  Continuous Blood Gluc Sensor (FREESTYLE LIBRE 2 SENSOR) MISC  [id number] Yes Apply sensor every 14 days to monitor sugar continously Ria Bush, MD Taking Active   docusate sodium (COLACE) 100 MG capsule [id number] Yes Take 100 mg by mouth daily as needed for mild constipation.  [provider] Taking Active Spouse/Significant Other  empagliflozin (JARDIANCE) 25 MG TABS tablet [id number] Yes Take 1 tablet (25 mg total) by mouth daily before breakfast. Ria Bush, MD Taking Active   escitalopram (LEXAPRO) 10 MG tablet [id number] Yes TAKE 1 TABLET EVERY DAY Ria Bush, MD Taking Active   fluticasone [facility]) 50 MCG/ACT nasal spray [id number] Yes SPRAY 2 SPRAYS INTO EACH NOSTRIL EVERY DAY Ria Bush, MD Taking Active   furosemide (LASIX) 40 MG tablet [id number] Yes Take 1 tablet (40 mg total) by mouth daily. Freada Bergeron, MD Taking Active   glucose blood (ACCU-CHEK GUIDE) test strip [id number] Yes Check sugars three times daily and as needed when feeling ill E11.65, insulin use Ria Bush, MD Taking Active Spouse/Significant Other  insulin glargine (LANTUS SOLOSTAR) 100 UNIT/ML Solostar Pen [id number] Yes Inject 100 Units into the skin every morning. And pen needles 1/day  Patient taking differently: Inject 55 Units into the skin 2 (two) times daily. And pen needles 1/day   Renato Shin, MD Taking Active   loratadine (CLARITIN) 10 MG tablet [id number] Yes Take 10 mg by mouth daily. [provider] Taking Active Spouse/Significant Other  losartan (COZAAR) 50 MG tablet [id number] Yes TAKE 1 TABLET EVERY DAY Ria Bush, MD Taking Active   lovastatin (MEVACOR) 20 MG tablet [id number] Yes TAKE 1 TABLET (20 MG TOTAL) BY MOUTH AT BEDTIME. Ria Bush, MD Taking Active   magnesium oxide (MAG-OX) 400 (240 Mg) MG tablet [id number] Yes TAKE 1 TABLET BY MOUTH TWICE A Velora Heckler, MD Taking Active   metFORMIN (GLUCOPHAGE-XR) 500 MG 24 hr tablet [id number] Yes TAKE 1 TABLET BY MOUTH EVERY DAY WITH Sander Radon, MD Taking Active   methocarbamol (ROBAXIN) 500 MG tablet [id number] Yes Take 1 tablet (500 mg total) by mouth 3 (three) times daily as needed for muscle spasms (sedation precautions). Ria Bush, MD Taking Active   Multiple Vitamins-Minerals ([facility]  SPECTRAVITE PO) [id number] Yes Take 1 tablet by mouth at bedtime. [provider] Taking Active Spouse/Significant Other  mycophenolate (MYFORTIC) 360 MG TBEC EC tablet [id number] Yes Take 360 mg by mouth 2 (two) times daily. [provider] Taking Active   Omega-3 Fatty Acids (FISH OIL PO) [id number] Yes Take 1 tablet by mouth at bedtime. [provider] Taking Active Spouse/Significant Other  omeprazole (PRILOSEC) 40 MG capsule [id number] Yes Take 1 capsule (40 mg total) by mouth daily. Ria Bush, MD Taking Active   oxyCODONE (ROXICODONE) 5 MG immediate release tablet [id number] Yes 1-2 tabs PO q6 hours prn pain Leanora Cover, MD Taking Active   tacrolimus (PROGRAF) 1 MG capsule [id number] Yes Take 1-2 mg by mouth See admin instructions. Take 2 mg by mouth in the morning, then take 1 mg  by mouth in the evening [provider] Taking Active Spouse/Significant Other            Patient Active Problem List   Diagnosis Date Noted   Sinus congestion 06/22/2021   Diarrhea of presumed infectious origin 04/02/2021   Medicare annual wellness visit, subsequent 02/15/2021   Immunosuppressed status ([place]) 11/10/2020   Chronic right-sided thoracic back pain 04/07/2020   Memory loss 04/07/2020   Perianal pruritus 02/06/2020   Prostate cancer ([place]) 02/06/2020   Advanced directives, counseling/discussion 02/06/2020   Pain, dental 08/21/2019   Orthostatic hypotension 07/31/2019   Syncope 07/30/2019   Left rib fracture 07/27/2019   Pneumonia due to COVID-19 virus 07/18/2019   [place] lesion, left 02/05/2018   Acute sinusitis 10/23/2017   Chronic heel pain, left 10/14/2017   Anejaculation  08/09/2017   Chronic gout 05/22/2017   Dyspnea 11/18/2016   BPH (benign prostatic hyperplasia) 10/05/2016   Status post liver transplant ([place]) 03/15/2016   Portal hypertensive gastropathy ([place]) 12/04/2015   Type 2 diabetes mellitus with other specified complication ([place]) 61/60/7371   Gynecomastia    CKD (chronic kidney disease) stage 3, GFR 30-59 ml/min (HCC) 11/04/2015   Depression, major, single episode, moderate ([place]) 07/09/2015   Headache 02/19/2015   Insomnia 12/06/2014   Hypomagnesemia    Other fatigue 11/18/2014   Internal hemorrhoid 10/01/2014   Erectile dysfunction 12/21/9483   Alcoholic cirrhosis of liver without ascites ([place]) 07/28/2014   (HFpEF) heart failure with preserved ejection fraction ([place]) 11/01/2013   Essential hypertension 06/04/2013   OSA on CPAP 07/11/2012   History of alcohol dependence ([place]) 07/11/2012   Thrombocytopenia ([place]) 12/15/2011   Morbid obesity with BMI of 40.0-44.9, adult ([place]) 11/16/2011   Routine general medical examination at a health care facility 09/22/2010   NEUROPATHY 05/27/2009   Pedal edema 09/22/2008   Dyslipidemia associated with type 2 diabetes mellitus ([place]) 02/15/2007   Allergic rhinitis 02/15/2007   GERD 02/15/2007    Immunization History  Administered Date(s) Administered   Hep A / Hep B 01/16/2015, 01/23/2015, 02/06/2015   Hepatitis B, adult 12/04/2015   Influenza Whole 03/24/2010   Influenza,inj,Quad PF,6+ Mos 04/22/2015, 04/06/2017, 04/23/2018, 04/23/2019, 04/07/2020, 06/22/2021   PFIZER(Purple Top)SARS-COV-2 Vaccination 11/07/2019, 11/28/2019, 05/07/2020, 12/04/2020   PNEUMOCOCCAL CONJUGATE-20 02/15/2021   Pneumococcal Polysaccharide-23 04/22/2015   Td 05/01/2007   Tdap 12/14/2015, 10/05/2020    Conditions to be addressed/monitored:  Hypertension, Hyperlipidemia, and Diabetes  Care Plan : [place]  Updates made by Charlton Haws, [place] since 08/31/2021 12:00 AM     Problem: Hypertension,  Hyperlipidemia, and Diabetes   Priority: High     Long-Range Goal: Disease Management   Start  Date: 03/02/2021  Expected End Date: 07/30/2022  This Visit's Progress: Not on track  Recent Progress: Not on track  Priority: High  Note:   Current Barriers:  Unable to achieve control of diabetes  Pharmacist Clinical Goal(s):  Patient will contact provider office for questions/concerns as evidenced notation of same in electronic health record through collaboration with PharmD and provider.   Interventions: 1:1 collaboration with Ria Bush, MD regarding development and update of comprehensive plan of care as evidenced by provider attestation and co-signature Inter-disciplinary care team collaboration (see longitudinal plan of care) Comprehensive medication review performed; medication list updated in electronic medical record  Hypertension / HFpEF (BP goal <140/90) -Controlled - BP at goal; Denies hypotensive/hypertensive symptoms -Current treatment: Losartan 50 mg daily - Appropriate, Effective, Safe, Accessible Furosemide 40 mg daily - Appropriate, Effective, Safe, Accessible -Educated on BP goals and benefits of medications for prevention of heart attack, stroke and kidney damage; -Counseled to monitor BP at home periodically -Recommended to continue current medication   Hyperlipidemia: (LDL goal < 100) -Controlled - refills timely, LDL 38 but TG elevated above goal secondary to DM. -Current treatment: Lovastatin 20 mg daily HS - Appropriate, Effective, Safe, Accessible -Medications previously tried: none  -Recommended to continue current medication  Diabetes (A1c goal <8%) -Uncontrolled - A1c worsened 8.6> 9.3% 05/2021; Freestyle Libre was denied, [employer] requires 3 insulin injections per day to qualify; BG has slightly improved by splitting Lantus dose BID to improve absorption, though still above goal -Current home glucose readings  fasting glucose: 150-210 post prandial  glucose: n/a -Current medications: Jardiance 25 mg daily - Appropriate, Effective, Safe, Accessible (PAN) Lantus Solostar 50 units BID-Appropriate, Query Effective Metformin XR 500 mg daily -Appropriate, Effective, Safe, Accessible -Medications previously tried: Trulicity (diarrhea, cost) -Can consider re-trial of Trulicity or Rybelsus at some point -Recommended to increase Lantus to 55 units BID (10% increase)  Health Maintenance -Vaccine gaps: Covid booster, Shingrix -Prostate cancer Dx 02/2021, undergoing radiation  Patient Goals/Self-Care Activities Patient will:  - take medications as prescribed as evidenced by patient report and record review focus on medication adherence by routine check glucose twice daily, document, and provide at future appointments engage in dietary modifications by limiting carbs, sugar    Medication Assistance:  Lantus ([employer] PAP) Jardiance (PAN foundation) - approved through 07/25/22  Member ID: [id number] Group ID: [id number] RX BIN: [id number] PCN: [id number]  Compliance/Adherence/Medication fill history: Care Gaps: Eye exam due   Star rating drugs: Metformin XR 537m - PDC 96% Losartan 539m- PDC 100% Lovastatin 2027m PDC 100% Jardiance - PDC 41%; LF 07/27/21  Patient's preferred pharmacy is:  [id number] [place] [address]: [phone]: [phone] - [place] [address]: [phone]: [phone]/pharmacy [id number] [place] [id number]: [phone] Plan and Follow Up Patient Decision:  Patient agrees to Care Plan and Follow-up.  Follow Up Plan: Telephone follow up appointment with care management  team member scheduled for: 1 month  LindsCharlene BrookermD, BCACP Clinical Pharmacist [phone]

## 2021-08-31 NOTE — Patient Instructions (Signed)
Visit Information ? ?Phone number for Pharmacist: [phone] ? ? Goals Addressed   ? ?  ?  ?  ?  ? This Visit's Progress  ?  Monitor and Manage My Blood Sugar-Diabetes Type 2     ?  Timeframe:  Long-Range Goal ?Priority:  Medium ?Start Date:      07/30/21                       ?Expected End Date:    07/30/22                  ? ?Follow Up Date April 2023 ?  ?- check blood sugar at prescribed times ?- check blood sugar if I feel it is too high or too low ?- enter blood sugar readings and medication or insulin into daily log ?- take the blood sugar meter to all doctor visits  ?  ?Why is this important?   ?Checking your blood sugar at home helps to keep it from getting very high or very low.  ?Writing the results in a diary or log helps the doctor know how to care for you.  ?Your blood sugar log should have the time, date and the results.  ?Also, write down the amount of insulin or other medicine that you take.  ?Other information, like what you ate, exercise done and how you were feeling, will also be helpful.   ?  ?Notes:  ?  ? ?  ? ? ?Care Plan : [place]  ?Updates made by Charlton Haws, RPH since 08/31/2021 12:00 AM  ?  ? ?Problem: Hypertension, Hyperlipidemia, and Diabetes   ?Priority: High  ?  ? ?Long-Range Goal: Disease Management   ?Start Date: 03/02/2021  ?Expected End Date: 07/30/2022  ?This Visit's Progress: Not on track  ?Recent Progress: Not on track  ?Priority: High  ?Note:   ?Current Barriers:  ?Unable to achieve control of diabetes ? ?Pharmacist Clinical Goal(s):  ?Patient will contact provider office for questions/concerns as evidenced notation of same in electronic health record through collaboration with PharmD and provider.  ? ?Interventions: ?1:1 collaboration with Ria Bush, MD regarding development and update of comprehensive plan of care as evidenced by provider attestation and co-signature ?Inter-disciplinary care team collaboration (see longitudinal plan of  care) ?Comprehensive medication review performed; medication list updated in electronic medical record ? ?Hypertension / HFpEF (BP goal <140/90) ?-Controlled - BP at goal; Denies hypotensive/hypertensive symptoms ?-Current treatment: ?Losartan 50 mg daily - Appropriate, Effective, Safe, Accessible ?Furosemide 40 mg daily - Appropriate, Effective, Safe, Accessible ?-Educated on BP goals and benefits of medications for prevention of heart attack, stroke and kidney damage; ?-Counseled to monitor BP at home periodically ?-Recommended to continue current medication  ? ?Hyperlipidemia: (LDL goal < 100) ?-Controlled - refills timely, LDL 38 but TG elevated above goal secondary to DM. ?-Current treatment: ?Lovastatin 20 mg daily HS - Appropriate, Effective, Safe, Accessible ?-Medications previously tried: none  ?-Recommended to continue current medication ? ?Diabetes (A1c goal <8%) ?-Uncontrolled - A1c worsened 8.6> 9.3% 05/2021; Freestyle Elenor Legato was denied, Human a requires 3 insulin injections per day to qualify; BG has slightly improved by splitting Lantus dose BID to improve absorption, though still above goal ?-Current home glucose readings  ?fasting glucose: 150-210 ?post prandial glucose: n/a ?-Current medications: ?Jardiance 25 mg daily - Appropriate, Effective, Safe, Accessible (PAN) ?Lantus Solostar 50 units BID-Appropriate, Query Effective ?Metformin XR 500 mg daily -Appropriate, Effective, Safe, Accessible ?-Medications  previously tried: Trulicity (diarrhea, cost) ?-Can consider re-trial of Trulicity or Rybelsus at some point ?-Recommended to increase Lantus to 55 units BID (10% increase) ? ?Health Maintenance ?-Vaccine gaps: Covid booster, Shingrix ?-Prostate cancer Dx 02/2021, undergoing radiation ? ?Patient Goals/Self-Care Activities ?Patient will:  ?- take medications as prescribed as evidenced by patient report and record review ?focus on medication adherence by routine ?check glucose twice daily, document,  and provide at future appointments ?engage in dietary modifications by limiting carbs, sugar ?  ?  ? ?Patient verbalizes understanding of instructions and care plan provided today and agrees to view in [employer]. Active MyChart status confirmed with patient.   ?Telephone follow up appointment with pharmacy team member scheduled for: 1 month ? ?Charlene Brooke, PharmD, BCACP ?Clinical Pharmacist ?[facility] at [facility] ?[phone] ?  ?

## 2021-09-07 DIAGNOSIS — Z79899 Other long term (current) drug therapy: Secondary | ICD-10-CM | POA: Diagnosis not present

## 2021-09-07 DIAGNOSIS — Z4823 Encounter for aftercare following liver transplant: Secondary | ICD-10-CM | POA: Diagnosis not present

## 2021-09-07 DIAGNOSIS — Z944 Liver transplant status: Secondary | ICD-10-CM | POA: Diagnosis not present

## 2021-09-08 ENCOUNTER — Other Ambulatory Visit: Payer: Self-pay | Admitting: Family Medicine

## 2021-09-09 MED ORDER — FLUTICASONE PROPIONATE 50 MCG/ACT NA SUSP: 2.0000 | Freq: Every day | NASAL | 5 refills | Status: DC

## 2021-09-22 ENCOUNTER — Telehealth: Payer: Self-pay

## 2021-09-22 NOTE — Progress Notes (Signed)
? ? ?Chronic Care Management ?Pharmacy Assistant  ? ?Name: Colton Porter  MRN: [id number] DOB: [birth year] ? ?Reason for Encounter: CCM Counsellor) ?  ?Medications: ?Outpatient Encounter Medications as of 09/22/2021  ?Medication Sig  ? Accu-Chek FastClix Lancets MISC USE THREE TIMES DAILY TO CHECK SUGARS E65.11, INSULIN USE  ? ACCU-CHEK GUIDE test strip USE AS INSTRUCTED TO CHECK SUGARS THREE TIMES DAILY E11.65, INSULIN USE  ? acetaminophen (TYLENOL) 500 MG tablet Take 500 mg by mouth every 6 (six) hours as needed for mild pain.  ? allopurinol (ZYLOPRIM) 300 MG tablet TAKE 1 TABLET EVERY DAY  ? ASPIR-LOW 81 MG EC tablet Take 1 tablet (81 mg total) by mouth daily.  ? B-D UF III MINI PEN NEEDLES 31G X 5 MM MISC USE TO INJECT INSULIN DAILY  ? Blood Glucose Monitoring Suppl (ACCU-CHEK GUIDE) w/Device KIT 1 Units by Does not apply route as directed.  ? Cholecalciferol (VITAMIN D3) 2000 units TABS Take 1 tablet by mouth at bedtime.  ? Continuous Blood Gluc Sensor (FREESTYLE LIBRE 2 SENSOR) MISC Apply sensor every 14 days to monitor sugar continously  ? docusate sodium (COLACE) 100 MG capsule Take 100 mg by mouth daily as needed for mild constipation.   ? empagliflozin (JARDIANCE) 25 MG TABS tablet Take 1 tablet (25 mg total) by mouth daily before breakfast.  ? escitalopram (LEXAPRO) 10 MG tablet TAKE 1 TABLET EVERY DAY  ? fluticasone (FLONASE) 50 MCG/ACT nasal spray Place 2 sprays into both nostrils daily.  ? furosemide (LASIX) 40 MG tablet Take 1 tablet (40 mg total) by mouth daily.  ? glucose blood (ACCU-CHEK GUIDE) test strip Check sugars three times daily and as needed when feeling ill E11.65, insulin use  ? insulin glargine (LANTUS SOLOSTAR) 100 UNIT/ML Solostar Pen Inject 100 Units into the skin every morning. And pen needles 1/day (Patient taking differently: Inject 55 Units into the skin 2 (two) times daily. And pen needles 1/day)  ? loratadine (CLARITIN) 10 MG tablet Take 10 mg by mouth daily.  ?  losartan (COZAAR) 50 MG tablet TAKE 1 TABLET EVERY DAY  ? lovastatin (MEVACOR) 20 MG tablet TAKE 1 TABLET (20 MG TOTAL) BY MOUTH AT BEDTIME.  ? magnesium oxide (MAG-OX) 400 (240 Mg) MG tablet TAKE 1 TABLET BY MOUTH TWICE A DAY  ? metFORMIN (GLUCOPHAGE-XR) 500 MG 24 hr tablet TAKE 1 TABLET BY MOUTH EVERY DAY WITH BREAKFAST  ? methocarbamol (ROBAXIN) 500 MG tablet Take 1 tablet (500 mg total) by mouth 3 (three) times daily as needed for muscle spasms (sedation precautions).  ? Multiple Vitamins-Minerals ([employer] SPECTRAVITE PO) Take 1 tablet by mouth at bedtime.  ? mycophenolate (MYFORTIC) 360 MG TBEC EC tablet Take 360 mg by mouth 2 (two) times daily.  ? Omega-3 Fatty Acids (FISH OIL PO) Take 1 tablet by mouth at bedtime.  ? omeprazole (PRILOSEC) 40 MG capsule Take 1 capsule (40 mg total) by mouth daily.  ? oxyCODONE (ROXICODONE) 5 MG immediate release tablet 1-2 tabs PO q6 hours prn pain  ? tacrolimus (PROGRAF) 1 MG capsule Take 1-2 mg by mouth See admin instructions. Take 2 mg by mouth in the morning, then take 1 mg  by mouth in the evening  ? ?No facility-administered encounter medications on file as of 09/22/2021.  ? ?Colton Porter was contacted to remind of upcoming telephone visit with Colton Porter on 09/27/2021 at 9:30. Patient was reminded to have any blood glucose and blood pressure readings available for review at appointment.  ? ?  Message was left reminding patient of appointment. ? ?Star Rating Drugs: ?Medication:  Last Fill: Day Supply ?Metformin 500 mg 07/26/2021 90 ?Losartan 50 mg 08/19/2021 90  ?Lovastatin 20 mg 07/06/2021 90 ?Jardiance  07/27/2021 90 ? ?Colton Porter, CPP notified ? ?Colton Porter, RMA ?Clinical Pharmacy Assistant ?[phone] ? ?  ? ? ? ? ?

## 2021-09-24 DIAGNOSIS — I110 Hypertensive heart disease with heart failure: Secondary | ICD-10-CM

## 2021-09-24 DIAGNOSIS — Z7984 Long term (current) use of oral hypoglycemic drugs: Secondary | ICD-10-CM

## 2021-09-24 DIAGNOSIS — E1159 Type 2 diabetes mellitus with other circulatory complications: Secondary | ICD-10-CM | POA: Diagnosis not present

## 2021-09-24 DIAGNOSIS — Z794 Long term (current) use of insulin: Secondary | ICD-10-CM

## 2021-09-24 DIAGNOSIS — I5030 Unspecified diastolic (congestive) heart failure: Secondary | ICD-10-CM

## 2021-09-24 DIAGNOSIS — E785 Hyperlipidemia, unspecified: Secondary | ICD-10-CM

## 2021-09-27 ENCOUNTER — Telehealth: Payer: Medicare HMO

## 2021-09-27 ENCOUNTER — Telehealth: Payer: Self-pay

## 2021-09-27 NOTE — Chronic Care Management (AMB) (Signed)
Contacted the patient to reschedule todays appointment, per CPP. Rescheduled for  4/27 at 2:15 pm -patient agreed  ? ?Charlene Brooke, CPP notified ? ?Pristine Gladhill, CCMA ?Health concierge  ?[phone]  ?

## 2021-10-18 ENCOUNTER — Telehealth: Payer: Self-pay

## 2021-10-18 NOTE — Progress Notes (Signed)
? ? ?Chronic Care Management ?Pharmacy Assistant  ? ?Name: Colton Porter  MRN: [id number] DOB: 09/28/1965 ? ?Reason for Encounter: CCM Counsellor) ?  ?Medications: ?Outpatient Encounter Medications as of 10/18/2021  ?Medication Sig  ? Accu-Chek FastClix Lancets MISC USE THREE TIMES DAILY TO CHECK SUGARS E65.11, INSULIN USE  ? ACCU-CHEK GUIDE test strip USE AS INSTRUCTED TO CHECK SUGARS THREE TIMES DAILY E11.65, INSULIN USE  ? acetaminophen (TYLENOL) 500 MG tablet Take 500 mg by mouth every 6 (six) hours as needed for mild pain.  ? allopurinol (ZYLOPRIM) 300 MG tablet TAKE 1 TABLET EVERY DAY  ? ASPIR-LOW 81 MG EC tablet Take 1 tablet (81 mg total) by mouth daily.  ? B-D UF III MINI PEN NEEDLES 31G X 5 MM MISC USE TO INJECT INSULIN DAILY  ? Blood Glucose Monitoring Suppl (ACCU-CHEK GUIDE) w/Device KIT 1 Units by Does not apply route as directed.  ? Cholecalciferol (VITAMIN D3) 2000 units TABS Take 1 tablet by mouth at bedtime.  ? Continuous Blood Gluc Sensor (FREESTYLE LIBRE 2 SENSOR) MISC Apply sensor every 14 days to monitor sugar continously  ? docusate sodium (COLACE) 100 MG capsule Take 100 mg by mouth daily as needed for mild constipation.   ? empagliflozin (JARDIANCE) 25 MG TABS tablet Take 1 tablet (25 mg total) by mouth daily before breakfast.  ? escitalopram (LEXAPRO) 10 MG tablet TAKE 1 TABLET EVERY DAY  ? fluticasone (FLONASE) 50 MCG/ACT nasal spray Place 2 sprays into both nostrils daily.  ? furosemide (LASIX) 40 MG tablet Take 1 tablet (40 mg total) by mouth daily.  ? glucose blood (ACCU-CHEK GUIDE) test strip Check sugars three times daily and as needed when feeling ill E11.65, insulin use  ? insulin glargine (LANTUS SOLOSTAR) 100 UNIT/ML Solostar Pen Inject 100 Units into the skin every morning. And pen needles 1/day (Patient taking differently: Inject 55 Units into the skin 2 (two) times daily. And pen needles 1/day)  ? loratadine (CLARITIN) 10 MG tablet Take 10 mg by mouth daily.  ?  losartan (COZAAR) 50 MG tablet TAKE 1 TABLET EVERY DAY  ? lovastatin (MEVACOR) 20 MG tablet TAKE 1 TABLET (20 MG TOTAL) BY MOUTH AT BEDTIME.  ? magnesium oxide (MAG-OX) 400 (240 Mg) MG tablet TAKE 1 TABLET BY MOUTH TWICE A DAY  ? metFORMIN (GLUCOPHAGE-XR) 500 MG 24 hr tablet TAKE 1 TABLET BY MOUTH EVERY DAY WITH BREAKFAST  ? methocarbamol (ROBAXIN) 500 MG tablet Take 1 tablet (500 mg total) by mouth 3 (three) times daily as needed for muscle spasms (sedation precautions).  ? Multiple Vitamins-Minerals ([employer]S SPECTRAVITE PO) Take 1 tablet by mouth at bedtime.  ? mycophenolate (MYFORTIC) 360 MG TBEC EC tablet Take 360 mg by mouth 2 (two) times daily.  ? Omega-3 Fatty Acids (FISH OIL PO) Take 1 tablet by mouth at bedtime.  ? omeprazole (PRILOSEC) 40 MG capsule Take 1 capsule (40 mg total) by mouth daily.  ? oxyCODONE (ROXICODONE) 5 MG immediate release tablet 1-2 tabs PO q6 hours prn pain  ? tacrolimus (PROGRAF) 1 MG capsule Take 1-2 mg by mouth See admin instructions. Take 2 mg by mouth in the morning, then take 1 mg  by mouth in the evening  ? ?No facility-administered encounter medications on file as of 10/18/2021.  ? ?Colton Porter was contacted to remind of upcoming telephone visit with Colton Porter on 10/21/2021 at 2:15. Patient was reminded to have any blood glucose and blood pressure readings available for review at appointment.  ? ?  Message was left reminding patient of appointment. ? ?Star Rating Drugs: ?Medication:  Last Fill: Day Supply ?Metformin 500 mg 07/26/2021      90 ?Losartan 50 mg 08/19/2021      90  ?Lovastatin 20 mg 07/06/2021      90 ?Jardiance 25 mg 07/27/2021      90 ? ?Colton Porter, CPP notified ? ?Colton Porter, RMA ?Clinical Pharmacy Assistant ?[phone] ? ? ? ? ?

## 2021-10-21 ENCOUNTER — Ambulatory Visit (INDEPENDENT_AMBULATORY_CARE_PROVIDER_SITE_OTHER): Payer: Medicare HMO | Admitting: Pharmacist

## 2021-10-21 DIAGNOSIS — I10 Essential (primary) hypertension: Secondary | ICD-10-CM

## 2021-10-21 DIAGNOSIS — E1169 Type 2 diabetes mellitus with other specified complication: Secondary | ICD-10-CM

## 2021-10-21 NOTE — Progress Notes (Signed)
? ?Chronic Care Management ?Pharmacy Note ? ?10/21/2021 ?Name:  Colton Porter MRN:  [id number] DOB:  05/02/1966 ? ?Summary: CCM F/U visit ?-A1c 9.3% (05/2021), pt is compliant with metformin XR 500 mg/day, Jardiance 25 mg, Lantus 55u BID, and fasting BG is 180-200s. He is at/above maximum effective dose of basal insulin (0.94 u/kg/day). ?-Discussed benefits of CGM for diet changes. Previously CGM was denied by insurance however [employer] has since updated their requirement to only 1 insulin injection per day. ? ?Recommendations/Changes made from today's visit: ?-Advised to increase metformin XR to 500 mg BID. PCP F/u next week - consider addition of Rybelsus/Ozempic (previously tried Trulicity and had diarrhea) ?-Ordered Freestyle Libre 2 to [employer] via [facility] ? ?Plan: ?-Pharmacist follow up televisit scheduled for 2 months ?-PCP 63-month f/u 10/27/21 ? ? ?Subjective: ?Colton Porter is an 56 y.o. year old male who is a primary patient of Ria Bush, MD.  The CCM team was consulted for assistance with disease management and care coordination needs.   ? ?Engaged with patient by telephone for follow up visit in response to provider referral for pharmacy case management and/or care coordination services.  ? ?Consent to Services:  ?The patient was given information about Chronic Care Management services, agreed to services, and gave verbal consent prior to initiation of services.  Please see initial visit note for detailed documentation.  ? ?Patient Care Team: ?Ria Bush, MD as PCP - General (Family Medicine) ?Freada Bergeron, MD as PCP - Cardiology (Cardiology) ?Gerldine Suleiman, Cleaster Corin, [facility] as Pharmacist (Pharmacist) ? ?Recent office visits: ?06/22/21 Dr [name] OV: f/u DM. A1c 8.6 > 9.3%; increased Jardiance to 25 mg. RTC 2 weeks for BMP on higher Jardiance dose ?04/29/21 - PCP message - Back pain, trial robaxin PRN ?  ?Recent consult visits:  ?04/28/21 - Urology - Procedural  visit ?03/23/21 - Urology - Prostate cancer follow up, referred to radiation oncology. Start Eligard.  ?03/22/21 - Endocrinology - A1c improved, 8.6%. Follow up with PCP. ?  ?Hospital visits:  ?None in previous 6 months ? ?Objective: ? ?Lab Results  ?Component Value Date  ? CREATININE 1.31 (H) 04/02/2021  ? BUN 14 04/02/2021  ? GFR 68.61 02/08/2021  ? GFRNONAA >60 08/02/2019  ? GFRAA >60 08/02/2019  ? NA 140 04/02/2021  ? K 4.1 04/02/2021  ? CALCIUM 9.2 04/02/2021  ? CO2 26 04/02/2021  ? GLUCOSE 138 (H) 04/02/2021  ? ?Lab Results  ?Component Value Date/Time  ? HGBA1C 9.3 (A) 06/22/2021 09:12 AM  ? HGBA1C 8.6 (A) 03/22/2021 08:40 AM  ? HGBA1C 11.5 (H) 02/08/2021 08:02 AM  ? HGBA1C 10.6 (H) 01/22/2020 07:28 AM  ? FRUCTOSAMINE 436 (H) 04/22/2020 08:35 AM  ? FRUCTOSAMINE 232 08/02/2017 08:26 AM  ? GFR 68.61 02/08/2021 08:02 AM  ? GFR 70.48 04/07/2020 08:39 AM  ? MICROALBUR <0.7 08/27/2014 09:51 AM  ?  ?Last diabetic Eye exam:  ?Lab Results  ?Component Value Date/Time  ? HMDIABEYEEXA No Retinopathy 03/20/2020 12:00 AM  ?  ? ?Lab Results  ?Component Value Date  ? CHOL 98 02/08/2021  ? HDL 26.50 (L) 02/08/2021  ? [place] 44 10/06/2017  ? LDLDIRECT 38.0 02/08/2021  ? TRIG (H) 02/08/2021  ?  409.0 Triglyceride is over 400; calculations on Lipids are invalid.  ? CHOLHDL 4 02/08/2021  ? ? ? ?  Latest Ref Rng & Units 04/02/2021  ?  4:45 PM 02/08/2021  ?  8:02 AM 04/07/2020  ?  8:39 AM  ?Hepatic Function  ?  Total Protein 6.1 - 8.1 g/dL 7.4   6.9     ?Albumin 3.5 - 5.2 g/dL  3.9   4.1    ?AST 10 - 35 U/L 16   14     ?ALT 9 - 46 U/L 21   21     ?Alk Phosphatase 39 - 117 U/L  102     ?Total Bilirubin 0.2 - 1.2 mg/dL 0.6   0.5     ? ? ?Lab Results  ?Component Value Date/Time  ? TSH 1.10 04/07/2020 08:39 AM  ? TSH 0.716 07/18/2019 11:33 AM  ? TSH 1.47 10/03/2016 08:17 AM  ? FREET4 0.91 11/01/2013 03:06 PM  ? FREET4 0.9 05/27/2009 10:50 AM  ? ? ? ?  Latest Ref Rng & Units 05/26/2021  ?  3:35 PM 04/02/2021  ?  4:45 PM 02/08/2021  ?  8:02 AM   ?CBC  ?WBC 4.0 - 10.5 K/uL 7.0   7.9   6.6    ?Hemoglobin 13.0 - 17.0 g/dL 13.1   13.8   13.0    ?Hematocrit 39.0 - 52.0 % 39.5   42.2   38.8    ?Platelets 150 - 400 K/uL 162   182   143.0    ? ? ?Lab Results  ?Component Value Date/Time  ? VD25OH 43.81 02/08/2021 08:02 AM  ? VD25OH 38.35 01/22/2020 07:28 AM  ? ? ?Clinical ASCVD: No  ?The ASCVD Risk score (Arnett DK, et al., 2019) failed to calculate for the following reasons: ?  The valid total cholesterol range is 130 to 320 mg/dL   ? ? ?  02/15/2021  ?  9:33 AM 01/22/2020  ?  3:30 PM 10/15/2018  ?  8:53 AM  ?Depression screen PHQ 2/9  ?Decreased Interest 0 0 0  ?Down, Depressed, Hopeless 0 0 0  ?PHQ - 2 Score 0 0 0  ?Altered sleeping 2 0 0  ?Tired, decreased energy 2 0 0  ?Change in appetite 1 0 0  ?Feeling bad or failure about yourself  0 0 0  ?Trouble concentrating 0 0 0  ?Moving slowly or fidgety/restless 0 0 0  ?Suicidal thoughts 0 0 0  ?PHQ-9 Score 5 0 0  ?Difficult doing work/chores  Not difficult at all Not difficult at all  ?  ?Social History  ? ?Tobacco Use  ?Smoking Status Never  ?Smokeless Tobacco Former  ? Types: Chew  ? ?BP Readings from Last 3 Encounters:  ?08/12/21 118/72  ?06/22/21 140/68  ?04/28/21 131/82  ? ?Pulse Readings from Last 3 Encounters:  ?08/12/21 85  ?06/22/21 86  ?04/28/21 79  ? ?Wt Readings from Last 3 Encounters:  ?08/12/21 256 lb 8 oz (116.3 kg)  ?06/22/21 260 lb 4 oz (118 kg)  ?04/28/21 256 lb (116.1 kg)  ? ?BMI Readings from Last 3 Encounters:  ?08/12/21 40.78 kg/m?  ?06/22/21 41.38 kg/m?  ?04/28/21 40.70 kg/m?  ? ? ?Assessment/Interventions: Review of patient past medical history, allergies, medications, health status, including review of consultants reports, laboratory and other test data, was performed as part of comprehensive evaluation and provision of chronic care management services.  ? ?SDOH:  (Social Determinants of Health) assessments and interventions performed: Yes ? ? ? ?SDOH Screenings  ? ?Alcohol Screen: Not on file   ?Depression (PHQ2-9): Medium Risk  ? PHQ-2 Score: 5  ?Financial Resource Strain: Low Risk   ? Difficulty of Paying Living Expenses: Not very hard  ?Food Insecurity: Not on file  ?Housing: Not on file  ?  Physical Activity: Not on file  ?Social Connections: Not on file  ?Stress: Not on file  ?Tobacco Use: Medium Risk  ? Smoking Tobacco Use: Never  ? Smokeless Tobacco Use: Former  ? Passive Exposure: Not on file  ?Transportation Needs: Not on file  ? ? ?Angie ? ?Allergies  ?Allergen Reactions  ? Tolmetin Rash  ?  cirrhosis  ? Ambien [Zolpidem Tartrate] Other (See Comments)  ?  Over-toxicity from liver failure  ? Morphine And Related   ?  Hallucinations.  ? Trulicity [Dulaglutide]   ?  frequency  ? Hydrochlorothiazide W-Triamterene Other (See Comments)  ?  REACTION: dizzy, nausea  ? Lisinopril Other (See Comments)  ?  REACTION: cough, decreased libido  ? ? ?Medications Reviewed Today   ? ? Reviewed by Charlton Haws, [facility] (Pharmacist) on 10/21/21 at 1444  Med List Status: <None>  ? ?Medication Order Taking? Sig Documenting Provider Last Dose Status Informant  ?Accu-Chek FastClix Lancets MISC [id number] Yes USE THREE TIMES DAILY TO CHECK SUGARS E65.11, INSULIN USE Ria Bush, MD Taking Active   ?ACCU-CHEK GUIDE test strip [id number] Yes USE AS INSTRUCTED TO CHECK SUGARS THREE TIMES DAILY E11.65, INSULIN USE Ria Bush, MD Taking Active   ?acetaminophen (TYLENOL) 500 MG tablet [id number] Yes Take 500 mg by mouth every 6 (six) hours as needed for mild pain. [provider] Taking Active Spouse/Significant Other  ?allopurinol (ZYLOPRIM) 300 MG tablet [id number] Yes TAKE 1 TABLET EVERY DAY Ria Bush, MD Taking Active   ?ASPIR-LOW 81 MG EC tablet [id number] Yes Take 1 tablet (81 mg total) by mouth daily. Ria Bush, MD Taking Active   ?B-D UF III MINI PEN NEEDLES 31G X 5 MM MISC [id number] Yes USE TO INJECT INSULIN DAILY Ria Bush, MD Taking Active   ?Blood Glucose  Monitoring Suppl (ACCU-CHEK GUIDE) w/Device KIT [id number] Yes 1 Units by Does not apply route as directed. Ria Bush, MD Taking Active Spouse/Significant Other  ?Cholecalciferol (VITAMIN D3) 2000 units TABS [id number]

## 2021-10-21 NOTE — Patient Instructions (Signed)
Visit Information ? ?Phone number for Pharmacist: [phone] ? ? Goals Addressed   ?None ?  ? ? ?Care Plan : [place]  ?Updates made by Charlton Haws, [place] since 10/21/2021 12:00 AM  ?  ? ?Problem: Hypertension, Hyperlipidemia, and Diabetes   ?Priority: High  ?  ? ?Long-Range Goal: Disease Management   ?Start Date: 03/02/2021  ?Expected End Date: 07/30/2022  ?This Visit's Progress: Not on track  ?Recent Progress: Not on track  ?Priority: High  ?Note:   ?Current Barriers:  ?Unable to achieve control of diabetes ? ?Pharmacist Clinical Goal(s):  ?Patient will contact provider office for questions/concerns as evidenced notation of same in electronic health record through collaboration with PharmD and provider.  ? ?Interventions: ?1:1 collaboration with Ria Bush, MD regarding development and update of comprehensive plan of care as evidenced by provider attestation and co-signature ?Inter-disciplinary care team collaboration (see longitudinal plan of care) ?Comprehensive medication review performed; medication list updated in electronic medical record ? ?Hypertension / HFpEF (BP goal <140/90) ?-Controlled - BP at goal; Denies hypotensive/hypertensive symptoms ?-Current treatment: ?Losartan 50 mg daily - Appropriate, Effective, Safe, Accessible ?Furosemide 40 mg daily - Appropriate, Effective, Safe, Accessible ?-Educated on BP goals and benefits of medications for prevention of heart attack, stroke and kidney damage; ?-Counseled to monitor BP at home periodically ?-Recommended to continue current medication  ? ?Hyperlipidemia: (LDL goal < 100) ?-Controlled - statin refills timely, LDL 38 (01/2021); but TG elevated above goal secondary to DM. ?-Current treatment: ?Lovastatin 20 mg daily HS - Appropriate, Effective, Safe, Accessible ?-Medications previously tried: none  ?-Recommended to continue current medication ? ?Diabetes (A1c goal <8%) ?-Uncontrolled - A1c worsened 8.6> 9.3% 05/2021; fasting BG  has not improved much after recent insulin dose increase (now taking 110 units per day) ?-Current home glucose readings  ?fasting glucose: 182, 201 ?post prandial glucose: 181 ?1 episode of sweating, shaking while driving ?-Current medications: ?Jardiance 25 mg daily - Appropriate, Effective, Safe, Accessible (PAN) ?Lantus Solostar 50 units BID-Appropriate, Query Effective ?Metformin XR 500 mg daily -Appropriate, Effective, Safe, Accessible ?-Medications previously tried: Trulicity (diarrhea, cost) ?-Pt is taking 0.94 u/kg/day of basal insulin, would consider this over-basalization and further increasing the dose will probably not help with BG; at this point either GLP-1 agonist or prandial insulin is recommended next ?-Discussed CGM - prevously FL2 was denied, however [employer] has updated their criteria for CGM to require only 1 insulin injection per day ?-Recommend to increase metformin to 500 mg BID; consider addition of Rybelsus/Ozempic if A1c > 8% at PCP visit next week ?-Re-ordered Freestyle Libre 2 to [facility] via [facility] ? 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 Follows w [facility] ([place], Dr Zollie Scale) ? ?Patient Goals/Self-Care Activities ?Patient will:  ?- take medications as prescribed as evidenced by patient report and record review ?focus on medication adherence by routine ?check glucose twice daily, document, and provide at future appointments ?engage in dietary modifications by limiting carbs, sugar ?  ?  ? ?Patient verbalizes understanding of instructions and care plan provided today and agrees to view in [facility] [place]. Active MyChart status confirmed with patient.   ?Telephone follow up appointment with pharmacy team member scheduled for: 2 months ? ?Charlene Brooke, PharmD, BCACP ?Clinical Pharmacist ?[facility] ?[phone] ?  ?

## 2021-10-22 ENCOUNTER — Other Ambulatory Visit: Payer: Medicare HMO

## 2021-10-22 DIAGNOSIS — C61 Malignant neoplasm of prostate: Secondary | ICD-10-CM

## 2021-10-23 LAB — PSA: Prostate Specific Ag, Serum: <0.1 ng/mL (ref 0.0–4.0)

## 2021-10-24 DIAGNOSIS — E785 Hyperlipidemia, unspecified: Secondary | ICD-10-CM | POA: Diagnosis not present

## 2021-10-24 DIAGNOSIS — E1159 Type 2 diabetes mellitus with other circulatory complications: Secondary | ICD-10-CM | POA: Diagnosis not present

## 2021-10-24 DIAGNOSIS — I10 Essential (primary) hypertension: Secondary | ICD-10-CM | POA: Diagnosis not present

## 2021-10-24 DIAGNOSIS — Z794 Long term (current) use of insulin: Secondary | ICD-10-CM

## 2021-10-25 NOTE — Progress Notes (Signed)
? ?10/26/2021 ?2:09 PM  ? ?[name] ?July 19, 1965 ?[id number] ? ?Referring provider:  ?Ria Bush, MD ?[address] ?[place],  [place] [id number] ?Chief Complaint  ?Patient presents with  ? Prostate Cancer  ? ? ? ?HPI: ?Colton Porter is a 56 y.o.male with a personal history of prostate cancer who presents today for a 6 month follow-up with PSA prior.  ? ?His prostate biopsy surgical pathology showed Gleason 3+3 and Gleason 3+4 as below, R >L involvement. ? ?He underwent prostate gold seed marker placement on 04/28/2021.  ? ?His most recent PSA was undetectable on 10/22/2021.  ? ?He reports urinary frequency that worsened after radiation.  He attributes this to the Lasix primarily.  Seems to be improving.  He gets up once at night to void. ? ?Today's his 72th wedding anniversary is accompanied by his wife.  They are pleased that he is cancer free. ? ? ?PMH: ?Past Medical History:  ?Diagnosis Date  ? Alcohol dependence ([place]) 07/11/2012  ? Alcoholic cirrhosis of liver with ascites ([place]) 07/2014  ? s./p transplant 12/2015  ? Allergy   ? Anemia   ? Cellulitis of left leg   ? Chronic diastolic heart failure ([facility]) 11/01/2013  ? CKD (chronic kidney disease) stage 3, GFR 30-59 ml/min (HCC) 11/04/2015  ? Diabetes mellitus without complication ([place])   ? Type II  ? GERD (gastroesophageal reflux disease)   ? Hyperlipidemia   ? Hypertension   ? Neuropathy   ? OSA on CPAP 07/11/2012  ? HST 07/2013:  AHI 39/hr.    ? Pneumonia due to COVID-19 virus 06/2019  ? Thrombocytopenia ([place]) 12/15/2011  ? ? ?Surgical History: ?Past Surgical History:  ?Procedure Laterality Date  ? COLONOSCOPY  08/2013  ? hyperplastic polyps, hemorrhoids Ardis Hughs)  ? ESOPHAGOGASTRODUODENOSCOPY  09/2014  ? 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 FINGER AMPUTATION  1997  ? left 4th finger - radial arm saw  ? I & D EXTREMITY Right 10/05/2020  ? Procedure: 1.  Irrigation debridement of right thumb open distal phalanx fracture 2.  Repair of right thumb  skin and nailbed laceration ;  Surgeon: Leanora Cover, MD;  Location: [facility];  Service: Orthopedics;  Laterality: Right;  ? LEFT AND RIGHT HEART CATHETERIZATION WITH CORONARY ANGIOGRAM N/A 06/10/2013  ? Procedure: LEFT AND RIGHT HEART CATHETERIZATION WITH CORONARY ANGIOGRAM;  Surgeon: Blane Ohara, MD;  Location: [facility] CATH LAB;  Service: Cardiovascular;  Laterality: N/A;  ? LIVER TRANSPLANTATION  12/2015  ? alcoholic cirrhosis ([facility] at [facility])  ? ? ?Home Medications:  ?Allergies as of 10/26/2021   ? ?   Reactions  ? Tolmetin Rash  ? cirrhosis  ? Ambien [zolpidem Tartrate] Other (See Comments)  ? Over-toxicity from liver failure  ? Morphine And Related   ? Hallucinations.  ? Trulicity [dulaglutide]   ? frequency  ? Hydrochlorothiazide W-triamterene Other (See Comments)  ? REACTION: dizzy, nausea  ? Lisinopril Other (See Comments)  ? REACTION: cough, decreased libido  ? ?  ? ?  ?Medication List  ?  ? ?  ? Accurate as of Oct 26, 2021  2:09 PM. If you have any questions, ask your nurse or doctor.  ?  ?  ? ?  ? ?Accu-Chek FastClix Lancets Misc ?USE THREE TIMES DAILY TO CHECK SUGARS E65.11, INSULIN USE ?  ?Accu-Chek Guide w/Device Kit ?1 Units by Does not apply route as directed. ?  ?acetaminophen 500 MG tablet ?Commonly known as: TYLENOL ?Take 500 mg by  mouth every 6 (six) hours as needed for mild pain. ?  ?allopurinol 300 MG tablet ?Commonly known as: ZYLOPRIM ?TAKE 1 TABLET EVERY DAY ?  ?Aspir-Low 81 MG EC tablet ?Generic drug: aspirin ?Take 1 tablet (81 mg total) by mouth daily. ?  ?B-D UF III MINI PEN NEEDLES 31G X 5 MM Misc ?Generic drug: Insulin Pen Needle ?USE TO INJECT INSULIN DAILY ?  ?CVS SPECTRAVITE PO ?Take 1 tablet by mouth at bedtime. ?  ?docusate sodium 100 MG capsule ?Commonly known as: COLACE ?Take 100 mg by mouth daily as needed for mild constipation. ?  ?empagliflozin 25 MG Tabs tablet ?Commonly known as: Jardiance ?Take 1 tablet (25 mg total) by mouth daily before breakfast. ?  ?escitalopram 10 MG  tablet ?Commonly known as: LEXAPRO ?TAKE 1 TABLET EVERY DAY ?  ?FISH OIL PO ?Take 1 tablet by mouth at bedtime. ?  ?fluticasone 50 MCG/ACT nasal spray ?Commonly known as: FLONASE ?Place 2 sprays into both nostrils daily. ?  ?FreeStyle Libre 2 Sensor Misc ?Apply sensor every 14 days to monitor sugar continously ?  ?furosemide 40 MG tablet ?Commonly known as: LASIX ?Take 1 tablet (40 mg total) by mouth daily. ?  ?glucose blood test strip ?Commonly known as: Accu-Chek Guide ?Check sugars three times daily and as needed when feeling ill E11.65, insulin use ?  ?Accu-Chek Guide test strip ?Generic drug: glucose blood ?USE AS INSTRUCTED TO CHECK SUGARS THREE TIMES DAILY E11.65, INSULIN USE ?  ?Lantus SoloStar 100 UNIT/ML Solostar Pen ?Generic drug: insulin glargine ?Inject 100 Units into the skin every morning. And pen needles 1/day ?What changed:  ?how much to take ?when to take this ?  ?loratadine 10 MG tablet ?Commonly known as: CLARITIN ?Take 10 mg by mouth daily. ?  ?losartan 50 MG tablet ?Commonly known as: COZAAR ?TAKE 1 TABLET EVERY DAY ?  ?lovastatin 20 MG tablet ?Commonly known as: MEVACOR ?TAKE 1 TABLET (20 MG TOTAL) BY MOUTH AT BEDTIME. ?  ?magnesium oxide 400 (240 Mg) MG tablet ?Commonly known as: MAG-OX ?TAKE 1 TABLET BY MOUTH TWICE A DAY ?  ?metFORMIN 500 MG 24 hr tablet ?Commonly known as: GLUCOPHAGE-XR ?TAKE 1 TABLET BY MOUTH EVERY DAY WITH BREAKFAST ?What changed: See the new instructions. ?  ?methocarbamol 500 MG tablet ?Commonly known as: Robaxin ?Take 1 tablet (500 mg total) by mouth 3 (three) times daily as needed for muscle spasms (sedation precautions). ?  ?mycophenolate 360 MG Tbec EC tablet ?Commonly known as: MYFORTIC ?Take 360 mg by mouth 2 (two) times daily. ?  ?omeprazole 40 MG capsule ?Commonly known as: PRILOSEC ?Take 1 capsule (40 mg total) by mouth daily. ?  ?oxyCODONE 5 MG immediate release tablet ?Commonly known as: Roxicodone ?1-2 tabs PO q6 hours prn pain ?  ?potassium chloride 10 MEQ  tablet ?Commonly known as: KLOR-CON M ?Take by mouth. ?  ?tacrolimus 1 MG capsule ?Commonly known as: PROGRAF ?Take 1-2 mg by mouth See admin instructions. Take 2 mg by mouth in the morning, then take 1 mg  by mouth in the evening ?  ?Vitamin D3 50 MCG (2000 UT) Tabs ?Take 1 tablet by mouth at bedtime. ?  ? ?  ? ? ?Allergies:  ?Allergies  ?Allergen Reactions  ? Tolmetin Rash  ?  cirrhosis  ? Ambien [Zolpidem Tartrate] Other (See Comments)  ?  Over-toxicity from liver failure  ? Morphine And Related   ?  Hallucinations.  ? Trulicity [Dulaglutide]   ?  frequency  ? Hydrochlorothiazide W-Triamterene  Other (See Comments)  ?  REACTION: dizzy, nausea  ? Lisinopril Other (See Comments)  ?  REACTION: cough, decreased libido  ? ? ?Family History: ?Family History  ?Problem Relation Age of Onset  ? Stroke Mother   ? Emphysema Mother   ? Hypertension Father   ? Heart disease Father   ? Emphysema Father   ? Lung cancer Maternal Aunt   ? Stroke Paternal Grandmother   ? Heart attack Neg Hx   ? Diabetes Neg Hx   ? ? ?Social History:  reports that he has never smoked. He has quit using smokeless tobacco.  His smokeless tobacco use included chew. He reports that he does not currently use drugs. He reports that he does not drink alcohol. ? ? ?Physical Exam: ?BP (!) 172/76   Pulse 87   Ht $R'5\' 6"'QI$  (1.676 m)   Wt 260 lb (117.9 kg)   BMI 41.97 kg/m?   ?Constitutional:  Alert and oriented, No acute distress. ?HEENT: [place] AT, moist mucus membranes.  Trachea midline, no masses. ?Cardiovascular: No clubbing, cyanosis, or edema. ?Respiratory: Normal respiratory effort, no increased work of breathing. ?Skin: No rashes, bruises or suspicious lesions. ?Neurologic: Grossly intact, no focal deficits, moving all 4 extremities. ?Psychiatric: Normal mood and affect. ? ?Laboratory Data: ?Lab Results  ?Component Value Date  ? CREATININE 1.31 (H) 04/02/2021  ? ?Lab Results  ?Component Value Date  ? HGBA1C 9.3 (A) 06/22/2021  ? ? ?Assessment & Plan:    ? ?Prostate cancer  ?- PSA undetectable, NED ?-No additional ADT at this time, has completed 64-month adjuvant treatment with his IMRT ?- Discussed common post ADT symptoms. Discussed how testosterone will incre

## 2021-10-26 ENCOUNTER — Ambulatory Visit: Payer: Medicare HMO | Admitting: Urology

## 2021-10-26 VITALS — BP 172/76 | HR 87 | Ht 66.0 in | Wt 260.0 lb

## 2021-10-26 DIAGNOSIS — R350 Frequency of micturition: Secondary | ICD-10-CM | POA: Diagnosis not present

## 2021-10-26 DIAGNOSIS — Z8546 Personal history of malignant neoplasm of prostate: Secondary | ICD-10-CM | POA: Diagnosis not present

## 2021-10-26 DIAGNOSIS — C61 Malignant neoplasm of prostate: Secondary | ICD-10-CM

## 2021-10-27 ENCOUNTER — Ambulatory Visit (INDEPENDENT_AMBULATORY_CARE_PROVIDER_SITE_OTHER): Payer: Medicare HMO | Admitting: Family Medicine

## 2021-10-27 ENCOUNTER — Encounter: Payer: Self-pay | Admitting: Family Medicine

## 2021-10-27 ENCOUNTER — Other Ambulatory Visit: Payer: Self-pay | Admitting: Family Medicine

## 2021-10-27 VITALS — BP 110/70 | HR 84 | Temp 97.3°F | Ht 66.0 in | Wt 256.5 lb

## 2021-10-27 DIAGNOSIS — E1169 Type 2 diabetes mellitus with other specified complication: Secondary | ICD-10-CM | POA: Diagnosis not present

## 2021-10-27 DIAGNOSIS — Z944 Liver transplant status: Secondary | ICD-10-CM | POA: Diagnosis not present

## 2021-10-27 DIAGNOSIS — I5032 Chronic diastolic (congestive) heart failure: Secondary | ICD-10-CM

## 2021-10-27 DIAGNOSIS — D849 Immunodeficiency, unspecified: Secondary | ICD-10-CM

## 2021-10-27 DIAGNOSIS — G4700 Insomnia, unspecified: Secondary | ICD-10-CM

## 2021-10-27 DIAGNOSIS — Z794 Long term (current) use of insulin: Secondary | ICD-10-CM

## 2021-10-27 DIAGNOSIS — C61 Malignant neoplasm of prostate: Secondary | ICD-10-CM

## 2021-10-27 DIAGNOSIS — N1831 Chronic kidney disease, stage 3a: Secondary | ICD-10-CM

## 2021-10-27 LAB — POCT GLYCOSYLATED HEMOGLOBIN (HGB A1C): Hemoglobin A1C: 8.1 % — AB (ref 4.0–5.6)

## 2021-10-27 MED ORDER — OZEMPIC (0.25 OR 0.5 MG/DOSE) 2 MG/1.5ML ~~LOC~~ SOPN
PEN_INJECTOR | SUBCUTANEOUS | 3 refills | Status: DC
Start: 2021-10-27 — End: 2021-11-18

## 2021-10-27 MED ORDER — LANTUS SOLOSTAR 100 UNIT/ML ~~LOC~~ SOPN: 55.0000 [IU] | PEN_INJECTOR | Freq: Two times a day (BID) | SUBCUTANEOUS | 3 refills | Status: DC

## 2021-10-27 MED ORDER — METFORMIN HCL ER 500 MG PO TB24: 500.0000 mg | ORAL_TABLET | Freq: Two times a day (BID) | ORAL | 1 refills | Status: DC

## 2021-10-27 NOTE — Assessment & Plan Note (Addendum)
Appreciate urology care. S/p radiation therapy/seeds, ADT. Latest PSA <0.1.  ?

## 2021-10-27 NOTE — Assessment & Plan Note (Signed)
Now on jardiance. ?Has previously seen renal  ?

## 2021-10-27 NOTE — Assessment & Plan Note (Signed)
Notes difficulty with this. States melatonin didn't previously help.  ?

## 2021-10-27 NOTE — Progress Notes (Signed)
? ? Patient ID: Colton Porter, male    DOB: 10/11/1965, 56 y.o.   MRN: [id number] ? ?This visit was conducted in person. ? ?BP 110/70   Pulse 84   Temp (!) 97.3 ?F (36.3 ?C) (Temporal)   Ht 5' 6" (1.676 m)   Wt 256 lb 8 oz (116.3 kg)   SpO2 97%   BMI 41.40 kg/m?   ? ?CC: 4 mo DM f/u visit  ?Subjective:  ? ?HPI: ?Colton Porter is a 56 y.o. male presenting on 10/27/2021 for Follow-up (4 mo DM ) ? ? ?Known type 2 diabetic, chronic HFpEF, CKD stage 3 s/p liver transplant for alcoholic cirrhosis on myfortic and prograf now recent prostate cancer diagnosis. ? ?Gleason 3+3 and Gleason 3+4 R>L prostate cancer diagnosed 02/2021 undergoing Eligard 6mo depo and radiation therapy (external beam image guided IMRT) - completed 06/2021. Undergoing treatment.  ? ?Struggling with insomnia. Melatonin hasn't helped.  ? ?DM - does not regularly check sugars - last reading last week 180s fasting, 180s postprandial. Compliant with antihyperglycemic regimen which includes: jardiance 25mg daily, lantus 55u bid, metformin XR 500mg daily - recent increase to BID. Trulicity caused diarrhea. Also cost too high. Hypoglycemic symptoms a few weeks ago - improved with coke and nab. Denies blurry vision. + paresthesias to toes. Tolerating jardiance without UTI sxs. Last diabetic eye exam 02/2020 - DUE. Glucometer brand: accuchek. Pending evaluation for Freestyle Libre 2 CGM coverage. Last foot exam: 02/2021. DSME: completed 08/2017 at [facility]. No fmhx thyroid cancer.  ?Lab Results  ?Component Value Date  ? HGBA1C 8.1 (A) 10/27/2021  ? ?Diabetic Foot Exam - Simple   ?Simple Foot Form ?Diabetic Foot exam was performed with the following findings: Yes 10/27/2021 10:07 AM  ?Visual Inspection ?No deformities, no ulcerations, no other skin breakdown bilaterally: Yes ?Sensation Testing ?Intact to touch and monofilament testing bilaterally: Yes ?Pulse Check ?Posterior Tibialis and Dorsalis pulse intact bilaterally: Yes ?Comments ?2+ DP bilaterally ?  ? ?Lab  Results  ?Component Value Date  ? MICROALBUR <0.7 08/27/2014  ?  ? ?   ? ?Relevant past medical, surgical, family and social history reviewed and updated as indicated. Interim medical history since our last visit reviewed. ?Allergies and medications reviewed and updated. ?Outpatient Medications Prior to Visit  ?Medication Sig Dispense Refill  ? Accu-Chek FastClix Lancets MISC USE THREE TIMES DAILY TO CHECK SUGARS E65.11, INSULIN USE 102 each 12  ? ACCU-CHEK GUIDE test strip USE AS INSTRUCTED TO CHECK SUGARS THREE TIMES DAILY E11.65, INSULIN USE 100 strip 12  ? acetaminophen (TYLENOL) 500 MG tablet Take 500 mg by mouth every 6 (six) hours as needed for mild pain.    ? allopurinol (ZYLOPRIM) 300 MG tablet TAKE 1 TABLET EVERY DAY 90 tablet 0  ? ASPIR-LOW 81 MG EC tablet Take 1 tablet (81 mg total) by mouth daily. 90 tablet 3  ? B-D UF III MINI PEN NEEDLES 31G X 5 MM MISC USE TO INJECT INSULIN DAILY 100 each 3  ? Blood Glucose Monitoring Suppl (ACCU-CHEK GUIDE) w/Device KIT 1 Units by Does not apply route as directed. 1 kit 0  ? Cholecalciferol (VITAMIN D3) 2000 units TABS Take 1 tablet by mouth at bedtime.    ? Continuous Blood Gluc Sensor (FREESTYLE LIBRE 2 SENSOR) MISC Apply sensor every 14 days to monitor sugar continously 2 each 5  ? docusate sodium (COLACE) 100 MG capsule Take 100 mg by mouth daily as needed for mild constipation.     ?   empagliflozin (JARDIANCE) 25 MG TABS tablet Take 1 tablet (25 mg total) by mouth daily before breakfast. 90 tablet 3  ? escitalopram (LEXAPRO) 10 MG tablet TAKE 1 TABLET EVERY DAY 90 tablet 1  ? fluticasone (FLONASE) 50 MCG/ACT nasal spray Place 2 sprays into both nostrils daily. 16 g 5  ? furosemide (LASIX) 40 MG tablet Take 1 tablet (40 mg total) by mouth daily. 90 tablet 3  ? glucose blood (ACCU-CHEK GUIDE) test strip Check sugars three times daily and as needed when feeling ill E11.65, insulin use 100 each 11  ? loratadine (CLARITIN) 10 MG tablet Take 10 mg by mouth daily.    ?  losartan (COZAAR) 50 MG tablet TAKE 1 TABLET EVERY DAY 90 tablet 2  ? lovastatin (MEVACOR) 20 MG tablet TAKE 1 TABLET (20 MG TOTAL) BY MOUTH AT BEDTIME. 90 tablet 2  ? magnesium oxide (MAG-OX) 400 (240 Mg) MG tablet TAKE 1 TABLET BY MOUTH TWICE A DAY 180 tablet 1  ? methocarbamol (ROBAXIN) 500 MG tablet Take 1 tablet (500 mg total) by mouth 3 (three) times daily as needed for muscle spasms (sedation precautions). 40 tablet 0  ? Multiple Vitamins-Minerals (CVS SPECTRAVITE PO) Take 1 tablet by mouth at bedtime.    ? mycophenolate (MYFORTIC) 360 MG TBEC EC tablet Take 360 mg by mouth 2 (two) times daily.    ? Omega-3 Fatty Acids (FISH OIL PO) Take 1 tablet by mouth at bedtime.    ? omeprazole (PRILOSEC) 40 MG capsule Take 1 capsule (40 mg total) by mouth daily. 90 capsule 3  ? oxyCODONE (ROXICODONE) 5 MG immediate release tablet 1-2 tabs PO q6 hours prn pain 20 tablet 0  ? potassium chloride (KLOR-CON M) 10 MEQ tablet Take by mouth.    ? tacrolimus (PROGRAF) 1 MG capsule Take 1-2 mg by mouth See admin instructions. Take 2 mg by mouth in the morning, then take 1 mg  by mouth in the evening    ? insulin glargine (LANTUS SOLOSTAR) 100 UNIT/ML Solostar Pen Inject 100 Units into the skin every morning. And pen needles 1/day (Patient taking differently: Inject 55 Units into the skin 2 (two) times daily. And pen needles 1/day) 105 mL 3  ? metFORMIN (GLUCOPHAGE-XR) 500 MG 24 hr tablet TAKE 1 TABLET BY MOUTH EVERY DAY WITH BREAKFAST (Patient taking differently: Take 500 mg by mouth 2 (two) times daily.) 90 tablet 3  ? ?No facility-administered medications prior to visit.  ?  ? ?Per HPI unless specifically indicated in ROS section below ?Review of Systems ? ?Objective:  ?BP 110/70   Pulse 84   Temp (!) 97.3 ?F (36.3 ?C) (Temporal)   Ht 5' 6" (1.676 m)   Wt 256 lb 8 oz (116.3 kg)   SpO2 97%   BMI 41.40 kg/m?   ?Wt Readings from Last 3 Encounters:  ?10/27/21 256 lb 8 oz (116.3 kg)  ?10/26/21 260 lb (117.9 kg)  ?08/12/21 256  lb 8 oz (116.3 kg)  ?  ?  ?Physical Exam ?Vitals and nursing note reviewed.  ?Constitutional:   ?   Appearance: Normal appearance. He is not ill-appearing.  ?Eyes:  ?   Extraocular Movements: Extraocular movements intact.  ?   Conjunctiva/sclera: Conjunctivae normal.  ?   Pupils: Pupils are equal, round, and reactive to light.  ?Cardiovascular:  ?   Rate and Rhythm: Normal rate and regular rhythm.  ?   Pulses: Normal pulses.  ?   Heart sounds: Normal heart sounds. No murmur  heard. ?Pulmonary:  ?   Effort: Pulmonary effort is normal. No respiratory distress.  ?   Breath sounds: Normal breath sounds. No wheezing, rhonchi or rales.  ?Musculoskeletal:  ?   Right lower leg: No edema.  ?   Left lower leg: No edema.  ?   Comments: See HPI for foot exam if done  ?Skin: ?   General: Skin is warm and dry.  ?   Findings: No rash.  ?Neurological:  ?   Mental Status: He is alert.  ?Psychiatric:     ?   Mood and Affect: Mood normal.     ?   Behavior: Behavior normal.  ? ?   ?Results for orders placed or performed in visit on 10/27/21  ?POCT glycosylated hemoglobin (Hb A1C)  ?Result Value Ref Range  ? Hemoglobin A1C 8.1 (A) 4.0 - 5.6 %  ? HbA1c POC (<> result, manual entry)    ? HbA1c, POC (prediabetic range)    ? HbA1c, POC (controlled diabetic range)    ? ?Lab Results  ?Component Value Date  ? CREATININE 1.31 (H) 04/02/2021  ? BUN 14 04/02/2021  ? NA 140 04/02/2021  ? K 4.1 04/02/2021  ? CL 104 04/02/2021  ? CO2 26 04/02/2021  ? ?Assessment & Plan:  ? ?Problem List Items Addressed This Visit   ? ? Type 2 diabetes mellitus with other specified complication (HCC) - Primary (Chronic)  ?  Chronic, improved based on A1c. Continue higher dose metformin XR BID which he seems to be tolerating well. Will also send in ozempic to price out, see if tolerated. Discussed mechanism of action of medication as well as possible side effects. Reassess at 50moDM f/u visit.  ? ?  ?  ? Relevant Medications  ? metFORMIN (GLUCOPHAGE-XR) 500 MG 24 hr  tablet  ? insulin glargine (LANTUS SOLOSTAR) 100 UNIT/ML Solostar Pen  ? Semaglutide,0.25 or 0.5MG/DOS, (OZEMPIC, 0.25 OR 0.5 MG/DOSE,) 2 MG/1.5ML SOPN  ? Other Relevant Orders  ? POCT glycosylated hemoglobin (Hb

## 2021-10-27 NOTE — Assessment & Plan Note (Signed)
Seems euvolemic today.  ?Continue jardiance and cards f/u.  ?

## 2021-10-27 NOTE — Assessment & Plan Note (Addendum)
Followed by liver clinic, on prograf and myfortic  ?

## 2021-10-27 NOTE — Patient Instructions (Addendum)
Check on eye exam as you're due.  ?We will sign you up for next diabetic eye exam in our office.  ?Call to schedule heart doctor appointment. ?I've sent in ozempic to price out and try out.  ?Return in 4 months for wellness visit/physical.  ?

## 2021-10-27 NOTE — Assessment & Plan Note (Addendum)
Chronic, improved based on A1c. Continue higher dose metformin XR BID which he seems to be tolerating well. Will also send in ozempic to price out, see if tolerated. Discussed mechanism of action of medication as well as possible side effects. Reassess at 45moDM f/u visit.  ?

## 2021-10-29 ENCOUNTER — Other Ambulatory Visit: Payer: Self-pay | Admitting: Family Medicine

## 2021-10-29 DIAGNOSIS — E1169 Type 2 diabetes mellitus with other specified complication: Secondary | ICD-10-CM | POA: Diagnosis not present

## 2021-11-10 LAB — HM DIABETES EYE EXAM

## 2021-11-12 ENCOUNTER — Encounter: Payer: Self-pay | Admitting: Primary Care

## 2021-11-12 ENCOUNTER — Telehealth: Payer: Self-pay

## 2021-11-12 NOTE — Telephone Encounter (Signed)
Per Dr. Darnell Level, neg for diabetic retinopathy.  Spoke with pt relaying results and advised to rpt in 1 yr.  Pt verbalizes understanding.

## 2021-11-18 ENCOUNTER — Ambulatory Visit
Admission: RE | Admit: 2021-11-18 | Discharge: 2021-11-18 | Disposition: A | Discharge status: Home / Self Care | Payer: Medicare HMO | Source: Ambulatory Visit | Attending: Radiation Oncology | Admitting: Radiation Oncology

## 2021-11-18 ENCOUNTER — Encounter: Payer: Self-pay | Admitting: Radiation Oncology

## 2021-11-18 VITALS — BP 139/89 | HR 89 | Temp 97.5°F | Resp 16 | Ht 66.5 in | Wt 256.0 lb

## 2021-11-18 DIAGNOSIS — C61 Malignant neoplasm of prostate: Secondary | ICD-10-CM | POA: Insufficient documentation

## 2021-11-18 DIAGNOSIS — Z923 Personal history of irradiation: Secondary | ICD-10-CM | POA: Diagnosis not present

## 2021-11-18 DIAGNOSIS — Z08 Encounter for follow-up examination after completed treatment for malignant neoplasm: Secondary | ICD-10-CM | POA: Diagnosis not present

## 2021-11-18 NOTE — Progress Notes (Signed)
Radiation Oncology Follow up Note  Name: Colton Porter   Date:   11/18/2021 MRN:  [id number] DOB: 1965/11/25    This 56 y.o. 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  REFERRING PROVIDER: GRia Bush MD  HPI: Patient is a 56year old male now out 4 months having completed IMRT radiation therapy for Gleason 7 adenocarcinoma the prostate seen today in routine follow-up he is doing well specifically denies any increased lower Neri tract symptoms diarrhea or fatigue which has improved..  His original PSA was 16.8 it is now less than 0.13 weeks ago.  COMPLICATIONS OF TREATMENT: none  FOLLOW UP COMPLIANCE: keeps appointments   PHYSICAL EXAM:  BP 139/89 (BP Location: Left Wrist, Patient Position: Sitting, Cuff Size: Small)   Pulse 89   Temp (!) 97.5 F (36.4 C) (Tympanic)   Resp 16   Ht 5' 6.5" (1.689 m) Comment: stated wt.  Wt 256 lb (116.1 kg)   BMI 40.70 kg/m  Well-developed well-nourished patient in NAD. HEENT reveals PERLA, EOMI, discs not visualized.  Oral cavity is clear. No oral mucosal lesions are identified. Neck is clear without evidence of cervical or supraclavicular adenopathy. Lungs are clear to A&P. Cardiac examination is essentially unremarkable with regular rate and rhythm without murmur rub or thrill. Abdomen is benign with no organomegaly or masses noted. Motor sensory and DTR levels are equal and symmetric in the upper and lower extremities. Cranial nerves II through XII are grossly intact. Proprioception is intact. No peripheral adenopathy or edema is identified. No motor or sensory levels are noted. Crude visual fields are within normal range.  RADIOLOGY RESULTS: No current films for review  PLAN: Present time patient is doing well under excellent biochemical control of his prostate cancer and pleased with his overall progress.  I have asked  to see him back in 6 months for follow-up.  Patient knows to call with any concerns.  I would like to take this opportunity to thank you for allowing me to participate in the care of your patient..Noreene Filbert MD

## 2021-11-30 ENCOUNTER — Telehealth: Payer: Self-pay

## 2021-11-30 DIAGNOSIS — Z944 Liver transplant status: Secondary | ICD-10-CM | POA: Diagnosis not present

## 2021-11-30 DIAGNOSIS — E669 Obesity, unspecified: Secondary | ICD-10-CM | POA: Diagnosis not present

## 2021-11-30 DIAGNOSIS — D849 Immunodeficiency, unspecified: Secondary | ICD-10-CM | POA: Diagnosis not present

## 2021-11-30 NOTE — Progress Notes (Signed)
  Chronic Care Management Pharmacy Assistant   Name: Colton Porter  MRN: [id number] DOB: 06/14/1966  Reason for Encounter: CCM (Appointment Reminder)  Medications: Outpatient Encounter Medications as of 11/30/2021  Medication Sig Note   Accu-Chek FastClix Lancets MISC USE THREE TIMES DAILY TO CHECK SUGARS E65.11, INSULIN USE    ACCU-CHEK GUIDE test strip USE AS INSTRUCTED TO CHECK SUGARS THREE TIMES DAILY E11.65, INSULIN USE    acetaminophen (TYLENOL) 500 MG tablet Take 500 mg by mouth every 6 (six) hours as needed for mild pain.    allopurinol (ZYLOPRIM) 300 MG tablet TAKE 1 TABLET EVERY DAY    ASPIR-LOW 81 MG EC tablet Take 1 tablet (81 mg total) by mouth daily.    B-D UF III MINI PEN NEEDLES 31G X 5 MM MISC USE TO INJECT INSULIN DAILY    Blood Glucose Monitoring Suppl (ACCU-CHEK GUIDE) w/Device KIT 1 Units by Does not apply route as directed.    Cholecalciferol (VITAMIN D3) 2000 units TABS Take 1 tablet by mouth at bedtime.    Continuous Blood Gluc Sensor (FREESTYLE LIBRE 2 SENSOR) MISC Apply sensor every 14 days to monitor sugar continously (Patient not taking: Reported on 11/18/2021) 10/21/2021: Pending authorization - [facility]   docusate sodium (COLACE) 100 MG capsule Take 100 mg by mouth daily as needed for mild constipation.     empagliflozin (JARDIANCE) 25 MG TABS tablet Take 1 tablet (25 mg total) by mouth daily before breakfast.    escitalopram (LEXAPRO) 10 MG tablet TAKE 1 TABLET EVERY DAY    fluticasone (FLONASE) 50 MCG/ACT nasal spray Place 2 sprays into both nostrils daily.    furosemide (LASIX) 40 MG tablet Take 1 tablet (40 mg total) by mouth daily.    glucose blood (ACCU-CHEK GUIDE) test strip Check sugars three times daily and as needed when feeling ill E11.65, insulin use    insulin glargine (LANTUS SOLOSTAR) 100 UNIT/ML Solostar Pen Inject 55 Units into the skin 2 (two) times daily. And pen needles 1/day    loratadine (CLARITIN) 10 MG tablet Take 10 mg by  mouth daily.    losartan (COZAAR) 50 MG tablet TAKE 1 TABLET EVERY DAY    lovastatin (MEVACOR) 20 MG tablet TAKE 1 TABLET (20 MG TOTAL) BY MOUTH AT BEDTIME.    magnesium oxide (MAG-OX) 400 (240 Mg) MG tablet TAKE 1 TABLET BY MOUTH TWICE A DAY    metFORMIN (GLUCOPHAGE-XR) 500 MG 24 hr tablet Take 1 tablet (500 mg total) by mouth 2 (two) times daily.    methocarbamol (ROBAXIN) 500 MG tablet Take 1 tablet (500 mg total) by mouth 3 (three) times daily as needed for muscle spasms (sedation precautions).    Multiple Vitamins-Minerals ([employer]S SPECTRAVITE PO) Take 1 tablet by mouth at bedtime.    mycophenolate (MYFORTIC) 360 MG TBEC EC tablet Take 360 mg by mouth 2 (two) times daily.    Omega-3 Fatty Acids (FISH OIL PO) Take 1 tablet by mouth at bedtime.    omeprazole (PRILOSEC) 40 MG capsule Take 1 capsule (40 mg total) by mouth daily.    potassium chloride (KLOR-CON M) 10 MEQ tablet Take by mouth.    tacrolimus (PROGRAF) 1 MG capsule Take 1-2 mg by mouth See admin instructions. Take 2 mg by mouth in the morning, then take 1 mg  by mouth in the evening    No facility-administered encounter medications on file as of 11/30/2021.   Colton Porter was contacted to remind of upcoming telephone visit   with Lindsey Foltanski on 12/03/2021 at 1:00. Patient was reminded to have any blood glucose and blood pressure readings available for review at appointment.   Message was left reminding patient of appointment.  CCM referral has been placed prior to visit?  Yes   Star Rating Drugs: Medication:  Last Fill: Day Supply Metformin 500 mg 10/15/2021 90 Lovastatin 20 mg 11/19/2021 90 Losartan 50 mg 10/29/2021 90  Lindsey Foltanski, CPP notified  Amy Moss, RMA Clinical Pharmacy Assistant [phone]      

## 2021-12-03 ENCOUNTER — Ambulatory Visit (INDEPENDENT_AMBULATORY_CARE_PROVIDER_SITE_OTHER): Payer: Medicare HMO | Admitting: Pharmacist

## 2021-12-03 DIAGNOSIS — E1169 Type 2 diabetes mellitus with other specified complication: Secondary | ICD-10-CM

## 2021-12-03 DIAGNOSIS — I5032 Chronic diastolic (congestive) heart failure: Secondary | ICD-10-CM

## 2021-12-03 DIAGNOSIS — I10 Essential (primary) hypertension: Secondary | ICD-10-CM

## 2021-12-03 NOTE — Progress Notes (Signed)
Chronic Care Management Pharmacy Note  12/03/2021 Name:  Colton Porter MRN:  [id number] DOB:  06-18-1966  Summary: CCM F/U visit -A1c 8.1% (10/2021), improved from 9.6%; he did not start Ozempic due to high cost; he has not started [employer] yet though he did receive the sensors, his wife wears [employer] 3 and can show him how to use it  Recommendations/Changes made from today's visit: -Pursue PAP for [place] pt to call if he has trouble with [employer] 2  Plan: -[facility] will call patient 1 week re: PAP update -Pharmacist follow up televisit scheduled for 1 month -PCP CPE 03/01/22   Subjective: Colton Porter is an 56 y.o. year old male who is a primary patient of Ria Bush, MD.  The CCM team was consulted for assistance with disease management and care coordination needs.    Engaged with patient by telephone for follow up visit in response to provider referral for pharmacy case management and/or care coordination services.   Consent to Services:  The patient was given information about Chronic Care Management services, agreed to services, and gave verbal consent prior to initiation of services.  Please see initial visit note for detailed documentation.   Patient Care Team: Ria Bush, MD as PCP - General (Family Medicine) Freada Bergeron, MD as PCP - Cardiology (Cardiology) Charlton Haws, [facility] as Pharmacist (Pharmacist)  Recent office visits: 10/27/21 Dr Darnell Level OV: f/u DM; A1c 8.1% improved. Trial Ozempic. 06/22/21 Dr [name] OV: f/u DM. A1c 8.6 > 9.3%; increased Jardiance to 25 mg. RTC 2 weeks for BMP on higher Jardiance dose 04/29/21 - PCP message - Back pain, trial robaxin PRN   Recent consult visits:  10/26/21 Dr Erlene Quan (Urology): f/u prostate cancer. S/p radiation. Rec calcium/Vitamin D post ADT. Monitor PSA q6 months. 03/22/21 - Endocrinology - A1c improved, 8.6%. Follow up with PCP.   Hospital visits:  None  in previous 6 months  Objective:  Lab Results  Component Value Date   CREATININE 1.31 (H) 04/02/2021   BUN 14 04/02/2021   GFR 68.61 02/08/2021   GFRNONAA >60 08/02/2019   GFRAA >60 08/02/2019   NA 140 04/02/2021   K 4.1 04/02/2021   CALCIUM 9.2 04/02/2021   CO2 26 04/02/2021   GLUCOSE 138 (H) 04/02/2021   Lab Results  Component Value Date/Time   HGBA1C 8.1 (A) 10/27/2021 09:41 AM   HGBA1C 9.3 (A) 06/22/2021 09:12 AM   HGBA1C 11.5 (H) 02/08/2021 08:02 AM   HGBA1C 10.6 (H) 01/22/2020 07:28 AM   FRUCTOSAMINE 436 (H) 04/22/2020 08:35 AM   FRUCTOSAMINE 232 08/02/2017 08:26 AM   GFR 68.61 02/08/2021 08:02 AM   GFR 70.48 04/07/2020 08:39 AM   MICROALBUR <0.7 08/27/2014 09:51 AM    Last diabetic Eye exam:  Lab Results  Component Value Date/Time   HMDIABEYEEXA No Retinopathy 11/10/2021 12:00 AM     Lab Results  Component Value Date   CHOL 98 02/08/2021   HDL 26.50 (L) 02/08/2021   LDLCALC 44 10/06/2017   LDLDIRECT 38.0 02/08/2021   TRIG (H) 02/08/2021    409.0 Triglyceride is over 400; calculations on Lipids are invalid.   CHOLHDL 4 02/08/2021       Latest Ref Rng & Units 04/02/2021    4:45 PM 02/08/2021    8:02 AM 04/07/2020    8:39 AM  Hepatic Function  Total Protein 6.1 - 8.1 g/dL 7.4  6.9    Albumin 3.5 - 5.2 g/dL  3.9  4.1   AST 10 - 35 U/L 16  14    ALT 9 - 46 U/L 21  21    Alk Phosphatase 39 - 117 U/L  102    Total Bilirubin 0.2 - 1.2 mg/dL 0.6  0.5      Lab Results  Component Value Date/Time   TSH 1.10 04/07/2020 08:39 AM   TSH 0.716 07/18/2019 11:33 AM   TSH 1.47 10/03/2016 08:17 AM   FREET4 0.91 11/01/2013 03:06 PM   FREET4 0.9 05/27/2009 10:50 AM       Latest Ref Rng & Units 05/26/2021    3:35 PM 04/02/2021    4:45 PM 02/08/2021    8:02 AM  CBC  WBC 4.0 - 10.5 K/uL 7.0  7.9  6.6   Hemoglobin 13.0 - 17.0 g/dL 13.1  13.8  13.0   Hematocrit 39.0 - 52.0 % 39.5  42.2  38.8   Platelets 150 - 400 K/uL 162  182  143.0     Lab Results  Component  Value Date/Time   VD25OH 43.81 02/08/2021 08:02 AM   VD25OH 38.35 01/22/2020 07:28 AM    Clinical ASCVD: No  The ASCVD Risk score (Arnett DK, et al., 2019) failed to calculate for the following reasons:   The valid total cholesterol range is 130 to 320 mg/dL       10/27/2021   10:41 AM 02/15/2021    9:33 AM 01/22/2020    3:30 PM  Depression screen PHQ 2/9  Decreased Interest 0 0 0  Down, Depressed, Hopeless 0 0 0  PHQ - 2 Score 0 0 0  Altered sleeping 3 2 0  Tired, decreased energy 2 2 0  Change in appetite 0 1 0  Feeling bad or failure about yourself  0 0 0  Trouble concentrating 0 0 0  Moving slowly or fidgety/restless 0 0 0  Suicidal thoughts 0 0 0  PHQ-9 Score 5 5 0  Difficult doing work/chores Not difficult at all  Not difficult at all    Social History   Tobacco Use  Smoking Status Never  Smokeless Tobacco Former   Types: Chew   BP Readings from Last 3 Encounters:  11/18/21 139/89  10/27/21 110/70  10/26/21 (!) 172/76   Pulse Readings from Last 3 Encounters:  11/18/21 89  10/27/21 84  10/26/21 87   Wt Readings from Last 3 Encounters:  11/18/21 256 lb (116.1 kg)  10/27/21 256 lb 8 oz (116.3 kg)  10/26/21 260 lb (117.9 kg)   BMI Readings from Last 3 Encounters:  11/18/21 40.70 kg/m  10/27/21 41.40 kg/m  10/26/21 41.97 kg/m    Assessment/Interventions: Review of patient past medical history, allergies, medications, health status, including review of consultants reports, laboratory and other test data, was performed as part of comprehensive evaluation and provision of chronic care management services.   SDOH:  (Social Determinants of Health) assessments and interventions performed: Yes SDOH Interventions    Flowsheet Row Most Recent Value  SDOH Interventions   Food Insecurity Interventions Intervention Not Indicated  Transportation Interventions Intervention Not Indicated        SDOH Screenings   Alcohol Screen: Low Risk  (01/22/2020)   Alcohol  Screen    Last Alcohol Screening Score (AUDIT): 0  Depression (PHQ2-9): Medium Risk (10/27/2021)   Depression (PHQ2-9)    PHQ-2 Score: 5  Financial Resource Strain: Low Risk  (04/13/2021)   Overall Financial Resource Strain (CARDIA)    Difficulty of Paying  Living Expenses: Not very hard  Recent Concern: Financial Resource Strain - Medium Risk (03/02/2021)   Overall Financial Resource Strain (CARDIA)    Difficulty of Paying Living Expenses: Somewhat hard  Food Insecurity: No Food Insecurity (12/03/2021)   Hunger Vital Sign    Worried About Running Out of Food in the Last Year: Never true    Ran Out of Food in the Last Year: Never true  Housing: Low Risk  (01/22/2020)   Housing    Last Housing Risk Score: 0  Physical Activity: Inactive (01/22/2020)   Exercise Vital Sign    Days of Exercise per Week: 0 days    Minutes of Exercise per Session: 0 min  Social Connections: Not on file  Stress: No Stress Concern Present (01/22/2020)   [place]    Feeling of Stress : Not at all  Tobacco Use: Medium Risk (11/18/2021)   Patient History    Smoking Tobacco Use: Never    Smokeless Tobacco Use: Former    Passive Exposure: Not on file  Transportation Needs: No Transportation Needs (12/03/2021)   PRAPARE - Hydrologist (Medical): No    Lack of Transportation (Non-Medical): No    CCM Care Plan  Allergies  Allergen Reactions   Tolmetin Rash    cirrhosis   Ambien [Zolpidem Tartrate] Other (See Comments)    Over-toxicity from liver failure   Morphine And Related     Hallucinations.   Trulicity [Dulaglutide]     frequency   Hydrochlorothiazide W-Triamterene Other (See Comments)    REACTION: dizzy, nausea   Lisinopril Other (See Comments)    REACTION: cough, decreased libido    Medications Reviewed Today     Reviewed by Charlton Haws, [facility] (Pharmacist) on 12/03/21 at 1334  Med List Status:  <None>   Medication Order Taking? Sig Documenting Provider Last Dose Status Informant  Accu-Chek FastClix Lancets MISC [id number] Yes USE THREE TIMES DAILY TO CHECK SUGARS E65.11, INSULIN USE Ria Bush, MD Taking Active   ACCU-CHEK GUIDE test strip [id number] Yes USE AS INSTRUCTED TO CHECK SUGARS THREE TIMES DAILY E11.65, INSULIN USE Ria Bush, MD Taking Active   acetaminophen (TYLENOL) 500 MG tablet [id number] Yes Take 500 mg by mouth every 6 (six) hours as needed for mild pain. [provider] Taking Active Spouse/Significant Other  allopurinol (ZYLOPRIM) 300 MG tablet [id number] Yes TAKE 1 TABLET EVERY DAY Ria Bush, MD Taking Active   ASPIR-LOW 81 MG EC tablet [id number] Yes Take 1 tablet (81 mg total) by mouth daily. Ria Bush, MD Taking Active   B-D UF III MINI PEN NEEDLES 31G X 5 MM MISC [id number] Yes USE TO [place] Ria Bush, MD Taking Active   Blood Glucose Monitoring Suppl (ACCU-CHEK GUIDE) w/Device KIT [id number] Yes 1 Units by Does not apply route as directed. Ria Bush, MD Taking Active Spouse/Significant Other  Cholecalciferol (VITAMIN D3) 2000 units TABS [id number] Yes Take 1 tablet by mouth at bedtime. [provider] Taking Active Spouse/Significant Other  Continuous Blood Gluc Sensor (FREESTYLE LIBRE 2 SENSOR) MISC [id number] Yes Apply sensor every 14 days to monitor sugar continously Ria Bush, MD Taking Active            Med Note Rolan Bucco Oct 21, 2021  2:44 PM) Pending authorization - [employer]  docusate sodium (COLACE) 100 MG capsule [id number] Yes Take 100 mg by mouth daily as needed for  mild constipation.  [provider] Taking Active Spouse/Significant Other  empagliflozin (JARDIANCE) 25 MG TABS tablet [id number] Yes Take 1 tablet (25 mg total) by mouth daily before breakfast. Ria Bush, MD Taking Active   escitalopram (LEXAPRO) 10 MG tablet [id number]  Yes TAKE 1 TABLET EVERY DAY Ria Bush, MD Taking Active   fluticasone [facility]) 50 MCG/ACT nasal spray [id number] Yes Place 2 sprays into both nostrils daily. Ria Bush, MD Taking Active   furosemide (LASIX) 40 MG tablet [id number] Yes Take 1 tablet (40 mg total) by mouth daily. Freada Bergeron, MD Taking Active   glucose blood (ACCU-CHEK GUIDE) test strip [id number] Yes Check sugars three times daily and as needed when feeling ill E11.65, insulin use Ria Bush, MD Taking Active Spouse/Significant Other  insulin glargine (LANTUS SOLOSTAR) 100 UNIT/ML Solostar Pen [id number] Yes Inject 55 Units into the skin 2 (two) times daily. And pen needles 1/day Ria Bush, MD Taking Active   loratadine (CLARITIN) 10 MG tablet [id number] Yes Take 10 mg by mouth daily. [provider] Taking Active Spouse/Significant Other  losartan (COZAAR) 50 MG tablet [id number] Yes TAKE 1 TABLET EVERY DAY Ria Bush, MD Taking Active   lovastatin (MEVACOR) 20 MG tablet [id number] Yes TAKE 1 TABLET (20 MG TOTAL) BY MOUTH AT BEDTIME. Ria Bush, MD Taking Active   magnesium oxide (MAG-OX) 400 (240 Mg) MG tablet [id number] Yes TAKE 1 TABLET BY MOUTH TWICE A Velora Heckler, MD Taking Active   metFORMIN (GLUCOPHAGE-XR) 500 MG 24 hr tablet [id number] Yes Take 1 tablet (500 mg total) by mouth 2 (two) times daily. Ria Bush, MD Taking Active   methocarbamol (ROBAXIN) 500 MG tablet [id number] Yes Take 1 tablet (500 mg total) by mouth 3 (three) times daily as needed for muscle spasms (sedation precautions). Ria Bush, MD Taking Active   Multiple Vitamins-Minerals ([employer] SPECTRAVITE PO) [id number] Yes Take 1 tablet by mouth at bedtime. [provider] Taking Active Spouse/Significant Other  mycophenolate (MYFORTIC) 360 MG TBEC EC tablet [id number] Yes Take 360 mg by mouth 2 (two) times daily. [provider] Taking Active   Omega-3 Fatty Acids (FISH OIL  PO) [id number] Yes Take 1 tablet by mouth at bedtime. [provider] Taking Active Spouse/Significant Other  omeprazole (PRILOSEC) 40 MG capsule [id number] Yes Take 1 capsule (40 mg total) by mouth daily. Ria Bush, MD Taking Active   potassium chloride (KLOR-CON M) 10 MEQ tablet [id number] Yes Take by mouth. [provider] Taking Active   Semaglutide,0.25 or 0.5MG/DOS, (OZEMPIC, 0.25 OR 0.5 MG/DOSE,) 2 MG/3ML SOPN [id number] Yes Inject 0.25 mg into the skin once a week. For 4 weeks, then increase to 0.5 mg weekly [provider]  Active   tacrolimus (PROGRAF) 1 MG capsule [id number] Yes Take 1-2 mg by mouth See admin instructions. Take 2 mg by mouth in the morning, then take 1 mg  by mouth in the evening [provider] Taking Active Spouse/Significant Other            Patient Active Problem List   Diagnosis Date Noted   Sinus congestion 06/22/2021   Diarrhea of presumed infectious origin 04/02/2021   Medicare annual wellness visit, subsequent 02/15/2021   Immunosuppressed status ([place]) 11/10/2020   Chronic right-sided thoracic back pain 04/07/2020   Memory loss 04/07/2020   Perianal pruritus 02/06/2020   Prostate cancer ([place]) 02/06/2020   Advanced directives, counseling/discussion 02/06/2020   Pain, dental 08/21/2019   Orthostatic hypotension 07/31/2019   Syncope 07/30/2019   Left rib  fracture 07/27/2019   Pneumonia due to COVID-19 virus 07/18/2019   [place] lesion, left 02/05/2018   Acute sinusitis 10/23/2017   Chronic heel pain, left 10/14/2017   Anejaculation 08/09/2017   Chronic gout 05/22/2017   Dyspnea 11/18/2016   BPH (benign prostatic hyperplasia) 10/05/2016   Status post liver transplant ([place]) 03/15/2016   Portal hypertensive gastropathy ([place]) 12/04/2015   Type 2 diabetes mellitus with other specified complication ([place]) 40/98/1191   Gynecomastia    CKD (chronic kidney disease) stage 3, GFR 30-59 ml/min ([place]) 11/04/2015    Depression, major, single episode, moderate ([place]) 07/09/2015   Headache 02/19/2015   Insomnia 12/06/2014   Hypomagnesemia    Other fatigue 11/18/2014   Internal hemorrhoid 10/01/2014   Erectile dysfunction 47/82/9562   Alcoholic cirrhosis of liver without ascites ([place]) 07/28/2014   (HFpEF) heart failure with preserved ejection fraction ([place]) 11/01/2013   Essential hypertension 06/04/2013   OSA on CPAP 07/11/2012   History of alcohol dependence ([place]) 07/11/2012   Thrombocytopenia ([place]) 12/15/2011   Morbid obesity with BMI of 40.0-44.9, adult ([place]) 11/16/2011   Routine general medical examination at a health care facility 09/22/2010   NEUROPATHY 05/27/2009   Pedal edema 09/22/2008   Dyslipidemia associated with type 2 diabetes mellitus ([place]) 02/15/2007   Allergic rhinitis 02/15/2007   GERD 02/15/2007    Immunization History  Administered Date(s) Administered   Hep A / Hep B 01/16/2015, 01/23/2015, 02/06/2015   Hepatitis B, adult 12/04/2015   Influenza Whole 03/24/2010   Influenza,inj,Quad PF,6+ Mos 04/22/2015, 04/06/2017, 04/23/2018, 04/23/2019, 04/07/2020, 06/22/2021   PFIZER(Purple Top)SARS-COV-2 Vaccination 11/07/2019, 11/28/2019, 05/07/2020, 12/04/2020   PNEUMOCOCCAL CONJUGATE-20 02/15/2021   Pneumococcal Polysaccharide-23 04/22/2015   Td 05/01/2007   Tdap 12/14/2015, 10/05/2020    Conditions to be addressed/monitored:  Hypertension, Hyperlipidemia, and Diabetes  Care Plan : [place]  Updates made by Charlton Haws, [place] since 12/03/2021 12:00 AM     Problem: Hypertension, Hyperlipidemia, and Diabetes   Priority: High     Long-Range Goal: Disease Management   Start Date: 03/02/2021  Expected End Date: 07/30/2022  This Visit's Progress: On track  Recent Progress: Not on track  Priority: High  Note:   Current Barriers:  Unable to achieve control of diabetes  Pharmacist Clinical Goal(s):  Patient will contact provider office for questions/concerns  as evidenced notation of same in electronic health record through collaboration with PharmD and provider.   Interventions: 1:1 collaboration with Ria Bush, MD regarding development and update of comprehensive plan of care as evidenced by provider attestation and co-signature Inter-disciplinary care team collaboration (see longitudinal plan of care) Comprehensive medication review performed; medication list updated in electronic medical record  Hypertension / HFpEF (BP goal <140/90) -Controlled - BP at goal; Denies hypotensive/hypertensive symptoms -Current treatment: Losartan 50 mg daily - Appropriate, Effective, Safe, Accessible Furosemide 40 mg daily - Appropriate, Effective, Safe, Accessible -Educated on BP goals and benefits of medications for prevention of heart attack, stroke and kidney damage; -Counseled to monitor BP at home periodically -Recommended to continue current medication   Hyperlipidemia: (LDL goal < 100) -Controlled - LDL 38 (01/2021); but TG elevated above goal secondary to DM. -Current treatment: Lovastatin 20 mg daily HS - Appropriate, Effective, Safe, Accessible -Medications previously tried: none  -Recommended to continue current medication  Diabetes (A1c goal <7%) -Uncontrolled, improving - A1c 8.1% (10/2021) improved from 9.6%; pt did not pick up Ozempic due to high cost; he received Freestyle Libre 2 but has not put it on  yet (his wife uses FL3 and can show him how to use it) -Current home glucose readings: 190-200, checking AM and post-prandial -Current medications: Jardiance 25 mg daily (PAN) - Appropriate, Effective, Safe, Accessible Lantus Solostar 55 units BID-Appropriate, Query Effective Metformin XR 500 mg BID -Appropriate, Effective, Safe, Accessible Ozempic 0.25 mg weekly - not started (cost) [employer] 2 -Medications previously tried: Musician (diarrhea, cost) -Pt is taking 0.94 u/kg/day of basal insulin, would consider this  over-basalization and further increasing the dose will probably not help with BG; at this point either GLP-1 agonist or prandial insulin is recommended next -Reviewed benefits of Ozempic and CGM; pt voiced he will start using Freestyle Libre and agrees to pursue PAP for Ozempic -Recommend to continue current medication  Health Maintenance -Vaccine gaps: Covid booster, Shingrix -Prostate cancer Dx 02/2021, completed radiation, now cancer free. Follows with urology. 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 Follows w [facility] ([facility] [place], Dr Zollie Scale)  Patient Goals/Self-Care Activities Patient will:  - take medications as prescribed as evidenced by patient report and record review focus on medication adherence by routine check glucose twice daily, document, and provide at future appointments engage in dietary modifications by limiting carbs, sugar     Medication Assistance:  Lantus ([employer] PAP) Jardiance (PAN foundation) - approved through 07/25/22  Member ID: [id number] Group ID: [id number] RX BIN: [id number] PCN: [id number]  Compliance/Adherence/Medication fill history: Care Gaps: Eye exam due   Star rating drugs: Metformin XR 524m - PDC 96% Losartan 557m- PDC 100% Lovastatin 2012m PDC 100% Jardiance - PDC 89%; LF 10/16/21 x 90 ds  Medication Access: Within the past 30 days, how often has patient missed a dose of medication? 0 Is a pillbox or other method used to improve adherence? Yes  Factors that may affect medication adherence? financial need Are meds synced by current pharmacy? No  Are meds delivered by current pharmacy? Yes  Does patient experience delays in picking up medications due to transportation concerns? No   [facility] Services Reviewed: Is patient disadvantaged to use [facility]?: Yes  Current Rx insurance plan: [employer] [place] Name and location of Current pharmacy[facility]  [id number] [place] [id number]: [phone]: [phone] [address]: [phone]: [phone] Pharmacy services reviewed with patient today?: No  Patient requests to transfer care to [facility]?: No  Reason patient declined to change pharmacies: Disadvantaged due to insurance/mail order   Care Plan and Follow Up Patient Decision:  Patient agrees to Care Plan and Follow-up.  Follow Up Plan: Telephone follow up appointment with care management team member scheduled for: 1 month  LindCharlene BrookearmD, BCACP Clinical Pharmacist [phone]

## 2021-12-03 NOTE — Patient Instructions (Signed)
Visit Information  Phone number for Pharmacist: [phone]   Goals Addressed   None     Care Plan : [place]  Updates made by Charlton Haws, RPH since 12/03/2021 12:00 AM     Problem: Hypertension, Hyperlipidemia, and Diabetes   Priority: High     Long-Range Goal: Disease Management   Start Date: 03/02/2021  Expected End Date: 07/30/2022  This Visit's Progress: On track  Recent Progress: Not on track  Priority: High  Note:   Current Barriers:  Unable to achieve control of diabetes  Pharmacist Clinical Goal(s):  Patient will contact provider office for questions/concerns as evidenced notation of same in electronic health record through collaboration with PharmD and provider.   Interventions: 1:1 collaboration with Ria Bush, MD regarding development and update of comprehensive plan of care as evidenced by provider attestation and co-signature Inter-disciplinary care team collaboration (see longitudinal plan of care) Comprehensive medication review performed; medication list updated in electronic medical record  Hypertension / HFpEF (BP goal <140/90) -Controlled - BP at goal; Denies hypotensive/hypertensive symptoms -Current treatment: Losartan 50 mg daily - Appropriate, Effective, Safe, Accessible Furosemide 40 mg daily - Appropriate, Effective, Safe, Accessible -Educated on BP goals and benefits of medications for prevention of heart attack, stroke and kidney damage; -Counseled to monitor BP at home periodically -Recommended to continue current medication   Hyperlipidemia: (LDL goal < 100) -Controlled - LDL 38 (01/2021); but TG elevated above goal secondary to DM. -Current treatment: Lovastatin 20 mg daily HS - Appropriate, Effective, Safe, Accessible -Medications previously tried: none  -Recommended to continue current medication  Diabetes (A1c goal <7%) -Uncontrolled, improving - A1c 8.1% (10/2021) improved from 9.6%; pt did not pick up  Ozempic due to high cost; he received Freestyle Libre 2 but has not put it on yet (his wife uses FL3 and can show him how to use it) -Current home glucose readings: 190-200, checking AM and post-prandial -Current medications: Jardiance 25 mg daily (PAN) - Appropriate, Effective, Safe, Accessible Lantus Solostar 55 units BID-Appropriate, Query Effective Metformin XR 500 mg BID -Appropriate, Effective, Safe, Accessible Ozempic 0.25 mg weekly - not started (cost) [employer] 2 -Medications previously tried: Musician (diarrhea, cost) -Pt is taking 0.94 u/kg/day of basal insulin, would consider this over-basalization and further increasing the dose will probably not help with BG; at this point either GLP-1 agonist or prandial insulin is recommended next -Reviewed benefits of Ozempic and CGM; pt voiced he will start using Freestyle Libre and agrees to pursue PAP for Ozempic -Recommend to continue current medication  Health Maintenance -Vaccine gaps: Covid booster, Shingrix -Prostate cancer Dx 02/2021, completed radiation, now cancer free. Follows with urology. 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 Follows w [facility] ([facility] [place], Dr Zollie Scale)  Patient Goals/Self-Care Activities Patient will:  - take medications as prescribed as evidenced by patient report and record review focus on medication adherence by routine check glucose twice daily, document, and provide at future appointments engage in dietary modifications by limiting carbs, sugar      Patient verbalizes understanding of instructions and care plan provided today and agrees to view in [place]. Active MyChart status and patient understanding of how to access instructions and care plan via MyChart confirmed with patient.    Telephone follow up appointment with pharmacy team member scheduled for: 1 month  Charlene Brooke, PharmD, [facility] Clinical Pharmacist [facility] at [facility] [phone]

## 2021-12-06 ENCOUNTER — Telehealth: Payer: Self-pay

## 2021-12-06 NOTE — Progress Notes (Signed)
    Chronic Care Management Pharmacy Assistant   Name: Colton Porter  MRN: [id number] DOB: [birth year]  Reason for Encounter: CCM (Ozempic PAP 2023)   Patient assistance forms through [employer] for [employer] have been complete and uploaded.   Charlene Brooke, CPP notified  [name], [place] Clinical Pharmacy Assistant [phone]

## 2021-12-13 DIAGNOSIS — Z79899 Other long term (current) drug therapy: Secondary | ICD-10-CM | POA: Diagnosis not present

## 2021-12-13 DIAGNOSIS — Z944 Liver transplant status: Secondary | ICD-10-CM | POA: Diagnosis not present

## 2021-12-13 DIAGNOSIS — Z4823 Encounter for aftercare following liver transplant: Secondary | ICD-10-CM | POA: Diagnosis not present

## 2021-12-14 ENCOUNTER — Other Ambulatory Visit: Payer: Self-pay

## 2021-12-14 MED ORDER — FUROSEMIDE 40 MG PO TABS: 40.0000 mg | ORAL_TABLET | Freq: Every day | ORAL | 0 refills | Status: DC

## 2021-12-21 ENCOUNTER — Telehealth: Payer: Self-pay

## 2021-12-24 DIAGNOSIS — E785 Hyperlipidemia, unspecified: Secondary | ICD-10-CM | POA: Diagnosis not present

## 2021-12-24 DIAGNOSIS — E1159 Type 2 diabetes mellitus with other circulatory complications: Secondary | ICD-10-CM | POA: Diagnosis not present

## 2021-12-24 DIAGNOSIS — I5030 Unspecified diastolic (congestive) heart failure: Secondary | ICD-10-CM

## 2021-12-24 DIAGNOSIS — I110 Hypertensive heart disease with heart failure: Secondary | ICD-10-CM

## 2021-12-24 DIAGNOSIS — Z794 Long term (current) use of insulin: Secondary | ICD-10-CM | POA: Diagnosis not present

## 2022-01-04 ENCOUNTER — Other Ambulatory Visit: Payer: Self-pay | Admitting: Family Medicine

## 2022-01-06 ENCOUNTER — Telehealth: Payer: Medicare HMO

## 2022-01-07 ENCOUNTER — Telehealth: Payer: Self-pay

## 2022-01-07 NOTE — Progress Notes (Signed)
Chronic Care Management Pharmacy [facility]   Name: Colton Porter  MRN: [id number] DOB: September 24, 1965  Reason for Encounter: CCM (Appointment Reminder)  Medications: Outpatient Encounter Medications as of 01/07/2022  Medication Sig Note   Accu-Chek FastClix Lancets MISC USE THREE TIMES DAILY TO CHECK SUGARS E65.11, INSULIN USE    ACCU-CHEK GUIDE test strip USE AS INSTRUCTED TO CHECK SUGARS THREE TIMES DAILY E11.65, INSULIN USE    acetaminophen (TYLENOL) 500 MG tablet Take 500 mg by mouth every 6 (six) hours as needed for mild pain.    allopurinol (ZYLOPRIM) 300 MG tablet TAKE 1 TABLET EVERY DAY    ASPIR-LOW 81 MG EC tablet Take 1 tablet (81 mg total) by mouth daily.    B-D UF III MINI PEN NEEDLES 31G X 5 MM MISC USE TO INJECT INSULIN DAILY    Blood Glucose Monitoring Suppl (ACCU-CHEK GUIDE) w/Device KIT 1 Units by Does not apply route as directed.    Cholecalciferol (VITAMIN D3) 2000 units TABS Take 1 tablet by mouth at bedtime.    Continuous Blood Gluc Sensor (FREESTYLE LIBRE 2 SENSOR) MISC Apply sensor every 14 days to monitor sugar continously 10/21/2021: Pending authorization - [employer]   docusate sodium (COLACE) 100 MG capsule Take 100 mg by mouth daily as needed for mild constipation.     empagliflozin (JARDIANCE) 25 MG TABS tablet Take 1 tablet (25 mg total) by mouth daily before breakfast.    escitalopram (LEXAPRO) 10 MG tablet TAKE 1 TABLET EVERY DAY    fluticasone (FLONASE) 50 MCG/ACT nasal spray Place 2 sprays into both nostrils daily.    furosemide (LASIX) 40 MG tablet Take 1 tablet (40 mg total) by mouth daily.    glucose blood (ACCU-CHEK GUIDE) test strip Check sugars three times daily and as needed when feeling ill E11.65, insulin use    insulin glargine (LANTUS SOLOSTAR) 100 UNIT/ML Solostar Pen Inject 55 Units into the skin 2 (two) times daily. And pen needles 1/day    loratadine (CLARITIN) 10 MG tablet Take 10 mg by mouth daily.    losartan (COZAAR) 50 MG  tablet TAKE 1 TABLET EVERY DAY    lovastatin (MEVACOR) 20 MG tablet TAKE 1 TABLET (20 MG TOTAL) BY MOUTH AT BEDTIME.    magnesium oxide (MAG-OX) 400 (240 Mg) MG tablet TAKE 1 TABLET BY MOUTH TWICE A DAY    metFORMIN (GLUCOPHAGE-XR) 500 MG 24 hr tablet Take 1 tablet (500 mg total) by mouth 2 (two) times daily.    methocarbamol (ROBAXIN) 500 MG tablet Take 1 tablet (500 mg total) by mouth 3 (three) times daily as needed for muscle spasms (sedation precautions).    Multiple Vitamins-Minerals ([facility] SPECTRAVITE PO) Take 1 tablet by mouth at bedtime.    mycophenolate (MYFORTIC) 360 MG TBEC EC tablet Take 360 mg by mouth 2 (two) times daily.    Omega-3 Fatty Acids (FISH OIL PO) Take 1 tablet by mouth at bedtime.    omeprazole (PRILOSEC) 40 MG capsule Take 1 capsule (40 mg total) by mouth daily.    potassium chloride (KLOR-CON M) 10 MEQ tablet Take by mouth.    Semaglutide,0.25 or 0.5MG/DOS, (OZEMPIC, 0.25 OR 0.5 MG/DOSE,) 2 MG/3ML SOPN Inject 0.25 mg into the skin once a week. For 4 weeks, then increase to 0.5 mg weekly (Patient not taking: Reported on 12/03/2021) 12/03/2021: High cost - will pursue PAP   tacrolimus (PROGRAF) 1 MG capsule Take 1-2 mg by mouth See admin instructions. Take 2 mg by mouth  in the morning, then take 1 mg  by mouth in the evening    No facility-administered encounter medications on file as of 01/07/2022.   Colton Porter was contacted to remind of upcoming telephone visit with Colton Porter  on 01/12/2022 at 11:45. Patient was reminded to have any blood glucose and blood pressure readings available for review at appointment.   Message was left reminding patient of appointment.  CCM referral has been placed prior to visit?  Yes   Star Rating Drugs: Medication:  Last Fill: Day Supply Losartan 50 mg 10/29/21 90 - [facility] Lovastatin 20 mg 11/19/21 90 - [facility] Metformin 500 mg 12/07/21 [facility] Ozempic 0.25 mg On [facility] as of 11/05/21 [facility] dates [facility]  Colton Porter, CPP notified  Colton Porter, Colton Porter [phone]

## 2022-01-12 ENCOUNTER — Ambulatory Visit (INDEPENDENT_AMBULATORY_CARE_PROVIDER_SITE_OTHER): Payer: Medicare HMO | Admitting: Pharmacist

## 2022-01-12 DIAGNOSIS — I10 Essential (primary) hypertension: Secondary | ICD-10-CM

## 2022-01-12 DIAGNOSIS — I5032 Chronic diastolic (congestive) heart failure: Secondary | ICD-10-CM

## 2022-01-12 DIAGNOSIS — E1169 Type 2 diabetes mellitus with other specified complication: Secondary | ICD-10-CM

## 2022-01-12 NOTE — Progress Notes (Signed)
Chronic Care Management Pharmacy Note  01/12/2022 Name:  Colton Porter MRN:  [id number] DOB:  03/03/1966  Summary: CCM F/U visit -A1c 8.1% (10/2021), improved from 9.6%; he has not started [facility] yet though he did receive the sensors, his wife wears [employer] 3 and can show him how to use it -Ozempic PAP is ready for patient to sign, he has not come to complete paperwork yet  Recommendations/Changes made from today's visit: -Pt will complete Ozempic PAP in office -Advised pt to call if he has trouble with Freestyle Libre 2  Plan: -[facility] will call patient 1 week re: PAP update -Pharmacist follow up televisit scheduled for 1 month -PCP CPE 03/01/22   Subjective: Colton Porter is an 56 y.o. year old male who is a primary patient of Ria Bush, MD.  The CCM team was consulted for assistance with disease management and care coordination needs.    Engaged with patient by telephone for follow up visit in response to provider referral for pharmacy case management and/or care coordination services.   Consent to Services:  The patient was given information about Chronic Care Management services, agreed to services, and gave verbal consent prior to initiation of services.  Please see initial visit note for detailed documentation.   Patient Care Team: Ria Bush, MD as PCP - General (Family Medicine) Freada Bergeron, MD as PCP - Cardiology (Cardiology) Charlton Haws, [facility] as Pharmacist (Pharmacist)  Recent office visits: 10/27/21 Dr Darnell Level OV: f/u DM; A1c 8.1% improved. Trial Ozempic. 06/22/21 Dr [name] OV: f/u DM. A1c 8.6 > 9.3%; increased Jardiance to 25 mg. RTC 2 weeks for BMP on higher Jardiance dose 04/29/21 - PCP message - Back pain, trial robaxin PRN   Recent consult visits:  10/26/21 Dr Erlene Quan (Urology): f/u prostate cancer. S/p radiation. Rec calcium/Vitamin D post ADT. Monitor PSA q6 months. 03/22/21 - Endocrinology - A1c  improved, 8.6%. Follow up with PCP.   Hospital visits:  None in previous 6 months  Objective:  Lab Results  Component Value Date   CREATININE 1.31 (H) 04/02/2021   BUN 14 04/02/2021   GFR 68.61 02/08/2021   GFRNONAA >60 08/02/2019   GFRAA >60 08/02/2019   NA 140 04/02/2021   K 4.1 04/02/2021   CALCIUM 9.2 04/02/2021   CO2 26 04/02/2021   GLUCOSE 138 (H) 04/02/2021   Lab Results  Component Value Date/Time   HGBA1C 8.1 (A) 10/27/2021 09:41 AM   HGBA1C 9.3 (A) 06/22/2021 09:12 AM   HGBA1C 11.5 (H) 02/08/2021 08:02 AM   HGBA1C 10.6 (H) 01/22/2020 07:28 AM   FRUCTOSAMINE 436 (H) 04/22/2020 08:35 AM   FRUCTOSAMINE 232 08/02/2017 08:26 AM   GFR 68.61 02/08/2021 08:02 AM   GFR 70.48 04/07/2020 08:39 AM   MICROALBUR <0.7 08/27/2014 09:51 AM    Last diabetic Eye exam:  Lab Results  Component Value Date/Time   HMDIABEYEEXA No Retinopathy 11/10/2021 12:00 AM     Lab Results  Component Value Date   CHOL 98 02/08/2021   HDL 26.50 (L) 02/08/2021   LDLCALC 44 10/06/2017   LDLDIRECT 38.0 02/08/2021   TRIG (H) 02/08/2021    409.0 Triglyceride is over 400; calculations on Lipids are invalid.   CHOLHDL 4 02/08/2021       Latest Ref Rng & Units 04/02/2021    4:45 PM 02/08/2021    8:02 AM 04/07/2020    8:39 AM  Hepatic Function  Total Protein 6.1 - 8.1 g/dL 7.4  6.9    Albumin 3.5 - 5.2 g/dL  3.9  4.1   AST 10 - 35 U/L 16  14    ALT 9 - 46 U/L 21  21    Alk Phosphatase 39 - 117 U/L  102    Total Bilirubin 0.2 - 1.2 mg/dL 0.6  0.5      Lab Results  Component Value Date/Time   TSH 1.10 04/07/2020 08:39 AM   TSH 0.716 07/18/2019 11:33 AM   TSH 1.47 10/03/2016 08:17 AM   FREET4 0.91 11/01/2013 03:06 PM   FREET4 0.9 05/27/2009 10:50 AM       Latest Ref Rng & Units 05/26/2021    3:35 PM 04/02/2021    4:45 PM 02/08/2021    8:02 AM  CBC  WBC 4.0 - 10.5 K/uL 7.0  7.9  6.6   Hemoglobin 13.0 - 17.0 g/dL 13.1  13.8  13.0   Hematocrit 39.0 - 52.0 % 39.5  42.2  38.8    Platelets 150 - 400 K/uL 162  182  143.0     Lab Results  Component Value Date/Time   VD25OH 43.81 02/08/2021 08:02 AM   VD25OH 38.35 01/22/2020 07:28 AM    Clinical ASCVD: No  The ASCVD Risk score (Arnett DK, et al., 2019) failed to calculate for the following reasons:   The valid total cholesterol range is 130 to 320 mg/dL       10/27/2021   10:41 AM 02/15/2021    9:33 AM 01/22/2020    3:30 PM  Depression screen PHQ 2/9  Decreased Interest 0 0 0  Down, Depressed, Hopeless 0 0 0  PHQ - 2 Score 0 0 0  Altered sleeping 3 2 0  Tired, decreased energy 2 2 0  Change in appetite 0 1 0  Feeling bad or failure about yourself  0 0 0  Trouble concentrating 0 0 0  Moving slowly or fidgety/restless 0 0 0  Suicidal thoughts 0 0 0  PHQ-9 Score 5 5 0  Difficult doing work/chores Not difficult at all  Not difficult at all    Social History   Tobacco Use  Smoking Status Never  Smokeless Tobacco Former   Types: Chew   BP Readings from Last 3 Encounters:  11/18/21 139/89  10/27/21 110/70  10/26/21 (!) 172/76   Pulse Readings from Last 3 Encounters:  11/18/21 89  10/27/21 84  10/26/21 87   Wt Readings from Last 3 Encounters:  11/18/21 256 lb (116.1 kg)  10/27/21 256 lb 8 oz (116.3 kg)  10/26/21 260 lb (117.9 kg)   BMI Readings from Last 3 Encounters:  11/18/21 40.70 kg/m  10/27/21 41.40 kg/m  10/26/21 41.97 kg/m    Assessment/Interventions: Review of patient past medical history, allergies, medications, health status, including review of consultants reports, laboratory and other test data, was performed as part of comprehensive evaluation and provision of chronic care management services.   SDOH:  (Social Determinants of Health) assessments and interventions performed: Yes     SDOH Screenings   Alcohol Screen: Low Risk  (01/22/2020)   Alcohol Screen    Last Alcohol Screening Score (AUDIT): 0  Depression (PHQ2-9): Medium Risk (10/27/2021)   Depression (PHQ2-9)     PHQ-2 Score: 5  Financial Resource Strain: Low Risk  (04/13/2021)   Overall Financial Resource Strain (CARDIA)    Difficulty of Paying Living Expenses: Not very hard  Recent Concern: Financial Resource Strain - Medium Risk (03/02/2021)   Overall Financial Resource  Strain (CARDIA)    Difficulty of Paying Living Expenses: Somewhat hard  Food Insecurity: No Food Insecurity (12/03/2021)   Hunger Vital Sign    Worried About Running Out of Food in the Last Year: Never true    [place] in the Last Year: Never true  Housing: Low Risk  (01/22/2020)   Housing    Last Housing Risk Score: 0  Physical Activity: Inactive (01/22/2020)   Exercise Vital Sign    Days of Exercise per Week: 0 days    Minutes of Exercise per Session: 0 min  Social Connections: Not on file  Stress: No Stress Concern Present (01/22/2020)   [place]    Feeling of Stress : Not at all  Tobacco Use: Medium Risk (11/18/2021)   Patient History    Smoking Tobacco Use: Never    Smokeless Tobacco Use: Former    Passive Exposure: Not on file  Transportation Needs: No Transportation Needs (12/03/2021)   PRAPARE - Hydrologist (Medical): No    Lack of Transportation (Non-Medical): No    CCM Care Plan  Allergies  Allergen Reactions   Tolmetin Rash    cirrhosis   Ambien [Zolpidem Tartrate] Other (See Comments)    Over-toxicity from liver failure   Morphine And Related     Hallucinations.   Trulicity [Dulaglutide]     frequency   Hydrochlorothiazide W-Triamterene Other (See Comments)    REACTION: dizzy, nausea   Lisinopril Other (See Comments)    REACTION: cough, decreased libido    Medications Reviewed Today     Reviewed by Charlton Haws, [facility] (Pharmacist) on 01/12/22 at 1156  Med List Status: <None>   Medication Order Taking? Sig Documenting Provider Last Dose Status Informant  Accu-Chek FastClix Lancets MISC  [id number] Yes USE THREE TIMES DAILY TO CHECK SUGARS E65.11, INSULIN USE Ria Bush, MD Taking Active   ACCU-CHEK GUIDE test strip [id number] Yes USE AS INSTRUCTED TO CHECK SUGARS THREE TIMES DAILY E11.65, INSULIN USE Ria Bush, MD Taking Active   acetaminophen (TYLENOL) 500 MG tablet [id number] Yes Take 500 mg by mouth every 6 (six) hours as needed for mild pain. [provider] Taking Active Spouse/Significant Other  allopurinol (ZYLOPRIM) 300 MG tablet [id number] Yes TAKE 1 TABLET EVERY DAY Ria Bush, MD Taking Active   ASPIR-LOW 81 MG EC tablet [id number] Yes Take 1 tablet (81 mg total) by mouth daily. Ria Bush, MD Taking Active   B-D UF III MINI PEN NEEDLES 31G X 5 MM MISC [id number] Yes USE TO [place] Ria Bush, MD Taking Active   Blood Glucose Monitoring Suppl (ACCU-CHEK GUIDE) w/Device KIT [id number] Yes 1 Units by Does not apply route as directed. Ria Bush, MD Taking Active Spouse/Significant Other  Cholecalciferol (VITAMIN D3) 2000 units TABS [id number] Yes Take 1 tablet by mouth at bedtime. [provider] Taking Active Spouse/Significant Other  Continuous Blood Gluc Sensor (FREESTYLE LIBRE 2 SENSOR) MISC [id number] No Apply sensor every 14 days to monitor sugar continously  Patient not taking: Reported on 01/12/2022   Ria Bush, MD Not Taking Active            Med Note Malena Catholic Jan 12, 2022 11:56 AM) Pt received device - not started yet  docusate sodium (COLACE) 100 MG capsule [id number] Yes Take 100 mg by mouth daily as needed for mild constipation.  [provider] Taking Active Spouse/Significant Other  empagliflozin (JARDIANCE) 25 MG TABS tablet [id number] Yes Take 1 tablet (25 mg total) by mouth daily before breakfast. Ria Bush, MD Taking Active   escitalopram (LEXAPRO) 10 MG tablet [id number] Yes TAKE 1 TABLET EVERY DAY Ria Bush, MD Taking Active   fluticasone  [facility]) 50 MCG/ACT nasal spray [id number] Yes Place 2 sprays into both nostrils daily. Ria Bush, MD Taking Active   furosemide (LASIX) 40 MG tablet [id number] Yes Take 1 tablet (40 mg total) by mouth daily. Freada Bergeron, MD Taking Active   glucose blood (ACCU-CHEK GUIDE) test strip [id number] Yes Check sugars three times daily and as needed when feeling ill E11.65, insulin use Ria Bush, MD Taking Active Spouse/Significant Other  insulin glargine (LANTUS SOLOSTAR) 100 UNIT/ML Solostar Pen [id number] Yes Inject 55 Units into the skin 2 (two) times daily. And pen needles 1/day Ria Bush, MD Taking Active   loratadine (CLARITIN) 10 MG tablet [id number] Yes Take 10 mg by mouth daily. [provider] Taking Active Spouse/Significant Other  losartan (COZAAR) 50 MG tablet [id number] Yes TAKE 1 TABLET EVERY DAY Ria Bush, MD Taking Active   lovastatin (MEVACOR) 20 MG tablet [id number] Yes TAKE 1 TABLET (20 MG TOTAL) BY MOUTH AT BEDTIME. Ria Bush, MD Taking Active   magnesium oxide (MAG-OX) 400 (240 Mg) MG tablet [id number] Yes TAKE 1 TABLET BY MOUTH TWICE A Velora Heckler, MD Taking Active   metFORMIN (GLUCOPHAGE-XR) 500 MG 24 hr tablet [id number] Yes Take 1 tablet (500 mg total) by mouth 2 (two) times daily. Ria Bush, MD Taking Active   methocarbamol (ROBAXIN) 500 MG tablet [id number] Yes Take 1 tablet (500 mg total) by mouth 3 (three) times daily as needed for muscle spasms (sedation precautions). Ria Bush, MD Taking Active   Multiple Vitamins-Minerals ([facility] SPECTRAVITE PO) [id number] Yes Take 1 tablet by mouth at bedtime. [provider] Taking Active Spouse/Significant Other  mycophenolate (MYFORTIC) 360 MG TBEC EC tablet [id number] Yes Take 360 mg by mouth 2 (two) times daily. [provider] Taking Active   Omega-3 Fatty Acids (FISH OIL PO) [id number] Yes Take 1 tablet by mouth at bedtime. [provider]  Taking Active Spouse/Significant Other  omeprazole (PRILOSEC) 40 MG capsule [id number] Yes Take 1 capsule (40 mg total) by mouth daily. Ria Bush, MD Taking Active   potassium chloride (KLOR-CON M) 10 MEQ tablet [id number] Yes Take by mouth. [provider] Taking Active   Semaglutide,0.25 or 0.5MG/DOS, (OZEMPIC, 0.25 OR 0.5 MG/DOSE,) 2 MG/3ML SOPN [id number] No Inject 0.25 mg into the skin once a week. For 4 weeks, then increase to 0.5 mg weekly  Patient not taking: Reported on 01/12/2022   [provider] Not Taking Active            Med Note Charlton Haws   Fri Dec 03, 2021  1:34 PM) High cost - will pursue PAP  tacrolimus (PROGRAF) 1 MG capsule [id number] Yes Take 1-2 mg by mouth See admin instructions. Take 2 mg by mouth in the morning, then take 1 mg  by mouth in the evening [provider] Taking Active Spouse/Significant Other            Patient Active Problem List   Diagnosis Date Noted   Sinus congestion 06/22/2021   Diarrhea of presumed infectious origin 04/02/2021   Medicare annual wellness visit, subsequent 02/15/2021   Immunosuppressed status ([place]) 11/10/2020   Chronic right-sided thoracic back pain 04/07/2020   Memory loss 04/07/2020   Perianal pruritus 02/06/2020  Prostate cancer ([place]) 02/06/2020   Advanced directives, counseling/discussion 02/06/2020   Pain, dental 08/21/2019   Orthostatic hypotension 07/31/2019   Syncope 07/30/2019   Left rib fracture 07/27/2019   Pneumonia due to COVID-19 virus 07/18/2019   [place] lesion, left 02/05/2018   Acute sinusitis 10/23/2017   Chronic heel pain, left 10/14/2017   Anejaculation 08/09/2017   Chronic gout 05/22/2017   Dyspnea 11/18/2016   BPH (benign prostatic hyperplasia) 10/05/2016   Status post liver transplant ([place]) 03/15/2016   Portal hypertensive gastropathy ([place]) 12/04/2015   Type 2 diabetes mellitus with other specified complication ([place]) 87/86/7672   Gynecomastia    CKD  (chronic kidney disease) stage 3, GFR 30-59 ml/min ([place]) 11/04/2015   Depression, major, single episode, moderate ([place]) 07/09/2015   Headache 02/19/2015   Insomnia 12/06/2014   Hypomagnesemia    Other fatigue 11/18/2014   Internal hemorrhoid 10/01/2014   Erectile dysfunction 09/47/0962   Alcoholic cirrhosis of liver without ascites ([place]) 07/28/2014   (HFpEF) heart failure with preserved ejection fraction ([place]) 11/01/2013   Essential hypertension 06/04/2013   OSA on CPAP 07/11/2012   History of alcohol dependence ([place]) 07/11/2012   Thrombocytopenia ([place]) 12/15/2011   Morbid obesity with BMI of 40.0-44.9, adult ([place]) 11/16/2011   Routine general medical examination at a health care facility 09/22/2010   NEUROPATHY 05/27/2009   Pedal edema 09/22/2008   Dyslipidemia associated with type 2 diabetes mellitus ([place]) 02/15/2007   Allergic rhinitis 02/15/2007   GERD 02/15/2007    Immunization History  Administered Date(s) Administered   Hep A / Hep B 01/16/2015, 01/23/2015, 02/06/2015   Hepatitis B, adult 12/04/2015   Influenza Whole 03/24/2010   Influenza,inj,Quad PF,6+ Mos 04/22/2015, 04/06/2017, 04/23/2018, 04/23/2019, 04/07/2020, 06/22/2021   PFIZER(Purple Top)SARS-COV-2 Vaccination 11/07/2019, 11/28/2019, 05/07/2020, 12/04/2020   PNEUMOCOCCAL CONJUGATE-20 02/15/2021   Pneumococcal Polysaccharide-23 04/22/2015   Td 05/01/2007   Tdap 12/14/2015, 10/05/2020    Conditions to be addressed/monitored:  Hypertension, Hyperlipidemia, and Diabetes  Care Plan : [place]  Updates made by Charlton Haws, [place] since 01/12/2022 12:00 AM     Problem: Hypertension, Hyperlipidemia, and Diabetes   Priority: High     Long-Range Goal: Disease Management   Start Date: 03/02/2021  Expected End Date: 07/30/2022  This Visit's Progress: Not on track  Recent Progress: On track  Priority: High  Note:   Current Barriers:  Unable to achieve control of diabetes  Pharmacist Clinical  Goal(s):  Patient will contact provider office for questions/concerns as evidenced notation of same in electronic health record through collaboration with PharmD and provider.   Interventions: 1:1 collaboration with Ria Bush, MD regarding development and update of comprehensive plan of care as evidenced by provider attestation and co-signature Inter-disciplinary care team collaboration (see longitudinal plan of care) Comprehensive medication review performed; medication list updated in electronic medical record  Hypertension / HFpEF (BP goal <140/90) -Controlled - BP at goal; Denies hypotensive/hypertensive symptoms -Last ejection fraction: 65-70% (Date: 07/2019) -HF type: Diastolic;  NYHA Class: II (slight limitation of activity) -Current treatment: Losartan 50 mg daily - Appropriate, Effective, Safe, Accessible Furosemide 40 mg daily - Appropriate, Effective, Safe, Accessible -Educated on BP goals and benefits of medications for prevention of heart attack, stroke and kidney damage; -Counseled to monitor BP at home periodically -Recommended to continue current medication   Hyperlipidemia: (LDL goal < 100) -Controlled - LDL 38 (01/2021); but TG elevated above goal secondary to DM. -Current treatment: Lovastatin 20 mg daily HS - Appropriate, Effective, Safe, Accessible -  Medications previously tried: none  -Recommended to continue current medication  Diabetes (A1c goal <7%) -Uncontrolled, improving - A1c 8.1% (10/2021) improved from 9.6%;  he received Freestyle Libre 2 but has not put it on yet (his wife uses FL3 and can show him how to use it); pt is aware Ozempic PAP paperwork is ready for him to complete but he has not completed it yet -Current home glucose readings: 190-214, checking AM and post-prandial -Current medications: Jardiance 25 mg daily (PAP) - Appropriate, Effective, Safe, Accessible Lantus Solostar 55 units BID-Appropriate, Query Effective Metformin XR 500 mg BID  -Appropriate, Effective, Safe, Accessible Ozempic 0.25 mg weekly - not started (cost) [employer] 2 -Medications previously tried: Musician (diarrhea, cost) -Pt is taking 0.94 u/kg/day of basal insulin, would consider this over-basalization and further increasing the dose will probably not help with BG; at this point either GLP-1 agonist or prandial insulin is recommended next -Reviewed benefits of Ozempic and CGM; pt voiced he will start using [place] and agrees to come to office to sign Ozempic PAP -Recommend to continue current medication  Health Maintenance -Vaccine gaps: Covid booster, Shingrix -Prostate cancer Dx 02/2021, completed radiation, now cancer free. Follows with urology. 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 Follows w [facility] (NP [place], Dr Zollie Scale)  Patient Goals/Self-Care Activities Patient will:  - take medications as prescribed as evidenced by patient report and record review focus on medication adherence by routine check glucose twice daily, document, and provide at future appointments engage in dietary modifications by limiting carbs, sugar      Medication Assistance:  Lantus ([employer] PAP) Jardiance (PAN foundation) - approved through 07/25/22  Member ID: [id number] Group ID: [id number] RX BIN: [id number] PCN: [id number]  Compliance/Adherence/Medication fill history: Care Gaps: Eye exam due   Star rating drugs: Metformin XR 530m - PDC 96% Losartan 564m- PDC 100% Lovastatin 2074m PDC 100% Jardiance - PDC 89%; LF 10/16/21 x 90 ds  Medication Access: Within the past 30 days, how often has patient missed a dose of medication? 0 Is a pillbox or other method used to improve adherence? Yes  Factors that may affect medication adherence? financial need Are meds synced by current pharmacy? No  Are meds delivered by current pharmacy? Yes  Does patient experience delays in picking up medications due to  transportation concerns? No   [facility] Services Reviewed: Is patient disadvantaged to use [facility]?: Yes  Current Rx insurance plan: [employer] [place] Name and location of [facility]  [id number] [place] [address]: [phone]: [phone]: [phone]: [phone] Pharmacy services reviewed with patient today?: No  Patient requests to transfer care to [facility]?: No  Reason patient declined to change pharmacies: Disadvantaged due to insurance/mail order   Care Plan and Follow Up Patient Decision:  Patient agrees to Care Plan and Follow-up.  Follow Up Plan: Telephone follow up appointment with care management team member scheduled for: 1 month  LindCharlene BrookearmD, BCACP Clinical Pharmacist [place] [phone]

## 2022-01-12 NOTE — Patient Instructions (Signed)
Visit Information  Phone number for Pharmacist: [phone]   Goals Addressed   None     Care Plan : [place]  Updates made by Charlton Haws, RPH since 01/12/2022 12:00 AM     Problem: Hypertension, Hyperlipidemia, and Diabetes   Priority: High     Long-Range Goal: Disease Management   Start Date: 03/02/2021  Expected End Date: 07/30/2022  This Visit's Progress: Not on track  Recent Progress: On track  Priority: High  Note:   Current Barriers:  Unable to achieve control of diabetes  Pharmacist Clinical Goal(s):  Patient will contact provider office for questions/concerns as evidenced notation of same in electronic health record through collaboration with PharmD and provider.   Interventions: 1:1 collaboration with Ria Bush, MD regarding development and update of comprehensive plan of care as evidenced by provider attestation and co-signature Inter-disciplinary care team collaboration (see longitudinal plan of care) Comprehensive medication review performed; medication list updated in electronic medical record  Hypertension / HFpEF (BP goal <140/90) -Controlled - BP at goal; Denies hypotensive/hypertensive symptoms -Last ejection fraction: 65-70% (Date: 07/2019) -HF type: Diastolic;  NYHA Class: II (slight limitation of activity) -Current treatment: Losartan 50 mg daily - Appropriate, Effective, Safe, Accessible Furosemide 40 mg daily - Appropriate, Effective, Safe, Accessible -Educated on BP goals and benefits of medications for prevention of heart attack, stroke and kidney damage; -Counseled to monitor BP at home periodically -Recommended to continue current medication   Hyperlipidemia: (LDL goal < 100) -Controlled - LDL 38 (01/2021); but TG elevated above goal secondary to DM. -Current treatment: Lovastatin 20 mg daily HS - Appropriate, Effective, Safe, Accessible -Medications previously tried: none  -Recommended to continue current  medication  Diabetes (A1c goal <7%) -Uncontrolled, improving - A1c 8.1% (10/2021) improved from 9.6%;  he received Freestyle Libre 2 but has not put it on yet (his wife uses FL3 and can show him how to use it); pt is aware Ozempic PAP paperwork is ready for him to complete but he has not completed it yet -Current home glucose readings: 190-214, checking AM and post-prandial -Current medications: Jardiance 25 mg daily (PAP) - Appropriate, Effective, Safe, Accessible Lantus Solostar 55 units BID-Appropriate, Query Effective Metformin XR 500 mg BID -Appropriate, Effective, Safe, Accessible Ozempic 0.25 mg weekly - not started (cost) [employer] 2 -Medications previously tried: Musician (diarrhea, cost) -Pt is taking 0.94 u/kg/day of basal insulin, would consider this over-basalization and further increasing the dose will probably not help with BG; at this point either GLP-1 agonist or prandial insulin is recommended next -Reviewed benefits of Ozempic and CGM; pt voiced he will start using Freestyle Libre and agrees to come to office to sign Ozempic PAP -Recommend to continue current medication  Health Maintenance -Vaccine gaps: Covid booster, Shingrix -Prostate cancer Dx 02/2021, completed radiation, now cancer free. Follows with urology. 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 Follows w [facility] (NP [place], Dr Zollie Scale)  Patient Goals/Self-Care Activities Patient will:  - take medications as prescribed as evidenced by patient report and record review focus on medication adherence by routine check glucose twice daily, document, and provide at future appointments engage in dietary modifications by limiting carbs, sugar      Patient verbalizes understanding of instructions and care plan provided today and agrees to view in [place]. Active MyChart status and patient understanding of how to access instructions and care plan via MyChart confirmed with  patient.    Telephone follow up appointment with  pharmacy team member scheduled for: 1 month  Charlene Brooke, PharmD, [facility] Clinical Pharmacist [facility] [phone]

## 2022-01-18 NOTE — Progress Notes (Signed)
Called patient to remind him to stop by the office to sign PAP forms for Ozempic and to bring proof of income with him. No answer; left message.  Charlene Brooke, CPP notified  [name], [place] Clinical Pharmacy Assistant [phone]

## 2022-01-24 DIAGNOSIS — I5032 Chronic diastolic (congestive) heart failure: Secondary | ICD-10-CM

## 2022-01-24 DIAGNOSIS — E785 Hyperlipidemia, unspecified: Secondary | ICD-10-CM

## 2022-01-24 DIAGNOSIS — E1169 Type 2 diabetes mellitus with other specified complication: Secondary | ICD-10-CM | POA: Diagnosis not present

## 2022-01-24 DIAGNOSIS — I10 Essential (primary) hypertension: Secondary | ICD-10-CM | POA: Diagnosis not present

## 2022-01-24 DIAGNOSIS — Z794 Long term (current) use of insulin: Secondary | ICD-10-CM | POA: Diagnosis not present

## 2022-02-04 ENCOUNTER — Telehealth: Payer: Self-pay | Admitting: Family Medicine

## 2022-02-04 NOTE — Telephone Encounter (Signed)
Left message for patient to call back and schedule Medicare Annual Wellness Visit (AWV).   Please offer to do virtually or by telephone.   Last AWV: 02/15/2021  Please schedule at anytime with [facility] schedule   45 minute appointent  If any questions, please contact me at [phone]

## 2022-02-08 ENCOUNTER — Telehealth: Payer: Self-pay

## 2022-02-08 NOTE — Progress Notes (Signed)
Chronic Care Management Pharmacy Assistant   Name: Colton Porter  MRN: [id number] DOB: 1966/01/25  Reason for Encounter: CCM (Appointment Reminder)  Medications: Outpatient Encounter Medications as of 02/08/2022  Medication Sig Note   Accu-Chek FastClix Lancets MISC USE THREE TIMES DAILY TO CHECK SUGARS E65.11, INSULIN USE    ACCU-CHEK GUIDE test strip USE AS INSTRUCTED TO CHECK SUGARS THREE TIMES DAILY E11.65, INSULIN USE    acetaminophen (TYLENOL) 500 MG tablet Take 500 mg by mouth every 6 (six) hours as needed for mild pain.    allopurinol (ZYLOPRIM) 300 MG tablet TAKE 1 TABLET EVERY DAY    ASPIR-LOW 81 MG EC tablet Take 1 tablet (81 mg total) by mouth daily.    B-D UF III MINI PEN NEEDLES 31G X 5 MM MISC USE TO INJECT INSULIN DAILY    Blood Glucose Monitoring Suppl (ACCU-CHEK GUIDE) w/Device KIT 1 Units by Does not apply route as directed.    Cholecalciferol (VITAMIN D3) 2000 units TABS Take 1 tablet by mouth at bedtime.    Continuous Blood Gluc Sensor (FREESTYLE LIBRE 2 SENSOR) MISC Apply sensor every 14 days to monitor sugar continously (Patient not taking: Reported on 01/12/2022) 01/12/2022: Pt received device - not started yet   docusate sodium (COLACE) 100 MG capsule Take 100 mg by mouth daily as needed for mild constipation.     empagliflozin (JARDIANCE) 25 MG TABS tablet Take 1 tablet (25 mg total) by mouth daily before breakfast.    escitalopram (LEXAPRO) 10 MG tablet TAKE 1 TABLET EVERY DAY    fluticasone (FLONASE) 50 MCG/ACT nasal spray Place 2 sprays into both nostrils daily.    furosemide (LASIX) 40 MG tablet Take 1 tablet (40 mg total) by mouth daily.    glucose blood (ACCU-CHEK GUIDE) test strip Check sugars three times daily and as needed when feeling ill E11.65, insulin use    insulin glargine (LANTUS SOLOSTAR) 100 UNIT/ML Solostar Pen Inject 55 Units into the skin 2 (two) times daily. And pen needles 1/day    loratadine (CLARITIN) 10 MG tablet Take 10 mg by mouth  daily.    losartan (COZAAR) 50 MG tablet TAKE 1 TABLET EVERY DAY    lovastatin (MEVACOR) 20 MG tablet TAKE 1 TABLET (20 MG TOTAL) BY MOUTH AT BEDTIME.    magnesium oxide (MAG-OX) 400 (240 Mg) MG tablet TAKE 1 TABLET BY MOUTH TWICE A DAY    metFORMIN (GLUCOPHAGE-XR) 500 MG 24 hr tablet Take 1 tablet (500 mg total) by mouth 2 (two) times daily.    methocarbamol (ROBAXIN) 500 MG tablet Take 1 tablet (500 mg total) by mouth 3 (three) times daily as needed for muscle spasms (sedation precautions).    Multiple Vitamins-Minerals ([employer] SPECTRAVITE PO) Take 1 tablet by mouth at bedtime.    mycophenolate (MYFORTIC) 360 MG TBEC EC tablet Take 360 mg by mouth 2 (two) times daily.    Omega-3 Fatty Acids (FISH OIL PO) Take 1 tablet by mouth at bedtime.    omeprazole (PRILOSEC) 40 MG capsule Take 1 capsule (40 mg total) by mouth daily.    potassium chloride (KLOR-CON M) 10 MEQ tablet Take by mouth.    Semaglutide,0.25 or 0.5MG/DOS, (OZEMPIC, 0.25 OR 0.5 MG/DOSE,) 2 MG/3ML SOPN Inject 0.25 mg into the skin once a week. For 4 weeks, then increase to 0.5 mg weekly (Patient not taking: Reported on 01/12/2022) 12/03/2021: High cost - will pursue PAP   tacrolimus (PROGRAF) 1 MG capsule Take 1-2 mg by mouth See  admin instructions. Take 2 mg by mouth in the morning, then take 1 mg  by mouth in the evening    No facility-administered encounter medications on file as of 02/08/2022.   [name] was contacted to remind of upcoming telephone visit with Charlene Brooke on 02/11/22 at 1:45. Patient was reminded to have any blood glucose and blood pressure readings available for review at appointment.   Message was left reminding patient of appointment.  CCM referral has been placed prior to visit?  Yes   Star Rating Drugs: Medication:  Last Fill: Day Supply Metformin 500 mg 12/07/21 90 Losartan 50 mg 01/05/22 90  Lovastatin 20 mg 01/26/22 90 Jardiance 25 mg 01/17/22 [place], CPP notified  [name], [place] Pharmacy Assistant [phone]

## 2022-02-11 ENCOUNTER — Ambulatory Visit (INDEPENDENT_AMBULATORY_CARE_PROVIDER_SITE_OTHER): Payer: Medicare HMO | Admitting: Pharmacist

## 2022-02-11 DIAGNOSIS — E1169 Type 2 diabetes mellitus with other specified complication: Secondary | ICD-10-CM

## 2022-02-11 DIAGNOSIS — I10 Essential (primary) hypertension: Secondary | ICD-10-CM

## 2022-02-11 DIAGNOSIS — I5032 Chronic diastolic (congestive) heart failure: Secondary | ICD-10-CM

## 2022-02-11 NOTE — Progress Notes (Signed)
Chronic Care Management Pharmacy Note  02/11/2022 Name:  Colton Porter MRN:  [id number] DOB:  December 14, 1965  Summary: CCM F/U visit -A1c 8.1% (10/2021), improved from 9.6%; he has not started [facility] yet though he did receive the sensors, his wife wears [employer] 3 and can show him how to use it -Pt decided he does not want to start Ozempic right now, he wants to work on diet; he has upcoming CPE   Recommendations/Changes made from today's visit: -Advised pt to call if he has trouble with [employer] 2  Plan: -Pharmacist follow up televisit scheduled for 2 months -PCP CPE 03/01/22   Subjective: Colton Porter is an 56 y.o. year old male who is a primary patient of Ria Bush, MD.  The CCM team was consulted for assistance with disease management and care coordination needs.    Engaged with patient by telephone for follow up visit in response to provider referral for pharmacy case management and/or care coordination services.   Consent to Services:  The patient was given information about Chronic Care Management services, agreed to services, and gave verbal consent prior to initiation of services.  Please see initial visit note for detailed documentation.   Patient Care Team: Ria Bush, MD as PCP - General (Family Medicine) Freada Bergeron, MD as PCP - Cardiology (Cardiology) Charlton Haws, [facility] as Pharmacist (Pharmacist)  Recent office visits: 10/27/21 Dr Darnell Level OV: f/u DM; A1c 8.1% improved. Trial Ozempic. 06/22/21 Dr [name] OV: f/u DM. A1c 8.6 > 9.3%; increased Jardiance to 25 mg. RTC 2 weeks for BMP on higher Jardiance dose 04/29/21 - PCP message - Back pain, trial robaxin PRN   Recent consult visits:  10/26/21 Dr Erlene Quan (Urology): f/u prostate cancer. S/p radiation. Rec calcium/Vitamin D post ADT. Monitor PSA q6 months. 03/22/21 - Endocrinology - A1c improved, 8.6%. Follow up with PCP.   Hospital visits:  None in previous 6  months  Objective:  Lab Results  Component Value Date   CREATININE 1.31 (H) 04/02/2021   BUN 14 04/02/2021   GFR 68.61 02/08/2021   GFRNONAA >60 08/02/2019   GFRAA >60 08/02/2019   NA 140 04/02/2021   K 4.1 04/02/2021   CALCIUM 9.2 04/02/2021   CO2 26 04/02/2021   GLUCOSE 138 (H) 04/02/2021   Lab Results  Component Value Date/Time   HGBA1C 8.1 (A) 10/27/2021 09:41 AM   HGBA1C 9.3 (A) 06/22/2021 09:12 AM   HGBA1C 11.5 (H) 02/08/2021 08:02 AM   HGBA1C 10.6 (H) 01/22/2020 07:28 AM   FRUCTOSAMINE 436 (H) 04/22/2020 08:35 AM   FRUCTOSAMINE 232 08/02/2017 08:26 AM   GFR 68.61 02/08/2021 08:02 AM   GFR 70.48 04/07/2020 08:39 AM   MICROALBUR <0.7 08/27/2014 09:51 AM    Last diabetic Eye exam:  Lab Results  Component Value Date/Time   HMDIABEYEEXA No Retinopathy 11/10/2021 12:00 AM     Lab Results  Component Value Date   CHOL 98 02/08/2021   HDL 26.50 (L) 02/08/2021   LDLCALC 44 10/06/2017   LDLDIRECT 38.0 02/08/2021   TRIG (H) 02/08/2021    409.0 Triglyceride is over 400; calculations on Lipids are invalid.   CHOLHDL 4 02/08/2021       Latest Ref Rng & Units 04/02/2021    4:45 PM 02/08/2021    8:02 AM 04/07/2020    8:39 AM  Hepatic Function  Total Protein 6.1 - 8.1 g/dL 7.4  6.9    Albumin 3.5 - 5.2 g/dL  3.9  4.1   AST 10 - 35 U/L 16  14    ALT 9 - 46 U/L 21  21    Alk Phosphatase 39 - 117 U/L  102    Total Bilirubin 0.2 - 1.2 mg/dL 0.6  0.5      Lab Results  Component Value Date/Time   TSH 1.10 04/07/2020 08:39 AM   TSH 0.716 07/18/2019 11:33 AM   TSH 1.47 10/03/2016 08:17 AM   FREET4 0.91 11/01/2013 03:06 PM   FREET4 0.9 05/27/2009 10:50 AM       Latest Ref Rng & Units 05/26/2021    3:35 PM 04/02/2021    4:45 PM 02/08/2021    8:02 AM  CBC  WBC 4.0 - 10.5 K/uL 7.0  7.9  6.6   Hemoglobin 13.0 - 17.0 g/dL 13.1  13.8  13.0   Hematocrit 39.0 - 52.0 % 39.5  42.2  38.8   Platelets 150 - 400 K/uL 162  182  143.0     Lab Results  Component Value  Date/Time   VD25OH 43.81 02/08/2021 08:02 AM   VD25OH 38.35 01/22/2020 07:28 AM    Clinical ASCVD: No  The ASCVD Risk score (Arnett DK, et al., 2019) failed to calculate for the following reasons:   The valid total cholesterol range is 130 to 320 mg/dL       10/27/2021   10:41 AM 02/15/2021    9:33 AM 01/22/2020    3:30 PM  Depression screen PHQ 2/9  Decreased Interest 0 0 0  Down, Depressed, Hopeless 0 0 0  PHQ - 2 Score 0 0 0  Altered sleeping 3 2 0  Tired, decreased energy 2 2 0  Change in appetite 0 1 0  Feeling bad or failure about yourself  0 0 0  Trouble concentrating 0 0 0  Moving slowly or fidgety/restless 0 0 0  Suicidal thoughts 0 0 0  PHQ-9 Score 5 5 0  Difficult doing work/chores Not difficult at all  Not difficult at all    Social History   Tobacco Use  Smoking Status Never  Smokeless Tobacco Former   Types: Chew   BP Readings from Last 3 Encounters:  11/18/21 139/89  10/27/21 110/70  10/26/21 (!) 172/76   Pulse Readings from Last 3 Encounters:  11/18/21 89  10/27/21 84  10/26/21 87   Wt Readings from Last 3 Encounters:  11/18/21 256 lb (116.1 kg)  10/27/21 256 lb 8 oz (116.3 kg)  10/26/21 260 lb (117.9 kg)   BMI Readings from Last 3 Encounters:  11/18/21 40.70 kg/m  10/27/21 41.40 kg/m  10/26/21 41.97 kg/m    Assessment/Interventions: Review of patient past medical history, allergies, medications, health status, including review of consultants reports, laboratory and other test data, was performed as part of comprehensive evaluation and provision of chronic care management services.   SDOH:  (Social Determinants of Health) assessments and interventions performed: Yes     SDOH Screenings   Alcohol Screen: Low Risk  (01/22/2020)   Alcohol Screen    Last Alcohol Screening Score (AUDIT): 0  Depression (PHQ2-9): Medium Risk (10/27/2021)   Depression (PHQ2-9)    PHQ-2 Score: 5  Financial Resource Strain: Low Risk  (04/13/2021)   Overall  Financial Resource Strain (CARDIA)    Difficulty of Paying Living Expenses: Not very hard  Recent Concern: Financial Resource Strain - Medium Risk (03/02/2021)   Overall Financial Resource Strain (CARDIA)    Difficulty of Paying Living Expenses: Somewhat hard  Food Insecurity: No Food Insecurity (12/03/2021)   Hunger Vital Sign    Worried About Running Out of Food in the Last Year: Never true    Ran Out of Food in the Last Year: Never true  Housing: Low Risk  (01/22/2020)   Housing    Last Housing Risk Score: 0  Physical Activity: Inactive (01/22/2020)   Exercise Vital Sign    Days of Exercise per Week: 0 days    Minutes of Exercise per Session: 0 min  Social Connections: Not on file  Stress: No Stress Concern Present (01/22/2020)   [place]    Feeling of Stress : Not at all  Tobacco Use: Medium Risk (11/18/2021)   Patient History    Smoking Tobacco Use: Never    Smokeless Tobacco Use: Former    Passive Exposure: Not on file  Transportation Needs: No Transportation Needs (12/03/2021)   PRAPARE - Hydrologist (Medical): No    Lack of Transportation (Non-Medical): No    CCM Care Plan  Allergies  Allergen Reactions   Tolmetin Rash    cirrhosis   Ambien [Zolpidem Tartrate] Other (See Comments)    Over-toxicity from liver failure   Morphine And Related     Hallucinations.   Trulicity [Dulaglutide]     frequency   Hydrochlorothiazide W-Triamterene Other (See Comments)    REACTION: dizzy, nausea   Lisinopril Other (See Comments)    REACTION: cough, decreased libido    Medications Reviewed Today     Reviewed by Charlton Haws, [facility] (Pharmacist) on 02/11/22 at 1352  Med List Status: <None>   Medication Order Taking? Sig Documenting Provider Last Dose Status Informant  Accu-Chek FastClix Lancets MISC [id number] Yes USE THREE TIMES DAILY TO CHECK SUGARS E65.11, INSULIN USE Ria Bush, MD Taking Active   ACCU-CHEK GUIDE test strip [id number] Yes USE AS INSTRUCTED TO CHECK SUGARS THREE TIMES DAILY E11.65, INSULIN USE Ria Bush, MD Taking Active   acetaminophen (TYLENOL) 500 MG tablet [id number] Yes Take 500 mg by mouth every 6 (six) hours as needed for mild pain. [provider] Taking Active Spouse/Significant Other  allopurinol (ZYLOPRIM) 300 MG tablet [id number] Yes TAKE 1 TABLET EVERY DAY Ria Bush, MD Taking Active   ASPIR-LOW 81 MG EC tablet [id number] Yes Take 1 tablet (81 mg total) by mouth daily. Ria Bush, MD Taking Active   B-D UF III MINI PEN NEEDLES 31G X 5 MM MISC [id number] Yes USE TO [place] Ria Bush, MD Taking Active   Blood Glucose Monitoring Suppl (ACCU-CHEK GUIDE) w/Device KIT [id number] Yes 1 Units by Does not apply route as directed. Ria Bush, MD Taking Active Spouse/Significant Other  Cholecalciferol (VITAMIN D3) 2000 units TABS [id number] Yes Take 1 tablet by mouth at bedtime. [provider] Taking Active Spouse/Significant Other  Continuous Blood Gluc Sensor (FREESTYLE LIBRE 2 SENSOR) MISC [id number] Yes Apply sensor every 14 days to monitor sugar continously Ria Bush, MD Taking Active            Med Note Malena Catholic Jan 12, 2022 11:56 AM) Pt received device - not started yet  docusate sodium (COLACE) 100 MG capsule [id number] Yes Take 100 mg by mouth daily as needed for mild constipation.  [provider] Taking Active Spouse/Significant Other  empagliflozin (JARDIANCE) 25 MG TABS tablet [id number] Yes Take 1 tablet (25 mg total) by mouth daily before breakfast. Ria Bush, MD  Taking Active   escitalopram (LEXAPRO) 10 MG tablet [id number] Yes TAKE 1 TABLET EVERY DAY Ria Bush, MD Taking Active   fluticasone [facility]) 50 MCG/ACT nasal spray [id number] Yes Place 2 sprays into both nostrils daily. Ria Bush, MD Taking Active    furosemide (LASIX) 40 MG tablet [id number] Yes Take 1 tablet (40 mg total) by mouth daily. Freada Bergeron, MD Taking Active   glucose blood (ACCU-CHEK GUIDE) test strip [id number] Yes Check sugars three times daily and as needed when feeling ill E11.65, insulin use Ria Bush, MD Taking Active Spouse/Significant Other  insulin glargine (LANTUS SOLOSTAR) 100 UNIT/ML Solostar Pen [id number] Yes Inject 55 Units into the skin 2 (two) times daily. And pen needles 1/day Ria Bush, MD Taking Active   loratadine (CLARITIN) 10 MG tablet [id number] Yes Take 10 mg by mouth daily. [provider] Taking Active Spouse/Significant Other  losartan (COZAAR) 50 MG tablet [id number] Yes TAKE 1 TABLET EVERY DAY Ria Bush, MD Taking Active   lovastatin (MEVACOR) 20 MG tablet [id number] Yes TAKE 1 TABLET (20 MG TOTAL) BY MOUTH AT BEDTIME. Ria Bush, MD Taking Active   magnesium oxide (MAG-OX) 400 (240 Mg) MG tablet [id number] Yes TAKE 1 TABLET BY MOUTH TWICE A Velora Heckler, MD Taking Active   metFORMIN (GLUCOPHAGE-XR) 500 MG 24 hr tablet [id number] Yes Take 1 tablet (500 mg total) by mouth 2 (two) times daily. Ria Bush, MD Taking Active   methocarbamol (ROBAXIN) 500 MG tablet [id number] Yes Take 1 tablet (500 mg total) by mouth 3 (three) times daily as needed for muscle spasms (sedation precautions). Ria Bush, MD Taking Active   Multiple Vitamins-Minerals ([employer] SPECTRAVITE PO) [id number] Yes Take 1 tablet by mouth at bedtime. [provider] Taking Active Spouse/Significant Other  mycophenolate (MYFORTIC) 360 MG TBEC EC tablet [id number] Yes Take 360 mg by mouth 2 (two) times daily. [provider] Taking Active   Omega-3 Fatty Acids (FISH OIL PO) [id number] Yes Take 1 tablet by mouth at bedtime. [provider] Taking Active Spouse/Significant Other  omeprazole (PRILOSEC) 40 MG capsule [id number] Yes Take 1 capsule (40 mg total) by mouth  daily. Ria Bush, MD Taking Active   potassium chloride (KLOR-CON M) 10 MEQ tablet [id number] Yes Take by mouth. [provider] Taking Active   tacrolimus (PROGRAF) 1 MG capsule [id number] Yes Take 1-2 mg by mouth See admin instructions. Take 2 mg by mouth in the morning, then take 1 mg  by mouth in the evening [provider] Taking Active Spouse/Significant Other            Patient Active Problem List   Diagnosis Date Noted   Sinus congestion 06/22/2021   Diarrhea of presumed infectious origin 04/02/2021   Medicare annual wellness visit, subsequent 02/15/2021   Immunosuppressed status ([place]) 11/10/2020   Chronic right-sided thoracic back pain 04/07/2020   Memory loss 04/07/2020   Perianal pruritus 02/06/2020   Prostate cancer ([place]) 02/06/2020   Advanced directives, counseling/discussion 02/06/2020   Pain, dental 08/21/2019   Orthostatic hypotension 07/31/2019   Syncope 07/30/2019   Left rib fracture 07/27/2019   Pneumonia due to COVID-19 virus 07/18/2019   [place] lesion, left 02/05/2018   Acute sinusitis 10/23/2017   Chronic heel pain, left 10/14/2017   Anejaculation 08/09/2017   Chronic gout 05/22/2017   Dyspnea 11/18/2016   BPH (benign prostatic hyperplasia) 10/05/2016   Status post liver transplant ([place]) 03/15/2016   Portal hypertensive gastropathy ([place]) 12/04/2015   Type 2 diabetes mellitus with other  specified complication ([place]) 91/63/8466   Gynecomastia    CKD (chronic kidney disease) stage 3, GFR 30-59 ml/min (HCC) 11/04/2015   Depression, major, single episode, moderate ([place]) 07/09/2015   Headache 02/19/2015   Insomnia 12/06/2014   Hypomagnesemia    Other fatigue 11/18/2014   Internal hemorrhoid 10/01/2014   Erectile dysfunction 59/93/5701   Alcoholic cirrhosis of liver without ascites ([place]) 07/28/2014   (HFpEF) heart failure with preserved ejection fraction ([place]) 11/01/2013   Essential hypertension 06/04/2013   OSA on CPAP 07/11/2012    History of alcohol dependence ([place]) 07/11/2012   Thrombocytopenia ([place]) 12/15/2011   Morbid obesity with BMI of 40.0-44.9, adult ([place]) 11/16/2011   Routine general medical examination at a health care facility 09/22/2010   NEUROPATHY 05/27/2009   Pedal edema 09/22/2008   Dyslipidemia associated with type 2 diabetes mellitus ([place]) 02/15/2007   Allergic rhinitis 02/15/2007   GERD 02/15/2007    Immunization History  Administered Date(s) Administered   Hep A / Hep B 01/16/2015, 01/23/2015, 02/06/2015   Hepatitis B, adult 12/04/2015   Influenza Whole 03/24/2010   Influenza,inj,Quad PF,6+ Mos 04/22/2015, 04/06/2017, 04/23/2018, 04/23/2019, 04/07/2020, 06/22/2021   PFIZER(Purple Top)SARS-COV-2 Vaccination 11/07/2019, 11/28/2019, 05/07/2020, 12/04/2020   PNEUMOCOCCAL CONJUGATE-20 02/15/2021   Pneumococcal Polysaccharide-23 04/22/2015   Td 05/01/2007   Tdap 12/14/2015, 10/05/2020    Conditions to be addressed/monitored:  Hypertension, Hyperlipidemia, and Diabetes  Care Plan : [place]  Updates made by Charlton Haws, [place] since 02/11/2022 12:00 AM     Problem: Hypertension, Hyperlipidemia, and Diabetes   Priority: High     Long-Range Goal: Disease Management   Start Date: 03/02/2021  Expected End Date: 07/30/2022  This Visit's Progress: Not on track  Recent Progress: Not on track  Priority: High  Note:   Current Barriers:  Unable to achieve control of diabetes  Pharmacist Clinical Goal(s):  Patient will contact provider office for questions/concerns as evidenced notation of same in electronic health record through collaboration with PharmD and provider.   Interventions: 1:1 collaboration with Ria Bush, MD regarding development and update of comprehensive plan of care as evidenced by provider attestation and co-signature Inter-disciplinary care team collaboration (see longitudinal plan of care) Comprehensive medication review performed; medication  list updated in electronic medical record  Hypertension / HFpEF (BP goal <140/90) -Controlled - BP at goal; Denies hypotensive/hypertensive symptoms -Last ejection fraction: 65-70% (Date: 07/2019) -HF type: Diastolic;  NYHA Class: II (slight limitation of activity) -Current treatment: Losartan 50 mg daily - Appropriate, Effective, Safe, Accessible Furosemide 40 mg daily - Appropriate, Effective, Safe, Accessible -Educated on BP goals and benefits of medications for prevention of heart attack, stroke and kidney damage; -Counseled to monitor BP at home periodically -Recommended to continue current medication   Hyperlipidemia: (LDL goal < 100) -Controlled - LDL 38 (01/2021); but TG elevated above goal secondary to DM. -Current treatment: Lovastatin 20 mg daily HS - Appropriate, Effective, Safe, Accessible -Medications previously tried: none  -Recommended to continue current medication  Diabetes (A1c goal <7%) -Uncontrolled, improving - A1c 8.1% (10/2021) improved from 9.6%;  he received Freestyle Libre 2 but has not put it on yet (his wife uses FL3 and can show him how to use it); pt decided he does not want to start Ozempic and will work on diet to improve DM -Current home glucose readings: 190-214, checking AM and post-prandial -Current medications: Jardiance 25 mg daily (PAP) - Appropriate, Effective, Safe, Accessible Lantus Solostar 55 units BID-Appropriate, Query Effective Metformin XR 500 mg  BID -Appropriate, Effective, Safe, Accessible Freestyle Libre 2 -Medications previously tried: Musician (diarrhea, cost), Ozempic (never started, declined PAP) -Pt is taking 0.94 u/kg/day of basal insulin, would consider this over-basalization and further increasing the dose will probably not help with BG; at this point either GLP-1 agonist or prandial insulin is recommended next -Reviewed benefits of Ozempic and CGM; pt voiced he will start using Freestyle Libre and wants to work on  diet -Recommend to continue current medication  Health Maintenance -Vaccine gaps: Covid booster, Shingrix -Prostate cancer Dx 02/2021, completed radiation, now cancer free. Follows with urology. 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 Follows w [facility] (NP [place], Dr Zollie Scale)  Patient Goals/Self-Care Activities Patient will:  - take medications as prescribed as evidenced by patient report and record review focus on medication adherence by routine check glucose twice daily, document, and provide at future appointments engage in dietary modifications by limiting carbs, sugar    Medication Assistance:  Lantus ([employer] PAP) Jardiance ([employer]) - approved through 07/25/22  Member ID: [id number] Group ID: [id number] RX BIN: [id number] PCN: [id number]  Compliance/Adherence/Medication fill history: Care Gaps: Eye exam due   Star rating drugs: Metformin XR 536m - PDC 96% Losartan 541m- PDC 100% Lovastatin 2050m PDC 100% Jardiance - PDC 89%; LF 10/16/21 x 90 ds  Medication Access: Within the past 30 days, how often has patient missed a dose of medication? 0 Is a pillbox or other method used to improve adherence? Yes  Factors that may affect medication adherence? financial need Are meds synced by current pharmacy? No  Are meds delivered by current pharmacy? Yes  Does patient experience delays in picking up medications due to transportation concerns? No   [facility] Reviewed: Is patient disadvantaged to use [facility]?: Yes  Current Rx insurance plan: [employer] [place] Name and location of Current pharmacy[facility]  [id number] [place] [id number]: [phone]: [phone] [place] [address] [id number]: [phone]: [phone]  Pharmacy services reviewed with patient today?: No  Patient requests to transfer care to [facility]?: No  Reason patient declined to change pharmacies: Disadvantaged due to insurance/mail order   Care Plan and Follow Up Patient Decision:  Patient agrees to Care Plan and Follow-up.  Follow Up Plan: Telephone follow up appointment with care management team member scheduled for: 2 month  LindCharlene BrookearmD, BCACP Clinical Pharmacist [facility]-[phone]

## 2022-02-11 NOTE — Patient Instructions (Signed)
Visit Information  Phone number for Pharmacist: [phone]   Goals Addressed   None     Care Plan : [place]  Updates made by Charlton Haws, RPH since 02/11/2022 12:00 AM     Problem: Hypertension, Hyperlipidemia, and Diabetes   Priority: High     Long-Range Goal: Disease Management   Start Date: 03/02/2021  Expected End Date: 07/30/2022  This Visit's Progress: Not on track  Recent Progress: Not on track  Priority: High  Note:   Current Barriers:  Unable to achieve control of diabetes  Pharmacist Clinical Goal(s):  Patient will contact provider office for questions/concerns as evidenced notation of same in electronic health record through collaboration with PharmD and provider.   Interventions: 1:1 collaboration with Ria Bush, MD regarding development and update of comprehensive plan of care as evidenced by provider attestation and co-signature Inter-disciplinary care team collaboration (see longitudinal plan of care) Comprehensive medication review performed; medication list updated in electronic medical record  Hypertension / HFpEF (BP goal <140/90) -Controlled - BP at goal; Denies hypotensive/hypertensive symptoms -Last ejection fraction: 65-70% (Date: 07/2019) -HF type: Diastolic;  NYHA Class: II (slight limitation of activity) -Current treatment: Losartan 50 mg daily - Appropriate, Effective, Safe, Accessible Furosemide 40 mg daily - Appropriate, Effective, Safe, Accessible -Educated on BP goals and benefits of medications for prevention of heart attack, stroke and kidney damage; -Counseled to monitor BP at home periodically -Recommended to continue current medication   Hyperlipidemia: (LDL goal < 100) -Controlled - LDL 38 (01/2021); but TG elevated above goal secondary to DM. -Current treatment: Lovastatin 20 mg daily HS - Appropriate, Effective, Safe, Accessible -Medications previously tried: none  -Recommended to continue current  medication  Diabetes (A1c goal <7%) -Uncontrolled, improving - A1c 8.1% (10/2021) improved from 9.6%;  he received Freestyle Libre 2 but has not put it on yet (his wife uses FL3 and can show him how to use it); pt decided he does not want to start Ozempic and will work on diet to improve DM -Current home glucose readings: 190-214, checking AM and post-prandial -Current medications: Jardiance 25 mg daily (PAP) - Appropriate, Effective, Safe, Accessible Lantus Solostar 55 units BID-Appropriate, Query Effective Metformin XR 500 mg BID -Appropriate, Effective, Safe, Accessible Freestyle Libre 2 -Medications previously tried: Musician (diarrhea, cost), Ozempic (never started, declined PAP) -Pt is taking 0.94 u/kg/day of basal insulin, would consider this over-basalization and further increasing the dose will probably not help with BG; at this point either GLP-1 agonist or prandial insulin is recommended next -Reviewed benefits of Ozempic and CGM; pt voiced he will start using [place] and wants to work on diet -Recommend to continue current medication  Health Maintenance -Vaccine gaps: Covid booster, Shingrix -Prostate cancer Dx 02/2021, completed radiation, now cancer free. Follows with urology. 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 Follows w [place] [facility] clinic (NP [place], Dr Zollie Scale)  Patient Goals/Self-Care Activities Patient will:  - take medications as prescribed as evidenced by patient report and record review focus on medication adherence by routine check glucose twice daily, document, and provide at future appointments engage in dietary modifications by limiting carbs, sugar      Patient verbalizes understanding of instructions and care plan provided today and agrees to view in [place]. Active MyChart status and patient understanding of how to access instructions and care plan via MyChart confirmed with patient.    Telephone follow up  appointment with pharmacy team member scheduled for: 2 months  Charlene Brooke, PharmD, BCACP Clinical Pharmacist [facility] [phone]

## 2022-02-19 ENCOUNTER — Other Ambulatory Visit: Payer: Self-pay | Admitting: Family Medicine

## 2022-02-19 DIAGNOSIS — N1831 Chronic kidney disease, stage 3a: Secondary | ICD-10-CM

## 2022-02-19 DIAGNOSIS — E1169 Type 2 diabetes mellitus with other specified complication: Secondary | ICD-10-CM

## 2022-02-19 DIAGNOSIS — C61 Malignant neoplasm of prostate: Secondary | ICD-10-CM

## 2022-02-19 DIAGNOSIS — D849 Immunodeficiency, unspecified: Secondary | ICD-10-CM

## 2022-02-19 DIAGNOSIS — Z944 Liver transplant status: Secondary | ICD-10-CM

## 2022-02-19 DIAGNOSIS — M1A0790 Idiopathic chronic gout, unspecified ankle and foot, without tophus (tophi): Secondary | ICD-10-CM

## 2022-02-22 ENCOUNTER — Other Ambulatory Visit (INDEPENDENT_AMBULATORY_CARE_PROVIDER_SITE_OTHER): Payer: Medicare HMO

## 2022-02-22 DIAGNOSIS — E1169 Type 2 diabetes mellitus with other specified complication: Secondary | ICD-10-CM | POA: Diagnosis not present

## 2022-02-22 DIAGNOSIS — Z794 Long term (current) use of insulin: Secondary | ICD-10-CM | POA: Diagnosis not present

## 2022-02-22 DIAGNOSIS — M1A0790 Idiopathic chronic gout, unspecified ankle and foot, without tophus (tophi): Secondary | ICD-10-CM

## 2022-02-22 DIAGNOSIS — N1831 Chronic kidney disease, stage 3a: Secondary | ICD-10-CM

## 2022-02-22 DIAGNOSIS — E785 Hyperlipidemia, unspecified: Secondary | ICD-10-CM

## 2022-02-22 DIAGNOSIS — Z944 Liver transplant status: Secondary | ICD-10-CM

## 2022-02-22 LAB — COMPREHENSIVE METABOLIC PANEL
ALT: 21 U/L (ref 0–53)
AST: 16 U/L (ref 0–37)
Albumin: 3.8 g/dL (ref 3.5–5.2)
Alkaline Phosphatase: 91 U/L (ref 39–117)
BUN: 15 mg/dL (ref 6–23)
CO2: 26 mEq/L (ref 19–32)
Calcium: 9.4 mg/dL (ref 8.4–10.5)
Chloride: 103 mEq/L (ref 96–112)
Creatinine, Ser: 1.2 mg/dL (ref 0.40–1.50)
GFR: 68.11 mL/min (ref >60.00)
Glucose, Bld: 193 mg/dL — ABNORMAL HIGH (ref 70–99)
Potassium: 4 mEq/L (ref 3.5–5.1)
Sodium: 141 mEq/L (ref 135–145)
Total Bilirubin: 0.4 mg/dL (ref 0.2–1.2)
Total Protein: 6.9 g/dL (ref 6.0–8.3)

## 2022-02-22 LAB — CBC WITH DIFFERENTIAL/PLATELET
Basophils Absolute: 0 10*3/uL (ref 0.0–0.1)
Basophils Relative: 0.8 % (ref 0.0–3.0)
Eosinophils Absolute: 0.2 10*3/uL (ref 0.0–0.7)
Eosinophils Relative: 2.9 % (ref 0.0–5.0)
HCT: 35.4 % — ABNORMAL LOW (ref 39.0–52.0)
Hemoglobin: 12 g/dL — ABNORMAL LOW (ref 13.0–17.0)
Lymphocytes Relative: 21.9 % (ref 12.0–46.0)
Lymphs Abs: 1.2 10*3/uL (ref 0.7–4.0)
MCHC: 33.9 g/dL (ref 30.0–36.0)
MCV: 86.8 fl (ref 78.0–100.0)
Monocytes Absolute: 0.3 10*3/uL (ref 0.1–1.0)
Monocytes Relative: 6 % (ref 3.0–12.0)
Neutro Abs: 3.7 10*3/uL (ref 1.4–7.7)
Neutrophils Relative %: 68.4 % (ref 43.0–77.0)
Platelets: 131 10*3/uL — ABNORMAL LOW (ref 150.0–400.0)
RBC: 4.08 Mil/uL — ABNORMAL LOW (ref 4.22–5.81)
RDW: 15.1 % (ref 11.5–15.5)
WBC: 5.4 10*3/uL (ref 4.0–10.5)

## 2022-02-22 LAB — LIPID PANEL
Cholesterol: 102 mg/dL (ref 0–200)
HDL: 28.2 mg/dL — ABNORMAL LOW (ref >39.00)
Total CHOL/HDL Ratio: 4
Triglycerides: 543 mg/dL — ABNORMAL HIGH (ref 0.0–149.0)

## 2022-02-22 LAB — MAGNESIUM: Magnesium: 1.7 mg/dL (ref 1.5–2.5)

## 2022-02-22 LAB — URIC ACID: Uric Acid, Serum: 4.8 mg/dL (ref 4.0–7.8)

## 2022-02-22 LAB — LDL CHOLESTEROL, DIRECT: Direct LDL: 33 mg/dL

## 2022-02-22 LAB — VITAMIN D 25 HYDROXY (VIT D DEFICIENCY, FRACTURES): VITD: 32.34 ng/mL (ref 30.00–100.00)

## 2022-02-22 LAB — HEMOGLOBIN A1C: Hgb A1c MFr Bld: 8.9 % — ABNORMAL HIGH (ref 4.6–6.5)

## 2022-02-23 LAB — MICROALBUMIN / CREATININE URINE RATIO
Creatinine,U: 93.1 mg/dL
Microalb Creat Ratio: 0.8 mg/g (ref 0.0–30.0)
Microalb, Ur: 0.8 mg/dL (ref 0.0–1.9)

## 2022-02-24 DIAGNOSIS — E1159 Type 2 diabetes mellitus with other circulatory complications: Secondary | ICD-10-CM

## 2022-02-24 DIAGNOSIS — I5030 Unspecified diastolic (congestive) heart failure: Secondary | ICD-10-CM

## 2022-02-24 DIAGNOSIS — E785 Hyperlipidemia, unspecified: Secondary | ICD-10-CM

## 2022-02-24 DIAGNOSIS — Z794 Long term (current) use of insulin: Secondary | ICD-10-CM

## 2022-02-24 DIAGNOSIS — I110 Hypertensive heart disease with heart failure: Secondary | ICD-10-CM | POA: Diagnosis not present

## 2022-03-01 ENCOUNTER — Other Ambulatory Visit: Payer: Self-pay

## 2022-03-01 ENCOUNTER — Encounter: Payer: Self-pay | Admitting: Family Medicine

## 2022-03-01 ENCOUNTER — Ambulatory Visit (INDEPENDENT_AMBULATORY_CARE_PROVIDER_SITE_OTHER): Payer: Medicare HMO | Admitting: Family Medicine

## 2022-03-01 VITALS — BP 120/72 | HR 76 | Temp 97.9°F | Ht 66.5 in | Wt 258.2 lb

## 2022-03-01 DIAGNOSIS — D696 Thrombocytopenia, unspecified: Secondary | ICD-10-CM | POA: Diagnosis not present

## 2022-03-01 DIAGNOSIS — C61 Malignant neoplasm of prostate: Secondary | ICD-10-CM

## 2022-03-01 DIAGNOSIS — D649 Anemia, unspecified: Secondary | ICD-10-CM | POA: Diagnosis not present

## 2022-03-01 DIAGNOSIS — Z944 Liver transplant status: Secondary | ICD-10-CM

## 2022-03-01 DIAGNOSIS — Z6841 Body Mass Index (BMI) 40.0 and over, adult: Secondary | ICD-10-CM | POA: Diagnosis not present

## 2022-03-01 DIAGNOSIS — Z23 Encounter for immunization: Secondary | ICD-10-CM

## 2022-03-01 DIAGNOSIS — Z794 Long term (current) use of insulin: Secondary | ICD-10-CM

## 2022-03-01 DIAGNOSIS — I10 Essential (primary) hypertension: Secondary | ICD-10-CM

## 2022-03-01 DIAGNOSIS — I5032 Chronic diastolic (congestive) heart failure: Secondary | ICD-10-CM

## 2022-03-01 DIAGNOSIS — N1831 Chronic kidney disease, stage 3a: Secondary | ICD-10-CM

## 2022-03-01 DIAGNOSIS — M1A0790 Idiopathic chronic gout, unspecified ankle and foot, without tophus (tophi): Secondary | ICD-10-CM

## 2022-03-01 DIAGNOSIS — Z0000 Encounter for general adult medical examination without abnormal findings: Secondary | ICD-10-CM

## 2022-03-01 DIAGNOSIS — F321 Major depressive disorder, single episode, moderate: Secondary | ICD-10-CM

## 2022-03-01 DIAGNOSIS — D849 Immunodeficiency, unspecified: Secondary | ICD-10-CM

## 2022-03-01 DIAGNOSIS — E1169 Type 2 diabetes mellitus with other specified complication: Secondary | ICD-10-CM

## 2022-03-01 DIAGNOSIS — Z7189 Other specified counseling: Secondary | ICD-10-CM

## 2022-03-01 DIAGNOSIS — K7030 Alcoholic cirrhosis of liver without ascites: Secondary | ICD-10-CM

## 2022-03-01 DIAGNOSIS — G4733 Obstructive sleep apnea (adult) (pediatric): Secondary | ICD-10-CM

## 2022-03-01 LAB — POC URINALSYSI DIPSTICK (AUTOMATED)
Bilirubin, UA: NEGATIVE
Blood, UA: NEGATIVE
Glucose, UA: POSITIVE — AB
Ketones, UA: NEGATIVE
Leukocytes, UA: NEGATIVE
Nitrite, UA: NEGATIVE
Protein, UA: NEGATIVE
Spec Grav, UA: 1.015 (ref 1.010–1.025)
Urobilinogen, UA: 0.2 E.U./dL
pH, UA: 6 (ref 5.0–8.0)

## 2022-03-01 MED ORDER — FUROSEMIDE 40 MG PO TABS: 40.0000 mg | ORAL_TABLET | Freq: Every day | ORAL | 0 refills | Status: DC

## 2022-03-01 NOTE — Assessment & Plan Note (Signed)
Chronic, stable on allopurinol '300mg'$  daily without recent gout flare.

## 2022-03-01 NOTE — Assessment & Plan Note (Signed)
Continue lasix and jardiance. Sees cardiology yearly.

## 2022-03-01 NOTE — Assessment & Plan Note (Addendum)
Chronic on lovastatin, trig elevated, hyperglycemia contributes. ?food related - will recheck [facility]ink next labwork. Consider Vascepa.  The ASCVD Risk score (Arnett DK, et al., 2019) failed to calculate for the following reasons:   The valid total cholesterol range is 130 to 320 mg/dL

## 2022-03-01 NOTE — Patient Instructions (Addendum)
Pass by lab for stool kit. Urinalysis today.  Flu shot today  If interested, check with pharmacy about new 2 shot shingles series (shingrix).  Consider COVID shot at pharmacy.  Retry Freestyle Libre 2. Let me know how you do with this.  Let me know prior to running out of lantus to discuss possible change in insulin brand.  Good to see you today Return as needed or in 4 months for diabetes follow up visit.   Health Maintenance, Male Adopting a healthy lifestyle and getting preventive care are important in promoting health and wellness. Ask your health care provider about: The right schedule for you to have regular tests and exams. Things you can do on your own to prevent diseases and keep yourself healthy. What should I know about diet, weight, and exercise? Eat a healthy diet  Eat a diet that includes plenty of vegetables, fruits, low-fat dairy products, and lean protein. Do not eat a lot of foods that are high in solid fats, added sugars, or sodium. Maintain a healthy weight Body mass index (BMI) is a measurement that can be used to identify possible weight problems. It estimates body fat based on height and weight. Your health care provider can help determine your BMI and help you achieve or maintain a healthy weight. Get regular exercise Get regular exercise. This is one of the most important things you can do for your health. Most adults should: Exercise for at least 150 minutes each week. The exercise should increase your heart rate and make you sweat (moderate-intensity exercise). Do strengthening exercises at least twice a week. This is in addition to the moderate-intensity exercise. Spend less time sitting. Even light physical activity can be beneficial. Watch cholesterol and blood lipids Have your blood tested for lipids and cholesterol at 55 years of age, then have this test every 5 years. You may need to have your cholesterol levels checked more often if: Your lipid or  cholesterol levels are high. You are older than 55 years of age. You are at high risk for heart disease. What should I know about cancer screening? Many types of cancers can be detected early and may often be prevented. Depending on your health history and family history, you may need to have cancer screening at various ages. This may include screening for: Colorectal cancer. Prostate cancer. Skin cancer. Lung cancer. What should I know about heart disease, diabetes, and high blood pressure? Blood pressure and heart disease High blood pressure causes heart disease and increases the risk of stroke. This is more likely to develop in people who have high blood pressure readings or are overweight. Talk with your health care provider about your target blood pressure readings. Have your blood pressure checked: Every 3-5 years if you are 37-62 years of age. Every year if you are 52 years old or older. If you are between the ages of 54 and 15 and are a current or former smoker, ask your health care provider if you should have a one-time screening for abdominal aortic aneurysm (AAA). Diabetes Have regular diabetes screenings. This checks your fasting blood sugar level. Have the screening done: Once every three years after age 56 if you are at a normal weight and have a low risk for diabetes. More often and at a younger age if you are overweight or have a high risk for diabetes. What should I know about preventing infection? Hepatitis B If you have a higher risk for hepatitis B, you should be screened  for this virus. Talk with your health care provider to find out if you are at risk for hepatitis B infection. Hepatitis C Blood testing is recommended for: Everyone born from [birth year] through 1964. Anyone with known risk factors for hepatitis C. Sexually transmitted infections (STIs) You should be screened each year for STIs, including gonorrhea and chlamydia, if: You are sexually active and are younger  than 55 years of age. You are older than 55 years of age and your health care provider tells you that you are at risk for this type of infection. Your sexual activity has changed since you were last screened, and you are at increased risk for chlamydia or gonorrhea. Ask your health care provider if you are at risk. Ask your health care provider about whether you are at high risk for HIV. Your health care provider may recommend a prescription medicine to help prevent HIV infection. If you choose to take medicine to prevent HIV, you should first get tested for HIV. You should then be tested every 3 months for as long as you are taking the medicine. Follow these instructions at home: Alcohol use Do not drink alcohol if your health care provider tells you not to drink. If you drink alcohol: Limit how much you have to 0-2 drinks a day. Know how much alcohol is in your drink. In the [place] one drink equals one 12 oz bottle of beer (355 mL), one 5 oz glass of wine (148 mL), or one 1 oz glass of hard liquor (44 mL). Lifestyle Do not use any products that contain nicotine or tobacco. These products include cigarettes, chewing tobacco, and vaping devices, such as e-cigarettes. If you need help quitting, ask your health care provider. Do not use street drugs. Do not share needles. Ask your health care provider for help if you need support or information about quitting drugs. General instructions Schedule regular health, dental, and eye exams. Stay current with your vaccines. Tell your health care provider if: You often feel depressed. You have ever been abused or do not feel safe at home. Summary Adopting a healthy lifestyle and getting preventive care are important in promoting health and wellness. Follow your health care provider's instructions about healthy diet, exercising, and getting tested or screened for diseases. Follow your health care provider's instructions on monitoring your cholesterol and  blood pressure. This information is not intended to replace advice given to you by your health care provider. Make sure you discuss any questions you have with your health care provider. Document Revised: 11/02/2020 Document Reviewed: 11/02/2020 Elsevier Patient Education  [place].

## 2022-03-01 NOTE — Progress Notes (Signed)
Patient ID: Colton Porter, male    DOB: 1965/09/22, 56 y.o.   MRN: [id number]  This visit was conducted in person.  BP 120/72   Pulse 76   Temp 97.9 F (36.6 C) (Temporal)   Ht 5' 6.5" (1.689 m)   Wt 258 lb 4 oz (117.1 kg)   SpO2 94%   BMI 41.06 kg/m    CC: CPE/AMW Subjective:   HPI: Colton Porter is a 56 y.o. male presenting on 03/01/2022 for Medicare Wellness   Did not see health advisor this year.   Hearing Screening   _0  _1  _2  _3   Right ear _4 0  Left ear _5 40  Vision Screening - Comments:: Last eye exam, 10/2021.  [facility] Office Visit from 03/01/2022 in [facility] [facility]  PHQ-2 Total Score 0          03/01/2022    8:07 AM 02/15/2021    9:45 AM 01/22/2020    3:29 PM 10/15/2018    8:53 AM 10/06/2017   10:17 AM  Fall Risk   Falls in the past year? 1 0 1 0 No  Comment   passed out    Number falls in past yr: 0 0 0    Injury with Fall? 0 0 1    Comment   broke rib    Risk for fall due to :   Medication side effect;Impaired balance/gait    Follow up   Falls evaluation completed;Falls prevention discussed    Missed a step going down stairs with resultant fall. Back pain since then.  Chronic R back pain. Told due to osteoarthritis. Back spasms have since improved.   Known type 2 diabetic, chronic HFpEF, CKD stage 3 and alcoholic cirrhosis s/p liver transplant, on myfortic and prograf.  Prostate cancer diagnosed 02/2021 - on Eligard depo Q4moand completed 40 treatments of external beam radiation therapy on 06/2021. Latest PSA undetectable 11/2021.   DM - previously saw endo, currently on jardiance 272mdaily, metformin XR 50042mID, and lantus 55u bid. Trulicity was unaffordable and caused diarrhea. We tried ozempic - decided not to try this - he's changed diet - mainly drinking diet coke as well as powerade zero, eating fruits. Latest prescribed freestyle libre 2 through our [facility] program - didn't like this.    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 Symptoms of nocturia x1-2 present. No stream weaknening noted.  Lung cancer screening - not eligible  Flu shot [place], 11/[place], booster x2 04/2020, 11/2020, considering bivalent booster Pneumovax 2016. Prevnar-20 01/2021.  Td 2008, Tdap 2017, 09/2020  Shingrix - to consider  Hep A/B series completed  Advanced directives: does not have at home. Wife RonFaythe Dingwallen daughter KimCampbell Stalluld be HCPOA. Full code, but would not want prolonged life support if terminal condition. Advanced directive packet previously provided.  Seat belt use  Sunscreen use. No changing moles on skin. Sees derm.  Non smoker. Quit chewing tobacco 10/10/2015.  Alcohol - abstinent since 10/10/2014.  Dentist - yearly - currently undergoing dental extractions.  Eye exam - yearly diabetic retinopathy screen through our office Bowel - no constipation Bladder - no incontinence    Lives with wife Occupation: full disability after cirrhosis dx, some working  Activity: some yardwork Diet: good water, good fruit intake     Relevant past medical,  surgical, family and social history reviewed and updated as indicated. Interim medical history since our last visit reviewed. Allergies and medications reviewed and updated. Outpatient Medications Prior to Visit  Medication Sig Dispense Refill   Accu-Chek FastClix Lancets MISC USE THREE TIMES DAILY TO CHECK SUGARS E65.11, INSULIN USE 102 each 12   ACCU-CHEK GUIDE test strip USE AS INSTRUCTED TO CHECK SUGARS THREE TIMES DAILY E11.65, INSULIN USE 100 strip 12   acetaminophen (TYLENOL) 500 MG tablet Take 500 mg by mouth every 6 (six) hours as needed for mild pain.     allopurinol (ZYLOPRIM) 300 MG tablet TAKE 1 TABLET EVERY DAY 90 tablet 1   ASPIR-LOW 81 MG EC tablet Take  1 tablet (81 mg total) by mouth daily. 90 tablet 3   B-D UF III MINI PEN NEEDLES 31G X 5 MM MISC USE TO INJECT INSULIN DAILY 100 each 3   Blood Glucose Monitoring Suppl (ACCU-CHEK GUIDE) w/Device KIT 1 Units by Does not apply route as directed. 1 kit 0   Cholecalciferol (VITAMIN D3) 2000 units TABS Take 1 tablet by mouth at bedtime.     Continuous Blood Gluc Sensor (FREESTYLE LIBRE 2 SENSOR) MISC Apply sensor every 14 days to monitor sugar continously 2 each 5   docusate sodium (COLACE) 100 MG capsule Take 100 mg by mouth daily as needed for mild constipation.      empagliflozin (JARDIANCE) 25 MG TABS tablet Take 1 tablet (25 mg total) by mouth daily before breakfast. 90 tablet 3   escitalopram (LEXAPRO) 10 MG tablet TAKE 1 TABLET EVERY DAY 90 tablet 1   fluticasone (FLONASE) 50 MCG/ACT nasal spray Place 2 sprays into both nostrils daily. 16 g 5   glucose blood (ACCU-CHEK GUIDE) test strip Check sugars three times daily and as needed when feeling ill E11.65, insulin use 100 each 11   insulin glargine (LANTUS SOLOSTAR) 100 UNIT/ML Solostar Pen Inject 55 Units into the skin 2 (two) times daily. And pen needles 1/day 90 mL 3   loratadine (CLARITIN) 10 MG tablet Take 10 mg by mouth daily.     losartan (COZAAR) 50 MG tablet TAKE 1 TABLET EVERY DAY 90 tablet 2   lovastatin (MEVACOR) 20 MG tablet TAKE 1 TABLET (20 MG TOTAL) BY MOUTH AT BEDTIME. 90 tablet 2   magnesium oxide (MAG-OX) 400 (240 Mg) MG tablet TAKE 1 TABLET BY MOUTH TWICE A DAY 180 tablet 1   metFORMIN (GLUCOPHAGE-XR) 500 MG 24 hr tablet Take 1 tablet (500 mg total) by mouth 2 (two) times daily. 180 tablet 1   methocarbamol (ROBAXIN) 500 MG tablet Take 1 tablet (500 mg total) by mouth 3 (three) times daily as needed for muscle spasms (sedation precautions). 40 tablet 0   Multiple Vitamins-Minerals ([employer] SPECTRAVITE PO) Take 1 tablet by mouth at bedtime.     mycophenolate (MYFORTIC) 360 MG TBEC EC tablet Take 360 mg by mouth 2 (two) times daily.      Omega-3 Fatty Acids (FISH OIL PO) Take 1 tablet by mouth at bedtime.     omeprazole (PRILOSEC) 40 MG capsule Take 1 capsule (40 mg total) by mouth daily. 90 capsule 3   potassium chloride (KLOR-CON M) 10 MEQ tablet Take by mouth.     tacrolimus (PROGRAF) 1 MG capsule Take 1-2 mg by mouth See admin instructions. Take 2 mg by mouth in the morning, then take 1 mg  by mouth in the evening     furosemide (LASIX) 40 MG tablet Take 1 tablet (  40 mg total) by mouth daily. 90 tablet 0   No facility-administered medications prior to visit.     Per HPI unless specifically indicated in ROS section below Review of Systems  Constitutional:  Negative for activity change, appetite change, chills, fatigue, fever and unexpected weight change.  HENT:  Negative for hearing loss.   Eyes:  Negative for visual disturbance.  Respiratory:  Positive for shortness of breath (occ). Negative for cough, chest tightness and wheezing.   Cardiovascular:  Negative for chest pain, palpitations and leg swelling.  Gastrointestinal:  Negative for abdominal distention, abdominal pain, blood in stool, constipation, diarrhea, nausea and vomiting.  Genitourinary:  Negative for difficulty urinating and hematuria.  Musculoskeletal:  Negative for arthralgias, myalgias and neck pain.  Skin:  Negative for rash.  Neurological:  Positive for dizziness (occ). Negative for seizures, syncope and headaches.  Hematological:  Negative for adenopathy. Bruises/bleeds easily.  Psychiatric/Behavioral:  Negative for dysphoric mood. The patient is not nervous/anxious.     Objective:  BP 120/72   Pulse 76   Temp 97.9 F (36.6 C) (Temporal)   Ht 5' 6.5" (1.689 m)   Wt 258 lb 4 oz (117.1 kg)   SpO2 94%   BMI 41.06 kg/m   Wt Readings from Last 3 Encounters:  03/01/22 258 lb 4 oz (117.1 kg)  11/18/21 256 lb (116.1 kg)  10/27/21 256 lb 8 oz (116.3 kg)      Physical Exam Vitals and nursing note reviewed.  Constitutional:      General:  He is not in acute distress.    Appearance: Normal appearance. He is well-developed. He is not ill-appearing.  HENT:     Head: Normocephalic and atraumatic.     Right Ear: Hearing, tympanic membrane, ear canal and external ear normal.     Left Ear: Hearing, tympanic membrane, ear canal and external ear normal.  Eyes:     General: No scleral icterus.    Extraocular Movements: Extraocular movements intact.     Conjunctiva/sclera: Conjunctivae normal.     Pupils: Pupils are equal, round, and reactive to light.  Neck:     Thyroid: No thyroid mass or thyromegaly.  Cardiovascular:     Rate and Rhythm: Normal rate and regular rhythm.     Pulses: Normal pulses.          Radial pulses are 2+ on the right side and 2+ on the left side.     Heart sounds: Normal heart sounds. No murmur heard. Pulmonary:     Effort: Pulmonary effort is normal. No respiratory distress.     Breath sounds: Normal breath sounds. No wheezing, rhonchi or rales.  Abdominal:     General: Bowel sounds are normal. There is no distension.     Palpations: Abdomen is soft. There is no mass.     Tenderness: There is no abdominal tenderness. There is no guarding or rebound.     Hernia: No hernia is present.  Musculoskeletal:        General: Normal range of motion.     Cervical back: Normal range of motion and neck supple.     Right lower leg: No edema.     Left lower leg: No edema.  Lymphadenopathy:     Cervical: No cervical adenopathy.  Skin:    General: Skin is warm and dry.     Findings: No rash.  Neurological:     General: No focal deficit present.     Mental Status: He is alert  and oriented to person, place, and time.     Comments:  Recall 3/3 Calculation 5/5 DLROW  Psychiatric:        Mood and Affect: Mood normal.        Behavior: Behavior normal.        Thought Content: Thought content normal.        Judgment: Judgment normal.       Results for orders placed or performed in visit on 03/01/22  POCT  Urinalysis Dipstick (Automated)  Result Value Ref Range   Color, UA yellow    Clarity, UA clear    Glucose, UA Positive (A) Negative   Bilirubin, UA negative    Ketones, UA negative    Spec Grav, UA 1.015 1.010 - 1.025   Blood, UA negative    pH, UA 6.0 5.0 - 8.0   Protein, UA Negative Negative   Urobilinogen, UA 0.2 0.2 or 1.0 E.U./dL   Nitrite, UA negative    Leukocytes, UA Negative Negative   Lab Results  Component Value Date   HGBA1C 8.9 (H) 02/22/2022   Lab Results  Component Value Date   CHOL 102 02/22/2022   HDL 28.20 (L) 02/22/2022   LDLCALC 44 10/06/2017   LDLDIRECT 33.0 02/22/2022   TRIG (H) 02/22/2022    543.0 Triglyceride is over 400; calculations on Lipids are invalid.   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 ?food related - will recheck [place] next labwork. Consider Vascepa.  The ASCVD Risk score (Arnett DK, et al., 2019) failed to calculate for the following reasons:   The valid total cholesterol range is 130 to 320 mg/dL       Routine general medical examination at a health care facility (Chronic)    Preventative protocols reviewed and updated unless pt declined. Discussed healthy diet and lifestyle.       Essential hypertension (Chronic)    Chronic, stable - continue losartan 64m  daily with lasix 429mdaily.       Type 2 diabetes mellitus with other specified complication (HCC) (Chronic)    Chronic, not well controlled with latest A1c 8.9%. Intolerant to trulicity, decided not to try ozempic.  Continue Jardiance and Metformin XR 50070mID, along with lantus 55u BID.  He has large dose of insulin at home, will let me know when running low to consider change to more potent Tresiba (U-200) or Toujeo (U-300) to taper insulin as needed. Both seem to be on same tier as lantus.  In interim, he will focus on diabetic diet.  Also recommend restart using Freestyle Libre 2.      Status post liver transplant (HCCPinsonChronic)  Advanced directives, counseling/discussion (Chronic)    Advanced directives: does not have at home. Wife Faythe Dingwall then daughter Campbell Stall would be HCPOA. Full code, but would not want prolonged life support if terminal condition. Advanced directive packet previously provided.       Medicare annual wellness visit, subsequent - Primary (Chronic)    I have personally reviewed the Medicare Annual Wellness questionnaire and have noted 1. The patient's medical and social history 2. Their use of alcohol, tobacco or illicit drugs 3. Their current medications and supplements 4. The patient's functional ability including ADL's, fall risks, home safety risks and hearing or visual impairment. Cognitive function has been assessed and addressed as indicated.  5. Diet and physical activity 6. Evidence for depression or mood disorders The patients weight, height, BMI have been recorded in the chart. I have made referrals, counseling and provided education to the patient based on review of the above and I have provided the pt with a written personalized care plan for preventive services. Provider list updated.. See scanned questionairre as needed for further documentation. Reviewed preventative protocols and updated unless pt declined.       Morbid obesity with  BMI of 40.0-44.9, adult (HCC)   Thrombocytopenia (HCC)    Chronic, mild, stable.       OSA on CPAP    Continue CPAP use nightly. May be overdue for pulm f/u - last seen 06/2017      (HFpEF) heart failure with preserved ejection fraction (HCC)    Continue lasix and jardiance. Sees cardiology yearly.       Alcoholic cirrhosis of liver without ascites (HCC)   Depression, major, single episode, moderate (HCC)    Chronic, continues lexapro $RemoveBeforeDEI'10mg'[id number]$  daily.       CKD (chronic kidney disease) stage 3, GFR 30-59 ml/min (HCC)    Renal function stable with latest GFR 68.       Anemia    Hgb down to 12. Check UA, iFOB.       Relevant Orders   Fecal occult blood, imunochemical   POCT Urinalysis Dipstick (Automated) (Completed)   Chronic gout    Chronic, stable on allopurinol $RemoveBefore'300mg'[id number]$  daily without recent gout flare.       Prostate cancer [facility])    Completed radiation treatment.  Appreciate urology and rad/onc care.       Immunosuppressed status ([place])   Other Visit Diagnoses     Need for influenza vaccination       Relevant Orders   Flu Vaccine QUAD 68mo+IM (Fluarix, Fluzone & Alfiuria Quad PF) (Completed)        No orders of the defined types were placed in this encounter.  Orders Placed This Encounter  Procedures   Fecal occult blood, imunochemical    Standing Status:   Future    Standing Expiration Date:   03/02/2023   Flu Vaccine QUAD 45mo+IM (Fluarix, Fluzone & Alfiuria Quad PF)   POCT Urinalysis Dipstick (Automated)     Patient instructions: Pass by lab for stool kit. Urinalysis today.  Flu shot today  If interested, check with pharmacy about new 2 shot shingles series (shingrix).  Consider COVID shot at pharmacy.  Retry Freestyle Libre 2. Let me know how you do with this.  Let me know prior to running out of lantus to discuss possible change in insulin brand.  Good to see you today Return as needed or in 4 months for diabetes follow up visit.   Follow up  plan: Return in about  4 months (around 07/01/2022) for follow up visit.  Ria Bush, MD

## 2022-03-01 NOTE — Assessment & Plan Note (Signed)

## 2022-03-01 NOTE — Assessment & Plan Note (Signed)
Chronic, continues lexapro '10mg'$  daily.

## 2022-03-01 NOTE — Assessment & Plan Note (Signed)
Renal function stable with latest GFR 68.

## 2022-03-01 NOTE — Assessment & Plan Note (Signed)
Completed radiation treatment.  Appreciate urology and rad/onc care.

## 2022-03-01 NOTE — Assessment & Plan Note (Addendum)
Continue CPAP use nightly. May be overdue for pulm f/u - last seen 06/2017

## 2022-03-01 NOTE — Assessment & Plan Note (Addendum)
Chronic, not well controlled with latest A1c 8.9%. Intolerant to trulicity, decided not to try ozempic.  Continue Jardiance and Metformin XR '500mg'$  BID, along with lantus 55u BID.  He has large dose of insulin at home, will let me know when running low to consider change to more potent Tresiba (U-200) or Toujeo (U-300) to taper insulin as needed. Both seem to be on same tier as lantus.  In interim, he will focus on diabetic diet.  Also recommend restart using Freestyle Libre 2.

## 2022-03-01 NOTE — Assessment & Plan Note (Addendum)
Chronic, stable - continue losartan '50mg'$  daily with lasix '40mg'$  daily.

## 2022-03-01 NOTE — Assessment & Plan Note (Signed)
Preventative protocols reviewed and updated unless pt declined. Discussed healthy diet and lifestyle.  

## 2022-03-01 NOTE — Assessment & Plan Note (Signed)
Chronic, mild, stable.

## 2022-03-01 NOTE — Assessment & Plan Note (Signed)
Hgb down to 12. Check UA, iFOB.

## 2022-03-01 NOTE — Assessment & Plan Note (Addendum)
Advanced directives: does not have at home. Wife Colton Porter then daughter Colton Porter would be HCPOA. Full code, but would not want prolonged life support if terminal condition. Advanced directive packet previously provided.

## 2022-03-14 DIAGNOSIS — Z944 Liver transplant status: Secondary | ICD-10-CM | POA: Diagnosis not present

## 2022-03-14 DIAGNOSIS — Z79899 Other long term (current) drug therapy: Secondary | ICD-10-CM | POA: Diagnosis not present

## 2022-03-14 DIAGNOSIS — D84821 Immunodeficiency due to drugs: Secondary | ICD-10-CM | POA: Diagnosis not present

## 2022-03-14 DIAGNOSIS — Z4823 Encounter for aftercare following liver transplant: Secondary | ICD-10-CM | POA: Diagnosis not present

## 2022-03-19 ENCOUNTER — Other Ambulatory Visit: Payer: Self-pay | Admitting: Family Medicine

## 2022-03-19 DIAGNOSIS — F321 Major depressive disorder, single episode, moderate: Secondary | ICD-10-CM

## 2022-03-19 DIAGNOSIS — K219 Gastro-esophageal reflux disease without esophagitis: Secondary | ICD-10-CM

## 2022-03-19 DIAGNOSIS — I10 Essential (primary) hypertension: Secondary | ICD-10-CM

## 2022-03-21 ENCOUNTER — Other Ambulatory Visit (INDEPENDENT_AMBULATORY_CARE_PROVIDER_SITE_OTHER): Payer: Medicare HMO | Admitting: Radiology

## 2022-03-21 DIAGNOSIS — D649 Anemia, unspecified: Secondary | ICD-10-CM | POA: Diagnosis not present

## 2022-03-23 LAB — FECAL OCCULT BLOOD, IMMUNOCHEMICAL: Fecal Occult Bld: NEGATIVE

## 2022-04-04 ENCOUNTER — Telehealth: Payer: Self-pay

## 2022-04-04 NOTE — Chronic Care Management (AMB) (Signed)
Chronic Care Management Pharmacy Assistant   Name: SABEN DONIGAN  MRN: [id number] DOB: 09-04-1965  Reason for Encounter: Reminder Call    Medications: Outpatient Encounter Medications as of 04/04/2022  Medication Sig Note   Accu-Chek FastClix Lancets MISC USE THREE TIMES DAILY TO CHECK SUGARS E65.11, INSULIN USE    ACCU-CHEK GUIDE test strip USE AS INSTRUCTED TO CHECK SUGARS THREE TIMES DAILY E11.65, INSULIN USE    acetaminophen (TYLENOL) 500 MG tablet Take 500 mg by mouth every 6 (six) hours as needed for mild pain.    allopurinol (ZYLOPRIM) 300 MG tablet TAKE 1 TABLET EVERY DAY    ASPIR-LOW 81 MG EC tablet Take 1 tablet (81 mg total) by mouth daily.    B-D UF III MINI PEN NEEDLES 31G X 5 MM MISC USE TO INJECT INSULIN DAILY    Blood Glucose Monitoring Suppl (ACCU-CHEK GUIDE) w/Device KIT 1 Units by Does not apply route as directed.    Cholecalciferol (VITAMIN D3) 2000 units TABS Take 1 tablet by mouth at bedtime.    Continuous Blood Gluc Sensor (FREESTYLE LIBRE 2 SENSOR) MISC Apply sensor every 14 days to monitor sugar continously 01/12/2022: Pt received device - not started yet   docusate sodium (COLACE) 100 MG capsule Take 100 mg by mouth daily as needed for mild constipation.     empagliflozin (JARDIANCE) 25 MG TABS tablet Take 1 tablet (25 mg total) by mouth daily before breakfast.    escitalopram (LEXAPRO) 10 MG tablet TAKE 1 TABLET EVERY DAY    fluticasone (FLONASE) 50 MCG/ACT nasal spray Place 2 sprays into both nostrils daily.    furosemide (LASIX) 40 MG tablet Take 1 tablet (40 mg total) by mouth daily.    glucose blood (ACCU-CHEK GUIDE) test strip Check sugars three times daily and as needed when feeling ill E11.65, insulin use    insulin glargine (LANTUS SOLOSTAR) 100 UNIT/ML Solostar Pen Inject 55 Units into the skin 2 (two) times daily. And pen needles 1/day    loratadine (CLARITIN) 10 MG tablet Take 10 mg by mouth daily.    losartan (COZAAR) 50 MG tablet TAKE 1 TABLET  EVERY DAY    lovastatin (MEVACOR) 20 MG tablet TAKE 1 TABLET (20 MG TOTAL) BY MOUTH AT BEDTIME.    magnesium oxide (MAG-OX) 400 (240 Mg) MG tablet TAKE 1 TABLET BY MOUTH TWICE A DAY    metFORMIN (GLUCOPHAGE-XR) 500 MG 24 hr tablet Take 1 tablet (500 mg total) by mouth 2 (two) times daily.    methocarbamol (ROBAXIN) 500 MG tablet Take 1 tablet (500 mg total) by mouth 3 (three) times daily as needed for muscle spasms (sedation precautions).    Multiple Vitamins-Minerals (CVS SPECTRAVITE PO) Take 1 tablet by mouth at bedtime.    mycophenolate (MYFORTIC) 360 MG TBEC EC tablet Take 360 mg by mouth 2 (two) times daily.    Omega-3 Fatty Acids (FISH OIL PO) Take 1 tablet by mouth at bedtime.    omeprazole (PRILOSEC) 40 MG capsule TAKE 1 CAPSULE EVERY DAY    potassium chloride (KLOR-CON M) 10 MEQ tablet Take by mouth.    tacrolimus (PROGRAF) 1 MG capsule Take 1-2 mg by mouth See admin instructions. Take 2 mg by mouth in the morning, then take 1 mg  by mouth in the evening    No facility-administered encounter medications on file as of 04/04/2022.    [name] was contacted to remind of upcoming telephone visit with Charlene Brooke on 04/07/22 at 1:30.  Patient was reminded to have any blood glucose and blood pressure readings available for review at appointment.   Message was left reminding patient of appointment.  CCM referral has been placed prior to visit?  Yes   Star Rating Drugs: Medication:  Last Fill: Day Supply Jardiance 25mg 01/17/22 90 Lantus   02/08/22 90 Losartan 50mg 03/21/22 90 Lovastatin 20mg 04/04/22 90 Metformin Xr 500 03/10/22 90  Lindsey Foltanski, CPP notified  Velmena Humble, CCMA Health concierge  [phone] 

## 2022-04-05 ENCOUNTER — Other Ambulatory Visit: Payer: Self-pay | Admitting: Family Medicine

## 2022-04-05 DIAGNOSIS — Z794 Long term (current) use of insulin: Secondary | ICD-10-CM

## 2022-04-05 DIAGNOSIS — N1831 Chronic kidney disease, stage 3a: Secondary | ICD-10-CM

## 2022-04-05 DIAGNOSIS — E1169 Type 2 diabetes mellitus with other specified complication: Secondary | ICD-10-CM

## 2022-04-07 ENCOUNTER — Telehealth: Payer: Medicare HMO

## 2022-04-07 NOTE — Progress Notes (Deleted)
Chronic Care Management Pharmacy Note  04/07/2022 Name:  Colton Porter MRN:  [id number] DOB:  11-05-1965  Summary: CCM F/U visit -A1c 8.1% (10/2021), improved from 9.6%; he has not started [facility] yet though he did receive the sensors, his wife wears [employer] 3 and can show him how to use it -Pt decided he does not want to start Ozempic right now, he wants to work on diet; he has upcoming CPE   Recommendations/Changes made from today's visit: -Advised pt to call if he has trouble with [employer] 2  Plan: -Pharmacist follow up televisit scheduled for 2 months -PCP f/u 07/01/22   Subjective: Colton Porter is an 56 y.o. year old male who is a primary patient of Ria Bush, MD.  The CCM team was consulted for assistance with disease management and care coordination needs.    Engaged with patient by telephone for follow up visit in response to provider referral for pharmacy case management and/or care coordination services.   Consent to Services:  The patient was given information about Chronic Care Management services, agreed to services, and gave verbal consent prior to initiation of services.  Please see initial visit note for detailed documentation.   Patient Care Team: Ria Bush, MD as PCP - General (Family Medicine) Freada Bergeron, MD as PCP - Cardiology (Cardiology) Charlton Haws, [facility] as Pharmacist (Pharmacist)  Recent office visits: 03/01/22 Dr [name] OV: annual - Trig elevated, will recheck [facility] next labwork. A1c 8.9%; consider change to more potent Tresiba (U-200) or Toujeo (U-300); advised restart FL2  10/27/21 Dr Darnell Level OV: f/u DM; A1c 8.1% improved. Trial Ozempic. 06/22/21 Dr [name] OV: f/u DM. A1c 8.6 > 9.3%; increased Jardiance to 25 mg. RTC 2 weeks for BMP on higher Jardiance dose 04/29/21 - PCP message - Back pain, trial robaxin PRN   Recent consult visits:  10/26/21 Dr Erlene Quan (Urology): f/u prostate cancer. S/p  radiation. Rec calcium/Vitamin D post ADT. Monitor PSA q6 months. 03/22/21 - Endocrinology - A1c improved, 8.6%. Follow up with PCP.   Hospital visits:  None in previous 6 months  Objective:  Lab Results  Component Value Date   CREATININE 1.20 02/22/2022   BUN 15 02/22/2022   GFR 68.11 02/22/2022   GFRNONAA >60 08/02/2019   GFRAA >60 08/02/2019   NA 141 02/22/2022   K 4.0 02/22/2022   CALCIUM 9.4 02/22/2022   CO2 26 02/22/2022   GLUCOSE 193 (H) 02/22/2022   Lab Results  Component Value Date/Time   HGBA1C 8.9 (H) 02/22/2022 08:16 AM   HGBA1C 8.1 (A) 10/27/2021 09:41 AM   HGBA1C 9.3 (A) 06/22/2021 09:12 AM   HGBA1C 11.5 (H) 02/08/2021 08:02 AM   FRUCTOSAMINE 436 (H) 04/22/2020 08:35 AM   FRUCTOSAMINE 232 08/02/2017 08:26 AM   GFR 68.11 02/22/2022 08:16 AM   GFR 68.61 02/08/2021 08:02 AM   MICROALBUR 0.8 02/22/2022 08:16 AM   MICROALBUR <0.7 08/27/2014 09:51 AM    Last diabetic Eye exam:  Lab Results  Component Value Date/Time   HMDIABEYEEXA No Retinopathy 11/10/2021 12:00 AM     Lab Results  Component Value Date   CHOL 102 02/22/2022   HDL 28.20 (L) 02/22/2022   LDLCALC 44 10/06/2017   LDLDIRECT 33.0 02/22/2022   TRIG (H) 02/22/2022    543.0 Triglyceride is over 400; calculations on Lipids are invalid.   CHOLHDL 4 02/22/2022       Latest Ref Rng & Units 02/22/2022    8:16 AM 04/02/2021  4:45 PM 02/08/2021    8:02 AM  Hepatic Function  Total Protein 6.0 - 8.3 g/dL 6.9  7.4  6.9   Albumin 3.5 - 5.2 g/dL 3.8   3.9   AST 0 - 37 U/L $Remo'16  16  14   'LUFla$ ALT 0 - 53 U/L $Remo'21  21  21   'JltzB$ Alk Phosphatase 39 - 117 U/L 91   102   Total Bilirubin 0.2 - 1.2 mg/dL 0.4  0.6  0.5     Lab Results  Component Value Date/Time   TSH 1.10 04/07/2020 08:39 AM   TSH 0.716 07/18/2019 11:33 AM   TSH 1.47 10/03/2016 08:17 AM   FREET4 0.91 11/01/2013 03:06 PM   FREET4 0.9 05/27/2009 10:50 AM       Latest Ref Rng & Units 02/22/2022    8:16 AM 05/26/2021    3:35 PM 04/02/2021    4:45 PM   CBC  WBC 4.0 - 10.5 K/uL 5.4  7.0  7.9   Hemoglobin 13.0 - 17.0 g/dL 12.0  13.1  13.8   Hematocrit 39.0 - 52.0 % 35.4  39.5  42.2   Platelets 150.0 - 400.0 K/uL 131.0  162  182     Lab Results  Component Value Date/Time   VD25OH 32.34 02/22/2022 08:16 AM   VD25OH 43.81 02/08/2021 08:02 AM    Clinical ASCVD: No  The ASCVD Risk score (Arnett DK, et al., 2019) failed to calculate for the following reasons:   The valid total cholesterol range is 130 to 320 mg/dL       03/01/2022    8:53 AM 10/27/2021   10:41 AM 02/15/2021    9:33 AM  Depression screen PHQ 2/9  Decreased Interest 0 0 0  Down, Depressed, Hopeless 0 0 0  PHQ - 2 Score 0 0 0  Altered sleeping $RemoveBeforeDE'1 3 2  'wUSzVHSnOcAjZnA$ Tired, decreased energy $RemoveBeforeDE'1 2 2  'mbTlSZDliUJqfoa$ Change in appetite 0 0 1  Feeling bad or failure about yourself  0 0 0  Trouble concentrating 0 0 0  Moving slowly or fidgety/restless 0 0 0  Suicidal thoughts 0 0 0  PHQ-9 Score $RemoveBef'2 5 5  'VTtXGuvIoh$ Difficult doing work/chores Not difficult at all Not difficult at all     Social History   Tobacco Use  Smoking Status Never  Smokeless Tobacco Former   Types: Chew   BP Readings from Last 3 Encounters:  03/01/22 120/72  11/18/21 139/89  10/27/21 110/70   Pulse Readings from Last 3 Encounters:  03/01/22 76  11/18/21 89  10/27/21 84   Wt Readings from Last 3 Encounters:  03/01/22 258 lb 4 oz (117.1 kg)  11/18/21 256 lb (116.1 kg)  10/27/21 256 lb 8 oz (116.3 kg)   BMI Readings from Last 3 Encounters:  03/01/22 41.06 kg/m  11/18/21 40.70 kg/m  10/27/21 41.40 kg/m    Assessment/Interventions: Review of patient past medical history, allergies, medications, health status, including review of consultants reports, laboratory and other test data, was performed as part of comprehensive evaluation and provision of chronic care management services.   SDOH:  (Social Determinants of Health) assessments and interventions performed: Yes SDOH Interventions    Flowsheet Row Chronic Care Management  from 12/03/2021 in [place] at [place] Management from 04/13/2021 in [place] at [place] Management from 03/02/2021 in [place] at [place] from 01/22/2020 in [place] [facility] [place] from 10/15/2018 in [place] at  [place]  SDOH Interventions       Food Insecurity Interventions Intervention Not Indicated -- -- -- --  Transportation Interventions Intervention Not Indicated -- -- -- --  Depression Interventions/Treatment  -- -- -- PHQ2-9 Score <4 Follow-up Not Indicated PHQ2-9 Score <4 Follow-up Not Indicated  Financial Strain Interventions -- Intervention Not Indicated Other (Comment)  [Lantus assistance program] -- --         SDOH Screenings   Food Insecurity: No Food Insecurity (12/03/2021)  Housing: Low Risk  (01/22/2020)  Transportation Needs: No Transportation Needs (12/03/2021)  Alcohol Screen: Low Risk  (01/22/2020)  Depression (PHQ2-9): Low Risk  (03/01/2022)  Financial Resource Strain: Low Risk  (04/13/2021)  Recent Concern: Financial Resource Strain - Medium Risk (03/02/2021)  Physical Activity: Inactive (01/22/2020)  Stress: No Stress Concern Present (01/22/2020)  Tobacco Use: Medium Risk (03/01/2022)    CCM Care Plan  Allergies  Allergen Reactions   Tolmetin Rash    cirrhosis   Ambien [Zolpidem Tartrate] Other (See Comments)    Over-toxicity from liver failure   Morphine And Related     Hallucinations.   Trulicity [Dulaglutide]     frequency   Hydrochlorothiazide W-Triamterene Other (See Comments)    REACTION: dizzy, nausea   Lisinopril Other (See Comments)    REACTION: cough, decreased libido    Medications Reviewed Today     Reviewed by Ria Bush, MD (Physician) on 03/01/22 at 0807  Med List Status: <None>   Medication Order Taking? Sig Documenting Provider Last Dose Status Informant  Accu-Chek FastClix Lancets MISC [id number] Yes USE THREE  TIMES DAILY TO CHECK SUGARS E65.11, INSULIN USE Ria Bush, MD Taking Active   ACCU-CHEK GUIDE test strip [id number] Yes USE AS INSTRUCTED TO CHECK SUGARS THREE TIMES DAILY E11.65, INSULIN USE Ria Bush, MD Taking Active   acetaminophen (TYLENOL) 500 MG tablet [id number] Yes Take 500 mg by mouth every 6 (six) hours as needed for mild pain. [provider] Taking Active Spouse/Significant Other  allopurinol (ZYLOPRIM) 300 MG tablet [id number] Yes TAKE 1 TABLET EVERY DAY Ria Bush, MD Taking Active   ASPIR-LOW 81 MG EC tablet [id number] Yes Take 1 tablet (81 mg total) by mouth daily. Ria Bush, MD Taking Active   B-D UF III MINI PEN NEEDLES 31G X 5 MM MISC [id number] Yes USE TO [place] Ria Bush, MD Taking Active   Blood Glucose Monitoring Suppl (ACCU-CHEK GUIDE) w/Device KIT [id number] Yes 1 Units by Does not apply route as directed. Ria Bush, MD Taking Active Spouse/Significant Other  Cholecalciferol (VITAMIN D3) 2000 units TABS [id number] Yes Take 1 tablet by mouth at bedtime. [provider] Taking Active Spouse/Significant Other  Continuous Blood Gluc Sensor (FREESTYLE LIBRE 2 SENSOR) MISC [id number] Yes Apply sensor every 14 days to monitor sugar continously Ria Bush, MD Taking Active            Med Note Malena Catholic Jan 12, 2022 11:56 AM) Pt received device - not started yet  docusate sodium (COLACE) 100 MG capsule [id number] Yes Take 100 mg by mouth daily as needed for mild constipation.  [provider] Taking Active Spouse/Significant Other  empagliflozin (JARDIANCE) 25 MG TABS tablet [id number] Yes Take 1 tablet (25 mg total) by mouth daily before breakfast. Ria Bush, MD Taking Active   escitalopram (LEXAPRO) 10 MG tablet [id number] Yes TAKE 1 TABLET EVERY DAY Ria Bush, MD Taking Active   fluticasone [facility]) 50 MCG/ACT nasal spray [id number] Yes Place 2 sprays  into both  nostrils daily. Ria Bush, MD Taking Active   furosemide (LASIX) 40 MG tablet [id number] Yes Take 1 tablet (40 mg total) by mouth daily. Freada Bergeron, MD Taking Active   glucose blood (ACCU-CHEK GUIDE) test strip [id number] Yes Check sugars three times daily and as needed when feeling ill E11.65, insulin use Ria Bush, MD Taking Active Spouse/Significant Other  insulin glargine (LANTUS SOLOSTAR) 100 UNIT/ML Solostar Pen [id number] Yes Inject 55 Units into the skin 2 (two) times daily. And pen needles 1/day Ria Bush, MD Taking Active   loratadine (CLARITIN) 10 MG tablet [id number] Yes Take 10 mg by mouth daily. [provider] Taking Active Spouse/Significant Other  losartan (COZAAR) 50 MG tablet [id number] Yes TAKE 1 TABLET EVERY DAY Ria Bush, MD Taking Active   lovastatin (MEVACOR) 20 MG tablet [id number] Yes TAKE 1 TABLET (20 MG TOTAL) BY MOUTH AT BEDTIME. Ria Bush, MD Taking Active   magnesium oxide (MAG-OX) 400 (240 Mg) MG tablet [id number] Yes TAKE 1 TABLET BY MOUTH TWICE A Velora Heckler, MD Taking Active   metFORMIN (GLUCOPHAGE-XR) 500 MG 24 hr tablet [id number] Yes Take 1 tablet (500 mg total) by mouth 2 (two) times daily. Ria Bush, MD Taking Active   methocarbamol (ROBAXIN) 500 MG tablet [id number] Yes Take 1 tablet (500 mg total) by mouth 3 (three) times daily as needed for muscle spasms (sedation precautions). Ria Bush, MD Taking Active   Multiple Vitamins-Minerals (CVS SPECTRAVITE PO) [id number] Yes Take 1 tablet by mouth at bedtime. [provider] Taking Active Spouse/Significant Other  mycophenolate (MYFORTIC) 360 MG TBEC EC tablet [id number] Yes Take 360 mg by mouth 2 (two) times daily. [provider] Taking Active   Omega-3 Fatty Acids (FISH OIL PO) [id number] Yes Take 1 tablet by mouth at bedtime. [provider] Taking Active Spouse/Significant Other  omeprazole (PRILOSEC) 40 MG  capsule [id number] Yes Take 1 capsule (40 mg total) by mouth daily. Ria Bush, MD Taking Active   potassium chloride (KLOR-CON M) 10 MEQ tablet [id number] Yes Take by mouth. [provider] Taking Active   tacrolimus (PROGRAF) 1 MG capsule [id number] Yes Take 1-2 mg by mouth See admin instructions. Take 2 mg by mouth in the morning, then take 1 mg  by mouth in the evening [provider] Taking Active Spouse/Significant Other            Patient Active Problem List   Diagnosis Date Noted   Sinus congestion 06/22/2021   Medicare annual wellness visit, subsequent 02/15/2021   Immunosuppressed status ([place]) 11/10/2020   Chronic right-sided thoracic back pain 04/07/2020   Memory loss 04/07/2020   Prostate cancer ([place]) 02/06/2020   Advanced directives, counseling/discussion 02/06/2020   Pain, dental 08/21/2019   Orthostatic hypotension 07/31/2019   Syncope 07/30/2019   [place] lesion, left 02/05/2018   Acute sinusitis 10/23/2017   Chronic heel pain, left 10/14/2017   Anejaculation 08/09/2017   Chronic gout 05/22/2017   Dyspnea 11/18/2016   BPH (benign prostatic hyperplasia) 10/05/2016   Status post liver transplant ([place]) 03/15/2016   Portal hypertensive gastropathy ([place]) 12/04/2015   Type 2 diabetes mellitus with other specified complication ([place]) 35/36/1443   Anemia    Gynecomastia    CKD (chronic kidney disease) stage 3, GFR 30-59 ml/min ([place]) 11/04/2015   Depression, major, single episode, moderate ([place]) 07/09/2015   Headache 02/19/2015   Insomnia 12/06/2014   Hypomagnesemia    Other fatigue 11/18/2014   Internal hemorrhoid 10/01/2014   Erectile dysfunction 10/01/2014  Alcoholic cirrhosis of liver without ascites ([place]) 07/28/2014   (HFpEF) heart failure with preserved ejection fraction ([place]) 11/01/2013   Essential hypertension 06/04/2013   OSA on CPAP 07/11/2012   History of alcohol dependence ([place]) 07/11/2012   Thrombocytopenia ([place]) 12/15/2011    Morbid obesity with BMI of 40.0-44.9, adult ([place]) 11/16/2011   Routine general medical examination at a health care facility 09/22/2010   NEUROPATHY 05/27/2009   Dyslipidemia associated with type 2 diabetes mellitus ([place]) 02/15/2007   Allergic rhinitis 02/15/2007   GERD 02/15/2007    Immunization History  Administered Date(s) Administered   Hep A / Hep B 01/16/2015, 01/23/2015, 02/06/2015   Hepatitis B, adult 12/04/2015   Influenza Whole 03/24/2010   Influenza,inj,Quad PF,6+ Mos 04/22/2015, 04/06/2017, 04/23/2018, 04/23/2019, 04/07/2020, 06/22/2021, 03/01/2022   PFIZER(Purple Top)SARS-COV-2 Vaccination 11/07/2019, 11/28/2019, 05/07/2020, 12/04/2020   PNEUMOCOCCAL CONJUGATE-20 02/15/2021   Pneumococcal Polysaccharide-23 04/22/2015   Td 05/01/2007   Tdap 12/14/2015, 10/05/2020    Conditions to be addressed/monitored:  Hypertension, Hyperlipidemia, and Diabetes  There are no care plans that you recently modified to display for this patient.   Medication Assistance:  Lantus ([employer] PAP) Jardiance (PAN foundation) - approved through 07/25/22  Member ID: [id number] Group ID: [id number] RX BIN: [id number] PCN: [id number]  Compliance/Adherence/Medication fill history: Care Gaps: Eye exam due   Star rating drugs: Metformin XR $RemoveBefo'500mg'ASinFhiWZhG$  - PDC 100% Losartan $RemoveBefor'50mg'UUyvOMhitrTZ$  - PDC 100% Lovastatin $RemoveBeforeD'20mg'zPkCnhrYDxQoQm$  - PDC 100% Jardiance - PDC 100%  Medication Access: Within the past 30 days, how often has patient missed a dose of medication? 0 Is a pillbox or other method used to improve adherence? Yes  Factors that may affect medication adherence? financial need Are meds synced by current pharmacy? No  Are meds delivered by current pharmacy? Yes  Does patient experience delays in picking up medications due to transportation concerns? No   [facility] Reviewed: Is patient disadvantaged to use [facility]?: Yes  Current Rx insurance plan: [employer] [place] Name and location of Current pharmacy:  [id number] [place], [place] - [address] [place] [address] Phone: [phone] Fax: [phone]  [facility] [place], [place] [address] Phone: [phone] Fax: [phone]  [facility] services reviewed with patient today?: No  Patient requests to transfer care to [facility]?: No  Reason patient declined to change pharmacies: Disadvantaged due to insurance/mail order   Care Plan and Follow Up Patient Decision:  Patient agrees to Care Plan and Follow-up.  Follow Up Plan: Telephone follow up appointment with care management team member scheduled for: 2 month  Charlene Brooke, PharmD, BCACP Clinical Pharmacist [facility] [phone]   Current Barriers:  Unable to achieve control of diabetes  Pharmacist Clinical Goal(s):  Patient will contact provider office for questions/concerns as evidenced notation of same in electronic health record through collaboration with PharmD and provider.   Interventions: 1:1 collaboration with Ria Bush, MD regarding development and update of comprehensive plan of care as evidenced by provider attestation and co-signature Inter-disciplinary care team collaboration (see longitudinal plan of care) Comprehensive medication review performed; medication list updated in electronic medical record  Hypertension / HFpEF (BP goal <140/90) -Controlled - BP at goal; Denies hypotensive/hypertensive symptoms -Last ejection fraction: 65-70% (Date: 07/2019) -HF type: Diastolic;  NYHA Class: II (slight limitation of activity) -Current treatment: Losartan 50 mg daily - Appropriate, Effective, Safe, Accessible Furosemide  40 mg daily - Appropriate, Effective, Safe, Accessible -Educated on BP goals and benefits of medications for prevention of heart attack, stroke and kidney  damage; -Counseled to monitor BP at home periodically -Recommended to continue current medication   Hyperlipidemia: (LDL goal < 100) -Controlled - LDL 38 (01/2021); but TG elevated above goal secondary to DM. -Current treatment: Lovastatin 20 mg daily HS - Appropriate, Effective, Safe, Accessible -Medications previously tried: none  -Recommended to continue current medication  Diabetes (A1c goal <7%) -Uncontrolled - A1c 8.9% (01/2022) improved from 9.6%;  he received Freestyle Libre 2 but has not put it on yet (his wife uses FL3 and can show him how to use it); pt decided he does not want to start Ozempic and will work on diet to improve DM -Current home glucose readings: 190-214, checking AM and post-prandial -Current medications: Jardiance 25 mg daily (PAP) - Appropriate, Effective, Safe, Accessible Lantus Solostar 55 units BID-Appropriate, Query Effective Metformin XR 500 mg BID -Appropriate, Effective, Safe, Accessible Freestyle Libre 2 (total medical) -Medications previously tried: Trulicity (diarrhea, cost), Ozempic (never started, declined PAP) -Pt is taking 0.94 u/kg/day of basal insulin, would consider this over-basalization and further increasing the dose will probably not help with BG; at this point either GLP-1 agonist or prandial insulin is recommended next -Reviewed benefits of Ozempic and CGM; pt voiced he will start using [place] and wants to work on diet -Recommend to continue current medication  Health Maintenance -Vaccine gaps: Covid booster, Shingrix -Prostate cancer Dx 02/2021, completed radiation, now cancer free. Follows with urology. 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 Follows w [facility] ([place], Dr Zollie Scale)  Patient Goals/Self-Care Activities Patient will:  - take medications as prescribed as evidenced by patient report and record review focus on medication adherence by routine check glucose twice daily,  document, and provide at future appointments engage in dietary modifications by limiting carbs, sugar

## 2022-04-08 ENCOUNTER — Other Ambulatory Visit: Payer: Medicare HMO

## 2022-04-08 ENCOUNTER — Ambulatory Visit: Payer: Medicare HMO | Admitting: Pharmacist

## 2022-04-08 ENCOUNTER — Telehealth: Payer: Self-pay | Admitting: Pharmacist

## 2022-04-08 DIAGNOSIS — I10 Essential (primary) hypertension: Secondary | ICD-10-CM

## 2022-04-08 DIAGNOSIS — E1169 Type 2 diabetes mellitus with other specified complication: Secondary | ICD-10-CM

## 2022-04-08 NOTE — Progress Notes (Unsigned)
Chronic Care Management Pharmacy Note  04/11/2022 Name:  Colton Porter MRN:  [id number] DOB:  1965-09-30  Summary: CCM F/U visit -A1c 8.1% (10/2021), improved from 9.6%; pt has discussed changing Lantus to more concentrated insulin Evaristo Bury U200 or Toujeu U300), he will wait until his current supply of Lantus runs out; of note he is currently on 0.95 un/kg of basal insulin and further increasing the dose is unlikely to yield much benefit  -Reviewed AGP report: 03/26/22 to 04/08/22. Sensor active: 76%  Time in range (70-180): 31% (goal > 70%)  High (>180): 69%  Low (< 70): 0% (goal < 4%)  GMI: 8.5%; Average glucose: 217  Recommendations/Changes made from today's visit: -Recommend to increase metformin 500 mg to 1 AM, 2 PM with goal to eventually increase to 2000 mg/day - see phone note -Once Lantus runs out change to [place] U200 - 110 units once daily  Plan: -Pharmacist follow up televisit scheduled for 2 months -PCP F/U 07/01/22   Subjective: Colton Porter is an 56 y.o. year old male who is a primary patient of Eustaquio Boyden, MD.  The CCM team was consulted for assistance with disease management and care coordination needs.    Engaged with patient by telephone for follow up visit in response to provider referral for pharmacy case management and/or care coordination services.   Consent to Services:  The patient was given information about Chronic Care Management services, agreed to services, and gave verbal consent prior to initiation of services.  Please see initial visit note for detailed documentation.   Patient Care Team: Eustaquio Boyden, MD as PCP - General (Family Medicine) Meriam Sprague, MD as PCP - Cardiology (Cardiology) Kathyrn Sheriff, [facility] as Pharmacist (Pharmacist)  Recent office visits: 03/01/22 Dr Sharen Hones OV: annual - Trig elevated, will recheck FLP next labwork. A1c 8.9%; consider change to more potent Tresiba (U-200) or Toujeo (U-300);  advised restart FL2  10/27/21 Dr Reece Agar OV: f/u DM; A1c 8.1% improved. Trial Ozempic. 06/22/21 Dr Sharen Hones OV: f/u DM. A1c 8.6 > 9.3%; increased Jardiance to 25 mg. RTC 2 weeks for BMP on higher Jardiance dose 04/29/21 - PCP message - Back pain, trial robaxin PRN   Recent consult visits:  10/26/21 Dr Apolinar Junes (Urology): f/u prostate cancer. S/p radiation. Rec calcium/Vitamin D post ADT. Monitor PSA q6 months. 03/22/21 - Endocrinology - A1c improved, 8.6%. Follow up with PCP.   Hospital visits:  None in previous 6 months  Objective:  Lab Results  Component Value Date   CREATININE 1.20 02/22/2022   BUN 15 02/22/2022   GFR 68.11 02/22/2022   GFRNONAA >60 08/02/2019   GFRAA >60 08/02/2019   NA 141 02/22/2022   K 4.0 02/22/2022   CALCIUM 9.4 02/22/2022   CO2 26 02/22/2022   GLUCOSE 193 (H) 02/22/2022   Lab Results  Component Value Date/Time   HGBA1C 8.9 (H) 02/22/2022 08:16 AM   HGBA1C 8.1 (A) 10/27/2021 09:41 AM   HGBA1C 9.3 (A) 06/22/2021 09:12 AM   HGBA1C 11.5 (H) 02/08/2021 08:02 AM   FRUCTOSAMINE 436 (H) 04/22/2020 08:35 AM   FRUCTOSAMINE 232 08/02/2017 08:26 AM   GFR 68.11 02/22/2022 08:16 AM   GFR 68.61 02/08/2021 08:02 AM   MICROALBUR 0.8 02/22/2022 08:16 AM   MICROALBUR <0.7 08/27/2014 09:51 AM    Last diabetic Eye exam:  Lab Results  Component Value Date/Time   HMDIABEYEEXA No Retinopathy 11/10/2021 12:00 AM     Lab Results  Component Value Date   CHOL  102 02/22/2022   HDL 28.20 (L) 02/22/2022   LDLCALC 44 10/06/2017   LDLDIRECT 33.0 02/22/2022   TRIG (H) 02/22/2022    543.0 Triglyceride is over 400; calculations on Lipids are invalid.   CHOLHDL 4 02/22/2022       Latest Ref Rng & Units 02/22/2022    8:16 AM 04/02/2021    4:45 PM 02/08/2021    8:02 AM  Hepatic Function  Total Protein 6.0 - 8.3 g/dL 6.9  7.4  6.9   Albumin 3.5 - 5.2 g/dL 3.8   3.9   AST 0 - 37 U/L $Remo'16  16  14   'osfqm$ ALT 0 - 53 U/L $Remo'21  21  21   'nsIyK$ Alk Phosphatase 39 - 117 U/L 91   102   Total  Bilirubin 0.2 - 1.2 mg/dL 0.4  0.6  0.5     Lab Results  Component Value Date/Time   TSH 1.10 04/07/2020 08:39 AM   TSH 0.716 07/18/2019 11:33 AM   TSH 1.47 10/03/2016 08:17 AM   FREET4 0.91 11/01/2013 03:06 PM   FREET4 0.9 05/27/2009 10:50 AM       Latest Ref Rng & Units 02/22/2022    8:16 AM 05/26/2021    3:35 PM 04/02/2021    4:45 PM  CBC  WBC 4.0 - 10.5 K/uL 5.4  7.0  7.9   Hemoglobin 13.0 - 17.0 g/dL 12.0  13.1  13.8   Hematocrit 39.0 - 52.0 % 35.4  39.5  42.2   Platelets 150.0 - 400.0 K/uL 131.0  162  182     Lab Results  Component Value Date/Time   VD25OH 32.34 02/22/2022 08:16 AM   VD25OH 43.81 02/08/2021 08:02 AM    Clinical ASCVD: No  The ASCVD Risk score (Arnett DK, et al., 2019) failed to calculate for the following reasons:   The valid total cholesterol range is 130 to 320 mg/dL       03/01/2022    8:53 AM 10/27/2021   10:41 AM 02/15/2021    9:33 AM  Depression screen PHQ 2/9  Decreased Interest 0 0 0  Down, Depressed, Hopeless 0 0 0  PHQ - 2 Score 0 0 0  Altered sleeping $RemoveBeforeDE'1 3 2  'KlhSkncPpKJgMou$ Tired, decreased energy $RemoveBeforeDE'1 2 2  'WeNWQjdBUfBuYjT$ Change in appetite 0 0 1  Feeling bad or failure about yourself  0 0 0  Trouble concentrating 0 0 0  Moving slowly or fidgety/restless 0 0 0  Suicidal thoughts 0 0 0  PHQ-9 Score $RemoveBef'2 5 5  'tALRwcvLUm$ Difficult doing work/chores Not difficult at all Not difficult at all     Social History   Tobacco Use  Smoking Status Never  Smokeless Tobacco Former   Types: Chew   BP Readings from Last 3 Encounters:  03/01/22 120/72  11/18/21 139/89  10/27/21 110/70   Pulse Readings from Last 3 Encounters:  03/01/22 76  11/18/21 89  10/27/21 84   Wt Readings from Last 3 Encounters:  03/01/22 258 lb 4 oz (117.1 kg)  11/18/21 256 lb (116.1 kg)  10/27/21 256 lb 8 oz (116.3 kg)   BMI Readings from Last 3 Encounters:  03/01/22 41.06 kg/m  11/18/21 40.70 kg/m  10/27/21 41.40 kg/m    Assessment/Interventions: Review of patient past medical history, allergies,  medications, health status, including review of consultants reports, laboratory and other test data, was performed as part of comprehensive evaluation and provision of chronic care management services.   SDOH:  (Social Determinants of Health) assessments  and interventions performed: Yes SDOH Interventions    Flowsheet Row Chronic Care Management from 12/03/2021 in [place] at [place] Management from 04/13/2021 in [place] at [place] Management from 03/02/2021 in [place] at [place] from 01/22/2020 in [place] at [place] from 10/15/2018 in [place] at [place] Interventions Intervention Not Indicated -- -- -- --  Transportation Interventions Intervention Not Indicated -- -- -- --  Depression Interventions/Treatment  -- -- -- ZOX0-9 Score <4 Follow-up Not Indicated PHQ2-9 Score <4 Follow-up Not Indicated  Financial Strain Interventions -- Intervention Not Indicated Other (Comment)  [Lantus assistance program] -- --         SDOH Screenings   Food Insecurity: No Food Insecurity (12/03/2021)  Housing: Low Risk  (01/22/2020)  Transportation Needs: No Transportation Needs (12/03/2021)  Alcohol Screen: Low Risk  (01/22/2020)  Depression (PHQ2-9): Low Risk  (03/01/2022)  Financial Resource Strain: Low Risk  (04/13/2021)  Recent Concern: Financial Resource Strain - Medium Risk (03/02/2021)  Physical Activity: Inactive (01/22/2020)  Stress: No Stress Concern Present (01/22/2020)  Tobacco Use: Medium Risk (03/01/2022)    CCM Care Plan  Allergies  Allergen Reactions   Tolmetin Rash    cirrhosis   Ambien [Zolpidem Tartrate] Other (See Comments)    Over-toxicity from liver failure   Morphine And Related     Hallucinations.   Trulicity [Dulaglutide]     frequency   Hydrochlorothiazide W-Triamterene Other (See Comments)    REACTION:  dizzy, nausea   Lisinopril Other (See Comments)    REACTION: cough, decreased libido    Medications Reviewed Today     Reviewed by Ria Bush, MD (Physician) on 03/01/22 at 0807  Med List Status: <None>   Medication Order Taking? Sig Documenting Provider Last Dose Status Informant  Accu-Chek FastClix Lancets MISC [id number] Yes USE THREE TIMES DAILY TO CHECK SUGARS E65.11, INSULIN USE Ria Bush, MD Taking Active   ACCU-CHEK GUIDE test strip [id number] Yes USE AS INSTRUCTED TO CHECK SUGARS THREE TIMES DAILY E11.65, INSULIN USE Ria Bush, MD Taking Active   acetaminophen (TYLENOL) 500 MG tablet [id number] Yes Take 500 mg by mouth every 6 (six) hours as needed for mild pain. [provider] Taking Active Spouse/Significant Other  allopurinol (ZYLOPRIM) 300 MG tablet [id number] Yes TAKE 1 TABLET EVERY DAY Ria Bush, MD Taking Active   ASPIR-LOW 81 MG EC tablet [id number] Yes Take 1 tablet (81 mg total) by mouth daily. Ria Bush, MD Taking Active   B-D UF III MINI PEN NEEDLES 31G X 5 MM MISC [id number] Yes USE TO [place] Ria Bush, MD Taking Active   Blood Glucose Monitoring Suppl (ACCU-CHEK GUIDE) w/Device KIT [id number] Yes 1 Units by Does not apply route as directed. Ria Bush, MD Taking Active Spouse/Significant Other  Cholecalciferol (VITAMIN D3) 2000 units TABS [id number] Yes Take 1 tablet by mouth at bedtime. [provider] Taking Active Spouse/Significant Other  Continuous Blood Gluc Sensor (FREESTYLE LIBRE 2 SENSOR) MISC [id number] Yes Apply sensor every 14 days to monitor sugar continously Ria Bush, MD Taking Active            Med Note Malena Catholic Jan 12, 2022 11:56 AM) Pt received device - not started yet  docusate sodium (COLACE) 100 MG capsule [id number] Yes Take 100 mg by mouth daily as needed for mild constipation.  [provider] Taking Active Spouse/Significant Other   empagliflozin (JARDIANCE) 25 MG TABS tablet [id number] Yes Take 1 tablet (25 mg total) by mouth daily before breakfast. Ria Bush, MD Taking Active   escitalopram (LEXAPRO) 10 MG tablet [id number] Yes TAKE 1 TABLET EVERY DAY Ria Bush, MD Taking Active   fluticasone [facility]) 50 MCG/ACT nasal spray [id number] Yes Place 2 sprays into both nostrils daily. Ria Bush, MD Taking Active   furosemide (LASIX) 40 MG tablet [id number] Yes Take 1 tablet (40 mg total) by mouth daily. Freada Bergeron, MD Taking Active   glucose blood (ACCU-CHEK GUIDE) test strip [id number] Yes Check sugars three times daily and as needed when feeling ill E11.65, insulin use Ria Bush, MD Taking Active Spouse/Significant Other  insulin glargine (LANTUS SOLOSTAR) 100 UNIT/ML Solostar Pen [id number] Yes Inject 55 Units into the skin 2 (two) times daily. And pen needles 1/day Ria Bush, MD Taking Active   loratadine (CLARITIN) 10 MG tablet [id number] Yes Take 10 mg by mouth daily. [provider] Taking Active Spouse/Significant Other  losartan (COZAAR) 50 MG tablet [id number] Yes TAKE 1 TABLET EVERY DAY Ria Bush, MD Taking Active   lovastatin (MEVACOR) 20 MG tablet [id number] Yes TAKE 1 TABLET (20 MG TOTAL) BY MOUTH AT BEDTIME. Ria Bush, MD Taking Active   magnesium oxide (MAG-OX) 400 (240 Mg) MG tablet [id number] Yes TAKE 1 TABLET BY MOUTH TWICE A Velora Heckler, MD Taking Active   metFORMIN (GLUCOPHAGE-XR) 500 MG 24 hr tablet [id number] Yes Take 1 tablet (500 mg total) by mouth 2 (two) times daily. Ria Bush, MD Taking Active   methocarbamol (ROBAXIN) 500 MG tablet [id number] Yes Take 1 tablet (500 mg total) by mouth 3 (three) times daily as needed for muscle spasms (sedation precautions). Ria Bush, MD Taking Active   Multiple Vitamins-Minerals ([facility] SPECTRAVITE PO) [id number] Yes Take 1 tablet by mouth at bedtime. [provider] Taking  Active Spouse/Significant Other  mycophenolate (MYFORTIC) 360 MG TBEC EC tablet [id number] Yes Take 360 mg by mouth 2 (two) times daily. [provider] Taking Active   Omega-3 Fatty Acids (FISH OIL PO) [id number] Yes Take 1 tablet by mouth at bedtime. [provider] Taking Active Spouse/Significant Other  omeprazole (PRILOSEC) 40 MG capsule [id number] Yes Take 1 capsule (40 mg total) by mouth daily. Ria Bush, MD Taking Active   potassium chloride (KLOR-CON M) 10 MEQ tablet [id number] Yes Take by mouth. [provider] Taking Active   tacrolimus (PROGRAF) 1 MG capsule [id number] Yes Take 1-2 mg by mouth See admin instructions. Take 2 mg by mouth in the morning, then take 1 mg  by mouth in the evening [provider] Taking Active Spouse/Significant Other            Patient Active Problem List   Diagnosis Date Noted   Sinus congestion 06/22/2021   Medicare annual wellness visit, subsequent 02/15/2021   Immunosuppressed status ([place]) 11/10/2020   Chronic right-sided thoracic back pain 04/07/2020   Memory loss 04/07/2020   Prostate cancer ([place]) 02/06/2020   Advanced directives, counseling/discussion 02/06/2020   Pain, dental 08/21/2019   Orthostatic hypotension 07/31/2019   Syncope 07/30/2019   [place] lesion, left 02/05/2018   Acute sinusitis 10/23/2017   Chronic heel pain, left 10/14/2017   Anejaculation 08/09/2017   Chronic gout 05/22/2017   Dyspnea 11/18/2016   BPH (benign prostatic hyperplasia) 10/05/2016   Status post liver transplant ([place]) 03/15/2016   Portal hypertensive gastropathy ([place]) 12/04/2015   Type 2 diabetes mellitus with  other specified complication ([place]) 62/56/3893   Anemia    Gynecomastia    CKD (chronic kidney disease) stage 3, GFR 30-59 ml/min (HCC) 11/04/2015   Depression, major, single episode, moderate ([place]) 07/09/2015   Headache 02/19/2015   Insomnia 12/06/2014   Hypomagnesemia    Other fatigue 11/18/2014    Internal hemorrhoid 10/01/2014   Erectile dysfunction 73/42/8768   Alcoholic cirrhosis of liver without ascites ([place]) 07/28/2014   (HFpEF) heart failure with preserved ejection fraction ([place]) 11/01/2013   Essential hypertension 06/04/2013   OSA on CPAP 07/11/2012   History of alcohol dependence ([place]) 07/11/2012   Thrombocytopenia ([place]) 12/15/2011   Morbid obesity with BMI of 40.0-44.9, adult ([place]) 11/16/2011   Routine general medical examination at a health care facility 09/22/2010   NEUROPATHY 05/27/2009   Dyslipidemia associated with type 2 diabetes mellitus ([place]) 02/15/2007   Allergic rhinitis 02/15/2007   GERD 02/15/2007    Immunization History  Administered Date(s) Administered   Hep A / Hep B 01/16/2015, 01/23/2015, 02/06/2015   Hepatitis B, adult 12/04/2015   Influenza Whole 03/24/2010   Influenza,inj,Quad PF,6+ Mos 04/22/2015, 04/06/2017, 04/23/2018, 04/23/2019, 04/07/2020, 06/22/2021, 03/01/2022   PFIZER(Purple Top)SARS-COV-2 Vaccination 11/07/2019, 11/28/2019, 05/07/2020, 12/04/2020   PNEUMOCOCCAL CONJUGATE-20 02/15/2021   Pneumococcal Polysaccharide-23 04/22/2015   Td 05/01/2007   Tdap 12/14/2015, 10/05/2020    Conditions to be addressed/monitored:  Hypertension, Hyperlipidemia, and Diabetes  Care Plan : [place]  Updates made by Charlton Haws, [place] since 04/11/2022 12:00 AM     Problem: Hypertension, Hyperlipidemia, and Diabetes   Priority: High     Long-Range Goal: Disease Management   Start Date: 03/02/2021  Expected End Date: 07/30/2022  This Visit's Progress: Not on track  Recent Progress: Not on track  Priority: High  Note:   Current Barriers:  Unable to achieve control of diabetes  Pharmacist Clinical Goal(s):  Patient will contact provider office for questions/concerns as evidenced notation of same in electronic health record through collaboration with PharmD and provider.   Interventions: 1:1 collaboration with Ria Bush, MD regarding development and update of comprehensive plan of care as evidenced by provider attestation and co-signature Inter-disciplinary care team collaboration (see longitudinal plan of care) Comprehensive medication review performed; medication list updated in electronic medical record  Hypertension / HFpEF (BP goal <140/90) -Controlled - BP at goal; Denies hypotensive/hypertensive symptoms -Last ejection fraction: 65-70% (Date: 07/2019) -HF type: Diastolic;  NYHA Class: II (slight limitation of activity) -Current treatment: Losartan 50 mg daily - Appropriate, Effective, Safe, Accessible Furosemide 40 mg daily - Appropriate, Effective, Safe, Accessible -Educated on BP goals and benefits of medications for prevention of heart attack, stroke and kidney damage; -Counseled to monitor BP at home periodically -Recommended to continue current medication   Hyperlipidemia: (LDL goal < 100) -Controlled - LDL 38 (01/2021); but TG elevated above goal secondary to DM. -Current treatment: Lovastatin 20 mg daily HS - Appropriate, Effective, Safe, Accessible -Medications previously tried: none  -Recommended to continue current medication  Diabetes (A1c goal <7%) -Uncontrolled - A1c 8.9% (01/2022) improved from 9.6%; he has been wearing Freestyle Libre 2 for about a month now -Current home glucose readings: -Reviewed AGP report: 03/26/22 to 04/08/22. Sensor active: 76%  Time in range (70-180): 31% (goal > 70%)  High (>180): 69%  Low (< 70): 0% (goal < 4%)  GMI: 8.5%; Average glucose: 217  -Current medications: Jardiance 25 mg daily (PAP) - Appropriate, Query Effective Lantus Solostar 55 units BID-Appropriate, Query Effective Metformin XR 500  mg BID -Appropriate, Query Effective Freestyle Libre 2 (total medical) -Medications previously tried: Trulicity (diarrhea, cost), Ozempic (never started, declined PAP), glimepiride (2018-2022) -Pt is taking 0.94 u/kg/day of basal insulin, would  consider this over-basalization and further increasing the dose will probably not help with BG; there may be small benefit from switching to a more concentrated insulin [place] A[place] U200 or Toujeo U300) but unlikely to achieve DM goals with this alone. Pt is ok with switching insulin once his current supply of Lantus runs out (about another 4 weeks).  -Recommend to increase metformin 500 mg to 1 AM, 2 PM with goal to eventually increase to 2000 mg/day -Once Lantus runs out change to [place] U200 - 110 units once daily  Health Maintenance -Vaccine gaps: Covid booster, Shingrix -Prostate cancer Dx 02/2021, completed radiation, now cancer free. Follows with urology. 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 Follows w [facility] ([facility] [place], Dr Zollie Scale)  Patient Goals/Self-Care Activities Patient will:  - take medications as prescribed as evidenced by patient report and record review focus on medication adherence by routine check glucose twice daily, document, and provide at future appointments engage in dietary modifications by limiting carbs, sugar     Medication Assistance:  Lantus ([employer] PAP) Jardiance (PAN foundation) - approved through 07/25/22  Member ID: [id number] Group ID: [id number] BIN: [id number] PCN: [id number]  Compliance/Adherence/Medication fill history: Care Gaps: Eye exam due   Star rating drugs: Metformin XR $RemoveBefo'500mg'kJDekLVEmXR$  - PDC 100% Losartan $RemoveBefor'50mg'wkmUSlJyzsMF$  - PDC 100% Lovastatin $RemoveBeforeD'20mg'nteGBHTlzsxxck$  - PDC 100% Jardiance - PDC 100%  Medication Access: Within the past 30 days, how often has patient missed a dose of medication? 0 Is a pillbox or other method used to improve adherence? Yes  Factors that may affect medication adherence? financial need Are meds synced by current pharmacy? No  Are meds delivered by current pharmacy? Yes  Does patient experience delays in picking up medications due to transportation concerns? No   [facility] Services Reviewed: Is patient  disadvantaged to use [facility]?: Yes  Current Rx insurance plan: [employer] [place] Name and location of Current pharmacy:  [id number] [place], [place] - [address] [place] [address] Phone: [phone] Fax: [phone]  [place] [address] [place], [place] [address] Phone: [phone] Fax: [phone]  [facility] services reviewed with patient today?: No  Patient requests to transfer care to [facility]?: No  Reason patient declined to change pharmacies: Disadvantaged due to insurance/mail order   Care Plan and Follow Up Patient Decision:  Patient agrees to Care Plan and Follow-up.  Follow Up Plan: Telephone follow up appointment with care management team member scheduled for: 1 month  Charlene Brooke, PharmD, BCACP Clinical Pharmacist [facility] at [facility] [phone]

## 2022-04-08 NOTE — Telephone Encounter (Signed)
Per [employer] report (see below), sugar is elevated throughout most of the day and night. Pt typically eats 1 meal per day at night and has not had much success adjusting diet.  Current medications: Jardiance 25 mg daily (PAP) Lantus Solostar 55 units BID Metformin XR 500 mg BID  Pt is taking 0.94 u/kg/day of basal insulin, would consider this over-basalization and further increasing the dose will probably not help with BG; there may be small benefit from switching to a more concentrated insulin [place] Aas U200 or Toujeo U300) but unlikely to achieve DM goals with this alone. Pt is ok with switching insulin once his current supply of Lantus runs out (about another 4 weeks).   He had GI side effects with Trulicity and declined trial of Ozempic.  Recommend to increase metformin 500 mg to 1 AM, 2 PM with goal to eventually increase to 2000 mg/day  Reviewed AGP report: 03/26/22 to 04/08/22. Sensor active: 76%  Time in range (70-180): 31% (goal > 70%)  High (>180): 69%  Low (< 70): 0% (goal < 4%)  GMI: 8.5%; Average glucose: 217

## 2022-04-09 ENCOUNTER — Other Ambulatory Visit: Payer: Self-pay | Admitting: Family Medicine

## 2022-04-09 DIAGNOSIS — E1169 Type 2 diabetes mellitus with other specified complication: Secondary | ICD-10-CM

## 2022-04-11 MED ORDER — METFORMIN HCL ER 500 MG PO TB24: ORAL_TABLET | ORAL | 0 refills | Status: DC

## 2022-04-11 NOTE — Telephone Encounter (Signed)
Agree with plan. Caution with increased metformin dose given prior intolerance due to diarrhea when on high dose metformin (I think it was IR form).

## 2022-04-12 NOTE — Patient Instructions (Addendum)
Visit Information  Phone number for Pharmacist: [phone]   Goals Addressed   None     Patient Care Plan: CCM Pharmacy Care Plan     Problem Identified: Hypertension, Hyperlipidemia, and Diabetes   Priority: High     Long-Range Goal: Disease Management   Start Date: 03/02/2021  Expected End Date: 07/30/2022  This Visit's Progress: Not on track  Recent Progress: Not on track  Priority: High  Note:   Current Barriers:  Unable to achieve control of diabetes  Pharmacist Clinical Goal(s):  Patient will contact provider office for questions/concerns as evidenced notation of same in electronic health record through collaboration with PharmD and provider.   Interventions: 1:1 collaboration with Ria Bush, MD regarding development and update of comprehensive plan of care as evidenced by provider attestation and co-signature Inter-disciplinary care team collaboration (see longitudinal plan of care) Comprehensive medication review performed; medication list updated in electronic medical record  Hypertension / HFpEF (BP goal <140/90) -Controlled - BP at goal; Denies hypotensive/hypertensive symptoms -Last ejection fraction: 65-70% (Date: 07/2019) -HF type: Diastolic;  NYHA Class: II (slight limitation of activity) -Current treatment: Losartan 50 mg daily - Appropriate, Effective, Safe, Accessible Furosemide 40 mg daily - Appropriate, Effective, Safe, Accessible -Educated on BP goals and benefits of medications for prevention of heart attack, stroke and kidney damage; -Counseled to monitor BP at home periodically -Recommended to continue current medication   Hyperlipidemia: (LDL goal < 100) -Controlled - LDL 38 (01/2021); but TG elevated above goal secondary to DM. -Current treatment: Lovastatin 20 mg daily HS - Appropriate, Effective, Safe, Accessible -Medications previously tried: none  -Recommended to continue current medication  Diabetes (A1c goal <7%) -Uncontrolled -  A1c 8.9% (01/2022) improved from 9.6%; he has been wearing Freestyle Libre 2 for about a month now -Current home glucose readings: -Reviewed AGP report: 03/26/22 to 04/08/22. Sensor active: 76%  Time in range (70-180): 31% (goal > 70%)  High (>180): 69%  Low (< 70): 0% (goal < 4%)  GMI: 8.5%; Average glucose: 217  -Current medications: Jardiance 25 mg daily (PAP) - Appropriate, Query Effective Lantus Solostar 55 units BID-Appropriate, Query Effective Metformin XR 500 mg BID -Appropriate, Query Effective Freestyle Libre 2 (total medical) -Medications previously tried: Trulicity (diarrhea, cost), Ozempic (never started, declined PAP), glimepiride (2018-2022) -Pt is taking 0.94 u/kg/day of basal insulin, would consider this over-basalization and further increasing the dose will probably not help with BG; there may be small benefit from switching to a more concentrated insulin [place] Aas U200 or [place] U300) but unlikely to achieve DM goals with this alone. Pt is ok with switching insulin once his current supply of Lantus runs out (about another 4 weeks).  -Recommend to increase metformin 500 mg to 1 AM, 2 PM with goal to eventually increase to 2000 mg/day -Once Lantus runs out change to [place] U200 - 110 units once daily  Health Maintenance -Vaccine gaps: Covid booster, Shingrix -Prostate cancer Dx 02/2021, completed radiation, now cancer free. Follows with urology. 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 Follows w [facility] (NP [place], Dr Zollie Scale)  Patient Goals/Self-Care Activities Patient will:  - take medications as prescribed as evidenced by patient report and record review focus on medication adherence by routine check glucose twice daily, document, and provide at future appointments engage in dietary modifications by limiting carbs, sugar     Patient verbalizes understanding of instructions and care plan provided today and agrees to view in  [place]. Active MyChart status and  patient understanding of how to access instructions and care plan via MyChart confirmed with patient.    Telephone follow up appointment with pharmacy team member scheduled for: 3-4 weeks  Charlene Brooke, PharmD, [facility] Clinical Pharmacist [facility] [phone]

## 2022-04-12 NOTE — Telephone Encounter (Signed)
Patient has been informed of Metformin increase. I gave patient my direct phone number in case of any side effects or concerns with increase. Patient has been scheduled for in-office appointment with Charlene Brooke on 04-27-2022 at 8:45.   Charlene Brooke, CPP notified  [name], [place] Clinical Pharmacy Assistant [phone]

## 2022-04-21 ENCOUNTER — Telehealth: Payer: Self-pay

## 2022-04-21 NOTE — Progress Notes (Signed)
Chronic Care Management Pharmacy Assistant   Name: Colton Porter  MRN: [id number] DOB: [birth year]  Reason for Encounter: CCM (Appointment Reminder)  Medications: Outpatient Encounter Medications as of 04/21/2022  Medication Sig   Accu-Chek FastClix Lancets MISC USE THREE TIMES DAILY TO CHECK SUGARS E65.11, INSULIN USE   ACCU-CHEK GUIDE test strip USE AS INSTRUCTED TO CHECK SUGARS THREE TIMES DAILY E11.65, INSULIN USE   acetaminophen (TYLENOL) 500 MG tablet Take 500 mg by mouth every 6 (six) hours as needed for mild pain.   allopurinol (ZYLOPRIM) 300 MG tablet TAKE 1 TABLET EVERY DAY   ASPIR-LOW 81 MG EC tablet Take 1 tablet (81 mg total) by mouth daily.   B-D UF III MINI PEN NEEDLES 31G X 5 MM MISC USE TO INJECT INSULIN DAILY   Blood Glucose Monitoring Suppl (ACCU-CHEK GUIDE) w/Device KIT 1 Units by Does not apply route as directed.   Cholecalciferol (VITAMIN D3) 2000 units TABS Take 1 tablet by mouth at bedtime.   Continuous Blood Gluc Sensor (FREESTYLE LIBRE 2 SENSOR) MISC Apply sensor every 14 days to monitor sugar continously   docusate sodium (COLACE) 100 MG capsule Take 100 mg by mouth daily as needed for mild constipation.    empagliflozin (JARDIANCE) 25 MG TABS tablet Take 1 tablet (25 mg total) by mouth daily before breakfast.   escitalopram (LEXAPRO) 10 MG tablet TAKE 1 TABLET EVERY DAY   fluticasone (FLONASE) 50 MCG/ACT nasal spray Place 2 sprays into both nostrils daily.   furosemide (LASIX) 40 MG tablet Take 1 tablet (40 mg total) by mouth daily.   glucose blood (ACCU-CHEK GUIDE) test strip Check sugars three times daily and as needed when feeling ill E11.65, insulin use   insulin glargine (LANTUS SOLOSTAR) 100 UNIT/ML Solostar Pen Inject 55 Units into the skin 2 (two) times daily. And pen needles 1/day   loratadine (CLARITIN) 10 MG tablet Take 10 mg by mouth daily.   losartan (COZAAR) 50 MG tablet TAKE 1 TABLET EVERY DAY   lovastatin (MEVACOR) 20 MG tablet TAKE 1  TABLET AT BEDTIME   magnesium oxide (MAG-OX) 400 (240 Mg) MG tablet TAKE 1 TABLET BY MOUTH TWICE A DAY   metFORMIN (GLUCOPHAGE-XR) 500 MG 24 hr tablet Take 1 tablet with breakfast and 2 tablets with dinner   methocarbamol (ROBAXIN) 500 MG tablet Take 1 tablet (500 mg total) by mouth 3 (three) times daily as needed for muscle spasms (sedation precautions).   Multiple Vitamins-Minerals ([employer] SPECTRAVITE PO) Take 1 tablet by mouth at bedtime.   mycophenolate (MYFORTIC) 360 MG TBEC EC tablet Take 360 mg by mouth 2 (two) times daily.   Omega-3 Fatty Acids (FISH OIL PO) Take 1 tablet by mouth at bedtime.   omeprazole (PRILOSEC) 40 MG capsule TAKE 1 CAPSULE EVERY DAY   potassium chloride (KLOR-CON M) 10 MEQ tablet Take by mouth.   tacrolimus (PROGRAF) 1 MG capsule Take 1-2 mg by mouth See admin instructions. Take 2 mg by mouth in the morning, then take 1 mg  by mouth in the evening   No facility-administered encounter medications on file as of 04/21/2022.   [name] was contacted to remind of upcoming in-office visit with [name] on 04/27/2022 at 8:45. Patient was reminded to have any blood glucose and blood pressure readings available for review at appointment.   Message was left reminding patient of appointment.  CCM referral has been placed prior to visit?  Yes   Star Rating Drugs: Medication:  Last  Fill: Day Supply Metformin 500 mg 03/10/2022 90 Losartan 50 mg 03/21/2022 90 Lovastatin 20 mg 04/04/2022 90 Jardiance 25 mg 04/14/2022 [place], CPP notified  [name], [place] Pharmacy Assistant [phone]

## 2022-04-26 NOTE — Progress Notes (Unsigned)
Chronic Care Management Pharmacy Note  04/27/2022 Name:  Colton Porter MRN:  [id number] DOB:  1965-10-02  Summary: CCM F/U visit -A1c 8.9% (01/2022), improved from 9.6%; he increased metformin up to 2000 mg/day and has tolerated it well, but glucose control has not improved much; pt has discussed changing Lantus to more concentrated insulin Tyler Aas U200 or Toujeu U300) and is willing to switch now -Discussed diet changes at length -Reviewed AGP report: 04/11/22 to 04/24/22. Sensor active: 73%             Time in range (70-180): 16% (goal > 70%)             High (181-250): 55%             Very high (>250): 29%             Low (< 70): 0% (goal < 4%)             GMI: 8.7%; Average glucose: 224  Recommendations/Changes made from today's visit: -Recommend to change Lantus to Tresiba 110 units once daily (no need to split dose with concentrated insulin) -Provided pt with DM diet handout to help with food choices  Plan: -Pharmacist follow up televisit scheduled for 4 weeks -PCP F/U 07/01/22   Subjective: Colton Porter is an 56 y.o. year old male who is a primary patient of Ria Bush, MD.  The CCM team was consulted for assistance with disease management and care coordination needs.    Engaged with patient by telephone for follow up visit in response to provider referral for pharmacy case management and/or care coordination services.   Consent to Services:  The patient was given information about Chronic Care Management services, agreed to services, and gave verbal consent prior to initiation of services.  Please see initial visit note for detailed documentation.   Patient Care Team: Ria Bush, MD as PCP - General (Family Medicine) Freada Bergeron, MD as PCP - Cardiology (Cardiology) Charlton Haws, [facility] as Pharmacist (Pharmacist)  Recent office visits: 03/01/22 Dr [name] OV: annual - Trig elevated, will recheck [place] next labwork. A1c 8.9%; consider  change to more potent Tresiba (U-200) or Toujeo (U-300); advised restart FL2  10/27/21 Dr Darnell Level OV: f/u DM; A1c 8.1% improved. Trial Ozempic. 06/22/21 Dr [name] OV: f/u DM. A1c 8.6 > 9.3%; increased Jardiance to 25 mg. RTC 2 weeks for BMP on higher Jardiance dose 04/29/21 - PCP message - Back pain, trial robaxin PRN   Recent consult visits:  10/26/21 Dr Erlene Quan (Urology): f/u prostate cancer. S/p radiation. Rec calcium/Vitamin D post ADT. Monitor PSA q6 months. 03/22/21 - Endocrinology - A1c improved, 8.6%. Follow up with PCP.   Hospital visits:  None in previous 6 months  Objective:  Lab Results  Component Value Date   CREATININE 1.20 02/22/2022   BUN 15 02/22/2022   GFR 68.11 02/22/2022   GFRNONAA >60 08/02/2019   GFRAA >60 08/02/2019   NA 141 02/22/2022   K 4.0 02/22/2022   CALCIUM 9.4 02/22/2022   CO2 26 02/22/2022   GLUCOSE 193 (H) 02/22/2022   Lab Results  Component Value Date/Time   HGBA1C 8.9 (H) 02/22/2022 08:16 AM   HGBA1C 8.1 (A) 10/27/2021 09:41 AM   HGBA1C 9.3 (A) 06/22/2021 09:12 AM   HGBA1C 11.5 (H) 02/08/2021 08:02 AM   FRUCTOSAMINE 436 (H) 04/22/2020 08:35 AM   FRUCTOSAMINE 232 08/02/2017 08:26 AM   GFR 68.11 02/22/2022 08:16 AM   GFR 68.61 02/08/2021 08:02 AM  MICROALBUR 0.8 02/22/2022 08:16 AM   MICROALBUR <0.7 08/27/2014 09:51 AM    Last diabetic Eye exam:  Lab Results  Component Value Date/Time   HMDIABEYEEXA No Retinopathy 11/10/2021 12:00 AM     Lab Results  Component Value Date   CHOL 102 02/22/2022   HDL 28.20 (L) 02/22/2022   LDLCALC 44 10/06/2017   LDLDIRECT 33.0 02/22/2022   TRIG (H) 02/22/2022    543.0 Triglyceride is over 400; calculations on Lipids are invalid.   CHOLHDL 4 02/22/2022       Latest Ref Rng & Units 02/22/2022    8:16 AM 04/02/2021    4:45 PM 02/08/2021    8:02 AM  Hepatic Function  Total Protein 6.0 - 8.3 g/dL 6.9  7.4  6.9   Albumin 3.5 - 5.2 g/dL 3.8   3.9   AST 0 - 37 U/L _0 ALT 0 - 53 U/L _1 Alk Phosphatase 39 - 117 U/L 91   102   Total Bilirubin 0.2 - 1.2 mg/dL 0.4  0.6  0.5     Lab Results  Component Value Date/Time   TSH 1.10 04/07/2020 08:39 AM   TSH 0.716 07/18/2019 11:33 AM   TSH 1.47 10/03/2016 08:17 AM   FREET4 0.91 11/01/2013 03:06 PM   FREET4 0.9 05/27/2009 10:50 AM       Latest Ref Rng & Units 02/22/2022    8:16 AM 05/26/2021    3:35 PM 04/02/2021    4:45 PM  CBC  WBC 4.0 - 10.5 K/uL 5.4  7.0  7.9   Hemoglobin 13.0 - 17.0 g/dL 12.0  13.1  13.8   Hematocrit 39.0 - 52.0 % 35.4  39.5  42.2   Platelets 150.0 - 400.0 K/uL 131.0  162  182     Lab Results  Component Value Date/Time   VD25OH 32.34 02/22/2022 08:16 AM   VD25OH 43.81 02/08/2021 08:02 AM    Clinical ASCVD: No  The ASCVD Risk score (Arnett DK, et al., 2019) failed to calculate for the following reasons:   The valid total cholesterol range is 130 to 320 mg/dL       03/01/2022    8:53 AM 10/27/2021   10:41 AM 02/15/2021    9:33 AM  Depression screen PHQ 2/9  Decreased Interest 0 0 0  Down, Depressed, Hopeless 0 0 0  PHQ - 2 Score 0 0 0  Altered sleeping _2 Tired, decreased energy _3 Change in appetite 0 0 1  Feeling bad or failure about yourself  0 0 0  Trouble concentrating 0 0 0  Moving slowly or fidgety/restless 0 0 0  Suicidal thoughts 0 0 0  PHQ-9 Score _4 Difficult doing work/chores Not difficult at all Not difficult at all     Social History   Tobacco Use  Smoking Status Never  Smokeless Tobacco Former   Types: Chew   BP Readings from Last 3 Encounters:  03/01/22 120/72  11/18/21 139/89  10/27/21 110/70   Pulse Readings from Last 3 Encounters:  03/01/22 76  11/18/21 89  10/27/21 84   Wt Readings from Last 3 Encounters:  03/01/22 258 lb 4 oz (117.1 kg)  11/18/21 256 lb (116.1 kg)  10/27/21 256 lb 8 oz (116.3 kg)   BMI Readings from Last 3 Encounters:  03/01/22 41.06 kg/m  11/18/21 40.70 kg/m  10/27/21 41.40 kg/m  Assessment/Interventions:  Review of patient past medical history, allergies, medications, health status, including review of consultants reports, laboratory and other test data, was performed as part of comprehensive evaluation and provision of chronic care management services.   SDOH:  (Social Determinants of Health) assessments and interventions performed: Yes SDOH Interventions    Flowsheet Row Chronic Care Management from 12/03/2021 in [place] at [place] Management from 04/13/2021 in [place] at [place] Management from 03/02/2021 in [place] at [place] from 01/22/2020 in [place] at [place] from 10/15/2018 in [place] at [place] Interventions Intervention Not Indicated -- -- -- --  Transportation Interventions Intervention Not Indicated -- -- -- --  Depression Interventions/Treatment  -- -- -- PHQ2-9 Score <4 Follow-up Not Indicated PHQ2-9 Score <4 Follow-up Not Indicated  Financial Strain Interventions -- Intervention Not Indicated Other (Comment)  [Lantus assistance program] -- --         SDOH Screenings   Food Insecurity: No Food Insecurity (12/03/2021)  Housing: Low Risk  (01/22/2020)  Transportation Needs: No Transportation Needs (12/03/2021)  Alcohol Screen: Low Risk  (01/22/2020)  Depression (PHQ2-9): Low Risk  (03/01/2022)  Financial Resource Strain: Low Risk  (04/13/2021)  Recent Concern: Financial Resource Strain - Medium Risk (03/02/2021)  Physical Activity: Inactive (01/22/2020)  Stress: No Stress Concern Present (01/22/2020)  Tobacco Use: Medium Risk (03/01/2022)    CCM Care Plan  Allergies  Allergen Reactions   Tolmetin Rash    cirrhosis   Ambien [Zolpidem Tartrate] Other (See Comments)    Over-toxicity from liver failure   Morphine And Related     Hallucinations.   Trulicity [Dulaglutide]     frequency   Hydrochlorothiazide  W-Triamterene Other (See Comments)    REACTION: dizzy, nausea   Lisinopril Other (See Comments)    REACTION: cough, decreased libido    Medications Reviewed Today     Reviewed by Ria Bush, MD (Physician) on 03/01/22 at 0807  Med List Status: <None>   Medication Order Taking? Sig Documenting Provider Last Dose Status Informant  Accu-Chek FastClix Lancets MISC [id number] Yes USE THREE TIMES DAILY TO CHECK SUGARS E65.11, INSULIN USE Ria Bush, MD Taking Active   ACCU-CHEK GUIDE test strip [id number] Yes USE AS INSTRUCTED TO CHECK SUGARS THREE TIMES DAILY E11.65, INSULIN USE Ria Bush, MD Taking Active   acetaminophen (TYLENOL) 500 MG tablet [id number] Yes Take 500 mg by mouth every 6 (six) hours as needed for mild pain. [provider] Taking Active Spouse/Significant Other  allopurinol (ZYLOPRIM) 300 MG tablet [id number] Yes TAKE 1 TABLET EVERY DAY Ria Bush, MD Taking Active   ASPIR-LOW 81 MG EC tablet [id number] Yes Take 1 tablet (81 mg total) by mouth daily. Ria Bush, MD Taking Active   B-D UF III MINI PEN NEEDLES 31G X 5 MM MISC [id number] Yes USE TO [place] Ria Bush, MD Taking Active   Blood Glucose Monitoring Suppl (ACCU-CHEK GUIDE) w/Device KIT [id number] Yes 1 Units by Does not apply route as directed. Ria Bush, MD Taking Active Spouse/Significant Other  Cholecalciferol (VITAMIN D3) 2000 units TABS [id number] Yes Take 1 tablet by mouth at bedtime. [provider] Taking Active Spouse/Significant Other  Continuous Blood Gluc Sensor (FREESTYLE LIBRE 2 SENSOR) MISC [id number] Yes Apply sensor every 14 days to monitor sugar continously Ria Bush, MD Taking Active  Med Note Charlton Haws   Wed Jan 12, 2022 11:56 AM) Pt received device - not started yet  docusate sodium (COLACE) 100 MG capsule [id number] Yes Take 100 mg by mouth daily as needed for mild constipation.  [provider] Taking Active Spouse/Significant Other  empagliflozin (JARDIANCE) 25 MG TABS tablet [id number] Yes Take 1 tablet (25 mg total) by mouth daily before breakfast. Ria Bush, MD Taking Active   escitalopram (LEXAPRO) 10 MG tablet [id number] Yes TAKE 1 TABLET EVERY DAY Ria Bush, MD Taking Active   fluticasone [facility]) 50 MCG/ACT nasal spray [id number] Yes Place 2 sprays into both nostrils daily. Ria Bush, MD Taking Active   furosemide (LASIX) 40 MG tablet [id number] Yes Take 1 tablet (40 mg total) by mouth daily. Freada Bergeron, MD Taking Active   glucose blood (ACCU-CHEK GUIDE) test strip [id number] Yes Check sugars three times daily and as needed when feeling ill E11.65, insulin use Ria Bush, MD Taking Active Spouse/Significant Other  insulin glargine (LANTUS SOLOSTAR) 100 UNIT/ML Solostar Pen [id number] Yes Inject 55 Units into the skin 2 (two) times daily. And pen needles 1/day Ria Bush, MD Taking Active   loratadine (CLARITIN) 10 MG tablet [id number] Yes Take 10 mg by mouth daily. [provider] Taking Active Spouse/Significant Other  losartan (COZAAR) 50 MG tablet [id number] Yes TAKE 1 TABLET EVERY DAY Ria Bush, MD Taking Active   lovastatin (MEVACOR) 20 MG tablet [id number] Yes TAKE 1 TABLET (20 MG TOTAL) BY MOUTH AT BEDTIME. Ria Bush, MD Taking Active   magnesium oxide (MAG-OX) 400 (240 Mg) MG tablet [id number] Yes TAKE 1 TABLET BY MOUTH TWICE A Velora Heckler, MD Taking Active   metFORMIN (GLUCOPHAGE-XR) 500 MG 24 hr tablet [id number] Yes Take 1 tablet (500 mg total) by mouth 2 (two) times daily. Ria Bush, MD Taking Active   methocarbamol (ROBAXIN) 500 MG tablet [id number] Yes Take 1 tablet (500 mg total) by mouth 3 (three) times daily as needed for muscle spasms (sedation precautions). Ria Bush, MD Taking Active   Multiple Vitamins-Minerals ([facility] SPECTRAVITE PO) [id number] Yes Take 1 tablet by mouth at  bedtime. [provider] Taking Active Spouse/Significant Other  mycophenolate (MYFORTIC) 360 MG TBEC EC tablet [id number] Yes Take 360 mg by mouth 2 (two) times daily. [provider] Taking Active   Omega-3 Fatty Acids (FISH OIL PO) [id number] Yes Take 1 tablet by mouth at bedtime. [provider] Taking Active Spouse/Significant Other  omeprazole (PRILOSEC) 40 MG capsule [id number] Yes Take 1 capsule (40 mg total) by mouth daily. Ria Bush, MD Taking Active   potassium chloride (KLOR-CON M) 10 MEQ tablet [id number] Yes Take by mouth. [provider] Taking Active   tacrolimus (PROGRAF) 1 MG capsule [id number] Yes Take 1-2 mg by mouth See admin instructions. Take 2 mg by mouth in the morning, then take 1 mg  by mouth in the evening [provider] Taking Active Spouse/Significant Other            Patient Active Problem List   Diagnosis Date Noted   Sinus congestion 06/22/2021   Medicare annual wellness visit, subsequent 02/15/2021   Immunosuppressed status ([place]) 11/10/2020   Chronic right-sided thoracic back pain 04/07/2020   Memory loss 04/07/2020   Prostate cancer ([place]) 02/06/2020   Advanced directives, counseling/discussion 02/06/2020   Pain, dental 08/21/2019   Orthostatic hypotension 07/31/2019   Syncope 07/30/2019   [place] lesion, left 02/05/2018   Acute sinusitis 10/23/2017   Chronic heel pain, left  10/14/2017   Anejaculation 08/09/2017   Chronic gout 05/22/2017   Dyspnea 11/18/2016   BPH (benign prostatic hyperplasia) 10/05/2016   Status post liver transplant ([place]) 03/15/2016   Portal hypertensive gastropathy ([place]) 12/04/2015   Type 2 diabetes mellitus with other specified complication ([place]) 24/02/7352   Anemia    Gynecomastia    CKD (chronic kidney disease) stage 3, GFR 30-59 ml/min (HCC) 11/04/2015   Depression, major, single episode, moderate ([place]) 07/09/2015   Headache 02/19/2015   Insomnia 12/06/2014    Hypomagnesemia    Other fatigue 11/18/2014   Internal hemorrhoid 10/01/2014   Erectile dysfunction 29/92/4268   Alcoholic cirrhosis of liver without ascites ([place]) 07/28/2014   (HFpEF) heart failure with preserved ejection fraction ([place]) 11/01/2013   Essential hypertension 06/04/2013   OSA on CPAP 07/11/2012   History of alcohol dependence ([place]) 07/11/2012   Thrombocytopenia ([place]) 12/15/2011   Morbid obesity with BMI of 40.0-44.9, adult ([place]) 11/16/2011   Routine general medical examination at a health care facility 09/22/2010   NEUROPATHY 05/27/2009   Dyslipidemia associated with type 2 diabetes mellitus ([place]) 02/15/2007   Allergic rhinitis 02/15/2007   GERD 02/15/2007    Immunization History  Administered Date(s) Administered   Hep A / Hep B 01/16/2015, 01/23/2015, 02/06/2015   Hepatitis B, adult 12/04/2015   Influenza Whole 03/24/2010   Influenza,inj,Quad PF,6+ Mos 04/22/2015, 04/06/2017, 04/23/2018, 04/23/2019, 04/07/2020, 06/22/2021, 03/01/2022   PFIZER(Purple Top)SARS-COV-2 Vaccination 11/07/2019, 11/28/2019, 05/07/2020, 12/04/2020   PNEUMOCOCCAL CONJUGATE-20 02/15/2021   Pneumococcal Polysaccharide-23 04/22/2015   Td 05/01/2007   Tdap 12/14/2015, 10/05/2020    Conditions to be addressed/monitored:  Hypertension, Hyperlipidemia, and Diabetes  Care Plan : [place]  Updates made by Charlton Haws, [place] since 04/27/2022 12:00 AM     Problem: Hypertension, Hyperlipidemia, and Diabetes   Priority: High     Long-Range Goal: Disease Management   Start Date: 03/02/2021  Expected End Date: 07/30/2022  This Visit's Progress: Not on track  Recent Progress: Not on track  Priority: High  Note:   Current Barriers:  Unable to achieve control of diabetes  Pharmacist Clinical Goal(s):  Patient will contact provider office for questions/concerns as evidenced notation of same in electronic health record through collaboration with PharmD and provider.    Interventions: 1:1 collaboration with Ria Bush, MD regarding development and update of comprehensive plan of care as evidenced by provider attestation and co-signature Inter-disciplinary care team collaboration (see longitudinal plan of care) Comprehensive medication review performed; medication list updated in electronic medical record  Hypertension / HFpEF (BP goal <140/90) -Controlled - BP at goal; Denies hypotensive/hypertensive symptoms -Last ejection fraction: 65-70% (Date: 07/2019) -HF type: Diastolic;  NYHA Class: II (slight limitation of activity) -Current treatment: Losartan 50 mg daily - Appropriate, Effective, Safe, Accessible Furosemide 40 mg daily - Appropriate, Effective, Safe, Accessible -Educated on BP goals and benefits of medications for prevention of heart attack, stroke and kidney damage; -Counseled to monitor BP at home periodically -Recommended to continue current medication   Hyperlipidemia: (LDL goal < 100) -Controlled - LDL 38 (01/2021); but TG elevated above goal secondary to DM. -Current treatment: Lovastatin 20 mg daily HS - Appropriate, Effective, Safe, Accessible -Medications previously tried: none  -Recommended to continue current medication  Diabetes (A1c goal <7%) -Uncontrolled - A1c 8.9% (01/2022) improved from 9.6%; pt has increased metformin up to 2000 mg/day, denies side effects; he brought [place] reader in today for download -Current home glucose readings: -Reviewed AGP report: 04/11/22 to 04/24/22. Sensor active:  73%             Time in range (70-180): 16% (goal > 70%)             High (181-250): 55%             Very high (>250): 29%             Low (< 70): 0% (goal < 4%)             GMI: 8.7%; Average glucose: 224  -Previous AGP report: 03/26/22 to 04/08/22. Sensor active: 76%  Time in range (70-180): 31% (goal > 70%)  High (>180): 69%  Low (< 70): 0% (goal < 4%)  GMI: 8.5%; Average glucose: 217  -Current medications: Jardiance  25 mg daily (PAP) - Appropriate, Query Effective Lantus Solostar 55 units BID-Appropriate, Query Effective Metformin XR 500 mg -2 tab BID -Appropriate, Query Effective Freestyle Libre 2 (reader) -  -Medications previously tried: Trulicity (diarrhea, cost), Ozempic (never started, declined PAP), glimepiride (2018-2022) -Pt is taking 0.94 u/kg/day of basal insulin, would consider this over-basalization and further increasing the dose will probably not help with BG; there may be small benefit from switching to a more concentrated insulin Tyler Aas U200 or Toujeo U300) but unlikely to achieve DM goals with this alone. Pt is ok with switching insulin once his current supply of Lantus runs out; -Reviewed next options for DM control: consider restarting glipizide or adding bolus insulin -Counseled extensively on diet - provided with DM diet handout with plate method, nutrition label reading; advised to limit snacks to < 15 g carb -Recommend to change Lantus to [place] 110 units once daily (no need to split dose with concentrated insulin)  Health Maintenance -Vaccine gaps: Covid booster, Shingrix -Prostate cancer Dx 02/2021, completed radiation, now cancer free. Follows with urology. 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 Follows w [facility] ([place], Dr Zollie Scale)  Patient Goals/Self-Care Activities Patient will:  - take medications as prescribed as evidenced by patient report and record review focus on medication adherence by routine check glucose twice daily, document, and provide at future appointments engage in dietary modifications by limiting carbs, sugar      Medication Assistance:  Lantus ([employer]ofi PAP) Jardiance (PAN foundation) - approved through 07/25/22  Member ID: [id number] Group ID: [id number] RX BIN: [id number] PCN: [id number]  Compliance/Adherence/Medication fill history: Care Gaps: Eye exam due   Star rating drugs: Metformin XR 596m - PDC  100% Losartan 522m- PDC 100% Lovastatin 2025m PDC 100% Jardiance - PDC 100%  Medication Access: Within the past 30 days, how often has patient missed a dose of medication? 0 Is a pillbox or other method used to improve adherence? Yes  Factors that may affect medication adherence? financial need Are meds synced by current pharmacy? No  Are meds delivered by current pharmacy? Yes  Does patient experience delays in picking up medications due to transportation concerns? No   [facility] Services Reviewed: Is patient disadvantaged to use [facility]?: Yes  Current Rx insurance plan: [employer] [place] Name and location of [facility]:  [id number] [place] [id number]: [phone]: [phone] [address]: [phone]: [phone] Pharmacy services reviewed with patient today?: No  Patient requests to transfer care to  [facility]?: No  Reason patient declined to change pharmacies: Disadvantaged due to insurance/mail order   Care Plan and Follow Up Patient Decision:  Patient agrees to Care Plan and Follow-up.  Follow Up Plan: Telephone follow up appointment with care management team member scheduled for: 1 month  Charlene Brooke, PharmD, BCACP Clinical Pharmacist [facility] at [facility] [phone]

## 2022-04-27 ENCOUNTER — Telehealth: Payer: Self-pay | Admitting: Pharmacist

## 2022-04-27 ENCOUNTER — Ambulatory Visit: Payer: Medicare HMO | Admitting: Pharmacist

## 2022-04-27 DIAGNOSIS — E1169 Type 2 diabetes mellitus with other specified complication: Secondary | ICD-10-CM

## 2022-04-27 DIAGNOSIS — I10 Essential (primary) hypertension: Secondary | ICD-10-CM

## 2022-04-27 MED ORDER — METFORMIN HCL ER 500 MG PO TB24: 1000.0000 mg | ORAL_TABLET | Freq: Two times a day (BID) | ORAL | 0 refills | Status: DC

## 2022-04-27 NOTE — Patient Instructions (Signed)
Visit Information  Phone number for Pharmacist: [phone]   Goals Addressed   None     Care Plan : [place]  Updates made by Charlton Haws, RPH since 04/27/2022 12:00 AM     Problem: Hypertension, Hyperlipidemia, and Diabetes   Priority: High     Long-Range Goal: Disease Management   Start Date: 03/02/2021  Expected End Date: 07/30/2022  This Visit's Progress: Not on track  Recent Progress: Not on track  Priority: High  Note:   Current Barriers:  Unable to achieve control of diabetes  Pharmacist Clinical Goal(s):  Patient will contact provider office for questions/concerns as evidenced notation of same in electronic health record through collaboration with PharmD and provider.   Interventions: 1:1 collaboration with Ria Bush, MD regarding development and update of comprehensive plan of care as evidenced by provider attestation and co-signature Inter-disciplinary care team collaboration (see longitudinal plan of care) Comprehensive medication review performed; medication list updated in electronic medical record  Hypertension / HFpEF (BP goal <140/90) -Controlled - BP at goal; Denies hypotensive/hypertensive symptoms -Last ejection fraction: 65-70% (Date: 07/2019) -HF type: Diastolic;  NYHA Class: II (slight limitation of activity) -Current treatment: Losartan 50 mg daily - Appropriate, Effective, Safe, Accessible Furosemide 40 mg daily - Appropriate, Effective, Safe, Accessible -Educated on BP goals and benefits of medications for prevention of heart attack, stroke and kidney damage; -Counseled to monitor BP at home periodically -Recommended to continue current medication   Hyperlipidemia: (LDL goal < 100) -Controlled - LDL 38 (01/2021); but TG elevated above goal secondary to DM. -Current treatment: Lovastatin 20 mg daily HS - Appropriate, Effective, Safe, Accessible -Medications previously tried: none  -Recommended to continue current  medication  Diabetes (A1c goal <7%) -Uncontrolled - A1c 8.9% (01/2022) improved from 9.6%; pt has increased metformin up to 2000 mg/day, denies side effects; he brought [place] reader in today for download -Current home glucose readings: -Reviewed AGP report: 04/11/22 to 04/24/22. Sensor active: 73%             Time in range (70-180): 16% (goal > 70%)             High (181-250): 55%             Very high (>250): 29%             Low (< 70): 0% (goal < 4%)             GMI: 8.7%; Average glucose: 224  -Previous AGP report: 03/26/22 to 04/08/22. Sensor active: 76%  Time in range (70-180): 31% (goal > 70%)  High (>180): 69%  Low (< 70): 0% (goal < 4%)  GMI: 8.5%; Average glucose: 217  -Current medications: Jardiance 25 mg daily (PAP) - Appropriate, Query Effective Lantus Solostar 55 units BID-Appropriate, Query Effective Metformin XR 500 mg -2 tab BID -Appropriate, Query Effective Freestyle Libre 2 (reader) -  -Medications previously tried: Trulicity (diarrhea, cost), Ozempic (never started, declined PAP), glimepiride (2018-2022) -Pt is taking 0.94 u/kg/day of basal insulin, would consider this over-basalization and further increasing the dose will probably not help with BG; there may be small benefit from switching to a more concentrated insulin [place] U200 or [place] U300) but unlikely to achieve DM goals with this alone. Pt is ok with switching insulin once his current supply of Lantus runs out; -Reviewed next options for DM control: consider restarting glipizide or adding bolus insulin -Counseled extensively on diet - provided with DM diet handout with plate method,  nutrition label reading; advised to limit snacks to < 15 g carb -Recommend to change Lantus to [place] 110 units once daily (no need to split dose with concentrated insulin)  Health Maintenance -Vaccine gaps: Covid booster, Shingrix -Prostate cancer Dx 02/2021, completed radiation, now cancer free. Follows with urology. -Hx  Liver transplant 12/2015, maintained on Tacrolimus and Myfortic. Follows w [facility] ([facility] [place], Dr Zollie Scale)  Patient Goals/Self-Care Activities Patient will:  - take medications as prescribed as evidenced by patient report and record review focus on medication adherence by routine check glucose twice daily, document, and provide at future appointments engage in dietary modifications by limiting carbs, sugar      Patient verbalizes understanding of instructions and care plan provided today and agrees to view in [place]. Active MyChart status and patient understanding of how to access instructions and care plan via MyChart confirmed with patient.    Telephone follow up appointment with pharmacy team member scheduled for: 4 weeks  Charlene Brooke, PharmD, BCACP Clinical Pharmacist [facility] [phone]

## 2022-04-27 NOTE — Telephone Encounter (Signed)
Patient has been taking metformin 1000 mg BID for the past 2.5 weeks and denies side effects. Reviewed CGM data (below), sugar control has not improved.  He continues on Lantus 55 units BID and Jardiance 25 mg daily as well. Previously pt and PCP have discussed changing insulin to concentrated form [place] Aas U200 or [place] U300) which may help some, pt is willing to switch now.   Recommend to change Lantus to [place] U200 - can take 110 units once daily, no need to split dose with concentrated form Also discussed diet at length - pt provided with handout on optimizing food choices, plate method and advised to limit snacks to < 15 g carb.  Reviewed AGP report: 04/11/22 to 04/24/22. Sensor active: 73%  Time in range (70-180): 16% (goal > 70%)  High (181-250): 55%  Very high (>250): 29%  Low (< 70): 0% (goal < 4%)  GMI: 8.7%; Average glucose: 224

## 2022-04-28 ENCOUNTER — Other Ambulatory Visit: Payer: Medicare HMO

## 2022-04-28 ENCOUNTER — Ambulatory Visit: Payer: Medicare HMO

## 2022-04-28 DIAGNOSIS — C61 Malignant neoplasm of prostate: Secondary | ICD-10-CM

## 2022-04-28 MED ORDER — TRESIBA FLEXTOUCH 200 UNIT/ML ~~LOC~~ SOPN: 110.0000 [IU] | PEN_INJECTOR | Freq: Every day | SUBCUTANEOUS | 0 refills | Status: DC

## 2022-04-28 NOTE — Telephone Encounter (Signed)
Thank you. Agree with this - I've sent tresiba to pharmacy to take in place of lantus.  Continue metformin and jardiance.

## 2022-04-29 LAB — PSA: Prostate Specific Ag, Serum: <0.1 ng/mL (ref 0.0–4.0)

## 2022-05-09 DIAGNOSIS — E1169 Type 2 diabetes mellitus with other specified complication: Secondary | ICD-10-CM | POA: Diagnosis not present

## 2022-05-11 ENCOUNTER — Telehealth: Payer: Self-pay | Admitting: Pharmacist

## 2022-05-11 DIAGNOSIS — E1169 Type 2 diabetes mellitus with other specified complication: Secondary | ICD-10-CM

## 2022-05-11 MED ORDER — METFORMIN HCL ER 500 MG PO TB24: 1000.0000 mg | ORAL_TABLET | Freq: Two times a day (BID) | ORAL | 3 refills | Status: DC

## 2022-05-11 NOTE — Telephone Encounter (Signed)
E-scribed refill 

## 2022-05-11 NOTE — Telephone Encounter (Signed)
Patient requests refill for metformin XR 500 mg- he had increased dose to 4 tablets/daily so he ran out early. Current dose is 500 mg - 2 tab twice daily.  [facility]: [id number] [place] [place] [id number]: [phone]: [phone]

## 2022-05-12 ENCOUNTER — Other Ambulatory Visit: Payer: Self-pay | Admitting: *Deleted

## 2022-05-12 DIAGNOSIS — C61 Malignant neoplasm of prostate: Secondary | ICD-10-CM

## 2022-05-12 MED ORDER — METFORMIN HCL ER 500 MG PO TB24: 1000.0000 mg | ORAL_TABLET | Freq: Two times a day (BID) | ORAL | 3 refills | Status: DC

## 2022-05-12 NOTE — Telephone Encounter (Signed)
Patient has been notified.  Charlene Brooke, CPP notified  [name], [place] Clinical Pharmacy Assistant [phone]

## 2022-05-12 NOTE — Telephone Encounter (Signed)
Thanks for letting me know.  I made sure to click correct option today and sent refill.

## 2022-05-12 NOTE — Progress Notes (Signed)
Patient called stating he has been two days without his Metformin. Please [facility] [address].   Charlene Brooke, CPP notified  [name], [place] Clinical Pharmacy Assistant [phone]

## 2022-05-12 NOTE — Addendum Note (Signed)
Addended by: [name] on: 37/99/0940 10:13 AM   Modules accepted: Orders

## 2022-05-12 NOTE — Telephone Encounter (Signed)
Patient called me back - [facility]. Per chart looks like it was ordered yesterday - however eRx was unsuccessful (order class was "No Print" - needs to be updated to "normal")

## 2022-05-12 NOTE — Telephone Encounter (Signed)
Noted! Please inform patient Rx has been sent to pharmacy

## 2022-05-16 ENCOUNTER — Other Ambulatory Visit: Payer: Self-pay

## 2022-05-16 ENCOUNTER — Inpatient Hospital Stay: Payer: Medicare HMO | Attending: Radiation Oncology

## 2022-05-16 MED ORDER — FUROSEMIDE 40 MG PO TABS: 40.0000 mg | ORAL_TABLET | Freq: Every day | ORAL | 0 refills | Status: DC

## 2022-05-23 ENCOUNTER — Ambulatory Visit: Payer: Medicare HMO | Admitting: Radiation Oncology

## 2022-06-01 ENCOUNTER — Ambulatory Visit
Admission: RE | Admit: 2022-06-01 | Discharge: 2022-06-01 | Disposition: A | Discharge status: Home / Self Care | Payer: Medicare HMO | Source: Ambulatory Visit | Attending: Radiation Oncology | Admitting: Radiation Oncology

## 2022-06-01 ENCOUNTER — Other Ambulatory Visit: Payer: Self-pay | Admitting: *Deleted

## 2022-06-01 ENCOUNTER — Telehealth: Payer: Self-pay

## 2022-06-01 ENCOUNTER — Ambulatory Visit: Payer: Medicare HMO

## 2022-06-01 ENCOUNTER — Encounter: Payer: Self-pay | Admitting: Radiation Oncology

## 2022-06-01 VITALS — BP 146/89 | HR 89 | Temp 97.1°F | Resp 20 | Ht 66.5 in | Wt 257.0 lb

## 2022-06-01 DIAGNOSIS — Z923 Personal history of irradiation: Secondary | ICD-10-CM | POA: Insufficient documentation

## 2022-06-01 DIAGNOSIS — C61 Malignant neoplasm of prostate: Secondary | ICD-10-CM | POA: Insufficient documentation

## 2022-06-01 DIAGNOSIS — Z191 Hormone sensitive malignancy status: Secondary | ICD-10-CM | POA: Diagnosis not present

## 2022-06-01 NOTE — Progress Notes (Signed)
Chronic Care Management Pharmacy Assistant   Name: Colton Porter  MRN: [id number] DOB: 1965-09-17  Reason for Encounter: CCM (Appointment Reminder)  Medications: Outpatient Encounter Medications as of 06/01/2022  Medication Sig   Accu-Chek FastClix Lancets MISC USE THREE TIMES DAILY TO CHECK SUGARS E65.11, INSULIN USE   ACCU-CHEK GUIDE test strip USE AS INSTRUCTED TO CHECK SUGARS THREE TIMES DAILY E11.65, INSULIN USE   acetaminophen (TYLENOL) 500 MG tablet Take 500 mg by mouth every 6 (six) hours as needed for mild pain.   allopurinol (ZYLOPRIM) 300 MG tablet TAKE 1 TABLET EVERY DAY   ASPIR-LOW 81 MG EC tablet Take 1 tablet (81 mg total) by mouth daily.   B-D UF III MINI PEN NEEDLES 31G X 5 MM MISC USE TO INJECT INSULIN DAILY   Blood Glucose Monitoring Suppl (ACCU-CHEK GUIDE) w/Device KIT 1 Units by Does not apply route as directed.   Cholecalciferol (VITAMIN D3) 2000 units TABS Take 1 tablet by mouth at bedtime.   Continuous Blood Gluc Sensor (FREESTYLE LIBRE 2 SENSOR) MISC Apply sensor every 14 days to monitor sugar continously   docusate sodium (COLACE) 100 MG capsule Take 100 mg by mouth daily as needed for mild constipation.    empagliflozin (JARDIANCE) 25 MG TABS tablet Take 1 tablet (25 mg total) by mouth daily before breakfast.   escitalopram (LEXAPRO) 10 MG tablet TAKE 1 TABLET EVERY DAY   fluticasone (FLONASE) 50 MCG/ACT nasal spray Place 2 sprays into both nostrils daily.   furosemide (LASIX) 40 MG tablet Take 1 tablet (40 mg total) by mouth daily.   glucose blood (ACCU-CHEK GUIDE) test strip Check sugars three times daily and as needed when feeling ill E11.65, insulin use   insulin degludec (TRESIBA FLEXTOUCH) 200 UNIT/ML FlexTouch Pen Inject 110 Units into the skin daily.   loratadine (CLARITIN) 10 MG tablet Take 10 mg by mouth daily.   losartan (COZAAR) 50 MG tablet TAKE 1 TABLET EVERY DAY   lovastatin (MEVACOR) 20 MG tablet TAKE 1 TABLET AT BEDTIME   magnesium oxide  (MAG-OX) 400 (240 Mg) MG tablet TAKE 1 TABLET BY MOUTH TWICE A DAY   metFORMIN (GLUCOPHAGE-XR) 500 MG 24 hr tablet Take 2 tablets (1,000 mg total) by mouth 2 (two) times daily with a meal.   methocarbamol (ROBAXIN) 500 MG tablet Take 1 tablet (500 mg total) by mouth 3 (three) times daily as needed for muscle spasms (sedation precautions).   Multiple Vitamins-Minerals ([facility] SPECTRAVITE PO) Take 1 tablet by mouth at bedtime.   mycophenolate (MYFORTIC) 360 MG TBEC EC tablet Take 360 mg by mouth 2 (two) times daily.   Omega-3 Fatty Acids (FISH OIL PO) Take 1 tablet by mouth at bedtime.   omeprazole (PRILOSEC) 40 MG capsule TAKE 1 CAPSULE EVERY DAY   potassium chloride (KLOR-CON M) 10 MEQ tablet Take by mouth.   tacrolimus (PROGRAF) 1 MG capsule Take 1-2 mg by mouth See admin instructions. Take 2 mg by mouth in the morning, then take 1 mg  by mouth in the evening   No facility-administered encounter medications on file as of 06/01/2022.   [name] was contacted to remind of upcoming office visit with Charlene Brooke on 06/06/2022 at 10:15. Patient was reminded to have any blood glucose and blood pressure readings available for review at appointment.   Message was left reminding patient of appointment.  CCM referral has been placed prior to visit?  Yes   Star Rating Drugs: Medication:  Last Fill: Day  Supply Jardiance 25 mg 04/14/2022 90 Losartan 50 mg 03/21/2022 90 Lovastatin 20 mg 04/04/2022 90 Metformin 500 mg 05/12/2022 [place], CPP notified  [name], [place] Clinical Pharmacy Assistant [phone]

## 2022-06-01 NOTE — Progress Notes (Signed)
Radiation Oncology Follow up Note  Name: Colton Porter   Date:   06/01/2022 MRN:  [id number] DOB: August 28, 1965    This 56 y.o. 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  Seen today in routine follow-up he is doing well specifically denies any increased lower urinary tract symptoms diarrhea or fatigue his most recent PSA is less than 0.1..Marland Kitchen COMPLICATIONS OF TREATMENT: none  FOLLOW UP COMPLIANCE: keeps appointments   PHYSICAL EXAM:  BP (!) 146/89 (BP Location: Right Arm, Patient Position: Sitting, Cuff Size: Large)   Pulse 89   Temp (!) 97.1 F (36.2 C) (Tympanic)   Resp 20   Ht 5' 6.5" (1.689 m)   Wt 257 lb (116.6 kg)   BMI 40.86 kg/m well-developed well-nourished patient in NAD. HEENT reveals PERLA, EOMI, discs not visualized.  Oral cavity is clear. No oral mucosal lesions are identified. Neck is clear without evidence of cervical or supraclavicular adenopathy. Lungs are clear to A&P. Cardiac examination is essentially unremarkable with regular rate and rhythm without murmur rub or thrill. Abdomen is benign with no organomegaly or masses noted. Motor sensory and DTR levels are equal and symmetric in the upper and lower extremities. Cranial nerves II through XII are grossly intact. Proprioception is intact. No peripheral adenopathy or edema is identified. No motor or sensory levels are noted. Crude visual fields are within normal range.    RADIOLOGY RESULTS: No current films to review  PLAN: Present time patient is doing well excellent biochemical control of his prostate cancer.  And pleased with his overall progress and low side effect profile.  Of asked to see him back in 6 months with a  repeat PSA.  Patient is to call with any concerns.  I would like to take this opportunity to thank you for allowing me to participate in the care of your patient..Marland Kitchen

## 2022-06-02 ENCOUNTER — Other Ambulatory Visit: Payer: Self-pay | Admitting: Family Medicine

## 2022-06-02 DIAGNOSIS — M1A0790 Idiopathic chronic gout, unspecified ankle and foot, without tophus (tophi): Secondary | ICD-10-CM

## 2022-06-05 ENCOUNTER — Other Ambulatory Visit: Payer: Self-pay | Admitting: Family Medicine

## 2022-06-05 DIAGNOSIS — J302 Other seasonal allergic rhinitis: Secondary | ICD-10-CM

## 2022-06-06 ENCOUNTER — Encounter: Payer: Self-pay | Admitting: Family Medicine

## 2022-06-06 ENCOUNTER — Ambulatory Visit: Payer: Medicare HMO

## 2022-06-06 NOTE — Progress Notes (Deleted)
 Chronic Care Management Pharmacy Note  06/06/2022 Name:  Colton Porter MRN:  [id number] DOB:  12/04/1965  Summary: CCM F/U visit -A1c 8.9% (01/2022), improved from 9.6%; he increased metformin up to 2000 mg/day and has tolerated it well, but glucose control has not improved much; pt has discussed changing Lantus to more concentrated insulin (Tresiba U200 or Toujeu U300) and is willing to switch now -Discussed diet changes at length -Reviewed AGP report: 04/11/22 to 04/24/22. Sensor active: 73%             Time in range (70-180): 16% (goal > 70%)             High (181-250): 55%             Very high (>250): 29%             Low (< 70): 0% (goal < 4%)             GMI: 8.7%; Average glucose: 224  Recommendations/Changes made from today's visit: -Recommend to change Lantus to Tresiba 110 units once daily (no need to split dose with concentrated insulin) -Provided pt with DM diet handout to help with food choices  Plan: -Pharmacist follow up televisit scheduled for 4 weeks -PCP F/U 07/01/22   Subjective: Colton Porter is an 56 y.o. year old male who is a primary patient of Gutierrez, Javier, MD.  The CCM team was consulted for assistance with disease management and care coordination needs.    Engaged with patient by telephone for follow up visit in response to provider referral for pharmacy case management and/or care coordination services.   Consent to Services:  The patient was given information about Chronic Care Management services, agreed to services, and gave verbal consent prior to initiation of services.  Please see initial visit note for detailed documentation.   Patient Care Team: Gutierrez, Javier, MD as PCP - General (Family Medicine) Pemberton, Heather E, MD as PCP - Cardiology (Cardiology) Foltanski, Lindsey N, RPH as Pharmacist (Pharmacist)  Recent office visits: 03/01/22 Dr Gutierrez OV: annual - Trig elevated, will recheck FLP next labwork. A1c 8.9%; consider  change to more potent Tresiba (U-200) or Toujeo (U-300); advised restart FL2  10/27/21 Dr G OV: f/u DM; A1c 8.1% improved. Trial Ozempic. 06/22/21 Dr Gutierrez OV: f/u DM. A1c 8.6 > 9.3%; increased Jardiance to 25 mg. RTC 2 weeks for BMP on higher Jardiance dose 04/29/21 - PCP message - Back pain, trial robaxin PRN   Recent consult visits:  10/26/21 Dr Brandon (Urology): f/u prostate cancer. S/p radiation. Rec calcium/Vitamin D post ADT. Monitor PSA q6 months. 03/22/21 - Endocrinology - A1c improved, 8.6%. Follow up with PCP.   Hospital visits:  None in previous 6 months  Objective:  Lab Results  Component Value Date   CREATININE 1.20 02/22/2022   BUN 15 02/22/2022   GFR 68.11 02/22/2022   GFRNONAA >60 08/02/2019   GFRAA >60 08/02/2019   NA 141 02/22/2022   K 4.0 02/22/2022   CALCIUM 9.4 02/22/2022   CO2 26 02/22/2022   GLUCOSE 193 (H) 02/22/2022   Lab Results  Component Value Date/Time   HGBA1C 8.9 (H) 02/22/2022 08:16 AM   HGBA1C 8.1 (A) 10/27/2021 09:41 AM   HGBA1C 9.3 (A) 06/22/2021 09:12 AM   HGBA1C 11.5 (H) 02/08/2021 08:02 AM   FRUCTOSAMINE 436 (H) 04/22/2020 08:35 AM   FRUCTOSAMINE 232 08/02/2017 08:26 AM   GFR 68.11 02/22/2022 08:16 AM   GFR 68.61 02/08/2021 08:02 AM     MICROALBUR 0.8 02/22/2022 08:16 AM   MICROALBUR <0.7 08/27/2014 09:51 AM    Last diabetic Eye exam:  Lab Results  Component Value Date/Time   HMDIABEYEEXA No Retinopathy 11/10/2021 12:00 AM     Lab Results  Component Value Date   CHOL 102 02/22/2022   HDL 28.20 (L) 02/22/2022   LDLCALC 44 10/06/2017   LDLDIRECT 33.0 02/22/2022   TRIG (H) 02/22/2022    543.0 Triglyceride is over 400; calculations on Lipids are invalid.   CHOLHDL 4 02/22/2022       Latest Ref Rng & Units 02/22/2022    8:16 AM 04/02/2021    4:45 PM 02/08/2021    8:02 AM  Hepatic Function  Total Protein 6.0 - 8.3 g/dL 6.9  7.4  6.9   Albumin 3.5 - 5.2 g/dL 3.8   3.9   AST 0 - 37 U/L _0 ALT 0 - 53 U/L _1 Alk Phosphatase 39 - 117 U/L 91   102   Total Bilirubin 0.2 - 1.2 mg/dL 0.4  0.6  0.5     Lab Results  Component Value Date/Time   TSH 1.10 04/07/2020 08:39 AM   TSH 0.716 07/18/2019 11:33 AM   TSH 1.47 10/03/2016 08:17 AM   FREET4 0.91 11/01/2013 03:06 PM   FREET4 0.9 05/27/2009 10:50 AM       Latest Ref Rng & Units 02/22/2022    8:16 AM 05/26/2021    3:35 PM 04/02/2021    4:45 PM  CBC  WBC 4.0 - 10.5 K/uL 5.4  7.0  7.9   Hemoglobin 13.0 - 17.0 g/dL 12.0  13.1  13.8   Hematocrit 39.0 - 52.0 % 35.4  39.5  42.2   Platelets 150.0 - 400.0 K/uL 131.0  162  182     Lab Results  Component Value Date/Time   VD25OH 32.34 02/22/2022 08:16 AM   VD25OH 43.81 02/08/2021 08:02 AM    Clinical ASCVD: No  The ASCVD Risk score (Arnett DK, et al., 2019) failed to calculate for the following reasons:   The valid total cholesterol range is 130 to 320 mg/dL       03/01/2022    8:53 AM 10/27/2021   10:41 AM 02/15/2021    9:33 AM  Depression screen PHQ 2/9  Decreased Interest 0 0 0  Down, Depressed, Hopeless 0 0 0  PHQ - 2 Score 0 0 0  Altered sleeping _2 Tired, decreased energy _3 Change in appetite 0 0 1  Feeling bad or failure about yourself  0 0 0  Trouble concentrating 0 0 0  Moving slowly or fidgety/restless 0 0 0  Suicidal thoughts 0 0 0  PHQ-9 Score _4 Difficult doing work/chores Not difficult at all Not difficult at all     Social History   Tobacco Use  Smoking Status Never  Smokeless Tobacco Former   Types: Chew   BP Readings from Last 3 Encounters:  06/01/22 (!) 146/89  03/01/22 120/72  11/18/21 139/89   Pulse Readings from Last 3 Encounters:  06/01/22 89  03/01/22 76  11/18/21 89   Wt Readings from Last 3 Encounters:  06/01/22 257 lb (116.6 kg)  03/01/22 258 lb 4 oz (117.1 kg)  11/18/21 256 lb (116.1 kg)   BMI Readings from Last 3 Encounters:  06/01/22 40.86 kg/m  03/01/22 41.06 kg/m  11/18/21 40.70 kg/m  Assessment/Interventions:  Review of patient past medical history, allergies, medications, health status, including review of consultants reports, laboratory and other test data, was performed as part of comprehensive evaluation and provision of chronic care management services.   SDOH:  (Social Determinants of Health) assessments and interventions performed: Yes SDOH Interventions    Flowsheet Row Chronic Care Management from 12/03/2021 in [facility] Chronic Care Management from 04/13/2021 in [facility] Chronic Care Management from 03/02/2021 in [facility] Clinical Support from 01/22/2020 in [facility] Clinical Support from 10/15/2018 in [facility]  SDOH Interventions       Food Insecurity Interventions Intervention Not Indicated -- -- -- --  Transportation Interventions Intervention Not Indicated -- -- -- --  Depression Interventions/Treatment  -- -- -- PHQ2-9 Score <4 Follow-up Not Indicated PHQ2-9 Score <4 Follow-up Not Indicated  Financial Strain Interventions -- Intervention Not Indicated Other (Comment)  [Lantus assistance program] -- --         SDOH Screenings   Food Insecurity: No Food Insecurity (12/03/2021)  Housing: Low Risk  (01/22/2020)  Transportation Needs: No Transportation Needs (12/03/2021)  Alcohol Screen: Low Risk  (01/22/2020)  Depression (PHQ2-9): Low Risk  (03/01/2022)  Financial Resource Strain: Low Risk  (04/13/2021)  Recent Concern: Financial Resource Strain - Medium Risk (03/02/2021)  Physical Activity: Inactive (01/22/2020)  Stress: No Stress Concern Present (01/22/2020)  Tobacco Use: Medium Risk (06/01/2022)    CCM Care Plan  Allergies  Allergen Reactions   Tolmetin Rash    cirrhosis   Ambien [Zolpidem Tartrate] Other (See Comments)    Over-toxicity from liver failure   Morphine And Related     Hallucinations.   Trulicity [Dulaglutide]     frequency   Hydrochlorothiazide  W-Triamterene Other (See Comments)    REACTION: dizzy, nausea   Lisinopril Other (See Comments)    REACTION: cough, decreased libido    Medications Reviewed Today     Reviewed by Miceli, Loretta T, RN (Registered Nurse) on 06/01/22 at 1126  Med List Status: <None>   Medication Order Taking? Sig Documenting Provider Last Dose Status Informant  Accu-Chek FastClix Lancets MISC [id number] Yes USE THREE TIMES DAILY TO CHECK SUGARS E65.11, INSULIN USE Gutierrez, Javier, MD Taking Active   ACCU-CHEK GUIDE test strip [id number] Yes USE AS INSTRUCTED TO CHECK SUGARS THREE TIMES DAILY E11.65, INSULIN USE Gutierrez, Javier, MD Taking Active   acetaminophen (TYLENOL) 500 MG tablet [id number] Yes Take 500 mg by mouth every 6 (six) hours as needed for mild pain. [provider] Taking Active Spouse/Significant Other  allopurinol (ZYLOPRIM) 300 MG tablet [id number] Yes TAKE 1 TABLET EVERY DAY Gutierrez, Javier, MD Taking Active   ASPIR-LOW 81 MG EC tablet [id number] Yes Take 1 tablet (81 mg total) by mouth daily. Gutierrez, Javier, MD Taking Active   B-D UF III MINI PEN NEEDLES 31G X 5 MM MISC [id number] Yes USE TO INJECT INSULIN DAILY Gutierrez, Javier, MD Taking Active   Blood Glucose Monitoring Suppl (ACCU-CHEK GUIDE) w/Device KIT [id number] Yes 1 Units by Does not apply route as directed. Gutierrez, Javier, MD Taking Active Spouse/Significant Other  Cholecalciferol (VITAMIN D3) 2000 units TABS [id number] Yes Take 1 tablet by mouth at bedtime. [provider] Taking Active Spouse/Significant Other  Continuous Blood Gluc Sensor (FREESTYLE LIBRE 2 SENSOR) MISC [id number] Yes Apply sensor every 14 days to monitor sugar continously Gutierrez, Javier, MD Taking Active              Med Note Charlton Haws   Fri Apr 08, 2022  3:48 PM)    docusate sodium (COLACE) 100 MG capsule [id number] Yes Take 100 mg by mouth daily as needed for mild constipation.  [provider] Taking Active  Spouse/Significant Other  empagliflozin (JARDIANCE) 25 MG TABS tablet [id number] Yes Take 1 tablet (25 mg total) by mouth daily before breakfast. Ria Bush, MD Taking Active   escitalopram (LEXAPRO) 10 MG tablet [id number] Yes TAKE 1 TABLET EVERY DAY Ria Bush, MD Taking Active   fluticasone [facility]) 50 MCG/ACT nasal spray [id number] Yes Place 2 sprays into both nostrils daily. Ria Bush, MD Taking Active   furosemide (LASIX) 40 MG tablet [id number] Yes Take 1 tablet (40 mg total) by mouth daily. Freada Bergeron, MD Taking Active   glucose blood (ACCU-CHEK GUIDE) test strip [id number] Yes Check sugars three times daily and as needed when feeling ill E11.65, insulin use Ria Bush, MD Taking Active Spouse/Significant Other  insulin degludec (TRESIBA FLEXTOUCH) 200 UNIT/ML FlexTouch Pen [id number] Yes Inject 110 Units into the skin daily. Ria Bush, MD Taking Active   loratadine (CLARITIN) 10 MG tablet [id number] Yes Take 10 mg by mouth daily. [provider] Taking Active Spouse/Significant Other  losartan (COZAAR) 50 MG tablet [id number] Yes TAKE 1 TABLET EVERY DAY Ria Bush, MD Taking Active   lovastatin (MEVACOR) 20 MG tablet [id number] Yes TAKE 1 TABLET AT BEDTIME Ria Bush, MD Taking Active   magnesium oxide (MAG-OX) 400 (240 Mg) MG tablet [id number] Yes TAKE 1 TABLET BY MOUTH TWICE A Velora Heckler, MD Taking Active   metFORMIN (GLUCOPHAGE-XR) 500 MG 24 hr tablet [id number] Yes Take 2 tablets (1,000 mg total) by mouth 2 (two) times daily with a meal. Ria Bush, MD Taking Active   methocarbamol (ROBAXIN) 500 MG tablet [id number] Yes Take 1 tablet (500 mg total) by mouth 3 (three) times daily as needed for muscle spasms (sedation precautions). Ria Bush, MD Taking Active   Multiple Vitamins-Minerals ([facility] SPECTRAVITE PO) [id number] Yes Take 1 tablet by mouth at bedtime. [provider] Taking Active  Spouse/Significant Other  mycophenolate (MYFORTIC) 360 MG TBEC EC tablet [id number] Yes Take 360 mg by mouth 2 (two) times daily. [provider] Taking Active   Omega-3 Fatty Acids (FISH OIL PO) [id number] Yes Take 1 tablet by mouth at bedtime. [provider] Taking Active Spouse/Significant Other  omeprazole (PRILOSEC) 40 MG capsule [id number] Yes TAKE 1 CAPSULE EVERY DAY Ria Bush, MD Taking Active   potassium chloride (KLOR-CON M) 10 MEQ tablet [id number] Yes Take by mouth. [provider] Taking Active   tacrolimus (PROGRAF) 1 MG capsule [id number] Yes Take 1-2 mg by mouth See admin instructions. Take 2 mg by mouth in the morning, then take 1 mg  by mouth in the evening [provider] Taking Active Spouse/Significant Other            Patient Active Problem List   Diagnosis Date Noted   Sinus congestion 06/22/2021   Medicare annual wellness visit, subsequent 02/15/2021   Immunosuppressed status ([place]) 11/10/2020   Chronic right-sided thoracic back pain 04/07/2020   Memory loss 04/07/2020   Prostate cancer ([place]) 02/06/2020   Advanced directives, counseling/discussion 02/06/2020   Pain, dental 08/21/2019   Orthostatic hypotension 07/31/2019   Syncope 07/30/2019   Earlobe lesion, left 02/05/2018   Acute sinusitis 10/23/2017   Chronic heel pain, left 10/14/2017   Anejaculation 08/09/2017   Chronic gout 05/22/2017   Dyspnea 11/18/2016   BPH (  benign prostatic hyperplasia) 10/05/2016   Status post liver transplant ([place]) 03/15/2016   Portal hypertensive gastropathy ([place]) 12/04/2015   Type 2 diabetes mellitus with other specified complication ([place]) 03/47/4259   Anemia    Gynecomastia    CKD (chronic kidney disease) stage 3, GFR 30-59 ml/min (HCC) 11/04/2015   Depression, major, single episode, moderate ([place]) 07/09/2015   Headache 02/19/2015   Insomnia 12/06/2014   Hypomagnesemia    Other fatigue 11/18/2014   Internal hemorrhoid 10/01/2014    Erectile dysfunction 56/38/7564   Alcoholic cirrhosis of liver without ascites ([place]) 07/28/2014   (HFpEF) heart failure with preserved ejection fraction ([place]) 11/01/2013   Essential hypertension 06/04/2013   OSA on CPAP 07/11/2012   History of alcohol dependence ([place]) 07/11/2012   Thrombocytopenia ([place]) 12/15/2011   Morbid obesity with BMI of 40.0-44.9, adult ([place]) 11/16/2011   Routine general medical examination at a health care facility 09/22/2010   NEUROPATHY 05/27/2009   Dyslipidemia associated with type 2 diabetes mellitus ([place]) 02/15/2007   Allergic rhinitis 02/15/2007   GERD 02/15/2007    Immunization History  Administered Date(s) Administered   Hep A / Hep B 01/16/2015, 01/23/2015, 02/06/2015   Hepatitis B, adult 12/04/2015   Influenza Whole 03/24/2010   Influenza,inj,Quad PF,6+ Mos 04/22/2015, 04/06/2017, 04/23/2018, 04/23/2019, 04/07/2020, 06/22/2021, 03/01/2022   PFIZER(Purple Top)SARS-COV-2 Vaccination 11/07/2019, 11/28/2019, 05/07/2020, 12/04/2020   PNEUMOCOCCAL CONJUGATE-20 02/15/2021   Pneumococcal Polysaccharide-23 04/22/2015   Td 05/01/2007   Tdap 12/14/2015, 10/05/2020   Zoster Recombinat (Shingrix) 05/14/2022    Conditions to be addressed/monitored:  Hypertension, Hyperlipidemia, and Diabetes  There are no care plans that you recently modified to display for this patient.     Medication Assistance:  Lantus (Sanofi PAP) Jardiance (PAN foundation) - approved through 07/25/22  Member ID: [id number] Group ID: [id number] RX BIN: [id number] PCN: [id number]  Compliance/Adherence/Medication fill history: Care Gaps: Eye exam due   Star rating drugs: Metformin XR 578m - PDC 100% Losartan 564m- PDC 100% Lovastatin 2057m PDC 100% Jardiance - PDC 100%  Medication Access: Within the past 30 days, how often has patient missed a dose of medication? 0 Is a pillbox or other method used to improve adherence? Yes  Factors that may affect medication adherence?  financial need Are meds synced by current pharmacy? No  Are meds delivered by current pharmacy? Yes  Does patient experience delays in picking up medications due to transportation concerns? No   [facility] Reviewed: Is patient disadvantaged to use [facility]?: Yes  Current Rx insurance plan: [employer] [place] Name and location of [facility]  [id number] [place] [address]: [phone]: [phone] [place]: [phone]: [phone] Pharmacy services reviewed with patient today?: No  Patient requests to transfer care to [facility]?: No  Reason patient declined to change pharmacies: Disadvantaged due to insurance/mail order   Care Plan and Follow Up Patient Decision:  Patient agrees to Care Plan and Follow-up.  Follow Up Plan: Telephone follow up appointment with care management team member scheduled for: 1 month  LindCharlene BrookearmD, BCACP Clinical Pharmacist [facility]-[phone] Barriers:  Unable to achieve control of diabetes  Pharmacist Clinical Goal(s):  Patient will contact provider office for questions/concerns as evidenced notation of same  in electronic health record through collaboration with PharmD and provider.   Interventions: 1:1 collaboration with Ria Bush, MD regarding development and update of comprehensive plan of care as evidenced by provider attestation and co-signature Inter-disciplinary care team collaboration (see longitudinal plan of care) Comprehensive medication review performed; medication list updated in electronic medical record  Hypertension / HFpEF (BP goal <140/90) -Controlled - BP at goal; Denies hypotensive/hypertensive symptoms -Last  ejection fraction: 65-70% (Date: 07/2019) -HF type: Diastolic;  NYHA Class: II (slight limitation of activity) -Current treatment: Losartan 50 mg daily - Appropriate, Effective, Safe, Accessible Furosemide 40 mg daily - Appropriate, Effective, Safe, Accessible -Educated on BP goals and benefits of medications for prevention of heart attack, stroke and kidney damage; -Counseled to monitor BP at home periodically -Recommended to continue current medication   Hyperlipidemia: (LDL goal < 100) -Controlled - LDL 38 (01/2021); but TG elevated above goal secondary to DM. -Current treatment: Lovastatin 20 mg daily HS - Appropriate, Effective, Safe, Accessible -Medications previously tried: none  -Recommended to continue current medication  Diabetes (A1c goal <7%) -Uncontrolled - A1c 8.9% (01/2022) improved from 9.6%; pt has increased metformin up to 2000 mg/day, denies side effects; he brought [place] reader in today for download -Current home glucose readings: -Reviewed AGP report: 04/11/22 to 04/24/22. Sensor active: 73%             Time in range (70-180): 16% (goal > 70%)             High (181-250): 55%             Very high (>250): 29%             Low (< 70): 0% (goal < 4%)             GMI: 8.7%; Average glucose: 224  -Current medications: Jardiance 25 mg daily (PAP) - Appropriate, Query Effective Lantus Solostar 55 units BID-Appropriate, Query Effective Metformin XR 500 mg -2 tab BID -Appropriate, Query Effective Freestyle Libre 2 (reader) - Appropriate, Effective, Safe, Accessible -Medications previously tried: Trulicity (diarrhea, cost), Ozempic (never started, declined PAP), glimepiride (2018-2022) -Pt is taking 0.94 u/kg/day of basal insulin, would consider this over-basalization and further increasing the dose will probably not help with BG; there may be small benefit from switching to a more concentrated insulin [place] Aas U200 or Toujeo U300) but unlikely to achieve DM goals with this  alone. Pt is ok with switching insulin once his current supply of Lantus runs out; -Reviewed next options for DM control: consider restarting glipizide or adding bolus insulin -Counseled extensively on diet - provided with DM diet handout with plate method, nutrition label reading; advised to limit snacks to < 15 g carb -Recommend to change Lantus to [place] 110 units once daily (no need to split dose with concentrated insulin)  Health Maintenance -Vaccine gaps: Covid booster, Shingrix -Prostate cancer Dx 02/2021, completed radiation, now cancer free. Follows with urology. 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 Follows w [facility] (NP [place], Dr Zollie Scale)  Patient Goals/Self-Care Activities Patient will:  - take medications as prescribed as evidenced by patient report and record review focus on medication adherence by routine check glucose twice daily, document, and provide at future appointments engage in dietary modifications by limiting carbs, sugar

## 2022-06-16 ENCOUNTER — Other Ambulatory Visit: Payer: Self-pay | Admitting: Family Medicine

## 2022-06-16 DIAGNOSIS — E1169 Type 2 diabetes mellitus with other specified complication: Secondary | ICD-10-CM

## 2022-06-16 NOTE — Telephone Encounter (Signed)
Too soon. Rx sent on 04/11/22, #90/3 to [facility] mail order pharmacy.

## 2022-06-22 DIAGNOSIS — Z944 Liver transplant status: Secondary | ICD-10-CM | POA: Diagnosis not present

## 2022-06-22 DIAGNOSIS — Z79899 Other long term (current) drug therapy: Secondary | ICD-10-CM | POA: Diagnosis not present

## 2022-06-22 DIAGNOSIS — D84821 Immunodeficiency due to drugs: Secondary | ICD-10-CM | POA: Diagnosis not present

## 2022-06-22 DIAGNOSIS — Z4823 Encounter for aftercare following liver transplant: Secondary | ICD-10-CM | POA: Diagnosis not present

## 2022-07-01 ENCOUNTER — Encounter: Payer: Self-pay | Admitting: Family Medicine

## 2022-07-01 ENCOUNTER — Ambulatory Visit (INDEPENDENT_AMBULATORY_CARE_PROVIDER_SITE_OTHER): Payer: Medicare HMO | Admitting: Family Medicine

## 2022-07-01 VITALS — BP 138/76 | HR 85 | Temp 97.7°F | Ht 66.5 in | Wt 258.0 lb

## 2022-07-01 DIAGNOSIS — Z794 Long term (current) use of insulin: Secondary | ICD-10-CM

## 2022-07-01 DIAGNOSIS — F4321 Adjustment disorder with depressed mood: Secondary | ICD-10-CM

## 2022-07-01 DIAGNOSIS — D849 Immunodeficiency, unspecified: Secondary | ICD-10-CM

## 2022-07-01 DIAGNOSIS — M25552 Pain in left hip: Secondary | ICD-10-CM | POA: Diagnosis not present

## 2022-07-01 DIAGNOSIS — E1169 Type 2 diabetes mellitus with other specified complication: Secondary | ICD-10-CM

## 2022-07-01 DIAGNOSIS — Z6841 Body Mass Index (BMI) 40.0 and over, adult: Secondary | ICD-10-CM

## 2022-07-01 DIAGNOSIS — G8929 Other chronic pain: Secondary | ICD-10-CM

## 2022-07-01 DIAGNOSIS — Z944 Liver transplant status: Secondary | ICD-10-CM | POA: Diagnosis not present

## 2022-07-01 LAB — POCT GLYCOSYLATED HEMOGLOBIN (HGB A1C): Hemoglobin A1C: 7.4 % — AB (ref 4.0–5.6)

## 2022-07-01 NOTE — Patient Instructions (Addendum)
Good to see you today.  Sugars are doing better - continue current regimen.  Foot exam next visit.  1 day at a time.  Return in 3-4 months for diabetes check.

## 2022-07-01 NOTE — Assessment & Plan Note (Addendum)
Obesity complicated by comorbidities of HTN, HLD, DM, OA, GERD, CHF, OSA.

## 2022-07-01 NOTE — Assessment & Plan Note (Signed)
Longstanding issue, anticipate OA related. Offered hip xray today, he prefers to postpone at this time as he's not ready to seek further treatment.

## 2022-07-01 NOTE — Progress Notes (Signed)
Patient ID: Colton Porter, male    DOB: January 22, 1966, 57 y.o.   MRN: [id number]  This visit was conducted in person.  BP 138/76   Pulse 85   Temp 97.7 F (36.5 C) (Temporal)   Ht 5' 6.5" (1.689 m)   Wt 258 lb (117 kg)   SpO2 95%   BMI 41.02 kg/m    CC: 4 mo DM f/u visit  Subjective:   HPI: Colton Porter is a 57 y.o. male presenting on 07/01/2022 for Diabetes (Here for 4 mo f/u.)   Son Vicente Males suddenly died last month. Support provided, offered grief counseling.  Buys compression stockings from [employer] in [place]. Just bought several new pairs.  Ongoing L hip pain and back pain. Longstanding for years. Declines xray at this time.   Known type 2 diabetic, chronic HFpEF, CKD stage 3 and alcoholic cirrhosis s/p liver transplant, on myfortic and prograf. H/o prostate cancer on Eligard depo Q28mo s/p external beam radiation therapy completed 06/2021.   DM - previously saw endo, has not been checking sugars regularly. Compliant with antihyperglycemic regimen which includes: jardiance '25mg'$  daily, tresiba 110u daily (recent change), metformin XR '1000mg'$  bid. Denies low sugars or hypoglycemic symptoms. Denies blurry vision. + foot paresthesias. Last diabetic eye exam 10/2021. Glucometer brand: freestyle libre 2 - has not worn in the past few weeks. Last foot exam: 10/2021. DSME: 08/2017. Has been eating more raw peanuts, otherwise decreased appetite.  Lab Results  Component Value Date   HGBA1C 7.4 (A) 07/01/2022   Diabetic Foot Exam - Simple   No data filed    Lab Results  Component Value Date   MICROALBUR 0.8 02/22/2022         Relevant past medical, surgical, family and social history reviewed and updated as indicated. Interim medical history since our last visit reviewed. Allergies and medications reviewed and updated. Outpatient Medications Prior to Visit  Medication Sig Dispense Refill   Accu-Chek FastClix Lancets MISC USE THREE TIMES DAILY TO CHECK SUGARS E65.11,  INSULIN USE 102 each 12   ACCU-CHEK GUIDE test strip USE AS INSTRUCTED TO CHECK SUGARS THREE TIMES DAILY E11.65, INSULIN USE 100 strip 12   acetaminophen (TYLENOL) 500 MG tablet Take 500 mg by mouth every 6 (six) hours as needed for mild pain.     allopurinol (ZYLOPRIM) 300 MG tablet TAKE 1 TABLET EVERY DAY 90 tablet 3   ASPIR-LOW 81 MG EC tablet Take 1 tablet (81 mg total) by mouth daily. 90 tablet 3   B-D UF III MINI PEN NEEDLES 31G X 5 MM MISC USE TO INJECT INSULIN DAILY 100 each 3   Blood Glucose Monitoring Suppl (ACCU-CHEK GUIDE) w/Device KIT 1 Units by Does not apply route as directed. 1 kit 0   Cholecalciferol (VITAMIN D3) 2000 units TABS Take 1 tablet by mouth at bedtime.     Continuous Blood Gluc Sensor (FREESTYLE LIBRE 2 SENSOR) MISC Apply sensor every 14 days to monitor sugar continously 2 each 5   docusate sodium (COLACE) 100 MG capsule Take 100 mg by mouth daily as needed for mild constipation.      empagliflozin (JARDIANCE) 25 MG TABS tablet Take 1 tablet (25 mg total) by mouth daily before breakfast. 90 tablet 3   escitalopram (LEXAPRO) 10 MG tablet TAKE 1 TABLET EVERY DAY 90 tablet 3   fluticasone (FLONASE) 50 MCG/ACT nasal spray SPRAY 2 SPRAYS INTO EACH NOSTRIL EVERY DAY 48 mL 2   furosemide (LASIX) 40 MG  tablet Take 1 tablet (40 mg total) by mouth daily. 15 tablet 0   glucose blood (ACCU-CHEK GUIDE) test strip Check sugars three times daily and as needed when feeling ill E11.65, insulin use 100 each 11   insulin degludec (TRESIBA FLEXTOUCH) 200 UNIT/ML FlexTouch Pen Inject 110 Units into the skin daily. 54 mL 0   loratadine (CLARITIN) 10 MG tablet Take 10 mg by mouth daily.     losartan (COZAAR) 50 MG tablet TAKE 1 TABLET EVERY DAY 90 tablet 3   lovastatin (MEVACOR) 20 MG tablet TAKE 1 TABLET AT BEDTIME 90 tablet 3   magnesium oxide (MAG-OX) 400 (240 Mg) MG tablet TAKE 1 TABLET BY MOUTH TWICE A DAY 180 tablet 1   metFORMIN (GLUCOPHAGE-XR) 500 MG 24 hr tablet Take 2 tablets  (1,000 mg total) by mouth 2 (two) times daily with a meal. 360 tablet 3   Multiple Vitamins-Minerals ([facility] SPECTRAVITE PO) Take 1 tablet by mouth at bedtime.     mycophenolate (MYFORTIC) 360 MG TBEC EC tablet Take 360 mg by mouth 2 (two) times daily.     Omega-3 Fatty Acids (FISH OIL PO) Take 1 tablet by mouth at bedtime.     omeprazole (PRILOSEC) 40 MG capsule TAKE 1 CAPSULE EVERY DAY 90 capsule 3   potassium chloride (KLOR-CON M) 10 MEQ tablet Take by mouth.     tacrolimus (PROGRAF) 1 MG capsule Take 1-2 mg by mouth See admin instructions. Take 2 mg by mouth in the morning, then take 1 mg  by mouth in the evening     methocarbamol (ROBAXIN) 500 MG tablet Take 1 tablet (500 mg total) by mouth 3 (three) times daily as needed for muscle spasms (sedation precautions). 40 tablet 0   No facility-administered medications prior to visit.     Per HPI unless specifically indicated in ROS section below Review of Systems  Objective:  BP 138/76   Pulse 85   Temp 97.7 F (36.5 C) (Temporal)   Ht 5' 6.5" (1.689 m)   Wt 258 lb (117 kg)   SpO2 95%   BMI 41.02 kg/m   Wt Readings from Last 3 Encounters:  07/01/22 258 lb (117 kg)  06/01/22 257 lb (116.6 kg)  03/01/22 258 lb 4 oz (117.1 kg)      Physical Exam Vitals and nursing note reviewed.  Constitutional:      Appearance: Normal appearance. He is not ill-appearing.  Eyes:     Extraocular Movements: Extraocular movements intact.     Conjunctiva/sclera: Conjunctivae normal.     Pupils: Pupils are equal, round, and reactive to light.  Cardiovascular:     Rate and Rhythm: Normal rate and regular rhythm.     Pulses: Normal pulses.     Heart sounds: Normal heart sounds. No murmur heard. Pulmonary:     Effort: Pulmonary effort is normal. No respiratory distress.     Breath sounds: Normal breath sounds. No wheezing, rhonchi or rales.  Musculoskeletal:     Right lower leg: No edema.     Left lower leg: No edema.     Comments: See HPI for foot  exam if done  Skin:    General: Skin is warm and dry.     Findings: No rash.  Neurological:     Mental Status: He is alert.  Psychiatric:        Mood and Affect: Mood normal.        Behavior: Behavior normal.       Results  for orders placed or performed in visit on 07/01/22  POCT glycosylated hemoglobin (Hb A1C)  Result Value Ref Range   Hemoglobin A1C 7.4 (A) 4.0 - 5.6 %   HbA1c POC (<> result, manual entry)     HbA1c, POC (prediabetic range)     HbA1c, POC (controlled diabetic range)      Assessment & Plan:   Problem List Items Addressed This Visit     Type 2 diabetes mellitus with other specified complication ([place]) - Primary (Chronic)    Chronic, improved control based on latest A1c. Congratulated.  Continue current regimen, reassess at 3-30moDM f/u visit.       Relevant Orders   POCT glycosylated hemoglobin (Hb A1C) (Completed)   Status post liver transplant ([place] (Chronic)   Morbid obesity with BMI of 40.0-44.9, adult ([place]    Obesity complicated by comorbidities of HTN, HLD, DM, OA, GERD, CHF, OSA.       Immunosuppressed status ([place]   Chronic left hip pain    Longstanding issue, anticipate OA related. Offered hip xray today, he prefers to postpone at this time as he's not ready to seek further treatment.       Grief    Support provided. He will let me know if desires grief counseling.         No orders of the defined types were placed in this encounter.  Orders Placed This Encounter  Procedures   POCT glycosylated hemoglobin (Hb A1C)     Patient Instructions  Good to see you today.  Sugars are doing better - continue current regimen.  Foot exam next visit.  1 day at a time.  Return in 3-4 months for diabetes check.   Follow up plan: Return in about 3 months (around 09/30/2022) for follow up visit.  JRia Bush MD

## 2022-07-01 NOTE — Assessment & Plan Note (Signed)
Chronic, improved control based on latest A1c. Congratulated.  Continue current regimen, reassess at 3-42moDM f/u visit.

## 2022-07-01 NOTE — Assessment & Plan Note (Signed)
Support provided. He will let me know if desires grief counseling.

## 2022-07-18 ENCOUNTER — Other Ambulatory Visit: Payer: Self-pay | Admitting: Family Medicine

## 2022-07-18 DIAGNOSIS — E1169 Type 2 diabetes mellitus with other specified complication: Secondary | ICD-10-CM

## 2022-08-17 ENCOUNTER — Other Ambulatory Visit: Payer: Self-pay

## 2022-08-17 MED ORDER — FUROSEMIDE 40 MG PO TABS: 40.0000 mg | ORAL_TABLET | Freq: Every day | ORAL | 0 refills | Status: DC

## 2022-08-21 ENCOUNTER — Other Ambulatory Visit: Payer: Self-pay | Admitting: Family Medicine

## 2022-08-21 DIAGNOSIS — E1169 Type 2 diabetes mellitus with other specified complication: Secondary | ICD-10-CM

## 2022-08-22 ENCOUNTER — Other Ambulatory Visit: Payer: Self-pay | Admitting: Family Medicine

## 2022-08-22 DIAGNOSIS — Z794 Long term (current) use of insulin: Secondary | ICD-10-CM

## 2022-08-22 NOTE — Telephone Encounter (Signed)
Duplicate request. Refill sent to Dr. Darnell Level to authorize. (See 08/21/22 refill note.)  Request denied.

## 2022-08-22 NOTE — Telephone Encounter (Signed)
Colton Porter Last filled:  04/30/22, #54 mL Last OV:  07/01/22, 4 mo DM f/u Next OV:  10/28/22, 3 mo DM f/u

## 2022-08-23 NOTE — Telephone Encounter (Signed)
ERx 

## 2022-09-02 DIAGNOSIS — E1169 Type 2 diabetes mellitus with other specified complication: Secondary | ICD-10-CM | POA: Diagnosis not present

## 2022-09-13 ENCOUNTER — Telehealth: Payer: Self-pay

## 2022-09-13 ENCOUNTER — Telehealth: Payer: Self-pay | Admitting: Cardiology

## 2022-09-13 MED ORDER — FUROSEMIDE 40 MG PO TABS: 40.0000 mg | ORAL_TABLET | Freq: Every day | ORAL | 0 refills | Status: DC

## 2022-09-13 NOTE — Telephone Encounter (Signed)
*  STAT* If patient is at the pharmacy, call can be transferred to refill team.   1. Which medications need to be refilled? (please list name of each medication and dose if known) furosemide (LASIX) 40 MG tablet   2. Which pharmacy/location (including street and city if local pharmacy) is medication to be sent to?  [id number] [place] [place] [facility] [address]    3. Do they need a 30 day or 90 day supply? 91  Patient has appt 3/27

## 2022-09-13 NOTE — Telephone Encounter (Signed)
Pt rtn call. I asked about order received for CGM. Pt states he is aware of order. Notified him we'll complete it and fax it in. Pt expresses his thanks.   Placed order in Dr. Synthia Innocent box.

## 2022-09-13 NOTE — Telephone Encounter (Signed)
Received faxed order for continuous glucose meter (CGM) from [facility].   Lvm asking pt to call back. Need to know if pt is aware and/or initiated order.

## 2022-09-13 NOTE — Telephone Encounter (Signed)
Pt's medication was sent to pt's pharmacy as requested. Confirmation received.  °

## 2022-09-14 NOTE — Progress Notes (Signed)
Cardiology Office Note:    Date:  09/21/2022   ID:  EMITT BURGET, DOB 28-Jun-1965, MRN [id number]  PCP:  Ria Bush, [place] Providers Cardiologist:  Freada Bergeron, MD     Referring MD: Ria Bush, MD   Chief Complaint:  Follow-up     History of Present Illness:   KAILIN VUU is a 57 y.o. male with with a hx of hyperlipidemia, obesity, aortic atherosclerosis, OSA on CPAP, hypertension, chronic diastolic dysfunction, chronic thrombocytopenia, significant EtOH history with subsequent alcoholic cirrhosis and liver transplant July 2017.       He had prior left and right heart cardiac catheterization December 2014 with normal coronaries and elevated right-sided pressures in the setting of diastolic dysfunction. Transthoracic echocardiogram 05/2016 normal LVEF 55 to 60% with grade 2 diastolic dysfunction, no R WMA. Myoview 2018 with normal LVEF no ischemia or prior infarct. TTE 07/31/2019 LVEF 65 to [id number], grade 1 diastolic dysfunction, no significant valvular disease.  Patient was last seen in our office 03/2021 for clearance for gold seed placement for prostate CA.  Patient comes in for regular f/u. Some DOE that is chronic. He lost his son in December and having trouble dealing with it. I offered to refer him to a counselor but he declined. He does some yard work but no regular exercise. Has hip issues. No      Past Medical History:  Diagnosis Date   Alcohol dependence ([place]) [id number]   Alcoholic cirrhosis of liver with ascites ([place]) 07/2014   s./p transplant 12/2015   Allergy    Anemia    Cellulitis of left leg    Chronic diastolic heart failure ([place]) 11/01/2013   CKD (chronic kidney disease) stage 3, GFR 30-59 ml/min ([place]) 11/04/2015   Diabetes mellitus without complication (HCC)    Type II   GERD (gastroesophageal reflux disease)    Hyperlipidemia    Hypertension    Left rib fracture 07/27/2019   Neuropathy    OSA on CPAP 07/11/2012   HST  07/2013:  AHI 39/hr.     Pneumonia due to COVID-19 virus 06/2019   Thrombocytopenia ([place]) 12/15/2011   Current Medications: Current Meds  Medication Sig   Accu-Chek FastClix Lancets MISC USE THREE TIMES DAILY TO CHECK SUGARS E65.11, INSULIN USE   ACCU-CHEK GUIDE test strip USE AS INSTRUCTED TO CHECK SUGARS THREE TIMES DAILY E11.65, INSULIN USE   acetaminophen (TYLENOL) 500 MG tablet Take 500 mg by mouth every 6 (six) hours as needed for mild pain.   allopurinol (ZYLOPRIM) 300 MG tablet TAKE 1 TABLET EVERY DAY   ASPIR-LOW 81 MG EC tablet Take 1 tablet (81 mg total) by mouth daily.   B-D UF III MINI PEN NEEDLES 31G X 5 MM MISC USE TO INJECT INSULIN DAILY   Blood Glucose Monitoring Suppl (ACCU-CHEK GUIDE) w/Device KIT 1 Units by Does not apply route as directed.   Cholecalciferol (VITAMIN D3) 2000 units TABS Take 1 tablet by mouth at bedtime.   Continuous Blood Gluc Sensor (FREESTYLE LIBRE 2 SENSOR) MISC Apply sensor every 14 days to monitor sugar continously   docusate sodium (COLACE) 100 MG capsule Take 100 mg by mouth daily as needed for mild constipation.    empagliflozin (JARDIANCE) 25 MG TABS tablet TAKE 1 TABLET BY MOUTH DAILY BEFORE BREAKFAST.   escitalopram (LEXAPRO) 10 MG tablet TAKE 1 TABLET EVERY DAY   fluticasone (FLONASE) 50 MCG/ACT nasal spray SPRAY 2 SPRAYS INTO EACH NOSTRIL EVERY DAY  glucose blood (ACCU-CHEK GUIDE) test strip Check sugars three times daily and as needed when feeling ill E11.65, insulin use   insulin degludec (TRESIBA FLEXTOUCH) 200 UNIT/ML FlexTouch Pen INJECT 110 UNITS INTO THE SKIN DAILY.   loratadine (CLARITIN) 10 MG tablet Take 10 mg by mouth daily.   losartan (COZAAR) 50 MG tablet TAKE 1 TABLET EVERY DAY   lovastatin (MEVACOR) 20 MG tablet TAKE 1 TABLET AT BEDTIME   magnesium oxide (MAG-OX) 400 (240 Mg) MG tablet TAKE 1 TABLET BY MOUTH TWICE A DAY   metFORMIN (GLUCOPHAGE-XR) 500 MG 24 hr tablet Take 2 tablets (1,000 mg total) by mouth 2 (two) times daily  with a meal.   Multiple Vitamins-Minerals ([facility] SPECTRAVITE PO) Take 1 tablet by mouth at bedtime.   mycophenolate (MYFORTIC) 360 MG TBEC EC tablet Take 360 mg by mouth 2 (two) times daily.   Omega-3 Fatty Acids (FISH OIL PO) Take 1 tablet by mouth at bedtime.   omeprazole (PRILOSEC) 40 MG capsule TAKE 1 CAPSULE EVERY DAY   SHINGRIX injection    tacrolimus (PROGRAF) 1 MG capsule Take 1-2 mg by mouth See admin instructions. Take 2 mg by mouth in the morning, then take 1 mg  by mouth in the evening   [DISCONTINUED] furosemide (LASIX) 40 MG tablet Take 1 tablet (40 mg total) by mouth daily.   [DISCONTINUED] potassium chloride (KLOR-CON M) 10 MEQ tablet Take by mouth.    Allergies:   Tolmetin, Ambien [zolpidem tartrate], Morphine and related, Trulicity [dulaglutide], Hydrochlorothiazide w-triamterene, and Lisinopril   Social History   Tobacco Use   Smoking status: Never   Smokeless tobacco: Former    Types: Nurse, children's Use: Never used  Substance Use Topics   Alcohol use: No    Alcohol/week: 0.0 standard drinks of alcohol    Comment: 4-6 drinks daily - NO ETOH SINCE APRIL 2016   Drug use: Not Currently    Comment: remote use of marijuana in the past, has since quit    Family Hx: The patient's family history includes Emphysema in his father and mother; Heart disease in his father; Hypertension in his father; Lung cancer in his maternal aunt; Stroke in his mother and paternal grandmother. There is no history of Heart attack or Diabetes.  ROS     Physical Exam:    VS:  BP 124/78   Pulse 81   Ht 5' 6.5" (1.689 m)   Wt 256 lb (116.1 kg)   SpO2 97%   BMI 40.70 kg/m     Wt Readings from Last 3 Encounters:  09/21/22 256 lb (116.1 kg)  07/01/22 258 lb (117 kg)  06/01/22 257 lb (116.6 kg)    Physical Exam  GEN: Obese, in no acute distress  Neck: no JVD, carotid bruits, or masses Cardiac:RRR; no murmurs, rubs, or gallops  Respiratory:  clear to auscultation  bilaterally, normal work of breathing GI: soft, nontender, nondistended, + BS Ext: without cyanosis, clubbing, or edema, Good distal pulses bilaterally Neuro:  Alert and Oriented x 3,  Psych: euthymic mood, full affect        EKGs/Labs/Other Test Reviewed:    EKG:  EKG is   ordered today.  The ekg ordered today demonstrates NSR, low voltage,   Recent Labs: 02/22/2022: ALT 21; BUN 15; Creatinine, Ser 1.20; Hemoglobin 12.0; Magnesium 1.7; Platelets 131.0; Potassium 4.0; Sodium 141   Recent Lipid Panel Recent Labs    02/22/22 0816  CHOL 102  TRIG 543.0  Triglyceride is over 400; calculations on Lipids are invalid.*  HDL 28.20*  LDLDIRECT 33.0     Prior CV Studies:     TTE 07/2019: IMPRESSIONS   1. Left ventricular ejection fraction, by visual estimation, is 65 to  70%. The left ventricle has hyperdynamic function. There is moderately  increased left ventricular hypertrophy.   2. Left ventricular diastolic parameters are consistent with Grade I  diastolic dysfunction (impaired relaxation).   3. The left ventricle has no regional wall motion abnormalities.   4. Global right ventricle has normal systolic function.The right  ventricular size is normal. Mildly increased right ventricular wall  thickness.   5. Left atrial size was normal.   6. Right atrial size was normal.   7. The mitral valve is grossly normal. No evidence of mitral valve  regurgitation.   8. The tricuspid valve is grossly normal.   9. The tricuspid valve is grossly normal. Tricuspid valve regurgitation  is trivial.  10. The aortic valve is grossly normal. Aortic valve regurgitation is not  visualized.  11. The pulmonic valve was grossly normal. Pulmonic valve regurgitation is  not visualized.  12. The interatrial septum was not well visualized.  13. Very poor acoustic windows due to patient's body habitus. LV and RV  function are grossly normal. No obvious valvular abnormalities noted.    Myoview 2018:    Nuclear stress EF: 64%. Normal wall motion There was no ST segment deviation noted during stress. No rest images obtained. Stress images do not show any perfusion defects. Based upon stress imaging, stress test is low risk no perfusion defects, normal ejection fraction  Risk Assessment/Calculations/Metrics:              ASSESSMENT & PLAN:   No problem-specific Assessment & Plan notes found for this encounter.    Diastolic heart failure - euvolemic on exam. Continue compression socks as they are controlling edema. Continue GDMT includes Lasix, Jardiance.    Hypertension - BP well controlled. Continue losartan    Alcoholic cirrhosis s/p liver transplant 2017 -follows with a specialist in [place].   DM2 on insulin - 03/22/21 A1c  7.6. Not at goal. Follows with endocrinology and PCP.    Obesity - Weight loss via diet and exercise encouraged. Discussed the impact being overweight would have on cardiovascular risk.    OSA - Wears CPAP regularly. CPAP compliance encouraged.    Hyperlipidemia - 02/08/22 total cholesterol 102, HDL 28, LDL 33, triglycerides >500.  Increase controlled diabetes will lead to improved triglyceride control.  Continue lovastatin 20 mg daily.  Future considerations include the addition of Vascepa.   Aortic atherosclerosis - Noted by CT abd 06/2019. No anginal symptoms. Heart healthy diet and regular cardiovascular exercise encouraged.  Continue statin.              Dispo:  No follow-ups on file.   Medication Adjustments/Labs and Tests Ordered: Current medicines are reviewed at length with the patient today.  Concerns regarding medicines are outlined above.  Tests Ordered: Orders Placed This Encounter  Procedures   EKG 12-Lead   Medication Changes: Meds ordered this encounter  Medications   furosemide (LASIX) 40 MG tablet    Sig: Take 1 tablet (40 mg total) by mouth daily.    Dispense:  90 tablet    Refill:  3   potassium chloride (KLOR-CON M) 10 MEQ  tablet    Sig: Take 1 tablet (10 mEq total) by mouth daily.    Dispense:  90 tablet    Refill:  3   Signed, Ermalinda Barrios, PA-C  09/21/2022 8:22 AM    [address], [place],   [address] Phone: [phone]; Fax: [phone]

## 2022-09-15 NOTE — Telephone Encounter (Signed)
Noted  

## 2022-09-15 NOTE — Telephone Encounter (Signed)
Signed and will be placed in Lisa's box tomorrow.

## 2022-09-16 ENCOUNTER — Telehealth: Payer: Self-pay | Admitting: Family Medicine

## 2022-09-16 NOTE — Telephone Encounter (Signed)
(  See 09/16/22 phn note.) Received the following message: Almyra Free from [facility] called in stating that the referral sent out for patient was sent to the wrong fax for [facility]. It was sent to her personal fax. The correct fax number where it was suppose to be faxed is [phone].   Tried faxing orders again to [phone].

## 2022-09-16 NOTE — Telephone Encounter (Signed)
Note. See 09/13/22 phn note.

## 2022-09-16 NOTE — Telephone Encounter (Signed)
Faxed orders to [facility] at [phone].

## 2022-09-16 NOTE — Telephone Encounter (Signed)
Received 2 failed faxes.  Called [facility] to get a different fax #. Given [phone].  Faxed forms to new fax #, which went through.

## 2022-09-16 NOTE — Telephone Encounter (Signed)
Almyra Free from [facility] called in stating that the referral sent out for patient was sent to the wrong fax for [facility]. It was sent to her personal fax. The correct fax number where it was suppose to be faxed is [phone].

## 2022-09-16 NOTE — Telephone Encounter (Signed)
I am not sure what this is pertaining to, there are no referrals in Epic for this patient.

## 2022-09-19 DIAGNOSIS — Z79899 Other long term (current) drug therapy: Secondary | ICD-10-CM | POA: Diagnosis not present

## 2022-09-19 DIAGNOSIS — Z944 Liver transplant status: Secondary | ICD-10-CM | POA: Diagnosis not present

## 2022-09-19 DIAGNOSIS — Z4823 Encounter for aftercare following liver transplant: Secondary | ICD-10-CM | POA: Diagnosis not present

## 2022-09-19 DIAGNOSIS — D84821 Immunodeficiency due to drugs: Secondary | ICD-10-CM | POA: Diagnosis not present

## 2022-09-19 NOTE — Telephone Encounter (Signed)
Colton Porter at [employer]  called in with new fax # [phone] . Any question # [phone]

## 2022-09-19 NOTE — Telephone Encounter (Addendum)
Noted. Faxed order to new fax # [phone].   Received confirmation fax received.

## 2022-09-19 NOTE — Telephone Encounter (Addendum)
Still received 'Fax Failed' at fax # [phone].  Spoke with Arlyn Dunning at [facility] the message from Almyra Free, informed her of message above and asked again for an alternate fax #. She states [phone] is their main fax # and usually works. She is going to check with Almyra Free to see if she has pt's faxed info. If not, Arlyn Dunning will call back to have [name] try to fax order again.   [Placed order in basket on Lisa's desk.]

## 2022-09-21 ENCOUNTER — Ambulatory Visit: Payer: Medicare HMO | Attending: Physician Assistant | Admitting: Physician Assistant

## 2022-09-21 ENCOUNTER — Encounter: Payer: Self-pay | Admitting: Physician Assistant

## 2022-09-21 VITALS — BP 124/78 | HR 81 | Ht 66.5 in | Wt 256.0 lb

## 2022-09-21 DIAGNOSIS — I5032 Chronic diastolic (congestive) heart failure: Secondary | ICD-10-CM

## 2022-09-21 DIAGNOSIS — E785 Hyperlipidemia, unspecified: Secondary | ICD-10-CM | POA: Diagnosis not present

## 2022-09-21 DIAGNOSIS — I700 Atherosclerosis of aorta: Secondary | ICD-10-CM

## 2022-09-21 DIAGNOSIS — E1169 Type 2 diabetes mellitus with other specified complication: Secondary | ICD-10-CM | POA: Diagnosis not present

## 2022-09-21 DIAGNOSIS — Z944 Liver transplant status: Secondary | ICD-10-CM

## 2022-09-21 DIAGNOSIS — Z794 Long term (current) use of insulin: Secondary | ICD-10-CM | POA: Diagnosis not present

## 2022-09-21 DIAGNOSIS — I10 Essential (primary) hypertension: Secondary | ICD-10-CM

## 2022-09-21 MED ORDER — FUROSEMIDE 40 MG PO TABS: 40.0000 mg | ORAL_TABLET | Freq: Every day | ORAL | 3 refills | Status: DC

## 2022-09-21 MED ORDER — POTASSIUM CHLORIDE CRYS ER 10 MEQ PO TBCR: 10.0000 meq | EXTENDED_RELEASE_TABLET | Freq: Every day | ORAL | 3 refills | Status: DC

## 2022-09-21 NOTE — Patient Instructions (Addendum)
Medication Instructions:  Your physician recommends that you continue on your current medications as directed. Please refer to the Current Medication list given to you today.  *If you need a refill on your cardiac medications before your next appointment, please call your pharmacy*   Lab Work: None ordered   If you have labs (blood work) drawn today and your tests are completely normal, you will receive your results only by: [place] (if you have MyChart) OR A paper copy in the mail If you have any lab test that is abnormal or we need to change your treatment, we will call you to review the results.   Testing/Procedures: None ordered    Follow-Up: At [facility], you and your health needs are our priority.  As part of our continuing mission to provide you with exceptional heart care, we have created designated Provider Care Teams.  These Care Teams include your primary Cardiologist (physician) and Advanced Practice Providers (APPs -  Physician Assistants and Nurse Practitioners) who all work together to provide you with the care you need, when you need it.  We recommend signing up for the patient portal called "MyChart".  Sign up information is provided on this After Visit Summary.  MyChart is used to connect with patients for Virtual Visits (Telemedicine).  Patients are able to view lab/test results, encounter notes, upcoming appointments, etc.  Non-urgent messages can be sent to your provider as well.   To learn more about what you can do with MyChart, go to [email or web address].    Your next appointment:   12 month(s)  Provider:   Freada Bergeron, MD     Other Instructions Your physician recommends that you get 150 minutes of exercise a week

## 2022-10-15 ENCOUNTER — Other Ambulatory Visit: Payer: Self-pay | Admitting: Family Medicine

## 2022-10-17 NOTE — Telephone Encounter (Signed)
Please call patient and schedule a diabetes follow-up which is due.

## 2022-10-17 NOTE — Telephone Encounter (Signed)
Patient has been scheduled

## 2022-10-17 NOTE — Telephone Encounter (Signed)
Noted Will need to be seen for further refills. 

## 2022-10-20 ENCOUNTER — Other Ambulatory Visit: Payer: Self-pay | Admitting: Family Medicine

## 2022-10-20 DIAGNOSIS — Z794 Long term (current) use of insulin: Secondary | ICD-10-CM

## 2022-10-21 ENCOUNTER — Telehealth: Payer: Self-pay | Admitting: Pharmacist

## 2022-10-21 DIAGNOSIS — I5032 Chronic diastolic (congestive) heart failure: Secondary | ICD-10-CM

## 2022-10-21 DIAGNOSIS — E1169 Type 2 diabetes mellitus with other specified complication: Secondary | ICD-10-CM

## 2022-10-21 NOTE — Telephone Encounter (Signed)
Received message from patient's wife Bjorn Loser. She needs help renewing Jardiance. Patient was enrolled in [employer] for [place], which expired 07/25/22. Unclear if patient re-enrolled himself since then. Right now the [employer] foundation grant for [place] is closed so he will not be able to re-enroll at this point.  Fortunately, he can either enroll in [facility] Cardiomyopathy grand for Jardiance or apply for Jardiance PAP directly through manufacturer.  Contacted pt wife to discuss options, no answer, left VM to return call.

## 2022-10-25 ENCOUNTER — Other Ambulatory Visit: Payer: Medicare HMO

## 2022-10-26 ENCOUNTER — Ambulatory Visit: Payer: Medicare HMO | Admitting: Urology

## 2022-10-26 ENCOUNTER — Other Ambulatory Visit: Payer: Medicare HMO

## 2022-10-26 DIAGNOSIS — C61 Malignant neoplasm of prostate: Secondary | ICD-10-CM | POA: Diagnosis not present

## 2022-10-27 LAB — PSA: Prostate Specific Ag, Serum: <0.1 ng/mL (ref 0.0–4.0)

## 2022-10-28 ENCOUNTER — Ambulatory Visit: Payer: Medicare HMO | Admitting: Family Medicine

## 2022-11-01 ENCOUNTER — Encounter: Payer: Self-pay | Admitting: Urology

## 2022-11-01 ENCOUNTER — Ambulatory Visit: Payer: Medicare HMO | Admitting: Urology

## 2022-11-01 VITALS — BP 129/76 | HR 96 | Ht 66.5 in | Wt 276.0 lb

## 2022-11-01 DIAGNOSIS — R972 Elevated prostate specific antigen [PSA]: Secondary | ICD-10-CM

## 2022-11-01 DIAGNOSIS — Z8546 Personal history of malignant neoplasm of prostate: Secondary | ICD-10-CM | POA: Diagnosis not present

## 2022-11-01 DIAGNOSIS — N529 Male erectile dysfunction, unspecified: Secondary | ICD-10-CM

## 2022-11-01 DIAGNOSIS — C61 Malignant neoplasm of prostate: Secondary | ICD-10-CM

## 2022-11-01 NOTE — Progress Notes (Signed)
I, Amy L Pierron, acting as a scribe for Vanna Scotland, MD.,have documented all relevant documentation on the behalf of Vanna Scotland, MD,as directed by  Vanna Scotland, MD while in the presence of Vanna Scotland, MD.  11/01/2022 12:24 PM   Colton Porter [birth year] [id number]  Referring provider: Eustaquio Boyden, MD [address] [place],  [place] [address]  Chief Complaint  Patient presents with   Prostate Cancer    Follow up    HPI: 57 year-old male with a personal history of prostate cancer returns toay for a routine annual follow-up.   His prostate biopsy surgical pathology showed Gleason 3+3 and Gleason 3+4 as below, R >L involvement.   He underwent prostate gold seed marker placement on 04/28/2021.   His most recent PSA on 10/26/22 was undetectable.   On occasion he has a few drops after leakage after urinating but isn't bothered by it. He reports not being able to obtain a full erection and has never tried anything for it. Sadly, his son passed away 03-Jul-2022.    PMH: Past Medical History:  Diagnosis Date   Alcohol dependence (HCC) 07/11/2012   Alcoholic cirrhosis of liver with ascites (HCC) 07/2014   s./p transplant 12/2015   Allergy    Anemia    Cellulitis of left leg    Chronic diastolic heart failure (HCC) 11/01/2013   CKD (chronic kidney disease) stage 3, GFR 30-59 ml/min (HCC) 11/04/2015   Diabetes mellitus without complication (HCC)    Type II   GERD (gastroesophageal reflux disease)    Hyperlipidemia    Hypertension    Left rib fracture 07/27/2019   Neuropathy    OSA on CPAP 07/11/2012   HST 07/2013:  AHI 39/hr.     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  Irrigation debridement of right thumb open distal phalanx fracture 2.  Repair of right thumb skin and nailbed laceration ;  Surgeon: Betha Loa, MD;  Location: [facility] OR;  Service: Orthopedics;  Laterality: Right;   LEFT AND RIGHT HEART CATHETERIZATION WITH CORONARY ANGIOGRAM N/A 06/10/2013   Procedure: LEFT AND RIGHT HEART CATHETERIZATION WITH CORONARY ANGIOGRAM;  Surgeon: Micheline Chapman, MD;  Location: [facility] CATH LAB;  Service: Cardiovascular;  Laterality: N/A;   LIVER TRANSPLANTATION  12/2015   alcoholic cirrhosis ([facility] at [facility])    Home Medications:  Allergies as of 11/01/2022       Reactions   Tolmetin Rash   cirrhosis   Ambien [zolpidem Tartrate] Other (See Comments)   Over-toxicity from liver failure   Morphine And Related    Hallucinations.   Trulicity [dulaglutide]    frequency   Hydrochlorothiazide W-triamterene Other (See Comments)   REACTION: dizzy, nausea   Lisinopril Other (See Comments)   REACTION: cough, decreased libido        Medication List        Accurate as of Nov 01, 2022 12:24 PM. If you have any questions, ask your nurse or doctor.          STOP taking these medications    Jardiance 25 MG Tabs tablet Generic  drug: empagliflozin Stopped by: Vanna Scotland, MD       TAKE these medications    Accu-Chek FastClix Lancets Misc USE THREE TIMES DAILY TO CHECK SUGARS E65.11, INSULIN USE   Accu-Chek Guide w/Device Kit 1 Units by Does not apply route as directed.   acetaminophen 500 MG tablet Commonly known as: TYLENOL Take 500 mg by mouth every 6 (six) hours as needed for mild pain.   allopurinol 300 MG tablet Commonly known as: ZYLOPRIM TAKE 1 TABLET EVERY DAY   Aspir-Low 81 MG tablet Generic drug: aspirin EC Take 1 tablet (81 mg total) by mouth daily.   B-D UF III MINI PEN NEEDLES 31G X 5 MM Misc Generic drug: Insulin Pen Needle USE TO INJECT INSULIN  DAILY   [employer]S SPECTRAVITE PO Take 1 tablet by mouth at bedtime.   docusate sodium 100 MG capsule Commonly known as: COLACE Take 100 mg by mouth daily as needed for mild constipation.   escitalopram 10 MG tablet Commonly known as: LEXAPRO TAKE 1 TABLET EVERY DAY   FISH OIL PO Take 1 tablet by mouth at bedtime.   fluticasone 50 MCG/ACT nasal spray Commonly known as: FLONASE SPRAY 2 SPRAYS INTO EACH NOSTRIL EVERY DAY   FreeStyle Libre 2 Sensor Misc Apply sensor every 14 days to monitor sugar continously   furosemide 40 MG tablet Commonly known as: LASIX Take 1 tablet (40 mg total) by mouth daily.   glucose blood test strip Commonly known as: Accu-Chek Guide Check sugars three times daily and as needed when feeling ill E11.65, insulin use   Accu-Chek Guide test strip Generic drug: glucose blood USE AS INSTRUCTED TO CHECK SUGARS THREE TIMES DAILY E11.65, INSULIN USE   loratadine 10 MG tablet Commonly known as: CLARITIN Take 10 mg by mouth daily.   losartan 50 MG tablet Commonly known as: COZAAR TAKE 1 TABLET EVERY DAY   lovastatin 20 MG tablet Commonly known as: MEVACOR TAKE 1 TABLET AT BEDTIME   magnesium oxide 400 (240 Mg) MG tablet Commonly known as: MAG-OX TAKE 1 TABLET BY MOUTH TWICE A DAY   metFORMIN 500 MG 24 hr tablet Commonly known as: GLUCOPHAGE-XR Take 2 tablets (1,000 mg total) by mouth 2 (two) times daily with a meal.   mycophenolate 360 MG Tbec EC tablet Commonly known as: MYFORTIC Take 360 mg by mouth 2 (two) times daily.   omeprazole 40 MG capsule Commonly known as: PRILOSEC TAKE 1 CAPSULE EVERY DAY   potassium chloride 10 MEQ tablet Commonly known as: KLOR-CON M Take 1 tablet (10 mEq total) by mouth daily.   Shingrix injection Generic drug: Zoster Vaccine Adjuvanted   tacrolimus 1 MG capsule Commonly known as: PROGRAF Take 1-2 mg by mouth See admin instructions. Take 2 mg by mouth in the morning, then take 1 mg  by mouth in the  evening   Tresiba FlexTouch 200 UNIT/ML FlexTouch Pen Generic drug: insulin degludec INJECT 110 UNITS INTO THE SKIN DAILY.   Vitamin D3 50 MCG (2000 UT) Tabs Take 1 tablet by mouth at bedtime.        Allergies:  Allergies  Allergen Reactions   Tolmetin Rash    cirrhosis   Ambien [Zolpidem Tartrate] Other (See Comments)    Over-toxicity from liver failure   Morphine And Related     Hallucinations.   Trulicity [Dulaglutide]     frequency   Hydrochlorothiazide W-Triamterene Other (See Comments)    REACTION: dizzy, nausea   Lisinopril Other (See Comments)  REACTION: cough, decreased libido    Family History: Family History  Problem Relation Age of Onset   Stroke Mother    Emphysema Mother    Hypertension Father    Heart disease Father    Emphysema Father    Lung cancer Maternal Aunt    Stroke Paternal Grandmother    Heart attack Neg Hx    Diabetes Neg Hx     Social History:  reports that he has never smoked. He has quit using smokeless tobacco.  His smokeless tobacco use included chew. He reports that he does not currently use drugs. He reports that he does not drink alcohol.   Physical Exam: BP 129/76   Pulse 96   Ht 5' 6.5" (1.689 m)   Wt 276 lb (125.2 kg)   BMI 43.88 kg/m   Constitutional:  Alert and oriented, No acute distress. HEENT: [place] AT, moist mucus membranes.  Trachea midline, no masses. Neurologic: Grossly intact, no focal deficits, moving all 4 extremities. Psychiatric: Normal mood and affect.   Assessment & Plan:    History of prostate cancer  - PSA remains undetectable. Continue to follow up annually.  Erectile Dysfunction  - We discussed the pathophysiology of erectile dysfunction today along with possible contributing factors. Discussed possible treatment options including PDE 5 inhibitors. We discussed contraindications for this medication as well as common side effects. Patient was counseled on optimal use. All of his questions were  answered in detail. He will let us know if he wants to proceed with having Sildenafil prescribed.  I have reviewed the above documentation for accuracy and completeness, and I agree with the above.   Vanna Scotland, MD   Return in about 1 year (around 11/01/2023) for PSA.   [facility] [address] [place], [place] [address] [phone]

## 2022-11-23 ENCOUNTER — Encounter: Payer: Self-pay | Admitting: Family Medicine

## 2022-11-23 ENCOUNTER — Ambulatory Visit (INDEPENDENT_AMBULATORY_CARE_PROVIDER_SITE_OTHER): Payer: Medicare HMO | Admitting: Family Medicine

## 2022-11-23 VITALS — BP 134/80 | HR 86 | Temp 97.2°F | Ht 66.5 in | Wt 252.4 lb

## 2022-11-23 DIAGNOSIS — E1169 Type 2 diabetes mellitus with other specified complication: Secondary | ICD-10-CM | POA: Diagnosis not present

## 2022-11-23 DIAGNOSIS — Z6841 Body Mass Index (BMI) 40.0 and over, adult: Secondary | ICD-10-CM | POA: Diagnosis not present

## 2022-11-23 DIAGNOSIS — D849 Immunodeficiency, unspecified: Secondary | ICD-10-CM | POA: Diagnosis not present

## 2022-11-23 DIAGNOSIS — F4321 Adjustment disorder with depressed mood: Secondary | ICD-10-CM | POA: Diagnosis not present

## 2022-11-23 DIAGNOSIS — Z794 Long term (current) use of insulin: Secondary | ICD-10-CM

## 2022-11-23 DIAGNOSIS — I5032 Chronic diastolic (congestive) heart failure: Secondary | ICD-10-CM | POA: Diagnosis not present

## 2022-11-23 LAB — POCT GLYCOSYLATED HEMOGLOBIN (HGB A1C): Hemoglobin A1C: 7.5 % — AB (ref 4.0–5.6)

## 2022-11-23 NOTE — Assessment & Plan Note (Signed)
Stable period, seems euvolemic. 

## 2022-11-23 NOTE — Progress Notes (Signed)
Ph: [phone] Fax: [phone]   Patient ID: Colton Porter, male    DOB: 1965/09/28, 57 y.o.   MRN: [id number]  This visit was conducted in person.  BP 134/80   Pulse 86   Temp (!) 97.2 F (36.2 C) (Temporal)   Ht 5' 6.5" (1.689 m)   Wt 252 lb 6 oz (114.5 kg)   SpO2 96%   BMI 40.12 kg/m    CC: 4 mo DM f/u visit  Subjective:   HPI: Colton Porter is a 57 y.o. male presenting on 11/23/2022 for Medical Management of Chronic Issues (Here for 4 mo DM f/u.)   Known type 2 diabetic, chronic HFpEF, CKD stage 3 and alcoholic cirrhosis s/p liver transplant on myfortic and prograf. H/o prostate cancer on Eligard depo Q84mo, s/p external beam radiation therapy completed 06/2021.   Difficult time after son Colton Porter died 06/22/2022.  Insomnia - melatonin didn't help. Doesn't want more pills. Doesn't like books, has tried sleep apps without benefit.   Compression stockings from [facility] [facility] in [place].   Ongoing L>R hip pain as well as lumbar back pain. Declines further eval at this time.   DM - previously followed by endo. Does not regularly check sugars. Compliant with antihyperglycemic regimen which includes: tresiba 110u daily, metformin XR 1000mg  bid. Ran out of jardiance earlier this year - in process of searching for PAP to renew. Denies low sugars or hypoglycemic symptoms. Denies paresthesias, blurry vision. Last diabetic eye exam 10/2021. Glucometer brand: freestyle libre 2 - hasn't been using recently. Last foot exam: 10/2021. DSME: 08/2017.  Lab Results  Component Value Date   HGBA1C 7.5 (A) 11/23/2022   Diabetic Foot Exam - Simple   Simple Foot Form Diabetic Foot exam was performed with the following findings: Yes 11/23/2022  9:00 AM  Visual Inspection No deformities, no ulcerations, no other skin breakdown bilaterally: Yes Sensation Testing Intact to touch and monofilament testing bilaterally: Yes Pulse Check Posterior Tibialis and Dorsalis pulse intact bilaterally:  Yes Comments Mild claudication symptoms endorsed, but with good pulses    Lab Results  Component Value Date   MICROALBUR 0.8 02/22/2022       Relevant past medical, surgical, family and social history reviewed and updated as indicated. Interim medical history since our last visit reviewed. Allergies and medications reviewed and updated. Outpatient Medications Prior to Visit  Medication Sig Dispense Refill   Accu-Chek FastClix Lancets MISC USE THREE TIMES DAILY TO CHECK SUGARS E65.11, INSULIN USE 102 each 12   ACCU-CHEK GUIDE test strip USE AS INSTRUCTED TO CHECK SUGARS THREE TIMES DAILY E11.65, INSULIN USE 100 strip 12   acetaminophen (TYLENOL) 500 MG tablet Take 500 mg by mouth every 6 (six) hours as needed for mild pain.     allopurinol (ZYLOPRIM) 300 MG tablet TAKE 1 TABLET EVERY DAY 90 tablet 3   ASPIR-LOW 81 MG EC tablet Take 1 tablet (81 mg total) by mouth daily. 90 tablet 3   B-D UF III MINI PEN NEEDLES 31G X 5 MM MISC USE TO INJECT INSULIN DAILY 100 each 3   Blood Glucose Monitoring Suppl (ACCU-CHEK GUIDE) w/Device KIT 1 Units by Does not apply route as directed. 1 kit 0   Cholecalciferol (VITAMIN D3) 2000 units TABS Take 1 tablet by mouth at bedtime.     Continuous Blood Gluc Sensor (FREESTYLE LIBRE 2 SENSOR) MISC Apply sensor every 14 days to monitor sugar continously 2 each 5   docusate sodium (COLACE) 100 MG capsule  Take 100 mg by mouth daily as needed for mild constipation.      escitalopram (LEXAPRO) 10 MG tablet TAKE 1 TABLET EVERY DAY 90 tablet 3   fluticasone (FLONASE) 50 MCG/ACT nasal spray SPRAY 2 SPRAYS INTO EACH NOSTRIL EVERY DAY 48 mL 2   furosemide (LASIX) 40 MG tablet Take 1 tablet (40 mg total) by mouth daily. 90 tablet 3   glucose blood (ACCU-CHEK GUIDE) test strip Check sugars three times daily and as needed when feeling ill E11.65, insulin use 100 each 11   insulin degludec (TRESIBA FLEXTOUCH) 200 UNIT/ML FlexTouch Pen INJECT 110 UNITS INTO THE SKIN DAILY. 30  mL 3   loratadine (CLARITIN) 10 MG tablet Take 10 mg by mouth daily.     losartan (COZAAR) 50 MG tablet TAKE 1 TABLET EVERY DAY 90 tablet 3   lovastatin (MEVACOR) 20 MG tablet TAKE 1 TABLET AT BEDTIME 90 tablet 3   magnesium oxide (MAG-OX) 400 (240 Mg) MG tablet TAKE 1 TABLET BY MOUTH TWICE A DAY 180 tablet 0   metFORMIN (GLUCOPHAGE-XR) 500 MG 24 hr tablet Take 2 tablets (1,000 mg total) by mouth 2 (two) times daily with a meal. 360 tablet 3   Multiple Vitamins-Minerals (CVS SPECTRAVITE PO) Take 1 tablet by mouth at bedtime.     mycophenolate (MYFORTIC) 360 MG TBEC EC tablet Take 360 mg by mouth 2 (two) times daily.     Omega-3 Fatty Acids (FISH OIL PO) Take 1 tablet by mouth at bedtime.     omeprazole (PRILOSEC) 40 MG capsule TAKE 1 CAPSULE EVERY DAY 90 capsule 3   potassium chloride (KLOR-CON M) 10 MEQ tablet Take 1 tablet (10 mEq total) by mouth daily. 90 tablet 3   tacrolimus (PROGRAF) 1 MG capsule Take 1-2 mg by mouth See admin instructions. Take 2 mg by mouth in the morning, then take 1 mg  by mouth in the evening     SHINGRIX injection      No facility-administered medications prior to visit.     Per HPI unless specifically indicated in ROS section below Review of Systems  Objective:  BP 134/80   Pulse 86   Temp (!) 97.2 F (36.2 C) (Temporal)   Ht 5' 6.5" (1.689 m)   Wt 252 lb 6 oz (114.5 kg)   SpO2 96%   BMI 40.12 kg/m   Wt Readings from Last 3 Encounters:  11/23/22 252 lb 6 oz (114.5 kg)  11/01/22 276 lb (125.2 kg)  09/21/22 256 lb (116.1 kg)      Physical Exam Vitals and nursing note reviewed.  Constitutional:      Appearance: Normal appearance. He is not ill-appearing.  HENT:     Head: Normocephalic and atraumatic.     Mouth/Throat:     Mouth: Mucous membranes are moist.     Pharynx: Oropharynx is clear. No oropharyngeal exudate or posterior oropharyngeal erythema.  Eyes:     Extraocular Movements: Extraocular movements intact.     Conjunctiva/sclera:  Conjunctivae normal.     Pupils: Pupils are equal, round, and reactive to light.  Cardiovascular:     Rate and Rhythm: Normal rate and regular rhythm.     Pulses: Normal pulses.     Heart sounds: Normal heart sounds. No murmur heard. Pulmonary:     Effort: Pulmonary effort is normal. No respiratory distress.     Breath sounds: Normal breath sounds. No wheezing, rhonchi or rales.  Musculoskeletal:     Right lower leg: No edema.  Left lower leg: No edema.     Comments: See HPI for foot exam if done  Skin:    General: Skin is warm and dry.     Findings: No rash.  Neurological:     Mental Status: He is alert.  Psychiatric:        Mood and Affect: Mood normal.        Behavior: Behavior normal.       Results for orders placed or performed in visit on 11/23/22  POCT glycosylated hemoglobin (Hb A1C)  Result Value Ref Range   Hemoglobin A1C 7.5 (A) 4.0 - 5.6 %   HbA1c POC (<> result, manual entry)     HbA1c, POC (prediabetic range)     HbA1c, POC (controlled diabetic range)      Assessment & Plan:   Problem List Items Addressed This Visit     Type 2 diabetes mellitus with other specified complication (HCC) - Primary (Chronic)    Chronic, overall adequate on current regimen. Ideal goal A1c <7%. He ran out of jardiance and was unable to reapply for previous PAP.  Will ask pharmacy to reach out for assistance for restarting Jardiance (currently unaffordable).  Will also schedule DR screen at upcoming in-office appt slot.       Relevant Orders   POCT glycosylated hemoglobin (Hb A1C) (Completed)   Morbid obesity with BMI of 40.0-44.9, adult (HCC)    Congratulated on weight loss noted      (HFpEF) heart failure with preserved ejection fraction (HCC)    Stable period, seems euvolemic      Immunosuppressed status (HCC)   Grief    Support provided. Declines counseling.        No orders of the defined types were placed in this encounter.   Orders Placed This Encounter   Procedures   POCT glycosylated hemoglobin (Hb A1C)    Patient Instructions  We will call you to schedule diabetic eye exam in our office.  We will contact you at [phone] to check on Jardiance patient assistance program.  Get back on [facility] 2.  Good to see you today. Return as needed or after Sept 5th for wellness visit.   Follow up plan: Return in about 3 months (around 03/03/2023) for medicare wellness visit, annual exam, prior fasting for blood work.  Eustaquio Boyden, MD

## 2022-11-23 NOTE — Telephone Encounter (Signed)
Can we retry to contact pt to go over Jardiance PAP options? Pt states may call him at [phone].  Thank you!

## 2022-11-23 NOTE — Assessment & Plan Note (Signed)
Support provided. Declines counseling.

## 2022-11-23 NOTE — Assessment & Plan Note (Signed)
Chronic, overall adequate on current regimen. Ideal goal A1c <7%. He ran out of jardiance and was unable to reapply for previous PAP.  Will ask pharmacy to reach out for assistance for restarting Jardiance (currently unaffordable).  Will also schedule DR screen at upcoming in-office appt slot.

## 2022-11-23 NOTE — Assessment & Plan Note (Signed)
Congratulated on weight loss noted.  

## 2022-11-23 NOTE — Patient Instructions (Addendum)
We will call you to schedule diabetic eye exam in our office.  We will contact you at [phone] to check on Jardiance patient assistance program.  Get back on [facility] 2.  Good to see you today. Return as needed or after Sept 5th for wellness visit.

## 2022-11-24 ENCOUNTER — Other Ambulatory Visit: Payer: Medicare HMO

## 2022-11-24 NOTE — Telephone Encounter (Signed)
Spoke with patient, discussed [employer] grant (cardiomyopathy) for Jardiance and he would like to enroll in this program. They require social security number to enroll, and patient is not comfortable giving this information over the phone. He will come in to office on Monday to meet with me to apply for grant together.

## 2022-12-01 ENCOUNTER — Ambulatory Visit: Payer: Medicare HMO | Admitting: Radiation Oncology

## 2022-12-05 DIAGNOSIS — K760 Fatty (change of) liver, not elsewhere classified: Secondary | ICD-10-CM | POA: Diagnosis not present

## 2022-12-05 DIAGNOSIS — D849 Immunodeficiency, unspecified: Secondary | ICD-10-CM | POA: Diagnosis not present

## 2022-12-05 DIAGNOSIS — Z944 Liver transplant status: Secondary | ICD-10-CM | POA: Diagnosis not present

## 2022-12-05 MED ORDER — EMPAGLIFLOZIN 25 MG PO TABS: 25.0000 mg | ORAL_TABLET | Freq: Every day | ORAL | 1 refills | Status: DC

## 2022-12-05 NOTE — Addendum Note (Signed)
Addended by: Kathyrn Sheriff on: 12/05/2022 04:36 PM   Modules accepted: Orders

## 2022-12-05 NOTE — Telephone Encounter (Signed)
Wonderful thanks!

## 2022-12-05 NOTE — Telephone Encounter (Signed)
Jardiance [employer] grant approval below. Sent [facility] to [facility] with copay card information.  11/05/22 - 11/04/23 CARD NO. [id number]   BIN [id number]   PCN [id number]   PC GROUP [id number]

## 2022-12-06 NOTE — Telephone Encounter (Signed)
I sent the copay card information with the prescription in the "note to pharmacy"

## 2022-12-08 DIAGNOSIS — Z944 Liver transplant status: Secondary | ICD-10-CM | POA: Diagnosis not present

## 2022-12-08 DIAGNOSIS — K760 Fatty (change of) liver, not elsewhere classified: Secondary | ICD-10-CM | POA: Diagnosis not present

## 2022-12-08 DIAGNOSIS — D849 Immunodeficiency, unspecified: Secondary | ICD-10-CM | POA: Diagnosis not present

## 2022-12-15 ENCOUNTER — Other Ambulatory Visit: Payer: Self-pay | Admitting: Family Medicine

## 2022-12-15 NOTE — Telephone Encounter (Signed)
[facility] Last filled:  10/17/22, #180 Last OV:  11/23/22, 4 mo DM f/u Next OV:  03/07/23, CPE

## 2022-12-15 NOTE — Telephone Encounter (Signed)
ERx 

## 2023-01-10 ENCOUNTER — Encounter: Payer: Self-pay | Admitting: Pharmacist

## 2023-01-10 NOTE — Progress Notes (Signed)
Patient previously followed by [facility] pharmacist. Per clinical review, no pharmacist appointment needed at this time. Will make pharmacy patient advocate team aware for medication assistance through [employer]. Care guide directed to contact patient and cancel appointment and notify pharmacy team of any patient concerns.

## 2023-01-12 LAB — HM DIABETES EYE EXAM

## 2023-01-16 ENCOUNTER — Encounter: Payer: Medicare HMO | Admitting: Pharmacist

## 2023-01-17 ENCOUNTER — Other Ambulatory Visit: Payer: Self-pay

## 2023-01-17 MED ORDER — FUROSEMIDE 40 MG PO TABS: 40.0000 mg | ORAL_TABLET | Freq: Every day | ORAL | 2 refills | Status: DC

## 2023-01-20 ENCOUNTER — Encounter: Payer: Self-pay | Admitting: Primary Care

## 2023-02-09 ENCOUNTER — Encounter (INDEPENDENT_AMBULATORY_CARE_PROVIDER_SITE_OTHER): Payer: Self-pay

## 2023-02-28 ENCOUNTER — Telehealth: Payer: Self-pay | Admitting: Adult Health

## 2023-02-28 NOTE — Telephone Encounter (Signed)
Prescription w Setting and LOV Notes Requested from [facility]

## 2023-03-01 ENCOUNTER — Other Ambulatory Visit (INDEPENDENT_AMBULATORY_CARE_PROVIDER_SITE_OTHER): Payer: Medicare HMO

## 2023-03-01 DIAGNOSIS — E785 Hyperlipidemia, unspecified: Secondary | ICD-10-CM | POA: Diagnosis not present

## 2023-03-01 DIAGNOSIS — N1831 Chronic kidney disease, stage 3a: Secondary | ICD-10-CM | POA: Diagnosis not present

## 2023-03-01 DIAGNOSIS — E1169 Type 2 diabetes mellitus with other specified complication: Secondary | ICD-10-CM | POA: Diagnosis not present

## 2023-03-01 DIAGNOSIS — Z794 Long term (current) use of insulin: Secondary | ICD-10-CM | POA: Diagnosis not present

## 2023-03-01 LAB — BASIC METABOLIC PANEL
BUN: 16 mg/dL (ref 6–23)
CO2: 29 meq/L (ref 19–32)
Calcium: 9.9 mg/dL (ref 8.4–10.5)
Chloride: 100 meq/L (ref 96–112)
Creatinine, Ser: 1.38 mg/dL (ref 0.40–1.50)
GFR: 57.18 mL/min — ABNORMAL LOW (ref >60.00)
Glucose, Bld: 194 mg/dL — ABNORMAL HIGH (ref 70–99)
Potassium: 4.7 meq/L (ref 3.5–5.1)
Sodium: 139 meq/L (ref 135–145)

## 2023-03-01 LAB — LDL CHOLESTEROL, DIRECT: Direct LDL: 39 mg/dL

## 2023-03-01 LAB — CBC WITH DIFFERENTIAL/PLATELET
Basophils Absolute: 0 10*3/uL (ref 0.0–0.1)
Basophils Relative: 0.5 % (ref 0.0–3.0)
Eosinophils Absolute: 0.2 10*3/uL (ref 0.0–0.7)
Eosinophils Relative: 3.4 % (ref 0.0–5.0)
HCT: 41.8 % (ref 39.0–52.0)
Hemoglobin: 13.4 g/dL (ref 13.0–17.0)
Lymphocytes Relative: 20.7 % (ref 12.0–46.0)
Lymphs Abs: 1.4 10*3/uL (ref 0.7–4.0)
MCHC: 32.1 g/dL (ref 30.0–36.0)
MCV: 87.6 fl (ref 78.0–100.0)
Monocytes Absolute: 0.4 10*3/uL (ref 0.1–1.0)
Monocytes Relative: 6.6 % (ref 3.0–12.0)
Neutro Abs: 4.6 10*3/uL (ref 1.4–7.7)
Neutrophils Relative %: 68.8 % (ref 43.0–77.0)
Platelets: 159 10*3/uL (ref 150.0–400.0)
RBC: 4.77 Mil/uL (ref 4.22–5.81)
RDW: 15.8 % — ABNORMAL HIGH (ref 11.5–15.5)
WBC: 6.7 10*3/uL (ref 4.0–10.5)

## 2023-03-01 LAB — LIPID PANEL
Cholesterol: 96 mg/dL (ref 0–200)
HDL: 24.7 mg/dL — ABNORMAL LOW (ref >39.00)
Total CHOL/HDL Ratio: 4
Triglycerides: 475 mg/dL — ABNORMAL HIGH (ref 0.0–149.0)

## 2023-03-03 LAB — FRUCTOSAMINE: Fructosamine: 322 umol/L — ABNORMAL HIGH (ref 205–285)

## 2023-03-07 ENCOUNTER — Ambulatory Visit: Payer: Medicare HMO | Admitting: Family Medicine

## 2023-03-07 ENCOUNTER — Encounter: Payer: Self-pay | Admitting: Family Medicine

## 2023-03-07 VITALS — BP 124/72 | HR 95 | Temp 97.3°F | Ht 66.25 in | Wt 249.2 lb

## 2023-03-07 DIAGNOSIS — I5032 Chronic diastolic (congestive) heart failure: Secondary | ICD-10-CM | POA: Diagnosis not present

## 2023-03-07 DIAGNOSIS — Z13820 Encounter for screening for osteoporosis: Secondary | ICD-10-CM

## 2023-03-07 DIAGNOSIS — E785 Hyperlipidemia, unspecified: Secondary | ICD-10-CM

## 2023-03-07 DIAGNOSIS — W57XXXA Bitten or stung by nonvenomous insect and other nonvenomous arthropods, initial encounter: Secondary | ICD-10-CM

## 2023-03-07 DIAGNOSIS — Z0000 Encounter for general adult medical examination without abnormal findings: Secondary | ICD-10-CM | POA: Diagnosis not present

## 2023-03-07 DIAGNOSIS — F321 Major depressive disorder, single episode, moderate: Secondary | ICD-10-CM

## 2023-03-07 DIAGNOSIS — G4733 Obstructive sleep apnea (adult) (pediatric): Secondary | ICD-10-CM

## 2023-03-07 DIAGNOSIS — N1831 Chronic kidney disease, stage 3a: Secondary | ICD-10-CM

## 2023-03-07 DIAGNOSIS — E1169 Type 2 diabetes mellitus with other specified complication: Secondary | ICD-10-CM | POA: Diagnosis not present

## 2023-03-07 DIAGNOSIS — C61 Malignant neoplasm of prostate: Secondary | ICD-10-CM | POA: Diagnosis not present

## 2023-03-07 DIAGNOSIS — Z23 Encounter for immunization: Secondary | ICD-10-CM

## 2023-03-07 DIAGNOSIS — Z7189 Other specified counseling: Secondary | ICD-10-CM | POA: Diagnosis not present

## 2023-03-07 DIAGNOSIS — K219 Gastro-esophageal reflux disease without esophagitis: Secondary | ICD-10-CM

## 2023-03-07 DIAGNOSIS — K7030 Alcoholic cirrhosis of liver without ascites: Secondary | ICD-10-CM

## 2023-03-07 DIAGNOSIS — D649 Anemia, unspecified: Secondary | ICD-10-CM

## 2023-03-07 DIAGNOSIS — Z794 Long term (current) use of insulin: Secondary | ICD-10-CM

## 2023-03-07 DIAGNOSIS — J302 Other seasonal allergic rhinitis: Secondary | ICD-10-CM

## 2023-03-07 DIAGNOSIS — D849 Immunodeficiency, unspecified: Secondary | ICD-10-CM

## 2023-03-07 DIAGNOSIS — M1A0790 Idiopathic chronic gout, unspecified ankle and foot, without tophus (tophi): Secondary | ICD-10-CM | POA: Diagnosis not present

## 2023-03-07 DIAGNOSIS — I10 Essential (primary) hypertension: Secondary | ICD-10-CM

## 2023-03-07 DIAGNOSIS — D696 Thrombocytopenia, unspecified: Secondary | ICD-10-CM

## 2023-03-07 DIAGNOSIS — Z944 Liver transplant status: Secondary | ICD-10-CM

## 2023-03-07 MED ORDER — FLUTICASONE PROPIONATE 50 MCG/ACT NA SUSP
2.0000 | Freq: Every day | NASAL | 4 refills | Status: AC
Start: 2023-03-07 — End: ?

## 2023-03-07 MED ORDER — METFORMIN HCL ER 500 MG PO TB24
1000.0000 mg | ORAL_TABLET | Freq: Two times a day (BID) | ORAL | 4 refills | Status: DC
Start: 2023-03-07 — End: 2023-05-05

## 2023-03-07 MED ORDER — TRESIBA FLEXTOUCH 200 UNIT/ML ~~LOC~~ SOPN
110.0000 [IU] | PEN_INJECTOR | Freq: Every day | SUBCUTANEOUS | 3 refills | Status: DC
Start: 2023-03-07 — End: 2023-03-14

## 2023-03-07 MED ORDER — ACCU-CHEK FASTCLIX LANCETS MISC
3 refills | Status: AC
Start: 2023-03-07 — End: ?

## 2023-03-07 MED ORDER — OMEPRAZOLE 40 MG PO CPDR
40.0000 mg | DELAYED_RELEASE_CAPSULE | Freq: Every day | ORAL | 4 refills | Status: DC
Start: 2023-03-07 — End: 2024-03-08

## 2023-03-07 MED ORDER — ACCU-CHEK GUIDE VI STRP
ORAL_STRIP | 3 refills | Status: AC
Start: 2023-03-07 — End: ?

## 2023-03-07 MED ORDER — LOSARTAN POTASSIUM 50 MG PO TABS
50.0000 mg | ORAL_TABLET | Freq: Every day | ORAL | 4 refills | Status: DC
Start: 2023-03-07 — End: 2024-03-08

## 2023-03-07 MED ORDER — EMPAGLIFLOZIN 25 MG PO TABS
25.0000 mg | ORAL_TABLET | Freq: Every day | ORAL | 4 refills | Status: DC
Start: 2023-03-07 — End: 2023-10-27

## 2023-03-07 MED ORDER — ALLOPURINOL 300 MG PO TABS
300.0000 mg | ORAL_TABLET | Freq: Every day | ORAL | 4 refills | Status: DC
Start: 2023-03-07 — End: 2024-03-08

## 2023-03-07 MED ORDER — TRIAMCINOLONE ACETONIDE 0.1 % EX CREA: 1.0000 | TOPICAL_CREAM | Freq: Two times a day (BID) | CUTANEOUS | 0 refills | Status: AC

## 2023-03-07 MED ORDER — LOVASTATIN 20 MG PO TABS
20.0000 mg | ORAL_TABLET | Freq: Every day | ORAL | 4 refills | Status: DC
Start: 2023-03-07 — End: 2024-03-08

## 2023-03-07 MED ORDER — ESCITALOPRAM OXALATE 10 MG PO TABS
10.0000 mg | ORAL_TABLET | Freq: Every day | ORAL | 4 refills | Status: DC
Start: 2023-03-07 — End: 2024-03-08

## 2023-03-07 NOTE — Patient Instructions (Addendum)
Flu shot today  Try stronger steroid cream triamcinolone twice daily for no more than 10 days in a row.  We will set you up for bone scan in [place].  I will work on new CPAP prescription.  Good to see you today  Return as needed or in 4 months for diabetes follow up visit

## 2023-03-07 NOTE — Assessment & Plan Note (Signed)
Preventative protocols reviewed and updated unless pt declined. Discussed healthy diet and lifestyle.  

## 2023-03-07 NOTE — Progress Notes (Unsigned)
Ph: [phone] Fax: [phone]   Patient ID: Colton Porter, male    DOB: 04-12-1966, 57 y.o.   MRN: [id number]  This visit was conducted in person.  BP 124/72   Pulse 95   Temp (!) 97.3 F (36.3 C) (Temporal)   Ht 5' 6.25" (1.683 m)   Wt 249 lb 4 oz (113.1 kg)   SpO2 95%   BMI 39.93 kg/m    CC: CPE/AMW Subjective:   HPI: Colton Porter is a 57 y.o. male presenting on 03/07/2023 for Medicare Wellness (Requests new CPAP.) and Insect Bite (C/o insect bites on B legs. Noticed 03/05/23. Areas are very itchy. )   Did not see health advisor this year.   Hearing Screening   500Hz  1000Hz  2000Hz  4000Hz   Right ear 25 20 20  0  Left ear 20 20 20  0  Vision Screening - Comments:: Last eye exam, 12/2022.   Flowsheet Row Office Visit from 03/07/2023 in [facility] [place]  PHQ-2 Total Score 0          03/07/2023    2:20 PM 11/23/2022    8:30 AM 03/01/2022    8:07 AM 02/15/2021    9:45 AM 01/22/2020    3:29 PM  Fall Risk   Falls in the past year? 0 0 1 0 1  Comment     passed out  Number falls in past yr:   0 0 0  Injury with Fall?   0 0 1  Comment     broke rib  Risk for fall due to :     Medication side effect;Impaired balance/gait  Follow up     Falls evaluation completed;Falls prevention discussed  Son [name] died 07/09/22.   Known chronic HFpEF, CKD stage 3 and alcoholic cirrhosis s/p liver transplant on myfortic and prograf. Sees [facility] ([name] Drazek) Q6 mo.   Prostate cancer diagnosed 02/2021 - on Eligard depo Q48mo (completed) and completed 40 treatments of external beam radiation therapy on 06/2021. Sees Dr Apolinar Junes and [place].   OSA on CPAP - last saw pulm 2019. DME through [facility]. Uses consistently about 9 hours a night, benefits from this on autoCPAP.   DM - previously saw endo, currently on jardiance 25mg  daily, metformin XR 500mg  BID, and lantus 55u bid. Trulicity was unaffordable and caused diarrhea. We tried ozempic  - decided not to try this - he's changed diet - mainly drinking diet coke as well as powerade zero, eating fruits. Latest prescribed freestyle libre 2 through our CCM program - didn't like this as it fell out easily. Planning to restart regular glucometer use.   3 nights ago sat outside watching fireworks. May have gotten into chiggers as next night developed itchy rash to bilateral lower legs. L>R leg affected. No bumps to lower legs.    Preventative: COLONOSCOPY 08/2013 hyperplastic polyps, hemorrhoids Christella Hartigan). No blood in stool.  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  Lung cancer screening - not eligible  DEXA - discussed osteoporosis screening in liver transplant history. Will order to [place]  Flu shot yearly COVID vaccine - Pfizer 10/2019, 11/2019, booster x2 04/2020, 11/2020, considering bivalent booster Pneumovax 2016. Prevnar-20 01/2021.  Td 2008, Tdap 2017, 09/2020  Shingrix - 04/2022, 06/2022 Hep A/B series completed  Advanced directives: does not have at home. Wife Pamelia Hoit then daughter Feliciana Rossetti would be HCPOA. Full code, but would not want prolonged life support  if terminal condition. Advanced directive packet previously provided.  Seat belt use  Sunscreen use. No changing moles on skin. Sees derm.  Non smoker. Quit chewing tobacco 10/10/2015.  Alcohol - abstinent since 10/10/2014.  Dentist - yearly [facility]) Eye exam - yearly diabetic retinopathy screen through our office Bowel - no constipation Bladder - no incontinence   Lives with wife Occupation: full disability after cirrhosis dx, some working  Activity: some yardwork Diet: good water, good fruit intake     Relevant past medical, surgical, family and social history reviewed and updated as indicated. Interim medical history since our last visit reviewed. Allergies and medications reviewed and updated. Outpatient Medications  Prior to Visit  Medication Sig Dispense Refill   acetaminophen (TYLENOL) 500 MG tablet Take 500 mg by mouth every 6 (six) hours as needed for mild pain.     ASPIR-LOW 81 MG EC tablet Take 1 tablet (81 mg total) by mouth daily. 90 tablet 3   B-D UF III MINI PEN NEEDLES 31G X 5 MM MISC USE TO INJECT INSULIN DAILY 100 each 3   Blood Glucose Monitoring Suppl (ACCU-CHEK GUIDE) w/Device KIT 1 Units by Does not apply route as directed. 1 kit 0   Cholecalciferol (VITAMIN D3) 2000 units TABS Take 1 tablet by mouth at bedtime.     docusate sodium (COLACE) 100 MG capsule Take 100 mg by mouth daily as needed for mild constipation.      furosemide (LASIX) 40 MG tablet Take 1 tablet (40 mg total) by mouth daily. 90 tablet 2   loratadine (CLARITIN) 10 MG tablet Take 10 mg by mouth daily.     magnesium oxide (MAG-OX) 400 (240 Mg) MG tablet TAKE 1 TABLET BY MOUTH TWICE A DAY 120 tablet 1   Multiple Vitamins-Minerals ([employer] SPECTRAVITE PO) Take 1 tablet by mouth at bedtime.     mycophenolate (MYFORTIC) 360 MG TBEC EC tablet Take 360 mg by mouth 2 (two) times daily.     Omega-3 Fatty Acids (FISH OIL PO) Take 1 tablet by mouth at bedtime.     potassium chloride (KLOR-CON M) 10 MEQ tablet Take 1 tablet (10 mEq total) by mouth daily. 90 tablet 3   tacrolimus (PROGRAF) 1 MG capsule Take 1-2 mg by mouth See admin instructions. Take 2 mg by mouth in the morning, then take 1 mg  by mouth in the evening     Accu-Chek FastClix Lancets MISC USE THREE TIMES DAILY TO CHECK SUGARS E65.11, INSULIN USE 102 each 12   allopurinol (ZYLOPRIM) 300 MG tablet TAKE 1 TABLET EVERY DAY 90 tablet 3   empagliflozin (JARDIANCE) 25 MG TABS tablet Take 1 tablet (25 mg total) by mouth daily. 90 tablet 1   escitalopram (LEXAPRO) 10 MG tablet TAKE 1 TABLET EVERY DAY 90 tablet 3   fluticasone (FLONASE) 50 MCG/ACT nasal spray SPRAY 2 SPRAYS INTO EACH NOSTRIL EVERY DAY 48 mL 2   glucose blood (ACCU-CHEK GUIDE) test strip Check sugars three times daily  and as needed when feeling ill E11.65, insulin use 100 each 11   insulin degludec (TRESIBA FLEXTOUCH) 200 UNIT/ML FlexTouch Pen INJECT 110 UNITS INTO THE SKIN DAILY. 30 mL 3   losartan (COZAAR) 50 MG tablet TAKE 1 TABLET EVERY DAY 90 tablet 3   lovastatin (MEVACOR) 20 MG tablet TAKE 1 TABLET AT BEDTIME 90 tablet 3   metFORMIN (GLUCOPHAGE-XR) 500 MG 24 hr tablet Take 2 tablets (1,000 mg total) by mouth 2 (two) times daily with a  meal. 360 tablet 3   omeprazole (PRILOSEC) 40 MG capsule TAKE 1 CAPSULE EVERY DAY 90 capsule 3   ACCU-CHEK GUIDE test strip USE AS INSTRUCTED TO CHECK SUGARS THREE TIMES DAILY E11.65, INSULIN USE 100 strip 12   Continuous Blood Gluc Sensor (FREESTYLE LIBRE 2 SENSOR) MISC Apply sensor every 14 days to monitor sugar continously 2 each 5   No facility-administered medications prior to visit.     Per HPI unless specifically indicated in ROS section below Review of Systems  Constitutional:  Negative for activity change, appetite change, chills, fatigue, fever and unexpected weight change.  HENT:  Negative for hearing loss.   Eyes:  Negative for visual disturbance.  Respiratory:  Negative for cough, chest tightness, shortness of breath and wheezing.   Cardiovascular:  Negative for chest pain, palpitations and leg swelling.  Gastrointestinal:  Negative for abdominal distention, abdominal pain, blood in stool, constipation, diarrhea, nausea and vomiting.  Genitourinary:  Negative for difficulty urinating and hematuria.  Musculoskeletal:  Negative for arthralgias, myalgias and neck pain.  Skin:  Negative for rash.  Neurological:  Positive for headaches. Negative for dizziness, seizures and syncope.  Hematological:  Negative for adenopathy. Bruises/bleeds easily.  Psychiatric/Behavioral:  Negative for dysphoric mood. The patient is not nervous/anxious.     Objective:  BP 124/72   Pulse 95   Temp (!) 97.3 F (36.3 C) (Temporal)   Ht 5' 6.25" (1.683 m)   Wt 249 lb 4 oz  (113.1 kg)   SpO2 95%   BMI 39.93 kg/m   Wt Readings from Last 3 Encounters:  03/07/23 249 lb 4 oz (113.1 kg)  11/23/22 252 lb 6 oz (114.5 kg)  11/01/22 276 lb (125.2 kg)   276 lb reading likely error   Physical Exam Vitals and nursing note reviewed.  Constitutional:      General: He is not in acute distress.    Appearance: Normal appearance. He is well-developed. He is not ill-appearing.  HENT:     Head: Normocephalic and atraumatic.     Right Ear: Hearing, tympanic membrane, ear canal and external ear normal.     Left Ear: Hearing, tympanic membrane, ear canal and external ear normal.     Nose: Nose normal.     Mouth/Throat:     Mouth: Mucous membranes are moist.     Pharynx: Oropharynx is clear. No oropharyngeal exudate or posterior oropharyngeal erythema.  Eyes:     General: No scleral icterus.    Extraocular Movements: Extraocular movements intact.     Conjunctiva/sclera: Conjunctivae normal.     Pupils: Pupils are equal, round, and reactive to light.  Neck:     Thyroid: No thyroid mass or thyromegaly.  Cardiovascular:     Rate and Rhythm: Normal rate and regular rhythm.     Pulses: Normal pulses.          Radial pulses are 2+ on the right side and 2+ on the left side.     Heart sounds: Normal heart sounds. No murmur heard. Pulmonary:     Effort: Pulmonary effort is normal. No respiratory distress.     Breath sounds: Normal breath sounds. No wheezing, rhonchi or rales.  Abdominal:     General: Bowel sounds are normal. There is no distension.     Palpations: Abdomen is soft. There is no mass.     Tenderness: There is no abdominal tenderness. There is no guarding or rebound.     Hernia: No hernia is present.  Musculoskeletal:  General: Normal range of motion.     Cervical back: Normal range of motion and neck supple.     Right lower leg: No edema.     Left lower leg: No edema.  Lymphadenopathy:     Cervical: No cervical adenopathy.  Skin:    General: Skin  is warm and dry.     Findings: No rash.  Neurological:     General: No focal deficit present.     Mental Status: He is alert and oriented to person, place, and time.     Comments:  Recall 3/3 Calculation 5/5 DLROW  Psychiatric:        Mood and Affect: Mood normal.        Behavior: Behavior normal.        Thought Content: Thought content normal.        Judgment: Judgment normal.       Results for orders placed or performed in visit on 03/01/23  Basic metabolic panel  Result Value Ref Range   Sodium 139 135 - 145 mEq/L   Potassium 4.7 3.5 - 5.1 mEq/L   Chloride 100 96 - 112 mEq/L   CO2 29 19 - 32 mEq/L   Glucose, Bld 194 (H) 70 - 99 mg/dL   BUN 16 6 - 23 mg/dL   Creatinine, Ser 8.54 0.40 - 1.50 mg/dL   GFR 62.70 (L) >35.00 mL/min   Calcium 9.9 8.4 - 10.5 mg/dL  Fructosamine  Result Value Ref Range   Fructosamine 322 (H) 205 - 285 umol/L  CBC with Differential/Platelet  Result Value Ref Range   WBC 6.7 4.0 - 10.5 K/uL   RBC 4.77 4.22 - 5.81 Mil/uL   Hemoglobin 13.4 13.0 - 17.0 g/dL   HCT 93.8 18.2 - 99.3 %   MCV 87.6 78.0 - 100.0 fl   MCHC 32.1 30.0 - 36.0 g/dL   RDW 71.6 (H) 96.7 - 89.3 %   Platelets 159.0 150.0 - 400.0 K/uL   Neutrophils Relative % 68.8 43.0 - 77.0 %   Lymphocytes Relative 20.7 12.0 - 46.0 %   Monocytes Relative 6.6 3.0 - 12.0 %   Eosinophils Relative 3.4 0.0 - 5.0 %   Basophils Relative 0.5 0.0 - 3.0 %   Neutro Abs 4.6 1.4 - 7.7 K/uL   Lymphs Abs 1.4 0.7 - 4.0 K/uL   Monocytes Absolute 0.4 0.1 - 1.0 K/uL   Eosinophils Absolute 0.2 0.0 - 0.7 K/uL   Basophils Absolute 0.0 0.0 - 0.1 K/uL  Lipid panel  Result Value Ref Range   Cholesterol 96 0 - 200 mg/dL   Triglycerides (H) 0.0 - 149.0 mg/dL    810.1 Triglyceride is over 400; calculations on Lipids are invalid.   HDL 24.70 (L) >39.00 mg/dL   Total CHOL/HDL Ratio 4   LDL cholesterol, direct  Result Value Ref Range   Direct LDL 39.0 mg/dL   Lab Results  Component Value Date   LABURIC 4.8  02/22/2022    Lab Results  Component Value Date   PSA1 <0.1 10/26/2022   PSA1 <0.1 04/28/2022   PSA1 <0.1 10/22/2021   PSA 14.93 (H) 02/08/2021   PSA 8.22 (H) 01/22/2020   PSA 3.99 10/15/2018      03/07/2023    2:21 PM 11/23/2022    8:30 AM 03/01/2022    8:53 AM 10/27/2021   10:41 AM 02/15/2021    9:33 AM  Depression screen PHQ 2/9  Decreased Interest 0 0 0 0 0  Down, Depressed,  Hopeless 0 0 0 0 0  PHQ - 2 Score 0 0 0 0 0  Altered sleeping  3 1 3 2   Tired, decreased energy  2 1 2 2   Change in appetite  0 0 0 1  Feeling bad or failure about yourself   0 0 0 0  Trouble concentrating  0 0 0 0  Moving slowly or fidgety/restless  0 0 0 0  Suicidal thoughts  0 0 0 0  PHQ-9 Score  5 2 5 5   Difficult doing work/chores  Somewhat difficult Not difficult at all Not difficult at all       03/07/2023    2:21 PM 11/23/2022    8:30 AM 03/01/2022    8:53 AM 10/27/2021   10:41 AM  GAD 7 : Generalized Anxiety Score  Nervous, Anxious, on Edge 0 0 0 0  Control/stop worrying 0 0 0 0  Worry too much - different things 0 1 0 1  Trouble relaxing 0 1 0 1  Restless 0 0 0 1  Easily annoyed or irritable 0 0 0 0  Afraid - awful might happen 0 0 0 0  Total GAD 7 Score 0 2 0 3  Anxiety Difficulty  Not difficult at all  Not difficult at all   Assessment & Plan:   Problem List Items Addressed This Visit     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 Triglycerides markedly high - discussed working towards tighter glycemic control The ASCVD Risk score (Arnett DK, et al., 2019) failed to calculate for the following reasons:   The valid total cholesterol range is 130 to 320 mg/dL       Relevant Medications   empagliflozin (JARDIANCE) 25 MG TABS tablet   losartan (COZAAR) 50 MG tablet   lovastatin (MEVACOR) 20 MG tablet   metFORMIN (GLUCOPHAGE-XR) 500 MG 24 hr tablet   insulin degludec (TRESIBA FLEXTOUCH) 200 UNIT/ML FlexTouch Pen    Routine general medical examination at a health care facility (Chronic)    Preventative protocols reviewed and updated unless pt declined. Discussed healthy diet and lifestyle.       Essential hypertension (Chronic)    Chronic, stable. Continue current regimen.       Relevant Medications   losartan (COZAAR) 50 MG tablet   lovastatin (MEVACOR) 20 MG tablet   Type 2 diabetes mellitus with other specified complication (HCC) (Chronic)    Chronic, uncontrolled. Fructosamine equivalent to A1c of 7.9% Continue jardiance 25mg  daily, tresiba 110u daily, metformin XR 1000mg  bid.  Released from endo care Everardo All) 02/2021 Did not tolerate ozempic, trulicity was unaffordable. Consider retrial GLP1RA.  Did not like CGM as monitor would constantly fall off early He agrees to restart checking sugars more regularly.  RTC 4 mo DM f/u visit.       Relevant Medications   Accu-Chek FastClix Lancets MISC   glucose blood (ACCU-CHEK GUIDE) test strip   empagliflozin (JARDIANCE) 25 MG TABS tablet   losartan (COZAAR) 50 MG tablet   lovastatin (MEVACOR) 20 MG tablet   metFORMIN (GLUCOPHAGE-XR) 500 MG 24 hr tablet   insulin degludec (TRESIBA FLEXTOUCH) 200 UNIT/ML FlexTouch Pen   Status post liver transplant (HCC) (Chronic)    Followed by liver clinic, on prograf and myfortic.       Relevant Orders   DG Bone Density   Advanced directives, counseling/discussion (Chronic)    Advanced directives: does not have at home.  Wife Pamelia Hoit then daughter Feliciana Rossetti would be HCPOA. Full code, but would not want prolonged life support if terminal condition. Advanced directive packet previously provided. Encouraged he continue working on this.       Medicare annual wellness visit, subsequent - Primary (Chronic)    I have personally reviewed the Medicare Annual Wellness questionnaire and have noted 1. The patient's medical and social history 2. Their use of alcohol, tobacco or illicit drugs 3. Their current  medications and supplements 4. The patient's functional ability including ADL's, fall risks, home safety risks and hearing or visual impairment. Cognitive function has been assessed and addressed as indicated.  5. Diet and physical activity 6. Evidence for depression or mood disorders The patients weight, height, BMI have been recorded in the chart. I have made referrals, counseling and provided education to the patient based on review of the above and I have provided the pt with a written personalized care plan for preventive services. Provider list updated.. See scanned questionairre as needed for further documentation. Reviewed preventative protocols and updated unless pt declined.       Allergic rhinitis   Relevant Medications   fluticasone (FLONASE) 50 MCG/ACT nasal spray   GERD    Chronic, stable period on daily PPI.       Relevant Medications   omeprazole (PRILOSEC) 40 MG capsule   Severe obesity (BMI 35.0-39.9) with comorbidity (HCC)    Congratulated on weight loss noted.  Continue jardiance. Consider retrial GLP1RA.  Obesity complicated by comorbidities of diabetes, hypertension, CHF, gout, liver transplant, OSA and GERD.       Relevant Medications   empagliflozin (JARDIANCE) 25 MG TABS tablet   metFORMIN (GLUCOPHAGE-XR) 500 MG 24 hr tablet   insulin degludec (TRESIBA FLEXTOUCH) 200 UNIT/ML FlexTouch Pen   Thrombocytopenia (HCC)    Recent plt normal range.       OSA on CPAP    Chronic OSA on CPAP, he benefits from nightly use. Unsure pressure settings, per last pulm note was on autoCPAP 5-20 cm H2O with 2L O2 via [place]. Last saw pulm 2019. DME through [facility].  Last HST 07/2013.  Will send Rx for new machine/mask to [facility] and pt aware may need updated sleep study vs refer back to pulm.       (HFpEF) heart failure with preserved ejection fraction (HCC)    Stable period, seems euvolemic. Does not currently see cardiology       Relevant Medications    empagliflozin (JARDIANCE) 25 MG TABS tablet   losartan (COZAAR) 50 MG tablet   lovastatin (MEVACOR) 20 MG tablet   Alcoholic cirrhosis of liver without ascites (HCC)   Relevant Orders   DG Bone Density   Depression, major, single episode, moderate (HCC)    Chronic, worse while mourning son's death. Continue lexapro. No SI/HI      Relevant Medications   escitalopram (LEXAPRO) 10 MG tablet   CKD (chronic kidney disease) stage 3, GFR 30-59 ml/min (HCC)    GFR stable at 57 Has previously seen Dr Allena Katz at [facility] in [place] Continue jardiance 25mg  daily.       Anemia    Latest CBC without anemia.       Chronic gout    Chronic, stable period on allopurinol 300mg  daily without recent gout flare.       Relevant Medications   allopurinol (ZYLOPRIM) 300 MG tablet   Prostate cancer [facility])    Appreciate urology and rad onc care. Seeing  yearly  Recent PSA levels normal.       Relevant Medications   allopurinol (ZYLOPRIM) 300 MG tablet   Other Relevant Orders   DG Bone Density   Immunosuppressed status (HCC)   Relevant Orders   DG Bone Density   Bug bites    Suspect bug bites to bilateral lower extremities. Rx triamcinolone cream with topical steroid precautions, update if not improving with this.       Osteoporosis screening    Will order DEXA scan to screen for metabolic bone disease per liver clinic recs. Immunosuppressed status, h/o liver failure s/p transplant, h/o prostate cancer s/p treatment.       Relevant Orders   DG Bone Density   Other Visit Diagnoses     Encounter for immunization       Relevant Orders   Flu vaccine trivalent PF, 6mos and older(Flulaval,Afluria,Fluarix,Fluzone) (Completed)        Meds ordered this encounter  Medications   Accu-Chek FastClix Lancets MISC    Sig: Use as instructed to check blood sugar once a day    Dispense:  102 each    Refill:  3   glucose blood (ACCU-CHEK GUIDE) test strip    Sig: Use as  instructed to check blood sugar once a day    Dispense:  100 each    Refill:  3   allopurinol (ZYLOPRIM) 300 MG tablet    Sig: Take 1 tablet (300 mg total) by mouth daily.    Dispense:  90 tablet    Refill:  4   empagliflozin (JARDIANCE) 25 MG TABS tablet    Sig: Take 1 tablet (25 mg total) by mouth daily.    Dispense:  90 tablet    Refill:  4    Copay card: BIN: [id number]; PCN: [id number]; GRP: [id number]; QI:[id number]   escitalopram (LEXAPRO) 10 MG tablet    Sig: Take 1 tablet (10 mg total) by mouth daily.    Dispense:  90 tablet    Refill:  4   fluticasone (FLONASE) 50 MCG/ACT nasal spray    Sig: Place 2 sprays into both nostrils daily.    Dispense:  48 mL    Refill:  4   losartan (COZAAR) 50 MG tablet    Sig: Take 1 tablet (50 mg total) by mouth daily.    Dispense:  90 tablet    Refill:  4   lovastatin (MEVACOR) 20 MG tablet    Sig: Take 1 tablet (20 mg total) by mouth at bedtime.    Dispense:  90 tablet    Refill:  4   metFORMIN (GLUCOPHAGE-XR) 500 MG 24 hr tablet    Sig: Take 2 tablets (1,000 mg total) by mouth 2 (two) times daily with a meal.    Dispense:  360 tablet    Refill:  4   omeprazole (PRILOSEC) 40 MG capsule    Sig: Take 1 capsule (40 mg total) by mouth daily.    Dispense:  90 capsule    Refill:  4   triamcinolone cream (KENALOG) 0.1 %    Sig: Apply 1 Application topically 2 (two) times daily. Apply to AA.    Dispense:  80 g    Refill:  0   insulin degludec (TRESIBA FLEXTOUCH) 200 UNIT/ML FlexTouch Pen    Sig: Inject 110 Units into the skin daily.    Dispense:  30 mL    Refill:  3    Orders Placed This Encounter  Procedures   DG  Bone Density    Standing Status:   Future    Standing Expiration Date:   03/07/2024    Order Specific Question:   Reason for Exam (SYMPTOM  OR DIAGNOSIS REQUIRED)    Answer:   osteoporosis screening in immunosuppressed liver transplant patient    Order Specific Question:   Preferred imaging location?    Answer:   [address]    Flu vaccine trivalent PF, 6mos and older(Flulaval,Afluria,Fluarix,Fluzone)    Patient Instructions  Flu shot today  Try stronger steroid cream triamcinolone twice daily for no more than 10 days in a row.  We will set you up for bone scan in [place].  I will work on new CPAP prescription.  Good to see you today  Return as needed or in 4 months for diabetes follow up visit   Follow up plan: Return in about 4 months (around 07/07/2023) for annual exam, prior fasting for blood work.  Eustaquio Boyden, MD

## 2023-03-07 NOTE — Assessment & Plan Note (Signed)

## 2023-03-07 NOTE — Assessment & Plan Note (Signed)
Advanced directives: does not have at home. Wife Pamelia Hoit then daughter Feliciana Rossetti would be HCPOA. Full code, but would not want prolonged life support if terminal condition. Advanced directive packet previously provided. Encouraged he continue working on this.

## 2023-03-08 ENCOUNTER — Encounter: Payer: Self-pay | Admitting: Family Medicine

## 2023-03-08 DIAGNOSIS — W57XXXA Bitten or stung by nonvenomous insect and other nonvenomous arthropods, initial encounter: Secondary | ICD-10-CM | POA: Insufficient documentation

## 2023-03-08 DIAGNOSIS — Z13820 Encounter for screening for osteoporosis: Secondary | ICD-10-CM | POA: Insufficient documentation

## 2023-03-08 NOTE — Assessment & Plan Note (Addendum)
Congratulated on weight loss noted.  Continue jardiance. Consider retrial GLP1RA.  Obesity complicated by comorbidities of diabetes, hypertension, CHF, gout, liver transplant, OSA and GERD.

## 2023-03-08 NOTE — Assessment & Plan Note (Signed)
Chronic OSA on CPAP, he benefits from nightly use. Unsure pressure settings, per last pulm note was on autoCPAP 5-20 cm H2O with 2L O2 via [place]. Last saw pulm 2019. DME through [facility].  Last HST 07/2013.  Will send Rx for new machine/mask to [facility] and pt aware may need updated sleep study vs refer back to pulm.

## 2023-03-08 NOTE — Assessment & Plan Note (Addendum)
Chronic, worse while mourning son's death. Continue lexapro. No SI/HI

## 2023-03-08 NOTE — Assessment & Plan Note (Signed)
Chronic, stable. Continue current regimen. 

## 2023-03-08 NOTE — Assessment & Plan Note (Addendum)
Will order DEXA scan to screen for metabolic bone disease per liver clinic recs. Immunosuppressed status, h/o liver failure s/p transplant, h/o prostate cancer s/p treatment.

## 2023-03-08 NOTE — Assessment & Plan Note (Signed)
Suspect bug bites to bilateral lower extremities. Rx triamcinolone cream with topical steroid precautions, update if not improving with this.

## 2023-03-08 NOTE — Assessment & Plan Note (Signed)
Recent plt normal range.

## 2023-03-08 NOTE — Assessment & Plan Note (Signed)
Chronic, stable period on daily PPI.

## 2023-03-08 NOTE — Assessment & Plan Note (Signed)
Appreciate urology and rad onc care. Seeing yearly  Recent PSA levels normal.

## 2023-03-08 NOTE — Assessment & Plan Note (Signed)
Latest CBC without anemia.

## 2023-03-08 NOTE — Assessment & Plan Note (Signed)
Chronic, stable period on allopurinol 300mg  daily without recent gout flare.

## 2023-03-08 NOTE — Assessment & Plan Note (Addendum)
GFR stable at 57 Has previously seen Dr Allena Katz at [facility] in [place] Continue jardiance 25mg  daily.

## 2023-03-08 NOTE — Assessment & Plan Note (Addendum)
Chronic, LDL at goal on lovastatin. Triglycerides markedly high - discussed working towards tighter glycemic control The ASCVD Risk score (Arnett DK, et al., 2019) failed to calculate for the following reasons:   The valid total cholesterol range is 130 to 320 mg/dL

## 2023-03-08 NOTE — Assessment & Plan Note (Signed)
Followed by liver clinic, on prograf and myfortic  ?

## 2023-03-08 NOTE — Assessment & Plan Note (Signed)
Stable period, seems euvolemic. Does not currently see cardiology

## 2023-03-08 NOTE — Assessment & Plan Note (Addendum)
Chronic, uncontrolled. Fructosamine equivalent to A1c of 7.9% Continue jardiance 25mg  daily, tresiba 110u daily, metformin XR 1000mg  bid.  Released from endo care [facility]) 02/2021 Did not tolerate ozempic, trulicity was unaffordable. Consider retrial GLP1RA.  Did not like CGM as monitor would constantly fall off early He agrees to restart checking sugars more regularly.  RTC 4 mo DM f/u visit.

## 2023-03-13 ENCOUNTER — Other Ambulatory Visit: Payer: Self-pay | Admitting: Family Medicine

## 2023-03-13 DIAGNOSIS — Z794 Long term (current) use of insulin: Secondary | ICD-10-CM

## 2023-03-14 NOTE — Telephone Encounter (Signed)
Plz notify pt - his insurance is no longer covering his Tresiba 200 unit/mL flextouch pens.  I have sent Toujeo Max Solostar insulin pens 300 units/mL to take 110 units daily. Let us know if any trouble filling this.

## 2023-03-16 ENCOUNTER — Other Ambulatory Visit: Payer: Self-pay | Admitting: Family Medicine

## 2023-03-17 ENCOUNTER — Other Ambulatory Visit (HOSPITAL_COMMUNITY): Payer: Self-pay

## 2023-03-22 ENCOUNTER — Other Ambulatory Visit: Payer: Self-pay | Admitting: Family Medicine

## 2023-03-22 DIAGNOSIS — F321 Major depressive disorder, single episode, moderate: Secondary | ICD-10-CM

## 2023-03-22 DIAGNOSIS — K219 Gastro-esophageal reflux disease without esophagitis: Secondary | ICD-10-CM

## 2023-03-22 DIAGNOSIS — M1A0790 Idiopathic chronic gout, unspecified ankle and foot, without tophus (tophi): Secondary | ICD-10-CM

## 2023-03-22 DIAGNOSIS — I10 Essential (primary) hypertension: Secondary | ICD-10-CM

## 2023-03-27 ENCOUNTER — Other Ambulatory Visit: Payer: Self-pay | Admitting: Family Medicine

## 2023-03-27 DIAGNOSIS — J302 Other seasonal allergic rhinitis: Secondary | ICD-10-CM

## 2023-03-29 ENCOUNTER — Telehealth: Payer: Self-pay | Admitting: Family Medicine

## 2023-03-29 DIAGNOSIS — G4733 Obstructive sleep apnea (adult) (pediatric): Secondary | ICD-10-CM

## 2023-03-29 NOTE — Telephone Encounter (Signed)
lvmtcb

## 2023-03-29 NOTE — Telephone Encounter (Signed)
Plz notify - I received message from [employer] CPAP DME supplier/provider  that pt will need follow up office visit between 04/20/2023 and 06/18/2023 to review CPAP use. Please schedule this - may schedule in place of his 07/07/2023 office visit.

## 2023-05-02 ENCOUNTER — Ambulatory Visit (INDEPENDENT_AMBULATORY_CARE_PROVIDER_SITE_OTHER): Payer: Medicare HMO | Admitting: Family Medicine

## 2023-05-02 ENCOUNTER — Encounter: Payer: Self-pay | Admitting: Family Medicine

## 2023-05-02 VITALS — BP 130/84 | HR 82 | Temp 98.3°F | Ht 66.25 in | Wt 245.2 lb

## 2023-05-02 DIAGNOSIS — C61 Malignant neoplasm of prostate: Secondary | ICD-10-CM

## 2023-05-02 DIAGNOSIS — D696 Thrombocytopenia, unspecified: Secondary | ICD-10-CM

## 2023-05-02 DIAGNOSIS — E1169 Type 2 diabetes mellitus with other specified complication: Secondary | ICD-10-CM

## 2023-05-02 DIAGNOSIS — G4733 Obstructive sleep apnea (adult) (pediatric): Secondary | ICD-10-CM | POA: Diagnosis not present

## 2023-05-02 DIAGNOSIS — Z794 Long term (current) use of insulin: Secondary | ICD-10-CM

## 2023-05-02 DIAGNOSIS — R6882 Decreased libido: Secondary | ICD-10-CM | POA: Diagnosis not present

## 2023-05-02 DIAGNOSIS — F321 Major depressive disorder, single episode, moderate: Secondary | ICD-10-CM | POA: Diagnosis not present

## 2023-05-02 DIAGNOSIS — R5383 Other fatigue: Secondary | ICD-10-CM | POA: Diagnosis not present

## 2023-05-02 DIAGNOSIS — E785 Hyperlipidemia, unspecified: Secondary | ICD-10-CM | POA: Diagnosis not present

## 2023-05-02 LAB — CBC WITH DIFFERENTIAL/PLATELET
Basophils Absolute: 0 10*3/uL (ref 0.0–0.1)
Basophils Relative: 0.7 % (ref 0.0–3.0)
Eosinophils Absolute: 0.1 10*3/uL (ref 0.0–0.7)
Eosinophils Relative: 1.4 % (ref 0.0–5.0)
HCT: 45.1 % (ref 39.0–52.0)
Hemoglobin: 14.4 g/dL (ref 13.0–17.0)
Lymphocytes Relative: 19.8 % (ref 12.0–46.0)
Lymphs Abs: 1.3 10*3/uL (ref 0.7–4.0)
MCHC: 31.9 g/dL (ref 30.0–36.0)
MCV: 87 fL (ref 78.0–100.0)
Monocytes Absolute: 0.5 10*3/uL (ref 0.1–1.0)
Monocytes Relative: 6.8 % (ref 3.0–12.0)
Neutro Abs: 4.8 10*3/uL (ref 1.4–7.7)
Neutrophils Relative %: 71.3 % (ref 43.0–77.0)
Platelets: 172 10*3/uL (ref 150.0–400.0)
RBC: 5.19 Mil/uL (ref 4.22–5.81)
RDW: 16.4 % — ABNORMAL HIGH (ref 11.5–15.5)
WBC: 6.7 10*3/uL (ref 4.0–10.5)

## 2023-05-02 LAB — TSH: TSH: 1.01 u[IU]/mL (ref 0.35–5.50)

## 2023-05-02 LAB — POCT GLYCOSYLATED HEMOGLOBIN (HGB A1C): Hemoglobin A1C: 7.7 % — AB (ref 4.0–5.6)

## 2023-05-02 LAB — VITAMIN B12: Vitamin B-12: 319 pg/mL (ref 211–911)

## 2023-05-02 LAB — PSA: PSA: 0.07 ng/mL — ABNORMAL LOW (ref 0.10–4.00)

## 2023-05-02 LAB — TRIGLYCERIDES: Triglycerides: 281 mg/dL — ABNORMAL HIGH (ref 0.0–149.0)

## 2023-05-02 LAB — MAGNESIUM: Magnesium: 1.8 mg/dL (ref 1.5–2.5)

## 2023-05-02 MED ORDER — SILDENAFIL CITRATE 50 MG PO TABS: 50.0000 mg | ORAL_TABLET | Freq: Every day | ORAL | 0 refills | Status: DC | PRN

## 2023-05-02 NOTE — Patient Instructions (Addendum)
We will check on Trulicity affordability  Continue current medicines Labs today  Trial viagra 50mg  daily as needed for relations.  Good to see you today Return in 3-4 months for diabetes follow up visit

## 2023-05-02 NOTE — Assessment & Plan Note (Signed)
Update PSA as he's seeing rad onc next week.

## 2023-05-02 NOTE — Progress Notes (Signed)
Ph: [phone] Fax: [phone]   Patient ID: Colton Porter, male    DOB: 1966/06/16, 57 y.o.   MRN: [id number]  This visit was conducted in person.  BP 130/84   Pulse 82   Temp 98.3 F (36.8 C) (Oral)   Ht 5' 6.25" (1.683 m)   Wt 245 lb 4 oz (111.2 kg)   SpO2 98%   BMI 39.29 kg/m    CC: 2 mo f/u visit  Subjective:   HPI: Colton Porter is a 57 y.o. male presenting on 05/02/2023 for Medical Management of Chronic Issues (Here for 4 mo f/u, uACR and CPAP review. )   Had teeth removed - eventually wants dentures/implants. Limited diet.  He feels flonase is effective - helping him breathing and has decreased frequency of sinus infections. He wears mask when mowing - this also helps.   DM - does not regularly check sugars - out of lancets. Compliant with antihyperglycemic regimen which includes: Jardiance 25mg  daily, Toujeo 110u daily, metformin XR 1000mg  BID. Did not tolerate ozempic, trulicity unaffordable. Consider retrial GLP1RA. Trouble keeping CGM on arm. Denies low sugars or hypoglycemic symptoms. Denies paresthesias, blurry vision. Last diabetic eye exam 12/2022. Glucometer brand: accuchek guide. Last foot exam: 10/2022. DSME: 2019. Lab Results  Component Value Date   HGBA1C 7.7 (A) 05/02/2023   Diabetic Foot Exam - Simple   No data filed    Lab Results  Component Value Date   MICROALBUR 0.8 02/22/2022    OSA on auto CPAP 5-20 cm H2O, without oxygen. DME through [facility]. Last saw pulm 2019, last HST 07/2023. Received new machine/mask through [facility] 02/2023. Needs usage reviewed.  Uses CPAP at least 7-8 hours per night. Sleeping well with this, through the night, wakes up feeling rested.  Equipment info: AirSense 10 Auto with pressure setting 5-20, mask type F20, mask size med  I will request records of CPAP usage from [facility].   Notes lack of libido. Notes ED - trouble obtaining erection. No exertional chest pain. He would be interested in ED  medication.  No depressed mood. Continues dealing with son's death (21-Jun-2022).  Fatigue overall stable - maintaining.  Doesn't like cold weather.  No results found for: "TESTOSTERONE"  Lab Results  Component Value Date   VITAMINB12 630 04/07/2020    Lab Results  Component Value Date   TSH 1.10 04/07/2020        Relevant past medical, surgical, family and social history reviewed and updated as indicated. Interim medical history since our last visit reviewed. Allergies and medications reviewed and updated. Outpatient Medications Prior to Visit  Medication Sig Dispense Refill   Accu-Chek FastClix Lancets MISC Use as instructed to check blood sugar once a day 102 each 3   acetaminophen (TYLENOL) 500 MG tablet Take 500 mg by mouth every 6 (six) hours as needed for mild pain.     allopurinol (ZYLOPRIM) 300 MG tablet Take 1 tablet (300 mg total) by mouth daily. 90 tablet 4   ASPIR-LOW 81 MG EC tablet Take 1 tablet (81 mg total) by mouth daily. 90 tablet 3   B-D UF III MINI PEN NEEDLES 31G X 5 MM MISC USE TO INJECT INSULIN DAILY 100 each 3   Blood Glucose Monitoring Suppl (ACCU-CHEK GUIDE) w/Device KIT 1 Units by Does not apply route as directed. 1 kit 0   Cholecalciferol (VITAMIN D3) 2000 units TABS Take 1 tablet by mouth at bedtime.     docusate sodium (COLACE)  100 MG capsule Take 100 mg by mouth daily as needed for mild constipation.      empagliflozin (JARDIANCE) 25 MG TABS tablet Take 1 tablet (25 mg total) by mouth daily. 90 tablet 4   escitalopram (LEXAPRO) 10 MG tablet Take 1 tablet (10 mg total) by mouth daily. 90 tablet 4   fluticasone (FLONASE) 50 MCG/ACT nasal spray SPRAY 2 SPRAYS INTO EACH NOSTRIL EVERY DAY 48 mL 2   furosemide (LASIX) 40 MG tablet Take 1 tablet (40 mg total) by mouth daily. 90 tablet 2   glucose blood (ACCU-CHEK GUIDE) test strip Use as instructed to check blood sugar once a day 100 each 3   insulin glargine, 2 Unit Dial, (TOUJEO MAX SOLOSTAR) 300 UNIT/ML  Solostar Pen Inject 110 Units into the skin daily. 45 mL 1   loratadine (CLARITIN) 10 MG tablet Take 10 mg by mouth daily.     losartan (COZAAR) 50 MG tablet Take 1 tablet (50 mg total) by mouth daily. 90 tablet 4   lovastatin (MEVACOR) 20 MG tablet Take 1 tablet (20 mg total) by mouth at bedtime. 90 tablet 4   magnesium oxide (MAG-OX) 400 (240 Mg) MG tablet TAKE 1 TABLET BY MOUTH TWICE A DAY 180 tablet 1   metFORMIN (GLUCOPHAGE-XR) 500 MG 24 hr tablet Take 2 tablets (1,000 mg total) by mouth 2 (two) times daily with a meal. 360 tablet 4   Multiple Vitamins-Minerals ([facility] SPECTRAVITE PO) Take 1 tablet by mouth at bedtime.     mycophenolate (MYFORTIC) 360 MG TBEC EC tablet Take 360 mg by mouth 2 (two) times daily.     Omega-3 Fatty Acids (FISH OIL PO) Take 1 tablet by mouth at bedtime.     omeprazole (PRILOSEC) 40 MG capsule Take 1 capsule (40 mg total) by mouth daily. 90 capsule 4   potassium chloride (KLOR-CON M) 10 MEQ tablet Take 1 tablet (10 mEq total) by mouth daily. 90 tablet 3   tacrolimus (PROGRAF) 1 MG capsule Take 1-2 mg by mouth See admin instructions. Take 2 mg by mouth in the morning, then take 1 mg  by mouth in the evening     triamcinolone cream (KENALOG) 0.1 % Apply 1 Application topically 2 (two) times daily. Apply to AA. 80 g 0   No facility-administered medications prior to visit.     Per HPI unless specifically indicated in ROS section below Review of Systems  Objective:  BP 130/84   Pulse 82   Temp 98.3 F (36.8 C) (Oral)   Ht 5' 6.25" (1.683 m)   Wt 245 lb 4 oz (111.2 kg)   SpO2 98%   BMI 39.29 kg/m   Wt Readings from Last 3 Encounters:  05/02/23 245 lb 4 oz (111.2 kg)  03/07/23 249 lb 4 oz (113.1 kg)  11/23/22 252 lb 6 oz (114.5 kg)      Physical Exam Vitals and nursing note reviewed.  Constitutional:      Appearance: Normal appearance. He is not ill-appearing.  HENT:     Head: Normocephalic and atraumatic.  Eyes:     Extraocular Movements: Extraocular  movements intact.     Conjunctiva/sclera: Conjunctivae normal.     Pupils: Pupils are equal, round, and reactive to light.  Cardiovascular:     Rate and Rhythm: Normal rate and regular rhythm.     Pulses: Normal pulses.     Heart sounds: Normal heart sounds. No murmur heard. Pulmonary:     Effort: Pulmonary effort is normal.  No respiratory distress.     Breath sounds: Normal breath sounds. No wheezing, rhonchi or rales.  Musculoskeletal:     Right lower leg: No edema.     Left lower leg: No edema.     Comments: See HPI for foot exam if done  Skin:    General: Skin is warm and dry.     Findings: No rash.  Neurological:     Mental Status: He is alert.  Psychiatric:        Mood and Affect: Mood normal.        Behavior: Behavior normal.       Results for orders placed or performed in visit on 05/02/23  POCT glycosylated hemoglobin (Hb A1C)  Result Value Ref Range   Hemoglobin A1C 7.7 (A) 4.0 - 5.6 %   HbA1c POC (<> result, manual entry)     HbA1c, POC (prediabetic range)     HbA1c, POC (controlled diabetic range)      Assessment & Plan:   Problem List Items Addressed This Visit     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 Trig elevated - update this as fasting today       Relevant Orders   Triglycerides   Type 2 diabetes mellitus with other specified complication (HCC) - Primary (Chronic)    Chronic, remains uncontrolled. He did run out of Jardiance for a while due to difficulty filling at [facility], but he is now back on this. Continues metformin and toujeo 110u daily.  He's not been checking sugars at home. I will see if he has any options available (PAP) to be able to afford Trulicity.       Relevant Orders   POCT glycosylated hemoglobin (Hb A1C) (Completed)   Thrombocytopenia (HCC)   Relevant Orders   CBC with Differential/Platelet   OSA on CPAP    Chronic OSA on CPAP - not using nightly  oxygen.  Using 7-8 hours /night per pt report, feels this is effective, he benefits from this.  I will request CPAP usage records from [facility] to review.       Hypomagnesemia    Update Mg on 400mg  daily replacement. He is on daily PPI      Relevant Orders   Magnesium   Depression, major, single episode, moderate (HCC)    Stable period on lexapro 10mg daily. Coming up on 1 year anniversary of son's death. Also notes trouble with winter weather. Discussed trying lower lexapro dose however will not make changes at this time given.       Prostate cancer (HCC)    Update PSA as he's seeing rad onc next week.       Relevant Orders   PSA   Decreased libido    Notes chronic decreased libido with ED - trouble obtaining erection.  Desires trial of Viagra. He can start with 50 mg dose, and increase to 100 mg if necessary. The method of use 1 hour prior to anticipated intercourse is explained. He should not use any more than one tablet in a 24 hour period. The side effects of possible headache, flushing, dyspepsia have been explained. Advised to seek urgent medical care if he develops priapism. The patient is not taking nitrates. I have counseled him that taking Viagra with nitrates of any form can cause life threatening drops in blood pressure.       Other Visit Diagnoses     Low  energy       Relevant Orders   TSH   Vitamin B12        Meds ordered this encounter  Medications   sildenafil (VIAGRA) 50 MG tablet    Sig: Take 1 tablet (50 mg total) by mouth daily as needed for erectile dysfunction.    Dispense:  10 tablet    Refill:  0    Orders Placed This Encounter  Procedures   TSH   CBC with Differential/Platelet   Vitamin B12   PSA   Triglycerides   Magnesium   POCT glycosylated hemoglobin (Hb A1C)    Patient Instructions  We will check on Trulicity affordability  Continue current medicines Labs today  Trial viagra 50mg  daily as needed for relations.  Good to see  you today Return in 3-4 months for diabetes follow up visit   Follow up plan: Return in about 3 months (around 08/02/2023), or if symptoms worsen or fail to improve, for follow up visit.  Eustaquio Boyden, MD

## 2023-05-02 NOTE — Assessment & Plan Note (Addendum)
Stable period on lexapro 10mg daily. Coming up on 1 year anniversary of son's death. Also notes trouble with winter weather. Discussed trying lower lexapro dose however will not make changes at this time given.

## 2023-05-02 NOTE — Assessment & Plan Note (Signed)
Notes chronic decreased libido with ED - trouble obtaining erection.  Desires trial of Viagra. He can start with 50 mg dose, and increase to 100 mg if necessary. The method of use 1 hour prior to anticipated intercourse is explained. He should not use any more than one tablet in a 24 hour period. The side effects of possible headache, flushing, dyspepsia have been explained. Advised to seek urgent medical care if he develops priapism. The patient is not taking nitrates. I have counseled him that taking Viagra with nitrates of any form can cause life threatening drops in blood pressure.

## 2023-05-02 NOTE — Assessment & Plan Note (Signed)
Chronic, LDL at goal on lovastatin. Trig elevated - update this as fasting today

## 2023-05-02 NOTE — Assessment & Plan Note (Signed)
Update Mg on 400mg  daily replacement. He is on daily PPI

## 2023-05-02 NOTE — Assessment & Plan Note (Addendum)
Chronic, remains uncontrolled. He did run out of Jardiance for a while due to difficulty filling at [facility], but he is now back on this. Continues metformin and toujeo 110u daily.  He's not been checking sugars at home. I will see if he has any options available (PAP) to be able to afford Trulicity.

## 2023-05-02 NOTE — Assessment & Plan Note (Signed)
Chronic OSA on CPAP - not using nightly oxygen.  Using 7-8 hours /night per pt report, feels this is effective, he benefits from this.  I will request CPAP usage records from [facility] to review.

## 2023-05-04 ENCOUNTER — Other Ambulatory Visit: Payer: Self-pay | Admitting: Family Medicine

## 2023-05-04 ENCOUNTER — Inpatient Hospital Stay: Payer: Medicare HMO

## 2023-05-04 DIAGNOSIS — E1169 Type 2 diabetes mellitus with other specified complication: Secondary | ICD-10-CM

## 2023-05-05 NOTE — Telephone Encounter (Signed)
ERx Plz verify with pt - where does he want this sent [facility]? It was previously sent to [facility]

## 2023-05-06 ENCOUNTER — Other Ambulatory Visit: Payer: Self-pay | Admitting: Family Medicine

## 2023-05-06 MED ORDER — CYANOCOBALAMIN 500 MCG PO TABS: 500.0000 ug | ORAL_TABLET | Freq: Every day | ORAL | Status: AC

## 2023-05-11 ENCOUNTER — Other Ambulatory Visit: Payer: Self-pay | Admitting: *Deleted

## 2023-05-11 ENCOUNTER — Ambulatory Visit
Admission: RE | Admit: 2023-05-11 | Discharge: 2023-05-11 | Disposition: A | Discharge status: Home / Self Care | Payer: Medicare HMO | Source: Ambulatory Visit | Attending: Radiation Oncology | Admitting: Radiation Oncology

## 2023-05-11 ENCOUNTER — Encounter: Payer: Self-pay | Admitting: Radiation Oncology

## 2023-05-11 VITALS — BP 136/82 | HR 87 | Temp 97.6°F | Resp 18 | Ht 66.25 in | Wt 245.0 lb

## 2023-05-11 DIAGNOSIS — N529 Male erectile dysfunction, unspecified: Secondary | ICD-10-CM | POA: Diagnosis not present

## 2023-05-11 DIAGNOSIS — Z923 Personal history of irradiation: Secondary | ICD-10-CM | POA: Insufficient documentation

## 2023-05-11 DIAGNOSIS — C61 Malignant neoplasm of prostate: Secondary | ICD-10-CM | POA: Insufficient documentation

## 2023-05-11 DIAGNOSIS — Z191 Hormone sensitive malignancy status: Secondary | ICD-10-CM | POA: Diagnosis not present

## 2023-05-11 NOTE — Progress Notes (Signed)
Radiation Oncology Follow up Note  Name: Colton Porter   Date:   05/11/2023 MRN:  [id number] DOB: 1966/03/18    This 57 y.o. 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 He reports excellent biochemical control of his prostate cancer with a most recent PSA of 0.07. He is currently experiencing erectile dysfunction despite the use of Viagra. He has tried the medication once without success and has ten pills remaining. He is scheduled to see his urologist in May..  COMPLICATIONS OF TREATMENT: none  FOLLOW UP COMPLIANCE: keeps appointments   PHYSICAL EXAM:  BP 136/82   Pulse 87   Temp 97.6 F (36.4 C)   Resp 18   Ht 5' 6.25" (1.683 m)   Wt 245 lb (111.1 kg)   BMI 39.25 kg/m  Well-developed well-nourished patient in NAD. HEENT reveals PERLA, EOMI, discs not visualized.  Oral cavity is clear. No oral mucosal lesions are identified. Neck is clear without evidence of cervical or supraclavicular adenopathy. Lungs are clear to A&P. Cardiac examination is essentially unremarkable with regular rate and rhythm without murmur rub or thrill. Abdomen is benign with no organomegaly or masses noted. Motor sensory and DTR levels are equal and symmetric in the upper and lower extremities. Cranial nerves II through XII are grossly intact. Proprioception is intact. No peripheral adenopathy or edema is identified. No motor or sensory levels are noted. Crude visual fields are within normal range.  RADIOLOGY RESULTS: No current films for review  PLAN: Prostate Adenocarcinoma (Stage 2B, Gleason 7 (3+4)) Excellent biochemical control with recent PSA of 0.07, two years post  IMRT radiation therapy. -Continue current management and follow-up with urology in May.  Erectile Dysfunction Patient reports Viagra is ineffective. -Try half a pill of Viagra weekly. -Discuss further management options with urology in May.  Follow-up in one year. After that, care will be transitioned to urology.    Carmina Miller, MD

## 2023-06-08 DIAGNOSIS — D849 Immunodeficiency, unspecified: Secondary | ICD-10-CM | POA: Diagnosis not present

## 2023-06-08 DIAGNOSIS — K760 Fatty (change of) liver, not elsewhere classified: Secondary | ICD-10-CM | POA: Diagnosis not present

## 2023-06-08 DIAGNOSIS — Z944 Liver transplant status: Secondary | ICD-10-CM | POA: Diagnosis not present

## 2023-06-28 ENCOUNTER — Other Ambulatory Visit: Payer: Self-pay | Admitting: Physician Assistant

## 2023-07-07 ENCOUNTER — Ambulatory Visit: Payer: Medicare HMO | Admitting: Family Medicine

## 2023-07-31 ENCOUNTER — Encounter: Payer: Self-pay | Admitting: Family Medicine

## 2023-07-31 ENCOUNTER — Telehealth (INDEPENDENT_AMBULATORY_CARE_PROVIDER_SITE_OTHER): Payer: Medicare HMO | Admitting: Family Medicine

## 2023-07-31 ENCOUNTER — Telehealth: Payer: Medicare HMO | Admitting: Family Medicine

## 2023-07-31 VITALS — Ht 66.25 in | Wt 245.0 lb

## 2023-07-31 DIAGNOSIS — Z794 Long term (current) use of insulin: Secondary | ICD-10-CM | POA: Diagnosis not present

## 2023-07-31 DIAGNOSIS — I5032 Chronic diastolic (congestive) heart failure: Secondary | ICD-10-CM

## 2023-07-31 DIAGNOSIS — Z944 Liver transplant status: Secondary | ICD-10-CM

## 2023-07-31 DIAGNOSIS — J101 Influenza due to other identified influenza virus with other respiratory manifestations: Secondary | ICD-10-CM | POA: Insufficient documentation

## 2023-07-31 DIAGNOSIS — E1169 Type 2 diabetes mellitus with other specified complication: Secondary | ICD-10-CM

## 2023-07-31 DIAGNOSIS — D849 Immunodeficiency, unspecified: Secondary | ICD-10-CM | POA: Diagnosis not present

## 2023-07-31 HISTORY — DX: Influenza due to other identified influenza virus with other respiratory manifestations: J10.1

## 2023-07-31 MED ORDER — OSELTAMIVIR PHOSPHATE 75 MG PO CAPS: 75.0000 mg | ORAL_CAPSULE | Freq: Two times a day (BID) | ORAL | 0 refills | Status: DC

## 2023-07-31 NOTE — Telephone Encounter (Signed)
Spoke with pt offering video visit with Dr Patsy Lager today at 11:40. Pt accepted. States he did a home Covid/Flu test. Covid- neg; pos flu A. Sxs started 07/27/23.

## 2023-07-31 NOTE — Telephone Encounter (Addendum)
Seen virtually today.  

## 2023-07-31 NOTE — Telephone Encounter (Signed)
Pt has virtual visit today with Dr Patsy Lager at 4:00.

## 2023-07-31 NOTE — Assessment & Plan Note (Signed)
4d into illness however given comorbidities do recommend tamiflu treatment -Rx to pharmacy, reviewing possible side effects to watch for.  Push fluids and rest, ok to continue tylenol and mucinex  Update if not improving with treatment.  Ok to keep f/u appt scheduled Wed this week.

## 2023-07-31 NOTE — Progress Notes (Signed)
Ph: [phone] Fax: [phone]   Patient ID: Colton Porter, male    DOB: Jan 11, 1966, 58 y.o.   MRN: [id number]  Virtual visit completed through MyChart, a video enabled telemedicine application. Due to national recommendations of social distancing due to COVID-19, a virtual visit is felt to be most appropriate for this patient at this time. Reviewed limitations, risks, security and privacy concerns of performing a virtual visit and the availability of in person appointments. I also reviewed that there may be a patient responsible charge related to this service. The patient agreed to proceed.   Patient location: home Provider location: [place] at [facility] office Persons participating in this virtual visit: patient, provider   If any vitals were documented, they were collected by patient at home unless specified below.    Ht 5' 6.25" (1.683 m)   Wt 245 lb (111.1 kg)   BMI 39.25 kg/m    CC: influenza  Subjective:   HPI: Colton Porter is a 58 y.o. male presenting on 07/31/2023 for Influenza (C/o cough, HA, body aches and chills. Sxs started 07/27/23. Pos flu A home test- 07/29/23. Neg home Covid test. 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  Lab Results  Component Value Date   HGBA1C 7.7 (A) 05/02/2023   First day of symptoms: 07/27/2023 Tested positive for influenza at home on 07/29/2023 Tested negative for COVID.  Symptoms include headache, cough productive of mucous, body aches and chills of sudden onset "hit with a ton of bricks". Today started having diarrhea.   No dyspnea or chest pain. Some wheezing.   No sick contacts at home.   Tried tylenol Saturday, mucinex as well - without significant benefit.      Relevant past medical, surgical, family and social history reviewed and updated as indicated. Interim medical history since our last visit  reviewed. Allergies and medications reviewed and updated. Outpatient Medications Prior to Visit  Medication Sig Dispense Refill   Accu-Chek FastClix Lancets MISC Use as instructed to check blood sugar once a day 102 each 3   acetaminophen (TYLENOL) 500 MG tablet Take 500 mg by mouth every 6 (six) hours as needed for mild pain.     allopurinol (ZYLOPRIM) 300 MG tablet Take 1 tablet (300 mg total) by mouth daily. 90 tablet 4   ASPIR-LOW 81 MG EC tablet Take 1 tablet (81 mg total) by mouth daily. 90 tablet 3   B-D UF III MINI PEN NEEDLES 31G X 5 MM MISC USE TO INJECT INSULIN DAILY 100 each 3   Blood Glucose Monitoring Suppl (ACCU-CHEK GUIDE) w/Device KIT 1 Units by Does not apply route as directed. 1 kit 0   Cholecalciferol (VITAMIN D3) 2000 units TABS Take 1 tablet by mouth at bedtime.     cyanocobalamin (VITAMIN B12) 500 MCG tablet Take 1 tablet (500 mcg total) by mouth daily.     docusate sodium (COLACE) 100 MG capsule Take 100 mg by mouth daily as needed for mild constipation.      empagliflozin (JARDIANCE) 25 MG TABS tablet Take 1 tablet (25 mg total) by mouth daily. 90 tablet 4   escitalopram (LEXAPRO) 10 MG tablet Take 1 tablet (10 mg total) by mouth daily. 90 tablet 4   fluticasone (FLONASE) 50 MCG/ACT nasal spray SPRAY 2 SPRAYS INTO EACH NOSTRIL EVERY DAY 48 mL 2   furosemide (LASIX) 40 MG tablet Take 1 tablet (40 mg  total) by mouth daily. 90 tablet 2   glucose blood (ACCU-CHEK GUIDE) test strip Use as instructed to check blood sugar once a day 100 each 3   insulin glargine, 2 Unit Dial, (TOUJEO MAX SOLOSTAR) 300 UNIT/ML Solostar Pen Inject 110 Units into the skin daily. 45 mL 1   loratadine (CLARITIN) 10 MG tablet Take 10 mg by mouth daily.     losartan (COZAAR) 50 MG tablet Take 1 tablet (50 mg total) by mouth daily. 90 tablet 4   lovastatin (MEVACOR) 20 MG tablet Take 1 tablet (20 mg total) by mouth at bedtime. 90 tablet 4   magnesium oxide (MAG-OX) 400 (240 Mg) MG tablet TAKE 1 TABLET  BY MOUTH TWICE A DAY 180 tablet 1   metFORMIN (GLUCOPHAGE-XR) 500 MG 24 hr tablet TAKE 2 TABLETS BY MOUTH 2 TIMES DAILY WITH A MEAL. 360 tablet 3   Multiple Vitamins-Minerals ([facility] SPECTRAVITE PO) Take 1 tablet by mouth at bedtime.     mycophenolate (MYFORTIC) 360 MG TBEC EC tablet Take 360 mg by mouth 2 (two) times daily.     Omega-3 Fatty Acids (FISH OIL PO) Take 1 tablet by mouth at bedtime.     omeprazole (PRILOSEC) 40 MG capsule Take 1 capsule (40 mg total) by mouth daily. 90 capsule 4   potassium chloride (KLOR-CON M) 10 MEQ tablet Take 1 tablet (10 mEq total) by mouth daily. 90 tablet 3   sildenafil (VIAGRA) 50 MG tablet Take 1 tablet (50 mg total) by mouth daily as needed for erectile dysfunction. 10 tablet 0   tacrolimus (PROGRAF) 1 MG capsule Take 1-2 mg by mouth See admin instructions. Take 2 mg by mouth in the morning, then take 1 mg  by mouth in the evening     triamcinolone cream (KENALOG) 0.1 % Apply 1 Application topically 2 (two) times daily. Apply to AA. 80 g 0   No facility-administered medications prior to visit.     Per HPI unless specifically indicated in ROS section below Review of Systems Objective:  Ht 5' 6.25" (1.683 m)   Wt 245 lb (111.1 kg)   BMI 39.25 kg/m   Wt Readings from Last 3 Encounters:  07/31/23 245 lb (111.1 kg)  05/11/23 245 lb (111.1 kg)  05/02/23 245 lb 4 oz (111.2 kg)       Physical exam: Gen: alert, NAD, not ill appearing Pulm: speaks in complete sentences without increased work of breathing, intermittent cough present Psych: normal mood, normal thought content      Assessment & Plan:   Influenza A Assessment & Plan: 4d into illness however given comorbidities do recommend tamiflu treatment -Rx to pharmacy, reviewing possible side effects to watch for.  Push fluids and rest, ok to continue tylenol and mucinex  Update if not improving with treatment.  Ok to keep f/u appt scheduled Wed this week.    Type 2 diabetes mellitus with  other specified complication, with long-term current use of insulin (HCC)  Status post liver transplant (HCC)  Chronic heart failure with preserved ejection fraction (HCC)  Immunosuppressed status (HCC)  Other orders -     Oseltamivir Phosphate; Take 1 capsule (75 mg total) by mouth 2 (two) times daily.  Dispense: 10 capsule; Refill: 0     I discussed the assessment and treatment plan with the patient. The patient was provided an opportunity to ask questions and all were answered. The patient agreed with the plan and demonstrated an understanding of the instructions. The patient was  advised to call back or seek an in-person evaluation if the symptoms worsen or if the condition fails to improve as anticipated.  Follow up plan: No follow-ups on file.  Eustaquio Boyden, MD

## 2023-08-02 ENCOUNTER — Encounter: Payer: Self-pay | Admitting: Family Medicine

## 2023-08-02 ENCOUNTER — Ambulatory Visit (INDEPENDENT_AMBULATORY_CARE_PROVIDER_SITE_OTHER): Payer: Medicare HMO | Admitting: Family Medicine

## 2023-08-02 VITALS — BP 128/74 | HR 84 | Temp 97.9°F | Ht 66.25 in | Wt 238.5 lb

## 2023-08-02 DIAGNOSIS — E1169 Type 2 diabetes mellitus with other specified complication: Secondary | ICD-10-CM

## 2023-08-02 DIAGNOSIS — Z944 Liver transplant status: Secondary | ICD-10-CM

## 2023-08-02 DIAGNOSIS — Z794 Long term (current) use of insulin: Secondary | ICD-10-CM | POA: Diagnosis not present

## 2023-08-02 DIAGNOSIS — D849 Immunodeficiency, unspecified: Secondary | ICD-10-CM | POA: Diagnosis not present

## 2023-08-02 DIAGNOSIS — J101 Influenza due to other identified influenza virus with other respiratory manifestations: Secondary | ICD-10-CM

## 2023-08-02 LAB — MICROALBUMIN / CREATININE URINE RATIO
Creatinine,U: 129.3 mg/dL
Microalb Creat Ratio: 2.4 mg/g (ref 0.0–30.0)
Microalb, Ur: 3.1 mg/dL — ABNORMAL HIGH (ref 0.0–1.9)

## 2023-08-02 LAB — POCT GLYCOSYLATED HEMOGLOBIN (HGB A1C): Hemoglobin A1C: 6.6 % — AB (ref 4.0–5.6)

## 2023-08-02 MED ORDER — BENZONATATE 100 MG PO CAPS: 100.0000 mg | ORAL_CAPSULE | Freq: Two times a day (BID) | ORAL | 0 refills | Status: DC | PRN

## 2023-08-02 NOTE — Assessment & Plan Note (Signed)
 Regularly sees liver clinic.

## 2023-08-02 NOTE — Progress Notes (Signed)
 Ph: [phone] Fax: [phone]   Patient ID: Colton Porter Stain, male    DOB: 1965-09-26, 58 y.o.   MRN: [id number]  This visit was conducted in person.  BP 128/74   Pulse 84   Temp 97.9 F (36.6 C) (Oral)   Ht 5' 6.25 (1.683 m)   Wt 238 lb 8 oz (108.2 kg)   SpO2 94%   BMI 38.20 kg/m    CC: 3 mo DM f/u visit  Subjective:   HPI: Colton Porter is a 58 y.o. male presenting on 08/02/2023 for Medical Management of Chronic Issues (Here for 3 mo DM f/u. Also, recent flu A dx- c/o ongoing cough- disturbing sleep. )   New lower dentures in place. Considering implants.   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   Recent influenza illness - started tamiflu  on Monday - still feeling rough. Body aches, diarrhea, cough, congestion, headache, no recent fever or ST. Some chills.   DM - does not regularly check sugars. Compliant with antihyperglycemic regimen which includes: jardiance  25mg  daily, toujeo  110u daily, metformin  XR 1000mg  bid. Trouble tolerating ozempic . Denies low sugars or hypoglycemic symptoms. Denies blurry vision. Chronic paresthesias to feet. Last diabetic eye exam 12/2022. Glucometer brand: accuchek guide. Last foot exam: 10/2022. DSME: completed 2019 at [facility]  Trouble keeping Freestyle meter on.  Lab Results  Component Value Date   HGBA1C 6.6 (A) 08/02/2023   Diabetic Foot Exam - Simple   No data filed    Lab Results  Component Value Date   MICROALBUR 0.8 02/22/2022         Relevant past medical, surgical, family and social history reviewed and updated as indicated. Interim medical history since our last visit reviewed. Allergies and medications reviewed and updated. Outpatient Medications Prior to Visit  Medication Sig Dispense Refill   Accu-Chek FastClix Lancets MISC Use as instructed to check blood sugar once a day 102 each 3   acetaminophen   (TYLENOL ) 500 MG tablet Take 500 mg by mouth every 6 (six) hours as needed for mild pain.     allopurinol  (ZYLOPRIM ) 300 MG tablet Take 1 tablet (300 mg total) by mouth daily. 90 tablet 4   ASPIR-LOW 81 MG EC tablet Take 1 tablet (81 mg total) by mouth daily. 90 tablet 3   B-D UF III MINI PEN NEEDLES 31G X 5 MM MISC USE TO INJECT INSULIN  DAILY 100 each 3   Blood Glucose Monitoring Suppl (ACCU-CHEK GUIDE) w/Device KIT 1 Units by Does not apply route as directed. 1 kit 0   Cholecalciferol (VITAMIN D3) 2000 units TABS Take 1 tablet by mouth at bedtime.     cyanocobalamin  (VITAMIN B12) 500 MCG tablet Take 1 tablet (500 mcg total) by mouth daily.     docusate sodium (COLACE) 100 MG capsule Take 100 mg by mouth daily as needed for mild constipation.      empagliflozin  (JARDIANCE ) 25 MG TABS tablet Take 1 tablet (25 mg total) by mouth daily. 90 tablet 4   escitalopram  (LEXAPRO ) 10 MG tablet Take 1 tablet (10 mg total) by mouth daily. 90 tablet 4   fluticasone  (FLONASE ) 50 MCG/ACT nasal spray SPRAY 2 SPRAYS INTO EACH NOSTRIL EVERY DAY 48 mL 2   furosemide  (LASIX ) 40 MG tablet Take 1 tablet (40 mg total) by mouth daily. 90 tablet 2   glucose blood (ACCU-CHEK GUIDE) test strip Use as instructed to check blood sugar once  a day 100 each 3   insulin  glargine, 2 Unit Dial, (TOUJEO  MAX SOLOSTAR) 300 UNIT/ML Solostar Pen Inject 110 Units into the skin daily. 45 mL 1   loratadine  (CLARITIN ) 10 MG tablet Take 10 mg by mouth daily.     losartan  (COZAAR ) 50 MG tablet Take 1 tablet (50 mg total) by mouth daily. 90 tablet 4   lovastatin  (MEVACOR ) 20 MG tablet Take 1 tablet (20 mg total) by mouth at bedtime. 90 tablet 4   magnesium  oxide (MAG-OX) 400 (240 Mg) MG tablet TAKE 1 TABLET BY MOUTH TWICE A DAY 180 tablet 1   metFORMIN  (GLUCOPHAGE -XR) 500 MG 24 hr tablet TAKE 2 TABLETS BY MOUTH 2 TIMES DAILY WITH A MEAL. 360 tablet 3   Multiple Vitamins-Minerals ([facility] SPECTRAVITE PO) Take 1 tablet by mouth at bedtime.      mycophenolate (MYFORTIC) 360 MG TBEC EC tablet Take 360 mg by mouth 2 (two) times daily.     Omega-3 Fatty Acids (FISH OIL  PO) Take 1 tablet by mouth at bedtime.     omeprazole  (PRILOSEC) 40 MG capsule Take 1 capsule (40 mg total) by mouth daily. 90 capsule 4   oseltamivir  (TAMIFLU ) 75 MG capsule Take 1 capsule (75 mg total) by mouth 2 (two) times daily. 10 capsule 0   potassium chloride  (KLOR-CON  M) 10 MEQ tablet Take 1 tablet (10 mEq total) by mouth daily. 90 tablet 3   sildenafil  (VIAGRA ) 50 MG tablet Take 1 tablet (50 mg total) by mouth daily as needed for erectile dysfunction. 10 tablet 0   tacrolimus  (PROGRAF ) 1 MG capsule Take 1-2 mg by mouth See admin instructions. Take 2 mg by mouth in the morning, then take 1 mg  by mouth in the evening     triamcinolone  cream (KENALOG ) 0.1 % Apply 1 Application topically 2 (two) times daily. Apply to AA. 80 g 0   No facility-administered medications prior to visit.     Per HPI unless specifically indicated in ROS section below Review of Systems  Objective:  BP 128/74   Pulse 84   Temp 97.9 F (36.6 C) (Oral)   Ht 5' 6.25 (1.683 m)   Wt 238 lb 8 oz (108.2 kg)   SpO2 94%   BMI 38.20 kg/m   Wt Readings from Last 3 Encounters:  08/02/23 238 lb 8 oz (108.2 kg)  07/31/23 245 lb (111.1 kg)  05/11/23 245 lb (111.1 kg)      Physical Exam Vitals and nursing note reviewed.  Constitutional:      Appearance: Normal appearance. He is not ill-appearing.  HENT:     Head: Normocephalic and atraumatic.     Right Ear: Tympanic membrane, ear canal and external ear normal. There is no impacted cerumen.     Left Ear: Tympanic membrane, ear canal and external ear normal. There is no impacted cerumen.     Mouth/Throat:     Comments: Wearing mask Eyes:     Extraocular Movements: Extraocular movements intact.     Conjunctiva/sclera: Conjunctivae normal.     Pupils: Pupils are equal, round, and reactive to light.  Cardiovascular:     Rate and Rhythm:  Normal rate and regular rhythm.     Pulses: Normal pulses.     Heart sounds: Normal heart sounds. No murmur heard. Pulmonary:     Effort: Pulmonary effort is normal. No respiratory distress.     Breath sounds: Rhonchi (faint RLL) present. No wheezing or rales.  Musculoskeletal:  Right lower leg: No edema.     Left lower leg: No edema.     Comments: See HPI for foot exam if done  Skin:    General: Skin is warm and dry.     Findings: No rash.  Neurological:     Mental Status: He is alert.  Psychiatric:        Mood and Affect: Mood normal.        Behavior: Behavior normal.       Results for orders placed or performed in visit on 08/02/23  POCT glycosylated hemoglobin (Hb A1C)   Collection Time: 08/02/23  8:38 AM  Result Value Ref Range   Hemoglobin A1C 6.6 (A) 4.0 - 5.6 %   HbA1c POC (<> result, manual entry)     HbA1c, POC (prediabetic range)     HbA1c, POC (controlled diabetic range)      Assessment & Plan:   Problem List Items Addressed This Visit     Type 2 diabetes mellitus with other specified complication (HCC) - Primary (Chronic)   Chronic, congratulated on improved control on current regimen - continue.  Latest A1c today 6.6%       Relevant Orders   POCT glycosylated hemoglobin (Hb A1C) (Completed)   Microalbumin / creatinine urine ratio   Status post liver transplant (HCC) (Chronic)   Regularly sees liver clinic.       Immunosuppressed status (HCC) (Chronic)   On tacrolimus , myfortic      Severe obesity (BMI 35.0-39.9) with comorbidity (HCC)   Congratulated on weight loss noted.  Obesity complicated by diabetes.       Influenza A   On day 3 of tamiflu , symptoms plateauing.  Continue supportive care at home, finish tamiflu . No pneumonia on exam today.  Rx tessalon  perls for cough         Meds ordered this encounter  Medications   benzonatate  (TESSALON ) 100 MG capsule    Sig: Take 1 capsule (100 mg total) by mouth 2 (two) times daily as  needed for cough.    Dispense:  30 capsule    Refill:  0    Orders Placed This Encounter  Procedures   Microalbumin / creatinine urine ratio   POCT glycosylated hemoglobin (Hb A1C)    Patient Instructions  A1c today  Urine protein test today  Continue current medicines. Finish up Tamiflu . May try tessalon  perls for cough.  Let us  know if any fever >101 or worsening productive cough.   Follow up plan: Return in about 31 weeks (around 03/06/2024), or if symptoms worsen or fail to improve, for annual exam, prior fasting for blood work, medicare wellness visit.  Anton Blas, MD

## 2023-08-02 NOTE — Patient Instructions (Addendum)
 A1c today  Urine protein test today  Continue current medicines. Finish up Tamiflu . May try tessalon  perls for cough.  Let us  know if any fever >101 or worsening productive cough.

## 2023-08-02 NOTE — Assessment & Plan Note (Addendum)
Chronic, congratulated on improved control on current regimen - continue.  Latest A1c today 6.6%

## 2023-08-02 NOTE — Assessment & Plan Note (Signed)
 Congratulated on weight loss noted.  Obesity complicated by diabetes.

## 2023-08-02 NOTE — Assessment & Plan Note (Signed)
 On tacrolimus , myfortic

## 2023-08-02 NOTE — Assessment & Plan Note (Addendum)
 On day 3 of tamiflu , symptoms plateauing.  Continue supportive care at home, finish tamiflu . No pneumonia on exam today.  Rx tessalon  perls for cough

## 2023-08-03 ENCOUNTER — Encounter: Payer: Self-pay | Admitting: Family Medicine

## 2023-09-11 ENCOUNTER — Encounter: Payer: Self-pay | Admitting: Family Medicine

## 2023-09-11 DIAGNOSIS — Z1211 Encounter for screening for malignant neoplasm of colon: Secondary | ICD-10-CM

## 2023-09-14 ENCOUNTER — Other Ambulatory Visit: Payer: Self-pay | Admitting: Family Medicine

## 2023-09-15 NOTE — Telephone Encounter (Signed)
 Magnesium oxide Last filled:  06/16/23, #180 Last OV:  08/02/23, 3 mo DM f/u Next OV:  03/08/24, CPE

## 2023-10-06 ENCOUNTER — Encounter: Payer: Self-pay | Admitting: Gastroenterology

## 2023-10-20 ENCOUNTER — Other Ambulatory Visit: Payer: Self-pay | Admitting: Physician Assistant

## 2023-10-26 ENCOUNTER — Other Ambulatory Visit

## 2023-10-26 DIAGNOSIS — C61 Malignant neoplasm of prostate: Secondary | ICD-10-CM

## 2023-10-26 DIAGNOSIS — R972 Elevated prostate specific antigen [PSA]: Secondary | ICD-10-CM

## 2023-10-27 ENCOUNTER — Other Ambulatory Visit: Payer: Self-pay

## 2023-10-27 ENCOUNTER — Encounter: Payer: Self-pay | Admitting: Family Medicine

## 2023-10-27 DIAGNOSIS — E1169 Type 2 diabetes mellitus with other specified complication: Secondary | ICD-10-CM

## 2023-10-27 DIAGNOSIS — I5032 Chronic diastolic (congestive) heart failure: Secondary | ICD-10-CM

## 2023-10-27 LAB — PSA: Prostate Specific Ag, Serum: <0.1 ng/mL (ref 0.0–4.0)

## 2023-10-27 MED ORDER — EMPAGLIFLOZIN 25 MG PO TABS
25.0000 mg | ORAL_TABLET | Freq: Every day | ORAL | 1 refills | Status: DC
Start: 2023-10-27 — End: 2024-03-08

## 2023-10-27 NOTE — Telephone Encounter (Signed)
 Rx sent on 03/07/23, #90/4 refills to [facility] mail order pharmacy.   Spoke with pt relaying info above. Pt states [facility]ard[facility]  [facility]. Pt expresses his thanks.   [facility]

## 2023-10-31 ENCOUNTER — Ambulatory Visit (AMBULATORY_SURGERY_CENTER): Admitting: *Deleted

## 2023-10-31 VITALS — Ht 66.0 in | Wt 238.0 lb

## 2023-10-31 DIAGNOSIS — Z1211 Encounter for screening for malignant neoplasm of colon: Secondary | ICD-10-CM

## 2023-10-31 MED ORDER — NA SULFATE-K SULFATE-MG SULF 17.5-3.13-1.6 GM/177ML PO SOLN: 1.0000 | Freq: Once | ORAL | 0 refills | Status: AC

## 2023-10-31 NOTE — Progress Notes (Signed)
 Pre visit completed over telephone. Instructions forwarded through MyChart.  Patient enxouraged to use Good Rx if insurance does not cover cost of prep.  Walked patient through steps to use Good RX    No egg or soy allergy known to patient  No issues known to pt with past sedation with any surgeries or procedures Patient denies ever being told they had issues or difficulty with intubation  No FH of Malignant Hyperthermia Pt is not on diet pills Pt is not on  home 02  Pt is not on blood thinners  Pt denies issues with constipation  No A fib or A flutter Have any cardiac testing pending-- NO Pt instructed to use [email or web address] or GoodRx for a price reduction on prep

## 2023-11-01 ENCOUNTER — Ambulatory Visit (INDEPENDENT_AMBULATORY_CARE_PROVIDER_SITE_OTHER): Payer: Self-pay | Admitting: Urology

## 2023-11-01 VITALS — BP 135/81 | HR 84

## 2023-11-01 DIAGNOSIS — C61 Malignant neoplasm of prostate: Secondary | ICD-10-CM | POA: Diagnosis not present

## 2023-11-01 DIAGNOSIS — N529 Male erectile dysfunction, unspecified: Secondary | ICD-10-CM

## 2023-11-01 MED ORDER — TADALAFIL 20 MG PO TABS: 20.0000 mg | ORAL_TABLET | Freq: Every day | ORAL | 11 refills | Status: AC | PRN

## 2023-11-01 NOTE — Patient Instructions (Signed)
 Trimix Injection Training  The provider will prescribe the Trimix medication to [facility].  You will need to pick up the medication at:   [address]  [place], [place] [address] [phone]  Once you have the medication in hand you will need to call our office to schedule a morning ONLY appointment to have injection teaching. The medication will induce an erection. The medication is estimated at $80.00. This medication is time sensitive as it expires quickly.   You will need to have a normal blood pressure in order to be injected. Your blood pressure must be under 140/90. We will check your blood pressure multiple times to ensure the reading is correct, if you BP continues to be elevated we will need to reschedule your appointment.   You have been scheduled for a Trimix titration appointment.    -In order for the appointment to proceed, there are certain parameters that need to be met. -Your blood pressure must be under control, this means it should be approximately 140/90 or lower.  If you take blood pressure medications, do not skip taking them.  If you have uncontrolled BP, I would get that under control before you pick up your medication and before your appointment.  We will take your blood pressure at the appointment and if it is above what is acceptable we will need to have you rescheduled.  This could result in your medication expiring before you are able to get back in for an appointment.  -Your medicine will be sent to [facility] [phone]), they will contact you once they receive the prescription to arrange payment plans and how you will get the medication (pick up or delivery) - if you do not hear from them in 2 to 3 days, please call them  -Your medication will have an expiration date, so do not pick up the medication more than a week before your appointment -When  you pick up the medication, you will have 3 vials of medication, a collection of needles and a collection of needles attached to a syringe, bring everything to your appointment.  We do not have any of this medication in the office and if you do not bring the medication, needles or syringes, the appointment cannot go forward. -Plan on being at your appointment for approximately 2 hours.  During the appointment, you will have small amounts of the medication injected directly into your penis to see what dose is needed to achieve a satisfactory erection.  Once a satisfactory erection is achieved, we will inject a medication to bring down the erection before you leave the office  Date:______________________________             Signature:______________________________________

## 2023-11-01 NOTE — Progress Notes (Signed)
 Colton Porter,acting as a scribe for Colton Gimenez, MD.,have documented all relevant documentation on the behalf of Colton Gimenez, MD,as directed by  Colton Gimenez, MD while in the presence of Colton Gimenez, MD.  11/01/23 1:08 PM   Colton Porter 09-Jul-1965 [id number]  Referring provider: Claire Crick, MD [address] [place],  [place] [address]  No chief complaint on file.   HPI: 58 year old male who returned today for a routine annual follow-up. He has a personal history of prostate cancer, for which he was treated with IMRT in 2022. His surgical pathology was Gleason 3+3 and 3+4, right greater than left.   His PSA remains undetectable as of 10/26/2023, and the plan is to continue biannual PSA monitoring.   He reports taking Sildenafil  but experiences headaches without improvement in symptoms. He is interested in trying Tadalafil  as an alternative. He is concerned about his future sexual function and is open to exploring other options if Tadalafil  is ineffective.   He denies any urinary issues as long as he takes his medication regularly.   PMH: Past Medical History:  Diagnosis Date   Alcohol dependence (HCC) 07/11/2012   Alcoholic cirrhosis of liver with ascites (HCC) 07/2014   s./p transplant 12/2015   Allergy    Anemia    Cellulitis of left leg    Chronic diastolic heart failure (HCC) 11/01/2013   CKD (chronic kidney disease) stage 3, GFR 30-59 ml/min (HCC) 11/04/2015   Diabetes mellitus without complication (HCC)    Type II   GERD (gastroesophageal reflux disease)    Hyperlipidemia    Hypertension    Left rib fracture 07/27/2019   Neuropathy    OSA on CPAP 07/11/2012   HST 07/2013:  AHI 39/hr.     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  Irrigation debridement of right thumb open distal phalanx fracture 2.  Repair of right thumb skin and nailbed laceration ;  Surgeon: Brunilda Capra, MD;  Location: [facility] OR;  Service: Orthopedics;  Laterality: Right;   LEFT AND RIGHT HEART CATHETERIZATION WITH CORONARY ANGIOGRAM N/A 06/10/2013   Procedure: LEFT AND RIGHT HEART CATHETERIZATION WITH CORONARY ANGIOGRAM;  Surgeon: Arlander Bellman, MD;  Location: [facility] CATH LAB;  Service: Cardiovascular;  Laterality: N/A;   LIVER TRANSPLANTATION  12/2015   alcoholic cirrhosis ([facility] at [facility])    Home Medications:  Allergies as of 11/01/2023       Reactions   Tolmetin Rash   cirrhosis   Ambien  [zolpidem  Tartrate] Other (See Comments)   Over-toxicity from liver failure   Morphine  And Codeine     Hallucinations.   Trulicity  [dulaglutide ]    frequency   Hydrochlorothiazide-triamterene Other (See Comments)   REACTION: dizzy, nausea   Lisinopril Other (See Comments)   REACTION: cough, decreased libido        Medication List        Accurate as of Nov 01, 2023  1:08 PM. If you have any questions, ask your nurse or doctor.          STOP taking these medications  sildenafil  50 MG tablet Commonly known as: Viagra        TAKE these medications    Accu-Chek FastClix Lancets Misc Use as instructed to check blood sugar once a day   Accu-Chek Guide test strip Generic drug: glucose blood Use as instructed to check blood sugar once a day   Accu-Chek Guide w/Device Kit 1 Units by Does not apply route as directed.   acetaminophen  500 MG tablet Commonly known as: TYLENOL  Take 500 mg by mouth every 6 (six) hours as needed for mild pain.   allopurinol  300 MG tablet Commonly known as: ZYLOPRIM  Take 1 tablet (300 mg total) by mouth daily.    Aspir-Low 81 MG tablet Generic drug: aspirin  EC Take 1 tablet (81 mg total) by mouth daily.   B-D UF III MINI PEN NEEDLES 31G X 5 MM Misc Generic drug: Insulin  Pen Needle USE TO INJECT INSULIN  DAILY   benzonatate  100 MG capsule Commonly known as: TESSALON  Take 1 capsule (100 mg total) by mouth 2 (two) times daily as needed for cough.   [facility] SPECTRAVITE PO Take 1 tablet by mouth at bedtime.   cyanocobalamin  500 MCG tablet Commonly known as: VITAMIN B12 Take 1 tablet (500 mcg total) by mouth daily.   docusate sodium 100 MG capsule Commonly known as: COLACE Take 100 mg by mouth daily as needed for mild constipation.   empagliflozin  25 MG Tabs tablet Commonly known as: Jardiance  Take 1 tablet (25 mg total) by mouth daily.   escitalopram  10 MG tablet Commonly known as: LEXAPRO  Take 1 tablet (10 mg total) by mouth daily.   FISH OIL  PO Take 1 tablet by mouth at bedtime.   fluticasone  50 MCG/ACT nasal spray Commonly known as: FLONASE  SPRAY 2 SPRAYS INTO EACH NOSTRIL EVERY DAY   furosemide  40 MG tablet Commonly known as: LASIX  Take 1 tablet (40 mg total) by mouth daily.   Klor-Con  M10 10 MEQ tablet Generic drug: potassium chloride  TAKE 1 TABLET BY MOUTH EVERY DAY   loratadine  10 MG tablet Commonly known as: CLARITIN  Take 10 mg by mouth daily.   losartan  50 MG tablet Commonly known as: COZAAR  Take 1 tablet (50 mg total) by mouth daily.   lovastatin  20 MG tablet Commonly known as: MEVACOR  Take 1 tablet (20 mg total) by mouth at bedtime.   magnesium  oxide 400 (240 Mg) MG tablet Commonly known as: MAG-OX TAKE 1 TABLET BY MOUTH TWICE A DAY   metFORMIN  500 MG 24 hr tablet Commonly known as: GLUCOPHAGE -XR TAKE 2 TABLETS BY MOUTH 2 TIMES DAILY WITH A MEAL.   mycophenolate 360 MG Tbec EC tablet Commonly known as: MYFORTIC Take 360 mg by mouth 2 (two) times daily.   omeprazole  40 MG capsule Commonly known as: PRILOSEC Take 1 capsule (40 mg total) by mouth  daily.   oseltamivir  75 MG capsule Commonly known as: Tamiflu  Take 1 capsule (75 mg total) by mouth 2 (two) times daily.   tacrolimus  1 MG capsule Commonly known as: PROGRAF  Take 1-2 mg by mouth See admin instructions. Take 2 mg by mouth in the morning, then take 1 mg  by mouth in the evening   tadalafil  20 MG tablet Commonly known as: CIALIS  Take 1 tablet (20 mg total) by mouth daily as needed for erectile dysfunction.   Toujeo  Max SoloStar 300 UNIT/ML Solostar Pen Generic drug: insulin  glargine (2 Unit Dial) Inject 110 Units into the skin daily.   triamcinolone  cream 0.1 % Commonly known as: KENALOG  Apply 1 Application  topically 2 (two) times daily. Apply to AA.   Vitamin D3 50 MCG (2000 UT) Tabs Take 1 tablet by mouth at bedtime.        Allergies:  Allergies  Allergen Reactions   Tolmetin Rash    cirrhosis   Ambien  [Zolpidem  Tartrate] Other (See Comments)    Over-toxicity from liver failure   Morphine  And Codeine      Hallucinations.   Trulicity  [Dulaglutide ]     frequency   Hydrochlorothiazide-Triamterene Other (See Comments)    REACTION: dizzy, nausea   Lisinopril Other (See Comments)    REACTION: cough, decreased libido    Family History: Family History  Problem Relation Age of Onset   Stroke Mother    Emphysema Mother    Hypertension Father    Heart disease Father    Emphysema Father    Lung cancer Maternal Aunt    Stroke Paternal Grandmother    Heart attack Neg Hx    Diabetes Neg Hx    Colon cancer Neg Hx    Colon polyps Neg Hx     Social History:  reports that he has never smoked. He has quit using smokeless tobacco.  His smokeless tobacco use included chew. He reports that he does not currently use drugs. He reports that he does not drink alcohol.   Physical Exam: BP 135/81   Pulse 84   Constitutional:  Alert and oriented, No acute distress. HEENT: [place] AT, moist mucus membranes.  Trachea midline, no masses. Neurologic: Grossly intact, no  focal deficits, moving all 4 extremities. Psychiatric: Normal mood and affect.   Assessment & Plan:    1. Prostate Cancer - His PSA remains undetectable, indicating stable disease status post-treatment.  - Plan to continue biannual PSA monitoring.  2. Erectile Dysfunction - He reports headaches with Sildenafil  use.  - Plan to switch to Tadalafil  20mg , to be taken at least two hours before intercourse, preferably on an empty stomach. -  -  - Discussed alternative options if Tadalafil  is ineffective, including penile injections and potential implant, though the latter is not recommended due to immunosuppression risk. - Prescription was ordered  Return in about 6 months (around 05/03/2024) for repeat PSA. Return in 1 year for PA visit with a PSA.  I have reviewed the above documentation for accuracy and completeness, and I agree with the above.   Colton Gimenez, MD   [facility] [address] [place], [place] [address] [phone]

## 2023-11-02 NOTE — Addendum Note (Signed)
 Addended by: Heber Little A on: 11/02/2023 10:44 AM   Modules accepted: Orders

## 2023-11-15 ENCOUNTER — Encounter: Admitting: Gastroenterology

## 2023-11-18 ENCOUNTER — Other Ambulatory Visit: Payer: Self-pay | Admitting: Physician Assistant

## 2023-11-29 DIAGNOSIS — K760 Fatty (change of) liver, not elsewhere classified: Secondary | ICD-10-CM | POA: Diagnosis not present

## 2023-11-29 DIAGNOSIS — D849 Immunodeficiency, unspecified: Secondary | ICD-10-CM | POA: Diagnosis not present

## 2023-11-29 DIAGNOSIS — Z944 Liver transplant status: Secondary | ICD-10-CM | POA: Diagnosis not present

## 2023-12-04 DIAGNOSIS — D849 Immunodeficiency, unspecified: Secondary | ICD-10-CM | POA: Diagnosis not present

## 2023-12-04 DIAGNOSIS — Z944 Liver transplant status: Secondary | ICD-10-CM | POA: Diagnosis not present

## 2023-12-04 DIAGNOSIS — K760 Fatty (change of) liver, not elsewhere classified: Secondary | ICD-10-CM | POA: Diagnosis not present

## 2023-12-06 ENCOUNTER — Other Ambulatory Visit: Payer: Self-pay | Admitting: Physician Assistant

## 2023-12-26 ENCOUNTER — Telehealth: Payer: Self-pay | Admitting: Physician Assistant

## 2023-12-26 MED ORDER — POTASSIUM CHLORIDE CRYS ER 10 MEQ PO TBCR: 10.0000 meq | EXTENDED_RELEASE_TABLET | Freq: Every day | ORAL | 0 refills | Status: DC

## 2023-12-26 NOTE — Telephone Encounter (Signed)
 Pt's medication was sent to pt's pharmacy as requested. Confirmation received.

## 2023-12-26 NOTE — Telephone Encounter (Signed)
 *  STAT* If patient is at the pharmacy, call can be transferred to refill team.   1. Which medications need to be refilled? (please list name of each medication and dose if known)   potassium chloride  (KLOR-CON  M) 10 MEQ tablet   2. Which pharmacy/location (including street and city if local pharmacy) is medication to be sent to?  [id number] [place], [place] - [address]   3. Do they need a 30 day or 90 day supply?  30  Patient has an appointment on 01/23/24 and needs enough medication to make it to appt

## 2024-01-10 NOTE — Progress Notes (Signed)
 Cardiology Office Note:  .   Date:  01/23/2024  ID:  Colton Porter, DOB [birth year], MRN [id number] PCP: Colton Baller, MD  [facility] Providers Cardiologist:  Colton FORBES Sorrow, MD (Inactive)    History of Present Illness: .   Colton Porter is a 58 y.o. male   with a hx of hyperlipidemia, obesity, aortic atherosclerosis, OSA on CPAP, hypertension, chronic diastolic dysfunction, chronic thrombocytopenia, significant EtOH history with subsequent alcoholic cirrhosis and liver transplant July 2017.   He had prior left and right heart cardiac catheterization December 2014 with normal coronaries and elevated right-sided pressures in the setting of diastolic dysfunction. Transthoracic echocardiogram 05/2016 normal LVEF 55 to 60% with grade 2 diastolic dysfunction, no R WMA. Myoview  2018 with normal LVEF no ischemia or prior infarct. TTE 07/31/2019 LVEF 65 to 70%, grade 1 diastolic dysfunction, no significant valvular disease.  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 The last couple of days he's had pressure in his left shoulder and upper on. Comes and goes off/on. He picked up some plywood and did some spraying. Also had his labrador jerk his arm when walking and he saw a cat. No dyspnea, palpitations, edema. He stays active but doesn't exercise outside of regular activities. Wears compression stockings 24/7.Gets extra salt in his diet.        ROS:    Studies Reviewed: SABRA    EKG Interpretation Date/Time:  Tuesday January 23 2024 10:29:04 EDT Ventricular Rate:  83 PR Interval:  172 QRS Duration:  82 QT Interval:  354 QTC Calculation: 415 R Axis:   21  Text Interpretation: Normal sinus rhythm Normal ECG When compared with ECG of 30-Jul-2019 20:02, PREVIOUS ECG IS PRESENT Confirmed by Colton Porter [phone]) on 01/23/2024 10:36:58 AM    Prior CV Studies:    TTE 07/2019: IMPRESSIONS   1. Left ventricular ejection fraction, by visual estimation, is 65 to  70%.  The left ventricle has hyperdynamic function. There is moderately  increased left ventricular hypertrophy.   2. Left ventricular diastolic parameters are consistent with Grade I  diastolic dysfunction (impaired relaxation).   3. The left ventricle has no regional wall motion abnormalities.   4. Global right ventricle has normal systolic function.The right  ventricular size is normal. Mildly increased right ventricular wall  thickness.   5. Left atrial size was normal.   6. Right atrial size was normal.   7. The mitral valve is grossly normal. No evidence of mitral valve  regurgitation.   8. The tricuspid valve is grossly normal.   9. The tricuspid valve is grossly normal. Tricuspid valve regurgitation  is trivial.  10. The aortic valve is grossly normal. Aortic valve regurgitation is not  visualized.  11. The pulmonic valve was grossly normal. Pulmonic valve regurgitation is  not visualized.  12. The interatrial septum was not well visualized.  13. Very poor acoustic windows due to patient's body habitus. LV and RV  function are grossly normal. No obvious valvular abnormalities noted.    Myoview  2018:   Nuclear stress EF: 64%. Normal wall motion There was no ST segment deviation noted during stress. No rest images obtained. Stress images do not show any perfusion defects. Based upon stress imaging, stress test is low risk no perfusion defects, normal ejection fraction    Risk Assessment/Calculations:             Physical Exam:   VS:  BP 120/80 (BP Location: Left Arm, Patient Position:  Sitting, Cuff Size: Normal)   Pulse 83   Ht 5' 6 (1.676 m)   Wt 242 lb (109.8 kg)   BMI 39.06 kg/m    Orhtostatics: No data found. Wt Readings from Last 3 Encounters:  01/23/24 242 lb (109.8 kg)  10/31/23 238 lb (108 kg)  08/02/23 238 lb 8 oz (108.2 kg)    GEN: Obese, in no acute distress NECK: No JVD; No carotid bruits CARDIAC:  RRR, no murmurs, rubs, gallops RESPIRATORY:  Clear to  auscultation without rales, wheezing or rhonchi  ABDOMEN: Soft, non-tender, non-distended EXTREMITIES:  No edema; No deformity   ASSESSMENT AND PLAN: .     Diastolic heart failure - Grossly euvolemic on exam. Continue compression socks as they are controlling edema. Continue GDMT includes Lasix , Jardiance .    Hypertension - BP well controlled. Continue current antihypertensive regimen.     Alcoholic cirrhosis s/p liver transplant 2017 -follows with a specialist in [place].   DM2 on insulin  - 03/22/21 A1c 6.6. 07/2023 Follows with endocrinology and PCP.    Obesity - Weight loss via diet and exercise encouraged.     OSA - Wears CPAP regularly. CPAP compliance encouraged.    Hyperlipidemia - overdue for FLP will check today.   Aortic atherosclerosis - Noted by CT abd 06/2019. No anginal symptoms. M-S type pain. Let us  know if it doesn't resolve. Heart healthy diet and regular cardiovascular exercise encouraged.  Continue statin.         Dispo: f/u in 1 yr.  Signed, Colton Pavy, PA-C

## 2024-01-18 ENCOUNTER — Encounter: Admitting: Gastroenterology

## 2024-01-19 ENCOUNTER — Other Ambulatory Visit: Payer: Self-pay | Admitting: Physician Assistant

## 2024-01-23 ENCOUNTER — Ambulatory Visit: Attending: Internal Medicine | Admitting: Physician Assistant

## 2024-01-23 ENCOUNTER — Encounter: Payer: Self-pay | Admitting: Physician Assistant

## 2024-01-23 VITALS — BP 120/80 | HR 83 | Ht 66.0 in | Wt 242.0 lb

## 2024-01-23 DIAGNOSIS — G4733 Obstructive sleep apnea (adult) (pediatric): Secondary | ICD-10-CM | POA: Diagnosis not present

## 2024-01-23 DIAGNOSIS — I700 Atherosclerosis of aorta: Secondary | ICD-10-CM

## 2024-01-23 DIAGNOSIS — I5032 Chronic diastolic (congestive) heart failure: Secondary | ICD-10-CM | POA: Diagnosis not present

## 2024-01-23 DIAGNOSIS — Z794 Long term (current) use of insulin: Secondary | ICD-10-CM

## 2024-01-23 DIAGNOSIS — E785 Hyperlipidemia, unspecified: Secondary | ICD-10-CM | POA: Diagnosis not present

## 2024-01-23 DIAGNOSIS — Z944 Liver transplant status: Secondary | ICD-10-CM

## 2024-01-23 DIAGNOSIS — I10 Essential (primary) hypertension: Secondary | ICD-10-CM

## 2024-01-23 DIAGNOSIS — E1169 Type 2 diabetes mellitus with other specified complication: Secondary | ICD-10-CM | POA: Diagnosis not present

## 2024-01-23 MED ORDER — POTASSIUM CHLORIDE CRYS ER 10 MEQ PO TBCR: 10.0000 meq | EXTENDED_RELEASE_TABLET | Freq: Every day | ORAL | 3 refills | Status: AC

## 2024-01-23 NOTE — Patient Instructions (Addendum)
 Medication Instructions:  Your physician recommends that you continue on your current medications as directed. Please refer to the Current Medication list given to you today.  *If you need a refill on your cardiac medications before your next appointment, please call your pharmacy*  Lab Work: TODAY:  CMET, CBC, LIPID,  & TSH  If you have labs (blood work) drawn today and your tests are completely normal, you will receive your results only by: MyChart Message (if you have MyChart) OR A paper copy in the mail If you have any lab test that is abnormal or we need to change your treatment, we will call you to review the results.  Testing/Procedures: None ordered  Follow-Up: At [facility], you and your health needs are our priority.  As part of our continuing mission to provide you with exceptional heart care, our providers are all part of one team.  This team includes your primary Cardiologist (physician) and Advanced Practice Providers or APPs (Physician Assistants and Nurse Practitioners) who all work together to provide you with the care you need, when you need it.  Your next appointment:   1 year(s)  Provider:   Georganna Archer, MD    We recommend signing up for the patient portal called MyChart.  Sign up information is provided on this After Visit Summary.  MyChart is used to connect with patients for Virtual Visits (Telemedicine).  Patients are able to view lab/test results, encounter notes, upcoming appointments, etc.  Non-urgent messages can be sent to your provider as well.   To learn more about what you can do with MyChart, go to [email or web address].   Other Instructions

## 2024-01-24 ENCOUNTER — Ambulatory Visit: Payer: Self-pay | Admitting: Physician Assistant

## 2024-01-24 DIAGNOSIS — N1831 Chronic kidney disease, stage 3a: Secondary | ICD-10-CM

## 2024-01-24 DIAGNOSIS — E785 Hyperlipidemia, unspecified: Secondary | ICD-10-CM

## 2024-01-24 LAB — COMPREHENSIVE METABOLIC PANEL WITH GFR
ALT: 22 IU/L (ref 0–44)
AST: 24 IU/L (ref 0–40)
Albumin: 4.5 g/dL (ref 3.8–4.9)
Alkaline Phosphatase: 96 IU/L (ref 44–121)
BUN/Creatinine Ratio: 12 (ref 9–20)
BUN: 16 mg/dL (ref 6–24)
Bilirubin Total: 0.8 mg/dL (ref 0.0–1.2)
CO2: 22 mmol/L (ref 20–29)
Calcium: 9.6 mg/dL (ref 8.7–10.2)
Chloride: 99 mmol/L (ref 96–106)
Creatinine, Ser: 1.39 mg/dL — ABNORMAL HIGH (ref 0.76–1.27)
Globulin, Total: 3.1 g/dL (ref 1.5–4.5)
Glucose: 127 mg/dL — ABNORMAL HIGH (ref 70–99)
Potassium: 4.9 mmol/L (ref 3.5–5.2)
Sodium: 139 mmol/L (ref 134–144)
Total Protein: 7.6 g/dL (ref 6.0–8.5)
eGFR: 59 mL/min/1.73 — ABNORMAL LOW (ref >59)

## 2024-01-24 LAB — CBC
Hematocrit: 47.5 % (ref 37.5–51.0)
Hemoglobin: 16 g/dL (ref 13.0–17.7)
MCH: 29.1 pg (ref 26.6–33.0)
MCHC: 33.7 g/dL (ref 31.5–35.7)
MCV: 87 fL (ref 79–97)
Platelets: 156 x10E3/uL (ref 150–450)
RBC: 5.49 x10E6/uL (ref 4.14–5.80)
RDW: 13.9 % (ref 11.6–15.4)
WBC: 7.6 x10E3/uL (ref 3.4–10.8)

## 2024-01-24 LAB — LIPID PANEL
Chol/HDL Ratio: 3.3 ratio (ref 0.0–5.0)
Cholesterol, Total: 105 mg/dL (ref 100–199)
HDL: 32 mg/dL — ABNORMAL LOW (ref >39)
LDL Chol Calc (NIH): 26 mg/dL (ref 0–99)
Triglycerides: 324 mg/dL — ABNORMAL HIGH (ref 0–149)
VLDL Cholesterol Cal: 47 mg/dL — ABNORMAL HIGH (ref 5–40)

## 2024-01-24 LAB — TSH: TSH: 0.97 u[IU]/mL (ref 0.450–4.500)

## 2024-01-26 ENCOUNTER — Other Ambulatory Visit: Payer: Self-pay | Admitting: Family Medicine

## 2024-01-26 DIAGNOSIS — E1169 Type 2 diabetes mellitus with other specified complication: Secondary | ICD-10-CM

## 2024-01-26 MED ORDER — FUROSEMIDE 20 MG PO TABS: 20.0000 mg | ORAL_TABLET | Freq: Every day | ORAL | 3 refills | Status: AC

## 2024-01-26 MED ORDER — TOUJEO MAX SOLOSTAR 300 UNIT/ML ~~LOC~~ SOPN: 110.0000 [IU] | PEN_INJECTOR | Freq: Every day | SUBCUTANEOUS | 1 refills | Status: DC

## 2024-01-26 NOTE — Telephone Encounter (Signed)
 Copied from CRM [id number]. Topic: Clinical - Medication Refill >> Jan 26, 2024  9:56 AM [place] E wrote: Medication: insulin  glargine, 2 Unit Dial, (TOUJEO  MAX SOLOSTAR) 300 UNIT/ML Solostar Pen  *Patient leaving out of town on Sunday, advised of the 3 business day turnaround time but checking to see if this can get filled before he leaves.  Has the patient contacted their pharmacy? Yes (Agent: If no, request that the patient contact the pharmacy for the refill. If patient does not wish to contact the pharmacy document the reason why and proceed with request.) (Agent: If yes, when and what did the pharmacy advise?)  This is the patient's preferred pharmacy:  [id number] [place], [place] - [address] [place] [address] Phone: [phone] Fax: [phone]   Is this the correct pharmacy for this prescription? Yes If no, delete pharmacy and type the correct one.   Has the prescription been filled recently? No  Is the patient out of the medication? No, patient states he has one bottle left but unsure of how long that will last.  Has the patient been seen for an appointment in the last year OR does the patient have an upcoming appointment? Yes  Can we respond through MyChart? Yes  Agent: Please be advised that Rx refills may take up to 3 business days. We ask that you follow-up with your pharmacy.

## 2024-02-27 ENCOUNTER — Other Ambulatory Visit: Payer: Self-pay | Admitting: Family Medicine

## 2024-02-27 ENCOUNTER — Encounter: Payer: Self-pay | Admitting: Family Medicine

## 2024-02-27 DIAGNOSIS — D649 Anemia, unspecified: Secondary | ICD-10-CM

## 2024-02-27 DIAGNOSIS — M1A0790 Idiopathic chronic gout, unspecified ankle and foot, without tophus (tophi): Secondary | ICD-10-CM

## 2024-02-27 DIAGNOSIS — E1169 Type 2 diabetes mellitus with other specified complication: Secondary | ICD-10-CM

## 2024-02-27 DIAGNOSIS — N401 Enlarged prostate with lower urinary tract symptoms: Secondary | ICD-10-CM

## 2024-02-27 DIAGNOSIS — N1831 Chronic kidney disease, stage 3a: Secondary | ICD-10-CM

## 2024-02-27 DIAGNOSIS — D696 Thrombocytopenia, unspecified: Secondary | ICD-10-CM

## 2024-02-27 DIAGNOSIS — C61 Malignant neoplasm of prostate: Secondary | ICD-10-CM

## 2024-02-27 DIAGNOSIS — Z944 Liver transplant status: Secondary | ICD-10-CM

## 2024-03-01 ENCOUNTER — Other Ambulatory Visit (INDEPENDENT_AMBULATORY_CARE_PROVIDER_SITE_OTHER): Payer: Medicare HMO

## 2024-03-01 DIAGNOSIS — Z794 Long term (current) use of insulin: Secondary | ICD-10-CM

## 2024-03-01 DIAGNOSIS — E1169 Type 2 diabetes mellitus with other specified complication: Secondary | ICD-10-CM | POA: Diagnosis not present

## 2024-03-01 DIAGNOSIS — E785 Hyperlipidemia, unspecified: Secondary | ICD-10-CM

## 2024-03-01 DIAGNOSIS — N1831 Chronic kidney disease, stage 3a: Secondary | ICD-10-CM | POA: Diagnosis not present

## 2024-03-01 DIAGNOSIS — Z944 Liver transplant status: Secondary | ICD-10-CM | POA: Diagnosis not present

## 2024-03-01 LAB — COMPREHENSIVE METABOLIC PANEL WITH GFR
ALT: 18 U/L (ref 0–53)
AST: 14 U/L (ref 0–37)
Albumin: 4.2 g/dL (ref 3.5–5.2)
Alkaline Phosphatase: 77 U/L (ref 39–117)
BUN: 14 mg/dL (ref 6–23)
CO2: 28 meq/L (ref 19–32)
Calcium: 9.3 mg/dL (ref 8.4–10.5)
Chloride: 102 meq/L (ref 96–112)
Creatinine, Ser: 1.3 mg/dL (ref 0.40–1.50)
GFR: 61 mL/min (ref >60.00)
Glucose, Bld: 162 mg/dL — ABNORMAL HIGH (ref 70–99)
Potassium: 4.5 meq/L (ref 3.5–5.1)
Sodium: 140 meq/L (ref 135–145)
Total Bilirubin: 0.6 mg/dL (ref 0.2–1.2)
Total Protein: 7.1 g/dL (ref 6.0–8.3)

## 2024-03-01 LAB — MICROALBUMIN / CREATININE URINE RATIO
Creatinine,U: 137.1 mg/dL
Microalb Creat Ratio: 15.4 mg/g (ref 0.0–30.0)
Microalb, Ur: 2.1 mg/dL — ABNORMAL HIGH (ref 0.0–1.9)

## 2024-03-01 LAB — PHOSPHORUS: Phosphorus: 2.9 mg/dL (ref 2.3–4.6)

## 2024-03-01 LAB — VITAMIN D 25 HYDROXY (VIT D DEFICIENCY, FRACTURES): VITD: 41.86 ng/mL (ref 30.00–100.00)

## 2024-03-01 LAB — LDL CHOLESTEROL, DIRECT: Direct LDL: 34 mg/dL

## 2024-03-01 LAB — LIPID PANEL
Cholesterol: 91 mg/dL (ref 0–200)
HDL: 28.6 mg/dL — ABNORMAL LOW (ref >39.00)
NonHDL: 62.16
Total CHOL/HDL Ratio: 3
Triglycerides: 461 mg/dL — ABNORMAL HIGH (ref 0.0–149.0)
VLDL: 92.2 mg/dL — ABNORMAL HIGH (ref 0.0–40.0)

## 2024-03-01 LAB — HEMOGLOBIN A1C: Hgb A1c MFr Bld: 7.2 % — ABNORMAL HIGH (ref 4.6–6.5)

## 2024-03-01 LAB — MAGNESIUM: Magnesium: 1.9 mg/dL (ref 1.5–2.5)

## 2024-03-01 LAB — VITAMIN B12: Vitamin B-12: 288 pg/mL (ref 211–911)

## 2024-03-02 ENCOUNTER — Ambulatory Visit: Payer: Self-pay | Admitting: Family Medicine

## 2024-03-02 LAB — PARATHYROID HORMONE, INTACT (NO CA): PTH: 15 pg/mL — ABNORMAL LOW (ref 16–77)

## 2024-03-05 ENCOUNTER — Ambulatory Visit (INDEPENDENT_AMBULATORY_CARE_PROVIDER_SITE_OTHER)

## 2024-03-05 VITALS — BP 120/80 | Ht 66.0 in | Wt 240.0 lb

## 2024-03-05 DIAGNOSIS — Z0000 Encounter for general adult medical examination without abnormal findings: Secondary | ICD-10-CM

## 2024-03-05 NOTE — Patient Instructions (Signed)
 [name],  Thank you for taking the time for your Medicare Wellness Visit. I appreciate your continued commitment to your health goals. Please review the care plan we discussed, and feel free to reach out if I can assist you further.  Medicare recommends these wellness visits once per year to help you and your care team stay ahead of potential health issues. These visits are designed to focus on prevention, allowing your provider to concentrate on managing your acute and chronic conditions during your regular appointments.  Please note that Annual Wellness Visits do not include a physical exam. Some assessments may be limited, especially if the visit was conducted virtually. If needed, we may recommend a separate in-person follow-up with your provider.  Ongoing Care Seeing your primary care provider every 3 to 6 months helps us  monitor your health and provide consistent, personalized care.   Referrals If a referral was made during today's visit and you haven't received any updates within two weeks, please contact the referred provider directly to check on the status.  Recommended Screenings:  Health Maintenance  Topic Date Due   Hepatitis B Vaccine (3 of 3 - 19+ 3-dose series) 01/29/2016   Stool Blood Test  03/22/2023   Colon Cancer Screening  09/04/2023   Complete foot exam   11/23/2023   Eye exam for diabetics  01/12/2024   Flu Shot  01/26/2024   COVID-19 Vaccine (5 - 2025-26 season) 02/26/2024   Hemoglobin A1C  08/29/2024   Yearly kidney function blood test for diabetes  03/01/2025   Yearly kidney health urinalysis for diabetes  03/01/2025   Medicare Annual Wellness Visit  03/05/2025   DTaP/Tdap/Td vaccine (4 - Td or Tdap) 10/06/2030   Pneumococcal Vaccine for age over 80  Completed   Hepatitis C Screening  Completed   HIV Screening  Completed   Zoster (Shingles) Vaccine  Completed   HPV Vaccine  Aged Out   Meningitis B Vaccine  Aged Out       03/05/2024    9:49 AM   Advanced Directives  Does Patient Have a Medical Advance Directive? No  Would patient like information on creating a medical advance directive? No - Patient declined   Advance Care Planning is important because it: Ensures you receive medical care that aligns with your values, goals, and preferences. Provides guidance to your family and loved ones, reducing the emotional burden of decision-making during critical moments.  Vision: Annual vision screenings are recommended for early detection of glaucoma, cataracts, and diabetic retinopathy. These exams can also reveal signs of chronic conditions such as diabetes and high blood pressure.  Dental: Annual dental screenings help detect early signs of oral cancer, gum disease, and other conditions linked to overall health, including heart disease and diabetes.  Please see the attached documents for additional preventive care recommendations.

## 2024-03-05 NOTE — Progress Notes (Signed)
 Because this visit was a virtual/telehealth visit,  certain criteria was not obtained, such a blood pressure, CBG if applicable, and timed get up and go. Any medications not marked as taking were not mentioned during the medication reconciliation part of the visit. Any vitals not documented were not able to be obtained due to this being a telehealth visit or patient was unable to self-report a recent blood pressure reading due to a lack of equipment at home via telehealth. Vitals that have been documented are verbally provided by the patient.  This visit was performed by a medical professional under my direct supervision. I was immediately available for consultation/collaboration. I have reviewed and agree with the Annual Wellness Visit documentation.  Subjective:   Colton Porter is a 58 y.o. who presents for a Medicare Wellness preventive visit.  As a reminder, Annual Wellness Visits don't include a physical exam, and some assessments may be limited, especially if this visit is performed virtually. We may recommend an in-person follow-up visit with your provider if needed.  Visit Complete: Virtual I connected with  Colton Porter Stain on 03/05/24 by a audio enabled telemedicine application and verified that I am speaking with the correct person using two identifiers.  Patient Location: Home  Provider Location: Home Office  I discussed the limitations of evaluation and management by telemedicine. The patient expressed understanding and agreed to proceed.  Vital Signs: Because this visit was a virtual/telehealth visit, some criteria may be missing or patient reported. Any vitals not documented were not able to be obtained and vitals that have been documented are patient reported.  VideoDeclined- This patient declined Librarian, academic. Therefore the visit was completed with audio only.  Persons Participating in Visit: Patient.  AWV Questionnaire: No: Patient  Medicare AWV questionnaire was not completed prior to this visit.  Cardiac Risk Factors include: advanced age (>29men, >31 women);male gender;diabetes mellitus;obesity (BMI >30kg/m2)     Objective:    Today's Vitals   03/05/24 0944  BP: 120/80  Weight: 240 lb (108.9 kg)  Height: 5' 6 (1.676 m)   Body mass index is 38.74 kg/m.     03/05/2024    9:49 AM 05/11/2023   10:52 AM 11/18/2021   11:05 AM 10/05/2020    7:34 PM 10/05/2020    2:04 PM 01/22/2020    3:28 PM 08/01/2019    8:45 AM  Advanced Directives  Does Patient Have a Medical Advance Directive? No No No No No No No  Does patient want to make changes to medical advance directive?       No - Patient declined  Would patient like information on creating a medical advance directive? No - Patient declined No - Patient declined No - Patient declined No - Patient declined No - Patient declined No - Patient declined No - Patient declined    Current Medications (verified) Outpatient Encounter Medications as of 03/05/2024  Medication Sig   Accu-Chek FastClix Lancets MISC Use as instructed to check blood sugar once a day   acetaminophen  (TYLENOL ) 500 MG tablet Take 500 mg by mouth every 6 (six) hours as needed for mild pain.   allopurinol  (ZYLOPRIM ) 300 MG tablet Take 1 tablet (300 mg total) by mouth daily.   ASPIR-LOW 81 MG EC tablet Take 1 tablet (81 mg total) by mouth daily.   B-D UF III MINI PEN NEEDLES 31G X 5 MM MISC USE TO INJECT INSULIN  DAILY   benzonatate  (TESSALON ) 100 MG capsule Take 1 capsule (  100 mg total) by mouth 2 (two) times daily as needed for cough.   Blood Glucose Monitoring Suppl (ACCU-CHEK GUIDE) w/Device KIT 1 Units by Does not apply route as directed.   Cholecalciferol (VITAMIN D3) 2000 units TABS Take 1 tablet by mouth at bedtime.   cyanocobalamin  (VITAMIN B12) 500 MCG tablet Take 1 tablet (500 mcg total) by mouth daily.   docusate sodium (COLACE) 100 MG capsule Take 100 mg by mouth daily as needed for mild  constipation.    empagliflozin  (JARDIANCE ) 25 MG TABS tablet Take 1 tablet (25 mg total) by mouth daily.   escitalopram  (LEXAPRO ) 10 MG tablet Take 1 tablet (10 mg total) by mouth daily.   fluticasone  (FLONASE ) 50 MCG/ACT nasal spray SPRAY 2 SPRAYS INTO EACH NOSTRIL EVERY DAY   furosemide  (LASIX ) 20 MG tablet Take 1 tablet (20 mg total) by mouth daily. You can take an extra lasix  if leg edema or weight gain 2-3 lbs overnight.   glucose blood (ACCU-CHEK GUIDE) test strip Use as instructed to check blood sugar once a day   insulin  glargine, 2 Unit Dial, (TOUJEO  MAX SOLOSTAR) 300 UNIT/ML Solostar Pen Inject 110 Units into the skin daily.   loratadine  (CLARITIN ) 10 MG tablet Take 10 mg by mouth daily.   losartan  (COZAAR ) 50 MG tablet Take 1 tablet (50 mg total) by mouth daily.   lovastatin  (MEVACOR ) 20 MG tablet Take 1 tablet (20 mg total) by mouth at bedtime.   magnesium  oxide (MAG-OX) 400 (240 Mg) MG tablet TAKE 1 TABLET BY MOUTH TWICE A DAY   metFORMIN  (GLUCOPHAGE -XR) 500 MG 24 hr tablet TAKE 2 TABLETS BY MOUTH 2 TIMES DAILY WITH A MEAL.   Multiple Vitamins-Minerals ([employer] SPECTRAVITE PO) Take 1 tablet by mouth at bedtime.   mycophenolate (MYFORTIC) 360 MG TBEC EC tablet Take 360 mg by mouth 2 (two) times daily.   omeprazole  (PRILOSEC) 40 MG capsule Take 1 capsule (40 mg total) by mouth daily.   oseltamivir  (TAMIFLU ) 75 MG capsule Take 1 capsule (75 mg total) by mouth 2 (two) times daily.   potassium chloride  (KLOR-CON  M) 10 MEQ tablet Take 1 tablet (10 mEq total) by mouth daily.   tacrolimus  (PROGRAF ) 1 MG capsule Take 1-2 mg by mouth See admin instructions. Take 2 mg by mouth in the morning, then take 1 mg  by mouth in the evening   tadalafil  (CIALIS ) 20 MG tablet Take 1 tablet (20 mg total) by mouth daily as needed for erectile dysfunction.   triamcinolone  cream (KENALOG ) 0.1 % Apply 1 Application topically 2 (two) times daily. Apply to AA.   Omega-3 Fatty Acids (FISH OIL  PO) Take 1 tablet by  mouth at bedtime. (Patient not taking: Reported on 03/05/2024)   No facility-administered encounter medications on file as of 03/05/2024.    Allergies (verified) Tolmetin, Ambien  [zolpidem  tartrate], Morphine  and codeine , Trulicity  [dulaglutide ], Hydrochlorothiazide-triamterene, and Lisinopril   History: Past Medical History:  Diagnosis Date   Alcohol dependence (HCC) 07/11/2012   Alcoholic cirrhosis of liver with ascites (HCC) 07/2014   s./p transplant 12/2015   Allergy    Anemia    Cellulitis of left leg    Chronic diastolic heart failure (HCC) 11/01/2013   CKD (chronic kidney disease) stage 3, GFR 30-59 ml/min (HCC) 11/04/2015   Diabetes mellitus without complication (HCC)    Type II   GERD (gastroesophageal reflux disease)    Hyperlipidemia    Hypertension    Influenza A 07/31/2023   Left rib fracture 07/27/2019  Neuropathy    OSA on CPAP 07/11/2012   HST 07/2013:  AHI 39/hr.     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  Irrigation debridement of right thumb open distal phalanx fracture 2.  Repair of right thumb skin and nailbed laceration ;  Surgeon: Murrell Drivers, MD;  Location: [facility] OR;  Service: Orthopedics;  Laterality: Right;   LEFT AND RIGHT HEART CATHETERIZATION WITH CORONARY ANGIOGRAM N/A 06/10/2013   Procedure: LEFT AND RIGHT HEART CATHETERIZATION WITH CORONARY ANGIOGRAM;  Surgeon: Ozell JONETTA Fell, MD;  Location: [facility] CATH LAB;  Service: Cardiovascular;  Laterality: N/A;   LIVER TRANSPLANTATION  12/2015   alcoholic cirrhosis ([facility] at [facility])   Family History  Problem Relation Age of Onset   Stroke Mother     Emphysema Mother    Hypertension Father    Heart disease Father    Emphysema Father    Lung cancer Maternal Aunt    Stroke Paternal Grandmother    Heart attack Neg Hx    Diabetes Neg Hx    Colon cancer Neg Hx    Colon polyps Neg Hx    Social History   Socioeconomic History   Marital status: Married    Spouse name: Not on file   Number of children: 2   Years of education: Not on file   Highest education level: Not on file  Occupational History   Occupation: Public affairs consultant    Comment: no work lately  Tobacco Use   Smoking status: Never   Smokeless tobacco: Former    Types: Associate Professor status: Never Used  Substance and Sexual Activity   Alcohol use: No    Alcohol/week: 0.0 standard drinks of alcohol    Comment: 4-6 drinks daily - NO ETOH SINCE APRIL 2016   Drug use: Not Currently    Comment: remote use of marijuana in the past, has since quit   Sexual activity: Not on file  Other Topics Concern   Not on file  Social History Narrative    Lives with wife   Grown children   31yo son Carliss passed away 2022-06-23    Occupation: full disability after cirrhosis dx, some working    Activity: some yardwork   Diet: good water , good fruit intake, lots of red meats   Social Drivers of Corporate investment banker Strain: Low Risk  (03/05/2024)   Overall Financial Resource Strain (CARDIA)    Difficulty of Paying Living Expenses: Not very hard  Food Insecurity: No Food Insecurity (03/05/2024)   Hunger Vital Sign    Worried About Running Out of Food in the Last Year: Never true    Ran Out of Food in the Last Year: Never true  Transportation Needs: No Transportation Needs (03/05/2024)   PRAPARE - Administrator, Civil Service (Medical): No    Lack of Transportation (Non-Medical): No  Physical Activity: Sufficiently Active (03/05/2024)   Exercise Vital Sign    Days of Exercise per Week: 5 days    Minutes of Exercise per Session: 30 min  Stress: No  Stress  Concern Present (03/05/2024)   [employer] of Occupational Health - Occupational Stress Questionnaire    Feeling of Stress: Not at all  Social Connections: Moderately Isolated (03/05/2024)   Social Connection and Isolation Panel    Frequency of Communication with Friends and Family: More than three times a week    Frequency of Social Gatherings with Friends and Family: Once a week    Attends Religious Services: Never    Database administrator or Organizations: No    Attends Engineer, structural: Never    Marital Status: Married    Tobacco Counseling Counseling given: Not Answered    Clinical Intake:  Pre-visit preparation completed: Yes  Pain : No/denies pain     BMI - recorded: 38.74 Nutritional Status: BMI > 30  Obese Nutritional Risks: None Diabetes: No  Lab Results  Component Value Date   HGBA1C 7.2 (H) 03/01/2024   HGBA1C 6.6 (A) 08/02/2023   HGBA1C 7.7 (A) 05/02/2023     How often do you need to have someone help you when you read instructions, pamphlets, or other written materials from your doctor or pharmacy?: 1 - Never  Interpreter Needed?: No  Information entered by :: Tiarah Shisler,CMA   Activities of Daily Living     03/05/2024    9:47 AM  In your present state of health, do you have any difficulty performing the following activities:  Hearing? 0  Vision? 0  Difficulty concentrating or making decisions? 1  Walking or climbing stairs? 1  Dressing or bathing? 0  Doing errands, shopping? 0  Preparing Food and eating ? N  Using the Toilet? N  In the past six months, have you accidently leaked urine? Y  Do you have problems with loss of bowel control? N  Managing your Medications? N  Managing your Finances? N  Housekeeping or managing your Housekeeping? N    Patient Care Team: Rilla Baller, MD as PCP - General (Family Medicine) Floretta Mallard, MD as PCP - Cardiology (Cardiology) Fate Morna SAILOR, [facility] (Inactive) as  Pharmacist (Pharmacist)  I have updated your Care Teams any recent Medical Services you may have received from other providers in the past year.     Assessment:   This is a routine wellness examination for Uno.  Hearing/Vision screen Hearing Screening - Comments:: No difficulties  Vision Screening - Comments:: Patient wears otc readers   Goals Addressed             This Visit's Progress    Patient Stated       To live everyday the best he can       Depression Screen     03/05/2024    9:50 AM 05/02/2023    8:46 AM 03/07/2023    2:21 PM 11/23/2022    8:30 AM 03/01/2022    8:53 AM 10/27/2021   10:41 AM 02/15/2021    9:33 AM  PHQ 2/9 Scores  PHQ - 2 Score 0 0 0 0 0 0 0  PHQ- 9 Score 0 2  5 2 5 5     Fall Risk     03/05/2024    9:49 AM 03/07/2023    2:20 PM 11/23/2022    8:30 AM 03/01/2022    8:07 AM 02/15/2021    9:45 AM  Fall Risk   Falls in the past year? 0 0 0 1 0  Number falls in past yr: 0   0 0  Injury with Fall? 0  0 0  Risk for fall due to : No Fall Risks      Follow up Falls evaluation completed        MEDICARE RISK AT HOME:  Medicare Risk at Home Any stairs in or around the home?: No If so, are there any without handrails?: No Home free of loose throw rugs in walkways, pet beds, electrical cords, etc?: Yes Adequate lighting in your home to reduce risk of falls?: Yes Life alert?: No Use of a cane, walker or w/c?: No Grab bars in the bathroom?: No Shower chair or bench in shower?: No Elevated toilet seat or a handicapped toilet?: Yes  TIMED UP AND GO:  Was the test performed?  No  Cognitive Function: 6CIT completed    01/22/2020    3:34 PM 10/15/2018    8:53 AM 10/06/2017   10:17 AM  MMSE - Mini Mental State Exam  Not completed: Refused    Orientation to time  5 5  Orientation to Place  5 5  Registration  3 3  Attention/ Calculation  0 0  Recall  3 3  Language- name 2 objects  0 0  Language- repeat  1 1  Language- follow 3 step command  0 3   Language- read & follow direction  0 0  Write a sentence  0 0  Copy design  0 0  Total score  17 20        03/05/2024    9:46 AM  6CIT Screen  What Year? 0 points  What month? 0 points  What time? 0 points  Count back from 20 0 points  Months in reverse 0 points  Repeat phrase 0 points  Total Score 0 points    Immunizations Immunization History  Administered Date(s) Administered   Hep A / Hep B 01/16/2015, 01/23/2015, 02/06/2015   Hepatitis B, ADULT 12/04/2015   Influenza Whole 03/24/2010   Influenza, Seasonal, Injecte, Preservative Fre 03/07/2023   Influenza,inj,Quad PF,6+ Mos 04/22/2015, 04/06/2017, 04/23/2018, 04/23/2019, 04/07/2020, 06/22/2021, 03/01/2022   PFIZER(Purple Top)SARS-COV-2 Vaccination 11/07/2019, 11/28/2019, 05/07/2020, 12/04/2020   PNEUMOCOCCAL CONJUGATE-20 02/15/2021   Pneumococcal Polysaccharide-23 04/22/2015   Td 05/01/2007   Tdap 12/14/2015, 10/05/2020   Zoster Recombinant(Shingrix) 05/14/2022, 07/15/2022    Screening Tests Health Maintenance  Topic Date Due   Hepatitis B Vaccines 19-59 Average Risk (3 of 3 - 19+ 3-dose series) 01/29/2016   COLON CANCER SCREENING ANNUAL FOBT  03/22/2023   Colonoscopy  09/04/2023   FOOT EXAM  11/23/2023   OPHTHALMOLOGY EXAM  01/12/2024   Influenza Vaccine  01/26/2024   COVID-19 Vaccine (5 - 2025-26 season) 02/26/2024   HEMOGLOBIN A1C  08/29/2024   Diabetic kidney evaluation - eGFR measurement  03/01/2025   Diabetic kidney evaluation - Urine ACR  03/01/2025   Medicare Annual Wellness (AWV)  03/05/2025   DTaP/Tdap/Td (4 - Td or Tdap) 10/06/2030   Pneumococcal Vaccine: 50+ Years  Completed   Hepatitis C Screening  Completed   HIV Screening  Completed   Zoster Vaccines- Shingrix  Completed   HPV VACCINES  Aged Out   Meningococcal B Vaccine  Aged Out    Health Maintenance Items Addressed:patient declined   Additional Screening:  Vision Screening: Recommended annual ophthalmology exams for early detection  of glaucoma and other disorders of the eye. Is the patient up to date with their annual eye exam?  No  Who is the provider or what is the name of the office in which the patient attends annual eye  exams?   Dental Screening: Recommended annual dental exams for proper oral hygiene  Community Resource Referral / Chronic Care Management: CRR required this visit?  No   CCM required this visit?  No   Plan:    I have personally reviewed and noted the following in the patient's chart:   Medical and social history Use of alcohol, tobacco or illicit drugs  Current medications and supplements including opioid prescriptions. Patient is not currently taking opioid prescriptions. Functional ability and status Nutritional status Physical activity Advanced directives List of other physicians Hospitalizations, surgeries, and ER visits in previous 12 months Vitals Screenings to include cognitive, depression, and falls Referrals and appointments  In addition, I have reviewed and discussed with patient certain preventive protocols, quality metrics, and best practice recommendations. A written personalized care plan for preventive services as well as general preventive health recommendations were provided to patient.   Lyle MARLA Right, [place]   03/05/2024   After Visit Summary: (MyChart) Due to this being a telephonic visit, the after visit summary with patients personalized plan was offered to patient via MyChart   Notes: Nothing significant to report at this time.

## 2024-03-08 ENCOUNTER — Ambulatory Visit: Payer: Medicare HMO | Admitting: Family Medicine

## 2024-03-08 ENCOUNTER — Encounter: Payer: Self-pay | Admitting: Family Medicine

## 2024-03-08 VITALS — BP 126/64 | HR 86 | Temp 98.6°F | Ht 66.0 in | Wt 241.0 lb

## 2024-03-08 DIAGNOSIS — I10 Essential (primary) hypertension: Secondary | ICD-10-CM

## 2024-03-08 DIAGNOSIS — Z794 Long term (current) use of insulin: Secondary | ICD-10-CM

## 2024-03-08 DIAGNOSIS — Z7189 Other specified counseling: Secondary | ICD-10-CM

## 2024-03-08 DIAGNOSIS — C61 Malignant neoplasm of prostate: Secondary | ICD-10-CM

## 2024-03-08 DIAGNOSIS — F321 Major depressive disorder, single episode, moderate: Secondary | ICD-10-CM

## 2024-03-08 DIAGNOSIS — E785 Hyperlipidemia, unspecified: Secondary | ICD-10-CM

## 2024-03-08 DIAGNOSIS — G4733 Obstructive sleep apnea (adult) (pediatric): Secondary | ICD-10-CM

## 2024-03-08 DIAGNOSIS — F1021 Alcohol dependence, in remission: Secondary | ICD-10-CM

## 2024-03-08 DIAGNOSIS — Z8719 Personal history of other diseases of the digestive system: Secondary | ICD-10-CM

## 2024-03-08 DIAGNOSIS — I130 Hypertensive heart and chronic kidney disease with heart failure and stage 1 through stage 4 chronic kidney disease, or unspecified chronic kidney disease: Secondary | ICD-10-CM | POA: Diagnosis not present

## 2024-03-08 DIAGNOSIS — M1A0790 Idiopathic chronic gout, unspecified ankle and foot, without tophus (tophi): Secondary | ICD-10-CM

## 2024-03-08 DIAGNOSIS — I5032 Chronic diastolic (congestive) heart failure: Secondary | ICD-10-CM | POA: Diagnosis not present

## 2024-03-08 DIAGNOSIS — E1169 Type 2 diabetes mellitus with other specified complication: Secondary | ICD-10-CM

## 2024-03-08 DIAGNOSIS — Z944 Liver transplant status: Secondary | ICD-10-CM

## 2024-03-08 DIAGNOSIS — D849 Immunodeficiency, unspecified: Secondary | ICD-10-CM

## 2024-03-08 DIAGNOSIS — J302 Other seasonal allergic rhinitis: Secondary | ICD-10-CM

## 2024-03-08 DIAGNOSIS — Z23 Encounter for immunization: Secondary | ICD-10-CM

## 2024-03-08 DIAGNOSIS — N1831 Chronic kidney disease, stage 3a: Secondary | ICD-10-CM

## 2024-03-08 DIAGNOSIS — N183 Chronic kidney disease, stage 3 unspecified: Secondary | ICD-10-CM

## 2024-03-08 DIAGNOSIS — Z1211 Encounter for screening for malignant neoplasm of colon: Secondary | ICD-10-CM

## 2024-03-08 DIAGNOSIS — K219 Gastro-esophageal reflux disease without esophagitis: Secondary | ICD-10-CM

## 2024-03-08 DIAGNOSIS — Z13820 Encounter for screening for osteoporosis: Secondary | ICD-10-CM

## 2024-03-08 DIAGNOSIS — Z0000 Encounter for general adult medical examination without abnormal findings: Secondary | ICD-10-CM

## 2024-03-08 DIAGNOSIS — Z8546 Personal history of malignant neoplasm of prostate: Secondary | ICD-10-CM

## 2024-03-08 MED ORDER — LOVASTATIN 20 MG PO TABS: 20.0000 mg | ORAL_TABLET | Freq: Every day | ORAL | 3 refills | Status: AC

## 2024-03-08 MED ORDER — ALLOPURINOL 300 MG PO TABS
300.0000 mg | ORAL_TABLET | Freq: Every day | ORAL | 3 refills | Status: AC
Start: 2024-03-08 — End: ?

## 2024-03-08 MED ORDER — FLUTICASONE PROPIONATE 50 MCG/ACT NA SUSP: 2.0000 | Freq: Every day | NASAL | 2 refills | Status: AC

## 2024-03-08 MED ORDER — METFORMIN HCL ER 500 MG PO TB24
1000.0000 mg | ORAL_TABLET | Freq: Two times a day (BID) | ORAL | 3 refills | Status: AC
Start: 2024-03-08 — End: ?

## 2024-03-08 MED ORDER — OMEPRAZOLE 40 MG PO CPDR: 40.0000 mg | DELAYED_RELEASE_CAPSULE | Freq: Every day | ORAL | 3 refills | Status: AC

## 2024-03-08 MED ORDER — ESCITALOPRAM OXALATE 10 MG PO TABS
10.0000 mg | ORAL_TABLET | Freq: Every day | ORAL | 3 refills | Status: AC
Start: 2024-03-08 — End: ?

## 2024-03-08 MED ORDER — EMPAGLIFLOZIN 25 MG PO TABS
25.0000 mg | ORAL_TABLET | Freq: Every day | ORAL | 3 refills | Status: AC
Start: 2024-03-08 — End: ?

## 2024-03-08 MED ORDER — MAGNESIUM OXIDE -MG SUPPLEMENT 400 (240 MG) MG PO TABS: 1.0000 | ORAL_TABLET | Freq: Two times a day (BID) | ORAL | 3 refills | Status: AC

## 2024-03-08 MED ORDER — TOUJEO MAX SOLOSTAR 300 UNIT/ML ~~LOC~~ SOPN: 110.0000 [IU] | PEN_INJECTOR | Freq: Every day | SUBCUTANEOUS | 3 refills | Status: AC

## 2024-03-08 MED ORDER — LOSARTAN POTASSIUM 50 MG PO TABS: 50.0000 mg | ORAL_TABLET | Freq: Every day | ORAL | 3 refills | Status: AC

## 2024-03-08 MED ORDER — ASPIR-LOW 81 MG PO TBEC: 81.0000 mg | DELAYED_RELEASE_TABLET | Freq: Every day | ORAL | 3 refills | Status: AC

## 2024-03-08 NOTE — Progress Notes (Addendum)
 Ph: [phone] Fax: [phone]   Patient ID: Colton Porter Stain, male    DOB: April 21, 1966, 58 y.o.   MRN: [id number]  This visit was conducted in person.  BP 126/64   Pulse 86   Temp 98.6 F (37 C) (Oral)   Ht 5' 6 (1.676 m)   Wt 241 lb (109.3 kg)   SpO2 98%   BMI 38.90 kg/m    CC: CPE Subjective:   HPI: Colton Porter is a 58 y.o. male presenting on 03/08/2024 for Health Maintenance   Saw health advisor Tuesday for medicare wellness visit. Note reviewed.   No results found.  Flowsheet Row Office Visit from 03/08/2024 in [facility] [place]  PHQ-2 Total Score 0       03/08/2024   11:57 AM 03/05/2024    9:49 AM 03/07/2023    2:20 PM 11/23/2022    8:30 AM 03/01/2022    8:07 AM  Fall Risk   Falls in the past year? 0 0 0 0 1  Number falls in past yr: 0 0   0  Injury with Fall? 0 0   0  Risk for fall due to : No Fall Risks No Fall Risks     Follow up Falls evaluation completed Falls evaluation completed         Liver transplant recipient (12/2015) on mycophenolate and prograf  (immunocompromised). Sees [facility] ([name] Drazek) Q6 mo.  Diabetic on jardiance , toujeo  110u daily, metformin  XR.  Known chronic HFpEF, CKD stage 3. Furosemide  recently decreased to 20mg  due to CKD.    Prostate cancer diagnosed 02/2021 - on Eligard  depo Q6mo (completed) and completed 40 treatments of external beam radiation therapy on 06/2021. Sees Dr Penne and Chrystal regularly.    OSA on CPAP - last saw pulm 2019. DME through [employer] [facility]. Uses consistently about 9 hours a night, benefits from this on autoCPAP.   DM - previously saw endo, currently on jardiance  25mg  daily, toujeo  110u daily, metformin  XR 500mg  BID. Trulicity  was unaffordable and caused diarrhea. Ozempic  - decided not to try this. Latest prescribed freestyle libre 2 through our CCM program - didn't like this as it fell out easily. Not checking sugars.   Ongoing L hip pain. Consider ortho eval.    Preventative: COLONOSCOPY 08/2013 hyperplastic polyps, hemorrhoids Colton Porter). No blood in stool. Agrees to [employer].  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  Lung cancer screening - not eligible  DEXA - discussed osteoporosis screening in liver transplant history. Will order to [place]  Flu shot yearly COVID vaccine - Pfizer 10/2019, 11/2019, booster x2 04/2020, 11/2020, considering bivalent booster Pneumovax 2016. Prevnar-20 01/2021.  Td 2008, Tdap 2017, 09/2020  Shingrix - 04/2022, 06/2022 Hep A/B series completed  Advanced directives: does not have at home. Wife Colton Porter then daughter Colton Porter would be HCPOA - has discussed wishes with them. Full code, but would not want prolonged life support if terminal condition. Advanced directive packet previously provided.  Seat belt use  Sunscreen use. No changing moles on skin.  Non smoker. Quit chewing tobacco 10/10/2015.  Alcohol - abstinent since 10/10/2014.  Dentist - yearly [facility]) Eye exam - yearly - due - will call [facility] Bowel - no constipation Bladder - no incontinence    Lives with wife Occupation: full disability after cirrhosis dx, working part time Activity: yardwork  Diet: good water , good fruit intake  Relevant past medical, surgical, family and social history reviewed and updated as indicated. Interim medical history since our last visit reviewed. Allergies and medications reviewed and updated. Outpatient Medications Prior to Visit  Medication Sig Dispense Refill   Accu-Chek FastClix Lancets MISC Use as instructed to check blood sugar once a day 102 each 3   acetaminophen  (TYLENOL ) 500 MG tablet Take 500 mg by mouth every 6 (six) hours as needed for mild pain.     B-D UF III MINI PEN NEEDLES 31G X 5 MM MISC USE TO INJECT INSULIN  DAILY 100 each 3   Blood Glucose Monitoring Suppl (ACCU-CHEK GUIDE) w/Device KIT 1 Units by  Does not apply route as directed. 1 kit 0   Cholecalciferol (VITAMIN D3) 2000 units TABS Take 1 tablet by mouth at bedtime.     cyanocobalamin  (VITAMIN B12) 500 MCG tablet Take 1 tablet (500 mcg total) by mouth daily.     docusate sodium (COLACE) 100 MG capsule Take 100 mg by mouth daily as needed for mild constipation.      furosemide  (LASIX ) 20 MG tablet Take 1 tablet (20 mg total) by mouth daily. You can take an extra lasix  if leg edema or weight gain 2-3 lbs overnight. 90 tablet 3   glucose blood (ACCU-CHEK GUIDE) test strip Use as instructed to check blood sugar once a day 100 each 3   loratadine  (CLARITIN ) 10 MG tablet Take 10 mg by mouth daily.     Multiple Vitamins-Minerals ([employer] SPECTRAVITE PO) Take 1 tablet by mouth at bedtime.     mycophenolate (MYFORTIC) 360 MG TBEC EC tablet Take 360 mg by mouth 2 (two) times daily.     potassium chloride  (KLOR-CON  M) 10 MEQ tablet Take 1 tablet (10 mEq total) by mouth daily. 90 tablet 3   tacrolimus  (PROGRAF ) 1 MG capsule Take 1-2 mg by mouth See admin instructions. Take 2 mg by mouth in the morning, then take 1 mg  by mouth in the evening     tadalafil  (CIALIS ) 20 MG tablet Take 1 tablet (20 mg total) by mouth daily as needed for erectile dysfunction. 30 tablet 11   allopurinol  (ZYLOPRIM ) 300 MG tablet Take 1 tablet (300 mg total) by mouth daily. 90 tablet 4   ASPIR-LOW 81 MG EC tablet Take 1 tablet (81 mg total) by mouth daily. 90 tablet 3   empagliflozin  (JARDIANCE ) 25 MG TABS tablet Take 1 tablet (25 mg total) by mouth daily. 90 tablet 1   escitalopram  (LEXAPRO ) 10 MG tablet Take 1 tablet (10 mg total) by mouth daily. 90 tablet 4   fluticasone  (FLONASE ) 50 MCG/ACT nasal spray SPRAY 2 SPRAYS INTO EACH NOSTRIL EVERY DAY 48 mL 2   insulin  glargine, 2 Unit Dial, (TOUJEO  MAX SOLOSTAR) 300 UNIT/ML Solostar Pen Inject 110 Units into the skin daily. 45 mL 1   losartan  (COZAAR ) 50 MG tablet Take 1 tablet (50 mg total) by mouth daily. 90 tablet 4    lovastatin  (MEVACOR ) 20 MG tablet Take 1 tablet (20 mg total) by mouth at bedtime. 90 tablet 4   magnesium  oxide (MAG-OX) 400 (240 Mg) MG tablet TAKE 1 TABLET BY MOUTH TWICE A DAY 180 tablet 1   metFORMIN  (GLUCOPHAGE -XR) 500 MG 24 hr tablet TAKE 2 TABLETS BY MOUTH 2 TIMES DAILY WITH A MEAL. 360 tablet 3   omeprazole  (PRILOSEC) 40 MG capsule Take 1 capsule (40 mg total) by mouth daily. 90 capsule 4   Omega-3 Fatty Acids (FISH OIL  PO) Take 1  tablet by mouth at bedtime. (Patient not taking: Reported on 03/08/2024)     benzonatate  (TESSALON ) 100 MG capsule Take 1 capsule (100 mg total) by mouth 2 (two) times daily as needed for cough. 30 capsule 0   oseltamivir  (TAMIFLU ) 75 MG capsule Take 1 capsule (75 mg total) by mouth 2 (two) times daily. 10 capsule 0   No facility-administered medications prior to visit.     Per HPI unless specifically indicated in ROS section below Review of Systems  Constitutional:  Negative for activity change, appetite change, chills, fatigue, fever and unexpected weight change.  HENT:  Negative for hearing loss.   Eyes:  Negative for visual disturbance.  Respiratory:  Positive for shortness of breath (exertional). Negative for cough, chest tightness and wheezing.   Cardiovascular:  Negative for chest pain, palpitations and leg swelling.  Gastrointestinal:  Negative for abdominal distention, abdominal pain, blood in stool, constipation, diarrhea, nausea and vomiting.  Genitourinary:  Negative for difficulty urinating and hematuria.  Musculoskeletal:  Negative for arthralgias, myalgias and neck pain.       Toe numbness  Skin:  Negative for rash.  Neurological:  Positive for headaches. Negative for dizziness, seizures and syncope.  Hematological:  Negative for adenopathy. Bruises/bleeds easily.  Psychiatric/Behavioral:  Negative for dysphoric mood. The patient is not nervous/anxious.     Objective:  BP 126/64   Pulse 86   Temp 98.6 F (37 C) (Oral)   Ht 5' 6  (1.676 m)   Wt 241 lb (109.3 kg)   SpO2 98%   BMI 38.90 kg/m   Wt Readings from Last 3 Encounters:  03/08/24 241 lb (109.3 kg)  03/05/24 240 lb (108.9 kg)  01/23/24 242 lb (109.8 kg)      Physical Exam Vitals and nursing note reviewed.  Constitutional:      General: He is not in acute distress.    Appearance: Normal appearance. He is well-developed. He is not ill-appearing.  HENT:     Head: Normocephalic and atraumatic.     Right Ear: Hearing, tympanic membrane, ear canal and external ear normal.     Left Ear: Hearing, tympanic membrane, ear canal and external ear normal.     Mouth/Throat:     Mouth: Mucous membranes are moist.     Pharynx: Oropharynx is clear. No oropharyngeal exudate or posterior oropharyngeal erythema.  Eyes:     General: No scleral icterus.    Extraocular Movements: Extraocular movements intact.     Conjunctiva/sclera: Conjunctivae normal.     Pupils: Pupils are equal, round, and reactive to light.  Neck:     Thyroid : No thyroid  mass or thyromegaly.     Vascular: No carotid bruit.  Cardiovascular:     Rate and Rhythm: Normal rate and regular rhythm.     Pulses: Normal pulses.          Radial pulses are 2+ on the right side and 2+ on the left side.     Heart sounds: Normal heart sounds. No murmur heard. Pulmonary:     Effort: Pulmonary effort is normal. No respiratory distress.     Breath sounds: Normal breath sounds. No wheezing, rhonchi or rales.  Abdominal:     General: Bowel sounds are normal. There is no distension.     Palpations: Abdomen is soft. There is no mass.     Tenderness: There is no abdominal tenderness. There is no guarding or rebound.     Hernia: No hernia is present.  Musculoskeletal:  General: Normal range of motion.     Cervical back: Normal range of motion and neck supple.     Right lower leg: No edema.     Left lower leg: No edema.  Lymphadenopathy:     Cervical: No cervical adenopathy.  Skin:    General: Skin is  warm and dry.     Findings: No rash.  Neurological:     General: No focal deficit present.     Mental Status: He is alert and oriented to person, place, and time.  Psychiatric:        Mood and Affect: Mood normal.        Behavior: Behavior normal.        Thought Content: Thought content normal.        Judgment: Judgment normal.       Results for orders placed or performed in visit on 03/01/24  Vitamin B12   Collection Time: 03/01/24  7:58 AM  Result Value Ref Range   Vitamin B-12 288 211 - 911 pg/mL  Magnesium    Collection Time: 03/01/24  7:58 AM  Result Value Ref Range   Magnesium  1.9 1.5 - 2.5 mg/dL  Parathyroid  hormone, intact (no Ca)   Collection Time: 03/01/24  7:58 AM  Result Value Ref Range   PTH 15 (L) 16 - 77 pg/mL  Microalbumin / creatinine urine ratio   Collection Time: 03/01/24  7:58 AM  Result Value Ref Range   Microalb, Ur 2.1 (H) 0.0 - 1.9 mg/dL   Creatinine,U 862.8 mg/dL   Microalb Creat Ratio 15.4 0.0 - 30.0 mg/g  VITAMIN D  25 Hydroxy (Vit-D Deficiency, Fractures)   Collection Time: 03/01/24  7:58 AM  Result Value Ref Range   VITD 41.86 30.00 - 100.00 ng/mL  Phosphorus   Collection Time: 03/01/24  7:58 AM  Result Value Ref Range   Phosphorus 2.9 2.3 - 4.6 mg/dL  Comprehensive metabolic panel with GFR   Collection Time: 03/01/24  7:58 AM  Result Value Ref Range   Sodium 140 135 - 145 mEq/L   Potassium 4.5 3.5 - 5.1 mEq/L   Chloride 102 96 - 112 mEq/L   CO2 28 19 - 32 mEq/L   Glucose, Bld 162 (H) 70 - 99 mg/dL   BUN 14 6 - 23 mg/dL   Creatinine, Ser 8.69 0.40 - 1.50 mg/dL   Total Bilirubin 0.6 0.2 - 1.2 mg/dL   Alkaline Phosphatase 77 39 - 117 U/L   AST 14 0 - 37 U/L   ALT 18 0 - 53 U/L   Total Protein 7.1 6.0 - 8.3 g/dL   Albumin  4.2 3.5 - 5.2 g/dL   GFR 38.99 >39.99 mL/min   Calcium 9.3 8.4 - 10.5 mg/dL  Lipid panel   Collection Time: 03/01/24  7:58 AM  Result Value Ref Range   Cholesterol 91 0 - 200 mg/dL   Triglycerides (H) 0.0 - 149.0  mg/dL    538.9 Triglyceride is over 400; calculations on Lipids are invalid.   HDL 28.60 (L) >39.00 mg/dL   VLDL 07.7 (H) 0.0 - 59.9 mg/dL   Total CHOL/HDL Ratio 3    NonHDL 62.16   Hemoglobin A1c   Collection Time: 03/01/24  7:58 AM  Result Value Ref Range   Hgb A1c MFr Bld 7.2 (H) 4.6 - 6.5 %  LDL cholesterol, direct   Collection Time: 03/01/24  7:58 AM  Result Value Ref Range   Direct LDL 34.0 mg/dL   Lab Results  Component  Value Date   LABURIC 4.8 02/22/2022   Lab Results  Component Value Date   WBC 7.6 01/23/2024   HGB 16.0 01/23/2024   HCT 47.5 01/23/2024   MCV 87 01/23/2024   PLT 156 01/23/2024    Lab Results  Component Value Date   PSA1 <0.1 10/26/2023   PSA1 <0.1 10/26/2022   PSA1 <0.1 04/28/2022   PSA 0.07 (L) 05/02/2023   PSA 14.93 (H) 02/08/2021   PSA 8.22 (H) 01/22/2020   Assessment & Plan:   Problem List Items Addressed This Visit     Dyslipidemia associated with type 2 diabetes mellitus (HCC) (Chronic)   Chronic, LDL at goal on lovastatin  20mg  daily however triglycerides continue trending up.  Rec limit carbs to improve triglyceride  control.  He will restart fish oil .  He has also been referred to lipid clinic at cardiology office.  The ASCVD Risk score (Arnett DK, et al., 2019) failed to calculate for the following reasons:   The valid total cholesterol range is 130 to 320 mg/dL       Relevant Medications   ASPIR-LOW 81 MG tablet   empagliflozin  (JARDIANCE ) 25 MG TABS tablet   insulin  glargine, 2 Unit Dial, (TOUJEO  MAX SOLOSTAR) 300 UNIT/ML Solostar Pen   losartan  (COZAAR ) 50 MG tablet   lovastatin  (MEVACOR ) 20 MG tablet   metFORMIN  (GLUCOPHAGE -XR) 500 MG 24 hr tablet   Routine general medical examination at a health care facility - Primary (Chronic)   Preventative protocols reviewed and updated unless pt declined. Discussed healthy diet and lifestyle.  Discussed due for colon cancer screening - he agrees to [employer].       Essential  hypertension (Chronic)   Chronic, stable on current regimen - continue.       Relevant Medications   ASPIR-LOW 81 MG tablet   losartan  (COZAAR ) 50 MG tablet   lovastatin  (MEVACOR ) 20 MG tablet   Type 2 diabetes mellitus with other specified complication (HCC) (Chronic)   Chronic, adequate control with A1c mildly increased to 7.2%.  Continue current regimen including jardiance  toujeo  300u/mL, and metformin  XR.       Relevant Medications   ASPIR-LOW 81 MG tablet   empagliflozin  (JARDIANCE ) 25 MG TABS tablet   insulin  glargine, 2 Unit Dial, (TOUJEO  MAX SOLOSTAR) 300 UNIT/ML Solostar Pen   losartan  (COZAAR ) 50 MG tablet   lovastatin  (MEVACOR ) 20 MG tablet   metFORMIN  (GLUCOPHAGE -XR) 500 MG 24 hr tablet   Status post liver transplant (HCC) (Chronic)   Advanced directives, counseling/discussion (Chronic)   Advanced directives: does not have at home. Wife Colton Porter then daughter Colton Porter would be HCPOA - has discussed wishes with them. Full code, but would not want prolonged life support if terminal condition. Advanced directive packet previously provided.       Immunosuppressed status (HCC) (Chronic)   Allergic rhinitis   Flonase  refilled.       Relevant Medications   fluticasone  (FLONASE ) 50 MCG/ACT nasal spray   GERD   Cahronic, stable on omeprazole  40mg  daily - continue.       Relevant Medications   omeprazole  (PRILOSEC) 40 MG capsule   Severe obesity (BMI 35.0-39.9) with comorbidity (HCC)   Reviewed healthy diet and lifestyle changes to effect sustainable weight loss.   Obesity complicated by comorbidities of diabetes, hypertension, CKD, CHF      Relevant Medications   empagliflozin  (JARDIANCE ) 25 MG TABS tablet   insulin  glargine, 2 Unit Dial, (TOUJEO  MAX SOLOSTAR) 300 UNIT/ML Solostar Pen  metFORMIN  (GLUCOPHAGE -XR) 500 MG 24 hr tablet   OSA on CPAP   Chronic OSA on CPAP.       History of alcohol dependence (HCC)   Abstinent since 09/2014.       (HFpEF) heart  failure with preserved ejection fraction (HCC)   Chronic, stable period. Appreciate cardiology care.  Lasix  dose recently dropped from 40mg  to 20mg  with extra PRN.  Continue losartan , Jardiance        Relevant Medications   ASPIR-LOW 81 MG tablet   empagliflozin  (JARDIANCE ) 25 MG TABS tablet   losartan  (COZAAR ) 50 MG tablet   lovastatin  (MEVACOR ) 20 MG tablet   History of cirrhosis of liver   Hypomagnesemia   Continue magnesium  400mg  bid OTC. Mg levels normal. Continues daily PPI      Depression, major, single episode, moderate (HCC)   Chronic, stable period on lexapro  10mg  daily - continue this.       Relevant Medications   escitalopram  (LEXAPRO ) 10 MG tablet   CKD (chronic kidney disease) stage 3, GFR 30-59 ml/min (HCC)   Chronic, slight improvement with latest GFR 61 in setting of lowering lasix  dose. Continue lasix  20mg  daily with extra PRN.       Chronic gout   Continue allopurinol  300mg  daily. No recent gout flare. Update urate levels next labwork.       Relevant Medications   allopurinol  (ZYLOPRIM ) 300 MG tablet   Osteoporosis screening   Never completed previously ordered DEXA - will discuss at future visit.       History of prostate cancer   S/p treatment with brachytherapy and Eligard .  PSA remains undetectable. Continues regular urology and rad onc f/u.      Other Visit Diagnoses       Screening for colorectal cancer       Relevant Orders   Cologuard     Encounter for immunization       Relevant Orders   Flu vaccine trivalent PF, 6mos and older(Flulaval,Afluria,Fluarix,Fluzone) (Completed)        Meds ordered this encounter  Medications   allopurinol  (ZYLOPRIM ) 300 MG tablet    Sig: Take 1 tablet (300 mg total) by mouth daily.    Dispense:  90 tablet    Refill:  3   ASPIR-LOW 81 MG tablet    Sig: Take 1 tablet (81 mg total) by mouth daily.    Dispense:  90 tablet    Refill:  3   empagliflozin  (JARDIANCE ) 25 MG TABS tablet    Sig: Take 1  tablet (25 mg total) by mouth daily.    Dispense:  90 tablet    Refill:  3    Copay card: BIN: [id number]; PCN: [id number]; GRP: [id number]; PI:[id number]   escitalopram  (LEXAPRO ) 10 MG tablet    Sig: Take 1 tablet (10 mg total) by mouth daily.    Dispense:  90 tablet    Refill:  3   fluticasone  (FLONASE ) 50 MCG/ACT nasal spray    Sig: Place 2 sprays into both nostrils daily.    Dispense:  48 mL    Refill:  2   insulin  glargine, 2 Unit Dial, (TOUJEO  MAX SOLOSTAR) 300 UNIT/ML Solostar Pen    Sig: Inject 110 Units into the skin daily.    Dispense:  45 mL    Refill:  3   losartan  (COZAAR ) 50 MG tablet    Sig: Take 1 tablet (50 mg total) by mouth daily.    Dispense:  90 tablet  Refill:  3   lovastatin  (MEVACOR ) 20 MG tablet    Sig: Take 1 tablet (20 mg total) by mouth at bedtime.    Dispense:  90 tablet    Refill:  3   magnesium  oxide (MAG-OX) 400 (240 Mg) MG tablet    Sig: Take 1 tablet (400 mg total) by mouth 2 (two) times daily.    Dispense:  180 tablet    Refill:  3   metFORMIN  (GLUCOPHAGE -XR) 500 MG 24 hr tablet    Sig: Take 2 tablets (1,000 mg total) by mouth 2 (two) times daily with a meal.    Dispense:  360 tablet    Refill:  3   omeprazole  (PRILOSEC) 40 MG capsule    Sig: Take 1 capsule (40 mg total) by mouth daily.    Dispense:  90 capsule    Refill:  3    Orders Placed This Encounter  Procedures   Flu vaccine trivalent PF, 6mos and older(Flulaval,Afluria,Fluarix,Fluzone)   Cologuard    Patient Instructions  Flu shot today  We will set you up for Cologuard  Call  Eye to schedule diabetic eye exam.  Double check on b12 dosing at home (over the counter).  Restart fish oil  daily.  Check on dosing of metformin  XR - you should be on 500mg  2 tablets twice daily  Good to see you today Return as needed or in 4-6 months for follow up visit  Keep cholesterol clinic appointment  Follow up plan: Return in about 4 months (around 07/08/2024) for follow up  visit.  Anton Blas, MD

## 2024-03-08 NOTE — Assessment & Plan Note (Addendum)
Advanced directives: does not have at home. Wife Colton Porter then daughter Colton Porter would be HCPOA. Full code, but would not want prolonged life support if terminal condition. Advanced directive packet previously provided.

## 2024-03-08 NOTE — Patient Instructions (Addendum)
 Flu shot today  We will set you up for Cologuard  Call [facility] to schedule diabetic eye exam.  Double check on b12 dosing at home (over the counter).  Restart fish oil  daily.  Check on dosing of metformin  XR - you should be on 500mg  2 tablets twice daily  Good to see you today Return as needed or in 4-6 months for follow up visit  Keep cholesterol clinic appointment

## 2024-03-08 NOTE — Assessment & Plan Note (Signed)
 Preventative protocols reviewed and updated unless pt declined. Discussed healthy diet and lifestyle.

## 2024-03-09 ENCOUNTER — Encounter: Payer: Self-pay | Admitting: Family Medicine

## 2024-03-09 NOTE — Assessment & Plan Note (Signed)
 Flonase refilled.

## 2024-03-09 NOTE — Assessment & Plan Note (Addendum)
 Continue allopurinol  300mg  daily. No recent gout flare. Update urate levels next labwork.

## 2024-03-09 NOTE — Assessment & Plan Note (Signed)
 Chronic, adequate control with A1c mildly increased to 7.2%.  Continue current regimen including jardiance  toujeo  300u/mL, and metformin  XR.

## 2024-03-09 NOTE — Assessment & Plan Note (Signed)
 Chronic OSA on CPAP.

## 2024-03-09 NOTE — Assessment & Plan Note (Addendum)
 Continue magnesium  400mg  bid OTC. Mg levels normal. Continues daily PPI

## 2024-03-09 NOTE — Assessment & Plan Note (Signed)
 Chronic, stable period. Appreciate cardiology care.  Lasix  dose recently dropped from 40mg  to 20mg  with extra PRN.  Continue losartan , Jardiance 

## 2024-03-09 NOTE — Assessment & Plan Note (Signed)
Abstinent since 09/2014. 

## 2024-03-09 NOTE — Assessment & Plan Note (Signed)
 Reviewed healthy diet and lifestyle changes to effect sustainable weight loss.   Obesity complicated by comorbidities of diabetes, hypertension, CKD, CHF

## 2024-03-09 NOTE — Assessment & Plan Note (Addendum)
 Chronic, LDL at goal on lovastatin  20mg  daily however triglycerides continue trending up.  Rec limit carbs to improve triglyceride  control.  He will restart fish oil .  He has also been referred to lipid clinic at cardiology office.  The ASCVD Risk score (Arnett DK, et al., 2019) failed to calculate for the following reasons:   The valid total cholesterol range is 130 to 320 mg/dL

## 2024-03-09 NOTE — Assessment & Plan Note (Signed)
 Never completed previously ordered DEXA - will discuss at future visit.

## 2024-03-09 NOTE — Assessment & Plan Note (Signed)
 Chronic, slight improvement with latest GFR 61 in setting of lowering lasix  dose. Continue lasix  20mg  daily with extra PRN.

## 2024-03-09 NOTE — Assessment & Plan Note (Signed)
 Chronic, stable period on lexapro  10mg  daily - continue this.

## 2024-03-09 NOTE — Assessment & Plan Note (Deleted)
 S/p treatment with brachytherapy and Eligard .  PSA remains undetectable. Continues regular urology and rad onc f/u.

## 2024-03-09 NOTE — Assessment & Plan Note (Signed)
 Chronic, stable on current regimen - continue.

## 2024-03-09 NOTE — Assessment & Plan Note (Signed)
 Cahronic, stable on omeprazole  40mg  daily - continue.

## 2024-03-10 ENCOUNTER — Encounter: Payer: Self-pay | Admitting: Family Medicine

## 2024-03-15 DIAGNOSIS — E119 Type 2 diabetes mellitus without complications: Secondary | ICD-10-CM | POA: Diagnosis not present

## 2024-03-15 DIAGNOSIS — Z1211 Encounter for screening for malignant neoplasm of colon: Secondary | ICD-10-CM | POA: Diagnosis not present

## 2024-03-18 ENCOUNTER — Encounter: Payer: Self-pay | Admitting: Family Medicine

## 2024-03-18 DIAGNOSIS — E538 Deficiency of other specified B group vitamins: Secondary | ICD-10-CM | POA: Insufficient documentation

## 2024-03-18 DIAGNOSIS — Z8546 Personal history of malignant neoplasm of prostate: Secondary | ICD-10-CM | POA: Insufficient documentation

## 2024-03-18 NOTE — Assessment & Plan Note (Signed)
 S/p treatment with brachytherapy and Eligard .  PSA remains undetectable. Continues regular urology and rad onc f/u.

## 2024-03-25 DIAGNOSIS — Z1212 Encounter for screening for malignant neoplasm of rectum: Secondary | ICD-10-CM | POA: Diagnosis not present

## 2024-03-25 DIAGNOSIS — Z1211 Encounter for screening for malignant neoplasm of colon: Secondary | ICD-10-CM | POA: Diagnosis not present

## 2024-03-26 ENCOUNTER — Institutional Professional Consult (permissible substitution) (HOSPITAL_BASED_OUTPATIENT_CLINIC_OR_DEPARTMENT_OTHER): Admitting: Internal Medicine

## 2024-03-29 ENCOUNTER — Ambulatory Visit: Payer: Self-pay | Admitting: Family Medicine

## 2024-03-29 LAB — COLOGUARD: COLOGUARD: NEGATIVE

## 2024-03-31 ENCOUNTER — Other Ambulatory Visit: Payer: Self-pay | Admitting: Family Medicine

## 2024-03-31 DIAGNOSIS — M1A0790 Idiopathic chronic gout, unspecified ankle and foot, without tophus (tophi): Secondary | ICD-10-CM

## 2024-03-31 DIAGNOSIS — F321 Major depressive disorder, single episode, moderate: Secondary | ICD-10-CM

## 2024-03-31 DIAGNOSIS — E1169 Type 2 diabetes mellitus with other specified complication: Secondary | ICD-10-CM

## 2024-03-31 DIAGNOSIS — K219 Gastro-esophageal reflux disease without esophagitis: Secondary | ICD-10-CM

## 2024-03-31 DIAGNOSIS — I10 Essential (primary) hypertension: Secondary | ICD-10-CM

## 2024-05-02 ENCOUNTER — Inpatient Hospital Stay: Payer: Medicare HMO | Attending: Oncology

## 2024-05-02 DIAGNOSIS — C61 Malignant neoplasm of prostate: Secondary | ICD-10-CM | POA: Diagnosis not present

## 2024-05-02 LAB — PSA: Prostatic Specific Antigen: 0.04 ng/mL (ref 0.00–4.00)

## 2024-05-03 ENCOUNTER — Other Ambulatory Visit

## 2024-05-09 ENCOUNTER — Ambulatory Visit
Admission: RE | Admit: 2024-05-09 | Discharge: 2024-05-09 | Disposition: A | Discharge status: Home / Self Care | Payer: Medicare HMO | Source: Ambulatory Visit | Attending: Radiation Oncology | Admitting: Radiation Oncology

## 2024-05-09 ENCOUNTER — Encounter: Payer: Self-pay | Admitting: Radiation Oncology

## 2024-05-09 VITALS — BP 145/81 | HR 86 | Temp 98.0°F | Resp 16 | Ht 66.0 in | Wt 243.8 lb

## 2024-05-09 DIAGNOSIS — C61 Malignant neoplasm of prostate: Secondary | ICD-10-CM | POA: Insufficient documentation

## 2024-05-09 DIAGNOSIS — Z191 Hormone sensitive malignancy status: Secondary | ICD-10-CM | POA: Diagnosis not present

## 2024-05-09 DIAGNOSIS — Z923 Personal history of irradiation: Secondary | ICD-10-CM | POA: Diagnosis not present

## 2024-05-09 NOTE — Progress Notes (Signed)
 Radiation Oncology Follow up Note  Name: Colton Porter   Date:   05/09/2024 MRN:  [id number] DOB: May 13, 1966    This 57 y.o. 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  REFERRING PROVIDER: Rilla Baller, MD  HPI: Patient is a 58 year old male now out over 2 years having completed IMRT radiation therapy for Gleason 7 adenocarcinoma the prostate.  Seen today in routine follow-up he is doing well.  He specifically denies any increased lower urinary tract symptoms diarrhea or fatigue.  His most recent PSA is 0.04 showing excellent biochemical control of his disease..  COMPLICATIONS OF TREATMENT: none  FOLLOW UP COMPLIANCE: keeps appointments   PHYSICAL EXAM:  BP (!) 145/81   Pulse 86   Temp 98 F (36.7 C)   Resp 16   Ht 5' 6 (1.676 m)   Wt 243 lb 12.8 oz (110.6 kg)   BMI 39.35 kg/m  Well-developed well-nourished patient in NAD. HEENT reveals PERLA, EOMI, discs not visualized.  Oral cavity is clear. No oral mucosal lesions are identified. Neck is clear without evidence of cervical or supraclavicular adenopathy. Lungs are clear to A&P. Cardiac examination is essentially unremarkable with regular rate and rhythm without murmur rub or thrill. Abdomen is benign with no organomegaly or masses noted. Motor sensory and DTR levels are equal and symmetric in the upper and lower extremities. Cranial nerves II through XII are grossly intact. Proprioception is intact. No peripheral adenopathy or edema is identified. No motor or sensory levels are noted. Crude visual fields are within normal range.  RADIOLOGY RESULTS: No current films to review  PLAN: Present time patient is doing well with no evidence of disease under excellent biochemical control of his prostate cancer now out over 2 years.  I will see him back in 1 year for follow-up.  May turn follow-up care over to urology at some  point should he reestablish care with one of the urologists in Dr. Bjorn practice.  Otherwise patient knows to call at anytime with any concerns.  I would like to take this opportunity to thank you for allowing me to participate in the care of your patient.SABRA Marcey Penton, MD

## 2024-05-10 ENCOUNTER — Telehealth: Payer: Self-pay | Admitting: Family Medicine

## 2024-05-10 DIAGNOSIS — G8929 Other chronic pain: Secondary | ICD-10-CM

## 2024-05-10 NOTE — Telephone Encounter (Signed)
 Copied from CRM #[id number]. Topic: Referral - Request for Referral >> May 10, 2024 10:34 AM Terri MATSU wrote: Did the patient discuss referral with their provider in the last year? Yes (If No - schedule appointment) (If Yes - send message)  Appointment offered? No  Type of order/referral and detailed reason for visit: Orthopedic for hip  Preference of office, provider, location: Dr.Micheal Hu  If referral order, have you been seen by this specialty before? No (If Yes, this issue or another issue? When? Where?  Can we respond through MyChart? Yes

## 2024-05-13 NOTE — Telephone Encounter (Signed)
 Referral placed.

## 2024-05-16 ENCOUNTER — Telehealth: Payer: Self-pay | Admitting: Family Medicine

## 2024-05-16 DIAGNOSIS — G4733 Obstructive sleep apnea (adult) (pediatric): Secondary | ICD-10-CM

## 2024-05-16 NOTE — Telephone Encounter (Signed)
 Copied from CRM #[id number]. Topic: Clinical - Order For Equipment >> May 15, 2024  4:29 PM Tysheama G wrote: Reason for CRM: Patient needs a new prescription for his CPAP supplies. Callback number  [phone]

## 2024-05-17 NOTE — Addendum Note (Signed)
 Addended by: RILLA BALLER on: 05/17/2024 05:38 PM   Modules accepted: Orders

## 2024-05-17 NOTE — Telephone Encounter (Addendum)
 OSA on auto CPAP 5-20 cm H2O, without oxygen . DME through [facility]. Last saw pulm 2019, last HST 07/2023. Received new machine/mask through [facility] 02/2023.  Uses CPAP at least 7-8 hours per night. Sleeping well with this, through the night, wakes up feeling rested.  Equipment info: AirSense 10 Auto with pressure setting 5-20, mask type F20, mask size med   I have ordered CPAP supplies through [employer]  I need to review CPAP usage from [employer] - have not received this. Can we request this?

## 2024-05-21 DIAGNOSIS — E119 Type 2 diabetes mellitus without complications: Secondary | ICD-10-CM | POA: Diagnosis not present

## 2024-05-21 LAB — HM DIABETES EYE EXAM

## 2024-05-21 NOTE — Telephone Encounter (Signed)
 Called they will fax report to our office.

## 2024-05-27 NOTE — Telephone Encounter (Unsigned)
 Copied from CRM #[id number]. Topic: Referral - Status >> May 27, 2024  1:37 PM Thersia BROCKS wrote: Reason for CRM: Patient called in regarding ortho referral , would like the status, patient stated he has been waiting for 3 weeks now would like a callback regarding this

## 2024-05-30 NOTE — Telephone Encounter (Unsigned)
 Copied from CRM [id number]. Topic: Referral - Question >> May 30, 2024  1:44 PM Burnard DEL wrote: Reason for CRM: Patient called in to check on status of referral that was sent out to orthopedic surgery for him. The referral still has a status of pending and patient would like to know the reasoning why? Referral was placed on 05/13/2024 by provider.

## 2024-05-31 ENCOUNTER — Encounter: Payer: Self-pay | Admitting: Family Medicine

## 2024-06-06 ENCOUNTER — Ambulatory Visit: Payer: Self-pay | Admitting: Family Medicine

## 2024-06-06 DIAGNOSIS — G4733 Obstructive sleep apnea (adult) (pediatric): Secondary | ICD-10-CM

## 2024-06-06 NOTE — Telephone Encounter (Addendum)
 Unable to reach pt at this time, left voicemail instructing pt to call back to verify  Please provide message from provider/office when call is returned from patient.

## 2024-06-06 NOTE — Telephone Encounter (Signed)
 Correction, when speaking to pt on another call (regarding his ortho referral), he clarified that he did receive his new Cpap supplies but that he is currently still using his old supplies as he has not set up the new yet.

## 2024-06-06 NOTE — Assessment & Plan Note (Signed)
 Reviewed CPAP compliance report from 8/27-11/24/2025: 100% compliance with use >4 hours/night, autoCPAP 5-20 cmH2O with median pressure of 9.9, AHI dropped to 2.3. He continues receiving benefit from CPAP use for OSA with full compliance.

## 2024-06-12 NOTE — Telephone Encounter (Signed)
 This referral was sent directly to [facility] and bypassed me.  The referral coordinator in that office processed the referral and scheduled the patient for 06/18/24 with Dr Jerri.   Nothing further needed.

## 2024-06-18 ENCOUNTER — Other Ambulatory Visit: Payer: Self-pay

## 2024-06-18 ENCOUNTER — Ambulatory Visit: Admitting: Orthopaedic Surgery

## 2024-06-18 DIAGNOSIS — M25552 Pain in left hip: Secondary | ICD-10-CM | POA: Diagnosis not present

## 2024-06-18 NOTE — Progress Notes (Signed)
 "  Office Visit Note   Patient: Colton Porter           Date of Birth: 02/28/1966           MRN: [id number] Visit Date: 06/18/2024              Requested by: Rilla Baller, MD [address] [place],  [place] [address] PCP: Rilla Baller, MD   Assessment & Plan: Visit Diagnoses:  1. Pain in left hip     Plan: History of Present Illness Colton Porter is a 58 year old male with diabetic neuropathy who presents for evaluation of chronic left lateral hip pain.  He has had left lateral hip and buttock pain for about two years without groin pain. The pain intermittently radiates to the gluteal region and worsens with lifting, cabinet work, and being pulled by his dogs. He notes visible swelling over the area when looking in the mirror.  He has not had prior interventions for this hip pain.  He has chronic numbness and tingling in his feet from diabetic neuropathy, which he feels is distinct from the hip pain. He denies back pain and denies leg numbness or tingling related to the hip.  Physical Exam MUSCULOSKELETAL: Log roll of left hip without pain. Tenderness over left hip trochanteric area. Hip flexion and rotation without pain.  Results Radiology Left hip X-ray (06/18/2024): Normal (Independently interpreted)  Assessment and Plan Left trochanteric bursitis Chronic left lateral hip pain consistent with trochanteric bursitis due to soft tissue inflammation. No intra-articular hip pathology. Radiographs normal. Symptoms linked to gluteal tendons, bursa, and iliotibial band tightness. Weight and occupational activities may contribute. Not severe enough for hip arthroplasty. - Reviewed normal left hip radiographs. - Discussed diagnosis, anatomical basis, and contributing factors. - Recommended non-operative management: physical therapy, home exercises for gluteal strengthening and iliotibial band stretching. - Discussed corticosteroid injection for persistent  inflammation. - Advised weight management to reduce mechanical stress. - Reassured hip arthroplasty not indicated.  Follow-Up Instructions: No follow-ups on file.   Orders:  Orders Placed This Encounter  Procedures   XR HIP UNILAT W OR W/O PELVIS 2-3 VIEWS LEFT   No orders of the defined types were placed in this encounter.     Procedures: No procedures performed   Clinical Data: No additional findings.   Subjective: Chief Complaint  Patient presents with   Left Hip - Pain    HPI  Review of Systems  Constitutional: Negative.   HENT: Negative.    Eyes: Negative.   Respiratory: Negative.    Cardiovascular: Negative.   Gastrointestinal: Negative.   Endocrine: Negative.   Genitourinary: Negative.   Skin: Negative.   Allergic/Immunologic: Negative.   Neurological: Negative.   Hematological: Negative.   Psychiatric/Behavioral: Negative.    All other systems reviewed and are negative.    Objective: Vital Signs: There were no vitals taken for this visit.  Physical Exam Vitals and nursing note reviewed.  Constitutional:      Appearance: He is well-developed.  HENT:     Head: Normocephalic and atraumatic.  Eyes:     Pupils: Pupils are equal, round, and reactive to light.  Pulmonary:     Effort: Pulmonary effort is normal.  Abdominal:     Palpations: Abdomen is soft.  Musculoskeletal:        General: Normal range of motion.     Cervical back: Neck supple.  Skin:    General: Skin is warm.  Neurological:  Mental Status: He is alert and oriented to person, place, and time.  Psychiatric:        Behavior: Behavior normal.        Thought Content: Thought content normal.        Judgment: Judgment normal.     Ortho Exam  Specialty Comments:  No specialty comments available.  Imaging: XR HIP UNILAT W OR W/O PELVIS 2-3 VIEWS LEFT Result Date: 06/18/2024 X-rays of the pelvis show minor arthritic changes to both hip joints.    PMFS  History: Patient Active Problem List   Diagnosis Date Noted   Low serum vitamin B12 03/18/2024   History of prostate cancer 03/18/2024   Decreased libido 05/02/2023   Osteoporosis screening 03/08/2023   Chronic left hip pain 07/01/2022   Medicare annual wellness visit, subsequent 02/15/2021   Immunosuppressed status 11/10/2020   Memory loss 04/07/2020   Prostate cancer (HCC) 02/06/2020   Advanced directives, counseling/discussion 02/06/2020   Orthostatic hypotension 07/31/2019   Chronic heel pain, left 10/14/2017   Chronic gout 05/22/2017   Dyspnea 11/18/2016   BPH (benign prostatic hyperplasia) 10/05/2016   Status post liver transplant (HCC) 03/15/2016   Portal hypertensive gastropathy (HCC) 12/04/2015   Type 2 diabetes mellitus with other specified complication (HCC) 11/21/2015   Gynecomastia    CKD (chronic kidney disease) stage 3, GFR 30-59 ml/min (HCC) 11/04/2015   Depression, major, single episode, moderate (HCC) 07/09/2015   Insomnia 12/06/2014   Hypomagnesemia    Other fatigue 11/18/2014   Internal hemorrhoid 10/01/2014   Erectile dysfunction 10/01/2014   History of cirrhosis of liver 07/28/2014   (HFpEF) heart failure with preserved ejection fraction (HCC) 11/01/2013   Essential hypertension 06/04/2013   OSA on CPAP 07/11/2012   History of alcohol dependence (HCC) 07/11/2012   Severe obesity (BMI 35.0-39.9) with comorbidity (HCC) 11/16/2011   Routine general medical examination at a health care facility 09/22/2010   NEUROPATHY 05/27/2009   Dyslipidemia associated with type 2 diabetes mellitus (HCC) 02/15/2007   Allergic rhinitis 02/15/2007   GERD 02/15/2007   Past Medical History:  Diagnosis Date   Alcohol dependence (HCC) 07/11/2012   Alcoholic cirrhosis of liver with ascites (HCC) 07/2014   s./p transplant 12/2015   Allergy    Anemia    Cellulitis of left leg    Chronic diastolic heart failure (HCC) 11/01/2013   CKD (chronic kidney disease) stage 3, GFR  30-59 ml/min (HCC) 11/04/2015   Diabetes mellitus without complication (HCC)    Type II   GERD (gastroesophageal reflux disease)    Hyperlipidemia    Hypertension    Influenza A 07/31/2023   Left rib fracture 07/27/2019   Neuropathy    OSA on CPAP 07/11/2012   HST 07/2013:  AHI 39/hr.     Pneumonia due to COVID-19 virus 06/2019   Sleep apnea    Thrombocytopenia 12/15/2011    Family History  Problem Relation Age of Onset   Stroke Mother    Emphysema Mother    Hypertension Father    Heart disease Father    Emphysema Father    Lung cancer Maternal Aunt    Stroke Paternal Grandmother    Heart attack Neg Hx    Diabetes Neg Hx    Colon cancer Neg Hx    Colon polyps Neg Hx     Past Surgical History:  Procedure Laterality Date   COLONOSCOPY  08/2013   hyperplastic polyps, hemorrhoids Marianne)   ESOPHAGOGASTRODUODENOSCOPY  09/2014   portal gastropathy without varices (  Teressa)   FINGER AMPUTATION  1997   left 4th finger - radial arm saw   I & D EXTREMITY Right 10/05/2020   Procedure: 1.  Irrigation debridement of right thumb open distal phalanx fracture 2.  Repair of right thumb skin and nailbed laceration ;  Surgeon: Murrell Drivers, MD;  Location: [facility] OR;  Service: Orthopedics;  Laterality: Right;   LEFT AND RIGHT HEART CATHETERIZATION WITH CORONARY ANGIOGRAM N/A 06/10/2013   Procedure: LEFT AND RIGHT HEART CATHETERIZATION WITH CORONARY ANGIOGRAM;  Surgeon: Ozell JONETTA Fell, MD;  Location: [facility] CATH LAB;  Service: Cardiovascular;  Laterality: N/A;   LIVER TRANSPLANTATION  12/2015   alcoholic cirrhosis ([facility])   Social History   Occupational History   Occupation: Public Affairs Consultant    Comment: no work lately  Tobacco Use   Smoking status: Never   Smokeless tobacco: Former    Types: Associate Professor status: Never Used  Substance and Sexual Activity   Alcohol use: No    Alcohol/week: 0.0 standard drinks of alcohol    Comment: 4-6 drinks daily - NO ETOH SINCE  APRIL 2016   Drug use: Not Currently    Comment: remote use of marijuana in the past, has since quit   Sexual activity: Not on file        "

## 2024-07-26 ENCOUNTER — Telehealth: Payer: Self-pay

## 2024-07-26 DIAGNOSIS — E1169 Type 2 diabetes mellitus with other specified complication: Secondary | ICD-10-CM

## 2024-07-26 DIAGNOSIS — I5032 Chronic diastolic (congestive) heart failure: Secondary | ICD-10-CM

## 2024-07-26 NOTE — Telephone Encounter (Signed)
 Copied from CRM #[id number]. Topic: Clinical - Medication Question >> Jul 26, 2024  1:07 PM Deleta RAMAN wrote: Reason for CRM: patient is requesting jardiance  for a 90 day supply or over 90 days. He states pcp normally gives him this for free and would like to speak with pcp about this medication. Please contact [phone]

## 2024-07-26 NOTE — Telephone Encounter (Signed)
 Spoke with pt returning his call. States he went to pick up Jardiance  refill and was told his cost is about $300 and he can't afford that. States Dr KANDICE had it set up where pt's rx was previously free for him. Pt is asking if that can be done again. If not, he'll need rx for something else. I asked if he had a discount card or if pharmacy had one. States he doesn't but he's on his way to [facility] now and will ask if they have one when he gets there. Pt will call back to let us  know what he finds out.

## 2024-07-29 NOTE — Telephone Encounter (Addendum)
 Noted. Will await pt update. If he doesn't call by Tuesday, can we call to see if he was able to fill Jardiance ?  If remains unaffordable, will forward to pharmacy team for possible PAP [employer]).

## 2024-07-30 NOTE — Telephone Encounter (Signed)
 Called patient he was not abel to pick up due to cost. Would like to see if he could get PAP.

## 2024-07-31 ENCOUNTER — Telehealth: Payer: Self-pay | Admitting: Pharmacist

## 2024-07-31 DIAGNOSIS — E1169 Type 2 diabetes mellitus with other specified complication: Secondary | ICD-10-CM

## 2024-07-31 NOTE — Progress Notes (Signed)
" ° °  07/31/2024 Name: Colton Porter MRN: [id number] DOB: [birth year]  Subjective  No chief complaint on file.   Care Team: Primary Care Provider: Rilla Baller, MD  Reason for visit: ?  Colton Porter is a 59 y.o. male who presents today for a telephone visit with the pharmacist due to medication access concerns regarding their Jardiance . ?   Medication Access: ?  Reports that all medications are not affordable.  Jardiance  >$200 at the pharmacy in January   Prescription drug coverage: YES Payor: HU[employer] MEDICARE / Plan: HU[employer] MEDICARE HMO / Product Type: *No Product type* / .  Summary of Benefit: Rx deductible: $250 Brand Medications: $47/month (after meeting deductible)  Current Patient Assistance: No  Patient lives in a household of 2 with reported income >350%, <400% [employer]PL though is not entirely sure. Plans to check with wife. (Previously enrolled in PAP programs at a lower income).   Medicare LIS Eligible: No  - income exceeded  2025 Poverty Guidelines  Family Size  200% 250%  300%  400%  500%   1  $31,300 $39,125  $46,950  $62,600 $78,250  2  $42,300 $52,875 $63,450  $84,600 $105,750  3  $53,300 $66,625 $79,950 $106,600 $133,250  4  64,300 $80,375 $96,450 $128,600 $160,750  Programs BI Cares Inhalers BI Cares [place] BMS GSK J[employer]J NovoNordisk [employer] MedicareD: [employer] cholesterol grant   Assessment and Plan:   1. Medication Access - Hopeful he will qualify for Toujeo  Max PAP through [employer]. Patient will discuss income w wife today to determine possible eligibility for Jardiance  (or Farxiga). Seems he qualified in the past (wife worked with clinic on applications), though patient reports a higher estimated income today.  Patient will drop by clinic to retrieve PAP application for Toujeo  today and will review with wife. Also printed Doreen and Jardiance  forms for wife to review income limits.  Applications printed and placed in patient  folder H for [place] in front office.    Follow Up:  Patient provided with my direct line. Will reach out after discussing income with wife.   Manuelita FABIENE Kobs, PharmD, BCACP, CPP Clinical Pharmacist Practitioner [facility] Ph: [phone]  "

## 2024-09-13 ENCOUNTER — Ambulatory Visit: Admitting: Family Medicine

## 2024-10-25 ENCOUNTER — Other Ambulatory Visit

## 2024-10-29 ENCOUNTER — Ambulatory Visit: Admitting: Physician Assistant

## 2025-03-11 ENCOUNTER — Ambulatory Visit
